# Patient Record
Sex: Male | Born: 1950
Health system: Southern US, Community
[De-identification: ages and names within clinical notes are randomized; demographics above are authoritative.]

## PROBLEM LIST (undated history)

## (undated) DIAGNOSIS — F0390 Unspecified dementia without behavioral disturbance: Secondary | ICD-10-CM

## (undated) DIAGNOSIS — F32A Depression, unspecified: Secondary | ICD-10-CM

## (undated) DIAGNOSIS — N390 Urinary tract infection, site not specified: Secondary | ICD-10-CM

## (undated) DIAGNOSIS — F419 Anxiety disorder, unspecified: Secondary | ICD-10-CM

## (undated) DIAGNOSIS — I639 Cerebral infarction, unspecified: Secondary | ICD-10-CM

## (undated) DIAGNOSIS — W19XXXA Unspecified fall, initial encounter: Secondary | ICD-10-CM

## (undated) DIAGNOSIS — G629 Polyneuropathy, unspecified: Secondary | ICD-10-CM

## (undated) DIAGNOSIS — R7303 Prediabetes: Secondary | ICD-10-CM

## (undated) DIAGNOSIS — I219 Acute myocardial infarction, unspecified: Secondary | ICD-10-CM

## (undated) DIAGNOSIS — F329 Major depressive disorder, single episode, unspecified: Secondary | ICD-10-CM

## (undated) DIAGNOSIS — M1990 Unspecified osteoarthritis, unspecified site: Secondary | ICD-10-CM

## (undated) DIAGNOSIS — I255 Ischemic cardiomyopathy: Secondary | ICD-10-CM

## (undated) DIAGNOSIS — T7840XA Allergy, unspecified, initial encounter: Secondary | ICD-10-CM

## (undated) DIAGNOSIS — I10 Essential (primary) hypertension: Secondary | ICD-10-CM

## (undated) DIAGNOSIS — R296 Repeated falls: Secondary | ICD-10-CM

## (undated) DIAGNOSIS — I739 Peripheral vascular disease, unspecified: Secondary | ICD-10-CM

## (undated) DIAGNOSIS — I5042 Chronic combined systolic (congestive) and diastolic (congestive) heart failure: Secondary | ICD-10-CM

## (undated) DIAGNOSIS — Z720 Tobacco use: Secondary | ICD-10-CM

## (undated) DIAGNOSIS — S065X9A Traumatic subdural hemorrhage with loss of consciousness of unspecified duration, initial encounter: Secondary | ICD-10-CM

## (undated) DIAGNOSIS — S065XAA Traumatic subdural hemorrhage with loss of consciousness status unknown, initial encounter: Secondary | ICD-10-CM

## (undated) DIAGNOSIS — I2510 Atherosclerotic heart disease of native coronary artery without angina pectoris: Secondary | ICD-10-CM

## (undated) HISTORY — DX: Atherosclerotic heart disease of native coronary artery without angina pectoris: I25.10

## (undated) HISTORY — PX: KNEE SURGERY: SHX244

## (undated) HISTORY — DX: Traumatic subdural hemorrhage with loss of consciousness status unknown, initial encounter: S06.5XAA

## (undated) HISTORY — DX: Urinary tract infection, site not specified: N39.0

## (undated) HISTORY — DX: Chronic combined systolic (congestive) and diastolic (congestive) heart failure: I50.42

## (undated) HISTORY — DX: Traumatic subdural hemorrhage with loss of consciousness of unspecified duration, initial encounter: S06.5X9A

## (undated) HISTORY — DX: Ischemic cardiomyopathy: I25.5

## (undated) HISTORY — DX: Prediabetes: R73.03

## (undated) HISTORY — PX: MOUTH SURGERY: SHX715

## (undated) HISTORY — PX: APPENDECTOMY: SHX54

## (undated) HISTORY — DX: Allergy, unspecified, initial encounter: T78.40XA

## (undated) HISTORY — PX: TOE SURGERY: SHX1073

## (undated) HISTORY — DX: Peripheral vascular disease, unspecified: I73.9

## (undated) HISTORY — DX: Acute myocardial infarction, unspecified: I21.9

## (undated) HISTORY — DX: Polyneuropathy, unspecified: G62.9

---
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
## 2008-10-19 ENCOUNTER — Emergency Department (HOSPITAL_BASED_OUTPATIENT_CLINIC_OR_DEPARTMENT_OTHER): Admission: EM | Admit: 2008-10-19 | Discharge: 2008-10-19 | Payer: Self-pay | Admitting: Emergency Medicine

## 2008-10-19 IMAGING — CR DG CHEST 2V
2 series · 2 of 2 positions shown · non-contrast
Comparison: None

CLINICAL DATA: Flu-like symptoms.

CHEST - 2 VIEW

[w chest pa]
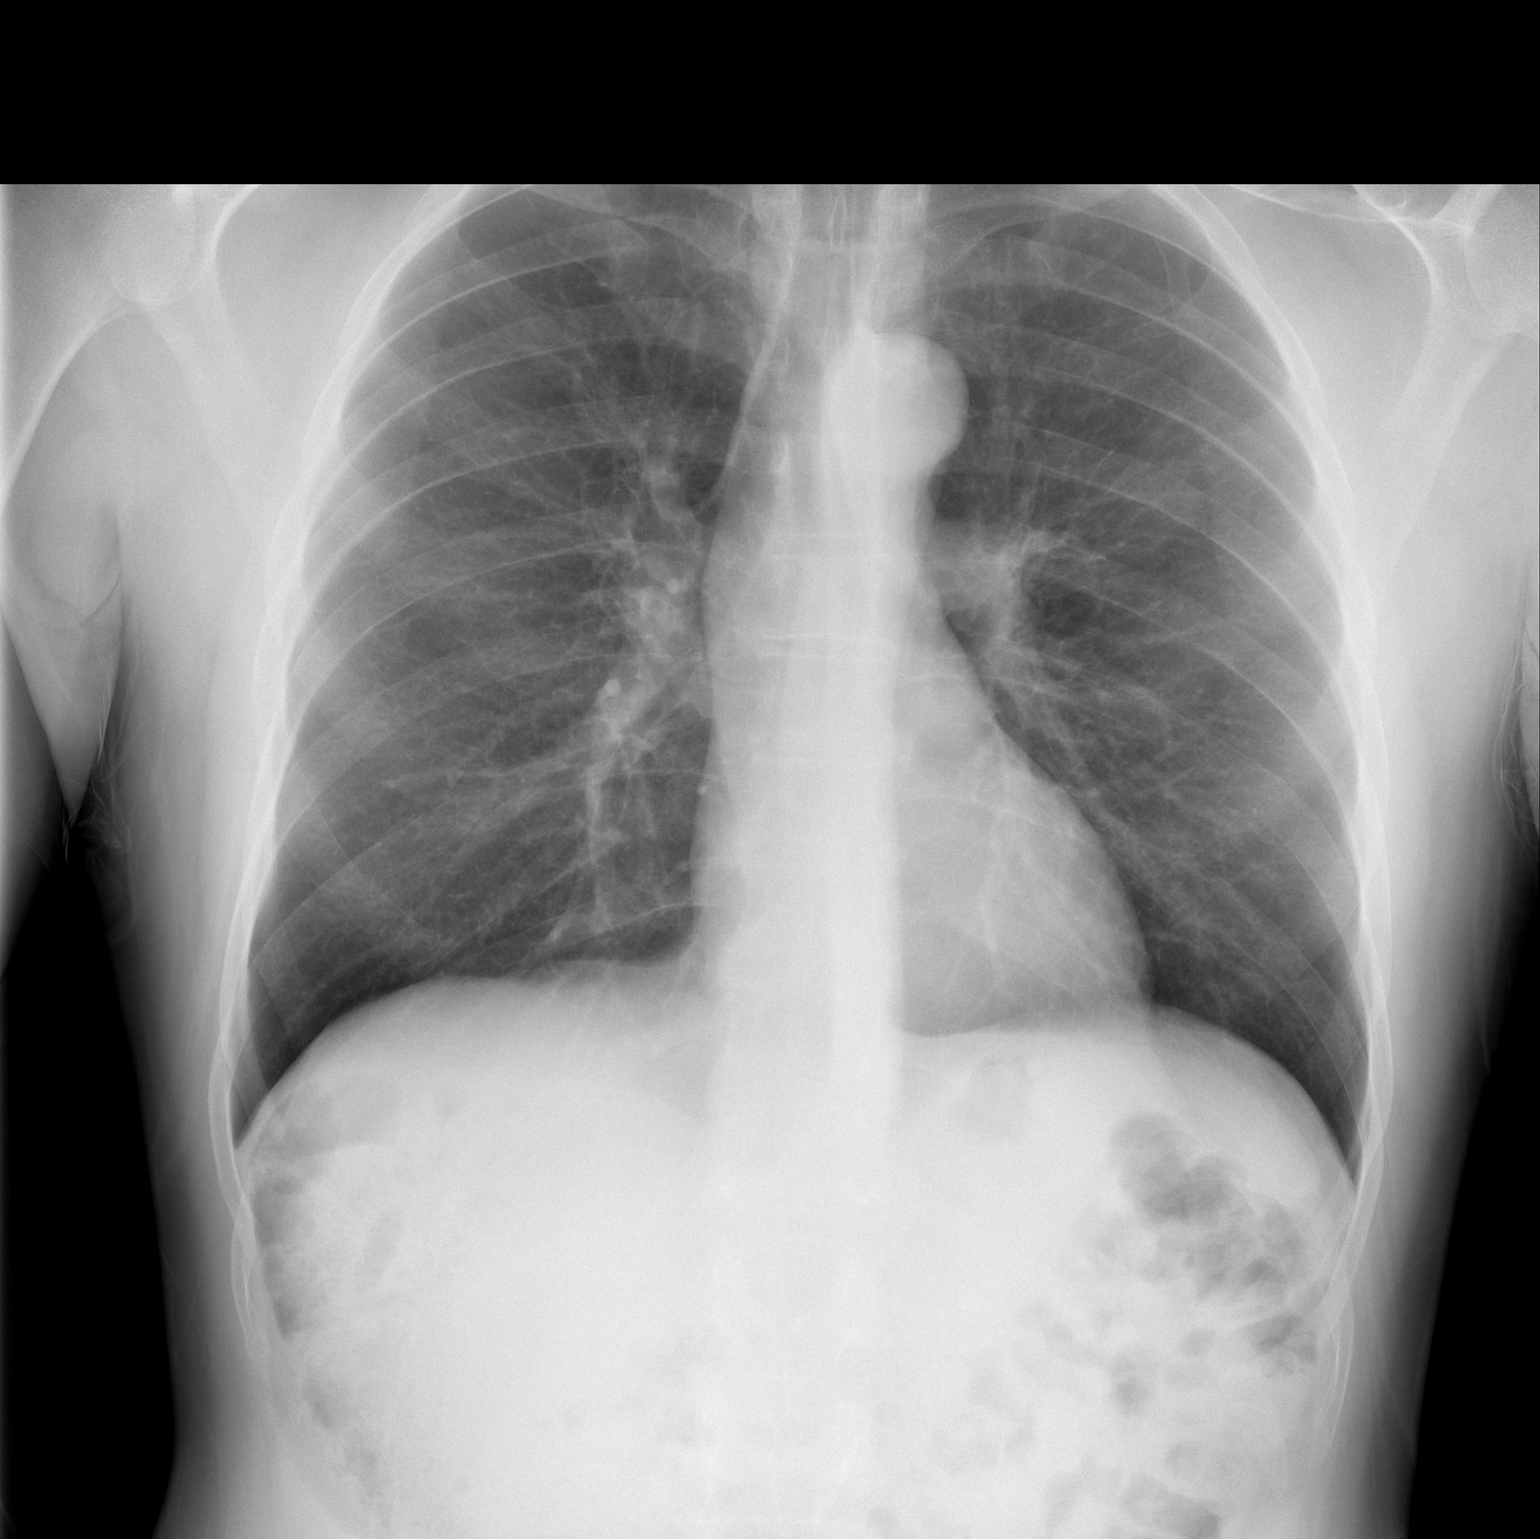

[w chest lat]
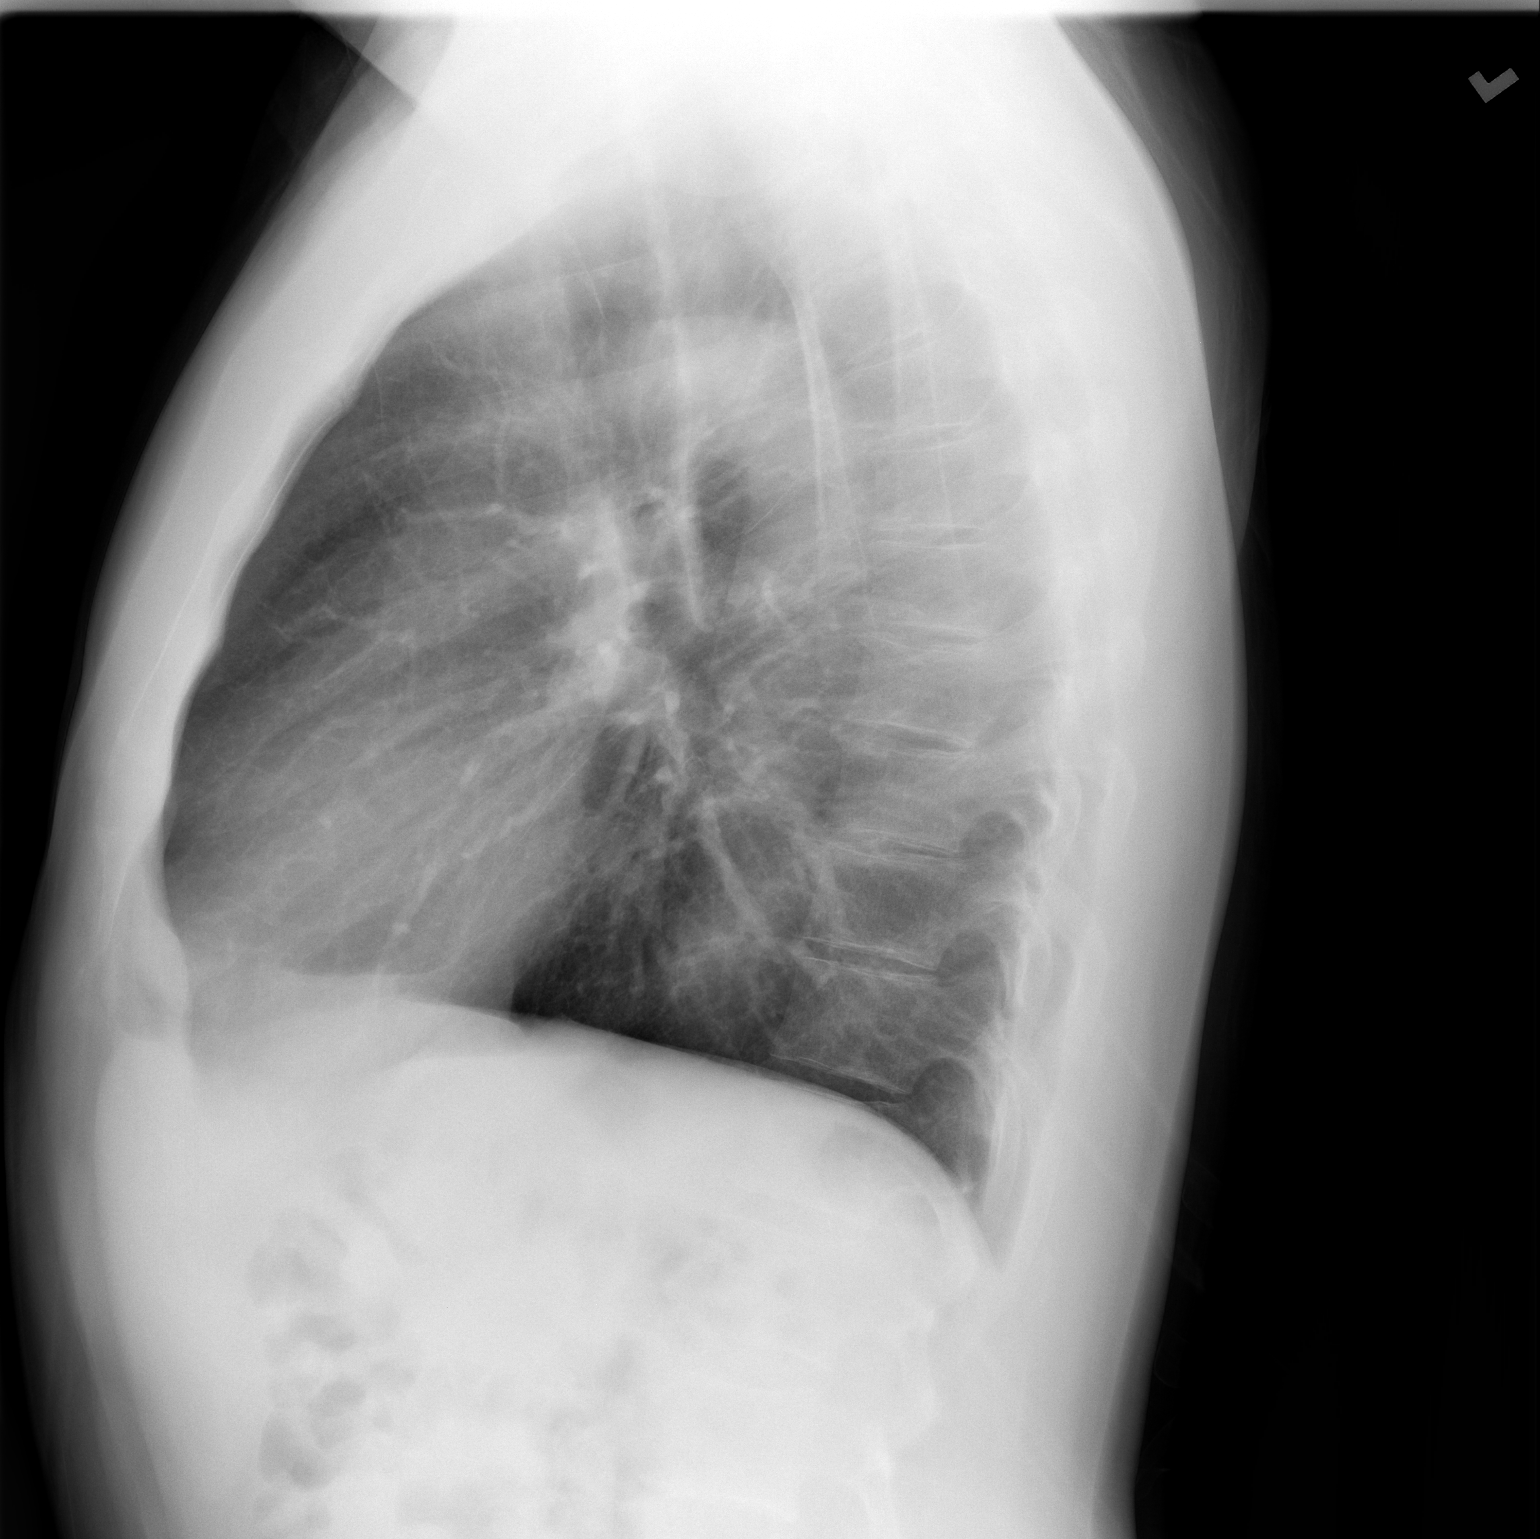

[2 of 2 positions shown; findings below may reference images not displayed]

FINDINGS: Heart and mediastinal contours are within normal limits.
No focal opacities or effusions.  No acute bony abnormality.
IMPRESSION: No active disease.

## 2010-01-25 ENCOUNTER — Encounter (INDEPENDENT_AMBULATORY_CARE_PROVIDER_SITE_OTHER): Payer: Self-pay | Admitting: Orthopedic Surgery

## 2010-01-25 ENCOUNTER — Ambulatory Visit: Payer: Self-pay | Admitting: Vascular Surgery

## 2010-01-25 ENCOUNTER — Ambulatory Visit (HOSPITAL_COMMUNITY): Admission: RE | Admit: 2010-01-25 | Discharge: 2010-01-25 | Payer: Self-pay | Admitting: Orthopedic Surgery

## 2013-10-29 ENCOUNTER — Emergency Department (HOSPITAL_COMMUNITY): Payer: 59

## 2013-10-29 ENCOUNTER — Ambulatory Visit (HOSPITAL_COMMUNITY): Admit: 2013-10-29 | Payer: Self-pay | Admitting: Cardiovascular Disease

## 2013-10-29 ENCOUNTER — Encounter (HOSPITAL_COMMUNITY): Payer: Self-pay | Admitting: Emergency Medicine

## 2013-10-29 ENCOUNTER — Encounter (HOSPITAL_COMMUNITY)
Admission: EM | Disposition: A | Discharge status: Home / Self Care | Payer: 59 | Source: Home / Self Care | Attending: Cardiovascular Disease

## 2013-10-29 ENCOUNTER — Inpatient Hospital Stay (HOSPITAL_COMMUNITY)
Admission: EM | Admit: 2013-10-29 | Discharge: 2013-11-08 | DRG: 234 | Disposition: A | Discharge status: Home / Self Care | Payer: 59 | Attending: Thoracic Surgery (Cardiothoracic Vascular Surgery) | Admitting: Thoracic Surgery (Cardiothoracic Vascular Surgery)

## 2013-10-29 DIAGNOSIS — Z79899 Other long term (current) drug therapy: Secondary | ICD-10-CM

## 2013-10-29 DIAGNOSIS — Z7982 Long term (current) use of aspirin: Secondary | ICD-10-CM

## 2013-10-29 DIAGNOSIS — D62 Acute posthemorrhagic anemia: Secondary | ICD-10-CM | POA: Diagnosis not present

## 2013-10-29 DIAGNOSIS — I10 Essential (primary) hypertension: Secondary | ICD-10-CM

## 2013-10-29 DIAGNOSIS — F17200 Nicotine dependence, unspecified, uncomplicated: Secondary | ICD-10-CM

## 2013-10-29 DIAGNOSIS — Z720 Tobacco use: Secondary | ICD-10-CM | POA: Diagnosis present

## 2013-10-29 DIAGNOSIS — I059 Rheumatic mitral valve disease, unspecified: Secondary | ICD-10-CM

## 2013-10-29 DIAGNOSIS — I2109 ST elevation (STEMI) myocardial infarction involving other coronary artery of anterior wall: Principal | ICD-10-CM

## 2013-10-29 DIAGNOSIS — I2510 Atherosclerotic heart disease of native coronary artery without angina pectoris: Secondary | ICD-10-CM

## 2013-10-29 DIAGNOSIS — I255 Ischemic cardiomyopathy: Secondary | ICD-10-CM

## 2013-10-29 DIAGNOSIS — Z951 Presence of aortocoronary bypass graft: Secondary | ICD-10-CM

## 2013-10-29 DIAGNOSIS — I213 ST elevation (STEMI) myocardial infarction of unspecified site: Secondary | ICD-10-CM

## 2013-10-29 HISTORY — DX: Essential (primary) hypertension: I10

## 2013-10-29 HISTORY — PX: LEFT HEART CATH: SHX5478

## 2013-10-29 HISTORY — DX: Tobacco use: Z72.0

## 2013-10-29 LAB — COMPREHENSIVE METABOLIC PANEL
ALT: 20 U/L (ref 0–53)
ALT: 17 U/L (ref 0–53)
AST: 35 U/L (ref 0–37)
AST: 37 U/L (ref 0–37)
Albumin: 4.2 g/dL (ref 3.5–5.2)
Albumin: 3.7 g/dL (ref 3.5–5.2)
Alkaline Phosphatase: 74 U/L (ref 39–117)
Alkaline Phosphatase: 68 U/L (ref 39–117)
BUN: 22 mg/dL (ref 6–23)
BUN: 21 mg/dL (ref 6–23)
CO2: 24 mEq/L (ref 19–32)
CO2: 25 mEq/L (ref 19–32)
Calcium: 9.6 mg/dL (ref 8.4–10.5)
Calcium: 9.4 mg/dL (ref 8.4–10.5)
Chloride: 103 mEq/L (ref 96–112)
Chloride: 101 mEq/L (ref 96–112)
Creatinine, Ser: 1.18 mg/dL (ref 0.50–1.35)
Creatinine, Ser: 1 mg/dL (ref 0.50–1.35)
GFR calc Af Amer: 76 mL/min — ABNORMAL LOW (ref >90)
GFR calc Af Amer: >90 mL/min (ref >90)
GFR calc non Af Amer: 65 mL/min — ABNORMAL LOW (ref >90)
GFR calc non Af Amer: 80 mL/min — ABNORMAL LOW (ref >90)
Glucose, Bld: 118 mg/dL — ABNORMAL HIGH (ref 70–99)
Glucose, Bld: 97 mg/dL (ref 70–99)
Potassium: 4.2 mEq/L (ref 3.5–5.1)
Potassium: 4.4 mEq/L (ref 3.5–5.1)
Sodium: 140 mEq/L (ref 135–145)
Sodium: 137 mEq/L (ref 135–145)
Total Bilirubin: 0.6 mg/dL (ref 0.3–1.2)
Total Bilirubin: 0.6 mg/dL (ref 0.3–1.2)
Total Protein: 7.7 g/dL (ref 6.0–8.3)
Total Protein: 7.1 g/dL (ref 6.0–8.3)

## 2013-10-29 LAB — POCT I-STAT, CHEM 8
BUN: 29 mg/dL — ABNORMAL HIGH (ref 6–23)
Calcium, Ion: 1.19 mmol/L (ref 1.13–1.30)
Chloride: 104 mEq/L (ref 96–112)
Creatinine, Ser: 1.3 mg/dL (ref 0.50–1.35)
Glucose, Bld: 114 mg/dL — ABNORMAL HIGH (ref 70–99)
HCT: 48 % (ref 39.0–52.0)
Hemoglobin: 16.3 g/dL (ref 13.0–17.0)
Potassium: 4.2 mEq/L (ref 3.5–5.1)
Sodium: 142 mEq/L (ref 135–145)
TCO2: 26 mmol/L (ref 0–100)

## 2013-10-29 LAB — CBC
HCT: 42.6 % (ref 39.0–52.0)
Hemoglobin: 14.5 g/dL (ref 13.0–17.0)
MCH: 28.3 pg (ref 26.0–34.0)
MCHC: 34 g/dL (ref 30.0–36.0)
MCV: 83.2 fL (ref 78.0–100.0)
Platelets: 280 10*3/uL (ref 150–400)
RBC: 5.12 MIL/uL (ref 4.22–5.81)
RDW: 13.7 % (ref 11.5–15.5)
WBC: 10.8 10*3/uL — ABNORMAL HIGH (ref 4.0–10.5)

## 2013-10-29 LAB — CBC WITH DIFFERENTIAL/PLATELET
Basophils Absolute: 0.1 10*3/uL (ref 0.0–0.1)
Basophils Relative: 1 % (ref 0–1)
Eosinophils Absolute: 0.3 10*3/uL (ref 0.0–0.7)
Eosinophils Relative: 2 % (ref 0–5)
HCT: 46.1 % (ref 39.0–52.0)
Hemoglobin: 15.6 g/dL (ref 13.0–17.0)
Lymphocytes Relative: 29 % (ref 12–46)
Lymphs Abs: 3.9 10*3/uL (ref 0.7–4.0)
MCH: 28.2 pg (ref 26.0–34.0)
MCHC: 33.8 g/dL (ref 30.0–36.0)
MCV: 83.2 fL (ref 78.0–100.0)
Monocytes Absolute: 0.7 10*3/uL (ref 0.1–1.0)
Monocytes Relative: 5 % (ref 3–12)
Neutro Abs: 8.5 10*3/uL — ABNORMAL HIGH (ref 1.7–7.7)
Neutrophils Relative %: 63 % (ref 43–77)
Platelets: 330 10*3/uL (ref 150–400)
RBC: 5.54 MIL/uL (ref 4.22–5.81)
RDW: 13.8 % (ref 11.5–15.5)
WBC: 13.4 10*3/uL — ABNORMAL HIGH (ref 4.0–10.5)

## 2013-10-29 LAB — PROTIME-INR
INR: 0.96 (ref 0.00–1.49)
INR: 1.27 (ref 0.00–1.49)
Prothrombin Time: 12.6 seconds (ref 11.6–15.2)
Prothrombin Time: 15.6 seconds — ABNORMAL HIGH (ref 11.6–15.2)

## 2013-10-29 LAB — MRSA PCR SCREENING: MRSA by PCR: NEGATIVE

## 2013-10-29 LAB — TROPONIN I
Troponin I: 1.91 ng/mL (ref <0.30)
Troponin I: 7.07 ng/mL (ref <0.30)
Troponin I: 6.75 ng/mL (ref <0.30)

## 2013-10-29 LAB — APTT: aPTT: 32 seconds (ref 24–37)

## 2013-10-29 LAB — POCT I-STAT TROPONIN I: Troponin i, poc: 0.5 ng/mL (ref 0.00–0.08)

## 2013-10-29 IMAGING — CR DG CHEST 1V PORT
2 series · 2 of 2 positions shown · non-contrast
Comparison: Chest radiograph performed [DATE]

CLINICAL DATA: Chest pain.

EXAM:
PORTABLE CHEST - 1 VIEW

[view not recorded (1 of 2)]
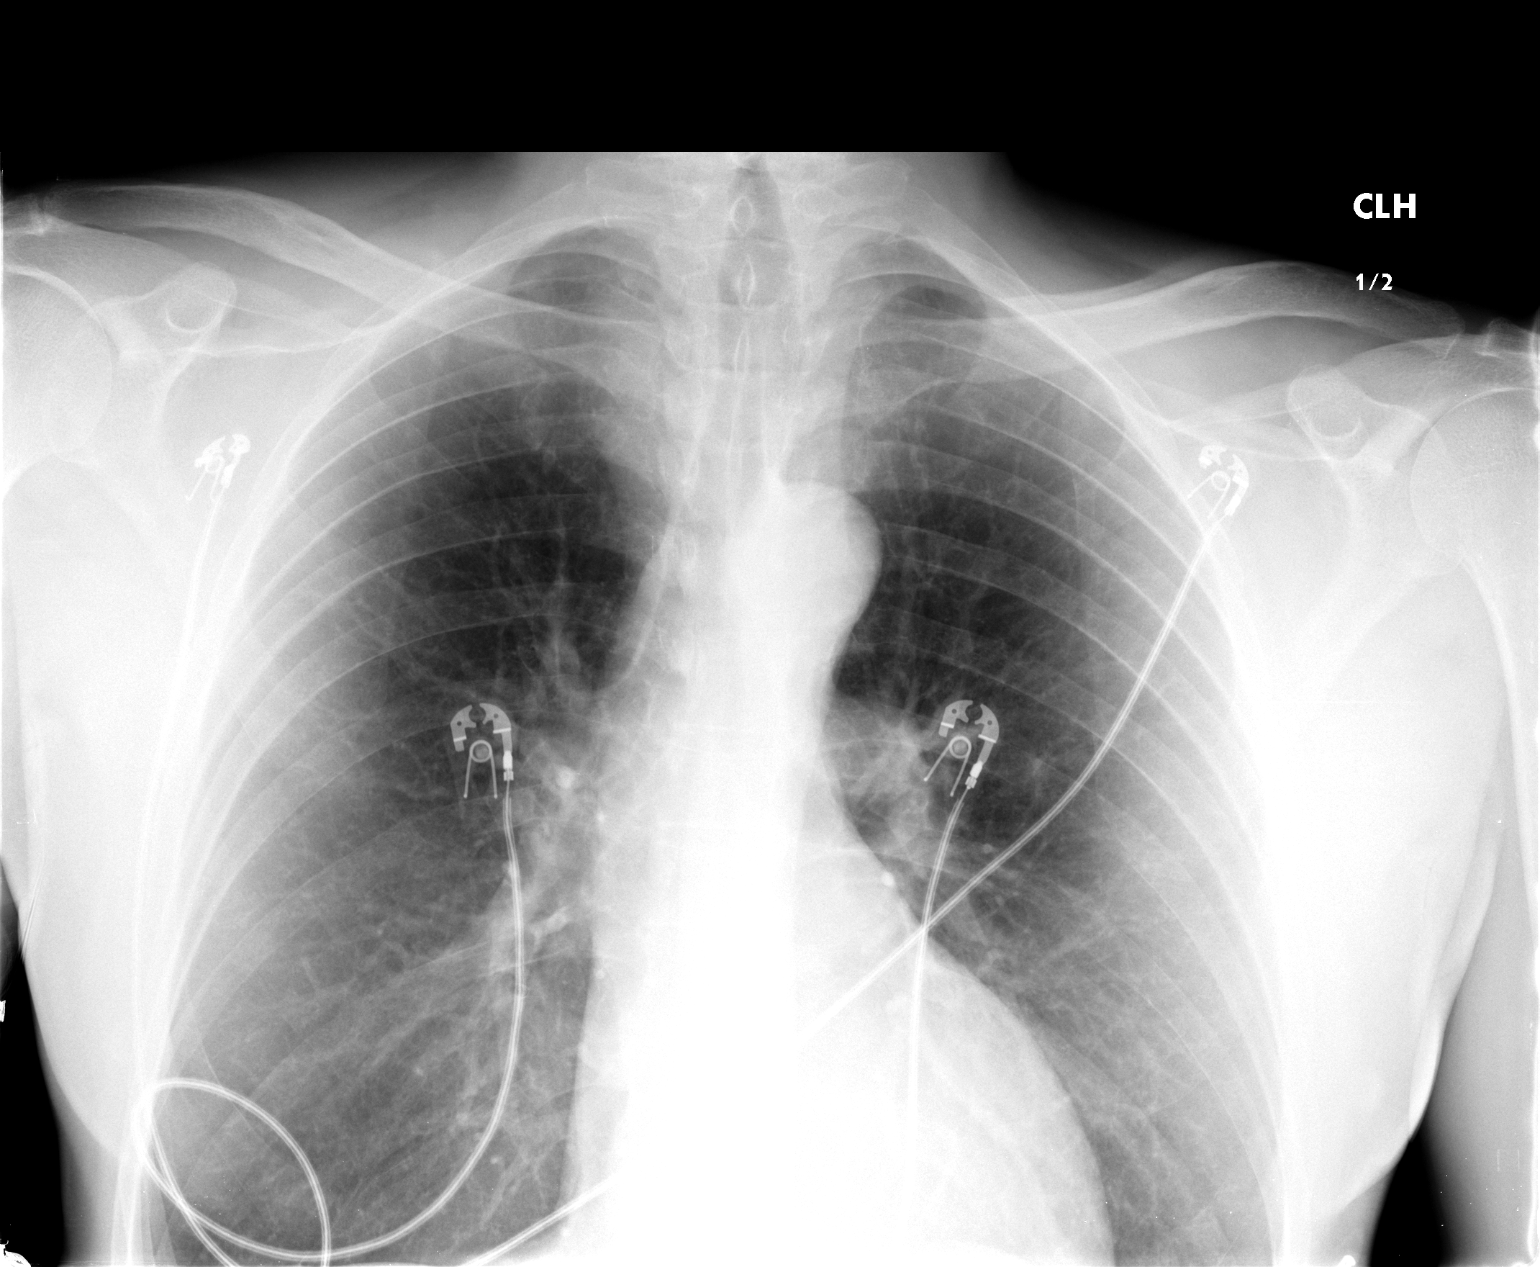

[view not recorded (2 of 2)]
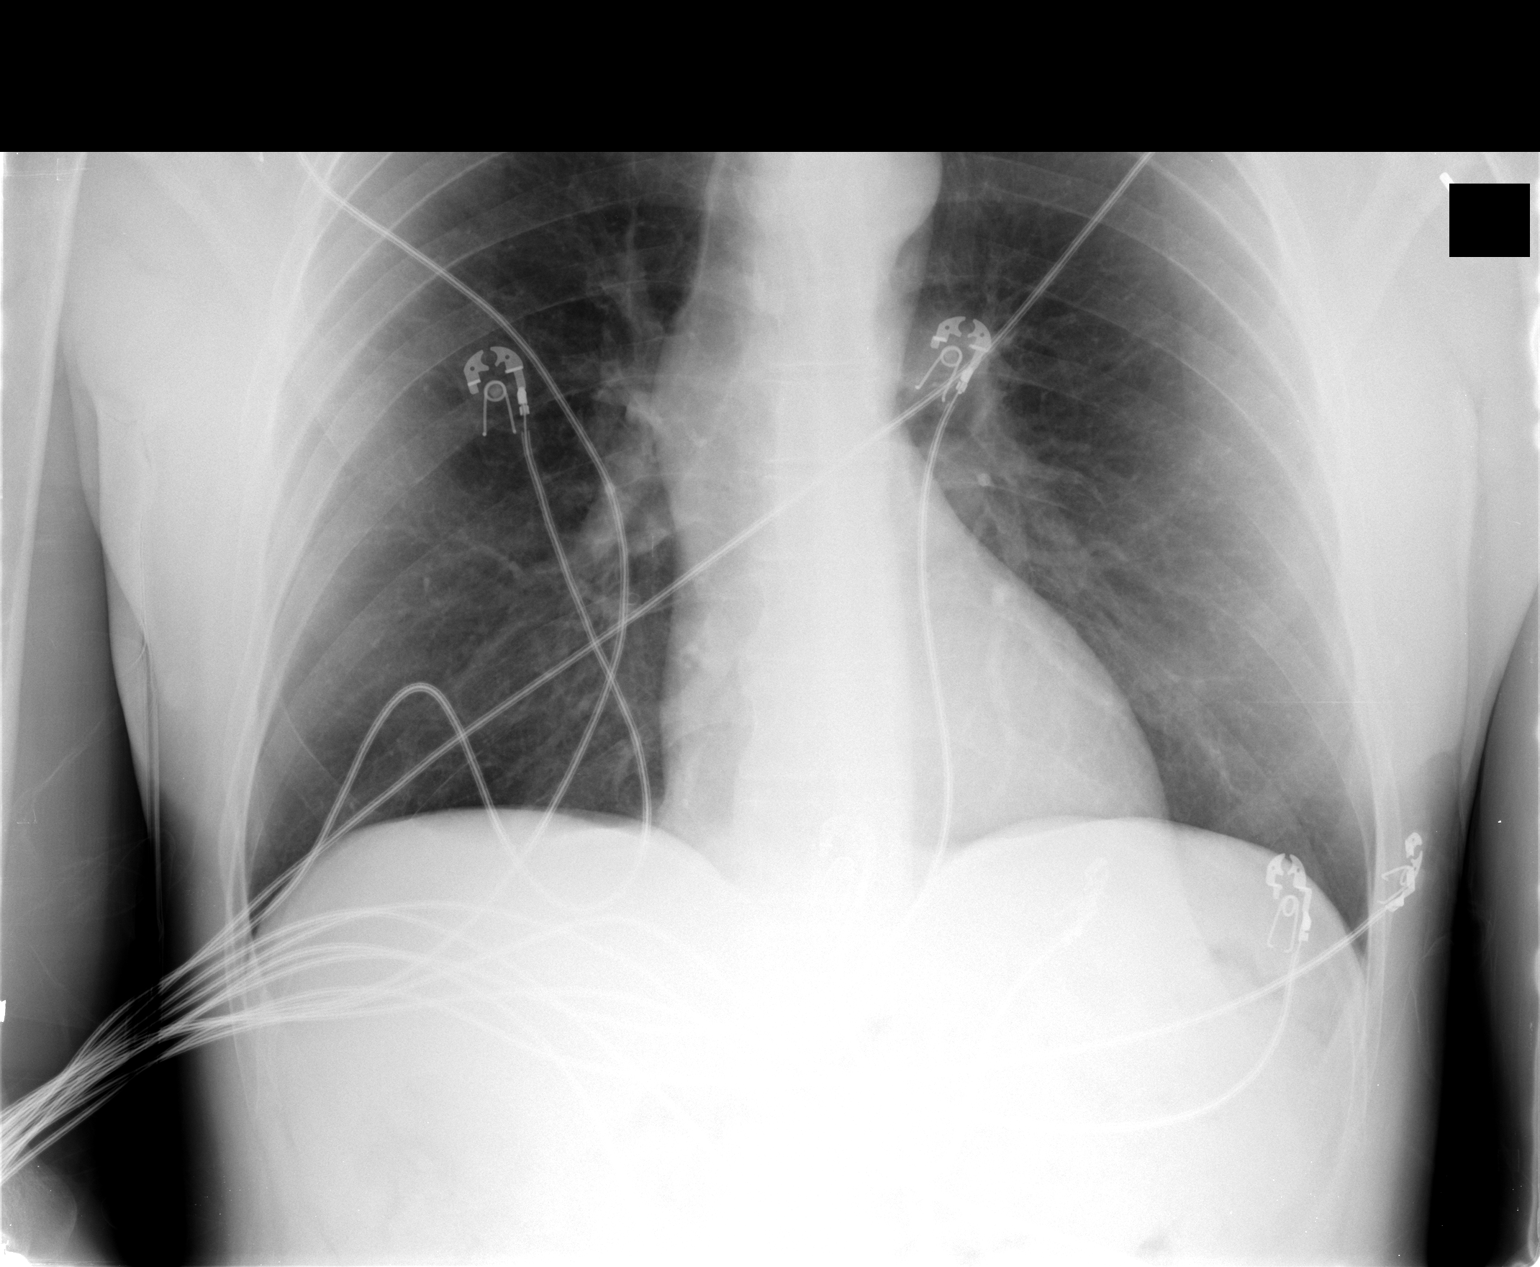

[2 of 2 positions shown; findings below may reference images not displayed]

FINDINGS: The lungs are well-aerated and clear. There is no evidence of focal
opacification, pleural effusion or pneumothorax.

The cardiomediastinal silhouette is within normal limits. No acute
osseous abnormalities are seen.
IMPRESSION: No acute cardiopulmonary process seen.

## 2013-10-29 SURGERY — LEFT HEART CATH
Anesthesia: LOCAL

## 2013-10-29 MED ORDER — HEPARIN SODIUM (PORCINE) 5000 UNIT/ML IJ SOLN
60.0000 [IU]/kg | INTRAMUSCULAR | Status: AC
  Administered 2013-10-29: 4800 [IU] via INTRAVENOUS

## 2013-10-29 MED ORDER — MIDAZOLAM HCL 2 MG/2ML IJ SOLN
INTRAMUSCULAR | Status: AC
  Filled 2013-10-29: qty 2

## 2013-10-29 MED ORDER — HEPARIN (PORCINE) IN NACL 2-0.9 UNIT/ML-% IJ SOLN
INTRAMUSCULAR | Status: AC
  Filled 2013-10-29: qty 1000

## 2013-10-29 MED ORDER — LIDOCAINE HCL (PF) 1 % IJ SOLN
INTRAMUSCULAR | Status: AC
  Filled 2013-10-29: qty 30

## 2013-10-29 MED ORDER — NITROGLYCERIN 0.4 MG SL SUBL
0.4000 mg | SUBLINGUAL_TABLET | SUBLINGUAL | Status: DC | PRN
  Administered 2013-10-31 (×3): 0.4 mg via SUBLINGUAL
  Filled 2013-10-29: qty 25

## 2013-10-29 MED ORDER — SODIUM CHLORIDE 0.9 % IV SOLN
INTRAVENOUS | Status: DC
  Administered 2013-10-29: 06:00:00 via INTRAVENOUS

## 2013-10-29 MED ORDER — FENTANYL CITRATE 0.05 MG/ML IJ SOLN
INTRAMUSCULAR | Status: AC
  Filled 2013-10-29: qty 2

## 2013-10-29 MED ORDER — ASPIRIN 81 MG PO CHEW
81.0000 mg | CHEWABLE_TABLET | Freq: Every day | ORAL | Status: DC
  Administered 2013-10-30 – 2013-11-04 (×6): 81 mg via ORAL
  Filled 2013-10-29 (×6): qty 1

## 2013-10-29 MED ORDER — NITROGLYCERIN IN D5W 200-5 MCG/ML-% IV SOLN
2.0000 ug/min | INTRAVENOUS | Status: DC
  Administered 2013-11-03: 5 ug/min via INTRAVENOUS
  Filled 2013-10-29: qty 250

## 2013-10-29 MED ORDER — MORPHINE SULFATE 2 MG/ML IJ SOLN
2.0000 mg | Freq: Once | INTRAMUSCULAR | Status: AC
  Administered 2013-10-29: 2 mg via INTRAVENOUS

## 2013-10-29 MED ORDER — ONDANSETRON HCL 4 MG/2ML IJ SOLN
4.0000 mg | Freq: Once | INTRAMUSCULAR | Status: AC
  Administered 2013-10-29: 4 mg via INTRAVENOUS

## 2013-10-29 MED ORDER — NITROGLYCERIN 0.2 MG/ML ON CALL CATH LAB
INTRAVENOUS | Status: AC
  Filled 2013-10-29: qty 1

## 2013-10-29 MED ORDER — ASPIRIN 81 MG PO CHEW
324.0000 mg | CHEWABLE_TABLET | Freq: Once | ORAL | Status: AC
  Administered 2013-10-29: 324 mg via ORAL

## 2013-10-29 MED ORDER — HEPARIN SODIUM (PORCINE) 1000 UNIT/ML IJ SOLN
INTRAMUSCULAR | Status: AC
  Filled 2013-10-29: qty 1

## 2013-10-29 MED ORDER — VERAPAMIL HCL 2.5 MG/ML IV SOLN
INTRAVENOUS | Status: AC
  Filled 2013-10-29: qty 2

## 2013-10-29 MED ORDER — ONDANSETRON HCL 4 MG/2ML IJ SOLN: 4.0000 mg | Freq: Four times a day (QID) | INTRAMUSCULAR | Status: DC | PRN

## 2013-10-29 MED ORDER — ACETAMINOPHEN 325 MG PO TABS: 650.0000 mg | ORAL_TABLET | ORAL | Status: DC | PRN

## 2013-10-29 MED ORDER — METOPROLOL TARTRATE 25 MG PO TABS
25.0000 mg | ORAL_TABLET | Freq: Two times a day (BID) | ORAL | Status: DC
  Filled 2013-10-29 (×2): qty 1

## 2013-10-29 MED ORDER — SODIUM CHLORIDE 0.9 % IV SOLN: INTRAVENOUS | Status: AC

## 2013-10-29 MED ORDER — MORPHINE SULFATE 2 MG/ML IJ SOLN: 2.0000 mg | INTRAMUSCULAR | Status: DC | PRN

## 2013-10-29 MED ORDER — CLOPIDOGREL BISULFATE 300 MG PO TABS
600.0000 mg | ORAL_TABLET | Freq: Once | ORAL | Status: AC
  Administered 2013-10-29: 600 mg via ORAL
  Filled 2013-10-29: qty 2

## 2013-10-29 MED ORDER — ATORVASTATIN CALCIUM 80 MG PO TABS
80.0000 mg | ORAL_TABLET | Freq: Every day | ORAL | Status: DC
  Administered 2013-10-29 – 2013-11-03 (×6): 80 mg via ORAL
  Filled 2013-10-29 (×7): qty 1

## 2013-10-29 MED ORDER — CLOPIDOGREL BISULFATE 75 MG PO TABS
75.0000 mg | ORAL_TABLET | Freq: Every day | ORAL | Status: DC
  Administered 2013-10-30 – 2013-10-31 (×2): 75 mg via ORAL
  Filled 2013-10-29 (×3): qty 1

## 2013-10-29 MED ORDER — NITROGLYCERIN IN D5W 200-5 MCG/ML-% IV SOLN
5.0000 ug/min | Freq: Once | INTRAVENOUS | Status: AC
  Administered 2013-10-29: 5 ug/min via INTRAVENOUS

## 2013-10-29 MED ORDER — BIVALIRUDIN 250 MG IV SOLR
INTRAVENOUS | Status: AC
  Filled 2013-10-29: qty 250

## 2013-10-29 MED ORDER — OXYCODONE-ACETAMINOPHEN 5-325 MG PO TABS
1.0000 | ORAL_TABLET | ORAL | Status: DC | PRN
  Administered 2013-10-29 – 2013-11-01 (×4): 2 via ORAL
  Filled 2013-10-29 (×5): qty 2

## 2013-10-29 NOTE — H&P (Signed)
     Patient ID: Lonnie [name] MRN: [id number] DOB/AGE: [birth year] 60 y.o. Admit date: 10/29/2013  Primary Care Physician: None Primary Cardiologist: None  HPI: 62 yo AAM with history of HTN and long time tobacco abuse with onset of chest pain this am at 5 am. Severe substernal pain, SOB. To ED and EKG with anterior ST elevation. Pt given NTG, ASA, Zofran and IV heparin. Chest pain now 1/10.   Review of systems complete and found to be negative unless listed above   Past Medical History  Diagnosis Date  . Hypertension   . Tobacco abuse     Family History  Problem Relation Age of Onset  . CAD Neg Hx   . Cardiomyopathy Daughter     History   Social History  . Marital Status: Widowed    Spouse Name: N/A    Number of Children: 1  . Years of Education: N/A   Occupational History  . Photographer   Social History Main Topics  . Smoking status: Current Every Day Smoker -- 1.00 packs/day for 42 years    Types: Cigarettes  . Smokeless tobacco: Not on file  . Alcohol Use: No  . Drug Use: No  . Sexual Activity: Not on file   Other Topics Concern  . Not on file   Social History Narrative  . No narrative on file    Past Surgical History  Procedure Laterality Date  . Toe surgery    . Appendectomy    . Knee surgery    . Mouth surgery      No Known Allergies  Prior to Admission Meds: BP med he cant remember but hasn't been taking  Physical Exam: Blood pressure 144/102, pulse 80, resp. rate 11, height 6\' 2"  (1.88 m), weight 175 lb (79.379 kg), SpO2 100.00%.   General: Well developed, well nourished, NAD  HEENT: OP clear, mucus membranes moist  SKIN: warm, dry. No rashes.  Neuro: No focal deficits  Musculoskeletal: Muscle strength 5/5 all ext  Psychiatric: Mood and affect normal  Neck: No JVD, no carotid bruits, no thyromegaly, no lymphadenopathy.  Lungs:Clear bilaterally, no wheezes, rhonci, crackles  Cardiovascular: Regular rate and rhythm. No murmurs,  gallops or rubs.  Abdomen:Soft. Bowel sounds present. Non-tender.  Extremities: No lower extremity edema. Pulses are 2 + in the bilateral DP/PT.   Labs:   Lab Results  Component Value Date   HGB 16.3 10/29/2013   HCT 48.0 10/29/2013     Recent Labs Lab 10/29/13 0606  NA 142  K 4.2  CL 104  BUN 29*  CREATININE 1.30  GLUCOSE 114*   EKG: Sinus, ST elevation anteriorly  ASSESSMENT AND PLAN:   1. Anterior STEMI: Plan for emergent cardiac cath with PCI. Further plans to follow.   Earney Hamburg, MD 10/29/2013, 6:33 AM

## 2013-10-29 NOTE — Progress Notes (Signed)
Pt reporting chest pain 2/10 that is similar in nature to the pain that brought him to ER. NTG increased to 18mcg/min. PA paged to report frequent runs of bigeminy, HR dropping to upper 40's, presence of CP.

## 2013-10-29 NOTE — ED Notes (Signed)
The pt has had mid-chest pain since 0500am.  No meds taken

## 2013-10-29 NOTE — Interval H&P Note (Signed)
History and Physical Interval Note:  10/29/2013 6:38 AM  Lonnie [name]  has presented today for cardiac cath with anterior STEMI. The various methods of treatment have been discussed with the patient and family. After consideration of risks, benefits and other options for treatment, the patient has consented to  Procedure(s): LEFT HEART CATH (N/A) as a surgical intervention .  The patient's history has been reviewed, patient examined, no change in status, stable for surgery.  I have reviewed the patient's chart and labs.  Questions were answered to the patient's satisfaction.    Cath Lab Visit (complete for each Cath Lab visit)  Clinical Evaluation Leading to the Procedure:   ACS: yes  Non-ACS:    Anginal Classification: CCS IV  Anti-ischemic medical therapy: No Therapy  Non-Invasive Test Results: No non-invasive testing performed  Prior CABG: No previous CABG        MCALHANY,CHRISTOPHER

## 2013-10-29 NOTE — ED Provider Notes (Signed)
CSN: [id number]     Arrival date & time 10/29/13  0544 History   First MD Initiated Contact with Patient 10/29/13 0559     Chief Complaint  Patient presents with  . Chest Pain   (Consider location/radiation/quality/duration/timing/severity/associated sxs/prior Treatment) HPI Comments: 62 year old male with a history of hypertension and a current smoker who presents with a complaint of chest pain. The pain is located in the mid sternum area, is a pressure and heaviness on his chest that started at 5:00 AM. This was acute in onset, persistent, nothing seems to make it better or worse, no associated shortness of breath, no radiation of the pain, no nausea vomiting or diaphoresis. He has no known cardiac history and states that he has not been taking his antihypertensive the last few days. He does work as an Buyer, retail but states that he always takes an aspirin and has not been having any shortness of breath or swelling of his legs.  Patient is a 62 y.o. male presenting with chest pain. The history is provided by the patient and the spouse.  Chest Pain   Past Medical History  Diagnosis Date  . Hypertension    History reviewed. No pertinent past surgical history. No family history on file. History  Substance Use Topics  . Smoking status: Current Every Day Smoker  . Smokeless tobacco: Not on file  . Alcohol Use: No    Review of Systems  Cardiovascular: Positive for chest pain.  All other systems reviewed and are negative.    Allergies  Review of patient's allergies indicates no known allergies.  Home Medications  No current outpatient prescriptions on file. Ht 6\' 2"  (1.88 m)  Wt 175 lb (79.379 kg)  BMI 22.46 kg/m2 Physical Exam  Nursing note and vitals reviewed. Constitutional: He appears well-developed and well-nourished. He appears distressed.  HENT:  Head: Normocephalic and atraumatic.  Mouth/Throat: Oropharynx is clear and moist. No oropharyngeal exudate.  Eyes:  Conjunctivae and EOM are normal. Pupils are equal, round, and reactive to light. Right eye exhibits no discharge. Left eye exhibits no discharge. No scleral icterus.  Neck: Normal range of motion. Neck supple. No JVD present. No thyromegaly present.  Cardiovascular: Normal rate, regular rhythm, normal heart sounds and intact distal pulses.  Exam reveals no gallop and no friction rub.   No murmur heard. Pulmonary/Chest: Effort normal and breath sounds normal. No respiratory distress. He has no wheezes. He has no rales.  Abdominal: Soft. Bowel sounds are normal. He exhibits no distension and no mass. There is no tenderness.  Musculoskeletal: Normal range of motion. He exhibits no edema and no tenderness.  Lymphadenopathy:    He has no cervical adenopathy.  Neurological: He is alert. Coordination normal.  Skin: Skin is warm and dry. No rash noted. No erythema.  Psychiatric: He has a normal mood and affect. His behavior is normal.    ED Course  Procedures (including critical care time) Labs Review Labs Reviewed  POCT I-STAT TROPONIN I - Abnormal; Notable for the following:    Troponin i, poc 0.50 (*)    All other components within normal limits  COMPREHENSIVE METABOLIC PANEL  CBC WITH DIFFERENTIAL  APTT  PROTIME-INR   Imaging Review No results found.  ED ECG REPORT  I personally interpreted this EKG   Date: 10/29/2013 05:48  Rate: 96  Rhythm: normal sinus rhythm  QRS Axis: normal  Intervals: normal  ST/T Wave abnormalities: ST elevations in leads V4 and V5, ST depressions inferior  Conduction Disutrbances:none  Narrative Interpretation:   Old EKG Reviewed: none available  0607 EKG Interpretation     Ventricular Rate:  91 PR Interval:  190 QRS Duration: 93 QT Interval:  391 QTC Calculation: 481 R Axis:   7 Text Interpretation:  Sinus rhythm Probable left atrial enlargement LVH with secondary repolarization abnormality Anterior infarct, old ST depr, consider ischemia,  inferior leads Artifact in lead(s) I III aVR aVL aVF            MDM   1. STEMI (ST elevation myocardial infarction)    The patient's EKG reveals ST elevations in leads V4 and V5 with Q waves V1 V2 V3 as well as ST depression in the inferior leads. With the patient's symptoms of a pressure in his substernal area and these EKG findings I have activated A. ST elevation MI protocol and have discussed the care with Dr. Clifton James of cardiology. He is on the way to see the pt at this time.    Nitroglycerin drip, heparin bolus with a drip, aspirin, chest x-ray, labs ordered.  The patient reexamined and has ongoing pain  Troponin is elevated at 0.5 consistent with myocardial infarction  Meds given in ED:  Medications  0.9 %  sodium chloride infusion ( Intravenous New Bag/Given 10/29/13 0611)  aspirin chewable tablet 324 mg (324 mg Oral Given 10/29/13 0611)  heparin injection 60 Units/kg (4,800 Units Intravenous Given 10/29/13 0603)  morphine 2 MG/ML injection 2 mg (2 mg Intravenous Given 10/29/13 0611)  ondansetron (ZOFRAN) injection 4 mg (4 mg Intravenous Given 10/29/13 0611)  nitroGLYCERIN 0.2 mg/mL in dextrose 5 % infusion (5 mcg/min Intravenous New Bag/Given 10/29/13 1610)     CRITICAL CARE Performed by: Eber Hong D Total critical care time: 30 Critical care time was exclusive of separately billable procedures and treating other patients. Critical care was necessary to treat or prevent imminent or life-threatening deterioration. Critical care was time spent personally by me on the following activities: development of treatment plan with patient and/or surrogate as well as nursing, discussions with consultants, evaluation of patient's response to treatment, examination of patient, obtaining history from patient or surrogate, ordering and performing treatments and interventions, ordering and review of laboratory studies, ordering and review of radiographic studies, pulse oximetry and  re-evaluation of patient's condition.    Vida Roller, MD 10/29/13 [phone]

## 2013-10-29 NOTE — CV Procedure (Addendum)
Cardiac Catheterization Operative Report  Lonnie [name] [id number] 11/8/20147:42 AM Provider Not In System  Procedure Performed:  1. Left Heart Catheterization 2. Selective Coronary Angiography 3. Left ventricular angiogram 4. Angioseal right femoral artery  Operator: Verne Carrow, MD  Indication: 62 yo male with history of HTN, tobacco abuse presented to ED with substernal chest pain x 1 hour. EKG with Q waves precordial leads with ST segment elevation in leads V3, V4, V5.                                     Procedure Details: The risks, benefits, complications, treatment options, and expected outcomes were discussed with the patient. The patient and/or family concurred with the proposed plan, giving informed consent. The patient was brought to the cath lab from the ED. The patient was further sedated with Versed and Fentanyl. I attempted access in the right radial but there was spasm and I could not engage the right radial artery.The right groin was prepped and draped in the usual manner. Using the modified Seldinger access technique, a 6 French sheath was placed in the right femoral artery. Standard diagnostic catheters were used to perform selective coronary angiography. The LAD was seen to have intense vasospasm in the mid segment. This resolved with IC NTG. The diagonal branch had 90% ostial disease (moderate caliber vessel) with TIMI-3 flow. There was no clear culprit for his presentation. The ostium of the diagonal branch has an aneurysmal segment just before the stenosis. It was unclear if there could possibly be a small occluded sub-branch of the diagonal. I thought it was worthwhile to explore this area further. The patient was chest pain free at this time. He was given a weight based bolus of Angiomax and a drip was started. The left main had been engaged with a XB LAD 3.5 guiding catheter. When the ACT was over 200, I passed a a Cougar IC wire into the diagonal  branch. I then passed a second Cougar IC wire into the LAD. I then attempted to use a Whisper wire to cross into the potential sub-branch but there was no lumen of an inferior branch nor any distal late filling to suggest an occluded vessel. I did not think stenting the diagonal branch would be the appropriate therapy for this patient. There was excellent flow down all vessels. A pigtail catheter was used to perform a left ventricular angiogram.  There were no immediate complications. The patient was taken to the recovery area in stable condition.   Hemodynamic Findings: Central aortic pressure: 117/64 Left ventricular pressure: 124/3/4  Angiographic Findings:  Left main: 50-60% distal stenosis.   Left Anterior Descending Artery: Large caliber vessel that courses to the apex. The proximal vessel has diffuse 40% stenosis. The mid vessel has diffuse 40% stenosis. The distal vessel has mild plaque disease. The first diagonal branch is small in caliber with 40% mid stenosis. The second diagonal branch is moderate in caliber with proximal aneurysmal segment followed by 80-90% stenosis. There is good flow down the second Diagonal branch. Of note, there was intense vasospasm of the proximal LAD, mid LAD and Diagonal branch noted during the catheterization which resolved with IC NTG.   Circumflex Artery: Moderate caliber vessel with moderate caliber intermediate branch. Mild plaque in the proximal segments of both vessels.   Right Coronary Artery: Moderate caliber dominant vessel with diffuse 60-70% mid stenosis, diffuse  40% distal stenosis.   Left Ventricular Angiogram: LVEF=60% with subtle hypokinesis of the anteroapical segment.   Impression: 1. Moderate non-obstructive CAD 2. Vasospasm of the LAD 3. Acute coronary syndrome secondary to vasospasm' 4. Preserved LV systolic function.   Recommendations: Will treat with ASA, statin, beta blocker. Will load with Plavix 600 mg po x 1 and start Plavix  75 mg po Qdaily. IV NTG today then convert to long acting nitrate tomorrow. Echo this weekend.        Complications:  None. The patient tolerated the procedure well.

## 2013-10-29 NOTE — ED Notes (Signed)
Cardiologist in room with pt 

## 2013-10-29 NOTE — ED Notes (Signed)
Pt states his pain started around 0500 this morning and awoke him from his sleep.  Pt stated his pain was a 7 upon arrival, was reduced to a 4 prior to giving 2mg  of morphine and is currently rated at a 1

## 2013-10-29 NOTE — Progress Notes (Signed)
Echocardiogram 2D Echocardiogram has been performed.  Dorothey Baseman 10/29/2013, 9:58 AM

## 2013-10-30 DIAGNOSIS — I2589 Other forms of chronic ischemic heart disease: Secondary | ICD-10-CM

## 2013-10-30 DIAGNOSIS — I219 Acute myocardial infarction, unspecified: Secondary | ICD-10-CM

## 2013-10-30 LAB — CBC
HCT: 41.5 % (ref 39.0–52.0)
Hemoglobin: 13.7 g/dL (ref 13.0–17.0)
MCH: 27.3 pg (ref 26.0–34.0)
MCHC: 33 g/dL (ref 30.0–36.0)
MCV: 82.7 fL (ref 78.0–100.0)
Platelets: 269 10*3/uL (ref 150–400)
RBC: 5.02 MIL/uL (ref 4.22–5.81)
RDW: 13.9 % (ref 11.5–15.5)
WBC: 9.2 10*3/uL (ref 4.0–10.5)

## 2013-10-30 LAB — LIPID PANEL
Cholesterol: 152 mg/dL (ref 0–200)
HDL: 43 mg/dL (ref >39)
LDL Cholesterol: 94 mg/dL (ref 0–99)
Total CHOL/HDL Ratio: 3.5 RATIO
Triglycerides: 76 mg/dL (ref <150)
VLDL: 15 mg/dL (ref 0–40)

## 2013-10-30 LAB — BASIC METABOLIC PANEL
BUN: 16 mg/dL (ref 6–23)
CO2: 22 mEq/L (ref 19–32)
Calcium: 9.1 mg/dL (ref 8.4–10.5)
Chloride: 105 mEq/L (ref 96–112)
Creatinine, Ser: 0.88 mg/dL (ref 0.50–1.35)
GFR calc Af Amer: >90 mL/min (ref >90)
GFR calc non Af Amer: >90 mL/min (ref >90)
Glucose, Bld: 94 mg/dL (ref 70–99)
Potassium: 3.8 mEq/L (ref 3.5–5.1)
Sodium: 140 mEq/L (ref 135–145)

## 2013-10-30 MED ORDER — ISOSORBIDE MONONITRATE ER 30 MG PO TB24
30.0000 mg | ORAL_TABLET | Freq: Every day | ORAL | Status: DC
  Administered 2013-10-30 – 2013-11-04 (×6): 30 mg via ORAL
  Filled 2013-10-30 (×6): qty 1

## 2013-10-30 MED ORDER — CARVEDILOL 3.125 MG PO TABS
3.1250 mg | ORAL_TABLET | Freq: Two times a day (BID) | ORAL | Status: DC
  Administered 2013-10-31 – 2013-11-04 (×9): 3.125 mg via ORAL
  Filled 2013-10-30 (×13): qty 1

## 2013-10-30 MED ORDER — LISINOPRIL 5 MG PO TABS
5.0000 mg | ORAL_TABLET | Freq: Every day | ORAL | Status: DC
  Administered 2013-10-30 – 2013-11-04 (×6): 5 mg via ORAL
  Filled 2013-10-30 (×6): qty 1

## 2013-10-30 NOTE — Progress Notes (Signed)
SUBJECTIVE: Pt had some left mid-axillary musculoskeletal pain, which he says is due to the position he slept in. He currently denies chest pain, shortness of breath, lightheadedness, and palpitations. EF 40-45%. Cath showed moderate nonobstructive disease with severe LAD and diagonal vasospasm. Currently on nitro drip. Beta blocker held due to bradycardia.     Intake/Output Summary (Last 24 hours) at 10/30/13 0903 Last data filed at 10/30/13 0800  Gross per 24 hour  Intake 1132.36 ml  Output   1700 ml  Net -567.64 ml    Current Facility-Administered Medications  Medication Dose Route Frequency Provider Last Rate Last Dose  . 0.9 %  sodium chloride infusion   Intravenous Continuous Vida Roller, MD 10 mL/hr at 10/29/13 1240 10 mL/hr at 10/29/13 1240  . acetaminophen (TYLENOL) tablet 650 mg  650 mg Oral Q4H PRN Kathleene Hazel, MD      . aspirin chewable tablet 81 mg  81 mg Oral Daily Kathleene Hazel, MD      . atorvastatin (LIPITOR) tablet 80 mg  80 mg Oral q1800 Kathleene Hazel, MD   80 mg at 10/29/13 1757  . clopidogrel (PLAVIX) tablet 75 mg  75 mg Oral Q breakfast Kathleene Hazel, MD   75 mg at 10/30/13 1610  . metoprolol tartrate (LOPRESSOR) tablet 25 mg  25 mg Oral BID Kathleene Hazel, MD      . morphine 2 MG/ML injection 2 mg  2 mg Intravenous Q1H PRN Kathleene Hazel, MD      . nitroGLYCERIN (NITROSTAT) SL tablet 0.4 mg  0.4 mg Sublingual Q5 Min x 3 PRN Kathleene Hazel, MD      . nitroGLYCERIN 0.2 mg/mL in dextrose 5 % infusion  2-200 mcg/min Intravenous Continuous Kathleene Hazel, MD 3 mL/hr at 10/30/13 0800 10 mcg/min at 10/30/13 0800  . ondansetron (ZOFRAN) injection 4 mg  4 mg Intravenous Q6H PRN Kathleene Hazel, MD      . oxyCODONE-acetaminophen (PERCOCET/ROXICET) 5-325 MG per tablet 1-2 tablet  1-2 tablet Oral Q4H PRN Kathleene Hazel, MD        Filed Vitals:   10/30/13 0500 10/30/13 0600  10/30/13 0700 10/30/13 0800  BP: 116/52 101/44 120/55 139/54  Pulse: 58 52 50 65  Temp:    98 F (36.7 C)  TempSrc:    Oral  Resp: 13 13 12 19   Height:      Weight:      SpO2: 100% 99% 99% 100%    PHYSICAL EXAM General: NAD Neck: No JVD, no thyromegaly or thyroid nodule.  Lungs: Clear to auscultation bilaterally with normal respiratory effort. CV: Nondisplaced PMI.  Regular rhythm, normal S1/S2, no S3/S4, no murmur.  No pretibial edema.  No carotid bruit.  Normal pedal pulses.  Abdomen: Soft, nontender, no hepatosplenomegaly, no distention.  Neurologic: Alert and oriented x 3.  Psych: Normal affect. Extremities: No clubbing or cyanosis.   TELEMETRY: Reviewed telemetry pt in normal sinus rhythm (ectopy and ventricular bigeminy noted on telemetry earlier)  LABS: Basic Metabolic Panel:  Recent Labs  96/04/54 0918 10/30/13 0420  NA 137 140  K 4.4 3.8  CL 101 105  CO2 25 22  GLUCOSE 97 94  BUN 21 16  CREATININE 1.00 0.88  CALCIUM 9.4 9.1   Liver Function Tests:  Recent Labs  10/29/13 0600 10/29/13 0918  AST 35 37  ALT 20 17  ALKPHOS 74 68  BILITOT 0.6 0.6  PROT 7.7 7.1  ALBUMIN 4.2 3.7   No results found for this basename: LIPASE, AMYLASE,  in the last 72 hours CBC:  Recent Labs  10/29/13 0600  10/29/13 0918 10/30/13 0420  WBC 13.4*  --  10.8* 9.2  NEUTROABS 8.5*  --   --   --   HGB 15.6  < > 14.5 13.7  HCT 46.1  < > 42.6 41.5  MCV 83.2  --  83.2 82.7  PLT 330  --  280 269  < > = values in this interval not displayed. Cardiac Enzymes:  Recent Labs  10/29/13 0918 10/29/13 1508 10/29/13 2109  TROPONINI 1.91* 7.07* 6.75*   BNP: No components found with this basename: POCBNP,  D-Dimer: No results found for this basename: DDIMER,  in the last 72 hours Hemoglobin A1C: No results found for this basename: HGBA1C,  in the last 72 hours Fasting Lipid Panel:  Recent Labs  10/30/13 0420  CHOL 152  HDL 43  LDLCALC 94  TRIG 76  CHOLHDL 3.5    Thyroid Function Tests: No results found for this basename: TSH, T4TOTAL, FREET3, T3FREE, THYROIDAB,  in the last 72 hours Anemia Panel: No results found for this basename: VITAMINB12, FOLATE, FERRITIN, TIBC, IRON, RETICCTPCT,  in the last 72 hours  RADIOLOGY: Dg Chest Port 1 View  10/29/2013   CLINICAL DATA:  Chest pain.  EXAM: PORTABLE CHEST - 1 VIEW  COMPARISON:  Chest radiograph performed 10/19/2008  FINDINGS: The lungs are well-aerated and clear. There is no evidence of focal opacification, pleural effusion or pneumothorax.  The cardiomediastinal silhouette is within normal limits. No acute osseous abnormalities are seen.  IMPRESSION: No acute cardiopulmonary process seen.   Electronically Signed   By: Roanna Raider M.D.   On: 10/29/2013 06:43   ECHO: Study Conclusions  - Left ventricle: The cavity size was normal. There was mild concentric hypertrophy. Systolic function was mildly to moderately reduced. The estimated ejection fraction was in the range of 40% to 45%. There is hypokinesis of the distalanterolateral myocardium. There is severe hypokinesis to akinesis of the mid-distal anteroseptal myocardium. There is severe hypokinesis toakinesis of the distal inferior myocardium. Doppler parameters are consistent with abnormal left ventricular relaxation (grade 1 diastolic dysfunction). - Aortic valve: Mildly calcified annulus. Trileaflet; mildly calcified leaflets. No significant regurgitation. - Mitral valve: Mild regurgitation. - Left atrium: The atrium was mildly dilated. - Right atrium: Central venous pressure: 3mm Hg (est). - Tricuspid valve: Trivial regurgitation. - Pulmonary arteries: PA peak pressure: 27mm Hg (S). - Pericardium, extracardiac: There was no pericardial effusion. Impressions:  - No prior study for comparison. Mild LVH with LVEF 40-45%, wall motion abnormalities as noted above. Grade 1 diastolic dysfunction with mild left atrial enlargement. Mild  mitral regurgitation. Trivial tricuspid regurgitation with PASP 27 mmHg. No pericardial effusion.     ASSESSMENT AND PLAN: 1. Acute coronary syndrome presumably due to severe LAD and diagonal vasospasm: his EF is reduced, 40-45%, with wall motion abnormalities as noted above. For this reason, I will switch metoprolol to low-dose carvedilol and initiate lisinopril 5 mg daily. Will start long-acting nitrate (Imdur) 30 mg daily, and wean off of nitro drip. Continue ASA, Plavix, and high-dose Lipitor. 2. HTN: pt wasn't taking his antihypertensive prior to hospitalization. The carvedilol and lisinopril I'm initiating for LV dysfunction will concomitantly treat HTN. 3. Tobacco abuse: cessation advised.   Prentice Docker, M.D., F.A.C.C.

## 2013-10-31 ENCOUNTER — Encounter (HOSPITAL_COMMUNITY): Payer: Self-pay | Admitting: Thoracic Surgery (Cardiothoracic Vascular Surgery)

## 2013-10-31 ENCOUNTER — Other Ambulatory Visit: Payer: Self-pay | Admitting: *Deleted

## 2013-10-31 ENCOUNTER — Encounter (HOSPITAL_COMMUNITY)
Admission: EM | Disposition: A | Discharge status: Home / Self Care | Payer: 59 | Source: Home / Self Care | Attending: Cardiovascular Disease

## 2013-10-31 DIAGNOSIS — I2510 Atherosclerotic heart disease of native coronary artery without angina pectoris: Secondary | ICD-10-CM

## 2013-10-31 DIAGNOSIS — I200 Unstable angina: Secondary | ICD-10-CM

## 2013-10-31 HISTORY — PX: LEFT HEART CATHETERIZATION WITH CORONARY ANGIOGRAM: SHX5451

## 2013-10-31 LAB — BASIC METABOLIC PANEL
BUN: 12 mg/dL (ref 6–23)
CO2: 23 mEq/L (ref 19–32)
Calcium: 9.4 mg/dL (ref 8.4–10.5)
Chloride: 103 mEq/L (ref 96–112)
Creatinine, Ser: 0.91 mg/dL (ref 0.50–1.35)
GFR calc Af Amer: >90 mL/min (ref >90)
GFR calc non Af Amer: >90 mL/min (ref >90)
Glucose, Bld: 101 mg/dL — ABNORMAL HIGH (ref 70–99)
Potassium: 4 mEq/L (ref 3.5–5.1)
Sodium: 138 mEq/L (ref 135–145)

## 2013-10-31 LAB — CBC
HCT: 42.5 % (ref 39.0–52.0)
Hemoglobin: 14.2 g/dL (ref 13.0–17.0)
MCH: 27.5 pg (ref 26.0–34.0)
MCHC: 33.4 g/dL (ref 30.0–36.0)
MCV: 82.4 fL (ref 78.0–100.0)
Platelets: 297 10*3/uL (ref 150–400)
RBC: 5.16 MIL/uL (ref 4.22–5.81)
RDW: 13.6 % (ref 11.5–15.5)
WBC: 7.6 10*3/uL (ref 4.0–10.5)

## 2013-10-31 LAB — POCT ACTIVATED CLOTTING TIME
Activated Clotting Time: 442 seconds
Activated Clotting Time: 319 seconds

## 2013-10-31 SURGERY — LEFT HEART CATHETERIZATION WITH CORONARY ANGIOGRAM
Anesthesia: LOCAL

## 2013-10-31 MED ORDER — BIVALIRUDIN 250 MG IV SOLR
INTRAVENOUS | Status: AC
  Filled 2013-10-31: qty 250

## 2013-10-31 MED ORDER — SODIUM CHLORIDE 0.9 % IJ SOLN: 3.0000 mL | INTRAMUSCULAR | Status: DC | PRN

## 2013-10-31 MED ORDER — MIDAZOLAM HCL 2 MG/2ML IJ SOLN
INTRAMUSCULAR | Status: AC
  Filled 2013-10-31: qty 2

## 2013-10-31 MED ORDER — FENTANYL CITRATE 0.05 MG/ML IJ SOLN
INTRAMUSCULAR | Status: AC
  Filled 2013-10-31: qty 2

## 2013-10-31 MED ORDER — HEPARIN (PORCINE) IN NACL 2-0.9 UNIT/ML-% IJ SOLN
INTRAMUSCULAR | Status: AC
  Filled 2013-10-31: qty 1000

## 2013-10-31 MED ORDER — SODIUM CHLORIDE 0.9 % IJ SOLN
3.0000 mL | Freq: Two times a day (BID) | INTRAMUSCULAR | Status: DC
  Administered 2013-10-31: 3 mL via INTRAVENOUS

## 2013-10-31 MED ORDER — NITROGLYCERIN 0.2 MG/ML ON CALL CATH LAB
INTRAVENOUS | Status: AC
  Filled 2013-10-31: qty 1

## 2013-10-31 MED ORDER — SODIUM CHLORIDE 0.9 % IV SOLN: INTRAVENOUS | Status: AC

## 2013-10-31 MED ORDER — HEPARIN (PORCINE) IN NACL 100-0.45 UNIT/ML-% IJ SOLN
1300.0000 [IU]/h | INTRAMUSCULAR | Status: DC
  Administered 2013-10-31: 1100 [IU]/h via INTRAVENOUS
  Filled 2013-10-31 (×2): qty 250

## 2013-10-31 MED ORDER — SODIUM CHLORIDE 0.9 % IV SOLN
1.0000 mL/kg/h | INTRAVENOUS | Status: DC
  Administered 2013-10-31: 1 mL/kg/h via INTRAVENOUS

## 2013-10-31 MED ORDER — LIDOCAINE HCL (PF) 1 % IJ SOLN
INTRAMUSCULAR | Status: AC
  Filled 2013-10-31: qty 30

## 2013-10-31 MED ORDER — SODIUM CHLORIDE 0.9 % IV SOLN: 250.0000 mL | INTRAVENOUS | Status: DC | PRN

## 2013-10-31 MED FILL — Sodium Chloride IV Soln 0.9%: INTRAVENOUS | Qty: 50 | Status: AC

## 2013-10-31 NOTE — Interval H&P Note (Signed)
History and Physical Interval Note:  10/31/2013 10:41 AM  Lonnie [name]  has presented today for re-look cardiac cath with the diagnosis of cp, nstemi, CAD.   The various methods of treatment have been discussed with the patient and family. After consideration of risks, benefits and other options for treatment, the patient has consented to  Procedure(s): LEFT HEART CATHETERIZATION WITH CORONARY ANGIOGRAM (N/A) as a surgical intervention .  The patient's history has been reviewed, patient examined, no change in status, stable for surgery.  I have reviewed the patient's chart and labs.  Questions were answered to the patient's satisfaction.    Cath Lab Visit (complete for each Cath Lab visit)  Clinical Evaluation Leading to the Procedure:   ACS: yes  Non-ACS:    Anginal Classification: CCS IV  Anti-ischemic medical therapy: Maximal Therapy (2 or more classes of medications)  Non-Invasive Test Results: No non-invasive testing performed  Prior CABG: No previous CABG        MCALHANY,CHRISTOPHER

## 2013-10-31 NOTE — CV Procedure (Signed)
Cardiac Catheterization Operative Report  Lonnie [name] [id number] 11/10/201411:42 AM Provider Not In System  Procedure Performed:   1. Selective Coronary Angiography 2. IVUS LAD/Left main artery  Operator: Verne Carrow, MD  Indication: 62 yo male with history of HTN, tobacco abuse admitted 10/29/13 with acute coronary syndrome/anterior STEMI. Emergent cath 10/29/13 and pt found to have moderate disease in the left main, LAD, RCA and severe stenosis ostium of Diagonal branch. There was intense spasm of the LAD during the case, mostly resolving with IC NTG. There was no clear culprit for his acute presentation although it was felt that his vasospasm in presence of moderate left main and LAD disease could have caused his symptoms. It as not felt that his Diagonal disease would cause this. He has been managed over the weekend with ASA, Plavix, IV NTG, beta blocker, statin. He has had multiple episodes of recurrent chest pain over the weekend. Plans for relook cath with IVUS of left main and LAD.                                   Procedure Details: The risks, benefits, complications, treatment options, and expected outcomes were discussed with the patient. The patient and/or family concurred with the proposed plan, giving informed consent. The patient was brought to the cath lab after IV hydration was begun and oral premedication was given. The patient was further sedated with Versed and Fentanyl. The right groin was prepped and draped in the usual manner. Using the modified Seldinger access technique, a 6 French sheath was placed in the right femoral artery. A JR4 was used to engage the RCA. A XB LAD 3.5 guiding catheter was used to engage the left main. Angiography of the left system revealed at least moderate distal left main stenosis, moderate proximal and mid LAD stenosis, severe ostial Diagonal disease, moderately severe mid RCA stenosis. He was given a weight based bolus of  Angiomax and a drip was started. When the ACT was greater than 200, I passed a Cougar IC wire into the LAD and another Cougar IC wire into the Diagonal branch. I then passed the IVUS catheter over the LAD wire and performed an automated IVUS pullback from the mid LAD back into the mid left main. He was found to have circumferential plaque in the entire proximal LAD with severe eccentric plaque in the distal left main artery. The wire and guide were removed.   IVUS Data: (see images in paper chart under cardiology tab). Distal left main with MLA of 5.5 mm2. This suggests that the eccentric lesion seen angiographically is severe and likely flow limiting. Moderate circumferential plaque in entire proximal LAD.   There were no immediate complications. The patient was taken to the recovery area in stable condition.   Hemodynamic Findings: Central aortic pressure: 110/69  Angiographic Findings:  Left main: Eccentric 60% stenosis. (Best seen in the RAO view)   Left Anterior Descending Artery: Large caliber vessel that courses to the apex. The proximal vessel has diffuse 40% stenosis. The mid vessel has diffuse 40% stenosis. The distal vessel has mild plaque disease. The first diagonal branch is small in caliber with 40% mid stenosis. The second diagonal branch is moderate in caliber with proximal aneurysmal segment followed by 80-90% stenosis.   Circumflex Artery: Moderate caliber vessel with moderate caliber intermediate branch. Mild plaque in the proximal segments of both vessels.  Right Coronary Artery: Moderate caliber dominant vessel with diffuse 60-70% mid stenosis, diffuse 40% distal stenosis.   Impression: 1. NSTEMI with severe, eccentric distal left main stenosis, moderately severe RCA stenosis, moderate LAD stenosis, severe Diagonal stenosis. The left main stenosis is best seen in the RAO view. IVUS confirms severe left main plaque with eccentric narrowing in the distal segment. His  presentation, EKG changes and enzyme elevation are not felt to be consistent with the Diagonal lesion alone.  Recommendations: Very difficult situation. Initial presentation as anterior STEMI on am of 10/29/13. Emergent cath with intense vasospasm in entire LAD, left main. He was found to have severe stenosis in the Diagonal ostium but this was not felt to explain his symptoms or findings at time of presentation. He has been treated medically over the weekend with ASA, Plavix, beta blocker, statin and IV NTG but has continued to have episodes of chest pain and EKG changes suggesting evolution of an acute infarct across the anterior leads. I have reviewed his films carefully with my interventional colleagues. His distal left main stenosis is concerning and this is confirmed with IVUS. Will stop Plavix and ask CT surgery to evaluate for possible CABG. Will start IV heparin 8 hours post sheath pull.        Complications:  None. The patient tolerated the procedure well.

## 2013-10-31 NOTE — Progress Notes (Signed)
RFA sheath removed without complications. Pressure to site x 25 min. Site level zero. Rt PT palpable. Pt teaching done. Bedrest begins  At 1420.

## 2013-10-31 NOTE — Progress Notes (Signed)
SUBJECTIVE:  He complains dull chest pain this morning that subsided with SL nitro x3. He has no increased shortness of breath, abdominal pain, nausea, arm tingling or numbness, or back pain.    Intake/Output Summary (Last 24 hours) at 10/31/13 0733 Last data filed at 10/30/13 2000  Gross per 24 hour  Intake 1470.48 ml  Output   1425 ml  Net  45.48 ml    Current Facility-Administered Medications  Medication Dose Route Frequency Provider Last Rate Last Dose  . 0.9 %  sodium chloride infusion   Intravenous Continuous Vida Roller, MD   10 mL/hr at 10/29/13 1240  . acetaminophen (TYLENOL) tablet 650 mg  650 mg Oral Q4H PRN Kathleene Hazel, MD      . aspirin chewable tablet 81 mg  81 mg Oral Daily Kathleene Hazel, MD   81 mg at 10/30/13 0924  . atorvastatin (LIPITOR) tablet 80 mg  80 mg Oral q1800 Kathleene Hazel, MD   80 mg at 10/30/13 1835  . carvedilol (COREG) tablet 3.125 mg  3.125 mg Oral BID WC Laqueta Linden, MD      . clopidogrel (PLAVIX) tablet 75 mg  75 mg Oral Q breakfast Kathleene Hazel, MD   75 mg at 10/30/13 1610  . isosorbide mononitrate (IMDUR) 24 hr tablet 30 mg  30 mg Oral Daily Laqueta Linden, MD   30 mg at 10/30/13 1028  . lisinopril (PRINIVIL,ZESTRIL) tablet 5 mg  5 mg Oral Daily Laqueta Linden, MD   5 mg at 10/30/13 1028  . morphine 2 MG/ML injection 2 mg  2 mg Intravenous Q1H PRN Kathleene Hazel, MD      . nitroGLYCERIN (NITROSTAT) SL tablet 0.4 mg  0.4 mg Sublingual Q5 Min x 3 PRN Kathleene Hazel, MD   0.4 mg at 10/31/13 0714  . nitroGLYCERIN 0.2 mg/mL in dextrose 5 % infusion  2-200 mcg/min Intravenous Continuous Kathleene Hazel, MD   5 mcg/min at 10/30/13 1148  . ondansetron (ZOFRAN) injection 4 mg  4 mg Intravenous Q6H PRN Kathleene Hazel, MD      . oxyCODONE-acetaminophen (PERCOCET/ROXICET) 5-325 MG per tablet 1-2 tablet  1-2 tablet Oral Q4H PRN Kathleene Hazel, MD        Filed Vitals:    10/31/13 0300 10/31/13 0400 10/31/13 0500 10/31/13 0600  BP: 131/86 138/68 130/81 123/63  Pulse:      Temp:  97.6 F (36.4 C)    TempSrc:  Oral    Resp:      Height:      Weight:      SpO2:        PHYSICAL EXAM General: NAD Neck: No JVD.   Lungs: Clear to auscultation bilaterally, no respiratory distress. . CV: Nondisplaced PMI.  Regular rhythm, normal S1/S2, no S3/S4, no murmur.  No pretibial edema.  No carotid bruit.  Pedal pulses 2+ and equal bilaterally.   Abdomen: Soft, nontender, no hepatosplenomegaly, no distention.  Neurologic: Alert and oriented x 3.  Psych: Normal affect. Extremities: No clubbing or cyanosis. No edema.   Telemetry: Normal sinus rhythm   LABS: Basic Metabolic Panel:  Recent Labs  96/04/54 0918 10/30/13 0420  NA 137 140  K 4.4 3.8  CL 101 105  CO2 25 22  GLUCOSE 97 94  BUN 21 16  CREATININE 1.00 0.88  CALCIUM 9.4 9.1   Liver Function Tests:  Recent Labs  10/29/13 0600 10/29/13 0918  AST 35  37  ALT 20 17  ALKPHOS 74 68  BILITOT 0.6 0.6  PROT 7.7 7.1  ALBUMIN 4.2 3.7   No results found for this basename: LIPASE, AMYLASE,  in the last 72 hours CBC:  Recent Labs  10/29/13 0600  10/29/13 0918 10/30/13 0420  WBC 13.4*  --  10.8* 9.2  NEUTROABS 8.5*  --   --   --   HGB 15.6  < > 14.5 13.7  HCT 46.1  < > 42.6 41.5  MCV 83.2  --  83.2 82.7  PLT 330  --  280 269  < > = values in this interval not displayed. Cardiac Enzymes:  Recent Labs  10/29/13 0918 10/29/13 1508 10/29/13 2109  TROPONINI 1.91* 7.07* 6.75*   BNP: No components found with this basename: POCBNP,  D-Dimer: No results found for this basename: DDIMER,  in the last 72 hours Hemoglobin A1C: No results found for this basename: HGBA1C,  in the last 72 hours Fasting Lipid Panel:  Recent Labs  10/30/13 0420  CHOL 152  HDL 43  LDLCALC 94  TRIG 76  CHOLHDL 3.5   Thyroid Function Tests: No results found for this basename: TSH, T4TOTAL, FREET3, T3FREE,  THYROIDAB,  in the last 72 hours Anemia Panel: No results found for this basename: VITAMINB12, FOLATE, FERRITIN, TIBC, IRON, RETICCTPCT,  in the last 72 hours  RADIOLOGY: Dg Chest Port 1 View  10/29/2013   CLINICAL DATA:  Chest pain.  EXAM: PORTABLE CHEST - 1 VIEW  COMPARISON:  Chest radiograph performed 10/19/2008  FINDINGS: The lungs are well-aerated and clear. There is no evidence of focal opacification, pleural effusion or pneumothorax.  The cardiomediastinal silhouette is within normal limits. No acute osseous abnormalities are seen.  IMPRESSION: No acute cardiopulmonary process seen.   Electronically Signed   By: Roanna Raider M.D.   On: 10/29/2013 06:43   ECHO: Study Conclusions  - Left ventricle: The cavity size was normal. There was mild concentric hypertrophy. Systolic function was mildly to moderately reduced. The estimated ejection fraction was in the range of 40% to 45%. There is hypokinesis of the distalanterolateral myocardium. There is severe hypokinesis to akinesis of the mid-distal anteroseptal myocardium. There is severe hypokinesis toakinesis of the distal inferior myocardium. Doppler parameters are consistent with abnormal left ventricular relaxation (grade 1 diastolic dysfunction). - Aortic valve: Mildly calcified annulus. Trileaflet; mildly calcified leaflets. No significant regurgitation. - Mitral valve: Mild regurgitation. - Left atrium: The atrium was mildly dilated. - Right atrium: Central venous pressure: 3mm Hg (est). - Tricuspid valve: Trivial regurgitation. - Pulmonary arteries: PA peak pressure: 27mm Hg (S). - Pericardium, extracardiac: There was no pericardial effusion. Impressions:  - No prior study for comparison. Mild LVH with LVEF 40-45%, wall motion abnormalities as noted above. Grade 1 diastolic dysfunction with mild left atrial enlargement. Mild mitral regurgitation. Trivial tricuspid regurgitation with PASP 27 mmHg. No pericardial  effusion.     ASSESSMENT AND PLAN: 1. Acute coronary syndrome: presumably due to severe LAD and diagonal vasospasm as his EF is reduced, 40-45%, with wall motion abnormalities as noted above. He tolerated low-dose carvedilol initiate lisinopril 5 mg daily as well as Imdur 30 mg daily which were initiated yesterday. Continue ASA, Plavix, and high-dose Lipitor. Given his persistent chest pain and unchanged EKG with ST elevation, will proceed with angiogram today.   2. HTN: BP well controlled overnight. He wasn't taking his antihypertensive prior to hospitalization. The carvedilol and lisinopril I'm initiating for LV dysfunction will concomitantly  treat HTN. 3. Tobacco abuse: cessation advised.    Signed,   Ky Barban M.D, [facility], PGY-II  Patient seen and examined and history reviewed. Agree with above findings and plan. Patient experienced recurrent chest pain this am. Pain waxed and waned in intensity. Relieved with sl Ntg. Pain similar to pain that brought him to hospital. Exam is benign. Ecg shows persistent ST elevation in V2-3 with deep T wave inversion in the anterior leads. I personally reviewed his prior cath films and discussed with Dr. Clifton James. Patient has diffuse disease in the proximal LAD with a component of spasm. There is a tight lesion at the origin of the first diagonal. Given his recurrent chest pain I think we should repeat coronary angiography today. Consider evaluation of proximal LAD with IVUS vs. Flow wire. If LAD is OK consider PCI of the diagonal. IV Ntg resumed. The procedure and risks were reviewed including but not limited to death, myocardial infarction, stroke, arrythmias, bleeding, transfusion, emergency surgery, dye allergy, or renal dysfunction. The patient voices understanding and is agreeable to proceed.    Theron Arista [facility] 10/31/2013 8:17 AM

## 2013-10-31 NOTE — Consult Note (Signed)
Reason for Consult:Left main disease, unstable coronary syndrome Referring Physician: Dr. McAlhany  Lonnie [name] is an 62 y.o. male.  HPI: 62 man with a history of tobacco abuse and hypertension presented 10/8 with a cc/o CP  Lonnie [name] is a 62 yo gentleman with no prior cardiac history. He does have a history of hypertension and smokes about 1/2 ppd. 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 He was in his usual state of health until Saturday morning when he had the abrupt onset of substernal CP at ~5 AM. He described this as a substernal pressure. There was no SOB, diaphoresis, nausea or vomitting. He came to the ED where he was noted to have ST elevation. He was taken emergently to the catheterization laboratory where he was found to have a tight diagonal stenosis. He also had spasm in the LAD. There was plaque in the left main and RCA as well.  He did rule in with a troponin of 7. He was loaded with plavix.  He was treated medically initially, but had recurrent angina this AM. Re-look cath was done, including an IVUS of the left main. The left main stenosis was determined to be significant(>60%) by IVUS. He currently is pain free.  Past Medical History  Diagnosis Date  . Hypertension   . Tobacco abuse   . Anginal pain     Past Surgical History  Procedure Laterality Date  . Toe surgery    . Appendectomy    . Knee surgery      fractured patella  . Mouth surgery      Family History  Problem Relation Age of Onset  . CAD Father 69  . Cardiomyopathy Daughter     Social History:  reports that he has been smoking Cigarettes.  He has a 30 pack-year smoking history. He does not have any smokeless tobacco history on file. He reports that he does not drink alcohol or use illicit drugs.  Allergies:  Allergies  Allergen Reactions  . Dairy Aid [Lactase]   . Eggs Or Egg-Derived Products   . Peanut-Containing Drug Products     Does not know which nuts, but states nuts make  him vomit    Medications:  Scheduled: . aspirin  81 mg Oral Daily  . atorvastatin  80 mg Oral q1800  . carvedilol  3.125 mg Oral BID WC  . isosorbide mononitrate  30 mg Oral Daily  . lisinopril  5 mg Oral Daily    Results for orders placed during the hospital encounter of 10/29/13 (from the past 48 hour(s))  TROPONIN I     Status: Abnormal   Collection Time    10/29/13  3:08 PM      Result Value Range   Troponin I 7.07 (*) <0.30 ng/mL   Comment:            Due to the release kinetics of cTnI,     a negative result within the first hours     of the onset of symptoms does not rule out     myocardial infarction with certainty.     If myocardial infarction is still suspected,     repeat the test at appropriate intervals.     REPEATED TO VERIFY     CRITICAL VALUE NOTED.  VALUE IS CONSISTENT WITH PREVIOUSLY REPORTED AND CALLED VALUE.  TROPONIN I     Status: Abnormal   Collection Time    10/29/13  9:09 PM        Result Value Range   Troponin I 6.75 (*) <0.30 ng/mL   Comment:            Due to the release kinetics of cTnI,     a negative result within the first hours     of the onset of symptoms does not rule out     myocardial infarction with certainty.     If myocardial infarction is still suspected,     repeat the test at appropriate intervals.     REPEATED TO VERIFY     CRITICAL VALUE NOTED.  VALUE IS CONSISTENT WITH PREVIOUSLY REPORTED AND CALLED VALUE.  BASIC METABOLIC PANEL     Status: None   Collection Time    10/30/13  4:20 AM      Result Value Range   Sodium 140  135 - 145 mEq/L   Potassium 3.8  3.5 - 5.1 mEq/L   Chloride 105  96 - 112 mEq/L   CO2 22  19 - 32 mEq/L   Glucose, Bld 94  70 - 99 mg/dL   BUN 16  6 - 23 mg/dL   Creatinine, Ser 0.88  0.50 - 1.35 mg/dL   Calcium 9.1  8.4 - 10.5 mg/dL   GFR calc non Af Amer >90  >90 mL/min   GFR calc Af Amer >90  >90 mL/min   Comment: (NOTE)     The eGFR has been calculated using the CKD EPI equation.     This  calculation has not been validated in all clinical situations.     eGFR's persistently <90 mL/min signify possible Chronic Kidney     Disease.  CBC     Status: None   Collection Time    10/30/13  4:20 AM      Result Value Range   WBC 9.2  4.0 - 10.5 K/uL   RBC 5.02  4.22 - 5.81 MIL/uL   Hemoglobin 13.7  13.0 - 17.0 g/dL   HCT 41.5  39.0 - 52.0 %   MCV 82.7  78.0 - 100.0 fL   MCH 27.3  26.0 - 34.0 pg   MCHC 33.0  30.0 - 36.0 g/dL   RDW 13.9  11.5 - 15.5 %   Platelets 269  150 - 400 K/uL  LIPID PANEL     Status: None   Collection Time    10/30/13  4:20 AM      Result Value Range   Cholesterol 152  0 - 200 mg/dL   Triglycerides 76  <150 mg/dL   HDL 43  >39 mg/dL   Total CHOL/HDL Ratio 3.5     VLDL 15  0 - 40 mg/dL   LDL Cholesterol 94  0 - 99 mg/dL   Comment:            Total Cholesterol/HDL:CHD Risk     Coronary Heart Disease Risk Table                         Men   Women      1/2 Average Risk   3.4   3.3      Average Risk       5.0   4.4      2 X Average Risk   9.6   7.1      3 X Average Risk  23.4   11.0                Use the calculated   Patient Ratio     above and the CHD Risk Table     to determine the patient's CHD Risk.                ATP III CLASSIFICATION (LDL):      <100     mg/dL   Optimal      100-129  mg/dL   Near or Above                        Optimal      130-159  mg/dL   Borderline      160-189  mg/dL   High      >190     mg/dL   Very High  BASIC METABOLIC PANEL     Status: Abnormal   Collection Time    10/31/13  8:30 AM      Result Value Range   Sodium 138  135 - 145 mEq/L   Potassium 4.0  3.5 - 5.1 mEq/L   Chloride 103  96 - 112 mEq/L   CO2 23  19 - 32 mEq/L   Glucose, Bld 101 (*) 70 - 99 mg/dL   BUN 12  6 - 23 mg/dL   Creatinine, Ser 0.91  0.50 - 1.35 mg/dL   Calcium 9.4  8.4 - 10.5 mg/dL   GFR calc non Af Amer >90  >90 mL/min   GFR calc Af Amer >90  >90 mL/min   Comment: (NOTE)     The eGFR has been calculated using the CKD EPI equation.      This calculation has not been validated in all clinical situations.     eGFR's persistently <90 mL/min signify possible Chronic Kidney     Disease.  CBC     Status: None   Collection Time    10/31/13  8:30 AM      Result Value Range   WBC 7.6  4.0 - 10.5 K/uL   RBC 5.16  4.22 - 5.81 MIL/uL   Hemoglobin 14.2  13.0 - 17.0 g/dL   HCT 42.5  39.0 - 52.0 %   MCV 82.4  78.0 - 100.0 fL   MCH 27.5  26.0 - 34.0 pg   MCHC 33.4  30.0 - 36.0 g/dL   RDW 13.6  11.5 - 15.5 %   Platelets 297  150 - 400 K/uL    No results found.  Review of Systems  Constitutional: Positive for malaise/fatigue. Negative for fever.  Respiratory: Negative.   Cardiovascular: Positive for chest pain. Negative for palpitations, orthopnea, claudication and leg swelling.  Gastrointestinal: Negative.   Neurological: Negative.   Endo/Heme/Allergies: Negative.   Psychiatric/Behavioral:       Has been under a great deal of stress over past year  All other systems reviewed and are negative.   Blood pressure 120/66, pulse 49, temperature 97.6 F (36.4 C), temperature source Oral, resp. rate 17, height 6' 2" (1.88 m), weight 165 lb 9.1 oz (75.1 kg), SpO2 100.00%. Physical Exam  Vitals reviewed. Constitutional: He is oriented to person, place, and time. He appears well-developed and well-nourished. No distress.  HENT:  Head: Normocephalic and atraumatic.  Eyes: EOM are normal. Pupils are equal, round, and reactive to light.  Neck: Neck supple. Erythema (no carotid bruits) present. No thyromegaly present.  Cardiovascular: Normal rate, regular rhythm, normal heart sounds and intact distal pulses.  Exam reveals no gallop and no friction rub.   No murmur heard. Respiratory: Effort   normal and breath sounds normal. He has no wheezes. He has no rales.  GI: Soft. There is no tenderness.  Musculoskeletal: He exhibits no edema.  Lymphadenopathy:    He has no cervical adenopathy.  Neurological: He is alert and oriented to  person, place, and time. No cranial nerve deficit.  No focal deficits  Skin: Skin is warm and dry.    Assessment/Plan: 62 yo male with an unstable coronary syndrome/ STEMI who has significant left main disease demonstrated by IVUS. CABG is indicated for survival benefit and relief of symptoms.  I discussed with the patient the general nature of the procedure, the need for general anesthesia, and the incisions to be used. We discussed the expected hospital stay, overall recovery and short and long term outcomes. He understands the risks include, but are not limited to, death, stroke, MI, DVT/PE, bleeding, possible need for transfusion, infections, cardiac arrhythmias and other organ system dysfunction including respiratory, renal, or GI complications. He accepts the risks and agrees to proceed.  He was loaded with plavix, but given his recurrent CP this AM, I'm reluctant to wait a full 7 days prior to CABG. Will check P2Y12 in AM and hopefully can proceed with CABG later this week.  Mosie Angus C 10/31/2013, 1:24 PM      

## 2013-10-31 NOTE — Progress Notes (Signed)
Shift Summary:  No acute events this shift.  Patient has been afebrile; VSS.  Complaints of chest pain managed with sublingual nitro per order; interventions were effective.  Patient reports pain ranges from a 1/10 intermittent, lasting a few minutes at a time to 0.5/10.  Patient has been ambulating independently in bedroom this shift; activity tolerated well.  Updated on plan of care.  Will continue to monitor.

## 2013-10-31 NOTE — Care Management Note (Signed)
    Page 1 of 1   10/31/2013     11:49:39 AM   CARE MANAGEMENT NOTE 10/31/2013  Patient:  Lonnie [name], Lonnie [name]   Account Number:  [id number]  Date Initiated:  10/31/2013  Documentation initiated by:  Junius Creamer  Subjective/Objective Assessment:   adm w mi     Action/Plan:   lives w fam   Anticipated DC Date:     Anticipated DC Plan:        DC Planning Services  CM consult      Choice offered to / List presented to:             Status of service:   Medicare Important Message given?   (If response is "NO", the following Medicare IM given date fields will be blank) Date Medicare IM given:   Date Additional Medicare IM given:    Discharge Disposition:    Per UR Regulation:  Reviewed for med. necessity/level of care/duration of stay  If discussed at Long Length of Stay Meetings, dates discussed:    Comments:

## 2013-10-31 NOTE — H&P (View-Only) (Signed)
SUBJECTIVE:  He complains dull chest pain this morning that subsided with SL nitro x3. He has no increased shortness of breath, abdominal pain, nausea, arm tingling or numbness, or back pain.    Intake/Output Summary (Last 24 hours) at 10/31/13 0733 Last data filed at 10/30/13 2000  Gross per 24 hour  Intake 1470.48 ml  Output   1425 ml  Net  45.48 ml    Current Facility-Administered Medications  Medication Dose Route Frequency Provider Last Rate Last Dose  . 0.9 %  sodium chloride infusion   Intravenous Continuous Brian D Miller, MD   10 mL/hr at 10/29/13 1240  . acetaminophen (TYLENOL) tablet 650 mg  650 mg Oral Q4H PRN Christopher D McAlhany, MD      . aspirin chewable tablet 81 mg  81 mg Oral Daily Christopher D McAlhany, MD   81 mg at 10/30/13 0924  . atorvastatin (LIPITOR) tablet 80 mg  80 mg Oral q1800 Christopher D McAlhany, MD   80 mg at 10/30/13 1835  . carvedilol (COREG) tablet 3.125 mg  3.125 mg Oral BID WC Suresh A Koneswaran, MD      . clopidogrel (PLAVIX) tablet 75 mg  75 mg Oral Q breakfast Christopher D McAlhany, MD   75 mg at 10/30/13 0838  . isosorbide mononitrate (IMDUR) 24 hr tablet 30 mg  30 mg Oral Daily Suresh A Koneswaran, MD   30 mg at 10/30/13 1028  . lisinopril (PRINIVIL,ZESTRIL) tablet 5 mg  5 mg Oral Daily Suresh A Koneswaran, MD   5 mg at 10/30/13 1028  . morphine 2 MG/ML injection 2 mg  2 mg Intravenous Q1H PRN Christopher D McAlhany, MD      . nitroGLYCERIN (NITROSTAT) SL tablet 0.4 mg  0.4 mg Sublingual Q5 Min x 3 PRN Christopher D McAlhany, MD   0.4 mg at 10/31/13 0714  . nitroGLYCERIN 0.2 mg/mL in dextrose 5 % infusion  2-200 mcg/min Intravenous Continuous Christopher D McAlhany, MD   5 mcg/min at 10/30/13 1148  . ondansetron (ZOFRAN) injection 4 mg  4 mg Intravenous Q6H PRN Christopher D McAlhany, MD      . oxyCODONE-acetaminophen (PERCOCET/ROXICET) 5-325 MG per tablet 1-2 tablet  1-2 tablet Oral Q4H PRN Christopher D McAlhany, MD        Filed Vitals:    10/31/13 0300 10/31/13 0400 10/31/13 0500 10/31/13 0600  BP: 131/86 138/68 130/81 123/63  Pulse:      Temp:  97.6 F (36.4 C)    TempSrc:  Oral    Resp:      Height:      Weight:      SpO2:        PHYSICAL EXAM General: NAD Neck: No JVD.   Lungs: Clear to auscultation bilaterally, no respiratory distress. . CV: Nondisplaced PMI.  Regular rhythm, normal S1/S2, no S3/S4, no murmur.  No pretibial edema.  No carotid bruit.  Pedal pulses 2+ and equal bilaterally.   Abdomen: Soft, nontender, no hepatosplenomegaly, no distention.  Neurologic: Alert and oriented x 3.  Psych: Normal affect. Extremities: No clubbing or cyanosis. No edema.   Telemetry: Normal sinus rhythm   LABS: Basic Metabolic Panel:  Recent Labs  10/29/13 0918 10/30/13 0420  NA 137 140  K 4.4 3.8  CL 101 105  CO2 25 22  GLUCOSE 97 94  BUN 21 16  CREATININE 1.00 0.88  CALCIUM 9.4 9.1   Liver Function Tests:  Recent Labs  10/29/13 0600 10/29/13 0918  AST 35   37  ALT 20 17  ALKPHOS 74 68  BILITOT 0.6 0.6  PROT 7.7 7.1  ALBUMIN 4.2 3.7   No results found for this basename: LIPASE, AMYLASE,  in the last 72 hours CBC:  Recent Labs  10/29/13 0600  10/29/13 0918 10/30/13 0420  WBC 13.4*  --  10.8* 9.2  NEUTROABS 8.5*  --   --   --   HGB 15.6  < > 14.5 13.7  HCT 46.1  < > 42.6 41.5  MCV 83.2  --  83.2 82.7  PLT 330  --  280 269  < > = values in this interval not displayed. Cardiac Enzymes:  Recent Labs  10/29/13 0918 10/29/13 1508 10/29/13 2109  TROPONINI 1.91* 7.07* 6.75*   BNP: No components found with this basename: POCBNP,  D-Dimer: No results found for this basename: DDIMER,  in the last 72 hours Hemoglobin A1C: No results found for this basename: HGBA1C,  in the last 72 hours Fasting Lipid Panel:  Recent Labs  10/30/13 0420  CHOL 152  HDL 43  LDLCALC 94  TRIG 76  CHOLHDL 3.5   Thyroid Function Tests: No results found for this basename: TSH, T4TOTAL, FREET3, T3FREE,  THYROIDAB,  in the last 72 hours Anemia Panel: No results found for this basename: VITAMINB12, FOLATE, FERRITIN, TIBC, IRON, RETICCTPCT,  in the last 72 hours  RADIOLOGY: Dg Chest Port 1 View  10/29/2013   CLINICAL DATA:  Chest pain.  EXAM: PORTABLE CHEST - 1 VIEW  COMPARISON:  Chest radiograph performed 10/19/2008  FINDINGS: The lungs are well-aerated and clear. There is no evidence of focal opacification, pleural effusion or pneumothorax.  The cardiomediastinal silhouette is within normal limits. No acute osseous abnormalities are seen.  IMPRESSION: No acute cardiopulmonary process seen.   Electronically Signed   By: Jeffery  [name] M.D.   On: 10/29/2013 06:43   ECHO: Study Conclusions  - Left ventricle: The cavity size was normal. There was mild concentric hypertrophy. Systolic function was mildly to moderately reduced. The estimated ejection fraction was in the range of 40% to 45%. There is hypokinesis of the distalanterolateral myocardium. There is severe hypokinesis to akinesis of the mid-distal anteroseptal myocardium. There is severe hypokinesis toakinesis of the distal inferior myocardium. Doppler parameters are consistent with abnormal left ventricular relaxation (grade 1 diastolic dysfunction). - Aortic valve: Mildly calcified annulus. Trileaflet; mildly calcified leaflets. No significant regurgitation. - Mitral valve: Mild regurgitation. - Left atrium: The atrium was mildly dilated. - Right atrium: Central venous pressure: 3mm Hg (est). - Tricuspid valve: Trivial regurgitation. - Pulmonary arteries: PA peak pressure: 27mm Hg (S). - Pericardium, extracardiac: There was no pericardial effusion. Impressions:  - No prior study for comparison. Mild LVH with LVEF 40-45%, wall motion abnormalities as noted above. Grade 1 diastolic dysfunction with mild left atrial enlargement. Mild mitral regurgitation. Trivial tricuspid regurgitation with PASP 27 mmHg. No pericardial  effusion.     ASSESSMENT AND PLAN: 1. Acute coronary syndrome: presumably due to severe LAD and diagonal vasospasm as his EF is reduced, 40-45%, with wall motion abnormalities as noted above. He tolerated low-dose carvedilol initiate lisinopril 5 mg daily as well as Imdur 30 mg daily which were initiated yesterday. Continue ASA, Plavix, and high-dose Lipitor. Given his persistent chest pain and unchanged EKG with ST elevation, will proceed with angiogram today.   2. HTN: BP well controlled overnight. He wasn't taking his antihypertensive prior to hospitalization. The carvedilol and lisinopril I'm initiating for LV dysfunction will concomitantly   treat HTN. 3. Tobacco abuse: cessation advised.    Signed,   Solianny D. Kennerly M.D, [facility], PGY-II  Patient seen and examined and history reviewed. Agree with above findings and plan. Patient experienced recurrent chest pain this am. Pain waxed and waned in intensity. Relieved with sl Ntg. Pain similar to pain that brought him to hospital. Exam is benign. Ecg shows persistent ST elevation in V2-3 with deep T wave inversion in the anterior leads. I personally reviewed his prior cath films and discussed with Dr. McAlhany. Patient has diffuse disease in the proximal LAD with a component of spasm. There is a tight lesion at the origin of the first diagonal. Given his recurrent chest pain I think we should repeat coronary angiography today. Consider evaluation of proximal LAD with IVUS vs. Flow wire. If LAD is OK consider PCI of the diagonal. IV Ntg resumed. The procedure and risks were reviewed including but not limited to death, myocardial infarction, stroke, arrythmias, bleeding, transfusion, emergency surgery, dye allergy, or renal dysfunction. The patient voices understanding and is agreeable to proceed.    Devera Englander JordanMD,FACC 10/31/2013 8:17 AM    

## 2013-10-31 NOTE — Progress Notes (Signed)
ANTICOAGULATION CONSULT NOTE - Initial Consult  Pharmacy Consult for Heparin Indication: chest pain/ACS  Allergies  Allergen Reactions  . Dairy Aid [Lactase]   . Eggs Or Egg-Derived Products   . Peanut-Containing Drug Products     Does not know which nuts, but states nuts make him vomit    Patient Measurements: Height: 6\' 2"  (188 cm) Weight: 165 lb 9.1 oz (75.1 kg) IBW/kg (Calculated) : 82.2  Vital Signs: Temp: 97.6 F (36.4 C) (11/10 0400) Temp src: Oral (11/10 0400) BP: 120/66 mmHg (11/10 1230) Pulse Rate: 49 (11/10 1230)  Labs:  Recent Labs  10/29/13 0600  10/29/13 0918 10/29/13 1508 10/29/13 2109 10/30/13 0420 10/31/13 0830  HGB 15.6  < > 14.5  --   --  13.7 14.2  HCT 46.1  < > 42.6  --   --  41.5 42.5  PLT 330  --  280  --   --  269 297  APTT 32  --   --   --   --   --   --   LABPROT 12.6  --  15.6*  --   --   --   --   INR 0.96  --  1.27  --   --   --   --   CREATININE 1.18  < > 1.00  --   --  0.88 0.91  TROPONINI  --   --  1.91* 7.07* 6.75*  --   --   < > = values in this interval not displayed.  Estimated Creatinine Clearance: 91.7 ml/min (by C-G formula based on Cr of 0.91).   Medical History: Past Medical History  Diagnosis Date  . Hypertension   . Tobacco abuse   . Anginal pain     Medications:  Prescriptions prior to admission  Medication Sig Dispense Refill  . lisinopril-hydrochlorothiazide (PRINZIDE,ZESTORETIC) 10-12.5 MG per tablet Take 1 tablet by mouth daily.        Assessment: 62 yo M admitted 10/29/2013 with CP. Pharmacy consulted to dose heparin 8h after sheath removal  Events: Sheath being removed ~ 2:31 PM ; S/p cath this am, plan for OHS later in the week based on platelet studeis  Goal of Therapy:  Heparin level 0.3-0.7 units/ml Monitor platelets by anticoagulation protocol: Yes   Plan:  At 2300 start heparin at 1100 units per hour Daily heparin level and CBC   Thank you for allowing pharmacy to be a part of this  patients care team.  Lovenia Kim Pharm.D., BCPS Clinical Pharmacist 10/31/2013 2:25 PM Pager: [phone] Phone: [phone]

## 2013-11-01 LAB — PLATELET INHIBITION P2Y12: Platelet Function  P2Y12: 48 [PRU] — ABNORMAL LOW (ref 194–418)

## 2013-11-01 LAB — HEPARIN LEVEL (UNFRACTIONATED): Heparin Unfractionated: 0.12 IU/mL — ABNORMAL LOW (ref 0.30–0.70)

## 2013-11-01 MED ORDER — ENOXAPARIN SODIUM 80 MG/0.8ML ~~LOC~~ SOLN
1.0000 mg/kg | Freq: Two times a day (BID) | SUBCUTANEOUS | Status: DC
Start: 2013-11-01 — End: 2013-11-03
  Administered 2013-11-01 – 2013-11-03 (×5): 75 mg via SUBCUTANEOUS
  Filled 2013-11-01 (×7): qty 0.8

## 2013-11-01 MED FILL — Sodium Chloride IV Soln 0.9%: INTRAVENOUS | Qty: 50 | Status: AC

## 2013-11-01 NOTE — Progress Notes (Signed)
ANTICOAGULATION CONSULT NOTE - Follow Up Consult  Pharmacy Consult for heparin Indication: CAD awaiting CABG  Labs:  Recent Labs  10/29/13 0918 10/29/13 1508 10/29/13 2109 10/30/13 0420 10/31/13 0830 11/01/13 0420  HGB 14.5  --   --  13.7 14.2  --   HCT 42.6  --   --  41.5 42.5  --   PLT 280  --   --  269 297  --   LABPROT 15.6*  --   --   --   --   --   INR 1.27  --   --   --   --   --   HEPARINUNFRC  --   --   --   --   --  0.12*  CREATININE 1.00  --   --  0.88 0.91  --   TROPONINI 1.91* 7.07* 6.75*  --   --   --     Assessment: 62yo male subtherapeutic on heparin with initial dosing post-cath now awaiting clearance of Plavix load for CABG.  Goal of Therapy:  Heparin level 0.3-0.7 units/ml   Plan:  Will increase heparin gtt by 3 units/kg/hr to 1300 units/hr and check level in 6hr.  Vernard Gambles, PharmD, BCPS  11/01/2013,7:07 AM

## 2013-11-01 NOTE — Progress Notes (Signed)
ANTICOAGULATION CONSULT NOTE - Initial Consult  Pharmacy Consult for Lovenox Indication: STEMI   Allergies  Allergen Reactions  . Dairy Aid [Lactase]   . Eggs Or Egg-Derived Products   . Peanut-Containing Drug Products     Does not know which nuts, but states nuts make him vomit    Patient Measurements: Height: 6\' 2"  (188 cm) Weight: 165 lb 9.1 oz (75.1 kg) IBW/kg (Calculated) : 82.2   Vital Signs: Temp: 98.3 F (36.8 C) (11/11 0443) Temp src: Oral (11/11 0443) BP: 143/119 mmHg (11/11 0600) Pulse Rate: 67 (11/11 0443)  Labs:  Recent Labs  10/29/13 0918 10/29/13 1508 10/29/13 2109 10/30/13 0420 10/31/13 0830 11/01/13 0420  HGB 14.5  --   --  13.7 14.2  --   HCT 42.6  --   --  41.5 42.5  --   PLT 280  --   --  269 297  --   LABPROT 15.6*  --   --   --   --   --   INR 1.27  --   --   --   --   --   HEPARINUNFRC  --   --   --   --   --  0.12*  CREATININE 1.00  --   --  0.88 0.91  --   TROPONINI 1.91* 7.07* 6.75*  --   --   --     Estimated Creatinine Clearance: 91.7 ml/min (by C-G formula based on Cr of 0.91).   Medical History: Past Medical History  Diagnosis Date  . Hypertension   . Tobacco abuse   . Anginal pain     Medications:  Scheduled:  . aspirin  81 mg Oral Daily  . atorvastatin  80 mg Oral q1800  . carvedilol  3.125 mg Oral BID WC  . enoxaparin (LOVENOX) injection  1 mg/kg Subcutaneous Q12H  . isosorbide mononitrate  30 mg Oral Daily  . lisinopril  5 mg Oral Daily    Assessment: 62 yo M admitted 10/29/2013 with CP/SOB. EKG in ED found anterior ST elevation. Pharmacy originally consulted to dose heparin, but patient does not want multiple blood draws associated with heparin checks, therefore transitioned to lovenox.  Pt has good SCr 0.91, Crcl ~91, and good UO.  Goal of Therapy:  Anti-Xa level 0.6-1.2 units/ml 4hrs after LMWH dose given Monitor platelets by anticoagulation protocol: Yes   Plan:  -Started Lovenox 75 mg SQ Q12H (1 mg/kg SQ  Q12H) -Discontinued Heparin gtt, d/c HL -Continue monitoring Scr, Crcl, CBC, s/s bleeds   Anabel Bene 11/01/2013,8:45 AM

## 2013-11-01 NOTE — Progress Notes (Signed)
SUBJECTIVE:  He underwent cath again yesterday with IVUS, and tolerated the procedure well. He complains of multiple lab sticks yesterday and requests to have labs drawn only once per day.  He denies chest pain, shortness of breath, acute back pain, abdominal pain, nausea, vomiting or diarrhea.    Intake/Output Summary (Last 24 hours) at 11/01/13 0725 Last data filed at 11/01/13 0600  Gross per 24 hour  Intake    807 ml  Output   1280 ml  Net   -473 ml    Current Facility-Administered Medications  Medication Dose Route Frequency Provider Last Rate Last Dose  . 0.9 %  sodium chloride infusion   Intravenous Continuous Vida Roller, MD   10 mL/hr at 10/29/13 1240  . acetaminophen (TYLENOL) tablet 650 mg  650 mg Oral Q4H PRN Kathleene Hazel, MD      . aspirin chewable tablet 81 mg  81 mg Oral Daily Kathleene Hazel, MD   81 mg at 10/31/13 1000  . atorvastatin (LIPITOR) tablet 80 mg  80 mg Oral q1800 Kathleene Hazel, MD   80 mg at 10/31/13 1800  . carvedilol (COREG) tablet 3.125 mg  3.125 mg Oral BID WC Laqueta Linden, MD   3.125 mg at 10/31/13 1700  . heparin ADULT infusion 100 units/mL (25000 units/250 mL)  1,300 Units/hr Intravenous Continuous Colleen Can, [facility] 11 mL/hr at 10/31/13 2251 1,100 Units/hr at 10/31/13 2251  . isosorbide mononitrate (IMDUR) 24 hr tablet 30 mg  30 mg Oral Daily Laqueta Linden, MD   30 mg at 10/31/13 1000  . lisinopril (PRINIVIL,ZESTRIL) tablet 5 mg  5 mg Oral Daily Laqueta Linden, MD   5 mg at 10/31/13 1000  . morphine 2 MG/ML injection 2 mg  2 mg Intravenous Q1H PRN Kathleene Hazel, MD      . nitroGLYCERIN (NITROSTAT) SL tablet 0.4 mg  0.4 mg Sublingual Q5 Min x 3 PRN Kathleene Hazel, MD   0.4 mg at 10/31/13 1610  . nitroGLYCERIN 0.2 mg/mL in dextrose 5 % infusion  2-200 mcg/min Intravenous Continuous Kathleene Hazel, MD 1.5 mL/hr at 10/31/13 2000 5 mcg/min at 10/31/13 2000  . ondansetron (ZOFRAN)  injection 4 mg  4 mg Intravenous Q6H PRN Kathleene Hazel, MD      . oxyCODONE-acetaminophen (PERCOCET/ROXICET) 5-325 MG per tablet 1-2 tablet  1-2 tablet Oral Q4H PRN Kathleene Hazel, MD        Filed Vitals:   11/01/13 0300 11/01/13 0443 11/01/13 0500 11/01/13 0600  BP: 114/60 126/92 143/72 143/119  Pulse:  67    Temp:  98.3 F (36.8 C)    TempSrc:  Oral    Resp:      Height:      Weight:      SpO2:  99%      PHYSICAL EXAM General: Pleasant, sitting in bed, in NAD.  Neck: No JVD.   Lungs: Clear to auscultation bilaterally, no respiratory distress. . CV: Nondisplaced PMI.  Regular rhythm, normal S1/S2, no S3/S4, no murmur.  No pretibial edema.  No carotid bruit.  Pedal pulses 2+ and equal bilaterally.   Abdomen: Soft, nontender, no hepatosplenomegaly, no distention.  Extremities: No clubbing or cyanosis. No edema. Right groin area at cath insertion site, with no hematoma covered with dressing that is c/d/i.  Neurologic: Alert and oriented x 3.  Psych: Normal affect.   Telemetry: Normal sinus rhythm   LABS: Basic Metabolic Panel:  Recent  Labs  10/30/13 0420 10/31/13 0830  NA 140 138  K 3.8 4.0  CL 105 103  CO2 22 23  GLUCOSE 94 101*  BUN 16 12  CREATININE 0.88 0.91  CALCIUM 9.1 9.4   Liver Function Tests:  Recent Labs  10/29/13 0918  AST 37  ALT 17  ALKPHOS 68  BILITOT 0.6  PROT 7.1  ALBUMIN 3.7   No results found for this basename: LIPASE, AMYLASE,  in the last 72 hours CBC:  Recent Labs  10/30/13 0420 10/31/13 0830  WBC 9.2 7.6  HGB 13.7 14.2  HCT 41.5 42.5  MCV 82.7 82.4  PLT 269 297   Cardiac Enzymes:  Recent Labs  10/29/13 0918 10/29/13 1508 10/29/13 2109  TROPONINI 1.91* 7.07* 6.75*   BNP: No components found with this basename: POCBNP,  D-Dimer: No results found for this basename: DDIMER,  in the last 72 hours Hemoglobin A1C: No results found for this basename: HGBA1C,  in the last 72 hours Fasting Lipid  Panel:  Recent Labs  10/30/13 0420  CHOL 152  HDL 43  LDLCALC 94  TRIG 76  CHOLHDL 3.5   Thyroid Function Tests: No results found for this basename: TSH, T4TOTAL, FREET3, T3FREE, THYROIDAB,  in the last 72 hours Anemia Panel: No results found for this basename: VITAMINB12, FOLATE, FERRITIN, TIBC, IRON, RETICCTPCT,  in the last 72 hours  RADIOLOGY: Dg Chest Port 1 View  10/29/2013   CLINICAL DATA:  Chest pain.  EXAM: PORTABLE CHEST - 1 VIEW  COMPARISON:  Chest radiograph performed 10/19/2008  FINDINGS: The lungs are well-aerated and clear. There is no evidence of focal opacification, pleural effusion or pneumothorax.  The cardiomediastinal silhouette is within normal limits. No acute osseous abnormalities are seen.  IMPRESSION: No acute cardiopulmonary process seen.   Electronically Signed   By: Roanna Raider M.D.   On: 10/29/2013 06:43   ECHO: Study Conclusions  - Left ventricle: The cavity size was normal. There was mild concentric hypertrophy. Systolic function was mildly to moderately reduced. The estimated ejection fraction was in the range of 40% to 45%. There is hypokinesis of the distalanterolateral myocardium. There is severe hypokinesis to akinesis of the mid-distal anteroseptal myocardium. There is severe hypokinesis toakinesis of the distal inferior myocardium. Doppler parameters are consistent with abnormal left ventricular relaxation (grade 1 diastolic dysfunction). - Aortic valve: Mildly calcified annulus. Trileaflet; mildly calcified leaflets. No significant regurgitation. - Mitral valve: Mild regurgitation. - Left atrium: The atrium was mildly dilated. - Right atrium: Central venous pressure: 3mm Hg (est). - Tricuspid valve: Trivial regurgitation. - Pulmonary arteries: PA peak pressure: 27mm Hg (S). - Pericardium, extracardiac: There was no pericardial effusion. Impressions:  - No prior study for comparison. Mild LVH with LVEF 40-45%, wall motion  abnormalities as noted above. Grade 1 diastolic dysfunction with mild left atrial enlargement. Mild mitral regurgitation. Trivial tricuspid regurgitation with PASP 27 mmHg. No pericardial effusion.     ASSESSMENT AND PLAN: 1. Acute coronary syndrome: likely due to extensive CAD with severe stenosis of the Diagonal ostium (80-90% stenosis) and LAD proximal and mid disease with 40% stenosis and RCA with proximal 60-70% stenosis and 40% distal stenosis. His EF is reduced, 40-45%, with wall motion abnormalities as noted above. Given the extent of his CAD, CABG is indicated and he has already met with Dr. Dorris Fetch in Cardiothoracic Surgery and has agreed to this procedure. Plavix has been stopped and he is now on heparin drip (to be changed to lovenox)  as well as nitro drip.  -Continue  ASA, lisinopril, coreg, atorvastatin.  -Will check BMET for renal function post cath iodine contrast.  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 (for now will use low dose xanax prn.)  2. HTN: BP slightly up occasionally but mostly under 130/90. He wasn't taking his antihypertensive prior to hospitalization. Will continue the carvedilol and lisinopril for LV dysfunction and HTN.  3. Tobacco abuse: cessation advised.   Signed,    Ky Barban, MD, [facility], PGY-II 11/01/2013 7:25 AM Patient seen and examined. I agree with the assessment and plan as detailed above. See also my additional thoughts below.   I spoke with Dr. Clifton James about the patient. He had drawn a picture for the patient and spoke with the patient and his wife at length yesterday. Plavix has been stopped. He will be kept stable during washout period and then bypass surgery will be done. The patient is very hesitant about having multiple sticks for blood drawing. This is limiting our ability to use IV heparin. I've  chosen to switch him to Lovenox. I explained the rationale to him and he is willing to proceed with this. He is not completely refusing Lab drawing. We will do our best to be sure that all labs are drawn at the same time when we can.  Willa Rough, MD, [facility] 11/01/2013 8:40 AM

## 2013-11-01 NOTE — Progress Notes (Addendum)
CARDIAC REHAB PHASE I   PRE:  Rate/Rhythm: 67 SR  BP:  Supine:   Sitting: 141/74  Standing:    SaO2:   MODE:  Ambulation: 350 ft   POST:  Rate/Rhythm: 70 SR  BP:  Supine:   Sitting: 137/77  Standing:    SaO2:  1135-1235 Pt tolerated ambulation well without c/o of cp or SOB. VS stable. Pt to recliner after walk with call light in reach. Started OHS education with pt. I gave pre-op surgery booklet to pt. I encouraged him to watch OHS video. Instructed pt that he would need 24/7 care at home when discharged from surgery. He cares for his wife she has a lot of health issues.Pt states that he will have to work on a plan. We will continue to follow pt.  Melina Copa RN 11/01/2013 12:43 PM

## 2013-11-01 NOTE — Progress Notes (Signed)
1 Day Post-Op Procedure(s) (LRB): LEFT HEART CATHETERIZATION WITH CORONARY ANGIOGRAM (N/A) Subjective: No complaints today  Objective: Vital signs in last 24 hours: Temp:  [98 F (36.7 C)-98.3 F (36.8 C)] 98.3 F (36.8 C) (11/11 0443) Pulse Rate:  [55-67] 67 (11/11 0443) Cardiac Rhythm:  [-] Sinus bradycardia;Normal sinus rhythm;Other (Comment) (11/11 0800) Resp:  [12-24] 24 (11/11 0000) BP: (101-160)/(53-119) 131/76 mmHg (11/11 1200) SpO2:  [96 %-100 %] 99 % (11/11 0443)  Hemodynamic parameters for last 24 hours:    Intake/Output from previous day: 11/10 0701 - 11/11 0700 In: 818.5 [I.V.:818.5] Out: 1280 [Urine:1280] Intake/Output this shift: Total I/O In: 692 [P.O.:600; I.V.:92] Out: 500 [Urine:500]  General appearance: alert and no distress  Lab Results:  Recent Labs  10/30/13 0420 10/31/13 0830  WBC 9.2 7.6  HGB 13.7 14.2  HCT 41.5 42.5  PLT 269 297   BMET:  Recent Labs  10/30/13 0420 10/31/13 0830  NA 140 138  K 3.8 4.0  CL 105 103  CO2 22 23  GLUCOSE 94 101*  BUN 16 12  CREATININE 0.88 0.91  CALCIUM 9.1 9.4    PT/INR: No results found for this basename: LABPROT, INR,  in the last 72 hours ABG    Component Value Date/Time   TCO2 26 10/29/2013 0606   CBG (last 3)  No results found for this basename: GLUCAP,  in the last 72 hours  Assessment/Plan: S/P Procedure(s) (LRB): LEFT HEART CATHETERIZATION WITH CORONARY ANGIOGRAM (N/A) -P2Y12 shows significant inhibition of platelet function  I discussed timing of surgery options with Mr. and Mrs. Morelos. To be safest in terms of bleeding risk would necessitate waiting until Monday to do CABG. However, I'm uncomfortable waiting that long given his presentation and CP while in the hospital. I think proceeding on Friday would probably be best in terms of his overall risk- bleeding v recurrent ischemia. They are in agreeement.   LOS: 3 days    Odette Watanabe C 11/01/2013

## 2013-11-02 ENCOUNTER — Inpatient Hospital Stay (HOSPITAL_COMMUNITY): Payer: 59

## 2013-11-02 LAB — BASIC METABOLIC PANEL
BUN: 16 mg/dL (ref 6–23)
CO2: 23 mEq/L (ref 19–32)
Calcium: 9.2 mg/dL (ref 8.4–10.5)
Chloride: 104 mEq/L (ref 96–112)
Creatinine, Ser: 0.98 mg/dL (ref 0.50–1.35)
GFR calc Af Amer: >90 mL/min (ref >90)
GFR calc non Af Amer: 88 mL/min — ABNORMAL LOW (ref >90)
Glucose, Bld: 96 mg/dL (ref 70–99)
Potassium: 4.1 mEq/L (ref 3.5–5.1)
Sodium: 138 mEq/L (ref 135–145)

## 2013-11-02 LAB — PULMONARY FUNCTION TEST
FEF 25-75 Post: 3.03 L/sec
FEF 25-75 Pre: 2.42 L/sec
FEF2575-%Change-Post: 25 %
FEF2575-%Pred-Post: 95 %
FEF2575-%Pred-Pre: 76 %
FEV1-%Change-Post: 4 %
FEV1-%Pred-Post: 111 %
FEV1-%Pred-Pre: 106 %
FEV1-Post: 3.88 L
FEV1-Pre: 3.71 L
FEV1FVC-%Change-Post: 1 %
FEV1FVC-%Pred-Pre: 91 %
FEV6-%Change-Post: 2 %
FEV6-%Pred-Post: 120 %
FEV6-%Pred-Pre: 117 %
FEV6-Post: 5.21 L
FEV6-Pre: 5.09 L
FEV6FVC-%Change-Post: 0 %
FEV6FVC-%Pred-Post: 100 %
FEV6FVC-%Pred-Pre: 100 %
FVC-%Change-Post: 2 %
FVC-%Pred-Post: 120 %
FVC-%Pred-Pre: 116 %
FVC-Post: 5.37 L
FVC-Pre: 5.23 L
Post FEV1/FVC ratio: 72 %
Post FEV6/FVC ratio: 97 %
Pre FEV1/FVC ratio: 71 %
Pre FEV6/FVC Ratio: 97 %

## 2013-11-02 LAB — CBC
HCT: 40.1 % (ref 39.0–52.0)
Hemoglobin: 13.5 g/dL (ref 13.0–17.0)
MCH: 27.7 pg (ref 26.0–34.0)
MCHC: 33.7 g/dL (ref 30.0–36.0)
MCV: 82.3 fL (ref 78.0–100.0)
Platelets: 264 10*3/uL (ref 150–400)
RBC: 4.87 MIL/uL (ref 4.22–5.81)
RDW: 13.3 % (ref 11.5–15.5)
WBC: 9.4 10*3/uL (ref 4.0–10.5)

## 2013-11-02 MED ORDER — ALBUTEROL SULFATE (5 MG/ML) 0.5% IN NEBU
2.5000 mg | INHALATION_SOLUTION | Freq: Once | RESPIRATORY_TRACT | Status: AC
  Administered 2013-11-02: 2.5 mg via RESPIRATORY_TRACT

## 2013-11-02 NOTE — Progress Notes (Signed)
Patient examined chart reviewed. Hemodynamics stable RFA cath sight ok LM disease and tight D1  P2Y very  surpressed at less than 50  CABG scheduled for Friday  Patient understands plan.  No murmur on exam and clear lungs  Charlton Haws

## 2013-11-02 NOTE — Progress Notes (Signed)
SUBJECTIVE:  He has had a spasm sensation in his chest off and on. No diaphoresis, dyspnea, abdominal pain, or acute back pain overnight. He is tolerating the Lovenox [place] injections well.   Intake/Output Summary (Last 24 hours) at 11/02/13 0717 Last data filed at 11/02/13 0700  Gross per 24 hour  Intake   1356 ml  Output    500 ml  Net    856 ml    Current Facility-Administered Medications  Medication Dose Route Frequency Provider Last Rate Last Dose  . 0.9 %  sodium chloride infusion   Intravenous Continuous Vida Roller, MD 10 mL/hr at 11/01/13 2000    . acetaminophen (TYLENOL) tablet 650 mg  650 mg Oral Q4H PRN Kathleene Hazel, MD      . aspirin chewable tablet 81 mg  81 mg Oral Daily Kathleene Hazel, MD   81 mg at 11/01/13 1007  . atorvastatin (LIPITOR) tablet 80 mg  80 mg Oral q1800 Kathleene Hazel, MD   80 mg at 11/01/13 1704  . carvedilol (COREG) tablet 3.125 mg  3.125 mg Oral BID WC Laqueta Linden, MD   3.125 mg at 11/01/13 1704  . enoxaparin (LOVENOX) injection 75 mg  1 mg/kg Subcutaneous Q12H Ky Barban, MD   75 mg at 11/01/13 2151  . isosorbide mononitrate (IMDUR) 24 hr tablet 30 mg  30 mg Oral Daily Laqueta Linden, MD   30 mg at 11/01/13 1007  . lisinopril (PRINIVIL,ZESTRIL) tablet 5 mg  5 mg Oral Daily Laqueta Linden, MD   5 mg at 11/01/13 1007  . morphine 2 MG/ML injection 2 mg  2 mg Intravenous Q1H PRN Kathleene Hazel, MD      . nitroGLYCERIN (NITROSTAT) SL tablet 0.4 mg  0.4 mg Sublingual Q5 Min x 3 PRN Kathleene Hazel, MD   0.4 mg at 10/31/13 1610  . nitroGLYCERIN 0.2 mg/mL in dextrose 5 % infusion  2-200 mcg/min Intravenous Continuous Kathleene Hazel, MD 1.5 mL/hr at 11/01/13 2000 5 mcg/min at 11/01/13 2000  . ondansetron (ZOFRAN) injection 4 mg  4 mg Intravenous Q6H PRN Kathleene Hazel, MD      . oxyCODONE-acetaminophen (PERCOCET/ROXICET) 5-325 MG per tablet 1-2 tablet  1-2 tablet Oral Q4H PRN  Kathleene Hazel, MD   2 tablet at 10/31/13 0946    Filed Vitals:   11/01/13 2000 11/02/13 0000 11/02/13 0400 11/02/13 0458  BP: 103/64 120/50 138/74   Pulse:   58   Temp: 99 F (37.2 C) 98.3 F (36.8 C) 98.5 F (36.9 C)   TempSrc: Oral Oral Oral   Resp: 14 16 16    Height:      Weight:    167 lb 1.7 oz (75.8 kg)  SpO2:  96% 99%     PHYSICAL EXAM General: Pleasant, sitting in bed, in NAD.  Neck: No JVD.   Lungs: Clear to auscultation bilaterally, no respiratory distress. . CV: Nondisplaced PMI.  Regular rhythm, normal S1/S2, no S3/S4, no murmur.  No pretibial edema.  No carotid bruit.  Pedal pulses 2+ and equal bilaterally.   Abdomen: Soft, nontender, no hepatosplenomegaly, no distention.  Extremities: No clubbing or cyanosis. No edema. Right groin area at cath insertion site, with no hematoma covered with dressing that is c/d/i.  Neurologic: Alert and oriented x 3.  Psych: Normal affect.   Telemetry: Normal sinus rhythm   LABS: Basic Metabolic Panel:  Recent Labs  96/04/54 0830  NA 138  K 4.0  CL 103  CO2 23  GLUCOSE 101*  BUN 12  CREATININE 0.91  CALCIUM 9.4   Liver Function Tests: No results found for this basename: AST, ALT, ALKPHOS, BILITOT, PROT, ALBUMIN,  in the last 72 hours No results found for this basename: LIPASE, AMYLASE,  in the last 72 hours CBC:  Recent Labs  10/31/13 0830 11/02/13 0606  WBC 7.6 9.4  HGB 14.2 13.5  HCT 42.5 40.1  MCV 82.4 82.3  PLT 297 264   Cardiac Enzymes: No results found for this basename: CKTOTAL, CKMB, CKMBINDEX, TROPONINI,  in the last 72 hours BNP: No components found with this basename: POCBNP,  D-Dimer: No results found for this basename: DDIMER,  in the last 72 hours Hemoglobin A1C: No results found for this basename: HGBA1C,  in the last 72 hours Fasting Lipid Panel: No results found for this basename: CHOL, HDL, LDLCALC, TRIG, CHOLHDL, LDLDIRECT,  in the last 72 hours Thyroid Function Tests: No  results found for this basename: TSH, T4TOTAL, FREET3, T3FREE, THYROIDAB,  in the last 72 hours Anemia Panel: No results found for this basename: VITAMINB12, FOLATE, FERRITIN, TIBC, IRON, RETICCTPCT,  in the last 72 hours  RADIOLOGY: Dg Chest Port 1 View  10/29/2013   CLINICAL DATA:  Chest pain.  EXAM: PORTABLE CHEST - 1 VIEW  COMPARISON:  Chest radiograph performed 10/19/2008  FINDINGS: The lungs are well-aerated and clear. There is no evidence of focal opacification, pleural effusion or pneumothorax.  The cardiomediastinal silhouette is within normal limits. No acute osseous abnormalities are seen.  IMPRESSION: No acute cardiopulmonary process seen.   Electronically Signed   By: Roanna Raider M.D.   On: 10/29/2013 06:43   ECHO: Study Conclusions  - Left ventricle: The cavity size was normal. There was mild concentric hypertrophy. Systolic function was mildly to moderately reduced. The estimated ejection fraction was in the range of 40% to 45%. There is hypokinesis of the distalanterolateral myocardium. There is severe hypokinesis to akinesis of the mid-distal anteroseptal myocardium. There is severe hypokinesis toakinesis of the distal inferior myocardium. Doppler parameters are consistent with abnormal left ventricular relaxation (grade 1 diastolic dysfunction). - Aortic valve: Mildly calcified annulus. Trileaflet; mildly calcified leaflets. No significant regurgitation. - Mitral valve: Mild regurgitation. - Left atrium: The atrium was mildly dilated. - Right atrium: Central venous pressure: 3mm Hg (est). - Tricuspid valve: Trivial regurgitation. - Pulmonary arteries: PA peak pressure: 27mm Hg (S). - Pericardium, extracardiac: There was no pericardial effusion. Impressions:  - No prior study for comparison. Mild LVH with LVEF 40-45%, wall motion abnormalities as noted above. Grade 1 diastolic dysfunction with mild left atrial enlargement. Mild mitral regurgitation. Trivial  tricuspid regurgitation with PASP 27 mmHg. No pericardial effusion.     ASSESSMENT AND PLAN: 1. Acute coronary syndrome: He has extensive CAD with severe stenosis of the Diagonal ostium (80-90% stenosis) and LAD proximal and mid disease with 40% stenosis and RCA with proximal 60-70% stenosis and 40% distal stenosis. His EF is reduced, 40-45%, with wall motion abnormalities as noted above. Given the extent of his CAD, CABG is indicated. He met again with Dr. Dorris Fetch in Cardiothoracic Surgery yesterday and has agreed to undergo this surgery on Friday.  -Continue  ASA, lisinopril, coreg, atorvastatin.  -Heparin drip stopped as pt requested to not have labs.  -Continue Lovenox for anticoagulation. -Would hold off on SSRI or SNRI pre-op for CABG.   2. HTN: BP well controlled overnight below 140/90. Will continue the carvedilol  and lisinopril for LV dysfunction and HTN.  3. Tobacco abuse: He has a 12 pack-year smoking history but has quit before. He was smoking 6-10 cigarettes per day prior to this admission and reports no withdrawal symptoms at this time. He is determined to quit "cold [place]". Provided brief smoking cessation.   Signed,  Ky Barban, MD, [facility], PGY-II 11/02/2013 7:17 AM

## 2013-11-02 NOTE — Progress Notes (Signed)
CARDIAC REHAB PHASE I   PRE:  Rate/Rhythm: 70 SR    BP: sitting 132/79    SaO2:   MODE:  Ambulation: 2100 ft   POST:  Rate/Rhythm: 78 SR    BP: sitting 139/78     SaO2:    Tolerated very well. Denied CP. Wanted to go further. Sts he walked 10 laps around unit last night. No c/o. Reviewed pre-op ed and pt practiced getting OOB without arms. Plans to watch more videos today. [id number]  Elissa Lovett [place] CES, ACSM 11/02/2013 11:10 AM

## 2013-11-03 DIAGNOSIS — Z01810 Encounter for preprocedural cardiovascular examination: Secondary | ICD-10-CM

## 2013-11-03 DIAGNOSIS — I2510 Atherosclerotic heart disease of native coronary artery without angina pectoris: Secondary | ICD-10-CM

## 2013-11-03 LAB — SURGICAL PCR SCREEN
MRSA, PCR: NEGATIVE
Staphylococcus aureus: NEGATIVE

## 2013-11-03 LAB — CBC
HCT: 38.2 % — ABNORMAL LOW (ref 39.0–52.0)
Hemoglobin: 13 g/dL (ref 13.0–17.0)
MCH: 27.8 pg (ref 26.0–34.0)
MCHC: 34 g/dL (ref 30.0–36.0)
MCV: 81.6 fL (ref 78.0–100.0)
Platelets: 259 10*3/uL (ref 150–400)
RBC: 4.68 MIL/uL (ref 4.22–5.81)
RDW: 13.3 % (ref 11.5–15.5)
WBC: 8.1 10*3/uL (ref 4.0–10.5)

## 2013-11-03 MED ORDER — BISACODYL 5 MG PO TBEC: 5.0000 mg | DELAYED_RELEASE_TABLET | Freq: Once | ORAL | Status: DC

## 2013-11-03 MED ORDER — MAGNESIUM SULFATE 50 % IJ SOLN
40.0000 meq | INTRAMUSCULAR | Status: DC
  Filled 2013-11-03: qty 10

## 2013-11-03 MED ORDER — ALPRAZOLAM 0.25 MG PO TABS
0.2500 mg | ORAL_TABLET | ORAL | Status: DC | PRN
  Administered 2013-11-04: 0.25 mg via ORAL
  Filled 2013-11-03: qty 1

## 2013-11-03 MED ORDER — NITROGLYCERIN IN D5W 200-5 MCG/ML-% IV SOLN
2.0000 ug/min | INTRAVENOUS | Status: AC
  Administered 2013-11-04: 5 ug/min via INTRAVENOUS
  Filled 2013-11-03: qty 250

## 2013-11-03 MED ORDER — DOPAMINE-DEXTROSE 3.2-5 MG/ML-% IV SOLN
2.0000 ug/kg/min | INTRAVENOUS | Status: DC
  Filled 2013-11-03: qty 250

## 2013-11-03 MED ORDER — DEXMEDETOMIDINE HCL IN NACL 400 MCG/100ML IV SOLN
0.1000 ug/kg/h | INTRAVENOUS | Status: AC
  Administered 2013-11-04: .3 ug/kg/h via INTRAVENOUS
  Filled 2013-11-03: qty 100

## 2013-11-03 MED ORDER — SODIUM CHLORIDE 0.9 % IV SOLN
INTRAVENOUS | Status: AC
  Administered 2013-11-04: 1 [IU]/h via INTRAVENOUS
  Filled 2013-11-03: qty 1

## 2013-11-03 MED ORDER — DEXTROSE 5 % IV SOLN
750.0000 mg | INTRAVENOUS | Status: DC
  Filled 2013-11-03 (×2): qty 750

## 2013-11-03 MED ORDER — PHENYLEPHRINE HCL 10 MG/ML IJ SOLN
30.0000 ug/min | INTRAVENOUS | Status: DC
  Administered 2013-11-04: 10 ug/min via INTRAVENOUS
  Filled 2013-11-03: qty 2

## 2013-11-03 MED ORDER — TEMAZEPAM 15 MG PO CAPS: 15.0000 mg | ORAL_CAPSULE | Freq: Once | ORAL | Status: AC | PRN

## 2013-11-03 MED ORDER — SODIUM CHLORIDE 0.9 % IV SOLN
INTRAVENOUS | Status: AC
  Administered 2013-11-04: 70 mL/h via INTRAVENOUS
  Filled 2013-11-03: qty 40

## 2013-11-03 MED ORDER — EPINEPHRINE HCL 1 MG/ML IJ SOLN
0.5000 ug/min | INTRAVENOUS | Status: DC
  Filled 2013-11-03: qty 4

## 2013-11-03 MED ORDER — VANCOMYCIN HCL 10 G IV SOLR
1250.0000 mg | INTRAVENOUS | Status: AC
  Administered 2013-11-04: 1250 mg via INTRAVENOUS
  Filled 2013-11-03: qty 1250

## 2013-11-03 MED ORDER — CEFUROXIME SODIUM 1.5 G IJ SOLR
1.5000 g | INTRAMUSCULAR | Status: AC
  Administered 2013-11-04: 1.5 g via INTRAVENOUS
  Administered 2013-11-04: .75 g via INTRAVENOUS
  Filled 2013-11-03: qty 1.5

## 2013-11-03 MED ORDER — PLASMA-LYTE 148 IV SOLN
INTRAVENOUS | Status: AC
  Administered 2013-11-04: 15:00:00
  Filled 2013-11-03: qty 2.5

## 2013-11-03 MED ORDER — POTASSIUM CHLORIDE 2 MEQ/ML IV SOLN
80.0000 meq | INTRAVENOUS | Status: DC
  Filled 2013-11-03: qty 40

## 2013-11-03 MED ORDER — SODIUM CHLORIDE 0.9 % IV SOLN
INTRAVENOUS | Status: DC
  Filled 2013-11-03: qty 30

## 2013-11-03 MED ORDER — DIAZEPAM 5 MG PO TABS
5.0000 mg | ORAL_TABLET | Freq: Once | ORAL | Status: AC
  Administered 2013-11-04: 5 mg via ORAL
  Filled 2013-11-03: qty 1

## 2013-11-03 NOTE — Progress Notes (Signed)
CARDIAC REHAB PHASE I   PRE:  Rate/Rhythm: 63 SR  BP:  Supine:   Sitting: 129/69  Standing:    SaO2: 100 RA  MODE:  Ambulation: 4050 ft   POST:  Rate/Rhythm: 75 SR  BP:  Supine:   Sitting: 134/79  Standing:    SaO2: 100 RA 1440-1520 Pt tolerated ambulation well without c/o of cp or SOB. VS stable Pt did not want to stop. I had him to stop after 4050 feet.   Melina Copa RN 11/03/2013 3:26 PM

## 2013-11-03 NOTE — Progress Notes (Signed)
MD ordered OK to leave expired IV in and use until surgery 11/04/13; will continue to monitor IV site appropriately;

## 2013-11-03 NOTE — Progress Notes (Signed)
3 Days Post-Op Procedure(s) (LRB): LEFT HEART CATHETERIZATION WITH CORONARY ANGIOGRAM (N/A) Subjective: No complaints, denies CP or SOB  Objective: Vital signs in last 24 hours: Temp:  [97.9 F (36.6 C)-98.7 F (37.1 C)] 98 F (36.7 C) (11/13 1227) Pulse Rate:  [56] 56 (11/13 1227) Cardiac Rhythm:  [-] Sinus bradycardia (11/13 1227) Resp:  [14-20] 20 (11/13 1227) BP: (102-138)/(37-85) 102/75 mmHg (11/13 1227) SpO2:  [97 %-100 %] 99 % (11/13 1227) Weight:  [164 lb 8 oz (74.617 kg)] 164 lb 8 oz (74.617 kg) (11/13 0500)  Hemodynamic parameters for last 24 hours:    Intake/Output from previous day: 11/12 0701 - 11/13 0700 In: 1596 [P.O.:1320; I.V.:276] Out: 1775 [Urine:1775] Intake/Output this shift: Total I/O In: 57.5 [I.V.:57.5] Out: 150 [Urine:150]  General appearance: alert and no distress Neurologic: intact Heart: regular rate and rhythm Lungs: clear to auscultation bilaterally  Lab Results:  Recent Labs  11/02/13 0606 11/03/13 0414  WBC 9.4 8.1  HGB 13.5 13.0  HCT 40.1 38.2*  PLT 264 259   BMET:  Recent Labs  11/02/13 0606  NA 138  K 4.1  CL 104  CO2 23  GLUCOSE 96  BUN 16  CREATININE 0.98  CALCIUM 9.2    PT/INR: No results found for this basename: LABPROT, INR,  in the last 72 hours ABG    Component Value Date/Time   TCO2 26 10/29/2013 0606   CBG (last 3)  No results found for this basename: GLUCAP,  in the last 72 hours  Assessment/Plan: S/P Procedure(s) (LRB): LEFT HEART CATHETERIZATION WITH CORONARY ANGIOGRAM (N/A) For CABG tomorrow 2nd case All questions answered Tomorrow will be 4 1/2 days of plavix- still some bleeding risk Has been on lovenox- to be stopped prior to surgery   LOS: 5 days    HENDRICKSON,STEVEN C 11/03/2013

## 2013-11-03 NOTE — Progress Notes (Addendum)
VASCULAR LAB PRELIMINARY  PRELIMINARY  PRELIMINARY  PRELIMINARY  Pre-op Cardiac Surgery  Carotid Findings:  Bilateral:  1-39% ICA stenosis.  Vertebral artery flow is antegrade.        Upper Extremity Right Left  Brachial Pressures 114 triphasic 113 triphasic  Radial Waveforms triphasic triphasic  Ulnar Waveforms triphasic triphasic  Palmar Arch (Allen's Test) WNL WNL   Findings:  Doppler waveforms remain normal with ulnar and radial compressions bilaterally.    Lower  Extremity Right Left  Dorsalis Pedis    Anterior Tibial    Posterior Tibial    Ankle/Brachial Indices      Findings:  Palpable pedal pulses x 4.   Jamaira Sherk, RVT 11/03/2013, 6:25 PM

## 2013-11-03 NOTE — Progress Notes (Signed)
SUBJECTIVE:  He is actively engaged with cardiac rehab and reports walking 10 labs around the unit yesterday with chest pain.He continues to have off and on "spasm" sensation inside his chest. He had no chest pain, diaphoresis, or dyspnea overnight or this morning.     Intake/Output Summary (Last 24 hours) at 11/03/13 0754 Last data filed at 11/03/13 0700  Gross per 24 hour  Intake   1596 ml  Output   1775 ml  Net   -179 ml    Current Facility-Administered Medications  Medication Dose Route Frequency Provider Last Rate Last Dose  . 0.9 %  sodium chloride infusion   Intravenous Continuous Vida Roller, MD 10 mL/hr at 11/02/13 1900    . acetaminophen (TYLENOL) tablet 650 mg  650 mg Oral Q4H PRN Kathleene Hazel, MD      . aspirin chewable tablet 81 mg  81 mg Oral Daily Kathleene Hazel, MD   81 mg at 11/02/13 0955  . atorvastatin (LIPITOR) tablet 80 mg  80 mg Oral q1800 Kathleene Hazel, MD   80 mg at 11/02/13 1626  . carvedilol (COREG) tablet 3.125 mg  3.125 mg Oral BID WC Laqueta Linden, MD   3.125 mg at 11/02/13 1626  . enoxaparin (LOVENOX) injection 75 mg  1 mg/kg Subcutaneous Q12H Ky Barban, MD   75 mg at 11/02/13 2139  . isosorbide mononitrate (IMDUR) 24 hr tablet 30 mg  30 mg Oral Daily Laqueta Linden, MD   30 mg at 11/02/13 0955  . lisinopril (PRINIVIL,ZESTRIL) tablet 5 mg  5 mg Oral Daily Laqueta Linden, MD   5 mg at 11/02/13 0955  . morphine 2 MG/ML injection 2 mg  2 mg Intravenous Q1H PRN Kathleene Hazel, MD      . nitroGLYCERIN (NITROSTAT) SL tablet 0.4 mg  0.4 mg Sublingual Q5 Min x 3 PRN Kathleene Hazel, MD   0.4 mg at 10/31/13 0714  . nitroGLYCERIN 0.2 mg/mL in dextrose 5 % infusion  2-200 mcg/min Intravenous Continuous Kathleene Hazel, MD 1.5 mL/hr at 11/02/13 1900 5 mcg/min at 11/02/13 1900  . ondansetron (ZOFRAN) injection 4 mg  4 mg Intravenous Q6H PRN Kathleene Hazel, MD      . oxyCODONE-acetaminophen  (PERCOCET/ROXICET) 5-325 MG per tablet 1-2 tablet  1-2 tablet Oral Q4H PRN Kathleene Hazel, MD   2 tablet at 11/01/13 1006    Filed Vitals:   11/02/13 2255 11/03/13 0211 11/03/13 0419 11/03/13 0500  BP: 119/74 133/61 138/75   Pulse:      Temp: 98.7 F (37.1 C)  98 F (36.7 C)   TempSrc: Oral  Oral   Resp: 14 14 16    Height:      Weight:    164 lb 8 oz (74.617 kg)  SpO2: 97%  98%     PHYSICAL EXAM General: Pleasant, sitting in bed, in NAD.  Neck: No JVD.   Lungs: Clear to auscultation bilaterally, no respiratory distress. . CV: Nondisplaced PMI.  Regular rhythm, normal S1/S2, no S3/S4, no murmur.  No pretibial edema.  No carotid bruit.  Pedal pulses 2+ and equal bilaterally.   Abdomen: Soft, nontender, no hepatosplenomegaly, no distention.  Extremities: No clubbing or cyanosis. No edema. Right groin area at cath insertion site, with no hematoma covered with dressing that is c/d/i.  Neurologic: Alert and oriented x 3.  Psych: Normal affect.   Telemetry: Normal sinus rhythm   LABS: Basic Metabolic Panel:  Recent Labs  10/31/13 0830 11/02/13 0606  NA 138 138  K 4.0 4.1  CL 103 104  CO2 23 23  GLUCOSE 101* 96  BUN 12 16  CREATININE 0.91 0.98  CALCIUM 9.4 9.2   Liver Function Tests: No results found for this basename: AST, ALT, ALKPHOS, BILITOT, PROT, ALBUMIN,  in the last 72 hours No results found for this basename: LIPASE, AMYLASE,  in the last 72 hours CBC:  Recent Labs  11/02/13 0606 11/03/13 0414  WBC 9.4 8.1  HGB 13.5 13.0  HCT 40.1 38.2*  MCV 82.3 81.6  PLT 264 259   Cardiac Enzymes: No results found for this basename: CKTOTAL, CKMB, CKMBINDEX, TROPONINI,  in the last 72 hours BNP: No components found with this basename: POCBNP,  D-Dimer: No results found for this basename: DDIMER,  in the last 72 hours Hemoglobin A1C: No results found for this basename: HGBA1C,  in the last 72 hours Fasting Lipid Panel: No results found for this basename:  CHOL, HDL, LDLCALC, TRIG, CHOLHDL, LDLDIRECT,  in the last 72 hours Thyroid Function Tests: No results found for this basename: TSH, T4TOTAL, FREET3, T3FREE, THYROIDAB,  in the last 72 hours Anemia Panel: No results found for this basename: VITAMINB12, FOLATE, FERRITIN, TIBC, IRON, RETICCTPCT,  in the last 72 hours  RADIOLOGY: Dg Chest Port 1 View  10/29/2013   CLINICAL DATA:  Chest pain.  EXAM: PORTABLE CHEST - 1 VIEW  COMPARISON:  Chest radiograph performed 10/19/2008  FINDINGS: The lungs are well-aerated and clear. There is no evidence of focal opacification, pleural effusion or pneumothorax.  The cardiomediastinal silhouette is within normal limits. No acute osseous abnormalities are seen.  IMPRESSION: No acute cardiopulmonary process seen.   Electronically Signed   By: Roanna Raider M.D.   On: 10/29/2013 06:43   ECHO: Study Conclusions  - Left ventricle: The cavity size was normal. There was mild concentric hypertrophy. Systolic function was mildly to moderately reduced. The estimated ejection fraction was in the range of 40% to 45%. There is hypokinesis of the distalanterolateral myocardium. There is severe hypokinesis to akinesis of the mid-distal anteroseptal myocardium. There is severe hypokinesis toakinesis of the distal inferior myocardium. Doppler parameters are consistent with abnormal left ventricular relaxation (grade 1 diastolic dysfunction). - Aortic valve: Mildly calcified annulus. Trileaflet; mildly calcified leaflets. No significant regurgitation. - Mitral valve: Mild regurgitation. - Left atrium: The atrium was mildly dilated. - Right atrium: Central venous pressure: 3mm Hg (est). - Tricuspid valve: Trivial regurgitation. - Pulmonary arteries: PA peak pressure: 27mm Hg (S). - Pericardium, extracardiac: There was no pericardial effusion. Impressions:  - No prior study for comparison. Mild LVH with LVEF 40-45%, wall motion abnormalities as noted above. Grade  1 diastolic dysfunction with mild left atrial enlargement. Mild mitral regurgitation. Trivial tricuspid regurgitation with PASP 27 mmHg. No pericardial effusion.     ASSESSMENT AND PLAN: 1. Acute coronary syndrome: He has extensive CAD with severe stenosis of the Diagonal ostium (80-90% stenosis) and LAD proximal and mid disease with 40% stenosis and RCA with proximal 60-70% stenosis and 40% distal stenosis. He has left main disease as well. His EF is reduced, 40-45%, with wall motion abnormalities as noted above. Given LM disease, CABG is indicated. He has met with Dr. Dorris Fetch in Cardiothoracic Surgery and has agreed to undergo this surgery on Friday.  -Continue  ASA, lisinopril, coreg, atorvastatin.  -Continue Lovenox for anticoagulation. -Would hold off on SSRI or SNRI pre-op for CABG.  -P2Y12 level <  50 while on Plavix, indicating adequate response to this medication.   2. HTN: BP well controlled overnight below 140/90. Will continue the carvedilol and lisinopril for LV dysfunction and HTN. Continue Nitro drip due to continues off and on chest pain.   3. Tobacco abuse: He has a 12 pack-year smoking history but has quit before. He was smoking 6-10 cigarettes per day prior to this admission and reports no withdrawal symptoms at this time. He is determined to quit "cold [place]". Provided brief smoking cessation.   Signed,  Ky Barban, MD, [facility], PGY-II 11/03/2013 7:54 AM  Patient seen, examined. Available data reviewed. Agree with findings, assessment, and plan as outlined by Dr Garald Braver. Exam reveals an alert, oriented gentleman in no distress. His lung fields are clear. Heart is regular rate and rhythm. There is no leg edema. No ongoing signs of active ischemia. Medications reviewed. He is about to receive his last dose of Lovenox and will discontinue the drug after this dose so that there is adequate washout before CABG tomorrow.  He is otherwise on  appropriate therapies with aspirin, atorvastatin, carvedilol, isosorbide, and lisinopril.  Tonny Bollman, M.D. 11/03/2013 10:10 AM

## 2013-11-04 ENCOUNTER — Inpatient Hospital Stay (HOSPITAL_COMMUNITY): Payer: 59

## 2013-11-04 ENCOUNTER — Encounter (HOSPITAL_COMMUNITY): Payer: 59 | Admitting: Anesthesiology

## 2013-11-04 ENCOUNTER — Encounter (HOSPITAL_COMMUNITY)
Admission: EM | Disposition: A | Discharge status: Home / Self Care | Payer: 59 | Source: Home / Self Care | Attending: Cardiovascular Disease

## 2013-11-04 ENCOUNTER — Inpatient Hospital Stay (HOSPITAL_COMMUNITY): Payer: 59 | Admitting: Anesthesiology

## 2013-11-04 DIAGNOSIS — I2510 Atherosclerotic heart disease of native coronary artery without angina pectoris: Secondary | ICD-10-CM

## 2013-11-04 HISTORY — PX: CORONARY ARTERY BYPASS GRAFT: SHX141

## 2013-11-04 LAB — URINALYSIS, ROUTINE W REFLEX MICROSCOPIC
Bilirubin Urine: NEGATIVE
Glucose, UA: NEGATIVE mg/dL
Hgb urine dipstick: NEGATIVE
Ketones, ur: NEGATIVE mg/dL
Leukocytes, UA: NEGATIVE
Nitrite: NEGATIVE
Protein, ur: NEGATIVE mg/dL
Specific Gravity, Urine: 1.025 (ref 1.005–1.030)
Urobilinogen, UA: 0.2 mg/dL (ref 0.0–1.0)
pH: 6 (ref 5.0–8.0)

## 2013-11-04 LAB — POCT I-STAT 4, (NA,K, GLUC, HGB,HCT)
Glucose, Bld: 93 mg/dL (ref 70–99)
Glucose, Bld: 81 mg/dL (ref 70–99)
Glucose, Bld: 98 mg/dL (ref 70–99)
Glucose, Bld: 103 mg/dL — ABNORMAL HIGH (ref 70–99)
Glucose, Bld: 130 mg/dL — ABNORMAL HIGH (ref 70–99)
Glucose, Bld: 113 mg/dL — ABNORMAL HIGH (ref 70–99)
HCT: 35 % — ABNORMAL LOW (ref 39.0–52.0)
HCT: 24 % — ABNORMAL LOW (ref 39.0–52.0)
HCT: 31 % — ABNORMAL LOW (ref 39.0–52.0)
HCT: 23 % — ABNORMAL LOW (ref 39.0–52.0)
HCT: 23 % — ABNORMAL LOW (ref 39.0–52.0)
HCT: 26 % — ABNORMAL LOW (ref 39.0–52.0)
Hemoglobin: 11.9 g/dL — ABNORMAL LOW (ref 13.0–17.0)
Hemoglobin: 10.5 g/dL — ABNORMAL LOW (ref 13.0–17.0)
Hemoglobin: 8.2 g/dL — ABNORMAL LOW (ref 13.0–17.0)
Hemoglobin: 7.8 g/dL — ABNORMAL LOW (ref 13.0–17.0)
Hemoglobin: 7.8 g/dL — ABNORMAL LOW (ref 13.0–17.0)
Hemoglobin: 8.8 g/dL — ABNORMAL LOW (ref 13.0–17.0)
Potassium: 4.3 mEq/L (ref 3.5–5.1)
Potassium: 4.1 mEq/L (ref 3.5–5.1)
Potassium: 4.4 mEq/L (ref 3.5–5.1)
Potassium: 5.2 mEq/L — ABNORMAL HIGH (ref 3.5–5.1)
Potassium: 4.6 mEq/L (ref 3.5–5.1)
Potassium: 4 mEq/L (ref 3.5–5.1)
Sodium: 141 mEq/L (ref 135–145)
Sodium: 131 mEq/L — ABNORMAL LOW (ref 135–145)
Sodium: 140 mEq/L (ref 135–145)
Sodium: 135 mEq/L (ref 135–145)
Sodium: 139 mEq/L (ref 135–145)
Sodium: 140 mEq/L (ref 135–145)

## 2013-11-04 LAB — COMPREHENSIVE METABOLIC PANEL
ALT: 23 U/L (ref 0–53)
AST: 20 U/L (ref 0–37)
Albumin: 3.4 g/dL — ABNORMAL LOW (ref 3.5–5.2)
Alkaline Phosphatase: 75 U/L (ref 39–117)
BUN: 15 mg/dL (ref 6–23)
CO2: 27 mEq/L (ref 19–32)
Calcium: 9.1 mg/dL (ref 8.4–10.5)
Chloride: 107 mEq/L (ref 96–112)
Creatinine, Ser: 0.97 mg/dL (ref 0.50–1.35)
GFR calc Af Amer: >90 mL/min (ref >90)
GFR calc non Af Amer: 88 mL/min — ABNORMAL LOW (ref >90)
Glucose, Bld: 93 mg/dL (ref 70–99)
Potassium: 4.9 mEq/L (ref 3.5–5.1)
Sodium: 143 mEq/L (ref 135–145)
Total Bilirubin: 0.3 mg/dL (ref 0.3–1.2)
Total Protein: 6.3 g/dL (ref 6.0–8.3)

## 2013-11-04 LAB — POCT I-STAT 3, ART BLOOD GAS (G3+)
Acid-Base Excess: 1 mmol/L (ref 0.0–2.0)
Acid-Base Excess: 1 mmol/L (ref 0.0–2.0)
Acid-base deficit: 3 mmol/L — ABNORMAL HIGH (ref 0.0–2.0)
Bicarbonate: 25.3 mEq/L — ABNORMAL HIGH (ref 20.0–24.0)
Bicarbonate: 25.1 mEq/L — ABNORMAL HIGH (ref 20.0–24.0)
Bicarbonate: 23.1 mEq/L (ref 20.0–24.0)
O2 Saturation: 96 %
O2 Saturation: 100 %
O2 Saturation: 94 %
Patient temperature: 98.7
Patient temperature: 35.4
TCO2: 26 mmol/L (ref 0–100)
TCO2: 26 mmol/L (ref 0–100)
TCO2: 24 mmol/L (ref 0–100)
pCO2 arterial: 39 mmHg (ref 35.0–45.0)
pCO2 arterial: 38.8 mmHg (ref 35.0–45.0)
pCO2 arterial: 39.6 mmHg (ref 35.0–45.0)
pH, Arterial: 7.42 (ref 7.350–7.450)
pH, Arterial: 7.418 (ref 7.350–7.450)
pH, Arterial: 7.366 (ref 7.350–7.450)
pO2, Arterial: 84 mmHg (ref 80.0–100.0)
pO2, Arterial: 397 mmHg — ABNORMAL HIGH (ref 80.0–100.0)
pO2, Arterial: 66 mmHg — ABNORMAL LOW (ref 80.0–100.0)

## 2013-11-04 LAB — HEMOGLOBIN A1C
Hgb A1c MFr Bld: 5.9 % — ABNORMAL HIGH (ref <5.7)
Mean Plasma Glucose: 123 mg/dL — ABNORMAL HIGH (ref <117)

## 2013-11-04 LAB — CBC
HCT: 39.2 % (ref 39.0–52.0)
HCT: 27.8 % — ABNORMAL LOW (ref 39.0–52.0)
Hemoglobin: 13.1 g/dL (ref 13.0–17.0)
Hemoglobin: 9.5 g/dL — ABNORMAL LOW (ref 13.0–17.0)
MCH: 28.1 pg (ref 26.0–34.0)
MCH: 28 pg (ref 26.0–34.0)
MCHC: 33.4 g/dL (ref 30.0–36.0)
MCHC: 34.2 g/dL (ref 30.0–36.0)
MCV: 83.9 fL (ref 78.0–100.0)
MCV: 82 fL (ref 78.0–100.0)
Platelets: 257 10*3/uL (ref 150–400)
Platelets: 130 10*3/uL — ABNORMAL LOW (ref 150–400)
RBC: 4.67 MIL/uL (ref 4.22–5.81)
RBC: 3.39 MIL/uL — ABNORMAL LOW (ref 4.22–5.81)
RDW: 13.5 % (ref 11.5–15.5)
RDW: 13.4 % (ref 11.5–15.5)
WBC: 7.7 10*3/uL (ref 4.0–10.5)
WBC: 16.8 10*3/uL — ABNORMAL HIGH (ref 4.0–10.5)

## 2013-11-04 LAB — PROTIME-INR
INR: 1.31 (ref 0.00–1.49)
Prothrombin Time: 16 seconds — ABNORMAL HIGH (ref 11.6–15.2)

## 2013-11-04 LAB — APTT: aPTT: 35 seconds (ref 24–37)

## 2013-11-04 LAB — TYPE AND SCREEN
ABO/RH(D): O POS
Antibody Screen: NEGATIVE

## 2013-11-04 LAB — PLATELET COUNT: Platelets: 153 10*3/uL (ref 150–400)

## 2013-11-04 LAB — HEMOGLOBIN AND HEMATOCRIT, BLOOD
HCT: 24.7 % — ABNORMAL LOW (ref 39.0–52.0)
Hemoglobin: 8.3 g/dL — ABNORMAL LOW (ref 13.0–17.0)

## 2013-11-04 LAB — ABO/RH: ABO/RH(D): O POS

## 2013-11-04 IMAGING — CR DG CHEST 1V PORT
1 series · 1 of 1 positions shown · non-contrast
Comparison: [DATE]

CLINICAL DATA: Post CABG surgery

EXAM:
PORTABLE CHEST - 1 VIEW

[AP]
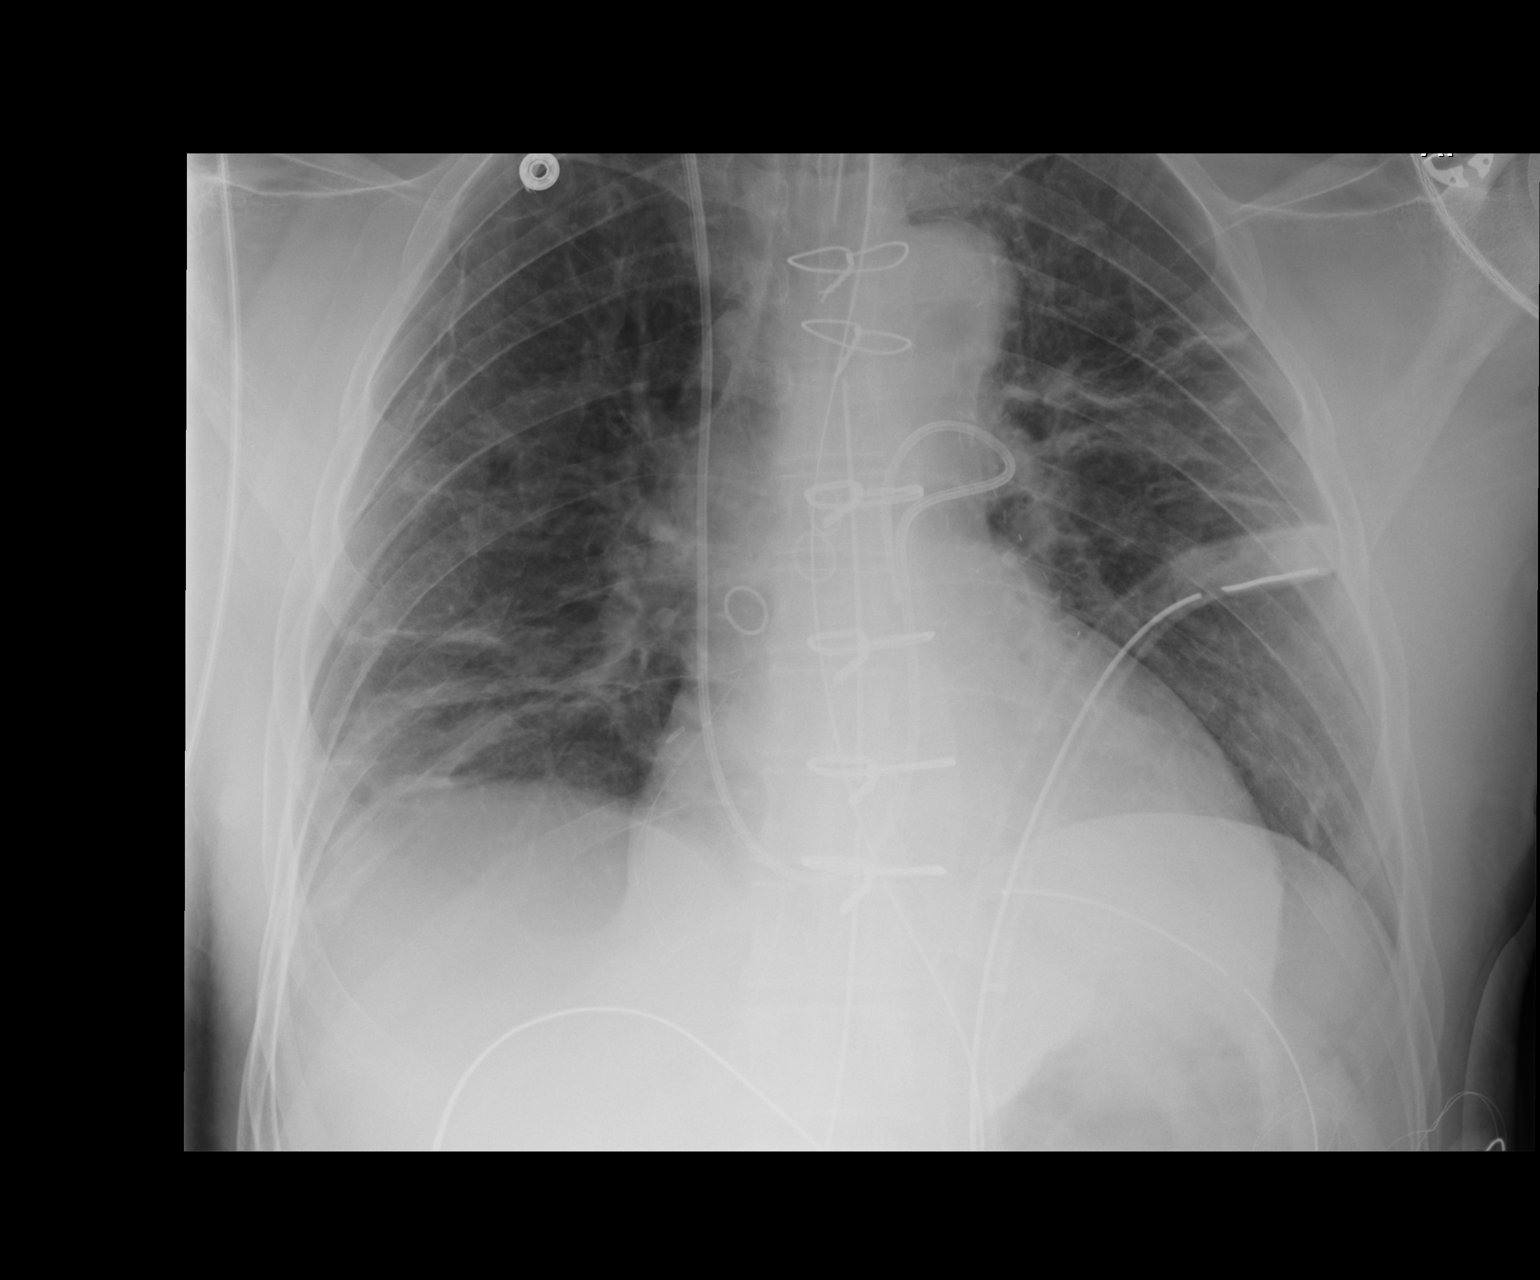

[1 of 1 positions shown; findings below may reference images not displayed]

FINDINGS: Since the prior study, CABG surgery has been performed. Standard
lines and tubes are in place, including: An endotracheal tube with
its tip 3.3 cm above the carina, a right internal jugular Swan-Ganz
catheter, which has its tip curling in the main pulmonary artery, a
nasogastric tube passing below the diaphragm into stomach, a
mediastinal tube and a left chest tube.

The cardiac silhouette is mildly enlarged. The mediastinum is normal
in contour with no widening.

There are regular linear opacities in the right lower lung zone and
left mid lung most likely atelectasis. No overt pulmonary edema.
IMPRESSION: 1. Status post CABG surgery.
2. Swan-Ganz catheter tip curls on itself within the main pulmonary
artery. Remaining support apparatus is well positioned as detailed.
3. No mediastinal widening or pulmonary edema.
4. Irregular linear areas of lung opacity most consistent with
atelectasis.

## 2013-11-04 SURGERY — CORONARY ARTERY BYPASS GRAFTING (CABG)
Anesthesia: General | Site: Chest | Wound class: Clean

## 2013-11-04 MED ORDER — ALBUMIN HUMAN 5 % IV SOLN
250.0000 mL | INTRAVENOUS | Status: AC | PRN
  Administered 2013-11-04 – 2013-11-05 (×3): 250 mL via INTRAVENOUS
  Filled 2013-11-04: qty 250

## 2013-11-04 MED ORDER — MIDAZOLAM HCL 2 MG/2ML IJ SOLN
2.0000 mg | INTRAMUSCULAR | Status: DC | PRN
Start: 2013-11-04 — End: 2013-11-05
  Administered 2013-11-04 (×3): 2 mg via INTRAVENOUS
  Filled 2013-11-04 (×3): qty 2

## 2013-11-04 MED ORDER — ONDANSETRON HCL 4 MG/2ML IJ SOLN
4.0000 mg | Freq: Four times a day (QID) | INTRAMUSCULAR | Status: DC | PRN
  Administered 2013-11-04: 4 mg via INTRAVENOUS
  Filled 2013-11-04: qty 2

## 2013-11-04 MED ORDER — MIDAZOLAM HCL 5 MG/ML IJ SOLN: 2.0000 mg | Freq: Once | INTRAMUSCULAR | Status: DC

## 2013-11-04 MED ORDER — ACETAMINOPHEN 500 MG PO TABS
1000.0000 mg | ORAL_TABLET | Freq: Four times a day (QID) | ORAL | Status: DC
  Administered 2013-11-05 – 2013-11-06 (×6): 1000 mg via ORAL
  Filled 2013-11-04 (×10): qty 2

## 2013-11-04 MED ORDER — ROCURONIUM BROMIDE 100 MG/10ML IV SOLN
INTRAVENOUS | Status: DC | PRN
  Administered 2013-11-04 (×2): 50 mg via INTRAVENOUS
  Administered 2013-11-04: 20 mg via INTRAVENOUS
  Administered 2013-11-04: 30 mg via INTRAVENOUS

## 2013-11-04 MED ORDER — DEXTROSE 5 % IV SOLN
1.5000 g | Freq: Two times a day (BID) | INTRAVENOUS | Status: AC
  Administered 2013-11-05 – 2013-11-06 (×4): 1.5 g via INTRAVENOUS
  Filled 2013-11-04 (×4): qty 1.5

## 2013-11-04 MED ORDER — BISACODYL 10 MG RE SUPP: 10.0000 mg | Freq: Every day | RECTAL | Status: DC

## 2013-11-04 MED ORDER — MORPHINE SULFATE 2 MG/ML IJ SOLN
1.0000 mg | INTRAMUSCULAR | Status: AC | PRN
  Administered 2013-11-04 (×2): 2 mg via INTRAVENOUS
  Administered 2013-11-04: 4 mg via INTRAVENOUS
  Filled 2013-11-04: qty 2

## 2013-11-04 MED ORDER — LACTATED RINGERS IV SOLN: 500.0000 mL | Freq: Once | INTRAVENOUS | Status: AC | PRN

## 2013-11-04 MED ORDER — PROTAMINE SULFATE 10 MG/ML IV SOLN
INTRAVENOUS | Status: DC | PRN
  Administered 2013-11-04: 270 mg via INTRAVENOUS

## 2013-11-04 MED ORDER — PANTOPRAZOLE SODIUM 40 MG PO TBEC
40.0000 mg | DELAYED_RELEASE_TABLET | Freq: Every day | ORAL | Status: DC
  Administered 2013-11-06: 40 mg via ORAL
  Filled 2013-11-04 (×2): qty 1

## 2013-11-04 MED ORDER — MIDAZOLAM HCL 5 MG/5ML IJ SOLN
INTRAMUSCULAR | Status: DC | PRN
  Administered 2013-11-04: 6 mg via INTRAVENOUS
  Administered 2013-11-04: 2 mg via INTRAVENOUS
  Administered 2013-11-04: 3 mg via INTRAVENOUS
  Administered 2013-11-04 (×2): 2 mg via INTRAVENOUS

## 2013-11-04 MED ORDER — MORPHINE SULFATE 2 MG/ML IJ SOLN
2.0000 mg | INTRAMUSCULAR | Status: DC | PRN
  Administered 2013-11-05 (×2): 2 mg via INTRAVENOUS
  Administered 2013-11-05: 4 mg via INTRAVENOUS
  Administered 2013-11-05 – 2013-11-06 (×5): 2 mg via INTRAVENOUS
  Filled 2013-11-04: qty 1
  Filled 2013-11-04 (×2): qty 2
  Filled 2013-11-04: qty 1
  Filled 2013-11-04: qty 2
  Filled 2013-11-04 (×2): qty 1
  Filled 2013-11-04: qty 2

## 2013-11-04 MED ORDER — SODIUM CHLORIDE 0.9 % IV SOLN
INTRAVENOUS | Status: DC
  Administered 2013-11-04: 0.5 [IU]/h via INTRAVENOUS
  Filled 2013-11-04: qty 1

## 2013-11-04 MED ORDER — LACTATED RINGERS IV SOLN
INTRAVENOUS | Status: DC | PRN
  Administered 2013-11-04: 13:00:00 via INTRAVENOUS

## 2013-11-04 MED ORDER — INSULIN REGULAR BOLUS VIA INFUSION
0.0000 [IU] | Freq: Three times a day (TID) | INTRAVENOUS | Status: DC
  Filled 2013-11-04: qty 10

## 2013-11-04 MED ORDER — MIDAZOLAM HCL 2 MG/2ML IJ SOLN
INTRAMUSCULAR | Status: AC
  Administered 2013-11-04: 2 mg via INTRAVENOUS
  Filled 2013-11-04: qty 2

## 2013-11-04 MED ORDER — PROPOFOL 10 MG/ML IV BOLUS
INTRAVENOUS | Status: DC | PRN
  Administered 2013-11-04: 20 mg via INTRAVENOUS

## 2013-11-04 MED ORDER — LACTATED RINGERS IV SOLN
INTRAVENOUS | Status: DC
  Administered 2013-11-04: 20 mL/h via INTRAVENOUS

## 2013-11-04 MED ORDER — ACETAMINOPHEN 160 MG/5ML PO SOLN
1000.0000 mg | Freq: Four times a day (QID) | ORAL | Status: DC
  Administered 2013-11-04: 1000 mg
  Filled 2013-11-04: qty 40

## 2013-11-04 MED ORDER — SODIUM CHLORIDE 0.9 % IJ SOLN
OROMUCOSAL | Status: DC | PRN
  Administered 2013-11-04 (×3): via TOPICAL

## 2013-11-04 MED ORDER — ASPIRIN 81 MG PO CHEW
324.0000 mg | CHEWABLE_TABLET | Freq: Every day | ORAL | Status: DC
  Administered 2013-11-05: 324 mg
  Filled 2013-11-04: qty 4

## 2013-11-04 MED ORDER — 0.9 % SODIUM CHLORIDE (POUR BTL) OPTIME
TOPICAL | Status: DC | PRN
  Administered 2013-11-04: 6000 mL

## 2013-11-04 MED ORDER — METOPROLOL TARTRATE 25 MG/10 ML ORAL SUSPENSION
12.5000 mg | Freq: Two times a day (BID) | ORAL | Status: DC
  Filled 2013-11-04 (×3): qty 5

## 2013-11-04 MED ORDER — FENTANYL CITRATE 0.05 MG/ML IJ SOLN
INTRAMUSCULAR | Status: DC | PRN
  Administered 2013-11-04 (×2): 250 ug via INTRAVENOUS
  Administered 2013-11-04: 1000 ug via INTRAVENOUS

## 2013-11-04 MED ORDER — ISOSORBIDE MONONITRATE ER 30 MG PO TB24
30.0000 mg | ORAL_TABLET | Freq: Every day | ORAL | Status: DC
  Filled 2013-11-04: qty 1

## 2013-11-04 MED ORDER — SODIUM CHLORIDE 0.45 % IV SOLN
INTRAVENOUS | Status: DC
  Administered 2013-11-04: 20 mL/h via INTRAVENOUS

## 2013-11-04 MED ORDER — FAMOTIDINE IN NACL 20-0.9 MG/50ML-% IV SOLN
20.0000 mg | Freq: Two times a day (BID) | INTRAVENOUS | Status: AC
  Administered 2013-11-04 – 2013-11-05 (×2): 20 mg via INTRAVENOUS
  Filled 2013-11-04: qty 50

## 2013-11-04 MED ORDER — ALBUMIN HUMAN 5 % IV SOLN
INTRAVENOUS | Status: DC | PRN
  Administered 2013-11-04: 18:00:00 via INTRAVENOUS

## 2013-11-04 MED ORDER — SODIUM CHLORIDE 0.9 % IJ SOLN
3.0000 mL | Freq: Two times a day (BID) | INTRAMUSCULAR | Status: DC
  Administered 2013-11-05 – 2013-11-06 (×3): 3 mL via INTRAVENOUS

## 2013-11-04 MED ORDER — ASPIRIN EC 325 MG PO TBEC
325.0000 mg | DELAYED_RELEASE_TABLET | Freq: Every day | ORAL | Status: DC
  Administered 2013-11-06: 325 mg via ORAL
  Filled 2013-11-04 (×2): qty 1

## 2013-11-04 MED ORDER — PHENYLEPHRINE HCL 10 MG/ML IJ SOLN
0.0000 ug/min | INTRAVENOUS | Status: DC
  Administered 2013-11-04: 3 ug/min via INTRAVENOUS
  Filled 2013-11-04: qty 2

## 2013-11-04 MED ORDER — SODIUM CHLORIDE 0.9 % IV SOLN: 250.0000 mL | INTRAVENOUS | Status: DC

## 2013-11-04 MED ORDER — METOPROLOL TARTRATE 1 MG/ML IV SOLN: 2.5000 mg | INTRAVENOUS | Status: DC | PRN

## 2013-11-04 MED ORDER — ACETAMINOPHEN 650 MG RE SUPP: 650.0000 mg | Freq: Once | RECTAL | Status: DC

## 2013-11-04 MED ORDER — BISACODYL 5 MG PO TBEC
10.0000 mg | DELAYED_RELEASE_TABLET | Freq: Every day | ORAL | Status: DC
  Administered 2013-11-05 – 2013-11-06 (×2): 10 mg via ORAL
  Filled 2013-11-04 (×3): qty 2

## 2013-11-04 MED ORDER — NITROGLYCERIN IN D5W 200-5 MCG/ML-% IV SOLN
0.0000 ug/min | INTRAVENOUS | Status: DC
  Administered 2013-11-04: 5 ug/min via INTRAVENOUS

## 2013-11-04 MED ORDER — FENTANYL CITRATE 0.05 MG/ML IJ SOLN
INTRAMUSCULAR | Status: AC
  Administered 2013-11-04: 100 ug via INTRAVENOUS
  Filled 2013-11-04: qty 2

## 2013-11-04 MED ORDER — DEXMEDETOMIDINE HCL IN NACL 200 MCG/50ML IV SOLN
0.1000 ug/kg/h | INTRAVENOUS | Status: DC
  Administered 2013-11-04: 0.7 ug/kg/h via INTRAVENOUS
  Filled 2013-11-04: qty 50

## 2013-11-04 MED ORDER — HEPARIN SODIUM (PORCINE) 1000 UNIT/ML IJ SOLN
INTRAMUSCULAR | Status: DC | PRN
  Administered 2013-11-04: 2000 [IU] via INTRAVENOUS
  Administered 2013-11-04: 30000 [IU] via INTRAVENOUS

## 2013-11-04 MED ORDER — DOCUSATE SODIUM 100 MG PO CAPS
200.0000 mg | ORAL_CAPSULE | Freq: Every day | ORAL | Status: DC
  Administered 2013-11-05 – 2013-11-06 (×2): 200 mg via ORAL
  Filled 2013-11-04 (×3): qty 2

## 2013-11-04 MED ORDER — SODIUM CHLORIDE 0.9 % IJ SOLN: 3.0000 mL | INTRAMUSCULAR | Status: DC | PRN

## 2013-11-04 MED ORDER — VANCOMYCIN HCL IN DEXTROSE 1-5 GM/200ML-% IV SOLN
1000.0000 mg | Freq: Once | INTRAVENOUS | Status: AC
  Administered 2013-11-05: 1000 mg via INTRAVENOUS
  Filled 2013-11-04: qty 200

## 2013-11-04 MED ORDER — OXYCODONE HCL 5 MG PO TABS
5.0000 mg | ORAL_TABLET | ORAL | Status: DC | PRN
  Administered 2013-11-05 – 2013-11-06 (×7): 10 mg via ORAL
  Filled 2013-11-04 (×7): qty 2

## 2013-11-04 MED ORDER — ACETAMINOPHEN 160 MG/5ML PO SOLN
650.0000 mg | Freq: Once | ORAL | Status: DC
  Filled 2013-11-04: qty 20.3

## 2013-11-04 MED ORDER — MAGNESIUM SULFATE 40 MG/ML IJ SOLN
4.0000 g | Freq: Once | INTRAMUSCULAR | Status: AC
  Administered 2013-11-04: 4 g via INTRAVENOUS
  Filled 2013-11-04: qty 100

## 2013-11-04 MED ORDER — FENTANYL CITRATE 0.05 MG/ML IJ SOLN
100.0000 ug | Freq: Once | INTRAMUSCULAR | Status: AC
  Administered 2013-11-04: 100 ug via INTRAVENOUS

## 2013-11-04 MED ORDER — HEMOSTATIC AGENTS (NO CHARGE) OPTIME
TOPICAL | Status: DC | PRN
  Administered 2013-11-04: 1 via TOPICAL

## 2013-11-04 MED ORDER — INSULIN ASPART 100 UNIT/ML ~~LOC~~ SOLN
0.0000 [IU] | SUBCUTANEOUS | Status: DC
  Administered 2013-11-04 – 2013-11-05 (×2): 2 [IU] via SUBCUTANEOUS

## 2013-11-04 MED ORDER — METOPROLOL TARTRATE 12.5 MG HALF TABLET
12.5000 mg | ORAL_TABLET | Freq: Two times a day (BID) | ORAL | Status: DC
  Filled 2013-11-04 (×3): qty 1

## 2013-11-04 MED ORDER — POTASSIUM CHLORIDE 10 MEQ/50ML IV SOLN
10.0000 meq | INTRAVENOUS | Status: AC
  Administered 2013-11-04 (×2): 10 meq via INTRAVENOUS

## 2013-11-04 MED ORDER — SODIUM CHLORIDE 0.9 % IV SOLN
INTRAVENOUS | Status: DC
  Administered 2013-11-04: 10 mL/h via INTRAVENOUS

## 2013-11-04 MED ORDER — ATORVASTATIN CALCIUM 80 MG PO TABS
80.0000 mg | ORAL_TABLET | Freq: Every day | ORAL | Status: DC
  Administered 2013-11-05 – 2013-11-07 (×3): 80 mg via ORAL
  Filled 2013-11-04 (×4): qty 1

## 2013-11-04 SURGICAL SUPPLY — 89 items
ATTRACTOMAT 16X20 MAGNETIC DRP (DRAPES) ×2 IMPLANT
BAG DECANTER FOR FLEXI CONT (MISCELLANEOUS) ×2 IMPLANT
BANDAGE ELASTIC 4 VELCRO ST LF (GAUZE/BANDAGES/DRESSINGS) ×3 IMPLANT
BANDAGE ELASTIC 6 VELCRO ST LF (GAUZE/BANDAGES/DRESSINGS) ×3 IMPLANT
BANDAGE GAUZE ELAST BULKY 4 IN (GAUZE/BANDAGES/DRESSINGS) ×3 IMPLANT
BASKET HEART (ORDER IN 25'S) (MISCELLANEOUS) ×1
BASKET HEART (ORDER IN 25S) (MISCELLANEOUS) ×1 IMPLANT
BLADE STERNUM SYSTEM 6 (BLADE) ×2 IMPLANT
CANISTER SUCTION 2500CC (MISCELLANEOUS) ×2 IMPLANT
CANNULA EZ GLIDE AORTIC 21FR (CANNULA) ×2 IMPLANT
CANNULA VENOUS LOW PROF 34X46 (CANNULA) IMPLANT
CANNULA VESSEL 3MM BLUNT TIP (CANNULA) ×3 IMPLANT
CATH CPB KIT HENDRICKSON (MISCELLANEOUS) ×2 IMPLANT
CATH ROBINSON RED A/P 18FR (CATHETERS) ×2 IMPLANT
CATH THORACIC 36FR (CATHETERS) ×2 IMPLANT
CATH THORACIC 36FR RT ANG (CATHETERS) ×2 IMPLANT
CLIP FOGARTY SPRING 6M (CLIP) ×2 IMPLANT
CLIP TI MEDIUM 24 (CLIP) IMPLANT
CLIP TI WIDE RED SMALL 24 (CLIP) ×2 IMPLANT
COVER SURGICAL LIGHT HANDLE (MISCELLANEOUS) ×2 IMPLANT
CRADLE DONUT ADULT HEAD (MISCELLANEOUS) ×2 IMPLANT
DRAPE CARDIOVASCULAR INCISE (DRAPES) ×2
DRAPE SLUSH/WARMER DISC (DRAPES) ×2 IMPLANT
DRAPE SRG 135X102X78XABS (DRAPES) ×1 IMPLANT
DRSG COVADERM 4X14 (GAUZE/BANDAGES/DRESSINGS) ×2 IMPLANT
ELECT REM PT RETURN 9FT ADLT (ELECTROSURGICAL) ×4
ELECTRODE REM PT RTRN 9FT ADLT (ELECTROSURGICAL) ×2 IMPLANT
GLOVE BIO SURGEON STRL SZ 6.5 (GLOVE) ×4 IMPLANT
GLOVE BIO SURGEON STRL SZ7.5 (GLOVE) ×2 IMPLANT
GLOVE BIOGEL PI IND STRL 6 (GLOVE) IMPLANT
GLOVE BIOGEL PI IND STRL 6.5 (GLOVE) IMPLANT
GLOVE BIOGEL PI INDICATOR 6 (GLOVE) ×2
GLOVE BIOGEL PI INDICATOR 6.5 (GLOVE) ×2
GLOVE EUDERMIC 7 POWDERFREE (GLOVE) ×6 IMPLANT
GOWN PREVENTION PLUS XLARGE (GOWN DISPOSABLE) ×4 IMPLANT
GOWN STRL NON-REIN LRG LVL3 (GOWN DISPOSABLE) ×10 IMPLANT
HEMOSTAT POWDER SURGIFOAM 1G (HEMOSTASIS) ×6 IMPLANT
HEMOSTAT SURGICEL 2X14 (HEMOSTASIS) ×2 IMPLANT
INSERT FOGARTY XLG (MISCELLANEOUS) IMPLANT
KIT BASIN OR (CUSTOM PROCEDURE TRAY) ×2 IMPLANT
KIT ROOM TURNOVER OR (KITS) ×2 IMPLANT
KIT SUCTION CATH 14FR (SUCTIONS) ×4 IMPLANT
KIT VASOVIEW ACCESSORY VH 2004 (KITS) ×1 IMPLANT
KIT VASOVIEW W/TROCAR VH 2000 (KITS) ×2 IMPLANT
MARKER GRAFT CORONARY BYPASS (MISCELLANEOUS) ×6 IMPLANT
NS IRRIG 1000ML POUR BTL (IV SOLUTION) ×11 IMPLANT
PACK OPEN HEART (CUSTOM PROCEDURE TRAY) ×2 IMPLANT
PAD ARMBOARD 7.5X6 YLW CONV (MISCELLANEOUS) ×4 IMPLANT
PAD ELECT DEFIB RADIOL ZOLL (MISCELLANEOUS) ×2 IMPLANT
PENCIL BUTTON HOLSTER BLD 10FT (ELECTRODE) ×2 IMPLANT
PUNCH AORTIC ROTATE 4.0MM (MISCELLANEOUS) IMPLANT
PUNCH AORTIC ROTATE 4.5MM 8IN (MISCELLANEOUS) ×1 IMPLANT
PUNCH AORTIC ROTATE 5MM 8IN (MISCELLANEOUS) IMPLANT
SENSOR MYOCARDIAL TEMP (MISCELLANEOUS) ×1 IMPLANT
SET CARDIOPLEGIA MPS 5001102 (MISCELLANEOUS) ×1 IMPLANT
SPONGE GAUZE 4X4 12PLY (GAUZE/BANDAGES/DRESSINGS) ×6 IMPLANT
SUT BONE WAX W31G (SUTURE) ×2 IMPLANT
SUT MNCRL AB 4-0 PS2 18 (SUTURE) IMPLANT
SUT PROLENE 3 0 SH DA (SUTURE) ×2 IMPLANT
SUT PROLENE 4 0 RB 1 (SUTURE)
SUT PROLENE 4 0 SH DA (SUTURE) IMPLANT
SUT PROLENE 4-0 RB1 .5 CRCL 36 (SUTURE) IMPLANT
SUT PROLENE 6 0 C 1 30 (SUTURE) ×8 IMPLANT
SUT PROLENE 7 0 BV1 MDA (SUTURE) ×3 IMPLANT
SUT PROLENE 8 0 BV175 6 (SUTURE) ×2 IMPLANT
SUT SILK  1 MH (SUTURE)
SUT SILK 1 MH (SUTURE) IMPLANT
SUT STEEL 6MS V (SUTURE) ×1 IMPLANT
SUT STEEL STERNAL CCS#1 18IN (SUTURE) IMPLANT
SUT STEEL SZ 6 DBL 3X14 BALL (SUTURE) ×2 IMPLANT
SUT VIC AB 1 CTX 36 (SUTURE) ×4
SUT VIC AB 1 CTX36XBRD ANBCTR (SUTURE) ×2 IMPLANT
SUT VIC AB 2-0 CT1 27 (SUTURE) ×4
SUT VIC AB 2-0 CT1 TAPERPNT 27 (SUTURE) IMPLANT
SUT VIC AB 2-0 CTX 27 (SUTURE) IMPLANT
SUT VIC AB 3-0 SH 27 (SUTURE)
SUT VIC AB 3-0 SH 27X BRD (SUTURE) IMPLANT
SUT VIC AB 3-0 X1 27 (SUTURE) ×2 IMPLANT
SUT VICRYL 4-0 PS2 18IN ABS (SUTURE) IMPLANT
SUTURE E-PAK OPEN HEART (SUTURE) ×2 IMPLANT
SYSTEM SAHARA CHEST DRAIN ATS (WOUND CARE) ×2 IMPLANT
TAPE CLOTH SURG 4X10 WHT LF (GAUZE/BANDAGES/DRESSINGS) ×3 IMPLANT
TOWEL OR 17X24 6PK STRL BLUE (TOWEL DISPOSABLE) ×4 IMPLANT
TOWEL OR 17X26 10 PK STRL BLUE (TOWEL DISPOSABLE) ×4 IMPLANT
TRAY FOLEY IC TEMP SENS 14FR (CATHETERS) ×2 IMPLANT
TUBE FEEDING 8FR 16IN STR KANG (MISCELLANEOUS) ×2 IMPLANT
TUBING INSUFFLATION 10FT LAP (TUBING) ×2 IMPLANT
UNDERPAD 30X30 INCONTINENT (UNDERPADS AND DIAPERS) ×2 IMPLANT
WATER STERILE IRR 1000ML POUR (IV SOLUTION) ×4 IMPLANT

## 2013-11-04 NOTE — Progress Notes (Signed)
Pt's ring taken off by pt and he gave to wife at bedside; witnessed by transporter as well;

## 2013-11-04 NOTE — Preoperative (Signed)
Beta Blockers   Reason not to administer Beta Blockers:Not Applicable 

## 2013-11-04 NOTE — Progress Notes (Signed)
SUBJECTIVE:  He has had no chest pain overnight. No complaints today.       Intake/Output Summary (Last 24 hours) at 11/04/13 0902 Last data filed at 11/04/13 0800  Gross per 24 hour  Intake  854.5 ml  Output   1700 ml  Net -845.5 ml    Current Facility-Administered Medications  Medication Dose Route Frequency Provider Last Rate Last Dose  . 0.9 %  sodium chloride infusion   Intravenous Continuous Vida Roller, MD 10 mL/hr at 11/04/13 0700    . acetaminophen (TYLENOL) tablet 650 mg  650 mg Oral Q4H PRN Kathleene Hazel, MD      . ALPRAZolam Prudy Feeler) tablet 0.25-0.5 mg  0.25-0.5 mg Oral Q4H PRN Loreli Slot, MD      . aminocaproic acid (AMICAR) 10 g in sodium chloride 0.9 % 100 mL infusion   Intravenous To OR Kathleene Hazel, MD      . aspirin chewable tablet 81 mg  81 mg Oral Daily Kathleene Hazel, MD   81 mg at 11/03/13 0945  . atorvastatin (LIPITOR) tablet 80 mg  80 mg Oral q1800 Kathleene Hazel, MD   80 mg at 11/03/13 1729  . bisacodyl (DULCOLAX) EC tablet 5 mg  5 mg Oral Once Loreli Slot, MD      . carvedilol (COREG) tablet 3.125 mg  3.125 mg Oral BID WC Laqueta Linden, MD   3.125 mg at 11/03/13 1729  . cefUROXime (ZINACEF) 1.5 g in dextrose 5 % 50 mL IVPB  1.5 g Intravenous To OR Kathleene Hazel, MD      . cefUROXime (ZINACEF) 750 mg in dextrose 5 % 50 mL IVPB  750 mg Intravenous To OR Kathleene Hazel, MD      . dexmedetomidine (PRECEDEX) 400 MCG/100ML infusion  0.1-0.7 mcg/kg/hr Intravenous To OR Kathleene Hazel, MD      . diazepam (VALIUM) tablet 5 mg  5 mg Oral Once Loreli Slot, MD      . DOPamine (INTROPIN) 800 mg in dextrose 5 % 250 mL infusion  2-20 mcg/kg/min Intravenous To OR Kathleene Hazel, MD      . EPINEPHrine (ADRENALIN) 4,000 mcg in dextrose 5 % 250 mL infusion  0.5-20 mcg/min Intravenous To OR Kathleene Hazel, MD      . heparin 2,500 Units, papaverine 30 mg in  electrolyte-148 (PLASMALYTE-148) 500 mL irrigation   Irrigation To OR Kathleene Hazel, MD      . heparin 30,000 units/NS 1000 mL solution for CELLSAVER   Other To OR Kathleene Hazel, MD      . insulin regular (NOVOLIN R,HUMULIN R) 1 Units/mL in sodium chloride 0.9 % 100 mL infusion   Intravenous To OR Kathleene Hazel, MD      . isosorbide mononitrate (IMDUR) 24 hr tablet 30 mg  30 mg Oral Daily Laqueta Linden, MD   30 mg at 11/03/13 0948  . lisinopril (PRINIVIL,ZESTRIL) tablet 5 mg  5 mg Oral Daily Laqueta Linden, MD   5 mg at 11/03/13 0948  . magnesium sulfate (IV Push/IM) injection 40 mEq  40 mEq Other To OR Kathleene Hazel, MD      . morphine 2 MG/ML injection 2 mg  2 mg Intravenous Q1H PRN Kathleene Hazel, MD      . nitroGLYCERIN (NITROSTAT) SL tablet 0.4 mg  0.4 mg Sublingual Q5 Min x 3 PRN Kathleene Hazel, MD  0.4 mg at 10/31/13 0714  . nitroGLYCERIN 0.2 mg/mL in dextrose 5 % infusion  2-200 mcg/min Intravenous Continuous Kathleene Hazel, MD 1.5 mL/hr at 11/03/13 1004 5 mcg/min at 11/03/13 1004  . nitroGLYCERIN 0.2 mg/mL in dextrose 5 % infusion  2-200 mcg/min Intravenous To OR Kathleene Hazel, MD 1.5 mL/hr at 11/04/13 0800 5 mcg/min at 11/04/13 0800  . ondansetron (ZOFRAN) injection 4 mg  4 mg Intravenous Q6H PRN Kathleene Hazel, MD      . oxyCODONE-acetaminophen (PERCOCET/ROXICET) 5-325 MG per tablet 1-2 tablet  1-2 tablet Oral Q4H PRN Kathleene Hazel, MD   2 tablet at 11/01/13 1006  . phenylephrine (NEO-SYNEPHRINE) 20 mg in dextrose 5 % 250 mL infusion  30-200 mcg/min Intravenous To OR Kathleene Hazel, MD      . potassium chloride injection 80 mEq  80 mEq Other To OR Kathleene Hazel, MD      . vancomycin (VANCOCIN) 1,250 mg in sodium chloride 0.9 % 250 mL IVPB  1,250 mg Intravenous To OR Kathleene Hazel, MD        Filed Vitals:   11/03/13 2000 11/04/13 0000 11/04/13 0400 11/04/13 0800   BP: 118/54 134/65 124/65 122/83  Pulse:      Temp: 98.3 F (36.8 C) 98.1 F (36.7 C) 98.2 F (36.8 C) 97.7 F (36.5 C)  TempSrc: Oral Oral Oral Oral  Resp: 15 14 12 14   Height:      Weight:      SpO2: 97% 97% 98% 99%    PHYSICAL EXAM General: Pleasant, sitting in bed, in NAD.  Neck: No JVD.   Lungs: Clear to auscultation bilaterally, no respiratory distress. . CV: Nondisplaced PMI.  Regular rhythm, normal S1/S2, no S3/S4, no murmur.  No pretibial edema.  No carotid bruit.  Pedal pulses 2+ and equal bilaterally.   Abdomen: Soft, nontender, no hepatosplenomegaly, no distention.  Extremities: No clubbing or cyanosis. No edema. Right groin area at cath insertion site, with no hematoma covered with dressing that is c/d/i.  Neurologic: Alert and oriented x 3.  Psych: Normal affect.   Telemetry: Normal sinus rhythm   LABS: Basic Metabolic Panel:  Recent Labs  16/10/96 0606 11/04/13 0424  NA 138 143  K 4.1 4.9  CL 104 107  CO2 23 27  GLUCOSE 96 93  BUN 16 15  CREATININE 0.98 0.97  CALCIUM 9.2 9.1   Liver Function Tests:  Recent Labs  11/04/13 0424  AST 20  ALT 23  ALKPHOS 75  BILITOT 0.3  PROT 6.3  ALBUMIN 3.4*   No results found for this basename: LIPASE, AMYLASE,  in the last 72 hours CBC:  Recent Labs  11/03/13 0414 11/04/13 0424  WBC 8.1 7.7  HGB 13.0 13.1  HCT 38.2* 39.2  MCV 81.6 83.9  PLT 259 257   Cardiac Enzymes: No results found for this basename: CKTOTAL, CKMB, CKMBINDEX, TROPONINI,  in the last 72 hours BNP: No components found with this basename: POCBNP,  D-Dimer: No results found for this basename: DDIMER,  in the last 72 hours Hemoglobin A1C: No results found for this basename: HGBA1C,  in the last 72 hours Fasting Lipid Panel: No results found for this basename: CHOL, HDL, LDLCALC, TRIG, CHOLHDL, LDLDIRECT,  in the last 72 hours Thyroid Function Tests: No results found for this basename: TSH, T4TOTAL, FREET3, T3FREE, THYROIDAB,   in the last 72 hours Anemia Panel: No results found for this basename: VITAMINB12, FOLATE, FERRITIN, TIBC,  IRON, RETICCTPCT,  in the last 72 hours  RADIOLOGY: Dg Chest Port 1 View  10/29/2013   CLINICAL DATA:  Chest pain.  EXAM: PORTABLE CHEST - 1 VIEW  COMPARISON:  Chest radiograph performed 10/19/2008  FINDINGS: The lungs are well-aerated and clear. There is no evidence of focal opacification, pleural effusion or pneumothorax.  The cardiomediastinal silhouette is within normal limits. No acute osseous abnormalities are seen.  IMPRESSION: No acute cardiopulmonary process seen.   Electronically Signed   By: Roanna Raider M.D.   On: 10/29/2013 06:43   ECHO: Study Conclusions  - Left ventricle: The cavity size was normal. There was mild concentric hypertrophy. Systolic function was mildly to moderately reduced. The estimated ejection fraction was in the range of 40% to 45%. There is hypokinesis of the distalanterolateral myocardium. There is severe hypokinesis to akinesis of the mid-distal anteroseptal myocardium. There is severe hypokinesis toakinesis of the distal inferior myocardium. Doppler parameters are consistent with abnormal left ventricular relaxation (grade 1 diastolic dysfunction). - Aortic valve: Mildly calcified annulus. Trileaflet; mildly calcified leaflets. No significant regurgitation. - Mitral valve: Mild regurgitation. - Left atrium: The atrium was mildly dilated. - Right atrium: Central venous pressure: 3mm Hg (est). - Tricuspid valve: Trivial regurgitation. - Pulmonary arteries: PA peak pressure: 27mm Hg (S). - Pericardium, extracardiac: There was no pericardial effusion. Impressions:  - No prior study for comparison. Mild LVH with LVEF 40-45%, wall motion abnormalities as noted above. Grade 1 diastolic dysfunction with mild left atrial enlargement. Mild mitral regurgitation. Trivial tricuspid regurgitation with PASP 27 mmHg. No pericardial  effusion.    ASSESSMENT AND PLAN: 1. Acute coronary syndrome: He has extensive CAD with severe stenosis of the Diagonal ostium (80-90% stenosis) and LAD proximal and mid disease with 40% stenosis and RCA with proximal 60-70% stenosis and 40% distal stenosis. He has left main disease as well. His EF is reduced, 40-45%, with wall motion abnormalities as noted above. Given LM disease, CABG is indicated. He has met with Dr. Dorris Fetch in Cardiothoracic Surgery and has agreed to undergo this surgery today.  -Continue  ASA, lisinopril, coreg, Imdur, atorvastatin.  -Lovenox for anticoagulation held prior to CABG. -Would hold off on SSRI or SNRI pre-op for CABG.  -P2Y12 level <50 while on Plavix, indicating adequate response to this medication.   2. HTN: BP well controlled overnight below 140/90. Will continue the carvedilol and lisinopril for LV dysfunction and HTN. Continue Nitro drip due to continues off and on chest pain.   3. Tobacco abuse: He has a 12 pack-year smoking history but has quit before. He was smoking 6-10 cigarettes per day prior to this admission and reports no withdrawal symptoms at this time. He is determined to quit "cold [place]". Provided brief smoking cessation.   Signed,  Ky Barban, MD, [facility], PGY-II 11/04/2013 9:02 AM  History and all data above reviewed.  Patient examined.  I agree with the findings as above.  The patient exam reveals COR  RRR  ,  Lungs: Clear  ,  Abd: Positive bowel sounds, no rebound no guarding, Ext No edema  .  All available labs, radiology testing, previous records reviewed. Agree with documented assessment and plan. No chest pain.  For CABG today.  BP OK.  Continue current therapy.   Rollene Rotunda  9:18 AM  11/04/2013

## 2013-11-04 NOTE — H&P (View-Only) (Signed)
Reason for Consult:Left main disease, unstable coronary syndrome Referring Physician: Dr. Isac Caddy Lonnie [name] is an 62 y.o. male.  HPI: 62 man with a history of tobacco abuse and hypertension presented 10/8 with a cc/o CP  Lonnie [name] is a 62 yo gentleman with no prior cardiac history. He does have a history of hypertension and smokes about 1/2 ppd. 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 He was in his usual state of health until Saturday morning when he had the abrupt onset of substernal CP at ~5 AM. He described this as a substernal pressure. There was no SOB, diaphoresis, nausea or vomitting. He came to the ED where he was noted to have ST elevation. He was taken emergently to the catheterization laboratory where he was found to have a tight diagonal stenosis. He also had spasm in the LAD. There was plaque in the left main and RCA as well.  He did rule in with a troponin of 7. He was loaded with plavix.  He was treated medically initially, but had recurrent angina this AM. Re-look cath was done, including an IVUS of the left main. The left main stenosis was determined to be significant(>60%) by IVUS. He currently is pain free.  Past Medical History  Diagnosis Date  . Hypertension   . Tobacco abuse   . Anginal pain     Past Surgical History  Procedure Laterality Date  . Toe surgery    . Appendectomy    . Knee surgery      fractured patella  . Mouth surgery      Family History  Problem Relation Age of Onset  . CAD Father 9  . Cardiomyopathy Daughter     Social History:  reports that he has been smoking Cigarettes.  He has a 30 pack-year smoking history. He does not have any smokeless tobacco history on file. He reports that he does not drink alcohol or use illicit drugs.  Allergies:  Allergies  Allergen Reactions  . Dairy Aid [Lactase]   . Eggs Or Egg-Derived Products   . Peanut-Containing Drug Products     Does not know which nuts, but states nuts make  him vomit    Medications:  Scheduled: . aspirin  81 mg Oral Daily  . atorvastatin  80 mg Oral q1800  . carvedilol  3.125 mg Oral BID WC  . isosorbide mononitrate  30 mg Oral Daily  . lisinopril  5 mg Oral Daily    Results for orders placed during the hospital encounter of 10/29/13 (from the past 48 hour(s))  TROPONIN I     Status: Abnormal   Collection Time    10/29/13  3:08 PM      Result Value Range   Troponin I 7.07 (*) <0.30 ng/mL   Comment:            Due to the release kinetics of cTnI,     a negative result within the first hours     of the onset of symptoms does not rule out     myocardial infarction with certainty.     If myocardial infarction is still suspected,     repeat the test at appropriate intervals.     REPEATED TO VERIFY     CRITICAL VALUE NOTED.  VALUE IS CONSISTENT WITH PREVIOUSLY REPORTED AND CALLED VALUE.  TROPONIN I     Status: Abnormal   Collection Time    10/29/13  9:09 PM  Result Value Range   Troponin I 6.75 (*) <0.30 ng/mL   Comment:            Due to the release kinetics of cTnI,     a negative result within the first hours     of the onset of symptoms does not rule out     myocardial infarction with certainty.     If myocardial infarction is still suspected,     repeat the test at appropriate intervals.     REPEATED TO VERIFY     CRITICAL VALUE NOTED.  VALUE IS CONSISTENT WITH PREVIOUSLY REPORTED AND CALLED VALUE.  BASIC METABOLIC PANEL     Status: None   Collection Time    10/30/13  4:20 AM      Result Value Range   Sodium 140  135 - 145 mEq/L   Potassium 3.8  3.5 - 5.1 mEq/L   Chloride 105  96 - 112 mEq/L   CO2 22  19 - 32 mEq/L   Glucose, Bld 94  70 - 99 mg/dL   BUN 16  6 - 23 mg/dL   Creatinine, Ser 1.61  0.50 - 1.35 mg/dL   Calcium 9.1  8.4 - 09.6 mg/dL   GFR calc non Af Amer >90  >90 mL/min   GFR calc Af Amer >90  >90 mL/min   Comment: (NOTE)     The eGFR has been calculated using the CKD EPI equation.     This  calculation has not been validated in all clinical situations.     eGFR's persistently <90 mL/min signify possible Chronic Kidney     Disease.  CBC     Status: None   Collection Time    10/30/13  4:20 AM      Result Value Range   WBC 9.2  4.0 - 10.5 K/uL   RBC 5.02  4.22 - 5.81 MIL/uL   Hemoglobin 13.7  13.0 - 17.0 g/dL   HCT 04.5  40.9 - 81.1 %   MCV 82.7  78.0 - 100.0 fL   MCH 27.3  26.0 - 34.0 pg   MCHC 33.0  30.0 - 36.0 g/dL   RDW 91.4  78.2 - 95.6 %   Platelets 269  150 - 400 K/uL  LIPID PANEL     Status: None   Collection Time    10/30/13  4:20 AM      Result Value Range   Cholesterol 152  0 - 200 mg/dL   Triglycerides 76  <213 mg/dL   HDL 43  >08 mg/dL   Total CHOL/HDL Ratio 3.5     VLDL 15  0 - 40 mg/dL   LDL Cholesterol 94  0 - 99 mg/dL   Comment:            Total Cholesterol/HDL:CHD Risk     Coronary Heart Disease Risk Table                         Men   Women      1/2 Average Risk   3.4   3.3      Average Risk       5.0   4.4      2 X Average Risk   9.6   7.1      3 X Average Risk  23.4   11.0                Use the calculated  Patient Ratio     above and the CHD Risk Table     to determine the patient's CHD Risk.                ATP III CLASSIFICATION (LDL):      <100     mg/dL   Optimal      161-096  mg/dL   Near or Above                        Optimal      130-159  mg/dL   Borderline      045-409  mg/dL   High      >811     mg/dL   Very High  BASIC METABOLIC PANEL     Status: Abnormal   Collection Time    10/31/13  8:30 AM      Result Value Range   Sodium 138  135 - 145 mEq/L   Potassium 4.0  3.5 - 5.1 mEq/L   Chloride 103  96 - 112 mEq/L   CO2 23  19 - 32 mEq/L   Glucose, Bld 101 (*) 70 - 99 mg/dL   BUN 12  6 - 23 mg/dL   Creatinine, Ser 9.14  0.50 - 1.35 mg/dL   Calcium 9.4  8.4 - 78.2 mg/dL   GFR calc non Af Amer >90  >90 mL/min   GFR calc Af Amer >90  >90 mL/min   Comment: (NOTE)     The eGFR has been calculated using the CKD EPI equation.      This calculation has not been validated in all clinical situations.     eGFR's persistently <90 mL/min signify possible Chronic Kidney     Disease.  CBC     Status: None   Collection Time    10/31/13  8:30 AM      Result Value Range   WBC 7.6  4.0 - 10.5 K/uL   RBC 5.16  4.22 - 5.81 MIL/uL   Hemoglobin 14.2  13.0 - 17.0 g/dL   HCT 95.6  21.3 - 08.6 %   MCV 82.4  78.0 - 100.0 fL   MCH 27.5  26.0 - 34.0 pg   MCHC 33.4  30.0 - 36.0 g/dL   RDW 57.8  46.9 - 62.9 %   Platelets 297  150 - 400 K/uL    No results found.  Review of Systems  Constitutional: Positive for malaise/fatigue. Negative for fever.  Respiratory: Negative.   Cardiovascular: Positive for chest pain. Negative for palpitations, orthopnea, claudication and leg swelling.  Gastrointestinal: Negative.   Neurological: Negative.   Endo/Heme/Allergies: Negative.   Psychiatric/Behavioral:       Has been under a great deal of stress over past year  All other systems reviewed and are negative.   Blood pressure 120/66, pulse 49, temperature 97.6 F (36.4 C), temperature source Oral, resp. rate 17, height 6\' 2"  (1.88 m), weight 165 lb 9.1 oz (75.1 kg), SpO2 100.00%. Physical Exam  Vitals reviewed. Constitutional: He is oriented to person, place, and time. He appears well-developed and well-nourished. No distress.  HENT:  Head: Normocephalic and atraumatic.  Eyes: EOM are normal. Pupils are equal, round, and reactive to light.  Neck: Neck supple. Erythema (no carotid bruits) present. No thyromegaly present.  Cardiovascular: Normal rate, regular rhythm, normal heart sounds and intact distal pulses.  Exam reveals no gallop and no friction rub.   No murmur heard. Respiratory: Effort  normal and breath sounds normal. He has no wheezes. He has no rales.  GI: Soft. There is no tenderness.  Musculoskeletal: He exhibits no edema.  Lymphadenopathy:    He has no cervical adenopathy.  Neurological: He is alert and oriented to  person, place, and time. No cranial nerve deficit.  No focal deficits  Skin: Skin is warm and dry.    Assessment/Plan: 62 yo male with an unstable coronary syndrome/ STEMI who has significant left main disease demonstrated by IVUS. CABG is indicated for survival benefit and relief of symptoms.  I discussed with the patient the general nature of the procedure, the need for general anesthesia, and the incisions to be used. We discussed the expected hospital stay, overall recovery and short and long term outcomes. He understands the risks include, but are not limited to, death, stroke, MI, DVT/PE, bleeding, possible need for transfusion, infections, cardiac arrhythmias and other organ system dysfunction including respiratory, renal, or GI complications. He accepts the risks and agrees to proceed.  He was loaded with plavix, but given his recurrent CP this AM, I'm reluctant to wait a full 7 days prior to CABG. Will check P2Y12 in AM and hopefully can proceed with CABG later this week.  Eutha Cude C 10/31/2013, 1:24 PM

## 2013-11-04 NOTE — Anesthesia Procedure Notes (Addendum)
Procedure Name: Intubation Date/Time: 11/04/2013 2:00 PM Performed by: Coralee Rud Pre-anesthesia Checklist: Patient identified, Emergency Drugs available, Suction available, Patient being monitored and Timeout performed Patient Re-evaluated:Patient Re-evaluated prior to inductionOxygen Delivery Method: Circle system utilized Preoxygenation: Pre-oxygenation with 100% oxygen Intubation Type: IV induction Ventilation: Mask ventilation without difficulty and Oral airway inserted - appropriate to patient size Laryngoscope Size: Miller and 3 Grade View: Grade I Tube type: Oral Tube size: 8.0 mm Number of attempts: 1 Airway Equipment and Method: Stylet Placement Confirmation: ETT inserted through vocal cords under direct vision,  positive ETCO2 and breath sounds checked- equal and bilateral Secured at: 23 cm Tube secured with: Tape Dental Injury: Teeth and Oropharynx as per pre-operative assessment    PA catheter:  Routine monitors. Timeout, sterile prep, drape, FBP R neck.  Trendelenburg position.  1% Lido local, finder and trocar RIJ 1st pass with US guidance.  Cordis placed over J wire. PA catheter in easily.  Sterile dressing applied.  Patient tolerated well, VSS.  Sandford Craze, MD  [phone]

## 2013-11-04 NOTE — Anesthesia Preprocedure Evaluation (Addendum)
Anesthesia Evaluation  Patient identified by MRN, date of birth, ID band Patient awake    Reviewed: Allergy & Precautions, H&P   History of Anesthesia Complications Negative for: history of anesthetic complications  Airway Mallampati: I      Dental  (+) Teeth Intact   Pulmonary Current Smoker,    Pulmonary exam normal       Cardiovascular hypertension, Pt. on medications + angina at rest and with exertion + CAD (60% L main) and + Past MI Rhythm:Irregular Rate:Bradycardia  11/14 ECHO: EF 40-45%, severe antero-lateral and antero-septal hypokinesis   Neuro/Psych    GI/Hepatic   Endo/Other    Renal/GU      Musculoskeletal   Abdominal Normal abdominal exam  (+)   Peds  Hematology   Anesthesia Other Findings   Reproductive/Obstetrics                         Anesthesia Physical Anesthesia Plan  ASA: IV  Anesthesia Plan: General   Post-op Pain Management:    Induction: Intravenous  Airway Management Planned: Oral ETT  Additional Equipment: Arterial line, CVP and PA Cath  Intra-op Plan:   Post-operative Plan: Post-operative intubation/ventilation  Informed Consent:   Plan Discussed with: CRNA, Anesthesiologist and Surgeon  Anesthesia Plan Comments:         Anesthesia Quick Evaluation

## 2013-11-04 NOTE — OR Nursing (Signed)
1802 first call made to SICU.  1825 second call made to SICU

## 2013-11-04 NOTE — Transfer of Care (Signed)
Immediate Anesthesia Transfer of Care Note  Patient: Abdo Denault Krawczyk  Procedure(s) Performed: Procedure(s): CORONARY ARTERY BYPASS GRAFTING (CABG) TIMES FOUR  USING LEFT INTERNAL MAMMARY ARTERY AND RIGHT AND LEFT SAPHENOUS LEG VEIN HARVESTED ENDOSCOPICALLY (N/A)  Patient Location: PACU  Anesthesia Type:General  Level of Consciousness: Patient remains intubated per anesthesia plan  Airway & Oxygen Therapy: Patient placed on Ventilator (see vital sign flow sheet for setting)  Post-op Assessment: Report given to PACU RN and Post -op Vital signs reviewed and stable  Post vital signs: Reviewed and stable  Complications: No apparent anesthesia complications

## 2013-11-04 NOTE — Brief Op Note (Addendum)
      [address]       [place] [address]             [phone]     10/29/2013 - 11/04/2013  5:17 PM  PATIENT:  Lonnie [name]  62 y.o. male  PRE-OPERATIVE DIAGNOSIS:  CAD  POST-OPERATIVE DIAGNOSIS:  Coronary Artery Disease  PROCEDURE:  Procedure(s): CORONARY ARTERY BYPASS GRAFTING (CABG)X4 LIMA-LAD; SVG-OM; SVG-PD; SVG-DIAG EVH- BILAT THIGH   SURGEON:  Surgeon(s): Loreli Slot, MD  PHYSICIAN ASSISTANT: WAYNE GOLD PA-C  ANESTHESIA:   general  PATIENT CONDITION:  ICU - intubated and hemodynamically stable.  PRE-OPERATIVE WEIGHT: 74kg  COMPLICATIONS: NO KNOWN   Saphenous vein in right leg bifurcated into 2 small branches at the knee. Additional vein harvested from left thigh Conduits and targets good quality.   XC= 75 min CPB= 103 min

## 2013-11-04 NOTE — Interval H&P Note (Signed)
History and Physical Interval Note:  11/04/2013 12:56 PM  Lonnie [name]  has presented today for surgery, with the diagnosis of CAD  The various methods of treatment have been discussed with the patient and family. After consideration of risks, benefits and other options for treatment, the patient has consented to  Procedure(s): CORONARY ARTERY BYPASS GRAFTING (CABG) (N/A) as a surgical intervention .  The patient's history has been reviewed, patient examined, no change in status, stable for surgery.  I have reviewed the patient's chart and labs.  Questions were answered to the patient's satisfaction.     Kenli Waldo C

## 2013-11-04 NOTE — Progress Notes (Signed)
Patient ID: Lonnie [name], male   DOB: 1951/08/05, 62 y.o.   MRN: [id number]   SICU Evening Rounds:   Hemodynamically stable  CI = 2.5  Has not started to wake up on vent. Just back from surgery this evening Urine output good  CT output low  CBC    Component Value Date/Time   WBC 16.8* 11/04/2013 1900   RBC 3.39* 11/04/2013 1900   HGB 8.8* 11/04/2013 1915   HCT 26.0* 11/04/2013 1915   PLT 130* 11/04/2013 1900   MCV 82.0 11/04/2013 1900   MCH 28.0 11/04/2013 1900   MCHC 34.2 11/04/2013 1900   RDW 13.4 11/04/2013 1900   LYMPHSABS 3.9 10/29/2013 0600   MONOABS 0.7 10/29/2013 0600   EOSABS 0.3 10/29/2013 0600   BASOSABS 0.1 10/29/2013 0600     BMET    Component Value Date/Time   NA 140 11/04/2013 1915   K 4.0 11/04/2013 1915   CL 107 11/04/2013 0424   CO2 27 11/04/2013 0424   GLUCOSE 113* 11/04/2013 1915   BUN 15 11/04/2013 0424   CREATININE 0.97 11/04/2013 0424   CALCIUM 9.1 11/04/2013 0424   GFRNONAA 88* 11/04/2013 0424   GFRAA >90 11/04/2013 0424     A/P:  Stable postop course. Continue current plans

## 2013-11-05 ENCOUNTER — Inpatient Hospital Stay (HOSPITAL_COMMUNITY): Payer: 59

## 2013-11-05 LAB — CBC
HCT: 27.7 % — ABNORMAL LOW (ref 39.0–52.0)
HCT: 30.1 % — ABNORMAL LOW (ref 39.0–52.0)
Hemoglobin: 9.3 g/dL — ABNORMAL LOW (ref 13.0–17.0)
Hemoglobin: 10.4 g/dL — ABNORMAL LOW (ref 13.0–17.0)
MCH: 27.6 pg (ref 26.0–34.0)
MCH: 28.4 pg (ref 26.0–34.0)
MCHC: 33.6 g/dL (ref 30.0–36.0)
MCHC: 34.6 g/dL (ref 30.0–36.0)
MCV: 82.2 fL (ref 78.0–100.0)
MCV: 82.2 fL (ref 78.0–100.0)
Platelets: 152 10*3/uL (ref 150–400)
Platelets: 202 10*3/uL (ref 150–400)
RBC: 3.37 MIL/uL — ABNORMAL LOW (ref 4.22–5.81)
RBC: 3.66 MIL/uL — ABNORMAL LOW (ref 4.22–5.81)
RDW: 13.4 % (ref 11.5–15.5)
RDW: 13.5 % (ref 11.5–15.5)
WBC: 11.1 10*3/uL — ABNORMAL HIGH (ref 4.0–10.5)
WBC: 11.5 10*3/uL — ABNORMAL HIGH (ref 4.0–10.5)

## 2013-11-05 LAB — GLUCOSE, CAPILLARY
Glucose-Capillary: 105 mg/dL — ABNORMAL HIGH (ref 70–99)
Glucose-Capillary: 109 mg/dL — ABNORMAL HIGH (ref 70–99)
Glucose-Capillary: 110 mg/dL — ABNORMAL HIGH (ref 70–99)
Glucose-Capillary: 112 mg/dL — ABNORMAL HIGH (ref 70–99)
Glucose-Capillary: 116 mg/dL — ABNORMAL HIGH (ref 70–99)
Glucose-Capillary: 120 mg/dL — ABNORMAL HIGH (ref 70–99)
Glucose-Capillary: 121 mg/dL — ABNORMAL HIGH (ref 70–99)
Glucose-Capillary: 127 mg/dL — ABNORMAL HIGH (ref 70–99)
Glucose-Capillary: 129 mg/dL — ABNORMAL HIGH (ref 70–99)

## 2013-11-05 LAB — POCT I-STAT 3, ART BLOOD GAS (G3+)
Acid-base deficit: 1 mmol/L (ref 0.0–2.0)
Bicarbonate: 24.1 mEq/L — ABNORMAL HIGH (ref 20.0–24.0)
O2 Saturation: 98 %
Patient temperature: 36.5
TCO2: 25 mmol/L (ref 0–100)
pCO2 arterial: 41.6 mmHg (ref 35.0–45.0)
pH, Arterial: 7.368 (ref 7.350–7.450)
pO2, Arterial: 110 mmHg — ABNORMAL HIGH (ref 80.0–100.0)

## 2013-11-05 LAB — BASIC METABOLIC PANEL
BUN: 15 mg/dL (ref 6–23)
CO2: 22 mEq/L (ref 19–32)
Calcium: 8.5 mg/dL (ref 8.4–10.5)
Chloride: 108 mEq/L (ref 96–112)
Creatinine, Ser: 0.95 mg/dL (ref 0.50–1.35)
GFR calc Af Amer: >90 mL/min (ref >90)
GFR calc non Af Amer: 89 mL/min — ABNORMAL LOW (ref >90)
Glucose, Bld: 142 mg/dL — ABNORMAL HIGH (ref 70–99)
Potassium: 4.3 mEq/L (ref 3.5–5.1)
Sodium: 139 mEq/L (ref 135–145)

## 2013-11-05 LAB — POCT I-STAT, CHEM 8
BUN: 13 mg/dL (ref 6–23)
Calcium, Ion: 1.26 mmol/L (ref 1.13–1.30)
Chloride: 101 mEq/L (ref 96–112)
Creatinine, Ser: 1.2 mg/dL (ref 0.50–1.35)
Glucose, Bld: 134 mg/dL — ABNORMAL HIGH (ref 70–99)
HCT: 31 % — ABNORMAL LOW (ref 39.0–52.0)
Hemoglobin: 10.5 g/dL — ABNORMAL LOW (ref 13.0–17.0)
Potassium: 4.3 mEq/L (ref 3.5–5.1)
Sodium: 139 mEq/L (ref 135–145)
TCO2: 26 mmol/L (ref 0–100)

## 2013-11-05 LAB — CREATININE, SERUM
Creatinine, Ser: 0.89 mg/dL (ref 0.50–1.35)
GFR calc Af Amer: >90 mL/min (ref >90)
GFR calc non Af Amer: >90 mL/min (ref >90)

## 2013-11-05 LAB — MAGNESIUM
Magnesium: 3 mg/dL — ABNORMAL HIGH (ref 1.5–2.5)
Magnesium: 2.6 mg/dL — ABNORMAL HIGH (ref 1.5–2.5)

## 2013-11-05 IMAGING — CR DG CHEST 1V PORT
1 series · 1 of 1 positions shown · non-contrast
Comparison: One-view chest [DATE].

CLINICAL DATA: Status post CABG [DATE].

EXAM:
PORTABLE CHEST - 1 VIEW

[AP]
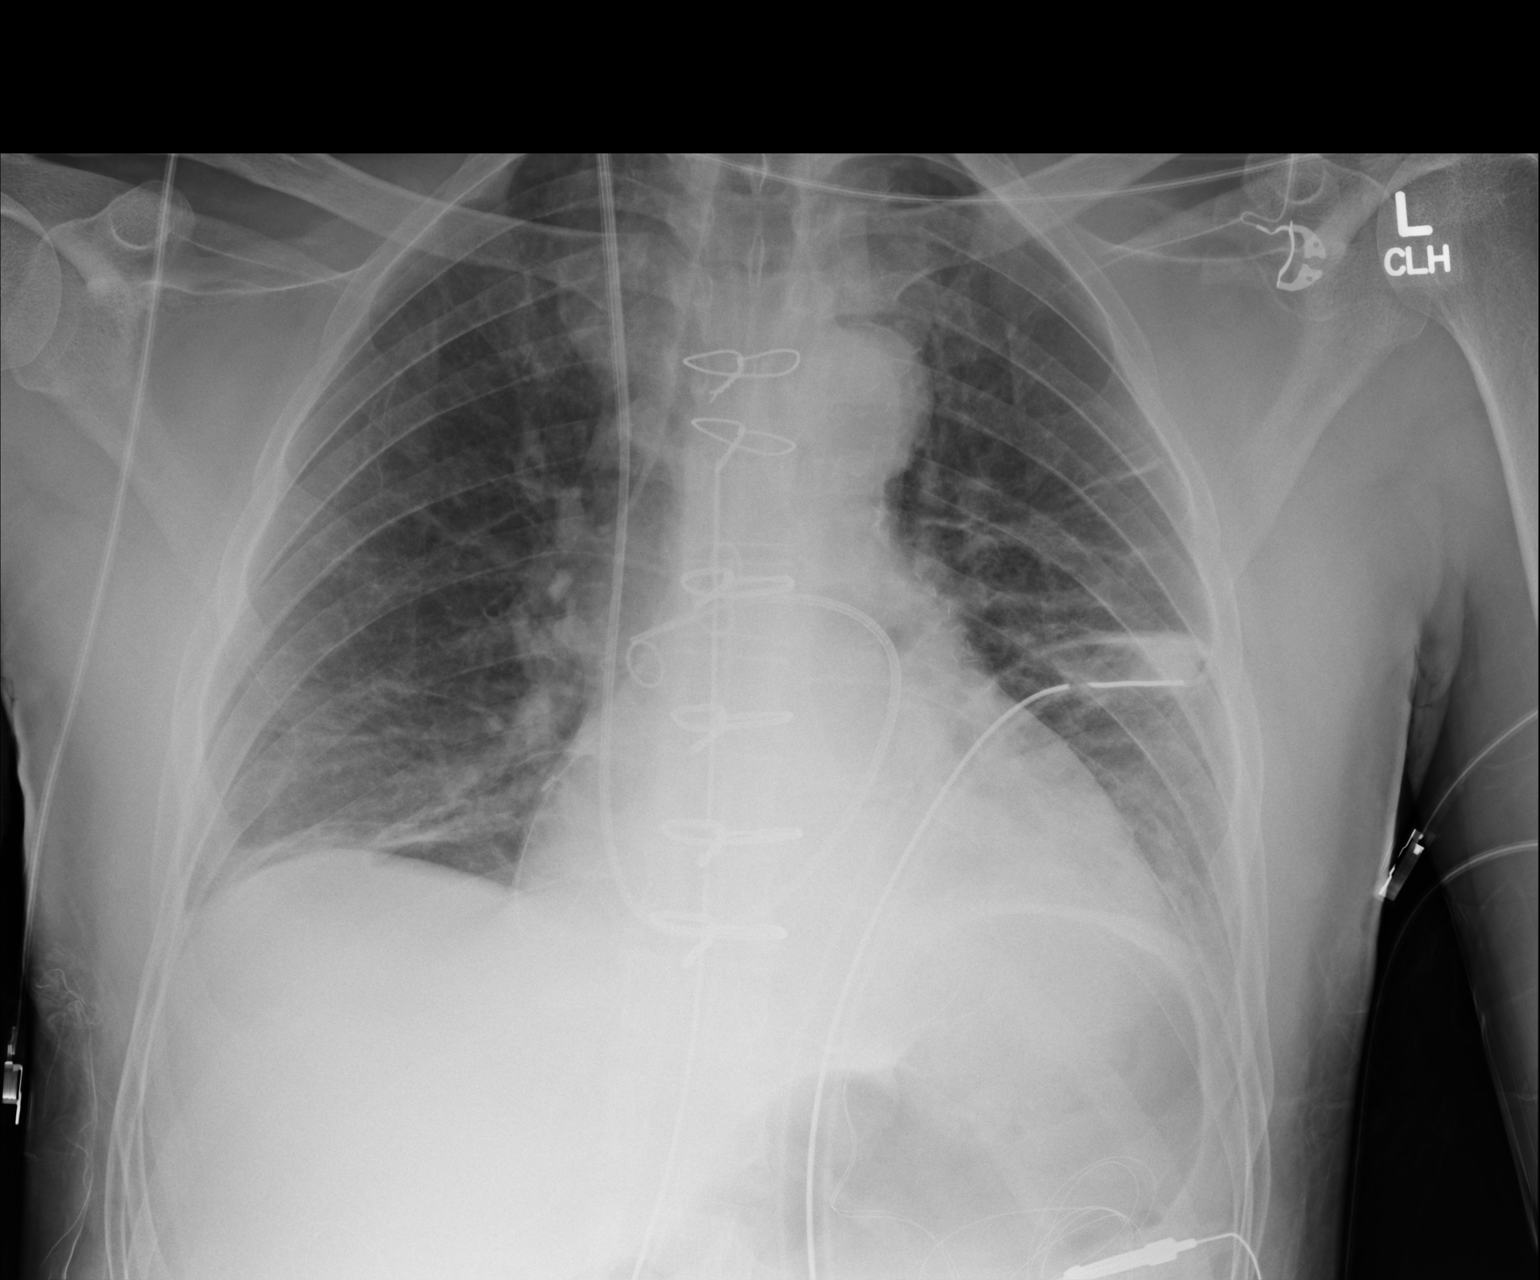

[1 of 1 positions shown; findings below may reference images not displayed]

FINDINGS: The patient has been extubated. The NG tube was removed. The tip of
the Swan-Ganz catheter is in the right main pulmonary artery. A
left-sided chest tube and mediastinal drain are in place without
evidence for pneumothorax. Bibasilar atelectasis has slightly
increased since the prior exam. Minimal pulmonary vascular
congestion is evident.
IMPRESSION: 1. Status post extubation and removal of NG tube.
2. Slight increase and bibasilar atelectasis.
3. The remaining support apparatus are stable.
4. No pneumothorax.

## 2013-11-05 MED ORDER — INSULIN ASPART 100 UNIT/ML ~~LOC~~ SOLN
0.0000 [IU] | SUBCUTANEOUS | Status: DC
  Administered 2013-11-05 – 2013-11-06 (×3): 2 [IU] via SUBCUTANEOUS

## 2013-11-05 MED ORDER — FUROSEMIDE 10 MG/ML IJ SOLN
INTRAMUSCULAR | Status: AC
  Filled 2013-11-05: qty 4

## 2013-11-05 MED ORDER — POTASSIUM CHLORIDE CRYS ER 20 MEQ PO TBCR
40.0000 meq | EXTENDED_RELEASE_TABLET | Freq: Once | ORAL | Status: AC
  Administered 2013-11-05: 40 meq via ORAL
  Filled 2013-11-05: qty 2

## 2013-11-05 MED ORDER — FUROSEMIDE 10 MG/ML IJ SOLN
40.0000 mg | Freq: Once | INTRAMUSCULAR | Status: AC
  Administered 2013-11-05: 40 mg via INTRAVENOUS

## 2013-11-05 NOTE — Progress Notes (Signed)
Patient ID: Lonnie [name], male   DOB: Feb 20, 1951, 62 y.o.   MRN: [id number]   SICU Evening Rounds:   Hemodynamically stable. Sinus 72.  Urine output good   CBC    Component Value Date/Time   WBC 11.5* 11/05/2013 1800   RBC 3.66* 11/05/2013 1800   HGB 10.5* 11/05/2013 1801   HCT 31.0* 11/05/2013 1801   PLT 202 11/05/2013 1800   MCV 82.2 11/05/2013 1800   MCH 28.4 11/05/2013 1800   MCHC 34.6 11/05/2013 1800   RDW 13.5 11/05/2013 1800   LYMPHSABS 3.9 10/29/2013 0600   MONOABS 0.7 10/29/2013 0600   EOSABS 0.3 10/29/2013 0600   BASOSABS 0.1 10/29/2013 0600     BMET    Component Value Date/Time   NA 139 11/05/2013 1801   K 4.3 11/05/2013 1801   CL 101 11/05/2013 1801   CO2 22 11/05/2013 0353   GLUCOSE 134* 11/05/2013 1801   BUN 13 11/05/2013 1801   CREATININE 1.20 11/05/2013 1801   CALCIUM 8.5 11/05/2013 0353   GFRNONAA >90 11/05/2013 1800   GFRAA >90 11/05/2013 1800     A/P:  Stable postop course. Continue current plans

## 2013-11-05 NOTE — Procedures (Signed)
Extubation Procedure Note  Patient Details:   Name: Lonnie [name] DOB: 1951/09/04 MRN: [id number]   Airway Documentation:   Pt extubated following Rapid Wean Protocol. Pre extubation: NIF -24, VC 950, audible cuff leak. Post extubation: placed on 2L Wallace Sats 98%. Pt able to verbalize name/location. Clear BBS. No stridor RT will continue to monitor.  Evaluation  O2 sats: stable throughout Complications: No apparent complications Patient did tolerate procedure well. Bilateral Breath Sounds: Clear   Yes  Elmer Picker 11/05/2013, 2:38 AM

## 2013-11-05 NOTE — Op Note (Signed)
NAMEMarland Kitchen  ASHDEN, SONNENBERG NO.:  [id number]  MEDICAL RECORD NO.:  [id number]  LOCATION:  2S04C                        FACILITY:  [facility]  PHYSICIAN:  Salvatore Decent. 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  PROCEDURE:  Median sternotomy, extracorporeal circulation, coronary artery bypass grafting x4 (left internal mammary artery to left anterior descending, saphenous vein graft to first diagonal, saphenous vein graft to first obtuse marginal, saphenous vein graft to posterior descending).  SURGEON:  Salvatore Decent. Dorris Fetch, MD  ASSISTANT:  Rowe Clack, PA-C  ANESTHESIA:  General.  FINDINGS:  Distal end of mammary small. Proximal mammary with some spasm but improved with papaverine.  The probe did pass retrograde into the subclavian.  Good quality targets.  Right saphenous vein bifurcated into 2 small branches at the level of the knee.  An additional vein harvested from left thigh.  All vein that was utilized was good quality.  CLINICAL NOTE:  Mr. Hires is a 62 year old gentleman who presented with an ST elevation MI.  He was loaded with Plavix and underwent cardiac catheterization.  He was found to have what was initially thought to be moderate left main disease and a tight diagonal lesion.  He was initially treated medically but had recurrent chest pain.  Relook catheterization including intravascular ultrasound of the left main revealed that the lesion was hemodynamically significant.  He was advised to undergo coronary artery bypass grafting after allowing time for Plavix washout.  The patient was felt ready to proceed with coronary artery bypass grafting at this time.   He accepted the risks of surgery and agreed to proceed.  OPERATIVE NOTE:  Mr. Quinney was brought to the preoperative holding area on November 04, 2013.  There the Anesthesia Service placed a Swan- Ganz catheter and arterial blood pressure monitoring line.  He was taken to the operating room, anesthetized, and intubated.  A Foley catheter was placed.  Intravenous antibiotics were administered.  The chest, abdomen, and legs were prepped and draped in usual sterile fashion.  An incision was made in the medial aspect of the right leg at the level of the knee.  The greater saphenous vein was harvested from the right Thigh. At the level of the knee, the vein bifurcated and turned out to be too small to use below the knee.  Therefore, an additional segment of vein was harvested endoscopically from the left lower thigh.  All of the vein that was utilized was of good quality.  Simultaneously, with the vein harvest, a median sternotomy was performed and the left internal mammary artery was harvested using standard technique.  Heparin, 2000 units, was administered during the vessel harvest.  The mammary bifurcated relatively high and did have some spasm noted during the takedown. After taking down the artery, the distal end was inspected and was really too small to utilize as a bypass graft, however, more proximally the vessel was acceptable caliber, although there was some spasm.  A 1.5 mm probe did pass easily and there was then good flow through the graft.  After harvesting the conduits, the remainder of full heparin dose was given.  The pericardium was opened.  The ascending aorta was of normal caliber.  It was palpated and there was no palpable atherosclerotic disease.  After confirming adequate anticoagulation with ACT measurement, the aorta was cannulated via concentric 2-0 Ethibond pledgeted pursestring sutures.  A dual-stage venous cannula was placed via pursestring suture in the right  atrial appendage.  Cardiopulmonary bypass was instituted and the patient was cooled to 32 degrees Celsius. The coronary arteries were inspected and anastomotic sites were chosen. The conduits were inspected and cut to length.  A foam pad was placed in the pericardium to insulate the heart and protect left phrenic nerve.  A temperature probe was placed in the myocardial septum and a cardioplegia cannula was placed in the ascending aorta.  The aorta was crossclamped.  The left ventricle was emptied via the aortic root vent.  Cardiac arrest then was achieved with combination of cold antegrade blood cardioplegia and topical iced saline.  1 L of cardioplegia was administered.  There was a rapid diastolic arrest with myocardial septal cooling to 10 degrees Celsius.  The following distal anastomoses were then performed.  First, a reversed saphenous vein graft was placed end-to-side to the posterior descending branch of the right coronary.  The vein was of good quality.  It was anastomosed end-to-side to the posterior descending which was 1.5 mm good quality target.  At the completion of each anastomosis, a probe was passed proximally and distally to ensure patency.  Cardioplegia was administered at the completion of each vein graft to assess flow and hemostasis and both were good.  Next, a reversed saphenous vein graft was placed end-to-side to the first diagonal branch to the LAD.  This vessel had a tight proximal stenosis. It was a 1.5 mm good quality target at the site of anastomosis.  The vein was anastomosed end-to-side with a running 7-0 Prolene suture.  Next, a reversed saphenous vein graft was placed end-to-side to obtuse marginal 1.  This was a high anterolateral branch.  It was a good quality target 1.5 mm in diameter.  The vein was anastomosed end-to-side with a running 7-0 Prolene suture, again there was good flow and good hemostasis with cardioplegia  administration.  Additional cardioplegia was administered down the aortic root.  Next, the left internal mammary artery was brought through a window in the pericardium.  The distal end was beveled and was anastomosed end-to-side to the LAD.  The LAD was a 2 mm good quality target.  The mammary was a 1.5 mm good quality conduit.  An end-to-side anastomosis was performed with a running 8-0 Prolene suture.  At the completion of the mammary to LAD anastomosis, the bulldog clamps were briefly removed to inspect for hemostasis.  Immediate and rapid septal rewarming was noted.  The bulldog clamp was replaced and the mammary pedicle was tacked to the epicardial surface of the heart with 6-0 Prolene sutures.  Additional cardioplegia was administered.  The vein grafts were cut to length.  The proximal vein graft anastomoses were performed to 4.5 mm punch aortotomies with running 6-0 Prolene sutures.  At the completion of the final proximal vein graft anastomosis, the patient was placed in Trendelenburg position.  Lidocaine was administered.  The aortic root was de-aired.  The aortic crossclamp was removed.  Total crossclamp time was 75 minutes.  The patient required a single defibrillation with 10 joules and then was in sinus bradycardia thereafter.  While rewarming was completed, all proximal and distal anastomoses were inspected for hemostasis.  Epicardial pacing wires were placed on the right ventricle and right atrium.  When the patient had rewarmed to a core temperature of 37 degrees Celsius, he was weaned from cardiopulmonary bypass on first attempt, the total bypass time was 103 minutes.  The initial cardiac index was greater than 2 L/min/m2.  The patient remained hemodynamically stable throughout the postbypass period.  A test dose of protamine was administered and was well tolerated.  The atrial and aortic cannulae were removed.  The remainder of the protamine was administered  without incident.  The chest was irrigated with warm saline.  Hemostasis was achieved.  The pericardium was reapproximated with interrupted 3-0 silk sutures.  It came together easily without tension and without causing any hemodynamic change.  Left pleural and mediastinal chest tubes were placed through separate subcostal incisions.  The sternum was closed with a combination of single and double heavy gauge stainless steel wires.  The pectoralis fascia, subcutaneous tissue, and skin were closed in standard fashion.  All sponge, needle, and instrument counts were correct at the end of the procedure.  There were no intraoperative complications.  The patient was taken from the operating room to the surgical intensive care unit in good condition.     Salvatore Decent Dorris Fetch, M.D.     SCH/MEDQ  D:  11/04/2013  T:  11/05/2013  Job:  [id number]

## 2013-11-05 NOTE — Progress Notes (Signed)
1 Day Post-Op Procedure(s) (LRB): CORONARY ARTERY BYPASS GRAFTING (CABG) TIMES FOUR  USING LEFT INTERNAL MAMMARY ARTERY AND RIGHT AND LEFT SAPHENOUS LEG VEIN HARVESTED ENDOSCOPICALLY (N/A) Subjective: No complaints  Objective: Vital signs in last 24 hours: Temp:  [95.2 F (35.1 C)-98.4 F (36.9 C)] 98.1 F (36.7 C) (11/15 1000) Pulse Rate:  [41-91] 90 (11/15 1100) Cardiac Rhythm:  [-] Atrial paced (11/15 0800) Resp:  [0-33] 13 (11/15 1100) BP: (76-118)/(52-73) 98/66 mmHg (11/15 1100) SpO2:  [96 %-100 %] 100 % (11/15 1100) Arterial Line BP: (78-135)/(49-76) 121/55 mmHg (11/15 1100) FiO2 (%):  [40 %-50 %] 40 % (11/15 0125) Weight:  [80.2 kg (176 lb 12.9 oz)] 80.2 kg (176 lb 12.9 oz) (11/15 0515)  Hemodynamic parameters for last 24 hours: PAP: (23-48)/(8-25) 26/8 mmHg CO:  [3.8 L/min-7.1 L/min] 5.3 L/min CI:  [2.2 L/min/m2-4 L/min/m2] 2.8 L/min/m2  Intake/Output from previous day: 11/14 0701 - 11/15 0700 In: 4900.1 [P.O.:120; I.V.:2950.1; NG/GT:60; IV Piggyback:1770] Out: 4755 [Urine:2775; Emesis/NG output:50; Blood:1310; Chest Tube:620] Intake/Output this shift: Total I/O In: 410 [P.O.:240; I.V.:120; IV Piggyback:50] Out: 440 [Urine:300; Chest Tube:140]  General appearance: alert and cooperative Neurologic: intact Heart: regular rate and rhythm, S1, S2 normal, no murmur, click, rub or gallop Lungs: clear to auscultation bilaterally Extremities: extremities normal, atraumatic, no cyanosis or edema Wound: dressing dry  Lab Results:  Recent Labs  11/04/13 1900 11/04/13 1915 11/05/13 0353  WBC 16.8*  --  11.1*  HGB 9.5* 8.8* 9.3*  HCT 27.8* 26.0* 27.7*  PLT 130*  --  152   BMET:  Recent Labs  11/04/13 0424  11/04/13 1915 11/05/13 0353  NA 143  < > 140 139  K 4.9  < > 4.0 4.3  CL 107  --   --  108  CO2 27  --   --  22  GLUCOSE 93  < > 113* 142*  BUN 15  --   --  15  CREATININE 0.97  --   --  0.95  CALCIUM 9.1  --   --  8.5  < > = values in this interval not  displayed.  PT/INR:  Recent Labs  11/04/13 1900  LABPROT 16.0*  INR 1.31   ABG    Component Value Date/Time   PHART 7.366 11/04/2013 1909   HCO3 23.1 11/04/2013 1909   TCO2 24 11/04/2013 1909   ACIDBASEDEF 3.0* 11/04/2013 1909   O2SAT 94.0 11/04/2013 1909   CBG (last 3)   Recent Labs  11/04/13 2344 11/05/13 0353 11/05/13 0725  GLUCAP 121* 127* 116*   CXR: clear  ECG: Sinus, no acute changes  Assessment/Plan: S/P Procedure(s) (LRB): CORONARY ARTERY BYPASS GRAFTING (CABG) TIMES FOUR  USING LEFT INTERNAL MAMMARY ARTERY AND RIGHT AND LEFT SAPHENOUS LEG VEIN HARVESTED ENDOSCOPICALLY (N/A) Sinus brady in the 50's. Will hold off on starting Beta Blocker. Chest tube output still 50 cc/hr and bloody intermiittently this am. Will keep tubes in for now. Mobilize Diuresis Diabetes control: Hgb A1c is 5.9. Continue SSI Continue foley due to diuresing patient and patient in ICU See progression orders Expected acute blood loss anemia: observe   LOS: 7 days    Lonnie [name] K 11/05/2013

## 2013-11-06 ENCOUNTER — Inpatient Hospital Stay (HOSPITAL_COMMUNITY): Payer: 59

## 2013-11-06 LAB — BASIC METABOLIC PANEL
BUN: 14 mg/dL (ref 6–23)
CO2: 26 mEq/L (ref 19–32)
Calcium: 8.7 mg/dL (ref 8.4–10.5)
Chloride: 102 mEq/L (ref 96–112)
Creatinine, Ser: 0.88 mg/dL (ref 0.50–1.35)
GFR calc Af Amer: >90 mL/min (ref >90)
GFR calc non Af Amer: >90 mL/min (ref >90)
Glucose, Bld: 143 mg/dL — ABNORMAL HIGH (ref 70–99)
Potassium: 4 mEq/L (ref 3.5–5.1)
Sodium: 134 mEq/L — ABNORMAL LOW (ref 135–145)

## 2013-11-06 LAB — GLUCOSE, CAPILLARY
Glucose-Capillary: 117 mg/dL — ABNORMAL HIGH (ref 70–99)
Glucose-Capillary: 148 mg/dL — ABNORMAL HIGH (ref 70–99)
Glucose-Capillary: 84 mg/dL (ref 70–99)
Glucose-Capillary: 89 mg/dL (ref 70–99)
Glucose-Capillary: 113 mg/dL — ABNORMAL HIGH (ref 70–99)

## 2013-11-06 LAB — CBC
HCT: 28.9 % — ABNORMAL LOW (ref 39.0–52.0)
Hemoglobin: 9.6 g/dL — ABNORMAL LOW (ref 13.0–17.0)
MCH: 27.9 pg (ref 26.0–34.0)
MCHC: 33.2 g/dL (ref 30.0–36.0)
MCV: 84 fL (ref 78.0–100.0)
Platelets: 195 10*3/uL (ref 150–400)
RBC: 3.44 MIL/uL — ABNORMAL LOW (ref 4.22–5.81)
RDW: 13.7 % (ref 11.5–15.5)
WBC: 12.7 10*3/uL — ABNORMAL HIGH (ref 4.0–10.5)

## 2013-11-06 IMAGING — CR DG CHEST 1V PORT
1 series · 1 of 1 positions shown · non-contrast
Comparison: [DATE]

CLINICAL DATA: Postop cardiac surgery.

EXAM:
PORTABLE CHEST - 1 VIEW

[AP]
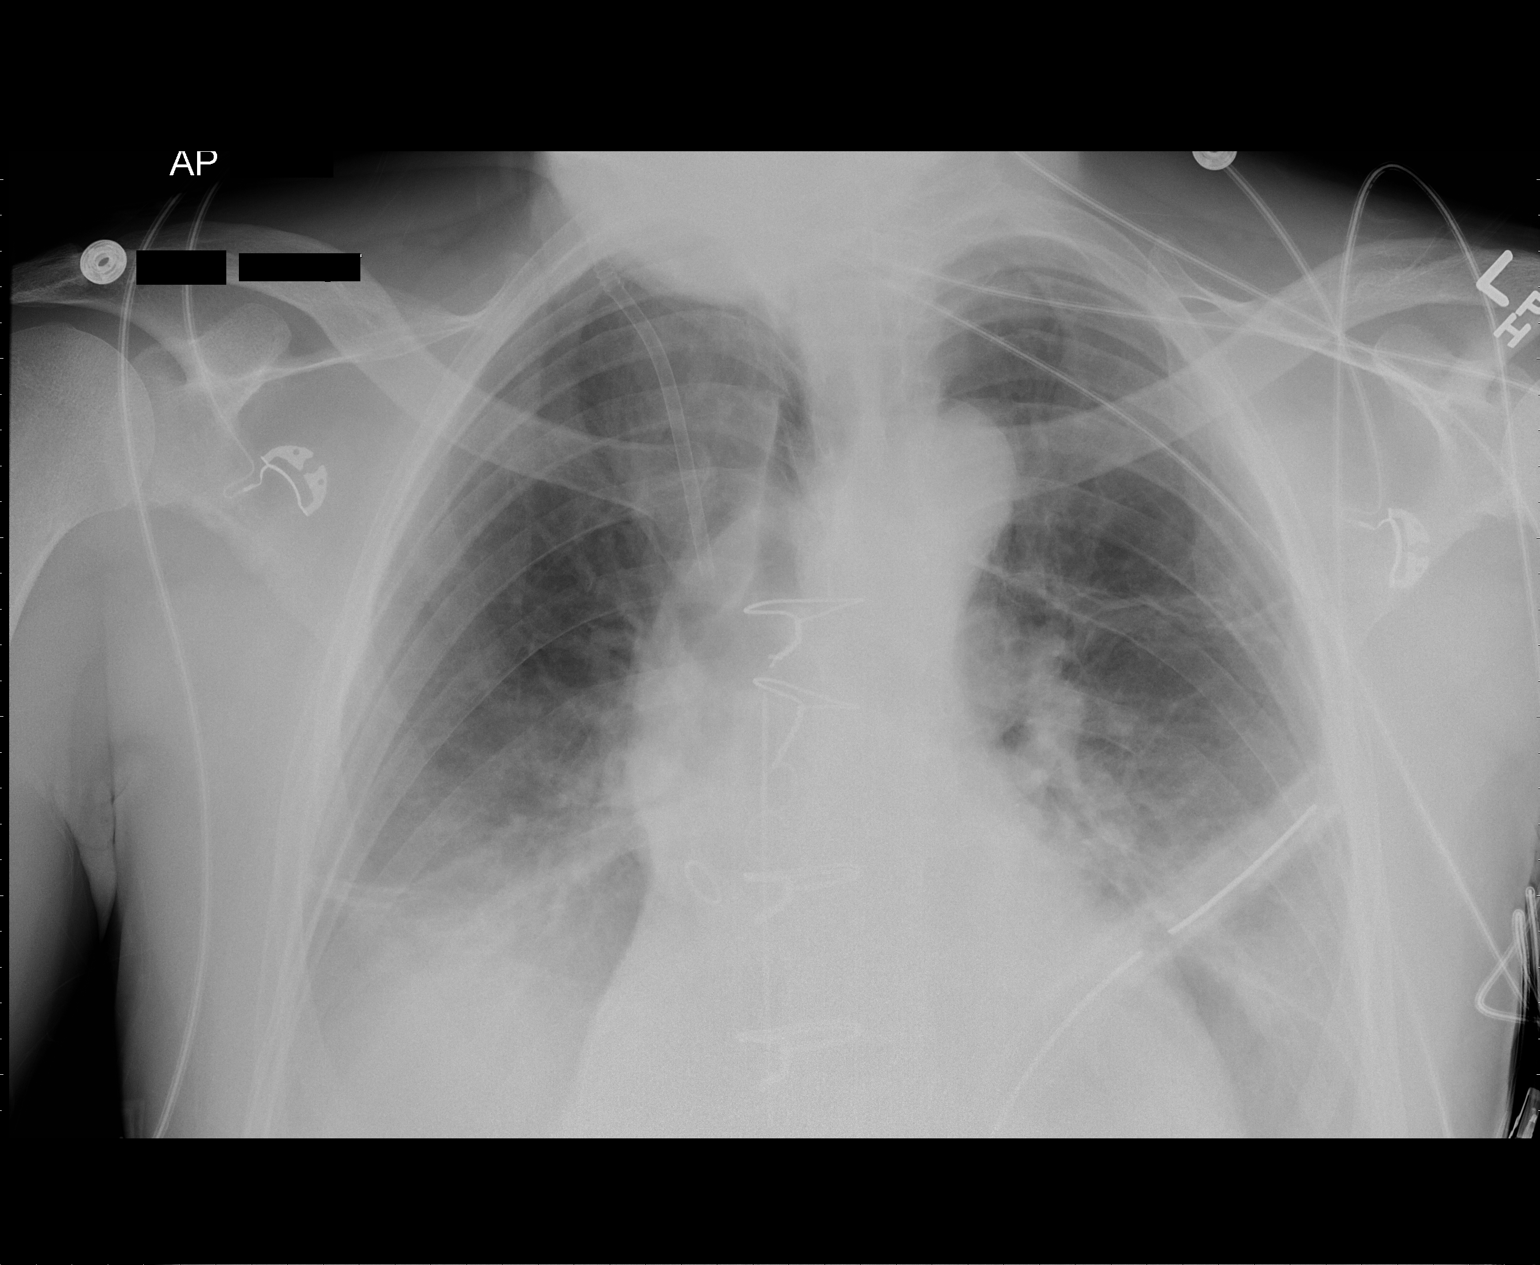

[1 of 1 positions shown; findings below may reference images not displayed]

FINDINGS: Since the prior exam, the Swan-Ganz catheter has been removed
leaving the right internal jugular Cordis in place. Mediastinal tube
and left chest tube also remain in place.

No pneumothorax. Lung base opacity appears increased from the prior
exam, but this is likely due to the semi-erect positioning. The
opacity is likely combination of small effusions and atelectasis.
Mild atelectasis extends laterally from the left hila, stable. No
pulmonary edema.

No mediastinal widening.
IMPRESSION: 1. Small bilateral effusions with dependent atelectasis. Mild left
mid lung zone atelectasis. Allowing for patient positioning
differences, this is felt to be stable. No pulmonary edema. No
pneumothorax. No mediastinal widening.

## 2013-11-06 MED ORDER — ACETAMINOPHEN 325 MG PO TABS: 650.0000 mg | ORAL_TABLET | Freq: Four times a day (QID) | ORAL | Status: DC | PRN

## 2013-11-06 MED ORDER — BISACODYL 10 MG RE SUPP: 10.0000 mg | Freq: Every day | RECTAL | Status: DC | PRN

## 2013-11-06 MED ORDER — ONDANSETRON HCL 4 MG PO TABS: 4.0000 mg | ORAL_TABLET | Freq: Four times a day (QID) | ORAL | Status: DC | PRN

## 2013-11-06 MED ORDER — SODIUM CHLORIDE 0.9 % IJ SOLN
3.0000 mL | Freq: Two times a day (BID) | INTRAMUSCULAR | Status: DC
  Administered 2013-11-06 – 2013-11-08 (×4): 3 mL via INTRAVENOUS

## 2013-11-06 MED ORDER — POTASSIUM CHLORIDE CRYS ER 20 MEQ PO TBCR
40.0000 meq | EXTENDED_RELEASE_TABLET | Freq: Every day | ORAL | Status: AC
  Administered 2013-11-07 – 2013-11-08 (×2): 40 meq via ORAL
  Filled 2013-11-06 (×2): qty 2

## 2013-11-06 MED ORDER — DOCUSATE SODIUM 100 MG PO CAPS
200.0000 mg | ORAL_CAPSULE | Freq: Every day | ORAL | Status: DC
  Administered 2013-11-07 – 2013-11-08 (×2): 200 mg via ORAL
  Filled 2013-11-06 (×2): qty 2

## 2013-11-06 MED ORDER — BISACODYL 5 MG PO TBEC: 10.0000 mg | DELAYED_RELEASE_TABLET | Freq: Every day | ORAL | Status: DC | PRN

## 2013-11-06 MED ORDER — SODIUM CHLORIDE 0.9 % IJ SOLN: 3.0000 mL | INTRAMUSCULAR | Status: DC | PRN

## 2013-11-06 MED ORDER — MOVING RIGHT ALONG BOOK
Freq: Once | Status: DC
  Filled 2013-11-06: qty 1

## 2013-11-06 MED ORDER — FAMOTIDINE 20 MG PO TABS
20.0000 mg | ORAL_TABLET | Freq: Two times a day (BID) | ORAL | Status: DC
  Administered 2013-11-06 – 2013-11-08 (×4): 20 mg via ORAL
  Filled 2013-11-06 (×5): qty 1

## 2013-11-06 MED ORDER — SODIUM CHLORIDE 0.9 % IV SOLN: 250.0000 mL | INTRAVENOUS | Status: DC | PRN

## 2013-11-06 MED ORDER — OXYCODONE HCL 5 MG PO TABS
5.0000 mg | ORAL_TABLET | ORAL | Status: DC | PRN
  Administered 2013-11-07 – 2013-11-08 (×8): 10 mg via ORAL
  Filled 2013-11-06 (×8): qty 2

## 2013-11-06 MED ORDER — ASPIRIN EC 325 MG PO TBEC
325.0000 mg | DELAYED_RELEASE_TABLET | Freq: Every day | ORAL | Status: DC
  Administered 2013-11-07 – 2013-11-08 (×2): 325 mg via ORAL
  Filled 2013-11-06 (×3): qty 1

## 2013-11-06 MED ORDER — TRAMADOL HCL 50 MG PO TABS: 50.0000 mg | ORAL_TABLET | ORAL | Status: DC | PRN

## 2013-11-06 MED ORDER — ONDANSETRON HCL 4 MG/2ML IJ SOLN: 4.0000 mg | Freq: Four times a day (QID) | INTRAMUSCULAR | Status: DC | PRN

## 2013-11-06 MED ORDER — FUROSEMIDE 40 MG PO TABS
40.0000 mg | ORAL_TABLET | Freq: Every day | ORAL | Status: DC
  Administered 2013-11-07: 40 mg via ORAL
  Filled 2013-11-06 (×3): qty 1

## 2013-11-06 MED ORDER — METOPROLOL TARTRATE 12.5 MG HALF TABLET
12.5000 mg | ORAL_TABLET | Freq: Two times a day (BID) | ORAL | Status: DC
  Administered 2013-11-06 – 2013-11-08 (×4): 12.5 mg via ORAL
  Filled 2013-11-06 (×5): qty 1

## 2013-11-06 NOTE — Plan of Care (Signed)
Problem: Phase III Progression Outcomes Goal: Time patient transferred to PCTU/Telemetry POD 11/06/13 1700

## 2013-11-06 NOTE — Anesthesia Postprocedure Evaluation (Signed)
  Anesthesia Post-op Note  Patient: Satoshi Kalas Crable  Procedure(s) Performed: Procedure(s): CORONARY ARTERY BYPASS GRAFTING (CABG) TIMES FOUR  USING LEFT INTERNAL MAMMARY ARTERY AND RIGHT AND LEFT SAPHENOUS LEG VEIN HARVESTED ENDOSCOPICALLY (N/A)  Patient Location: SICU  Anesthesia Type:General  Level of Consciousness: Patient remains intubated per anesthesia plan  Airway and Oxygen Therapy: Patient remains intubated per anesthesia plan and Patient placed on Ventilator (see vital sign flow sheet for setting)  Post-op Pain: none  Post-op Assessment: Post-op Vital signs reviewed  Post-op Vital Signs: stable  Complications: No apparent anesthesia complications

## 2013-11-06 NOTE — Progress Notes (Signed)
Pt arrived on floor. Assisted to bed. Telemetry connected. Vital signs stable. Family at bedside. Call bell within reach. Pt denies any pain at this time. Will continue to monitor.

## 2013-11-06 NOTE — Progress Notes (Signed)
Report received from [place], RN on 2S. Awaiting patient's arrival.

## 2013-11-07 ENCOUNTER — Encounter (HOSPITAL_COMMUNITY): Payer: Self-pay | Admitting: Thoracic Surgery (Cardiothoracic Vascular Surgery)

## 2013-11-07 DIAGNOSIS — Z951 Presence of aortocoronary bypass graft: Secondary | ICD-10-CM

## 2013-11-07 MED ORDER — LISINOPRIL 5 MG PO TABS
5.0000 mg | ORAL_TABLET | Freq: Every day | ORAL | Status: DC
  Administered 2013-11-07 – 2013-11-08 (×2): 5 mg via ORAL
  Filled 2013-11-07 (×2): qty 1

## 2013-11-07 NOTE — Progress Notes (Addendum)
      [address]       [place] [address]             [phone]      3 Days Post-Op Procedure(s) (LRB): CORONARY ARTERY BYPASS GRAFTING (CABG) TIMES FOUR  USING LEFT INTERNAL MAMMARY ARTERY AND RIGHT AND LEFT SAPHENOUS LEG VEIN HARVESTED ENDOSCOPICALLY (N/A)  Subjective:  Lonnie [name] states he is doing okay.  He complains he has not been able to get much sleep.  He is ambulating independently. + BM  Objective: Vital signs in last 24 hours: Temp:  [98.6 F (37 C)-99.5 F (37.5 C)] 98.6 F (37 C) (11/17 0517) Pulse Rate:  [74-88] 79 (11/17 0517) Cardiac Rhythm:  [-] Normal sinus rhythm (11/17 0740) Resp:  [13-22] 20 (11/17 0517) BP: (109-134)/(61-80) 118/64 mmHg (11/17 0517) SpO2:  [90 %-100 %] 97 % (11/17 0517) Weight:  [174 lb 13.2 oz (79.3 kg)] 174 lb 13.2 oz (79.3 kg) (11/17 0517)  Intake/Output from previous day: 11/16 0701 - 11/17 0700 In: 560 [P.O.:480; I.V.:80] Out: 1715 [Urine:1675; Chest Tube:40]  General appearance: alert, cooperative and no distress Heart: regular rate and rhythm Lungs: diminished breath sounds bibasilar Abdomen: soft, non-tender; bowel sounds normal; no masses,  no organomegaly Extremities: edema trace Wound: clean and dry  Lab Results:  Recent Labs  11/05/13 1800 11/05/13 1801 11/06/13 0438  WBC 11.5*  --  12.7*  HGB 10.4* 10.5* 9.6*  HCT 30.1* 31.0* 28.9*  PLT 202  --  195   BMET:  Recent Labs  11/05/13 0353  11/05/13 1801 11/06/13 0438  NA 139  --  139 134*  K 4.3  --  4.3 4.0  CL 108  --  101 102  CO2 22  --   --  26  GLUCOSE 142*  --  134* 143*  BUN 15  --  13 14  CREATININE 0.95  < > 1.20 0.88  CALCIUM 8.5  --   --  8.7  < > = values in this interval not displayed.  PT/INR:  Recent Labs  11/04/13 1900  LABPROT 16.0*  INR 1.31   ABG    Component Value Date/Time   PHART 7.368 11/05/2013 0144   HCO3 24.1* 11/05/2013 0144   TCO2 26 11/05/2013 1801   ACIDBASEDEF 1.0 11/05/2013 0144   O2SAT 98.0 11/05/2013 0144   CBG (last 3)   Recent Labs  11/06/13 0746 11/06/13 1212 11/06/13 1631  GLUCAP 84 89 113*    Assessment/Plan: S/P Procedure(s) (LRB): CORONARY ARTERY BYPASS GRAFTING (CABG) TIMES FOUR  USING LEFT INTERNAL MAMMARY ARTERY AND RIGHT AND LEFT SAPHENOUS LEG VEIN HARVESTED ENDOSCOPICALLY (N/A)  1. CV- NSR good rate and pressure control- continue Lopressor, will start low dose ACE for S/P STEMI 2. Pulm- good use of IS, off oxygen no complaints 3. Renal- creatinine has been WNL, mildly volume overloaded continue diuresis 4. Expected Acute Blood Loss Anemia- Hgb stable at 9.6 yesterday 5. Dispo- patient looks great, will d/c EPW, start ACE, likely d/c in AM   LOS: 9 days    Lonnie [name] 11/07/2013  Looks great. Hopefully home in AM

## 2013-11-07 NOTE — Discharge Summary (Signed)
Physician Discharge Summary  Patient ID: Lonnie [name] MRN: [id number] DOB/AGE: Nov 14, 1951 62 y.o.  Admit date: 10/29/2013 Discharge date: 11/08/2013  Admission Diagnoses:  Patient Active Problem List   Diagnosis Date Noted  . HTN (hypertension) 10/29/2013  . Tobacco abuse 10/29/2013  . ST elevation myocardial infarction (STEMI) of anterior wall 10/29/2013  . Coronary atherosclerosis of native coronary artery 10/29/2013   Discharge Diagnoses:   Patient Active Problem List   Diagnosis Date Noted  . S/P CABG x 4 11/07/2013  . HTN (hypertension) 10/29/2013  . Tobacco abuse 10/29/2013  . ST elevation myocardial infarction (STEMI) of anterior wall 10/29/2013  . Coronary atherosclerosis of native coronary artery 10/29/2013   Discharged Condition: good  History of Present Illness:   [name] is a 62 yo gentleman with no prior cardiac history. He does have a history of hypertension and smokes about 1/2 ppd. 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 He was in his usual state of health until Saturday morning when he developed abrupt onset of substernal CP around 5 AM. He described this as a substernal pressure. There was no SOB, diaphoresis, nausea or vomiting. He came to the ED where he was noted to have ST elevation.   Hospital Course:   He was taken emergently to the catheterization laboratory where he was found to have a tight diagonal stenosis. He also had spasm in the LAD. There was plaque in the left main and RCA as well. He was loaded with plavix and placed on IV NTG and admitted for further observation.  The patient continued to have steady chest pain with continued ST segment changes.  He was taken for repeat catheterization at which time IVUS study of the left main was performed and was indicative a critical stenosis.  The patient's Plavix was discontinued and CT surgery consult was requested for possible Coronary Bypass procedure.  He was evaluated by Dr.  Dorris Fetch at which time he was chest pain free.  However, Dr. Dorris Fetch was in agreement patient would benefit from Coronary revascularization.  The risks and benefits of the procedure were explained to the patient and he was agreeable to proceed.  Since the patient had received Plavix it was felt a P2Y12 level was checked prior to proceeding which did show severe platelet inhibition.  Therefore, the patient was placed on Lovenox and would wait a few days to allow Plavix to clear from system prior to proceeding.  He remained chest pain free and was taken to the operating room on 11/04/2013.  He underwent CABG x 4 utilizing LIMA to LAD, SVG to OM, SVG to PD, and SVG to Diagonal.  He also underwent Endoscopic saphenous vein harvest of right and left thigh.  He tolerated the procedure well and was taken to the SICU in stable condition.  During his stay in the ICU the patient was extubated.  He was weaned off drips as tolerated.  His chest tubes and pacing wires were removed without difficulty.  Once medically stable the patient was transferred to the telemetry unit in stable condition.  The patient continues to progress.  He is maintaining NSR and his pacing wires have been removed.  He is ambulating independently and tolerating a carb modified diet.  Should no further issues arise we anticipate discharge home in the next 24-48 hours.  He will follow up with Dr. Dorris Fetch in 3 weeks with a CXR prior to his appointment.  He will also need to follow up with  Dr. Clifton James in 2-4 weeks.       Consults: cardiology  Significant Diagnostic Studies: angiography:   Hemodynamic Findings:  Central aortic pressure: 117/64  Left ventricular pressure: 124/3/4  Angiographic Findings:  Left main: 50-60% distal stenosis.  Left Anterior Descending Artery: Large caliber vessel that courses to the apex. The proximal vessel has diffuse 40% stenosis. The mid vessel has diffuse 40% stenosis. The distal vessel has mild plaque  disease. The first diagonal branch is small in caliber with 40% mid stenosis. The second diagonal branch is moderate in caliber with proximal aneurysmal segment followed by 80-90% stenosis. There is good flow down the second Diagonal branch. Of note, there was intense vasospasm of the proximal LAD, mid LAD and Diagonal branch noted during the catheterization which resolved with IC NTG.  Circumflex Artery: Moderate caliber vessel with moderate caliber intermediate branch. Mild plaque in the proximal segments of both vessels.  Right Coronary Artery: Moderate caliber dominant vessel with diffuse 60-70% mid stenosis, diffuse 40% distal stenosis.  Left Ventricular Angiogram: LVEF=60% with subtle hypokinesis of the anteroapical segment.   IVUS Data: (see images in paper chart under cardiology tab). Distal left main with MLA of 5.5 mm2. This suggests that the eccentric lesion seen angiographically is severe and likely flow limiting. Moderate circumferential plaque in entire proximal LAD.    Treatments: surgery:   Median sternotomy, extracorporeal circulation, coronary artery bypass grafting x4 (left internal mammary artery to left anterior descending, saphenous vein graft to first diagonal, saphenous vein graft to first obtuse marginal, saphenous vein graft to posterior descending).  Disposition: Home  The patient has been discharged on:   1.Beta Blocker:  Yes [ x  ]                              No   [   ]                              If No, reason:  2.Ace Inhibitor/ARB: Yes [ x  ]                                     No  [    ]                                     If No, reason:  3.Statin:   Yes [  x ]                  No  [   ]                  If No, reason:  4.Ecasa:  Yes  [  x ]                  No   [   ]                  If No, reason:     Medication List         aspirin 325 MG EC tablet  Take 1 tablet (325 mg total) by mouth daily.     atorvastatin 80 MG tablet  Commonly  known as:  LIPITOR  Take 1  tablet (80 mg total) by mouth daily at 6 PM.     furosemide 40 MG tablet  Commonly known as:  LASIX  Take 1 tablet (40 mg total) by mouth daily. For 5 Days     lisinopril-hydrochlorothiazide 10-12.5 MG per tablet  Commonly known as:  PRINZIDE,ZESTORETIC  Take 1 tablet by mouth daily.     metoprolol tartrate 25 MG tablet  Commonly known as:  LOPRESSOR  Take 0.5 tablets (12.5 mg total) by mouth 2 (two) times daily.     oxyCODONE 5 MG immediate release tablet  Commonly known as:  Oxy IR/ROXICODONE  Take 1-2 tablets (5-10 mg total) by mouth every 3 (three) hours as needed for severe pain.     potassium chloride SA 20 MEQ tablet  Commonly known as:  K-DUR,KLOR-CON  Take 1 tablet (20 mEq total) by mouth daily. For 5 Days          Future Appointments Provider Department Dept Phone   11/29/2013 12:30 PM Loreli Slot, MD [facility] [phone]     Follow-up Information   Follow up with Loreli Slot, MD On 11/29/2013. (Appointment is at 12:30)    Specialty:  Cardiothoracic Surgery   Contact information:   [address] [place] [address] [place] [address] [phone]       Follow up with [facility] On 11/29/2013. (Please get CXR at 11:30)    Contact information:   [place]       Schedule an appointment as soon as possible for a visit with MCALHANY,CHRISTOPHER, MD. (Please contact office to set up 2-4 week follow up)    Specialty:  Cardiology   Contact information:   [address] [place] [address] [phone]       Signed: Lowella Dandy 11/08/2013, 9:10 AM

## 2013-11-07 NOTE — Progress Notes (Signed)
Patient ambulated in hall with daughter 1100 ft unassisted. Tolerated well. Will continue to monitor.

## 2013-11-07 NOTE — Progress Notes (Signed)
11/07/2013 1430 EPW d/c per orders and per protocol. Ends intact. Resistance met with atrial wires, Erin Barrett PAC removed atrial wires. Ventricular wires removed without incident. Pt. Tolerated well. Vital signs collected per protocol. Pt. Advised of bedrest for one hour. Call bell within reach. Will continue to monitor patient.  Mekaela Azizi, Blanchard Kelch

## 2013-11-07 NOTE — Progress Notes (Signed)
CARDIAC REHAB PHASE I   PRE:  Rate/Rhythm: 88 bigiminy then 84 SR    BP: sitting 100/70    SaO2: 97 RA  MODE:  Ambulation: 890 ft   POST:  Rate/Rhythm: 106 ST    BP: sitting 120/70     SaO2: 100 RA  Upon reviewing telemetry pt in Bigeminy, rate 88. Then spontaneously converted to SR. Maintained SR. Tolerated walk well.  No c/o. No RW needed. Quick pace. Will f/u. RN was not called from Central Monitoring about Bigeminy. [id number]  Elissa Lovett Wheaton CES, ACSM 11/07/2013 11:29 AM

## 2013-11-07 NOTE — Progress Notes (Signed)
11/07/2013 4:34 PM Nursing note Pt. Ambulated 550 ft independently with RN supervision. Pt. Tolerated well. Oxygen saturation spot checked during walk and were 98% on room air. Encouraged one more walk this evening.  Sondos Wolfman, Blanchard Kelch

## 2013-11-08 MED ORDER — POTASSIUM CHLORIDE CRYS ER 20 MEQ PO TBCR: 20.0000 meq | EXTENDED_RELEASE_TABLET | Freq: Every day | ORAL | Status: DC

## 2013-11-08 MED ORDER — METOPROLOL TARTRATE 25 MG PO TABS: 12.5000 mg | ORAL_TABLET | Freq: Two times a day (BID) | ORAL | Status: DC

## 2013-11-08 MED ORDER — ATORVASTATIN CALCIUM 80 MG PO TABS: 80.0000 mg | ORAL_TABLET | Freq: Every day | ORAL | Status: DC

## 2013-11-08 MED ORDER — ASPIRIN 325 MG PO TBEC: 325.0000 mg | DELAYED_RELEASE_TABLET | Freq: Every day | ORAL | Status: DC

## 2013-11-08 MED ORDER — FUROSEMIDE 10 MG/ML IJ SOLN
40.0000 mg | Freq: Once | INTRAMUSCULAR | Status: AC
  Administered 2013-11-08: 40 mg via INTRAVENOUS
  Filled 2013-11-08: qty 4

## 2013-11-08 MED ORDER — FUROSEMIDE 40 MG PO TABS: 40.0000 mg | ORAL_TABLET | Freq: Every day | ORAL | Status: DC

## 2013-11-08 MED ORDER — OXYCODONE HCL 5 MG PO TABS: 5.0000 mg | ORAL_TABLET | ORAL | Status: DC | PRN

## 2013-11-08 MED FILL — Heparin Sodium (Porcine) Inj 1000 Unit/ML: INTRAMUSCULAR | Qty: 30 | Status: AC

## 2013-11-08 MED FILL — Potassium Chloride Inj 2 mEq/ML: INTRAVENOUS | Qty: 40 | Status: AC

## 2013-11-08 MED FILL — Magnesium Sulfate Inj 50%: INTRAMUSCULAR | Qty: 10 | Status: AC

## 2013-11-08 MED FILL — Mannitol IV Soln 20%: INTRAVENOUS | Qty: 500 | Status: AC

## 2013-11-08 MED FILL — Heparin Sodium (Porcine) Inj 1000 Unit/ML: INTRAMUSCULAR | Qty: 10 | Status: AC

## 2013-11-08 MED FILL — Sodium Bicarbonate IV Soln 8.4%: INTRAVENOUS | Qty: 50 | Status: AC

## 2013-11-08 MED FILL — Electrolyte-R (PH 7.4) Solution: INTRAVENOUS | Qty: 3000 | Status: AC

## 2013-11-08 MED FILL — Sodium Chloride Irrigation Soln 0.9%: Qty: 3000 | Status: AC

## 2013-11-08 MED FILL — Sodium Chloride IV Soln 0.9%: INTRAVENOUS | Qty: 1000 | Status: AC

## 2013-11-08 MED FILL — Lidocaine HCl IV Inj 20 MG/ML: INTRAVENOUS | Qty: 5 | Status: AC

## 2013-11-08 NOTE — Progress Notes (Signed)
[id number] Education completed with pt. Understanding voiced. Discussed smoking cessation and gave handouts. Discussed CRP 2 and pt gave permission to refer to [facility] Phase 2. Gave heart healthy diet for pt to review. Pt has been under a lot of stress and we talked about him taking care of self and finding things he enjoys. Duanne Limerick, RN BSN 9:55 AM 11/08/2013

## 2013-11-08 NOTE — Progress Notes (Signed)
11/08/2013 1030 CTS removed per orders and per protocol. Benzoin and steri strips applied to sites.  Christian Borgerding, Blanchard Kelch

## 2013-11-08 NOTE — Progress Notes (Signed)
11/08/2013 1230  Incision site care and how to properly use TED hose reviewed with patient. Questions and concerns addressed. Pt. States his ride should be here shortly. Encouraged pt. To notify RN of arrival of family so that instructions can be reviewed with caregivers at bedside. Will continue to monitor patient.  Lonnie [name], Blanchard Kelch

## 2013-11-08 NOTE — Progress Notes (Signed)
      [address]       [place] [address]             [phone]      4 Days Post-Op Procedure(s) (LRB): CORONARY ARTERY BYPASS GRAFTING (CABG) TIMES FOUR  USING LEFT INTERNAL MAMMARY ARTERY AND RIGHT AND LEFT SAPHENOUS LEG VEIN HARVESTED ENDOSCOPICALLY (N/A)  Subjective:  No complaints.  Patient looks great.  He is ambulating independently + BM  Objective: Vital signs in last 24 hours: Temp:  [98.4 F (36.9 C)-99.5 F (37.5 C)] 98.5 F (36.9 C) (11/18 0452) Pulse Rate:  [71-88] 75 (11/18 0429) Cardiac Rhythm:  [-] Normal sinus rhythm (11/18 0720) Resp:  [16-22] 18 (11/18 0429) BP: (101-139)/(58-78) 107/64 mmHg (11/18 0429) SpO2:  [94 %-100 %] 98 % (11/18 0429) Weight:  [175 lb 4.3 oz (79.5 kg)] 175 lb 4.3 oz (79.5 kg) (11/18 0429)  Intake/Output from previous day: 11/17 0701 - 11/18 0700 In: 480 [P.O.:480] Out: 2000 [Urine:2000] Intake/Output this shift: Total I/O In: -  Out: 200 [Urine:200]  General appearance: alert, cooperative and no distress Heart: regular rate and rhythm Lungs: clear to auscultation bilaterally Abdomen: soft, non-tender; bowel sounds normal; no masses,  no organomegaly Extremities: edema 1-2+ pitting Wound: clean and dry  Lab Results:  Recent Labs  11/05/13 1800 11/05/13 1801 11/06/13 0438  WBC 11.5*  --  12.7*  HGB 10.4* 10.5* 9.6*  HCT 30.1* 31.0* 28.9*  PLT 202  --  195   BMET:  Recent Labs  11/05/13 1801 11/06/13 0438  NA 139 134*  K 4.3 4.0  CL 101 102  CO2  --  26  GLUCOSE 134* 143*  BUN 13 14  CREATININE 1.20 0.88  CALCIUM  --  8.7    PT/INR: No results found for this basename: LABPROT, INR,  in the last 72 hours ABG    Component Value Date/Time   PHART 7.368 11/05/2013 0144   HCO3 24.1* 11/05/2013 0144   TCO2 26 11/05/2013 1801   ACIDBASEDEF 1.0 11/05/2013 0144   O2SAT 98.0 11/05/2013 0144   CBG (last 3)   Recent Labs  11/06/13 0746 11/06/13 1212 11/06/13 1631  GLUCAP 84 89 113*      Assessment/Plan: S/P Procedure(s) (LRB): CORONARY ARTERY BYPASS GRAFTING (CABG) TIMES FOUR  USING LEFT INTERNAL MAMMARY ARTERY AND RIGHT AND LEFT SAPHENOUS LEG VEIN HARVESTED ENDOSCOPICALLY (N/A)  1. CV- NSR good rate and pressure control- continue Lisinopril and Lopressor 2. Pulm- no acute issues, encouraged use of IS at discharge 3. Renal- weight is about 10 lbs above admission, will give dose of IV Lasix prior to discharge and give short course at discharge 4. Dispo- patient doing very well, will d/c home today   LOS: 10 days    Raford Pitcher, [place] Espina 11/08/2013

## 2013-11-08 NOTE — Progress Notes (Signed)
Patient ambulating in halls without assistance, he has done several laps around the unit. Tolerated well.

## 2013-11-08 NOTE — Progress Notes (Addendum)
11/08/2013 2:24 PM Nursing note  Discharge avs form, medications already taken today and those due this evening given and explained to patient and wife. Follow up appointments, incision site care, activity restrictions and when to call MD reviewed. D/c home with wife per orders. Pt. And wife viewed video #113 prior to discharge and received Moving Right Along Book. Questions and concerns addressed.  Earlyn Sylvan, Blanchard Kelch

## 2013-11-08 NOTE — Progress Notes (Signed)
11/08/2013 0845 Nursing note Lowella Dandy PAC on floor and made aware of pt. Occasional runs of Bigeminy PVC, non-sustaining and converting back to NSR. No new orders received at this time. Will continue to closely monitor patient.  Yazmen Briones, Blanchard Kelch

## 2013-11-10 ENCOUNTER — Telehealth: Payer: Self-pay | Admitting: Cardiovascular Disease

## 2013-11-10 NOTE — Telephone Encounter (Signed)
We can use 21 mg Nicotine patches. Thanks, chris

## 2013-11-10 NOTE — Telephone Encounter (Signed)
Spoke with pt's wife who is requesting something to help pt quit smoking. States nicotine patch was discussed when he was in hospital but he was able to go without smoking when in hospital. Having a difficult time not resuming smoking since discharge. Has tried nicorette gum and e-cigarettes in past without success.

## 2013-11-10 NOTE — Telephone Encounter (Signed)
New Problem'   Pt has a problem with smoking// requests a call back with something to help him stop smoking please call.

## 2013-11-11 MED ORDER — NICOTINE 21 MG/24HR TD PT24: MEDICATED_PATCH | TRANSDERMAL | Status: DC

## 2013-11-11 NOTE — Telephone Encounter (Signed)
Reviewed with Alfonse Ras, PharmD and this is over the counter. Pt should use 21 mg patches daily for 6 weeks, then 14 mg patches daily for 2 weeks, then 7 mg patches for 2 weeks and then stop. I spoke with pt's wife and gave her this information.

## 2013-11-13 ENCOUNTER — Encounter (HOSPITAL_COMMUNITY): Payer: Self-pay | Admitting: Emergency Medicine

## 2013-11-13 ENCOUNTER — Emergency Department (HOSPITAL_COMMUNITY)
Admission: EM | Admit: 2013-11-13 | Discharge: 2013-11-13 | Disposition: A | Discharge status: Home / Self Care | Payer: 59 | Attending: Emergency Medicine | Admitting: Emergency Medicine

## 2013-11-13 DIAGNOSIS — Z7982 Long term (current) use of aspirin: Secondary | ICD-10-CM | POA: Insufficient documentation

## 2013-11-13 DIAGNOSIS — IMO0002 Reserved for concepts with insufficient information to code with codable children: Secondary | ICD-10-CM | POA: Insufficient documentation

## 2013-11-13 DIAGNOSIS — Z951 Presence of aortocoronary bypass graft: Secondary | ICD-10-CM | POA: Insufficient documentation

## 2013-11-13 DIAGNOSIS — I209 Angina pectoris, unspecified: Secondary | ICD-10-CM | POA: Insufficient documentation

## 2013-11-13 DIAGNOSIS — T792XXA Traumatic secondary and recurrent hemorrhage and seroma, initial encounter: Secondary | ICD-10-CM

## 2013-11-13 DIAGNOSIS — I10 Essential (primary) hypertension: Secondary | ICD-10-CM | POA: Insufficient documentation

## 2013-11-13 DIAGNOSIS — Z79899 Other long term (current) drug therapy: Secondary | ICD-10-CM | POA: Insufficient documentation

## 2013-11-13 DIAGNOSIS — F17200 Nicotine dependence, unspecified, uncomplicated: Secondary | ICD-10-CM | POA: Insufficient documentation

## 2013-11-13 DIAGNOSIS — Y838 Other surgical procedures as the cause of abnormal reaction of the patient, or of later complication, without mention of misadventure at the time of the procedure: Secondary | ICD-10-CM | POA: Insufficient documentation

## 2013-11-13 NOTE — ED Notes (Signed)
Per pt sts he was sitting on the couch and his incision from CABG was bleeding. sts soaked through 2 shirts.

## 2013-11-13 NOTE — ED Provider Notes (Signed)
CSN: [id number]     Arrival date & time 11/13/13  1228 History   First MD Initiated Contact with Patient 11/13/13 1237     Chief Complaint  Patient presents with  . Coagulation Disorder   (Consider location/radiation/quality/duration/timing/severity/associated sxs/prior Treatment) HPI Comments: Patient with h/o CABG performed 11/04/13 after STEMI -- presents with complaint of blood draining from median sternotomy site. Bleeding began approximately one hour prior to arrival. It began at rest while sitting on the couch. Patient noted that the drainage "had a pulse". He soaked 2 shirts. No other treatments prior to arrival. He was told to come to the emergency department for evaluation. Patient has not felt lightheaded or dizzy, he has not passed out. No chest pain or shortness of breath. Patient reports having bilateral shoulder pain yesterday which resolved after 90 minutes with oxycodone. The onset of this condition was acute. The course is improving. Aggravating factors: none. Alleviating factors: none.    The history is provided by the patient and medical records.    Past Medical History  Diagnosis Date  . Hypertension   . Tobacco abuse   . Anginal pain    Past Surgical History  Procedure Laterality Date  . Toe surgery    . Appendectomy    . Knee surgery      fractured patella  . Mouth surgery    . Coronary artery bypass graft N/A 11/04/2013    Procedure: CORONARY ARTERY BYPASS GRAFTING (CABG) TIMES FOUR  USING LEFT INTERNAL MAMMARY ARTERY AND RIGHT AND LEFT SAPHENOUS LEG VEIN HARVESTED ENDOSCOPICALLY;  Surgeon: Loreli Slot, MD;  [facility]: [facility];  Service: Open Heart Surgery;  Laterality: N/A;   Family History  Problem Relation Age of Onset  . CAD Father 36  . Cardiomyopathy Daughter    History  Substance Use Topics  . Smoking status: Current Every Day Smoker -- 1.00 packs/day for 30 years    Types: Cigarettes  . Smokeless tobacco: Not on file  . Alcohol Use: No     Review of Systems  Constitutional: Negative for fever.  HENT: Negative for rhinorrhea and sore throat.   Eyes: Negative for redness.  Respiratory: Negative for cough.   Cardiovascular: Negative for chest pain.  Gastrointestinal: Negative for nausea, vomiting, abdominal pain and diarrhea.  Genitourinary: Negative for dysuria.  Musculoskeletal: Negative for myalgias.  Skin: Positive for wound. Negative for rash.  Neurological: Negative for headaches.    Allergies  Dairy aid; Eggs or egg-derived products; and Peanut-containing drug products  Home Medications   Current Outpatient Rx  Name  Route  Sig  Dispense  Refill  . acetaminophen (TYLENOL) 500 MG tablet   Oral   Take 1,000 mg by mouth every 6 (six) hours as needed.         Marland Kitchen aspirin EC 325 MG EC tablet   Oral   Take 1 tablet (325 mg total) by mouth daily.   30 tablet   0   . atorvastatin (LIPITOR) 80 MG tablet   Oral   Take 1 tablet (80 mg total) by mouth daily at 6 PM.   30 tablet   3   . furosemide (LASIX) 40 MG tablet   Oral   Take 1 tablet (40 mg total) by mouth daily. For 5 Days   5 tablet   0   . lisinopril-hydrochlorothiazide (PRINZIDE,ZESTORETIC) 10-12.5 MG per tablet   Oral   Take 1 tablet by mouth daily.         Marland Kitchen  metoprolol tartrate (LOPRESSOR) 25 MG tablet   Oral   Take 0.5 tablets (12.5 mg total) by mouth 2 (two) times daily.   30 tablet   3   . nicotine (NICODERM CQ - DOSED IN MG/24 HOURS) 21 mg/24hr patch      21 mg daily for 6 weeks, 14 mg daily for 2 weeks, 7 mg daily for 2 weeks and then stop   28 patch   0   . oxyCODONE (OXY IR/ROXICODONE) 5 MG immediate release tablet   Oral   Take 1-2 tablets (5-10 mg total) by mouth every 3 (three) hours as needed for severe pain.   30 tablet   0   . potassium chloride SA (K-DUR,KLOR-CON) 20 MEQ tablet   Oral   Take 1 tablet (20 mEq total) by mouth daily. For 5 Days   5 tablet   0    BP 134/66  Pulse 69  Temp(Src) 98.7 F (37.1  C) (Core (Comment))  Resp 16  Wt 171 lb 3.2 oz (77.656 kg)  SpO2 100%  Physical Exam  Nursing note and vitals reviewed. Constitutional: He appears well-developed and well-nourished.  HENT:  Head: Normocephalic and atraumatic.  Eyes: Conjunctivae are normal. Right eye exhibits no discharge. Left eye exhibits no discharge.  Neck: Normal range of motion. Neck supple.  Cardiovascular: Normal rate, regular rhythm and normal heart sounds.   Pulmonary/Chest: Effort normal and breath sounds normal. He exhibits tenderness.  Median sternotomy well healing. 1-2 cm of dehiscence at inferior aspect of wound. No current drainage. There is a small amount of blood noted inside wound. No surrounding erythema or warmth.   Abdominal: Soft. There is no tenderness.  Neurological: He is alert.  Skin: Skin is warm and dry.  Psychiatric: He has a normal mood and affect.    ED Course  Procedures (including critical care time) Labs Review Labs Reviewed - No data to display Imaging Review No results found.  EKG Interpretation   None      1:05 PM Patient seen and examined. D/w Dr. Blinda Leatherwood who has seen. Will touch base with CT surgery. Feel this was probably a seroma. No current drainage.   Vital signs reviewed and are as follows: Filed Vitals:   11/13/13 1232  BP: 134/66  Pulse: 69  Temp: 98.7 F (37.1 C)  Resp: 16   Spoke with Dr. Tyrone Sage. Patient to f/u with Dr. Bolivar Haw in 2 days.   Patient had return of oozing with position. This stopped quickly. Fluid is light pink, not bright red. Counseled on wound care.    MDM   1. Postoperative seroma, initial encounter    Patient with small area of dehiscence of median sternotomy with serosanguinous drainage c/w seroma. Oozing slowed, very minimal now. Will d/c to home with wound check by CT surgery in 2 days.      Renne Crigler, PA-C 11/13/13 1528

## 2013-11-14 ENCOUNTER — Ambulatory Visit (INDEPENDENT_AMBULATORY_CARE_PROVIDER_SITE_OTHER): Payer: 59 | Admitting: Physician Assistant

## 2013-11-14 VITALS — BP 123/77 | HR 88 | Temp 99.3°F | Resp 20 | Ht 74.0 in | Wt 171.0 lb

## 2013-11-14 DIAGNOSIS — Z951 Presence of aortocoronary bypass graft: Secondary | ICD-10-CM

## 2013-11-14 DIAGNOSIS — I2510 Atherosclerotic heart disease of native coronary artery without angina pectoris: Secondary | ICD-10-CM

## 2013-11-14 MED ORDER — OXYCODONE HCL 5 MG PO TABS: 5.0000 mg | ORAL_TABLET | ORAL | Status: DC | PRN

## 2013-11-14 NOTE — Progress Notes (Signed)
       [address]       [name] [address]             [phone]          HPI: The patient is status post CABG by Dr. Dorris Fetch on 11/04/2013.  He did well postoperatively and was discharged home on 11/07/2013 in good condition. Over the weekend, he reports that the lower portion of his sternal wound developed pulsatile bloody drainage.  No chest pain, fever, chills, swelling, erythema or purulence.  He was seen in the [facility] ER and the drainage had stopped at that point and steri-strips were applied.  He was asked to follow up with our office today for recheck. He has not noticed any further drainage.    Current Outpatient Prescriptions  Medication Sig Dispense Refill  . aspirin EC 325 MG EC tablet Take 1 tablet (325 mg total) by mouth daily.  30 tablet  0  . atorvastatin (LIPITOR) 80 MG tablet Take 1 tablet (80 mg total) by mouth daily at 6 PM.  30 tablet  3  . lisinopril-hydrochlorothiazide (PRINZIDE,ZESTORETIC) 10-12.5 MG per tablet Take 1 tablet by mouth daily.      . metoprolol tartrate (LOPRESSOR) 25 MG tablet Take 0.5 tablets (12.5 mg total) by mouth 2 (two) times daily.  30 tablet  3  . nicotine (NICODERM CQ - DOSED IN MG/24 HOURS) 21 mg/24hr patch 21 mg daily for 6 weeks, 14 mg daily for 2 weeks, 7 mg daily for 2 weeks and then stop  28 patch  0  . oxyCODONE (OXY IR/ROXICODONE) 5 MG immediate release tablet Take 1-2 tablets (5-10 mg total) by mouth every 3 (three) hours as needed for severe pain.  30 tablet  0  . acetaminophen (TYLENOL) 500 MG tablet Take 1,000 mg by mouth every 6 (six) hours as needed.       No current facility-administered medications for this visit.     Physical Exam: BP 124/77 HR 88 Resp 20 Wounds: Sternal wound is intact, no drainage, erythema, dehiscence or purulence noted.  I pressed around the wound and was unable to noted any drainage, and the area was not tender. Sternum was stable. Heart: RRR Lungs:  Clear Extremities: Mild RLE edema   Diagnostic Tests: Chest xray: No results found.     Assessment/Plan: The bleeding most likely was from a superficial skin edge bleeder and resolved without intervention. I refilled his oxycodone #30 for pain.  He will see [place] back at his previously scheduled appointment in 2 weeks.   I have asked him to call if he notes any further problem.

## 2013-11-15 NOTE — ED Provider Notes (Signed)
Medical screening examination/treatment/procedure(s) were conducted as a shared visit with non-physician practitioner(s) and myself.  I personally evaluated the patient during the encounter.   Patient presents to the ER for evaluation of bleeding from his midline sternotomy scar. Examination reveals a 1 cm length of the incision which is slightly open at the skin level. No active bleeding currently. This is most likely a seroma which has opened and had some bleeding. Bleeding is now controlled. Does not appear to be infected. Followup with cardiac surgery arranged, no other interventions other than local wound care necessary.  Gilda Crease, MD 11/15/13 [phone]

## 2013-11-16 ENCOUNTER — Encounter: Payer: Self-pay | Admitting: Cardiovascular Disease

## 2013-11-16 ENCOUNTER — Ambulatory Visit (INDEPENDENT_AMBULATORY_CARE_PROVIDER_SITE_OTHER): Payer: 59 | Admitting: Cardiovascular Disease

## 2013-11-16 VITALS — BP 124/70 | HR 80 | Ht 74.0 in | Wt 166.0 lb

## 2013-11-16 DIAGNOSIS — I2510 Atherosclerotic heart disease of native coronary artery without angina pectoris: Secondary | ICD-10-CM

## 2013-11-16 DIAGNOSIS — I2589 Other forms of chronic ischemic heart disease: Secondary | ICD-10-CM

## 2013-11-16 DIAGNOSIS — F17200 Nicotine dependence, unspecified, uncomplicated: Secondary | ICD-10-CM

## 2013-11-16 DIAGNOSIS — Z720 Tobacco use: Secondary | ICD-10-CM

## 2013-11-16 DIAGNOSIS — I255 Ischemic cardiomyopathy: Secondary | ICD-10-CM

## 2013-11-16 DIAGNOSIS — I10 Essential (primary) hypertension: Secondary | ICD-10-CM

## 2013-11-16 NOTE — Patient Instructions (Signed)
Your physician recommends that you schedule a follow-up appointment in:  3 months.   Your physician has requested that you have an echocardiogram. Echocardiography is a painless test that uses sound waves to create images of your heart. It provides your doctor with information about the size and shape of your heart and how well your heart's chambers and valves are working. This procedure takes approximately one hour. There are no restrictions for this procedure. To be done week of December 19, 2013

## 2013-11-16 NOTE — Progress Notes (Signed)
History of Present Illness: 62 yo male with history of tobacco abuse, HTN and recent diagnosis of CAD who is here today for cardiac follow up. He was admitted to [facility] 10/29/13 with anterior STEMI and found to have moderately severe left main and LAD stenosis, moderately severe RCA stenosis. I did not initially see a culprit vessel. He continued to have angina and repeat cath on 10/31/13 with IVUS of the left main artery showed significant disease. He underwent 4V CABG on 11/04/13 per Dr. Dorris Fetch (LIMA to mid LAD, SVG to OM, SVG to PDA, SVG to Diagonal). His post-operative course was uneventful. LVEF=40-45% prior to bypass.   He is here today for follow up. He has been doing well. He has had some shoulder pain that is worse with movement. It is much better. No chest pain or SOB. No palpitations.   Primary Care Physician: Dr. Earlene Plater ([facility])  Last Lipid Profile:Lipid Panel     Component Value Date/Time   CHOL 152 10/30/2013 0420   TRIG 76 10/30/2013 0420   HDL 43 10/30/2013 0420   CHOLHDL 3.5 10/30/2013 0420   VLDL 15 10/30/2013 0420   LDLCALC 94 10/30/2013 0420     Past Medical History  Diagnosis Date  . Hypertension   . Tobacco abuse   . Anginal pain     Past Surgical History  Procedure Laterality Date  . Toe surgery    . Appendectomy    . Knee surgery      fractured patella  . Mouth surgery    . Coronary artery bypass graft N/A 11/04/2013    Procedure: CORONARY ARTERY BYPASS GRAFTING (CABG) TIMES FOUR  USING LEFT INTERNAL MAMMARY ARTERY AND RIGHT AND LEFT SAPHENOUS LEG VEIN HARVESTED ENDOSCOPICALLY;  Surgeon: Loreli Slot, MD;  Location: [facility] OR;  Service: Open Heart Surgery;  Laterality: N/A;    Current Outpatient Prescriptions  Medication Sig Dispense Refill  . aspirin EC 325 MG EC tablet Take 1 tablet (325 mg total) by mouth daily.  30 tablet  0  . atorvastatin (LIPITOR) 80 MG tablet Take 1 tablet (80 mg total) by mouth daily at 6 PM.   30 tablet  3  . lisinopril-hydrochlorothiazide (PRINZIDE,ZESTORETIC) 10-12.5 MG per tablet Take 1 tablet by mouth daily.      . metoprolol tartrate (LOPRESSOR) 25 MG tablet Take 0.5 tablets (12.5 mg total) by mouth 2 (two) times daily.  30 tablet  3  . nicotine (NICODERM CQ - DOSED IN MG/24 HOURS) 21 mg/24hr patch EQUATE BRAND 21 mg daily for 6 weeks, 14 mg daily for 2 weeks, 7 mg daily for 2 weeks and then stop      . oxyCODONE (OXY IR/ROXICODONE) 5 MG immediate release tablet Take 1-2 tablets (5-10 mg total) by mouth every 3 (three) hours as needed for severe pain.  30 tablet  0   No current facility-administered medications for this visit.    Allergies  Allergen Reactions  . Dairy Aid [Lactase]   . Eggs Or Egg-Derived Products   . Peanut-Containing Drug Products     Does not know which nuts, but states nuts make him vomit    History   Social History  . Marital Status: Married    Spouse Name: N/A    Number of Children: 1  . Years of Education: N/A   Occupational History  . Photographer   Social History Main Topics  . Smoking status: Current Every Day Smoker -- 1.00  packs/day for 30 years    Types: Cigarettes  . Smokeless tobacco: Not on file  . Alcohol Use: No  . Drug Use: No  . Sexual Activity: Yes   Other Topics Concern  . Not on file   Social History Narrative  . No narrative on file    Family History  Problem Relation Age of Onset  . CAD Father 36  . Cardiomyopathy Daughter     Review of Systems:  As stated in the HPI and otherwise negative.   BP 124/70  Pulse 80  Ht 6\' 2"  (1.88 m)  Wt 166 lb (75.297 kg)  BMI 21.30 kg/m2  Physical Examination: General: Well developed, well nourished, NAD HEENT: OP clear, mucus membranes moist SKIN: warm, dry. No rashes. Neuro: No focal deficits Musculoskeletal: Muscle strength 5/5 all ext Psychiatric: Mood and affect normal Neck: No JVD, no carotid bruits, no thyromegaly, no lymphadenopathy. Lungs:Clear  bilaterally, no wheezes, rhonci, crackles Cardiovascular: Regular rate and rhythm. No murmurs, gallops or rubs. Abdomen:Soft. Bowel sounds present. Non-tender.  Extremities: No lower extremity edema. Pulses are 2 + in the bilateral DP/PT.  EKG: NSR, rate 77 bpm.   Assessment and Plan:   1. CAD: s/p recent CABG. Doing well. Continue ASA, statin, beta blocker, Ace-inh.   2. Ischemic Cardiomyopathy: Will continue current meds. Will repeat echo end of December.   3. Tobacco abuse: Complete cessation advised.   4. HTN: BP well controlled.

## 2013-11-22 ENCOUNTER — Telehealth: Payer: Self-pay | Admitting: Cardiovascular Disease

## 2013-11-22 ENCOUNTER — Other Ambulatory Visit: Payer: Self-pay | Admitting: *Deleted

## 2013-11-22 DIAGNOSIS — Z951 Presence of aortocoronary bypass graft: Secondary | ICD-10-CM

## 2013-11-22 DIAGNOSIS — G8918 Other acute postprocedural pain: Secondary | ICD-10-CM

## 2013-11-22 MED ORDER — OXYCODONE HCL 5 MG PO TABS: 5.0000 mg | ORAL_TABLET | ORAL | Status: DC | PRN

## 2013-11-22 NOTE — Telephone Encounter (Signed)
Left message to call back  

## 2013-11-22 NOTE — Telephone Encounter (Signed)
New Problem  Pt is in need of more pain medication--Oxycodone // please call to refill

## 2013-11-22 NOTE — Telephone Encounter (Signed)
Follow up    [name] called back please return her call.

## 2013-11-22 NOTE — Telephone Encounter (Signed)
Received call from front desk that pt's wife was here in office and requesting pain medication prescription for pt. I spoke with her and explained that pain medication would be prescribed by surgeon as they were continuing to follow him post op and she should contact them. I also informed her that Dr. Clifton James was not in the office today.   Wife then asked me to contact surgeon's office as it was late in the day to request refill.  I told pt's wife I would have our nurse manager come in to talk with her.  Doylene Bode, RN went to talk with pt's wife a few minutes later but wife had left.  Doylene Bode has since placed call to pt's wife but there was no answer.  Doylene Bode will continue to try to reach pt's wife.

## 2013-11-23 NOTE — Telephone Encounter (Signed)
Pain medication was refilled by Dr. Dorris Fetch on November 22, 2013

## 2013-11-24 ENCOUNTER — Encounter (HOSPITAL_COMMUNITY)
Admission: RE | Admit: 2013-11-24 | Discharge: 2013-11-24 | Disposition: A | Discharge status: Home / Self Care | Payer: 59 | Source: Ambulatory Visit | Attending: Cardiovascular Disease | Admitting: Cardiovascular Disease

## 2013-11-24 ENCOUNTER — Other Ambulatory Visit: Payer: Self-pay | Admitting: *Deleted

## 2013-11-24 DIAGNOSIS — I2510 Atherosclerotic heart disease of native coronary artery without angina pectoris: Secondary | ICD-10-CM

## 2013-11-24 DIAGNOSIS — I2109 ST elevation (STEMI) myocardial infarction involving other coronary artery of anterior wall: Secondary | ICD-10-CM | POA: Insufficient documentation

## 2013-11-24 DIAGNOSIS — Z5189 Encounter for other specified aftercare: Secondary | ICD-10-CM | POA: Insufficient documentation

## 2013-11-24 DIAGNOSIS — F17200 Nicotine dependence, unspecified, uncomplicated: Secondary | ICD-10-CM | POA: Insufficient documentation

## 2013-11-24 DIAGNOSIS — Z7982 Long term (current) use of aspirin: Secondary | ICD-10-CM | POA: Insufficient documentation

## 2013-11-24 DIAGNOSIS — Z79899 Other long term (current) drug therapy: Secondary | ICD-10-CM | POA: Insufficient documentation

## 2013-11-24 DIAGNOSIS — I10 Essential (primary) hypertension: Secondary | ICD-10-CM | POA: Insufficient documentation

## 2013-11-24 NOTE — Progress Notes (Signed)
Cardiac Rehab Medication Review by a Pharmacist  Does the patient  feel that his/her medications are working for him/her?  yes  Has the patient been experiencing any side effects to the medications prescribed?  no  Does the patient measure his/her own blood pressure or blood glucose at home?  no   Does the patient have any problems obtaining medications due to transportation or finances?   no  Understanding of regimen: good Understanding of indications: good Potential of compliance: good  Pharmacist comments: Mr. Kronenberger is a pleasant soft-spoken 62 yo M presenting to cardiac rehab this morning.  He did not have any medications with him, but did bring a list of active meds.  Concerning his allergies, they are all food related, none specific to medications.  Our conversation centered mainly around his pain from recent procedure and not wanting, but needing to take the oxycodone.  He tried to take some OTC (did not specify what) for pain, but that did not work.  Last week, he contorted in pain so much it opened an incision which has now been treated.  If he even feels pain coming on, he now takes the pain med to preemptively stop this from reoccurring.    Shelba Flake Achilles Dunk, PharmD Clinical Pharmacist - Resident Pager: [phone] Pharmacy: [phone] 11/24/2013 9:43 AM

## 2013-11-28 NOTE — Telephone Encounter (Signed)
Doylene Bode, RN has tried to contact pt's wife but has been unable to reach her.  Will close this note.

## 2013-11-29 ENCOUNTER — Ambulatory Visit (INDEPENDENT_AMBULATORY_CARE_PROVIDER_SITE_OTHER): Payer: 59 | Admitting: Thoracic Surgery (Cardiothoracic Vascular Surgery)

## 2013-11-29 ENCOUNTER — Other Ambulatory Visit: Payer: Self-pay

## 2013-11-29 ENCOUNTER — Ambulatory Visit
Admission: RE | Admit: 2013-11-29 | Discharge: 2013-11-29 | Disposition: A | Discharge status: Home / Self Care | Payer: 59 | Source: Ambulatory Visit | Attending: Thoracic Surgery (Cardiothoracic Vascular Surgery) | Admitting: Thoracic Surgery (Cardiothoracic Vascular Surgery)

## 2013-11-29 ENCOUNTER — Encounter: Payer: Self-pay | Admitting: Thoracic Surgery (Cardiothoracic Vascular Surgery)

## 2013-11-29 VITALS — BP 127/82 | HR 75 | Resp 20 | Ht 72.0 in | Wt 167.0 lb

## 2013-11-29 DIAGNOSIS — Z951 Presence of aortocoronary bypass graft: Secondary | ICD-10-CM

## 2013-11-29 DIAGNOSIS — I2510 Atherosclerotic heart disease of native coronary artery without angina pectoris: Secondary | ICD-10-CM

## 2013-11-29 IMAGING — CR DG CHEST 2V
2 series · 2 of 2 positions shown · non-contrast
Comparison: [DATE]

CLINICAL DATA: Status post coronary artery bypass grafting

EXAM:
CHEST  2 VIEW

[w chest pa]
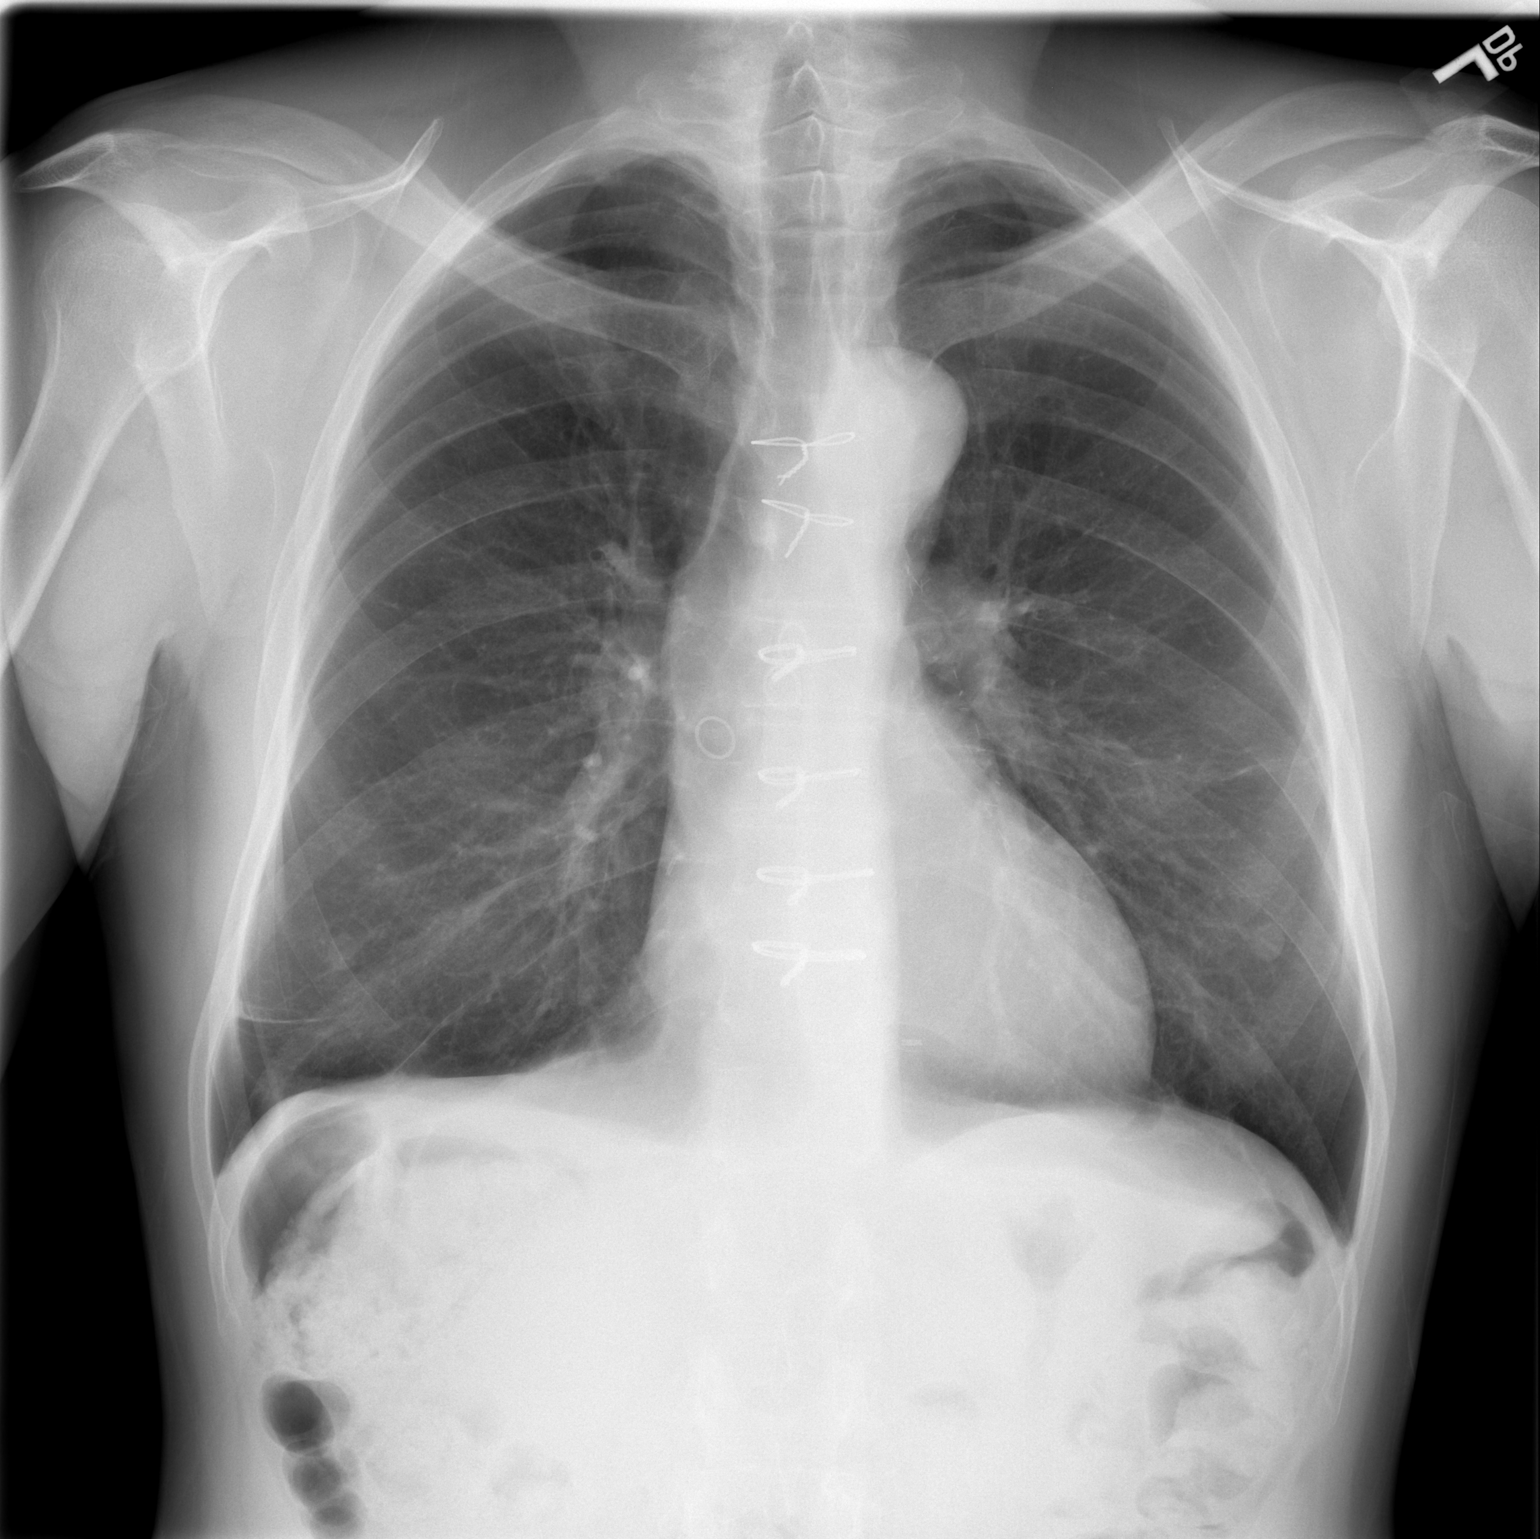

[w chest lat]
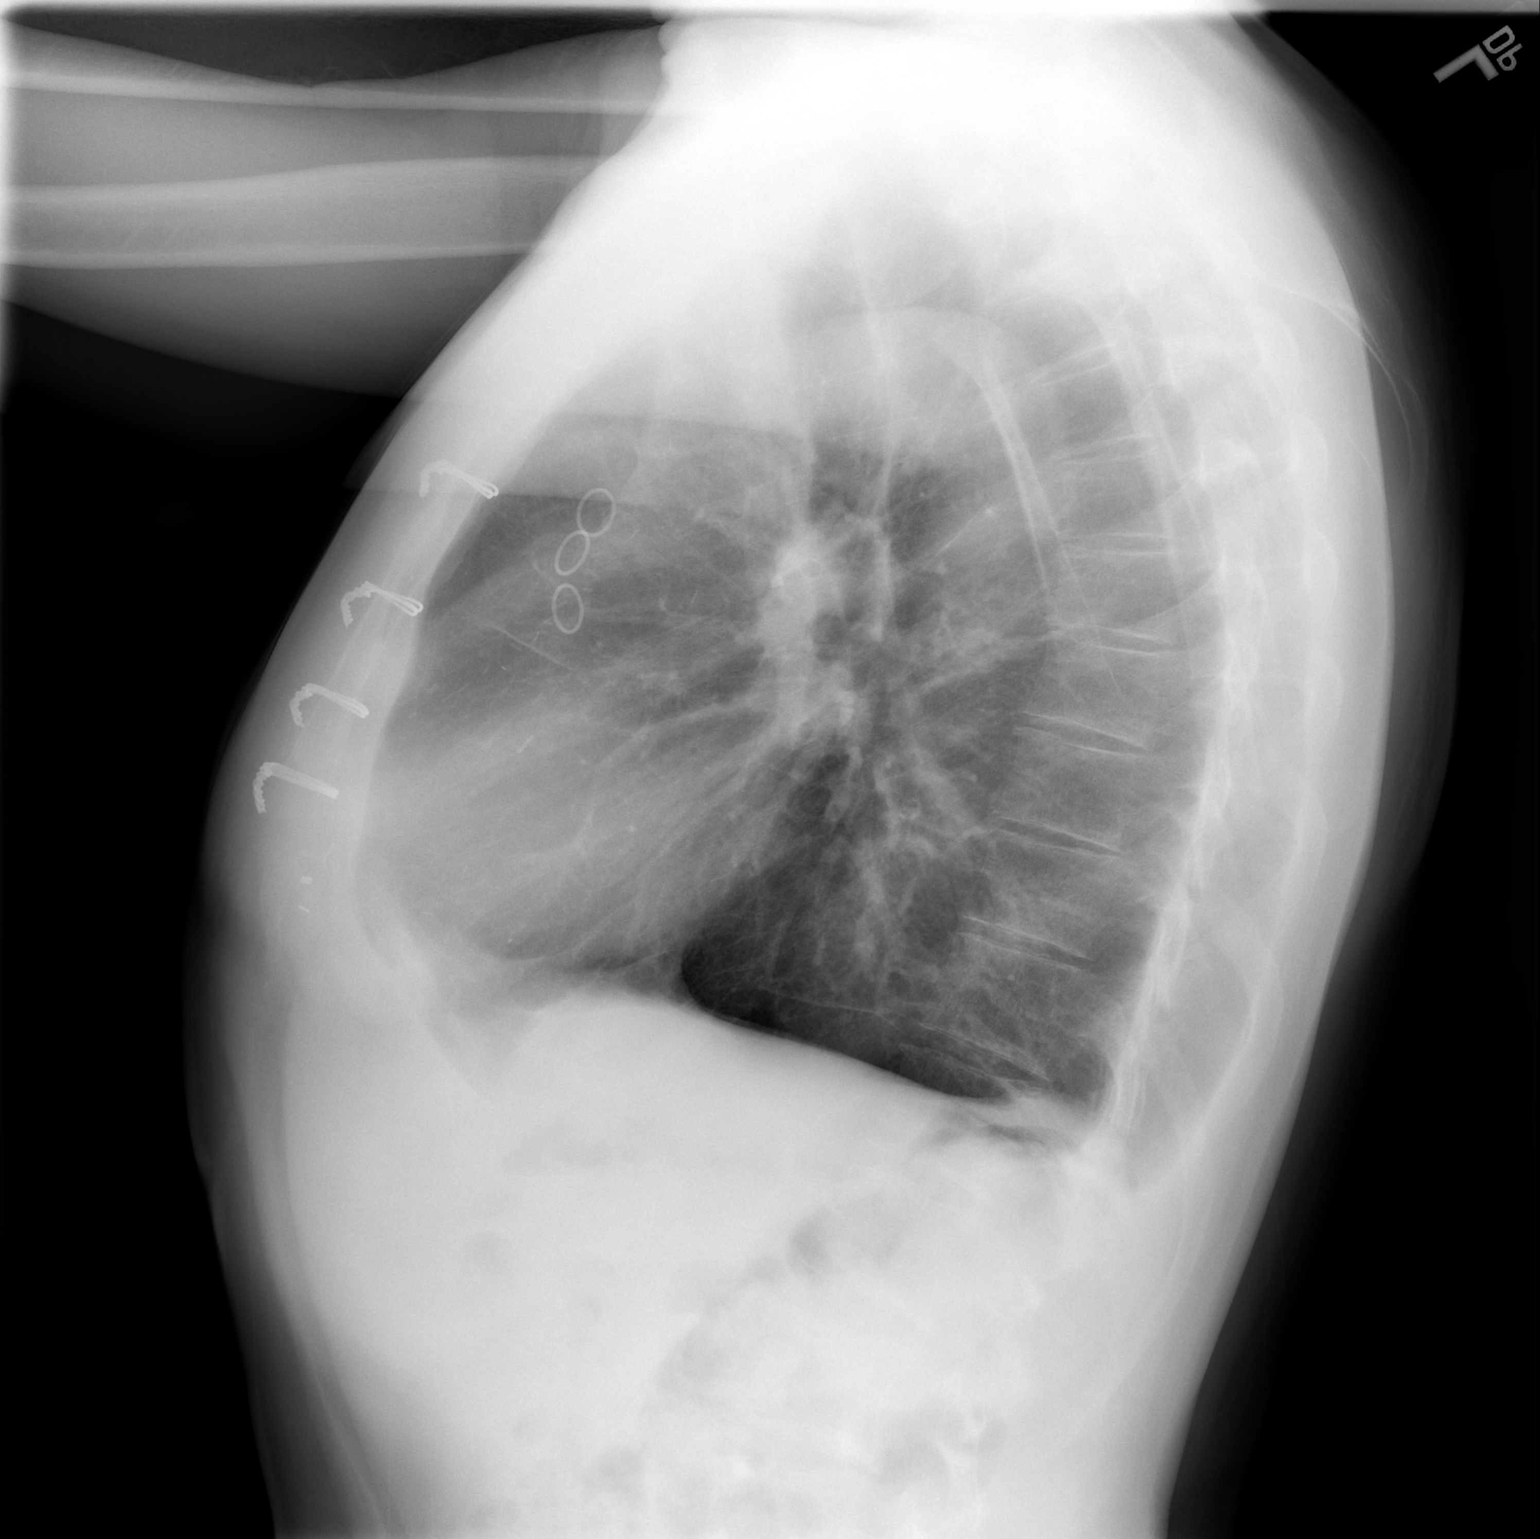

[2 of 2 positions shown; findings below may reference images not displayed]

FINDINGS: The cardiac silhouette stent normal limits. The patient is status
post median sternotomy coronary artery bypass grafting. There is
flattening of the hemidiaphragms. Areas of discoid atelectasis
project within the peripheral base of right lower lobe along the
lateral mid periphery of the left hemithorax. Symmetric nipple
shadows identified within the lung bases. There is no evidence of
focal infiltrates, effusions, nor edema. There is no evidence for
pneumothorax. The osseous structures unremarkable.
IMPRESSION: COPD by evidence of acute cardiopulmonary disease.

## 2013-11-29 MED ORDER — OXYCODONE HCL 5 MG PO TABS: 5.0000 mg | ORAL_TABLET | Freq: Four times a day (QID) | ORAL | Status: DC | PRN

## 2013-11-29 MED ORDER — VARENICLINE TARTRATE 0.5 MG X 11 & 1 MG X 42 PO MISC: ORAL | Status: DC

## 2013-11-29 NOTE — Telephone Encounter (Signed)
RX for Chantix Pak faxed to [facility]

## 2013-11-29 NOTE — Progress Notes (Signed)
HPI:  Is to return in time is a 62 year old gentleman who presented with an ST elevation MI. He had left main and three-vessel coronary disease. He underwent coronary bypass grafting x4 on November 14. His postoperative course was uncomplicated.  He says that he is still having incisional pain that requires oxycodone. He says Tylenol and Advil have not been effective. He woke up this morning in an awkward position and has been having a lot of pain today. He also has been having some pain around his collarbones and shoulders. He has not had any anginal pain or shortness of breath. His appetite is improving. He wants to quit smoking he says he still has times when he feels stressed. He is currently smoking about 4 cigarettes a day.  Past Medical History  Diagnosis Date  . Hypertension   . Tobacco abuse   . Anginal pain       Current Outpatient Prescriptions  Medication Sig Dispense Refill  . aspirin EC 325 MG EC tablet Take 1 tablet (325 mg total) by mouth daily.  30 tablet  0  . atorvastatin (LIPITOR) 80 MG tablet Take 1 tablet (80 mg total) by mouth daily at 6 PM.  30 tablet  3  . lisinopril-hydrochlorothiazide (PRINZIDE,ZESTORETIC) 10-12.5 MG per tablet Take 1 tablet by mouth every morning.       . metoprolol tartrate (LOPRESSOR) 25 MG tablet Take 0.5 tablets (12.5 mg total) by mouth 2 (two) times daily.  30 tablet  3  . nicotine (NICODERM CQ - DOSED IN MG/24 HOURS) 21 mg/24hr patch EQUATE BRAND 21 mg daily for 6 weeks, 14 mg daily for 2 weeks, 7 mg daily for 2 weeks and then stop      . oxyCODONE (OXY IR/ROXICODONE) 5 MG immediate release tablet Take 1-2 tablets (5-10 mg total) by mouth every 6 (six) hours as needed for severe pain.  40 tablet  0   No current facility-administered medications for this visit.    Physical Exam BP 127/82  Pulse 75  Resp 20  Ht 6' (1.829 m)  Wt 167 lb (75.751 kg)  BMI 22.64 kg/m2  SpO85 33% 62 year old male in no acute distress Neurologic alert and  oriented x3 with no focal deficits Cardiac regular rate and rhythm normal S1 and S2 no rubs Sternum stable, incision clean dry and intact Leg incisions healing well no peripheral edema Lungs clear with equal breath sounds bilaterally   Diagnostic Tests: Chest x-ray shows good aeration lungs bilaterally. There is COPD. No effusions or infiltrates.  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 He has had coronary bypass grafting x4 for left main and three-vessel coronary disease on November 14. He is now about a month out from surgery. Overall he is doing well. He is still having some incisional pain. This is not unexpected. I gave him a prescription for an additional 40 oxycodone tablets 1-2 tablets 3 times daily as needed for pain.  He may begin driving a limited basis. He is not to drive while taking narcotics. He should limit himself to short trypsin around town and avoid heavy traffic and high speeds for the next month. He is not to lift anything over 10 pounds until the first of the year. After that he may begin to gradually increase.  He has cutback dramatically smoking but has not been able to quit altogether. He is requesting Chantix. I gave him a prescription for Chantix. Hopefully that will help quit altogether.  He should  stay out of work a minimum of 2 additional months. We set a tentative return to work date of February 14th.  Plan: He'll continue to be followed by Dr. Melene Muller.  I will be happy to see him back any time if I can be of any further assistance with his care in the future.

## 2013-11-30 ENCOUNTER — Encounter (HOSPITAL_COMMUNITY)
Admission: RE | Admit: 2013-11-30 | Discharge: 2013-11-30 | Disposition: A | Discharge status: Home / Self Care | Payer: 59 | Source: Ambulatory Visit | Attending: Cardiovascular Disease | Admitting: Cardiovascular Disease

## 2013-11-30 NOTE — Progress Notes (Signed)
Pt started cardiac rehab today.  Pt tolerated light exercise without difficulty. Telemetry rhythm Sinus. Vital signs stable. Will continue to monitor the patient throughout  the program.  

## 2013-12-02 ENCOUNTER — Encounter (HOSPITAL_COMMUNITY)
Admission: RE | Admit: 2013-12-02 | Discharge: 2013-12-02 | Disposition: A | Discharge status: Home / Self Care | Payer: 59 | Source: Ambulatory Visit | Attending: Cardiovascular Disease | Admitting: Cardiovascular Disease

## 2013-12-02 NOTE — Progress Notes (Signed)
Lonnie [name]  Was noted to have frequent bigeminal PVC's on the treadmill today at cardiac rehab.  Exercise stopped. Lonnie [name] was asymptomatic and stated he hadn't eaten since 0730 am.  Blood pressure 118/72.  Patient given an orange and some juice.  Lonnie [name] ANP called and notified. No  New orders received. Lonnie [name] ANP said that Lonnie [name] may return to exercise on Monday. Will fax exercise flow sheets to Dr. Gibson Ramp  office for review.

## 2013-12-05 ENCOUNTER — Encounter (HOSPITAL_COMMUNITY)
Admission: RE | Admit: 2013-12-05 | Discharge: 2013-12-05 | Disposition: A | Discharge status: Home / Self Care | Payer: 59 | Source: Ambulatory Visit | Attending: Cardiovascular Disease | Admitting: Cardiovascular Disease

## 2013-12-05 ENCOUNTER — Telehealth: Payer: Self-pay | Admitting: Cardiovascular Disease

## 2013-12-05 NOTE — Telephone Encounter (Signed)
Walk in pt Form " [employer] Sick Verification Form "  Gave to [facility] A.

## 2013-12-07 ENCOUNTER — Telehealth: Payer: Self-pay | Admitting: Cardiovascular Disease

## 2013-12-07 ENCOUNTER — Encounter (HOSPITAL_COMMUNITY)
Admission: RE | Admit: 2013-12-07 | Discharge: 2013-12-07 | Disposition: A | Discharge status: Home / Self Care | Payer: 59 | Source: Ambulatory Visit | Attending: Cardiovascular Disease | Admitting: Cardiovascular Disease

## 2013-12-07 DIAGNOSIS — I493 Ventricular premature depolarization: Secondary | ICD-10-CM

## 2013-12-07 MED ORDER — METOPROLOL TARTRATE 25 MG PO TABS: 25.0000 mg | ORAL_TABLET | Freq: Two times a day (BID) | ORAL | Status: DC

## 2013-12-07 NOTE — Telephone Encounter (Signed)
Pat, See strips from cardiac rehab. Pt with PvCs during exercise. Can we increase his Lopressor to 25 mg po BID. Thanks, chris

## 2013-12-07 NOTE — Progress Notes (Signed)
Patient noted to have a multifocal triplet.  Vital signs stable. Will fax exercise flow sheets to Dr. Gibson Ramp  office for review with today's ECG tracings.

## 2013-12-07 NOTE — Telephone Encounter (Signed)
Spoke with pt and gave him instructions from Dr. Clifton James. Will send prescription for increased lopressor dose to [facility] per pt's request. Pt also report he started Chantix but this made him feel funny.  He is unable to describe symptoms just states he felt funny. He has since stopped Chantix and is now feeling fine.

## 2013-12-09 ENCOUNTER — Encounter (HOSPITAL_COMMUNITY)
Admission: RE | Admit: 2013-12-09 | Discharge: 2013-12-09 | Disposition: A | Discharge status: Home / Self Care | Payer: 59 | Source: Ambulatory Visit | Attending: Cardiovascular Disease | Admitting: Cardiovascular Disease

## 2013-12-09 ENCOUNTER — Telehealth: Payer: Self-pay | Admitting: Cardiovascular Disease

## 2013-12-09 NOTE — Telephone Encounter (Signed)
Pt aware [employer] Verification Form ready For Pick up

## 2013-12-12 ENCOUNTER — Encounter (HOSPITAL_COMMUNITY)
Admission: RE | Admit: 2013-12-12 | Discharge: 2013-12-12 | Disposition: A | Discharge status: Home / Self Care | Payer: 59 | Source: Ambulatory Visit | Attending: Cardiovascular Disease | Admitting: Cardiovascular Disease

## 2013-12-12 NOTE — Progress Notes (Signed)
Reviewed home exercise with pt today.  Pt plans to walk, use bike, cross-trainer, and rower at home for exercise.  Also, pt is interested in returning to [employer] for exercise as well. Reviewed THR, pulse, RPE, sign and symptoms, and when to call 911 or MD.  Pt voiced understanding. Fabio Pierce, MA, ACSM RCEP

## 2013-12-13 ENCOUNTER — Other Ambulatory Visit (HOSPITAL_COMMUNITY): Payer: 59

## 2013-12-14 ENCOUNTER — Encounter (HOSPITAL_COMMUNITY): Payer: 59

## 2013-12-19 ENCOUNTER — Encounter (HOSPITAL_COMMUNITY)
Admission: RE | Admit: 2013-12-19 | Discharge: 2013-12-19 | Disposition: A | Discharge status: Home / Self Care | Payer: 59 | Source: Ambulatory Visit | Attending: Cardiovascular Disease | Admitting: Cardiovascular Disease

## 2013-12-20 ENCOUNTER — Ambulatory Visit (HOSPITAL_COMMUNITY): Payer: 59 | Attending: Cardiovascular Disease | Admitting: Radiology

## 2013-12-20 ENCOUNTER — Encounter: Payer: Self-pay | Admitting: Cardiovascular Disease

## 2013-12-20 DIAGNOSIS — I2589 Other forms of chronic ischemic heart disease: Secondary | ICD-10-CM | POA: Insufficient documentation

## 2013-12-20 DIAGNOSIS — E785 Hyperlipidemia, unspecified: Secondary | ICD-10-CM | POA: Insufficient documentation

## 2013-12-20 DIAGNOSIS — I10 Essential (primary) hypertension: Secondary | ICD-10-CM | POA: Insufficient documentation

## 2013-12-20 DIAGNOSIS — I25810 Atherosclerosis of coronary artery bypass graft(s) without angina pectoris: Secondary | ICD-10-CM | POA: Insufficient documentation

## 2013-12-20 DIAGNOSIS — I2510 Atherosclerotic heart disease of native coronary artery without angina pectoris: Secondary | ICD-10-CM

## 2013-12-20 DIAGNOSIS — I079 Rheumatic tricuspid valve disease, unspecified: Secondary | ICD-10-CM | POA: Insufficient documentation

## 2013-12-20 DIAGNOSIS — F17200 Nicotine dependence, unspecified, uncomplicated: Secondary | ICD-10-CM | POA: Insufficient documentation

## 2013-12-20 DIAGNOSIS — I059 Rheumatic mitral valve disease, unspecified: Secondary | ICD-10-CM | POA: Insufficient documentation

## 2013-12-20 DIAGNOSIS — I255 Ischemic cardiomyopathy: Secondary | ICD-10-CM

## 2013-12-20 NOTE — Progress Notes (Signed)
Echocardiogram performed.  

## 2013-12-21 ENCOUNTER — Encounter (HOSPITAL_COMMUNITY)
Admission: RE | Admit: 2013-12-21 | Discharge: 2013-12-21 | Disposition: A | Discharge status: Home / Self Care | Payer: 59 | Source: Ambulatory Visit | Attending: Cardiovascular Disease | Admitting: Cardiovascular Disease

## 2013-12-23 ENCOUNTER — Encounter (HOSPITAL_COMMUNITY)
Admission: RE | Admit: 2013-12-23 | Discharge: 2013-12-23 | Disposition: A | Discharge status: Home / Self Care | Payer: 59 | Source: Ambulatory Visit | Attending: Cardiovascular Disease | Admitting: Cardiovascular Disease

## 2013-12-23 DIAGNOSIS — I2510 Atherosclerotic heart disease of native coronary artery without angina pectoris: Secondary | ICD-10-CM | POA: Insufficient documentation

## 2013-12-23 DIAGNOSIS — Z7982 Long term (current) use of aspirin: Secondary | ICD-10-CM | POA: Insufficient documentation

## 2013-12-23 DIAGNOSIS — F17200 Nicotine dependence, unspecified, uncomplicated: Secondary | ICD-10-CM | POA: Insufficient documentation

## 2013-12-23 DIAGNOSIS — I10 Essential (primary) hypertension: Secondary | ICD-10-CM | POA: Insufficient documentation

## 2013-12-23 DIAGNOSIS — Z5189 Encounter for other specified aftercare: Secondary | ICD-10-CM | POA: Insufficient documentation

## 2013-12-23 DIAGNOSIS — I2109 ST elevation (STEMI) myocardial infarction involving other coronary artery of anterior wall: Secondary | ICD-10-CM | POA: Insufficient documentation

## 2013-12-23 DIAGNOSIS — Z79899 Other long term (current) drug therapy: Secondary | ICD-10-CM | POA: Insufficient documentation

## 2013-12-26 ENCOUNTER — Encounter (HOSPITAL_COMMUNITY)
Admission: RE | Admit: 2013-12-26 | Discharge: 2013-12-26 | Disposition: A | Discharge status: Home / Self Care | Payer: 59 | Source: Ambulatory Visit | Attending: Cardiovascular Disease | Admitting: Cardiovascular Disease

## 2013-12-26 ENCOUNTER — Other Ambulatory Visit: Payer: Self-pay

## 2013-12-26 MED ORDER — LISINOPRIL-HYDROCHLOROTHIAZIDE 10-12.5 MG PO TABS: 1.0000 | ORAL_TABLET | Freq: Every morning | ORAL | Status: DC

## 2013-12-26 MED ORDER — ATORVASTATIN CALCIUM 80 MG PO TABS: 80.0000 mg | ORAL_TABLET | Freq: Every day | ORAL | Status: DC

## 2013-12-28 ENCOUNTER — Ambulatory Visit (INDEPENDENT_AMBULATORY_CARE_PROVIDER_SITE_OTHER): Payer: 59 | Admitting: *Deleted

## 2013-12-28 ENCOUNTER — Encounter: Payer: 59 | Admitting: Cardiovascular Disease

## 2013-12-28 ENCOUNTER — Encounter (HOSPITAL_COMMUNITY)
Admission: RE | Admit: 2013-12-28 | Discharge: 2013-12-28 | Disposition: A | Discharge status: Home / Self Care | Payer: 59 | Source: Ambulatory Visit | Attending: Cardiovascular Disease | Admitting: Cardiovascular Disease

## 2013-12-28 ENCOUNTER — Encounter: Payer: Self-pay | Admitting: Cardiothoracic Surgery

## 2013-12-28 VITALS — Ht 72.0 in | Wt 167.0 lb

## 2013-12-28 DIAGNOSIS — Z4802 Encounter for removal of sutures: Secondary | ICD-10-CM

## 2013-12-28 DIAGNOSIS — Z951 Presence of aortocoronary bypass graft: Secondary | ICD-10-CM

## 2013-12-28 DIAGNOSIS — I2510 Atherosclerotic heart disease of native coronary artery without angina pectoris: Secondary | ICD-10-CM

## 2013-12-28 NOTE — Progress Notes (Addendum)
Lonnie [name] has an open wound on his vein harvest site it is about one  In a half diameter. Jolene, Dr Hendrickson's nurse called and notified. Lonnie [name] will go to Dr Hendrickson's office for evaluation. Lonnie [name] will not exercise today. Lonnie [name] denies and pain yellow exudate present.

## 2013-12-28 NOTE — Progress Notes (Unsigned)
Byrd HesselbachMaria called from cardiac rehab with concerns regarding Mr. Stradling's previous right leg endovein harvest site. After the scab had come off there appeared to be a nonhealing area.  On examination, there is a superficial area of nonhealing. I cleansed the area with peroxide and saline.  There is no sign of infection.  There is a good granulation base after the cleaning.  I instructed him in proper wound care.  He will call if this area does not heal.

## 2013-12-30 ENCOUNTER — Encounter (HOSPITAL_COMMUNITY)
Admission: RE | Admit: 2013-12-30 | Discharge: 2013-12-30 | Disposition: A | Discharge status: Home / Self Care | Payer: 59 | Source: Ambulatory Visit | Attending: Cardiovascular Disease | Admitting: Cardiovascular Disease

## 2014-01-02 ENCOUNTER — Encounter (HOSPITAL_COMMUNITY)
Admission: RE | Admit: 2014-01-02 | Discharge: 2014-01-02 | Disposition: A | Discharge status: Home / Self Care | Payer: 59 | Source: Ambulatory Visit | Attending: Cardiovascular Disease | Admitting: Cardiovascular Disease

## 2014-01-04 ENCOUNTER — Encounter (HOSPITAL_COMMUNITY)
Admission: RE | Admit: 2014-01-04 | Discharge: 2014-01-04 | Disposition: A | Discharge status: Home / Self Care | Payer: 59 | Source: Ambulatory Visit | Attending: Cardiovascular Disease | Admitting: Cardiovascular Disease

## 2014-01-04 NOTE — Progress Notes (Signed)
Upon inspection of Spiros's left  endoscopic vein harvest site remains open and appears to be non healing. Area cleaned. A clean band aid applied. Dr Hendrickson's called and notified, [facility]  Dr Tyrone SageGerhardt will evaluate Mr Clendenning tomorrow at 0930. Peyton NajjarLarry denies any pain around the area.

## 2014-01-05 ENCOUNTER — Ambulatory Visit (INDEPENDENT_AMBULATORY_CARE_PROVIDER_SITE_OTHER): Payer: 59 | Admitting: *Deleted

## 2014-01-05 DIAGNOSIS — Z5189 Encounter for other specified aftercare: Secondary | ICD-10-CM

## 2014-01-05 DIAGNOSIS — Z951 Presence of aortocoronary bypass graft: Secondary | ICD-10-CM

## 2014-01-05 DIAGNOSIS — I2510 Atherosclerotic heart disease of native coronary artery without angina pectoris: Secondary | ICD-10-CM

## 2014-01-05 NOTE — Progress Notes (Signed)
He presents to the office today for a recheck of a small slow healing area which was an aborted lower right leg endo vein harvest site.  Since his last visit there has not been much change.  It remains very superficial with no signs of infection.  He has been keeping it covered most of the time.  I suggested he leave it open to air to see if this would speed the healing process.  He will notify us if there are any changes.

## 2014-01-06 ENCOUNTER — Encounter (HOSPITAL_COMMUNITY)
Admission: RE | Admit: 2014-01-06 | Discharge: 2014-01-06 | Disposition: A | Discharge status: Home / Self Care | Payer: 59 | Source: Ambulatory Visit | Attending: Cardiovascular Disease | Admitting: Cardiovascular Disease

## 2014-01-06 NOTE — Progress Notes (Signed)
Erroneous encounter

## 2014-01-09 ENCOUNTER — Encounter (HOSPITAL_COMMUNITY)
Admission: RE | Admit: 2014-01-09 | Discharge: 2014-01-09 | Disposition: A | Discharge status: Home / Self Care | Payer: 59 | Source: Ambulatory Visit | Attending: Cardiovascular Disease | Admitting: Cardiovascular Disease

## 2014-01-09 NOTE — Progress Notes (Signed)
Lonnie [name] 63 y.o. male Nutrition Note Spoke with pt. Nutrition Plan and Nutrition Survey goals reviewed with pt. Pt is following Step 2 of the Therapeutic Lifestyle Changes diet. Pt wants to gain wt. Per pt, his pre-surgery UBW was "180 lbs." Pt wt today 78.0 kg, which is up 2 kg over the past month. Pt states his appetite has improved since starting rehab. Pt reports he continues use tobacco products and is working toward tobacco cessation. Pt c/o constipation, which is resolved by taking a stool softener. Pt states he drinks "2 big glasses of water" before and after each meal. Pt feels his water consumption has drastically decreased since his heart event, which may be effecting his bowel regularity. Pt expressed understanding of the information reviewed. Pt aware of nutrition education classes offered.  Nutrition Diagnosis   Food-and nutrition-related knowledge deficit related to lack of exposure to information as related to diagnosis of: ? CVD   Nutrition RX/ Re-Estimated Daily Nutrition Needs for: wt gain  2900-3400 Kcal, 80-95 gm fat, 19-23 gm sat fat, 2.9-3.4 gm trans-fat, <1500 mg sodium   Nutrition Intervention   Pt's individual nutrition plan including cholesterol goals reviewed with pt.   Benefits of adopting Therapeutic Lifestyle Changes discussed when Medficts reviewed.   Pt to attend the Portion Distortion class   Pt given handouts for: ? Nutrition I class ? Nutrition II class   Continue client-centered nutrition education by RD, as part of interdisciplinary care.  Goal(s)   Pt to identify food quantities necessary to achieve: ? wt gain to a goal wt of 173-180 lb (78.6-81.8 kg) at graduation from cardiac rehab.   Monitor and Evaluate progress toward nutrition goal with team. Nutrition Risk:  Low   Mickle PlumbEdna Paraskevi Funez, M.Ed, RD, LDN, CDE 01/09/2014 3:40 PM

## 2014-01-11 ENCOUNTER — Encounter (HOSPITAL_COMMUNITY)
Admission: RE | Admit: 2014-01-11 | Discharge: 2014-01-11 | Disposition: A | Discharge status: Home / Self Care | Payer: 59 | Source: Ambulatory Visit | Attending: Cardiovascular Disease | Admitting: Cardiovascular Disease

## 2014-01-13 ENCOUNTER — Encounter (HOSPITAL_COMMUNITY)
Admission: RE | Admit: 2014-01-13 | Discharge: 2014-01-13 | Disposition: A | Discharge status: Home / Self Care | Payer: 59 | Source: Ambulatory Visit | Attending: Cardiovascular Disease | Admitting: Cardiovascular Disease

## 2014-01-16 ENCOUNTER — Telehealth (HOSPITAL_COMMUNITY): Payer: Self-pay | Admitting: *Deleted

## 2014-01-16 ENCOUNTER — Encounter (HOSPITAL_COMMUNITY)
Admission: RE | Admit: 2014-01-16 | Discharge: 2014-01-16 | Disposition: A | Discharge status: Home / Self Care | Payer: 59 | Source: Ambulatory Visit | Attending: Cardiovascular Disease | Admitting: Cardiovascular Disease

## 2014-01-16 ENCOUNTER — Encounter (HOSPITAL_COMMUNITY): Payer: 59

## 2014-01-16 NOTE — Progress Notes (Signed)
Upon inspection Lonnie [name]'s vein harvest site looks better and has a dry scab now. Will continue to monitor the patient throughout  the program.

## 2014-01-18 ENCOUNTER — Encounter (HOSPITAL_COMMUNITY)
Admission: RE | Admit: 2014-01-18 | Discharge: 2014-01-18 | Disposition: A | Discharge status: Home / Self Care | Payer: 59 | Source: Ambulatory Visit | Attending: Cardiovascular Disease | Admitting: Cardiovascular Disease

## 2014-01-20 ENCOUNTER — Encounter (HOSPITAL_COMMUNITY)
Admission: RE | Admit: 2014-01-20 | Discharge: 2014-01-20 | Disposition: A | Discharge status: Home / Self Care | Payer: 59 | Source: Ambulatory Visit | Attending: Cardiovascular Disease | Admitting: Cardiovascular Disease

## 2014-01-23 ENCOUNTER — Encounter (HOSPITAL_COMMUNITY)
Admission: RE | Admit: 2014-01-23 | Discharge: 2014-01-23 | Disposition: A | Discharge status: Home / Self Care | Payer: 59 | Source: Ambulatory Visit | Attending: Cardiovascular Disease | Admitting: Cardiovascular Disease

## 2014-01-23 DIAGNOSIS — I10 Essential (primary) hypertension: Secondary | ICD-10-CM | POA: Insufficient documentation

## 2014-01-23 DIAGNOSIS — F17200 Nicotine dependence, unspecified, uncomplicated: Secondary | ICD-10-CM | POA: Insufficient documentation

## 2014-01-23 DIAGNOSIS — Z7982 Long term (current) use of aspirin: Secondary | ICD-10-CM | POA: Insufficient documentation

## 2014-01-23 DIAGNOSIS — I2109 ST elevation (STEMI) myocardial infarction involving other coronary artery of anterior wall: Secondary | ICD-10-CM | POA: Insufficient documentation

## 2014-01-23 DIAGNOSIS — Z79899 Other long term (current) drug therapy: Secondary | ICD-10-CM | POA: Insufficient documentation

## 2014-01-23 DIAGNOSIS — Z5189 Encounter for other specified aftercare: Secondary | ICD-10-CM | POA: Insufficient documentation

## 2014-01-23 DIAGNOSIS — I2510 Atherosclerotic heart disease of native coronary artery without angina pectoris: Secondary | ICD-10-CM | POA: Insufficient documentation

## 2014-01-25 ENCOUNTER — Encounter (HOSPITAL_COMMUNITY)
Admission: RE | Admit: 2014-01-25 | Discharge: 2014-01-25 | Disposition: A | Discharge status: Home / Self Care | Payer: 59 | Source: Ambulatory Visit | Attending: Cardiovascular Disease | Admitting: Cardiovascular Disease

## 2014-01-27 ENCOUNTER — Encounter (HOSPITAL_COMMUNITY)
Admission: RE | Admit: 2014-01-27 | Discharge: 2014-01-27 | Disposition: A | Discharge status: Home / Self Care | Payer: 59 | Source: Ambulatory Visit | Attending: Cardiovascular Disease | Admitting: Cardiovascular Disease

## 2014-01-30 ENCOUNTER — Encounter (HOSPITAL_COMMUNITY)
Admission: RE | Admit: 2014-01-30 | Discharge: 2014-01-30 | Disposition: A | Discharge status: Home / Self Care | Payer: 59 | Source: Ambulatory Visit | Attending: Cardiovascular Disease | Admitting: Cardiovascular Disease

## 2014-01-31 NOTE — Progress Notes (Signed)
Intermittent exertional blood pressure elevations noted at cardiac rehab. Will fax exercise flow sheets to Dr. Gibson RampMcAlhany's office for review. Peyton NajjarLarry continues to smoke cigarettes.

## 2014-02-01 ENCOUNTER — Encounter (HOSPITAL_COMMUNITY)
Admission: RE | Admit: 2014-02-01 | Discharge: 2014-02-01 | Disposition: A | Discharge status: Home / Self Care | Payer: 59 | Source: Ambulatory Visit | Attending: Cardiovascular Disease | Admitting: Cardiovascular Disease

## 2014-02-03 ENCOUNTER — Encounter: Payer: Self-pay | Admitting: Cardiovascular Disease

## 2014-02-03 ENCOUNTER — Telehealth: Payer: Self-pay | Admitting: Cardiovascular Disease

## 2014-02-03 ENCOUNTER — Encounter (HOSPITAL_COMMUNITY): Payer: 59

## 2014-02-03 NOTE — Telephone Encounter (Signed)
Walk In pt Form " [employer]/Disability Claim Form " gave to [facility]

## 2014-02-03 NOTE — Telephone Encounter (Signed)
[employer] Disability Claim Form Signed and Completed, LMOVM For Pt

## 2014-02-06 ENCOUNTER — Other Ambulatory Visit: Payer: Self-pay | Admitting: *Deleted

## 2014-02-06 ENCOUNTER — Encounter (HOSPITAL_COMMUNITY)
Admission: RE | Admit: 2014-02-06 | Discharge: 2014-02-06 | Disposition: A | Discharge status: Home / Self Care | Payer: 59 | Source: Ambulatory Visit | Attending: Cardiovascular Disease | Admitting: Cardiovascular Disease

## 2014-02-06 MED ORDER — ATORVASTATIN CALCIUM 80 MG PO TABS: 80.0000 mg | ORAL_TABLET | Freq: Every day | ORAL | Status: DC

## 2014-02-06 NOTE — Progress Notes (Signed)
Peyton NajjarLarry reports feeling a burning in the back of both of his calves at about 4 minutes into walking on the treadmill.  Peyton NajjarLarry reports this has been going on a daily basis since he began exercise at cardiac rehab. Upon assessment bilateral pulses palpable at 1+. Will notify Dr Gibson RampMcAlhany's office of Mr Nordhoff's complaints.

## 2014-02-08 ENCOUNTER — Encounter (HOSPITAL_COMMUNITY)
Admission: RE | Admit: 2014-02-08 | Discharge: 2014-02-08 | Disposition: A | Discharge status: Home / Self Care | Payer: 59 | Source: Ambulatory Visit | Attending: Cardiovascular Disease | Admitting: Cardiovascular Disease

## 2014-02-10 ENCOUNTER — Encounter (HOSPITAL_COMMUNITY)
Admission: RE | Admit: 2014-02-10 | Discharge: 2014-02-10 | Disposition: A | Discharge status: Home / Self Care | Payer: 59 | Source: Ambulatory Visit | Attending: Cardiovascular Disease | Admitting: Cardiovascular Disease

## 2014-02-13 ENCOUNTER — Encounter (HOSPITAL_COMMUNITY)
Admission: RE | Admit: 2014-02-13 | Discharge: 2014-02-13 | Disposition: A | Discharge status: Home / Self Care | Payer: 59 | Source: Ambulatory Visit | Attending: Cardiovascular Disease | Admitting: Cardiovascular Disease

## 2014-02-15 ENCOUNTER — Encounter (HOSPITAL_COMMUNITY)
Admission: RE | Admit: 2014-02-15 | Discharge: 2014-02-15 | Disposition: A | Discharge status: Home / Self Care | Payer: 59 | Source: Ambulatory Visit | Attending: Cardiovascular Disease | Admitting: Cardiovascular Disease

## 2014-02-17 ENCOUNTER — Encounter (HOSPITAL_COMMUNITY): Payer: 59

## 2014-02-17 ENCOUNTER — Telehealth: Payer: Self-pay | Admitting: Cardiovascular Disease

## 2014-02-17 NOTE — Telephone Encounter (Signed)
New Problem:  Pt is requesting a call back regarding [employer] Nurse, mental health- [employer] A[employer] Form.Marland Kitchen..Marland Kitchen

## 2014-02-17 NOTE — Telephone Encounter (Signed)
Pt brought paperwork to office this afternoon. Completed by [name] and original given to pt.  Copy sent to medical records.

## 2014-02-17 NOTE — Telephone Encounter (Signed)
Spoke with pt's wife regarding paperwork for [employer]. Paperwork was completed with comment that return to work date would be evaluated at office visit on 02/28/14. Wife is asking if this can be extended until May prior to appointment. Wife states pt does not feel he can return to work prior to May. 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  Pt will drop these forms off in office today for [name] to complete.

## 2014-02-17 NOTE — Telephone Encounter (Signed)
ok 

## 2014-02-20 ENCOUNTER — Encounter (HOSPITAL_COMMUNITY): Payer: 59

## 2014-02-22 ENCOUNTER — Encounter (HOSPITAL_COMMUNITY)
Admission: RE | Admit: 2014-02-22 | Discharge: 2014-02-22 | Disposition: A | Discharge status: Home / Self Care | Payer: 59 | Source: Ambulatory Visit | Attending: Cardiovascular Disease | Admitting: Cardiovascular Disease

## 2014-02-22 DIAGNOSIS — Z7982 Long term (current) use of aspirin: Secondary | ICD-10-CM | POA: Insufficient documentation

## 2014-02-22 DIAGNOSIS — Z5189 Encounter for other specified aftercare: Secondary | ICD-10-CM | POA: Insufficient documentation

## 2014-02-22 DIAGNOSIS — I10 Essential (primary) hypertension: Secondary | ICD-10-CM | POA: Insufficient documentation

## 2014-02-22 DIAGNOSIS — F17200 Nicotine dependence, unspecified, uncomplicated: Secondary | ICD-10-CM | POA: Insufficient documentation

## 2014-02-22 DIAGNOSIS — I2109 ST elevation (STEMI) myocardial infarction involving other coronary artery of anterior wall: Secondary | ICD-10-CM | POA: Insufficient documentation

## 2014-02-22 DIAGNOSIS — I2510 Atherosclerotic heart disease of native coronary artery without angina pectoris: Secondary | ICD-10-CM | POA: Insufficient documentation

## 2014-02-22 DIAGNOSIS — Z79899 Other long term (current) drug therapy: Secondary | ICD-10-CM | POA: Insufficient documentation

## 2014-02-24 ENCOUNTER — Encounter (HOSPITAL_COMMUNITY)
Admission: RE | Admit: 2014-02-24 | Discharge: 2014-02-24 | Disposition: A | Discharge status: Home / Self Care | Payer: 59 | Source: Ambulatory Visit | Attending: Cardiovascular Disease | Admitting: Cardiovascular Disease

## 2014-02-27 ENCOUNTER — Encounter (HOSPITAL_COMMUNITY)
Admission: RE | Admit: 2014-02-27 | Discharge: 2014-02-27 | Disposition: A | Discharge status: Home / Self Care | Payer: 59 | Source: Ambulatory Visit | Attending: Cardiovascular Disease | Admitting: Cardiovascular Disease

## 2014-02-28 ENCOUNTER — Ambulatory Visit (INDEPENDENT_AMBULATORY_CARE_PROVIDER_SITE_OTHER): Payer: 59 | Admitting: Cardiovascular Disease

## 2014-02-28 ENCOUNTER — Encounter: Payer: Self-pay | Admitting: Cardiovascular Disease

## 2014-02-28 VITALS — BP 128/82 | HR 58 | Ht 72.0 in | Wt 175.0 lb

## 2014-02-28 DIAGNOSIS — M79609 Pain in unspecified limb: Secondary | ICD-10-CM

## 2014-02-28 DIAGNOSIS — I255 Ischemic cardiomyopathy: Secondary | ICD-10-CM

## 2014-02-28 DIAGNOSIS — I10 Essential (primary) hypertension: Secondary | ICD-10-CM

## 2014-02-28 DIAGNOSIS — F17200 Nicotine dependence, unspecified, uncomplicated: Secondary | ICD-10-CM

## 2014-02-28 DIAGNOSIS — M79606 Pain in leg, unspecified: Secondary | ICD-10-CM

## 2014-02-28 DIAGNOSIS — Z720 Tobacco use: Secondary | ICD-10-CM

## 2014-02-28 DIAGNOSIS — I2510 Atherosclerotic heart disease of native coronary artery without angina pectoris: Secondary | ICD-10-CM

## 2014-02-28 DIAGNOSIS — I2589 Other forms of chronic ischemic heart disease: Secondary | ICD-10-CM

## 2014-02-28 MED ORDER — NITROGLYCERIN 0.4 MG SL SUBL: 0.4000 mg | SUBLINGUAL_TABLET | SUBLINGUAL | Status: DC | PRN

## 2014-02-28 NOTE — Patient Instructions (Signed)
Your physician wants you to follow-up in:  4 months. You will receive a reminder letter in the mail two months in advance. If you don't receive a letter, please call our office to schedule the follow-up appointment.   Your physician has requested that you have a lower extremity arterial exercise duplex. During this test, exercise and ultrasound are used to evaluate arterial blood flow in the legs. Allow one hour for this exam. There are no restrictions or special instructions.  Your physician recommends that you return for fasting lab work on day of doppler studies

## 2014-02-28 NOTE — Progress Notes (Signed)
History of Present Illness: 63 yo male with history of tobacco abuse, HTN and CAD who is here today for cardiac follow up. He was admitted to [facility] 10/29/13 with anterior STEMI and found to have moderately severe left main and LAD stenosis, moderately severe RCA stenosis. I did not initially see a culprit vessel. He continued to have angina and repeat cath on 10/31/13 with IVUS of the left main artery showed significant disease. He underwent 4V CABG on 11/04/13 per Dr. Dorris FetchHendrickson (LIMA to mid LAD, SVG to OM, SVG to PDA, SVG to Diagonal). His post-operative course was uneventful. LVEF=40-45% prior to bypass.   He is here today for follow up. He has been doing well. No chest pain or SOB. No palpitations. He has been in cardiac rehab and doing well. Still smoking 2 cigarettes per day. Both calves cramping at 4 minutes of exercise and resolved by end of exercise regimen.   Primary Care Physician: Dr. Earlene Plateravis ([facility])  Last Lipid Profile:Lipid Panel     Component Value Date/Time   CHOL 152 10/30/2013 0420   TRIG 76 10/30/2013 0420   HDL 43 10/30/2013 0420   CHOLHDL 3.5 10/30/2013 0420   VLDL 15 10/30/2013 0420   LDLCALC 94 10/30/2013 0420     Past Medical History  Diagnosis Date  . Hypertension   . Tobacco abuse   . Anginal pain   . Ischemic cardiomyopathy     Past Surgical History  Procedure Laterality Date  . Toe surgery    . Appendectomy    . Knee surgery      fractured patella  . Mouth surgery    . Coronary artery bypass graft N/A 11/04/2013    Procedure: CORONARY ARTERY BYPASS GRAFTING (CABG) TIMES FOUR  USING LEFT INTERNAL MAMMARY ARTERY AND RIGHT AND LEFT SAPHENOUS LEG VEIN HARVESTED ENDOSCOPICALLY;  Surgeon: Loreli SlotSteven C Hendrickson, MD;  [facility];  Service: Open Heart Surgery;  Laterality: N/A;    Current Outpatient Prescriptions  Medication Sig Dispense Refill  . ACAI PO Take 50 mg by mouth 4 (four) times daily.      Marland Kitchen. aspirin EC 325 MG EC  tablet Take 1 tablet (325 mg total) by mouth daily.  30 tablet  0  . atorvastatin (LIPITOR) 80 MG tablet Take 1 tablet (80 mg total) by mouth daily at 6 PM.  90 tablet  1  . Coenzyme Q10 (CO Q 10 PO) Take 200 mg by mouth.      Marland Kitchen. lisinopril-hydrochlorothiazide (PRINZIDE,ZESTORETIC) 10-12.5 MG per tablet Take 1 tablet by mouth every morning.  90 tablet  1  . metoprolol tartrate (LOPRESSOR) 25 MG tablet Take 1 tablet (25 mg total) by mouth 2 (two) times daily.  180 tablet  3  . Vitamins-Lipotropics (B-50 PO) Take by mouth daily.       No current facility-administered medications for this visit.    Allergies  Allergen Reactions  . Dairy Aid [Lactase]   . Eggs Or Egg-Derived Products   . Peanut-Containing Drug Products     Does not know which nuts, but states nuts make him vomit    History   Social History  . Marital Status: Married    Spouse Name: N/A    Number of Children: 1  . Years of Education: N/A   Occupational History  . 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 Smoking status: Current Every Day Smoker -- 1.00 packs/day for 30 years  Types: Cigarettes  . Smokeless tobacco: Not on file  . Alcohol Use: No  . Drug Use: No  . Sexual Activity: Yes   Other Topics Concern  . Not on file   Social History Narrative  . No narrative on file    Family History  Problem Relation Age of Onset  . CAD Father 35  . Cardiomyopathy Daughter     Review of Systems:  As stated in the HPI and otherwise negative.   BP 128/82  Pulse 58  Ht 6' (1.829 m)  Wt 175 lb (79.379 kg)  BMI 23.73 kg/m2  Physical Examination: General: Well developed, well nourished, NAD HEENT: OP clear, mucus membranes moist SKIN: warm, dry. No rashes. Neuro: No focal deficits Musculoskeletal: Muscle strength 5/5 all ext Psychiatric: Mood and affect normal Neck: No JVD, no carotid bruits, no thyromegaly, no lymphadenopathy. Lungs:Clear bilaterally, no wheezes, rhonci,  crackles Cardiovascular: Regular rate and rhythm. No murmurs, gallops or rubs. Abdomen:Soft. Bowel sounds present. Non-tender.  Extremities: No lower extremity edema. Pulses are 2 + in the right DP/PT and trace left DP/PT.   Echo 12/20/13: Left ventricle: Diffuse hypokinesis with abnormal septal motion EF somewhat hard to judge as patient had frequent bigemminy The cavity size was moderately dilated. Wall thickness was increased in a pattern of mild LVH. Systolic function was moderately reduced. The estimated ejection fraction was in the range of 35% to 40%. - Left atrium: The atrium was mildly dilated. - Atrial septum: No defect or patent foramen ovale was identified. - Pulmonary arteries: PA peak pressure: 31mm Hg (S).  Assessment and Plan:   1. CAD: Stable s/p CABG November 2014. . Doing well. Continue ASA, statin, beta blocker, Ace-inh. He is on short term disability with [employer]. He is doing well in cardiac rehab.   2. Ischemic Cardiomyopathy: Echo 12/20/13 which was 6 weeks post CABG shows LVEF=35-40%. Will continue current meds.   3. Tobacco abuse: Complete cessation advised.   4. HTN: BP well controlled. No changes.   5. Leg pain: Will check exercise ABI.

## 2014-03-01 ENCOUNTER — Encounter (HOSPITAL_COMMUNITY): Payer: 59

## 2014-03-03 ENCOUNTER — Encounter (HOSPITAL_COMMUNITY)
Admission: RE | Admit: 2014-03-03 | Discharge: 2014-03-03 | Disposition: A | Discharge status: Home / Self Care | Payer: 59 | Source: Ambulatory Visit | Attending: Cardiovascular Disease | Admitting: Cardiovascular Disease

## 2014-03-06 ENCOUNTER — Encounter (HOSPITAL_COMMUNITY)
Admission: RE | Admit: 2014-03-06 | Discharge: 2014-03-06 | Disposition: A | Discharge status: Home / Self Care | Payer: 59 | Source: Ambulatory Visit | Attending: Cardiovascular Disease | Admitting: Cardiovascular Disease

## 2014-03-08 ENCOUNTER — Encounter (HOSPITAL_COMMUNITY)
Admission: RE | Admit: 2014-03-08 | Discharge: 2014-03-08 | Disposition: A | Discharge status: Home / Self Care | Payer: 59 | Source: Ambulatory Visit | Attending: Cardiovascular Disease | Admitting: Cardiovascular Disease

## 2014-03-09 ENCOUNTER — Other Ambulatory Visit (INDEPENDENT_AMBULATORY_CARE_PROVIDER_SITE_OTHER): Payer: 59 | Admitting: *Deleted

## 2014-03-09 DIAGNOSIS — I2510 Atherosclerotic heart disease of native coronary artery without angina pectoris: Secondary | ICD-10-CM

## 2014-03-09 LAB — LIPID PANEL
Cholesterol: 99 mg/dL (ref 0–200)
HDL: 47.5 mg/dL (ref >39.00)
LDL Cholesterol: 42 mg/dL (ref 0–99)
Total CHOL/HDL Ratio: 2
Triglycerides: 47 mg/dL (ref 0.0–149.0)
VLDL: 9.4 mg/dL (ref 0.0–40.0)

## 2014-03-09 LAB — HEPATIC FUNCTION PANEL
ALT: 29 U/L (ref 0–53)
AST: 26 U/L (ref 0–37)
Albumin: 4.2 g/dL (ref 3.5–5.2)
Alkaline Phosphatase: 62 U/L (ref 39–117)
Bilirubin, Direct: 0 mg/dL (ref 0.0–0.3)
Total Bilirubin: 0.6 mg/dL (ref 0.3–1.2)
Total Protein: 7 g/dL (ref 6.0–8.3)

## 2014-03-10 ENCOUNTER — Encounter (HOSPITAL_COMMUNITY)
Admission: RE | Admit: 2014-03-10 | Discharge: 2014-03-10 | Disposition: A | Discharge status: Home / Self Care | Payer: 59 | Source: Ambulatory Visit | Attending: Cardiovascular Disease | Admitting: Cardiovascular Disease

## 2014-03-10 ENCOUNTER — Encounter (HOSPITAL_COMMUNITY): Payer: 59

## 2014-03-10 ENCOUNTER — Other Ambulatory Visit: Payer: 59

## 2014-03-10 NOTE — Progress Notes (Signed)
Lonnie NajjarLarry graduates today and plans to continue exercise on his own at [employer].

## 2014-03-13 ENCOUNTER — Encounter (HOSPITAL_COMMUNITY): Payer: 59

## 2014-03-15 ENCOUNTER — Encounter (HOSPITAL_COMMUNITY): Payer: 59

## 2014-03-17 ENCOUNTER — Encounter (HOSPITAL_COMMUNITY): Payer: 59

## 2014-03-20 ENCOUNTER — Other Ambulatory Visit (HOSPITAL_COMMUNITY): Payer: Self-pay | Admitting: Cardiology

## 2014-03-20 ENCOUNTER — Encounter (HOSPITAL_COMMUNITY): Payer: 59

## 2014-03-20 DIAGNOSIS — I739 Peripheral vascular disease, unspecified: Secondary | ICD-10-CM

## 2014-03-21 ENCOUNTER — Telehealth: Payer: Self-pay | Admitting: Cardiovascular Disease

## 2014-03-21 NOTE — Telephone Encounter (Signed)
Walk In pt Form " Sealed Envelope" Dropped Off gave to Pat/Dr.McAlhany

## 2014-03-22 ENCOUNTER — Encounter (HOSPITAL_COMMUNITY): Payer: 59

## 2014-03-23 ENCOUNTER — Encounter: Payer: Self-pay | Admitting: Cardiovascular Disease

## 2014-03-23 ENCOUNTER — Encounter (HOSPITAL_BASED_OUTPATIENT_CLINIC_OR_DEPARTMENT_OTHER): Payer: 59

## 2014-03-23 ENCOUNTER — Ambulatory Visit (HOSPITAL_COMMUNITY): Payer: 59 | Attending: Cardiovascular Disease | Admitting: Cardiology

## 2014-03-23 ENCOUNTER — Other Ambulatory Visit (HOSPITAL_COMMUNITY): Payer: Self-pay | Admitting: Cardiology

## 2014-03-23 DIAGNOSIS — I739 Peripheral vascular disease, unspecified: Secondary | ICD-10-CM

## 2014-03-23 DIAGNOSIS — I70219 Atherosclerosis of native arteries of extremities with intermittent claudication, unspecified extremity: Secondary | ICD-10-CM | POA: Insufficient documentation

## 2014-03-23 DIAGNOSIS — R0989 Other specified symptoms and signs involving the circulatory and respiratory systems: Secondary | ICD-10-CM

## 2014-03-23 NOTE — Progress Notes (Signed)
Lower Arterial Doppler performed; ABI's significantly abnormal, so per protocol performed lower arterial Duplex (NOT exercise ABI's)

## 2014-03-24 ENCOUNTER — Encounter (HOSPITAL_COMMUNITY): Payer: 59

## 2014-03-24 ENCOUNTER — Other Ambulatory Visit: Payer: Self-pay | Admitting: *Deleted

## 2014-03-27 ENCOUNTER — Encounter (HOSPITAL_COMMUNITY): Payer: 59

## 2014-03-29 ENCOUNTER — Encounter (HOSPITAL_COMMUNITY): Payer: 59

## 2014-03-31 ENCOUNTER — Encounter (HOSPITAL_COMMUNITY): Payer: 59

## 2014-04-04 ENCOUNTER — Other Ambulatory Visit: Payer: Self-pay | Admitting: Cardiovascular Disease

## 2014-04-04 ENCOUNTER — Telehealth: Payer: Self-pay | Admitting: Cardiovascular Disease

## 2014-04-04 NOTE — Telephone Encounter (Signed)
New problem    [name] called to say they are having some problems with the Third party in regards to medical records.  Patients wife needs a call back.

## 2014-04-04 NOTE — Telephone Encounter (Signed)
Spoke with pt's wife. She reports having problems with [employer] and pt's disability paperwork. She was not aware until recently fee was needed for paperwork to be completed.  She was concerned about delay in getting paperwork completed.  She has spoken with [name] in medical records and would like management from our office contact her to discuss.  Paperwork was received in our office this afternoon and has been completed by [name].  I told pt I would take completed paperwork to medical records and our office will contact her tomorrow when paperwork ready.   I  told her if she does not receive a call by 11 AM she should call our office to see if ready. Will leave paperwork on triage cart to be taken to medical records in the morning with message asking medical records to contact pt's wife when ready for pick up.

## 2014-04-05 NOTE — Telephone Encounter (Signed)
Pt Was called this am he is aware Paperwork has Been completed and [facility].

## 2014-04-07 ENCOUNTER — Ambulatory Visit (INDEPENDENT_AMBULATORY_CARE_PROVIDER_SITE_OTHER): Payer: 59 | Admitting: Cardiovascular Disease

## 2014-04-07 ENCOUNTER — Encounter: Payer: Self-pay | Admitting: Cardiovascular Disease

## 2014-04-07 VITALS — BP 124/77 | HR 51 | Ht 72.0 in | Wt 172.5 lb

## 2014-04-07 DIAGNOSIS — Z951 Presence of aortocoronary bypass graft: Secondary | ICD-10-CM

## 2014-04-07 DIAGNOSIS — I10 Essential (primary) hypertension: Secondary | ICD-10-CM

## 2014-04-07 DIAGNOSIS — I2510 Atherosclerotic heart disease of native coronary artery without angina pectoris: Secondary | ICD-10-CM

## 2014-04-07 MED ORDER — CILOSTAZOL 50 MG PO TABS: 50.0000 mg | ORAL_TABLET | Freq: Two times a day (BID) | ORAL | Status: DC

## 2014-04-07 NOTE — Patient Instructions (Signed)
Your physician has recommended you make the following change in your medication:  Start Pletal 50 mg twice daily   Your physician recommends that you schedule a follow-up appointment in:  3 months in our [place]

## 2014-04-07 NOTE — Progress Notes (Signed)
Primary cardiologist: [name]  History of Present Illness: This is a very pleasant 63 year old man who was referred for evaluation and management of peripheral arterial disease. He has known history of tobacco abuse, HTN and CAD . He was admitted to [facility] 10/29/13 with anterior STEMI and found to have moderately severe left main and LAD stenosis, moderately severe RCA stenosis. He underwent 4V CABG on 11/04/13 per Dr. Dorris FetchHendrickson (LIMA to mid LAD, SVG to OM, SVG to PDA, SVG to Diagonal). His post-operative course was uneventful. LVEF=40-45% prior to bypass.   He started cardiac rehabilitation after her and noticed bilateral calf claudication which started on the left side initially after about 4 minutes of exercise and then in the right side. He was able to continue his exercise and typically the discomfort resolved with continued exercise. He has been done with cardiac rehabilitation and has not noticed claudication with regular activities. Noninvasive evaluation showed an ABI of 0.78 on the right and 0.41 on the left with evidence of left distal SFA occlusion and significant disease in the right SFA. 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 He denies chest pain or dyspnea. He continues to smoke 5 cigarettes a day. He is not diaphoretic.   Primary Care Physician: Dr. Earlene Plateravis ([facility])  Last Lipid Profile:Lipid Panel     Component Value Date/Time   CHOL 99 03/09/2014 1401   TRIG 47.0 03/09/2014 1401   HDL 47.50 03/09/2014 1401   CHOLHDL 2 03/09/2014 1401   VLDL 9.4 03/09/2014 1401   LDLCALC 42 03/09/2014 1401     Past Medical History  Diagnosis Date  . Hypertension   . Tobacco abuse   . Anginal pain   . Ischemic cardiomyopathy   . CAD (coronary artery disease)   . MI (myocardial infarction)   . Clotting disorder     thighs    Past Surgical History  Procedure Laterality Date  . Toe surgery    . Appendectomy      . Knee surgery      fractured patella  . Mouth surgery    . Coronary artery bypass graft N/A 11/04/2013    Procedure: CORONARY ARTERY BYPASS GRAFTING (CABG) TIMES FOUR  USING LEFT INTERNAL MAMMARY ARTERY AND RIGHT AND LEFT SAPHENOUS LEG VEIN HARVESTED ENDOSCOPICALLY;  Surgeon: Loreli SlotSteven C Hendrickson, MD;  [facility];  Service: Open Heart Surgery;  Laterality: N/A;    Current Outpatient Prescriptions  Medication Sig Dispense Refill  . ACAI PO Take 50 mg by mouth 4 (four) times daily.      Marland Kitchen. aspirin EC 325 MG EC tablet Take 1 tablet (325 mg total) by mouth daily.  30 tablet  0  . atorvastatin (LIPITOR) 80 MG tablet Take 1 tablet (80 mg total) by mouth daily at 6 PM.  90 tablet  1  . Coenzyme Q10 (CO Q 10 PO) Take 200 mg by mouth.      . docusate sodium (COLACE) 250 MG capsule Take 250 mg by mouth daily.      Marland Kitchen. lisinopril-hydrochlorothiazide (PRINZIDE,ZESTORETIC) 10-12.5 MG per tablet Take 1 tablet by mouth every morning.  90 tablet  1  . metoprolol tartrate (LOPRESSOR) 25 MG tablet Take 1 tablet (25 mg total) by mouth 2 (two) times daily.  180 tablet  3  . nitroGLYCERIN (NITROSTAT) 0.4 MG SL tablet Place 1 tablet (0.4 mg total) under the tongue every 5 (five) minutes as  needed for chest pain.  25 tablet  6  . Vitamins-Lipotropics (B-50 PO) Take by mouth daily.      . cilostazol (PLETAL) 50 MG tablet Take 1 tablet (50 mg total) by mouth 2 (two) times daily.  90 tablet  3   No current facility-administered medications for this visit.    Allergies  Allergen Reactions  . Dairy Aid [Lactase]   . Eggs Or Egg-Derived Products   . Peanut-Containing Drug Products     Does not know which nuts, but states nuts make him vomit    History   Social History  . Marital Status: Married    Spouse Name: N/A    Number of Children: 1  . Years of Education: N/A   Occupational History  . 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 Smoking status: Current Every Day Smoker -- 0.25  packs/day for 30 years    Types: Cigarettes  . Smokeless tobacco: Not on file  . Alcohol Use: No  . Drug Use: No  . Sexual Activity: Yes   Other Topics Concern  . Not on file   Social History Narrative  . No narrative on file    Family History  Problem Relation Age of Onset  . CAD Father 6869  . AAA (abdominal aortic aneurysm) Father   . Cardiomyopathy Daughter   . Stroke Mother     Review of Systems:  As stated in the HPI and otherwise negative.   BP 124/77  Pulse 51  Ht 6' (1.829 m)  Wt 172 lb 8 oz (78.245 kg)  BMI 23.39 kg/m2  Physical Examination: General: Well developed, well nourished, NAD HEENT: OP clear, mucus membranes moist SKIN: warm, dry. No rashes. Neuro: No focal deficits Musculoskeletal: Muscle strength 5/5 all ext Psychiatric: Mood and affect normal Neck: No JVD, no carotid bruits, no thyromegaly, no lymphadenopathy. Lungs:Clear bilaterally, no wheezes, rhonci, crackles Cardiovascular: Regular rate and rhythm. No murmurs, gallops or rubs. Abdomen:Soft. Bowel sounds present. Non-tender.  Extremities: No lower extremity edema.  vascular: Femoral pulses are normal bilaterally. Posterior tibial: +2 on the right and absent on the left side. Dorsalis pedis: Absent on both sides  EKG: Sinus bradycardia with ST and T wave changes in the inferior leads.   Assessment and Plan:    1. peripheral arterial disease : Mild to moderate non-lifestyle limiting claudication in both calves worse on the left side due to SFA disease.  He does not get claudication with regular activity and only when he does intense exercise. Thus, I favor attempted medical therapy to start with. He is planning to join the gym soon. I advised him to start a walking exercise program. I also started him on Pletal 50 mg twice daily. Side effects were explained.  If symptoms become more limiting, I recommend proceeding with lower extremity arterial angiography and possible endovascular  intervention.  2. CAD: Stable s/p CABG November 2014. . Doing well. Continue ASA, statin, beta blocker, Ace-inh.   3. Ischemic Cardiomyopathy: Echo 12/20/13 which was 6 weeks post CABG shows LVEF=35-40%.   4. Tobacco abuse:  I had a prolonged discussion with him about the importance of smoking cessation and association with peripheral arterial disease and potential worsening of progression to critical limb ischemia. I explained to him that this should be a priority for him.   I will have him followup with me in 3 months from now or earlier if needed.

## 2014-04-08 ENCOUNTER — Encounter (HOSPITAL_COMMUNITY): Payer: Self-pay | Admitting: Emergency Medicine

## 2014-04-08 ENCOUNTER — Emergency Department (HOSPITAL_COMMUNITY): Payer: 59

## 2014-04-08 DIAGNOSIS — IMO0002 Reserved for concepts with insufficient information to code with codable children: Secondary | ICD-10-CM | POA: Insufficient documentation

## 2014-04-08 DIAGNOSIS — S91009A Unspecified open wound, unspecified ankle, initial encounter: Principal | ICD-10-CM

## 2014-04-08 DIAGNOSIS — Z7982 Long term (current) use of aspirin: Secondary | ICD-10-CM | POA: Insufficient documentation

## 2014-04-08 DIAGNOSIS — I252 Old myocardial infarction: Secondary | ICD-10-CM | POA: Insufficient documentation

## 2014-04-08 DIAGNOSIS — Z23 Encounter for immunization: Secondary | ICD-10-CM | POA: Insufficient documentation

## 2014-04-08 DIAGNOSIS — F17200 Nicotine dependence, unspecified, uncomplicated: Secondary | ICD-10-CM | POA: Insufficient documentation

## 2014-04-08 DIAGNOSIS — I209 Angina pectoris, unspecified: Secondary | ICD-10-CM | POA: Insufficient documentation

## 2014-04-08 DIAGNOSIS — I2510 Atherosclerotic heart disease of native coronary artery without angina pectoris: Secondary | ICD-10-CM | POA: Insufficient documentation

## 2014-04-08 DIAGNOSIS — Y93H2 Activity, gardening and landscaping: Secondary | ICD-10-CM | POA: Insufficient documentation

## 2014-04-08 DIAGNOSIS — Y9289 Other specified places as the place of occurrence of the external cause: Secondary | ICD-10-CM | POA: Insufficient documentation

## 2014-04-08 DIAGNOSIS — Z86718 Personal history of other venous thrombosis and embolism: Secondary | ICD-10-CM | POA: Insufficient documentation

## 2014-04-08 DIAGNOSIS — I10 Essential (primary) hypertension: Secondary | ICD-10-CM | POA: Insufficient documentation

## 2014-04-08 DIAGNOSIS — Z9889 Other specified postprocedural states: Secondary | ICD-10-CM | POA: Insufficient documentation

## 2014-04-08 DIAGNOSIS — W28XXXA Contact with powered lawn mower, initial encounter: Secondary | ICD-10-CM | POA: Insufficient documentation

## 2014-04-08 DIAGNOSIS — Z79899 Other long term (current) drug therapy: Secondary | ICD-10-CM | POA: Insufficient documentation

## 2014-04-08 DIAGNOSIS — S81009A Unspecified open wound, unspecified knee, initial encounter: Secondary | ICD-10-CM | POA: Insufficient documentation

## 2014-04-08 DIAGNOSIS — Z792 Long term (current) use of antibiotics: Secondary | ICD-10-CM | POA: Insufficient documentation

## 2014-04-08 DIAGNOSIS — S81809A Unspecified open wound, unspecified lower leg, initial encounter: Principal | ICD-10-CM

## 2014-04-08 DIAGNOSIS — Z951 Presence of aortocoronary bypass graft: Secondary | ICD-10-CM | POA: Insufficient documentation

## 2014-04-08 IMAGING — CR DG TIBIA/FIBULA 2V*L*
4 series · 4 of 4 positions shown · non-contrast
Comparison: None.

CLINICAL DATA: Leg injury.  Laceration.

EXAM:
LEFT TIBIA AND FIBULA - 2 VIEW

[t tib/fib ap left (1 of 2)]
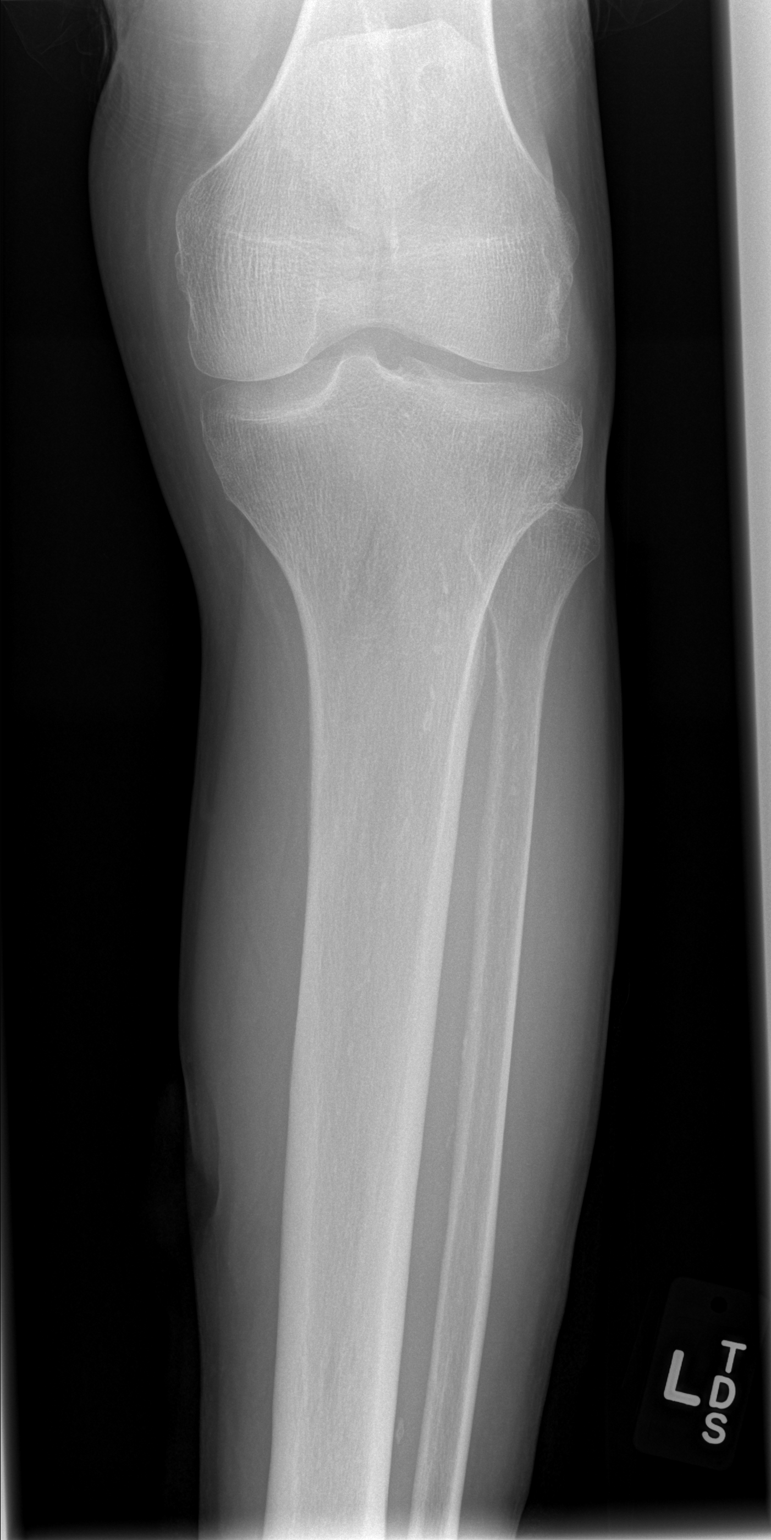

[t tib/fib ap left (2 of 2)]
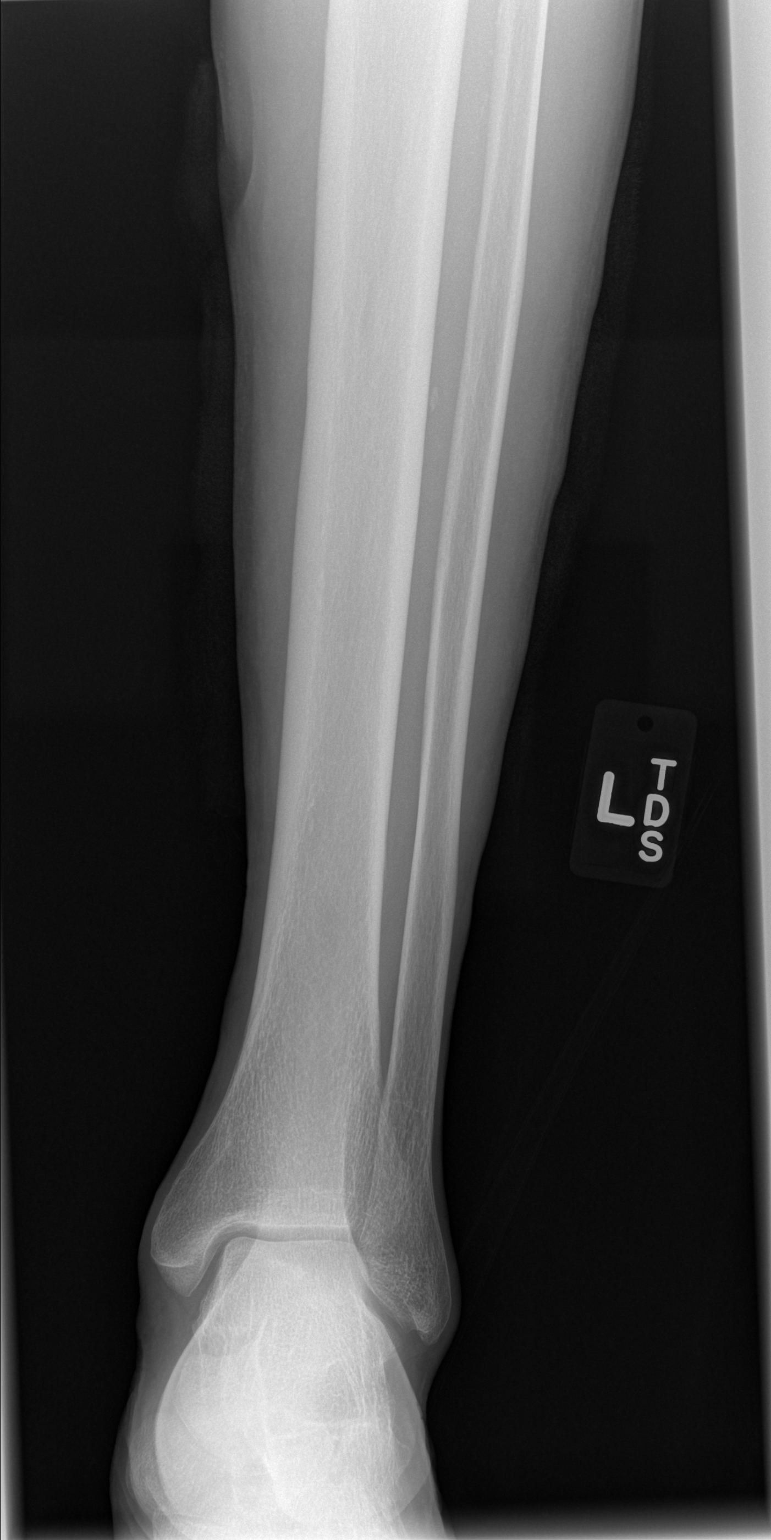

[t tib/fib lat left (1 of 2)]
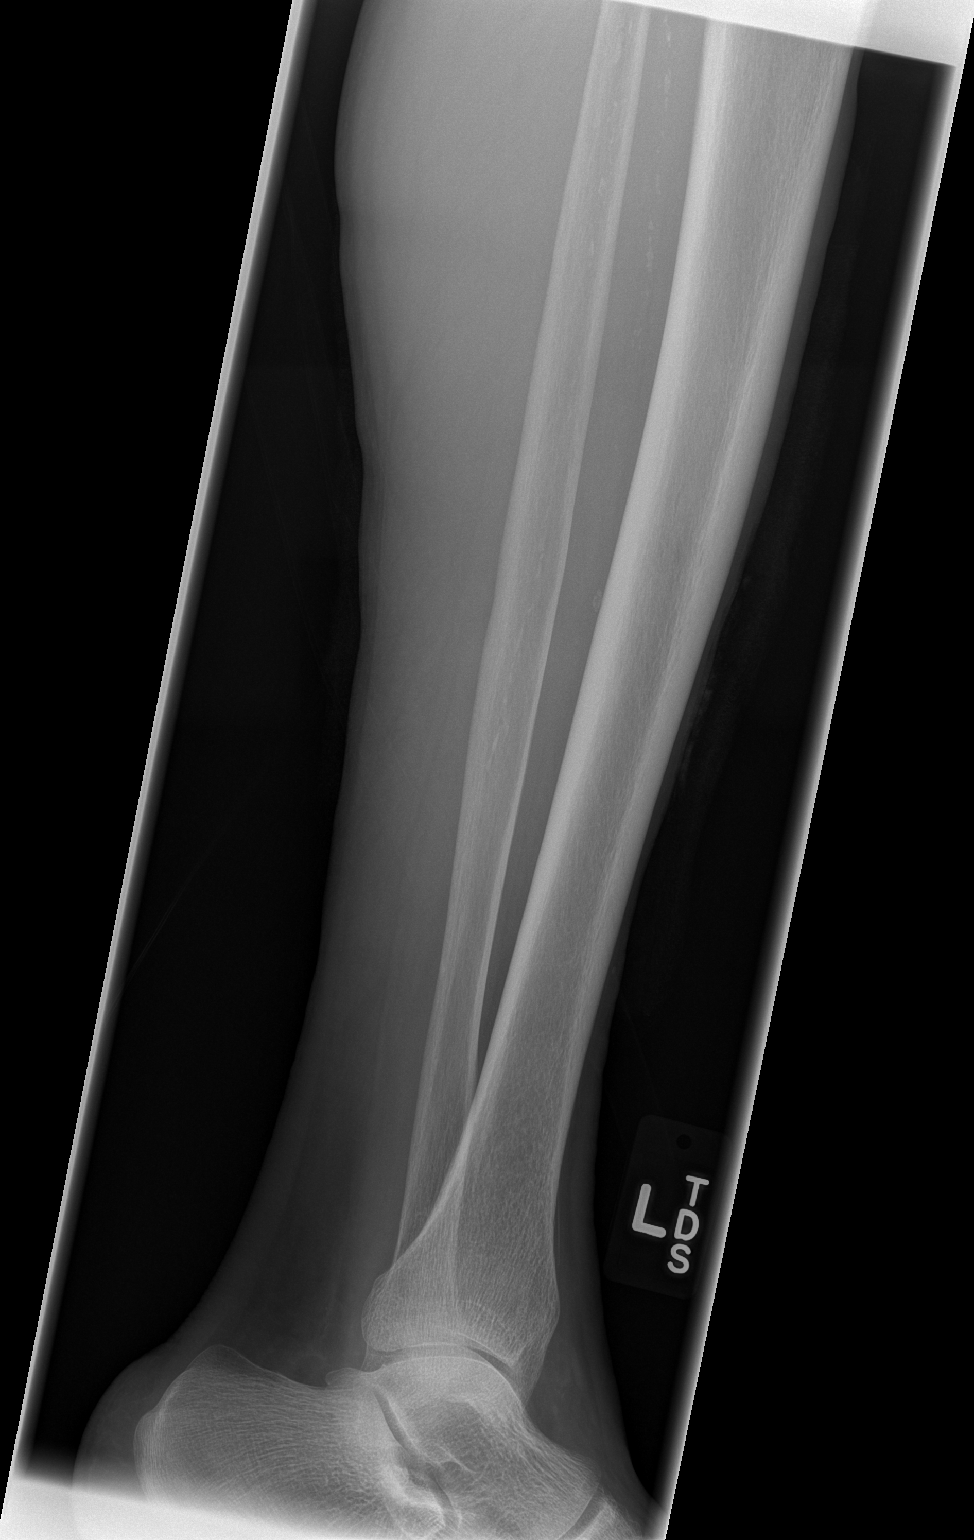

[t tib/fib lat left (2 of 2)]
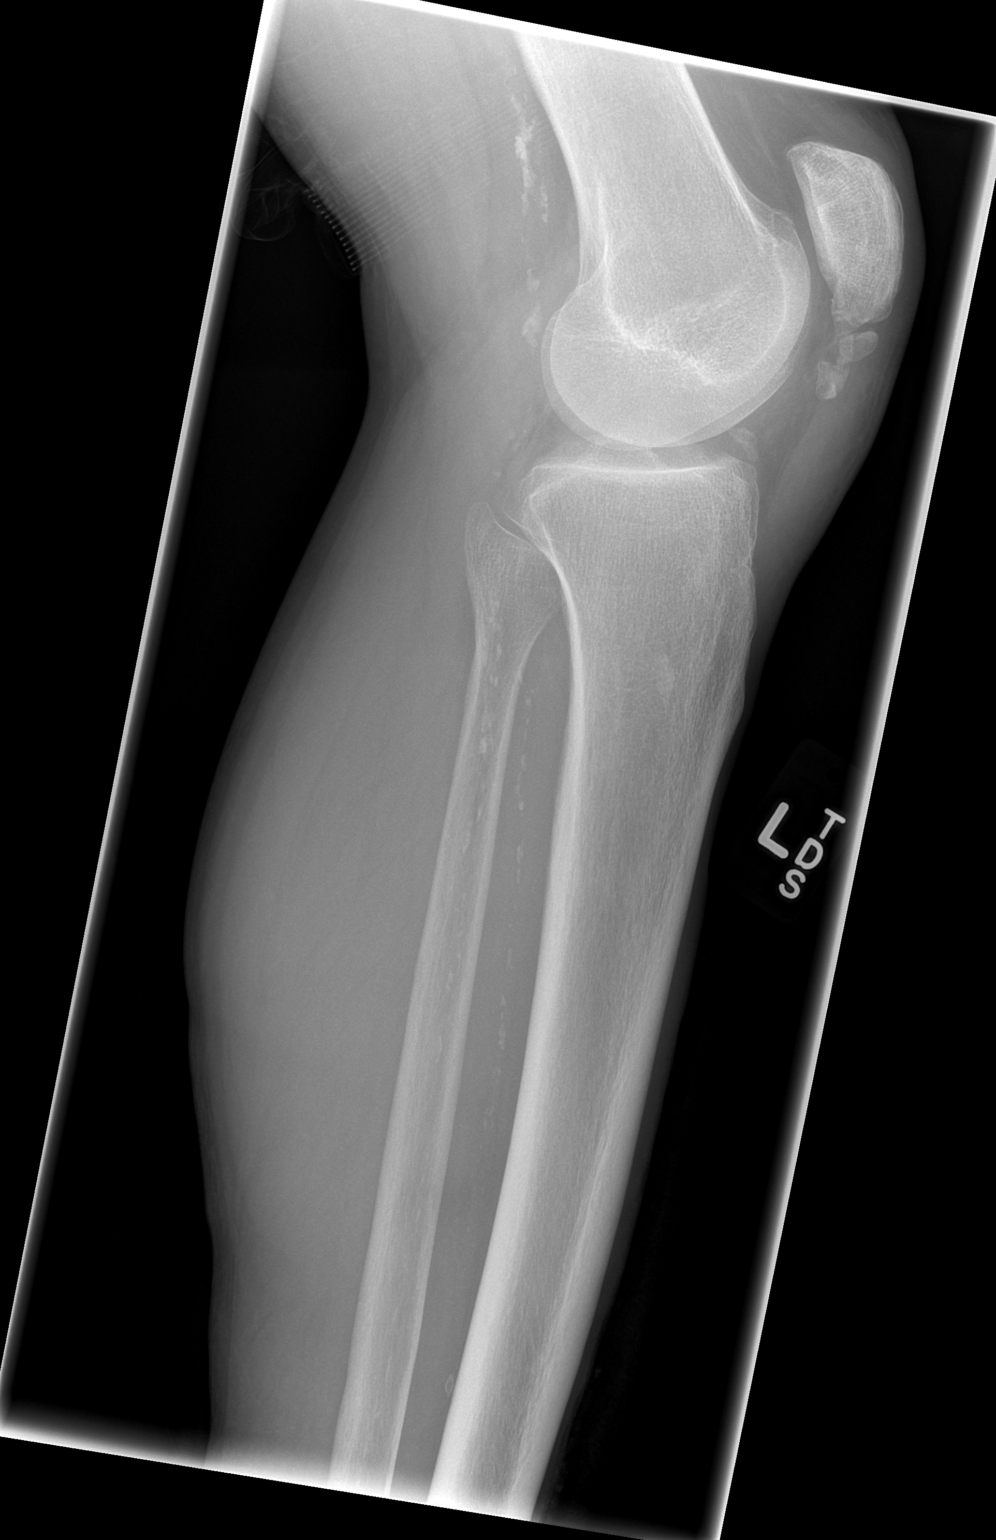

[4 of 4 positions shown; findings below may reference images not displayed]

FINDINGS: No acute bony abnormality. No fracture, subluxation or dislocation.
No radiopaque foreign bodies within the soft tissues.

Evidence of old patellar fracture. Mild degenerative changes within
the left knee. Vascular calcifications noted.
IMPRESSION: No acute bony abnormality.

## 2014-04-08 NOTE — ED Notes (Signed)
Patient was mowing lawn this evening, area is on uneven ground.  Patient states that the lawn mower started sliding and he had to fall off.  The lawn mower did hit his right shin and it split open.  The injury is about 3 inches long, about 1/2 inch wide.  Bleeding controlled.  Patient does have known blood clots in leg, no pulse in foot per last visit to MD office yesterday.  Patient does have paperwork with him.  CAOx3.

## 2014-04-08 NOTE — ED Notes (Signed)
The pt is now c/o some back pain.

## 2014-04-09 ENCOUNTER — Emergency Department (HOSPITAL_COMMUNITY)
Admission: EM | Admit: 2014-04-09 | Discharge: 2014-04-09 | Disposition: A | Discharge status: Home / Self Care | Payer: 59 | Attending: Emergency Medicine | Admitting: Emergency Medicine

## 2014-04-09 DIAGNOSIS — S81819A Laceration without foreign body, unspecified lower leg, initial encounter: Secondary | ICD-10-CM

## 2014-04-09 MED ORDER — TETANUS-DIPHTH-ACELL PERTUSSIS 5-2.5-18.5 LF-MCG/0.5 IM SUSP
0.5000 mL | Freq: Once | INTRAMUSCULAR | Status: AC
  Administered 2014-04-09: 0.5 mL via INTRAMUSCULAR
  Filled 2014-04-09: qty 0.5

## 2014-04-09 MED ORDER — AMOXICILLIN 500 MG PO CAPS: 500.0000 mg | ORAL_CAPSULE | Freq: Three times a day (TID) | ORAL | Status: DC

## 2014-04-09 NOTE — ED Notes (Signed)
Pulses marked on left foot and ankle.  Palpable

## 2014-04-09 NOTE — ED Notes (Signed)
Suture Cart at bedside for MD/Provider

## 2014-04-09 NOTE — ED Provider Notes (Signed)
CSN: [id number]     Arrival date & time 04/08/14  2109 History   First MD Initiated Contact with Patient 04/09/14 0036     Chief Complaint  Patient presents with  . Leg Injury     (Consider location/radiation/quality/duration/timing/severity/associated sxs/prior Treatment) HPI This patient is a very pleasant 63 year old man who sustained laceration to the anterior aspect of his left lower leg. He was on a riding lawn more which flipped. The patient does not know which part of the mower struck his leg causing a laceration. He has mild burning pain at the site of injury. He also notes some abrasions to his right arm. He denies head trauma and loss of consciousness.  The pain in his leg is nonradiating. Nothing makes it worse or better. Last tetanus unknown.  Past Medical History  Diagnosis Date  . Hypertension   . Tobacco abuse   . Anginal pain   . Ischemic cardiomyopathy   . CAD (coronary artery disease)   . MI (myocardial infarction)   . Clotting disorder     thighs   Past Surgical History  Procedure Laterality Date  . Toe surgery    . Appendectomy    . Knee surgery      fractured patella  . Mouth surgery    . Coronary artery bypass graft N/A 11/04/2013    Procedure: CORONARY ARTERY BYPASS GRAFTING (CABG) TIMES FOUR  USING LEFT INTERNAL MAMMARY ARTERY AND RIGHT AND LEFT SAPHENOUS LEG VEIN HARVESTED ENDOSCOPICALLY;  Surgeon: Loreli SlotSteven C Hendrickson, MD;  [facility];  Service: Open Heart Surgery;  Laterality: N/A;   Family History  Problem Relation Age of Onset  . CAD Father 1669  . AAA (abdominal aortic aneurysm) Father   . Cardiomyopathy Daughter   . Stroke Mother    History  Substance Use Topics  . Smoking status: Current Every Day Smoker -- 0.25 packs/day for 30 years    Types: Cigarettes  . Smokeless tobacco: Not on file  . Alcohol Use: No    Review of Systems Ten point review of symptoms performed and is negative with the exception of symptoms noted above.    Allergies  Dairy aid; Eggs or egg-derived products; and Peanut-containing drug products  Home Medications   Prior to Admission medications   Medication Sig Start Date End Date Taking? Authorizing Provider  ACAI PO Take 50 mg by mouth 4 (four) times daily.    Historical Provider, MD  amoxicillin (AMOXIL) 500 MG capsule Take 1 capsule (500 mg total) by mouth 3 (three) times daily. 04/09/14   Brandt LoosenJulie Manly, MD  aspirin EC 325 MG EC tablet Take 1 tablet (325 mg total) by mouth daily. 11/08/13   Erin Barrett, PA-C  atorvastatin (LIPITOR) 80 MG tablet Take 1 tablet (80 mg total) by mouth daily at 6 PM. 02/06/14   Kathleene Hazelhristopher D McAlhany, MD  cilostazol (PLETAL) 50 MG tablet Take 1 tablet (50 mg total) by mouth 2 (two) times daily. 04/07/14   Iran OuchMuhammad A Arida, MD  Coenzyme Q10 (CO Q 10 PO) Take 200 mg by mouth.    Historical Provider, MD  docusate sodium (COLACE) 250 MG capsule Take 250 mg by mouth daily.    Historical Provider, MD  lisinopril-hydrochlorothiazide (PRINZIDE,ZESTORETIC) 10-12.5 MG per tablet Take 1 tablet by mouth every morning. 12/26/13   Kathleene Hazelhristopher D McAlhany, MD  metoprolol tartrate (LOPRESSOR) 25 MG tablet Take 1 tablet (25 mg total) by mouth 2 (two) times daily. 12/07/13   Kathleene Hazelhristopher D McAlhany, MD  nitroGLYCERIN (NITROSTAT) 0.4 MG SL tablet Place 1 tablet (0.4 mg total) under the tongue every 5 (five) minutes as needed for chest pain. 02/28/14   Kathleene Hazelhristopher D McAlhany, MD  Vitamins-Lipotropics (B-50 PO) Take by mouth daily.    Historical Provider, MD   BP 132/61  Pulse 78  Temp(Src) 98.8 F (37.1 C) (Oral)  Resp 12  SpO2 98% Physical Exam Gen: well developed and well nourished appearing Head: NCAT Eyes: PERL, EOMI Nose: no epistaixis or rhinorrhea Mouth/throat: mucosa is moist and pink Neck: supple, no stridor Lungs: CTA B, no wheezing, rhonchi or rales CV: RRR, no murmur, extremities appear well perfused.  Abd: soft, notender, nondistended Back: no ttp, no cva  ttp Skin: warm and dry Ext: two 1cm superficial abrasions to the right forearm,  5cm abrasion anterior left lower leg and 8cm linear laceration distal to this. NVI. No active bleeding, otherwise normal to inspection, no dependent edema Neuro: CN ii-xii grossly intact, no focal deficits Psyche; normal affect,  calm and cooperative.   ED Course  Procedures (including critical care time) Labs Review Labs Reviewed - No data to display  Imaging Review Dg Tibia/fibula Left  04/08/2014   CLINICAL DATA:  Leg injury.  Laceration.  EXAM: LEFT TIBIA AND FIBULA - 2 VIEW  COMPARISON:  None.  FINDINGS: No acute bony abnormality. No fracture, subluxation or dislocation. No radiopaque foreign bodies within the soft tissues.  Evidence of old patellar fracture. Mild degenerative changes within the left knee. Vascular calcifications noted.  IMPRESSION: No acute bony abnormality.   Electronically Signed   By: Charlett NoseKevin  Dover M.D.   On: 04/08/2014 22:29    LACERATION REPAIR Performed by: Brandt LoosenJulie Manly Authorized by: Brandt LoosenJulie Manly Consent: Verbal consent obtained. Risks and benefits: risks, benefits and alternatives were discussed Consent given by: patient Patient identity confirmed: provided demographic data Prepped and Draped in normal sterile fashion Wound explored  Laceration Location: left lower leg  Laceration Length: 8cm  No Foreign Bodies seen or palpated  Anesthesia: local infiltration  Local anesthetic: lidocaine 1% with epinephrine  Anesthetic total: 5 ml  Irrigation method: syringe Amount of cleaning: standard  Subcutaneous closure with 3.0 vicryl - 3 sutures.  Skin closure: with 3.0 prolene - 8 sutures.    Patient tolerance: Patient tolerated the procedure well with no immediate complications.  MDM   Final diagnoses:  Laceration of lower leg without complication   Laceration repaired as per procedure note. Patient educated re: signs and sx of infection. Prophylaxis with Amox.  Stable for d/c with suture removal to be done at PCPs office.     Brandt LoosenJulie Manly, MD 04/09/14 [phone]

## 2014-04-09 NOTE — ED Notes (Signed)
Dressing applied to left lower leg.

## 2014-04-21 ENCOUNTER — Ambulatory Visit (INDEPENDENT_AMBULATORY_CARE_PROVIDER_SITE_OTHER): Payer: 59 | Admitting: Physician Assistant

## 2014-04-21 VITALS — BP 128/78 | HR 55 | Temp 98.3°F | Resp 14 | Ht 73.0 in | Wt 173.0 lb

## 2014-04-21 DIAGNOSIS — Z4802 Encounter for removal of sutures: Secondary | ICD-10-CM

## 2014-04-21 NOTE — Progress Notes (Signed)
   Subjective:    Patient ID: Lonnie [name], male    DOB: 01/25/1951, 63 y.o.   MRN: [id number]  HPI   Lonnie [name] is a very pleasant 63 yr old male here for suture removal.  [facility] .  Deep and superficial sutures were placed.  He reports that he is doing well.  Some tenderness remains around the wound, but no redness, swelling, drainage.     Review of Systems  Constitutional: Negative for fever and chills.  Respiratory: Negative.   Cardiovascular: Negative.   Skin: Positive for wound.       Objective:   Physical Exam  Vitals reviewed. Constitutional: He is oriented to person, place, and time. He appears well-developed and well-nourished. No distress.  HENT:  Head: Normocephalic and atraumatic.  Pulmonary/Chest: Effort normal.  Neurological: He is alert and oriented to person, place, and time.  Skin: Skin is warm and dry.     Healing laceration at anterior left leg; edges well approximated; sutures removed without difficulty; steristrips placed for extra support  Psychiatric: He has a normal mood and affect. His behavior is normal.       Assessment & Plan:  Visit for suture removal   Lonnie [name] is a very pleasant 63 yr old male here for suture removal.  Wound is healing well.  Sutures removed today.  Steri strips placed.  Pt is ok to resume normal activity.  Discussed RTC precautions    E. Frances FurbishElizabeth Randle Shatzer MHS, PA-C [facility] 5/1/20157:00 PM

## 2014-04-25 ENCOUNTER — Ambulatory Visit: Payer: 59 | Admitting: Cardiovascular Disease

## 2014-05-06 ENCOUNTER — Other Ambulatory Visit: Payer: Self-pay | Admitting: Cardiovascular Disease

## 2014-05-30 ENCOUNTER — Ambulatory Visit (INDEPENDENT_AMBULATORY_CARE_PROVIDER_SITE_OTHER): Payer: 59 | Admitting: Cardiovascular Disease

## 2014-05-30 ENCOUNTER — Encounter: Payer: Self-pay | Admitting: Cardiovascular Disease

## 2014-05-30 VITALS — BP 118/72 | HR 60 | Ht 72.0 in | Wt 170.8 lb

## 2014-05-30 DIAGNOSIS — I739 Peripheral vascular disease, unspecified: Secondary | ICD-10-CM

## 2014-05-30 LAB — BASIC METABOLIC PANEL
BUN: 16 mg/dL (ref 6–23)
CO2: 25 mEq/L (ref 19–32)
Calcium: 9.4 mg/dL (ref 8.4–10.5)
Chloride: 103 mEq/L (ref 96–112)
Creatinine, Ser: 1 mg/dL (ref 0.4–1.5)
GFR: 100.01 mL/min (ref >60.00)
Glucose, Bld: 96 mg/dL (ref 70–99)
Potassium: 4.2 mEq/L (ref 3.5–5.1)
Sodium: 137 mEq/L (ref 135–145)

## 2014-05-30 LAB — CBC
HCT: 40.8 % (ref 39.0–52.0)
Hemoglobin: 13.6 g/dL (ref 13.0–17.0)
MCHC: 33.4 g/dL (ref 30.0–36.0)
MCV: 82.4 fl (ref 78.0–100.0)
Platelets: 263 10*3/uL (ref 150.0–400.0)
RBC: 4.96 Mil/uL (ref 4.22–5.81)
RDW: 14.9 % (ref 11.5–15.5)
WBC: 8.8 10*3/uL (ref 4.0–10.5)

## 2014-05-30 LAB — PROTIME-INR
INR: 1 ratio (ref 0.8–1.0)
Prothrombin Time: 10.6 s (ref 9.6–13.1)

## 2014-05-30 MED ORDER — CEPHALEXIN 500 MG PO CAPS: 500.0000 mg | ORAL_CAPSULE | Freq: Three times a day (TID) | ORAL | Status: DC

## 2014-05-30 NOTE — Patient Instructions (Signed)
Your physician has requested that you have a peripheral vascular angiogram. This exam is performed at the hospital. During this exam IV contrast is used to look at arterial blood flow. Please review the information sheet given for details.  [facility].  [facility] have been [facility] to [facility].   Your physician has recommended you make the following change in your medication: START Keflex 500mg  take one by mouth every 8 hours for 7 days

## 2014-05-30 NOTE — Progress Notes (Signed)
Primary cardiologist: [name]  History of Present Illness: This is a very pleasant 63 year old man who is here today for a followup visit regarding peripheral arterial disease. He has known history of tobacco abuse, HTN and CAD . He was admitted to [facility] 10/29/13 with anterior STEMI and found to have moderately severe left main and LAD stenosis, moderately severe RCA stenosis. He underwent 4V CABG on 11/04/13 per Dr. Dorris FetchHendrickson (LIMA to mid LAD, SVG to OM, SVG to PDA, SVG to Diagonal). His post-operative course was uneventful. LVEF=40-45% prior to bypass.   He was seen in April for bilateral calf claudication worse on the left side.  Noninvasive evaluation showed an ABI of 0.78 on the right and 0.41 on the left with evidence of left distal SFA occlusion and significant disease in the right SFA. He was treated medically and was prescribed Pletal. Few days after he was seen, he had an accident while on the riding lawnmower which flipped. He had an 8 cm laceration on the left shin area. He went to the emergency room where it was sutured. It has been almost 2 months since that event and the wound is still open and has not healed. He noticed recent tightness and some discharge.   Primary Care Physician: Dr. Earlene Plateravis ([facility])  Last Lipid Profile:Lipid Panel     Component Value Date/Time   CHOL 99 03/09/2014 1401   TRIG 47.0 03/09/2014 1401   HDL 47.50 03/09/2014 1401   CHOLHDL 2 03/09/2014 1401   VLDL 9.4 03/09/2014 1401   LDLCALC 42 03/09/2014 1401     Past Medical History  Diagnosis Date  . Hypertension   . Tobacco abuse   . Anginal pain   . Ischemic cardiomyopathy   . CAD (coronary artery disease)   . MI (myocardial infarction)   . Clotting disorder     thighs    Past Surgical History  Procedure Laterality Date  . Toe surgery    . Appendectomy    . Knee surgery      fractured patella  . Mouth surgery    . Coronary artery bypass graft N/A 11/04/2013     Procedure: CORONARY ARTERY BYPASS GRAFTING (CABG) TIMES FOUR  USING LEFT INTERNAL MAMMARY ARTERY AND RIGHT AND LEFT SAPHENOUS LEG VEIN HARVESTED ENDOSCOPICALLY;  Surgeon: Loreli SlotSteven C Hendrickson, MD;  [facility];  Service: Open Heart Surgery;  Laterality: N/A;    Current Outpatient Prescriptions  Medication Sig Dispense Refill  . ACAI PO Take 50 mg by mouth 4 (four) times daily.      Marland Kitchen. aspirin EC 325 MG EC tablet Take 1 tablet (325 mg total) by mouth daily.  30 tablet  0  . atorvastatin (LIPITOR) 80 MG tablet Take 1 tablet (80 mg total) by mouth daily at 6 PM.  90 tablet  1  . cilostazol (PLETAL) 50 MG tablet Take 1 tablet (50 mg total) by mouth 2 (two) times daily.  90 tablet  3  . Coenzyme Q10 (CO Q 10 PO) Take 200 mg by mouth.      . docusate sodium (COLACE) 250 MG capsule Take 250 mg by mouth daily.      Marland Kitchen. lisinopril-hydrochlorothiazide (PRINZIDE,ZESTORETIC) 10-12.5 MG per tablet Take 1 tablet by mouth  every morning  90 tablet  3  . metoprolol tartrate (LOPRESSOR) 25 MG tablet Take 1 tablet (25 mg total) by mouth 2 (two) times daily.  180 tablet  3  . nitroGLYCERIN (NITROSTAT) 0.4 MG  SL tablet Place 1 tablet (0.4 mg total) under the tongue every 5 (five) minutes as needed for chest pain.  25 tablet  6  . Vitamins-Lipotropics (B-50 PO) Take by mouth daily.       No current facility-administered medications for this visit.    Allergies  Allergen Reactions  . Dairy Aid [Lactase]   . Eggs Or Egg-Derived Products   . Peanut-Containing Drug Products     Does not know which nuts, but states nuts make him vomit    History   Social History  . Marital Status: Married    Spouse Name: N/A    Number of Children: 1  . Years of Education: N/A   Occupational History  . Photographer   Social History Main Topics  . Smoking status: Current Every Day Smoker -- 0.25 packs/day for 30 years    Types: Cigarettes  . Smokeless tobacco: Not on file  . Alcohol Use: No  . Drug Use:  No  . Sexual Activity: Yes   Other Topics Concern  . Not on file   Social History Narrative  . No narrative on file    Family History  Problem Relation Age of Onset  . CAD Father 28  . AAA (abdominal aortic aneurysm) Father   . Cardiomyopathy Daughter   . Stroke Mother     Review of Systems:  As stated in the HPI and otherwise negative.   BP 118/72  Pulse 60  Ht 6' (1.829 m)  Wt 170 lb 12.8 oz (77.474 kg)  BMI 23.16 kg/m2  Physical Examination: General: Well developed, well nourished, NAD HEENT: OP clear, mucus membranes moist SKIN: warm, dry. No rashes. Neuro: No focal deficits Musculoskeletal: Muscle strength 5/5 all ext Psychiatric: Mood and affect normal Neck: No JVD, no carotid bruits, no thyromegaly, no lymphadenopathy. Lungs:Clear bilaterally, no wheezes, rhonci, crackles Cardiovascular: Regular rate and rhythm. No murmurs, gallops or rubs. Abdomen:Soft. Bowel sounds present. Non-tender.  Extremities: No lower extremity edema.  vascular: Femoral pulses are normal bilaterally. Posterior tibial: +2 on the right and absent on the left side. Dorsalis pedis: Absent on both sides.  There is a 6-8 cm laceration on the left shin with redness and possible small amount of dry pus    Assessment and Plan:    1. peripheral arterial disease : Initially with moderate  claudication in both calves worse on the left side due to SFA disease.   However, he now has an open wound on the left shin which has not healed over the last 2 months. There is also evidence of early infection. I started him on Keflex for one week. I do think that the slow healing is related to his peripheral arterial disease. Thus, I recommend proceeding with abdominal aortogram, lower extremity runoff and possible angioplasty. Risks, benefits and alternatives were discussed. I am also referring him to the wound clinic.  2. CAD: Stable s/p CABG November 2014. . Doing well. Continue ASA, statin, beta blocker,  Ace-inh.   3. Ischemic Cardiomyopathy: Echo 12/20/13 which was 6 weeks post CABG shows LVEF=35-40%.   4. Tobacco abuse:  I had a prolonged discussion with him about the importance of smoking cessation and association with peripheral arterial disease and potential worsening of progression to critical limb ischemia. I explained to him that this should be a priority for him.

## 2014-06-01 ENCOUNTER — Encounter (HOSPITAL_COMMUNITY): Payer: Self-pay | Admitting: Pharmacy Technician

## 2014-06-07 ENCOUNTER — Other Ambulatory Visit: Payer: Self-pay | Admitting: Cardiovascular Disease

## 2014-06-07 ENCOUNTER — Encounter (HOSPITAL_COMMUNITY)
Admission: RE | Disposition: A | Discharge status: Home / Self Care | Payer: Self-pay | Source: Ambulatory Visit | Attending: Cardiovascular Disease

## 2014-06-07 ENCOUNTER — Ambulatory Visit (HOSPITAL_COMMUNITY)
Admission: RE | Admit: 2014-06-07 | Discharge: 2014-06-07 | Disposition: A | Discharge status: Home / Self Care | Payer: 59 | Source: Ambulatory Visit | Attending: Cardiovascular Disease | Admitting: Cardiovascular Disease

## 2014-06-07 DIAGNOSIS — I10 Essential (primary) hypertension: Secondary | ICD-10-CM | POA: Insufficient documentation

## 2014-06-07 DIAGNOSIS — F17200 Nicotine dependence, unspecified, uncomplicated: Secondary | ICD-10-CM | POA: Insufficient documentation

## 2014-06-07 DIAGNOSIS — Z7982 Long term (current) use of aspirin: Secondary | ICD-10-CM | POA: Insufficient documentation

## 2014-06-07 DIAGNOSIS — Z7902 Long term (current) use of antithrombotics/antiplatelets: Secondary | ICD-10-CM | POA: Insufficient documentation

## 2014-06-07 DIAGNOSIS — I2510 Atherosclerotic heart disease of native coronary artery without angina pectoris: Secondary | ICD-10-CM | POA: Insufficient documentation

## 2014-06-07 DIAGNOSIS — I739 Peripheral vascular disease, unspecified: Secondary | ICD-10-CM

## 2014-06-07 DIAGNOSIS — L98499 Non-pressure chronic ulcer of skin of other sites with unspecified severity: Secondary | ICD-10-CM

## 2014-06-07 DIAGNOSIS — I2589 Other forms of chronic ischemic heart disease: Secondary | ICD-10-CM | POA: Insufficient documentation

## 2014-06-07 DIAGNOSIS — I252 Old myocardial infarction: Secondary | ICD-10-CM | POA: Insufficient documentation

## 2014-06-07 DIAGNOSIS — Z951 Presence of aortocoronary bypass graft: Secondary | ICD-10-CM | POA: Insufficient documentation

## 2014-06-07 DIAGNOSIS — L97209 Non-pressure chronic ulcer of unspecified calf with unspecified severity: Secondary | ICD-10-CM | POA: Insufficient documentation

## 2014-06-07 HISTORY — PX: ABDOMINAL AORTAGRAM: SHX5454

## 2014-06-07 LAB — POCT ACTIVATED CLOTTING TIME
Activated Clotting Time: 188 seconds
Activated Clotting Time: 215 seconds
Activated Clotting Time: 243 seconds

## 2014-06-07 SURGERY — ABDOMINAL AORTAGRAM
Anesthesia: LOCAL

## 2014-06-07 MED ORDER — SODIUM CHLORIDE 0.9 % IV SOLN: INTRAVENOUS | Status: DC

## 2014-06-07 MED ORDER — HEPARIN (PORCINE) IN NACL 2-0.9 UNIT/ML-% IJ SOLN
INTRAMUSCULAR | Status: AC
  Filled 2014-06-07: qty 1000

## 2014-06-07 MED ORDER — FENTANYL CITRATE 0.05 MG/ML IJ SOLN
INTRAMUSCULAR | Status: AC
  Filled 2014-06-07: qty 2

## 2014-06-07 MED ORDER — SODIUM CHLORIDE 0.9 % IV SOLN
INTRAVENOUS | Status: DC
  Administered 2014-06-07: 07:00:00 via INTRAVENOUS

## 2014-06-07 MED ORDER — CLOPIDOGREL BISULFATE 75 MG PO TABS: 75.0000 mg | ORAL_TABLET | Freq: Every day | ORAL | Status: DC

## 2014-06-07 MED ORDER — ASPIRIN 81 MG PO CHEW: 81.0000 mg | CHEWABLE_TABLET | ORAL | Status: DC

## 2014-06-07 MED ORDER — CLOPIDOGREL BISULFATE 300 MG PO TABS
ORAL_TABLET | ORAL | Status: AC
  Filled 2014-06-07: qty 1

## 2014-06-07 MED ORDER — SODIUM CHLORIDE 0.9 % IV SOLN: 250.0000 mL | INTRAVENOUS | Status: DC | PRN

## 2014-06-07 MED ORDER — LIDOCAINE HCL (PF) 1 % IJ SOLN
INTRAMUSCULAR | Status: AC
  Filled 2014-06-07: qty 30

## 2014-06-07 MED ORDER — MIDAZOLAM HCL 2 MG/2ML IJ SOLN
INTRAMUSCULAR | Status: AC
  Filled 2014-06-07: qty 2

## 2014-06-07 MED ORDER — SODIUM CHLORIDE 0.9 % IJ SOLN: 3.0000 mL | Freq: Two times a day (BID) | INTRAMUSCULAR | Status: DC

## 2014-06-07 MED ORDER — SODIUM CHLORIDE 0.9 % IJ SOLN: 3.0000 mL | INTRAMUSCULAR | Status: DC | PRN

## 2014-06-07 MED ORDER — HEPARIN SODIUM (PORCINE) 1000 UNIT/ML IJ SOLN
INTRAMUSCULAR | Status: AC
  Filled 2014-06-07: qty 1

## 2014-06-07 NOTE — H&P (View-Only) (Signed)
Primary cardiologist: [name]  History of Present Illness: This is a very pleasant 63 year old man who is here today for a followup visit regarding peripheral arterial disease. He has known history of tobacco abuse, HTN and CAD . He was admitted to [facility] 10/29/13 with anterior STEMI and found to have moderately severe left main and LAD stenosis, moderately severe RCA stenosis. He underwent 4V CABG on 11/04/13 per Dr. Dorris FetchHendrickson (LIMA to mid LAD, SVG to OM, SVG to PDA, SVG to Diagonal). His post-operative course was uneventful. LVEF=40-45% prior to bypass.   He was seen in April for bilateral calf claudication worse on the left side.  Noninvasive evaluation showed an ABI of 0.78 on the right and 0.41 on the left with evidence of left distal SFA occlusion and significant disease in the right SFA. He was treated medically and was prescribed Pletal. Few days after he was seen, he had an accident while on the riding lawnmower which flipped. He had an 8 cm laceration on the left shin area. He went to the emergency room where it was sutured. It has been almost 2 months since that event and the wound is still open and has not healed. He noticed recent tightness and some discharge.   Primary Care Physician: Dr. Earlene Plateravis ([facility])  Last Lipid Profile:Lipid Panel     Component Value Date/Time   CHOL 99 03/09/2014 1401   TRIG 47.0 03/09/2014 1401   HDL 47.50 03/09/2014 1401   CHOLHDL 2 03/09/2014 1401   VLDL 9.4 03/09/2014 1401   LDLCALC 42 03/09/2014 1401     Past Medical History  Diagnosis Date  . Hypertension   . Tobacco abuse   . Anginal pain   . Ischemic cardiomyopathy   . CAD (coronary artery disease)   . MI (myocardial infarction)   . Clotting disorder     thighs    Past Surgical History  Procedure Laterality Date  . Toe surgery    . Appendectomy    . Knee surgery      fractured patella  . Mouth surgery    . Coronary artery bypass graft N/A 11/04/2013     Procedure: CORONARY ARTERY BYPASS GRAFTING (CABG) TIMES FOUR  USING LEFT INTERNAL MAMMARY ARTERY AND RIGHT AND LEFT SAPHENOUS LEG VEIN HARVESTED ENDOSCOPICALLY;  Surgeon: Loreli SlotSteven C Hendrickson, MD;  Location[facility] OR;  Service: Open Heart Surgery;  Laterality: N/A;    Current Outpatient Prescriptions  Medication Sig Dispense Refill  . ACAI PO Take 50 mg by mouth 4 (four) times daily.      Marland Kitchen. aspirin EC 325 MG EC tablet Take 1 tablet (325 mg total) by mouth daily.  30 tablet  0  . atorvastatin (LIPITOR) 80 MG tablet Take 1 tablet (80 mg total) by mouth daily at 6 PM.  90 tablet  1  . cilostazol (PLETAL) 50 MG tablet Take 1 tablet (50 mg total) by mouth 2 (two) times daily.  90 tablet  3  . Coenzyme Q10 (CO Q 10 PO) Take 200 mg by mouth.      . docusate sodium (COLACE) 250 MG capsule Take 250 mg by mouth daily.      Marland Kitchen. lisinopril-hydrochlorothiazide (PRINZIDE,ZESTORETIC) 10-12.5 MG per tablet Take 1 tablet by mouth  every morning  90 tablet  3  . metoprolol tartrate (LOPRESSOR) 25 MG tablet Take 1 tablet (25 mg total) by mouth 2 (two) times daily.  180 tablet  3  . nitroGLYCERIN (NITROSTAT) 0.4 MG  SL tablet Place 1 tablet (0.4 mg total) under the tongue every 5 (five) minutes as needed for chest pain.  25 tablet  6  . Vitamins-Lipotropics (B-50 PO) Take by mouth daily.       No current facility-administered medications for this visit.    Allergies  Allergen Reactions  . Dairy Aid [Lactase]   . Eggs Or Egg-Derived Products   . Peanut-Containing Drug Products     Does not know which nuts, but states nuts make him vomit    History   Social History  . Marital Status: Married    Spouse Name: N/A    Number of Children: 1  . Years of Education: N/A   Occupational History  . pilot [employer]   Social History Main Topics  . Smoking status: Current Every Day Smoker -- 0.25 packs/day for 30 years    Types: Cigarettes  . Smokeless tobacco: Not on file  . Alcohol Use: No  . Drug Use:  No  . Sexual Activity: Yes   Other Topics Concern  . Not on file   Social History Narrative  . No narrative on file    Family History  Problem Relation Age of Onset  . CAD Father 69  . AAA (abdominal aortic aneurysm) Father   . Cardiomyopathy Daughter   . Stroke Mother     Review of Systems:  As stated in the HPI and otherwise negative.   BP 118/72  Pulse 60  Ht 6' (1.829 m)  Wt 170 lb 12.8 oz (77.474 kg)  BMI 23.16 kg/m2  Physical Examination: General: Well developed, well nourished, NAD HEENT: OP clear, mucus membranes moist SKIN: warm, dry. No rashes. Neuro: No focal deficits Musculoskeletal: Muscle strength 5/5 all ext Psychiatric: Mood and affect normal Neck: No JVD, no carotid bruits, no thyromegaly, no lymphadenopathy. Lungs:Clear bilaterally, no wheezes, rhonci, crackles Cardiovascular: Regular rate and rhythm. No murmurs, gallops or rubs. Abdomen:Soft. Bowel sounds present. Non-tender.  Extremities: No lower extremity edema.  vascular: Femoral pulses are normal bilaterally. Posterior tibial: +2 on the right and absent on the left side. Dorsalis pedis: Absent on both sides.  There is a 6-8 cm laceration on the left shin with redness and possible small amount of dry pus    Assessment and Plan:    1. peripheral arterial disease : Initially with moderate  claudication in both calves worse on the left side due to SFA disease.   However, he now has an open wound on the left shin which has not healed over the last 2 months. There is also evidence of early infection. I started him on Keflex for one week. I do think that the slow healing is related to his peripheral arterial disease. Thus, I recommend proceeding with abdominal aortogram, lower extremity runoff and possible angioplasty. Risks, benefits and alternatives were discussed. I am also referring him to the wound clinic.  2. CAD: Stable s/p CABG November 2014. . Doing well. Continue ASA, statin, beta blocker,  Ace-inh.   3. Ischemic Cardiomyopathy: Echo 12/20/13 which was 6 weeks post CABG shows LVEF=35-40%.   4. Tobacco abuse:  I had a prolonged discussion with him about the importance of smoking cessation and association with peripheral arterial disease and potential worsening of progression to critical limb ischemia. I explained to him that this should be a priority for him.    

## 2014-06-07 NOTE — CV Procedure (Signed)
PERIPHERAL VASCULAR PROCEDURE  NAME:  Lonnie [name]   MRN: [id number] DOB:  1951-12-19   ADMIT DATE: 06/07/2014  Performing Cardiologist: Lorine BearsMuhammad Krystalyn Kubota Primary Physician: No PCP Per Patient Primary Cardiologist:  CM  Procedures Performed:  Abdominal Aortic Angiogram with Bi-Iliofemoral Runoff  Bilateral Lower Extremity Angiography (1st Order)  Selective left lower extremity arterial angiography  Left SFA/popliteal artery angioplasty and 2 Supera self-expanding stent placement.   Mynx closure device. Unsuccessful.    Indication(s):   Severe Claudication with slowly healing wound on the left shin.    Consent: The procedure with Risks/Benefits/Alternatives and Indications was reviewed with the patient .  All questions were answered.  Medications:  Sedation:  2 mg IV Versed, 75 mcg IV Fentanyl  Contrast:  170 ml   Visipaque   Procedural details: The right groin was prepped, draped, and anesthetized with 1% lidocaine. Using modified Seldinger technique, a 5 French sheath was introduced into the right common femoral artery. A 5 Fr Short Pigtail Catheter was advanced of over a  Versicore wire into the descending Aorta to a level just above the renal arteries. A power injection of 5120ml/sec contrast over 1 sec was performed for Abdominal Aortic Angiography.  The catheter was then pulled back to a level just above the Aortic bifurcation. Bilateral lower extremity arterial run off was then performed via power injection of 7 ml / sec contrast for a total of 77 ml.   Interventional Procedure:  The pigtail catheter was changed over the Versicore wire for A crossover catheter which was then pulled back the aortic bifurcation and the wire was advanced down the contralateral common iliac artery.  The Glide advantage  wire was then advanced to the contralateral common femoral artery, the catheter was exchanged into a 6 French 45 cm destination sheath . The patient was given 5000  units of unfractionated heparin and subsequently 2 additional doses for a total of 10,000 units of heparin with an ACT of 243. The left SFA was completely occluded distally into the proximal left popliteal artery. I used the Glidewire over a Navicross catheter to cross the occlusion with moderate difficulty. The catheter was then advanced and the wire was removed. Angiography via the catheter verified intraluminal position. I then placed a [place] core 014 wire. The lesion was predilated with a 6 x 100 mm mustang balloon. There was another significant disease in the midsegment which was also dilated. This established flow across the occlusion but there was significant dissection. I then placed a 5.5 x 150 mm Supera self-expanding stent which was overlapped proximally with a 5.5 x 100 mm Supera self-expanding stent. These were post dilated with a 6 mm balloon with excellent results.  The sheath was removed and exchanged into a short 6 [place] sheath.  Hemostasis was achieved with a a Mynx closure device.  however, the collagen plug came out partially and probably was not completely sealing the arteriotomy site. Thus, manual compression was performed to The patient tolerated the procedure well with no immediate complications.    Hemodynamics:  Central Aortic Pressure / Mean Aortic Pressure:  117/60   Findings:  Abdominal aorta:  normal in size with no significant aneurysm or obstructive disease.   Left renal artery:  normal   Right renal artery:  normal   Celiac artery:  not visualized   Superior mesenteric artery:  patent   Right common iliac artery:  minor irregularities.   Right internal iliac artery:  minor irregularities.  Right external iliac artery:  minor irregularities.   Right common femoral artery:  normal   Right profunda femoral artery:  normal   Right superficial femoral artery:  mild diffuse disease in the proximal and mid area . There might be a discrete 60-70% stenosis in  the distal segment but not well visualized.   Right popliteal artery:  minor irregularities.   There is only 1 vessel runoff via the peroneal artery. The posterior tibial artery is occluded in the midsegment. The anterior tibial ulcers seems to be occluded proximally.   Left common iliac artery:   minor irregularities.   Left internal iliac artery:  patent   Left external iliac artery:  20% disease.   Left common femoral artery:  normal   Left profunda femoral artery:  normal   Left superficial femoral artery:   diffuse 20-30% disease throughout its course proximally with 60% stenosis in the midsegment followed by total occlusion distally with reconstitution in the proximal popliteal artery via collaterals.   Left popliteal artery:  30% mid stenosis.   2 vessel runoff below the knee with an occluded posterior tibial artery. The dominant vessel is the peroneal artery which reconstituted posterior tibial distally. The anterior tibial artery is small overall.   Conclusions:  1. No significant aortoiliac disease.   2. Occluded left distal SFA into the proximal popliteal artery with 2 vessel runoff below the knee. 3. Possible distal right SFA stenosis with 1 vessel runoff below the knee. 4. Successful angioplasty and self-expanding stent placement to the left SFA.  Recommendations:   Continue dual antiplatelet therapy for at least one month. Smoking cessation is strongly advised.    Lorine BearsMuhammad Nissi Doffing, MD, [facility] 06/07/2014 10:19 AM

## 2014-06-07 NOTE — Interval H&P Note (Signed)
History and Physical Interval Note:  06/07/2014 8:38 AM  Lonnie [name]  has presented today for surgery, with the diagnosis of pvd  The various methods of treatment have been discussed with the patient and family. After consideration of risks, benefits and other options for treatment, the patient has consented to  Procedure(s): ABDOMINAL AORTAGRAM (N/A) as a surgical intervention .  The patient's history has been reviewed, patient examined, no change in status, stable for surgery.  I have reviewed the patient's chart and labs.  Questions were answered to the patient's satisfaction.     Lorine BearsMuhammad Daray Polgar

## 2014-06-07 NOTE — Discharge Instructions (Signed)
Stop taking Pletal  Start taking Plavix 75 mg once daily (a prescription was sent to [facility]).   Angiogram, Care After Refer to this sheet in the next few weeks. These instructions provide you with information on caring for yourself after your procedure. Your health care provider may also give you more specific instructions. Your treatment has been planned according to current medical practices, but problems sometimes occur. Call your health care provider if you have any problems or questions after your procedure.  WHAT TO EXPECT AFTER THE PROCEDURE After your procedure, it is typical to have the following sensations:  Minor discomfort or tenderness and a small bump at the catheter insertion site. The bump should usually decrease in size and tenderness within 1 to 2 weeks.  Any bruising will usually fade within 2 to 4 weeks. HOME CARE INSTRUCTIONS   You may need to keep taking blood thinners if they were prescribed for you. Only take over-the-counter or prescription medicines for pain, fever, or discomfort as directed by your health care provider.  Do not apply powder or lotion to the site.  Do not sit in a bathtub, swimming pool, or whirlpool for 5 to 7 days.  You may shower 24 hours after the procedure. Remove the bandage (dressing) and gently wash the site with plain soap and water. Gently pat the site dry.  Inspect the site at least twice daily.  Limit your activity for the first 48 hours. Do not bend, squat, or lift anything over 20 lb (9 kg) or as directed by your health care provider.  Do not drive home if you are discharged the day of the procedure. Have someone else drive you. Follow instructions about when you can drive or return to work. SEEK MEDICAL CARE IF:  You get lightheaded when standing up.  You have drainage (other than a small amount of blood on the dressing).  You have chills.  You have a fever.  You have redness, warmth, swelling, or pain at the insertion  site. SEEK IMMEDIATE MEDICAL CARE IF:   You develop chest pain or shortness of breath, feel faint, or pass out.  You have bleeding, swelling larger than a walnut, or drainage from the catheter insertion site.  You develop pain, discoloration, coldness, or severe bruising in the leg or arm that held the catheter.  You develop bleeding from any other place, such as the bowels. You may see bright red blood in your urine or stools, or your stools may appear black and tarry.  You have heavy bleeding from the site. If this happens, hold pressure on the site. MAKE SURE YOU:  Understand these instructions.  Will watch your condition.  Will get help right away if you are not doing well or get worse. Document Released: 06/26/2005 Document Revised: 12/13/2013 Document Reviewed: 05/02/2013 [facility] Patient Information 2015 [place]. This information is not intended to replace advice given to you by your health care provider. Make sure you discuss any questions you have with your health care provider.

## 2014-06-09 ENCOUNTER — Other Ambulatory Visit: Payer: Self-pay

## 2014-06-09 DIAGNOSIS — I739 Peripheral vascular disease, unspecified: Secondary | ICD-10-CM

## 2014-06-12 ENCOUNTER — Telehealth: Payer: Self-pay | Admitting: Cardiovascular Disease

## 2014-06-12 DIAGNOSIS — I2510 Atherosclerotic heart disease of native coronary artery without angina pectoris: Secondary | ICD-10-CM | POA: Insufficient documentation

## 2014-06-12 DIAGNOSIS — I252 Old myocardial infarction: Secondary | ICD-10-CM | POA: Insufficient documentation

## 2014-06-12 DIAGNOSIS — T465X5A Adverse effect of other antihypertensive drugs, initial encounter: Secondary | ICD-10-CM | POA: Insufficient documentation

## 2014-06-12 DIAGNOSIS — Z7982 Long term (current) use of aspirin: Secondary | ICD-10-CM | POA: Insufficient documentation

## 2014-06-12 DIAGNOSIS — Z862 Personal history of diseases of the blood and blood-forming organs and certain disorders involving the immune mechanism: Secondary | ICD-10-CM | POA: Insufficient documentation

## 2014-06-12 DIAGNOSIS — Z79899 Other long term (current) drug therapy: Secondary | ICD-10-CM | POA: Insufficient documentation

## 2014-06-12 DIAGNOSIS — T783XXA Angioneurotic edema, initial encounter: Secondary | ICD-10-CM | POA: Insufficient documentation

## 2014-06-12 DIAGNOSIS — I10 Essential (primary) hypertension: Secondary | ICD-10-CM | POA: Insufficient documentation

## 2014-06-12 DIAGNOSIS — F17200 Nicotine dependence, unspecified, uncomplicated: Secondary | ICD-10-CM | POA: Insufficient documentation

## 2014-06-12 MED ORDER — PRASUGREL HCL 10 MG PO TABS: 10.0000 mg | ORAL_TABLET | Freq: Every day | ORAL | Status: DC

## 2014-06-12 NOTE — Telephone Encounter (Signed)
Spoke with pt, aware of dr Jari Sportsmanarida's recommendation. New script sent into the pharm.

## 2014-06-12 NOTE — Telephone Encounter (Signed)
New message  Pt called states that Dr. Kirke CorinArida changed his one of his medications after his cath to plavix. Pt states that the plavix is making him feel flu like symptoms such as aches in his body and unable to eat Thursday and Frida.  Pt states that he has a feeing of fullness.. regular bowel movements.  Requests a call back to discuss.

## 2014-06-12 NOTE — Telephone Encounter (Addendum)
Spoke with pt, plavix was started as a new meds after his lower ext angiogram. He stopped the plavix on Friday and his symptoms are completely gone. He did restart taking the pletal. His left leg is doing good. Will forward for dr arida's review. Pt agreed with this plan.

## 2014-06-12 NOTE — Telephone Encounter (Signed)
He has to be on a blood thinner for at least 1 month . Pletal thins the blood but not strong enough.  Let's try Effient 10 mg once daily. Stop Plavix and hold Pletal once he starts Effient.

## 2014-06-13 ENCOUNTER — Encounter (HOSPITAL_BASED_OUTPATIENT_CLINIC_OR_DEPARTMENT_OTHER): Payer: Self-pay | Admitting: Emergency Medicine

## 2014-06-13 ENCOUNTER — Ambulatory Visit (INDEPENDENT_AMBULATORY_CARE_PROVIDER_SITE_OTHER): Payer: 59 | Admitting: Cardiovascular Disease

## 2014-06-13 ENCOUNTER — Emergency Department (HOSPITAL_BASED_OUTPATIENT_CLINIC_OR_DEPARTMENT_OTHER)
Admission: EM | Admit: 2014-06-13 | Discharge: 2014-06-13 | Disposition: A | Discharge status: Home / Self Care | Payer: 59 | Attending: Emergency Medicine | Admitting: Emergency Medicine

## 2014-06-13 ENCOUNTER — Telehealth: Payer: Self-pay | Admitting: Cardiovascular Disease

## 2014-06-13 ENCOUNTER — Encounter: Payer: Self-pay | Admitting: Cardiovascular Disease

## 2014-06-13 VITALS — BP 124/78 | HR 99 | Ht 72.0 in | Wt 175.0 lb

## 2014-06-13 DIAGNOSIS — T783XXA Angioneurotic edema, initial encounter: Secondary | ICD-10-CM

## 2014-06-13 DIAGNOSIS — R220 Localized swelling, mass and lump, head: Secondary | ICD-10-CM

## 2014-06-13 DIAGNOSIS — T464X5A Adverse effect of angiotensin-converting-enzyme inhibitors, initial encounter: Secondary | ICD-10-CM

## 2014-06-13 DIAGNOSIS — I739 Peripheral vascular disease, unspecified: Secondary | ICD-10-CM

## 2014-06-13 MED ORDER — PREDNISONE 20 MG PO TABS: ORAL_TABLET | ORAL | Status: DC

## 2014-06-13 MED ORDER — CILOSTAZOL 50 MG PO TABS: ORAL_TABLET | ORAL | Status: DC

## 2014-06-13 MED ORDER — METHYLPREDNISOLONE SODIUM SUCC 125 MG IJ SOLR
125.0000 mg | Freq: Once | INTRAMUSCULAR | Status: AC
  Administered 2014-06-13: 125 mg via INTRAVENOUS
  Filled 2014-06-13: qty 2

## 2014-06-13 MED ORDER — PRASUGREL HCL 10 MG PO TABS: 10.0000 mg | ORAL_TABLET | Freq: Every day | ORAL | Status: DC

## 2014-06-13 MED ORDER — SODIUM CHLORIDE 0.9 % IV BOLUS (SEPSIS)
500.0000 mL | Freq: Once | INTRAVENOUS | Status: AC
  Administered 2014-06-13: 500 mL via INTRAVENOUS

## 2014-06-13 MED ORDER — PRASUGREL HCL 10 MG PO TABS: ORAL_TABLET | ORAL | Status: DC

## 2014-06-13 MED ORDER — ASPIRIN EC 81 MG PO TBEC: 81.0000 mg | DELAYED_RELEASE_TABLET | Freq: Every day | ORAL | Status: DC

## 2014-06-13 NOTE — ED Notes (Signed)
MD at bedside. 

## 2014-06-13 NOTE — Progress Notes (Signed)
Primary cardiologist: [name]  History of Present Illness: This is a very pleasant 63 year old man who is here today for a followup visit regarding peripheral arterial disease. He has known history of tobacco abuse, HTN and CAD . He was admitted to [facility] 10/29/13 with anterior STEMI and found to have moderately severe left main and LAD stenosis, moderately severe RCA stenosis. He underwent 4V CABG on 11/04/13 per Dr. Dorris FetchHendrickson (LIMA to mid LAD, SVG to OM, SVG to PDA, SVG to Diagonal). His post-operative course was uneventful. LVEF=40-45% prior to bypass.   He was seen in April for bilateral calf claudication worse on the left side.  Noninvasive evaluation showed an ABI of 0.78 on the right and 0.41 on the left with evidence of left distal SFA occlusion and significant disease in the right SFA. He was treated medically and was prescribed Pletal. Few days after he was seen, he had an accident while on the riding lawnmower which flipped. He had an 8 cm laceration on the left shin area.   I saw him recently and the wound was still not healed with evidence of cellulitis. I treated him with Keflex and referred him to the wound clinic. I proceeded with no extremity angiography which showed: 1. No significant aortoiliac disease.  2. Occluded left distal SFA into the proximal popliteal artery with 2 vessel runoff below the knee.  3. Possible distal right SFA stenosis with 1 vessel runoff below the knee.   I performed successful angioplasty and self-expanding stent placement to the left SFA with 2 Supera stents. He was started on Plavix. He did not feel well after he took Plavix due to aching, flulike symptoms and fullness in his stomach. He stopped taking the medication and symptoms resolved. On Saturday, he noticed swelling in his lips . this got worse and ultimately he went to the emergency room yesterday. He was diagnosed with ACE inhibitor-induced angioedema. He was given steroids and  antihistamine. Symptoms improved and thus he was discharged home. He reports resolution of left calf claudication.   Primary Care Physician: Dr. Earlene Plateravis ([facility])  Last Lipid Profile:Lipid Panel     Component Value Date/Time   CHOL 99 03/09/2014 1401   TRIG 47.0 03/09/2014 1401   HDL 47.50 03/09/2014 1401   CHOLHDL 2 03/09/2014 1401   VLDL 9.4 03/09/2014 1401   LDLCALC 42 03/09/2014 1401     Past Medical History  Diagnosis Date  . Hypertension   . Tobacco abuse   . Anginal pain   . Ischemic cardiomyopathy   . CAD (coronary artery disease)   . MI (myocardial infarction)   . Clotting disorder     thighs    Past Surgical History  Procedure Laterality Date  . Toe surgery    . Appendectomy    . Knee surgery      fractured patella  . Mouth surgery    . Coronary artery bypass graft N/A 11/04/2013    Procedure: CORONARY ARTERY BYPASS GRAFTING (CABG) TIMES FOUR  USING LEFT INTERNAL MAMMARY ARTERY AND RIGHT AND LEFT SAPHENOUS LEG VEIN HARVESTED ENDOSCOPICALLY;  Surgeon: Loreli SlotSteven C Hendrickson, MD;  [facility];  Service: Open Heart Surgery;  Laterality: N/A;    Current Outpatient Prescriptions  Medication Sig Dispense Refill  . ACAI PO Take 50 mg by mouth 4 (four) times daily.      Marland Kitchen. aspirin 81 MG tablet Take 1 tablet (81 mg total) by mouth daily.  30 tablet  0  . atorvastatin (LIPITOR) 80 MG tablet Take 1 tablet (80 mg total) by mouth daily at 6 PM.  90 tablet  1  . cephALEXin (KEFLEX) 500 MG capsule Take 1 capsule (500 mg total) by mouth every 8 (eight) hours.  21 capsule  0  . Coenzyme Q10 (CO Q 10 PO) Take 100 mg by mouth daily.       . diphenhydrAMINE (BENADRYL) 50 MG tablet Take 50 mg by mouth at bedtime as needed for itching.      . diphenhydrAMINE (BENYLIN) 12.5 MG/5ML syrup Take by mouth 4 (four) times daily as needed for allergies.      Marland Kitchen docusate sodium (COLACE) 250 MG capsule Take 250 mg by mouth daily.      Marland Kitchen ELTA SILVERGEL GEL Apply 1 application  topically daily.      . metoprolol tartrate (LOPRESSOR) 25 MG tablet Take 1 tablet (25 mg total) by mouth 2 (two) times daily.  180 tablet  3  . nitroGLYCERIN (NITROSTAT) 0.4 MG SL tablet Place 1 tablet (0.4 mg total) under the tongue every 5 (five) minutes as needed for chest pain.  25 tablet  6  . oxyCODONE (OXY IR/ROXICODONE) 5 MG immediate release tablet Take 5-10 mg by mouth every 6 (six) hours as needed for severe pain.      . prasugrel (EFFIENT) 10 MG TABS tablet Take one tablet by mouth daily for 35 days and then stop  30 tablet  2  . predniSONE (DELTASONE) 20 MG tablet 2 tabs po daily x 3 days  6 tablet  0  . Vitamins-Lipotropics (B-50 PO) Take 1 tablet by mouth daily.       . cilostazol (PLETAL) 50 MG tablet Take 1 tablet by mouth twice daily (resume after completing course of Effient)  60 tablet  6   No current facility-administered medications for this visit.    Allergies  Allergen Reactions  . Ace Inhibitors Swelling    Angioedema  . Dairy Aid [Lactase]   . Eggs Or Egg-Derived Products   . Peanut-Containing Drug Products     Does not know which nuts, but states nuts make him vomit    History   Social History  . Marital Status: Married    Spouse Name: N/A    Number of Children: 1  . Years of Education: N/A   Occupational History  . Photographer   Social History Main Topics  . Smoking status: Current Every Day Smoker -- 0.25 packs/day for 30 years    Types: Cigarettes  . Smokeless tobacco: Not on file  . Alcohol Use: No  . Drug Use: No  . Sexual Activity: Yes   Other Topics Concern  . Not on file   Social History Narrative  . No narrative on file    Family History  Problem Relation Age of Onset  . CAD Father 28  . AAA (abdominal aortic aneurysm) Father   . Cardiomyopathy Daughter   . Stroke Mother     Review of Systems:  As stated in the HPI and otherwise negative.   BP 124/78  Pulse 99  Ht 6' (1.829 m)  Wt 79.379 kg (175 lb)  BMI 23.73  kg/m2  Physical Examination: General: Well developed, well nourished, NAD HEENT: OP clear, mucus membranes moist SKIN: warm, dry. No rashes. Neuro: No focal deficits Musculoskeletal: Muscle strength 5/5 all ext Psychiatric: Mood and affect normal Neck: No JVD, no carotid bruits, no thyromegaly, no lymphadenopathy. Lungs:Clear bilaterally,  no wheezes, rhonci, crackles Cardiovascular: Regular rate and rhythm. No murmurs, gallops or rubs. Abdomen:Soft. Bowel sounds present. Non-tender.  Extremities: No lower extremity edema.  vascular: Femoral pulses are normal bilaterally. Posterior tibial: +2 on the right and faint on the left side. Dorsalis pedis: Absent on both sides.  There is a 6-8 cm laceration on the left shin    Assessment and Plan:   . 1. peripheral arterial disease : Status post recent stent placement to the left SFA. Left calf claudication resolved. I recommend dual antiplatelet therapy for at least one month. It is not entirely clear if he had side effects to Plavix or whether the symptoms were proceeding angioedema related to ACE inhibitor. I switched him to Effient   10 mg once daily to be taken for 30 days. He was provided with samples. After that, I asked him to stop Effient and resume Pletal.  He is going to have an ABI done tomorrow. Lower extremity arterial duplex is recommended in 3-6 months.   2. CAD: Stable s/p CABG November 2014. . Doing well. Continue ASA, statin, beta blocker.   3. Ischemic Cardiomyopathy: Echo 12/20/13 which was 6 weeks post CABG shows LVEF=35-40%.   4. Tobacco abuse:  I had a prolonged discussion with him about the importance of smoking cessation .   5. ACE inhibitor-induced angioedema: This was a delayed reaction which is unusual that can happen. Symptoms are improving. Blood pressure is controlled today and thus I did not add any medications. He has a followup appointment with Dr. Caryl AdaM. if blood pressure is elevated then, amlodipine can be  considered or a combination of long-acting nitrates/hydralazine.

## 2014-06-13 NOTE — Discharge Instructions (Signed)
If you were given medicines take as directed.  If you are on coumadin or contraceptives realize their levels and effectiveness is altered by many different medicines.  If you have any reaction (rash, tongues swelling, other) to the medicines stop taking and see a physician.   Please follow up as directed and return to the ER or see a physician for new or worsening symptoms such as breathing difficulty, tongue swelling, voice change or worsening lips swelling. Stop taking lisinopril and see your doctor in next 48 hrs to change medications.   Thank you. Filed Vitals:   06/13/14 0002 06/13/14 0340  BP: 136/76 141/77  Pulse: 76 62  Temp: 98.3 F (36.8 C) 98.3 F (36.8 C)  TempSrc:  Oral  Resp: 18 18  Height: 6' (1.829 m)   Weight: 175 lb (79.379 kg)   SpO2: 100% 98%    Angioedema Angioedema is sudden puffiness (swelling), often of the skin. It can happen:  On your face or privates (genitals).  In your belly (abdomen) or other body parts. It usually happens quickly and gets better in 1 or 2 days. It often starts at night and is found when you wake up. You may get red, itchy patches of skin (hives). Attacks can be dangerous if your breathing passages get puffy. The condition may happen only once, or it can come back at random times. It may happen for several years before it goes away for good. HOME CARE  Only take medicines as told by your doctor.  Always carry your emergency allergy medicines with you.  Wear a medical bracelet as told by your doctor.  Avoid things that you know will cause attacks (triggers). GET HELP IF:  You have another attack.  Your attacks happen more often or get worse.  The condition was passed to you by your parents and you want to have children. GET HELP RIGHT AWAY IF:   Your mouth, tongue, or lips are very puffy.  You have trouble breathing.  You have trouble swallowing.  You pass out (faint). MAKE SURE YOU:   Understand these  instructions.  Will watch your condition.  Will get help right away if you are not doing well or get worse. Document Released: 11/26/2009 Document Revised: 09/28/2013 Document Reviewed: 08/01/2013 [facility] Patient Information 2015 [place]. This information is not intended to replace advice given to you by your health care provider. Make sure you discuss any questions you have with your health care provider.

## 2014-06-13 NOTE — ED Notes (Signed)
Pt. Reports he has had  Lip swelling since 4pm on 06/12/2014. No trouble breathing or swallowing.  Pt. Has facial edema and lip edema.

## 2014-06-13 NOTE — Telephone Encounter (Signed)
Spoke with Lonnie [name] who reports he was seen in ED on 68 for facial swelling. Lisinopril stopped.  Lonnie [name] reports this AM he is feeling OK. Lips still swollen but improving. No pain, itching, shortness of breath.  He has not started Effient but is aware to stop Plavix and Pletal and start Effient. Discussed with Dr. Kirke CorinArida who would like to see Lonnie [name] in office today. Lonnie [name] will come to office this AM for appt.

## 2014-06-13 NOTE — Patient Instructions (Addendum)
Your physician wants you to follow-up in:  6 months. You will receive a reminder letter in the mail two months in advance. If you don't receive a letter, please call our office to schedule the follow-up appointment.  Your physician has recommended you make the following change in your medication:    Continue to stay off Lisinopril. No new medication for blood pressure at this time. Start Effient 10 mg by mouth daily for 35 days.  You have samples of this for 35 days. After you finish these resume Cilostazol  50 mg by mouth twice daily.  Decrease Aspirin to 81 mg by mouth daily.

## 2014-06-13 NOTE — ED Provider Notes (Signed)
CSN: [id number]     Arrival date & time 06/12/14  2357 History  This chart was scribed for Lonnie Skeens, MD by Phillis Haggis, ED Scribe. This patient was seen in room MH12/MH12 and patient care was started at 12:33 AM.     Chief Complaint  Patient presents with  . Allergic Reaction   The history is provided by the patient and the spouse. No language interpreter was used.   HPI Comments: KAEDYN [name] is a 63 y.o. male with a history of quadruple bypass surgery and HTN who presents to the Emergency Department complaining of upper and lower lip swelling onset 8 hours ago. He states that he has never had this before and is on new medication. He states that he was on a different blood thinner and is now on Plavix. He states that he has had stents put into his legs last week. He states that 4 days ago he started to feel a little different, but that it had worsened by the next day. He states that he had subtle body aches that slowed him down, had no energy, loss of appetite, with a feeling of fullness all day long. He states that the loss of appetite has since disappeared. He believed that this was a post-operation reaction. He states that it became unbearable on Saturday. He states that this occurs only when he takes the Plavix. Patient takes Lisinopril. Patient and wife deny that he has been exposed to new foods or soaps. Patient states that he goes to wound care for a past leg injury.  He denies abdominal pain, leg pain, or leg swelling and any other associated symptoms. Patient denies history of DM.   Past Medical History  Diagnosis Date  . Hypertension   . Tobacco abuse   . Anginal pain   . Ischemic cardiomyopathy   . CAD (coronary artery disease)   . MI (myocardial infarction)   . Clotting disorder     thighs   Past Surgical History  Procedure Laterality Date  . Toe surgery    . Appendectomy    . Knee surgery      fractured patella  . Mouth surgery    . Coronary artery bypass  graft N/A 11/04/2013    Procedure: CORONARY ARTERY BYPASS GRAFTING (CABG) TIMES FOUR  USING LEFT INTERNAL MAMMARY ARTERY AND RIGHT AND LEFT SAPHENOUS LEG VEIN HARVESTED ENDOSCOPICALLY;  Surgeon: Loreli Slot, MD;  Location: [facility] OR;  Service: Open Heart Surgery;  Laterality: N/A;   Family History  Problem Relation Age of Onset  . CAD Father 65  . AAA (abdominal aortic aneurysm) Father   . Cardiomyopathy Daughter   . Stroke Mother    History  Substance Use Topics  . Smoking status: Current Every Day Smoker -- 0.25 packs/day for 30 years    Types: Cigarettes  . Smokeless tobacco: Not on file  . Alcohol Use: No    Review of Systems  Constitutional: Negative for fever and chills.  HENT: Positive for facial swelling (upper and lower lip).   Eyes: Negative for visual disturbance.  Cardiovascular: Negative for chest pain and leg swelling.  Gastrointestinal: Negative for nausea and abdominal pain.  Musculoskeletal: Negative for arthralgias.  Skin: Positive for wound (surgical left leg).  Neurological: Negative for headaches.  All other systems reviewed and are negative.     Allergies  Dairy aid; Eggs or egg-derived products; and Peanut-containing drug products  Home Medications   Prior to Admission medications  Medication Sig Start Date End Date Taking? Authorizing Provider  diphenhydrAMINE (BENADRYL) 50 MG tablet Take 50 mg by mouth at bedtime as needed for itching.   Yes Historical Provider, MD  diphenhydrAMINE (BENYLIN) 12.5 MG/5ML syrup Take by mouth 4 (four) times daily as needed for allergies.   Yes Historical Provider, MD  ACAI PO Take 50 mg by mouth 4 (four) times daily.    Historical Provider, MD  aspirin EC 325 MG EC tablet Take 1 tablet (325 mg total) by mouth daily. 11/08/13   Erin Barrett, PA-C  atorvastatin (LIPITOR) 80 MG tablet Take 1 tablet (80 mg total) by mouth daily at 6 PM. 02/06/14   Kathleene Hazelhristopher D McAlhany, MD  cephALEXin (KEFLEX) 500 MG capsule Take 1  capsule (500 mg total) by mouth every 8 (eight) hours. 05/30/14   [place] OuchMuhammad A Arida, MD  Coenzyme Q10 (CO Q 10 PO) Take 100 mg by mouth daily.     Historical Provider, MD  docusate sodium (COLACE) 250 MG capsule Take 250 mg by mouth daily.    Historical Provider, MD  ELTA SILVERGEL GEL Apply 1 application topically daily.    Historical Provider, MD  lisinopril-hydrochlorothiazide (PRINZIDE,ZESTORETIC) 10-12.5 MG per tablet Take 1 tablet by mouth  every morning    Kathleene Hazelhristopher D McAlhany, MD  metoprolol tartrate (LOPRESSOR) 25 MG tablet Take 1 tablet (25 mg total) by mouth 2 (two) times daily. 12/07/13   Kathleene Hazelhristopher D McAlhany, MD  nitroGLYCERIN (NITROSTAT) 0.4 MG SL tablet Place 1 tablet (0.4 mg total) under the tongue every 5 (five) minutes as needed for chest pain. 02/28/14   Kathleene Hazelhristopher D McAlhany, MD  oxyCODONE (OXY IR/ROXICODONE) 5 MG immediate release tablet Take 5-10 mg by mouth every 6 (six) hours as needed for severe pain.    Historical Provider, MD  prasugrel (EFFIENT) 10 MG TABS tablet Take 1 tablet (10 mg total) by mouth daily. 06/12/14   [place] OuchMuhammad A Arida, MD  Vitamins-Lipotropics (B-50 PO) Take 1 tablet by mouth daily.     Historical Provider, MD   BP 136/76  Pulse 76  Temp(Src) 98.3 F (36.8 C)  Resp 18  Ht 6' (1.829 m)  Wt 175 lb (79.379 kg)  BMI 23.73 kg/m2  SpO2 100% Physical Exam  Nursing note and vitals reviewed. Constitutional: He is oriented to person, place, and time. He appears well-developed and well-nourished.  HENT:  Head: Normocephalic and atraumatic.  Upper and lower lip swelling No tongue swelling Overall symmetric mild lower facial  edema  Eyes: Conjunctivae and EOM are normal. Pupils are equal, round, and reactive to light.  Neck: Normal range of motion. Neck supple.  Cardiovascular: Normal rate, regular rhythm and normal heart sounds.   Pulmonary/Chest: Effort normal and breath sounds normal. No stridor.  Anterior lung fields clear  Abdominal: Soft. There is  no tenderness.  Musculoskeletal: Normal range of motion. He exhibits edema.  Left anterior tibia mild gaping approximately 10 cm width, 8 cm length. Appears to be healing well. No tenderness. No spreading erythema, streaking, drainage or leakage.   Neurological: He is alert and oriented to person, place, and time.  Skin: Skin is warm and dry. There is erythema (facial ).  Psychiatric: He has a normal mood and affect. His behavior is normal.    ED Course  Procedures (including critical care time) DIAGNOSTIC STUDIES: Oxygen Saturation is 100% on room air, normal by my interpretation.    COORDINATION OF CARE: 12:41 AM-Discussed treatment plan which includes extended stay, IV fluids, steroids  with pt at bedside and pt agreed to plan.     Labs Review Labs Reviewed - No data to display  Imaging Review No results found.   EKG Interpretation None      MDM   Final diagnoses:  ACE inhibitor-aggravated angioedema, initial encounter  Lip swelling    Patient presents with likely lisinopril-induced angioedema. Swelling located to the lips and no posterior pharyngeal edema or breathing difficulties. Patient observed in the ER and multiple rechecks with gradual improvement. Discussed continued observation versus close followup at home and outpatient. Patient prefers to go home and will return for worsening symptoms. Discussed stopping his lisinopril medication and seeing his doctor next 48 hours. Patient given Solu-Medrol IV fluids in ER.  Results and differential diagnosis were discussed with the patient/parent/guardian. Close follow up outpatient was discussed, comfortable with the plan.   Medications  methylPREDNISolone sodium succinate (SOLU-MEDROL) 125 mg/2 mL injection 125 mg (125 mg Intravenous Given 06/13/14 0202)  sodium chloride 0.9 % bolus 500 mL (0 mLs Intravenous Stopped 06/13/14 0352)    Filed Vitals:   06/13/14 0002 06/13/14 0340  BP: 136/76 141/77  Pulse: 76 62  Temp:  98.3 F (36.8 C) 98.3 F (36.8 C)  TempSrc:  Oral  Resp: 18 18  Height: 6' (1.829 m)   Weight: 175 lb (79.379 kg)   SpO2: 100% 98%      Lonnie SkeensJoshua M Nikko Quast, MD 06/13/14 [phone]

## 2014-06-13 NOTE — Telephone Encounter (Signed)
Patient was given Lisinopril to take. He was taken last night to [facility] on [address] due to allergic reaction. He was given prednisone to take. Wants to know what he can take in the place of lisinopril. Please call and advise.

## 2014-06-14 ENCOUNTER — Ambulatory Visit (HOSPITAL_COMMUNITY): Payer: 59 | Attending: Cardiology | Admitting: *Deleted

## 2014-06-14 DIAGNOSIS — E785 Hyperlipidemia, unspecified: Secondary | ICD-10-CM | POA: Insufficient documentation

## 2014-06-14 DIAGNOSIS — Z951 Presence of aortocoronary bypass graft: Secondary | ICD-10-CM | POA: Insufficient documentation

## 2014-06-14 DIAGNOSIS — F17200 Nicotine dependence, unspecified, uncomplicated: Secondary | ICD-10-CM | POA: Insufficient documentation

## 2014-06-14 DIAGNOSIS — I739 Peripheral vascular disease, unspecified: Secondary | ICD-10-CM

## 2014-06-14 DIAGNOSIS — Z09 Encounter for follow-up examination after completed treatment for conditions other than malignant neoplasm: Secondary | ICD-10-CM | POA: Insufficient documentation

## 2014-06-14 DIAGNOSIS — I10 Essential (primary) hypertension: Secondary | ICD-10-CM | POA: Insufficient documentation

## 2014-06-14 DIAGNOSIS — I2510 Atherosclerotic heart disease of native coronary artery without angina pectoris: Secondary | ICD-10-CM | POA: Insufficient documentation

## 2014-06-14 NOTE — Progress Notes (Signed)
ABI Complete 

## 2014-06-16 ENCOUNTER — Telehealth: Payer: Self-pay | Admitting: Cardiovascular Disease

## 2014-06-16 NOTE — Telephone Encounter (Signed)
Lower Arterial Examination preliminary results given. Pt is aware that Dr. Kirke CorinArida needs to review test and make recommendations if needed. Pt also would like to know if he can go back to do the daily activities. HE WOULD LIKE TO KNOW ABOUT  HIS LIMITATIONS OF WHAT HE CAN DO IN THE GYM. Pt is aware that this message will be send to Dr. Kirke CorinArida for recommendations.

## 2014-06-16 NOTE — Telephone Encounter (Signed)
ABI improved to normal after stent placement. He can resume activities and cardiac rehab without restrictions.

## 2014-06-16 NOTE — Telephone Encounter (Signed)
New message    Wants to do daily activities & go gym can he do that.     Test results ultrasound.

## 2014-06-16 NOTE — Telephone Encounter (Signed)
Left pt a message to call back. 

## 2014-06-18 ENCOUNTER — Other Ambulatory Visit: Payer: Self-pay | Admitting: Cardiovascular Disease

## 2014-06-19 NOTE — Telephone Encounter (Signed)
Pt aware of Dr Jari SportsmanArida's comments.

## 2014-06-27 ENCOUNTER — Ambulatory Visit: Payer: 59 | Admitting: Cardiovascular Disease

## 2014-06-30 ENCOUNTER — Ambulatory Visit (INDEPENDENT_AMBULATORY_CARE_PROVIDER_SITE_OTHER): Payer: 59 | Admitting: Cardiovascular Disease

## 2014-06-30 ENCOUNTER — Encounter: Payer: Self-pay | Admitting: Cardiovascular Disease

## 2014-06-30 VITALS — BP 140/84 | HR 60 | Ht 72.0 in | Wt 176.0 lb

## 2014-06-30 DIAGNOSIS — I739 Peripheral vascular disease, unspecified: Secondary | ICD-10-CM

## 2014-06-30 DIAGNOSIS — I2589 Other forms of chronic ischemic heart disease: Secondary | ICD-10-CM

## 2014-06-30 DIAGNOSIS — I2510 Atherosclerotic heart disease of native coronary artery without angina pectoris: Secondary | ICD-10-CM

## 2014-06-30 DIAGNOSIS — F17200 Nicotine dependence, unspecified, uncomplicated: Secondary | ICD-10-CM

## 2014-06-30 DIAGNOSIS — I255 Ischemic cardiomyopathy: Secondary | ICD-10-CM

## 2014-06-30 DIAGNOSIS — Z720 Tobacco use: Secondary | ICD-10-CM

## 2014-06-30 DIAGNOSIS — I10 Essential (primary) hypertension: Secondary | ICD-10-CM

## 2014-06-30 MED ORDER — ASPIRIN EC 81 MG PO TBEC: 81.0000 mg | DELAYED_RELEASE_TABLET | Freq: Every day | ORAL | Status: DC

## 2014-06-30 MED ORDER — IRBESARTAN 150 MG PO TABS: 150.0000 mg | ORAL_TABLET | Freq: Every day | ORAL | Status: DC

## 2014-06-30 MED ORDER — CILOSTAZOL 50 MG PO TABS: ORAL_TABLET | ORAL | Status: DC

## 2014-06-30 NOTE — Patient Instructions (Addendum)
Your physician wants you to follow-up in: 6 months.    You will receive a reminder letter in the mail two months in advance. If you don't receive a letter, please call our office to schedule the follow-up appointment.  Your physician has recommended you make the following change in your medication:  Decrease aspirin to 81 mg by mouth daily.  Start Avapro 150 mg by mouth daily.

## 2014-06-30 NOTE — Progress Notes (Signed)
History of Present Illness: 63 yo male with history of tobacco abuse, HTN and CAD who is here today for cardiac follow up. He was admitted to [facility] 10/29/13 with anterior STEMI and found to have moderately severe left main and LAD stenosis, moderately severe RCA stenosis. I did not initially see a culprit vessel. He continued to have angina and repeat cath on 10/31/13 with IVUS of the left main artery showed significant disease. He underwent 4V CABG on 11/04/13 per Dr. Dorris FetchHendrickson (LIMA to mid LAD, SVG to OM, SVG to PDA, SVG to Diagonal). His post-operative course was uneventful. LVEF=40-45% prior to bypass. Left SFA stent per Dr. Kirke CorinArida. Did not tolerate Plavix and now on Effient. Ace-inh induced angioedema on 06/13/14.   He is here today for follow up. No chest pain or SOB. No palpitations. Still smoking 2 cigarettes per day. His legs feel better.   Primary Care Physician: Dr. Earlene Plateravis ([facility])  Last Lipid Profile:Lipid Panel     Component Value Date/Time   CHOL 99 03/09/2014 1401   TRIG 47.0 03/09/2014 1401   HDL 47.50 03/09/2014 1401   CHOLHDL 2 03/09/2014 1401   VLDL 9.4 03/09/2014 1401   LDLCALC 42 03/09/2014 1401     Past Medical History  Diagnosis Date  . Hypertension   . Tobacco abuse   . Anginal pain   . Ischemic cardiomyopathy   . CAD (coronary artery disease)   . MI (myocardial infarction)   . Clotting disorder     thighs    Past Surgical History  Procedure Laterality Date  . Toe surgery    . Appendectomy    . Knee surgery      fractured patella  . Mouth surgery    . Coronary artery bypass graft N/A 11/04/2013    Procedure: CORONARY ARTERY BYPASS GRAFTING (CABG) TIMES FOUR  USING LEFT INTERNAL MAMMARY ARTERY AND RIGHT AND LEFT SAPHENOUS LEG VEIN HARVESTED ENDOSCOPICALLY;  Surgeon: Loreli SlotSteven C Hendrickson, MD;  Location: [facility] OR;  Service: Open Heart Surgery;  Laterality: N/A;    Current Outpatient Prescriptions  Medication Sig Dispense Refill  .  ACAI PO Take 50 mg by mouth 4 (four) times daily.      Marland Kitchen. aspirin 325 MG tablet Take 325 mg by mouth daily. Takes 325 until effient is done and then 81 mg after that      . atorvastatin (LIPITOR) 80 MG tablet Take 1 tablet (80 mg total) by mouth daily at 6 PM.  90 tablet  0  . cilostazol (PLETAL) 50 MG tablet Take 1 tablet by mouth twice daily (resume after completing course of Effient)  60 tablet  6  . Coenzyme Q10 (CO Q 10 PO) Take 100 mg by mouth daily.       . diphenhydrAMINE (BENADRYL) 50 MG tablet Take 50 mg by mouth at bedtime as needed for itching.      . docusate sodium (COLACE) 250 MG capsule Take 250 mg by mouth daily.      Marland Kitchen. ELTA SILVERGEL GEL Apply 1 application topically daily.      . metoprolol tartrate (LOPRESSOR) 25 MG tablet Take 1 tablet (25 mg total) by mouth 2 (two) times daily.  180 tablet  3  . nitroGLYCERIN (NITROSTAT) 0.4 MG SL tablet Place 1 tablet (0.4 mg total) under the tongue every 5 (five) minutes as needed for chest pain.  25 tablet  6  . oxyCODONE (OXY IR/ROXICODONE) 5 MG immediate release tablet Take 5-10  mg by mouth every 6 (six) hours as needed for severe pain.      . prasugrel (EFFIENT) 10 MG TABS tablet Take one tablet by mouth daily for 35 days and then stop  30 tablet  2  . predniSONE (DELTASONE) 20 MG tablet 2 tabs po daily x 3 days  6 tablet  0  . Vitamins-Lipotropics (B-50 PO) Take 1 tablet by mouth daily.        No current facility-administered medications for this visit.    Allergies  Allergen Reactions  . Ace Inhibitors Swelling    Angioedema  . Dairy Aid [Lactase]   . Eggs Or Egg-Derived Products   . Peanut-Containing Drug Products     Does not know which nuts, but states nuts make him vomit    History   Social History  . Marital Status: Married    Spouse Name: N/A    Number of Children: 1  . Years of Education: N/A   Occupational History  . Photographer   Social History Main Topics  . Smoking status: Current Every Day  Smoker -- 0.25 packs/day for 30 years    Types: Cigarettes  . Smokeless tobacco: Not on file  . Alcohol Use: No  . Drug Use: No  . Sexual Activity: Yes   Other Topics Concern  . Not on file   Social History Narrative  . No narrative on file    Family History  Problem Relation Age of Onset  . CAD Father 68  . AAA (abdominal aortic aneurysm) Father   . Cardiomyopathy Daughter   . Stroke Mother     Review of Systems:  As stated in the HPI and otherwise negative.   BP 140/84  Pulse 60  Ht 6' (1.829 m)  Wt 176 lb (79.833 kg)  BMI 23.86 kg/m2  Physical Examination: General: Well developed, well nourished, NAD HEENT: OP clear, mucus membranes moist SKIN: warm, dry. No rashes. Neuro: No focal deficits Musculoskeletal: Muscle strength 5/5 all ext Psychiatric: Mood and affect normal Neck: No JVD, no carotid bruits, no thyromegaly, no lymphadenopathy. Lungs:Clear bilaterally, no wheezes, rhonci, crackles Cardiovascular: Regular rate and rhythm. No murmurs, gallops or rubs. Abdomen:Soft. Bowel sounds present. Non-tender.  Extremities: No lower extremity edema. Pulses are 2 + in the right DP/PT and trace left DP/PT.   Echo 12/20/13: Left ventricle: Diffuse hypokinesis with abnormal septal motion EF somewhat hard to judge as patient had frequent bigemminy The cavity size was moderately dilated. Wall thickness was increased in a pattern of mild LVH. Systolic function was moderately reduced. The estimated ejection fraction was in the range of 35% to 40%. - Left atrium: The atrium was mildly dilated. - Atrial septum: No defect or patent foramen ovale was identified. - Pulmonary arteries: PA peak pressure: 31mm Hg (S).  Assessment and Plan:   1. CAD: Stable s/p CABG November 2014. . Doing well. Continue ASA, statin, beta blocker. Add ARB. Lower ASA to 81 mg daily.   2. Ischemic Cardiomyopathy: Echo 12/20/13 which was 6 weeks post CABG shows LVEF=35-40%. Will continue current  meds. Since he had angioedema presumed to be due to Lisinopril, will start Avapro 150 mg Qdaily.   3. Tobacco abuse: Complete cessation advised.   4. HTN: BP well controlled.   5. PAD: Followed in [facility] by Dr. Kirke Corin. Will finish Effient soon (30 day course) and will then start Pletal.

## 2014-07-14 ENCOUNTER — Telehealth: Payer: Self-pay | Admitting: Cardiovascular Disease

## 2014-07-14 NOTE — Telephone Encounter (Signed)
[facility] Statement Completed By Dr.McAlhany, Pt aware  [facility] 7.24.15/km

## 2014-08-01 ENCOUNTER — Ambulatory Visit: Payer: 59 | Admitting: Family Medicine

## 2014-08-30 ENCOUNTER — Other Ambulatory Visit: Payer: Self-pay | Admitting: Cardiovascular Disease

## 2014-09-01 ENCOUNTER — Encounter: Payer: Self-pay | Admitting: Internal Medicine

## 2014-09-01 ENCOUNTER — Ambulatory Visit (HOSPITAL_BASED_OUTPATIENT_CLINIC_OR_DEPARTMENT_OTHER)
Admission: RE | Admit: 2014-09-01 | Discharge: 2014-09-01 | Disposition: A | Discharge status: Home / Self Care | Payer: 59 | Source: Ambulatory Visit | Attending: Internal Medicine | Admitting: Internal Medicine

## 2014-09-01 ENCOUNTER — Ambulatory Visit (INDEPENDENT_AMBULATORY_CARE_PROVIDER_SITE_OTHER): Payer: 59 | Admitting: Internal Medicine

## 2014-09-01 VITALS — BP 132/78 | HR 54 | Temp 99.0°F | Ht 73.0 in | Wt 182.4 lb

## 2014-09-01 DIAGNOSIS — R739 Hyperglycemia, unspecified: Secondary | ICD-10-CM | POA: Insufficient documentation

## 2014-09-01 DIAGNOSIS — R7303 Prediabetes: Secondary | ICD-10-CM

## 2014-09-01 DIAGNOSIS — M7989 Other specified soft tissue disorders: Secondary | ICD-10-CM | POA: Insufficient documentation

## 2014-09-01 DIAGNOSIS — M19072 Primary osteoarthritis, left ankle and foot: Secondary | ICD-10-CM

## 2014-09-01 DIAGNOSIS — Z0000 Encounter for general adult medical examination without abnormal findings: Secondary | ICD-10-CM | POA: Insufficient documentation

## 2014-09-01 DIAGNOSIS — M1990 Unspecified osteoarthritis, unspecified site: Secondary | ICD-10-CM | POA: Insufficient documentation

## 2014-09-01 DIAGNOSIS — F17200 Nicotine dependence, unspecified, uncomplicated: Secondary | ICD-10-CM

## 2014-09-01 DIAGNOSIS — M19079 Primary osteoarthritis, unspecified ankle and foot: Secondary | ICD-10-CM

## 2014-09-01 DIAGNOSIS — R7309 Other abnormal glucose: Secondary | ICD-10-CM

## 2014-09-01 DIAGNOSIS — I10 Essential (primary) hypertension: Secondary | ICD-10-CM

## 2014-09-01 DIAGNOSIS — Z23 Encounter for immunization: Secondary | ICD-10-CM

## 2014-09-01 DIAGNOSIS — Z720 Tobacco use: Secondary | ICD-10-CM

## 2014-09-01 HISTORY — DX: Prediabetes: R73.03

## 2014-09-01 LAB — HEMOGLOBIN A1C
Hgb A1c MFr Bld: 6.3 % — ABNORMAL HIGH (ref <5.7)
Mean Plasma Glucose: 134 mg/dL — ABNORMAL HIGH (ref <117)

## 2014-09-01 IMAGING — CR DG ANKLE COMPLETE 3+V*L*
2 series · 2 of 2 positions shown · non-contrast
Comparison: Left tibia and fibular radiographs- [DATE]

CLINICAL DATA: Left ankle swelling.  No known injury.

EXAM:
LEFT ANKLE COMPLETE - 3+ VIEW

[t ankle joint ap left]
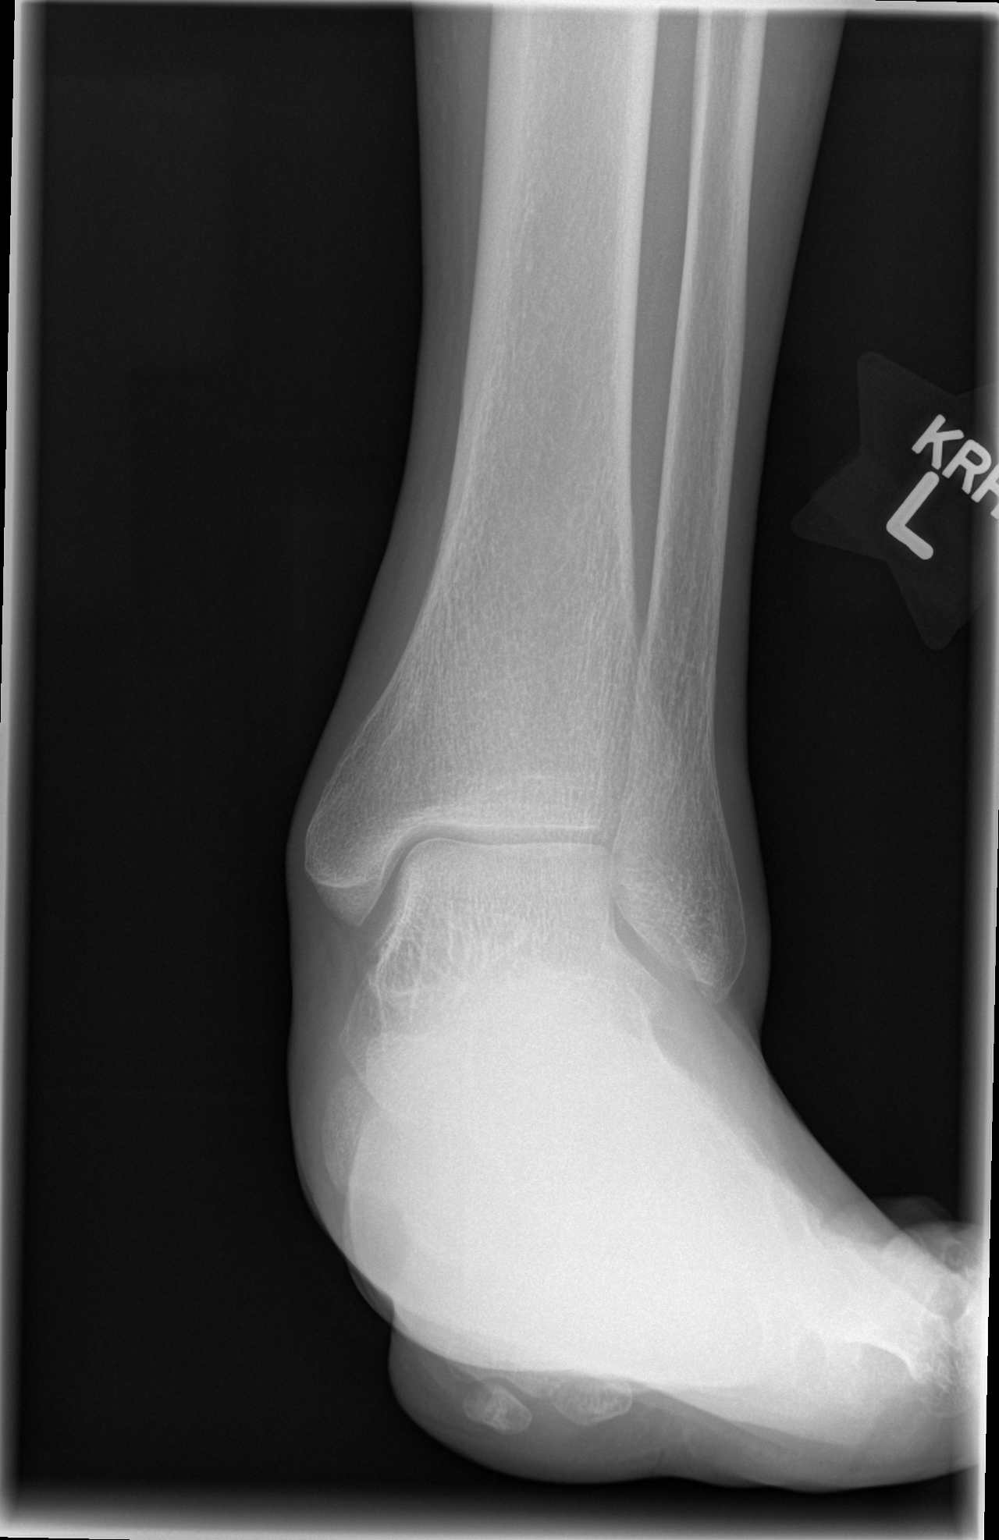

[t ankle joint lat left]
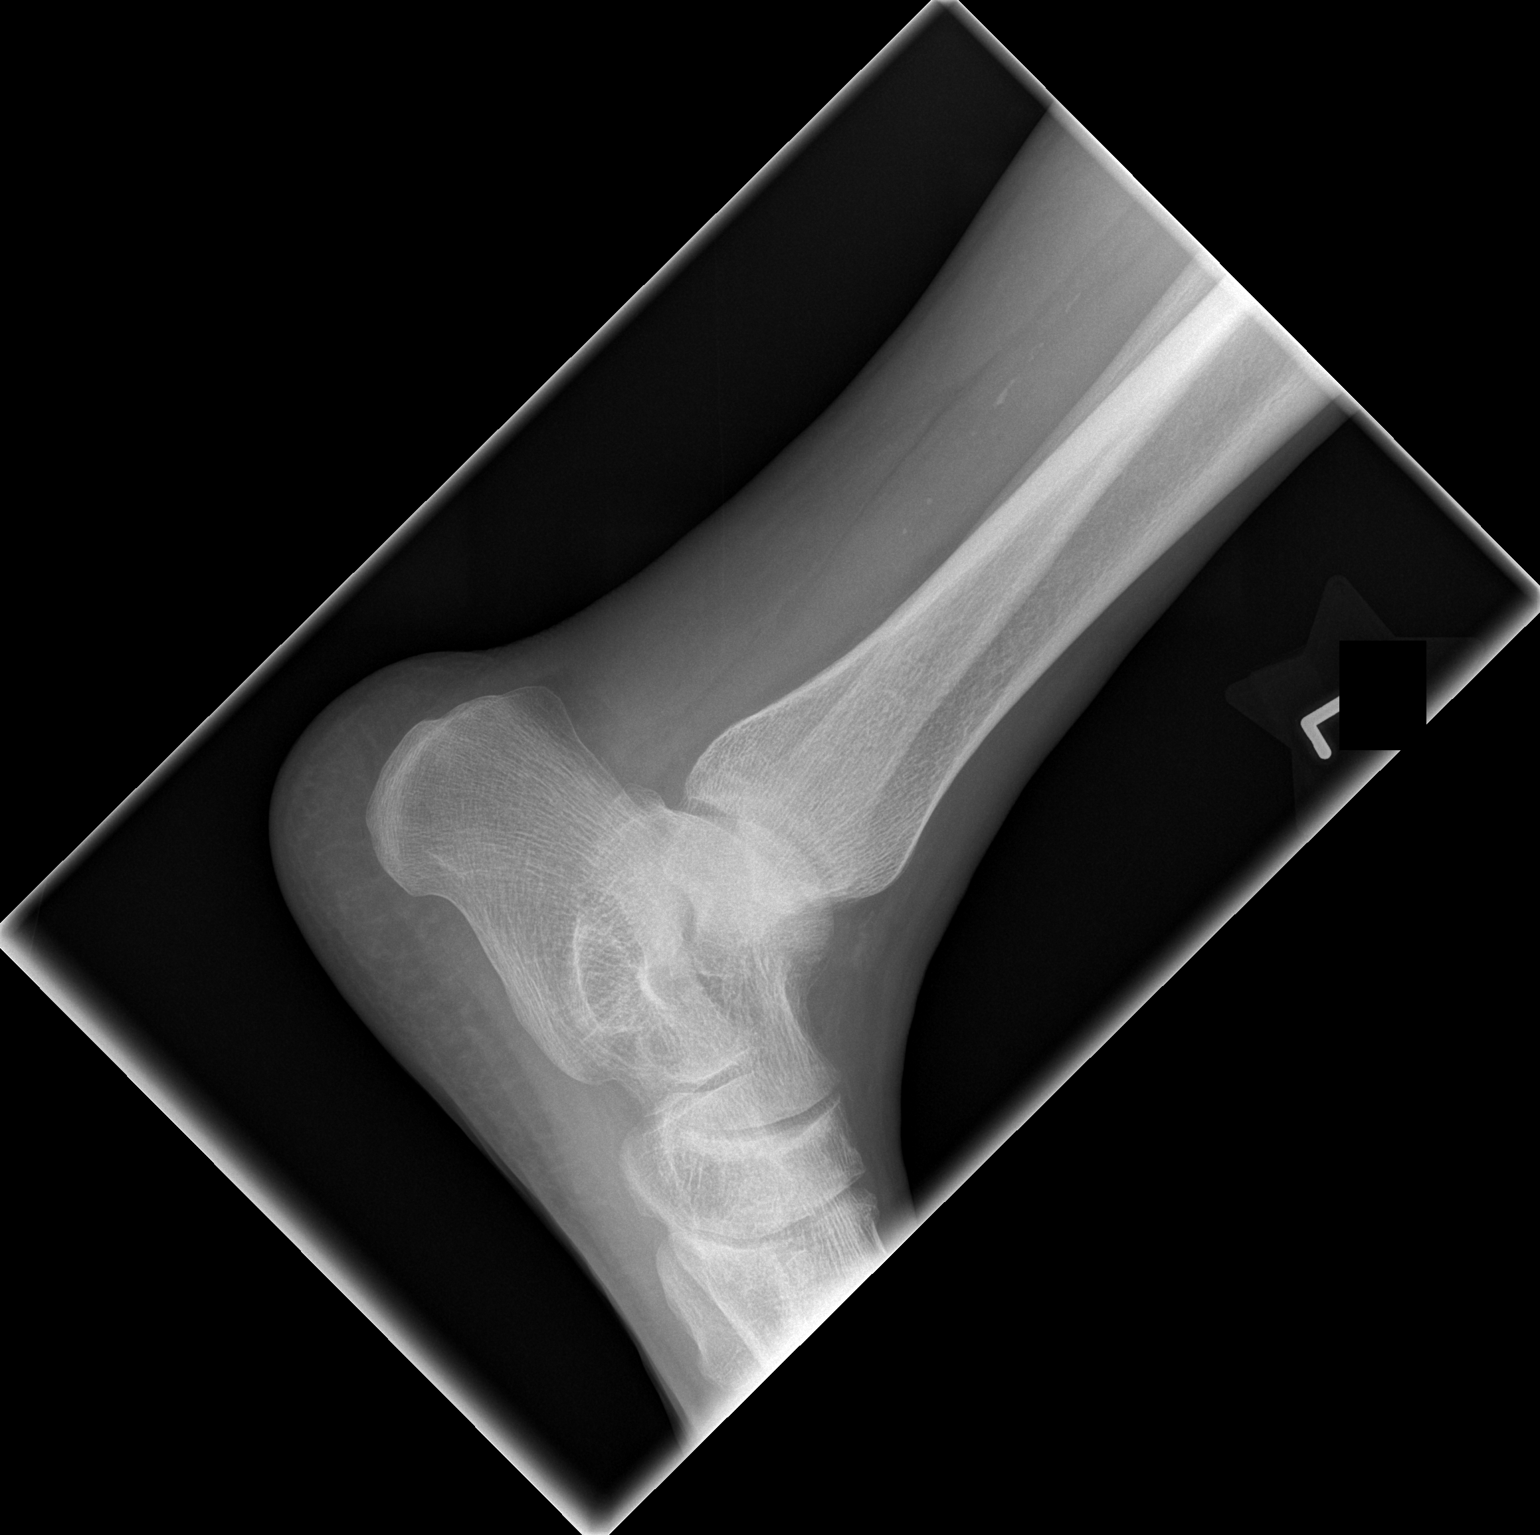

[2 of 2 positions shown; findings below may reference images not displayed]

FINDINGS: No fracture or dislocation. Joint spaces are preserved. The ankle
mortise is preserved. No ankle joint effusion. Scattered vascular
calcifications within the distal tibial arteries. Regional soft
tissues appear otherwise normal. No radiopaque foreign body.
IMPRESSION: No acute findings.

## 2014-09-01 MED ORDER — OSELTAMIVIR PHOSPHATE 75 MG PO CAPS: 75.0000 mg | ORAL_CAPSULE | Freq: Every day | ORAL | Status: DC

## 2014-09-01 MED ORDER — BUPROPION HCL 100 MG PO TABS: 100.0000 mg | ORAL_TABLET | Freq: Two times a day (BID) | ORAL | Status: DC

## 2014-09-01 NOTE — Assessment & Plan Note (Addendum)
He tried Chantix before, it increased his desire to smoke. We talk about Wellbutrin, he is willing to try. See instructions

## 2014-09-01 NOTE — Progress Notes (Signed)
Pre visit review using our clinic review tool, if applicable. No additional management support is needed unless otherwise documented below in the visit note. 

## 2014-09-01 NOTE — Patient Instructions (Signed)
Get your blood work before you leave   Stop by the first floor and get the XR   If you are exposed to the flu,  start taking Tamiflu 1 tablet daily for 10 days to prevent influenza  Start Wellbutrin 2 weeks before you decide to stop smoking (the first  week take only one tablet a day, after that take one tablet twice a day) Okay to use a nicotine supplement after you quit tobacco   Please come back to the office in 4 months  for a   physical exam. Come back fasting       Smoking Cessation Quitting smoking is important to your health and has many advantages. However, it is not always easy to quit since nicotine is a very addictive drug. Oftentimes, people try 3 times or more before being able to quit. This document explains the best ways for you to prepare to quit smoking. Quitting takes hard work and a lot of effort, but you can do it. ADVANTAGES OF QUITTING SMOKING  You will live longer, feel better, and live better.  Your body will feel the impact of quitting smoking almost immediately.  Within 20 minutes, blood pressure decreases. Your pulse returns to its normal level.  After 8 hours, carbon monoxide levels in the blood return to normal. Your oxygen level increases.  After 24 hours, the chance of having a heart attack starts to decrease. Your breath, hair, and body stop smelling like smoke.  After 48 hours, damaged nerve endings begin to recover. Your sense of taste and smell improve.  After 72 hours, the body is virtually free of nicotine. Your bronchial tubes relax and breathing becomes easier.  After 2 to 12 weeks, lungs can hold more air. Exercise becomes easier and circulation improves.  The risk of having a heart attack, stroke, cancer, or lung disease is greatly reduced.  After 1 year, the risk of coronary heart disease is cut in half.  After 5 years, the risk of stroke falls to the same as a nonsmoker.  After 10 years, the risk of lung cancer is cut in half and the  risk of other cancers decreases significantly.  After 15 years, the risk of coronary heart disease drops, usually to the level of a nonsmoker.  If you are pregnant, quitting smoking will improve your chances of having a healthy baby.  The people you live with, especially any children, will be healthier.  You will have extra money to spend on things other than cigarettes. QUESTIONS TO THINK ABOUT BEFORE ATTEMPTING TO QUIT You may want to talk about your answers with your health care provider.  Why do you want to quit?  If you tried to quit in the past, what helped and what did not?  What will be the most difficult situations for you after you quit? How will you plan to handle them?  Who can help you through the tough times? Your family? Friends? A health care provider?  What pleasures do you get from smoking? What ways can you still get pleasure if you quit? Here are some questions to ask your health care provider:  How can you help me to be successful at quitting?  What medicine do you think would be best for me and how should I take it?  What should I do if I need more help?  What is smoking withdrawal like? How can I get information on withdrawal? GET READY  Set a quit date.  Change your  environment by getting rid of all cigarettes, ashtrays, matches, and lighters in your home, car, or work. Do not let people smoke in your home.  Review your past attempts to quit. Think about what worked and what did not. GET SUPPORT AND ENCOURAGEMENT You have a better chance of being successful if you have help. You can get support in many ways.  Tell your family, friends, and coworkers that you are going to quit and need their support. Ask them not to smoke around you.  Get individual, group, or telephone counseling and support. Programs are available at [employer] and health centers. Call your local health department for information about programs in your area.  Spiritual beliefs  and practices may help some smokers quit.  Download a "quit meter" on your computer to keep track of quit statistics, such as how long you have gone without smoking, cigarettes not smoked, and money saved.  Get a self-help book about quitting smoking and staying off tobacco. LEARN NEW SKILLS AND BEHAVIORS  Distract yourself from urges to smoke. Talk to someone, go for a walk, or occupy your time with a task.  Change your normal routine. Take a different route to work. Drink tea instead of coffee. Eat breakfast in a different place.  Reduce your stress. Take a hot bath, exercise, or read a book.  Plan something enjoyable to do every day. Reward yourself for not smoking.  Explore interactive web-based programs that specialize in helping you quit. GET MEDICINE AND USE IT CORRECTLY Medicines can help you stop smoking and decrease the urge to smoke. Combining medicine with the above behavioral methods and support can greatly increase your chances of successfully quitting smoking.  Nicotine replacement therapy helps deliver nicotine to your body without the negative effects and risks of smoking. Nicotine replacement therapy includes nicotine gum, lozenges, inhalers, nasal sprays, and skin patches. Some may be available over-the-counter and others require a prescription.  Antidepressant medicine helps people abstain from smoking, but how this works is unknown. This medicine is available by prescription.  Nicotinic receptor partial agonist medicine simulates the effect of nicotine in your brain. This medicine is available by prescription. Ask your health care provider for advice about which medicines to use and how to use them based on your health history. Your health care provider will tell you what side effects to look out for if you choose to be on a medicine or therapy. Carefully read the information on the package. Do not use any other product containing nicotine while using a nicotine  replacement product.  RELAPSE OR DIFFICULT SITUATIONS Most relapses occur within the first 3 months after quitting. Do not be discouraged if you start smoking again. Remember, most people try several times before finally quitting. You may have symptoms of withdrawal because your body is used to nicotine. You may crave cigarettes, be irritable, feel very hungry, cough often, get headaches, or have difficulty concentrating. The withdrawal symptoms are only temporary. They are strongest when you first quit, but they will go away within 10-14 days. To reduce the chances of relapse, try to:  Avoid drinking alcohol. Drinking lowers your chances of successfully quitting.  Reduce the amount of caffeine you consume. Once you quit smoking, the amount of caffeine in your body increases and can give you symptoms, such as a rapid heartbeat, sweating, and anxiety.  Avoid smokers because they can make you want to smoke.  Do not let weight gain distract you. Many smokers will gain weight when  they quit, usually less than 10 pounds. Eat a healthy diet and stay active. You can always lose the weight gained after you quit.  Find ways to improve your mood other than smoking. FOR MORE INFORMATION  www.smokefree.gov  Document Released: 12/02/2001 Document Revised: 04/24/2014 Document Reviewed: 03/18/2012 [facility] Patient Information 2015 [place], [place]. This information is not intended to replace advice given to you by your health care provider. Make sure you discuss any questions you have with your health care provider.

## 2014-09-01 NOTE — Assessment & Plan Note (Signed)
Well controlled, continue with present care

## 2014-09-01 NOTE — Assessment & Plan Note (Signed)
Last A1c 5.9, recommend to continue with his healthy diet and remain as active as possible. Recheck the A1c

## 2014-09-01 NOTE — Assessment & Plan Note (Signed)
Wonders about the flu shot, he has an "allergy" to eggs but because he is able to eat baked goods w/ eggs w/o problems It would be safe to provide him the shot but on further questioning, many years ago he got the flu shot and within 24 hours have a severe flulike syndrome. At this point recommend to avoid  the flu shot. A prescription for Tamiflu provided in case is exposed to the flu Pneumonia shot provided Return to the office in 4  months

## 2014-09-01 NOTE — Assessment & Plan Note (Signed)
Complaining of left ankle swelling, no actual edema on exam, range of motion is limited, I suspect DJD. There is no major problem with pain consequently recommend observation and a x-ray

## 2014-09-01 NOTE — Progress Notes (Signed)
Subjective:    Patient ID: Lonnie [name], male    DOB: [birth year], 63 y.o.   MRN: [id number]  DOS:  09/01/2014 Type of visit - description : new pt, to get established In general feeling well. CAD, recovering from a CABG last year   History of peripheral vascular disease, closely f/u by cardiology High cholesterol, good compliance of medications. Complaining of "swelling" of the left ankle, no pain. Tobacco abuse, still smoking, less than half pack a day. Previously tried Chantix but it paradoxically increased to desire to smoke.  options? Also wonders about a flu shot. He has a history of "egg allergies". Unable to eat eggs because nausea however he is able to eat cake that contains baked eggs.. Never had a serious reaction like lips  or tongue swelling or rash Labs reviewed, due for a A1c  ROS Denies chest pain or difficulty breathing. No pretibial edema No nausea, vomiting, diarrhea or blood in the stools.   Past Medical History  Diagnosis Date  . Hypertension   . Tobacco abuse   . Ischemic cardiomyopathy   . CAD (coronary artery disease)   . MI (myocardial infarction)   . PVD (peripheral vascular disease)     thighs  . Allergy     Past Surgical History  Procedure Laterality Date  . Toe surgery    . Appendectomy    . Knee surgery      fractured patella  . Mouth surgery    . Coronary artery bypass graft N/A 11/04/2013    Procedure: CORONARY ARTERY BYPASS GRAFTING (CABG) TIMES FOUR  USING LEFT INTERNAL MAMMARY ARTERY AND RIGHT AND LEFT SAPHENOUS LEG VEIN HARVESTED ENDOSCOPICALLY;  Surgeon: Loreli Slot, MD;  Location: [facility] OR;  Service: Open Heart Surgery;  Laterality: N/A;    History   Social History  . Marital Status: Married    Spouse Name: [name]    Number of Children: 1  . Years of Education: N/A   Occupational History  . Photographer  . [employer]   Social History Main Topics  . Smoking status: Current Every Day Smoker --  0.25 packs/day for 30 years    Types: Cigarettes  . Smokeless tobacco: Not on file     Comment: < 1/2 ppd  . Alcohol Use: No  . Drug Use: No  . Sexual Activity: Yes   Other Topics Concern  . Not on file   Social History Narrative  . No narrative on file     Family History  Problem Relation Age of Onset  . CAD Father 34  . AAA (abdominal aortic aneurysm) Father   . Alcohol abuse Father   . Hypertension Father   . Diabetes Father   . Cardiomyopathy Daughter   . Stroke Mother   . Arthritis Mother   . Heart disease Mother   . Hypertension Mother   . Colon cancer Neg Hx   . Prostate cancer Neg Hx        Medication List       This list is accurate as of: 09/01/14 11:59 PM.  Always use your most recent med list.               ACAI PO  Take 50 mg by mouth 4 (four) times daily.     aspirin EC 81 MG tablet  Take 1 tablet (81 mg total) by mouth daily.     atorvastatin 80 MG tablet  Commonly known as:  LIPITOR  Take 1 tablet (80 mg total) by mouth daily at 6 PM.     B-50 PO  Take 1 tablet by mouth daily.     buPROPion 100 MG tablet  Commonly known as:  WELLBUTRIN  Take 1 tablet (100 mg total) by mouth 2 (two) times daily.     cilostazol 50 MG tablet  Commonly known as:  PLETAL  Take 1 tablet by mouth twice daily (resume after completing course of Effient)     CO Q 10 PO  Take 100 mg by mouth daily.     docusate sodium 250 MG capsule  Commonly known as:  COLACE  Take 250 mg by mouth daily.     irbesartan 150 MG tablet  Commonly known as:  AVAPRO  Take 1 tablet (150 mg total) by mouth daily.     metoprolol tartrate 25 MG tablet  Commonly known as:  LOPRESSOR  Take 1 tablet (25 mg total) by mouth 2 (two) times daily.     nitroGLYCERIN 0.4 MG SL tablet  Commonly known as:  NITROSTAT  Place 1 tablet (0.4 mg total) under the tongue every 5 (five) minutes as needed for chest pain.     oseltamivir 75 MG capsule  Commonly known as:  TAMIFLU  Take 1  capsule (75 mg total) by mouth daily.           Objective:   Physical Exam BP 132/78  Pulse 54  Temp(Src) 99 F (37.2 C) (Oral)  Ht  (1.854 m)  Wt 182 lb 6 oz (82.725 kg)  BMI 24.07 kg/m2  SpO2 97% General -- alert, well-developed, NAD.   HEENT-- Not pale. Lungs -- normal respiratory effort, no intercostal retractions, no accessory muscle use, and normal breath sounds.  Heart-- normal rate, regular rhythm, no murmur.   Extremities-- no pretibial edema bilaterally  Right foot and ankle normal Left foot normal, left ankle enlarged  consistent with DJD, range of motion limited  Neurologic--  alert & oriented X3. Speech normal, gait appropriate for age, strength symmetric and appropriate for age.  Psych-- Cognition and judgment appear intact. Cooperative with normal attention span and concentration. No anxious or depressed appearing.        Assessment & Plan:   Today , I spent > 35  min with the patient: >50% of the time counseling regards tobacco, also deciding if appropriate to get the flu shot

## 2014-09-07 ENCOUNTER — Other Ambulatory Visit (HOSPITAL_COMMUNITY): Payer: Self-pay | Admitting: Cardiology

## 2014-09-07 DIAGNOSIS — I739 Peripheral vascular disease, unspecified: Secondary | ICD-10-CM

## 2014-09-11 ENCOUNTER — Other Ambulatory Visit: Payer: Self-pay | Admitting: Cardiovascular Disease

## 2014-09-14 ENCOUNTER — Telehealth: Payer: Self-pay | Admitting: Cardiovascular Disease

## 2014-09-14 ENCOUNTER — Ambulatory Visit (HOSPITAL_COMMUNITY): Payer: 59 | Attending: Internal Medicine | Admitting: Cardiology

## 2014-09-14 DIAGNOSIS — I2510 Atherosclerotic heart disease of native coronary artery without angina pectoris: Secondary | ICD-10-CM | POA: Insufficient documentation

## 2014-09-14 DIAGNOSIS — Z951 Presence of aortocoronary bypass graft: Secondary | ICD-10-CM | POA: Insufficient documentation

## 2014-09-14 DIAGNOSIS — F17200 Nicotine dependence, unspecified, uncomplicated: Secondary | ICD-10-CM | POA: Diagnosis not present

## 2014-09-14 DIAGNOSIS — I10 Essential (primary) hypertension: Secondary | ICD-10-CM | POA: Insufficient documentation

## 2014-09-14 DIAGNOSIS — I739 Peripheral vascular disease, unspecified: Secondary | ICD-10-CM | POA: Insufficient documentation

## 2014-09-14 NOTE — Progress Notes (Signed)
ABI and left lower extremity arterial duplex performed  

## 2014-09-14 NOTE — Telephone Encounter (Signed)
Walk in pt Form " [facility] Statement" Dropped Off No ROI/PMT   Sent to [facility] For Completion   9.24.15/km

## 2014-11-10 ENCOUNTER — Other Ambulatory Visit (HOSPITAL_COMMUNITY): Payer: Self-pay | Admitting: *Deleted

## 2014-11-10 DIAGNOSIS — I739 Peripheral vascular disease, unspecified: Secondary | ICD-10-CM

## 2014-11-17 ENCOUNTER — Other Ambulatory Visit: Payer: Self-pay | Admitting: Cardiovascular Disease

## 2014-11-30 ENCOUNTER — Encounter (HOSPITAL_COMMUNITY): Payer: Self-pay | Admitting: Cardiovascular Disease

## 2014-11-30 ENCOUNTER — Encounter (HOSPITAL_COMMUNITY): Payer: 59

## 2014-12-01 ENCOUNTER — Ambulatory Visit (HOSPITAL_COMMUNITY): Payer: 59 | Attending: Cardiovascular Disease | Admitting: *Deleted

## 2014-12-01 DIAGNOSIS — Z4589 Encounter for adjustment and management of other implanted devices: Secondary | ICD-10-CM | POA: Insufficient documentation

## 2014-12-01 DIAGNOSIS — F17210 Nicotine dependence, cigarettes, uncomplicated: Secondary | ICD-10-CM | POA: Insufficient documentation

## 2014-12-01 DIAGNOSIS — I10 Essential (primary) hypertension: Secondary | ICD-10-CM | POA: Diagnosis not present

## 2014-12-01 DIAGNOSIS — Z95828 Presence of other vascular implants and grafts: Secondary | ICD-10-CM | POA: Diagnosis not present

## 2014-12-01 DIAGNOSIS — Z951 Presence of aortocoronary bypass graft: Secondary | ICD-10-CM | POA: Diagnosis not present

## 2014-12-01 DIAGNOSIS — I2510 Atherosclerotic heart disease of native coronary artery without angina pectoris: Secondary | ICD-10-CM | POA: Diagnosis not present

## 2014-12-01 DIAGNOSIS — I739 Peripheral vascular disease, unspecified: Secondary | ICD-10-CM | POA: Diagnosis not present

## 2014-12-01 NOTE — Progress Notes (Signed)
Lt LEA Duplex + ABI

## 2014-12-05 ENCOUNTER — Ambulatory Visit (INDEPENDENT_AMBULATORY_CARE_PROVIDER_SITE_OTHER): Payer: 59 | Admitting: Cardiovascular Disease

## 2014-12-05 ENCOUNTER — Encounter: Payer: Self-pay | Admitting: Cardiovascular Disease

## 2014-12-05 VITALS — BP 118/72 | HR 60 | Ht 73.0 in | Wt 182.4 lb

## 2014-12-05 DIAGNOSIS — I739 Peripheral vascular disease, unspecified: Secondary | ICD-10-CM

## 2014-12-05 NOTE — Patient Instructions (Signed)
Your physician has requested that you have a lower extremity arterial duplex- in 3 months. This test is an ultrasound of the arteries in the legs. It looks at arterial blood flow in the legs. Allow one hour for Lower Arterial scans. There are no restrictions or special instructions  Your physician recommends that you continue on your current medications as directed. Please refer to the Current Medication list given to you today.  Your physician wants you to follow-up in: 6 months with Dr. Kirke CorinArida. You will receive a reminder letter in the mail two months in advance. If you don't receive a letter, please call our office to schedule the follow-up appointment.

## 2014-12-05 NOTE — Progress Notes (Signed)
Primary cardiologist: [name]  History of Present Illness: This is a very pleasant 63 year old man who is here today for a followup visit regarding peripheral arterial disease. He has known history of tobacco abuse, HTN and CAD . He was [facility] to [facility] 10/29/13 with anterior STEMI and found to have moderately severe left main and LAD stenosis, moderately severe RCA stenosis. He underwent 4V CABG on 11/04/13 per Dr. Dorris FetchHendrickson (LIMA to mid LAD, SVG to OM, SVG to PDA, SVG to Diagonal). LVEF=40-45% prior to bypass.   He was seen in April for bilateral calf claudication worse on the left side.  Noninvasive evaluation showed an ABI of 0.78 on the right and 0.41 on the left with evidence of left distal SFA occlusion and significant disease in the right SFA. I proceeded with lower extremity angiography which showed: 1. No significant aortoiliac disease.  2. Occluded left distal SFA into the proximal popliteal artery with 2 vessel runoff below the knee.  3. Possible distal right SFA stenosis with 1 vessel runoff below the knee.   I performed successful angioplasty and self-expanding stent placement to the left SFA with 2 Supera stents. Recent noninvasive evaluation showed normal ABI bilaterally but moderate instent restenosis in left SFA with peak velocity of 256.  He has been doing very well and denies any claudication.    Primary Care Physician: Dr. Earlene Plateravis ([facility])  Last Lipid Profile:Lipid Panel     Component Value Date/Time   CHOL 99 03/09/2014 1401   TRIG 47.0 03/09/2014 1401   HDL 47.50 03/09/2014 1401   CHOLHDL 2 03/09/2014 1401   VLDL 9.4 03/09/2014 1401   LDLCALC 42 03/09/2014 1401     Past Medical History  Diagnosis Date  . Hypertension   . Tobacco abuse   . Ischemic cardiomyopathy   . CAD (coronary artery disease)   . MI (myocardial infarction)   . PVD (peripheral vascular disease)     thighs  . Allergy     Past Surgical History    Procedure Laterality Date  . Toe surgery    . Appendectomy    . Knee surgery      fractured patella  . Mouth surgery    . Coronary artery bypass graft N/A 11/04/2013    Procedure: CORONARY ARTERY BYPASS GRAFTING (CABG) TIMES FOUR  USING LEFT INTERNAL MAMMARY ARTERY AND RIGHT AND LEFT SAPHENOUS LEG VEIN HARVESTED ENDOSCOPICALLY;  Surgeon: Loreli SlotSteven C Hendrickson, MD;  [facility];  Service: Open Heart Surgery;  Laterality: N/A;  . Left heart cath N/A 10/29/2013    Procedure: LEFT HEART CATH;  Surgeon: Kathleene Hazelhristopher D McAlhany, MD;  [facility];  Service: Cardiovascular;  Laterality: N/A;  . Left heart catheterization with coronary angiogram N/A 10/31/2013    Procedure: LEFT HEART CATHETERIZATION WITH CORONARY ANGIOGRAM;  Surgeon: Kathleene Hazelhristopher D McAlhany, MD;  [facility];  Service: Cardiovascular;  Laterality: N/A;  . Abdominal aortagram N/A 06/07/2014    Procedure: ABDOMINAL Ronny FlurryAORTAGRAM;  Surgeon: Iran OuchMuhammad A Sean Malinowski, MD;  Location: [facility] CATH LAB;  Service: Cardiovascular;  Laterality: N/A;    Current Outpatient Prescriptions  Medication Sig Dispense Refill  . ACAI PO Take 50 mg by mouth 4 (four) times daily.    Marland Kitchen. aspirin EC 81 MG tablet Take 1 tablet (81 mg total) by mouth daily. 90 tablet 3  . atorvastatin (LIPITOR) 80 MG tablet Take 1 tablet by mouth  daily at 6 PM. 90 tablet 0  . buPROPion (  WELLBUTRIN) 100 MG tablet Take 1 tablet (100 mg total) by mouth 2 (two) times daily. 60 tablet 1  . cilostazol (PLETAL) 50 MG tablet Take 1 tablet by mouth twice daily (resume after completing course of Effient) 180 tablet 3  . Coenzyme Q10 (CO Q 10 PO) Take 100 mg by mouth daily.     Marland Kitchen. docusate sodium (COLACE) 250 MG capsule Take 250 mg by mouth daily.    . irbesartan (AVAPRO) 150 MG tablet Take 1 tablet (150 mg total) by mouth daily. 90 tablet 3  . metoprolol tartrate (LOPRESSOR) 25 MG tablet Take 1 tablet by mouth two  times daily 180 tablet 0  . nitroGLYCERIN (NITROSTAT) 0.4 MG SL  tablet Place 1 tablet (0.4 mg total) under the tongue every 5 (five) minutes as needed for chest pain. 25 tablet 6  . oseltamivir (TAMIFLU) 75 MG capsule Take 1 capsule (75 mg total) by mouth daily. 10 capsule 0  . Vitamins-Lipotropics (B-50 PO) Take 1 tablet by mouth daily.      No current facility-administered medications for this visit.    Allergies  Allergen Reactions  . Ace Inhibitors Swelling    Angioedema  . Dairy Aid [Lactase]   . Eggs Or Egg-Derived Products   . Peanut-Containing Drug Products     Does not know which nuts, but states nuts make him vomit    History   Social History  . Marital Status: Married    Spouse Name: [name]    Number of Children: 1  . Years of Education: N/A   Occupational History  . 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 Smoking status: Current Every Day Smoker -- 0.25 packs/day for 30 years    Types: Cigarettes  . Smokeless tobacco: Not on file     Comment: < 1/2 ppd  . Alcohol Use: No  . Drug Use: No  . Sexual Activity: Yes   Other Topics Concern  . Not on file   Social History Narrative    Family History  Problem Relation Age of Onset  . CAD Father [id number]  . AAA (abdominal aortic aneurysm) Father   . Alcohol abuse Father   . Hypertension Father   . Diabetes Father   . Cardiomyopathy Daughter   . Stroke Mother   . Arthritis Mother   . Heart disease Mother   . Hypertension Mother   . Colon cancer Neg Hx   . Prostate cancer Neg Hx     Review of Systems:  As stated in the HPI and otherwise negative.   There were no vitals taken for this visit.  Physical Examination: General: Well developed, well nourished, NAD HEENT: OP clear, mucus membranes moist SKIN: warm, dry. No rashes. Neuro: No focal deficits Musculoskeletal: Muscle strength 5/5 all ext Psychiatric: Mood and affect normal Neck: No JVD, no carotid bruits, no thyromegaly, no lymphadenopathy. Lungs:Clear bilaterally, no  wheezes, rhonci, crackles Cardiovascular: Regular rate and rhythm. No murmurs, gallops or rubs. Abdomen:Soft. Bowel sounds present. Non-tender.  Extremities: No lower extremity edema.  vascular: Femoral pulses are normal bilaterally. Posterior tibial: +2 on the right and faint on the left side. Dorsalis pedis: Absent on both sides.     Assessment and Plan:    1. peripheral arterial disease : Status post recent stent placement to the left SFA. Left calf claudication resolved.  There is borderline restenosis in LSFA. However, he has no claudication and ABI is normal. Recommend LE  arterial duplex in 3 months.   2. CAD: Stable s/p CABG November 2014. . Doing well. Continue ASA, statin, beta blocker.   3. Ischemic Cardiomyopathy: Echo 12/20/13 which was 6 weeks post CABG shows LVEF=35-40%.

## 2015-01-02 ENCOUNTER — Encounter: Payer: Self-pay | Admitting: Internal Medicine

## 2015-01-02 ENCOUNTER — Ambulatory Visit (INDEPENDENT_AMBULATORY_CARE_PROVIDER_SITE_OTHER): Payer: 59 | Admitting: Internal Medicine

## 2015-01-02 VITALS — BP 135/84 | HR 53 | Temp 98.3°F | Ht 73.0 in | Wt 182.0 lb

## 2015-01-02 DIAGNOSIS — Z23 Encounter for immunization: Secondary | ICD-10-CM

## 2015-01-02 DIAGNOSIS — R945 Abnormal results of liver function studies: Secondary | ICD-10-CM

## 2015-01-02 DIAGNOSIS — R7989 Other specified abnormal findings of blood chemistry: Secondary | ICD-10-CM

## 2015-01-02 DIAGNOSIS — R7303 Prediabetes: Secondary | ICD-10-CM

## 2015-01-02 DIAGNOSIS — Z0000 Encounter for general adult medical examination without abnormal findings: Secondary | ICD-10-CM

## 2015-01-02 LAB — BASIC METABOLIC PANEL
BUN: 16 mg/dL (ref 6–23)
CO2: 21 mEq/L (ref 19–32)
Calcium: 9.8 mg/dL (ref 8.4–10.5)
Chloride: 111 mEq/L (ref 96–112)
Creatinine, Ser: 1.1 mg/dL (ref 0.4–1.5)
GFR: 89.22 mL/min (ref >60.00)
Glucose, Bld: 109 mg/dL — ABNORMAL HIGH (ref 70–99)
Potassium: 4.3 mEq/L (ref 3.5–5.1)
Sodium: 140 mEq/L (ref 135–145)

## 2015-01-02 LAB — LIPID PANEL
Cholesterol: 117 mg/dL (ref 0–200)
HDL: 44.7 mg/dL (ref >39.00)
LDL Cholesterol: 64 mg/dL (ref 0–99)
NonHDL: 72.3
Total CHOL/HDL Ratio: 3
Triglycerides: 43 mg/dL (ref 0.0–149.0)
VLDL: 8.6 mg/dL (ref 0.0–40.0)

## 2015-01-02 LAB — HEMOGLOBIN A1C: Hgb A1c MFr Bld: 6.4 % (ref 4.6–6.5)

## 2015-01-02 LAB — PSA: PSA: 1.74 ng/mL (ref 0.10–4.00)

## 2015-01-02 LAB — ALT: ALT: 65 U/L — ABNORMAL HIGH (ref 0–53)

## 2015-01-02 LAB — AST: AST: 52 U/L — ABNORMAL HIGH (ref 0–37)

## 2015-01-02 NOTE — Progress Notes (Signed)
Subjective:    Patient ID: Lonnie [name], male    DOB: 1951-01-26, 64 y.o.   MRN: [id number]  DOS:  01/02/2015 Type of visit - description : cpx Interval history: doing well   ROS    Denies chest pain, difficulty breathing or palpitations No nausea, vomiting, diarrhea or blood in the stools No cough or sputum production. No anxiety depression No dysuria, gross hematuria difficulty urinating  Past Medical History  Diagnosis Date  . Hypertension   . Tobacco abuse   . Ischemic cardiomyopathy   . CAD (coronary artery disease)   . MI (myocardial infarction)   . PVD (peripheral vascular disease)     thighs  . Allergy     Past Surgical History  Procedure Laterality Date  . Toe surgery    . Appendectomy    . Knee surgery      fractured patella  . Mouth surgery    . Coronary artery bypass graft N/A 11/04/2013    Procedure: CORONARY ARTERY BYPASS GRAFTING (CABG) TIMES FOUR  USING LEFT INTERNAL MAMMARY ARTERY AND RIGHT AND LEFT SAPHENOUS LEG VEIN HARVESTED ENDOSCOPICALLY;  Surgeon: Loreli Slot, MD;  Location: [facility] OR;  Service: Open Heart Surgery;  Laterality: N/A;  . Left heart cath N/A 10/29/2013    Procedure: LEFT HEART CATH;  Surgeon: Kathleene Hazel, MD;  Location: [facility] CATH LAB;  Service: Cardiovascular;  Laterality: N/A;  . Left heart catheterization with coronary angiogram N/A 10/31/2013    Procedure: LEFT HEART CATHETERIZATION WITH CORONARY ANGIOGRAM;  Surgeon: Kathleene Hazel, MD;  Location: [facility] CATH LAB;  Service: Cardiovascular;  Laterality: N/A;  . Abdominal aortagram N/A 06/07/2014    Procedure: ABDOMINAL Ronny Flurry;  Surgeon: Iran Ouch, MD;  Location: [facility] CATH LAB;  Service: Cardiovascular;  Laterality: N/A;    History   Social History  . Marital Status: Married    Spouse Name: [name]    Number of Children: 1  . Years of Education: N/A   Occupational History  . long term disability --Photographer   Social History Main  Topics  . Smoking status: Current Every Day Smoker -- 0.25 packs/day for 30 years    Types: Cigarettes  . Smokeless tobacco: Never Used     Comment: < 1/2 ppd  . Alcohol Use: No  . Drug Use: No  . Sexual Activity: Yes   Other Topics Concern  . Not on file   Social History Narrative   Household-- pt and wife   1 daughter      Family History  Problem Relation Age of Onset  . CAD Father 33  . AAA (abdominal aortic aneurysm) Father   . Alcohol abuse Father   . Hypertension Father   . Diabetes Father   . Cardiomyopathy Daughter   . Stroke Mother   . Arthritis Mother   . Heart disease Mother   . Hypertension Mother   . Colon cancer Neg Hx   . Prostate cancer Neg Hx        Medication List       This list is accurate as of: 01/02/15  5:45 PM.  Always use your most recent med list.               ACAI PO  Take 50 mg by mouth 4 (four) times daily.     aspirin EC 81 MG tablet  Take 1 tablet (81 mg total) by mouth daily.     atorvastatin 80 MG tablet  Commonly known as:  LIPITOR  Take 1 tablet by mouth  daily at 6 PM.     B-50 PO  Take 1 tablet by mouth daily.     cilostazol 50 MG tablet  Commonly known as:  PLETAL  Take 1 tablet by mouth twice daily (resume after completing course of Effient)     CO Q 10 PO  Take 100 mg by mouth daily.     docusate sodium 250 MG capsule  Commonly known as:  COLACE  Take 250 mg by mouth daily.     irbesartan 150 MG tablet  Commonly known as:  AVAPRO  Take 1 tablet (150 mg total) by mouth daily.     metoprolol tartrate 25 MG tablet  Commonly known as:  LOPRESSOR  Take 1 tablet by mouth two  times daily     nitroGLYCERIN 0.4 MG SL tablet  Commonly known as:  NITROSTAT  Place 1 tablet (0.4 mg total) under the tongue every 5 (five) minutes as needed for chest pain.           Objective:   Physical Exam BP 135/84 mmHg  Pulse 53  Temp(Src) 98.3 F (36.8 C) (Oral)  Ht 6\' 1"  (1.854 m)  Wt 182 lb (82.555 kg)  BMI  24.02 kg/m2  SpO2 97% General -- alert, well-developed, NAD.  Neck --no thyromegaly , normal carotid pulse  HEENT-- Not pale.  Lungs -- normal respiratory effort, no intercostal retractions, no accessory muscle use, and normal breath sounds.  Heart-- normal rate, regular rhythm, no murmur.  Abdomen-- Not distended, good bowel sounds,soft, non-tender. No bruit. Rectal-- No external abnormalities noted. Normal sphincter tone. No rectal masses or tenderness. Stool brown   Prostate--Prostate gland firm and smooth, no enlargement, nodularity, tenderness, mass, asymmetry or induration. Extremities-- no pretibial edema bilaterally  Neurologic--  alert & oriented X3. Speech normal, gait appropriate for age, strength symmetric and appropriate for age.  Psych-- Cognition and judgment appear intact. Cooperative with normal attention span and concentration. No anxious or depressed appearing.      Assessment & Plan:

## 2015-01-02 NOTE — Progress Notes (Signed)
Pre visit review using our clinic review tool, if applicable. No additional management support is needed unless otherwise documented below in the visit note. 

## 2015-01-02 NOTE — Assessment & Plan Note (Addendum)
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

## 2015-01-02 NOTE — Patient Instructions (Signed)
Get your blood work before you leave      Please come back to the office in 6 months for a routine check up  No fasting

## 2015-01-03 ENCOUNTER — Ambulatory Visit (INDEPENDENT_AMBULATORY_CARE_PROVIDER_SITE_OTHER): Payer: 59 | Admitting: Cardiovascular Disease

## 2015-01-03 ENCOUNTER — Encounter: Payer: Self-pay | Admitting: Cardiovascular Disease

## 2015-01-03 VITALS — BP 120/88 | HR 57 | Ht 73.0 in | Wt 183.4 lb

## 2015-01-03 DIAGNOSIS — I255 Ischemic cardiomyopathy: Secondary | ICD-10-CM

## 2015-01-03 DIAGNOSIS — Z720 Tobacco use: Secondary | ICD-10-CM

## 2015-01-03 DIAGNOSIS — I10 Essential (primary) hypertension: Secondary | ICD-10-CM

## 2015-01-03 DIAGNOSIS — I739 Peripheral vascular disease, unspecified: Secondary | ICD-10-CM

## 2015-01-03 DIAGNOSIS — I2510 Atherosclerotic heart disease of native coronary artery without angina pectoris: Secondary | ICD-10-CM

## 2015-01-03 NOTE — Progress Notes (Signed)
History of Present Illness: 64 yo male with history of tobacco abuse, HTN and CAD who is here today for cardiac follow up. He was admitted to [facility] 10/29/13 with anterior STEMI and found to have moderately severe left main and LAD stenosis, moderately severe RCA stenosis. I did not initially see a culprit vessel. He continued to have angina and repeat cath on 10/31/13 with IVUS of the left main artery showed significant disease. He underwent 4V CABG on 11/04/13 per Dr. Dorris FetchHendrickson (LIMA to mid LAD, SVG to OM, SVG to PDA, SVG to Diagonal). His post-operative course was uneventful. LVEF=40-45% prior to bypass. Left SFA stent per Dr. Kirke CorinArida. Did not tolerate Plavix and now on Effient. Ace-inh induced angioedema on 06/13/14.   He is here today for follow up. No chest pain or SOB. No palpitations. Still smoking 3 cigarettes per day. No leg pain with ambulation.   Primary Care Physician: Dr. Earlene Plateravis ([facility])  Last Lipid Profile:Lipid Panel     Component Value Date/Time   CHOL 117 01/02/2015 0836   TRIG 43.0 01/02/2015 0836   HDL 44.70 01/02/2015 0836   CHOLHDL 3 01/02/2015 0836   VLDL 8.6 01/02/2015 0836   LDLCALC 64 01/02/2015 0836     Past Medical History  Diagnosis Date  . Hypertension   . Tobacco abuse   . Ischemic cardiomyopathy   . CAD (coronary artery disease)   . MI (myocardial infarction)   . PVD (peripheral vascular disease)     thighs  . Allergy     Past Surgical History  Procedure Laterality Date  . Toe surgery    . Appendectomy    . Knee surgery      fractured patella  . Mouth surgery    . Coronary artery bypass graft N/A 11/04/2013    Procedure: CORONARY ARTERY BYPASS GRAFTING (CABG) TIMES FOUR  USING LEFT INTERNAL MAMMARY ARTERY AND RIGHT AND LEFT SAPHENOUS LEG VEIN HARVESTED ENDOSCOPICALLY;  Surgeon: Loreli SlotSteven C Hendrickson, MD;  [facility];  Service: Open Heart Surgery;  Laterality: N/A;  . Left heart cath N/A 10/29/2013    Procedure:  LEFT HEART CATH;  Surgeon: Kathleene Hazelhristopher D McAlhany, MD;  [facility];  Service: Cardiovascular;  Laterality: N/A;  . Left heart catheterization with coronary angiogram N/A 10/31/2013    Procedure: LEFT HEART CATHETERIZATION WITH CORONARY ANGIOGRAM;  Surgeon: Kathleene Hazelhristopher D McAlhany, MD;  [facility];  Service: Cardiovascular;  Laterality: N/A;  . Abdominal aortagram N/A 06/07/2014    Procedure: ABDOMINAL Ronny FlurryAORTAGRAM;  Surgeon: Iran OuchMuhammad A Arida, MD;  Location: [facility] CATH LAB;  Service: Cardiovascular;  Laterality: N/A;    Current Outpatient Prescriptions  Medication Sig Dispense Refill  . ACAI PO Take 50 mg by mouth 4 (four) times daily. By mouth    . aspirin EC 81 MG tablet Take 1 tablet (81 mg total) by mouth daily. 90 tablet 3  . atorvastatin (LIPITOR) 80 MG tablet Take 1 tablet by mouth  daily at 6 PM. 90 tablet 0  . cilostazol (PLETAL) 50 MG tablet Take 1 tablet by mouth twice daily (resume after completing course of Effient) 180 tablet 3  . Coenzyme Q10 (CO Q 10 PO) Take 100 mg by mouth daily.     Marland Kitchen. docusate sodium (COLACE) 250 MG capsule Take 250 mg by mouth daily.    . irbesartan (AVAPRO) 150 MG tablet Take 1 tablet (150 mg total) by mouth daily. 90 tablet 3  . metoprolol tartrate (LOPRESSOR)  25 MG tablet Take 1 tablet by mouth two  times daily 180 tablet 0  . nitroGLYCERIN (NITROSTAT) 0.4 MG SL tablet Place 1 tablet (0.4 mg total) under the tongue every 5 (five) minutes as needed for chest pain. 25 tablet 6  . Vitamins-Lipotropics (B-50 PO) Take 1 tablet by mouth daily.      No current facility-administered medications for this visit.    Allergies  Allergen Reactions  . Ace Inhibitors Swelling    Angioedema  . Dairy Aid [Lactase]   . Eggs Or Egg-Derived Products   . Peanut-Containing Drug Products     Does not know which nuts, but states nuts make him vomit    History   Social History  . Marital Status: Married    Spouse Name: [name]    Number of Children: 1    . Years of Education: N/A   Occupational History  . long term disability --Photographer   Social History Main Topics  . Smoking status: Current Every Day Smoker -- 0.25 packs/day for 30 years    Types: Cigarettes  . Smokeless tobacco: Never Used     Comment: < 1/2 ppd  . Alcohol Use: No  . Drug Use: No  . Sexual Activity: Yes   Other Topics Concern  . Not on file   Social History Narrative   Household-- pt and wife   1 daughter     Family History  Problem Relation Age of Onset  . CAD Father 37  . AAA (abdominal aortic aneurysm) Father   . Alcohol abuse Father   . Hypertension Father   . Diabetes Father   . Cardiomyopathy Daughter   . Stroke Mother   . Arthritis Mother   . Heart disease Mother   . Hypertension Mother   . Colon cancer Neg Hx   . Prostate cancer Neg Hx     Review of Systems:  As stated in the HPI and otherwise negative.   BP 120/88 mmHg  Pulse 57  Ht  (1.854 m)  Wt 183 lb 6.4 oz (83.19 kg)  BMI 24.20 kg/m2  SpO2 99%  Physical Examination: General: Well developed, well nourished, NAD HEENT: OP clear, mucus membranes moist SKIN: warm, dry. No rashes. Neuro: No focal deficits Musculoskeletal: Muscle strength 5/5 all ext Psychiatric: Mood and affect normal Neck: No JVD, no carotid bruits, no thyromegaly, no lymphadenopathy. Lungs:Clear bilaterally, no wheezes, rhonci, crackles Cardiovascular: Regular rate and rhythm. No murmurs, gallops or rubs. Abdomen:Soft. Bowel sounds present. Non-tender.  Extremities: No lower extremity edema. Pulses are 2 + in the right DP/PT and trace left DP/PT.   Echo 12/20/13: Left ventricle: Diffuse hypokinesis with abnormal septal motion EF somewhat hard to judge as patient had frequent bigemminy The cavity size was moderately dilated. Wall thickness was increased in a pattern of mild LVH. Systolic function was moderately reduced. The estimated ejection fraction was in the range of 35% to 40%. -  Left atrium: The atrium was mildly dilated. - Atrial septum: No defect or patent foramen ovale was identified. - Pulmonary arteries: PA peak pressure: 31mm Hg (S).  Assessment and Plan:   1. CAD: Stable s/p CABG November 2014. . Doing well. Continue ASA, statin, beta blocker, ARB.   2. Ischemic Cardiomyopathy: Echo 12/20/13 which was 6 weeks post CABG shows LVEF=35-40%. Will continue current meds. Since he had angioedema presumed to be due to Lisinopril, will continue Avapro 150 mg Qdaily.   3. Tobacco abuse: Complete cessation advised. He  is still smoking several cigarettes per day.   4. HTN: BP well controlled. No changes  5. PAD: Followed in [facility] by Dr. Kirke Corin. Will continue ASA and Pletal.

## 2015-01-03 NOTE — Patient Instructions (Signed)
Your physician wants you to follow-up in:  6 months. You will receive a reminder letter in the mail two months in advance. If you don't receive a letter, please call our office to schedule the follow-up appointment.   

## 2015-01-04 NOTE — Addendum Note (Signed)
Addended by: Dorette GrateFAULKNER, Nona Gracey C on: 01/04/2015 02:52 PM   Modules accepted: Orders

## 2015-01-09 ENCOUNTER — Other Ambulatory Visit: Payer: Self-pay | Admitting: Cardiovascular Disease

## 2015-01-17 ENCOUNTER — Other Ambulatory Visit: Payer: Self-pay | Admitting: Radiology

## 2015-01-17 DIAGNOSIS — I739 Peripheral vascular disease, unspecified: Secondary | ICD-10-CM

## 2015-01-19 ENCOUNTER — Encounter: Payer: Self-pay | Admitting: Physician Assistant

## 2015-01-19 ENCOUNTER — Ambulatory Visit (INDEPENDENT_AMBULATORY_CARE_PROVIDER_SITE_OTHER): Payer: 59 | Admitting: Physician Assistant

## 2015-01-19 VITALS — BP 151/79 | HR 58 | Temp 98.5°F | Resp 16 | Ht 73.0 in | Wt 186.5 lb

## 2015-01-19 DIAGNOSIS — B9689 Other specified bacterial agents as the cause of diseases classified elsewhere: Secondary | ICD-10-CM | POA: Insufficient documentation

## 2015-01-19 DIAGNOSIS — J019 Acute sinusitis, unspecified: Secondary | ICD-10-CM

## 2015-01-19 MED ORDER — AZITHROMYCIN 250 MG PO TABS: ORAL_TABLET | ORAL | Status: DC

## 2015-01-19 NOTE — Assessment & Plan Note (Signed)
Rx Azithromycin.  Increase fluids.  Rest.  Saline nasal spray.  Probiotic.  Mucinex as directed.  Humidifier in bedroom.  Call or return to clinic if symptoms are not improving.  

## 2015-01-19 NOTE — Progress Notes (Signed)
History of Present Illness: Lonnie [name] is a 64 y.o. male who present to the clinic today complaining of sinus pressure, R frontal sinus pain, nasal congestion and PND x 1 week.  Was initially mild but has worsened over the past few days. Patient endorses a dry cough.  Patient denies fever, chills, aches.  History: Past Medical History  Diagnosis Date  . Hypertension   . Tobacco abuse   . Ischemic cardiomyopathy   . CAD (coronary artery disease)   . MI (myocardial infarction)   . PVD (peripheral vascular disease)     thighs  . Allergy     Current outpatient prescriptions:  .  ACAI PO, Take 50 mg by mouth 4 (four) times daily. By mouth, Disp: , Rfl:  .  aspirin EC 81 MG tablet, Take 1 tablet (81 mg total) by mouth daily., Disp: 90 tablet, Rfl: 3 .  atorvastatin (LIPITOR) 80 MG tablet, Take 1 tablet by mouth  daily at 6pm, Disp: 90 tablet, Rfl: 0 .  cilostazol (PLETAL) 50 MG tablet, Take 1 tablet by mouth twice daily (resume after completing course of Effient), Disp: 180 tablet, Rfl: 3 .  Coenzyme Q10 (CO Q 10 PO), Take 100 mg by mouth daily. , Disp: , Rfl:  .  docusate sodium (COLACE) 250 MG capsule, Take 250 mg by mouth daily., Disp: , Rfl:  .  guaiFENesin (MUCINEX) 600 MG 12 hr tablet, Take by mouth 2 (two) times daily., Disp: , Rfl:  .  irbesartan (AVAPRO) 150 MG tablet, Take 1 tablet (150 mg total) by mouth daily., Disp: 90 tablet, Rfl: 3 .  metoprolol tartrate (LOPRESSOR) 25 MG tablet, Take 1 tablet by mouth two  times daily, Disp: 180 tablet, Rfl: 0 .  nitroGLYCERIN (NITROSTAT) 0.4 MG SL tablet, Place 1 tablet (0.4 mg total) under the tongue every 5 (five) minutes as needed for chest pain., Disp: 25 tablet, Rfl: 6 .  Vitamins-Lipotropics (B-50 PO), Take 1 tablet by mouth daily. , Disp: , Rfl:  .  azithromycin (ZITHROMAX) 250 MG tablet, Take 2 tablets on Day 1.  Then take 1 tablet daily., Disp: 6 tablet, Rfl: 0 Allergies  Allergen Reactions  . Ace Inhibitors Swelling   Angioedema  . Dairy Aid [Lactase]   . Eggs Or Egg-Derived Products     Cannot eat Prepared Eggs  . Peanut-Containing Drug Products     Does not know which nuts, but states nuts make him vomit   Family History  Problem Relation Age of Onset  . CAD Father 41  . AAA (abdominal aortic aneurysm) Father   . Alcohol abuse Father   . Hypertension Father   . Diabetes Father   . Cardiomyopathy Daughter   . Stroke Mother   . Arthritis Mother   . Heart disease Mother   . Hypertension Mother   . Colon cancer Neg Hx   . Prostate cancer Neg Hx    History   Social History  . Marital Status: Married    Spouse Name: [name]    Number of Children: 1  . Years of Education: N/A   Occupational History  . long term disability --Photographer   Social History Main Topics  . Smoking status: Current Every Day Smoker -- 0.25 packs/day for 30 years    Types: Cigarettes  . Smokeless tobacco: Never Used     Comment: < 1/2 ppd  . Alcohol Use: No  . Drug Use: No  . Sexual Activity: Yes  Other Topics Concern  . None   Social History Narrative   Household-- pt and wife   1 daughter     Review of Systems: See HPI.  All other ROS are negative.  Physical Examination: BP 151/79 mmHg  Pulse 58  Temp(Src) 98.5 F (36.9 C) (Oral)  Resp 16  Ht 6\' 1"  (1.854 m)  Wt 186 lb 8 oz (84.596 kg)  BMI 24.61 kg/m2  SpO2 100%  General appearance: alert, cooperative and appears stated age Head: Normocephalic, without obvious abnormality, atraumatic, sinuses tender to percussion Eyes: conjunctivae/corneas clear. PERRL, EOM's intact. Fundi benign. Ears: normal TM's and external ear canals both ears Nose: moderate congestion, turbinates swollen, sinus tenderness right Throat: lips, mucosa, and tongue normal; teeth and gums normal Neck: no adenopathy, no carotid bruit, no JVD, supple, symmetrical, trachea midline and thyroid not enlarged, symmetric, no tenderness/mass/nodules Lungs: clear to  auscultation bilaterally Chest wall: no tenderness  Assessment/Plan: Acute bacterial sinusitis Rx Azithromycin.  Increase fluids.  Rest.  Saline nasal spray.  Probiotic.  Mucinex as directed.  Humidifier in bedroom.  Call or return to clinic if symptoms are not improving.

## 2015-01-19 NOTE — Progress Notes (Signed)
Pre visit review using our clinic review tool, if applicable. No additional management support is needed unless otherwise documented below in the visit note/SLS  

## 2015-01-19 NOTE — Patient Instructions (Signed)
Please take antibiotic as directed.  Increase fluid intake.  Use Saline nasal spray.  Take a daily multivitamin. Use Plain Mucinex for congestion. Place a humidifier in the bedroom.  Please call or return clinic if symptoms are not improving.  Sinusitis Sinusitis is redness, soreness, and swelling (inflammation) of the paranasal sinuses. Paranasal sinuses are air pockets within the bones of your face (beneath the eyes, the middle of the forehead, or above the eyes). In healthy paranasal sinuses, mucus is able to drain out, and air is able to circulate through them by way of your nose. However, when your paranasal sinuses are inflamed, mucus and air can become trapped. This can allow bacteria and other germs to grow and cause infection. Sinusitis can develop quickly and last only a short time (acute) or continue over a long period (chronic). Sinusitis that lasts for more than 12 weeks is considered chronic.  CAUSES  Causes of sinusitis include:  Allergies.  Structural abnormalities, such as displacement of the cartilage that separates your nostrils (deviated septum), which can decrease the air flow through your nose and sinuses and affect sinus drainage.  Functional abnormalities, such as when the small hairs (cilia) that line your sinuses and help remove mucus do not work properly or are not present. SYMPTOMS  Symptoms of acute and chronic sinusitis are the same. The primary symptoms are pain and pressure around the affected sinuses. Other symptoms include:  Upper toothache.  Earache.  Headache.  Bad breath.  Decreased sense of smell and taste.  A cough, which worsens when you are lying flat.  Fatigue.  Fever.  Thick drainage from your nose, which often is green and may contain pus (purulent).  Swelling and warmth over the affected sinuses. DIAGNOSIS  Your caregiver will perform a physical exam. During the exam, your caregiver may:  Look in your nose for signs of abnormal  growths in your nostrils (nasal polyps).  Tap over the affected sinus to check for signs of infection.  View the inside of your sinuses (endoscopy) with a special imaging device with a light attached (endoscope), which is inserted into your sinuses. If your caregiver suspects that you have chronic sinusitis, one or more of the following tests may be recommended:  Allergy tests.  Nasal culture A sample of mucus is taken from your nose and sent to a lab and screened for bacteria.  Nasal cytology A sample of mucus is taken from your nose and examined by your caregiver to determine if your sinusitis is related to an allergy. TREATMENT  Most cases of acute sinusitis are related to a viral infection and will resolve on their own within 10 days. Sometimes medicines are prescribed to help relieve symptoms (pain medicine, decongestants, nasal steroid sprays, or saline sprays).  However, for sinusitis related to a bacterial infection, your caregiver will prescribe antibiotic medicines. These are medicines that will help kill the bacteria causing the infection.  Rarely, sinusitis is caused by a fungal infection. In theses cases, your caregiver will prescribe antifungal medicine. For some cases of chronic sinusitis, surgery is needed. Generally, these are cases in which sinusitis recurs more than 3 times per year, despite other treatments. HOME CARE INSTRUCTIONS   Drink plenty of water. Water helps thin the mucus so your sinuses can drain more easily.  Use a humidifier.  Inhale steam 3 to 4 times a day (for example, sit in the bathroom with the shower running).  Apply a warm, moist washcloth to your face 3 to  4 times a day, or as directed by your caregiver.  Use saline nasal sprays to help moisten and clean your sinuses.  Take over-the-counter or prescription medicines for pain, discomfort, or fever only as directed by your caregiver. SEEK IMMEDIATE MEDICAL CARE IF:  You have increasing pain or  severe headaches.  You have nausea, vomiting, or drowsiness.  You have swelling around your face.  You have vision problems.  You have a stiff neck.  You have difficulty breathing. MAKE SURE YOU:   Understand these instructions.  Will watch your condition.  Will get help right away if you are not doing well or get worse. Document Released: 12/08/2005 Document Revised: 03/01/2012 Document Reviewed: 12/23/2011 [facility] Patient Information 2014 [place], [place].

## 2015-01-22 ENCOUNTER — Telehealth: Payer: Self-pay | Admitting: Internal Medicine

## 2015-01-22 NOTE — Telephone Encounter (Signed)
emmi emailed °

## 2015-01-26 ENCOUNTER — Other Ambulatory Visit: Payer: Self-pay | Admitting: Cardiovascular Disease

## 2015-02-12 ENCOUNTER — Encounter: Payer: Self-pay | Admitting: Internal Medicine

## 2015-02-12 ENCOUNTER — Other Ambulatory Visit (INDEPENDENT_AMBULATORY_CARE_PROVIDER_SITE_OTHER): Payer: 59

## 2015-02-12 ENCOUNTER — Telehealth: Payer: Self-pay | Admitting: Internal Medicine

## 2015-02-12 ENCOUNTER — Ambulatory Visit (INDEPENDENT_AMBULATORY_CARE_PROVIDER_SITE_OTHER): Payer: 59 | Admitting: Internal Medicine

## 2015-02-12 VITALS — BP 124/74 | HR 58 | Temp 98.3°F | Ht 73.0 in | Wt 183.1 lb

## 2015-02-12 DIAGNOSIS — R7989 Other specified abnormal findings of blood chemistry: Secondary | ICD-10-CM | POA: Insufficient documentation

## 2015-02-12 DIAGNOSIS — Z0000 Encounter for general adult medical examination without abnormal findings: Secondary | ICD-10-CM

## 2015-02-12 DIAGNOSIS — Z1211 Encounter for screening for malignant neoplasm of colon: Secondary | ICD-10-CM

## 2015-02-12 DIAGNOSIS — R7309 Other abnormal glucose: Secondary | ICD-10-CM

## 2015-02-12 DIAGNOSIS — R195 Other fecal abnormalities: Secondary | ICD-10-CM

## 2015-02-12 DIAGNOSIS — R7303 Prediabetes: Secondary | ICD-10-CM

## 2015-02-12 DIAGNOSIS — R945 Abnormal results of liver function studies: Principal | ICD-10-CM

## 2015-02-12 LAB — FECAL OCCULT BLOOD, IMMUNOCHEMICAL: Fecal Occult Bld: POSITIVE — AB

## 2015-02-12 MED ORDER — NYSTATIN-TRIAMCINOLONE 100000-0.1 UNIT/GM-% EX OINT: 1.0000 "application " | TOPICAL_OINTMENT | Freq: Two times a day (BID) | CUTANEOUS | Status: DC

## 2015-02-12 NOTE — Assessment & Plan Note (Signed)
Increased LFTs Does not drink alcohol, no recent Tylenol, has been taking Lipitor for more than a year. he takes few OTCs like CoQ10, vitamins and ACAI. Plan: Recheck LFTs, will get a Rainbow in case we need for labs

## 2015-02-12 NOTE — Telephone Encounter (Signed)
Caller name: Hadley Penurrentine, Makyle F Relation to pt: self  Call back number:[phone] Pharmacy:  Reason for call:  Pt states nystatin-triamcinolone ointment is to expensive. In need of an alternate, pt stated he will contact pharmacy and call back with an alternate

## 2015-02-12 NOTE — Assessment & Plan Note (Signed)
History of prediabetes and peripheral vascular disease, Medicare discussed at length: Recommend to use a shoe insert with cushion as he has very little subcutaneous fat. Likely has  fungal dermatitis in a moccasin distribution. Will start with Mycolog twice a day. Dorsum rash, that seems to be an acute issue, Mycolog for one week.

## 2015-02-12 NOTE — Progress Notes (Signed)
Pre visit review using our clinic review tool, if applicable. No additional management support is needed unless otherwise documented below in the visit note. 

## 2015-02-12 NOTE — Telephone Encounter (Signed)
FYI. Please advise.

## 2015-02-12 NOTE — Patient Instructions (Signed)
Get your blood work before you leave     Come back in 3-4 months  for a  office visit   Use the ointment  twice a day at the plantar area (both sides) Use the ointment at the left foot (dorsum) x 1 week  Call any time if you have a non-healing wound, ulcer, redness, swelling, infection

## 2015-02-12 NOTE — Progress Notes (Signed)
Subjective:    Patient ID: Lonnie [name], male    DOB: 11-Jun-1951, 64 y.o.   MRN: [id number]  DOS:  02/12/2015 Type of visit - description : Check up, here with his wife, has several concerns Interval history: Recently LFTs were elevated, he does not take Tylenol or drink alcohol. No previous history of increased LFTs.  History of a callus at the ball of  left foot, this is going on for years, no redness, discharge or pain. Had a rash at the dorsum of the left foot last week, very itchy, rash is better. Recently seen with sinusitis, feeling better   Review of Systems Denies fever or chills   Past Medical History  Diagnosis Date  . Hypertension   . Tobacco abuse   . Ischemic cardiomyopathy   . CAD (coronary artery disease)   . MI (myocardial infarction)   . PVD (peripheral vascular disease)     thighs  . Allergy     Past Surgical History  Procedure Laterality Date  . Toe surgery    . Appendectomy    . Knee surgery      fractured patella  . Mouth surgery    . Coronary artery bypass graft N/A 11/04/2013    Procedure: CORONARY ARTERY BYPASS GRAFTING (CABG) TIMES FOUR  USING LEFT INTERNAL MAMMARY ARTERY AND RIGHT AND LEFT SAPHENOUS LEG VEIN HARVESTED ENDOSCOPICALLY;  Surgeon: Loreli SlotSteven C Hendrickson, MD;  [facility];  Service: Open Heart Surgery;  Laterality: N/A;  . Left heart cath N/A 10/29/2013    Procedure: LEFT HEART CATH;  Surgeon: Kathleene Hazelhristopher D McAlhany, MD;  [facility];  Service: Cardiovascular;  Laterality: N/A;  . Left heart catheterization with coronary angiogram N/A 10/31/2013    Procedure: LEFT HEART CATHETERIZATION WITH CORONARY ANGIOGRAM;  Surgeon: Kathleene Hazelhristopher D McAlhany, MD;  [facility];  Service: Cardiovascular;  Laterality: N/A;  . Abdominal aortagram N/A 06/07/2014    Procedure: ABDOMINAL Ronny FlurryAORTAGRAM;  Surgeon: Iran OuchMuhammad A Arida, MD;  Location: [facility] CATH LAB;  Service: Cardiovascular;  Laterality: N/A;    History   Social History    . Marital Status: Married    Spouse Name: Acal[place] RidgeVen[place]  . Number of Children: 1  . Years of Education: N/A   Occupational History  . 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 Smoking status: Current Every Day Smoker -- 0.25 packs/day for 30 years    Types: Cigarettes  . Smokeless tobacco: Never Used     Comment: < 1/2 ppd  . Alcohol Use: No  . Drug Use: No  . Sexual Activity: Yes   Other Topics Concern  . Not on file   Social History Narrative   Household-- pt and wife   1 daughter         Medication List       This list is accurate as of: 02/12/15  9:14 PM.  Always use your most recent med list.               ACAI PO  Take 50 mg by mouth 4 (four) times daily. By mouth     aspirin EC 81 MG tablet  Take 1 tablet (81 mg total) by mouth daily.     atorvastatin 80 MG tablet  Commonly known as:  LIPITOR  Take 1 tablet by mouth  daily at 6pm     azithromycin 250 MG tablet  Commonly known as:  ZITHROMAX  Take 2 tablets  on Day 1.  Then take 1 tablet daily.     B-50 PO  Take 1 tablet by mouth daily.     cilostazol 50 MG tablet  Commonly known as:  PLETAL  Take 1 tablet by mouth twice daily (resume after completing course of Effient)     CO Q 10 PO  Take 100 mg by mouth daily.     docusate sodium 250 MG capsule  Commonly known as:  COLACE  Take 250 mg by mouth daily.     guaiFENesin 600 MG 12 hr tablet  Commonly known as:  MUCINEX  Take by mouth 2 (two) times daily.     irbesartan 150 MG tablet  Commonly known as:  AVAPRO  Take 1 tablet (150 mg total) by mouth daily.     metoprolol tartrate 25 MG tablet  Commonly known as:  LOPRESSOR  Take 1 tablet by mouth two  times daily     nitroGLYCERIN 0.4 MG SL tablet  Commonly known as:  NITROSTAT  Place 1 tablet (0.4 mg total) under the tongue every 5 (five) minutes as needed for chest pain.     nystatin-triamcinolone ointment  Commonly known as:  MYCOLOG  Apply 1  application topically 2 (two) times daily.           Objective:   Physical Exam BP 124/74 mmHg  Pulse 58  Temp(Src) 98.3 F (36.8 C) (Oral)  Ht  (1.854 m)  Wt 183 lb 2 oz (83.065 kg)  BMI 24.17 kg/m2  SpO2 97% General:   Well developed, well nourished . NAD.  HEENT:  Normocephalic . Face symmetric, atraumatic Muscle skeletal: no pretibial edema bilaterally  Right foot normal to inspection, weak pedal pulses. Left foot: Weak pedal pulse. The dorsum has a rash, several areas with mild erythema and evidence of scratching. Has a very superficial 2 mm opening. No bleeding, no discharge.  The skin of the plantar area bilaterally is quite dry and the nails dystrophic. He does have a callus at the base of the left great toe, is very thin, no evidence of infection. In general the plantar areas have very little subcutaneous tissue. Skin: Not pale. Not jaundice Neurologic:  alert & oriented X3.  Speech normal, gait appropriate for age and unassisted Psych--  Cognition and judgment appear intact.  Cooperative with normal attention span and concentration.  Behavior appropriate. No anxious or depressed appearing.        Assessment & Plan:   Today , I spent more than  15  min with the patient: >50% of the time counseling regards feet care , multiple questions and concerns in this regards discussed with the patient and wife

## 2015-02-13 LAB — HEPATIC FUNCTION PANEL
ALT: 40 U/L (ref 0–53)
AST: 34 U/L (ref 0–37)
Albumin: 4.1 g/dL (ref 3.5–5.2)
Alkaline Phosphatase: 74 U/L (ref 39–117)
Bilirubin, Direct: 0.1 mg/dL (ref 0.0–0.3)
Total Bilirubin: 0.5 mg/dL (ref 0.2–1.2)
Total Protein: 7 g/dL (ref 6.0–8.3)

## 2015-02-13 MED ORDER — KETOCONAZOLE 2 % EX CREA: 1.0000 "application " | TOPICAL_CREAM | Freq: Two times a day (BID) | CUTANEOUS | Status: DC

## 2015-02-13 MED ORDER — HYDROCORTISONE 2.5 % EX CREA: TOPICAL_CREAM | Freq: Two times a day (BID) | CUTANEOUS | Status: DC

## 2015-02-13 NOTE — Telephone Encounter (Signed)
Ok , i sent two prescriptions to [facility], let pt know need to mix them 50/50 and use them as previously recomended

## 2015-02-13 NOTE — Telephone Encounter (Signed)
LMOM informing Pt that Dr. Drue NovelPaz has sent Hydrocortisone cream and Nizoral cream to [facility] for him. He is to use it topically twice daily 50/50. Informed him if he had any other questions regarding medication to please call.

## 2015-03-01 ENCOUNTER — Encounter: Payer: Self-pay | Admitting: *Deleted

## 2015-03-01 ENCOUNTER — Encounter: Payer: Self-pay | Admitting: Cardiovascular Disease

## 2015-03-01 ENCOUNTER — Ambulatory Visit (INDEPENDENT_AMBULATORY_CARE_PROVIDER_SITE_OTHER): Payer: 59 | Admitting: Cardiovascular Disease

## 2015-03-01 VITALS — BP 138/88 | HR 64 | Ht 73.0 in | Wt 182.0 lb

## 2015-03-01 DIAGNOSIS — I739 Peripheral vascular disease, unspecified: Secondary | ICD-10-CM

## 2015-03-01 DIAGNOSIS — R0602 Shortness of breath: Secondary | ICD-10-CM

## 2015-03-01 DIAGNOSIS — R42 Dizziness and giddiness: Secondary | ICD-10-CM

## 2015-03-01 NOTE — Progress Notes (Signed)
   Primary cardiologist: Dr. McAlhany  History of Present Illness: This is a very pleasant 63-year-old man who is here today for a followup visit regarding peripheral arterial disease. He has known history of tobacco abuse, HTN and CAD . He was admitted to  10/29/13 with anterior STEMI and found to have moderately severe left main and LAD stenosis, moderately severe RCA stenosis. He underwent 4V CABG on 11/04/13 per Dr. Hendrickson (LIMA to mid LAD, SVG to OM, SVG to PDA, SVG to Diagonal). LVEF=40-45% prior to bypass.   He was seen in April for bilateral calf claudication worse on the left side.  Noninvasive evaluation showed an ABI of 0.78 on the right and 0.41 on the left with evidence of left distal SFA occlusion and significant disease in the right SFA. I proceeded with lower extremity angiography in 05/2014 which showed: 1. No significant aortoiliac disease.  2. Occluded left distal SFA into the proximal popliteal artery with 2 vessel runoff below the knee.  3. Possible distal right SFA stenosis with 1 vessel runoff below the knee.   I performed successful angioplasty and self-expanding stent placement to the left SFA with 2 Supera stents. Noninvasive evaluation in 11/2014 showed normal ABI bilaterally but moderate instent restenosis in left SFA with peak velocity of 256.  Initially, he did not have any left lower extremity discomfort. However, over the last few weeks, he has experienced significant left foot and calf pain and has stopped doing much physical activities because of that. He is also having numbness and dark discoloration.   Primary Care Physician: Dr. Davis ([facility])  Last Lipid Profile:Lipid Panel     Component Value Date/Time   CHOL 117 01/02/2015 0836   TRIG 43.0 01/02/2015 0836   HDL 44.70 01/02/2015 0836   CHOLHDL 3 01/02/2015 0836   VLDL 8.6 01/02/2015 0836   LDLCALC 64 01/02/2015 0836     Past Medical History  Diagnosis Date    . Hypertension   . Tobacco abuse   . Ischemic cardiomyopathy   . CAD (coronary artery disease)   . MI (myocardial infarction)   . PVD (peripheral vascular disease)     thighs  . Allergy     Past Surgical History  Procedure Laterality Date  . Toe surgery    . Appendectomy    . Knee surgery      fractured patella  . Mouth surgery    . Coronary artery bypass graft N/A 11/04/2013    Procedure: CORONARY ARTERY BYPASS GRAFTING (CABG) TIMES FOUR  USING LEFT INTERNAL MAMMARY ARTERY AND RIGHT AND LEFT SAPHENOUS LEG VEIN HARVESTED ENDOSCOPICALLY;  Surgeon: Steven C Hendrickson, MD;  Location: [facility] OR;  Service: Open Heart Surgery;  Laterality: N/A;  . Left heart cath N/A 10/29/2013    Procedure: LEFT HEART CATH;  Surgeon: Christopher D McAlhany, MD;  Location: [facility] CATH LAB;  Service: Cardiovascular;  Laterality: N/A;  . Left heart catheterization with coronary angiogram N/A 10/31/2013    Procedure: LEFT HEART CATHETERIZATION WITH CORONARY ANGIOGRAM;  Surgeon: Christopher D McAlhany, MD;  Location: [facility] CATH LAB;  Service: Cardiovascular;  Laterality: N/A;  . Abdominal aortagram N/A 06/07/2014    Procedure: ABDOMINAL AORTAGRAM;  Surgeon: Leighton Luster A Kensley Valladares, MD;  Location: [facility] CATH LAB;  Service: Cardiovascular;  Laterality: N/A;    Current Outpatient Prescriptions  Medication Sig Dispense Refill  . ACAI PO Take 50 mg by mouth 4 (four) times daily. By mouth    . aspirin EC 81   MG tablet Take 1 tablet (81 mg total) by mouth daily. 90 tablet 3  . atorvastatin (LIPITOR) 80 MG tablet Take 1 tablet by mouth  daily at 6pm 90 tablet 0  . cilostazol (PLETAL) 50 MG tablet Take 1 tablet by mouth twice daily (resume after completing course of Effient) 180 tablet 3  . Coenzyme Q10 (CO Q 10 PO) Take 100 mg by mouth daily.     . docusate sodium (COLACE) 250 MG capsule Take 250 mg by mouth daily.    . guaiFENesin (MUCINEX) 600 MG 12 hr tablet Take by mouth 2 (two) times daily.    . hydrocortisone 2.5 % cream Apply  topically 2 (two) times daily. 60 g 1  . irbesartan (AVAPRO) 150 MG tablet Take 1 tablet (150 mg total) by mouth daily. 90 tablet 3  . ketoconazole (NIZORAL) 2 % cream Apply 1 application topically 2 (two) times daily. 60 g 1  . metoprolol tartrate (LOPRESSOR) 25 MG tablet Take 1 tablet by mouth two  times daily 180 tablet 0  . nitroGLYCERIN (NITROSTAT) 0.4 MG SL tablet Place 1 tablet (0.4 mg total) under the tongue every 5 (five) minutes as needed for chest pain. 25 tablet 6  . Vitamins-Lipotropics (B-50 PO) Take 1 tablet by mouth daily.      No current facility-administered medications for this visit.    Allergies  Allergen Reactions  . Ace Inhibitors Swelling    Angioedema  . Dairy Aid [Lactase]   . Eggs Or Egg-Derived Products     Cannot eat Prepared Eggs  . Peanut-Containing Drug Products     Does not know which nuts, but states nuts make him vomit    History   Social History  . Marital Status: Married    Spouse Name: [name]  . Number of Children: 1  . Years of Education: N/A   Occupational History  . long term disability --pilot [employer]   Social History Main Topics  . Smoking status: Current Every Day Smoker -- 0.25 packs/day for 30 years    Types: Cigarettes  . Smokeless tobacco: Never Used     Comment: < 1/2 ppd  . Alcohol Use: No  . Drug Use: No  . Sexual Activity: Yes   Other Topics Concern  . Not on file   Social History Narrative   Household-- pt and wife   1 daughter     Family History  Problem Relation Age of Onset  . CAD Father 69  . AAA (abdominal aortic aneurysm) Father   . Alcohol abuse Father   . Hypertension Father   . Diabetes Father   . Cardiomyopathy Daughter   . Stroke Mother   . Arthritis Mother   . Heart disease Mother   . Hypertension Mother   . Colon cancer Neg Hx   . Prostate cancer Neg Hx     Review of Systems:  As stated in the HPI and otherwise negative.   BP 138/88 mmHg  Pulse 64  Ht 6' 1" (1.854 m)  Wt 182  lb (82.555 kg)  BMI 24.02 kg/m2  Physical Examination: General: Well developed, well nourished, NAD HEENT: OP clear, mucus membranes moist SKIN: warm, dry. No rashes. Neuro: No focal deficits Musculoskeletal: Muscle strength 5/5 all ext Psychiatric: Mood and affect normal Neck: No JVD, no carotid bruits, no thyromegaly, no lymphadenopathy. Lungs:Clear bilaterally, no wheezes, rhonci, crackles Cardiovascular: Regular rate and rhythm. No murmurs, gallops or rubs. Abdomen:Soft. Bowel sounds present. Non-tender.  Extremities: No   lower extremity edema.  vascular: Femoral pulses are normal bilaterally. Posterior tibial: +2 on the right and absent on the left side. Dorsalis pedis: Normal on the right side and absent on the left side     Assessment and Plan:    1. peripheral arterial disease : The patient is having severe recurrent claudication in the left foot and calf likely due to progression of in-stent restenosis. He has not been able to do much activities because of this and he has noted dark discoloration in the foot as well. Distal pulses are not palpable. Due to all of that, I recommend proceeding with abdominal aortogram with left lower extremity arterial angiography and possible endovascular intervention. Risks, benefits and alternatives were discussed with the patient.  2. CAD: Stable s/p CABG November 2014. . Doing well. Continue ASA, statin, beta blocker.   3. Ischemic Cardiomyopathy: Echo 12/20/13 which was 6 weeks post CABG shows LVEF=35-40%.   

## 2015-03-01 NOTE — Patient Instructions (Signed)
See attached instruction letter regarding your procedure    Your physician recommends that you have labs today:  BMP  INR  CBC

## 2015-03-02 LAB — BASIC METABOLIC PANEL
BUN/Creatinine Ratio: 14 (ref 10–22)
BUN: 14 mg/dL (ref 8–27)
CO2: 20 mmol/L (ref 18–29)
Calcium: 9.5 mg/dL (ref 8.6–10.2)
Chloride: 104 mmol/L (ref 97–108)
Creatinine, Ser: 0.98 mg/dL (ref 0.76–1.27)
GFR calc Af Amer: 96 mL/min/{1.73_m2} (ref >59)
GFR calc non Af Amer: 83 mL/min/{1.73_m2} (ref >59)
Glucose: 115 mg/dL — ABNORMAL HIGH (ref 65–99)
Potassium: 4.3 mmol/L (ref 3.5–5.2)
Sodium: 143 mmol/L (ref 134–144)

## 2015-03-02 LAB — CBC WITH DIFFERENTIAL/PLATELET
Basophils Absolute: 0.1 10*3/uL (ref 0.0–0.2)
Basos: 1 %
Eos: 5 %
Eosinophils Absolute: 0.4 10*3/uL (ref 0.0–0.4)
HCT: 43 % (ref 37.5–51.0)
Hemoglobin: 14.4 g/dL (ref 12.6–17.7)
Immature Grans (Abs): 0 10*3/uL (ref 0.0–0.1)
Immature Granulocytes: 0 %
Lymphocytes Absolute: 2.5 10*3/uL (ref 0.7–3.1)
Lymphs: 31 %
MCH: 27.5 pg (ref 26.6–33.0)
MCHC: 33.5 g/dL (ref 31.5–35.7)
MCV: 82 fL (ref 79–97)
Monocytes Absolute: 0.6 10*3/uL (ref 0.1–0.9)
Monocytes: 8 %
Neutrophils Absolute: 4.4 10*3/uL (ref 1.4–7.0)
Neutrophils Relative %: 55 %
Platelets: 211 10*3/uL (ref 150–379)
RBC: 5.23 x10E6/uL (ref 4.14–5.80)
RDW: 13.6 % (ref 12.3–15.4)
WBC: 8 10*3/uL (ref 3.4–10.8)

## 2015-03-02 LAB — PROTIME-INR
INR: 1 (ref 0.8–1.2)
Prothrombin Time: 10.5 s (ref 9.1–12.0)

## 2015-03-06 ENCOUNTER — Telehealth: Payer: Self-pay | Admitting: Cardiovascular Disease

## 2015-03-06 NOTE — Telephone Encounter (Signed)
Physicians Statement signed By [name] left pt VM ready For Pick Up.

## 2015-03-06 NOTE — Telephone Encounter (Signed)
New Msg  ° ° ° ° ° ° ° °Pt is returning call from today.  ° ° ° °Please return call.  °

## 2015-03-06 NOTE — Telephone Encounter (Signed)
Spoke with pt he's aware Physicians Statement at front Desk for pick up.

## 2015-03-07 ENCOUNTER — Ambulatory Visit (HOSPITAL_COMMUNITY)
Admission: RE | Admit: 2015-03-07 | Discharge: 2015-03-07 | Disposition: A | Discharge status: Home / Self Care | Payer: 59 | Source: Ambulatory Visit | Attending: Cardiovascular Disease | Admitting: Cardiovascular Disease

## 2015-03-07 ENCOUNTER — Other Ambulatory Visit: Payer: Self-pay | Admitting: Cardiovascular Disease

## 2015-03-07 ENCOUNTER — Encounter (HOSPITAL_COMMUNITY): Payer: Self-pay | Admitting: Cardiovascular Disease

## 2015-03-07 ENCOUNTER — Encounter (HOSPITAL_COMMUNITY): Payer: 59

## 2015-03-07 ENCOUNTER — Encounter (HOSPITAL_COMMUNITY)
Admission: RE | Disposition: A | Discharge status: Home / Self Care | Payer: Self-pay | Source: Ambulatory Visit | Attending: Cardiovascular Disease

## 2015-03-07 DIAGNOSIS — I252 Old myocardial infarction: Secondary | ICD-10-CM | POA: Diagnosis not present

## 2015-03-07 DIAGNOSIS — I70212 Atherosclerosis of native arteries of extremities with intermittent claudication, left leg: Secondary | ICD-10-CM | POA: Diagnosis not present

## 2015-03-07 DIAGNOSIS — I255 Ischemic cardiomyopathy: Secondary | ICD-10-CM | POA: Diagnosis not present

## 2015-03-07 DIAGNOSIS — I2510 Atherosclerotic heart disease of native coronary artery without angina pectoris: Secondary | ICD-10-CM | POA: Diagnosis not present

## 2015-03-07 DIAGNOSIS — Z9889 Other specified postprocedural states: Secondary | ICD-10-CM | POA: Insufficient documentation

## 2015-03-07 DIAGNOSIS — Z951 Presence of aortocoronary bypass graft: Secondary | ICD-10-CM | POA: Insufficient documentation

## 2015-03-07 DIAGNOSIS — I739 Peripheral vascular disease, unspecified: Secondary | ICD-10-CM | POA: Diagnosis present

## 2015-03-07 DIAGNOSIS — Z79899 Other long term (current) drug therapy: Secondary | ICD-10-CM | POA: Diagnosis not present

## 2015-03-07 DIAGNOSIS — Z7952 Long term (current) use of systemic steroids: Secondary | ICD-10-CM | POA: Diagnosis not present

## 2015-03-07 DIAGNOSIS — Z7982 Long term (current) use of aspirin: Secondary | ICD-10-CM | POA: Diagnosis not present

## 2015-03-07 DIAGNOSIS — F17210 Nicotine dependence, cigarettes, uncomplicated: Secondary | ICD-10-CM | POA: Insufficient documentation

## 2015-03-07 DIAGNOSIS — I10 Essential (primary) hypertension: Secondary | ICD-10-CM | POA: Insufficient documentation

## 2015-03-07 HISTORY — PX: ABDOMINAL AORTAGRAM: SHX5454

## 2015-03-07 SURGERY — ABDOMINAL AORTAGRAM
Anesthesia: LOCAL

## 2015-03-07 MED ORDER — SODIUM CHLORIDE 0.9 % IV SOLN
INTRAVENOUS | Status: DC
  Administered 2015-03-07: 09:00:00 via INTRAVENOUS

## 2015-03-07 MED ORDER — SODIUM CHLORIDE 0.9 % IV SOLN: 250.0000 mL | INTRAVENOUS | Status: DC | PRN

## 2015-03-07 MED ORDER — CILOSTAZOL 100 MG PO TABS: 100.0000 mg | ORAL_TABLET | Freq: Two times a day (BID) | ORAL | Status: DC

## 2015-03-07 MED ORDER — SODIUM CHLORIDE 0.9 % IV SOLN
INTRAVENOUS | Status: AC
Start: 2015-03-07 — End: 2015-03-07

## 2015-03-07 MED ORDER — HEPARIN (PORCINE) IN NACL 2-0.9 UNIT/ML-% IJ SOLN
INTRAMUSCULAR | Status: AC
  Filled 2015-03-07: qty 1000

## 2015-03-07 MED ORDER — SODIUM CHLORIDE 0.9 % IJ SOLN: 3.0000 mL | Freq: Two times a day (BID) | INTRAMUSCULAR | Status: DC

## 2015-03-07 MED ORDER — ASPIRIN 81 MG PO CHEW
81.0000 mg | CHEWABLE_TABLET | ORAL | Status: AC
  Administered 2015-03-07: 81 mg via ORAL

## 2015-03-07 MED ORDER — MIDAZOLAM HCL 2 MG/2ML IJ SOLN
INTRAMUSCULAR | Status: AC
Start: 2015-03-07 — End: 2015-03-07
  Filled 2015-03-07: qty 2

## 2015-03-07 MED ORDER — SODIUM CHLORIDE 0.9 % IJ SOLN: 3.0000 mL | INTRAMUSCULAR | Status: DC | PRN

## 2015-03-07 MED ORDER — FENTANYL CITRATE 0.05 MG/ML IJ SOLN
INTRAMUSCULAR | Status: AC
  Filled 2015-03-07: qty 2

## 2015-03-07 MED ORDER — ASPIRIN 81 MG PO CHEW
CHEWABLE_TABLET | ORAL | Status: AC
  Administered 2015-03-07: 81 mg via ORAL
  Filled 2015-03-07: qty 1

## 2015-03-07 MED ORDER — LIDOCAINE HCL (PF) 1 % IJ SOLN
INTRAMUSCULAR | Status: AC
Start: 2015-03-07 — End: 2015-03-07
  Filled 2015-03-07: qty 30

## 2015-03-07 NOTE — CV Procedure (Signed)
    PERIPHERAL VASCULAR PROCEDURE  NAME:  Lonnie PenLarry [name]   MRN: [id number] DOB:  07/01/1951   ADMIT DATE: 03/07/2015  Performing Cardiologist: Lorine BearsMuhammad Alexias Margerum Primary Physician: Willow OraJose Paz, MD Primary Cardiologist:  CM  Procedures Performed:  Abdominal Aortic Angiogram with Bi-Iliofemoral Runoff  Second Order left Lower Extremity Angiography with Runoff   Indication(s):   Claudication    Consent: The procedure with Risks/Benefits/Alternatives and Indications was reviewed with the patient .  All questions were answered.  Medications:  Sedation:  2 mg IV Versed, 50 mcg IV Fentanyl  Contrast:  74  Visipaque   Procedural details: The right groin was prepped, draped, and anesthetized with 1% lidocaine. Using modified Seldinger technique, a 5 French sheath was introduced into the right common femoral artery. A 5 Fr Short Pigtail Catheter was advanced of over a  Versicore wire into the descending Aorta to a level just above the renal arteries. A power injection of 8620ml/sec contrast over 1 sec was performed for Abdominal Aortic Angiography.  The catheter was then pulled back to a level just above the Aortic bifurcation, and a second power injection was performed to evaluate the iliac arteries.   The pigtail catheter was changed over the Versicore wire for A crossover catheter which was then pulled back the aortic bifurcation and the wire was advanced down the contralateral common iliac artery.  The wire was then advanced to the contralateral common femoral artery, the catheter was exchanged into an end hole straight tip catheter which was advanced over the wire to the common femoral artery. Contralateral second-order lower extremity angiography was performed via power injection of 5 ml / sec contrast for a total of 35 ml.    Hemodynamics:  Central Aortic Pressure / Mean Aortic Pressure: 130/85  Findings:  Abdominal aorta:   Normal in size with no evidence of aneurysm or obstructive  disease.  Left renal artery: Normal  Right renal artery: Normal  Celiac artery: Not well visualized  Superior mesenteric artery: Patent  Right common iliac artery: Minor irregularities.  Right internal iliac artery: 20% proximal disease.  Right external iliac artery: Minor irregularities.  Left common iliac artery:  Normal  Left internal iliac artery: Minor irregularities.  Left external iliac artery: 20% proximal stenosis.  Left common femoral artery: Minor irregularities.  Left profunda femoral artery: Normal  Left superficial femoral artery:  Diffuse 20% disease proximally. Overlapped stents are noted in the mid and distal segment all the way to the proximal popliteal artery. There is 80% in-stent restenosis proximally. The stents are occluded in the midsegment with reconstitution distally in the mid popliteal artery via collaterals.  Left popliteal artery: Mild diffuse disease in the mid and distal segment.  Left tibial peroneal trunk: 60% proximal stenosis.  Left anterior tibial artery: Patent but diffusely diseased and relatively small in size.  Left peroneal artery: Large in size with minor irregularities. This gives collaterals distally to the occluded posterior tibial artery.  Left posterior tibial artery: Occluded proximally with reconstitution distally via that her annual artery  Conclusions: 1. No significant aortoiliac disease. 2. Occluded mid to distal left SFA within the stent with reconstitution via collaterals in the mid popliteal artery. Two-vessel runoff below the knee.  Recommendations:  Attempt medical therapy with a walking program. There are good collaterals to the occluded SFA. Revascularization can be considered if no improvement.   Lorine BearsMuhammad Bawi Lakins, MD, [facility] 03/07/2015 11:05 AM

## 2015-03-07 NOTE — Discharge Instructions (Signed)
Increase Cilostazol (Pletal) to 100 mg twice daily . A prescription was sent to [facility].  Increase exercise, especially walking.  Angiogram, Care After Refer to this sheet in the next few weeks. These instructions provide you with information on caring for yourself after your procedure. Your health care provider may also give you more specific instructions. Your treatment has been planned according to current medical practices, but problems sometimes occur. Call your health care provider if you have any problems or questions after your procedure.  WHAT TO EXPECT AFTER THE PROCEDURE After your procedure, it is typical to have the following sensations:  Minor discomfort or tenderness and a small bump at the catheter insertion site. The bump should usually decrease in size and tenderness within 1 to 2 weeks.  Any bruising will usually fade within 2 to 4 weeks. HOME CARE INSTRUCTIONS   You may need to keep taking blood thinners if they were prescribed for you. Take medicines only as directed by your health care provider.  Do not apply powder or lotion to the site.  Do not take baths, swim, or use a hot tub until your health care provider approves.  You may shower 24 hours after the procedure. Remove the bandage (dressing) and gently wash the site with plain soap and water. Gently pat the site dry.  Inspect the site at least twice daily.  Limit your activity for the first 48 hours. Do not bend, squat, or lift anything over 20 lb (9 kg) or as directed by your health care provider.  Plan to have someone take you home after the procedure. Follow instructions about when you can drive or return to work. SEEK MEDICAL CARE IF:  You get light-headed when standing up.  You have drainage (other than a small amount of blood on the dressing).  You have chills.  You have a fever.  You have redness, warmth, swelling, or pain at the insertion site. SEEK IMMEDIATE MEDICAL CARE IF:   You develop  chest pain or shortness of breath, feel faint, or pass out.  You have bleeding, swelling larger than a walnut, or drainage from the catheter insertion site.  You develop pain, discoloration, coldness, or severe bruising in the leg or arm that held the catheter.  You have heavy bleeding from the site. If this happens, hold pressure on the site and call 911. MAKE SURE YOU:  Understand these instructions.  Will watch your condition.  Will get help right away if you are not doing well or get worse. Document Released: 06/26/2005 Document Revised: 04/24/2014 Document Reviewed: 05/02/2013 [facility] Patient Information 2015 [place], [place]. This information is not intended to replace advice given to you by your health care provider. Make sure you discuss any questions you have with your health care provider.   Smoking Cessation Quitting smoking is important to your health and has many advantages. However, it is not always easy to quit since nicotine is a very addictive drug. Oftentimes, people try 3 times or more before being able to quit. This document explains the best ways for you to prepare to quit smoking. Quitting takes hard work and a lot of effort, but you can do it. ADVANTAGES OF QUITTING SMOKING  You will live longer, feel better, and live better.  Your body will feel the impact of quitting smoking almost immediately.  Within 20 minutes, blood pressure decreases. Your pulse returns to its normal level.  After 8 hours, carbon monoxide levels in the blood return to normal. Your  oxygen level increases.  After 24 hours, the chance of having a heart attack starts to decrease. Your breath, hair, and body stop smelling like smoke.  After 48 hours, damaged nerve endings begin to recover. Your sense of taste and smell improve.  After 72 hours, the body is virtually free of nicotine. Your bronchial tubes relax and breathing becomes easier.  After 2 to 12 weeks, lungs can hold more air.  Exercise becomes easier and circulation improves.  The risk of having a heart attack, stroke, cancer, or lung disease is greatly reduced.  After 1 year, the risk of coronary heart disease is cut in half.  After 5 years, the risk of stroke falls to the same as a nonsmoker.  After 10 years, the risk of lung cancer is cut in half and the risk of other cancers decreases significantly.  After 15 years, the risk of coronary heart disease drops, usually to the level of a nonsmoker.  If you are pregnant, quitting smoking will improve your chances of having a healthy baby.  The people you live with, especially any children, will be healthier.  You will have extra money to spend on things other than cigarettes. QUESTIONS TO THINK ABOUT BEFORE ATTEMPTING TO QUIT You may want to talk about your answers with your health care provider.  Why do you want to quit?  If you tried to quit in the past, what helped and what did not?  What will be the most difficult situations for you after you quit? How will you plan to handle them?  Who can help you through the tough times? Your family? Friends? A health care provider?  What pleasures do you get from smoking? What ways can you still get pleasure if you quit? Here are some questions to ask your health care provider:  How can you help me to be successful at quitting?  What medicine do you think would be best for me and how should I take it?  What should I do if I need more help?  What is smoking withdrawal like? How can I get information on withdrawal? GET READY  Set a quit date.  Change your environment by getting rid of all cigarettes, ashtrays, matches, and lighters in your home, car, or work. Do not let people smoke in your home.  Review your past attempts to quit. Think about what worked and what did not. GET SUPPORT AND ENCOURAGEMENT You have a better chance of being successful if you have help. You can get support in many ways.  Tell  your family, friends, and coworkers that you are going to quit and need their support. Ask them not to smoke around you.  Get individual, group, or telephone counseling and support. Programs are available at [facility] and [facility]. Call your local health department for information about programs in your area.  Spiritual beliefs and practices may help some smokers quit.  Download a "quit meter" on your computer to keep track of quit statistics, such as how long you have gone without smoking, cigarettes not smoked, and money saved.  Get a self-help book about quitting smoking and staying off tobacco. LEARN NEW SKILLS AND BEHAVIORS  Distract yourself from urges to smoke. Talk to someone, go for a walk, or occupy your time with a task.  Change your normal routine. Take a different route to work. Drink tea instead of coffee. Eat breakfast in a different place.  Reduce your stress. Take a hot bath, exercise, or  read a book.  Plan something enjoyable to do every day. Reward yourself for not smoking.  Explore interactive web-based programs that specialize in helping you quit. GET MEDICINE AND USE IT CORRECTLY Medicines can help you stop smoking and decrease the urge to smoke. Combining medicine with the above behavioral methods and support can greatly increase your chances of successfully quitting smoking.  Nicotine replacement therapy helps deliver nicotine to your body without the negative effects and risks of smoking. Nicotine replacement therapy includes nicotine gum, lozenges, inhalers, nasal sprays, and skin patches. Some may be available over-the-counter and others require a prescription.  Antidepressant medicine helps people abstain from smoking, but how this works is unknown. This medicine is available by prescription.  Nicotinic receptor partial agonist medicine simulates the effect of nicotine in your brain. This medicine is available by prescription. Ask your health care  provider for advice about which medicines to use and how to use them based on your health history. Your health care provider will tell you what side effects to look out for if you choose to be on a medicine or therapy. Carefully read the information on the package. Do not use any other product containing nicotine while using a nicotine replacement product.  RELAPSE OR DIFFICULT SITUATIONS Most relapses occur within the first 3 months after quitting. Do not be discouraged if you start smoking again. Remember, most people try several times before finally quitting. You may have symptoms of withdrawal because your body is used to nicotine. You may crave cigarettes, be irritable, feel very hungry, cough often, get headaches, or have difficulty concentrating. The withdrawal symptoms are only temporary. They are strongest when you first quit, but they will go away within 10-14 days. To reduce the chances of relapse, try to:  Avoid drinking alcohol. Drinking lowers your chances of successfully quitting.  Reduce the amount of caffeine you consume. Once you quit smoking, the amount of caffeine in your body increases and can give you symptoms, such as a rapid heartbeat, sweating, and anxiety.  Avoid smokers because they can make you want to smoke.  Do not let weight gain distract you. Many smokers will gain weight when they quit, usually less than 10 pounds. Eat a healthy diet and stay active. You can always lose the weight gained after you quit.  Find ways to improve your mood other than smoking. FOR MORE INFORMATION  www.smokefree.gov  Document Released: 12/02/2001 Document Revised: 04/24/2014 Document Reviewed: 03/18/2012 [facility] Patient Information 2015 [place], [place]. This information is not intended to replace advice given to you by your health care provider. Make sure you discuss any questions you have with your health care provider.

## 2015-03-07 NOTE — Interval H&P Note (Signed)
History and Physical Interval Note:  03/07/2015 10:19 AM  Lonnie [name]  has presented today for surgery, with the diagnosis of pad  The various methods of treatment have been discussed with the patient and family. After consideration of risks, benefits and other options for treatment, the patient has consented to  Procedure(s): ABDOMINAL AORTAGRAM (N/A) as a surgical intervention .  The patient's history has been reviewed, patient examined, no change in status, stable for surgery.  I have reviewed the patient's chart and labs.  Questions were answered to the patient's satisfaction.     Lorine BearsMuhammad Jahmir Salo

## 2015-03-07 NOTE — H&P (View-Only) (Signed)
Primary cardiologist: [name]  History of Present Illness: This is a very pleasant 64 year old man who is here today for a followup visit regarding peripheral arterial disease. He has known history of tobacco abuse, HTN and CAD . He [facility] with anterior STEMI and found to have moderately severe left main and LAD stenosis, moderately severe RCA stenosis. He underwent 4V CABG on 11/04/13 per Dr. Dorris FetchHendrickson (LIMA to mid LAD, SVG to OM, SVG to PDA, SVG to Diagonal). LVEF=40-45% prior to bypass.   He was seen in April for bilateral calf claudication worse on the left side.  Noninvasive evaluation showed an ABI of 0.78 on the right and 0.41 on the left with evidence of left distal SFA occlusion and significant disease in the right SFA. I proceeded with lower extremity angiography in 05/2014 which showed: 1. No significant aortoiliac disease.  2. Occluded left distal SFA into the proximal popliteal artery with 2 vessel runoff below the knee.  3. Possible distal right SFA stenosis with 1 vessel runoff below the knee.   I performed successful angioplasty and self-expanding stent placement to the left SFA with 2 Supera stents. Noninvasive evaluation in 11/2014 showed normal ABI bilaterally but moderate instent restenosis in left SFA with peak velocity of 256.  Initially, he did not have any left lower extremity discomfort. However, over the last few weeks, he has experienced significant left foot and calf pain and has stopped doing much physical activities because of that. He is also having numbness and dark discoloration.   Primary Care Physician: Dr. Earlene Plateravis ([facility])  Last Lipid Profile:Lipid Panel     Component Value Date/Time   CHOL 117 01/02/2015 0836   TRIG 43.0 01/02/2015 0836   HDL 44.70 01/02/2015 0836   CHOLHDL 3 01/02/2015 0836   VLDL 8.6 01/02/2015 0836   LDLCALC 64 01/02/2015 0836     Past Medical History  Diagnosis Date    . Hypertension   . Tobacco abuse   . Ischemic cardiomyopathy   . CAD (coronary artery disease)   . MI (myocardial infarction)   . PVD (peripheral vascular disease)     thighs  . Allergy     Past Surgical History  Procedure Laterality Date  . Toe surgery    . Appendectomy    . Knee surgery      fractured patella  . Mouth surgery    . Coronary artery bypass graft N/A 11/04/2013    Procedure: CORONARY ARTERY BYPASS GRAFTING (CABG) TIMES FOUR  USING LEFT INTERNAL MAMMARY ARTERY AND RIGHT AND LEFT SAPHENOUS LEG VEIN HARVESTED ENDOSCOPICALLY;  Surgeon: Loreli SlotSteven C Hendrickson, MD;  [facility];  Service: Open Heart Surgery;  Laterality: N/A;  . Left heart cath N/A 10/29/2013    Procedure: LEFT HEART CATH;  Surgeon: Kathleene Hazelhristopher D McAlhany, MD;  [facility];  Service: Cardiovascular;  Laterality: N/A;  . Left heart catheterization with coronary angiogram N/A 10/31/2013    Procedure: LEFT HEART CATHETERIZATION WITH CORONARY ANGIOGRAM;  Surgeon: Kathleene Hazelhristopher D McAlhany, MD;  [facility];  Service: Cardiovascular;  Laterality: N/A;  . Abdominal aortagram N/A 06/07/2014    Procedure: ABDOMINAL Ronny FlurryAORTAGRAM;  Surgeon: Iran OuchMuhammad A Arida, MD;  Location: [facility] CATH LAB;  Service: Cardiovascular;  Laterality: N/A;    Current Outpatient Prescriptions  Medication Sig Dispense Refill  . ACAI PO Take 50 mg by mouth 4 (four) times daily. By mouth    . aspirin EC 81  MG tablet Take 1 tablet (81 mg total) by mouth daily. 90 tablet 3  . atorvastatin (LIPITOR) 80 MG tablet Take 1 tablet by mouth  daily at 6pm 90 tablet 0  . cilostazol (PLETAL) 50 MG tablet Take 1 tablet by mouth twice daily (resume after completing course of Effient) 180 tablet 3  . Coenzyme Q10 (CO Q 10 PO) Take 100 mg by mouth daily.     Marland Kitchen docusate sodium (COLACE) 250 MG capsule Take 250 mg by mouth daily.    Marland Kitchen guaiFENesin (MUCINEX) 600 MG 12 hr tablet Take by mouth 2 (two) times daily.    . hydrocortisone 2.5 % cream Apply  topically 2 (two) times daily. 60 g 1  . irbesartan (AVAPRO) 150 MG tablet Take 1 tablet (150 mg total) by mouth daily. 90 tablet 3  . ketoconazole (NIZORAL) 2 % cream Apply 1 application topically 2 (two) times daily. 60 g 1  . metoprolol tartrate (LOPRESSOR) 25 MG tablet Take 1 tablet by mouth two  times daily 180 tablet 0  . nitroGLYCERIN (NITROSTAT) 0.4 MG SL tablet Place 1 tablet (0.4 mg total) under the tongue every 5 (five) minutes as needed for chest pain. 25 tablet 6  . Vitamins-Lipotropics (B-50 PO) Take 1 tablet by mouth daily.      No current facility-administered medications for this visit.    Allergies  Allergen Reactions  . Ace Inhibitors Swelling    Angioedema  . Dairy Aid [Lactase]   . Eggs Or Egg-Derived Products     Cannot eat Prepared Eggs  . Peanut-Containing Drug Products     Does not know which nuts, but states nuts make him vomit    History   Social History  . Marital Status: Married    Spouse Name: [place]  . Number of Children: 1  . Years of Education: N/A   Occupational History  . long term disability --Photographer   Social History Main Topics  . Smoking status: Current Every Day Smoker -- 0.25 packs/day for 30 years    Types: Cigarettes  . Smokeless tobacco: Never Used     Comment: < 1/2 ppd  . Alcohol Use: No  . Drug Use: No  . Sexual Activity: Yes   Other Topics Concern  . Not on file   Social History Narrative   Household-- pt and wife   1 daughter     Family History  Problem Relation Age of Onset  . CAD Father 76  . AAA (abdominal aortic aneurysm) Father   . Alcohol abuse Father   . Hypertension Father   . Diabetes Father   . Cardiomyopathy Daughter   . Stroke Mother   . Arthritis Mother   . Heart disease Mother   . Hypertension Mother   . Colon cancer Neg Hx   . Prostate cancer Neg Hx     Review of Systems:  As stated in the HPI and otherwise negative.   BP 138/88 mmHg  Pulse 64  Ht  (1.854 m)  Wt 182  lb (82.555 kg)  BMI 24.02 kg/m2  Physical Examination: General: Well developed, well nourished, NAD HEENT: OP clear, mucus membranes moist SKIN: warm, dry. No rashes. Neuro: No focal deficits Musculoskeletal: Muscle strength 5/5 all ext Psychiatric: Mood and affect normal Neck: No JVD, no carotid bruits, no thyromegaly, no lymphadenopathy. Lungs:Clear bilaterally, no wheezes, rhonci, crackles Cardiovascular: Regular rate and rhythm. No murmurs, gallops or rubs. Abdomen:Soft. Bowel sounds present. Non-tender.  Extremities: No  lower extremity edema.  vascular: Femoral pulses are normal bilaterally. Posterior tibial: +2 on the right and absent on the left side. Dorsalis pedis: Normal on the right side and absent on the left side     Assessment and Plan:    1. peripheral arterial disease : The patient is having severe recurrent claudication in the left foot and calf likely due to progression of in-stent restenosis. He has not been able to do much activities because of this and he has noted dark discoloration in the foot as well. Distal pulses are not palpable. Due to all of that, I recommend proceeding with abdominal aortogram with left lower extremity arterial angiography and possible endovascular intervention. Risks, benefits and alternatives were discussed with the patient.  2. CAD: Stable s/p CABG November 2014. . Doing well. Continue ASA, statin, beta blocker.   3. Ischemic Cardiomyopathy: Echo 12/20/13 which was 6 weeks post CABG shows LVEF=35-40%.

## 2015-03-21 ENCOUNTER — Other Ambulatory Visit: Payer: Self-pay | Admitting: Cardiovascular Disease

## 2015-03-28 ENCOUNTER — Other Ambulatory Visit: Payer: Self-pay

## 2015-04-05 ENCOUNTER — Telehealth: Payer: Self-pay | Admitting: Cardiovascular Disease

## 2015-04-05 NOTE — Telephone Encounter (Signed)
New message         Pt has been in extreme pain (ankle and foot)   pt tried a medication and feels strange when he takes it (pt wife does not know name of pill)   please give pt wife a call

## 2015-04-05 NOTE — Telephone Encounter (Signed)
The pt's wife called the office while the pt was outside to make us aware of some concerns.  She wanted to make us aware that while the pt is asleep during the night he is "beating" his foot and ankle.  When she questions him about this he said that he does not remember doing this and says that it is because of the pain in his leg. She said the pt continues to smoke and stopped chantix because it made him feel weird. She states that the pt is smoking more now than ever before. She also said the pt is having issues with grieving (did not get to go through grieving process when his mother passed away and did not get to have last flight with [place]).  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  We will see the pt for appointment on 04/10/15 with Dr Kirke CorinArida.

## 2015-04-10 ENCOUNTER — Ambulatory Visit (INDEPENDENT_AMBULATORY_CARE_PROVIDER_SITE_OTHER): Payer: 59 | Admitting: Cardiovascular Disease

## 2015-04-10 ENCOUNTER — Encounter: Payer: Self-pay | Admitting: Cardiovascular Disease

## 2015-04-10 VITALS — BP 118/70 | HR 70 | Ht 73.75 in | Wt 179.4 lb

## 2015-04-10 DIAGNOSIS — I739 Peripheral vascular disease, unspecified: Secondary | ICD-10-CM

## 2015-04-10 NOTE — Patient Instructions (Signed)
Medication Instructions:  Your physician recommends that you continue on your current medications as directed. Please refer to the Current Medication list given to you today.  Labwork: No new orders.  Testing/Procedures: No new orders.  Follow-Up: Your physician wants you to follow-up in: 6 MONTHS with Dr Kirke CorinArida.  You will receive a reminder letter in the mail two months in advance. If you don't receive a letter, please call our office to schedule the follow-up appointment.  Any Other Special Instructions Will Be Listed Below (If Applicable).  You have been referred to [facility] for evaluation of callus.

## 2015-04-14 NOTE — Progress Notes (Signed)
Primary cardiologist: Dr. Clifton James  History of Present Illness: This is a very pleasant 64 year old man who is here today for a followup visit regarding peripheral arterial disease. He has known history of tobacco abuse, HTN and CAD . He was admitted to [facility] 10/29/13 with anterior STEMI and found to have moderately severe left main and LAD stenosis, moderately severe RCA stenosis. He underwent 4V CABG on 11/04/13 per Dr. Dorris Fetch (LIMA to mid LAD, SVG to OM, SVG to PDA, SVG to Diagonal). LVEF=40-45% prior to bypass.   He was seen in April,2015 for bilateral calf claudication worse on the left side.  Noninvasive evaluation showed an ABI of 0.78 on the right and 0.41 on the left with evidence of left distal SFA occlusion and significant disease in the right SFA. I proceeded with lower extremity angiography in 05/2014 which showed: 1. No significant aortoiliac disease.  2. Occluded left distal SFA into the proximal popliteal artery with 2 vessel runoff below the knee.  3. Possible distal right SFA stenosis with 1 vessel runoff below the knee.   I performed successful angioplasty and self-expanding stent placement to the left SFA with 2 Supera stents. He was seen recently for recurrent left calf claudication. I proceeded with repeat angiography which showed occluded mid to distal left SFA within the stent with reasonable collaterals. Unfortunately, he continues to smoke and he has been under significant stress related to prolonged grief after the loss of his mother as well as difficulty in adjusting to not working. He used to be a Occupational hygienist.   Primary Care Physician: Dr. Earlene Plater ([facility])  Last Lipid Profile:Lipid Panel     Component Value Date/Time   CHOL 117 01/02/2015 0836   TRIG 43.0 01/02/2015 0836   HDL 44.70 01/02/2015 0836   CHOLHDL 3 01/02/2015 0836   VLDL 8.6 01/02/2015 0836   LDLCALC 64 01/02/2015 0836     Past Medical History  Diagnosis Date  .  Hypertension   . Tobacco abuse   . Ischemic cardiomyopathy   . CAD (coronary artery disease)   . MI (myocardial infarction)   . PVD (peripheral vascular disease)     thighs  . Allergy     Past Surgical History  Procedure Laterality Date  . Toe surgery    . Appendectomy    . Knee surgery      fractured patella  . Mouth surgery    . Coronary artery bypass graft N/A 11/04/2013    Procedure: CORONARY ARTERY BYPASS GRAFTING (CABG) TIMES FOUR  USING LEFT INTERNAL MAMMARY ARTERY AND RIGHT AND LEFT SAPHENOUS LEG VEIN HARVESTED ENDOSCOPICALLY;  Surgeon: Loreli Slot, MD;  Location: [facility] OR;  Service: Open Heart Surgery;  Laterality: N/A;  . Left heart cath N/A 10/29/2013    Procedure: LEFT HEART CATH;  Surgeon: Kathleene Hazel, MD;  Location: [facility] CATH LAB;  Service: Cardiovascular;  Laterality: N/A;  . Left heart catheterization with coronary angiogram N/A 10/31/2013    Procedure: LEFT HEART CATHETERIZATION WITH CORONARY ANGIOGRAM;  Surgeon: Kathleene Hazel, MD;  Location: [facility] CATH LAB;  Service: Cardiovascular;  Laterality: N/A;  . Abdominal aortagram N/A 06/07/2014    Procedure: ABDOMINAL Ronny Flurry;  Surgeon: Iran Ouch, MD;  Location: [facility] CATH LAB;  Service: Cardiovascular;  Laterality: N/A;  . Abdominal aortagram N/A 03/07/2015    Procedure: ABDOMINAL Ronny Flurry;  Surgeon: Iran Ouch, MD;  Location: [facility] CATH LAB;  Service: Cardiovascular;  Laterality: N/A;    Current  Outpatient Prescriptions  Medication Sig Dispense Refill  . aspirin EC 81 MG tablet Take 1 tablet (81 mg total) by mouth daily. 90 tablet 3  . atorvastatin (LIPITOR) 80 MG tablet Take 1 tablet by mouth  daily at 6pm 90 tablet 0  . cilostazol (PLETAL) 100 MG tablet Take 1 tablet (100 mg total) by mouth 2 (two) times daily. 60 tablet 6  . hydrocortisone 2.5 % cream Apply topically 2 (two) times daily. 60 g 1  . irbesartan (AVAPRO) 150 MG tablet Take 1 tablet (150 mg total) by mouth daily. 90 tablet 3    . ketoconazole (NIZORAL) 2 % cream Apply 1 application topically 2 (two) times daily. 60 g 1  . metoprolol tartrate (LOPRESSOR) 25 MG tablet Take 1 tablet by mouth two  times daily 180 tablet 0  . nitroGLYCERIN (NITROSTAT) 0.4 MG SL tablet Place 1 tablet (0.4 mg total) under the tongue every 5 (five) minutes as needed for chest pain. 25 tablet 6   No current facility-administered medications for this visit.    Allergies  Allergen Reactions  . Ace Inhibitors Swelling    Angioedema  . Dairy Aid [Lactase]   . Eggs Or Egg-Derived Products     Cannot eat Prepared Eggs  . Peanut-Containing Drug Products Nausea And Vomiting    Does not know which nuts, but states nuts make him vomit    History   Social History  . Marital Status: Married    Spouse Name: [place]fieldVenice  . Number of Children: 1  . Years of Education: N/A   Occupational History  . 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 Smoking status: Current Every Day Smoker -- 0.25 packs/day for 30 years    Types: Cigarettes  . Smokeless tobacco: Never Used     Comment: < 1/2 ppd  . Alcohol Use: No  . Drug Use: No  . Sexual Activity: Yes   Other Topics Concern  . Not on file   Social History Narrative   Household-- pt and wife   1 daughter     Family History  Problem Relation Age of Onset  . CAD Father 469  . AAA (abdominal aortic aneurysm) Father   . Alcohol abuse Father   . Hypertension Father   . Diabetes Father   . Cardiomyopathy Daughter   . Stroke Mother   . Arthritis Mother   . Heart disease Mother   . Hypertension Mother   . Colon cancer Neg Hx   . Prostate cancer Neg Hx     Review of Systems:  As stated in the HPI and otherwise negative.   BP 118/70 mmHg  Pulse 70  Ht 6' 1.75" (1.873 m)  Wt 179 lb 6.4 oz (81.375 kg)  BMI 23.20 kg/m2  SpO2 97%  Physical Examination: General: Well developed, well nourished, NAD HEENT: OP clear, mucus membranes moist SKIN: warm, dry.  No rashes. Neuro: No focal deficits Musculoskeletal: Muscle strength 5/5 all ext Psychiatric: Mood and affect normal Neck: No JVD, no carotid bruits, no thyromegaly, no lymphadenopathy. Lungs:Clear bilaterally, no wheezes, rhonci, crackles Cardiovascular: Regular rate and rhythm. No murmurs, gallops or rubs. Abdomen:Soft. Bowel sounds present. Non-tender.  Extremities: No lower extremity edema.  vascular: Femoral pulses are normal bilaterally. Posterior tibial: +2 on the right and absent on the left side. Dorsalis pedis: Normal on the right side and absent on the left side. no groin hematoma    Assessment and Plan:  1. peripheral arterial disease : Unfortunately, the patient had occlusion of previously placed distal SFA stents. Continued smoking likely played a role in this. His claudication does not seem to be lifestyle limiting at the present time. He does have callus formation and reports foot arch problems. Thus, I referred him to podiatry and instructed him on proper foot hygiene given the presence of peripheral arterial disease.    2. CAD: Stable s/p CABG November 2014. . Doing well. Continue ASA, statin, beta blocker.   3. Ischemic Cardiomyopathy: Echo 12/20/13 which was 6 weeks post CABG shows LVEF=35-40%.   4. Depression and anxiety : This seems to be significantly contributing to continued tobacco use as well as difficulty in adjusting to his new health and occupational situation. I asked him to follow-up with Dr. Drue Novel   about this. He might require some form of treatment.

## 2015-04-17 ENCOUNTER — Ambulatory Visit: Payer: 59 | Admitting: Internal Medicine

## 2015-04-19 ENCOUNTER — Encounter: Payer: Self-pay | Admitting: Internal Medicine

## 2015-04-19 ENCOUNTER — Other Ambulatory Visit: Payer: Self-pay

## 2015-04-19 ENCOUNTER — Ambulatory Visit (INDEPENDENT_AMBULATORY_CARE_PROVIDER_SITE_OTHER): Payer: 59 | Admitting: Internal Medicine

## 2015-04-19 VITALS — BP 126/78 | HR 76 | Temp 98.2°F | Ht 74.0 in | Wt 181.4 lb

## 2015-04-19 DIAGNOSIS — Z720 Tobacco use: Secondary | ICD-10-CM

## 2015-04-19 DIAGNOSIS — L309 Dermatitis, unspecified: Secondary | ICD-10-CM | POA: Diagnosis not present

## 2015-04-19 MED ORDER — BETAMETHASONE DIPROPIONATE AUG 0.05 % EX CREA: TOPICAL_CREAM | Freq: Two times a day (BID) | CUTANEOUS | Status: DC

## 2015-04-19 MED ORDER — TERBINAFINE HCL 1 % EX CREA: 1.0000 "application " | TOPICAL_CREAM | Freq: Every day | CUTANEOUS | Status: DC

## 2015-04-19 MED ORDER — BUPROPION HCL 100 MG PO TABS: 100.0000 mg | ORAL_TABLET | Freq: Two times a day (BID) | ORAL | Status: DC

## 2015-04-19 MED ORDER — FLUCONAZOLE 150 MG PO TABS: 150.0000 mg | ORAL_TABLET | ORAL | Status: DC

## 2015-04-19 NOTE — Progress Notes (Signed)
Pre visit review using our clinic review tool, if applicable. No additional management support is needed unless otherwise documented below in the visit note. 

## 2015-04-19 NOTE — Assessment & Plan Note (Signed)
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 --He does have onychomycosis, will take 3 months of oral therapy cure , will first see how he does with Diflucan.

## 2015-04-19 NOTE — Patient Instructions (Addendum)
Apply diprolene to the top of your left foot daily Apply Lamisil to the plantar area of the left foot daily  Take Diflucan 1 tablet every Monday  for 4 weeks Take only half atorvastatin Monday Tuesday and Wednesday the weeks you take Diflucan, they may interact.  Quitting tobacco Visit the american Heart Association website Start Wellbutrin 100 mg one tablet a day for one week, then increase to one tablet twice a day Stop tobacco 2 weeks after you start Wellbutrin Okay to use the patch or the gum  Next visit in one month

## 2015-04-19 NOTE — Assessment & Plan Note (Signed)
In the past was intolerant to Chantix. wellbutrin, prescription provided. See instructions

## 2015-04-19 NOTE — Progress Notes (Signed)
Subjective:    Patient ID: Lonnie [name], male    DOB: 12-04-1951, 64 y.o.   MRN: [id number]  DOS:  04/19/2015 Type of visit - description : acute, several issues Interval history: Foot rash  Improved  but not completely gone, still itching. Wants to stop tobacco Has developed some numbness and the plantar left foot, asymptomatic  on the right foot Recently saw cardiology, note reviewed.   Review of Systems   Past Medical History  Diagnosis Date  . Hypertension   . Tobacco abuse   . Ischemic cardiomyopathy   . CAD (coronary artery disease)   . MI (myocardial infarction)   . PVD (peripheral vascular disease)     thighs  . Allergy     Past Surgical History  Procedure Laterality Date  . Toe surgery    . Appendectomy    . Knee surgery      fractured patella  . Mouth surgery    . Coronary artery bypass graft N/A 11/04/2013    Procedure: CORONARY ARTERY BYPASS GRAFTING (CABG) TIMES FOUR  USING LEFT INTERNAL MAMMARY ARTERY AND RIGHT AND LEFT SAPHENOUS LEG VEIN HARVESTED ENDOSCOPICALLY;  Surgeon: Loreli SlotSteven C Hendrickson, MD;  [facility];  Service: Open Heart Surgery;  Laterality: N/A;  . Left heart cath N/A 10/29/2013    Procedure: LEFT HEART CATH;  Surgeon: Kathleene Hazelhristopher D McAlhany, MD;  [facility];  Service: Cardiovascular;  Laterality: N/A;  . Left heart catheterization with coronary angiogram N/A 10/31/2013    Procedure: LEFT HEART CATHETERIZATION WITH CORONARY ANGIOGRAM;  Surgeon: Kathleene Hazelhristopher D McAlhany, MD;  [facility];  Service: Cardiovascular;  Laterality: N/A;  . Abdominal aortagram N/A 06/07/2014    Procedure: ABDOMINAL Ronny FlurryAORTAGRAM;  Surgeon: Iran OuchMuhammad A Arida, MD;  Location: [facility] CATH LAB;  Service: Cardiovascular;  Laterality: N/A;  . Abdominal aortagram N/A 03/07/2015    Procedure: ABDOMINAL Ronny FlurryAORTAGRAM;  Surgeon: Iran OuchMuhammad A Arida, MD;  Location: [facility] CATH LAB;  Service: Cardiovascular;  Laterality: N/A;    History   Social History  . Marital  Status: Married    Spouse Name: [place]ice  . Number of Children: 1  . Years of Education: N/A   Occupational History  . 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 Smoking status: Current Every Day Smoker -- 0.25 packs/day for 30 years    Types: Cigarettes  . Smokeless tobacco: Never Used     Comment: < 1/2 ppd  . Alcohol Use: No  . Drug Use: No  . Sexual Activity: Yes   Other Topics Concern  . Not on file   Social History Narrative   Household-- pt and wife   1 daughter         Medication List       This list is accurate as of: 04/19/15  9:18 AM.  Always use your most recent med list.               aspirin EC 81 MG tablet  Take 1 tablet (81 mg total) by mouth daily.     atorvastatin 80 MG tablet  Commonly known as:  LIPITOR  Take 1 tablet by mouth  daily at 6pm     augmented betamethasone dipropionate 0.05 % cream  Commonly known as:  DIPROLENE-AF  Apply topically 2 (two) times daily.     buPROPion 100 MG tablet  Commonly known as:  WELLBUTRIN  Take 1 tablet (100 mg total) by mouth 2 (  two) times daily.     cilostazol 100 MG tablet  Commonly known as:  PLETAL  Take 1 tablet (100 mg total) by mouth 2 (two) times daily.     fluconazole 150 MG tablet  Commonly known as:  DIFLUCAN  Take 1 tablet (150 mg total) by mouth once a week.     hydrocortisone 2.5 % cream  Apply topically 2 (two) times daily.     irbesartan 150 MG tablet  Commonly known as:  AVAPRO  Take 1 tablet (150 mg total) by mouth daily.     metoprolol tartrate 25 MG tablet  Commonly known as:  LOPRESSOR  Take 1 tablet by mouth two  times daily     nitroGLYCERIN 0.4 MG SL tablet  Commonly known as:  NITROSTAT  Place 1 tablet (0.4 mg total) under the tongue every 5 (five) minutes as needed for chest pain.     terbinafine 1 % cream  Commonly known as:  LAMISIL  Apply 1 application topically daily.           Objective:   Physical Exam BP 126/78 mmHg   Pulse 76  Temp(Src) 98.2 F (36.8 C) (Oral)  Ht  (1.88 m)  Wt 181 lb 6 oz (82.271 kg)  BMI 23.28 kg/m2  SpO2 98% General:   Well developed, well nourished . NAD.  HEENT:  Normocephalic . Face symmetric, atraumatic   Muscle skeletal: no pretibial edema bilaterally   Skin: Not pale. Not jaundice Left foot: Dorsum slightly erythematous, patchy rash Plantar area, seems better, less dry, less scaly than before Nails dystrophic bilaterally Neurologic:  alert & oriented X3.  Speech normal, gait appropriate for age and unassisted Pinprick examination of the feet: Numbness on the left plantar area Psych--  Cognition and judgment appear intact.  Cooperative with normal attention span and concentration.  Behavior appropriate. No anxious or depressed appearing.        Assessment & Plan:  Today , I spent more than 25   min with the patient: >50% of the time counseling regards  the treatment plan, where/how to use the creams and to decrease dose of Lipitor temporarily. See written instructions Also discussing tobacco cessation and how to take the medication

## 2015-04-25 ENCOUNTER — Encounter: Payer: Self-pay | Admitting: Podiatry

## 2015-04-25 ENCOUNTER — Ambulatory Visit (INDEPENDENT_AMBULATORY_CARE_PROVIDER_SITE_OTHER): Payer: 59 | Admitting: Podiatry

## 2015-04-25 VITALS — BP 134/74 | HR 61 | Temp 98.6°F | Resp 14

## 2015-04-25 DIAGNOSIS — I739 Peripheral vascular disease, unspecified: Secondary | ICD-10-CM | POA: Diagnosis not present

## 2015-04-25 DIAGNOSIS — L84 Corns and callosities: Secondary | ICD-10-CM | POA: Diagnosis not present

## 2015-04-25 DIAGNOSIS — G629 Polyneuropathy, unspecified: Secondary | ICD-10-CM

## 2015-04-25 NOTE — Patient Instructions (Signed)
Follow-up for the vascular disease with Dr. Kennith MaesArrida at your scheduled visit Wear athletic style shoes Wear the shoe insole with the additional felt pad to offload the weight on the bottom of the left foot If you feel the pad is helpful could consider a custom foot orthotic with a pocket in that area

## 2015-04-25 NOTE — Progress Notes (Signed)
   Subjective:    Patient ID: Lonnie [name], male    DOB: 1951-05-29, 64 y.o.   MRN: [id number]  HPI N-numbness sometimes, burning and discoloration  L-left foot bottom,discoloration on top of foot D-swelling a year ago, discoloration  7-8 months, numbness 2 months ago O-slowly C-worse A-walking and wearing shoes  "Cramping and burning of left foot,is a discoloration and numbness and pain sometimes"  Review of Systems  Cardiovascular:       Left ankle swelling  Skin: Positive for color change.  Neurological: Positive for numbness.       Bottom of left foot  Psychiatric/Behavioral: Positive for sleep disturbance.   Denies history of foot ulceration History of peripheral arterial disease with lower extremity stenting and pending evaluation by vascular surgeon and June 2016  Retirded airline pilot    Objective:   Physical Exam  Orientated 3  Vascular: DP right trace palpable DP left 0/4 PT left 0/4 PT right 2/4 No edema noted bilaterally No calf tenderness bilaterally  rubor with the foot in dependency left   Neurological: Ankle reflex equal and reactive bilaterally Vibratory sensation intact bilaterally Sensation to 10 g monofilament wire intact 5/5 right and 0/5 left  Dermatological: Minimal plantar callus sub-left first MPJ Atrophic fad pad MPJ bilaterally  Musculoskeletal: Pes planus bilaterally Patient has stable gait     Assessment & Plan:   Assessment: Peripheral arterial disease with a history of vascular surgery in under evaluation by vascular surgeon Diabetic peripheral neuropathy Atrophic fat-pad MPJs bilaterally Plantar callus left  Plan: I discussed in detail with patient and wife the results of the finding of the examination today. I made him aware that the symptoms are related to the peripheral arterial disease as well as neuropathy. I discussed general diabetic foot care with patient today.  Attach felt pad to patient's current soft  insole to offload the plantar left first MPJ area If the pad was helpful could consider custom foot orthotics with accommodative  padding Advised to wear athletic style shoes Patient will have follow-up for his peripheral arterial disease with his schedule visit with his vascular surgeon  Reappoint when necessary or yearly   Plantar callus left

## 2015-04-26 ENCOUNTER — Encounter: Payer: Self-pay | Admitting: Podiatry

## 2015-05-08 ENCOUNTER — Other Ambulatory Visit: Payer: Self-pay | Admitting: Cardiovascular Disease

## 2015-05-14 ENCOUNTER — Other Ambulatory Visit: Payer: Self-pay | Admitting: Cardiovascular Disease

## 2015-05-15 ENCOUNTER — Ambulatory Visit: Payer: 59 | Admitting: Internal Medicine

## 2015-05-15 NOTE — Telephone Encounter (Signed)
Per note 4.19.16 

## 2015-05-16 ENCOUNTER — Other Ambulatory Visit: Payer: Self-pay

## 2015-05-17 ENCOUNTER — Ambulatory Visit: Payer: 59 | Admitting: Internal Medicine

## 2015-06-16 ENCOUNTER — Telehealth: Payer: Self-pay | Admitting: Internal Medicine

## 2015-06-16 NOTE — Telephone Encounter (Signed)
Patient was referred to GI few months ago, I don't see that he has an appointment, please follow-up on the issue

## 2015-06-18 ENCOUNTER — Telehealth: Payer: Self-pay | Admitting: Cardiovascular Disease

## 2015-06-18 NOTE — Telephone Encounter (Signed)
Please contact patient, I advise him to be seen by GI, let's try again

## 2015-06-18 NOTE — Telephone Encounter (Signed)
Dr. Drue Novel would like for Pt to see GI, will I need to place another referral for this?

## 2015-06-18 NOTE — Telephone Encounter (Signed)
FYI

## 2015-06-18 NOTE — Telephone Encounter (Signed)
GI referral was faxed to Dr. Kenna Gilbert office on 02/14/2015, did Pt ever get an appt?

## 2015-06-18 NOTE — Telephone Encounter (Signed)
Spoke with pt and gave him information from Dr. McAlhany 

## 2015-06-18 NOTE — Telephone Encounter (Signed)
Pt was never seen. He had a appointment. He came into the office, but left before being seen. Never call back to reschedule.

## 2015-06-18 NOTE — Telephone Encounter (Signed)
New Prob    Pt is requesting cardiac clearance to take a diving training class. Please call.

## 2015-06-18 NOTE — Telephone Encounter (Signed)
Spoke with pt. He has done snorkeling in the past but has never done scuba diving.  He is asking if OK with [name] for him to take scuba diving lessons.  Will forward to [name] for recommendations.

## 2015-06-18 NOTE — Telephone Encounter (Signed)
In general, we ask patients with obstructive CAD not to scuba dive and I believe many dive instructors will not allow them to take the class. Lonnie [name]

## 2015-06-19 NOTE — Telephone Encounter (Signed)
Noted  

## 2015-06-19 NOTE — Telephone Encounter (Signed)
No new referral is needed/pt will just need to call their office to reschedule his appt/lm on vm, awaiting return call

## 2015-07-02 ENCOUNTER — Ambulatory Visit (INDEPENDENT_AMBULATORY_CARE_PROVIDER_SITE_OTHER): Payer: 59 | Admitting: Internal Medicine

## 2015-07-02 ENCOUNTER — Encounter: Payer: Self-pay | Admitting: Internal Medicine

## 2015-07-02 VITALS — BP 130/78 | HR 49 | Temp 98.2°F | Ht 74.0 in | Wt 185.4 lb

## 2015-07-02 DIAGNOSIS — R7309 Other abnormal glucose: Secondary | ICD-10-CM | POA: Diagnosis not present

## 2015-07-02 DIAGNOSIS — Z720 Tobacco use: Secondary | ICD-10-CM | POA: Diagnosis not present

## 2015-07-02 DIAGNOSIS — R7303 Prediabetes: Secondary | ICD-10-CM

## 2015-07-02 DIAGNOSIS — I10 Essential (primary) hypertension: Secondary | ICD-10-CM | POA: Diagnosis not present

## 2015-07-02 DIAGNOSIS — L309 Dermatitis, unspecified: Secondary | ICD-10-CM

## 2015-07-02 LAB — BASIC METABOLIC PANEL
BUN: 22 mg/dL (ref 6–23)
CO2: 27 mEq/L (ref 19–32)
Calcium: 9.3 mg/dL (ref 8.4–10.5)
Chloride: 106 mEq/L (ref 96–112)
Creatinine, Ser: 1.14 mg/dL (ref 0.40–1.50)
GFR: 83.69 mL/min (ref >60.00)
Glucose, Bld: 108 mg/dL — ABNORMAL HIGH (ref 70–99)
Potassium: 4.4 mEq/L (ref 3.5–5.1)
Sodium: 139 mEq/L (ref 135–145)

## 2015-07-02 LAB — HEMOGLOBIN A1C: Hgb A1c MFr Bld: 6.1 % (ref 4.6–6.5)

## 2015-07-02 MED ORDER — BUPROPION HCL 100 MG PO TABS: 100.0000 mg | ORAL_TABLET | Freq: Two times a day (BID) | ORAL | Status: DC

## 2015-07-02 NOTE — Progress Notes (Signed)
Pre visit review using our clinic review tool, if applicable. No additional management support is needed unless otherwise documented below in the visit note. 

## 2015-07-02 NOTE — Assessment & Plan Note (Signed)
Due for a A1c 

## 2015-07-02 NOTE — Assessment & Plan Note (Signed)
Seems well-controlled, check a BMP, next visit 6 months

## 2015-07-02 NOTE — Assessment & Plan Note (Addendum)
Status post fluconazole, did not get to use the high potency steroid cream however rash is much better. He has residual scaliness by the left great toe, recommend OTC Lamisil

## 2015-07-02 NOTE — Progress Notes (Signed)
Subjective:    Patient ID: Lonnie [name], male    DOB: 04-03-1951, 64 y.o.   MRN: [id number]  DOS:  07/02/2015 Type of visit - description : Routine visit Interval history:  Dermatitis :did not get to use the steroid cream, he did take po  fluconazole. He is much improved. CAD, PVD: good compliance with medications.    Review of Systems  denies chest pain or difficulty breathing. No palpitations has chronic numbness at the lower extremities but that has decreased.  Past Medical History  Diagnosis Date  . Hypertension   . Tobacco abuse   . Ischemic cardiomyopathy   . CAD (coronary artery disease)   . MI (myocardial infarction)   . PVD (peripheral vascular disease)     thighs  . Allergy   . Prediabetes 09/01/2014    Past Surgical History  Procedure Laterality Date  . Toe surgery    . Appendectomy    . Knee surgery      fractured patella  . Mouth surgery    . Coronary artery bypass graft N/A 11/04/2013    Procedure: CORONARY ARTERY BYPASS GRAFTING (CABG) TIMES FOUR  USING LEFT INTERNAL MAMMARY ARTERY AND RIGHT AND LEFT SAPHENOUS LEG VEIN HARVESTED ENDOSCOPICALLY;  Surgeon: Loreli SlotSteven C Hendrickson, MD;  [facility];  Service: Open Heart Surgery;  Laterality: N/A;  . Left heart cath N/A 10/29/2013    Procedure: LEFT HEART CATH;  Surgeon: Kathleene Hazelhristopher D McAlhany, MD;  [facility];  Service: Cardiovascular;  Laterality: N/A;  . Left heart catheterization with coronary angiogram N/A 10/31/2013    Procedure: LEFT HEART CATHETERIZATION WITH CORONARY ANGIOGRAM;  Surgeon: Kathleene Hazelhristopher D McAlhany, MD;  [facility];  Service: Cardiovascular;  Laterality: N/A;  . Abdominal aortagram N/A 06/07/2014    Procedure: ABDOMINAL Ronny FlurryAORTAGRAM;  Surgeon: Iran OuchMuhammad A Arida, MD;  Location: [facility] CATH LAB;  Service: Cardiovascular;  Laterality: N/A;  . Abdominal aortagram N/A 03/07/2015    Procedure: ABDOMINAL Ronny FlurryAORTAGRAM;  Surgeon: Iran OuchMuhammad A Arida, MD;  Location: [facility] CATH LAB;  Service:  Cardiovascular;  Laterality: N/A;    History   Social History  . Marital Status: Married    Spouse Name: Magas Ar[place]  . Number of Children: 1  . Years of Education: N/A   Occupational History  . 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 Smoking status: Current Every Day Smoker -- 0.25 packs/day for 30 years    Types: Cigarettes  . Smokeless tobacco: Never Used     Comment: < 1/2 ppd  . Alcohol Use: No  . Drug Use: No  . Sexual Activity: Yes   Other Topics Concern  . Not on file   Social History Narrative   Household-- pt and wife   1 daughter         Medication List       This list is accurate as of: 07/02/15  5:41 PM.  Always use your most recent med list.               aspirin EC 81 MG tablet  Take 1 tablet (81 mg total) by mouth daily.     atorvastatin 80 MG tablet  Commonly known as:  LIPITOR  TAKE 1 TABLET BY MOUTH  DAILY AT 6PM     buPROPion 100 MG tablet  Commonly known as:  WELLBUTRIN  Take 1 tablet (100 mg total) by mouth 2 (two) times daily.     cilostazol 50  MG tablet  Commonly known as:  PLETAL  Take 1 tablet by mouth  twice daily (resume after  completing course of  Effient)     irbesartan 150 MG tablet  Commonly known as:  AVAPRO  Take 1 tablet by mouth  daily     metoprolol tartrate 25 MG tablet  Commonly known as:  LOPRESSOR  Take 1 tablet by mouth two  times daily     nitroGLYCERIN 0.4 MG SL tablet  Commonly known as:  NITROSTAT  Place 1 tablet (0.4 mg total) under the tongue every 5 (five) minutes as needed for chest pain.           Objective:   Physical Exam BP 130/78 mmHg  Pulse 49  Temp(Src) 98.2 F (36.8 C) (Oral)  Ht  (1.88 m)  Wt 185 lb 6 oz (84.086 kg)  BMI 23.79 kg/m2  SpO2 98% General:   Well developed, well nourished . NAD.  HEENT:  Normocephalic . Face symmetric, atraumatic Lungs:  CTA B Normal respiratory effort, no intercostal retractions, no accessory muscle  use. Heart: RRR,  no murmur.  No pretibial edema bilaterally  Skin:  feet skin normal except for mild scaliness by the left great toe.  Neurologic:  alert & oriented X3.  Speech normal, gait appropriate for age and unassisted Psych--  Cognition and judgment appear intact.  Cooperative with normal attention span and concentration.  Behavior appropriate. No anxious or depressed appearing.        Assessment & Plan:

## 2015-07-02 NOTE — Assessment & Plan Note (Signed)
Still smoking very little, 2 cigarettes a day, on Wellbutrin. Counseled again

## 2015-07-02 NOTE — Patient Instructions (Signed)
Get your blood work before you leave    

## 2015-08-14 ENCOUNTER — Other Ambulatory Visit: Payer: Self-pay | Admitting: Gastroenterology

## 2015-08-27 ENCOUNTER — Other Ambulatory Visit: Payer: Self-pay | Admitting: Internal Medicine

## 2015-09-11 ENCOUNTER — Other Ambulatory Visit: Payer: Self-pay | Admitting: Internal Medicine

## 2015-09-26 ENCOUNTER — Other Ambulatory Visit: Payer: Self-pay | Admitting: Cardiovascular Disease

## 2015-10-01 ENCOUNTER — Other Ambulatory Visit: Payer: Self-pay | Admitting: Internal Medicine

## 2015-10-01 NOTE — Progress Notes (Signed)
Chief Complaint  Patient presents with  . Follow-up     History of Present Illness: 64 yo male with history of tobacco abuse, HTN and CAD who is here today for cardiac follow up. He was admitted to [facility] 10/29/13 with anterior STEMI and found to have severe left main and LAD stenosis, moderately severe RCA stenosis. He underwent 4V CABG on 11/04/13 per Dr. Dorris Fetch (LIMA to mid LAD, SVG to OM, SVG to PDA, SVG to Diagonal). His post-operative course was uneventful. LVEF=40-45% prior to bypass. Left SFA stents per Dr. Kirke Corin in 2015. Repeat LE angiography March 2016 showed occlusion of the left SFA stents. He was not felt to have limiting claudication so he has been managed conservatively with ASA and Pletal. Ace-inh induced angioedema on 06/13/14.   He is here today for follow up. No chest pain or SOB. No palpitations. Still smoking 4-5 cigarettes per day. No leg pain with ambulation. He is not exercising.   Primary Care Physician: Dr. Earlene Plater ([facility])  Last Lipid Profile:Lipid Panel     Component Value Date/Time   CHOL 117 01/02/2015 0836   TRIG 43.0 01/02/2015 0836   HDL 44.70 01/02/2015 0836   CHOLHDL 3 01/02/2015 0836   VLDL 8.6 01/02/2015 0836   LDLCALC 64 01/02/2015 0836     Past Medical History  Diagnosis Date  . Hypertension   . Tobacco abuse   . Ischemic cardiomyopathy   . CAD (coronary artery disease)   . MI (myocardial infarction) (HCC)   . PVD (peripheral vascular disease) (HCC)     thighs  . Allergy   . Prediabetes 09/01/2014    Past Surgical History  Procedure Laterality Date  . Toe surgery    . Appendectomy    . Knee surgery      fractured patella  . Mouth surgery    . Coronary artery bypass graft N/A 11/04/2013    Procedure: CORONARY ARTERY BYPASS GRAFTING (CABG) TIMES FOUR  USING LEFT INTERNAL MAMMARY ARTERY AND RIGHT AND LEFT SAPHENOUS LEG VEIN HARVESTED ENDOSCOPICALLY;  Surgeon: Loreli Slot, MD;  Location: [facility] OR;   Service: Open Heart Surgery;  Laterality: N/A;  . Left heart cath N/A 10/29/2013    Procedure: LEFT HEART CATH;  Surgeon: Kathleene Hazel, MD;  Location: [facility] CATH LAB;  Service: Cardiovascular;  Laterality: N/A;  . Left heart catheterization with coronary angiogram N/A 10/31/2013    Procedure: LEFT HEART CATHETERIZATION WITH CORONARY ANGIOGRAM;  Surgeon: Kathleene Hazel, MD;  Location: [facility] CATH LAB;  Service: Cardiovascular;  Laterality: N/A;  . Abdominal aortagram N/A 06/07/2014    Procedure: ABDOMINAL Ronny Flurry;  Surgeon: Iran Ouch, MD;  Location: [facility] CATH LAB;  Service: Cardiovascular;  Laterality: N/A;  . Abdominal aortagram N/A 03/07/2015    Procedure: ABDOMINAL Ronny Flurry;  Surgeon: Iran Ouch, MD;  Location: [facility] CATH LAB;  Service: Cardiovascular;  Laterality: N/A;    Current Outpatient Prescriptions  Medication Sig Dispense Refill  . aspirin EC 81 MG tablet Take 1 tablet (81 mg total) by mouth daily. 90 tablet 3  . atorvastatin (LIPITOR) 80 MG tablet TAKE 1 TABLET BY MOUTH  DAILY AT 6PM 90 tablet 1  . buPROPion (WELLBUTRIN) 100 MG tablet Take 1 tablet (100 mg total) by mouth 2 (two) times daily. 90 tablet 2  . cilostazol (PLETAL) 100 MG tablet Take 1 tablet (100 mg total) by mouth 2 (two) times daily. 180 tablet 3  . irbesartan (AVAPRO) 150 MG tablet Take  1 tablet by mouth  daily 90 tablet 0  . metoprolol tartrate (LOPRESSOR) 25 MG tablet Take 1 tablet by mouth two  times daily 180 tablet 1  . nitroGLYCERIN (NITROSTAT) 0.4 MG SL tablet Place 1 tablet (0.4 mg total) under the tongue every 5 (five) minutes as needed for chest pain. 25 tablet 6  . OVER THE COUNTER MEDICATION Take 2 tablets by mouth daily. Arte-clear circulation supplement     No current facility-administered medications for this visit.    Allergies  Allergen Reactions  . Ace Inhibitors Swelling    Angioedema  . Dairy Aid [Lactase]   . Eggs Or Egg-Derived Products     Cannot eat Prepared Eggs  .  Peanut-Containing Drug Products Nausea And Vomiting    Does not know which nuts, but states nuts make him vomit    Social History   Social History  . Marital Status: Married    Spouse Name: Sumner  . Number of Children: 1  . Years of Education: N/A   Occupational History  . long term disability --Photographer   Social History Main Topics  . Smoking status: Current Every Day Smoker -- 0.25 packs/day for 30 years    Types: Cigarettes  . Smokeless tobacco: Never Used     Comment: < 1/2 ppd  . Alcohol Use: No  . Drug Use: No  . Sexual Activity: Yes   Other Topics Concern  . Not on file   Social History Narrative   Household-- pt and wife   1 daughter     Family History  Problem Relation Age of Onset  . CAD Father 21  . AAA (abdominal aortic aneurysm) Father   . Alcohol abuse Father   . Hypertension Father   . Diabetes Father   . Cardiomyopathy Daughter   . Stroke Mother   . Arthritis Mother   . Heart disease Mother   . Hypertension Mother   . Colon cancer Neg Hx   . Prostate cancer Neg Hx   . Heart attack Mother   . Heart attack Father     Review of Systems:  As stated in the HPI and otherwise negative.   BP 125/72 mmHg  Pulse 70  Ht 6' (1.829 m)  Wt 180 lb (81.647 kg)  BMI 24.41 kg/m2  SpO2 98%  Physical Examination: General: Well developed, well nourished, NAD HEENT: OP clear, mucus membranes moist SKIN: warm, dry. No rashes. Neuro: No focal deficits Musculoskeletal: Muscle strength 5/5 all ext Psychiatric: Mood and affect normal Neck: No JVD, no carotid bruits, no thyromegaly, no lymphadenopathy. Lungs:Clear bilaterally, no wheezes, rhonci, crackles Cardiovascular: Regular rate and rhythm. No murmurs, gallops or rubs. Abdomen:Soft. Bowel sounds present. Non-tender.  Extremities: No lower extremity edema. Pulses are 2 + in the right DP/PT and trace left DP/PT.   Echo 12/20/13: Left ventricle: Diffuse hypokinesis with abnormal  septal motion EF somewhat hard to judge as patient had frequent bigemminy The cavity size was moderately dilated. Wall thickness was increased in a pattern of mild LVH. Systolic function was moderately reduced. The estimated ejection fraction was in the range of 35% to 40%. - Left atrium: The atrium was mildly dilated. - Atrial septum: No defect or patent foramen ovale was identified. - Pulmonary arteries: PA peak pressure: 31mm Hg (S).  EKG:  EKG is not ordered today. The ekg ordered today demonstrates   Recent Labs: 02/12/2015: ALT 40 03/01/2015: Hemoglobin 14.4; Platelets 211 07/02/2015: BUN 22; Creatinine, Ser 1.14; Potassium  4.4; Sodium 139   Lipid Panel    Component Value Date/Time   CHOL 117 01/02/2015 0836   TRIG 43.0 01/02/2015 0836   HDL 44.70 01/02/2015 0836   CHOLHDL 3 01/02/2015 0836   VLDL 8.6 01/02/2015 0836   LDLCALC 64 01/02/2015 0836     Wt Readings from Last 3 Encounters:  10/02/15 180 lb (81.647 kg)  07/02/15 185 lb 6 oz (84.086 kg)  04/19/15 181 lb 6 oz (82.271 kg)     Other studies Reviewed: Additional studies/ records that were reviewed today include: . Review of the above records demonstrates:    Assessment and Plan:   1. CAD: Stable s/p CABG November 2014. . Doing well. Continue ASA, statin, beta blocker, ARB.   2. Ischemic Cardiomyopathy: Echo 12/20/13 which was 6 weeks post CABG shows LVEF=35-40%. Will continue current meds. Since he had angioedema presumed to be due to Lisinopril, will continue Avapro 150 mg Qdaily.   3. Tobacco abuse: Complete cessation advised. He is still smoking several cigarettes per day. He wishes to stop. 10 minutes of counseling given today.   4. HTN: BP well controlled. No changes  5. PAD: Followed in PV clinic by Dr. Kirke Corin. Will continue ASA and Pletal.   Current medicines are reviewed at length with the patient today.  The patient does not have concerns regarding medicines.  The following changes have been  made:  no change  Labs/ tests ordered today include:  No orders of the defined types were placed in this encounter.    Disposition:   FU with me in 6 months  Signed, Verne Carrow, MD 10/02/2015 9:37 AM    [facility] [address] [place], [place], [place]  [address] Phone: [phone]; Fax: [phone]

## 2015-10-02 ENCOUNTER — Ambulatory Visit (INDEPENDENT_AMBULATORY_CARE_PROVIDER_SITE_OTHER): Payer: 59

## 2015-10-02 ENCOUNTER — Ambulatory Visit (INDEPENDENT_AMBULATORY_CARE_PROVIDER_SITE_OTHER): Payer: 59 | Admitting: Cardiovascular Disease

## 2015-10-02 ENCOUNTER — Encounter: Payer: Self-pay | Admitting: Cardiovascular Disease

## 2015-10-02 VITALS — BP 125/72 | HR 70 | Ht 72.0 in | Wt 180.0 lb

## 2015-10-02 DIAGNOSIS — I2510 Atherosclerotic heart disease of native coronary artery without angina pectoris: Secondary | ICD-10-CM

## 2015-10-02 DIAGNOSIS — Z23 Encounter for immunization: Secondary | ICD-10-CM

## 2015-10-02 DIAGNOSIS — I10 Essential (primary) hypertension: Secondary | ICD-10-CM

## 2015-10-02 DIAGNOSIS — I255 Ischemic cardiomyopathy: Secondary | ICD-10-CM

## 2015-10-02 DIAGNOSIS — I739 Peripheral vascular disease, unspecified: Secondary | ICD-10-CM

## 2015-10-02 DIAGNOSIS — Z720 Tobacco use: Secondary | ICD-10-CM | POA: Diagnosis not present

## 2015-10-02 MED ORDER — CILOSTAZOL 100 MG PO TABS: 100.0000 mg | ORAL_TABLET | Freq: Two times a day (BID) | ORAL | Status: DC

## 2015-10-02 NOTE — Patient Instructions (Signed)
Medication Instructions:  Your physician recommends that you continue on your current medications as directed. Please refer to the Current Medication list given to you today.   Labwork: none  Testing/Procedures: none  Follow-Up: Your physician wants you to follow-up in: 6 months.  You will receive a reminder letter in the mail two months in advance. If you don't receive a letter, please call our office to schedule the follow-up appointment.   Any Other Special Instructions Will Be Listed Below (If Applicable).   

## 2015-10-03 ENCOUNTER — Other Ambulatory Visit: Payer: Self-pay

## 2015-10-03 ENCOUNTER — Encounter (HOSPITAL_COMMUNITY): Payer: Self-pay | Admitting: *Deleted

## 2015-10-09 ENCOUNTER — Encounter: Payer: Self-pay | Admitting: Cardiovascular Disease

## 2015-10-09 ENCOUNTER — Ambulatory Visit (INDEPENDENT_AMBULATORY_CARE_PROVIDER_SITE_OTHER): Payer: 59 | Admitting: Cardiovascular Disease

## 2015-10-09 VITALS — BP 146/80 | HR 64 | Ht 73.0 in | Wt 185.2 lb

## 2015-10-09 DIAGNOSIS — I2510 Atherosclerotic heart disease of native coronary artery without angina pectoris: Secondary | ICD-10-CM | POA: Diagnosis not present

## 2015-10-09 MED ORDER — CILOSTAZOL 100 MG PO TABS: 100.0000 mg | ORAL_TABLET | Freq: Two times a day (BID) | ORAL | Status: DC

## 2015-10-09 MED ORDER — NITROGLYCERIN 0.4 MG SL SUBL: 0.4000 mg | SUBLINGUAL_TABLET | SUBLINGUAL | Status: DC | PRN

## 2015-10-09 NOTE — Patient Instructions (Signed)
Medication Instructions:  Your physician has recommended you make the following change in your medication:  1. START Pletal 100mg  take one tablet by mouth twice a day  Labwork: No new orders.   Testing/Procedures: No new orders.   Follow-Up: Your physician wants you to follow-up in: 6 MONTHS with Dr Kirke CorinArida.  You will receive a reminder letter in the mail two months in advance. If you don't receive a letter, please call our office to schedule the follow-up appointment.   Any Other Special Instructions Will Be Listed Below (If Applicable).

## 2015-10-09 NOTE — Progress Notes (Signed)
Primary cardiologist: Dr. Clifton James  History of Present Illness: This is a very pleasant 64 year old man who is here today for a followup visit regarding peripheral arterial disease. He has known history of tobacco abuse, HTN and CAD . He was admitted to [facility] 10/29/13 with anterior STEMI and found to have moderately severe left main and LAD stenosis, moderately severe RCA stenosis. He underwent 4V CABG on 11/04/13 per Dr. Dorris Fetch (LIMA to mid LAD, SVG to OM, SVG to PDA, SVG to Diagonal). LVEF=40-45% prior to bypass.   He was seen in April,2015 for bilateral calf claudication worse on the left side.  Noninvasive evaluation showed an ABI of 0.78 on the right and 0.41 on the left with evidence of left distal SFA occlusion and significant disease in the right SFA. I proceeded with lower extremity angiography in 05/2014 which showed: 1. No significant aortoiliac disease.  2. Occluded left distal SFA into the proximal popliteal artery with 2 vessel runoff below the knee.  3. Possible distal right SFA stenosis with 1 vessel runoff below the knee.   I performed successful angioplasty and self-expanding stent placement to the left SFA with 2 Supera stents. He had recurrent left calf claudication and repeat angiography showed occluded mid to distal left SFA within the stent with reasonable collaterals.  He has been treated medically with cilostazol with resolution of claudication. He went on a [place] cruise recently and was able to walk throughout the trip without limitations. He continues to smoke 4 cigarettes a day. He continues to be irritable.  Primary Care Physician: Dr. Earlene Plater ([facility])  Last Lipid Profile:Lipid Panel     Component Value Date/Time   CHOL 117 01/02/2015 0836   TRIG 43.0 01/02/2015 0836   HDL 44.70 01/02/2015 0836   CHOLHDL 3 01/02/2015 0836   VLDL 8.6 01/02/2015 0836   LDLCALC 64 01/02/2015 0836     Past Medical History  Diagnosis  Date  . Hypertension   . Tobacco abuse   . Ischemic cardiomyopathy   . CAD (coronary artery disease)   . MI (myocardial infarction) (HCC)   . PVD (peripheral vascular disease) (HCC)     thighs  . Allergy   . Prediabetes 09/01/2014    Past Surgical History  Procedure Laterality Date  . Toe surgery    . Appendectomy    . Knee surgery      fractured patella  . Mouth surgery    . Left heart cath N/A 10/29/2013    Procedure: LEFT HEART CATH;  Surgeon: Kathleene Hazel, MD;  Location: [facility] CATH LAB;  Service: Cardiovascular;  Laterality: N/A;  . Left heart catheterization with coronary angiogram N/A 10/31/2013    Procedure: LEFT HEART CATHETERIZATION WITH CORONARY ANGIOGRAM;  Surgeon: Kathleene Hazel, MD;  Location: [facility] CATH LAB;  Service: Cardiovascular;  Laterality: N/A;  . Abdominal aortagram N/A 06/07/2014    Procedure: ABDOMINAL Ronny Flurry;  Surgeon: Iran Ouch, MD;  Location: [facility] CATH LAB;  Service: Cardiovascular;  Laterality: N/A;  . Abdominal aortagram N/A 03/07/2015    Procedure: ABDOMINAL Ronny Flurry;  Surgeon: Iran Ouch, MD;  Location: [facility] CATH LAB;  Service: Cardiovascular;  Laterality: N/A;  . Coronary artery bypass graft N/A 11/04/2013    Procedure: CORONARY ARTERY BYPASS GRAFTING (CABG) TIMES FOUR  USING LEFT INTERNAL MAMMARY ARTERY AND RIGHT AND LEFT SAPHENOUS LEG VEIN HARVESTED ENDOSCOPICALLY;  Surgeon: Loreli Slot, MD;  Location: [facility] OR;  Service: Open Heart Surgery;  Laterality: N/A;  Current Outpatient Prescriptions  Medication Sig Dispense Refill  . aspirin EC 81 MG tablet Take 1 tablet (81 mg total) by mouth daily. 90 tablet 3  . atorvastatin (LIPITOR) 80 MG tablet TAKE 1 TABLET BY MOUTH  DAILY AT 6PM 90 tablet 1  . buPROPion (WELLBUTRIN) 100 MG tablet Take 1 tablet (100 mg total) by mouth 2 (two) times daily. 90 tablet 2  . cilostazol (PLETAL) 100 MG tablet Take 1 tablet (100 mg total) by mouth 2 (two) times daily. 180 tablet 3  .  cilostazol (PLETAL) 50 MG tablet Take 50 mg by mouth 2 (two) times daily.     . irbesartan (AVAPRO) 150 MG tablet Take 1 tablet by mouth  daily 90 tablet 0  . metoprolol tartrate (LOPRESSOR) 25 MG tablet Take 1 tablet by mouth two  times daily 180 tablet 1  . nitroGLYCERIN (NITROSTAT) 0.4 MG SL tablet Place 1 tablet (0.4 mg total) under the tongue every 5 (five) minutes as needed for chest pain. 25 tablet 6  . OVER THE COUNTER MEDICATION Take 2 tablets by mouth daily. Arte-clear circulation supplement     No current facility-administered medications for this visit.    Allergies  Allergen Reactions  . Ace Inhibitors Swelling    Angioedema  . Dairy Aid [Lactase]     gas  . Eggs Or Egg-Derived Products     Cannot eat Prepared Eggs  . Peanut-Containing Drug Products Nausea And Vomiting    Does not know which nuts, but states nuts make him vomit    Social History   Social History  . Marital Status: Married    Spouse Name: RangeleyVenice  . Number of Children: 1  . Years of Education: N/A   Occupational History  . 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 Smoking status: Current Every Day Smoker -- 0.25 packs/day for 30 years    Types: Cigarettes  . Smokeless tobacco: Never Used     Comment: < 1/2 ppd  . Alcohol Use: No  . Drug Use: No  . Sexual Activity: Yes   Other Topics Concern  . Not on file   Social History Narrative   Household-- pt and wife   1 daughter     Family History  Problem Relation Age of Onset  . CAD Father 6669  . AAA (abdominal aortic aneurysm) Father   . Alcohol abuse Father   . Hypertension Father   . Diabetes Father   . Cardiomyopathy Daughter   . Stroke Mother   . Arthritis Mother   . Heart disease Mother   . Hypertension Mother   . Colon cancer Neg Hx   . Prostate cancer Neg Hx   . Heart attack Mother   . Heart attack Father     Review of Systems:  As stated in the HPI and otherwise negative.   There were  no vitals taken for this visit.  Physical Examination: General: Well developed, well nourished, NAD HEENT: OP clear, mucus membranes moist SKIN: warm, dry. No rashes. Neuro: No focal deficits Musculoskeletal: Muscle strength 5/5 all ext Psychiatric: Mood and affect normal Neck: No JVD, no carotid bruits, no thyromegaly, no lymphadenopathy. Lungs:Clear bilaterally, no wheezes, rhonci, crackles Cardiovascular: Regular rate and rhythm. No murmurs, gallops or rubs. Abdomen:Soft. Bowel sounds present. Non-tender.  Extremities: No lower extremity edema.  vascular: Femoral pulses are normal bilaterally. Posterior tibial: +2 on the right and absent on the left side. Dorsalis pedis: Normal  on the right side and absent on the left side.    Assessment and Plan:    1. peripheral arterial disease :  Known occluded distal left SFA within the previously placed stents. Claudication is minimal at the present time with cilostazol. Thus, I recommend continuing medical therapy. I strongly advised him to quit smoking completely.  2. CAD: Stable s/p CABG November 2014. . Doing well. Continue ASA, statin, beta blocker.   3. Depression and anxiety :  he continues to be irritable and argues frequently with his wife. He might benefit from treatment with an SSRI.

## 2015-10-09 NOTE — Progress Notes (Signed)
10-09-15 1205 Questions on AM meds prior to procedure with review to take Metoprolol, Bupropion. Atorvastatin take usual 6 pm time.

## 2015-10-10 NOTE — Anesthesia Preprocedure Evaluation (Addendum)
Anesthesia Evaluation  Patient identified by MRN, date of birth, ID band Patient awake    Reviewed: Allergy & Precautions, H&P , NPO status , Patient's Chart, lab work & pertinent test results, reviewed documented beta blocker date and time   History of Anesthesia Complications Negative for: history of anesthetic complications  Airway Mallampati: I  TM Distance: >3 FB Neck ROM: full    Dental  (+) Teeth Intact, Dental Advisory Given, Partial Lower   Pulmonary Current Smoker,    Pulmonary exam normal breath sounds clear to auscultation       Cardiovascular hypertension, Pt. on medications and Pt. on home beta blockers + CAD (60% L main), + Past MI and + CABG  Normal cardiovascular exam Rhythm:Irregular Rate:Bradycardia  11/14 ECHO: EF 40-45%, severe antero-lateral and antero-septal hypokinesis   Neuro/Psych negative neurological ROS  negative psych ROS   GI/Hepatic negative GI ROS, Neg liver ROS,   Endo/Other  negative endocrine ROSprediabetes  Renal/GU negative Renal ROS  negative genitourinary   Musculoskeletal   Abdominal Normal abdominal exam  (+)   Peds  Hematology negative hematology ROS (+)   Anesthesia Other Findings   Reproductive/Obstetrics negative OB ROS                           Anesthesia Physical Anesthesia Plan  ASA: III  Anesthesia Plan: MAC   Post-op Pain Management:    Induction:   Airway Management Planned:   Additional Equipment:   Intra-op Plan:   Post-operative Plan:   Informed Consent: I have reviewed the patients History and Physical, chart, labs and discussed the procedure including the risks, benefits and alternatives for the proposed anesthesia with the patient or authorized representative who has indicated his/her understanding and acceptance.   Dental Advisory Given  Plan Discussed with: CRNA and Surgeon  Anesthesia Plan Comments:          Anesthesia Quick Evaluation

## 2015-10-11 ENCOUNTER — Ambulatory Visit (HOSPITAL_COMMUNITY): Payer: 59 | Admitting: Anesthesiology

## 2015-10-11 ENCOUNTER — Encounter (HOSPITAL_COMMUNITY): Payer: Self-pay

## 2015-10-11 ENCOUNTER — Ambulatory Visit (HOSPITAL_COMMUNITY)
Admission: RE | Admit: 2015-10-11 | Discharge: 2015-10-11 | Disposition: A | Discharge status: Home / Self Care | Payer: 59 | Source: Ambulatory Visit | Attending: Gastroenterology | Admitting: Gastroenterology

## 2015-10-11 ENCOUNTER — Encounter (HOSPITAL_COMMUNITY)
Admission: RE | Disposition: A | Discharge status: Home / Self Care | Payer: Self-pay | Source: Ambulatory Visit | Attending: Gastroenterology

## 2015-10-11 DIAGNOSIS — I255 Ischemic cardiomyopathy: Secondary | ICD-10-CM | POA: Diagnosis not present

## 2015-10-11 DIAGNOSIS — Z7982 Long term (current) use of aspirin: Secondary | ICD-10-CM | POA: Insufficient documentation

## 2015-10-11 DIAGNOSIS — Z951 Presence of aortocoronary bypass graft: Secondary | ICD-10-CM | POA: Insufficient documentation

## 2015-10-11 DIAGNOSIS — I739 Peripheral vascular disease, unspecified: Secondary | ICD-10-CM | POA: Insufficient documentation

## 2015-10-11 DIAGNOSIS — K648 Other hemorrhoids: Secondary | ICD-10-CM | POA: Diagnosis not present

## 2015-10-11 DIAGNOSIS — I252 Old myocardial infarction: Secondary | ICD-10-CM | POA: Diagnosis not present

## 2015-10-11 DIAGNOSIS — K5730 Diverticulosis of large intestine without perforation or abscess without bleeding: Secondary | ICD-10-CM | POA: Diagnosis not present

## 2015-10-11 DIAGNOSIS — I10 Essential (primary) hypertension: Secondary | ICD-10-CM | POA: Diagnosis not present

## 2015-10-11 DIAGNOSIS — Z1211 Encounter for screening for malignant neoplasm of colon: Secondary | ICD-10-CM | POA: Insufficient documentation

## 2015-10-11 DIAGNOSIS — F17210 Nicotine dependence, cigarettes, uncomplicated: Secondary | ICD-10-CM | POA: Diagnosis not present

## 2015-10-11 DIAGNOSIS — I2510 Atherosclerotic heart disease of native coronary artery without angina pectoris: Secondary | ICD-10-CM | POA: Insufficient documentation

## 2015-10-11 DIAGNOSIS — Z79899 Other long term (current) drug therapy: Secondary | ICD-10-CM | POA: Diagnosis not present

## 2015-10-11 HISTORY — PX: COLONOSCOPY WITH PROPOFOL: SHX5780

## 2015-10-11 SURGERY — COLONOSCOPY WITH PROPOFOL
Anesthesia: Monitor Anesthesia Care

## 2015-10-11 MED ORDER — LACTATED RINGERS IV SOLN
INTRAVENOUS | Status: DC
  Administered 2015-10-11: 1000 mL via INTRAVENOUS

## 2015-10-11 MED ORDER — SODIUM CHLORIDE 0.9 % IV SOLN: INTRAVENOUS | Status: DC

## 2015-10-11 MED ORDER — LIDOCAINE HCL (PF) 2 % IJ SOLN
INTRAMUSCULAR | Status: DC | PRN
  Administered 2015-10-11: 20 mg via INTRADERMAL

## 2015-10-11 MED ORDER — LIDOCAINE HCL (CARDIAC) 20 MG/ML IV SOLN
INTRAVENOUS | Status: AC
  Filled 2015-10-11: qty 5

## 2015-10-11 MED ORDER — PROPOFOL 10 MG/ML IV BOLUS
INTRAVENOUS | Status: DC | PRN
  Administered 2015-10-11: 100 mg via INTRAVENOUS
  Administered 2015-10-11: 50 mg via INTRAVENOUS

## 2015-10-11 MED ORDER — FENTANYL CITRATE (PF) 100 MCG/2ML IJ SOLN: 25.0000 ug | INTRAMUSCULAR | Status: DC | PRN

## 2015-10-11 MED ORDER — PROPOFOL 10 MG/ML IV BOLUS
INTRAVENOUS | Status: AC
  Filled 2015-10-11: qty 20

## 2015-10-11 SURGICAL SUPPLY — 22 items

## 2015-10-11 NOTE — Op Note (Signed)
[facility] [address] [place], [id number]   OPERATIVE PROCEDURE REPORT  PATIENT: Lonnie [name], Lonnie [name]  MR#: [id number] BIRTHDATE: [birth year] GENDER: male ENDOSCOPIST: Lorenza BurtonJyothi N Karene Bracken, MD ASSISTANT:   Kandice RobinsonsGuillaume Awaka, technician & Alanda Amassrystal Wilson, RN. PROCEDURE DATE: 10/11/2015 PRE-PROCEDURE PREPARATION: Patient fasted for 4 hours prior to procedure. The patient was prepped with a gallon of Golytely the night prior to the procedure. PRE-PROCEDURE PHYSICAL: Patient has stable vital signs except for bradycardia.  Neck is supple.  There is no JVD, thyromegaly or LAD. Chest clear to auscultation.  S1 and S2 regular.  Abdomen soft, non-distended, non-tender with NABS. PROCEDURE:     Colonoscopy, diagnostic ASA CLASS:     Class III INDICATIONS:     1.  Colorectal cancer screening-average risk patient for colon cancer. MEDICATIONS:     Monitored anesthesia care.  DESCRIPTION OF PROCEDURE: After the risks, benefits, and alternatives of the procedure were thoroughly explained [including a 10% missed rate of cancer and polyps], informed consent was obtained. Digital rectal exam was performed. The Pentax adult Colonscope [id number]  was introduced through the anus  and advanced to the cecum, which was identified by both the appendix and ileocecal valve. No adverse events experienced.  The quality of the prep was good. Multiple washes were done. Small lesions could be missed. The instrument was then slowly withdrawn as the colon was fully examined. Estimated blood loss is zero unless otherwise noted in this procedure report.     COLON FINDINGS: There was moderate diverticulosis noted in the sigmoid colon. Moderate sized internal hemorrhoids were noted on retroflexion. The rest of the colonic mucosa appeared healthy with a normal vascular pattern.  No masses, polyps or AVMs were noted. The appendiceal orifice and the ICV were identified and photographed. The patient  tolerated the procedure without immediate complications. The scope was then withdrawn from the patient and the procedure terminated.    TIME TO CECUM:  4 minutes 00 seconds WITHDRAW TIME:  6 minutes 00 seconds  IMPRESSION:     1) Moderate diverticulosis was noted in the sigmoid colon. 2) Moderate sized internal hemorrhoids.  RECOMMENDATIONS:     1.  Continue current medications. 2.  Continue surveillance. 3.  High fiber diet with liberal fluid intake. 4.  OP follow-up is advised on a PRN basis.  REPEAT EXAM:      In 10 years  for a repeat colonoscopy.  If the patient has any abnormal GI symptoms in the interim, he have been advised to contact the office as soon as possible for further recommendations.   REFERRED VH:QIONBY:Jose Paz, M.D. eSigned:  Lorenza BurtonJyothi N Peggy Monk, MD 10/11/2015 7:56 AM  CPT CODES:     [phone] Colonoscopy, flexible, proximal to splenic flexure; diagnostic, with or without collection of specimen(s) by brushing or washing, with or without colon decompression (separate procedure) ICD CODES:     Z12.11 Encounter for screening for malignant neoplasm of colon, K57.30 Diverticulosis.  The ICD and CPT codes recommended by this software are interpretations from the data that the clinical staff has captured with the software.  The verification of the translation of this report to the ICD and CPT codes and modifiers is the sole responsibility of the health care institution and practicing physician where this report was generated.  [employer] will not be held responsible for the validity of the ICD and CPT codes included on this report.  AMA assumes no liability for data contained or not contained herein. CPT  is a Publishing rights manager of the [employer].  PATIENT NAME:  Lonnie [name], Lonnie [name] MR#: [id number]

## 2015-10-11 NOTE — H&P (Signed)
Lonnie MareLarry [name] is an 64 y.o. male.    Chief Complaint:  Colorectal cancer screening.  HOPI: Patient is here for a screening colonoscopy. See office notes for details. He has an EF OF 40-45%. He denies having any GI problems at this time.  Past Medical History  Diagnosis Date  . Hypertension   . Tobacco abuse   . Ischemic cardiomyopathy   . CAD (coronary artery disease)   . MI (myocardial infarction) (HCC)   . PVD (peripheral vascular disease) (HCC)     thighs  . Allergy   . Prediabetes 09/01/2014   Past Surgical History  Procedure Laterality Date  . Toe surgery    . Appendectomy    . Knee surgery      fractured patella  . Mouth surgery    . Left heart cath N/A 10/29/2013    Procedure: LEFT HEART CATH;  Surgeon: Kathleene Hazelhristopher D McAlhany, MD;  [facility];  Service: Cardiovascular;  Laterality: N/A;  . Left heart catheterization with coronary angiogram N/A 10/31/2013    Procedure: LEFT HEART CATHETERIZATION WITH CORONARY ANGIOGRAM;  Surgeon: Kathleene Hazelhristopher D McAlhany, MD;  [facility];  Service: Cardiovascular;  Laterality: N/A;  . Abdominal aortagram N/A 06/07/2014    Procedure: ABDOMINAL Ronny FlurryAORTAGRAM;  Surgeon: Iran OuchMuhammad A Arida, MD;  Location: [facility] CATH LAB;  Service: Cardiovascular;  Laterality: N/A;  . Abdominal aortagram N/A 03/07/2015    Procedure: ABDOMINAL Ronny FlurryAORTAGRAM;  Surgeon: Iran OuchMuhammad A Arida, MD;  Location: [facility] CATH LAB;  Service: Cardiovascular;  Laterality: N/A;  . Coronary artery bypass graft N/A 11/04/2013    Procedure: CORONARY ARTERY BYPASS GRAFTING (CABG) TIMES FOUR  USING LEFT INTERNAL MAMMARY ARTERY AND RIGHT AND LEFT SAPHENOUS LEG VEIN HARVESTED ENDOSCOPICALLY;  Surgeon: Loreli SlotSteven C Hendrickson, MD;  [facility];  Service: Open Heart Surgery;  Laterality: N/A;   Family History  Problem Relation Age of Onset  . CAD Father 6769  . AAA (abdominal aortic aneurysm) Father   . Alcohol abuse Father   . Hypertension Father   . Diabetes Father   .  Cardiomyopathy Daughter   . Stroke Mother   . Arthritis Mother   . Heart disease Mother   . Hypertension Mother   . Colon cancer Neg Hx   . Prostate cancer Neg Hx   . Heart attack Mother   . Heart attack Father    Social History:  reports that he has been smoking Cigarettes.  He has a 7.5 pack-year smoking history. He has never used smokeless tobacco. He reports that he does not drink alcohol or use illicit drugs.  Allergies:  Allergies  Allergen Reactions  . Ace Inhibitors Swelling    Angioedema  . Dairy Aid [Lactase]     gas  . Eggs Or Egg-Derived Products     Cannot eat Prepared Eggs  . Peanut-Containing Drug Products Nausea And Vomiting    Does not know which nuts, but states nuts make him vomit   Medications Prior to Admission  Medication Sig Dispense Refill  . aspirin EC 81 MG tablet Take 1 tablet (81 mg total) by mouth daily. 90 tablet 3  . atorvastatin (LIPITOR) 80 MG tablet TAKE 1 TABLET BY MOUTH  DAILY AT 6PM 90 tablet 1  . buPROPion (WELLBUTRIN) 100 MG tablet Take 1 tablet (100 mg total) by mouth 2 (two) times daily. 90 tablet 2  . cilostazol (PLETAL) 100 MG tablet Take 1 tablet (100 mg total) by mouth 2 (two) times daily. 180 tablet  3  . irbesartan (AVAPRO) 150 MG tablet Take 1 tablet by mouth  daily 90 tablet 0  . metoprolol tartrate (LOPRESSOR) 25 MG tablet Take 1 tablet by mouth two  times daily 180 tablet 1  . nitroGLYCERIN (NITROSTAT) 0.4 MG SL tablet Place 1 tablet (0.4 mg total) under the tongue every 5 (five) minutes as needed for chest pain. 25 tablet 6  . OVER THE COUNTER MEDICATION Take 2 tablets by mouth daily. Arte-clear circulation supplement     Review of Systems  Constitutional: Negative.   HENT: Negative.   Eyes: Negative.   Respiratory: Negative.   Cardiovascular: Positive for chest pain.  Genitourinary: Negative.   Musculoskeletal: Negative.   Skin: Negative.   Neurological: Negative.   Endo/Heme/Allergies: Negative.    Psychiatric/Behavioral: Negative.    Blood pressure 152/91, pulse 65, temperature 97.7 F (36.5 C), temperature source Oral, resp. rate 13, height  (1.854 m), weight 83.915 kg (185 lb), SpO2 98 %. Physical Exam  Constitutional: He is oriented to person, place, and time. He appears well-developed and well-nourished.  HENT:  Head: Normocephalic and atraumatic.  Eyes: Conjunctivae and EOM are normal. Pupils are equal, round, and reactive to light.  Neck: Normal range of motion. Neck supple.  Cardiovascular: Normal rate and regular rhythm.   Respiratory: Effort normal and breath sounds normal.  GI: Soft. Bowel sounds are normal.  Musculoskeletal: Normal range of motion.  Neurological: He is alert and oriented to person, place, and time.  Skin: Skin is warm and dry.  Psychiatric: He has a normal mood and affect. His behavior is normal. Judgment and thought content normal.   Assessment/Plan Colorectal cancer screening: proceed with a colonoscopy at this time.   Patrizia Paule 10/11/2015, 7:11 AM

## 2015-10-11 NOTE — Discharge Instructions (Signed)
Colonoscopy, Care After °Refer to this sheet in the next few weeks. These instructions provide you with information on caring for yourself after your procedure. Your health care provider may also give you more specific instructions. Your treatment has been planned according to current medical practices, but problems sometimes occur. Call your health care provider if you have any problems or questions after your procedure. °WHAT TO EXPECT AFTER THE PROCEDURE  °After your procedure, it is typical to have the following: °· A small amount of blood in your stool. °· Moderate amounts of gas and mild abdominal cramping or bloating. °HOME CARE INSTRUCTIONS °· Do not drive, operate machinery, or sign important documents for 24 hours. °· You may shower and resume your regular physical activities, but move at a slower pace for the first 24 hours. °· Take frequent rest periods for the first 24 hours. °· Walk around or put a warm pack on your abdomen to help reduce abdominal cramping and bloating. °· Drink enough fluids to keep your urine clear or pale yellow. °· You may resume your normal diet as instructed by your health care provider. Avoid heavy or fried foods that are hard to digest. °· Avoid drinking alcohol for 24 hours or as instructed by your health care provider. °· Only take over-the-counter or prescription medicines as directed by your health care provider. °· If a tissue sample (biopsy) was taken during your procedure: °¨ Do not take aspirin or blood thinners for 7 days, or as instructed by your health care provider. °¨ Do not drink alcohol for 7 days, or as instructed by your health care provider. °¨ Eat soft foods for the first 24 hours. °SEEK MEDICAL CARE IF: °You have persistent spotting of blood in your stool 2-3 days after the procedure. °SEEK IMMEDIATE MEDICAL CARE IF: °· You have more than a small spotting of blood in your stool. °· You pass large blood clots in your stool. °· Your abdomen is swollen  (distended). °· You have nausea or vomiting. °· You have a fever. °· You have increasing abdominal pain that is not relieved with medicine. °  °This information is not intended to replace advice given to you by your health care provider. Make sure you discuss any questions you have with your health care provider. °  °Document Released: 07/22/2004 Document Revised: 09/28/2013 Document Reviewed: 08/15/2013 °Elsevier Interactive Patient Education ©2016 [employer] ° °

## 2015-10-11 NOTE — Transfer of Care (Signed)
Immediate Anesthesia Transfer of Care Note  Patient: Lonnie [name]  Procedure(s) Performed: Procedure(s): COLONOSCOPY WITH PROPOFOL (N/A)  Patient Location: PACU  Anesthesia Type:MAC  Level of Consciousness:  sedated, patient cooperative and responds to stimulation  Airway & Oxygen Therapy:Patient Spontanous Breathing   Post-op Assessment:  Report given to PACU RN and Post -op Vital signs reviewed and stable  Post vital signs:  Reviewed and stable  Last Vitals:  Filed Vitals:   10/11/15 0629  BP: 152/91  Pulse: 65  Temp: 36.5 C  Resp: 13    Complications: No apparent anesthesia complications

## 2015-10-11 NOTE — Anesthesia Postprocedure Evaluation (Signed)
  Anesthesia Post-op Note  Patient: Lonnie [name]  Procedure(s) Performed: Procedure(s) (LRB): COLONOSCOPY WITH PROPOFOL (N/A)  Patient Location: PACU  Anesthesia Type: MAC  Level of Consciousness: awake and alert   Airway and Oxygen Therapy: Patient Spontanous Breathing  Post-op Pain: mild  Post-op Assessment: Post-op Vital signs reviewed, Patient's Cardiovascular Status Stable, Respiratory Function Stable, Patent Airway and No signs of Nausea or vomiting  Last Vitals:  Filed Vitals:   10/11/15 0825  BP: 129/68  Pulse:   Temp:   Resp: 13    Post-op Vital Signs: stable   Complications: No apparent anesthesia complications

## 2015-10-12 ENCOUNTER — Encounter (HOSPITAL_COMMUNITY): Payer: Self-pay | Admitting: Gastroenterology

## 2015-10-13 ENCOUNTER — Other Ambulatory Visit: Payer: Self-pay | Admitting: Cardiovascular Disease

## 2015-12-09 ENCOUNTER — Other Ambulatory Visit: Payer: Self-pay | Admitting: Internal Medicine

## 2015-12-23 HISTORY — PX: FINGER SURGERY: SHX640

## 2016-01-08 ENCOUNTER — Telehealth: Payer: Self-pay | Admitting: Behavioral Health

## 2016-01-08 ENCOUNTER — Encounter: Payer: Self-pay | Admitting: Behavioral Health

## 2016-01-08 NOTE — Telephone Encounter (Signed)
Pre-Visit Call completed with patient and chart updated.   Pre-Visit Info documented in Specialty Comments under [employer].    

## 2016-01-09 ENCOUNTER — Ambulatory Visit (INDEPENDENT_AMBULATORY_CARE_PROVIDER_SITE_OTHER): Payer: 59 | Admitting: Internal Medicine

## 2016-01-09 ENCOUNTER — Encounter: Payer: Self-pay | Admitting: Internal Medicine

## 2016-01-09 VITALS — BP 122/74 | HR 49 | Temp 98.0°F | Ht 73.0 in | Wt 184.0 lb

## 2016-01-09 DIAGNOSIS — Z0000 Encounter for general adult medical examination without abnormal findings: Secondary | ICD-10-CM

## 2016-01-09 DIAGNOSIS — R7303 Prediabetes: Secondary | ICD-10-CM | POA: Diagnosis not present

## 2016-01-09 DIAGNOSIS — Z114 Encounter for screening for human immunodeficiency virus [HIV]: Secondary | ICD-10-CM | POA: Diagnosis not present

## 2016-01-09 LAB — HIV ANTIBODY (ROUTINE TESTING W REFLEX): HIV 1&2 Ab, 4th Generation: NONREACTIVE

## 2016-01-09 LAB — BASIC METABOLIC PANEL
BUN: 20 mg/dL (ref 6–23)
CO2: 25 mEq/L (ref 19–32)
Calcium: 9 mg/dL (ref 8.4–10.5)
Chloride: 109 mEq/L (ref 96–112)
Creatinine, Ser: 1.18 mg/dL (ref 0.40–1.50)
GFR: 80.29 mL/min (ref >60.00)
Glucose, Bld: 99 mg/dL (ref 70–99)
Potassium: 4.9 mEq/L (ref 3.5–5.1)
Sodium: 142 mEq/L (ref 135–145)

## 2016-01-09 LAB — LIPID PANEL
Cholesterol: 104 mg/dL (ref 0–200)
HDL: 45.8 mg/dL (ref >39.00)
LDL Cholesterol: 50 mg/dL (ref 0–99)
NonHDL: 58.02
Total CHOL/HDL Ratio: 2
Triglycerides: 39 mg/dL (ref 0.0–149.0)
VLDL: 7.8 mg/dL (ref 0.0–40.0)

## 2016-01-09 LAB — ALT: ALT: 19 U/L (ref 0–53)

## 2016-01-09 LAB — HEMOGLOBIN A1C: Hgb A1c MFr Bld: 6.2 % (ref 4.6–6.5)

## 2016-01-09 LAB — AST: AST: 22 U/L (ref 0–37)

## 2016-01-09 LAB — TSH: TSH: 1.97 u[IU]/mL (ref 0.35–4.50)

## 2016-01-09 NOTE — Progress Notes (Signed)
Pre visit review using our clinic review tool, if applicable. No additional management support is needed unless otherwise documented below in the visit note. 

## 2016-01-09 NOTE — Patient Instructions (Signed)
BEFORE YOU LEAVE THE OFFICE: GO TO THE LAB  Get the blood work    GO TO THE FRONT DESK Schedule a routine office visit or check up to be done in 6 months , no fasting   Front desk:  15        

## 2016-01-09 NOTE — Progress Notes (Signed)
Subjective:    Patient ID: Lonnie [name], male    DOB: 1951-03-04, 65 y.o.   MRN: [id number]  DOS:  01/09/2016 Type of visit - description : CPX Interval history: Good compliance of medication, no apparent side effects     Review of Systems Constitutional: No fever. No chills. No unexplained wt changes. No unusual sweats  HEENT: No dental problems, no ear discharge, no facial swelling, no voice changes. No eye discharge, no eye  redness , no  intolerance to light   Respiratory: No wheezing , no  difficulty breathing. No cough , no mucus production  Cardiovascular: No CP, no leg swelling , no  Palpitations  GI: no nausea, no vomiting, no diarrhea , no  abdominal pain.  No blood in the stools. No dysphagia, no odynophagia    Endocrine: No polyphagia, no polyuria , no polydipsia  GU: No dysuria, gross hematuria, difficulty urinating. No urinary urgency, no frequency.  Musculoskeletal: No joint swellings or unusual aches or pains  Skin: No change in the color of the skin, palor , no  Rash  Allergic, immunologic: No environmental allergies , no  food allergies  Neurological: No dizziness no  syncope. No headaches. No diplopia, no slurred, no slurred speech, no motor deficits, no facial  Numbness  Hematological: No enlarged lymph nodes, no easy bruising , no unusual bleedings  Psychiatry: No suicidal ideas, no hallucinations, no beavior problems, no confusion.  + stress and some anxiety related to current political environment in the country  Past Medical History  Diagnosis Date  . Hypertension   . Tobacco abuse   . Ischemic cardiomyopathy   . CAD (coronary artery disease)   . MI (myocardial infarction) (HCC)   . PVD (peripheral vascular disease) (HCC)     thighs  . Allergy   . Prediabetes 09/01/2014    Past Surgical History  Procedure Laterality Date  . Toe surgery    . Appendectomy    . Knee surgery      fractured patella  . Mouth surgery    . Left heart  cath N/A 10/29/2013    Procedure: LEFT HEART CATH;  Surgeon: Kathleene Hazel, MD;  Location: [facility] CATH LAB;  Service: Cardiovascular;  Laterality: N/A;  . Left heart catheterization with coronary angiogram N/A 10/31/2013    Procedure: LEFT HEART CATHETERIZATION WITH CORONARY ANGIOGRAM;  Surgeon: Kathleene Hazel, MD;  Location[facility] CATH LAB;  Service: Cardiovascular;  Laterality: N/A;  . Abdominal aortagram N/A 06/07/2014    Procedure: ABDOMINAL Ronny Flurry;  Surgeon: Iran Ouch, MD;  Location: [facility] CATH LAB;  Service: Cardiovascular;  Laterality: N/A;  . Abdominal aortagram N/A 03/07/2015    Procedure: ABDOMINAL Ronny Flurry;  Surgeon: Iran Ouch, MD;  Location: [facility] CATH LAB;  Service: Cardiovascular;  Laterality: N/A;  . Coronary artery bypass graft N/A 11/04/2013    Procedure: CORONARY ARTERY BYPASS GRAFTING (CABG) TIMES FOUR  USING LEFT INTERNAL MAMMARY ARTERY AND RIGHT AND LEFT SAPHENOUS LEG VEIN HARVESTED ENDOSCOPICALLY;  Surgeon: Loreli Slot, MD;  Location: [facility] OR;  Service: Open Heart Surgery;  Laterality: N/A;  . Colonoscopy with propofol N/A 10/11/2015    Procedure: COLONOSCOPY WITH PROPOFOL;  Surgeon: Charna Elizabeth, MD;  Location: [facility] ENDOSCOPY;  Service: Endoscopy;  Laterality: N/A;    Social History   Social History  . Marital Status: Married    Spouse Name: So-Hi  . Number of Children: 1  . Years of Education: N/A   Occupational History  .  long term disability --Photographer   Social History Main Topics  . Smoking status: Current Every Day Smoker -- 0.25 packs/day for 30 years    Types: Cigarettes  . Smokeless tobacco: Never Used     Comment: < 1/2 ppd  . Alcohol Use: No  . Drug Use: No  . Sexual Activity: Yes   Other Topics Concern  . Not on file   Social History Narrative   Household-- pt and wife   1 daughter      Family History  Problem Relation Age of Onset  . CAD Father 80  . AAA (abdominal aortic aneurysm) Father   . Alcohol  abuse Father   . Hypertension Father   . Diabetes Father   . Cardiomyopathy Daughter   . Stroke Mother   . Arthritis Mother   . Heart disease Mother   . Hypertension Mother   . Colon cancer Neg Hx   . Prostate cancer Neg Hx   . Heart attack Mother   . Heart attack Father        Medication List       This list is accurate as of: 01/09/16 11:59 PM.  Always use your most recent med list.               aspirin EC 81 MG tablet  Take 1 tablet (81 mg total) by mouth daily.     atorvastatin 80 MG tablet  Commonly known as:  LIPITOR  Take 1 tablet by mouth  daily at 6 PM     cilostazol 100 MG tablet  Commonly known as:  PLETAL  Take 1 tablet (100 mg total) by mouth 2 (two) times daily.     GINSENG PO  Take 1 tablet by mouth daily.     irbesartan 150 MG tablet  Commonly known as:  AVAPRO  Take 1 tablet by mouth  daily     metoprolol tartrate 25 MG tablet  Commonly known as:  LOPRESSOR  Take 1 tablet by mouth two  times daily     naproxen 500 MG tablet  Commonly known as:  NAPROSYN  Take 500 mg by mouth 2 (two) times daily with a meal. Reported on 01/09/2016     nitroGLYCERIN 0.4 MG SL tablet  Commonly known as:  NITROSTAT  Place 1 tablet (0.4 mg total) under the tongue every 5 (five) minutes as needed for chest pain.     terbinafine 250 MG tablet  Commonly known as:  LAMISIL  Take 250 mg by mouth daily.           Objective:   Physical Exam BP 122/74 mmHg  Pulse 49  Temp(Src) 98 F (36.7 C) (Oral)  Ht  (1.854 m)  Wt 184 lb (83.462 kg)  BMI 24.28 kg/m2  SpO2 98% General:   Well developed, well nourished . NAD.  Neck:  No  thyromegaly , normal carotid pulse HEENT:  Normocephalic . Face symmetric, atraumatic Lungs:  CTA B Normal respiratory effort, no intercostal retractions, no accessory muscle use. Heart: RRR,  no murmur.  No pretibial edema bilaterally  Abdomen:  Not distended, soft, non-tender. No rebound or rigidity  Skin: Exposed areas  without rash. Not pale. Not jaundice Neurologic:  alert & oriented X3.  Speech normal, gait appropriate for age and unassisted Strength symmetric and appropriate for age.  Psych: Cognition and judgment appear intact.  Cooperative with normal attention span and concentration.  Behavior appropriate. No anxious or depressed appearing.  Assessment & Plan:   Assessment Prediabetes  dx 08-2014 Feet care; saw podiatry 2016 for calluses  HTN CAD, MI , cath 10-2013 ---> CABG Ischemic cardiomyopathy Peripheral vascular disease Tobacco abuse -- intolerant to chantix before, tried Wellbutrin 2016 (not much help) Onychomycosis-- rx lamisil per podiatry 12-2015  Plan: Prediabetes: Check A1c HTN, CAD, PVD: Seems stable, continue with present care. Tobacco abuse: Counseled, currently on no medications, encouraged to possibly try again using nicotine supplements RTC 6 months

## 2016-01-09 NOTE — Assessment & Plan Note (Addendum)
Td 2015 Pneumonia shot 08-2014 ;prevnar 12-2014;  Declined a flu shot h/o a reaction  [facility]-- colonoscopy [id number] , no polyps, 10 years, Dr Loreta Ave DRE PSA  normal 12-2014  Diet-exercise- discussed  tobacco quitting discussed labs

## 2016-02-01 ENCOUNTER — Other Ambulatory Visit: Payer: Self-pay | Admitting: Cardiovascular Disease

## 2016-04-08 ENCOUNTER — Ambulatory Visit (INDEPENDENT_AMBULATORY_CARE_PROVIDER_SITE_OTHER): Payer: 59 | Admitting: Cardiovascular Disease

## 2016-04-08 ENCOUNTER — Encounter: Payer: Self-pay | Admitting: Cardiovascular Disease

## 2016-04-08 VITALS — BP 162/102 | HR 49 | Ht 72.0 in | Wt 179.4 lb

## 2016-04-08 DIAGNOSIS — I10 Essential (primary) hypertension: Secondary | ICD-10-CM | POA: Diagnosis not present

## 2016-04-08 DIAGNOSIS — I25810 Atherosclerosis of coronary artery bypass graft(s) without angina pectoris: Secondary | ICD-10-CM | POA: Diagnosis not present

## 2016-04-08 DIAGNOSIS — I739 Peripheral vascular disease, unspecified: Secondary | ICD-10-CM

## 2016-04-08 MED ORDER — IRBESARTAN 300 MG PO TABS: 300.0000 mg | ORAL_TABLET | Freq: Every day | ORAL | Status: DC

## 2016-04-08 NOTE — Patient Instructions (Signed)
Medication Instructions:  Your physician has recommended you make the following change in your medication:  1. INCREASE Avapro to 300mg  take one tablet by mouth daily  Labwork: No new orders.   Testing/Procedures: No new orders.   Follow-Up: Your physician wants you to follow-up in: 6 MONTHS with Dr Kirke CorinArida.  You will receive a reminder letter in the mail two months in advance. If you don't receive a letter, please call our office to schedule the follow-up appointment.   Any Other Special Instructions Will Be Listed Below (If Applicable).     If you need a refill on your cardiac medications before your next appointment, please call your pharmacy.

## 2016-04-08 NOTE — Progress Notes (Signed)
Primary Care Physician: Dr. Earlene Plater ([facility]) Primary cardiologist: Dr. Clifton James   History of Present Illness: This is a very pleasant 65 year old man who is here today for a followup visit regarding peripheral arterial disease. He has known history of tobacco abuse, HTN and CAD . He was admitted to [facility] 10/29/13 with anterior STEMI and found to have moderately severe left main and LAD stenosis, moderately severe RCA stenosis. He underwent 4V CABG on 11/04/13 per Dr. Dorris Fetch (LIMA to mid LAD, SVG to OM, SVG to PDA, SVG to Diagonal). LVEF=40-45% prior to bypass.    He has known history of prior intervention on the left SFA for total occlusion which subsequently reoccluded.He has been treated medically with cilostazol with resolution of claudication.  He continues to smoke 4 cigarettes a day. He continues to have significant problems with his marriage. He denies any chest pain or shortness of breath. He walks regularly with no symptoms.  Last Lipid Profile:Lipid Panel     Component Value Date/Time   CHOL 104 01/09/2016 0848   TRIG 39.0 01/09/2016 0848   HDL 45.80 01/09/2016 0848   CHOLHDL 2 01/09/2016 0848   VLDL 7.8 01/09/2016 0848   LDLCALC 50 01/09/2016 0848     Past Medical History  Diagnosis Date  . Hypertension   . Tobacco abuse   . Ischemic cardiomyopathy   . CAD (coronary artery disease)   . MI (myocardial infarction) (HCC)   . PVD (peripheral vascular disease) (HCC)     thighs  . Allergy   . Prediabetes 09/01/2014    Past Surgical History  Procedure Laterality Date  . Toe surgery    . Appendectomy    . Knee surgery      fractured patella  . Mouth surgery    . Left heart cath N/A 10/29/2013    Procedure: LEFT HEART CATH;  Surgeon: Kathleene Hazel, MD;  Location: [facility] CATH LAB;  Service: Cardiovascular;  Laterality: N/A;  . Left heart catheterization with coronary angiogram N/A 10/31/2013    Procedure: LEFT HEART CATHETERIZATION  WITH CORONARY ANGIOGRAM;  Surgeon: Kathleene Hazel, MD;  Location[facility] CATH LAB;  Service: Cardiovascular;  Laterality: N/A;  . Abdominal aortagram N/A 06/07/2014    Procedure: ABDOMINAL Ronny Flurry;  Surgeon: Iran Ouch, MD;  Location: [facility] CATH LAB;  Service: Cardiovascular;  Laterality: N/A;  . Abdominal aortagram N/A 03/07/2015    Procedure: ABDOMINAL Ronny Flurry;  Surgeon: Iran Ouch, MD;  Location: [facility] CATH LAB;  Service: Cardiovascular;  Laterality: N/A;  . Coronary artery bypass graft N/A 11/04/2013    Procedure: CORONARY ARTERY BYPASS GRAFTING (CABG) TIMES FOUR  USING LEFT INTERNAL MAMMARY ARTERY AND RIGHT AND LEFT SAPHENOUS LEG VEIN HARVESTED ENDOSCOPICALLY;  Surgeon: Loreli Slot, MD;  Location[facility] OR;  Service: Open Heart Surgery;  Laterality: N/A;  . Colonoscopy with propofol N/A 10/11/2015    Procedure: COLONOSCOPY WITH PROPOFOL;  Surgeon: Charna Elizabeth, MD;  Location: [facility] ENDOSCOPY;  Service: Endoscopy;  Laterality: N/A;    Current Outpatient Prescriptions  Medication Sig Dispense Refill  . aspirin EC 81 MG tablet Take 1 tablet (81 mg total) by mouth daily. 90 tablet 3  . atorvastatin (LIPITOR) 80 MG tablet Take 1 tablet by mouth  daily at 6 PM 90 tablet 1  . cilostazol (PLETAL) 100 MG tablet Take 1 tablet (100 mg total) by mouth 2 (two) times daily. 180 tablet 3  . GINSENG PO Take 1 tablet by mouth daily.    Marland Kitchen  irbesartan (AVAPRO) 300 MG tablet Take 1 tablet (300 mg total) by mouth daily. 90 tablet 3  . metoprolol tartrate (LOPRESSOR) 25 MG tablet Take 1 tablet by mouth two  times daily 180 tablet 1  . nitroGLYCERIN (NITROSTAT) 0.4 MG SL tablet Place 1 tablet (0.4 mg total) under the tongue every 5 (five) minutes as needed for chest pain. 25 tablet 6   No current facility-administered medications for this visit.    Allergies  Allergen Reactions  . Ace Inhibitors Swelling    Angioedema  . Dairy Aid [Lactase]     gas  . Eggs Or Egg-Derived Products     Cannot  eat Prepared Eggs  . Peanut-Containing Drug Products Nausea And Vomiting    Does not know which nuts, but states nuts make him vomit    Social History   Social History  . Marital Status: Married    Spouse Name: BethanyVenice  . Number of Children: 1  . Years of Education: N/A   Occupational History  . 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 Smoking status: Current Every Day Smoker -- 0.25 packs/day for 30 years    Types: Cigarettes  . Smokeless tobacco: Never Used     Comment: < 1/2 ppd  . Alcohol Use: No  . Drug Use: No  . Sexual Activity: Yes   Other Topics Concern  . Not on file   Social History Narrative   Household-- pt and wife   1 daughter     Family History  Problem Relation Age of Onset  . CAD Father 4969  . AAA (abdominal aortic aneurysm) Father   . Alcohol abuse Father   . Hypertension Father   . Diabetes Father   . Cardiomyopathy Daughter   . Stroke Mother   . Arthritis Mother   . Heart disease Mother   . Hypertension Mother   . Colon cancer Neg Hx   . Prostate cancer Neg Hx   . Heart attack Mother   . Heart attack Father     Review of Systems:  As stated in the HPI and otherwise negative.   BP 162/102 mmHg  Pulse 49  Ht 6' (1.829 m)  Wt 179 lb 6.4 oz (81.375 kg)  BMI 24.33 kg/m2  Physical Examination: General: Well developed, well nourished, NAD HEENT: OP clear, mucus membranes moist SKIN: warm, dry. No rashes. Neuro: No focal deficits Musculoskeletal: Muscle strength 5/5 all ext Psychiatric: Mood and affect normal Neck: No JVD, no carotid bruits, no thyromegaly, no lymphadenopathy. Lungs:Clear bilaterally, no wheezes, rhonci, crackles Cardiovascular: Regular rate and rhythm. No murmurs, gallops or rubs. Abdomen:Soft. Bowel sounds present. Non-tender.  Extremities: No lower extremity edema.  vascular: Femoral pulses are normal bilaterally. Posterior tibial: +2 on the right and absent on the left side.  Dorsalis pedis: Normal on the right side and absent on the left side.    Assessment and Plan:    1. peripheral arterial disease :  Known occluded distal left SFA within the previously placed stents. Claudication is minimal at the present time with cilostazol. Thus, I recommend continuing medical therapy. I strongly advised him to quit smoking completely.  2. CAD: Stable s/p CABG November 2014. . Doing well. Continue ASA, statin, beta blocker.     Disposition:follow-up in 6 months  Lorine BearsMuhammad Talor Cheema, MD

## 2016-04-14 ENCOUNTER — Telehealth: Payer: Self-pay | Admitting: Cardiovascular Disease

## 2016-04-14 NOTE — Telephone Encounter (Signed)
[employer]( [facility]) is calling about the Avapro 300mg  . They need to know if the Avapro has been increased from 150mg  to 300mg  . She is discontinuing the order. Please call and refer to the reference #[id number].

## 2016-04-14 NOTE — Telephone Encounter (Signed)
I spoke with [facility] and verified change in Avapro dosage. They will update the pt's medication profile.

## 2016-04-25 ENCOUNTER — Encounter: Payer: Self-pay | Admitting: Cardiovascular Disease

## 2016-04-25 ENCOUNTER — Ambulatory Visit (INDEPENDENT_AMBULATORY_CARE_PROVIDER_SITE_OTHER): Payer: 59 | Admitting: Cardiovascular Disease

## 2016-04-25 VITALS — BP 130/80 | HR 56 | Ht 72.0 in | Wt 179.8 lb

## 2016-04-25 DIAGNOSIS — I25810 Atherosclerosis of coronary artery bypass graft(s) without angina pectoris: Secondary | ICD-10-CM

## 2016-04-25 DIAGNOSIS — I739 Peripheral vascular disease, unspecified: Secondary | ICD-10-CM | POA: Diagnosis not present

## 2016-04-25 DIAGNOSIS — I10 Essential (primary) hypertension: Secondary | ICD-10-CM

## 2016-04-25 DIAGNOSIS — I255 Ischemic cardiomyopathy: Secondary | ICD-10-CM

## 2016-04-25 DIAGNOSIS — Z720 Tobacco use: Secondary | ICD-10-CM

## 2016-04-25 NOTE — Patient Instructions (Signed)

## 2016-04-25 NOTE — Progress Notes (Signed)
Chief Complaint  Patient presents with  . Follow-up  . PAD     History of Present Illness: 65 yo male with history of tobacco abuse, HTN and CAD who is here today for cardiac follow up. He was admitted to [facility] 10/29/13 with anterior STEMI and found to have severe left main and LAD stenosis, moderately severe RCA stenosis. He underwent 4V CABG on 11/04/13 per Dr. Dorris Fetch (LIMA to mid LAD, SVG to OM, SVG to PDA, SVG to Diagonal). LVEF=40-45% prior to bypass. Left SFA stents per Dr. Kirke Corin in 2015. Repeat LE angiography March 2016 showed occlusion of the left SFA stents. He was not felt to have limiting claudication so he has been managed conservatively with ASA and Pletal. Ace-inh induced angioedema on 06/13/14.   He is here today for follow up. No chest pain or SOB. No palpitations. Still smoking 4-5 cigarettes per day. No leg pain with ambulation. He is not exercising but he has been working in his yard spreading lime and seed.   Primary Care Physician: Willow Ora, MD   Past Medical History  Diagnosis Date  . Hypertension   . Tobacco abuse   . Ischemic cardiomyopathy   . CAD (coronary artery disease)   . MI (myocardial infarction) (HCC)   . PVD (peripheral vascular disease) (HCC)     thighs  . Allergy   . Prediabetes 09/01/2014    Past Surgical History  Procedure Laterality Date  . Toe surgery    . Appendectomy    . Knee surgery      fractured patella  . Mouth surgery    . Left heart cath N/A 10/29/2013    Procedure: LEFT HEART CATH;  Surgeon: Kathleene Hazel, MD;  Location: [facility] CATH LAB;  Service: Cardiovascular;  Laterality: N/A;  . Left heart catheterization with coronary angiogram N/A 10/31/2013    Procedure: LEFT HEART CATHETERIZATION WITH CORONARY ANGIOGRAM;  Surgeon: Kathleene Hazel, MD;  Location: [facility] CATH LAB;  Service: Cardiovascular;  Laterality: N/A;  . Abdominal aortagram N/A 06/07/2014    Procedure: ABDOMINAL Ronny Flurry;  Surgeon: Iran Ouch, MD;  Location: [facility] CATH LAB;  Service: Cardiovascular;  Laterality: N/A;  . Abdominal aortagram N/A 03/07/2015    Procedure: ABDOMINAL Ronny Flurry;  Surgeon: Iran Ouch, MD;  Location: [facility] CATH LAB;  Service: Cardiovascular;  Laterality: N/A;  . Coronary artery bypass graft N/A 11/04/2013    Procedure: CORONARY ARTERY BYPASS GRAFTING (CABG) TIMES FOUR  USING LEFT INTERNAL MAMMARY ARTERY AND RIGHT AND LEFT SAPHENOUS LEG VEIN HARVESTED ENDOSCOPICALLY;  Surgeon: Loreli Slot, MD;  Location: [facility] OR;  Service: Open Heart Surgery;  Laterality: N/A;  . Colonoscopy with propofol N/A 10/11/2015    Procedure: COLONOSCOPY WITH PROPOFOL;  Surgeon: Charna Elizabeth, MD;  Location: [facility] ENDOSCOPY;  Service: Endoscopy;  Laterality: N/A;    Current Outpatient Prescriptions  Medication Sig Dispense Refill  . aspirin EC 81 MG tablet Take 1 tablet (81 mg total) by mouth daily. 90 tablet 3  . atorvastatin (LIPITOR) 80 MG tablet Take 1 tablet by mouth  daily at 6 PM 90 tablet 1  . cilostazol (PLETAL) 100 MG tablet Take 1 tablet (100 mg total) by mouth 2 (two) times daily. 180 tablet 3  . GINSENG PO Take 1 tablet by mouth daily.    . irbesartan (AVAPRO) 300 MG tablet Take 1 tablet (300 mg total) by mouth daily. 90 tablet 3  . metoprolol tartrate (LOPRESSOR) 25 MG tablet Take 1 tablet  by mouth two  times daily 180 tablet 1  . nitroGLYCERIN (NITROSTAT) 0.4 MG SL tablet Place 1 tablet (0.4 mg total) under the tongue every 5 (five) minutes as needed for chest pain. 25 tablet 6   No current facility-administered medications for this visit.    Allergies  Allergen Reactions  . Ace Inhibitors Swelling    Angioedema  . Dairy Aid [Lactase]     gas  . Eggs Or Egg-Derived Products     Cannot eat Prepared Eggs  . Peanut-Containing Drug Products Nausea And Vomiting    Does not know which nuts, but states nuts make him vomit    Social History   Social History  . Marital Status: Married    Spouse Name:  [place]  . Number of Children: 1  . Years of Education: N/A   Occupational History  . long term disability --Photographer   Social History Main Topics  . Smoking status: Current Every Day Smoker -- 0.25 packs/day for 30 years    Types: Cigarettes  . Smokeless tobacco: Never Used     Comment: < 1/2 ppd  . Alcohol Use: No  . Drug Use: No  . Sexual Activity: Yes   Other Topics Concern  . Not on file   Social History Narrative   Household-- pt and wife   1 daughter     Family History  Problem Relation Age of Onset  . CAD Father 55  . AAA (abdominal aortic aneurysm) Father   . Alcohol abuse Father   . Hypertension Father   . Diabetes Father   . Cardiomyopathy Daughter   . Stroke Mother   . Arthritis Mother   . Heart disease Mother   . Hypertension Mother   . Colon cancer Neg Hx   . Prostate cancer Neg Hx   . Heart attack Mother   . Heart attack Father     Review of Systems:  As stated in the HPI and otherwise negative.   BP 130/80 mmHg  Pulse 56  Ht 6' (1.829 m)  Wt 179 lb 12.8 oz (81.557 kg)  BMI 24.38 kg/m2  SpO2 99%  Physical Examination: General: Well developed, well nourished, NAD HEENT: OP clear, mucus membranes moist SKIN: warm, dry. No rashes. Neuro: No focal deficits Musculoskeletal: Muscle strength 5/5 all ext Psychiatric: Mood and affect normal Neck: No JVD, no carotid bruits, no thyromegaly, no lymphadenopathy. Lungs:Clear bilaterally, no wheezes, rhonci, crackles Cardiovascular: Regular rate and rhythm. No murmurs, gallops or rubs. Abdomen:Soft. Bowel sounds present. Non-tender.  Extremities: No lower extremity edema. Pulses are 2 + in the right DP/PT and trace left DP/PT.   Echo 12/20/13: Left ventricle: Diffuse hypokinesis with abnormal septal motion EF somewhat hard to judge as patient had frequent bigemminy The cavity size was moderately dilated. Wall thickness was increased in a pattern of mild LVH. Systolic function was  moderately reduced. The estimated ejection fraction was in the range of 35% to 40%. - Left atrium: The atrium was mildly dilated. - Atrial septum: No defect or patent foramen ovale was identified. - Pulmonary arteries: PA peak pressure: 31mm Hg (S).  EKG:  EKG is not ordered today. The ekg ordered today demonstrates   Recent Labs: 01/09/2016: ALT 19; BUN 20; Creatinine, Ser 1.18; Potassium 4.9; Sodium 142; TSH 1.97   Lipid Panel    Component Value Date/Time   CHOL 104 01/09/2016 0848   TRIG 39.0 01/09/2016 0848   HDL 45.80 01/09/2016 0848   CHOLHDL 2  01/09/2016 0848   VLDL 7.8 01/09/2016 0848   LDLCALC 50 01/09/2016 0848     Wt Readings from Last 3 Encounters:  04/25/16 179 lb 12.8 oz (81.557 kg)  04/08/16 179 lb 6.4 oz (81.375 kg)  01/09/16 184 lb (83.462 kg)     Other studies Reviewed: Additional studies/ records that were reviewed today include: . Review of the above records demonstrates:    Assessment and Plan:   1. CAD: Stable s/p CABG November 2014 with no chest pain suggestive of angina. Will continue ASA, statin, beta blocker, ARB.   2. Ischemic Cardiomyopathy: Echo 12/20/13 which was 6 weeks post CABG showed LVEF=35-40%. Will repeat echo now at next visit. Will continue current meds. Since he had angioedema presumed to be due to Lisinopril, will continue Avapro 150 mg Qdaily.   3. Tobacco abuse: Complete cessation advised. He is still smoking several cigarettes per day. He wishes to stop.   4. HTN: BP well controlled. No changes  5. PAD: Followed in [facility] clinic by Dr. Kirke CorinArida. No claudication. Will continue ASA and Pletal.   Current medicines are reviewed at length with the patient today.  The patient does not have concerns regarding medicines.  The following changes have been made:  no change  Labs/ tests ordered today include:  No orders of the defined types were placed in this encounter.    Disposition:   FU with me in 12 months  Signed, Verne Carrowhristopher  McAlhany, MD 04/25/2016 1:54 PM    [facility] [address], [place], [place]  [address] Phone: [phone]; Fax: [phone]

## 2016-05-02 ENCOUNTER — Other Ambulatory Visit: Payer: Self-pay | Admitting: Cardiovascular Disease

## 2016-06-20 ENCOUNTER — Encounter (HOSPITAL_BASED_OUTPATIENT_CLINIC_OR_DEPARTMENT_OTHER): Payer: Self-pay | Admitting: *Deleted

## 2016-06-20 DIAGNOSIS — S62637B Displaced fracture of distal phalanx of left little finger, initial encounter for open fracture: Secondary | ICD-10-CM | POA: Diagnosis not present

## 2016-06-20 DIAGNOSIS — I252 Old myocardial infarction: Secondary | ICD-10-CM | POA: Diagnosis not present

## 2016-06-20 DIAGNOSIS — Y939 Activity, unspecified: Secondary | ICD-10-CM | POA: Diagnosis not present

## 2016-06-20 DIAGNOSIS — Y929 Unspecified place or not applicable: Secondary | ICD-10-CM | POA: Insufficient documentation

## 2016-06-20 DIAGNOSIS — W268XXA Contact with other sharp object(s), not elsewhere classified, initial encounter: Secondary | ICD-10-CM | POA: Diagnosis not present

## 2016-06-20 DIAGNOSIS — Y999 Unspecified external cause status: Secondary | ICD-10-CM | POA: Insufficient documentation

## 2016-06-20 DIAGNOSIS — F17210 Nicotine dependence, cigarettes, uncomplicated: Secondary | ICD-10-CM | POA: Insufficient documentation

## 2016-06-20 DIAGNOSIS — I10 Essential (primary) hypertension: Secondary | ICD-10-CM | POA: Diagnosis not present

## 2016-06-20 DIAGNOSIS — S6992XA Unspecified injury of left wrist, hand and finger(s), initial encounter: Secondary | ICD-10-CM | POA: Diagnosis present

## 2016-06-20 DIAGNOSIS — I739 Peripheral vascular disease, unspecified: Secondary | ICD-10-CM | POA: Diagnosis not present

## 2016-06-20 DIAGNOSIS — I2510 Atherosclerotic heart disease of native coronary artery without angina pectoris: Secondary | ICD-10-CM | POA: Diagnosis not present

## 2016-06-20 DIAGNOSIS — Z7982 Long term (current) use of aspirin: Secondary | ICD-10-CM | POA: Insufficient documentation

## 2016-06-20 DIAGNOSIS — Z79899 Other long term (current) drug therapy: Secondary | ICD-10-CM | POA: Insufficient documentation

## 2016-06-20 NOTE — ED Notes (Signed)
Laceration to his left 5th digit while putting a ladder up he sliced his finger on the metal. Bleeding controlled on arrival. Reinforced at triage with 4x4's and curlex.

## 2016-06-21 ENCOUNTER — Emergency Department (HOSPITAL_BASED_OUTPATIENT_CLINIC_OR_DEPARTMENT_OTHER): Payer: 59

## 2016-06-21 ENCOUNTER — Emergency Department (HOSPITAL_BASED_OUTPATIENT_CLINIC_OR_DEPARTMENT_OTHER)
Admission: EM | Admit: 2016-06-21 | Discharge: 2016-06-21 | Disposition: A | Discharge status: Home / Self Care | Payer: 59 | Attending: Emergency Medicine | Admitting: Emergency Medicine

## 2016-06-21 DIAGNOSIS — IMO0002 Reserved for concepts with insufficient information to code with codable children: Secondary | ICD-10-CM

## 2016-06-21 DIAGNOSIS — S62639B Displaced fracture of distal phalanx of unspecified finger, initial encounter for open fracture: Secondary | ICD-10-CM

## 2016-06-21 IMAGING — CR DG FINGER LITTLE 2+V*L*
3 series · 3 of 3 positions shown · non-contrast
Comparison: None.

CLINICAL DATA: 65-year-old male with laceration of the distal left
fifth digit

EXAM:
LEFT LITTLE FINGER 2+V

[x finger pa left]
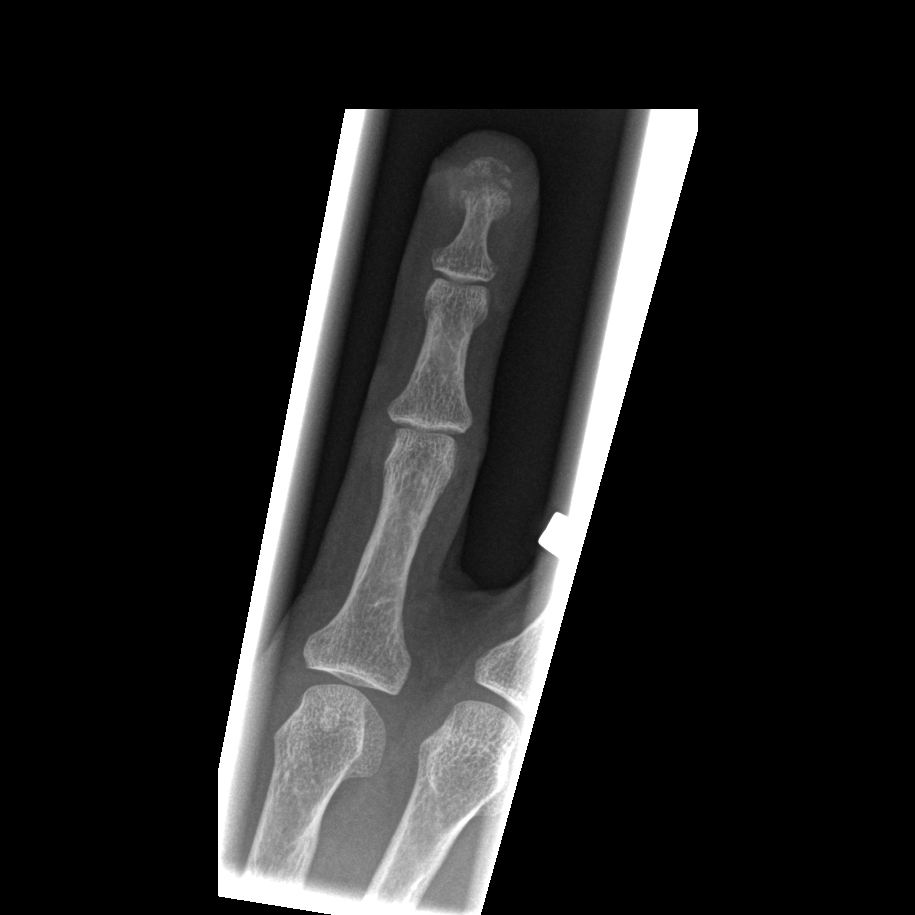

[x finger obl. left]
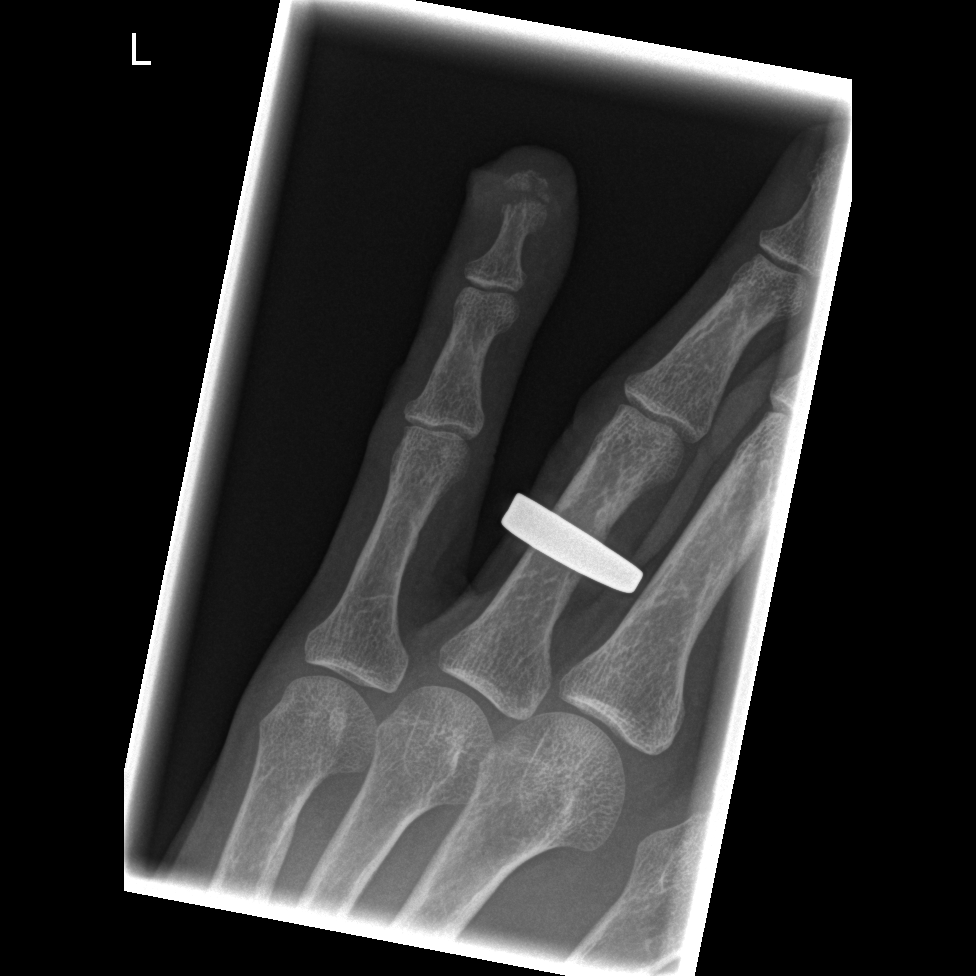

[x finger lateral left]
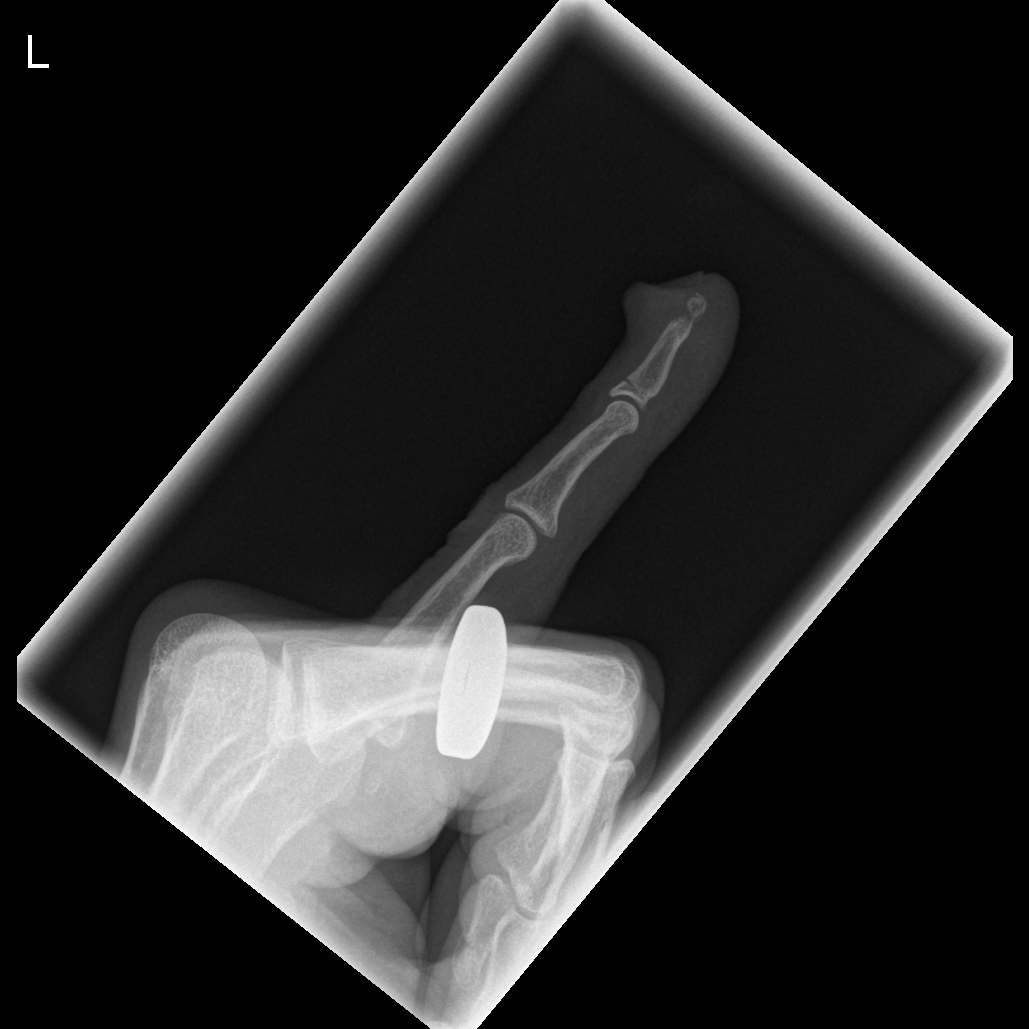

[3 of 3 positions shown; findings below may reference images not displayed]

FINDINGS: There is minimally displaced fracture of the tuft of the distal
phalanx of the fifth digit. The bones are mildly osteopenic. No
other acute fracture identified. There is laceration of the soft
tissues of the tip of the fifth digit. No radiopaque foreign object
identified.
IMPRESSION: Minimally displaced fracture of the tuft of the distal phalanx of
the fifth digit. No radiopaque foreign object.

## 2016-06-21 MED ORDER — TETANUS-DIPHTH-ACELL PERTUSSIS 5-2.5-18.5 LF-MCG/0.5 IM SUSP
0.5000 mL | Freq: Once | INTRAMUSCULAR | Status: DC
  Filled 2016-06-21: qty 0.5

## 2016-06-21 MED ORDER — HYDROCODONE-ACETAMINOPHEN 5-325 MG PO TABS: 1.0000 | ORAL_TABLET | Freq: Four times a day (QID) | ORAL | Status: DC | PRN

## 2016-06-21 MED ORDER — CEFAZOLIN IN D5W 1 GM/50ML IV SOLN
1.0000 g | Freq: Once | INTRAVENOUS | Status: AC
  Administered 2016-06-21: 1 g via INTRAVENOUS
  Filled 2016-06-21: qty 50

## 2016-06-21 MED ORDER — CEPHALEXIN 500 MG PO CAPS: 500.0000 mg | ORAL_CAPSULE | Freq: Two times a day (BID) | ORAL | Status: DC

## 2016-06-21 MED ORDER — LIDOCAINE HCL 2 % IJ SOLN
10.0000 mL | Freq: Once | INTRAMUSCULAR | Status: AC
  Administered 2016-06-21: 200 mg via INTRADERMAL
  Filled 2016-06-21: qty 20

## 2016-06-21 MED ORDER — HYDROCODONE-ACETAMINOPHEN 5-325 MG PO TABS
1.0000 | ORAL_TABLET | Freq: Once | ORAL | Status: AC
  Administered 2016-06-21: 1 via ORAL
  Filled 2016-06-21: qty 1

## 2016-06-21 NOTE — ED Provider Notes (Signed)
CSN: [id number]     Arrival date & time 06/20/16  2238 History   First MD Initiated Contact with Patient 06/21/16 0032     Chief Complaint  Patient presents with  . Laceration   Patient is a 65 y.o. male presenting with skin laceration.  Laceration Location:  Hand Hand laceration location:  L finger Depth:  Through underlying tissue Quality: avulsion   Bleeding: controlled with pressure   Laceration mechanism:  Metal edge Foreign body present:  No foreign bodies Relieved by:  Pressure Worsened by:  Movement Tetanus status:  Up to date   65 year old male presenting with a finger injury sustained PTA. Pt states he was carrying a metal ladder when he lost his grip. The metal edge of the ladder sliced the dorsal surface of his left fifth digit just below the nail. He reports moderate amount of bleeding as pt is on plavix. He applied 3 band aids and pressure then presented to ED. He reports severe pain to the distal finger tip. Denies full amputation of the finger tip. Denies loss of sensation at the finger. Movement at the finger exacerbates the pain. His last tetanus shot was 1 year ago. No other complaints today.   Past Medical History  Diagnosis Date  . Hypertension   . Tobacco abuse   . Ischemic cardiomyopathy   . CAD (coronary artery disease)   . MI (myocardial infarction) (HCC)   . PVD (peripheral vascular disease) (HCC)     thighs  . Allergy   . Prediabetes 09/01/2014   Past Surgical History  Procedure Laterality Date  . Toe surgery    . Appendectomy    . Knee surgery      fractured patella  . Mouth surgery    . Left heart cath N/A 10/29/2013    Procedure: LEFT HEART CATH;  Surgeon: Kathleene Hazel, MD;  Location: [facility] CATH LAB;  Service: Cardiovascular;  Laterality: N/A;  . Left heart catheterization with coronary angiogram N/A 10/31/2013    Procedure: LEFT HEART CATHETERIZATION WITH CORONARY ANGIOGRAM;  Surgeon: Kathleene Hazel, MD;  Location: [facility] CATH LAB;   Service: Cardiovascular;  Laterality: N/A;  . Abdominal aortagram N/A 06/07/2014    Procedure: ABDOMINAL Ronny Flurry;  Surgeon: Iran Ouch, MD;  Location: [facility] CATH LAB;  Service: Cardiovascular;  Laterality: N/A;  . Abdominal aortagram N/A 03/07/2015    Procedure: ABDOMINAL Ronny Flurry;  Surgeon: Iran Ouch, MD;  Location: [facility] CATH LAB;  Service: Cardiovascular;  Laterality: N/A;  . Coronary artery bypass graft N/A 11/04/2013    Procedure: CORONARY ARTERY BYPASS GRAFTING (CABG) TIMES FOUR  USING LEFT INTERNAL MAMMARY ARTERY AND RIGHT AND LEFT SAPHENOUS LEG VEIN HARVESTED ENDOSCOPICALLY;  Surgeon: Loreli Slot, MD;  Location: [facility] OR;  Service: Open Heart Surgery;  Laterality: N/A;  . Colonoscopy with propofol N/A 10/11/2015    Procedure: COLONOSCOPY WITH PROPOFOL;  Surgeon: Charna Elizabeth, MD;  Location: [facility] ENDOSCOPY;  Service: Endoscopy;  Laterality: N/A;   Family History  Problem Relation Age of Onset  . CAD Father 62  . AAA (abdominal aortic aneurysm) Father   . Alcohol abuse Father   . Hypertension Father   . Diabetes Father   . Cardiomyopathy Daughter   . Stroke Mother   . Arthritis Mother   . Heart disease Mother   . Hypertension Mother   . Colon cancer Neg Hx   . Prostate cancer Neg Hx   . Heart attack Mother   . Heart attack  Father    Social History  Substance Use Topics  . Smoking status: Current Every Day Smoker -- 0.25 packs/day for 30 years    Types: Cigarettes  . Smokeless tobacco: Never Used     Comment: < 1/2 ppd  . Alcohol Use: No    Review of Systems  All other systems reviewed and are negative.     Allergies  Ace inhibitors; Dairy aid; Eggs or egg-derived products; and Peanut-containing drug products  Home Medications   Prior to Admission medications   Medication Sig Start Date End Date Taking? Authorizing Provider  aspirin EC 81 MG tablet Take 1 tablet (81 mg total) by mouth daily. 06/30/14   Kathleene Hazelhristopher D McAlhany, MD  atorvastatin (LIPITOR)  80 MG tablet TAKE 1 TABLET BY MOUTH  DAILY AT 6 PM 05/02/16   Kathleene Hazelhristopher D McAlhany, MD  cephALEXin (KEFLEX) 500 MG capsule Take 1 capsule (500 mg total) by mouth 2 (two) times daily. 06/21/16   Lycia Sachdeva, PA-C  cilostazol (PLETAL) 100 MG tablet Take 1 tablet (100 mg total) by mouth 2 (two) times daily. 10/09/15   Iran OuchMuhammad A Arida, MD  GINSENG PO Take 1 tablet by mouth daily.    Historical Provider, MD  HYDROcodone-acetaminophen (NORCO/VICODIN) 5-325 MG tablet Take 1 tablet by mouth every 6 (six) hours as needed. 06/21/16   Chinaza Rooke, PA-C  irbesartan (AVAPRO) 300 MG tablet Take 1 tablet (300 mg total) by mouth daily. 04/08/16   Iran OuchMuhammad A Arida, MD  metoprolol tartrate (LOPRESSOR) 25 MG tablet Take 1 tablet by mouth two  times daily 05/02/16   Kathleene Hazelhristopher D McAlhany, MD  nitroGLYCERIN (NITROSTAT) 0.4 MG SL tablet Place 1 tablet (0.4 mg total) under the tongue every 5 (five) minutes as needed for chest pain. 10/09/15   Iran OuchMuhammad A Arida, MD   BP 146/90 mmHg  Pulse 63  Temp(Src) 98.6 F (37 C) (Oral)  Resp 16  Ht 6\' 2"  (1.88 m)  Wt 81.194 kg  BMI 22.97 kg/m2  SpO2 100% Physical Exam  Constitutional: He appears well-developed and well-nourished. No distress.  HENT:  Head: Normocephalic and atraumatic.  Right Ear: External ear normal.  Left Ear: External ear normal.  Eyes: Conjunctivae are normal. Right eye exhibits no discharge. Left eye exhibits no discharge. No scleral icterus.  Neck: Normal range of motion.  Cardiovascular: Normal rate.   Cap refill 3 seconds  Pulmonary/Chest: Effort normal.  Musculoskeletal: Normal range of motion.  Restricted ROM at left distal finger. Near amputation injury. See picture below.  Neurological: He is alert. Coordination normal.  Sensation intact distally  Skin: Skin is warm and dry.  A deep avulsion injury is noted to the distal left fifth digit just proximal to the nail bed. Injury does not directly involve the nail but appears to extend  underneath the nail bed. Tip of finger is mobile with palpation. No bone visible. Bleeding controlled at this time. After significant irrigation, no foreign bodies present.   See picture below  Psychiatric: He has a normal mood and affect. His behavior is normal.  Nursing note and vitals reviewed.      ED Course  Procedures (including critical care time) Labs Review Labs Reviewed - No data to display  Imaging Review Dg Finger Little Left  06/21/2016  CLINICAL DATA:  [age] year old male with laceration of the distal left fifth digit EXAM: LEFT LITTLE FINGER 2+V COMPARISON:  None. FINDINGS: There is minimally displaced fracture of the tuft of the distal phalanx of the fifth  digit. The bones are mildly osteopenic. No other acute fracture identified. There is laceration of the soft tissues of the tip of the fifth digit. No radiopaque foreign object identified. IMPRESSION: Minimally displaced fracture of the tuft of the distal phalanx of the fifth digit. No radiopaque foreign object. Electronically Signed   By: Elgie CollardArash  Radparvar M.D.   On: 06/21/2016 01:15   I have personally reviewed and evaluated these images and lab results as part of my medical decision-making.   EKG Interpretation None      MDM   Final diagnoses:  Laceration  Open fracture of tuft of distal phalanx of finger, initial encounter   65 year old male presenting with near complete avulsion injury to the left fifth digit after losing grip of a metal ladder. Deep laceration noted to the distal fingertip as described above. Bleeding controlled at this time. Tetanus is UTD. Xray shows tuft fracture without foreign bodies. Consulted hand surgery and discussed case with Dr. Izora Ribasoley who was able to view pictures of the injury. Hand surgery recommends extensive irrigation and xeroform dressing to keep finger tip in place. Dr. Izora Ribasoley does not believe sutures are beneficial in this case as the tissue is likely already unviable. Performed  digital ring block and extensively irrigated the wound by jet lavage. Pt tolerated this well. Finger dressed with xeroform per Dr. Izora Ribasoley. One dose ancef given in ED. Will discharge with keflex and short course of pain medicine. Pt is to call Dr. Izora Ribasoley in two days to schedule follow up. Pt and family state understanding. Return precautions given in discharge paperwork and discussed with pt at bedside. Pt stable for discharge    Alveta HeimlichStevi Nasim Habeeb, PA-C 06/21/16 1022  Paula LibraJohn Molpus, MD 06/24/16 1333

## 2016-06-21 NOTE — ED Notes (Signed)
Pt and family verbalize understanding of dc instructions and deny any further needs at this time 

## 2016-06-21 NOTE — Discharge Instructions (Signed)
Call Dr. Debby Budoley's office on Monday to schedule an appointment.    Finger Fracture Fractures of fingers are breaks in the bones of the fingers. There are many types of fractures. There are different ways of treating these fractures. Your health care provider will discuss the best way to treat your fracture. CAUSES Traumatic injury is the main cause of broken fingers. These include:  Injuries while playing sports.  Workplace injuries.  Falls. RISK FACTORS Activities that can increase your risk of finger fractures include:  Sports.  Workplace activities that involve machinery.  A condition called osteoporosis, which can make your bones less dense and cause them to fracture more easily. SIGNS AND SYMPTOMS The main symptoms of a broken finger are pain and swelling within 15 minutes after the injury. Other symptoms include:  Bruising of your finger.  Stiffness of your finger.  Numbness of your finger.  Exposed bones (compound fracture) if the fracture is severe. DIAGNOSIS  The best way to diagnose a broken bone is with X-ray imaging. Additionally, your health care provider will use this X-ray image to evaluate the position of the broken finger bones.  TREATMENT  Finger fractures can be treated with:   Nonreduction--This means the bones are in place. The finger is splinted without changing the positions of the bone pieces. The splint is usually left on for about a week to 10 days. This will depend on your fracture and what your health care provider thinks.  Closed reduction--The bones are put back into position without using surgery. The finger is then splinted.  Open reduction and internal fixation--The fracture site is opened. Then the bone pieces are fixed into place with pins or some type of hardware. This is seldom required. It depends on the severity of the fracture. HOME CARE INSTRUCTIONS   Follow your health care provider's instructions regarding activities, exercises, and  physical therapy.  Only take over-the-counter or prescription medicines for pain, discomfort, or fever as directed by your health care provider. SEEK MEDICAL CARE IF: You have pain or swelling that limits the motion or use of your fingers. SEEK IMMEDIATE MEDICAL CARE IF:  Your finger becomes numb. MAKE SURE YOU:   Understand these instructions.  Will watch your condition.  Will get help right away if you are not doing well or get worse.   This information is not intended to replace advice given to you by your health care provider. Make sure you discuss any questions you have with your health care provider.   Document Released: 03/22/2001 Document Revised: 09/28/2013 Document Reviewed: 07/20/2013 Elsevier Interactive Patient Education [employer]2016 [employer]vier Inc.

## 2016-07-07 ENCOUNTER — Other Ambulatory Visit: Payer: Self-pay | Admitting: Cardiovascular Disease

## 2016-07-08 ENCOUNTER — Other Ambulatory Visit: Payer: Self-pay

## 2016-07-08 ENCOUNTER — Encounter: Payer: Self-pay | Admitting: Internal Medicine

## 2016-07-08 ENCOUNTER — Ambulatory Visit (INDEPENDENT_AMBULATORY_CARE_PROVIDER_SITE_OTHER): Payer: 59 | Admitting: Internal Medicine

## 2016-07-08 VITALS — BP 126/74 | HR 49 | Temp 98.3°F | Ht 72.0 in | Wt 174.5 lb

## 2016-07-08 DIAGNOSIS — I10 Essential (primary) hypertension: Secondary | ICD-10-CM | POA: Diagnosis not present

## 2016-07-08 DIAGNOSIS — I2510 Atherosclerotic heart disease of native coronary artery without angina pectoris: Secondary | ICD-10-CM | POA: Diagnosis not present

## 2016-07-08 DIAGNOSIS — Z09 Encounter for follow-up examination after completed treatment for conditions other than malignant neoplasm: Secondary | ICD-10-CM | POA: Insufficient documentation

## 2016-07-08 DIAGNOSIS — Z720 Tobacco use: Secondary | ICD-10-CM | POA: Diagnosis not present

## 2016-07-08 DIAGNOSIS — R739 Hyperglycemia, unspecified: Secondary | ICD-10-CM | POA: Diagnosis not present

## 2016-07-08 LAB — CBC WITH DIFFERENTIAL/PLATELET
Basophils Absolute: 0 10*3/uL (ref 0.0–0.1)
Basophils Relative: 0.7 % (ref 0.0–3.0)
Eosinophils Absolute: 0.5 10*3/uL (ref 0.0–0.7)
Eosinophils Relative: 6.1 % — ABNORMAL HIGH (ref 0.0–5.0)
HCT: 40.5 % (ref 39.0–52.0)
Hemoglobin: 13.4 g/dL (ref 13.0–17.0)
Lymphocytes Relative: 30.2 % (ref 12.0–46.0)
Lymphs Abs: 2.2 10*3/uL (ref 0.7–4.0)
MCHC: 33.2 g/dL (ref 30.0–36.0)
MCV: 82.6 fl (ref 78.0–100.0)
Monocytes Absolute: 0.3 10*3/uL (ref 0.1–1.0)
Monocytes Relative: 4.6 % (ref 3.0–12.0)
Neutro Abs: 4.3 10*3/uL (ref 1.4–7.7)
Neutrophils Relative %: 58.4 % (ref 43.0–77.0)
Platelets: 249 10*3/uL (ref 150.0–400.0)
RBC: 4.9 Mil/uL (ref 4.22–5.81)
RDW: 14.1 % (ref 11.5–15.5)
WBC: 7.4 10*3/uL (ref 4.0–10.5)

## 2016-07-08 LAB — BASIC METABOLIC PANEL
BUN: 17 mg/dL (ref 6–23)
CO2: 25 mEq/L (ref 19–32)
Calcium: 9.3 mg/dL (ref 8.4–10.5)
Chloride: 107 mEq/L (ref 96–112)
Creatinine, Ser: 0.98 mg/dL (ref 0.40–1.50)
GFR: 99.32 mL/min (ref >60.00)
Glucose, Bld: 103 mg/dL — ABNORMAL HIGH (ref 70–99)
Potassium: 4 mEq/L (ref 3.5–5.1)
Sodium: 137 mEq/L (ref 135–145)

## 2016-07-08 LAB — HEMOGLOBIN A1C: Hgb A1c MFr Bld: 6 % (ref 4.6–6.5)

## 2016-07-08 MED ORDER — CILOSTAZOL 100 MG PO TABS: 100.0000 mg | ORAL_TABLET | Freq: Two times a day (BID) | ORAL | Status: DC

## 2016-07-08 NOTE — Patient Instructions (Signed)
GO TO THE LAB : Get the blood work     GO TO THE FRONT DESK Schedule your next appointment for a  physical exam by January 2018  

## 2016-07-08 NOTE — Assessment & Plan Note (Signed)
Prediabetes: Check A1c, encourage exercise HTN: Continue Avapro, Lopressor, check a BMP and CBC CV: Seems stable. Open fracture finger: Was seen and treated by 6 surgery, doing great. Tobacco abuse: Still smoking, evidently he is not ready to quit; he has chronic marriage  issues that keep him stressed, counseled, recommend to see a Pharmacist, hospitalprofessional counselor, states he won't.  RTC CPX 12-2016

## 2016-07-08 NOTE — Progress Notes (Signed)
Pre visit review using our clinic review tool, if applicable. No additional management support is needed unless otherwise documented below in the visit note. 

## 2016-07-08 NOTE — Progress Notes (Signed)
Subjective:    Patient ID: Lonnie [name], male    DOB: 27-Mar-1951, 65 y.o.   MRN: [id number]  DOS:  07/08/2016 Type of visit - description :  Routine visit Interval history: Had a open finger fracture, note from the ER reviewed, doing well at this point. HTN: Good med compliance, not ambulatory BPs CV: note from cardiology and vascular   reviewed  Tobacco: Still smoking.   Review of Systems No chest pain, difficulty breathing or claudication (had calf pain 1 few weeks ago, no recurrent symptoms) No nausea, vomiting, diarrhea or blood in the stools  Past Medical History  Diagnosis Date  . Hypertension   . Tobacco abuse   . Ischemic cardiomyopathy   . CAD (coronary artery disease)   . MI (myocardial infarction) (HCC)   . PVD (peripheral vascular disease) (HCC)     thighs  . Allergy   . Prediabetes 09/01/2014    Past Surgical History  Procedure Laterality Date  . Toe surgery    . Appendectomy    . Knee surgery      fractured patella  . Mouth surgery    . Left heart cath N/A 10/29/2013    Procedure: LEFT HEART CATH;  Surgeon: Kathleene Hazel, MD;  Location: [facility] CATH LAB;  Service: Cardiovascular;  Laterality: N/A;  . Left heart catheterization with coronary angiogram N/A 10/31/2013    Procedure: LEFT HEART CATHETERIZATION WITH CORONARY ANGIOGRAM;  Surgeon: Kathleene Hazel, MD;  Location: [facility] CATH LAB;  Service: Cardiovascular;  Laterality: N/A;  . Abdominal aortagram N/A 06/07/2014    Procedure: ABDOMINAL Ronny Flurry;  Surgeon: Iran Ouch, MD;  Location: [facility] CATH LAB;  Service: Cardiovascular;  Laterality: N/A;  . Abdominal aortagram N/A 03/07/2015    Procedure: ABDOMINAL Ronny Flurry;  Surgeon: Iran Ouch, MD;  Location: [facility] CATH LAB;  Service: Cardiovascular;  Laterality: N/A;  . Coronary artery bypass graft N/A 11/04/2013    Procedure: CORONARY ARTERY BYPASS GRAFTING (CABG) TIMES FOUR  USING LEFT INTERNAL MAMMARY ARTERY AND RIGHT AND LEFT SAPHENOUS LEG  VEIN HARVESTED ENDOSCOPICALLY;  Surgeon: Loreli Slot, MD;  Location: [facility] OR;  Service: Open Heart Surgery;  Laterality: N/A;  . Colonoscopy with propofol N/A 10/11/2015    Procedure: COLONOSCOPY WITH PROPOFOL;  Surgeon: Charna Elizabeth, MD;  Location: [facility] ENDOSCOPY;  Service: Endoscopy;  Laterality: N/A;    Social History   Social History  . Marital Status: Married    Spouse Name: [place]  . Number of Children: 1  . Years of Education: N/A   Occupational History  . long term disability --Photographer   Social History Main Topics  . Smoking status: Current Every Day Smoker -- 0.25 packs/day for 30 years    Types: Cigarettes  . Smokeless tobacco: Never Used     Comment: < 1/2 ppd  . Alcohol Use: No  . Drug Use: No  . Sexual Activity: Yes   Other Topics Concern  . Not on file   Social History Narrative   Household-- pt and wife   1 daughter         Medication List       This list is accurate as of: 07/08/16  6:47 PM.  Always use your most recent med list.               aspirin EC 81 MG tablet  Take 1 tablet (81 mg total) by mouth daily.     atorvastatin 80 MG tablet  Commonly  known as:  LIPITOR  TAKE 1 TABLET BY MOUTH  DAILY AT 6 PM     cilostazol 100 MG tablet  Commonly known as:  PLETAL  Take 1 tablet (100 mg total) by mouth 2 (two) times daily.     GINSENG PO  Take 1 tablet by mouth daily.     irbesartan 300 MG tablet  Commonly known as:  AVAPRO  Take 1 tablet (300 mg total) by mouth daily.     metoprolol tartrate 25 MG tablet  Commonly known as:  LOPRESSOR  Take 1 tablet by mouth two  times daily     nitroGLYCERIN 0.4 MG SL tablet  Commonly known as:  NITROSTAT  Place 1 tablet (0.4 mg total) under the tongue every 5 (five) minutes as needed for chest pain.           Objective:   Physical Exam BP 126/74 mmHg  Pulse 49  Temp(Src) 98.3 F (36.8 C) (Oral)  Ht 6' (1.829 m)  Wt 174 lb 8 oz (79.153 kg)  BMI 23.66 kg/m2  SpO2  97% General:   Well developed, well nourished . NAD.  HEENT:  Normocephalic . Face symmetric, atraumatic Lungs:  CTA B Normal respiratory effort, no intercostal retractions, no accessory muscle use. Heart: RRR,  no murmur.  No pretibial edema bilaterally  Skin: Not pale. Not jaundice Neurologic:  alert & oriented X3.  Speech normal, gait appropriate for age and unassisted Psych--  Cognition and judgment appear intact.  Cooperative with normal attention span and concentration.  Behavior appropriate. No anxious or depressed appearing.      Assessment & Plan:   Assessment Prediabetes  dx 08-2014 Feet care; saw podiatry 2016 for calluses  HTN CAD, MI , cath 10-2013 ---> CABG Ischemic cardiomyopathy Peripheral vascular disease Tobacco abuse -- intolerant to chantix before, tried Wellbutrin 2016 (not much help) Onychomycosis-- rx lamisil per podiatry 12-2015  PLAN: Prediabetes: Check A1c, encourage exercise HTN: Continue Avapro, Lopressor, check a BMP and CBC CV: Seems stable. Open fracture finger: Was seen and treated by 6 surgery, doing great. Tobacco abuse: Still smoking, evidently he is not ready to quit; he has chronic marriage  issues that keep him stressed, counseled, recommend to see a Pharmacist, hospitalprofessional counselor, states he won't.  RTC CPX 12-2016

## 2017-01-12 ENCOUNTER — Telehealth: Payer: Self-pay | Admitting: Cardiovascular Disease

## 2017-01-12 NOTE — Telephone Encounter (Signed)
Lonnie [name] is requesting a call back, thanks.

## 2017-01-12 NOTE — Telephone Encounter (Signed)
Spoke to wife of patient, who noted she had concerns regarding patient's mental state. Patient has an appt tomorrow, and wife was hoping to be able to speak to Dr. Kirke CorinArida privately tomorrow about these concerns. Informed her I would send msg so he is aware.  She notes he can also speak w patient's daughter, Sunny SchleinFelicia, who is on DPR list and available at [phone].

## 2017-01-13 ENCOUNTER — Ambulatory Visit (INDEPENDENT_AMBULATORY_CARE_PROVIDER_SITE_OTHER): Payer: 59 | Admitting: Cardiovascular Disease

## 2017-01-13 ENCOUNTER — Encounter: Payer: Self-pay | Admitting: Cardiovascular Disease

## 2017-01-13 VITALS — BP 140/80 | HR 62 | Ht 72.0 in | Wt 175.8 lb

## 2017-01-13 DIAGNOSIS — I739 Peripheral vascular disease, unspecified: Secondary | ICD-10-CM | POA: Diagnosis not present

## 2017-01-13 DIAGNOSIS — Z720 Tobacco use: Secondary | ICD-10-CM | POA: Diagnosis not present

## 2017-01-13 DIAGNOSIS — I25810 Atherosclerosis of coronary artery bypass graft(s) without angina pectoris: Secondary | ICD-10-CM

## 2017-01-13 DIAGNOSIS — I10 Essential (primary) hypertension: Secondary | ICD-10-CM | POA: Diagnosis not present

## 2017-01-13 DIAGNOSIS — E785 Hyperlipidemia, unspecified: Secondary | ICD-10-CM

## 2017-01-13 NOTE — Patient Instructions (Addendum)
Medication Instructions:  Your physician recommends that you continue on your current medications as directed. Please refer to the Current Medication list given to you today.  Labwork: No new orders.   Testing/Procedures: No new orders.   Follow-Up: Your physician wants you to follow-up in: 1 YEAR with Dr Kirke CorinArida. You will receive a reminder letter in the mail two months in advance. If you don't receive a letter, please call our office to schedule the follow-up appointment.   Any Other Special Instructions Will Be Listed Below (If Applicable).     If you need a refill on your cardiac medications before your next appointment, please call your pharmacy.   Steps to Quit Smoking Smoking tobacco can be harmful to your health and can affect almost every organ in your body. Smoking puts you, and those around you, at risk for developing many serious chronic diseases. Quitting smoking is difficult, but it is one of the best things that you can do for your health. It is never too late to quit. What are the benefits of quitting smoking? When you quit smoking, you lower your risk of developing serious diseases and conditions, such as:  Lung cancer or lung disease, such as COPD.  Heart disease.  Stroke.  Heart attack.  Infertility.  Osteoporosis and bone fractures. Additionally, symptoms such as coughing, wheezing, and shortness of breath may get better when you quit. You may also find that you get sick less often because your body is stronger at fighting off colds and infections. If you are pregnant, quitting smoking can help to reduce your chances of having a baby of low birth weight. How do I get ready to quit? When you decide to quit smoking, create a plan to make sure that you are successful. Before you quit:  Pick a date to quit. Set a date within the next two weeks to give you time to prepare.  Write down the reasons why you are quitting. Keep this list in places where you will see  it often, such as on your bathroom mirror or in your car or wallet.  Identify the people, places, things, and activities that make you want to smoke (triggers) and avoid them. Make sure to take these actions:  Throw away all cigarettes at home, at work, and in your car.  Throw away smoking accessories, such as Set designerashtrays and lighters.  Clean your car and make sure to empty the ashtray.  Clean your home, including curtains and carpets.  Tell your family, friends, and coworkers that you are quitting. Support from your loved ones can make quitting easier.  Talk with your health care provider about your options for quitting smoking.  Find out what treatment options are covered by your health insurance. What strategies can I use to quit smoking? Talk with your healthcare provider about different strategies to quit smoking. Some strategies include:  Quitting smoking altogether instead of gradually lessening how much you smoke over a period of time. Research shows that quitting "cold [place]key" is more successful than gradually quitting.  Attending in-person counseling to help you build problem-solving skills. You are more likely to have success in quitting if you attend several counseling sessions. Even short sessions of 10 minutes can be effective.  Finding resources and support systems that can help you to quit smoking and remain smoke-free after you quit. These resources are most helpful when you use them often. They can include:  Online chats with a Veterinary surgeoncounselor.  Telephone quitlines.  Printed self-help  materials.  Support groups or group counseling.  Text messaging programs.  Mobile phone applications.  Taking medicines to help you quit smoking. (If you are pregnant or breastfeeding, talk with your health care provider first.) Some medicines contain nicotine and some do not. Both types of medicines help with cravings, but the medicines that include nicotine help to relieve withdrawal  symptoms. Your health care provider may recommend:  Nicotine patches, gum, or lozenges.  Nicotine inhalers or sprays.  Non-nicotine medicine that is taken by mouth. Talk with your health care provider about combining strategies, such as taking medicines while you are also receiving in-person counseling. Using these two strategies together makes you more likely to succeed in quitting than if you used either strategy on its own. If you are pregnant or breastfeeding, talk with your health care provider about finding counseling or other support strategies to quit smoking. Do not take medicine to help you quit smoking unless told to do so by your health care provider. What things can I do to make it easier to quit? Quitting smoking might feel overwhelming at first, but there is a lot that you can do to make it easier. Take these important actions:  Reach out to your family and friends and ask that they support and encourage you during this time. Call telephone quitlines, reach out to support groups, or work with a counselor for support.  Ask people who smoke to avoid smoking around you.  Avoid places that trigger you to smoke, such as bars, parties, or smoke-break areas at work.  Spend time around people who do not smoke.  Lessen stress in your life, because stress can be a smoking trigger for some people. To lessen stress, try:  Exercising regularly.  Deep-breathing exercises.  Yoga.  Meditating.  Performing a body scan. This involves closing your eyes, scanning your body from head to toe, and noticing which parts of your body are particularly tense. Purposefully relax the muscles in those areas.  Download or purchase mobile phone or tablet apps (applications) that can help you stick to your quit plan by providing reminders, tips, and encouragement. There are many free apps, such as QuitGuide from the [employer] developer for Disease Control and Prevention). You can find other support for  quitting smoking (smoking cessation) through smokefree.gov and other websites. How will I feel when I quit smoking? Within the first 24 hours of quitting smoking, you may start to feel some withdrawal symptoms. These symptoms are usually most noticeable 2-3 days after quitting, but they usually do not last beyond 2-3 weeks. Changes or symptoms that you might experience include:  Mood swings.  Restlessness, anxiety, or irritation.  Difficulty concentrating.  Dizziness.  Strong cravings for sugary foods in addition to nicotine.  Mild weight gain.  Constipation.  Nausea.  Coughing or a sore throat.  Changes in how your medicines work in your body.  A depressed mood.  Difficulty sleeping (insomnia). After the first 2-3 weeks of quitting, you may start to notice more positive results, such as:  Improved sense of smell and taste.  Decreased coughing and sore throat.  Slower heart rate.  Lower blood pressure.  Clearer skin.  The ability to breathe more easily.  Fewer sick days. Quitting smoking is very challenging for most people. Do not get discouraged if you are not successful the first time. Some people need to make many attempts to quit before they achieve long-term success. Do your best to stick to your quit plan, and  talk with your health care provider if you have any questions or concerns. This information is not intended to replace advice given to you by your health care provider. Make sure you discuss any questions you have with your health care provider. Document Released: 12/02/2001 Document Revised: 08/05/2016 Document Reviewed: 04/24/2015 Elsevier Interactive Patient Education  2017 [employer].

## 2017-01-13 NOTE — Progress Notes (Signed)
Primary Care Physician: Dr. Earlene Plater ([facility]) Primary cardiologist: Dr. Clifton James   History of Present Illness: This is a very pleasant 66 year old man who is here today for a followup visit regarding peripheral arterial disease. He has known history of tobacco abuse, HTN and CAD . He was admitted to [facility] 10/29/13 with anterior STEMI and found to have moderately severe left main and LAD stenosis, moderately severe RCA stenosis. He underwent 4V CABG on 11/04/13 per Dr. Dorris Fetch (LIMA to mid LAD, SVG to OM, SVG to PDA, SVG to Diagonal). LVEF=40-45% prior to bypass.  He has known history of prior intervention on the left SFA for total occlusion which subsequently reoccluded.He has been treated medically with cilostazol with resolution of claudication.    He has been doing well overall with no chest pain or shortness of breath. He reports stable left calf claudication which happens after walking 1 mile. This is not lifestyle limiting. He has been taking his medications regularly but unfortunately he continues to smoke 10 cigarettes a day.  Last Lipid Profile:Lipid Panel     Component Value Date/Time   CHOL 104 01/09/2016 0848   TRIG 39.0 01/09/2016 0848   HDL 45.80 01/09/2016 0848   CHOLHDL 2 01/09/2016 0848   VLDL 7.8 01/09/2016 0848   LDLCALC 50 01/09/2016 0848     Past Medical History:  Diagnosis Date  . Allergy   . CAD (coronary artery disease)   . Hypertension   . Ischemic cardiomyopathy   . MI (myocardial infarction)   . Prediabetes 09/01/2014  . PVD (peripheral vascular disease) (HCC)    thighs  . Tobacco abuse     Past Surgical History:  Procedure Laterality Date  . ABDOMINAL AORTAGRAM N/A 06/07/2014   Procedure: ABDOMINAL Ronny Flurry;  Surgeon: Iran Ouch, MD;  Location: [facility] CATH LAB;  Service: Cardiovascular;  Laterality: N/A;  . ABDOMINAL AORTAGRAM N/A 03/07/2015   Procedure: ABDOMINAL Ronny Flurry;  Surgeon: Iran Ouch, MD;   Location: [facility] CATH LAB;  Service: Cardiovascular;  Laterality: N/A;  . APPENDECTOMY    . COLONOSCOPY WITH PROPOFOL N/A 10/11/2015   Procedure: COLONOSCOPY WITH PROPOFOL;  Surgeon: Charna Elizabeth, MD;  Location: [facility] ENDOSCOPY;  Service: Endoscopy;  Laterality: N/A;  . CORONARY ARTERY BYPASS GRAFT N/A 11/04/2013   Procedure: CORONARY ARTERY BYPASS GRAFTING (CABG) TIMES FOUR  USING LEFT INTERNAL MAMMARY ARTERY AND RIGHT AND LEFT SAPHENOUS LEG VEIN HARVESTED ENDOSCOPICALLY;  Surgeon: Loreli Slot, MD;  Location: [facility] OR;  Service: Open Heart Surgery;  Laterality: N/A;  . KNEE SURGERY     fractured patella  . LEFT HEART CATH N/A 10/29/2013   Procedure: LEFT HEART CATH;  Surgeon: Kathleene Hazel, MD;  Location[facility] CATH LAB;  Service: Cardiovascular;  Laterality: N/A;  . LEFT HEART CATHETERIZATION WITH CORONARY ANGIOGRAM N/A 10/31/2013   Procedure: LEFT HEART CATHETERIZATION WITH CORONARY ANGIOGRAM;  Surgeon: Kathleene Hazel, MD;  Location: [facility] CATH LAB;  Service: Cardiovascular;  Laterality: N/A;  . MOUTH SURGERY    . TOE SURGERY      Current Outpatient Prescriptions  Medication Sig Dispense Refill  . aspirin EC 81 MG tablet Take 1 tablet (81 mg total) by mouth daily. 90 tablet 3  . atorvastatin (LIPITOR) 80 MG tablet TAKE 1 TABLET BY MOUTH  DAILY AT 6 PM 90 tablet 3  . cilostazol (PLETAL) 100 MG tablet Take 1 tablet (100 mg total) by mouth 2 (two) times daily. 180 tablet 3  . GINSENG PO  Take 1 tablet by mouth daily.    . irbesartan (AVAPRO) 300 MG tablet Take 1 tablet (300 mg total) by mouth daily. 90 tablet 3  . metoprolol tartrate (LOPRESSOR) 25 MG tablet Take 1 tablet by mouth two  times daily 180 tablet 3  . nitroGLYCERIN (NITROSTAT) 0.4 MG SL tablet Place 1 tablet (0.4 mg total) under the tongue every 5 (five) minutes as needed for chest pain. 25 tablet 6   No current facility-administered medications for this visit.     Allergies  Allergen Reactions  . Ace Inhibitors  Swelling    Angioedema  . Dairy Aid [Lactase]     gas  . Eggs Or Egg-Derived Products     Cannot eat Prepared Eggs  . Peanut-Containing Drug Products Nausea And Vomiting    Does not know which nuts, but states nuts make him vomit    Social History   Social History  . Marital status: Married    Spouse name: [name]  . Number of children: 1  . Years of education: N/A   Occupational History  . long term disability --Photographer   Social History Main Topics  . Smoking status: Current Every Day Smoker    Packs/day: 0.25    Years: 30.00    Types: Cigarettes  . Smokeless tobacco: Never Used     Comment: < 1/2 ppd  . Alcohol use No  . Drug use: No  . Sexual activity: Yes   Other Topics Concern  . Not on file   Social History Narrative   Household-- pt and wife   1 daughter     Family History  Problem Relation Age of Onset  . CAD Father 20  . AAA (abdominal aortic aneurysm) Father   . Alcohol abuse Father   . Hypertension Father   . Diabetes Father   . Cardiomyopathy Daughter   . Stroke Mother   . Arthritis Mother   . Heart disease Mother   . Hypertension Mother   . Colon cancer Neg Hx   . Prostate cancer Neg Hx   . Heart attack Mother   . Heart attack Father     Review of Systems:  As stated in the HPI and otherwise negative.   BP 140/80   Pulse 62   Ht 6' (1.829 m)   Wt 175 lb 12.8 oz (79.7 kg)   BMI 23.84 kg/m   Physical Examination: General: Well developed, well nourished, NAD HEENT: OP clear, mucus membranes moist SKIN: warm, dry. No rashes. Neuro: No focal deficits Musculoskeletal: Muscle strength 5/5 all ext Psychiatric: Mood and affect normal Neck: No JVD, no carotid bruits, no thyromegaly, no lymphadenopathy. Lungs:Clear bilaterally, no wheezes, rhonci, crackles Cardiovascular: Regular rate and rhythm. No murmurs, gallops or rubs. Abdomen:Soft. Bowel sounds present. Non-tender.  Extremities: No lower extremity edema.  vascular:  Femoral pulses are normal bilaterally. Posterior tibial: +2 on the right and absent on the left side. Dorsalis pedis: Normal on the right side and absent on the left side.    Assessment and Plan:    1. peripheral arterial disease :  Known occluded distal left SFA within the previously placed stents. Claudication is minimal at the present time with cilostazol. Thus, I recommend continuing medical therapy.   2. CAD: Stable s/p CABG November 2014. . Doing well. Continue ASA, statin, beta blocker.   3. Essential hypertension: Blood pressure is reasonably controlled on current medications.  4. Hyperlipidemia: Continue high-dose atorvastatin with target LDL <70  5. Tobacco use: I discussed with him the importance of smoking cessation.    Disposition:follow-up in 12 months  Lorine BearsMuhammad Odetta Forness, MD

## 2017-01-16 ENCOUNTER — Ambulatory Visit (INDEPENDENT_AMBULATORY_CARE_PROVIDER_SITE_OTHER): Payer: 59 | Admitting: Internal Medicine

## 2017-01-16 ENCOUNTER — Encounter: Payer: Self-pay | Admitting: Internal Medicine

## 2017-01-16 VITALS — BP 128/78 | HR 75 | Temp 98.5°F | Resp 14 | Ht 72.0 in | Wt 175.0 lb

## 2017-01-16 DIAGNOSIS — Z0000 Encounter for general adult medical examination without abnormal findings: Secondary | ICD-10-CM

## 2017-01-16 DIAGNOSIS — Z1159 Encounter for screening for other viral diseases: Secondary | ICD-10-CM

## 2017-01-16 LAB — BASIC METABOLIC PANEL
BUN: 13 mg/dL (ref 6–23)
CO2: 29 mEq/L (ref 19–32)
Calcium: 9.5 mg/dL (ref 8.4–10.5)
Chloride: 105 mEq/L (ref 96–112)
Creatinine, Ser: 1.04 mg/dL (ref 0.40–1.50)
GFR: 92.58 mL/min (ref >60.00)
Glucose, Bld: 99 mg/dL (ref 70–99)
Potassium: 4.3 mEq/L (ref 3.5–5.1)
Sodium: 140 mEq/L (ref 135–145)

## 2017-01-16 LAB — ALT: ALT: 20 U/L (ref 0–53)

## 2017-01-16 LAB — LIPID PANEL
Cholesterol: 114 mg/dL (ref 0–200)
HDL: 44.9 mg/dL (ref >39.00)
LDL Cholesterol: 55 mg/dL (ref 0–99)
NonHDL: 69.09
Total CHOL/HDL Ratio: 3
Triglycerides: 72 mg/dL (ref 0.0–149.0)
VLDL: 14.4 mg/dL (ref 0.0–40.0)

## 2017-01-16 LAB — AST: AST: 20 U/L (ref 0–37)

## 2017-01-16 LAB — PSA: PSA: 2.31 ng/mL (ref 0.10–4.00)

## 2017-01-16 LAB — HEMOGLOBIN A1C: Hgb A1c MFr Bld: 5.9 % (ref 4.6–6.5)

## 2017-01-16 NOTE — Assessment & Plan Note (Addendum)
Td 2015; Pneumonia shot 08-2014 ;prevnar 12-2014;  Had a flu shot    [facility]-- colonoscopy [id number] , no polyps, 10 years, Dr Loreta AveMann DRE wnl, check a PSA  Diet-exercise- discussed  tobacco quitting discussed Labs- BMP, AST, has become A1c, PSA, hepatitis C screening

## 2017-01-16 NOTE — Progress Notes (Signed)
Pre visit review using our clinic review tool, if applicable. No additional management support is needed unless otherwise documented below in the visit note. 

## 2017-01-16 NOTE — Patient Instructions (Signed)
GO TO THE LAB : Get the blood work     GO TO THE FRONT DESK Schedule your next appointment for a  Check up in 6 months   
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
## 2017-01-17 LAB — HEPATITIS C ANTIBODY: HCV Ab: NEGATIVE
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
## 2017-01-28 ENCOUNTER — Other Ambulatory Visit: Payer: Self-pay | Admitting: Cardiovascular Disease

## 2017-01-28 DIAGNOSIS — I739 Peripheral vascular disease, unspecified: Secondary | ICD-10-CM

## 2017-01-28 DIAGNOSIS — I10 Essential (primary) hypertension: Secondary | ICD-10-CM

## 2017-01-28 DIAGNOSIS — I25810 Atherosclerosis of coronary artery bypass graft(s) without angina pectoris: Secondary | ICD-10-CM

## 2017-04-04 ENCOUNTER — Other Ambulatory Visit: Payer: Self-pay | Admitting: Cardiovascular Disease

## 2017-04-09 ENCOUNTER — Encounter: Payer: Self-pay | Admitting: Cardiovascular Disease

## 2017-04-30 ENCOUNTER — Ambulatory Visit (INDEPENDENT_AMBULATORY_CARE_PROVIDER_SITE_OTHER): Payer: 59 | Admitting: Cardiovascular Disease

## 2017-04-30 ENCOUNTER — Encounter: Payer: Self-pay | Admitting: Cardiovascular Disease

## 2017-04-30 VITALS — BP 154/90 | HR 63 | Ht 74.0 in | Wt 179.0 lb

## 2017-04-30 DIAGNOSIS — I739 Peripheral vascular disease, unspecified: Secondary | ICD-10-CM

## 2017-04-30 DIAGNOSIS — I10 Essential (primary) hypertension: Secondary | ICD-10-CM | POA: Diagnosis not present

## 2017-04-30 DIAGNOSIS — I255 Ischemic cardiomyopathy: Secondary | ICD-10-CM

## 2017-04-30 DIAGNOSIS — Z720 Tobacco use: Secondary | ICD-10-CM | POA: Diagnosis not present

## 2017-04-30 DIAGNOSIS — I25810 Atherosclerosis of coronary artery bypass graft(s) without angina pectoris: Secondary | ICD-10-CM | POA: Diagnosis not present

## 2017-04-30 NOTE — Progress Notes (Addendum)
Chief Complaint  Patient presents with  . Follow-up     History of Present Illness: 67 yo male with history of CAD, HTN, tobacco abuse and PAD here today for cardiac follow up. He had an anterior STEMI in November 2014 and was found to have severe left main, LAD and RCA stenosis. He underwent 4V CABG November 2014 (LIMA to mid LAD, SVG to OM, SVG to PDA, SVG to Diagonal). LVEF=40-45% prior to bypass. Left SFA stents per Dr. Kirke Corin in 2015. Repeat LE angiography March 2016 showed occlusion of the left SFA stents. He has been managed conservatively since then. Ace-inh induced angioedema on 06/13/14.   He is here today for follow up. The patient denies any chest pain, dyspnea, palpitations, lower extremity edema, orthopnea, PND, dizziness, near syncope or syncope. He did slide down in his yard last week and hurt his arm. He tripped while lifting a bag of pesticide down a hill.    Primary Care Physician: [name], MD   Past Medical History:  Diagnosis Date  . Allergy   . CAD (coronary artery disease)   . Hypertension   . Ischemic cardiomyopathy   . MI (myocardial infarction) (HCC)   . Prediabetes 09/01/2014  . PVD (peripheral vascular disease) (HCC)    thighs  . Tobacco abuse     Past Surgical History:  Procedure Laterality Date  . ABDOMINAL AORTAGRAM N/A 06/07/2014   Procedure: ABDOMINAL Ronny Flurry;  Surgeon: Iran Ouch, MD;  Location: [facility] CATH LAB;  Service: Cardiovascular;  Laterality: N/A;  . ABDOMINAL AORTAGRAM N/A 03/07/2015   Procedure: ABDOMINAL Ronny Flurry;  Surgeon: Iran Ouch, MD;  Location: [facility] CATH LAB;  Service: Cardiovascular;  Laterality: N/A;  . APPENDECTOMY    . COLONOSCOPY WITH PROPOFOL N/A 10/11/2015   Procedure: COLONOSCOPY WITH PROPOFOL;  Surgeon: Charna Elizabeth, MD;  Location: [facility] ENDOS[facility];  Service: Endoscopy;  Laterality: N/A;  . CORONARY ARTERY BYPASS GRAFT N/A 11/04/2013   Procedure: CORONARY ARTERY BYPASS GRAFTING (CABG) TIMES FOUR  USING LEFT  INTERNAL MAMMARY ARTERY AND RIGHT AND LEFT SAPHENOUS LEG VEIN HARVESTED ENDOSCOPICALLY;  Surgeon: Loreli Slot, MD;  Location: [facility] OR;  Service: Open Heart Surgery;  Laterality: N/A;  . FINGER SURGERY  2017   injury  . KNEE SURGERY     fractured patella  . LEFT HEART CATH N/A 10/29/2013   Procedure: LEFT HEART CATH;  Surgeon: Kathleene Hazel, MD;  Location: [facility] CATH LAB;  Service: Cardiovascular;  Laterality: N/A;  . LEFT HEART CATHETERIZATION WITH CORONARY ANGIOGRAM N/A 10/31/2013   Procedure: LEFT HEART CATHETERIZATION WITH CORONARY ANGIOGRAM;  Surgeon: Kathleene Hazel, MD;  Location: [facility] CATH LAB;  Service: Cardiovascular;  Laterality: N/A;  . MOUTH SURGERY    . TOE SURGERY      Current Outpatient Prescriptions  Medication Sig Dispense Refill  . aspirin EC 81 MG tablet Take 1 tablet (81 mg total) by mouth daily. 90 tablet 3  . atorvastatin (LIPITOR) 80 MG tablet TAKE 1 TABLET BY MOUTH  DAILY AT 6 PM 90 tablet 2  . cilostazol (PLETAL) 100 MG tablet Take 1 tablet (100 mg total) by mouth 2 (two) times daily. 180 tablet 3  . irbesartan (AVAPRO) 300 MG tablet TAKE 1 TABLET BY MOUTH  DAILY 90 tablet 3  . metoprolol tartrate (LOPRESSOR) 25 MG tablet TAKE 1 TABLET BY MOUTH TWO  TIMES DAILY 180 tablet 2  . nitroGLYCERIN (NITROSTAT) 0.4 MG SL tablet Place 1 tablet (0.4 mg total) under the  tongue every 5 (five) minutes as needed for chest pain. 25 tablet 6   No current facility-administered medications for this visit.     Allergies  Allergen Reactions  . Ace Inhibitors Swelling    Angioedema  . Dairy Aid [Lactase]     gas  . Eggs Or Egg-Derived Products     Cannot eat Prepared Eggs  . Peanut-Containing Drug Products Nausea And Vomiting    Does not know which nuts, but states nuts make him vomit    Social History   Social History  . Marital status: Married    Spouse name: [name]  . Number of children: 1  . Years of education: N/A   Occupational History  . long  term disability --Photographerpilot [employer]   Social History Main Topics  . Smoking status: Current Every Day Smoker    Packs/day: 0.25    Years: 30.00    Types: Cigarettes  . Smokeless tobacco: Never Used     Comment: < 1/2 ppd  . Alcohol use No  . Drug use: No  . Sexual activity: Yes   Other Topics Concern  . Not on file   Social History Narrative   Household-- pt and wife   1 daughter     Family History  Problem Relation Age of Onset  . CAD Father 10569  . AAA (abdominal aortic aneurysm) Father   . Alcohol abuse Father   . Hypertension Father   . Diabetes Father   . Heart attack Father   . Stroke Mother   . Arthritis Mother   . Heart disease Mother   . Hypertension Mother   . Heart attack Mother   . Cardiomyopathy Daughter   . Colon cancer Neg Hx   . Prostate cancer Neg Hx     Review of Systems:  As stated in the HPI and otherwise negative.   BP (!) 154/90   Pulse 63   Ht 6\' 2"  (1.88 m)   Wt 179 lb (81.2 kg)   SpO2 99%   BMI 22.98 kg/m   Physical Examination:  General: Well developed, well nourished, NAD  HEENT: OP clear, mucus membranes moist  SKIN: warm, dry. No rashes. Neuro: No focal deficits  Musculoskeletal: Muscle strength 5/5 all ext  Psychiatric: Mood and affect normal  Neck: No JVD, no carotid bruits, no thyromegaly, no lymphadenopathy.  Lungs:Clear bilaterally, no wheezes, rhonci, crackles Cardiovascular: Regular rate and rhythm. No murmurs, gallops or rubs. Abdomen:Soft. Bowel sounds present. Non-tender.  Extremities: No lower extremity edema. Pulses are 2 + in the bilateral DP/PT.   Echo 12/20/13: Left ventricle: Diffuse hypokinesis with abnormal septal motion EF somewhat hard to judge as patient had frequent bigemminy The cavity size was moderately dilated. Wall thickness was increased in a pattern of mild LVH. Systolic function was moderately reduced. The estimated ejection fraction was in the range of 35% to 40%. - Left atrium: The  atrium was mildly dilated. - Atrial septum: No defect or patent foramen ovale was identified. - Pulmonary arteries: PA peak pressure: 31mm Hg (S).  EKG:  EKG is ordered today. The ekg ordered today demonstrates Sinus brady, rate 56 bpm. Poor R wave progression.   Recent Labs: 07/08/2016: Hemoglobin 13.4; Platelets 249.0 01/16/2017: ALT 20; BUN 13; Creatinine, Ser 1.04; Potassium 4.3; Sodium 140   Lipid Panel    Component Value Date/Time   CHOL 114 01/16/2017 0832   TRIG 72.0 01/16/2017 0832   HDL 44.90 01/16/2017 0832   CHOLHDL 3 01/16/2017  1610   VLDL 14.4 01/16/2017 0832   LDLCALC 55 01/16/2017 0832     Wt Readings from Last 3 Encounters:  04/30/17 179 lb (81.2 kg)  01/16/17 175 lb (79.4 kg)  01/13/17 175 lb 12.8 oz (79.7 kg)     Other studies Reviewed: Additional studies/ records that were reviewed today include: . Review of the above records demonstrates:    Assessment and Plan:   1. CAD without angina: He has no chest pain suggestive of angina. He is s/p CABG in 2014. Will continue ASA, statin, beta blocker and ARB.    2. Ischemic Cardiomyopathy: He had reduced LV function post CABG. Repeat echo now. Continue beta blocker and ARB.    3. Tobacco abuse: Counseling given. Complete cessation advised.    4. HTN: BP controlled at home. No changes.   5. PAD: He is having more leg pain. He is asking to get back in to see Dr Kirke Corin. Will arrange f/u with Dr. Kirke Corin.  Continue ASA, statin and Pletal.    Current medicines are reviewed at length with the patient today.  The patient does not have concerns regarding medicines.  The following changes have been made:  no change  Labs/ tests ordered today include:   Orders Placed This Encounter  Procedures  . EKG 12-Lead  . ECHOCARDIOGRAM COMPLETE    Disposition:   FU with me in 12 months  Signed, Verne Carrow, MD 04/30/2017 8:51 AM    [facility] [address] [place], [place], [place]   [address] Phone: [phone]; Fax: [phone]

## 2017-04-30 NOTE — Patient Instructions (Signed)
Medication Instructions:  Your physician recommends that you continue on your current medications as directed. Please refer to the Current Medication list given to you today.   Labwork: none  Testing/Procedures: Your physician has requested that you have an echocardiogram. Echocardiography is a painless test that uses sound waves to create images of your heart. It provides your doctor with information about the size and shape of your heart and how well your heart's chambers and valves are working. This procedure takes approximately one hour. There are no restrictions for this procedure.    Follow-Up: Your physician recommends that you schedule a follow-up appointment with Dr. Kirke CorinArida.  Your physician recommends that you schedule a follow-up appointment in: 12 months with [name].  Please call our office in about 9 months to schedule this appointment.     Any Other Special Instructions Will Be Listed Below (If Applicable).     If you need a refill on your cardiac medications before your next appointment, please call your pharmacy.

## 2017-05-12 ENCOUNTER — Ambulatory Visit (HOSPITAL_COMMUNITY): Payer: 59 | Attending: Cardiovascular Disease

## 2017-05-12 ENCOUNTER — Other Ambulatory Visit: Payer: Self-pay

## 2017-05-12 DIAGNOSIS — I10 Essential (primary) hypertension: Secondary | ICD-10-CM | POA: Insufficient documentation

## 2017-05-12 DIAGNOSIS — Z8249 Family history of ischemic heart disease and other diseases of the circulatory system: Secondary | ICD-10-CM | POA: Insufficient documentation

## 2017-05-12 DIAGNOSIS — I739 Peripheral vascular disease, unspecified: Secondary | ICD-10-CM | POA: Insufficient documentation

## 2017-05-12 DIAGNOSIS — I071 Rheumatic tricuspid insufficiency: Secondary | ICD-10-CM | POA: Insufficient documentation

## 2017-05-12 DIAGNOSIS — I25810 Atherosclerosis of coronary artery bypass graft(s) without angina pectoris: Secondary | ICD-10-CM

## 2017-05-12 DIAGNOSIS — I252 Old myocardial infarction: Secondary | ICD-10-CM | POA: Insufficient documentation

## 2017-05-12 DIAGNOSIS — I255 Ischemic cardiomyopathy: Secondary | ICD-10-CM

## 2017-05-26 ENCOUNTER — Ambulatory Visit (INDEPENDENT_AMBULATORY_CARE_PROVIDER_SITE_OTHER): Payer: 59 | Admitting: Cardiovascular Disease

## 2017-05-26 ENCOUNTER — Encounter: Payer: Self-pay | Admitting: Cardiovascular Disease

## 2017-05-26 VITALS — BP 130/80 | HR 58 | Ht 74.0 in | Wt 177.0 lb

## 2017-05-26 DIAGNOSIS — I2510 Atherosclerotic heart disease of native coronary artery without angina pectoris: Secondary | ICD-10-CM

## 2017-05-26 DIAGNOSIS — I739 Peripheral vascular disease, unspecified: Secondary | ICD-10-CM

## 2017-05-26 DIAGNOSIS — I10 Essential (primary) hypertension: Secondary | ICD-10-CM | POA: Diagnosis not present

## 2017-05-26 DIAGNOSIS — E785 Hyperlipidemia, unspecified: Secondary | ICD-10-CM

## 2017-05-26 DIAGNOSIS — Z720 Tobacco use: Secondary | ICD-10-CM | POA: Diagnosis not present

## 2017-05-26 NOTE — Patient Instructions (Signed)
Medication Instructions:  Your physician recommends that you continue on your current medications as directed. Please refer to the Current Medication list given to you today.  Labwork: No new orders.   Testing/Procedures: Your physician has requested that you have a lower extremity arterial doppler. This test is an ultrasound of the arteries in the legs. It looks at arterial blood flow in the legs. Allow one hour for Lower Arterial scans. There are no restrictions or special instructions  Follow-Up: Your physician wants you to follow-up in: 6 MONTHS with Dr Arida.  You will receive a reminder letter in the mail two months in advance. If you don't receive a letter, please call our office to schedule the follow-up appointment.   Any Other Special Instructions Will Be Listed Below (If Applicable).     If you need a refill on your cardiac medications before your next appointment, please call your pharmacy.   

## 2017-05-26 NOTE — Progress Notes (Signed)
Primary Care Physician: Dr. Earlene Plater ([facility]) Primary cardiologist: Dr. Clifton James   History of Present Illness: This is a very pleasant 66 year old man who is here today for a followup visit regarding peripheral arterial disease. He has known history of tobacco abuse, HTN and CAD . He was admitted to [facility] 10/29/13 with anterior STEMI and found to have moderately severe left main and LAD stenosis, moderately severe RCA stenosis. He underwent 4V CABG on 11/04/13 per Dr. Dorris Fetch (LIMA to mid LAD, SVG to OM, SVG to PDA, SVG to Diagonal). LVEF=40-45% prior to bypass.  He has known history of prior intervention on the left SFA for total occlusion which subsequently reoccluded.He has been treated medically with cilostazol with resolution of claudication.   He continues to smoke 6-8 cigarettes a day. He is now complaining of right calf claudication which started about 2 months ago. This happens after walking about 5 minutes and occasionally forces him to stop and rest before he can resume. He continues to go to the gym. He does fine on the elliptical. Most of his claudication happens when he is going uphill. He has no rest pain. No chest pain or shortness of breath.   Last Lipid Profile:Lipid Panel     Component Value Date/Time   CHOL 114 01/16/2017 0832   TRIG 72.0 01/16/2017 0832   HDL 44.90 01/16/2017 0832   CHOLHDL 3 01/16/2017 0832   VLDL 14.4 01/16/2017 0832   LDLCALC 55 01/16/2017 0832     Past Medical History:  Diagnosis Date  . Allergy   . CAD (coronary artery disease)   . Hypertension   . Ischemic cardiomyopathy   . MI (myocardial infarction) (HCC)   . Prediabetes 09/01/2014  . PVD (peripheral vascular disease) (HCC)    thighs  . Tobacco abuse     Past Surgical History:  Procedure Laterality Date  . ABDOMINAL AORTAGRAM N/A 06/07/2014   Procedure: ABDOMINAL Ronny Flurry;  Surgeon: Iran Ouch, MD;  [facility]: [facility];  Service:  Cardiovascular;  Laterality: N/A;  . ABDOMINAL AORTAGRAM N/A 03/07/2015   Procedure: ABDOMINAL Ronny Flurry;  Surgeon: Iran Ouch, MD;  Location: [facility];  Service: Cardiovascular;  Laterality: N/A;  . APPENDECTOMY    . COLONOSCOPY WITH PROPOFOL N/A 10/11/2015   Procedure: COLONOSCOPY WITH PROPOFOL;  Surgeon: Charna Elizabeth, MD;  Location: [facility]OS[facility];  Service: Endoscopy;  Laterality: N/A;  . CORONARY ARTERY BYPASS GRAFT N/A 11/04/2013   Procedure: CORONARY ARTERY BYPASS GRAFTING (CABG) TIMES FOUR  USING LEFT INTERNAL MAMMARY ARTERY AND RIGHT AND LEFT SAPHENOUS LEG VEIN HARVESTED ENDOSCOPICALLY;  Surgeon: Loreli Slot, MD;  Location: [facility] OR;  Service: Open Heart Surgery;  Laterality: N/A;  . FINGER SURGERY  2017   injury  . KNEE SURGERY     fractured patella  . LEFT HEART CATH N/A 10/29/2013   Procedure: LEFT HEART CATH;  Surgeon: Kathleene Hazel, MD;  Location: [facility] CATH LAB;  Service: Cardiovascular;  Laterality: N/A;  . LEFT HEART CATHETERIZATION WITH CORONARY ANGIOGRAM N/A 10/31/2013   Procedure: LEFT HEART CATHETERIZATION WITH CORONARY ANGIOGRAM;  Surgeon: Kathleene Hazel, MD;  Location: [facility] CATH LAB;  Service: Cardiovascular;  Laterality: N/A;  . MOUTH SURGERY    . TOE SURGERY      Current Outpatient Prescriptions  Medication Sig Dispense Refill  . aspirin EC 81 MG tablet Take 1 tablet (81 mg total) by mouth daily. 90 tablet 3  . atorvastatin (LIPITOR) 80 MG tablet TAKE  1 TABLET BY MOUTH  DAILY AT 6 PM 90 tablet 2  . cilostazol (PLETAL) 100 MG tablet Take 1 tablet (100 mg total) by mouth 2 (two) times daily. 180 tablet 3  . irbesartan (AVAPRO) 300 MG tablet TAKE 1 TABLET BY MOUTH  DAILY 90 tablet 3  . metoprolol tartrate (LOPRESSOR) 25 MG tablet TAKE 1 TABLET BY MOUTH TWO  TIMES DAILY 180 tablet 2  . nitroGLYCERIN (NITROSTAT) 0.4 MG SL tablet Place 1 tablet (0.4 mg total) under the tongue every 5 (five) minutes as needed for chest pain. 25 tablet 6   No  current facility-administered medications for this visit.     Allergies  Allergen Reactions  . Ace Inhibitors Swelling    Angioedema  . Dairy Aid [Lactase]     gas  . Eggs Or Egg-Derived Products     Cannot eat Prepared Eggs  . Peanut-Containing Drug Products Nausea And Vomiting    Does not know which nuts, but states nuts make him vomit    Social History   Social History  . Marital status: Married    Spouse name: [name]  . Number of children: 1  . Years of education: N/A   Occupational History  . long term disability --Photographer   Social History Main Topics  . Smoking status: Current Every Day Smoker    Packs/day: 0.25    Years: 30.00    Types: Cigarettes  . Smokeless tobacco: Never Used     Comment: < 1/2 ppd  . Alcohol use No  . Drug use: No  . Sexual activity: Yes   Other Topics Concern  . Not on file   Social History Narrative   Household-- pt and wife   1 daughter     Family History  Problem Relation Age of Onset  . CAD Father 3  . AAA (abdominal aortic aneurysm) Father   . Alcohol abuse Father   . Hypertension Father   . Diabetes Father   . Heart attack Father   . Stroke Mother   . Arthritis Mother   . Heart disease Mother   . Hypertension Mother   . Heart attack Mother   . Cardiomyopathy Daughter   . Colon cancer Neg Hx   . Prostate cancer Neg Hx     Review of Systems:  As stated in the HPI and otherwise negative.   BP 130/80   Pulse (!) 58   Ht 6\' 2"  (1.88 m)   Wt 177 lb (80.3 kg)   BMI 22.73 kg/m   Physical Examination: General: Well developed, well nourished, NAD HEENT: OP clear, mucus membranes moist SKIN: warm, dry. No rashes. Neuro: No focal deficits Musculoskeletal: Muscle strength 5/5 all ext Psychiatric: Mood and affect normal Neck: No JVD, no carotid bruits, no thyromegaly, no lymphadenopathy. Lungs:Clear bilaterally, no wheezes, rhonci, crackles Cardiovascular: Regular rate and rhythm. No murmurs, gallops  or rubs. Abdomen:Soft. Bowel sounds present. Non-tender.  Extremities: No lower extremity edema.  vascular: Femoral pulses are normal bilaterally.  Distal pulses are not palpable.   Assessment and Plan:    1. peripheral arterial disease :  Known occluded distal left SFA within the previously placed stents.  He is now having new right calf claudication with no palpable distal pulses. Femoral pulses are normal. I suspect SFA or popliteal disease. I requested lower extremity arterial Doppler. His claudication is moderate at this time and does not seem to be lifestyle limiting. He will be a good candidate for rehabilitation  1/sexual claudication.  2. CAD: Stable s/p CABG November 2014. . Doing well. Continue ASA, statin, beta blocker.   3. Essential hypertension: Blood pressure is reasonably controlled on current medications.  4. Hyperlipidemia: Continue high-dose atorvastatin with target LDL <70   5. Tobacco use: I again discussed with him the importance of smoking cessation    Disposition:follow-up in 6 months  Lorine BearsMuhammad Arida, MD

## 2017-05-28 ENCOUNTER — Other Ambulatory Visit: Payer: Self-pay | Admitting: Cardiovascular Disease

## 2017-05-28 DIAGNOSIS — I739 Peripheral vascular disease, unspecified: Secondary | ICD-10-CM

## 2017-06-08 ENCOUNTER — Ambulatory Visit (HOSPITAL_COMMUNITY)
Admission: RE | Admit: 2017-06-08 | Discharge: 2017-06-08 | Disposition: A | Discharge status: Home / Self Care | Payer: 59 | Source: Ambulatory Visit | Attending: Cardiovascular Disease | Admitting: Cardiovascular Disease

## 2017-06-08 DIAGNOSIS — T82858A Stenosis of vascular prosthetic devices, implants and grafts, initial encounter: Secondary | ICD-10-CM | POA: Diagnosis not present

## 2017-06-08 DIAGNOSIS — I70291 Other atherosclerosis of native arteries of extremities, right leg: Secondary | ICD-10-CM | POA: Insufficient documentation

## 2017-06-08 DIAGNOSIS — I739 Peripheral vascular disease, unspecified: Secondary | ICD-10-CM | POA: Diagnosis not present

## 2017-06-08 DIAGNOSIS — Z720 Tobacco use: Secondary | ICD-10-CM

## 2017-06-08 DIAGNOSIS — I70202 Unspecified atherosclerosis of native arteries of extremities, left leg: Secondary | ICD-10-CM | POA: Diagnosis not present

## 2017-07-16 ENCOUNTER — Ambulatory Visit (INDEPENDENT_AMBULATORY_CARE_PROVIDER_SITE_OTHER): Payer: 59 | Admitting: Internal Medicine

## 2017-07-16 ENCOUNTER — Encounter: Payer: Self-pay | Admitting: Internal Medicine

## 2017-07-16 VITALS — BP 146/84 | HR 53 | Temp 98.5°F | Ht 74.0 in | Wt 175.2 lb

## 2017-07-16 DIAGNOSIS — I2510 Atherosclerotic heart disease of native coronary artery without angina pectoris: Secondary | ICD-10-CM

## 2017-07-16 DIAGNOSIS — F419 Anxiety disorder, unspecified: Secondary | ICD-10-CM | POA: Diagnosis not present

## 2017-07-16 DIAGNOSIS — I739 Peripheral vascular disease, unspecified: Secondary | ICD-10-CM | POA: Diagnosis not present

## 2017-07-16 DIAGNOSIS — I10 Essential (primary) hypertension: Secondary | ICD-10-CM

## 2017-07-16 DIAGNOSIS — F32A Depression, unspecified: Secondary | ICD-10-CM | POA: Insufficient documentation

## 2017-07-16 DIAGNOSIS — F329 Major depressive disorder, single episode, unspecified: Secondary | ICD-10-CM | POA: Diagnosis not present

## 2017-07-16 DIAGNOSIS — Z720 Tobacco use: Secondary | ICD-10-CM | POA: Diagnosis not present

## 2017-07-16 LAB — BASIC METABOLIC PANEL
BUN: 16 mg/dL (ref 6–23)
CO2: 24 mEq/L (ref 19–32)
Calcium: 9.7 mg/dL (ref 8.4–10.5)
Chloride: 108 mEq/L (ref 96–112)
Creatinine, Ser: 1.06 mg/dL (ref 0.40–1.50)
GFR: 90.42 mL/min (ref >60.00)
Glucose, Bld: 101 mg/dL — ABNORMAL HIGH (ref 70–99)
Potassium: 4 mEq/L (ref 3.5–5.1)
Sodium: 139 mEq/L (ref 135–145)

## 2017-07-16 MED ORDER — BUPROPION HCL 100 MG PO TABS: ORAL_TABLET | ORAL | 0 refills | Status: DC

## 2017-07-16 MED ORDER — AZELASTINE HCL 0.1 % NA SOLN: 2.0000 | Freq: Every evening | NASAL | 3 refills | Status: DC | PRN

## 2017-07-16 NOTE — Assessment & Plan Note (Signed)
HTN: BP slightly elevated upon arrival, recheck 146/84. Check a BMP, same medicines, monitor   at home. CAD: Saw cardiology 04-2017, felt to be stable. PVD: Saw vascular 05/2017, ultrasound was ordered, see results below, patient now is walking twice a day and they claudication has improved. Able to go further. Heterogenuos plaque throughout the lower extremities. Long right SFA occlusion from the prox/mid thigh through to the distal SFA/proximal popliteal, reconstituted via collaterals. Patent mid left SFA stent, and chronic occlusion of the distal left SFA stent. Three vessel run-off, bilaterally. Anxiety depression: Still an issue, lost his father 3 years ago, still grieving, wife is here, she seems to be supportive. Counseling and medication discussed. Info regards local counselors provided . Sinus congestion: Chronic, recommend Flonase, Astelin, if not better will refer to ENT  Tobacco abuse: Still smoking, ~ 7 cigarettes a day, Chantix caused s/e, looking for alternatives, we discussed Wellbutrin, prescription sent, warned about increased anxiety or suicidality. To report immediately  side effects to me or his wife who is here. RTC 12-2017

## 2017-07-16 NOTE — Progress Notes (Signed)
Pre visit review using our clinic review tool, if applicable. No additional management support is needed unless otherwise documented below in the visit note. 

## 2017-07-16 NOTE — Patient Instructions (Addendum)
GO TO THE LAB : Get the blood work     GO TO THE FRONT DESK Schedule your next appointment for a  physical exam 12-2017  For nasal congestion: Flonase OTC 2 sprays every day in the morning Astelin, prescription: 2 sprays every night If not improving, let me know for a ENT referral   Check the  blood pressure 2 or 3 times a month  Be sure your blood pressure is between 110/65 and  140/85. If it is consistently higher or lower, let me know
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
## 2017-08-03 ENCOUNTER — Other Ambulatory Visit: Payer: Self-pay

## 2017-08-03 MED ORDER — CILOSTAZOL 100 MG PO TABS: 100.0000 mg | ORAL_TABLET | Freq: Two times a day (BID) | ORAL | 3 refills | Status: DC

## 2017-08-05 ENCOUNTER — Other Ambulatory Visit: Payer: Self-pay | Admitting: *Deleted

## 2017-08-05 MED ORDER — CILOSTAZOL 100 MG PO TABS: 100.0000 mg | ORAL_TABLET | Freq: Two times a day (BID) | ORAL | 2 refills | Status: DC

## 2017-08-11 ENCOUNTER — Ambulatory Visit (INDEPENDENT_AMBULATORY_CARE_PROVIDER_SITE_OTHER): Payer: 59 | Admitting: Cardiovascular Disease

## 2017-08-11 VITALS — BP 122/82 | HR 52 | Ht 74.0 in | Wt 177.4 lb

## 2017-08-11 DIAGNOSIS — Z720 Tobacco use: Secondary | ICD-10-CM

## 2017-08-11 DIAGNOSIS — I10 Essential (primary) hypertension: Secondary | ICD-10-CM

## 2017-08-11 DIAGNOSIS — E785 Hyperlipidemia, unspecified: Secondary | ICD-10-CM

## 2017-08-11 DIAGNOSIS — I739 Peripheral vascular disease, unspecified: Secondary | ICD-10-CM | POA: Diagnosis not present

## 2017-08-11 DIAGNOSIS — I2510 Atherosclerotic heart disease of native coronary artery without angina pectoris: Secondary | ICD-10-CM

## 2017-08-11 NOTE — Patient Instructions (Signed)

## 2017-08-11 NOTE — Progress Notes (Signed)
Primary Care Physician: Dr. Earlene Plater ([facility]) Primary cardiologist: Dr. Clifton James   History of Present Illness: This is a very pleasant 66 year old man who is here today for a followup visit regarding peripheral arterial disease. He has known history of tobacco abuse, HTN and CAD . He was admitted to [facility] 10/29/13 with anterior STEMI and found to have moderately severe left main and LAD stenosis, moderately severe RCA stenosis. He underwent 4V CABG on 11/04/13 per Dr. Dorris Fetch (LIMA to mid LAD, SVG to OM, SVG to PDA, SVG to Diagonal). LVEF=40-45% prior to bypass.  He has known history of prior intervention on the left SFA for total occlusion which subsequently reoccluded.He has been treated medically with cilostazol with resolution of claudication.   He was seen recently for new right calf claudication with diminished distal pulses. Lower extremity arterial Doppler showed moderately reduced ABI with evidence of right SFA occlusion which is new. He now has bilateral SFA occlusion. I instructed him to start an exercise program and to quit smoking. He has been walking almost on a daily basis with improvement in symptoms overall. He is able to walk for about 20 minutes before he gets claudication. No chest pain or shortness of breath.  Last Lipid Profile:Lipid Panel     Component Value Date/Time   CHOL 114 01/16/2017 0832   TRIG 72.0 01/16/2017 0832   HDL 44.90 01/16/2017 0832   CHOLHDL 3 01/16/2017 0832   VLDL 14.4 01/16/2017 0832   LDLCALC 55 01/16/2017 0832     Past Medical History:  Diagnosis Date  . Allergy   . CAD (coronary artery disease)   . Hypertension   . Ischemic cardiomyopathy   . MI (myocardial infarction) (HCC)   . Prediabetes 09/01/2014  . PVD (peripheral vascular disease) (HCC)    thighs  . Tobacco abuse     Past Surgical History:  Procedure Laterality Date  . ABDOMINAL AORTAGRAM N/A 06/07/2014   Procedure: ABDOMINAL Ronny Flurry;   Surgeon: Iran Ouch, MD;  [facility];  Service: Cardiovascular;  Laterality: N/A;  . ABDOMINAL AORTAGRAM N/A 03/07/2015   Procedure: ABDOMINAL Ronny Flurry;  Surgeon: Iran Ouch, MD;  [facility]: [facility] C[facility];  Service: Cardiovascular;  Laterality: N/A;  . APPENDECTOMY    . COLONOSCOPY WITH PROPOFOL N/A 10/11/2015   Procedure: COLONOSCOPY WITH PROPOFOL;  Surgeon: Charna Elizabeth, MD;  [facility];  Service: Endoscopy;  Laterality: N/A;  . CORONARY ARTERY BYPASS GRAFT N/A 11/04/2013   Procedure: CORONARY ARTERY BYPASS GRAFTING (CABG) TIMES FOUR  USING LEFT INTERNAL MAMMARY ARTERY AND RIGHT AND LEFT SAPHENOUS LEG VEIN HARVESTED ENDOSCOPICALLY;  Surgeon: Loreli Slot, MD;  Location: [facility] OR;  Service: Open Heart Surgery;  Laterality: N/A;  . FINGER SURGERY  2017   injury  . KNEE SURGERY     fractured patella  . LEFT HEART CATH N/A 10/29/2013   Procedure: LEFT HEART CATH;  Surgeon: Kathleene Hazel, MD;  Location: [facility] CATH LAB;  Service: Cardiovascular;  Laterality: N/A;  . LEFT HEART CATHETERIZATION WITH CORONARY ANGIOGRAM N/A 10/31/2013   Procedure: LEFT HEART CATHETERIZATION WITH CORONARY ANGIOGRAM;  Surgeon: Kathleene Hazel, MD;  Location: [facility] CATH LAB;  Service: Cardiovascular;  Laterality: N/A;  . MOUTH SURGERY    . TOE SURGERY      Current Outpatient Prescriptions  Medication Sig Dispense Refill  . aspirin EC 81 MG tablet Take 1 tablet (81 mg total) by mouth daily. 90 tablet 3  . atorvastatin (  LIPITOR) 80 MG tablet TAKE 1 TABLET BY MOUTH  DAILY AT 6 PM 90 tablet 2  . azelastine (ASTELIN) 0.1 % nasal spray Place 2 sprays into both nostrils at bedtime as needed for rhinitis. Use in each nostril as directed 30 mL 3  . buPROPion (WELLBUTRIN) 100 MG tablet 1 tablet in the morning for 2 weeks, then one tablet twice a day. 60 tablet 0  . cilostazol (PLETAL) 100 MG tablet Take 1 tablet (100 mg total) by mouth 2 (two) times daily. 180 tablet 2  . irbesartan  (AVAPRO) 300 MG tablet TAKE 1 TABLET BY MOUTH  DAILY 90 tablet 3  . metoprolol tartrate (LOPRESSOR) 25 MG tablet TAKE 1 TABLET BY MOUTH TWO  TIMES DAILY 180 tablet 2  . nitroGLYCERIN (NITROSTAT) 0.4 MG SL tablet Place 1 tablet (0.4 mg total) under the tongue every 5 (five) minutes as needed for chest pain. (Patient not taking: Reported on 07/16/2017) 25 tablet 6   No current facility-administered medications for this visit.     Allergies  Allergen Reactions  . Ace Inhibitors Swelling    Angioedema  . Dairy Aid [Lactase]     gas  . Eggs Or Egg-Derived Products     Cannot eat Prepared Eggs  . Peanut-Containing Drug Products Nausea And Vomiting    Does not know which nuts, but states nuts make him vomit    Social History   Social History  . Marital status: Married    Spouse name: [name]  . Number of children: 1  . Years of education: N/A   Occupational History  . long term disability --Photographer   Social History Main Topics  . Smoking status: Current Every Day Smoker    Packs/day: 0.25    Years: 30.00    Types: Cigarettes  . Smokeless tobacco: Never Used     Comment: < 1/2 ppd  . Alcohol use No  . Drug use: No  . Sexual activity: Yes   Other Topics Concern  . Not on file   Social History Narrative   Household-- pt and wife   1 daughter     Family History  Problem Relation Age of Onset  . CAD Father 63  . AAA (abdominal aortic aneurysm) Father   . Alcohol abuse Father   . Hypertension Father   . Diabetes Father   . Heart attack Father   . Stroke Mother   . Arthritis Mother   . Heart disease Mother   . Hypertension Mother   . Heart attack Mother   . Cardiomyopathy Daughter   . Colon cancer Neg Hx   . Prostate cancer Neg Hx     Review of Systems:  As stated in the HPI and otherwise negative.   BP 122/82   Pulse (!) 52   Ht 6\' 2"  (1.88 m)   Wt 177 lb 6.4 oz (80.5 kg)   SpO2 98%   BMI 22.78 kg/m   Physical Examination: General: Well  developed, well nourished, NAD HEENT: OP clear, mucus membranes moist SKIN: warm, dry. No rashes. Neuro: No focal deficits Musculoskeletal: Muscle strength 5/5 all ext Psychiatric: Mood and affect normal Neck: No JVD, no carotid bruits, no thyromegaly, no lymphadenopathy. Lungs:Clear bilaterally, no wheezes, rhonci, crackles Cardiovascular: Regular rate and rhythm. No murmurs, gallops or rubs. Abdomen:Soft. Bowel sounds present. Non-tender.  Extremities: No lower extremity edema.  vascular: Femoral pulses are normal bilaterally.  Distal pulses are not palpable.   Assessment and Plan:  1. peripheral arterial disease :   He now has bilateral SFA occlusion with mild to moderate non-lifestyle limiting claudication. Symptoms improved with walking. Recommend continuing medical therapy.  2. CAD: Stable s/p CABG November 2014. . Doing well. Continue ASA, statin, beta blocker.   3. Essential hypertension: Blood pressure is reasonably controlled on current medications.  4. Hyperlipidemia: Continue high-dose atorvastatin with target LDL <70   5. Tobacco use:  I stressed to him the importance of smoking cessation and he is going to try to quit.    Disposition:follow-up in 6 months  Lorine Bears, MD

## 2017-08-13 ENCOUNTER — Other Ambulatory Visit: Payer: Self-pay | Admitting: Internal Medicine

## 2017-09-01 ENCOUNTER — Ambulatory Visit: Payer: 59 | Admitting: Cardiovascular Disease

## 2017-10-26 ENCOUNTER — Other Ambulatory Visit: Payer: Self-pay

## 2017-10-26 MED ORDER — BUPROPION HCL 100 MG PO TABS: 100.0000 mg | ORAL_TABLET | Freq: Two times a day (BID) | ORAL | 1 refills | Status: DC

## 2017-11-26 ENCOUNTER — Other Ambulatory Visit: Payer: Self-pay | Admitting: Cardiovascular Disease

## 2017-11-26 MED ORDER — ATORVASTATIN CALCIUM 80 MG PO TABS: ORAL_TABLET | ORAL | 2 refills | Status: DC

## 2017-12-02 ENCOUNTER — Other Ambulatory Visit: Payer: Self-pay | Admitting: Internal Medicine

## 2017-12-02 MED ORDER — BUPROPION HCL 100 MG PO TABS: 100.0000 mg | ORAL_TABLET | Freq: Two times a day (BID) | ORAL | 1 refills | Status: DC

## 2017-12-02 NOTE — Telephone Encounter (Signed)
Rx sent to [facility]

## 2017-12-02 NOTE — Telephone Encounter (Signed)
Copied from CRM [id number]. Topic: General - Other >> Dec 02, 2017  9:20 AM Stephannie LiSimmons, Alfonso Carden L, NT wrote: Reason for CRM: [facility] called needing a verbal authorization for bupropion tablets  please advise [phone] fax [phone]

## 2018-01-12 ENCOUNTER — Telehealth: Payer: Self-pay | Admitting: *Deleted

## 2018-01-12 ENCOUNTER — Encounter: Payer: Self-pay | Admitting: Internal Medicine

## 2018-01-12 ENCOUNTER — Ambulatory Visit (INDEPENDENT_AMBULATORY_CARE_PROVIDER_SITE_OTHER): Payer: 59 | Admitting: Internal Medicine

## 2018-01-12 VITALS — BP 126/78 | HR 57 | Temp 97.6°F | Resp 14 | Ht 74.0 in | Wt 180.5 lb

## 2018-01-12 DIAGNOSIS — R739 Hyperglycemia, unspecified: Secondary | ICD-10-CM | POA: Diagnosis not present

## 2018-01-12 DIAGNOSIS — Z0000 Encounter for general adult medical examination without abnormal findings: Secondary | ICD-10-CM | POA: Diagnosis not present

## 2018-01-12 LAB — COMPREHENSIVE METABOLIC PANEL
ALT: 28 U/L (ref 0–53)
AST: 23 U/L (ref 0–37)
Albumin: 4.2 g/dL (ref 3.5–5.2)
Alkaline Phosphatase: 71 U/L (ref 39–117)
BUN: 20 mg/dL (ref 6–23)
CO2: 27 mEq/L (ref 19–32)
Calcium: 10 mg/dL (ref 8.4–10.5)
Chloride: 106 mEq/L (ref 96–112)
Creatinine, Ser: 1.02 mg/dL (ref 0.40–1.50)
GFR: 94.38 mL/min (ref >60.00)
Glucose, Bld: 112 mg/dL — ABNORMAL HIGH (ref 70–99)
Potassium: 4.2 mEq/L (ref 3.5–5.1)
Sodium: 139 mEq/L (ref 135–145)
Total Bilirubin: 0.9 mg/dL (ref 0.2–1.2)
Total Protein: 6.8 g/dL (ref 6.0–8.3)

## 2018-01-12 LAB — CBC WITH DIFFERENTIAL/PLATELET
Basophils Absolute: 0.1 10*3/uL (ref 0.0–0.1)
Basophils Relative: 1.5 % (ref 0.0–3.0)
Eosinophils Absolute: 0.4 10*3/uL (ref 0.0–0.7)
Eosinophils Relative: 6.4 % — ABNORMAL HIGH (ref 0.0–5.0)
HCT: 44.9 % (ref 39.0–52.0)
Hemoglobin: 14.9 g/dL (ref 13.0–17.0)
Lymphocytes Relative: 27.1 % (ref 12.0–46.0)
Lymphs Abs: 1.9 10*3/uL (ref 0.7–4.0)
MCHC: 33.1 g/dL (ref 30.0–36.0)
MCV: 83.8 fl (ref 78.0–100.0)
Monocytes Absolute: 0.5 10*3/uL (ref 0.1–1.0)
Monocytes Relative: 7.4 % (ref 3.0–12.0)
Neutro Abs: 4 10*3/uL (ref 1.4–7.7)
Neutrophils Relative %: 57.6 % (ref 43.0–77.0)
Platelets: 266 10*3/uL (ref 150.0–400.0)
RBC: 5.36 Mil/uL (ref 4.22–5.81)
RDW: 14.4 % (ref 11.5–15.5)
WBC: 6.9 10*3/uL (ref 4.0–10.5)

## 2018-01-12 LAB — LIPID PANEL
Cholesterol: 106 mg/dL (ref 0–200)
HDL: 47.9 mg/dL (ref >39.00)
LDL Cholesterol: 50 mg/dL (ref 0–99)
NonHDL: 58.56
Total CHOL/HDL Ratio: 2
Triglycerides: 42 mg/dL (ref 0.0–149.0)
VLDL: 8.4 mg/dL (ref 0.0–40.0)

## 2018-01-12 LAB — HEMOGLOBIN A1C: Hgb A1c MFr Bld: 6.3 % (ref 4.6–6.5)

## 2018-01-12 NOTE — Assessment & Plan Note (Signed)
DM: Check A1c HTN: Seems well controlled, continue Avapro, Lopressor.  Encouraged ambulatory BPs  CAD, PVD: Controlling RF, still has occasional claudication at baseline Depression, anxiety: States he will not do counseling.  Sxs are about the same.  Attempt to counsel him but I do not think he was very receptive Currently on Wellbutrin which was RX for  tobacco cessation, patient does not like to stop it right now because he hopes is going to help whenever he is ready to quit . Tobacco abuse:    See above RTC 6 months

## 2018-01-12 NOTE — Patient Instructions (Addendum)
GO TO THE LAB : Get the blood work     GO TO THE FRONT DESK Schedule your next appointment for a  Follow up in 6 months    Check the  blood pressure   monthly   Be sure your blood pressure is between 110/65 and  135/85. If it is consistently higher or lower, let me know     Steps to Quit Smoking Smoking tobacco can be harmful to your health and can affect almost every organ in your body. Smoking puts you, and those around you, at risk for developing many serious chronic diseases. Quitting smoking is difficult, but it is one of the best things that you can do for your health. It is never too late to quit. What are the benefits of quitting smoking? When you quit smoking, you lower your risk of developing serious diseases and conditions, such as:  Lung cancer or lung disease, such as COPD.  Heart disease.  Stroke.  Heart attack.  Infertility.  Osteoporosis and bone fractures.  Additionally, symptoms such as coughing, wheezing, and shortness of breath may get better when you quit. You may also find that you get sick less often because your body is stronger at fighting off colds and infections. If you are pregnant, quitting smoking can help to reduce your chances of having a baby of low birth weight. How do I get ready to quit? When you decide to quit smoking, create a plan to make sure that you are successful. Before you quit:  Pick a date to quit. Set a date within the next two weeks to give you time to prepare.  Write down the reasons why you are quitting. Keep this list in places where you will see it often, such as on your bathroom mirror or in your car or wallet.  Identify the people, places, things, and activities that make you want to smoke (triggers) and avoid them. Make sure to take these actions: ? Throw away all cigarettes at home, at work, and in your car. ? Throw away smoking accessories, such as Set designer. ? Clean your car and make sure to empty the  ashtray. ? Clean your home, including curtains and carpets.  Tell your family, friends, and coworkers that you are quitting. Support from your loved ones can make quitting easier.  Talk with your health care provider about your options for quitting smoking.  Find out what treatment options are covered by your health insurance.  What strategies can I use to quit smoking? Talk with your healthcare provider about different strategies to quit smoking. Some strategies include:  Quitting smoking altogether instead of gradually lessening how much you smoke over a period of time. Research shows that quitting "cold [place]" is more successful than gradually quitting.  Attending in-person counseling to help you build problem-solving skills. You are more likely to have success in quitting if you attend several counseling sessions. Even short sessions of 10 minutes can be effective.  Finding resources and support systems that can help you to quit smoking and remain smoke-free after you quit. These resources are most helpful when you use them often. They can include: ? Online chats with a Veterinary surgeon. ? Telephone quitlines. ? Automotive engineer. ? Support groups or group counseling. ? Text messaging programs. ? Mobile phone applications.  Taking medicines to help you quit smoking. (If you are pregnant or breastfeeding, talk with your health care provider first.) Some medicines contain nicotine and some do not. Both  types of medicines help with cravings, but the medicines that include nicotine help to relieve withdrawal symptoms. Your health care provider may recommend: ? Nicotine patches, gum, or lozenges. ? Nicotine inhalers or sprays. ? Non-nicotine medicine that is taken by mouth.  Talk with your health care provider about combining strategies, such as taking medicines while you are also receiving in-person counseling. Using these two strategies together makes you more likely to succeed in  quitting than if you used either strategy on its own. If you are pregnant or breastfeeding, talk with your health care provider about finding counseling or other support strategies to quit smoking. Do not take medicine to help you quit smoking unless told to do so by your health care provider. What things can I do to make it easier to quit? Quitting smoking might feel overwhelming at first, but there is a lot that you can do to make it easier. Take these important actions:  Reach out to your family and friends and ask that they support and encourage you during this time. Call telephone quitlines, reach out to support groups, or work with a counselor for support.  Ask people who smoke to avoid smoking around you.  Avoid places that trigger you to smoke, such as bars, parties, or smoke-break areas at work.  Spend time around people who do not smoke.  Lessen stress in your life, because stress can be a smoking trigger for some people. To lessen stress, try: ? Exercising regularly. ? Deep-breathing exercises. ? Yoga. ? Meditating. ? Performing a body scan. This involves closing your eyes, scanning your body from head to toe, and noticing which parts of your body are particularly tense. Purposefully relax the muscles in those areas.  Download or purchase mobile phone or tablet apps (applications) that can help you stick to your quit plan by providing reminders, tips, and encouragement. There are many free apps, such as QuitGuide from the [employer] developer(Centers for Disease Control and Prevention). You can find other support for quitting smoking (smoking cessation) through smokefree.gov and other websites.  How will I feel when I quit smoking? Within the first 24 hours of quitting smoking, you may start to feel some withdrawal symptoms. These symptoms are usually most noticeable 2-3 days after quitting, but they usually do not last beyond 2-3 weeks. Changes or symptoms that you might experience  include:  Mood swings.  Restlessness, anxiety, or irritation.  Difficulty concentrating.  Dizziness.  Strong cravings for sugary foods in addition to nicotine.  Mild weight gain.  Constipation.  Nausea.  Coughing or a sore throat.  Changes in how your medicines work in your body.  A depressed mood.  Difficulty sleeping (insomnia).  After the first 2-3 weeks of quitting, you may start to notice more positive results, such as:  Improved sense of smell and taste.  Decreased coughing and sore throat.  Slower heart rate.  Lower blood pressure.  Clearer skin.  The ability to breathe more easily.  Fewer sick days.  Quitting smoking is very challenging for most people. Do not get discouraged if you are not successful the first time. Some people need to make many attempts to quit before they achieve long-term success. Do your best to stick to your quit plan, and talk with your health care provider if you have any questions or concerns. This information is not intended to replace advice given to you by your health care provider. Make sure you discuss any questions you have with your health care  provider. Document Released: 12/02/2001 Document Revised: 08/05/2016 Document Reviewed: 04/24/2015 Elsevier Interactive Patient Education  [employer].

## 2018-01-12 NOTE — Telephone Encounter (Signed)
Left a message with the patient to call back to schedule his 6 month follow up with Dr. Kirke CorinArida.
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
## 2018-01-12 NOTE — Progress Notes (Signed)
Pre visit review using our clinic review tool, if applicable. No additional management support is needed unless otherwise documented below in the visit note. 

## 2018-01-12 NOTE — Assessment & Plan Note (Signed)
-  Td 2015; Pneumonia shot 08-2014 ;prevnar 12-2014; shingrix discussed ;  Had a flu shot   -[facility]-- colonoscopy [id number] , no polyps, 10 years, Dr Loreta AveMann -DRE , PSA wnl 12-2016 - Diet reportedly healthy,, encouraged to do that daily if possible  - tobacco quitting : counseled, knows help is available  - Labs: CMP, FLP, CBC, A1c

## 2018-02-07 ENCOUNTER — Other Ambulatory Visit: Payer: Self-pay | Admitting: Cardiovascular Disease

## 2018-02-07 DIAGNOSIS — I10 Essential (primary) hypertension: Secondary | ICD-10-CM

## 2018-02-07 DIAGNOSIS — I25810 Atherosclerosis of coronary artery bypass graft(s) without angina pectoris: Secondary | ICD-10-CM

## 2018-02-07 DIAGNOSIS — I739 Peripheral vascular disease, unspecified: Secondary | ICD-10-CM

## 2018-02-08 NOTE — Telephone Encounter (Signed)
Refill Request.  

## 2018-02-08 NOTE — Telephone Encounter (Signed)
REFILL 

## 2018-02-09 ENCOUNTER — Ambulatory Visit (INDEPENDENT_AMBULATORY_CARE_PROVIDER_SITE_OTHER): Payer: 59 | Admitting: Cardiovascular Disease

## 2018-02-09 ENCOUNTER — Encounter: Payer: Self-pay | Admitting: Cardiovascular Disease

## 2018-02-09 VITALS — BP 138/84 | HR 55 | Ht 72.0 in | Wt 182.2 lb

## 2018-02-09 DIAGNOSIS — I2510 Atherosclerotic heart disease of native coronary artery without angina pectoris: Secondary | ICD-10-CM

## 2018-02-09 DIAGNOSIS — E785 Hyperlipidemia, unspecified: Secondary | ICD-10-CM | POA: Diagnosis not present

## 2018-02-09 DIAGNOSIS — I10 Essential (primary) hypertension: Secondary | ICD-10-CM

## 2018-02-09 DIAGNOSIS — Z720 Tobacco use: Secondary | ICD-10-CM

## 2018-02-09 DIAGNOSIS — I739 Peripheral vascular disease, unspecified: Secondary | ICD-10-CM

## 2018-02-09 NOTE — Progress Notes (Signed)
Cardiology Office Note   Date:  02/09/2018   ID:  Lonnie [name], DOB 1951/05/02, MRN [id number]  PCP:  [name], MD  Cardiologist:  Dr. Sanjuana Kava  No chief complaint on file.     History of Present Illness: Lonnie [name] is a 67 y.o. male who presents for for a followup visit regarding peripheral arterial disease. He has known history of tobacco abuse, HTN and CAD . He was admitted to [facility] 10/29/13 with anterior STEMI and found to have moderately severe left main and LAD stenosis, moderately severe RCA stenosis. He underwent 4V CABG on 11/04/13 per Dr. Dorris Fetch (LIMA to mid LAD, SVG to OM, SVG to PDA, SVG to Diagonal). LVEF=40-45% prior to bypass.  He is known to have bilateral SFA occlusion with mild to moderate claudication currently treated medically. He has been doing well and denies any chest pain, shortness of breath or palpitations.  He has mild bilateral calf discomfort after walking about 8 minutes.  This does not require him to stop as the discomfort is usually not severe enough.  No rest pain. He is still trying to quit smoking.   Past Medical History:  Diagnosis Date  . Allergy   . CAD (coronary artery disease)   . Hypertension   . Ischemic cardiomyopathy   . MI (myocardial infarction) (HCC)   . Prediabetes 09/01/2014  . PVD (peripheral vascular disease) (HCC)    thighs  . Tobacco abuse     Past Surgical History:  Procedure Laterality Date  . ABDOMINAL AORTAGRAM N/A 06/07/2014   Procedure: ABDOMINAL Ronny Flurry;  Surgeon: Iran Ouch, MD;  Location: [facility] CATH LAB;  Service: Cardiovascular;  Laterality: N/A;  . ABDOMINAL AORTAGRAM N/A 03/07/2015   Procedure: ABDOMINAL Ronny Flurry;  Surgeon: Iran Ouch, MD;  Location: [facility] CATH LAB;  Service: Cardiovascular;  Laterality: N/A;  . APPENDECTOMY    . COLONOSCOPY WITH PROPOFOL N/A 10/11/2015   Procedure: COLONOSCOPY WITH PROPOFOL;  Surgeon: Charna Elizabeth, MD;  Location: [facility] ENDOSCOPY;  Service:  Endoscopy;  Laterality: N/A;  . CORONARY ARTERY BYPASS GRAFT N/A 11/04/2013   Procedure: CORONARY ARTERY BYPASS GRAFTING (CABG) TIMES FOUR  USING LEFT INTERNAL MAMMARY ARTERY AND RIGHT AND LEFT SAPHENOUS LEG VEIN HARVESTED ENDOSCOPICALLY;  Surgeon: Loreli Slot, MD;  Location: [facility] OR;  Service: Open Heart Surgery;  Laterality: N/A;  . FINGER SURGERY  2017   injury  . KNEE SURGERY     fractured patella  . LEFT HEART CATH N/A 10/29/2013   Procedure: LEFT HEART CATH;  Surgeon: Kathleene Hazel, MD;  Location: [facility] CATH LAB;  Service: Cardiovascular;  Laterality: N/A;  . LEFT HEART CATHETERIZATION WITH CORONARY ANGIOGRAM N/A 10/31/2013   Procedure: LEFT HEART CATHETERIZATION WITH CORONARY ANGIOGRAM;  Surgeon: Kathleene Hazel, MD;  Location: [facility] CATH LAB;  Service: Cardiovascular;  Laterality: N/A;  . MOUTH SURGERY    . TOE SURGERY       Current Outpatient Medications  Medication Sig Dispense Refill  . aspirin EC 81 MG tablet Take 1 tablet (81 mg total) by mouth daily. 90 tablet 3  . atorvastatin (LIPITOR) 80 MG tablet TAKE 1 TABLET BY MOUTH  DAILY AT 6 PM 90 tablet 2  . azelastine (ASTELIN) 0.1 % nasal spray Place 2 sprays into both nostrils at bedtime as needed for rhinitis. Use in each nostril as directed 30 mL 3  . buPROPion (WELLBUTRIN) 100 MG tablet Take 1 tablet (100 mg total) by mouth 2 (two) times  daily. 180 tablet 1  . cilostazol (PLETAL) 100 MG tablet Take 1 tablet (100 mg total) by mouth 2 (two) times daily. 180 tablet 2  . diphenhydrAMINE (BENADRYL) 25 MG tablet Take 25 mg by mouth every 6 (six) hours as needed for allergies.    Marland Kitchen. irbesartan (AVAPRO) 300 MG tablet TAKE 1 TABLET BY MOUTH  DAILY 90 tablet 1  . metoprolol tartrate (LOPRESSOR) 25 MG tablet TAKE 1 TABLET BY MOUTH TWO  TIMES DAILY 180 tablet 0   No current facility-administered medications for this visit.     Allergies:   Ace inhibitors; Dairy aid [lactase]; Eggs or egg-derived products; and  Peanut-containing drug products    Social History:  The patient  reports that he has been smoking cigarettes.  He has a 7.50 pack-year smoking history. he has never used smokeless tobacco. He reports that he does not drink alcohol or use drugs.   Family History:  The patient's family history includes AAA (abdominal aortic aneurysm) in his father; Alcohol abuse in his father; Arthritis in his mother; CAD (age of onset: 8869) in his father; Cardiomyopathy in his daughter; Diabetes in his father; Heart attack in his father and mother; Heart disease in his mother; Hypertension in his father and mother; Stroke in his mother.    ROS:  Please see the history of present illness.   Otherwise, review of systems are positive for none.   All other systems are reviewed and negative.    PHYSICAL EXAM: VS:  BP 138/84   Pulse (!) 55   Ht 6' (1.829 m)   Wt 182 lb 3.2 oz (82.6 kg)   BMI 24.71 kg/m  , BMI Body mass index is 24.71 kg/m. GEN: Well nourished, well developed, in no acute distress  HEENT: normal  Neck: no JVD, carotid bruits, or masses Cardiac: RRR; no murmurs, rubs, or gallops,no edema  Respiratory:  clear to auscultation bilaterally, normal work of breathing GI: soft, nontender, nondistended, + BS MS: no deformity or atrophy  Skin: warm and dry, no rash Neuro:  Strength and sensation are intact Psych: euthymic mood, full affect   EKG:  EKG is ordered today. The ekg ordered today demonstrates sinus bradycardia with possible old septal infarct.   Recent Labs: 01/12/2018: ALT 28; BUN 20; Creatinine, Ser 1.02; Hemoglobin 14.9; Platelets 266.0; Potassium 4.2; Sodium 139    Lipid Panel    Component Value Date/Time   CHOL 106 01/12/2018 0928   TRIG 42.0 01/12/2018 0928   HDL 47.90 01/12/2018 0928   CHOLHDL 2 01/12/2018 0928   VLDL 8.4 01/12/2018 0928   LDLCALC 50 01/12/2018 0928      Wt Readings from Last 3 Encounters:  02/09/18 182 lb 3.2 oz (82.6 kg)  01/12/18 180 lb 8 oz  (81.9 kg)  08/11/17 177 lb 6.4 oz (80.5 kg)        No flowsheet data found.    ASSESSMENT AND PLAN:  1. peripheral arterial disease :    He reports stable mild to moderate non-lifestyle limiting claudication.  I strongly advised him to start a regular walking program.  Continue treatment with cilostazol.  2. CAD: Stable s/p CABG November 2014. . Doing well. Continue ASA, statin, beta blocker.   3. Essential hypertension: Blood pressure is reasonably controlled on current medications.  4. Hyperlipidemia: Continue high-dose atorvastatin with target LDL <70.  I reviewed most recent lipid profile which was optimal.  5. Tobacco use:  I again discussed with him the importance of smoking  cessation.  He has cut down but did not quit completely.    Disposition:   FU with me in 6 months  Signed,  Lorine Bears, MD  02/09/2018 8:59 AM    [facility]

## 2018-02-09 NOTE — Patient Instructions (Signed)
Medication Instructions: Your physician recommends that you continue on your current medications as directed. Please refer to the Current Medication list given to you today.  If you need a refill on your cardiac medications before your next appointment, please call your pharmacy.   Follow-Up: Your physician wants you to follow-up in 6 months with Dr. Arida. You will receive a reminder letter in the mail two months in advance. If you don't receive a letter, please call our office at [phone] to schedule this follow-up appointment.   Thank you for choosing [facility]!!      

## 2018-02-15 ENCOUNTER — Telehealth: Payer: Self-pay | Admitting: Cardiovascular Disease

## 2018-02-15 NOTE — Telephone Encounter (Signed)
Can you check with our pharmacists to see what alternatives we are recommending?Marland Kitchen.  The list keeps changing and it is difficult to keep up with it.

## 2018-02-15 NOTE — Telephone Encounter (Signed)
Patient called in stating that his pharmacist pulled the irbesartan due to possible cancer causing agents in the medication. He would like a replacement medication for the Irbesartan 300 mg

## 2018-02-15 NOTE — Telephone Encounter (Signed)
Pt c/o medication issue:  1. Name of Medication: Irbesartan   2. How are you currently taking this medication (dosage and times per day)? 300 mg // 1x daily   3. Are you having a reaction (difficulty breathing--STAT)? no  4. What is your medication issue? Medication was pulled off the shelf because it causes a hazard via patient pharamacy  and patient would like to know if there is an alternative.

## 2018-02-15 NOTE — Telephone Encounter (Signed)
Patient intolerant to ACEI, so lets try amlodipine 5 mg daily.  He will need to check his BP daily for about 2 weeks and let us know if the readings become elevated.  He will probably need 10 mg dose, but we don't like to start with that.  Prefer to taper up if needed after a couple of weeks.  If he doesn't have a home BP cuff, schedule him to come to CVRR clinic about 2 weeks after switching meds.

## 2018-02-16 MED ORDER — AMLODIPINE BESYLATE 5 MG PO TABS: 5.0000 mg | ORAL_TABLET | Freq: Every day | ORAL | 11 refills | Status: DC

## 2018-02-16 NOTE — Telephone Encounter (Signed)
Patient made aware of instructions and starting Amlodipine 5 mg. He will call us back in 2 weeks with an update on his blood pressure readings.

## 2018-03-08 ENCOUNTER — Telehealth: Payer: Self-pay | Admitting: Cardiovascular Disease

## 2018-03-08 NOTE — Telephone Encounter (Signed)
Returned call to patient, advised medication that was part of recall was irbesartan.  Patient wanted to mark the medication bottle to make sure he doesn't take it.

## 2018-03-08 NOTE — Telephone Encounter (Signed)
New message  Pt c/o medication issue:  1. Name of Medication: amLODipine (NORVASC) 5 MG tablet  2. How are you currently taking this medication (dosage and times per day)? Take 1 tablet (5 mg total) by mouth daily.  3. Are you having a reaction (difficulty breathing--STAT)? no  4. What is your medication issue? Pt would like to know what medication he was taking prior to the amlodipine, that was causing cancer. Please call

## 2018-04-20 ENCOUNTER — Other Ambulatory Visit: Payer: Self-pay | Admitting: Internal Medicine

## 2018-04-20 ENCOUNTER — Other Ambulatory Visit: Payer: Self-pay | Admitting: Cardiovascular Disease

## 2018-04-23 ENCOUNTER — Encounter: Payer: Self-pay | Admitting: Cardiovascular Disease

## 2018-04-27 ENCOUNTER — Other Ambulatory Visit: Payer: Self-pay | Admitting: Cardiovascular Disease

## 2018-04-27 MED ORDER — AMLODIPINE BESYLATE 5 MG PO TABS: 5.0000 mg | ORAL_TABLET | Freq: Every day | ORAL | 2 refills | Status: DC

## 2018-05-05 ENCOUNTER — Ambulatory Visit: Payer: 59 | Admitting: Cardiovascular Disease

## 2018-05-25 ENCOUNTER — Encounter: Payer: Self-pay | Admitting: Physician Assistant

## 2018-05-25 NOTE — Progress Notes (Signed)
Cardiology Office Note    Date:  05/26/2018  ID:  Lonnie [name], DOB [birth year], MRN [id number] PCP:  [name], MD  Cardiologist:  Verne Carrow, MD   Chief Complaint: routine checkup  History of Present Illness:  Lonnie [name] is a 67 y.o. male with history of CAD (anterior STEMI 10/2013 s/p 4V CABG), ischemic cardiomyopathy (EF 40-45% prior to bypass), prior angioedema with ACE 2015, HTN, pre-diabetes, tobacco abuse and PAD who is here for yearly follow-up. Last echo 04/2017 showed continued cardiomyopathy with EF 40-45%, hypokinesis of the inferolateral, inferior, and inferoseptal myocardium, grade 1 DD, mild TR. He has h/o L SFA stents, and has since been known to have bilateral SFA occlusion treated medically with cilostazol. His angiogram in 2016 showed normal size abdominal aorta. Last labs 12/2017 showed A1C 6.3, normal CBC, LDL 50, K 4.2, Cr 1.02.  He returns for follow-up overall having had an uneventful year. He has remained busy going back and forth to [place] trying to sell some furniture that belonged to his deceased parents. He is a member of [employer] but does not go. He wants to get back into physical activity. He reports some episodic numbness in the top of his L forefoot but no nonhealing wounds. Claudication is stable after several minutes, remains fairly mild and not lifestyle limiting. He called in several months ago regarding ARB recall and Dr. Kirke Corin deferred to pharmacist, who changed him to amlodipine. He remains concerned about the potential cancer risks of medications. He continues to smoke, but is hopeful to cut down.  Past Medical History:  Diagnosis Date  . Allergy   . CAD (coronary artery disease)    a. anterior STEMI 10/2013 s/p 4V CABG with LIMA to mid LAD, SVG to OM, SVG to PDA, SVG to Diagonal.  . Hypertension   . Ischemic cardiomyopathy    a. EF 40-45% at time of CABG and in 2018.  . MI (myocardial infarction) (HCC)   . Prediabetes  09/01/2014  . PVD (peripheral vascular disease) (HCC)    a. s/p L SFA stents with now known bilateral SFA occlusion treated medically.  . Tobacco abuse     Past Surgical History:  Procedure Laterality Date  . ABDOMINAL AORTAGRAM N/A 06/07/2014   Procedure: ABDOMINAL Ronny Flurry;  Surgeon: Iran Ouch, MD;  Location: [facility] CATH LAB;  Service: Cardiovascular;  Laterality: N/A;  . ABDOMINAL AORTAGRAM N/A 03/07/2015   Procedure: ABDOMINAL Ronny Flurry;  Surgeon: Iran Ouch, MD;  Location: [facility] CATH LAB;  Service: Cardiovascular;  Laterality: N/A;  . APPENDECTOMY    . COLONOSCOPY WITH PROPOFOL N/A 10/11/2015   Procedure: COLONOSCOPY WITH PROPOFOL;  Surgeon: Charna Elizabeth, MD;  Location: [facility] ENDOSCOPY;  Service: Endoscopy;  Laterality: N/A;  . CORONARY ARTERY BYPASS GRAFT N/A 11/04/2013   Procedure: CORONARY ARTERY BYPASS GRAFTING (CABG) TIMES FOUR  USING LEFT INTERNAL MAMMARY ARTERY AND RIGHT AND LEFT SAPHENOUS LEG VEIN HARVESTED ENDOSCOPICALLY;  Surgeon: Loreli Slot, MD;  Location: [facility] OR;  Service: Open Heart Surgery;  Laterality: N/A;  . FINGER SURGERY  2017   injury  . KNEE SURGERY     fractured patella  . LEFT HEART CATH N/A 10/29/2013   Procedure: LEFT HEART CATH;  Surgeon: Kathleene Hazel, MD;  Location: [facility] CATH LAB;  Service: Cardiovascular;  Laterality: N/A;  . LEFT HEART CATHETERIZATION WITH CORONARY ANGIOGRAM N/A 10/31/2013   Procedure: LEFT HEART CATHETERIZATION WITH CORONARY ANGIOGRAM;  Surgeon: Kathleene Hazel, MD;  Location: [facility]  CATH LAB;  Service: Cardiovascular;  Laterality: N/A;  . MOUTH SURGERY    . TOE SURGERY      Current Medications: Current Meds  Medication Sig  . Acai Berry 500 MG CAPS Take 2 capsules by mouth 2 (two) times daily.  Marland Kitchen. amLODipine (NORVASC) 5 MG tablet Take 1 tablet (5 mg total) by mouth daily.  Marland Kitchen. aspirin EC 81 MG tablet Take 1 tablet (81 mg total) by mouth daily.  Marland Kitchen. atorvastatin (LIPITOR) 80 MG tablet TAKE 1 TABLET BY MOUTH  DAILY AT  6 PM  . azelastine (ASTELIN) 0.1 % nasal spray Place 2 sprays into both nostrils at bedtime as needed for rhinitis. Use in each nostril as directed  . buPROPion (WELLBUTRIN) 100 MG tablet Take 1 tablet (100 mg total) by mouth 2 (two) times daily.  . Calcium Carb-Cholecalciferol (CALCIUM 600+D) 600-800 MG-UNIT TABS Take 1 tablet by mouth daily.  . cilostazol (PLETAL) 100 MG tablet Take 1 tablet (100 mg total) by mouth 2 (two) times daily.  . diphenhydrAMINE (BENADRYL) 25 MG tablet Take 25 mg by mouth every 6 (six) hours as needed for allergies.  Marland Kitchen. L-Arginine 500 MG CAPS Take 1 capsule by mouth daily.  . metoprolol tartrate (LOPRESSOR) 25 MG tablet Take 1 tablet (25 mg total) by mouth 2 (two) times daily. Please keep upcoming appt for future refills. Thank you (Patient taking differently: Take 25 mg by mouth 2 (two) times daily. )  . OVER THE COUNTER MEDICATION Take 1 capsule by mouth 2 (two) times daily.  Marland Kitchen. OVER THE COUNTER MEDICATION Take 1 tablet by mouth daily.    Allergies:   Ace inhibitors; Dairy aid [lactase]; Eggs or egg-derived products; and Peanut-containing drug products   Social History   Socioeconomic History  . Marital status: Married    Spouse name: [name]  . Number of children: 1  . Years of education: Not on file  . Highest education level: Not on file  Occupational History  . 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 Financial resource strain: Not on file  . Food insecurity:    Worry: Not on file    Inability: Not on file  . Transportation needs:    Medical: Not on file    Non-medical: Not on file  Tobacco Use  . Smoking status: Current Every Day Smoker    Packs/day: 0.25    Years: 30.00    Pack years: 7.50    Types: Cigarettes  . Smokeless tobacco: Never Used  . Tobacco comment: < 1/2 ppd  Substance and Sexual Activity  . Alcohol use: No    Alcohol/week: 0.0 oz  . Drug use: No  . Sexual activity: Yes  Lifestyle   . Physical activity:    Days per week: Not on file    Minutes per session: Not on file  . Stress: Not on file  Relationships  . Social connections:    Talks on phone: Not on file    Gets together: Not on file    Attends religious service: Not on file    Active member of club or organization: Not on file    Attends meetings of clubs or organizations: Not on file    Relationship status: Not on file  Other Topics Concern  . Not on file  Social History Narrative   Household-- pt and wife   1 daughter      Family History:  The patient's family history  includes AAA (abdominal aortic aneurysm) in his father; Alcohol abuse in his father; Arthritis in his mother; CAD (age of onset: 75) in his father; Cardiomyopathy in his daughter; Diabetes in his father; Heart attack in his father and mother; Heart disease in his mother; Hypertension in his father and mother; Stroke in his mother. There is no history of Colon cancer or Prostate cancer.  ROS:   Please see the history of present illness.  All other systems are reviewed and otherwise negative.    PHYSICAL EXAM:   VS:  BP (!) 148/84   Pulse (!) 53   Ht 6' (1.829 m)   Wt 179 lb 1.9 oz (81.2 kg)   BMI 24.29 kg/m   BMI: Body mass index is 24.29 kg/m. GEN: Well nourished, well developed AAM, in no acute distress HEENT: normocephalic, atraumatic Neck: no JVD, carotid bruits, or masses Cardiac: RRR; no murmurs, rubs, or gallops, no edema, 1+ pedal pulses bilaterally Respiratory:  clear to auscultation bilaterally, normal work of breathing GI: soft, nontender, nondistended, + BS MS: no deformity or atrophy Skin: warm and dry, no rash Neuro:  Alert and Oriented x 3, Strength and sensation are intact, follows commands Psych: euthymic mood, full affect  Wt Readings from Last 3 Encounters:  05/26/18 179 lb 1.9 oz (81.2 kg)  02/09/18 182 lb 3.2 oz (82.6 kg)  01/12/18 180 lb 8 oz (81.9 kg)      Studies/Labs Reviewed:   EKG:  EKG was  ordered today and personally reviewed by me and demonstrates sinus bradycardia 53bpm, nonspecific ST changes otherwise nonacute.  Recent Labs: 01/12/2018: ALT 28; BUN 20; Creatinine, Ser 1.02; Hemoglobin 14.9; Platelets 266.0; Potassium 4.2; Sodium 139   Lipid Panel    Component Value Date/Time   CHOL 106 01/12/2018 0928   TRIG 42.0 01/12/2018 0928   HDL 47.90 01/12/2018 0928   CHOLHDL 2 01/12/2018 0928   VLDL 8.4 01/12/2018 0928   LDLCALC 50 01/12/2018 0928    Additional studies/ records that were reviewed today include: Summarized above    ASSESSMENT & PLAN:   1. CAD - doing well clinically. Last LDL at goal. Continue ASA, statin, beta blocker. Encouraged to increase physical activity as tolerated, also discussed importance of smoking cessation. 2. Ischemic cardiomyopathy - has had persistent LV dysfunction since bypass surgery. Ideally given his PAD and LV dysfunction he should be back on ARB rather than amlodipine. I reviewed with pharmD. Although irbesartan is not technically in the guidelines for HF, it is an ARB that he has tolerated in the past without adverse effect (has h/o angioedema on ACEI). Therefore we will stop amlodipine and resume irbesartan 300mg  daily. He was somewhat confused about what he was actually taking so he will clarify with his bottles at home that he has NOT been on irbesartan recently, and will call us to make sure. In discussing with pharmacy, it sounds like the ARBs that have come back in stock most recently have been deemed clean by the FDA. We will have him return in 1-2 weeks to HTN clinic for recheck BP at which time spironolactone can be considered given his LVEF and high BP . Clinically appears euvolemic. Reviewed 2g sodium restriction, 2L fluid restriction, daily weights, CHF symptoms with patient. 3. Essential HTN - remains elevated on present regimen. See above. 4. Tobacco abuse - counseled on importance of cessation. We discussed that ongoing  tobacco abuse is just as much of a cancer concern as the ARB recall, and also  discussed that his PAD and CAD are likely to progress in the upcoming years if he does not stop. He verbalizes motivation to continue to cut down. 5. PAD - clinically stable. He has not yet engaged in walking program or quit smoking. We discussed the importance of these actions long term.  Disposition: F/u with HTN clinic in 1-2 weeks for recheck BP. He is due for recall with Dr. Kirke Corin 07/2018 per last note. Will also arrange f/u 1 year with Dr. Clifton James.   Medication Adjustments/Labs and Tests Ordered: Current medicines are reviewed at length with the patient today.  Concerns regarding medicines are outlined above. Medication changes, Labs and Tests ordered today are summarized above and listed in the Patient Instructions accessible in Encounters.   Signed, Laurann Montana, PA-C  05/26/2018 8:24 AM    [facility] [address] [place], [place], [place]  [address] Phone: [phone]; Fax: [phone]

## 2018-05-26 ENCOUNTER — Encounter: Payer: Self-pay | Admitting: Physician Assistant

## 2018-05-26 ENCOUNTER — Ambulatory Visit (INDEPENDENT_AMBULATORY_CARE_PROVIDER_SITE_OTHER): Payer: 59 | Admitting: Physician Assistant

## 2018-05-26 VITALS — BP 148/84 | HR 53 | Ht 72.0 in | Wt 179.1 lb

## 2018-05-26 DIAGNOSIS — I739 Peripheral vascular disease, unspecified: Secondary | ICD-10-CM

## 2018-05-26 DIAGNOSIS — I255 Ischemic cardiomyopathy: Secondary | ICD-10-CM

## 2018-05-26 DIAGNOSIS — I2510 Atherosclerotic heart disease of native coronary artery without angina pectoris: Secondary | ICD-10-CM | POA: Diagnosis not present

## 2018-05-26 DIAGNOSIS — Z720 Tobacco use: Secondary | ICD-10-CM

## 2018-05-26 DIAGNOSIS — I10 Essential (primary) hypertension: Secondary | ICD-10-CM | POA: Diagnosis not present

## 2018-05-26 MED ORDER — IRBESARTAN 300 MG PO TABS: 300.0000 mg | ORAL_TABLET | Freq: Every day | ORAL | 3 refills | Status: DC

## 2018-05-26 NOTE — Patient Instructions (Addendum)
Medication Instructions: Your physician has recommended you make the following change in your medication:  STOP: Amlodipine  START: Irbesartan 300 mg taking 1 tablet daily   Labwork: None   Procedures/Testing: None  Follow-Up: Your physician recommends that you schedule a follow-up appointment in: 1-2 weeks with the hypertension clinic.  Schedule a follow up visit to see Dr.Arida in August 2019  Schedule a follow up visit to see Dr.Mcalhany in 1 year     Any Additional Special Instructions Will Be Listed Below (If Applicable).  Please monitor your blood pressure occasionally at home. Call your doctor if you tend to get readings of greater than 130 on the top number or 80 on the bottom number.   For patients with weak heart muscles, we give them these special instructions:  1. Follow a low-salt diet - you are allowed no more than 2,000mg  of sodium per day. Watch your fluid intake. In general, you should not be taking in more than 2 liters of fluid per day (no more than 8 glasses per day). This includes sources of water in foods like soup, coffee, tea, milk, etc. 2. Weigh yourself on the same scale at same time of day and keep a log. 3. Call your doctor: (Anytime you feel any of the following symptoms)  - 3lb weight gain overnight or 5lb within a few days - Shortness of breath, with or without a dry hacking cough  - Swelling in the hands, feet or stomach  - If you have to sleep on extra pillows at night in order to breathe   IT IS IMPORTANT TO LET YOUR DOCTOR KNOW EARLY ON IF YOU ARE HAVING SYMPTOMS SO WE CAN HELP YOU!      If you need a refill on your cardiac medications before your next appointment, please call your pharmacy.

## 2018-06-22 ENCOUNTER — Other Ambulatory Visit: Payer: Self-pay | Admitting: Physician Assistant

## 2018-06-22 ENCOUNTER — Ambulatory Visit: Payer: 59

## 2018-06-23 NOTE — Telephone Encounter (Signed)
Can you please make this change on the pt's medication list. Thanks

## 2018-06-23 NOTE — Telephone Encounter (Signed)
Pt's pharmacy is requesting Alternative for irbesartan.  Medication is ON [facility] BACK-ORDER, PRODUCT NOT AVAILABLE, PLEASE CONSIDER ALTERNATIVE candesartan 32 mg tablet, THANKS. Please address

## 2018-06-23 NOTE — Telephone Encounter (Signed)
Ok to switch from irbesartan 300mg  to equivalent dose of candesartan 32mg  daily due to ARB backorders.

## 2018-06-23 NOTE — Telephone Encounter (Signed)
I spoke with [facility] and told them OK to change to candesartan 32 mg daily due to irbesartan backorder.  [facility] will notify pt of this change. Will send new prescription to [facility]

## 2018-07-02 ENCOUNTER — Other Ambulatory Visit: Payer: Self-pay | Admitting: Cardiovascular Disease

## 2018-07-13 ENCOUNTER — Ambulatory Visit: Payer: 59 | Admitting: Internal Medicine

## 2018-07-28 ENCOUNTER — Encounter: Payer: Self-pay | Admitting: Internal Medicine

## 2018-07-28 ENCOUNTER — Ambulatory Visit (INDEPENDENT_AMBULATORY_CARE_PROVIDER_SITE_OTHER): Payer: Medicare HMO | Admitting: Internal Medicine

## 2018-07-28 VITALS — BP 136/84 | HR 49 | Temp 98.1°F | Resp 16 | Ht 72.0 in | Wt 176.0 lb

## 2018-07-28 DIAGNOSIS — F419 Anxiety disorder, unspecified: Secondary | ICD-10-CM

## 2018-07-28 DIAGNOSIS — Z720 Tobacco use: Secondary | ICD-10-CM | POA: Diagnosis not present

## 2018-07-28 DIAGNOSIS — R69 Illness, unspecified: Secondary | ICD-10-CM | POA: Diagnosis not present

## 2018-07-28 DIAGNOSIS — R739 Hyperglycemia, unspecified: Secondary | ICD-10-CM

## 2018-07-28 DIAGNOSIS — I10 Essential (primary) hypertension: Secondary | ICD-10-CM

## 2018-07-28 DIAGNOSIS — N529 Male erectile dysfunction, unspecified: Secondary | ICD-10-CM | POA: Diagnosis not present

## 2018-07-28 DIAGNOSIS — F32A Depression, unspecified: Secondary | ICD-10-CM

## 2018-07-28 DIAGNOSIS — F329 Major depressive disorder, single episode, unspecified: Secondary | ICD-10-CM

## 2018-07-28 LAB — BASIC METABOLIC PANEL
BUN: 19 mg/dL (ref 6–23)
CO2: 25 mEq/L (ref 19–32)
Calcium: 9.5 mg/dL (ref 8.4–10.5)
Chloride: 109 mEq/L (ref 96–112)
Creatinine, Ser: 1.03 mg/dL (ref 0.40–1.50)
GFR: 93.17 mL/min (ref >60.00)
Glucose, Bld: 108 mg/dL — ABNORMAL HIGH (ref 70–99)
Potassium: 4.2 mEq/L (ref 3.5–5.1)
Sodium: 140 mEq/L (ref 135–145)

## 2018-07-28 LAB — HEMOGLOBIN A1C: Hgb A1c MFr Bld: 6.3 % (ref 4.6–6.5)

## 2018-07-28 NOTE — Progress Notes (Signed)
Pre visit review using our clinic review tool, if applicable. No additional management support is needed unless otherwise documented below in the visit note. 

## 2018-07-28 NOTE — Assessment & Plan Note (Signed)
Diabetes: Check a A1c HTN: Note from cardiology 05/26/2018 reviewed, amlodipine was stopped and he was rx  irbesartan, medication list however shows candesartan.  Patient is not sure which one is taking.  We will check a BMP, recommend to check the medications he is actually taking when he goes home and communicate that with cardiology.   Anxiety: Continue with stress, home related, counseled; listening therapy provided. Tobacco abuse: Counseled, not committed to quit. Lung cancer screening: Option is doing a CT for screening benefit of a screening discussed with the patient.  he will let me know when ready. ED : rx a medication? D/t other medical  issues I'm reluctant to Rx sildenafil RTC 5 months, CPX

## 2018-07-28 NOTE — Patient Instructions (Signed)
GO TO THE LAB : Get the blood work     GO TO THE FRONT DESK Schedule your next appointment for a physical exam in 5 months  Please review the medications that you are taking when you go home. Call cardiology, let them know what exactly you are taking, they have been trying to help you with your blood pressure medication.
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
## 2018-08-03 ENCOUNTER — Encounter: Payer: Self-pay | Admitting: Cardiovascular Disease

## 2018-08-03 ENCOUNTER — Ambulatory Visit (INDEPENDENT_AMBULATORY_CARE_PROVIDER_SITE_OTHER): Payer: Medicare HMO | Admitting: Cardiovascular Disease

## 2018-08-03 VITALS — BP 147/87 | HR 52 | Ht 74.0 in | Wt 176.2 lb

## 2018-08-03 DIAGNOSIS — E785 Hyperlipidemia, unspecified: Secondary | ICD-10-CM

## 2018-08-03 DIAGNOSIS — Z720 Tobacco use: Secondary | ICD-10-CM | POA: Diagnosis not present

## 2018-08-03 DIAGNOSIS — I10 Essential (primary) hypertension: Secondary | ICD-10-CM | POA: Diagnosis not present

## 2018-08-03 DIAGNOSIS — I2510 Atherosclerotic heart disease of native coronary artery without angina pectoris: Secondary | ICD-10-CM

## 2018-08-03 DIAGNOSIS — I739 Peripheral vascular disease, unspecified: Secondary | ICD-10-CM | POA: Diagnosis not present

## 2018-08-03 NOTE — Patient Instructions (Signed)

## 2018-08-03 NOTE — Progress Notes (Signed)
Cardiology Office Note   Date:  08/03/2018   ID:  Lonnie [name], DOB 05/21/1951, MRN [id number]  PCP:  Wanda PlumpPaz, Jose E, MD  Cardiologist:  Dr. Sanjuana KavaMcAlhaney  Chief Complaint  Patient presents with  . Follow-up    pt denied chest pain      History of Present Illness: Lonnie [name] is a 67 y.o. male who presents for for a followup visit regarding peripheral arterial disease. He has known history of tobacco abuse, HTN and CAD . He was admitted to [facility] 10/29/13 with anterior STEMI and found to have moderately severe left main and LAD stenosis, moderately severe RCA stenosis. He underwent 4V CABG on 11/04/13 per Dr. Dorris FetchHendrickson (LIMA to mid LAD, SVG to OM, SVG to PDA, SVG to Diagonal). LVEF=40-45% prior to bypass.  He is known to have bilateral SFA occlusion with mild to moderate claudication currently treated medically. He has been doing well and denies any chest pain, shortness of breath or palpitations.  He has mild bilateral calf discomfort after walking about 8 minutes.  This does not require him to stop as the discomfort is usually not severe enough.  No rest pain.  He reports no change in symptoms.  He continues to be under stress.  Initially he stopped taking irbesartan as he saw a commercial about records for cancer.  We did explain to him that not all of these medications were affected by the recall.  He is back on the medication.  Past Medical History:  Diagnosis Date  . Allergy   . CAD (coronary artery disease)    a. anterior STEMI 10/2013 s/p 4V CABG with LIMA to mid LAD, SVG to OM, SVG to PDA, SVG to Diagonal.  . Hypertension   . Ischemic cardiomyopathy    a. EF 40-45% at time of CABG and in 2018.  . MI (myocardial infarction) (HCC)   . Prediabetes 09/01/2014  . PVD (peripheral vascular disease) (HCC)    a. s/p L SFA stents with now known bilateral SFA occlusion treated medically.  . Tobacco abuse     Past Surgical History:  Procedure Laterality Date  .  ABDOMINAL AORTAGRAM N/A 06/07/2014   Procedure: ABDOMINAL Ronny FlurryAORTAGRAM;  Surgeon: Iran OuchMuhammad A Deontaye Civello, MD;  Location: [facility] CATH LAB;  Service: Cardiovascular;  Laterality: N/A;  . ABDOMINAL AORTAGRAM N/A 03/07/2015   Procedure: ABDOMINAL Ronny FlurryAORTAGRAM;  Surgeon: Iran OuchMuhammad A Coty Larsh, MD;  Location: [facility] CATH LAB;  Service: Cardiovascular;  Laterality: N/A;  . APPENDECTOMY    . COLONOSCOPY WITH PROPOFOL N/A 10/11/2015   Procedure: COLONOSCOPY WITH PROPOFOL;  Surgeon: Charna ElizabethJyothi Mann, MD;  Location: [facility] ENDOSCOPY;  Service: Endoscopy;  Laterality: N/A;  . CORONARY ARTERY BYPASS GRAFT N/A 11/04/2013   Procedure: CORONARY ARTERY BYPASS GRAFTING (CABG) TIMES FOUR  USING LEFT INTERNAL MAMMARY ARTERY AND RIGHT AND LEFT SAPHENOUS LEG VEIN HARVESTED ENDOSCOPICALLY;  Surgeon: Loreli SlotSteven C Hendrickson, MD;  [facility];  Service: Open Heart Surgery;  Laterality: N/A;  . FINGER SURGERY  2017   injury  . KNEE SURGERY     fractured patella  . LEFT HEART CATH N/A 10/29/2013   Procedure: LEFT HEART CATH;  Surgeon: Kathleene Hazelhristopher D McAlhany, MD;  [facility];  Service: Cardiovascular;  Laterality: N/A;  . LEFT HEART CATHETERIZATION WITH CORONARY ANGIOGRAM N/A 10/31/2013   Procedure: LEFT HEART CATHETERIZATION WITH CORONARY ANGIOGRAM;  Surgeon: Kathleene Hazelhristopher D McAlhany, MD;  [facility];  Service: Cardiovascular;  Laterality: N/A;  . MOUTH SURGERY    .  TOE SURGERY       Current Outpatient Medications  Medication Sig Dispense Refill  . Acai Berry 500 MG CAPS Take 2 capsules by mouth 2 (two) times daily.    Marland Kitchen. aspirin EC 81 MG tablet Take 1 tablet (81 mg total) by mouth daily. 90 tablet 3  . atorvastatin (LIPITOR) 80 MG tablet TAKE 1 TABLET BY MOUTH  DAILY AT 6 PM 90 tablet 2  . azelastine (ASTELIN) 0.1 % nasal spray Place 2 sprays into both nostrils at bedtime as needed for rhinitis. Use in each nostril as directed 30 mL 3  . buPROPion (WELLBUTRIN) 100 MG tablet Take 1 tablet (100 mg total) by mouth 2 (two) times  daily. 180 tablet 1  . Calcium Carb-Cholecalciferol (CALCIUM 600+D) 600-800 MG-UNIT TABS Take 1 tablet by mouth daily.    . cilostazol (PLETAL) 100 MG tablet Take 1 tablet (100 mg total) by mouth 2 (two) times daily. 180 tablet 2  . L-Arginine 500 MG CAPS Take 1 capsule by mouth daily.    . metoprolol tartrate (LOPRESSOR) 25 MG tablet TAKE 1 TABLET BY MOUTH 2  TIMES DAILY. 180 tablet 3  . OVER THE COUNTER MEDICATION Take 1 capsule by mouth 2 (two) times daily.    Marland Kitchen. OVER THE COUNTER MEDICATION Take 1 tablet by mouth daily.    . candesartan (ATACAND) 32 MG tablet Take 1 tablet (32 mg total) by mouth daily. (Patient not taking: Reported on 08/03/2018) 30 tablet 11   No current facility-administered medications for this visit.     Allergies:   Ace inhibitors; Dairy aid [lactase]; Eggs or egg-derived products; and Peanut-containing drug products    Social History:  The patient  reports that he has been smoking cigarettes. He has a 7.50 pack-year smoking history. He has never used smokeless tobacco. He reports that he does not drink alcohol or use drugs.   Family History:  The patient's family history includes AAA (abdominal aortic aneurysm) in his father; Alcohol abuse in his father; Arthritis in his mother; CAD (age of onset: 4369) in his father; Cardiomyopathy in his daughter; Diabetes in his father; Heart attack in his father and mother; Heart disease in his mother; Hypertension in his father and mother; Stroke in his mother.    ROS:  Please see the history of present illness.   Otherwise, review of systems are positive for none.   All other systems are reviewed and negative.    PHYSICAL EXAM: VS:  BP (!) 147/87   Pulse (!) 52   Ht 6\' 2"  (1.88 m)   Wt 176 lb 3.2 oz (79.9 kg)   BMI 22.62 kg/m  , BMI Body mass index is 22.62 kg/m. GEN: Well nourished, well developed, in no acute distress  HEENT: normal  Neck: no JVD, carotid bruits, or masses Cardiac: RRR; no murmurs, rubs, or gallops,no  edema  Respiratory:  clear to auscultation bilaterally, normal work of breathing GI: soft, nontender, nondistended, + BS MS: no deformity or atrophy  Skin: warm and dry, no rash Neuro:  Strength and sensation are intact Psych: euthymic mood, full affect   EKG:  EKG is not ordered today.   Recent Labs: 01/12/2018: ALT 28; Hemoglobin 14.9; Platelets 266.0 07/28/2018: BUN 19; Creatinine, Ser 1.03; Potassium 4.2; Sodium 140    Lipid Panel    Component Value Date/Time   CHOL 106 01/12/2018 0928   TRIG 42.0 01/12/2018 0928   HDL 47.90 01/12/2018 0928   CHOLHDL 2 01/12/2018 [id number]  VLDL 8.4 01/12/2018 0928   LDLCALC 50 01/12/2018 0928      Wt Readings from Last 3 Encounters:  08/03/18 176 lb 3.2 oz (79.9 kg)  07/28/18 176 lb (79.8 kg)  05/26/18 179 lb 1.9 oz (81.2 kg)        No flowsheet data found.    ASSESSMENT AND PLAN:  1. peripheral arterial disease :    At the present time, he denies claudication but he is not exercising as much as before.  I encouraged him to resume exercising. Continue treatment with cilostazol.  2. CAD: Stable s/p CABG November 2014. . Doing well. Continue ASA, statin, beta blocker.   3. Essential hypertension: Currently on metoprolol and irbesartan.  We should consider switching metoprolol to carvedilol.  4. Hyperlipidemia: Continue high-dose atorvastatin with target LDL <70.  Most recent LDL was 50  5. Tobacco use:   He has cut down but did not quit completely.    Disposition:   FU with me in 12 months  Signed,  Lorine Bears, MD  08/03/2018 7:59 AM    [facility]

## 2018-08-18 ENCOUNTER — Ambulatory Visit: Payer: 59 | Admitting: Internal Medicine

## 2018-09-06 ENCOUNTER — Telehealth: Payer: Self-pay | Admitting: Internal Medicine

## 2018-09-06 ENCOUNTER — Other Ambulatory Visit: Payer: Self-pay | Admitting: Cardiovascular Disease

## 2018-09-06 MED ORDER — CILOSTAZOL 100 MG PO TABS: 100.0000 mg | ORAL_TABLET | Freq: Two times a day (BID) | ORAL | 3 refills | Status: DC

## 2018-09-06 MED ORDER — METOPROLOL TARTRATE 25 MG PO TABS: 25.0000 mg | ORAL_TABLET | Freq: Two times a day (BID) | ORAL | 3 refills | Status: DC

## 2018-09-06 MED ORDER — IRBESARTAN 300 MG PO TABS: 300.0000 mg | ORAL_TABLET | Freq: Every day | ORAL | 3 refills | Status: DC

## 2018-09-06 MED ORDER — ATORVASTATIN CALCIUM 80 MG PO TABS: ORAL_TABLET | ORAL | 3 refills | Status: DC

## 2018-09-06 MED ORDER — CILOSTAZOL 100 MG PO TABS: 100.0000 mg | ORAL_TABLET | Freq: Two times a day (BID) | ORAL | 0 refills | Status: DC
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
## 2018-09-06 NOTE — Telephone Encounter (Signed)
Please advise patient wife: If he has suicidal thoughts, needs to call for help immediately, see below.  Otherwise we will see him on follow-up.  It would be extremely helpful if she can come with the patient   SUICIDE PREVENTION: Find Help 24/7, [place] Call our 24-hour HelpLine at 5518878403913-608-7695 or 567 [phone]   National Hopeline Network: 1-800-SUICIDE  The National Suicide Prevention Lifeline: [employer]1-800-273-TALK

## 2018-09-06 NOTE — Telephone Encounter (Signed)
thx
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
## 2018-09-06 NOTE — Telephone Encounter (Signed)
[name] called stating that pt's has changed insurance and mail order pharmacy. I changed the pharmacy and resent in pt's medications as requested. Confirmation received.

## 2018-09-09 ENCOUNTER — Ambulatory Visit (INDEPENDENT_AMBULATORY_CARE_PROVIDER_SITE_OTHER): Payer: Medicare HMO | Admitting: Internal Medicine

## 2018-09-09 ENCOUNTER — Encounter: Payer: Self-pay | Admitting: Internal Medicine

## 2018-09-09 VITALS — BP 138/80 | HR 49 | Temp 98.4°F | Resp 16 | Ht 74.0 in | Wt 178.1 lb

## 2018-09-09 DIAGNOSIS — F419 Anxiety disorder, unspecified: Secondary | ICD-10-CM | POA: Diagnosis not present

## 2018-09-09 DIAGNOSIS — F329 Major depressive disorder, single episode, unspecified: Secondary | ICD-10-CM | POA: Diagnosis not present

## 2018-09-09 DIAGNOSIS — F32A Depression, unspecified: Secondary | ICD-10-CM

## 2018-09-09 DIAGNOSIS — R69 Illness, unspecified: Secondary | ICD-10-CM | POA: Diagnosis not present

## 2018-09-09 MED ORDER — BUPROPION HCL 75 MG PO TABS: ORAL_TABLET | ORAL | 1 refills | Status: DC
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
## 2018-09-09 NOTE — Progress Notes (Signed)
Pre visit review using our clinic review tool, if applicable. No additional management support is needed unless otherwise documented below in the visit note. 

## 2018-09-09 NOTE — Patient Instructions (Signed)
Stop Wellbutrin 100 mg  Start Wellbutrin 75 mg. 1 tablet twice a day for 2 weeks Half tablet twice a day for 2 weeks Half tablet daily for 2 weeks Then stop  Please see one of the psychiatrists as soon as possible.  If you need help obtaining a office visit let us know  Please reconsider see one of the counselors in town  If you feel unsafe at home reach for help  SUICIDE PREVENTION:  Find Help 24/7,  Call our 24-hour HelpLine at 504-430-14763254189513 or (905)[phone]    National Hopeline Network: 1-800-SUICIDE   The National Suicide Prevention Lifeline: [employer]1-800-273-TALK

## 2018-09-10 NOTE — Assessment & Plan Note (Signed)
Behavioral issues, anxiety depression: The patient admits that he is a still extremely sad and grieving regards the death of his parents ~  [id number] and in 2014.  He also admits that he is somewhat distressed about renovating the house he inherited from them. The patient likes to stop Wellbutrin, he was a started few years ago for tobacco management, he is still smoking. I did my best counseling the patient, explained that his grieving is prolonged and unusual.  I emphasized the need for counseling. He is certain  he will not do that and he will simply be sad about his parents that until he dies. Eventually we agreed on the following: -Wean off bupropion -Needs to see a psychiatrist (I think needs a specialist to manage his medications d/t behavioral issues) he is agreeable ; provided a list of psychiatrists in town. Recommend counseling,  strongly refuse it; there are some complicated family dynamics, he blames his wife-daughter on "ganging up on him". -We had a prolonged discussion about suicidality and resources provided.

## 2018-11-22 DIAGNOSIS — M545 Low back pain: Secondary | ICD-10-CM | POA: Diagnosis not present

## 2018-11-25 DIAGNOSIS — H401221 Low-tension glaucoma, left eye, mild stage: Secondary | ICD-10-CM | POA: Diagnosis not present

## 2018-11-25 DIAGNOSIS — H401212 Low-tension glaucoma, right eye, moderate stage: Secondary | ICD-10-CM | POA: Diagnosis not present

## 2018-12-27 ENCOUNTER — Encounter: Payer: Self-pay | Admitting: Internal Medicine

## 2018-12-27 ENCOUNTER — Ambulatory Visit (INDEPENDENT_AMBULATORY_CARE_PROVIDER_SITE_OTHER): Payer: Medicare HMO | Admitting: Internal Medicine

## 2018-12-27 VITALS — BP 168/92 | HR 52 | Temp 98.8°F | Resp 16 | Ht 74.0 in | Wt 183.5 lb

## 2018-12-27 DIAGNOSIS — I10 Essential (primary) hypertension: Secondary | ICD-10-CM

## 2018-12-27 DIAGNOSIS — E785 Hyperlipidemia, unspecified: Secondary | ICD-10-CM | POA: Diagnosis not present

## 2018-12-27 DIAGNOSIS — F329 Major depressive disorder, single episode, unspecified: Secondary | ICD-10-CM

## 2018-12-27 DIAGNOSIS — F32A Depression, unspecified: Secondary | ICD-10-CM

## 2018-12-27 DIAGNOSIS — F419 Anxiety disorder, unspecified: Secondary | ICD-10-CM | POA: Diagnosis not present

## 2018-12-27 DIAGNOSIS — R69 Illness, unspecified: Secondary | ICD-10-CM | POA: Diagnosis not present

## 2018-12-27 DIAGNOSIS — R739 Hyperglycemia, unspecified: Secondary | ICD-10-CM

## 2018-12-27 DIAGNOSIS — D649 Anemia, unspecified: Secondary | ICD-10-CM | POA: Diagnosis not present

## 2018-12-27 LAB — CBC WITH DIFFERENTIAL/PLATELET
Basophils Absolute: 0 10*3/uL (ref 0.0–0.1)
Basophils Relative: 0.8 % (ref 0.0–3.0)
Eosinophils Absolute: 0.1 10*3/uL (ref 0.0–0.7)
Eosinophils Relative: 2.5 % (ref 0.0–5.0)
HCT: 38.7 % — ABNORMAL LOW (ref 39.0–52.0)
Hemoglobin: 12.8 g/dL — ABNORMAL LOW (ref 13.0–17.0)
Lymphocytes Relative: 24.9 % (ref 12.0–46.0)
Lymphs Abs: 1.5 10*3/uL (ref 0.7–4.0)
MCHC: 33.2 g/dL (ref 30.0–36.0)
MCV: 83.2 fl (ref 78.0–100.0)
Monocytes Absolute: 0.4 10*3/uL (ref 0.1–1.0)
Monocytes Relative: 7 % (ref 3.0–12.0)
Neutro Abs: 3.8 10*3/uL (ref 1.4–7.7)
Neutrophils Relative %: 64.8 % (ref 43.0–77.0)
Platelets: 266 10*3/uL (ref 150.0–400.0)
RBC: 4.65 Mil/uL (ref 4.22–5.81)
RDW: 14.3 % (ref 11.5–15.5)
WBC: 5.9 10*3/uL (ref 4.0–10.5)

## 2018-12-27 LAB — COMPREHENSIVE METABOLIC PANEL
ALT: 19 U/L (ref 0–53)
AST: 17 U/L (ref 0–37)
Albumin: 3.7 g/dL (ref 3.5–5.2)
Alkaline Phosphatase: 67 U/L (ref 39–117)
BUN: 20 mg/dL (ref 6–23)
CO2: 28 mEq/L (ref 19–32)
Calcium: 9 mg/dL (ref 8.4–10.5)
Chloride: 112 mEq/L (ref 96–112)
Creatinine, Ser: 0.96 mg/dL (ref 0.40–1.50)
GFR: 100.92 mL/min (ref >60.00)
Glucose, Bld: 98 mg/dL (ref 70–99)
Potassium: 4.3 mEq/L (ref 3.5–5.1)
Sodium: 146 mEq/L — ABNORMAL HIGH (ref 135–145)
Total Bilirubin: 0.7 mg/dL (ref 0.2–1.2)
Total Protein: 5.6 g/dL — ABNORMAL LOW (ref 6.0–8.3)

## 2018-12-27 LAB — LIPID PANEL
Cholesterol: 97 mg/dL (ref 0–200)
HDL: 42.5 mg/dL (ref >39.00)
LDL Cholesterol: 47 mg/dL (ref 0–99)
NonHDL: 54.93
Total CHOL/HDL Ratio: 2
Triglycerides: 38 mg/dL (ref 0.0–149.0)
VLDL: 7.6 mg/dL (ref 0.0–40.0)

## 2018-12-27 MED ORDER — AZELASTINE HCL 0.1 % NA SOLN: 2.0000 | Freq: Every evening | NASAL | 3 refills | Status: DC | PRN

## 2018-12-27 NOTE — Progress Notes (Signed)
Subjective:    Patient ID: Lonnie [name], male    DOB: April 20, 1951, 68 y.o.   MRN: [id number]  DOS:  12/27/2018 Type of visit - description: Routine visit  Anxiety depression: See last visit.  Since then he stopped Wellbutrin, he is feeling about the same, "nothing has changed" although specifically he mentions no suicidal or homicidal ideas. HTN: Good med compliance, no ambulatory BPs High cholesterol: Good med compliance, he is fasting today. Tobacco abuse: Not ready to quit  BP Readings from Last 3 Encounters:  12/27/18 (!) 168/92  09/09/18 138/80  08/03/18 (!) 147/87    Review of Systems See above Denies chest pain or difficulty breathing No lower extremity edema No nausea or vomiting  Past Medical History:  Diagnosis Date  . Allergy   . CAD (coronary artery disease)    a. anterior STEMI 10/2013 s/p 4V CABG with LIMA to mid LAD, SVG to OM, SVG to PDA, SVG to Diagonal.  . Hypertension   . Ischemic cardiomyopathy    a. EF 40-45% at time of CABG and in 2018.  . MI (myocardial infarction) (HCC)   . Prediabetes 09/01/2014  . PVD (peripheral vascular disease) (HCC)    a. s/p L SFA stents with now known bilateral SFA occlusion treated medically.  . Tobacco abuse     Past Surgical History:  Procedure Laterality Date  . ABDOMINAL AORTAGRAM N/A 06/07/2014   Procedure: ABDOMINAL Ronny Flurry;  Surgeon: Iran Ouch, MD;  Location: [facility] CATH LAB;  Service: Cardiovascular;  Laterality: N/A;  . ABDOMINAL AORTAGRAM N/A 03/07/2015   Procedure: ABDOMINAL Ronny Flurry;  Surgeon: Iran Ouch, MD;  Location: [facility] CATH LAB;  Service: Cardiovascular;  Laterality: N/A;  . APPENDECTOMY    . COLONOSCOPY WITH PROPOFOL N/A 10/11/2015   Procedure: COLONOSCOPY WITH PROPOFOL;  Surgeon: Charna Elizabeth, MD;  Location: [facility] ENDOSCOPY;  Service: Endoscopy;  Laterality: N/A;  . CORONARY ARTERY BYPASS GRAFT N/A 11/04/2013   Procedure: CORONARY ARTERY BYPASS GRAFTING (CABG) TIMES FOUR  USING LEFT INTERNAL  MAMMARY ARTERY AND RIGHT AND LEFT SAPHENOUS LEG VEIN HARVESTED ENDOSCOPICALLY;  Surgeon: Loreli Slot, MD;  Location: [facility] OR;  Service: Open Heart Surgery;  Laterality: N/A;  . FINGER SURGERY  2017   injury  . KNEE SURGERY     fractured patella  . LEFT HEART CATH N/A 10/29/2013   Procedure: LEFT HEART CATH;  Surgeon: Kathleene Hazel, MD;  Location: [facility] CATH LAB;  Service: Cardiovascular;  Laterality: N/A;  . LEFT HEART CATHETERIZATION WITH CORONARY ANGIOGRAM N/A 10/31/2013   Procedure: LEFT HEART CATHETERIZATION WITH CORONARY ANGIOGRAM;  Surgeon: Kathleene Hazel, MD;  Location: [facility] CATH LAB;  Service: Cardiovascular;  Laterality: N/A;  . MOUTH SURGERY    . TOE SURGERY      Social History   Socioeconomic History  . Marital status: Married    Spouse name: [name]  . Number of children: 1  . Years of education: Not on file  . Highest education level: Not on file  Occupational History  . Occupation: long term disability --Event organiser: [employer]  Social Needs  . Financial resource strain: Not on file  . Food insecurity:    Worry: Not on file    Inability: Not on file  . Transportation needs:    Medical: Not on file    Non-medical: Not on file  Tobacco Use  . Smoking status: Current Every Day Smoker    Packs/day: 0.25    Years: 30.00  Pack years: 7.50    Types: Cigarettes  . Smokeless tobacco: Never Used  . Tobacco comment: < 1/2 ppd  Substance and Sexual Activity  . Alcohol use: No    Alcohol/week: 0.0 standard drinks  . Drug use: No  . Sexual activity: Yes  Lifestyle  . Physical activity:    Days per week: Not on file    Minutes per session: Not on file  . Stress: Not on file  Relationships  . Social connections:    Talks on phone: Not on file    Gets together: Not on file    Attends religious service: Not on file    Active member of club or organization: Not on file    Attends meetings of clubs or organizations: Not on file     Relationship status: Not on file  . Intimate partner violence:    Fear of current or ex partner: Not on file    Emotionally abused: Not on file    Physically abused: Not on file    Forced sexual activity: Not on file  Other Topics Concern  . Not on file  Social History Narrative   Household-- pt and wife   1 daughter       Allergies as of 12/27/2018      Reactions   Ace Inhibitors Swelling   Angioedema   Dairy Aid [lactase]    gas   Eggs Or Egg-derived Products    Cannot eat Prepared Eggs   Peanut-containing Drug Products Nausea And Vomiting   Does not know which nuts, but states nuts make him vomit      Medication List       Accurate as of December 27, 2018 11:59 PM. Always use your most recent med list.        Acai Berry 500 MG Caps Take 2 capsules by mouth 2 (two) times daily.   aspirin EC 81 MG tablet Take 1 tablet (81 mg total) by mouth daily.   atorvastatin 80 MG tablet Commonly known as:  LIPITOR TAKE 1 TABLET BY MOUTH  DAILY AT 6 PM   azelastine 0.1 % nasal spray Commonly known as:  ASTELIN Place 2 sprays into both nostrils at bedtime as needed for rhinitis or allergies. Use in each nostril as directed   CALCIUM 600+D 600-800 MG-UNIT Tabs Generic drug:  Calcium Carb-Cholecalciferol Take 1 tablet by mouth daily.   cilostazol 100 MG tablet Commonly known as:  PLETAL Take 1 tablet (100 mg total) by mouth 2 (two) times daily.   irbesartan 300 MG tablet Commonly known as:  AVAPRO Take 1 tablet (300 mg total) by mouth daily.   L-Arginine 500 MG Caps Take 1 capsule by mouth daily.   metoprolol tartrate 25 MG tablet Commonly known as:  LOPRESSOR Take 1 tablet (25 mg total) by mouth 2 (two) times daily.   OVER THE COUNTER MEDICATION Take 1 capsule by mouth 2 (two) times daily.   OVER THE COUNTER MEDICATION Take 1 tablet by mouth daily.           Objective:   Physical Exam BP (!) 168/92 (BP Location: Left Arm, Patient Position: Sitting, Cuff  Size: Small)   Pulse (!) 52   Temp 98.8 F (37.1 C) (Oral)   Resp 16   Ht 6\' 2"  (1.88 m)   Wt 183 lb 8 oz (83.2 kg)   SpO2 96%   BMI 23.56 kg/m  General:   Well developed, NAD, BMI noted. HEENT:  Normocephalic .  Face symmetric, atraumatic Lungs:  CTA B Normal respiratory effort, no intercostal retractions, no accessory muscle use. Heart: RRR,  no murmur.  No pretibial edema bilaterally  Skin: Not pale. Not jaundice Neurologic:  alert & oriented X3.  Speech normal, gait appropriate for age and unassisted Psych--  Cognition and judgment appear intact.  Cooperative with normal attention span and concentration.  Behavior appropriate. No anxious or depressed appearing.      Assessment     Assessment Prediabetes  dx 08-2014 Feet care; saw podiatry 2016 for calluses  HTN Anxiety: per pt, see OV 09/10/2019 CV: --CAD, MI , cath 10-2013 ---> CABG --Ischemic cardiomyopathy --Peripheral vascular disease Tobacco abuse -- intolerant to chantix before, tried Wellbutrin 2016 (not much help) Onychomycosis-- rx lamisil per podiatry 12-2015  PLAN: Behavioral issues, anxiety and depression: See last office visit, since then he stopped Wellbutrin, things are about the same, did not reach out for psychiatrist or counseling.  "I am not going to do that".  When referring to the source of his stress history "You know the person who is doing that, my wife". He denies s/i, h/i. Addendum: Few days after the visit I spoke about the case with one of our counselors, a referral to a psychologist for some psychometrics including evaluation for subtle dementia is a suggestion (Dr Jason Fila would be a good resource).  Will discuss with the patient at the next opportunity HTN:  Controlled?  BP slightly elevated today, no change for now, continue Avapro, metoprolol.  Check a CMP, CBC. High cholesterol: On Lipitor, check a FLP. CAD, peripheral vascular disease: No symptoms, continue present care. Had a flu  shot RTC 4 to 6 months, CPX.

## 2018-12-27 NOTE — Patient Instructions (Signed)
GO TO THE LAB : Get the blood work     GO TO THE FRONT DESK Schedule your next appointment for a physical exam in 4-6 months   For nasal congestion: Use OTC   Flonase : 2 nasal sprays on each side of the nose in the morning until you feel better Use ASTELIN a prescribed spray : 2 nasal sprays on each side of the nose at night until you feel better

## 2018-12-27 NOTE — Progress Notes (Signed)
Pre visit review using our clinic review tool, if applicable. No additional management support is needed unless otherwise documented below in the visit note. 

## 2018-12-31 NOTE — Assessment & Plan Note (Signed)
Behavioral issues, anxiety and depression: See last office visit, since then he stopped Wellbutrin, things are about the same, did not reach out for psychiatrist or counseling.  "I am not going to do that".  When referring to the source of his stress history "You know the person who is doing that, my wife". He denies s/i, h/i. Addendum: Few days after the visit I spoke about the case with one of our counselors, a referral to a psychologist for some psychometrics including evaluation for subtle dementia is a suggestion (Dr Jason Fila would be a good resource).  Will discuss with the patient at the next opportunity HTN:  Controlled?  BP slightly elevated today, no change for now, continue Avapro, metoprolol.  Check a CMP, CBC. High cholesterol: On Lipitor, check a FLP. CAD, peripheral vascular disease: No symptoms, continue present care. Had a flu shot RTC 4 to 6 months, CPX.

## 2019-01-03 ENCOUNTER — Telehealth: Payer: Self-pay

## 2019-01-03 NOTE — Addendum Note (Signed)
Addended byConrad [place] D on: 01/03/2019 01:45 PM   Modules accepted: Orders

## 2019-01-03 NOTE — Telephone Encounter (Signed)
Pt. Given lab results.Will call back to schedule a lab appointment.

## 2019-01-06 ENCOUNTER — Telehealth: Payer: Self-pay | Admitting: Cardiovascular Disease

## 2019-01-06 ENCOUNTER — Other Ambulatory Visit: Payer: Self-pay

## 2019-01-06 MED ORDER — IRBESARTAN 300 MG PO TABS: 300.0000 mg | ORAL_TABLET | Freq: Every day | ORAL | 2 refills | Status: DC

## 2019-01-06 NOTE — Telephone Encounter (Signed)
  Pt c/o medication issue:  1. Name of Medication: irbesartan (AVAPRO) 300 MG tablet and candesartan  2. How are you currently taking this medication (dosage and times per day)?  3. Are you having a reaction (difficulty breathing--STAT)?  No  4. What is your medication issue? Pharmacy is questioning an order for candesartan since the patient is already on irbesartan

## 2019-01-06 NOTE — Telephone Encounter (Signed)
Clarification given to Pharmacists pt is on Irbesartan 300 mg not Candesartan per office note from JUne 2019 with Dayna Dunn .Zack Seal

## 2019-01-14 ENCOUNTER — Other Ambulatory Visit: Payer: Self-pay | Admitting: Internal Medicine

## 2019-05-24 ENCOUNTER — Telehealth: Payer: Self-pay | Admitting: *Deleted

## 2019-05-24 NOTE — Telephone Encounter (Signed)
Lonnie [name], stated,refused to go to any doctor's office,due to his family member is a PA at [employer] and gave Covid 19 to his or her mother.

## 2019-06-20 ENCOUNTER — Ambulatory Visit (INDEPENDENT_AMBULATORY_CARE_PROVIDER_SITE_OTHER): Payer: Medicare HMO | Admitting: Family Medicine

## 2019-06-20 ENCOUNTER — Other Ambulatory Visit: Payer: Self-pay

## 2019-06-20 ENCOUNTER — Telehealth: Payer: Self-pay | Admitting: Cardiovascular Disease

## 2019-06-20 ENCOUNTER — Encounter: Payer: Self-pay | Admitting: Family Medicine

## 2019-06-20 VITALS — BP 142/100 | HR 59 | Temp 98.3°F

## 2019-06-20 DIAGNOSIS — R2681 Unsteadiness on feet: Secondary | ICD-10-CM

## 2019-06-20 DIAGNOSIS — H938X3 Other specified disorders of ear, bilateral: Secondary | ICD-10-CM | POA: Diagnosis not present

## 2019-06-20 DIAGNOSIS — I739 Peripheral vascular disease, unspecified: Secondary | ICD-10-CM

## 2019-06-20 MED ORDER — PREDNISONE 20 MG PO TABS: 40.0000 mg | ORAL_TABLET | Freq: Every day | ORAL | 0 refills | Status: AC

## 2019-06-20 NOTE — Patient Instructions (Addendum)
Stay hydrated. Consider drinking things with electrolytes. Get up slowly.  You don't have much wax. We will treat for eustachian tube dysfunction.   Please reach out if anything changes.   Let us know if you need anything.

## 2019-06-20 NOTE — Telephone Encounter (Signed)
Spoke with the wife and she would like to know if the patient can have an LEA/ABI prior to an appointment since it has been several years since one has been completed.

## 2019-06-20 NOTE — Telephone Encounter (Signed)
Recommend an appointment with APP to evaluate.

## 2019-06-20 NOTE — Progress Notes (Signed)
Chief Complaint  Patient presents with  . Fatigue    Subjective: Patient is a 68 y.o. male here for weakness. Here w wife.   Has felt unsteady for the last mo, getting worse. He has fallen. Last week most recent. Comes and goes. Lasts for around 3-5 min. Happens when he walks. 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 +ear pain on both sides, no drainage from ears. Has been using peroxide b/l without relief. Feels like they are clogged. Nothing is draining. Denies recent med changes, PO intake changes, illness, blood in stool/urine, fevers, sob, cough.    ROS: Const: No fevers Lungs: Denies SOB   Past Medical History:  Diagnosis Date  . Allergy   . CAD (coronary artery disease)    a. anterior STEMI 10/2013 s/p 4V CABG with LIMA to mid LAD, SVG to OM, SVG to PDA, SVG to Diagonal.  . Hypertension   . Ischemic cardiomyopathy    a. EF 40-45% at time of CABG and in 2018.  . MI (myocardial infarction) ([place])   . Prediabetes 09/01/2014  . PVD (peripheral vascular disease) ([place])    a. s/p L SFA stents with now known bilateral SFA occlusion treated medically.  . Tobacco abuse     Objective: BP (!) 156/77 (BP Location: Left Arm, Patient Position: Sitting, Cuff Size: Normal)   Pulse (!) 59   Temp 98.3 F (36.8 C) (Oral)   SpO2 99%  General: Awake, appears stated age HEENT: MMM, EOMi, ears neg b/l Heart: RRR, no bruits Lungs: CTAB, no rales, wheezes or rhonchi. No accessory muscle use Psych: normal affect and mood  Assessment and Plan: Unsteadiness - Plan: suspect orthostasis. Orthostatic blood pressures not definitive of orthostatic hypotension.   Sensation of fullness in both ears - Plan: predniSONE (DELTASONE) 20 MG tablet, 5 d pred burst for assumed ETD  Orders as above. Has not had BP meds this AM. Will defer changing meds to reg PCP.  The patient voiced understanding and agreement to the plan.  [place], DO 06/20/19  12:22 PM

## 2019-06-20 NOTE — Telephone Encounter (Signed)
Spoke to pt's wife who report pt has had several falls over the last 2 weeks. She state pt was complaining about an inner ear issue and they that maybe that could have been the cause and making him off balance. Wife report pt was seen by his pcp and they stated his ears were fine.   Wife state the but doesn't report feeling dizzy or light headed but report legs are giving out. Wife state she feels it could be related to poor circulations and report pt is smoking more than he ever have. Wife also report pt has a bruise in the are where his stent was placed.  Will route to MD for recommendations.
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
## 2019-06-20 NOTE — Telephone Encounter (Signed)
That is fine 
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
## 2019-06-27 ENCOUNTER — Ambulatory Visit: Payer: Medicare HMO | Admitting: Internal Medicine

## 2019-06-27 ENCOUNTER — Telehealth: Payer: Self-pay | Admitting: Internal Medicine
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
## 2019-06-27 NOTE — Telephone Encounter (Signed)
Noted, has a history of anxiety, depression and behavioral issues, see OV note from January 2020,

## 2019-06-27 NOTE — Telephone Encounter (Signed)
FYI

## 2019-06-28 ENCOUNTER — Other Ambulatory Visit: Payer: Self-pay

## 2019-06-28 ENCOUNTER — Other Ambulatory Visit (HOSPITAL_COMMUNITY): Payer: Self-pay | Admitting: Cardiovascular Disease

## 2019-06-28 ENCOUNTER — Ambulatory Visit (HOSPITAL_BASED_OUTPATIENT_CLINIC_OR_DEPARTMENT_OTHER)
Admission: RE | Admit: 2019-06-28 | Discharge: 2019-06-28 | Disposition: A | Discharge status: Home / Self Care | Payer: Medicare HMO | Source: Ambulatory Visit | Attending: Cardiovascular Disease | Admitting: Cardiovascular Disease

## 2019-06-28 DIAGNOSIS — I739 Peripheral vascular disease, unspecified: Secondary | ICD-10-CM

## 2019-06-28 DIAGNOSIS — S065X0A Traumatic subdural hemorrhage without loss of consciousness, initial encounter: Secondary | ICD-10-CM | POA: Diagnosis not present

## 2019-06-28 DIAGNOSIS — Z95820 Peripheral vascular angioplasty status with implants and grafts: Secondary | ICD-10-CM

## 2019-06-28 DIAGNOSIS — Z20828 Contact with and (suspected) exposure to other viral communicable diseases: Secondary | ICD-10-CM | POA: Diagnosis not present

## 2019-06-28 DIAGNOSIS — R51 Headache: Secondary | ICD-10-CM | POA: Diagnosis not present

## 2019-06-28 DIAGNOSIS — S065X9A Traumatic subdural hemorrhage with loss of consciousness of unspecified duration, initial encounter: Secondary | ICD-10-CM | POA: Diagnosis not present

## 2019-06-30 ENCOUNTER — Encounter: Payer: Self-pay | Admitting: Internal Medicine

## 2019-06-30 ENCOUNTER — Inpatient Hospital Stay (HOSPITAL_COMMUNITY): Payer: Medicare HMO | Admitting: Certified Registered"

## 2019-06-30 ENCOUNTER — Inpatient Hospital Stay (HOSPITAL_COMMUNITY)
Admission: EM | Admit: 2019-06-30 | Discharge: 2019-07-08 | DRG: 026 | Disposition: A | Discharge status: Rehabilitation Facility | Payer: Medicare HMO | Source: Ambulatory Visit | Attending: Neurosurgery | Admitting: Neurosurgery

## 2019-06-30 ENCOUNTER — Ambulatory Visit (INDEPENDENT_AMBULATORY_CARE_PROVIDER_SITE_OTHER): Payer: Medicare HMO | Admitting: Internal Medicine

## 2019-06-30 ENCOUNTER — Other Ambulatory Visit: Payer: Self-pay

## 2019-06-30 ENCOUNTER — Encounter (HOSPITAL_COMMUNITY): Payer: Self-pay | Admitting: Certified Registered"

## 2019-06-30 ENCOUNTER — Ambulatory Visit (HOSPITAL_BASED_OUTPATIENT_CLINIC_OR_DEPARTMENT_OTHER)
Admission: RE | Admit: 2019-06-30 | Discharge: 2019-06-30 | Disposition: A | Discharge status: Home / Self Care | Payer: Medicare HMO | Source: Ambulatory Visit | Attending: Internal Medicine | Admitting: Internal Medicine

## 2019-06-30 ENCOUNTER — Encounter (HOSPITAL_COMMUNITY)
Admission: EM | Disposition: A | Discharge status: Rehabilitation Facility | Payer: Self-pay | Source: Home / Self Care | Attending: Neurosurgery

## 2019-06-30 VITALS — BP 135/74 | HR 49 | Temp 98.2°F | Resp 16 | Ht 74.0 in | Wt 170.1 lb

## 2019-06-30 DIAGNOSIS — G8918 Other acute postprocedural pain: Secondary | ICD-10-CM | POA: Diagnosis not present

## 2019-06-30 DIAGNOSIS — I2510 Atherosclerotic heart disease of native coronary artery without angina pectoris: Secondary | ICD-10-CM | POA: Diagnosis not present

## 2019-06-30 DIAGNOSIS — I252 Old myocardial infarction: Secondary | ICD-10-CM

## 2019-06-30 DIAGNOSIS — Z91010 Allergy to peanuts: Secondary | ICD-10-CM

## 2019-06-30 DIAGNOSIS — I2583 Coronary atherosclerosis due to lipid rich plaque: Secondary | ICD-10-CM | POA: Diagnosis not present

## 2019-06-30 DIAGNOSIS — K5900 Constipation, unspecified: Secondary | ICD-10-CM | POA: Diagnosis not present

## 2019-06-30 DIAGNOSIS — N390 Urinary tract infection, site not specified: Secondary | ICD-10-CM | POA: Diagnosis not present

## 2019-06-30 DIAGNOSIS — R350 Frequency of micturition: Secondary | ICD-10-CM | POA: Diagnosis present

## 2019-06-30 DIAGNOSIS — Z823 Family history of stroke: Secondary | ICD-10-CM

## 2019-06-30 DIAGNOSIS — R451 Restlessness and agitation: Secondary | ICD-10-CM | POA: Diagnosis not present

## 2019-06-30 DIAGNOSIS — F329 Major depressive disorder, single episode, unspecified: Secondary | ICD-10-CM | POA: Diagnosis present

## 2019-06-30 DIAGNOSIS — F17210 Nicotine dependence, cigarettes, uncomplicated: Secondary | ICD-10-CM | POA: Insufficient documentation

## 2019-06-30 DIAGNOSIS — R4182 Altered mental status, unspecified: Secondary | ICD-10-CM | POA: Diagnosis not present

## 2019-06-30 DIAGNOSIS — I739 Peripheral vascular disease, unspecified: Secondary | ICD-10-CM | POA: Diagnosis present

## 2019-06-30 DIAGNOSIS — R296 Repeated falls: Secondary | ICD-10-CM | POA: Diagnosis present

## 2019-06-30 DIAGNOSIS — Z833 Family history of diabetes mellitus: Secondary | ICD-10-CM | POA: Diagnosis not present

## 2019-06-30 DIAGNOSIS — S0990XA Unspecified injury of head, initial encounter: Secondary | ICD-10-CM | POA: Diagnosis not present

## 2019-06-30 DIAGNOSIS — Z95820 Peripheral vascular angioplasty status with implants and grafts: Secondary | ICD-10-CM | POA: Diagnosis not present

## 2019-06-30 DIAGNOSIS — Z951 Presence of aortocoronary bypass graft: Secondary | ICD-10-CM

## 2019-06-30 DIAGNOSIS — I509 Heart failure, unspecified: Secondary | ICD-10-CM | POA: Diagnosis not present

## 2019-06-30 DIAGNOSIS — I6782 Cerebral ischemia: Secondary | ICD-10-CM | POA: Insufficient documentation

## 2019-06-30 DIAGNOSIS — I110 Hypertensive heart disease with heart failure: Secondary | ICD-10-CM | POA: Diagnosis not present

## 2019-06-30 DIAGNOSIS — R269 Unspecified abnormalities of gait and mobility: Secondary | ICD-10-CM | POA: Diagnosis not present

## 2019-06-30 DIAGNOSIS — Z91012 Allergy to eggs: Secondary | ICD-10-CM | POA: Diagnosis not present

## 2019-06-30 DIAGNOSIS — S065XAA Traumatic subdural hemorrhage with loss of consciousness status unknown, initial encounter: Secondary | ICD-10-CM | POA: Diagnosis present

## 2019-06-30 DIAGNOSIS — R001 Bradycardia, unspecified: Secondary | ICD-10-CM | POA: Diagnosis present

## 2019-06-30 DIAGNOSIS — G8191 Hemiplegia, unspecified affecting right dominant side: Secondary | ICD-10-CM | POA: Diagnosis not present

## 2019-06-30 DIAGNOSIS — S065X9A Traumatic subdural hemorrhage with loss of consciousness of unspecified duration, initial encounter: Principal | ICD-10-CM | POA: Diagnosis present

## 2019-06-30 DIAGNOSIS — R51 Headache: Secondary | ICD-10-CM | POA: Diagnosis not present

## 2019-06-30 DIAGNOSIS — Z20828 Contact with and (suspected) exposure to other viral communicable diseases: Secondary | ICD-10-CM | POA: Diagnosis not present

## 2019-06-30 DIAGNOSIS — F0281 Dementia in other diseases classified elsewhere with behavioral disturbance: Secondary | ICD-10-CM | POA: Diagnosis not present

## 2019-06-30 DIAGNOSIS — K5901 Slow transit constipation: Secondary | ICD-10-CM | POA: Diagnosis not present

## 2019-06-30 DIAGNOSIS — Z7982 Long term (current) use of aspirin: Secondary | ICD-10-CM | POA: Diagnosis not present

## 2019-06-30 DIAGNOSIS — Z8249 Family history of ischemic heart disease and other diseases of the circulatory system: Secondary | ICD-10-CM | POA: Diagnosis not present

## 2019-06-30 DIAGNOSIS — S065X0A Traumatic subdural hemorrhage without loss of consciousness, initial encounter: Secondary | ICD-10-CM | POA: Diagnosis not present

## 2019-06-30 DIAGNOSIS — Y92410 Unspecified street and highway as the place of occurrence of the external cause: Secondary | ICD-10-CM | POA: Diagnosis not present

## 2019-06-30 DIAGNOSIS — S069X0S Unspecified intracranial injury without loss of consciousness, sequela: Secondary | ICD-10-CM | POA: Diagnosis not present

## 2019-06-30 DIAGNOSIS — F419 Anxiety disorder, unspecified: Secondary | ICD-10-CM | POA: Diagnosis present

## 2019-06-30 DIAGNOSIS — Z1159 Encounter for screening for other viral diseases: Secondary | ICD-10-CM | POA: Diagnosis not present

## 2019-06-30 DIAGNOSIS — Z888 Allergy status to other drugs, medicaments and biological substances status: Secondary | ICD-10-CM | POA: Diagnosis not present

## 2019-06-30 DIAGNOSIS — R7303 Prediabetes: Secondary | ICD-10-CM | POA: Diagnosis present

## 2019-06-30 DIAGNOSIS — R739 Hyperglycemia, unspecified: Secondary | ICD-10-CM

## 2019-06-30 DIAGNOSIS — G3109 Other frontotemporal dementia: Secondary | ICD-10-CM | POA: Diagnosis not present

## 2019-06-30 DIAGNOSIS — F418 Other specified anxiety disorders: Secondary | ICD-10-CM | POA: Diagnosis present

## 2019-06-30 DIAGNOSIS — I6202 Nontraumatic subacute subdural hemorrhage: Secondary | ICD-10-CM | POA: Diagnosis not present

## 2019-06-30 DIAGNOSIS — T83511D Infection and inflammatory reaction due to indwelling urethral catheter, subsequent encounter: Secondary | ICD-10-CM | POA: Diagnosis not present

## 2019-06-30 DIAGNOSIS — Z8261 Family history of arthritis: Secondary | ICD-10-CM

## 2019-06-30 DIAGNOSIS — R0989 Other specified symptoms and signs involving the circulatory and respiratory systems: Secondary | ICD-10-CM | POA: Diagnosis not present

## 2019-06-30 DIAGNOSIS — S069X0A Unspecified intracranial injury without loss of consciousness, initial encounter: Secondary | ICD-10-CM | POA: Diagnosis not present

## 2019-06-30 DIAGNOSIS — I10 Essential (primary) hypertension: Secondary | ICD-10-CM | POA: Diagnosis present

## 2019-06-30 DIAGNOSIS — Z811 Family history of alcohol abuse and dependence: Secondary | ICD-10-CM | POA: Diagnosis not present

## 2019-06-30 DIAGNOSIS — M25512 Pain in left shoulder: Secondary | ICD-10-CM | POA: Diagnosis not present

## 2019-06-30 DIAGNOSIS — R69 Illness, unspecified: Secondary | ICD-10-CM | POA: Diagnosis not present

## 2019-06-30 DIAGNOSIS — S065X0S Traumatic subdural hemorrhage without loss of consciousness, sequela: Secondary | ICD-10-CM | POA: Diagnosis not present

## 2019-06-30 DIAGNOSIS — G9389 Other specified disorders of brain: Secondary | ICD-10-CM | POA: Diagnosis present

## 2019-06-30 DIAGNOSIS — R339 Retention of urine, unspecified: Secondary | ICD-10-CM | POA: Diagnosis not present

## 2019-06-30 DIAGNOSIS — Z79899 Other long term (current) drug therapy: Secondary | ICD-10-CM

## 2019-06-30 DIAGNOSIS — I255 Ischemic cardiomyopathy: Secondary | ICD-10-CM | POA: Diagnosis present

## 2019-06-30 DIAGNOSIS — I6200 Nontraumatic subdural hemorrhage, unspecified: Secondary | ICD-10-CM | POA: Diagnosis not present

## 2019-06-30 HISTORY — PX: CRANIOTOMY: SHX93

## 2019-06-30 LAB — CBC WITH DIFFERENTIAL/PLATELET
Abs Immature Granulocytes: 0.23 10*3/uL — ABNORMAL HIGH (ref 0.00–0.07)
Basophils Absolute: 0.1 10*3/uL (ref 0.0–0.1)
Basophils Absolute: 0.1 10*3/uL (ref 0.0–0.1)
Basophils Relative: 0.9 % (ref 0.0–3.0)
Basophils Relative: 1 %
Eosinophils Absolute: 0.4 10*3/uL (ref 0.0–0.7)
Eosinophils Absolute: 0.3 10*3/uL (ref 0.0–0.5)
Eosinophils Relative: 3.8 % (ref 0.0–5.0)
Eosinophils Relative: 3 %
HCT: 45.2 % (ref 39.0–52.0)
HCT: 48.5 % (ref 39.0–52.0)
Hemoglobin: 15.2 g/dL (ref 13.0–17.0)
Hemoglobin: 15.5 g/dL (ref 13.0–17.0)
Immature Granulocytes: 2 %
Lymphocytes Relative: 20.9 % (ref 12.0–46.0)
Lymphocytes Relative: 26 %
Lymphs Abs: 2.2 10*3/uL (ref 0.7–4.0)
Lymphs Abs: 2.7 10*3/uL (ref 0.7–4.0)
MCH: 27.5 pg (ref 26.0–34.0)
MCHC: 33.6 g/dL (ref 30.0–36.0)
MCHC: 32 g/dL (ref 30.0–36.0)
MCV: 84 fl (ref 78.0–100.0)
MCV: 86.1 fL (ref 80.0–100.0)
Monocytes Absolute: 0.7 10*3/uL (ref 0.1–1.0)
Monocytes Absolute: 0.7 10*3/uL (ref 0.1–1.0)
Monocytes Relative: 6.5 % (ref 3.0–12.0)
Monocytes Relative: 7 %
Neutro Abs: 7.1 10*3/uL (ref 1.4–7.7)
Neutro Abs: 6.2 10*3/uL (ref 1.7–7.7)
Neutrophils Relative %: 67.9 % (ref 43.0–77.0)
Neutrophils Relative %: 61 %
Platelets: 299 10*3/uL (ref 150.0–400.0)
Platelets: 287 10*3/uL (ref 150–400)
RBC: 5.38 Mil/uL (ref 4.22–5.81)
RBC: 5.63 MIL/uL (ref 4.22–5.81)
RDW: 14.1 % (ref 11.5–15.5)
RDW: 13.5 % (ref 11.5–15.5)
WBC: 10.5 10*3/uL (ref 4.0–10.5)
WBC: 10.2 10*3/uL (ref 4.0–10.5)
nRBC: 0 % (ref 0.0–0.2)

## 2019-06-30 LAB — COMPREHENSIVE METABOLIC PANEL
ALT: 16 U/L (ref 0–53)
ALT: 19 U/L (ref 0–44)
AST: 12 U/L (ref 0–37)
AST: 18 U/L (ref 15–41)
Albumin: 4.1 g/dL (ref 3.5–5.2)
Albumin: 3.8 g/dL (ref 3.5–5.0)
Alkaline Phosphatase: 82 U/L (ref 39–117)
Alkaline Phosphatase: 75 U/L (ref 38–126)
Anion gap: 9 (ref 5–15)
BUN: 24 mg/dL — ABNORMAL HIGH (ref 6–23)
BUN: 19 mg/dL (ref 8–23)
CO2: 24 mEq/L (ref 19–32)
CO2: 23 mmol/L (ref 22–32)
Calcium: 9.2 mg/dL (ref 8.4–10.5)
Calcium: 9.3 mg/dL (ref 8.9–10.3)
Chloride: 105 mEq/L (ref 96–112)
Chloride: 106 mmol/L (ref 98–111)
Creatinine, Ser: 0.97 mg/dL (ref 0.40–1.50)
Creatinine, Ser: 1.15 mg/dL (ref 0.61–1.24)
GFR calc Af Amer: >60 mL/min (ref >60)
GFR calc non Af Amer: >60 mL/min (ref >60)
GFR: 93.68 mL/min (ref >60.00)
Glucose, Bld: 107 mg/dL — ABNORMAL HIGH (ref 70–99)
Glucose, Bld: 109 mg/dL — ABNORMAL HIGH (ref 70–99)
Potassium: 4.3 mEq/L (ref 3.5–5.1)
Potassium: 4.6 mmol/L (ref 3.5–5.1)
Sodium: 138 mEq/L (ref 135–145)
Sodium: 138 mmol/L (ref 135–145)
Total Bilirubin: 0.8 mg/dL (ref 0.2–1.2)
Total Bilirubin: 1 mg/dL (ref 0.3–1.2)
Total Protein: 6.4 g/dL (ref 6.0–8.3)
Total Protein: 6.4 g/dL — ABNORMAL LOW (ref 6.5–8.1)

## 2019-06-30 LAB — CBG MONITORING, ED: Glucose-Capillary: 90 mg/dL (ref 70–99)

## 2019-06-30 LAB — TYPE AND SCREEN
ABO/RH(D): O POS
Antibody Screen: NEGATIVE

## 2019-06-30 LAB — B12 AND FOLATE PANEL
Folate: 10.1 ng/mL (ref >5.9)
Vitamin B-12: 239 pg/mL (ref 211–911)

## 2019-06-30 LAB — HEMOGLOBIN A1C: Hgb A1c MFr Bld: 6.1 % (ref 4.6–6.5)

## 2019-06-30 LAB — SARS CORONAVIRUS 2 BY RT PCR (HOSPITAL ORDER, PERFORMED IN ~~LOC~~ HOSPITAL LAB): SARS Coronavirus 2: NEGATIVE

## 2019-06-30 LAB — ETHANOL: Alcohol, Ethyl (B): <10 mg/dL (ref <10)

## 2019-06-30 LAB — PROTIME-INR
INR: 1 (ref 0.8–1.2)
Prothrombin Time: 12.8 seconds (ref 11.4–15.2)

## 2019-06-30 LAB — APTT: aPTT: 30 seconds (ref 24–36)
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
## 2019-06-30 SURGERY — CRANIOTOMY HEMATOMA EVACUATION SUBDURAL
Anesthesia: General | Site: Head | Laterality: Left

## 2019-06-30 MED ORDER — PHENYLEPHRINE 40 MCG/ML (10ML) SYRINGE FOR IV PUSH (FOR BLOOD PRESSURE SUPPORT)
PREFILLED_SYRINGE | INTRAVENOUS | Status: AC
  Filled 2019-06-30: qty 10

## 2019-06-30 MED ORDER — SODIUM CHLORIDE 0.9 % IV SOLN
INTRAVENOUS | Status: DC | PRN
  Administered 2019-06-30: 500 mL

## 2019-06-30 MED ORDER — LIDOCAINE-EPINEPHRINE 1 %-1:100000 IJ SOLN
INTRAMUSCULAR | Status: DC | PRN
  Administered 2019-06-30: 5 mL

## 2019-06-30 MED ORDER — GLYCOPYRROLATE PF 0.2 MG/ML IJ SOSY
PREFILLED_SYRINGE | INTRAMUSCULAR | Status: AC
  Filled 2019-06-30: qty 1

## 2019-06-30 MED ORDER — ONDANSETRON HCL 4 MG/2ML IJ SOLN
INTRAMUSCULAR | Status: AC
  Filled 2019-06-30: qty 2

## 2019-06-30 MED ORDER — PROMETHAZINE HCL 25 MG PO TABS
12.5000 mg | ORAL_TABLET | ORAL | Status: DC | PRN
  Administered 2019-07-07: 25 mg via ORAL
  Filled 2019-06-30: qty 2

## 2019-06-30 MED ORDER — CEFAZOLIN SODIUM-DEXTROSE 2-4 GM/100ML-% IV SOLN
INTRAVENOUS | Status: AC
  Filled 2019-06-30: qty 100

## 2019-06-30 MED ORDER — HYDROMORPHONE HCL 1 MG/ML IJ SOLN
0.5000 mg | INTRAMUSCULAR | Status: DC | PRN
  Administered 2019-06-30 – 2019-07-03 (×13): 1 mg via INTRAVENOUS
  Filled 2019-06-30 (×15): qty 1

## 2019-06-30 MED ORDER — THROMBIN 20000 UNITS EX SOLR
CUTANEOUS | Status: AC
  Filled 2019-06-30: qty 20000

## 2019-06-30 MED ORDER — BACITRACIN ZINC 500 UNIT/GM EX OINT
TOPICAL_OINTMENT | CUTANEOUS | Status: AC
  Filled 2019-06-30: qty 28.35

## 2019-06-30 MED ORDER — HYDROCODONE-ACETAMINOPHEN 5-325 MG PO TABS
1.0000 | ORAL_TABLET | ORAL | Status: DC | PRN
  Administered 2019-07-05 – 2019-07-08 (×13): 1 via ORAL
  Filled 2019-06-30 (×14): qty 1

## 2019-06-30 MED ORDER — ONDANSETRON HCL 4 MG/2ML IJ SOLN: 4.0000 mg | INTRAMUSCULAR | Status: DC | PRN

## 2019-06-30 MED ORDER — MIDAZOLAM HCL 2 MG/2ML IJ SOLN
INTRAMUSCULAR | Status: AC
  Filled 2019-06-30: qty 2

## 2019-06-30 MED ORDER — CILOSTAZOL 100 MG PO TABS
100.0000 mg | ORAL_TABLET | Freq: Two times a day (BID) | ORAL | Status: DC
  Filled 2019-06-30 (×7): qty 1

## 2019-06-30 MED ORDER — CALCIUM CARBONATE-VITAMIN D 500-200 MG-UNIT PO TABS
1.0000 | ORAL_TABLET | Freq: Every day | ORAL | Status: DC
  Administered 2019-07-04 – 2019-07-08 (×5): 1 via ORAL
  Filled 2019-06-30 (×6): qty 1

## 2019-06-30 MED ORDER — FENTANYL CITRATE (PF) 100 MCG/2ML IJ SOLN
INTRAMUSCULAR | Status: DC | PRN
  Administered 2019-06-30: 100 ug via INTRAVENOUS

## 2019-06-30 MED ORDER — ATORVASTATIN CALCIUM 80 MG PO TABS
80.0000 mg | ORAL_TABLET | Freq: Every day | ORAL | Status: DC
  Administered 2019-07-02 – 2019-07-07 (×5): 80 mg via ORAL
  Filled 2019-06-30 (×5): qty 1

## 2019-06-30 MED ORDER — THROMBIN 5000 UNITS EX SOLR
CUTANEOUS | Status: AC
  Filled 2019-06-30: qty 5000

## 2019-06-30 MED ORDER — METOPROLOL TARTRATE 25 MG PO TABS
25.0000 mg | ORAL_TABLET | Freq: Two times a day (BID) | ORAL | Status: DC
  Administered 2019-07-02 – 2019-07-08 (×5): 25 mg via ORAL
  Filled 2019-06-30 (×8): qty 1

## 2019-06-30 MED ORDER — LEVETIRACETAM IN NACL 1000 MG/100ML IV SOLN
1000.0000 mg | Freq: Once | INTRAVENOUS | Status: AC
  Administered 2019-06-30: 1000 mg via INTRAVENOUS
  Filled 2019-06-30: qty 100

## 2019-06-30 MED ORDER — SUCCINYLCHOLINE CHLORIDE 200 MG/10ML IV SOSY
PREFILLED_SYRINGE | INTRAVENOUS | Status: AC
  Filled 2019-06-30: qty 10

## 2019-06-30 MED ORDER — IRBESARTAN 300 MG PO TABS
300.0000 mg | ORAL_TABLET | Freq: Every day | ORAL | Status: DC
  Administered 2019-07-02 – 2019-07-08 (×7): 300 mg via ORAL
  Filled 2019-06-30 (×3): qty 1
  Filled 2019-06-30 (×2): qty 2
  Filled 2019-06-30: qty 1
  Filled 2019-06-30 (×2): qty 2
  Filled 2019-06-30 (×3): qty 1
  Filled 2019-06-30 (×3): qty 2
  Filled 2019-06-30 (×2): qty 1

## 2019-06-30 MED ORDER — LEVETIRACETAM IN NACL 500 MG/100ML IV SOLN
500.0000 mg | Freq: Two times a day (BID) | INTRAVENOUS | Status: DC
  Administered 2019-07-01 – 2019-07-03 (×6): 500 mg via INTRAVENOUS
  Filled 2019-06-30 (×8): qty 100

## 2019-06-30 MED ORDER — 0.9 % SODIUM CHLORIDE (POUR BTL) OPTIME
TOPICAL | Status: DC | PRN
  Administered 2019-06-30: 2000 mL

## 2019-06-30 MED ORDER — DEXAMETHASONE SODIUM PHOSPHATE 10 MG/ML IJ SOLN
INTRAMUSCULAR | Status: AC
  Filled 2019-06-30: qty 1

## 2019-06-30 MED ORDER — L-ARGININE 500 MG PO CAPS: 1.0000 | ORAL_CAPSULE | Freq: Every day | ORAL | Status: DC

## 2019-06-30 MED ORDER — THROMBIN 20000 UNITS EX SOLR
CUTANEOUS | Status: DC | PRN
  Administered 2019-06-30: 20 mL

## 2019-06-30 MED ORDER — NALOXONE HCL 0.4 MG/ML IJ SOLN: 0.0800 mg | INTRAMUSCULAR | Status: DC | PRN

## 2019-06-30 MED ORDER — PROPOFOL 10 MG/ML IV BOLUS
INTRAVENOUS | Status: AC
  Filled 2019-06-30: qty 20

## 2019-06-30 MED ORDER — KETAMINE HCL 50 MG/5ML IJ SOSY
PREFILLED_SYRINGE | INTRAMUSCULAR | Status: AC
  Filled 2019-06-30: qty 5

## 2019-06-30 MED ORDER — SODIUM CHLORIDE 0.9 % IV SOLN
INTRAVENOUS | Status: DC
  Administered 2019-06-30 – 2019-07-03 (×2): via INTRAVENOUS

## 2019-06-30 MED ORDER — DEXAMETHASONE SODIUM PHOSPHATE 10 MG/ML IJ SOLN
INTRAMUSCULAR | Status: DC | PRN
  Administered 2019-06-30: 5 mg via INTRAVENOUS

## 2019-06-30 MED ORDER — GLYCOPYRROLATE PF 0.2 MG/ML IJ SOSY
PREFILLED_SYRINGE | INTRAMUSCULAR | Status: DC | PRN
  Administered 2019-06-30: .2 mg via INTRAVENOUS

## 2019-06-30 MED ORDER — BISACODYL 5 MG PO TBEC: 5.0000 mg | DELAYED_RELEASE_TABLET | Freq: Every day | ORAL | Status: DC | PRN

## 2019-06-30 MED ORDER — FENTANYL CITRATE (PF) 250 MCG/5ML IJ SOLN
INTRAMUSCULAR | Status: AC
  Filled 2019-06-30: qty 5

## 2019-06-30 MED ORDER — ROCURONIUM BROMIDE 10 MG/ML (PF) SYRINGE
PREFILLED_SYRINGE | INTRAVENOUS | Status: AC
  Filled 2019-06-30: qty 10

## 2019-06-30 MED ORDER — DOCUSATE SODIUM 100 MG PO CAPS
100.0000 mg | ORAL_CAPSULE | Freq: Two times a day (BID) | ORAL | Status: DC
  Administered 2019-07-04 – 2019-07-08 (×9): 100 mg via ORAL
  Filled 2019-06-30 (×11): qty 1

## 2019-06-30 MED ORDER — ACETAMINOPHEN 325 MG PO TABS: 650.0000 mg | ORAL_TABLET | ORAL | Status: DC | PRN

## 2019-06-30 MED ORDER — CEFAZOLIN SODIUM-DEXTROSE 2-4 GM/100ML-% IV SOLN
2.0000 g | Freq: Three times a day (TID) | INTRAVENOUS | Status: AC
  Administered 2019-06-30 – 2019-07-01 (×2): 2 g via INTRAVENOUS
  Filled 2019-06-30 (×2): qty 100

## 2019-06-30 MED ORDER — SODIUM CHLORIDE 0.9 % IV SOLN
INTRAVENOUS | Status: DC | PRN
  Administered 2019-06-30: 100 ug/min via INTRAVENOUS

## 2019-06-30 MED ORDER — SUCCINYLCHOLINE CHLORIDE 200 MG/10ML IV SOSY
PREFILLED_SYRINGE | INTRAVENOUS | Status: DC | PRN
  Administered 2019-06-30: 120 mg via INTRAVENOUS

## 2019-06-30 MED ORDER — SODIUM CHLORIDE 0.9% FLUSH
3.0000 mL | Freq: Two times a day (BID) | INTRAVENOUS | Status: DC
  Administered 2019-06-30 – 2019-07-08 (×16): 3 mL via INTRAVENOUS

## 2019-06-30 MED ORDER — SUGAMMADEX SODIUM 200 MG/2ML IV SOLN
INTRAVENOUS | Status: DC | PRN
  Administered 2019-06-30: 200 mg via INTRAVENOUS

## 2019-06-30 MED ORDER — SENNOSIDES-DOCUSATE SODIUM 8.6-50 MG PO TABS: 1.0000 | ORAL_TABLET | Freq: Every evening | ORAL | Status: DC | PRN

## 2019-06-30 MED ORDER — ONDANSETRON HCL 4 MG/2ML IJ SOLN
INTRAMUSCULAR | Status: DC | PRN
  Administered 2019-06-30: 4 mg via INTRAVENOUS

## 2019-06-30 MED ORDER — LIDOCAINE-EPINEPHRINE 1 %-1:100000 IJ SOLN
INTRAMUSCULAR | Status: AC
  Filled 2019-06-30: qty 1

## 2019-06-30 MED ORDER — HYDRALAZINE HCL 20 MG/ML IJ SOLN
5.0000 mg | INTRAMUSCULAR | Status: DC | PRN
  Administered 2019-07-01 (×2): 10 mg via INTRAVENOUS
  Administered 2019-07-02: 20 mg via INTRAVENOUS
  Administered 2019-07-03: 15 mg via INTRAVENOUS
  Administered 2019-07-05 – 2019-07-07 (×3): 20 mg via INTRAVENOUS
  Filled 2019-06-30 (×8): qty 1

## 2019-06-30 MED ORDER — SODIUM CHLORIDE 0.9 % IV SOLN
INTRAVENOUS | Status: DC
  Administered 2019-06-30 – 2019-07-03 (×5): via INTRAVENOUS

## 2019-06-30 MED ORDER — DEXTROSE 5 % IV SOLN
3.0000 g | INTRAVENOUS | Status: AC
  Administered 2019-06-30: 3 g via INTRAVENOUS
  Filled 2019-06-30: qty 3000

## 2019-06-30 MED ORDER — FLEET ENEMA 7-19 GM/118ML RE ENEM: 1.0000 | ENEMA | Freq: Once | RECTAL | Status: DC | PRN

## 2019-06-30 MED ORDER — LABETALOL HCL 5 MG/ML IV SOLN
10.0000 mg | INTRAVENOUS | Status: DC | PRN
  Administered 2019-06-30: 20 mg via INTRAVENOUS
  Filled 2019-06-30: qty 4

## 2019-06-30 MED ORDER — PROPOFOL 10 MG/ML IV BOLUS
INTRAVENOUS | Status: DC | PRN
  Administered 2019-06-30: 120 mg via INTRAVENOUS

## 2019-06-30 MED ORDER — AZELASTINE HCL 0.1 % NA SOLN: 2.0000 | Freq: Every evening | NASAL | Status: DC | PRN

## 2019-06-30 MED ORDER — ROCURONIUM BROMIDE 10 MG/ML (PF) SYRINGE
PREFILLED_SYRINGE | INTRAVENOUS | Status: DC | PRN
  Administered 2019-06-30: 30 mg via INTRAVENOUS

## 2019-06-30 MED ORDER — HEMOSTATIC AGENTS (NO CHARGE) OPTIME
TOPICAL | Status: DC | PRN
  Administered 2019-06-30: 1

## 2019-06-30 MED ORDER — BUPIVACAINE HCL (PF) 0.5 % IJ SOLN
INTRAMUSCULAR | Status: DC | PRN
  Administered 2019-06-30: 5 mL

## 2019-06-30 MED ORDER — ACETAMINOPHEN 650 MG RE SUPP: 650.0000 mg | RECTAL | Status: DC | PRN

## 2019-06-30 MED ORDER — LIDOCAINE 2% (20 MG/ML) 5 ML SYRINGE
INTRAMUSCULAR | Status: DC | PRN
  Administered 2019-06-30: 100 mg via INTRAVENOUS

## 2019-06-30 MED ORDER — PHENYLEPHRINE 40 MCG/ML (10ML) SYRINGE FOR IV PUSH (FOR BLOOD PRESSURE SUPPORT)
PREFILLED_SYRINGE | INTRAVENOUS | Status: DC | PRN
  Administered 2019-06-30 (×2): 80 ug via INTRAVENOUS
  Administered 2019-06-30 (×2): 120 ug via INTRAVENOUS

## 2019-06-30 MED ORDER — ACAI BERRY 500 MG PO CAPS: 2.0000 | ORAL_CAPSULE | Freq: Two times a day (BID) | ORAL | Status: DC

## 2019-06-30 MED ORDER — BUPIVACAINE HCL (PF) 0.5 % IJ SOLN
INTRAMUSCULAR | Status: AC
  Filled 2019-06-30: qty 30

## 2019-06-30 MED ORDER — THROMBIN 5000 UNITS EX SOLR
OROMUCOSAL | Status: DC | PRN
  Administered 2019-06-30: 5 mL

## 2019-06-30 MED ORDER — ONDANSETRON HCL 4 MG PO TABS: 4.0000 mg | ORAL_TABLET | ORAL | Status: DC | PRN

## 2019-06-30 MED ORDER — LIDOCAINE 2% (20 MG/ML) 5 ML SYRINGE
INTRAMUSCULAR | Status: AC
  Filled 2019-06-30: qty 5

## 2019-06-30 MED ORDER — PANTOPRAZOLE SODIUM 40 MG IV SOLR
40.0000 mg | Freq: Every day | INTRAVENOUS | Status: DC
  Administered 2019-06-30 – 2019-07-03 (×4): 40 mg via INTRAVENOUS
  Filled 2019-06-30 (×4): qty 40

## 2019-06-30 SURGICAL SUPPLY — 76 items
APL SKNCLS STERI-STRIP NONHPOA (GAUZE/BANDAGES/DRESSINGS)
BATTERY IQ STERILE (MISCELLANEOUS) ×1 IMPLANT
BENZOIN TINCTURE PRP APPL 2/3 (GAUZE/BANDAGES/DRESSINGS) IMPLANT
BLADE CLIPPER SURG (BLADE) ×2 IMPLANT
BNDG GAUZE ELAST 4 BULKY (GAUZE/BANDAGES/DRESSINGS) IMPLANT
BTRY SRG DRVR 1.5 IQ (MISCELLANEOUS) ×1
BUR ACORN 6.0 PRECISION (BURR) ×2 IMPLANT
BUR MATCHSTICK NEURO 3.0 LAGG (BURR) IMPLANT
BUR SPIRAL ROUTER 2.3 (BUR) ×1 IMPLANT
CANISTER SUCT 3000ML PPV (MISCELLANEOUS) ×2 IMPLANT
CARTRIDGE OIL MAESTRO DRILL (MISCELLANEOUS) ×1 IMPLANT
CATH ROBINSON RED A/P 14FR (CATHETERS) ×1 IMPLANT
CLIP VESOCCLUDE MED 6/CT (CLIP) IMPLANT
COVER WAND RF STERILE (DRAPES) ×2 IMPLANT
DIFFUSER DRILL AIR PNEUMATIC (MISCELLANEOUS) ×2 IMPLANT
DRAIN JACKSON PRATT 10MM FLAT (MISCELLANEOUS) ×1 IMPLANT
DRAPE NEUROLOGICAL W/INCISE (DRAPES) ×2 IMPLANT
DRAPE SURG 17X23 STRL (DRAPES) IMPLANT
DRAPE WARM FLUID 44X44 (DRAPES) ×2 IMPLANT
DRSG TELFA 3X8 NADH (GAUZE/BANDAGES/DRESSINGS) ×2 IMPLANT
DURAPREP 6ML APPLICATOR 50/CS (WOUND CARE) ×2 IMPLANT
ELECT REM PT RETURN 9FT ADLT (ELECTROSURGICAL) ×2
ELECTRODE REM PT RTRN 9FT ADLT (ELECTROSURGICAL) ×1 IMPLANT
EVACUATOR 1/8 PVC DRAIN (DRAIN) IMPLANT
EVACUATOR SILICONE 100CC (DRAIN) ×1 IMPLANT
GAUZE 4X4 16PLY RFD (DISPOSABLE) IMPLANT
GAUZE SPONGE 4X4 12PLY STRL (GAUZE/BANDAGES/DRESSINGS) ×2 IMPLANT
GLOVE BIO SURGEON STRL SZ7.5 (GLOVE) IMPLANT
GLOVE BIOGEL PI IND STRL 7.5 (GLOVE) ×2 IMPLANT
GLOVE BIOGEL PI INDICATOR 7.5 (GLOVE) ×2
GLOVE ECLIPSE 7.0 STRL STRAW (GLOVE) ×4 IMPLANT
GLOVE EXAM NITRILE XL STR (GLOVE) IMPLANT
GOWN STRL REUS W/ TWL LRG LVL3 (GOWN DISPOSABLE) ×2 IMPLANT
GOWN STRL REUS W/ TWL XL LVL3 (GOWN DISPOSABLE) IMPLANT
GOWN STRL REUS W/TWL 2XL LVL3 (GOWN DISPOSABLE) IMPLANT
GOWN STRL REUS W/TWL LRG LVL3 (GOWN DISPOSABLE) ×4
GOWN STRL REUS W/TWL XL LVL3 (GOWN DISPOSABLE)
HEMOSTAT POWDER KIT SURGIFOAM (HEMOSTASIS) ×2 IMPLANT
HEMOSTAT POWDER SURGIFOAM 1G (HEMOSTASIS) ×1 IMPLANT
HEMOSTAT SURGICEL 2X14 (HEMOSTASIS) IMPLANT
KIT BASIN OR (CUSTOM PROCEDURE TRAY) ×2 IMPLANT
KIT TURNOVER KIT B (KITS) ×2 IMPLANT
NEEDLE HYPO 22GX1.5 SAFETY (NEEDLE) ×2 IMPLANT
NS IRRIG 1000ML POUR BTL (IV SOLUTION) ×2 IMPLANT
OIL CARTRIDGE MAESTRO DRILL (MISCELLANEOUS) ×2
PACK CRANIOTOMY CUSTOM (CUSTOM PROCEDURE TRAY) ×2 IMPLANT
PAD DRESSING TELFA 3X8 NADH (GAUZE/BANDAGES/DRESSINGS) IMPLANT
PATTIES SURGICAL .5 X.5 (GAUZE/BANDAGES/DRESSINGS) IMPLANT
PATTIES SURGICAL .5 X3 (DISPOSABLE) IMPLANT
PATTIES SURGICAL 1X1 (DISPOSABLE) IMPLANT
PLATE 1.5  2HOLE LNG NEURO (Plate) ×2 IMPLANT
PLATE 1.5 2HOLE LNG NEURO (Plate) IMPLANT
PLATE 1.5/0.5 18.5MM BURR HOLE (Plate) ×1 IMPLANT
SCREW SELF DRILL HT 1.5/4MM (Screw) ×9 IMPLANT
SET CYSTO W/LG BORE CLAMP LF (SET/KITS/TRAYS/PACK) ×1 IMPLANT
SPONGE NEURO XRAY DETECT 1X3 (DISPOSABLE) IMPLANT
SPONGE SURGIFOAM ABS GEL 100 (HEMOSTASIS) ×2 IMPLANT
SPONGE SURGIFOAM ABS GEL SZ50 (HEMOSTASIS) ×1 IMPLANT
STAPLER VISISTAT 35W (STAPLE) ×2 IMPLANT
STOCKINETTE 6  STRL (DRAPES) ×1
STOCKINETTE 6 STRL (DRAPES) ×1 IMPLANT
SUT ETHILON 3 0 FSL (SUTURE) IMPLANT
SUT ETHILON 3 0 PS 1 (SUTURE) IMPLANT
SUT NURALON 4 0 TR CR/8 (SUTURE) ×6 IMPLANT
SUT STEEL 0 (SUTURE)
SUT STEEL 0 18XMFL TIE 17 (SUTURE) IMPLANT
SUT VIC AB 0 CT1 18XCR BRD8 (SUTURE) ×2 IMPLANT
SUT VIC AB 0 CT1 8-18 (SUTURE) ×4
SUT VIC AB 3-0 SH 8-18 (SUTURE) ×4 IMPLANT
TAPE CLOTH 1X10 TAN NS (GAUZE/BANDAGES/DRESSINGS) ×2 IMPLANT
TOWEL GREEN STERILE (TOWEL DISPOSABLE) ×2 IMPLANT
TOWEL GREEN STERILE FF (TOWEL DISPOSABLE) ×2 IMPLANT
TRAY FOLEY MTR SLVR 16FR STAT (SET/KITS/TRAYS/PACK) ×2 IMPLANT
TUBE CONNECTING 12X1/4 (SUCTIONS) ×2 IMPLANT
UNDERPAD 30X30 (UNDERPADS AND DIAPERS) ×2 IMPLANT
WATER STERILE IRR 1000ML POUR (IV SOLUTION) ×2 IMPLANT

## 2019-06-30 NOTE — Progress Notes (Signed)
Patient arrived to PACU waking up from anesthesia severely aggravated, agitated, combative, anxious, yelling .... Kathyrn Sheriff, MD & Lissa Hoard, MD arrived shortly after to evaluate situation.  Patient settled down after medication administered per MD. Kathyrn Sheriff, MD informed RN it is OK to wait until AM to complete STAT Head CT, ICU RN aware as well.  180 mL serosanguinous drainage emptied off JP drain in PACU, MD aware & OK'd. Pt transferred to 4N ICU, full report given to ICU RN upon arrival.

## 2019-06-30 NOTE — Progress Notes (Signed)
Pt arrived around [place] from PACU. Nurse to nurse bedside report given. Pt very agitated, cussing, uncooperative with exam. Bilateral wrist restraints applied to prevent him from pulling JP drain.  PACU nurse said his wife said he asks like this after surgery. CT ordered for 7/10 AM. MD aware of pt's behavior.

## 2019-06-30 NOTE — Anesthesia Postprocedure Evaluation (Signed)
Anesthesia Post Note  Patient: Lonnie [name]  Procedure(s) Performed: Frontal CRANIOTOMY HEMATOMA EVACUATION SUBDURAL (Left Head)     Patient location during evaluation: PACU Anesthesia Type: General Level of consciousness: sedated, confused and responds to stimulation Pain management: pain level controlled Vital Signs Assessment: post-procedure vital signs reviewed and stable Respiratory status: spontaneous breathing Cardiovascular status: stable Anesthetic complications: no Comments: Pt agitated and combative in PACU. Dr. Diamantina Monks at bedside. Given 2mg  midazolam and remainder of PACU stay was uneventful.    Last Vitals:  Vitals:   06/30/19 1741 06/30/19 1807  BP: (!) 176/82 135/73  Pulse: 61 (!) 59  Resp: 15 15  Temp: 36.4 C   SpO2: 99% 100%    Last Pain:  Vitals:   06/30/19 1210  PainSc: 0-No pain                 Nolon Nations

## 2019-06-30 NOTE — Patient Instructions (Signed)
Get the blood work     [place] Schedule your next appointment   For a check up in 4 weeks    Will get a CT of the head  Will refer you to a neurologist

## 2019-06-30 NOTE — Anesthesia Procedure Notes (Addendum)
Procedure Name: Intubation Date/Time: 06/30/2019 4:33 PM Performed by: Moshe Salisbury, CRNA Pre-anesthesia Checklist: Patient identified, Emergency Drugs available, Suction available and Patient being monitored Patient Re-evaluated:Patient Re-evaluated prior to induction Oxygen Delivery Method: Circle System Utilized Preoxygenation: Pre-oxygenation with 100% oxygen Induction Type: IV induction Ventilation: Mask ventilation without difficulty Laryngoscope Size: Mac and 4 Grade View: Grade I Tube type: Oral Tube size: 8.0 mm Number of attempts: 1 Airway Equipment and Method: Stylet Placement Confirmation: ETT inserted through vocal cords under direct vision,  positive ETCO2 and breath sounds checked- equal and bilateral Secured at: 23 cm Tube secured with: Tape Dental Injury: Teeth and Oropharynx as per pre-operative assessment

## 2019-06-30 NOTE — ED Notes (Signed)
Pt given warm blankets and non skid socks.
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
## 2019-06-30 NOTE — Progress Notes (Signed)
Pre visit review using our clinic review tool, if applicable. No additional management support is needed unless otherwise documented below in the visit note. 

## 2019-06-30 NOTE — ED Triage Notes (Signed)
Pt here from home with a head bleed after getting a c scan out pt , pt alert and oriented on arrival
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
## 2019-06-30 NOTE — ED Notes (Signed)
Neurosurgery team as bedside

## 2019-06-30 NOTE — Anesthesia Preprocedure Evaluation (Addendum)
Anesthesia Evaluation  Patient identified by MRN, date of birth, ID band Patient awake    Reviewed: Allergy & Precautions, H&P , NPO status , Patient's Chart, lab work & pertinent test results, reviewed documented beta blocker date and time   History of Anesthesia Complications Negative for: history of anesthetic complications  Airway Mallampati: I  TM Distance: >3 FB Neck ROM: full    Dental  (+) Teeth Intact, Dental Advisory Given, Partial Lower   Pulmonary Current Smoker,    Pulmonary exam normal breath sounds clear to auscultation       Cardiovascular hypertension, Pt. on medications and Pt. on home beta blockers + CAD (60% L main), + Past MI, + CABG, + Peripheral Vascular Disease and +CHF  Normal cardiovascular exam Rhythm:Irregular Rate:Bradycardia  11/14 ECHO: EF 40-45%, severe antero-lateral and antero-septal hypokinesis   Neuro/Psych PSYCHIATRIC DISORDERS Anxiety Depression negative neurological ROS     GI/Hepatic negative GI ROS, Neg liver ROS,   Endo/Other  negative endocrine ROSprediabetes  Renal/GU negative Renal ROS  negative genitourinary   Musculoskeletal  (+) Arthritis ,   Abdominal Normal abdominal exam  (+)   Peds  Hematology negative hematology ROS (+)   Anesthesia Other Findings   Reproductive/Obstetrics negative OB ROS                             Anesthesia Physical  Anesthesia Plan  ASA: III and emergent  Anesthesia Plan: General   Post-op Pain Management:    Induction: Intravenous  PONV Risk Score and Plan: 2 and Ondansetron, Dexamethasone and Treatment may vary due to age or medical condition  Airway Management Planned: Oral ETT  Additional Equipment: Arterial line  Intra-op Plan:   Post-operative Plan: Possible Post-op intubation/ventilation  Informed Consent: I have reviewed the patients History and Physical, chart, labs and discussed the  procedure including the risks, benefits and alternatives for the proposed anesthesia with the patient or authorized representative who has indicated his/her understanding and acceptance.     Dental advisory given  Plan Discussed with: CRNA  Anesthesia Plan Comments:        Anesthesia Quick Evaluation
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
## 2019-06-30 NOTE — Transfer of Care (Signed)
Immediate Anesthesia Transfer of Care Note  Patient: Lonnie [name]  Procedure(s) Performed: Frontal CRANIOTOMY HEMATOMA EVACUATION SUBDURAL (Left Head)  Patient Location: PACU  Anesthesia Type:General  Level of Consciousness: confused  Airway & Oxygen Therapy: Patient Spontanous Breathing and Patient connected to nasal cannula oxygen  Post-op Assessment: Report given to RN, Post -op Vital signs reviewed and stable and Patient moving all extremities  Post vital signs: Reviewed and stable  Last Vitals:  Vitals Value Taken Time  BP 176/82 06/30/19 1741  Temp    Pulse 59 06/30/19 1746  Resp 22 06/30/19 1746  SpO2 97 % 06/30/19 1746  Vitals shown include unvalidated device data.  Last Pain:  Vitals:   06/30/19 1210  PainSc: 0-No pain         Complications: No apparent anesthesia complications

## 2019-07-01 ENCOUNTER — Encounter (HOSPITAL_COMMUNITY): Payer: Medicare HMO

## 2019-07-01 ENCOUNTER — Inpatient Hospital Stay (HOSPITAL_COMMUNITY): Payer: Medicare HMO

## 2019-07-01 ENCOUNTER — Encounter (HOSPITAL_COMMUNITY): Payer: Self-pay | Admitting: Radiology

## 2019-07-01 LAB — CBC
HCT: 46.8 % (ref 39.0–52.0)
Hemoglobin: 15.1 g/dL (ref 13.0–17.0)
MCH: 27.8 pg (ref 26.0–34.0)
MCHC: 32.3 g/dL (ref 30.0–36.0)
MCV: 86 fL (ref 80.0–100.0)
Platelets: 229 10*3/uL (ref 150–400)
RBC: 5.44 MIL/uL (ref 4.22–5.81)
RDW: 13.3 % (ref 11.5–15.5)
WBC: 15.8 10*3/uL — ABNORMAL HIGH (ref 4.0–10.5)
nRBC: 0 % (ref 0.0–0.2)

## 2019-07-01 LAB — BASIC METABOLIC PANEL
Anion gap: 14 (ref 5–15)
BUN: 18 mg/dL (ref 8–23)
CO2: 19 mmol/L — ABNORMAL LOW (ref 22–32)
Calcium: 9.3 mg/dL (ref 8.9–10.3)
Chloride: 106 mmol/L (ref 98–111)
Creatinine, Ser: 1.17 mg/dL (ref 0.61–1.24)
GFR calc Af Amer: >60 mL/min (ref >60)
GFR calc non Af Amer: >60 mL/min (ref >60)
Glucose, Bld: 117 mg/dL — ABNORMAL HIGH (ref 70–99)
Potassium: 4.4 mmol/L (ref 3.5–5.1)
Sodium: 139 mmol/L (ref 135–145)

## 2019-07-01 LAB — HIV ANTIBODY (ROUTINE TESTING W REFLEX): HIV Screen 4th Generation wRfx: NONREACTIVE

## 2019-07-01 LAB — MRSA PCR SCREENING: MRSA by PCR: NEGATIVE

## 2019-07-01 IMAGING — CT CT HEAD WITHOUT CONTRAST
4 of 5 series · 15 of 47 positions shown, 16 images · non-contrast
Comparison: CT head without contrast [DATE]

CLINICAL DATA: Subdural hematoma.  Left frontal craniotomy.

EXAM:
CT HEAD WITHOUT CONTRAST
TECHNIQUE: Contiguous axial images were obtained from the base of the skull
through the vertex without intravenous contrast.

[Series 4: head without ax · axial · non-contrast · 0.37mm/px · z∈[-23,+64]mm · 3 of 36 slices shown, 4 images]
[im 9/36  brain]
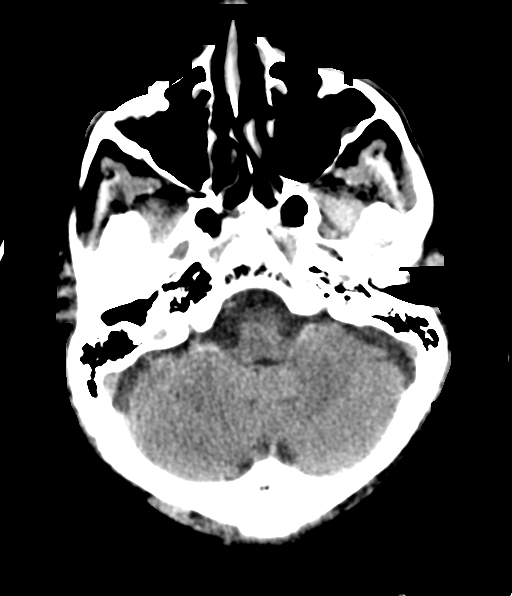
[im 9/36  bone]
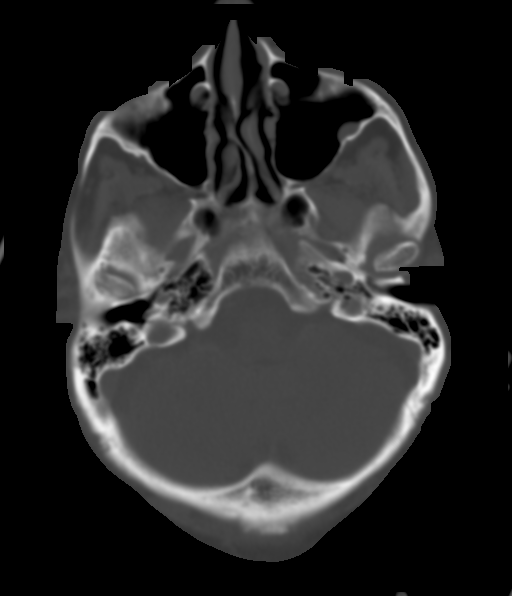
[im 18/36  brain]
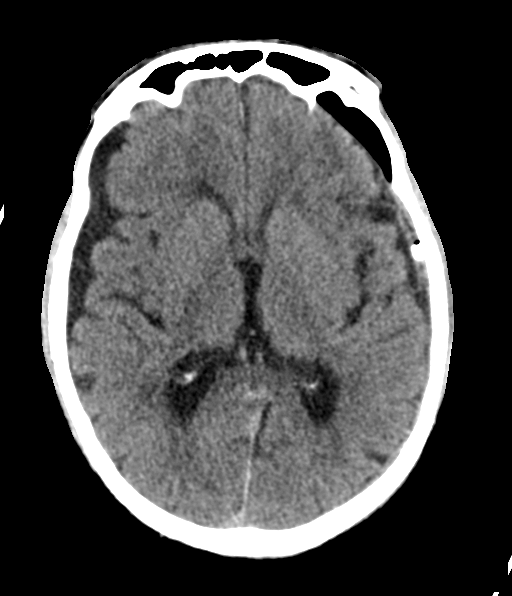
[im 27/36  brain]
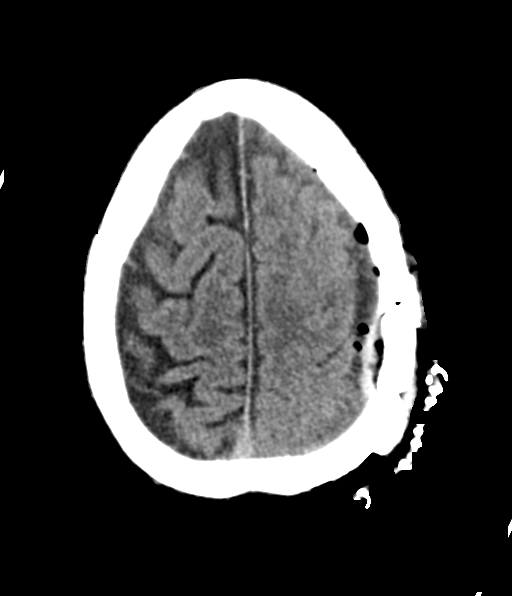

[Series 6: head without cor · coronal · non-contrast · 0.33mm/px · 3 of 71 slices shown]
[im 24/71  brain]
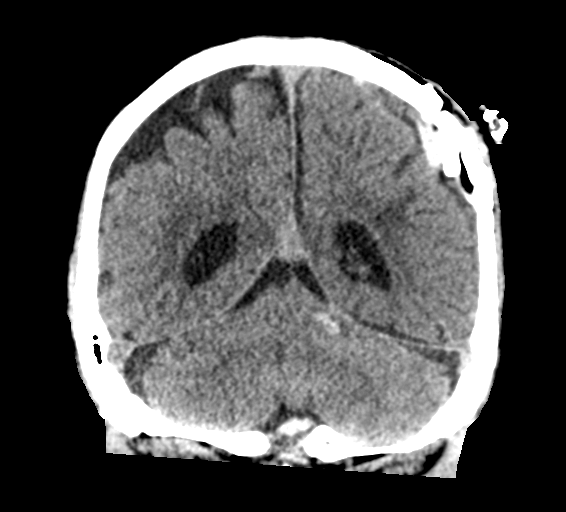
[im 32/71  brain]
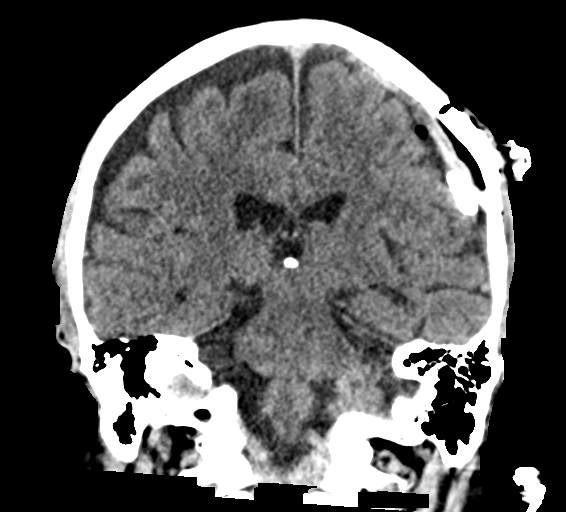
[im 39/71  brain]
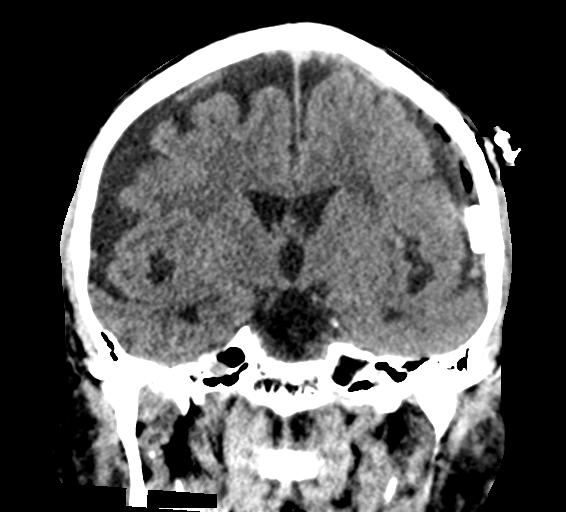

[Series 7: head without sag · sagittal · non-contrast · 0.33mm/px · 3 of 58 slices shown]
[im 20/58  brain]
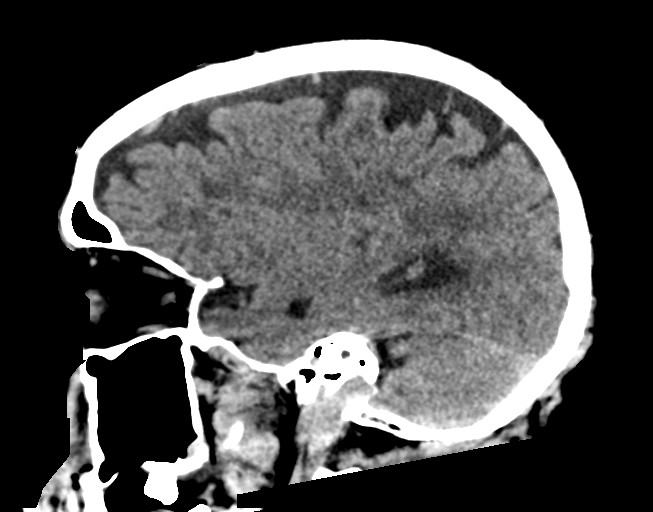
[im 29/58  brain]
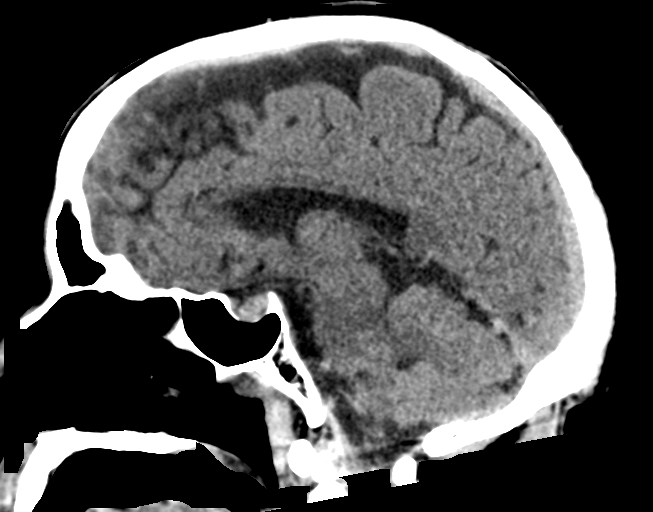
[im 39/58  brain]
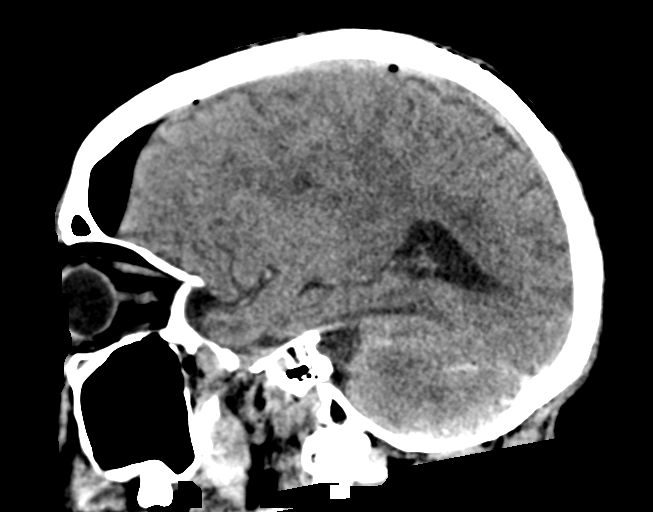

[Series 8: ax head bone · axial · 0.39mm/px · z∈[-64,+36]mm · 6 of 89 slices shown]
[im 8/89  bone]
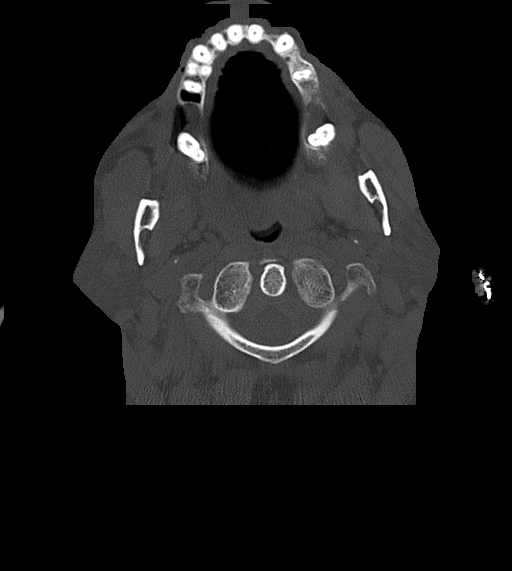
[im 23/89  bone]
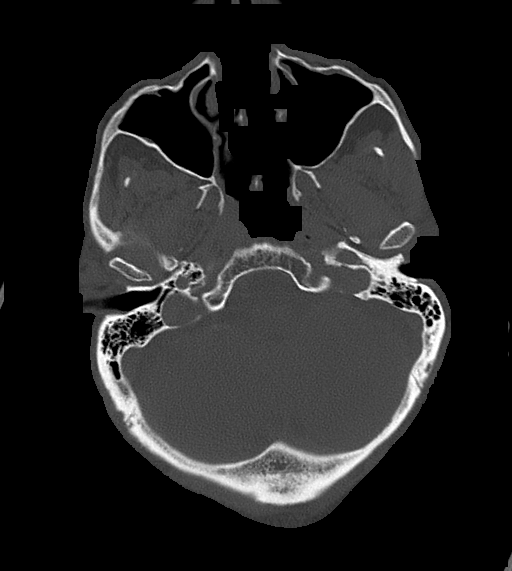
[im 30/89  bone]
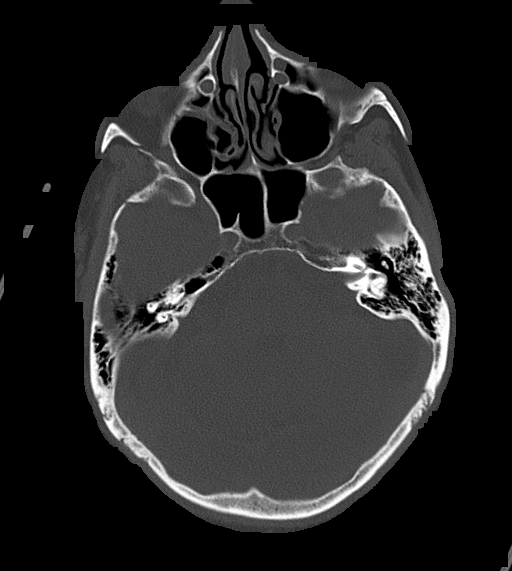
[im 37/89  bone]
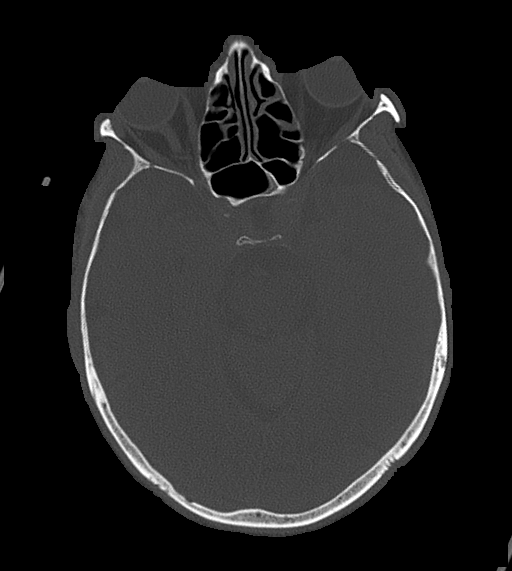
[im 52/89  bone]
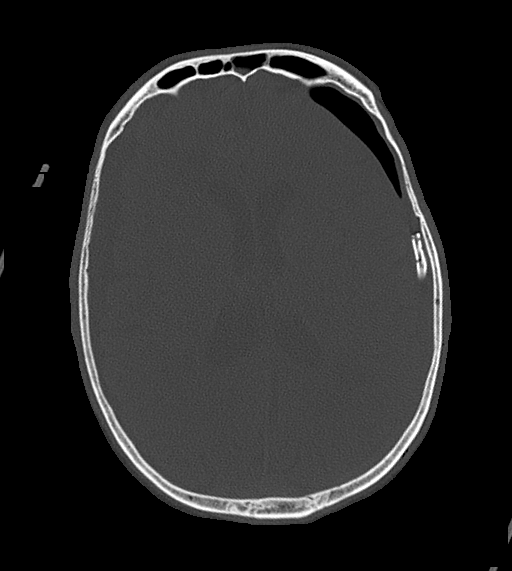
[im 59/89  bone]
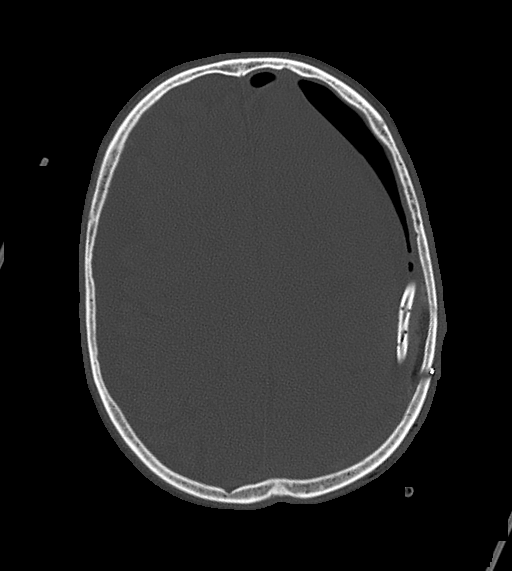

[15 of 47 positions shown; findings below may reference images not displayed]

FINDINGS: Brain: The patient is status post left frontal craniotomy evaluation
of the subdural hematoma. A drainage catheter is in place. There is
significant reduction in the extra-axial collection. Left-sided
pneumocephalus is noted. They is no significant residual mass
effect. Prominence of the extra-axial space over the right
hemisphere is noted.

Advanced atrophy and diffuse white matter disease is present.

Hyperdense acute hemorrhage in the left superior cerebellar vermis
is new. There is also a 9 mm focal white matter hemorrhage in the
left cerebellum.

The ventricles are of normal size.

Vascular: No hyperdense vessel or unexpected calcification.

Skull: Left frontal craniotomy. Calvarium is otherwise within normal
limits. No significant extra-axial fluid collection is present.

Sinuses/Orbits: Small polyp or mucous retention cyst is present in
the left maxillary sinus. Remaining paranasal sinuses in the mastoid
air cells are clear. The globes and orbits are within normal limits.
IMPRESSION: 1. Interval decompression of left subdural hematoma. Left craniotomy
and subdural drain are noted.
2. New hemorrhage bowel vein the left superior cerebellar vermis and
left cerebellar white matter without significant mass effect.
3. Advanced atrophy and white matter disease likely reflects the
sequela of chronic microvascular ischemia.

## 2019-07-01 MED ORDER — LORAZEPAM 2 MG/ML IJ SOLN
1.0000 mg | INTRAMUSCULAR | Status: DC | PRN
  Administered 2019-07-01 – 2019-07-03 (×5): 2 mg via INTRAVENOUS
  Administered 2019-07-03: 1 mg via INTRAVENOUS
  Administered 2019-07-05: 2 mg via INTRAVENOUS
  Filled 2019-07-01 (×6): qty 1

## 2019-07-01 MED ORDER — LORAZEPAM 2 MG/ML IJ SOLN
INTRAMUSCULAR | Status: AC
  Filled 2019-07-01: qty 1

## 2019-07-01 MED ORDER — CHLORHEXIDINE GLUCONATE CLOTH 2 % EX PADS
6.0000 | MEDICATED_PAD | Freq: Every day | CUTANEOUS | Status: DC
  Administered 2019-07-01: 6 via TOPICAL

## 2019-07-01 NOTE — Progress Notes (Signed)
Pt due for repeat head CT, but still intermittently restless/agitated.  Provider on call, Riki Altes, notified of situation.  Orders given.
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
## 2019-07-01 NOTE — Progress Notes (Signed)
Assisted tele visit to patient with family member.  Chelci Wintermute McEachran, RN  

## 2019-07-01 NOTE — Progress Notes (Signed)
Rehab Admissions Coordinator Note:  Patient was screened by Cleatrice Burke for appropriateness for an Inpatient Acute Rehab Consult per PT recs. I await further participation with therapy before pursuing an inpt rehab consult I will follow.    Cleatrice Burke  RN MSN 07/01/2019, 1:46 PM  I can be reached at [phone].
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
## 2019-07-01 NOTE — Progress Notes (Signed)
  NEUROSURGERY PROGRESS NOTE   Pt seen and examined. Has been agitated, aggressive since surgery.   EXAM: Temp:  [97.4 F (36.3 C)-98.9 F (37.2 C)] 98.1 F (36.7 C) (07/10 1200) Pulse Rate:  [48-83] 58 (07/10 1100) Resp:  [10-21] 11 (07/10 1100) BP: (101-203)/(58-128) 101/60 (07/10 1100) SpO2:  [96 %-100 %] 100 % (07/10 1100) Weight:  [77.2 kg] 77.2 kg (07/09 1526) Intake/Output      07/09 0701 - 07/10 0700 07/10 0701 - 07/11 0700   I.V. (mL/kg) 712.9 (9.2) 246.3 (3.2)   IV Piggyback 200 200   Total Intake(mL/kg) 912.9 (11.8) 446.3 (5.8)   Urine (mL/kg/hr) 1450 860 (2.2)   Drains 380    Blood 150    Total Output 1980 860   Net -1067.1 -413.7         Awake, alert Speech fluent, some difficulty with naming, repetition intact CN grossly intact Good strength RUE/RLE ? Mild right neglect Dressing c/d/i, JP in place 380cc  LABS: Lab Results  Component Value Date   CREATININE 1.17 07/01/2019   BUN 18 07/01/2019   NA 139 07/01/2019   K 4.4 07/01/2019   CL 106 07/01/2019   CO2 19 (L) 07/01/2019   Lab Results  Component Value Date   WBC 15.8 (H) 07/01/2019   HGB 15.1 07/01/2019   HCT 46.8 07/01/2019   MCV 86.0 07/01/2019   PLT 229 07/01/2019    IMAGING: CTH this am reviewed demonstrating complete evacuation of SDH, JP drain in place. No MLS, No HCP  IMPRESSION: - 68 y.o. male POD#1 s/p left crani for evacuation chronic SDH, largely non-focal exam but remains somewhat agitated/agressive. CT reassuring   PLAN: - Cont supportive care - Will keep JP for today, can d/c over weekend - PT/OT today

## 2019-07-02 MED ORDER — CHLORHEXIDINE GLUCONATE 0.12 % MT SOLN
15.0000 mL | Freq: Two times a day (BID) | OROMUCOSAL | Status: DC
  Administered 2019-07-02 (×2): 15 mL via OROMUCOSAL

## 2019-07-02 MED ORDER — ORAL CARE MOUTH RINSE
15.0000 mL | Freq: Two times a day (BID) | OROMUCOSAL | Status: DC
  Administered 2019-07-02 – 2019-07-04 (×5): 15 mL via OROMUCOSAL

## 2019-07-02 MED ORDER — CHLORHEXIDINE GLUCONATE CLOTH 2 % EX PADS
6.0000 | MEDICATED_PAD | Freq: Every day | CUTANEOUS | Status: DC
  Administered 2019-07-02 – 2019-07-08 (×7): 6 via TOPICAL
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
## 2019-07-02 NOTE — Progress Notes (Signed)
AT 1300 RN discontinued restraints. Pt was alert, conversant, still confused to time and place and situation. Bed alarm in place. RN monitored closely, gave pt frequent water, turning and positioning, spending time with patient. Pt slept for about 30 minutes. At 1500 pt was crawling out of bed, trying to pick at his scalp staples. Attempted several times to reorient and convince pt to stay in bed and leave scalp incision alone. Called MD and bilateral wrist restraints applied. Ellamae Sia

## 2019-07-02 NOTE — Progress Notes (Signed)
Assisted tele visit to patient with daughter.  Zetha Kuhar William, RN  

## 2019-07-03 LAB — CBC
HCT: 42.6 % (ref 39.0–52.0)
Hemoglobin: 14 g/dL (ref 13.0–17.0)
MCH: 27.6 pg (ref 26.0–34.0)
MCHC: 32.9 g/dL (ref 30.0–36.0)
MCV: 84 fL (ref 80.0–100.0)
Platelets: 220 10*3/uL (ref 150–400)
RBC: 5.07 MIL/uL (ref 4.22–5.81)
RDW: 13.5 % (ref 11.5–15.5)
WBC: 11.1 10*3/uL — ABNORMAL HIGH (ref 4.0–10.5)
nRBC: 0 % (ref 0.0–0.2)

## 2019-07-03 LAB — BASIC METABOLIC PANEL
Anion gap: 9 (ref 5–15)
BUN: 18 mg/dL (ref 8–23)
CO2: 21 mmol/L — ABNORMAL LOW (ref 22–32)
Calcium: 8.9 mg/dL (ref 8.9–10.3)
Chloride: 109 mmol/L (ref 98–111)
Creatinine, Ser: 0.88 mg/dL (ref 0.61–1.24)
GFR calc Af Amer: >60 mL/min (ref >60)
GFR calc non Af Amer: >60 mL/min (ref >60)
Glucose, Bld: 87 mg/dL (ref 70–99)
Potassium: 3.9 mmol/L (ref 3.5–5.1)
Sodium: 139 mmol/L (ref 135–145)

## 2019-07-03 LAB — MAGNESIUM: Magnesium: 2.1 mg/dL (ref 1.7–2.4)

## 2019-07-03 NOTE — Progress Notes (Signed)
Patients bp at 0901 171/81. Administered scheduled 25mg  metoprolol at 0905. At 0915 pt had bradycardic event on monitor, heart  Rate 30,normally 60-70. Pt asymptomatic, RN was with patient at that time. Another event at 0930. Review of EKG at desk reveals junctional rhythm. Event last for 3 beats and returns to sinus. Pt has had frequent pvcs this am and a 5 run of vtach. Potassium is 4.4. No further bradycardic events. Continue to monitor and alert NP. Ellamae Sia

## 2019-07-03 NOTE — Progress Notes (Signed)
Subjective: Patient resting in bed, comfortable.  Objective: Vital signs in last 24 hours: Vitals:   07/03/19 0200 07/03/19 0205 07/03/19 0300 07/03/19 0400  BP:  (!) 136/59 (!) 158/87 140/77  Pulse: (!) 53 (!) 55 (!) 44 (!) 57  Resp: 16 17 18 13   Temp:      TempSrc:      SpO2: 98% 99% 98% 99%  Weight:      Height:        Intake/Output from previous day: 07/11 0701 - 07/12 0700 In: 2540.8 [P.O.:360; I.V.:1959.9; IV Piggyback:220.9] Out: 660 [Urine:600; Drains:60] Intake/Output this shift: Total I/O In: 1576 [I.V.:1455.1; IV Piggyback:120.9] Out: -   Physical Exam: Opening eyes to voice, follows commands with all 4 extremities.  Some perseveration: Wished patient good morning, and he repeatedly said good morning.  Wound healing well.  CBC Recent Labs    06/30/19 1159 07/01/19 0636  WBC 10.2 15.8*  HGB 15.5 15.1  HCT 48.5 46.8  PLT 287 229   BMET Recent Labs    06/30/19 1159 07/01/19 0636  NA 138 139  K 4.6 4.4  CL 106 106  CO2 23 19*  GLUCOSE 109* 117*  BUN 19 18  CREATININE 1.15 1.17  CALCIUM 9.3 9.3    Assessment/Plan: Continues to gradually improve.  Continuing supportive care.   Hosie Spangle, MD 07/03/2019, 5:22 AM

## 2019-07-03 NOTE — Progress Notes (Signed)
Assisted tele visit to patient with family member.  Jacqualine Weichel M, RN  

## 2019-07-04 ENCOUNTER — Inpatient Hospital Stay (HOSPITAL_COMMUNITY): Payer: Medicare HMO

## 2019-07-04 IMAGING — CT CT HEAD WITHOUT CONTRAST
4 series · 15 of 47 positions shown, 17 images · non-contrast
Comparison: [DATE]

CLINICAL DATA: Altered mental status, decreased responsiveness

EXAM:
CT HEAD WITHOUT CONTRAST
TECHNIQUE: Contiguous axial images were obtained from the base of the skull
through the vertex without intravenous contrast.

[Series 3: head wo · axial · 0.43mm/px · z∈[+1151,+1271]mm · 7 of 32 slices shown, 9 images]
[im 4/32  brain]
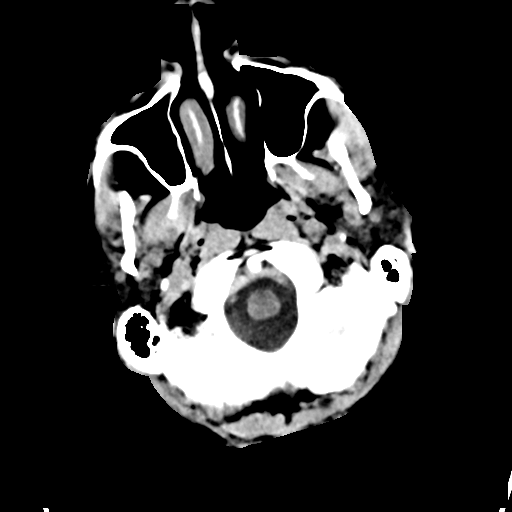
[im 4/32  bone]
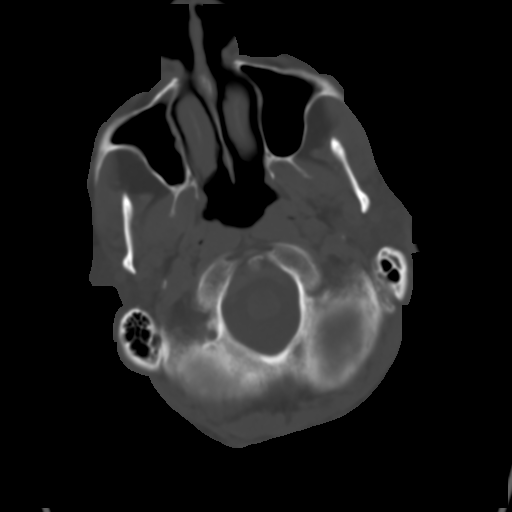
[im 8/32  brain]
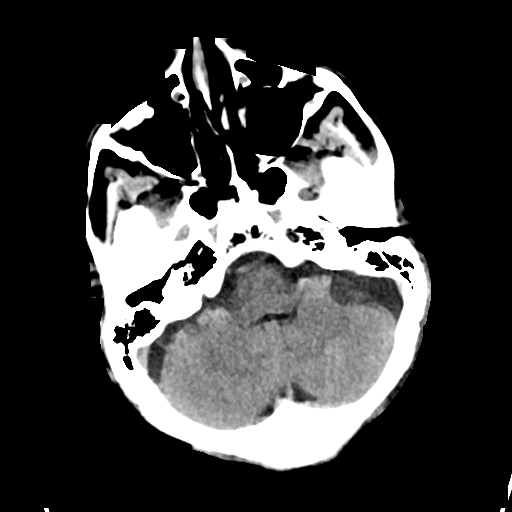
[im 12/32  brain]
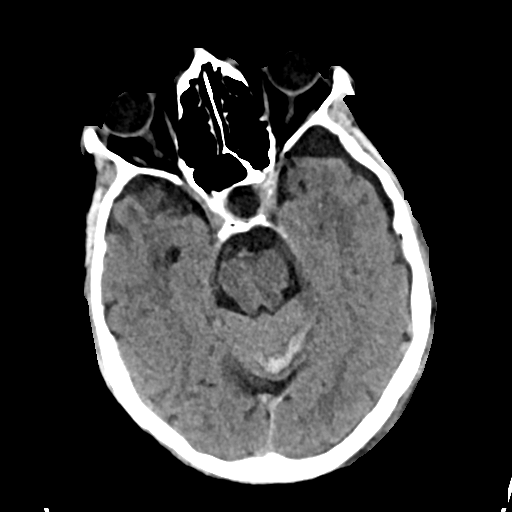
[im 16/32  brain]
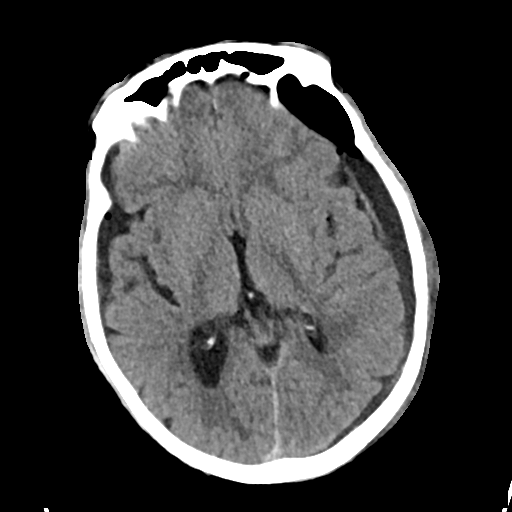
[im 20/32  brain]
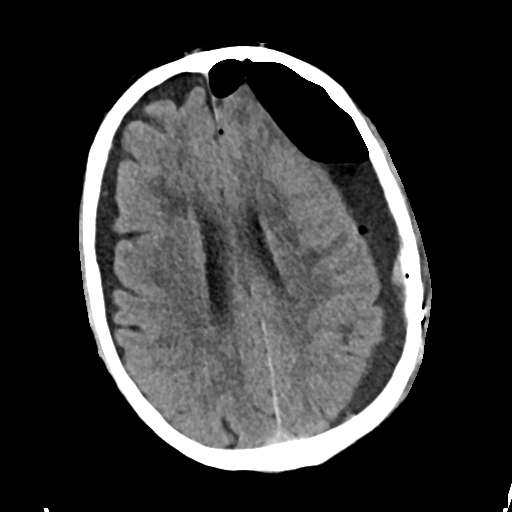
[im 20/32  bone]
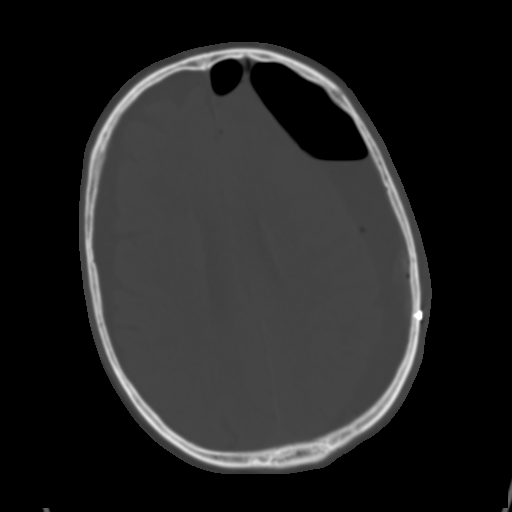
[im 24/32  brain]
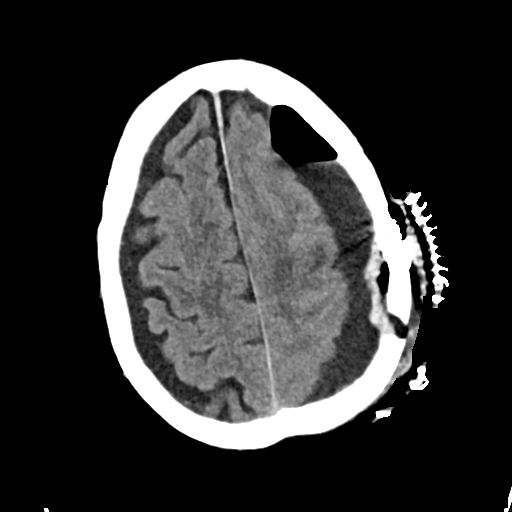
[im 28/32  brain]
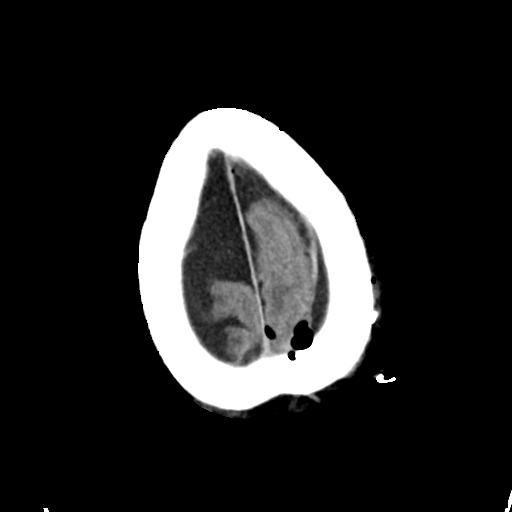

[Series 4: head bone · axial · 0.43mm/px · z∈[+1150,+1166]mm · 2 of 80 slices shown]
[im 8/80  bone]
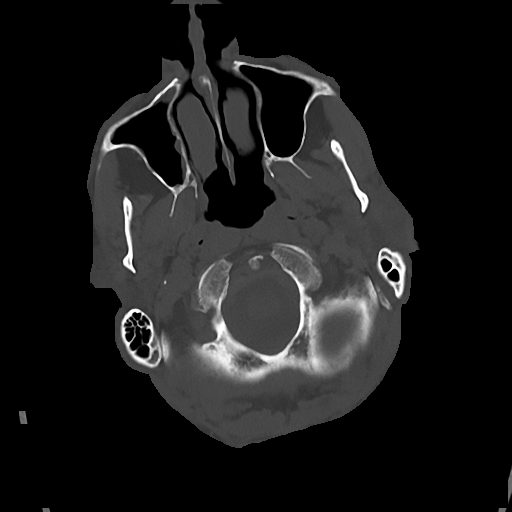
[im 16/80  bone]
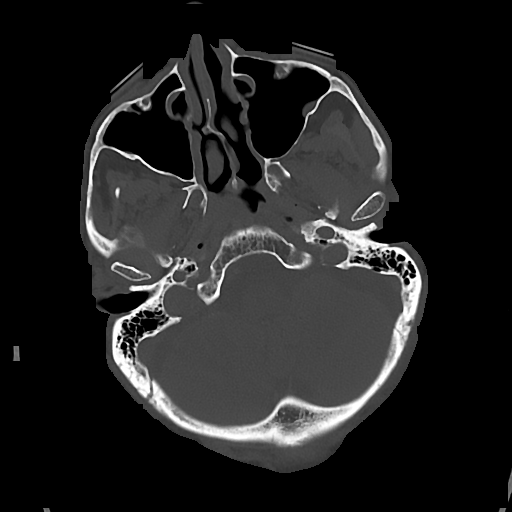

[Series 5: cor soft · coronal · 0.36mm/px · 3 of 69 slices shown]
[im 23/69  brain]
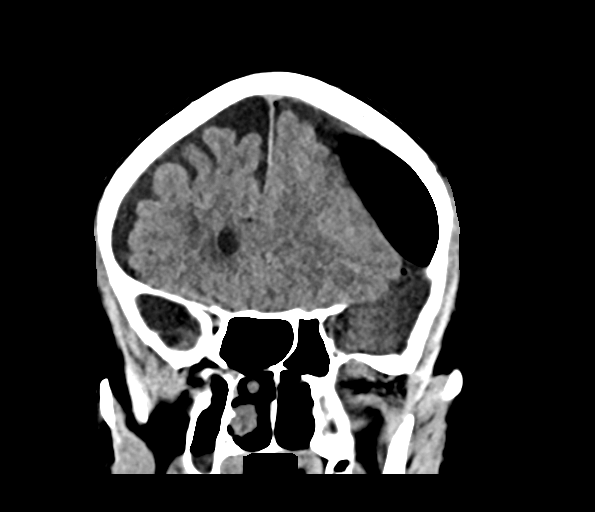
[im 31/69  brain]
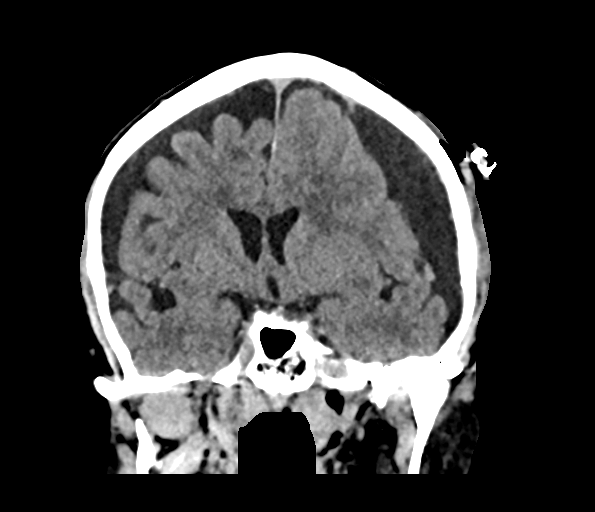
[im 38/69  brain]
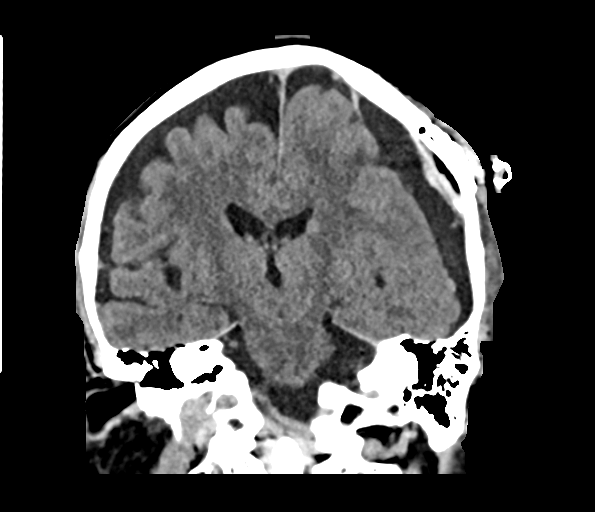

[Series 6: sag soft · sagittal · 0.34mm/px · 3 of 57 slices shown]
[im 19/57  brain]
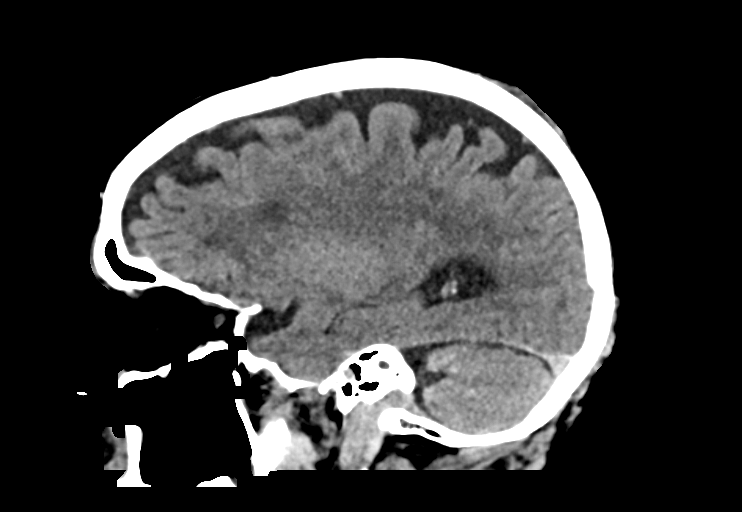
[im 29/57  brain]
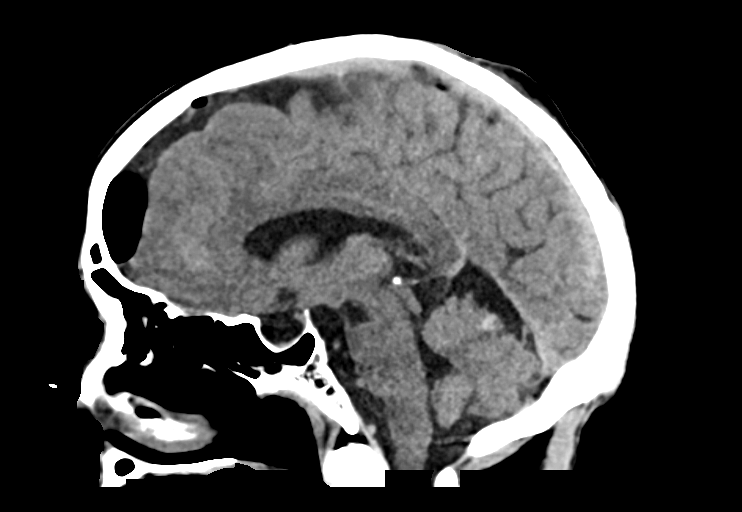
[im 38/57  brain]
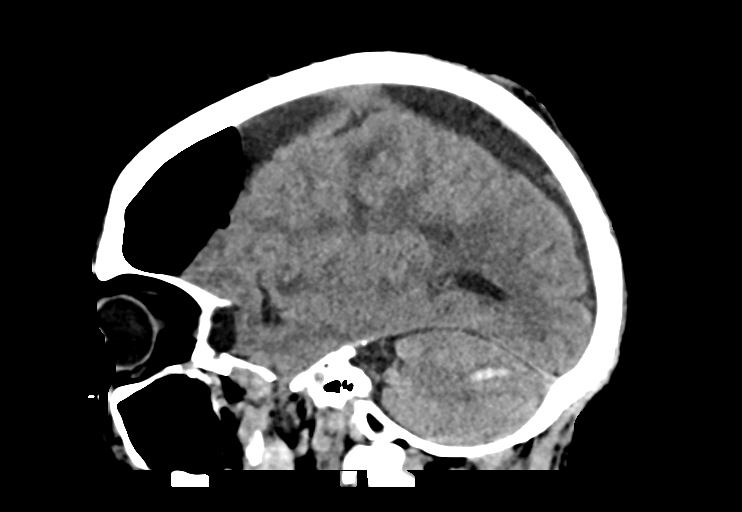

[15 of 47 positions shown; findings below may reference images not displayed]

FINDINGS: Brain: There has been interval removal of a previously seen
left-sided subdural drain catheter, with an increase in left-sided
pneumocephalus and low attenuation subdural collection (series 3,
image 20). There is increased mass effect on the left hemisphere and
left lateral ventricle, with new left right midline shift of
approximately 4 mm (series 3, image 18). Small low-attenuation right
subdural collection is unchanged. No significant change in
hemorrhage within and/or about the folia of the superior cerebellar
vermis and left cerebellar hemisphere (series 3, image 12).
Periventricular white matter hypodensity.

Vascular: No hyperdense vessel or unexpected calcification.

Skull: Redemonstrated postoperative findings of left parietal
craniotomy. Negative for fracture or focal lesion.

Sinuses/Orbits: No acute finding.

Other: None.
IMPRESSION: 1. There has been interval removal of a previously seen left-sided
subdural drain catheter, with an increase in left-sided
pneumocephalus and low attenuation subdural collection (series 3,
image 20). There is increased mass effect on the left hemisphere and
left lateral ventricle, with new left right midline shift of
approximately 4 mm (series 3, image 18).

2. No significant change in parenchymal hemorrhage within the
superior cerebellar vermis and left cerebellar hemisphere (series 3,
image 12).

3.  Small low-attenuation right subdural collection is unchanged.

4. Redemonstrated postoperative findings of left parietal
craniotomy.

These results will be called to the ordering clinician or
representative by the Radiologist Assistant, and communication
documented in the PACS or zVision Dashboard.

## 2019-07-04 MED ORDER — PANTOPRAZOLE SODIUM 40 MG PO TBEC
40.0000 mg | DELAYED_RELEASE_TABLET | Freq: Every day | ORAL | Status: DC
  Administered 2019-07-04 – 2019-07-07 (×4): 40 mg via ORAL
  Filled 2019-07-04 (×4): qty 1
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
## 2019-07-04 NOTE — Progress Notes (Signed)
  NEUROSURGERY PROGRESS NOTE   No issues overnight.  Reports feeling "swimmy headed"  Denies pain  EXAM:  BP (!) 155/85   Pulse (!) 46   Temp 98.4 F (36.9 C) (Oral)   Resp 15   Ht 6' 2.02" (1.88 m)   Wt 77.2 kg   SpO2 95%   BMI 21.83 kg/m   Awake, alert, oriented Speech slow, but appropriate PERRL, right 28mm, left 47mm MAEW with symmetric strength, nonfocal exam Incision c/d/i  IMPRESSION/PLAN 68 y.o. male s/p craniotomy for evacuation of SDH. Stable neurologically although making slow progress, still with intermittent agitation. - Agitation: d/c Keppra to see if this is contributing - PT/OT rec CIR. Order entered

## 2019-07-04 NOTE — Progress Notes (Signed)
Assisted tele visit to patient with daughter, [name].  Lonnie Benn M Angelisa Winthrop, RN   

## 2019-07-04 NOTE — Progress Notes (Signed)
NP Meyran paged and made aware of patient's unequal pupils; right pupil 2 mm sluggish, left pupil 3 mm very sluggish. Patient was alert, following commands, moving all with good strength. No new orders received, will continue to monitor.

## 2019-07-04 NOTE — Progress Notes (Signed)
Patient with increased drowsiness after PT session. Unable to carry on a full conversation with eyes open. Patient still follows commands and is oriented x4 with heavy cueing from nursing staff. Spoke with [place], [place], head CT ordered. Will continue to monitor. Lianne Bushy RN BSN.

## 2019-07-04 NOTE — Progress Notes (Signed)
Inpatient Rehabilitation Admissions Coordinator  Inpatient rehab consult received. Noted medical issues today. I will follow up with rehab assessment tomorrow.  Danne Baxter, RN, MSN Rehab Admissions Coordinator [phone] 07/04/2019 4:36 PM

## 2019-07-04 NOTE — Progress Notes (Signed)
  NEUROSURGERY PROGRESS NOTE   Pt seen and examined. No real HA currently.  EXAM:  BP 134/66 (BP Location: Right Arm)   Pulse (!) 47   Temp 97.8 F (36.6 C) (Oral)   Resp 14   Ht 6' 2.02" (1.88 m)   Wt 77.2 kg   SpO2 100%   BMI 21.83 kg/m   Drowsy but arouses easily. oriented x3 Speech fluent CN grossly intact  Good strength  CTH reviewed demonstrating continued left frontal pneumocephalus. There has been interval reaccumulation of subdural fluid with mass effect on the left fronto-parietal lobes and some MLS.  IMPRESSION:  68 y.o. male POD# 4 s/p left crani for SDH. Clinically non-focal exam but CT does demonstrate some re-accumulation.  PLAN: - Will cont to monitor neurologic exam - Cont dispo planning however if exam changes or repeat scanning reveals continued re-accumulation, may need to consider repeat craniotomy for evacuation
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
## 2019-07-04 NOTE — Progress Notes (Signed)
Lonnie [name], [place] aware of CT findings. Patient HOB flat with 15L NRB. Will continue to monitor. Lianne Bushy RN BSN

## 2019-07-05 LAB — URINALYSIS, ROUTINE W REFLEX MICROSCOPIC
Bilirubin Urine: NEGATIVE
Glucose, UA: NEGATIVE mg/dL
Ketones, ur: NEGATIVE mg/dL
Nitrite: NEGATIVE
Protein, ur: NEGATIVE mg/dL
Specific Gravity, Urine: 1.019 (ref 1.005–1.030)
pH: 6 (ref 5.0–8.0)

## 2019-07-05 NOTE — Progress Notes (Signed)
Inpatient Rehabilitation Admissions Coordinator  Inpatient rehab consult received. I met with patient at bedside for rehab assessment. He seems much improved since yesterday . I discussed the possibility of an inpt rehab admission with patient as well as with his daughter and wife by phone. They are in agreement. I await updated therapy assessments to begin insurance authorization with [employer]. He is an excellent candidate for CIR.  Danne Baxter, RN, MSN Rehab Admissions Coordinator [phone] 07/05/2019 11:15 AM

## 2019-07-05 NOTE — Progress Notes (Signed)
Assisted tele visit to patient with daughter.  Dannia Snook William, RN  

## 2019-07-05 NOTE — Progress Notes (Signed)
Pt increased urinary frequency, cloudy urine w/ sediment, and intermittently agitated/confused. Notified [place], [place]. Verbal order for UA and UC.

## 2019-07-05 NOTE — Progress Notes (Signed)
  NEUROSURGERY PROGRESS NOTE   No issues overnight.  Sitting upright eating breakfast Feels much improved this am. No recollection of past couple days Denies HA, N/V  EXAM:  BP 101/66   Pulse (!) 56   Temp 98.5 F (36.9 C) (Oral)   Resp 14   Ht 6' 2.02" (1.88 m)   Wt 77.2 kg   SpO2 94%   BMI 21.83 kg/m   Awake, alert, oriented  Speech fluent, appropriate  CN grossly intact  MAEW with symmetric strength, nonfocal Incision c/d/i  IMPRESSION/PLAN 68 y.o. male POD#5 s/p left crani for SDH. Much improved compared to yesterday. - Continue to monitor neuro exam - PT/OT today

## 2019-07-05 NOTE — Progress Notes (Signed)
Assisted tele visit to patient with wife and daughter.  Cleotis Nipper, RN

## 2019-07-06 ENCOUNTER — Inpatient Hospital Stay (HOSPITAL_COMMUNITY): Payer: Medicare HMO

## 2019-07-06 IMAGING — CT CT HEAD WITHOUT CONTRAST
4 series · 16 of 47 positions shown, 18 images · non-contrast
Comparison: CT head without contrast [DATE] [DATE],
[DATE]

CLINICAL DATA: Subdural hematoma.  Persistent left-sided headache.

EXAM:
CT HEAD WITHOUT CONTRAST
TECHNIQUE: Contiguous axial images were obtained from the base of the skull
through the vertex without intravenous contrast.

[Series 3: head without · axial · non-contrast · 0.46mm/px · z∈[-128,-8]mm · 7 of 34 slices shown, 9 images]
[im 5/34  brain]
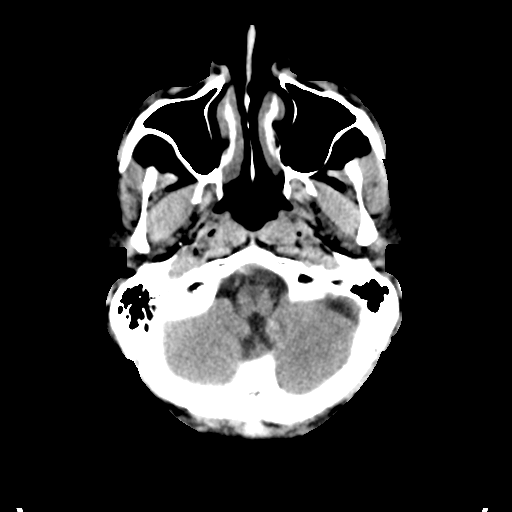
[im 5/34  bone]
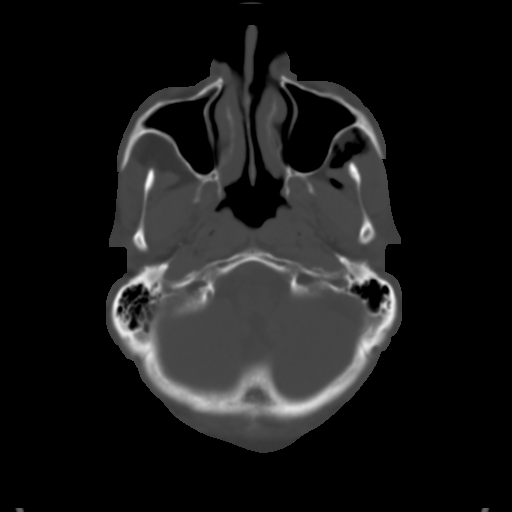
[im 9/34  brain]
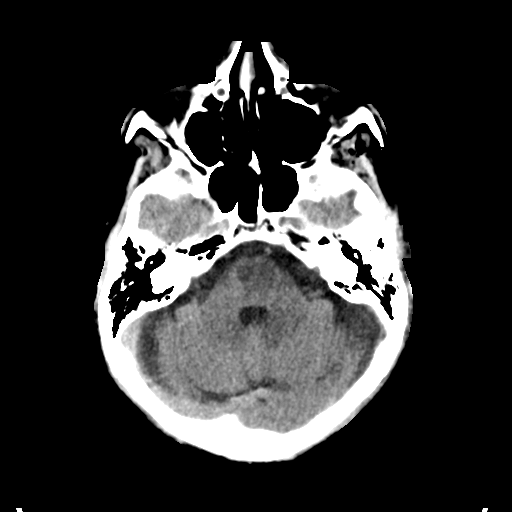
[im 13/34  brain]
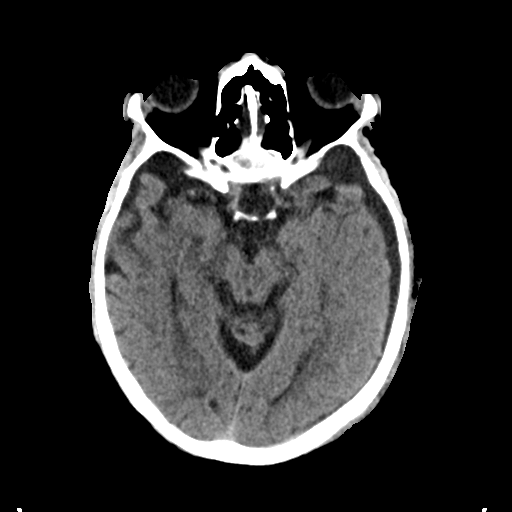
[im 17/34  brain]
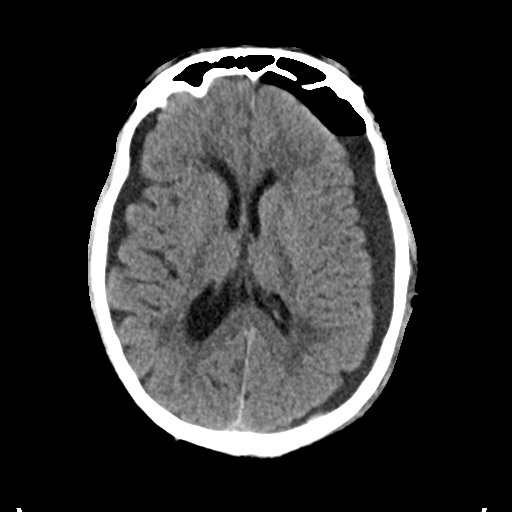
[im 21/34  brain]
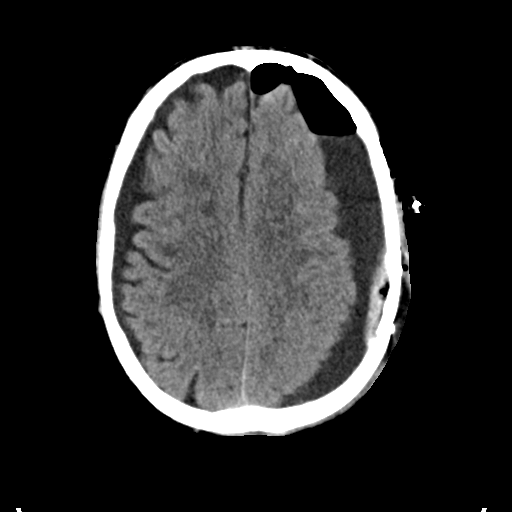
[im 21/34  bone]
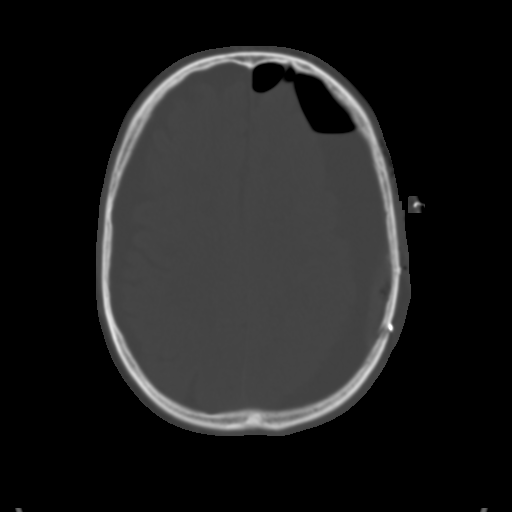
[im 25/34  brain]
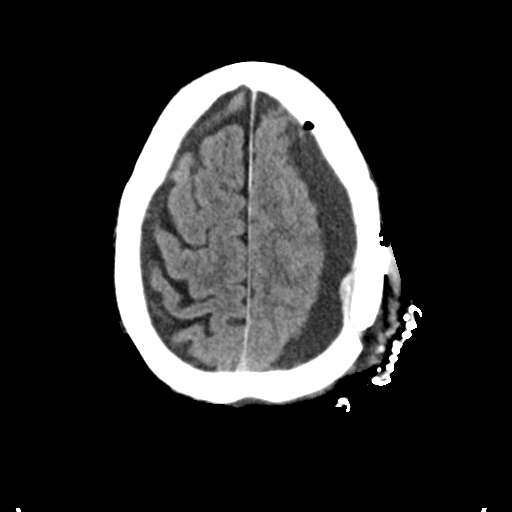
[im 29/34  brain]
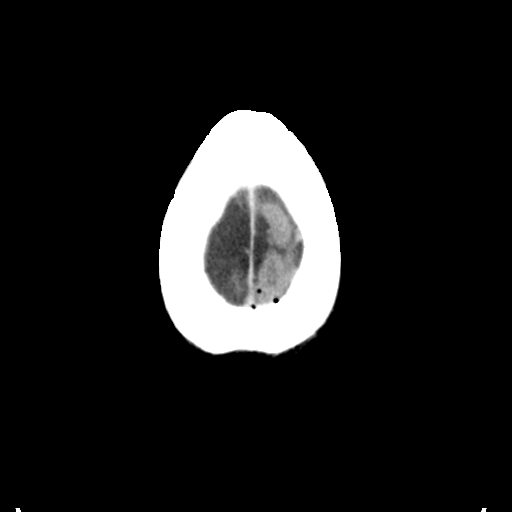

[Series 4: head bone · axial · 0.46mm/px · z∈[-132,-100]mm · 3 of 84 slices shown]
[im 9/84  bone]
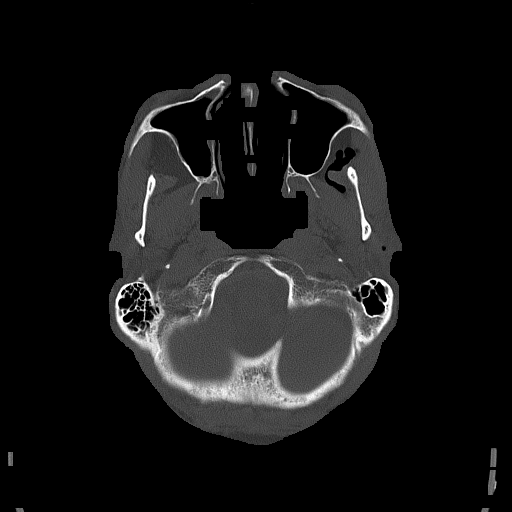
[im 17/84  bone]
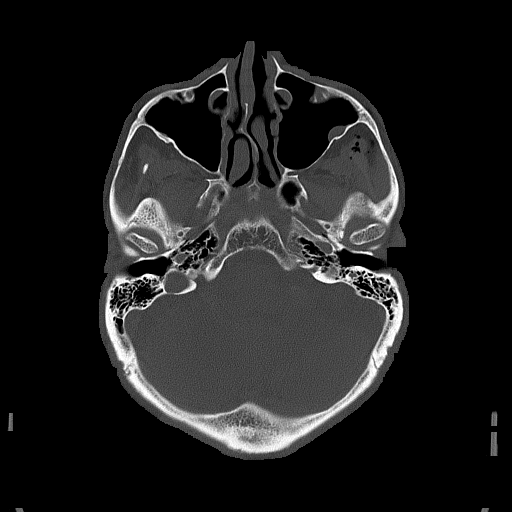
[im 25/84  bone]
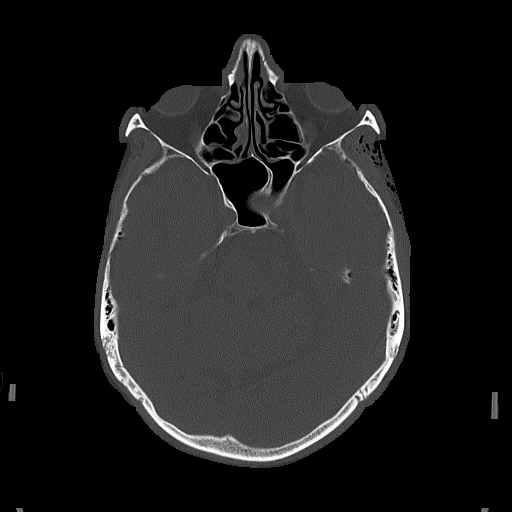

[Series 5: head without cor · coronal · non-contrast · 0.34mm/px · 3 of 76 slices shown]
[im 26/76  brain]
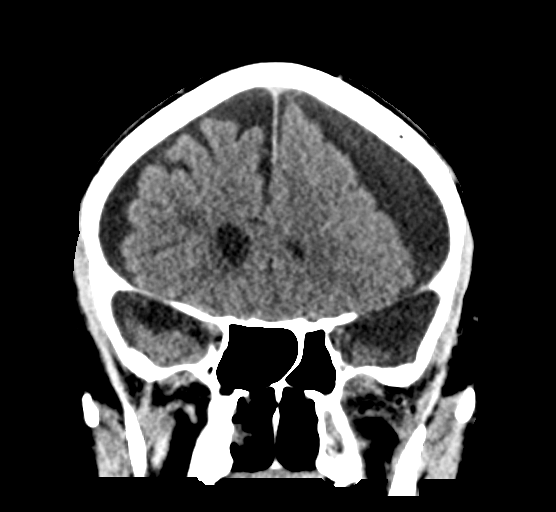
[im 34/76  brain]
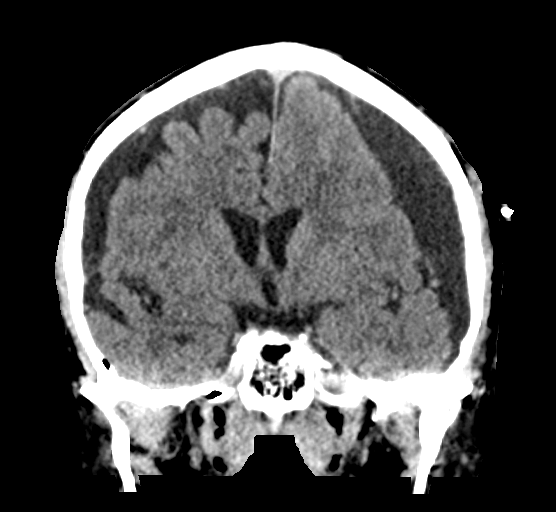
[im 42/76  brain]
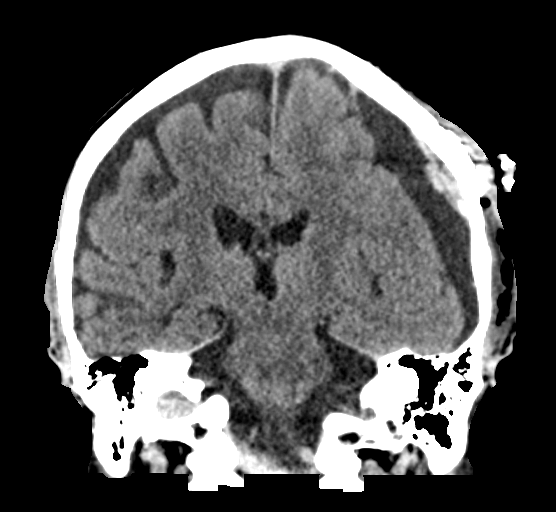

[Series 6: head without sag · sagittal · non-contrast · 0.33mm/px · 3 of 61 slices shown]
[im 21/61  brain]
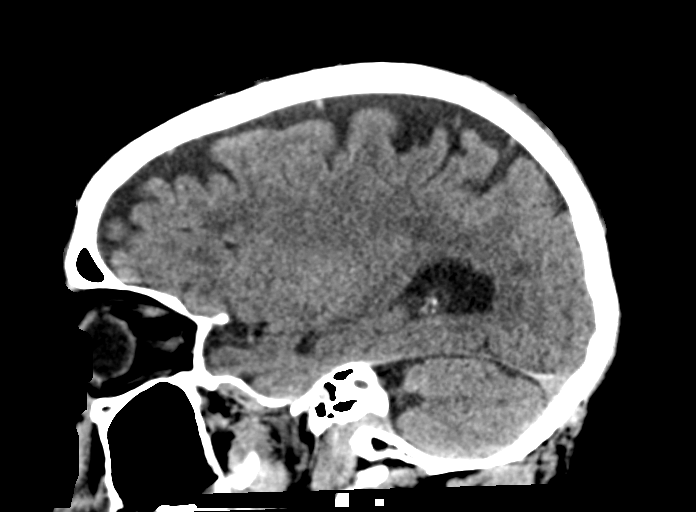
[im 31/61  brain]
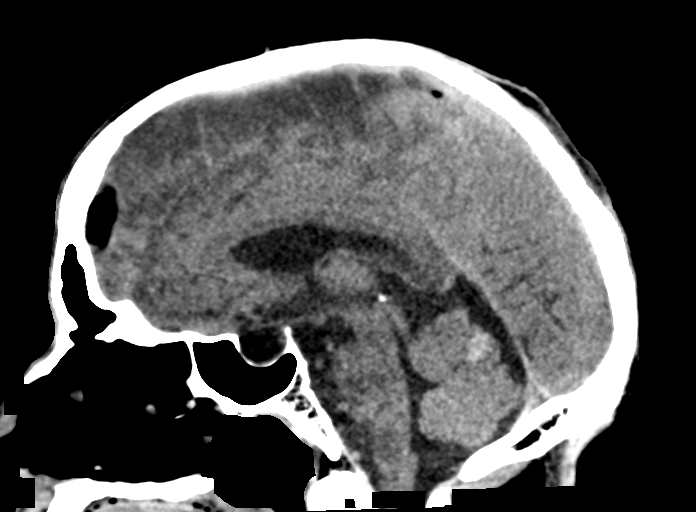
[im 41/61  brain]
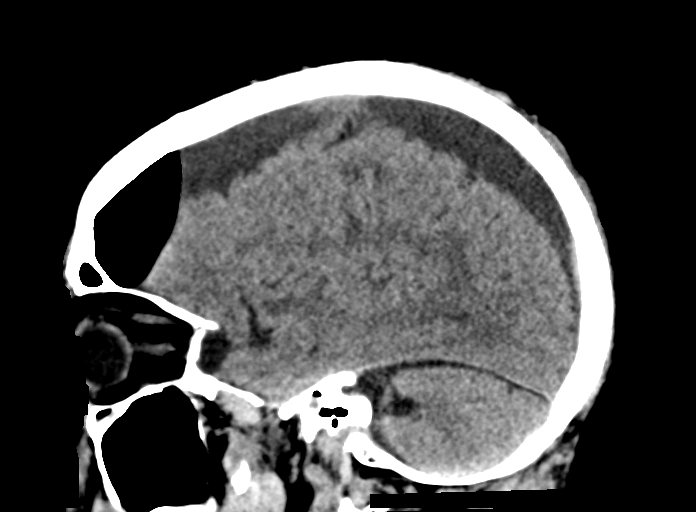

[16 of 47 positions shown; findings below may reference images not displayed]

FINDINGS: Brain: Is slight increase in size in the extra-axial collection over
the left hemisphere. Pneumocephalus is resolving. There is
effacement of the sulci on the left. Minimal midline shift is
stable. Small right extra-axial collection is stable.

Basal ganglia are intact. No acute or focal cortical infarct is
present. Scattered periventricular and subcortical white matter
hypoattenuation is stable.

There is expected evolution of hemorrhage in the left cerebellum and
vermis. Relatively empty sella is again noted.

Vascular: Atherosclerotic changes are present within the cavernous
internal carotid arteries. There is no hyperdense vessel

Skull: Left frontal parietal craniotomy is noted. Calvarium is
otherwise intact

Sinuses/Orbits: The paranasal sinuses and mastoid air cells are
clear. Globes and orbits are within normal limits.
IMPRESSION: 1. Continued slight increase in size of left extra-axial fluid
collection with persistent mass effect on the left hemisphere
2. Expected evolution hemorrhage in the left cerebellum and superior
vermis
3. Stable right extra-axial collection
4. Left parietal craniotomy
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
## 2019-07-06 NOTE — Progress Notes (Signed)
  NEUROSURGERY PROGRESS NOTE   Nursing reports mood swings, urine cloudy concerning for UTI. Complains of severe, lingering left sided HA. HA is different than prior HA. No associated changes in vision, N/V, N/T, weakness Endorses chronic urinary frequency. Denies dysuria.  EXAM:  BP (!) 165/78   Pulse (!) 43   Temp 98.5 F (36.9 C) (Oral)   Resp 11   Ht 6' 2.02" (1.88 m)   Wt 77.2 kg   SpO2 96%   BMI 21.83 kg/m   Awake, alert, oriented  Speech fluent, appropriate  CN grossly intact  5/5 BUE/BLE  Incision c/d/i  IMPRESSION/PLAN 68 y.o. male POD#6 s/p left crani for SDH. Much improved compared to yesterday although has significant headache. - HA: known reaccumulation on last CT. Will repeat today. - Urinary frequency: chronic. UA without obvious infection. Will await culture - Continue frequent neuro checks - Pt is good candidate for CI

## 2019-07-06 NOTE — Progress Notes (Signed)
Inpatient Rehabilitation Admissions Coordinator  I await updated therapy assessment today to be able to begin insurance authorization with [facility] for a possible inpt rehab admit. I met with patient at bedside and he states he wants to go home. Questions why he is unable to go home today. I clarified that he  remains in ICU and that doctors do not feel he is ready. He is less agreeable with me today than he was yesterday. I will follow.  Danne Baxter, RN, MSN Rehab Admissions Coordinator [phone] 07/06/2019 1:02 PM

## 2019-07-06 NOTE — Progress Notes (Signed)
  NEUROSURGERY PROGRESS NOTE   Updated patient's wife and daughter regarding current state and next steps. They do state he has a history of "mood swings" and was rx wellbutrin by PCP but refused to take due to stigma. Hopeful for rehab in next coupled days. Total time spent: 8 minutes

## 2019-07-06 NOTE — Progress Notes (Signed)
Assisted tele visit to patient with daughter and wife.  Lonnie [name], Philis Nettle, RN

## 2019-07-06 NOTE — Progress Notes (Signed)
Assisted tele visit to patient with wife.  Laken Lobato Parker, RN  

## 2019-07-06 NOTE — Progress Notes (Signed)
Spoke with patients daughter. She would like an update from the doctor, informed Ferne Reus PA. He will call and update.

## 2019-07-07 MED ORDER — NICOTINE 21 MG/24HR TD PT24
21.0000 mg | MEDICATED_PATCH | Freq: Every day | TRANSDERMAL | Status: DC
  Administered 2019-07-07 – 2019-07-08 (×2): 21 mg via TRANSDERMAL
  Filled 2019-07-07 (×2): qty 1

## 2019-07-07 NOTE — Progress Notes (Signed)
  NEUROSURGERY PROGRESS NOTE   Daughter updated today. She understands we are awaiting rehab at this point. She does point out he was smoking 2 packs per day. Will add nicotine patch.  Time spent 9 minutes

## 2019-07-07 NOTE — Progress Notes (Signed)
  NEUROSURGERY PROGRESS NOTE   No issues overnight.  Agitated this am due to lack of pain meds this am. Does not want to stay in the hospital any longer. Denies HA currently, N/V, N/T  EXAM:  BP (!) 149/70   Pulse (!) 42   Temp 98.4 F (36.9 C) (Oral)   Resp 11   Ht 6' 2.02" (1.88 m)   Wt 77.2 kg   SpO2 99%   BMI 21.83 kg/m   Awake, alert, oriented  Speech fluent, appropriate  CN grossly intact  5/5 BUE/BLE  Incision c/d/i  IMPRESSION/PLAN 68 y.o. male POD#7s/p left crani for SDH. Repeat head CT yesterday essentially unchanged. Stable neurologically. Agitated this am and demands to be discharged. - Urinary frequency: culture pending - Agitation: may need wife/daughter to come in to discuss CIR with him. Believe he would be a good candidate. He is medically ready for d/c to CIR if he/family are in agreement.
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
## 2019-07-07 NOTE — Progress Notes (Signed)
Assisted tele visit to patient with daughter.  Necole Minassian R, RN  

## 2019-07-08 ENCOUNTER — Encounter (HOSPITAL_COMMUNITY): Payer: Self-pay

## 2019-07-08 ENCOUNTER — Other Ambulatory Visit: Payer: Self-pay

## 2019-07-08 ENCOUNTER — Inpatient Hospital Stay (HOSPITAL_COMMUNITY)
Admission: RE | Admit: 2019-07-08 | Discharge: 2019-07-23 | DRG: 057 | Disposition: A | Discharge status: Home Health | Payer: Medicare HMO | Source: Intra-hospital | Attending: Physical Medicine & Rehabilitation | Admitting: Physical Medicine & Rehabilitation

## 2019-07-08 DIAGNOSIS — Z91012 Allergy to eggs: Secondary | ICD-10-CM

## 2019-07-08 DIAGNOSIS — I739 Peripheral vascular disease, unspecified: Secondary | ICD-10-CM | POA: Diagnosis present

## 2019-07-08 DIAGNOSIS — Z8249 Family history of ischemic heart disease and other diseases of the circulatory system: Secondary | ICD-10-CM

## 2019-07-08 DIAGNOSIS — I255 Ischemic cardiomyopathy: Secondary | ICD-10-CM | POA: Diagnosis present

## 2019-07-08 DIAGNOSIS — I2510 Atherosclerotic heart disease of native coronary artery without angina pectoris: Secondary | ICD-10-CM | POA: Diagnosis present

## 2019-07-08 DIAGNOSIS — R0989 Other specified symptoms and signs involving the circulatory and respiratory systems: Secondary | ICD-10-CM | POA: Diagnosis not present

## 2019-07-08 DIAGNOSIS — S069XAA Unspecified intracranial injury with loss of consciousness status unknown, initial encounter: Secondary | ICD-10-CM

## 2019-07-08 DIAGNOSIS — Z888 Allergy status to other drugs, medicaments and biological substances status: Secondary | ICD-10-CM

## 2019-07-08 DIAGNOSIS — Z951 Presence of aortocoronary bypass graft: Secondary | ICD-10-CM | POA: Diagnosis not present

## 2019-07-08 DIAGNOSIS — R69 Illness, unspecified: Secondary | ICD-10-CM | POA: Diagnosis not present

## 2019-07-08 DIAGNOSIS — R001 Bradycardia, unspecified: Secondary | ICD-10-CM | POA: Diagnosis not present

## 2019-07-08 DIAGNOSIS — K5900 Constipation, unspecified: Secondary | ICD-10-CM | POA: Diagnosis present

## 2019-07-08 DIAGNOSIS — G8191 Hemiplegia, unspecified affecting right dominant side: Secondary | ICD-10-CM | POA: Diagnosis not present

## 2019-07-08 DIAGNOSIS — T83511D Infection and inflammatory reaction due to indwelling urethral catheter, subsequent encounter: Secondary | ICD-10-CM | POA: Diagnosis not present

## 2019-07-08 DIAGNOSIS — I10 Essential (primary) hypertension: Secondary | ICD-10-CM | POA: Diagnosis present

## 2019-07-08 DIAGNOSIS — F17210 Nicotine dependence, cigarettes, uncomplicated: Secondary | ICD-10-CM | POA: Diagnosis present

## 2019-07-08 DIAGNOSIS — S065X9A Traumatic subdural hemorrhage with loss of consciousness of unspecified duration, initial encounter: Secondary | ICD-10-CM | POA: Diagnosis present

## 2019-07-08 DIAGNOSIS — Z91010 Allergy to peanuts: Secondary | ICD-10-CM | POA: Diagnosis not present

## 2019-07-08 DIAGNOSIS — S065X0S Traumatic subdural hemorrhage without loss of consciousness, sequela: Secondary | ICD-10-CM

## 2019-07-08 DIAGNOSIS — G8918 Other acute postprocedural pain: Secondary | ICD-10-CM | POA: Diagnosis not present

## 2019-07-08 DIAGNOSIS — Z91011 Allergy to milk products: Secondary | ICD-10-CM

## 2019-07-08 DIAGNOSIS — R339 Retention of urine, unspecified: Secondary | ICD-10-CM | POA: Diagnosis not present

## 2019-07-08 DIAGNOSIS — S069X0A Unspecified intracranial injury without loss of consciousness, initial encounter: Secondary | ICD-10-CM | POA: Diagnosis not present

## 2019-07-08 DIAGNOSIS — Z79899 Other long term (current) drug therapy: Secondary | ICD-10-CM | POA: Diagnosis not present

## 2019-07-08 DIAGNOSIS — I252 Old myocardial infarction: Secondary | ICD-10-CM

## 2019-07-08 DIAGNOSIS — R451 Restlessness and agitation: Secondary | ICD-10-CM | POA: Diagnosis not present

## 2019-07-08 DIAGNOSIS — I6200 Nontraumatic subdural hemorrhage, unspecified: Secondary | ICD-10-CM | POA: Diagnosis not present

## 2019-07-08 DIAGNOSIS — G3109 Other frontotemporal dementia: Secondary | ICD-10-CM | POA: Diagnosis not present

## 2019-07-08 DIAGNOSIS — M25512 Pain in left shoulder: Secondary | ICD-10-CM | POA: Diagnosis not present

## 2019-07-08 DIAGNOSIS — M545 Low back pain, unspecified: Secondary | ICD-10-CM

## 2019-07-08 DIAGNOSIS — Z7982 Long term (current) use of aspirin: Secondary | ICD-10-CM | POA: Diagnosis not present

## 2019-07-08 DIAGNOSIS — S069X0S Unspecified intracranial injury without loss of consciousness, sequela: Secondary | ICD-10-CM | POA: Diagnosis not present

## 2019-07-08 DIAGNOSIS — N390 Urinary tract infection, site not specified: Secondary | ICD-10-CM | POA: Diagnosis not present

## 2019-07-08 DIAGNOSIS — F0281 Dementia in other diseases classified elsewhere with behavioral disturbance: Secondary | ICD-10-CM | POA: Diagnosis not present

## 2019-07-08 DIAGNOSIS — I2583 Coronary atherosclerosis due to lipid rich plaque: Secondary | ICD-10-CM | POA: Diagnosis not present

## 2019-07-08 DIAGNOSIS — S069X9A Unspecified intracranial injury with loss of consciousness of unspecified duration, initial encounter: Secondary | ICD-10-CM

## 2019-07-08 DIAGNOSIS — B9620 Unspecified Escherichia coli [E. coli] as the cause of diseases classified elsewhere: Secondary | ICD-10-CM | POA: Diagnosis present

## 2019-07-08 DIAGNOSIS — K5901 Slow transit constipation: Secondary | ICD-10-CM | POA: Diagnosis not present

## 2019-07-08 DIAGNOSIS — G9389 Other specified disorders of brain: Secondary | ICD-10-CM | POA: Diagnosis present

## 2019-07-08 DIAGNOSIS — S065XAA Traumatic subdural hemorrhage with loss of consciousness status unknown, initial encounter: Secondary | ICD-10-CM

## 2019-07-08 DIAGNOSIS — G8929 Other chronic pain: Secondary | ICD-10-CM | POA: Diagnosis present

## 2019-07-08 MED ORDER — IRBESARTAN 300 MG PO TABS
300.0000 mg | ORAL_TABLET | Freq: Every day | ORAL | Status: DC
  Administered 2019-07-09 – 2019-07-23 (×15): 300 mg via ORAL
  Filled 2019-07-08 (×15): qty 1

## 2019-07-08 MED ORDER — AZELASTINE HCL 0.1 % NA SOLN: 2.0000 | Freq: Every evening | NASAL | Status: DC | PRN

## 2019-07-08 MED ORDER — PROCHLORPERAZINE 25 MG RE SUPP: 12.5000 mg | Freq: Four times a day (QID) | RECTAL | Status: DC | PRN

## 2019-07-08 MED ORDER — BISACODYL 10 MG RE SUPP: 10.0000 mg | Freq: Every day | RECTAL | Status: DC | PRN

## 2019-07-08 MED ORDER — DIPHENHYDRAMINE HCL 12.5 MG/5ML PO ELIX: 12.5000 mg | ORAL_SOLUTION | Freq: Four times a day (QID) | ORAL | Status: DC | PRN

## 2019-07-08 MED ORDER — PROCHLORPERAZINE MALEATE 5 MG PO TABS: 5.0000 mg | ORAL_TABLET | Freq: Four times a day (QID) | ORAL | Status: DC | PRN

## 2019-07-08 MED ORDER — ACETAMINOPHEN 325 MG PO TABS
650.0000 mg | ORAL_TABLET | ORAL | Status: DC | PRN
  Administered 2019-07-09 – 2019-07-23 (×5): 650 mg via ORAL
  Filled 2019-07-08 (×2): qty 2

## 2019-07-08 MED ORDER — GUAIFENESIN-DM 100-10 MG/5ML PO SYRP: 5.0000 mL | ORAL_SOLUTION | Freq: Four times a day (QID) | ORAL | Status: DC | PRN

## 2019-07-08 MED ORDER — ACETAMINOPHEN 650 MG RE SUPP
650.0000 mg | RECTAL | Status: DC | PRN
  Filled 2019-07-08: qty 1

## 2019-07-08 MED ORDER — ATORVASTATIN CALCIUM 80 MG PO TABS
80.0000 mg | ORAL_TABLET | Freq: Every day | ORAL | Status: DC
  Administered 2019-07-08 – 2019-07-22 (×15): 80 mg via ORAL
  Filled 2019-07-08 (×15): qty 1

## 2019-07-08 MED ORDER — PANTOPRAZOLE SODIUM 40 MG PO TBEC
40.0000 mg | DELAYED_RELEASE_TABLET | Freq: Every day | ORAL | Status: DC
  Administered 2019-07-08 – 2019-07-22 (×14): 40 mg via ORAL
  Filled 2019-07-08 (×14): qty 1

## 2019-07-08 MED ORDER — HYDROCODONE-ACETAMINOPHEN 5-325 MG PO TABS
1.0000 | ORAL_TABLET | ORAL | Status: DC | PRN
  Administered 2019-07-08 – 2019-07-11 (×10): 1 via ORAL
  Filled 2019-07-08 (×10): qty 1

## 2019-07-08 MED ORDER — DOCUSATE SODIUM 100 MG PO CAPS
100.0000 mg | ORAL_CAPSULE | Freq: Two times a day (BID) | ORAL | Status: DC
  Administered 2019-07-08 – 2019-07-23 (×30): 100 mg via ORAL
  Filled 2019-07-08 (×30): qty 1

## 2019-07-08 MED ORDER — TRAZODONE HCL 50 MG PO TABS
25.0000 mg | ORAL_TABLET | Freq: Every evening | ORAL | Status: DC | PRN
  Administered 2019-07-11 – 2019-07-18 (×6): 50 mg via ORAL
  Filled 2019-07-08 (×6): qty 1

## 2019-07-08 MED ORDER — PROCHLORPERAZINE EDISYLATE 10 MG/2ML IJ SOLN: 5.0000 mg | Freq: Four times a day (QID) | INTRAMUSCULAR | Status: DC | PRN

## 2019-07-08 MED ORDER — POLYETHYLENE GLYCOL 3350 17 G PO PACK
17.0000 g | PACK | Freq: Every day | ORAL | Status: DC | PRN
  Administered 2019-07-09: 17 g via ORAL
  Filled 2019-07-08: qty 1

## 2019-07-08 MED ORDER — ACETAMINOPHEN 325 MG PO TABS
325.0000 mg | ORAL_TABLET | ORAL | Status: DC | PRN
  Administered 2019-07-08: 650 mg via ORAL
  Administered 2019-07-10 (×2): 325 mg via ORAL
  Filled 2019-07-08 (×8): qty 2

## 2019-07-08 MED ORDER — METOPROLOL TARTRATE 25 MG PO TABS
25.0000 mg | ORAL_TABLET | Freq: Two times a day (BID) | ORAL | Status: DC
  Administered 2019-07-08 – 2019-07-23 (×30): 25 mg via ORAL
  Filled 2019-07-08 (×30): qty 1

## 2019-07-08 MED ORDER — BISACODYL 5 MG PO TBEC
5.0000 mg | DELAYED_RELEASE_TABLET | Freq: Every day | ORAL | Status: DC | PRN
  Administered 2019-07-14: 5 mg via ORAL
  Filled 2019-07-08 (×2): qty 1

## 2019-07-08 MED ORDER — ALUM & MAG HYDROXIDE-SIMETH 200-200-20 MG/5ML PO SUSP: 30.0000 mL | ORAL | Status: DC | PRN

## 2019-07-08 MED ORDER — CALCIUM CARBONATE-VITAMIN D 500-200 MG-UNIT PO TABS
1.0000 | ORAL_TABLET | Freq: Every day | ORAL | Status: DC
  Administered 2019-07-09 – 2019-07-23 (×15): 1 via ORAL
  Filled 2019-07-08 (×15): qty 1

## 2019-07-08 MED ORDER — FLEET ENEMA 7-19 GM/118ML RE ENEM: 1.0000 | ENEMA | Freq: Once | RECTAL | Status: DC | PRN
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
## 2019-07-08 NOTE — Progress Notes (Signed)
Pt admitted to unit via bed from 4N05. Side rails x 3. Oriented to room and safety plan/precautions. Call bell placed in reach. Will cont to monitor.   Erie Noe, RN

## 2019-07-08 NOTE — Progress Notes (Addendum)
  NEUROSURGERY PROGRESS NOTE   No issues overnight.  HA intermittent, fluctuates in severity Denies N/V, N/T  EXAM:  BP (!) 162/87 (BP Location: Left Arm)   Pulse 64   Temp 97.6 F (36.4 C) (Temporal)   Resp 16   Ht 6' 2.02" (1.88 m)   Wt 77.2 kg   SpO2 99%   BMI 21.83 kg/m   Awake, alert, oriented  Speech fluent, appropriate  CN grossly intact  5/5 BUE/BLE  Incision c/d/i, pseudomeningocele without drainage  IMPRESSION/PLAN 68 y.o. male POD#8s/p left crani for SDH. Doing well today. - Urinary frequency: culture pending - Dispo planning. Stable for d/c to CIR if insurance approval obtained today. Orders entered for d/c - would rec repeat head CT in 2 weeks if still in rehab for monitoring of SDH
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
## 2019-07-08 NOTE — Progress Notes (Signed)
Inpatient Rehabilitation Admissions Coordinator  I await [employer] insurance approval for possible admit to inpt rehab today. I met with patient at bedside and he is in agreement, but wants to rehab and get home asap. I will follow up today.  Danne Baxter, RN, MSN Rehab Admissions Coordinator [phone] 07/08/2019 10:05 AM

## 2019-07-08 NOTE — Progress Notes (Signed)
Inpatient Rehabilitation Admissions Coordinator  I have received approval from [facility] for CIR admit. I met with patient and he is agreement to admit. I have notified daughter, [name], by phone. I will make the arrangements to admit today.  Danne Baxter, RN, MSN Rehab Admissions Coordinator [phone] 07/08/2019 3:52 PM
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
## 2019-07-09 ENCOUNTER — Inpatient Hospital Stay (HOSPITAL_COMMUNITY): Payer: Medicare HMO

## 2019-07-09 ENCOUNTER — Inpatient Hospital Stay (HOSPITAL_COMMUNITY): Payer: Medicare HMO | Admitting: Speech Pathology

## 2019-07-09 DIAGNOSIS — S065X9A Traumatic subdural hemorrhage with loss of consciousness of unspecified duration, initial encounter: Secondary | ICD-10-CM

## 2019-07-09 LAB — COMPREHENSIVE METABOLIC PANEL
ALT: 31 U/L (ref 0–44)
AST: 24 U/L (ref 15–41)
Albumin: 3 g/dL — ABNORMAL LOW (ref 3.5–5.0)
Alkaline Phosphatase: 67 U/L (ref 38–126)
Anion gap: 7 (ref 5–15)
BUN: 14 mg/dL (ref 8–23)
CO2: 25 mmol/L (ref 22–32)
Calcium: 9 mg/dL (ref 8.9–10.3)
Chloride: 105 mmol/L (ref 98–111)
Creatinine, Ser: 0.99 mg/dL (ref 0.61–1.24)
GFR calc Af Amer: >60 mL/min (ref >60)
GFR calc non Af Amer: >60 mL/min (ref >60)
Glucose, Bld: 98 mg/dL (ref 70–99)
Potassium: 4 mmol/L (ref 3.5–5.1)
Sodium: 137 mmol/L (ref 135–145)
Total Bilirubin: 1.1 mg/dL (ref 0.3–1.2)
Total Protein: 5.8 g/dL — ABNORMAL LOW (ref 6.5–8.1)

## 2019-07-09 LAB — CBC WITH DIFFERENTIAL/PLATELET
Abs Immature Granulocytes: 0.15 10*3/uL — ABNORMAL HIGH (ref 0.00–0.07)
Basophils Absolute: 0.1 10*3/uL (ref 0.0–0.1)
Basophils Relative: 1 %
Eosinophils Absolute: 0.4 10*3/uL (ref 0.0–0.5)
Eosinophils Relative: 4 %
HCT: 44.9 % (ref 39.0–52.0)
Hemoglobin: 14.5 g/dL (ref 13.0–17.0)
Immature Granulocytes: 2 %
Lymphocytes Relative: 25 %
Lymphs Abs: 2.5 10*3/uL (ref 0.7–4.0)
MCH: 27.3 pg (ref 26.0–34.0)
MCHC: 32.3 g/dL (ref 30.0–36.0)
MCV: 84.6 fL (ref 80.0–100.0)
Monocytes Absolute: 0.8 10*3/uL (ref 0.1–1.0)
Monocytes Relative: 8 %
Neutro Abs: 6.1 10*3/uL (ref 1.7–7.7)
Neutrophils Relative %: 60 %
Platelets: 269 10*3/uL (ref 150–400)
RBC: 5.31 MIL/uL (ref 4.22–5.81)
RDW: 13 % (ref 11.5–15.5)
WBC: 10 10*3/uL (ref 4.0–10.5)
nRBC: 0 % (ref 0.0–0.2)
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
## 2019-07-09 NOTE — Progress Notes (Signed)
[facility] PHYSICAL MEDICINE & REHABILITATION PROGRESS NOTE   Subjective/Complaints:  Slept ok except alarms went off Left shoulder pain when he rests it on side rail   ROS- neg for CP SOB, N/V/D Objective:   No results found. Recent Labs    07/09/19 0525  WBC 10.0  HGB 14.5  HCT 44.9  PLT 269   Recent Labs    07/09/19 0525  NA 137  K 4.0  CL 105  CO2 25  GLUCOSE 98  BUN 14  CREATININE 0.99  CALCIUM 9.0    Intake/Output Summary (Last 24 hours) at 07/09/2019 0741 Last data filed at 07/09/2019 0440 Gross per 24 hour  Intake -  Output 950 ml  Net -950 ml     Physical Exam: Vital Signs Blood pressure (!) 190/87, pulse (!) 52, temperature 98.2 F (36.8 C), temperature source Oral, resp. rate 16, height 6\' 2"  (1.88 m), weight 75.2 kg, SpO2 100 %.   General: No acute distress Mood and affect are appropriate Heart: Regular rate and rhythm no rubs murmurs or extra sounds Lungs: Clear to auscultation, breathing unlabored, no rales or wheezes Abdomen: Positive bowel sounds, soft nontender to palpation, nondistended Extremities: No clubbing, cyanosis, or edema Skin: No evidence of breakdown, Left scalp staples CDI  Neurologic: Cranial nerves II through XII intact, motor strength is 5/5 in bilateral deltoid, bicep, tricep, grip, hip flexor, knee extensors, ankle dorsiflexor and plantar flexor Sensory exam normal sensation to light touch and proprioception in bilateral upper and lower extremities Mild confusion, thought he was going home and  Coming back for OP therapy  Musculoskeletal: Full range of motion in all 4 extremities. No joint swelling   Assessment/Plan: 1. Functional deficits secondary to L SDH  which require 3+ hours per day of interdisciplinary therapy in a comprehensive inpatient rehab setting.  Physiatrist is providing close team supervision and 24 hour management of active medical problems listed below.  Physiatrist and rehab team continue to assess  barriers to discharge/monitor patient progress toward functional and medical goals  Care Tool:  Bathing              Bathing assist       Upper Body Dressing/Undressing Upper body dressing        Upper body assist      Lower Body Dressing/Undressing Lower body dressing            Lower body assist       Toileting Toileting    Toileting assist       Transfers Chair/bed transfer  Transfers assist           Locomotion Ambulation   Ambulation assist              Walk 10 feet activity   Assist           Walk 50 feet activity   Assist           Walk 150 feet activity   Assist           Walk 10 feet on uneven surface  activity   Assist           Wheelchair     Assist               Wheelchair 50 feet with 2 turns activity    Assist            Wheelchair 150 feet activity     Assist  Medical Problem List and Plan: 1.Functional deficits and right hemiparesissecondary to left fronto-parietal SDH CIR PT, OT, SLP evals  2. Antithrombotics: -DVT/anticoagulation:Mechanical:Sequential compression devices, below kneeBilateral lower extremities -antiplatelet therapy:  3. Pain Management:tylenol prn.c/o left shoulder pain but exam is neg, OT will see if some fxnl activity may aggravate 4. Mood:LCSW to follow for evaluation and support. -antipsychotic agents: N/A 5. Neuropsych: This patientiscapable of making decisions on hisown behalf. 6. Skin/Wound Care:Routine pressure relief measures. 7. Fluids/Electrolytes/Nutrition:Monitor I/O. Check lytes in am.  8. HTN: Monitor BP tid--continueAvapro and metoprolol. 9. CAD s/p CABG:Continue Lipitor. 10. Bradycardia: --monitor for symptoms. Will set parameters on BB. Vitals:   07/08/19 1950 07/09/19 0438  BP: (!) 187/83 (!) 190/87  Pulse: 71 (!) 52  Resp: 17 16  Temp: 98.4 F  (36.9 C) 98.2 F (36.8 C)  SpO2: 100% 100%  elevated systolic has reportedly been on another BP med in past but was allergic (ACE-I)11.Tobacco use: On nicotine patch. 12. Constipation: Has not had BM since last Sunday.Refusing laxative at this time--wants to continue stool softner only. -encourage ample liquids as well as fruits and veggies  LOS: 1 days A FACE TO FACE EVALUATION WAS PERFORMED  Erick Colacendrew E Kirsteins 07/09/2019, 7:41 AM
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
## 2019-07-09 NOTE — Plan of Care (Signed)
  Problem: RH BOWEL ELIMINATION Goal: RH STG MANAGE BOWEL WITH ASSISTANCE Description: STG Manage Bowel with min Assistance. Outcome: Not Progressing; patient claims he had a bowel movement today
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
## 2019-07-10 ENCOUNTER — Inpatient Hospital Stay (HOSPITAL_COMMUNITY): Payer: Medicare HMO

## 2019-07-10 NOTE — Progress Notes (Signed)
@  2010, pt's significant other called and alerted nurse of pt feeling agitated and requested Adderall for treatment. After discussing with pt, pt is distressed and emotional due to his hospitalization, coupled with his on-going left shoulder pain. Pt was given support and pain was treated.  Pt described his hospital stay as, "I feel like I'm trapped in a prison," and wasn't happy that his wife would not 'break him out of here.'   @0410 , pt stated that he wants to go home today. Pt was able to be calmed down but is still adamant on leaving today.

## 2019-07-10 NOTE — Progress Notes (Signed)
Patient became agitated and wanting to go home to get his clothes for therapy in the morning. RN explained to patient and he calmed down. Complained of headache and shoulder pain all day today relieved with pain med.

## 2019-07-10 NOTE — Progress Notes (Signed)
[facility] PHYSICAL MEDICINE & REHABILITATION PROGRESS NOTE   Subjective/Complaints:  "I hate this kind of place"  Pt aggravated about the bed alarm. Complaints of shoulder pain 1/10 at rest but increased with elevation to 90 degrees, denies prior pain in left shoulder   ROS- neg for CP SOB, N/V/D Objective:   No results found. Recent Labs    07/09/19 0525  WBC 10.0  HGB 14.5  HCT 44.9  PLT 269   Recent Labs    07/09/19 0525  NA 137  K 4.0  CL 105  CO2 25  GLUCOSE 98  BUN 14  CREATININE 0.99  CALCIUM 9.0    Intake/Output Summary (Last 24 hours) at 07/10/2019 0809 Last data filed at 07/10/2019 0419 Gross per 24 hour  Intake 240 ml  Output 1000 ml  Net -760 ml     Physical Exam: Vital Signs Blood pressure (!) 144/86, pulse (!) 50, temperature 98.7 F (37.1 C), temperature source Oral, resp. rate 12, height 6\' 2"  (1.88 m), weight 75.2 kg, SpO2 100 %.   General: No acute distress Mood and affect are appropriate Heart: Regular rate and rhythm no rubs murmurs or extra sounds Lungs: Clear to auscultation, breathing unlabored, no rales or wheezes Abdomen: Positive bowel sounds, soft nontender to palpation, nondistended Extremities: No clubbing, cyanosis, or edema Skin: No evidence of breakdown, Left scalp staples CDI  Neurologic: Cranial nerves II through XII intact, motor strength is 5/5 in bilateral deltoid, bicep, tricep, grip, hip flexor, knee extensors, ankle dorsiflexor and plantar flexor Sensory exam normal sensation to light touch and proprioception in bilateral upper and lower extremities Mild confusion, thought he was going home and  Coming back for OP therapy  Musculoskeletal: + impingement Left shoulder neg O briens   Assessment/Plan: 1. Functional deficits secondary to L SDH  which require 3+ hours per day of interdisciplinary therapy in a comprehensive inpatient rehab setting.  Physiatrist is providing close team supervision and 24 hour management  of active medical problems listed below.  Physiatrist and rehab team continue to assess barriers to discharge/monitor patient progress toward functional and medical goals  Care Tool:  Bathing    Body parts bathed by patient: Right arm, Left arm, Abdomen, Chest, Front perineal area, Buttocks, Right upper leg, Left upper leg, Right lower leg, Left lower leg, Face         Bathing assist Assist Level: Supervision/Verbal cueing     Upper Body Dressing/Undressing Upper body dressing   What is the patient wearing?: Pull over shirt    Upper body assist Assist Level: Supervision/Verbal cueing    Lower Body Dressing/Undressing Lower body dressing      What is the patient wearing?: Pants     Lower body assist Assist for lower body dressing: Contact Guard/Touching assist     Toileting Toileting    Toileting assist Assist for toileting: Minimal Assistance - Patient > 75%     Transfers Chair/bed transfer  Transfers assist     Chair/bed transfer assist level: Minimal Assistance - Patient > 75% Chair/bed transfer assistive device: Programmer, multimedia   Ambulation assist      Assist level: Contact Guard/Touching assist Assistive device: Walker-rolling Max distance: 15'(limited by pain (headache))   Walk 10 feet activity   Assist     Assist level: Contact Guard/Touching assist Assistive device: Walker-rolling   Walk 50 feet activity   Assist Walk 50 feet with 2 turns activity did not occur: Safety/medical concerns(limited by pain (headache))  Walk 150 feet activity   Assist Walk 150 feet activity did not occur: Safety/medical concerns(limited by pain (headache))         Walk 10 feet on uneven surface  activity   Assist Walk 10 feet on uneven surfaces activity did not occur: Safety/medical concerns(limited by pain (headache))         Wheelchair     Assist Will patient use wheelchair at discharge?: No   Wheelchair  activity did not occur: N/A(Patient is ambulatory)         Wheelchair 50 feet with 2 turns activity    Assist    Wheelchair 50 feet with 2 turns activity did not occur: N/A(Patient is ambulatory)       Wheelchair 150 feet activity     Assist Wheelchair 150 feet activity did not occur: N/A(Patient is ambulatory)          Medical Problem List and Plan: 1.Functional deficits and right hemiparesissecondary to left fronto-parietal SDH CIR PT, OT, SLP evals   TBI, easily aggitated but responds to redirection, some lability as well, ask for neuropsych eval  2. Antithrombotics: -DVT/anticoagulation:Mechanical:Sequential compression devices, below kneeBilateral lower extremities -antiplatelet therapy:  3. Pain Management:tylenol prn.c/o left shoulder pain but exam is neg, check Xray , suspect impingement OT will see if some fxnl activity may aggravate 4. Mood:LCSW to follow for evaluation and support. -antipsychotic agents: N/A 5. Neuropsych: This patientiscapable of making decisions on hisown behalf. 6. Skin/Wound Care:Routine pressure relief measures. 7. Fluids/Electrolytes/Nutrition:Monitor I/O. Check lytes in am.  8. HTN: Monitor BP tid--continueAvapro and metoprolol. 9. CAD s/p CABG:Continue Lipitor. 10. Bradycardia: --monitor for symptoms. Will set parameters on BB. Vitals:   07/09/19 1958 07/10/19 0415  BP: (!) 118/97 (!) 144/86  Pulse: 62 (!) 50  Resp:  12  Temp: 98.7 F (37.1 C) 98.7 F (37.1 C)  SpO2: 100% 100%  elevated systolic has reportedly been on another BP med in past but was allergic (ACE-I)11.Tobacco use: On nicotine patch. 12. Constipation: Has not had BM since last Sunday.Refusing laxative at this time--wants to continue stool softner only. -encourage ample liquids as well as fruits and veggies  LOS: 2 days A FACE TO FACE EVALUATION WAS PERFORMED  Erick Colacendrew E   07/10/2019, 8:09 AM

## 2019-07-10 NOTE — Progress Notes (Signed)
Patient is cooperative at this time. RN addressed pain and patient is satisfied and calm.RN returned daughters call.

## 2019-07-11 ENCOUNTER — Inpatient Hospital Stay (HOSPITAL_COMMUNITY): Payer: Medicare HMO

## 2019-07-11 ENCOUNTER — Inpatient Hospital Stay (HOSPITAL_COMMUNITY): Payer: Medicare HMO | Admitting: Occupational Therapy

## 2019-07-11 LAB — URINALYSIS, ROUTINE W REFLEX MICROSCOPIC
Bilirubin Urine: NEGATIVE
Glucose, UA: NEGATIVE mg/dL
Ketones, ur: NEGATIVE mg/dL
Nitrite: POSITIVE — AB
Protein, ur: 30 mg/dL — AB
Specific Gravity, Urine: 1.016 (ref 1.005–1.030)
WBC, UA: >50 WBC/hpf — ABNORMAL HIGH (ref 0–5)
pH: 7 (ref 5.0–8.0)

## 2019-07-11 IMAGING — CT CT HEAD WITHOUT CONTRAST
3 series · 14 of 47 positions shown, 16 images · non-contrast
Comparison: Most recent head CT dated [DATE]

CLINICAL DATA: 68-year-old male with subdural hematoma follow-up.

EXAM:
CT HEAD WITHOUT CONTRAST
TECHNIQUE: Contiguous axial images were obtained from the base of the skull
through the vertex without intravenous contrast.

[Series 3: head 5.0 h30s · axial · 0.44mm/px · z∈[-139,-9]mm · 8 of 32 slices shown, 10 images]
[im 3/32  brain]
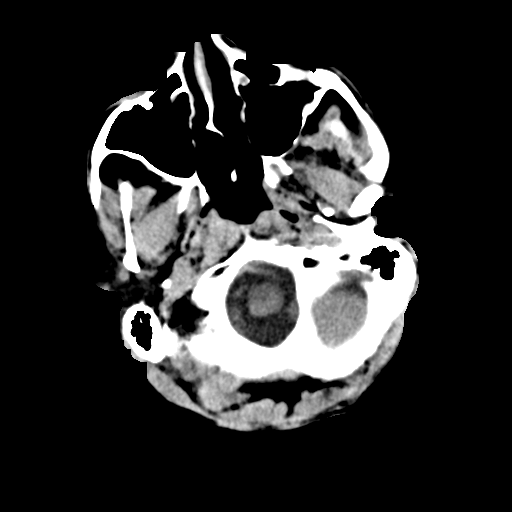
[im 3/32  bone]
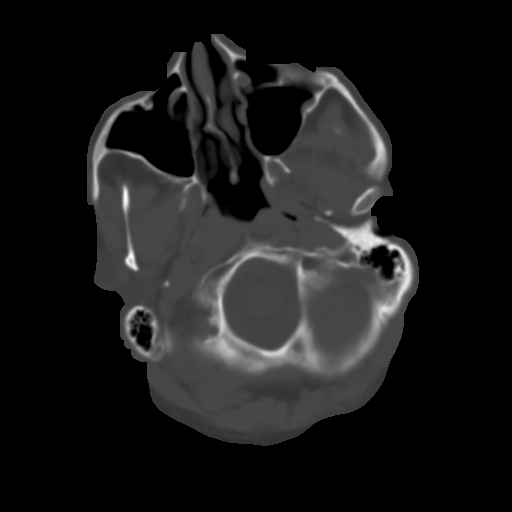
[im 7/32  brain]
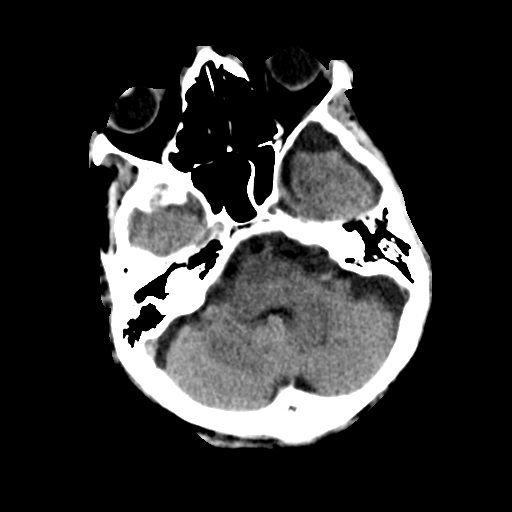
[im 10/32  brain]
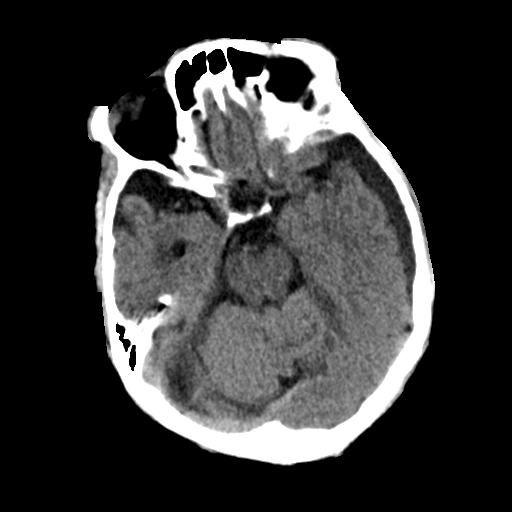
[im 14/32  brain]
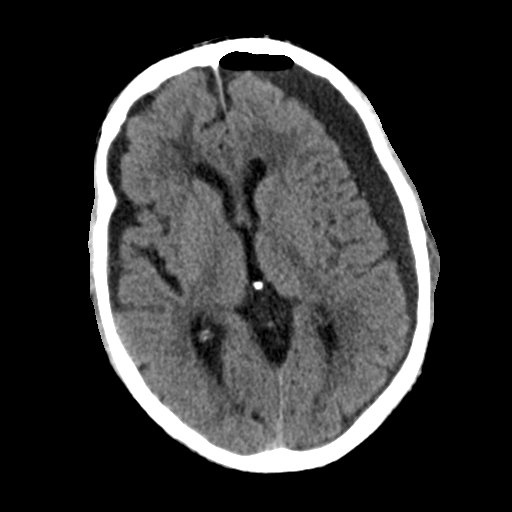
[im 18/32  brain]
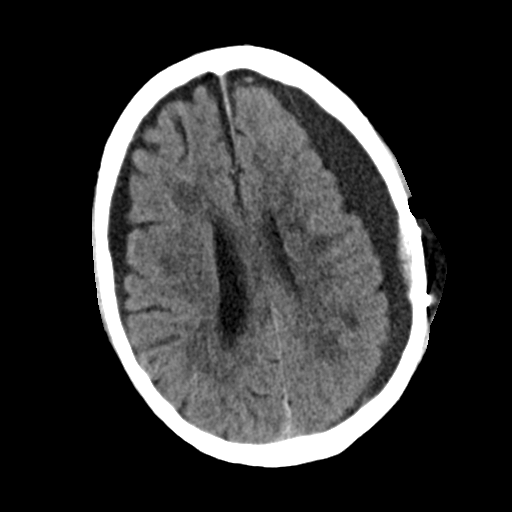
[im 18/32  bone]
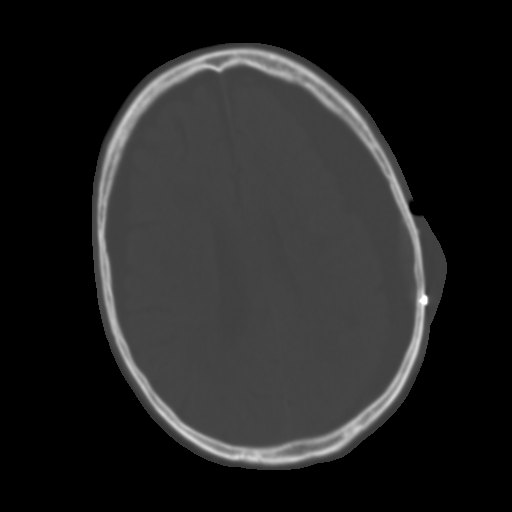
[im 22/32  brain]
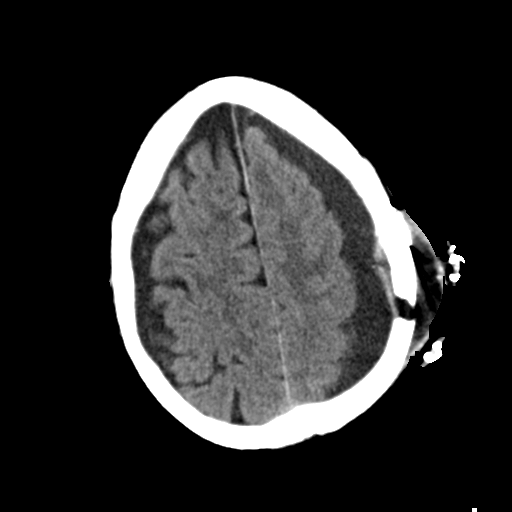
[im 25/32  brain]
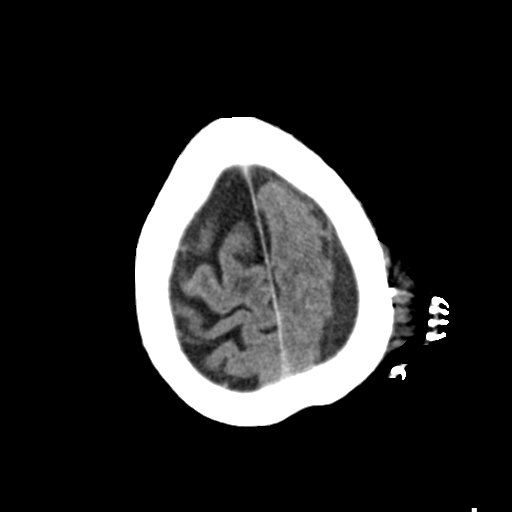
[im 29/32  brain]
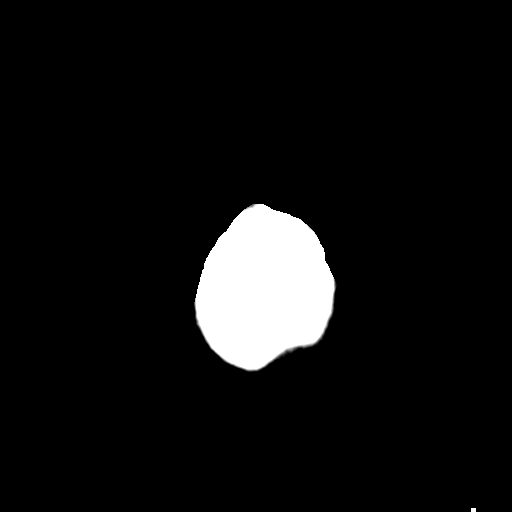

[Series 5: head 3.0 mpr cor · coronal · 0.31mm/px · 3 of 72 slices shown]
[im 24/72  brain]
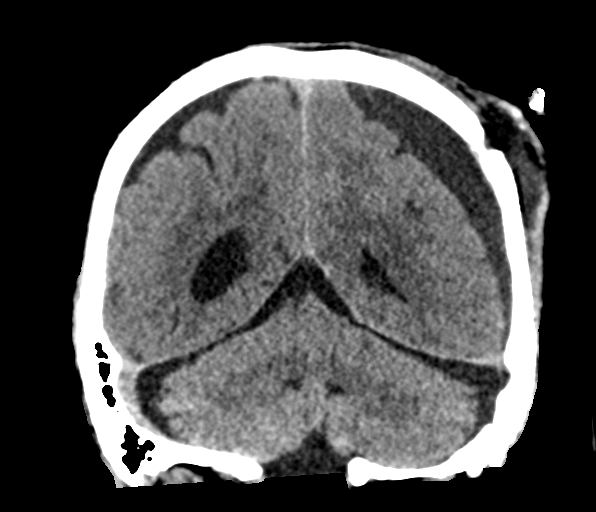
[im 32/72  brain]
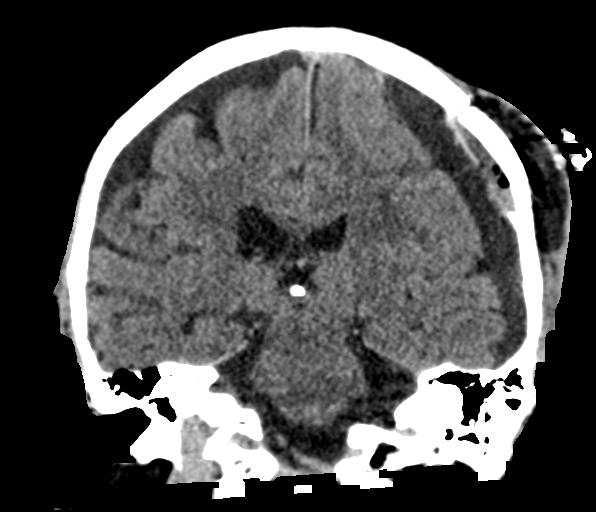
[im 40/72  brain]
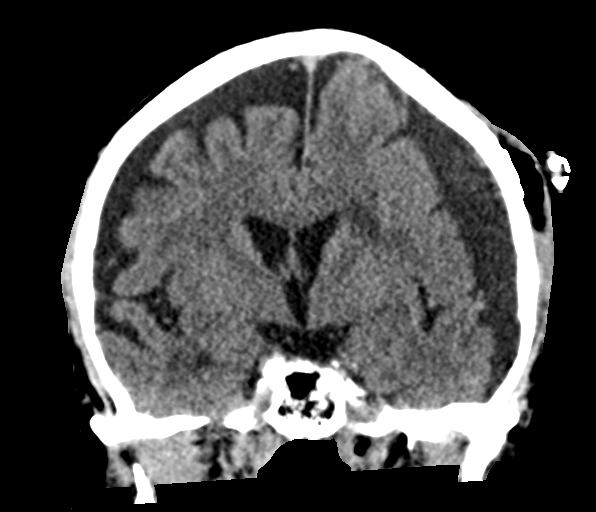

[Series 6: head 3.0 mpr sag · sagittal · 0.31mm/px · 3 of 61 slices shown]
[im 21/61  brain]
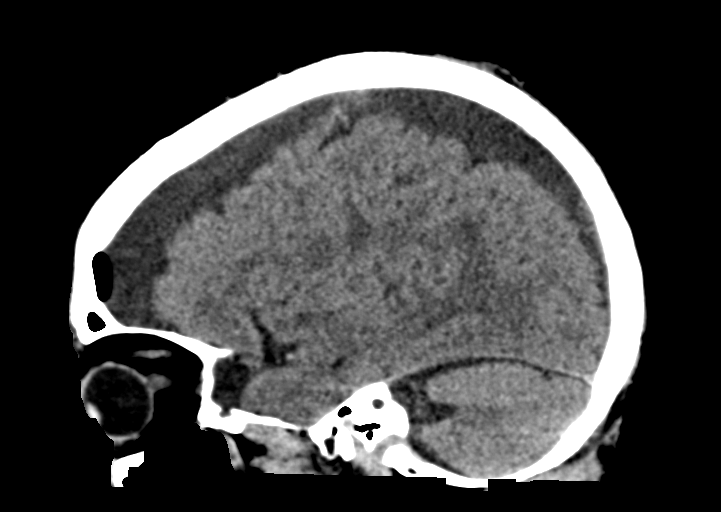
[im 31/61  brain]
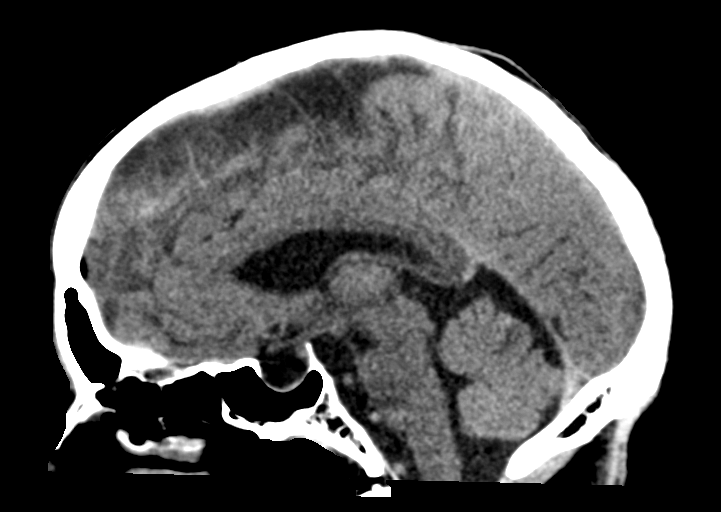
[im 41/61  brain]
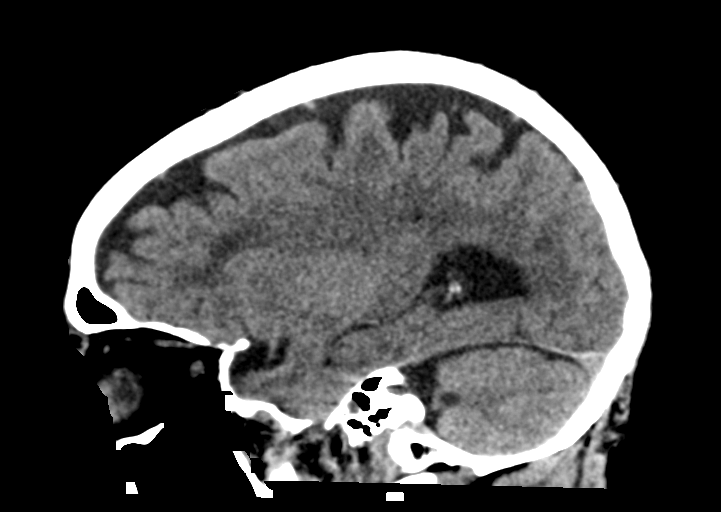

[14 of 47 positions shown; findings below may reference images not displayed]

FINDINGS: Brain: Similar or slightly decrease in the size of the extra-axial
collection over the left parietal lobe deep to the parietal
craniotomy. Low attenuating bilateral extra-axial fluid collections
appear relatively similar in size to the prior CT measuring
approximately 7 mm in thickness over the left frontal lobe. There is
associated mild mass effect and effacement of the sulci similar to
prior CT. Interval decrease in the size of the pneumocephalus over
the left frontal lobe. Near complete resolution of the hemorrhage in
the left cerebellum and vermis. No significant midline shift
(measured at the level of foramen REIKO series 3, image 14). There
is age-related atrophy and chronic microvascular ischemic changes.
No new intracranial hemorrhage identified. There is an empty sella.

Vascular: No hyperdense vessel or unexpected calcification.

Skull: Left parietal craniotomy. No acute calvarial pathology.

Sinuses/Orbits: No acute finding.

Other: Skin staples and soft tissue swelling with probable seroma
and pocket of air over the left parietal calvarium
IMPRESSION: 1. Similar or minimally decreased size of the extra-axial fluid
along the left parietal craniotomy. Interval resolution of the
previously seen hemorrhage in the cerebellar vermis. No new
hemorrhage.
2. No significant interval change in the size of the low attenuating
bilateral extra-axial fluid with similar degree of mass effect on
the left cerebral hemisphere. No significant midline shift.
3. Age-related atrophy and chronic microvascular ischemic changes.

## 2019-07-11 MED ORDER — LIDOCAINE HCL URETHRAL/MUCOSAL 2 % EX GEL
CUTANEOUS | Status: DC | PRN
  Administered 2019-07-16: 5 via TOPICAL
  Filled 2019-07-11 (×2): qty 5

## 2019-07-11 MED ORDER — ENSURE ENLIVE PO LIQD
237.0000 mL | Freq: Three times a day (TID) | ORAL | Status: DC
  Administered 2019-07-12 – 2019-07-23 (×18): 237 mL via ORAL

## 2019-07-11 MED ORDER — NICOTINE 14 MG/24HR TD PT24
14.0000 mg | MEDICATED_PATCH | Freq: Every day | TRANSDERMAL | Status: DC
  Administered 2019-07-11 – 2019-07-22 (×12): 14 mg via TRANSDERMAL
  Filled 2019-07-11 (×13): qty 1

## 2019-07-11 MED ORDER — SULFAMETHOXAZOLE-TRIMETHOPRIM 800-160 MG PO TABS
1.0000 | ORAL_TABLET | Freq: Two times a day (BID) | ORAL | Status: DC
  Administered 2019-07-12 – 2019-07-13 (×4): 1 via ORAL
  Filled 2019-07-11 (×5): qty 1

## 2019-07-11 MED ORDER — TAMSULOSIN HCL 0.4 MG PO CAPS
0.4000 mg | ORAL_CAPSULE | Freq: Every day | ORAL | Status: DC
  Administered 2019-07-11 – 2019-07-14 (×4): 0.4 mg via ORAL
  Filled 2019-07-11 (×4): qty 1

## 2019-07-11 MED ORDER — HYDROCODONE-ACETAMINOPHEN 5-325 MG PO TABS
1.0000 | ORAL_TABLET | ORAL | Status: DC | PRN
  Administered 2019-07-11 – 2019-07-21 (×21): 1 via ORAL
  Filled 2019-07-11 (×21): qty 1

## 2019-07-11 NOTE — Progress Notes (Signed)
[facility] Individual Statement of Services  Patient Name:  Lonnie [name]  Date:  07/11/2019  Welcome to the [facility].  Our goal is to provide you with an individualized program based on your diagnosis and situation, designed to meet your specific needs.  With this comprehensive rehabilitation program, you will be expected to participate in at least 3 hours of rehabilitation therapies Monday-Friday, with modified therapy programming on the weekends.  Your rehabilitation program will include the following services:  Physical Therapy (PT), Occupational Therapy (OT), Speech Therapy (ST), 24 hour per day rehabilitation nursing, Neuropsychology, Case Management (Social Worker), Rehabilitation Medicine, Nutrition Services and Pharmacy Services  Weekly team conferences will be held on Wednesdays to discuss your progress.  Your Social Worker will talk with you frequently to get your input and to update you on team discussions.  Team conferences with you and your family in attendance may also be held.  Expected length of stay:  5 to 7 days  Overall anticipated outcome:  Supervision/minimal assistance  Depending on your progress and recovery, your program may change. Your Social Worker will coordinate services and will keep you informed of any changes. Your Social Worker's name and contact numbers are listed  below.  The following services may also be recommended but are not provided by the [facility] will be made to provide these services after discharge if needed.  Arrangements include referral to agencies that provide these services.  Your insurance has been verified to be:  [employer] Your primary doctor is:  Dr. Kathlene November  Pertinent information will be shared with your doctor and your insurance company.  Social  Worker:  Alfonse Alpers, LCSW  [phone] or ([phone]  Information discussed with and copy given to patient by: Trey Sailors, 07/11/2019, 3:11 PM
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
## 2019-07-11 NOTE — Progress Notes (Signed)
Bed alarm going off. RN went to pt room, found pt sitting on side of bed at bottom. Underwear and bed pads were wet. Pt stated he needed to urinate so RN offered to go to bathroom. Pt stating "this is my house and I dont have to comply with these rules" RN oriented pt to place and time. Pt doesn't become oriented. Very agitated. Attempted to walk to bathroom but pt unable to follow commands d/t confusion/agitiation. Placed on bed and NT came in to assist in sliding pt up in bed. Bed alarm present and on lower setting for safety purposes. Pt refused lunch. Call bell in reach. Will cont to monitor.   Erie Noe, RN
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
## 2019-07-11 NOTE — Plan of Care (Signed)
  Problem: Consults Goal: RH GENERAL PATIENT EDUCATION Description: See Patient Education module for education specifics. Outcome: Progressing   Problem: RH BOWEL ELIMINATION Goal: RH STG MANAGE BOWEL WITH ASSISTANCE Description: STG Manage Bowel with min Assistance. Outcome: Progressing Goal: RH STG MANAGE BOWEL W/MEDICATION W/ASSISTANCE Description: STG Manage Bowel with Medication with mod I Assistance. Outcome: Progressing   Problem: RH PAIN MANAGEMENT Goal: RH STG PAIN MANAGED AT OR BELOW PT'S PAIN GOAL Description: Less than 4 on 0-10 scale Outcome: Not Progressing   Problem: RH KNOWLEDGE DEFICIT GENERAL Goal: RH STG INCREASE KNOWLEDGE OF SELF CARE AFTER HOSPITALIZATION Description: Pt able to demonstrate medication compliance and safety precautions prior to discharge with min assist Outcome: Not Progressing

## 2019-07-11 NOTE — Progress Notes (Signed)
Patient information reviewed and entered into eRehab System by Becky Quantavia Frith, PPS coordinator. Information including medical coding, function ability, and quality indicators will be reviewed and updated through discharge.   

## 2019-07-11 NOTE — Progress Notes (Signed)
[facility] PHYSICAL MEDICINE & REHABILITATION PROGRESS NOTE   Subjective/Complaints:    ROS- neg for CP SOB, N/V/D Objective:   Dg Shoulder Left  Result Date: 07/10/2019 CLINICAL DATA:  Left shoulder pain EXAM: LEFT SHOULDER - 2+ VIEW COMPARISON:  None. FINDINGS: There is no evidence of fracture or dislocation. There is no evidence of arthropathy or other focal bone abnormality. Soft tissues are unremarkable. IMPRESSION: Negative. Electronically Signed   By: Paulina FusiMark  Shogry M.D.   On: 07/10/2019 10:16   Recent Labs    07/09/19 0525  WBC 10.0  HGB 14.5  HCT 44.9  PLT 269   Recent Labs    07/09/19 0525  NA 137  K 4.0  CL 105  CO2 25  GLUCOSE 98  BUN 14  CREATININE 0.99  CALCIUM 9.0    Intake/Output Summary (Last 24 hours) at 07/11/2019 0744 Last data filed at 07/11/2019 0438 Gross per 24 hour  Intake 240 ml  Output 1150 ml  Net -910 ml     Physical Exam: Vital Signs Blood pressure (!) 163/50, pulse 78, temperature 98.7 F (37.1 C), temperature source Oral, resp. rate 17, height 6\' 2"  (1.88 m), weight 75.2 kg, SpO2 99 %.   General: No acute distress Mood and affect are appropriate Heart: Regular rate and rhythm no rubs murmurs or extra sounds Lungs: Clear to auscultation, breathing unlabored, no rales or wheezes Abdomen: Positive bowel sounds, soft nontender to palpation, nondistended Extremities: No clubbing, cyanosis, or edema Skin: No evidence of breakdown, Left scalp staples CDI , sucut fluid around the incision no eythema or tenderness Neurologic: Cranial nerves II through XII intact, motor strength is 5/5 in bilateral deltoid, bicep, tricep, grip, hip flexor, knee extensors, ankle dorsiflexor and plantar flexor Sensory exam normal sensation to light touch and proprioception in bilateral upper and lower extremities Mild confusion, thought he was going home and  Coming back for OP therapy  Musculoskeletal: + impingement Left shoulder neg O  briens   Assessment/Plan: 1. Functional deficits secondary to L SDH  which require 3+ hours per day of interdisciplinary therapy in a comprehensive inpatient rehab setting.  Physiatrist is providing close team supervision and 24 hour management of active medical problems listed below.  Physiatrist and rehab team continue to assess barriers to discharge/monitor patient progress toward functional and medical goals  Care Tool:  Bathing    Body parts bathed by patient: Right arm, Left arm, Abdomen, Chest, Front perineal area, Buttocks, Right upper leg, Left upper leg, Right lower leg, Left lower leg, Face         Bathing assist Assist Level: Supervision/Verbal cueing     Upper Body Dressing/Undressing Upper body dressing   What is the patient wearing?: Pull over shirt    Upper body assist Assist Level: Supervision/Verbal cueing    Lower Body Dressing/Undressing Lower body dressing      What is the patient wearing?: Pants     Lower body assist Assist for lower body dressing: Contact Guard/Touching assist     Toileting Toileting    Toileting assist Assist for toileting: Minimal Assistance - Patient > 75%     Transfers Chair/bed transfer  Transfers assist     Chair/bed transfer assist level: Minimal Assistance - Patient > 75% Chair/bed transfer assistive device: Geologist, engineeringWalker   Locomotion Ambulation   Ambulation assist      Assist level: Contact Guard/Touching assist Assistive device: Walker-rolling Max distance: 15'(limited by pain (headache))   Walk 10 feet activity   Assist  Assist level: Contact Guard/Touching assist Assistive device: Walker-rolling   Walk 50 feet activity   Assist Walk 50 feet with 2 turns activity did not occur: Safety/medical concerns(limited by pain (headache))         Walk 150 feet activity   Assist Walk 150 feet activity did not occur: Safety/medical concerns(limited by pain (headache))         Walk 10 feet on  uneven surface  activity   Assist Walk 10 feet on uneven surfaces activity did not occur: Safety/medical concerns(limited by pain (headache))         Wheelchair     Assist Will patient use wheelchair at discharge?: No   Wheelchair activity did not occur: N/A(Patient is ambulatory)         Wheelchair 50 feet with 2 turns activity    Assist    Wheelchair 50 feet with 2 turns activity did not occur: N/A(Patient is ambulatory)       Wheelchair 150 feet activity     Assist Wheelchair 150 feet activity did not occur: N/A(Patient is ambulatory)          Medical Problem List and Plan: 1.Functional deficits and right hemiparesissecondary to left fronto-parietal SDH CIR PT, OT, SLP evals   TBI, easily aggitated but responds to redirection, some lability as well, ask for neuropsych eval  2. Antithrombotics: -DVT/anticoagulation:Mechanical:Sequential compression devices, below kneeBilateral lower extremities -antiplatelet therapy:  3. Pain Management:tylenol prn.c/o left shoulder pain but exam is neg, check Xray , suspect impingement OT will see if some fxnl activity may aggravate  Increased HA pain, due for Norco has been taking 3-4 x per day, if no relief may need to re image 4. Mood:LCSW to follow for evaluation and support. -antipsychotic agents: N/A 5. Neuropsych: This patientiscapable of making decisions on hisown behalf. 6. Skin/Wound Care:Routine pressure relief measures.incision with sub cut fluid but no leakage, no erythema or tenderness 7. Fluids/Electrolytes/Nutrition:Monitor I/O. Check lytes in am.  8. HTN: Monitor BP tid--continueAvapro and metoprolol. 9. CAD s/p CABG:Continue Lipitor. 10. Bradycardia: --monitor for symptoms. Will set parameters on BB. Vitals:   07/10/19 1913 07/11/19 0439  BP: (!) 150/63 (!) 163/50  Pulse: 89 78  Resp: 18 17  Temp: 98.4 F (36.9 C) 98.7 F (37.1 C)   SpO2: 98% 75%  elevated systolic has reportedly been on another BP med in past but was allergic (ACE-I)11.Tobacco use: On nicotine patch. 12. Constipation: Has not had BM since last Sunday.Refusing laxative at this time--wants to continue stool softner only. -encourage ample liquids as well as fruits and veggies  LOS: 3 days A FACE TO FACE EVALUATION WAS PERFORMED  Charlett Blake 07/11/2019, 7:44 AM
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
## 2019-07-11 NOTE — Progress Notes (Signed)
Physical Therapy Session Note  Patient Details  Name: Lonnie [name] MRN: [id number] Date of Birth: 16-Sep-1951  Today's Date: 07/11/2019 PT Individual Time: 0915-1000 PT Individual Time Calculation (min): 45 min  and Today's Date: 07/11/2019 PT Missed Time: 30 Minutes Missed Time Reason: Pain(and nausea)  Short Term Goals: Week 1:  PT Short Term Goal 1 (Week 1): STG=LTG due to short ELOS  Skilled Therapeutic Interventions/Progress Updates:    Pt supine in bed upon PT arrival, pt reports "I am just not feeling that great." Pt reports nausea and back pain 7/10. Pt agreeable to try to get OOB and go to the bathroom. Pt transferred to sitting EOB with min assist and increased time. Pt seated EOB x 5 minutes for upright activity tolerance with supervision before getting up, continues to report not feeling well. Pt performs sit<>stand from edge of bed with RW and min assist, pt ambulated to the toilet with RW and min assist x 10 ft with cues for increased R step length, poor R foot clearance noted. When turning to sit on the toilet pt begins reaching for the wall instead of keeping hands on walker, cues for safety to keep hands on RW and back all the way up to toilet before sitting. Pt continent of bladder. Increased time sitting on toilet secondary to nausea, pt reports "I need a minute" before trying to get back up. Pt performed stand pivot to w/c from toilet with mod assist, continued report of nausea and back pain. Pt requesting to get back in bed. Pt transported in w/c back to the bed and performed stand pivot with min assist and use of bedrail. Sit>supine with min assist. Pt declines bed level exercises secondary to nausea and pain now 10/10 in low back. Therapist provided pillows and assisted with repositioning for pain relief. Pt missed 30 minutes of skilled therapy tx secondary to pain and nausea.   Therapy Documentation Precautions:  Precautions Precautions: Fall Restrictions Weight  Bearing Restrictions: No    Therapy/Group: Individual Therapy  Netta Corrigan, PT, DPT 07/11/2019, 9:53 AM

## 2019-07-11 NOTE — Progress Notes (Signed)
Occupational Therapy Session Note  Patient Details  Name: Lonnie [name] MRN: [id number] Date of Birth: 04-03-1951  Today's Date: 07/11/2019 OT Individual Time: 5643-3295 OT Individual Time Calculation (min): 12 min  63 minutes missed    Short Term Goals: Week 1:  OT Short Term Goal 1 (Week 1): STG= LTG d/t ELOS  Skilled Therapeutic Interventions/Progress Updates:    Pt greeted in bed. Per PA and RN, confusion and agitation have been ongoing today.  Encouraged pt to engage in bedlevel or OOB self care tasks. Pt adamant that he did not want to participate in therapy. Agitation increased when given increased encouragement and activity suggestions. To address Lt shoulder pain, provided him with MHP and positioned limb for comfort on pillow. RN made aware of his request for pain medication. Time missed due to pt refusal.    Therapy Documentation Precautions:  Precautions Precautions: Fall Restrictions Weight Bearing Restrictions: No Vital Signs: Therapy Vitals Temp: 99.3 F (37.4 C) Temp Source: Oral Pulse Rate: 68 Resp: 18 BP: 137/66 Patient Position (if appropriate): Lying Oxygen Therapy SpO2: 98 % O2 Device: Room Air Pain: Pain Assessment Pain Scale: Faces Faces Pain Scale: Hurts even more Pain Type: Acute pain Pain Location: Shoulder Pain Orientation: Left;Posterior Pain Descriptors / Indicators: Aching Patients Stated Pain Goal: 3 Pain Intervention(s): Medication (See eMAR);Repositioned ADL:       Therapy/Group: Individual Therapy  Lewis Grivas A [name] 07/11/2019, 4:09 PM

## 2019-07-11 NOTE — Progress Notes (Signed)
Speech Language Pathology Daily Session Note  Patient Details  Name: Lonnie [name] MRN: [id number] Date of Birth: 08-25-1951  Today's Date: 07/11/2019 SLP Individual Time: 0805-0830 SLP Individual Time Calculation (min): 25 min  Short Term Goals: Week 1: SLP Short Term Goal 1 (Week 1): STGs=LTGs due to short length of stay  Skilled Therapeutic Interventions: Skilled ST services focused on cognitive skills. Pt expressed head pain and that nurse had provided medication, but was willing to participate in [place] session. Pt recalled consuming 3 medications this am, however he consumed 5 medications. SLP facilitated recall of current medication, provided medication list, pt required total A for recall name/function/times per day. Pt was unable to recall medication piror to admission. SLP noted possible semantic paraphrasing, with inappropriate word choices requiring x2 clarification of message with no awareness. Pt missed 30 minutes of services due pain and request for rest.Pt was left in room with call bell within reach and bed alarm set. ST recommends to continue skilled ST services.      Pain Pain Assessment Pain Scale: 0-10 Pain Score: 8  Pain Type: Acute pain Pain Location: Head Pain Orientation: Left Pain Descriptors / Indicators: Aching Pain Frequency: Constant Pain Onset: On-going Patients Stated Pain Goal: 4 Pain Intervention(s): Medication (See eMAR)  Therapy/Group: Individual Therapy  Renatta Shrieves  [facility] 07/11/2019, 8:01 AM

## 2019-07-12 ENCOUNTER — Inpatient Hospital Stay (HOSPITAL_COMMUNITY): Payer: Medicare HMO

## 2019-07-12 ENCOUNTER — Encounter (HOSPITAL_COMMUNITY): Payer: Medicare HMO | Admitting: Psychology

## 2019-07-12 ENCOUNTER — Inpatient Hospital Stay (HOSPITAL_COMMUNITY): Payer: Medicare HMO | Admitting: Speech Pathology

## 2019-07-12 LAB — BASIC METABOLIC PANEL
Anion gap: 10 (ref 5–15)
BUN: 17 mg/dL (ref 8–23)
CO2: 22 mmol/L (ref 22–32)
Calcium: 9.1 mg/dL (ref 8.9–10.3)
Chloride: 100 mmol/L (ref 98–111)
Creatinine, Ser: 1.1 mg/dL (ref 0.61–1.24)
GFR calc Af Amer: >60 mL/min (ref >60)
GFR calc non Af Amer: >60 mL/min (ref >60)
Glucose, Bld: 128 mg/dL — ABNORMAL HIGH (ref 70–99)
Potassium: 4.6 mmol/L (ref 3.5–5.1)
Sodium: 132 mmol/L — ABNORMAL LOW (ref 135–145)

## 2019-07-12 NOTE — Progress Notes (Signed)
Physical Therapy Session Note  Patient Details  Name: Lonnie [name] MRN: [id number] Date of Birth: 1951-10-03  Today's Date: 07/12/2019 PT Individual Time: 1000-1055 PT Individual Time Calculation (min): 55 min   Short Term Goals: Week 1:  PT Short Term Goal 1 (Week 1): STG=LTG due to short ELOS  Skilled Therapeutic Interventions/Progress Updates:    Pt supine in bed upon PT arrival, agreeable to therapy tx and reports back pain 4/10. Pt transferred to sitting EOB, increased time secondary to pain. Pt seated EOB working on upright activity and sitting balance while eating banana and drinking sprite, supervision. Pt agreeable to get OOB today, stand pivot to the w/c with min assist with cues for techniques. Pt transported to the gym in w/c. Pt performed stand pivot to the mat with RW and min assist, decreased R foot clearance. Pt worked on R foot clearance and balance in order to perform toe taps on aerobic step, x 2 trials with UE support on RW and min assist. Pt worked on standing balance with RW and CGA while tossing horseshoes, x 2 trials with cues for upright posture. Pt ambulated x 20 ft with RW and min-mod assist, pt with poor R foot clearance and poor attention to R LE requiring up to mod assist for safety with turning to sit in w/c. Pt ambulated another 20 ft with RW and min assist, added ace wrap for increased DF assist, cues for increased R step length. Pt transported back to room, stand pivot to bed with min assist and transferred to supine with supervision. Pt left supine with needs in reach and bed alarm set.   Therapy Documentation Precautions:  Precautions Precautions: Fall Restrictions Weight Bearing Restrictions: No    Therapy/Group: Individual Therapy  Netta Corrigan, PT, DPT 07/12/2019, 7:55 AM
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
## 2019-07-12 NOTE — Progress Notes (Signed)
Patient remains extremely agitated and angry, family called and spoke with daughter Lonnie [name] with  updated,reassuance provided

## 2019-07-12 NOTE — Progress Notes (Signed)
verbally agitated and irritable upon rounding, verbalize he " doesn't want to be here and hate this place it feels like a prison, I have talked about this most of the day today and will not speak on this again".. I refused to be degraded and answer to any questions at this time.

## 2019-07-12 NOTE — Plan of Care (Signed)
  Problem: Consults Goal: RH GENERAL PATIENT EDUCATION Description: See Patient Education module for education specifics. Outcome: Progressing   Problem: RH BOWEL ELIMINATION Goal: RH STG MANAGE BOWEL WITH ASSISTANCE Description: STG Manage Bowel with min Assistance. Outcome: Progressing Goal: RH STG MANAGE BOWEL W/MEDICATION W/ASSISTANCE Description: STG Manage Bowel with Medication with mod I Assistance. Outcome: Progressing   Problem: RH PAIN MANAGEMENT Goal: RH STG PAIN MANAGED AT OR BELOW PT'S PAIN GOAL Description: Less than 4 on 0-10 scale Outcome: Progressing   Problem: RH KNOWLEDGE DEFICIT GENERAL Goal: RH STG INCREASE KNOWLEDGE OF SELF CARE AFTER HOSPITALIZATION Description: Pt able to demonstrate medication compliance and safety precautions prior to discharge with min assist Outcome: Progressing   Problem: RH BLADDER ELIMINATION Goal: RH STG MANAGE BLADDER WITH ASSISTANCE Description: STG Manage Bladder With min Assistance Outcome: Progressing Goal: RH STG MANAGE BLADDER WITH MEDICATION WITH ASSISTANCE Description: STG Manage Bladder With Medication With mod I Assistance. Outcome: Progressing Goal: RH STG MANAGE BLADDER WITH EQUIPMENT WITH ASSISTANCE Description: STG Manage Bladder With Equipment With mod Assistance Outcome: Progressing

## 2019-07-12 NOTE — Progress Notes (Signed)
Occupational Therapy Session Note  Patient Details  Name: HARLAN ERVINE MRN: [id number] Date of Birth: 1950-12-23  Today's Date: 07/12/2019 OT Individual Time: 1400-1425 OT Individual Time Calculation (min): 25 min    Short Term Goals: Week 1:  OT Short Term Goal 1 (Week 1): STG= LTG d/t ELOS  Skilled Therapeutic Interventions/Progress Updates:    Pt asleep upon arrival but easily aroused. Pt's lunch tray was untouched and OTA initially encouraged pt to eat lunch since he had declined breakfast earlier in the day.  Pt continues to decline.  Focus on recall of morning's activities.  Pt able to recall SLP session but unable to recall PT session.  Focus on bed mobility and sit<>stand with mod A for sit<>stand.  Pt speech continues to be slow and deliberate. Responses to questions seems delayed. Pt remained in bed with all needs within reach and bed alarm activated.   Therapy Documentation Precautions:  Precautions Precautions: Fall Restrictions Weight Bearing Restrictions: No    Pain: Pt c/o L upper UE pain/discomfort (unrated); repositioned  Therapy/Group: Individual Therapy  Leroy Libman 07/12/2019, 2:34 PM

## 2019-07-12 NOTE — Progress Notes (Signed)
Speech Language Pathology Daily Session Note  Patient Details  Name: Lonnie [name] MRN: [id number] Date of Birth: 11-02-1951  Today's Date: 07/12/2019 SLP Individual Time: 1255-1335 SLP Individual Time Calculation (min): 40 min  Short Term Goals: Week 1: SLP Short Term Goal 1 (Week 1): STGs=LTGs due to short length of stay  Skilled Therapeutic Interventions: Skilled treatment session focused on cognitive goals. Upon arrival, patient was awake and supine in bed. Patient had not eaten his lunch meal yet but continued to decline despite encouragement. Patient initially verbose but was able to be redirected to a cognitive task. SLP facilitated session by providing extra time for basic problem solving during a basic money management task. Patient was unable to count a specific amount of bills and change and was perseverative on counting the same coins. When SLP attempted to cue the patient, he verbalized, "let me try my way." Despite multiple attempts, patient not open to cues from clinician. Patient also perseverative on pain in his lower back and required total A for problem solving (sitting upright in bed for support rather than EOB). Patient left supine in bed with alarm on and all needs within reach. Continue with current plan of care.      Pain Pain Assessment Pain Score: 4  Pain in back RN aware, patient repositioned   Therapy/Group: Individual Therapy  Miche Loughridge 07/12/2019, 2:25 PM

## 2019-07-12 NOTE — Progress Notes (Signed)
[facility] PHYSICAL MEDICINE & REHABILITATION PROGRESS NOTE   Subjective/Complaints:  CT report reviewed , pt alert and participating in ADL program this am   ROS- neg for CP SOB, N/V/D Objective:   Ct Head Wo Contrast  Result Date: 07/11/2019 CLINICAL DATA:  68 year old male with subdural hematoma follow-up. EXAM: CT HEAD WITHOUT CONTRAST TECHNIQUE: Contiguous axial images were obtained from the base of the skull through the vertex without intravenous contrast. COMPARISON:  Most recent head CT dated 07/06/2019 FINDINGS: Brain: Similar or slightly decrease in the size of the extra-axial collection over the left parietal lobe deep to the parietal craniotomy. Low attenuating bilateral extra-axial fluid collections appear relatively similar in size to the prior CT measuring approximately 7 mm in thickness over the left frontal lobe. There is associated mild mass effect and effacement of the sulci similar to prior CT. Interval decrease in the size of the pneumocephalus over the left frontal lobe. Near complete resolution of the hemorrhage in the left cerebellum and vermis. No significant midline shift (measured at the level of foramen Monro series 3, image 14). There is age-related atrophy and chronic microvascular ischemic changes. No new intracranial hemorrhage identified. There is an empty sella. Vascular: No hyperdense vessel or unexpected calcification. Skull: Left parietal craniotomy. No acute calvarial pathology. Sinuses/Orbits: No acute finding. Other: Skin staples and soft tissue swelling with probable seroma and pocket of air over the left parietal calvarium IMPRESSION: 1. Similar or minimally decreased size of the extra-axial fluid along the left parietal craniotomy. Interval resolution of the previously seen hemorrhage in the cerebellar vermis. No new hemorrhage. 2. No significant interval change in the size of the low attenuating bilateral extra-axial fluid with similar degree of mass effect on  the left cerebral hemisphere. No significant midline shift. 3. Age-related atrophy and chronic microvascular ischemic changes. Electronically Signed   By: Elgie CollardArash  Radparvar M.D.   On: 07/11/2019 21:01   Dg Shoulder Left  Result Date: 07/10/2019 CLINICAL DATA:  Left shoulder pain EXAM: LEFT SHOULDER - 2+ VIEW COMPARISON:  None. FINDINGS: There is no evidence of fracture or dislocation. There is no evidence of arthropathy or other focal bone abnormality. Soft tissues are unremarkable. IMPRESSION: Negative. Electronically Signed   By: Paulina FusiMark  Shogry M.D.   On: 07/10/2019 10:16   No results for input(s): WBC, HGB, HCT, PLT in the last 72 hours. No results for input(s): NA, K, CL, CO2, GLUCOSE, BUN, CREATININE, CALCIUM in the last 72 hours.  Intake/Output Summary (Last 24 hours) at 07/12/2019 0753 Last data filed at 07/12/2019 0447 Gross per 24 hour  Intake 240 ml  Output 2700 ml  Net -2460 ml     Physical Exam: Vital Signs Blood pressure 119/64, pulse 65, temperature 99.2 F (37.3 C), temperature source Oral, resp. rate 16, height 6\' 2"  (1.88 m), weight 75.2 kg, SpO2 100 %.   General: No acute distress Mood and affect are appropriate Heart: Regular rate and rhythm no rubs murmurs or extra sounds Lungs: Clear to auscultation, breathing unlabored, no rales or wheezes Abdomen: Positive bowel sounds, soft nontender to palpation, nondistended Extremities: No clubbing, cyanosis, or edema Skin: No evidence of breakdown, Left scalp staples CDI , sucut fluid around the incision no eythema or tenderness Neurologic: Cranial nerves II through XII intact, motor strength is 5/5 in bilateral deltoid, bicep, tricep, grip, hip flexor, knee extensors, ankle dorsiflexor and plantar flexor Sensory exam normal sensation to light touch and proprioception in bilateral upper and lower extremities Mild confusion, thought  he was going home and  Coming back for OP therapy  Musculoskeletal: + impingement Left shoulder  neg O briens, atrophy of left supra and infraspinatus fossa   Assessment/Plan: 1. Functional deficits secondary to L SDH  which require 3+ hours per day of interdisciplinary therapy in a comprehensive inpatient rehab setting.  Physiatrist is providing close team supervision and 24 hour management of active medical problems listed below.  Physiatrist and rehab team continue to assess barriers to discharge/monitor patient progress toward functional and medical goals  Care Tool:  Bathing    Body parts bathed by patient: Right arm, Left arm, Abdomen, Chest, Front perineal area, Buttocks, Right upper leg, Left upper leg, Right lower leg, Left lower leg, Face         Bathing assist Assist Level: Supervision/Verbal cueing     Upper Body Dressing/Undressing Upper body dressing   What is the patient wearing?: Pull over shirt    Upper body assist Assist Level: Supervision/Verbal cueing    Lower Body Dressing/Undressing Lower body dressing      What is the patient wearing?: Pants     Lower body assist Assist for lower body dressing: Contact Guard/Touching assist     Toileting Toileting    Toileting assist Assist for toileting: Moderate Assistance - Patient 50 - 74%     Transfers Chair/bed transfer  Transfers assist     Chair/bed transfer assist level: Moderate Assistance - Patient 50 - 74% Chair/bed transfer assistive device: ArboriculturistWalker   Locomotion Ambulation   Ambulation assist      Assist level: Minimal Assistance - Patient > 75% Assistive device: Walker-rolling Max distance: 10 ft   Walk 10 feet activity   Assist     Assist level: Minimal Assistance - Patient > 75% Assistive device: Walker-rolling   Walk 50 feet activity   Assist Walk 50 feet with 2 turns activity did not occur: Safety/medical concerns(limited by pain (headache))         Walk 150 feet activity   Assist Walk 150 feet activity did not occur: Safety/medical concerns(limited by  pain (headache))         Walk 10 feet on uneven surface  activity   Assist Walk 10 feet on uneven surfaces activity did not occur: Safety/medical concerns(limited by pain (headache))         Wheelchair     Assist Will patient use wheelchair at discharge?: No   Wheelchair activity did not occur: N/A(Patient is ambulatory)         Wheelchair 50 feet with 2 turns activity    Assist    Wheelchair 50 feet with 2 turns activity did not occur: N/A(Patient is ambulatory)       Wheelchair 150 feet activity     Assist Wheelchair 150 feet activity did not occur: N/A(Patient is ambulatory)          Medical Problem List and Plan: 1.Functional deficits and right hemiparesissecondary to left fronto-parietal SDH CIR PT, OT, SLP evals   TBI,agistation alternating with somnolence, SDH unchanged but cerebellar vermis blood resolved  2. Antithrombotics: -DVT/anticoagulation:Mechanical:Sequential compression devices, below kneeBilateral lower extremities -antiplatelet therapy:  3. Pain Management:tylenol prn.c/o left shoulder pain but exam is neg, check Xray , suspect impingement, with periscapular muscle atrophy suspect chronic RCT vs suprascpular nerve injury (less common) OT will see if some fxnl activity may aggravate  Increased HA pain, due for Norco has been taking 3-4 x per day, if no relief may need to re image 4. Mood:LCSW  to follow for evaluation and support. -antipsychotic agents: N/A 5. Neuropsych: This patientiscapable of making decisions on hisown behalf. 6. Skin/Wound Care:Routine pressure relief measures.incision with sub cut fluid but no leakage, no erythema or tenderness 7. Fluids/Electrolytes/Nutrition:Monitor I/O. Check lytes in am.  8. HTN: Monitor BP tid--continueAvapro and metoprolol. 9. CAD s/p CABG:Continue Lipitor. 10. Bradycardia: --monitor for symptoms. Will set parameters on  BB. Vitals:   07/11/19 1933 07/12/19 0615  BP: 129/77 119/64  Pulse: 69 65  Resp: 16 16  Temp: 99.3 F (37.4 C) 99.2 F (37.3 C)  SpO2: 100% 263%  elevated systolic has reportedly been on another BP med in past but was allergic (ACE-I)11.Tobacco use: On nicotine patch. 12. Constipation: Has not had BM since last Sunday.Refusing laxative at this time--wants to continue stool softner only.  13.  UTI await cx, on Bactrim pending sensitivities LOS: 4 days A FACE TO FACE EVALUATION WAS PERFORMED  Charlett Blake 07/12/2019, 7:53 AM

## 2019-07-12 NOTE — Progress Notes (Signed)
Occupational Therapy Session Note  Patient Details  Name: Lonnie [name] MRN: [id number] Date of Birth: 30-Mar-1951  Today's Date: 07/12/2019 OT Individual Time: 234 007 3018 OT Individual Time Calculation (min): 71 min    Short Term Goals: Week 1:  OT Short Term Goal 1 (Week 1): STG= LTG d/t ELOS  Skilled Therapeutic Interventions/Progress Updates:    Pt asleep in bed but easily aroused.  Pt oriented to place and situation.  Pt agreeable to participating in therapy and washing up and dressing.  Pt declined shower this morning.  Pt labile and tearful throughout session.  Pt required max A for stand pivot transfer to w/c.  Pt required increased assistance with bathing/dressing tasks this morning (max A for LB dressing). Pt required more than a reasonable amount of time to complete all tasks this morning and engaged in extended discussions regarding current situation.  Pt required min verbal cues to redirect to task. Pt returned to bed and remained in bed with all needs within reach and bed alarm activated.   Therapy Documentation Precautions:  Precautions Precautions: Fall Restrictions Weight Bearing Restrictions: No Pain:  Pt c/o increased back pain with movement (unrated); emotional support and repositioning   Therapy/Group: Individual Therapy  Leroy Libman 07/12/2019, 8:17 AM

## 2019-07-13 ENCOUNTER — Inpatient Hospital Stay (HOSPITAL_COMMUNITY): Payer: Medicare HMO | Admitting: Speech Pathology

## 2019-07-13 ENCOUNTER — Inpatient Hospital Stay (HOSPITAL_COMMUNITY): Payer: Medicare HMO

## 2019-07-13 ENCOUNTER — Inpatient Hospital Stay (HOSPITAL_COMMUNITY): Payer: Medicare HMO | Admitting: Physical Therapy

## 2019-07-13 LAB — URINE CULTURE: Culture: >=100000 — AB

## 2019-07-13 MED ORDER — CIPROFLOXACIN HCL 500 MG PO TABS
500.0000 mg | ORAL_TABLET | Freq: Two times a day (BID) | ORAL | Status: DC
  Administered 2019-07-13 – 2019-07-14 (×3): 500 mg via ORAL
  Filled 2019-07-13 (×3): qty 1

## 2019-07-13 MED ORDER — LORATADINE 10 MG PO TABS
10.0000 mg | ORAL_TABLET | Freq: Every day | ORAL | Status: DC
  Administered 2019-07-13 – 2019-07-23 (×11): 10 mg via ORAL
  Filled 2019-07-13 (×11): qty 1

## 2019-07-13 MED ORDER — FLUTICASONE PROPIONATE 50 MCG/ACT NA SUSP
2.0000 | Freq: Every day | NASAL | Status: DC
  Administered 2019-07-13 – 2019-07-23 (×11): 2 via NASAL
  Filled 2019-07-13: qty 16

## 2019-07-13 NOTE — Progress Notes (Signed)
Speech Language Pathology Daily Session Note  Patient Details  Name: Lonnie [name] MRN: [id number] Date of Birth: November 04, 1951  Today's Date: 07/13/2019 SLP Individual Time: 1400-1455 SLP Individual Time Calculation (min): 55 min  Short Term Goals: Week 1: SLP Short Term Goal 1 (Week 1): STGs=LTGs due to short length of stay  Skilled Therapeutic Interventions: Skilled treatment session focused on cognitive goals. SLP facilitated session by providing Min A verbal cues for functional problem solving during a basic money management task. Patient also recalled the functions of his current medications with overall Min A verbal cues with increased attention to tasks this session. Patient also demonstrated increased verbal expression and a more appropriate rate compared to previous sessions. Patient left upright in bed with all needs within reach and alarm on. Continue with current plan of care.      Pain No/Denies Pain   Therapy/Group: Individual Therapy  Lonnie [name] 07/13/2019, 3:50 PM

## 2019-07-13 NOTE — Progress Notes (Signed)
Physical Therapy Session Note  Patient Details  Name: Lonnie [name] MRN: [id number] Date of Birth: 1951-12-13  Today's Date: 07/13/2019 PT Individual Time: 1120-1205 PT Individual Time Calculation (min): 45 min   Short Term Goals: Week 1:  PT Short Term Goal 1 (Week 1): STG=LTG due to short ELOS  Skilled Therapeutic Interventions/Progress Updates:     Patient in bed upon PT arrival. Patient alert and agreeable to PT session.  Therapeutic Activity: Bed Mobility: Patient performed supine to/from sit with supervision for safety in a flat bed without use of bed rails. P Transfers: Patient performed sit to/from stand x2, stand pivot x2, and a car transfer x1 using a RW with min A-CGA. Provided verbal cues for hand placement on RW and reaching back to sit.  Gait Training:  Patient ambulated 85 feet using RW with CGA-min A. Ambulated with decreased step length and height on the R with intermittent toe drag, worse with fatigue, increased forward trunk lean, and downward head gaze. Provided verbal cues for increased step height, erect posture, and looking ahead. Patient went up/down 12  6" steps with CGA with reciprocal gait pattern ascending and min A and step-to gait pattern descending using B rails.   Wheelchair Mobility:  Patient was transported in the w/c with total A during session for time management and energy conservation.   Patient in bed at end of session with breaks locked, bed alarm set, and all needs within reach. Patient expressed concerns about fluid in his inner ear, stating he felt the fluid moving, RN made aware.    Therapy Documentation Precautions:  Precautions Precautions: Fall Restrictions Weight Bearing Restrictions: No  Pain: Patient reported 6-7/10 headache at end of session, RN made aware. PT provided repositioning and distraction for pain interventions.    Therapy/Group: Individual Therapy  Andree Golphin L Hudson Majkowski PT, DPT  07/13/2019, 2:07 PM

## 2019-07-13 NOTE — Progress Notes (Signed)
Occupational Therapy Session Note  Patient Details  Name: Lonnie [name] MRN: [id number] Date of Birth: [birth year]  Today's Date: 07/13/2019 OT Individual Time: 0800-0900 OT Individual Time Calculation (min): 60 min    Short Term Goals: Week 1:  OT Short Term Goal 1 (Week 1): STG= LTG d/t ELOS  Skilled Therapeutic Interventions/Progress Updates:    OT intervention with focus on bed mobility, sit<>stand, standing balance, functional amb with RW, BADL training, task initiation, sequencing, activity tolerance, and safety awareness to increase independence with BADLs. Pt amb with RW to bathroom for shower-CGA for amb and shower. Pt returned to room and completed dressing tasks with sti<>stand from w/c with CGA.  Pt requires more than a reasonable amount of time to complete tasks with min verbal cues to redirect and reinitiate tasks. Pt was easily directed during this session.  Pt c/o occasional low back pain but did not inhibit pt's ability to complete tasks. Pt remained in bed with all needs within reach and bed alarm activated.   Therapy Documentation Precautions:  Precautions Precautions: Fall Restrictions Weight Bearing Restrictions: No Pain:  Pt c/o low back pain (unrated); shower and repositioned   Therapy/Group: Individual Therapy  Leroy Libman 07/13/2019, 9:14 AM

## 2019-07-13 NOTE — Progress Notes (Signed)
Physical Therapy Session Note  Patient Details  Name: Lonnie [name] MRN: [id number] Date of Birth: Aug 07, 1951  Today's Date: 07/13/2019 PT Individual Time: 1005-1030 PT Individual Time Calculation (min): 25 min   Short Term Goals: Week 1:  PT Short Term Goal 1 (Week 1): STG=LTG due to short ELOS  Skilled Therapeutic Interventions/Progress Updates:  Pt received in bed & agreeable to tx, reporting "every day is getting better". Pt transfers to sitting EOB with supervision, bed flat, no rails. Pt completes stand pivot to w/c on L with min assist and requests to don socks & shoes. Pt requires cuing to bring BLE vs leaning over to don socks and does so with extra time & supervision and cuing to attend to task as pt internally distracted with conversation. Pt is able to don shoes but requires cuing to tie laces. Pt reports he can hear fluid move in his head when he leans forward to manage footwear & RN made aware. Pt requires extra time & cuing to tie R laces with pt becoming irritated with therapist when offering to assist him. Pt transfers sit<>stand with min assist with max cuing for hand placement when stand>sit but poor return demo. Pt ambulates 75 ft with kyphotic posture and min assist with therapist providing slight tactile cuing for increased weight shifting L and cuing for increased hip/knee flexion & heel strike RLE with pt able to return demo with ongoing cuing, as otherwise pt with impaired foot clearance, step length, and heel strike RLE. Pt requests to return to bed & does so with min assist stand pivot, doff shoes with therapist assist for time management, and sit>supine with supervision with bed flat, no rails. Pt left in bed with alarm set & call bell in reach.  Therapy Documentation Precautions:  Precautions Precautions: Fall Restrictions Weight Bearing Restrictions: No  Pain: "One that has always been resident is my back." Pt rates it 8/10 with rest breaks provided  PRN.   Therapy/Group: Individual Therapy  Waunita Schooner 07/13/2019, 10:30 AM

## 2019-07-13 NOTE — Progress Notes (Addendum)
[facility] PHYSICAL MEDICINE & REHABILITATION PROGRESS NOTE   Subjective/Complaints:  Pt remains confused, states a psych Dr told him nothing was wrong with his spine.the patient has not been complaining of spine pain but of shoulder pain.  THe pt states that this reminds him of [place], no c/o of spine problems, seen by Neuropsych yesterday .We discussed his CT head showing resolution of cerebellar bleed.    No HA or shoulder pain c/os today  Discussed with neuropsychology, according to daughters patient was renovating a house and painted the whole house pink and had disagreements with multiple contractors.  ROS- neg for CP SOB, N/V/D Objective:   Ct Head Wo Contrast  Result Date: 07/11/2019 CLINICAL DATA:  68 year old male with subdural hematoma follow-up. EXAM: CT HEAD WITHOUT CONTRAST TECHNIQUE: Contiguous axial images were obtained from the base of the skull through the vertex without intravenous contrast. COMPARISON:  Most recent head CT dated 07/06/2019 FINDINGS: Brain: Similar or slightly decrease in the size of the extra-axial collection over the left parietal lobe deep to the parietal craniotomy. Low attenuating bilateral extra-axial fluid collections appear relatively similar in size to the prior CT measuring approximately 7 mm in thickness over the left frontal lobe. There is associated mild mass effect and effacement of the sulci similar to prior CT. Interval decrease in the size of the pneumocephalus over the left frontal lobe. Near complete resolution of the hemorrhage in the left cerebellum and vermis. No significant midline shift (measured at the level of foramen Monro series 3, image 14). There is age-related atrophy and chronic microvascular ischemic changes. No new intracranial hemorrhage identified. There is an empty sella. Vascular: No hyperdense vessel or unexpected calcification. Skull: Left parietal craniotomy. No acute calvarial pathology. Sinuses/Orbits: No acute finding.  Other: Skin staples and soft tissue swelling with probable seroma and pocket of air over the left parietal calvarium IMPRESSION: 1. Similar or minimally decreased size of the extra-axial fluid along the left parietal craniotomy. Interval resolution of the previously seen hemorrhage in the cerebellar vermis. No new hemorrhage. 2. No significant interval change in the size of the low attenuating bilateral extra-axial fluid with similar degree of mass effect on the left cerebral hemisphere. No significant midline shift. 3. Age-related atrophy and chronic microvascular ischemic changes. Electronically Signed   By: Anner Crete M.D.   On: 07/11/2019 21:01   No results for input(s): WBC, HGB, HCT, PLT in the last 72 hours. Recent Labs    07/12/19 0927  NA 132*  K 4.6  CL 100  CO2 22  GLUCOSE 128*  BUN 17  CREATININE 1.10  CALCIUM 9.1    Intake/Output Summary (Last 24 hours) at 07/13/2019 0718 Last data filed at 07/13/2019 0645 Gross per 24 hour  Intake 462 ml  Output 1780 ml  Net -1318 ml     Physical Exam: Vital Signs Blood pressure 128/63, pulse (!) 58, temperature 98.2 F (36.8 C), temperature source Oral, resp. rate 18, height 6\' 2"  (1.88 m), weight 75.2 kg, SpO2 100 %.   General: No acute distress Mood and affect are appropriate Heart: Regular rate and rhythm no rubs murmurs or extra sounds Lungs: Clear to auscultation, breathing unlabored, no rales or wheezes Abdomen: Positive bowel sounds, soft nontender to palpation, nondistended Extremities: No clubbing, cyanosis, or edema Skin: No evidence of breakdown, Left scalp staples CDI , sucut fluid around the incision no eythema or tenderness Neurologic: Cranial nerves II through XII intact, motor strength is 5/5 in bilateral deltoid, bicep,  tricep, grip, hip flexor, knee extensors, ankle dorsiflexor and plantar flexor Sensory exam normal sensation to light touch and proprioception in bilateral upper and lower extremities Mild  confusion, thought he was going home and  Coming back for OP therapy  Musculoskeletal: + impingement Left shoulder neg O briens, atrophy of left supra and infraspinatus fossa   Assessment/Plan: 1. Functional deficits secondary to L SDH  which require 3+ hours per day of interdisciplinary therapy in a comprehensive inpatient rehab setting.  Physiatrist is providing close team supervision and 24 hour management of active medical problems listed below.  Physiatrist and rehab team continue to assess barriers to discharge/monitor patient progress toward functional and medical goals  Care Tool:  Bathing    Body parts bathed by patient: Right arm, Left arm, Chest, Abdomen, Front perineal area, Buttocks, Right upper leg   Body parts bathed by helper: Right lower leg, Left lower leg     Bathing assist Assist Level: Minimal Assistance - Patient > 75%     Upper Body Dressing/Undressing Upper body dressing   What is the patient wearing?: Pull over shirt    Upper body assist Assist Level: Moderate Assistance - Patient 50 - 74%    Lower Body Dressing/Undressing Lower body dressing      What is the patient wearing?: Underwear/pull up, Pants     Lower body assist Assist for lower body dressing: Moderate Assistance - Patient 50 - 74%     Toileting Toileting    Toileting assist Assist for toileting: Moderate Assistance - Patient 50 - 74%     Transfers Chair/bed transfer  Transfers assist     Chair/bed transfer assist level: Minimal Assistance - Patient > 75% Chair/bed transfer assistive device: Geologist, engineeringWalker   Locomotion Ambulation   Ambulation assist      Assist level: Moderate Assistance - Patient 50 - 74% Assistive device: Walker-rolling Max distance: 20 ft   Walk 10 feet activity   Assist     Assist level: Moderate Assistance - Patient - 50 - 74% Assistive device: Walker-rolling   Walk 50 feet activity   Assist Walk 50 feet with 2 turns activity did not occur:  Safety/medical concerns(limited by pain (headache))         Walk 150 feet activity   Assist Walk 150 feet activity did not occur: Safety/medical concerns(limited by pain (headache))         Walk 10 feet on uneven surface  activity   Assist Walk 10 feet on uneven surfaces activity did not occur: Safety/medical concerns(limited by pain (headache))         Wheelchair     Assist Will patient use wheelchair at discharge?: No   Wheelchair activity did not occur: N/A(Patient is ambulatory)         Wheelchair 50 feet with 2 turns activity    Assist    Wheelchair 50 feet with 2 turns activity did not occur: N/A(Patient is ambulatory)       Wheelchair 150 feet activity     Assist Wheelchair 150 feet activity did not occur: N/A(Patient is ambulatory)          Medical Problem List and Plan: 1.Functional deficits and right hemiparesissecondary to left fronto-parietal SDH CIR PT, OT, SLP evals   TBI,agistation alternating with somnolence, SDH unchanged but cerebellar vermis blood resolved   2. Antithrombotics: -DVT/anticoagulation:Mechanical:Sequential compression devices, below kneeBilateral lower extremities -antiplatelet therapy:  3. Pain Management:tylenol prn.c/o left shoulder pain but exam is neg, check Xray , suspect impingement,  with periscapular muscle atrophy suspect chronic RCT vs suprascpular nerve injury (less common) OT will see if some fxnl activity may aggravate  Increased HA pain, due for Norco has been taking 3-4 x per day, if no relief may need to re image 4. Mood:LCSW to follow for evaluation and support. -antipsychotic agents: N/A 5. Neuropsych: This patientis notcapable of making decisions on hisown behalf.  The patient had likely early onset frontotemporal dementia according to neuropsych which has been exacerbated by the TBI and now with UTI 6. Skin/Wound Care:Routine pressure  relief measures.incision with sub cut fluid but no leakage, no erythema or tenderness 7. Fluids/Electrolytes/Nutrition:Monitor I/O. Check lytes in am.  8. HTN: Monitor BP tid--continueAvapro and metoprolol. 9. CAD s/p CABG:Continue Lipitor. 10. Bradycardia: --monitor for symptoms. Will set parameters on BB. Vitals:   07/12/19 1954 07/13/19 0615  BP: 117/61 128/63  Pulse: 61 (!) 58  Resp: 15 18  Temp: 99.5 F (37.5 C) 98.2 F (36.8 C)  SpO2: 99% 100%  elevated systolic has reportedly been on another BP med in past but was allergic (ACE-I)11.Tobacco use: On nicotine patch. 12. Constipation: Has not had BM since last Sunday.Refusing laxative at this time--wants to continue stool softner only.  13.  UTI await cx,100K GNR  on Bactrim pending sensitivities LOS: 5 days A FACE TO FACE EVALUATION WAS PERFORMED  Erick Colacendrew E Andron Marrazzo 07/13/2019, 7:18 AM

## 2019-07-13 NOTE — Plan of Care (Signed)
  Problem: RH Problem Solving Goal: LTG Patient will demonstrate problem solving for (SLP) Description: LTG:  Patient will demonstrate problem solving for basic/complex daily situations with cues  (SLP) Flowsheets (Taken 07/13/2019 0631) LTG: Patient will demonstrate problem solving for (SLP): (downgraded 7/22) Basic daily situations Note: Downgraded 7/22   Problem: RH Memory Goal: LTG Patient will use memory compensatory aids to (SLP) Description: LTG:  Patient will use memory compensatory aids to recall biographical/new, daily complex information with cues (SLP) Flowsheets (Taken 07/13/2019 [phone]) LTG: Patient will use memory compensatory aids to (SLP): (downgraded 7/22) Moderate Assistance - Patient 50 - 74% Note: Downgraded 7/22   Problem: RH Awareness Goal: LTG: Patient will demonstrate awareness during functional activites type of (SLP) Description: LTG: Patient will demonstrate awareness during functional activites type of (SLP) Flowsheets (Taken 07/13/2019 0631) LTG: Patient will demonstrate awareness during cognitive/linguistic activities with assistance of (SLP): (downgraded 7/22) Moderate Assistance - Patient 50 - 74% Note: Downgraded 7/22   Problem: RH Attention Goal: LTG Patient will demonstrate this level of attention during functional activites (SLP) Description: LTG:  Patient will will demonstrate this level of attention during functional activites (SLP) 07/13/2019 0633 by Buzzy Han, [place] (Taken 07/13/2019 [phone]) Patient will demonstrate during cognitive/linguistic activities the attention type of: Selective LTG: Patient will demonstrate this level of attention during cognitive/linguistic activities with assistance of (SLP): (downgraded 7/22) Minimal Assistance - Patient > 75% Note: Downgraded 7/22 07/13/2019 6440 by Buzzy Han, CCC-SLP Flowsheets (Taken 07/13/2019 [phone]) LTG: Patient will demonstrate this level of attention during  cognitive/linguistic activities with assistance of (SLP): Minimal Assistance - Patient > 75% 07/13/2019 0631 by Buzzy Han, CCC-SLP Flowsheets (Taken 07/13/2019 [phone]) Patient will demonstrate during cognitive/linguistic activities the attention type of: Selective LTG: Patient will demonstrate this level of attention during cognitive/linguistic activities with assistance of (SLP): (downgraded 7/22) -- Note: Downgraded 7/22

## 2019-07-14 ENCOUNTER — Inpatient Hospital Stay (HOSPITAL_COMMUNITY): Payer: Medicare HMO

## 2019-07-14 ENCOUNTER — Inpatient Hospital Stay (HOSPITAL_COMMUNITY): Payer: Medicare HMO | Admitting: Speech Pathology

## 2019-07-14 ENCOUNTER — Inpatient Hospital Stay (HOSPITAL_COMMUNITY): Payer: Medicare HMO | Admitting: Occupational Therapy

## 2019-07-14 LAB — BASIC METABOLIC PANEL
Anion gap: 10 (ref 5–15)
BUN: 13 mg/dL (ref 8–23)
CO2: 22 mmol/L (ref 22–32)
Calcium: 9.1 mg/dL (ref 8.9–10.3)
Chloride: 103 mmol/L (ref 98–111)
Creatinine, Ser: 1.04 mg/dL (ref 0.61–1.24)
GFR calc Af Amer: >60 mL/min (ref >60)
GFR calc non Af Amer: >60 mL/min (ref >60)
Glucose, Bld: 97 mg/dL (ref 70–99)
Potassium: 4.5 mmol/L (ref 3.5–5.1)
Sodium: 135 mmol/L (ref 135–145)

## 2019-07-14 IMAGING — CR LUMBAR SPINE - 2-3 VIEW
3 series · 3 of 3 positions shown · non-contrast
Comparison: None.

CLINICAL DATA: Chronic low back pain, no known injury.

EXAM:
LUMBAR SPINE - 2-3 VIEW

[l-spine ap]
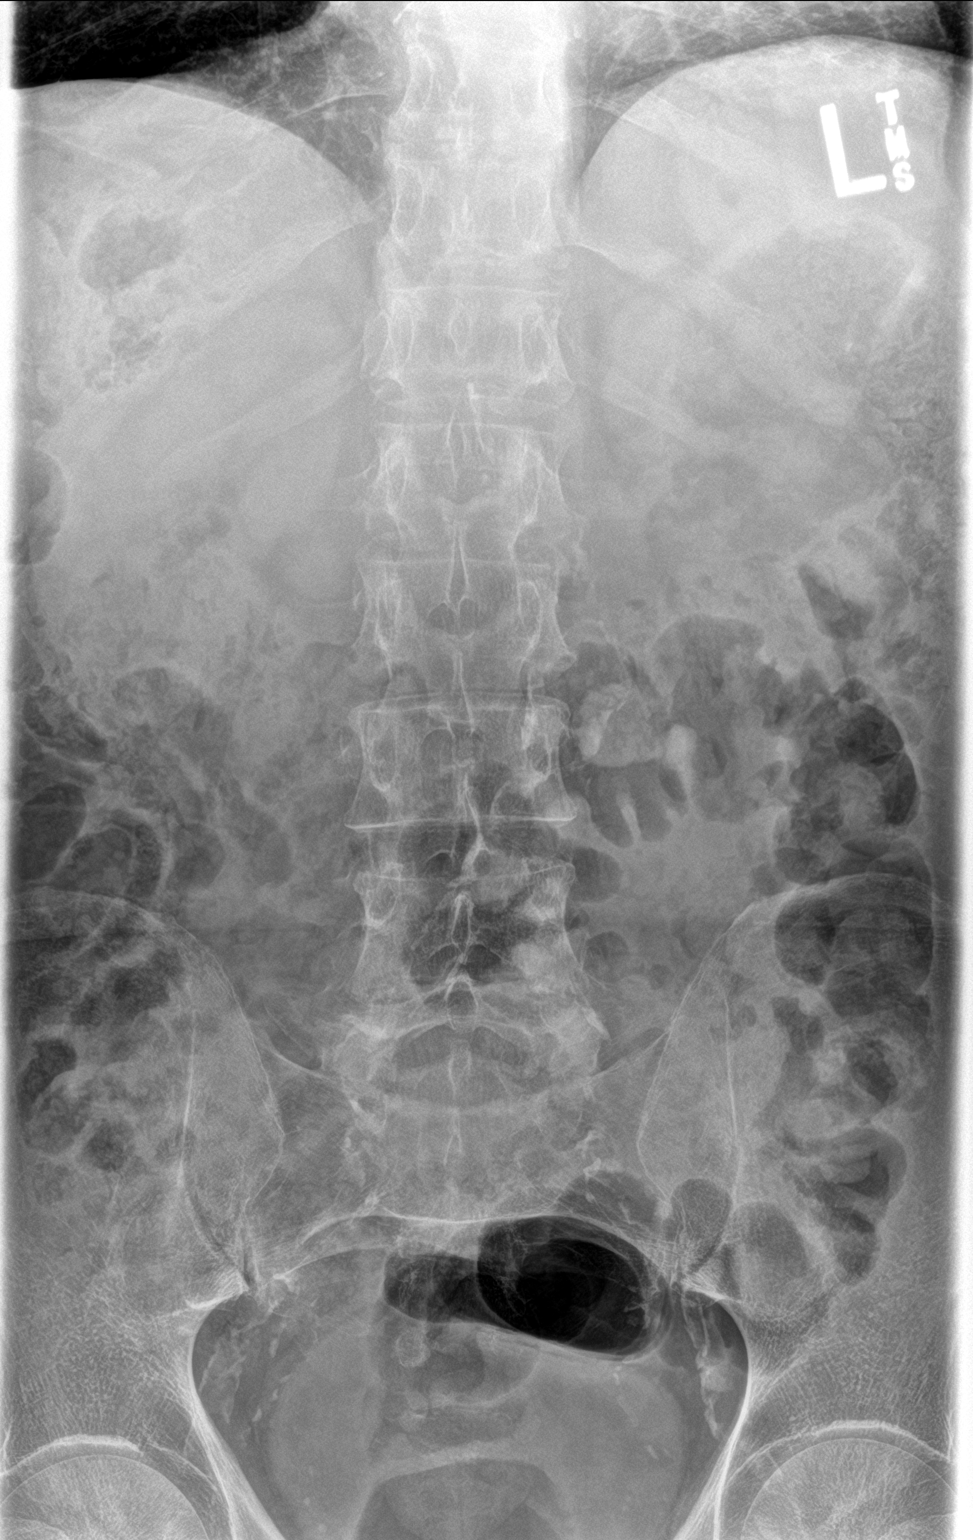

[l-spine lat]
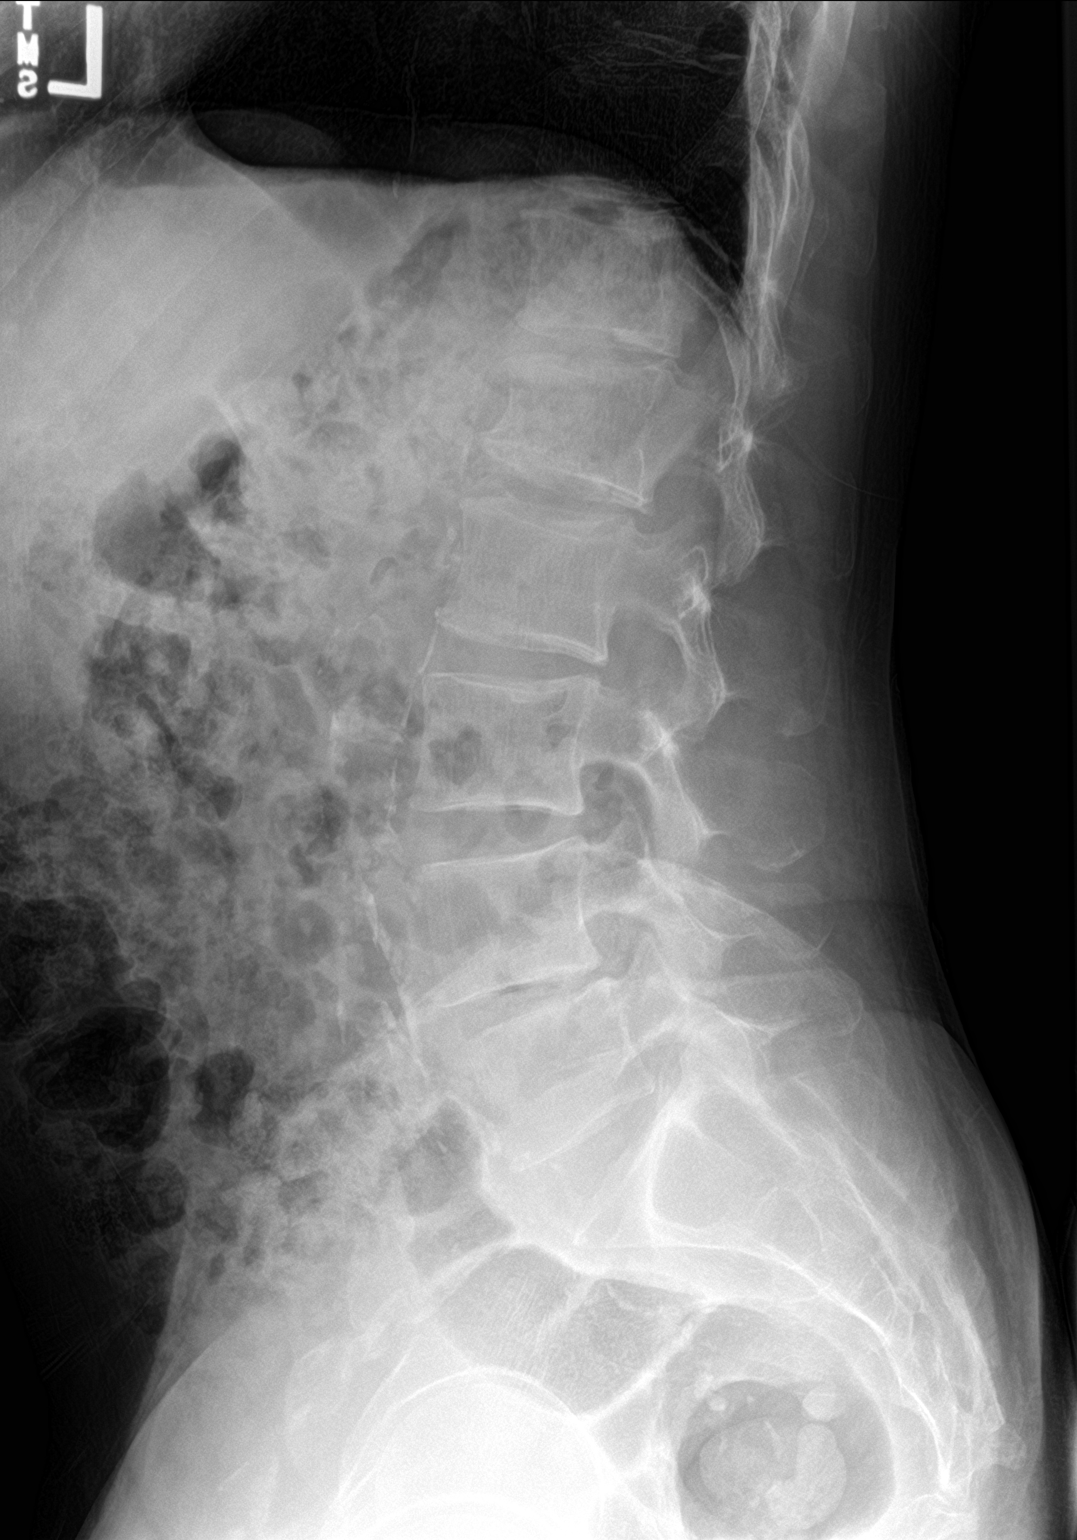

[l-spine spot]
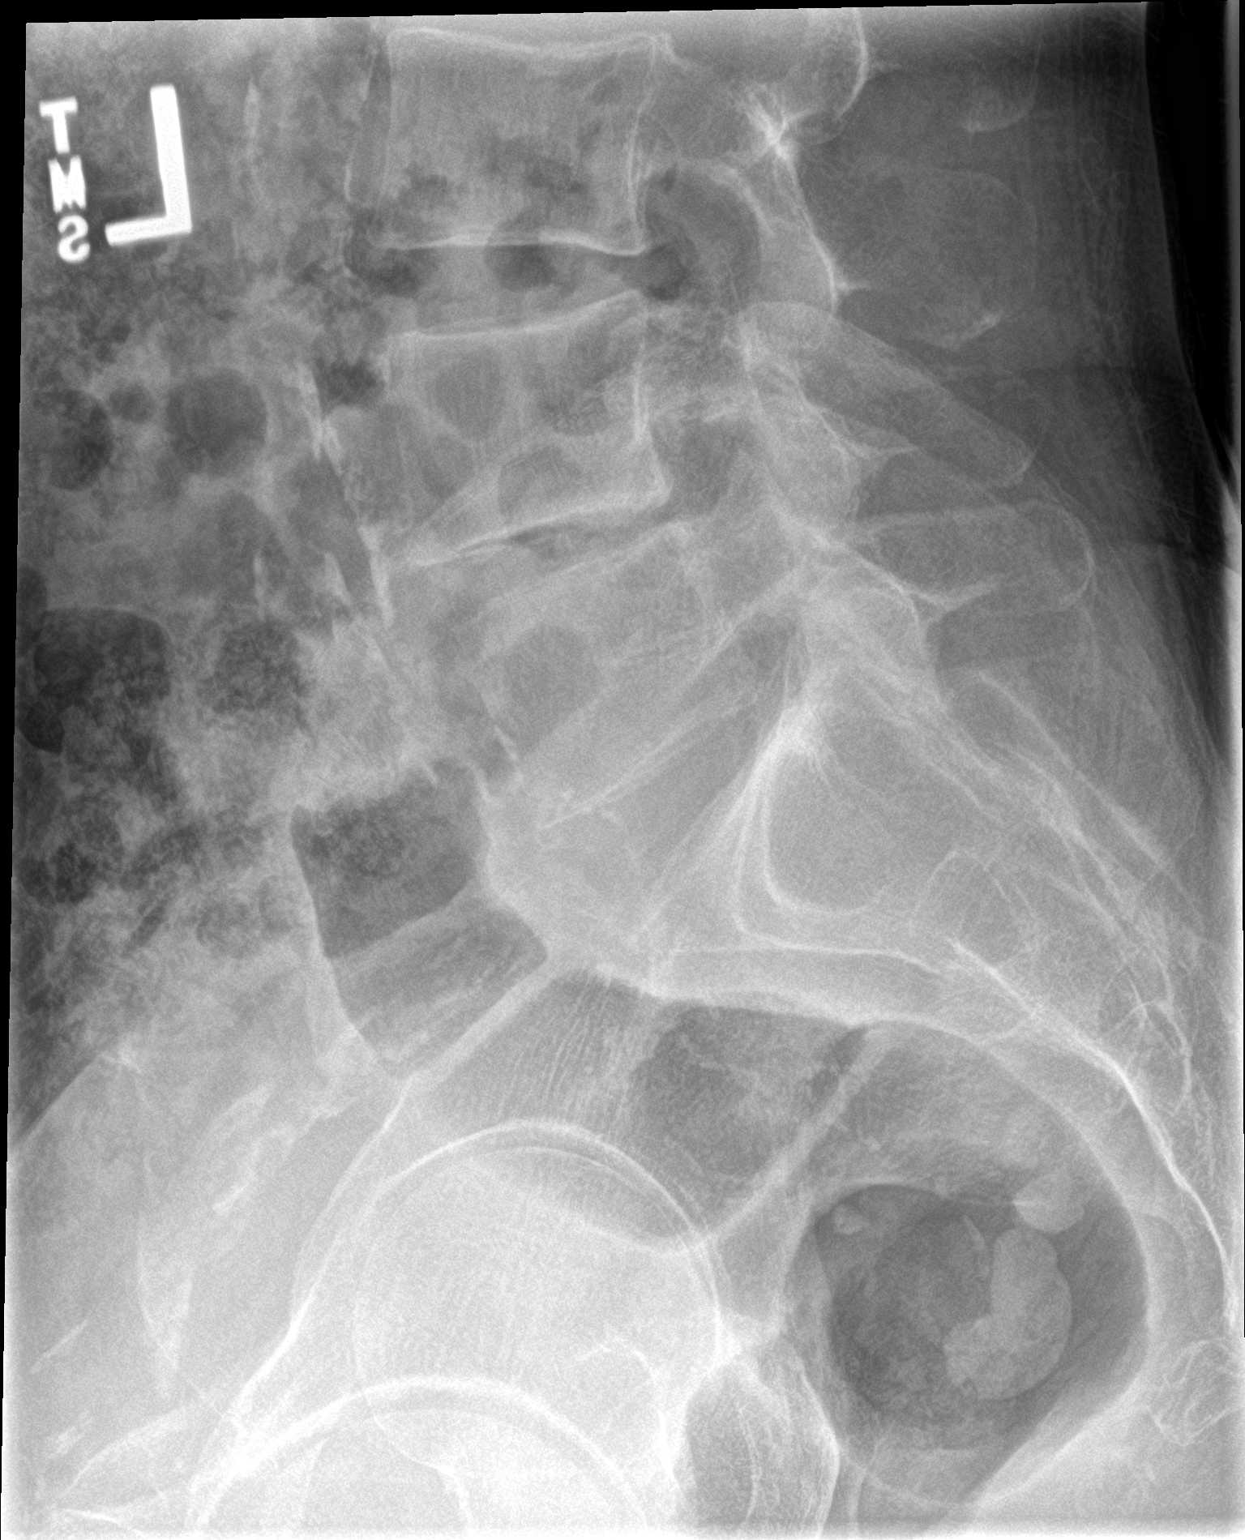

[3 of 3 positions shown; findings below may reference images not displayed]

FINDINGS: Mild disc space narrowing and vacuum disc at L4-5. Normal alignment.
Slight wedged appearance of L1. Aortic atherosclerosis. No visible
aneurysm.
IMPRESSION: Degenerative disc disease at L4-5.

Slight wedged appearance of L1 of unknown chronicity.

## 2019-07-14 MED ORDER — ACETAMINOPHEN 325 MG PO TABS
650.0000 mg | ORAL_TABLET | Freq: Three times a day (TID) | ORAL | Status: DC
  Administered 2019-07-14 – 2019-07-23 (×35): 650 mg via ORAL
  Filled 2019-07-14 (×36): qty 2

## 2019-07-14 MED ORDER — LIDOCAINE 5 % EX PTCH
1.0000 | MEDICATED_PATCH | Freq: Once | CUTANEOUS | Status: AC
  Administered 2019-07-14: 1 via TRANSDERMAL
  Filled 2019-07-14: qty 1

## 2019-07-14 MED ORDER — GABAPENTIN 100 MG PO CAPS
100.0000 mg | ORAL_CAPSULE | Freq: Two times a day (BID) | ORAL | Status: DC
  Administered 2019-07-14 – 2019-07-23 (×19): 100 mg via ORAL
  Filled 2019-07-14 (×19): qty 1

## 2019-07-14 MED ORDER — LIDOCAINE 5 % EX PTCH
1.0000 | MEDICATED_PATCH | CUTANEOUS | Status: DC
  Administered 2019-07-15 – 2019-07-23 (×9): 1 via TRANSDERMAL
  Filled 2019-07-14 (×10): qty 1

## 2019-07-14 NOTE — Progress Notes (Signed)
Speech Language Pathology Weekly Progress and Session Note  Patient Details  Name: Lonnie [name] MRN: [id number] Date of Birth: Oct 26, 1951  Beginning of progress report period: July 08, 2019 End of progress report period: July 14, 2019  Today's Date: 07/14/2019 SLP Individual Time: 1400-1455 SLP Individual Time Calculation (min): 55 min  Short Term Goals: Week 1: SLP Short Term Goal 1 (Week 1): STGs=LTGs due to short length of stay    New Short Term Goals: Week 2: SLP Short Term Goal 1 (Week 2): Patient will demonstrate functional problem solving for basic and familiar tasks with Min A verbal cues. SLP Short Term Goal 2 (Week 2): Patient will recall daily information with use of memory compensatory strategies with Mod A verbal cues. SLP Short Term Goal 3 (Week 2): Patient will demosntrate selective attention to functional tasks in a mildly distracting enviornment for 30 minutes with Min verbal cues for redirection. SLP Short Term Goal 4 (Week 2): Patient will self-monitor and correct errors as well as verbosity during functional tasks with Mod A verbal cues.  Weekly Progress Updates: Patient has made functional but inconsistent gains this reporting period. At the beginning of the reporting period, patient was initially demonstrating increased confusion and a decline in cognitive functioning 2/2 a UTI. However, patient is now showing improvement and requires overall Min A verbal cues for functional problem solving and Mod A verbal cues for sustained attention, emergent awareness and recall of daily information. Patient education is ongoing. Patient's length of stay has been extended due to medical issues and patient would benefit from continued skilled SLP intervention to maximize his cognitive functioning and overall functional independence prior to discharge.      Intensity: Minumum of 1-2 x/day, 30 to 90 minutes Frequency: 3 to 5 out of 7 days Duration/Length of Stay:  07/23/19 Treatment/Interventions: Cognitive remediation/compensation;Internal/external aids;Cueing hierarchy;Environmental controls;Therapeutic Activities;Patient/family education;Functional tasks   Daily Session  Skilled Therapeutic Interventions: Skilled treatment session focused on cognitive goals. SLP facilitated session by providing Mod A verbal cues for problem solving and Max A verbal cues for alternating attention between conversation and organizing a BID pill box for ~45 minutes. Patient left supine in bed with alarm on and all needs within reach. Continue with current plan of care.      Pain No/Denies Pain   Therapy/Group: Individual Therapy  Deeksha Cotrell 07/14/2019, 6:47 AM

## 2019-07-14 NOTE — Plan of Care (Signed)
  Problem: RH Balance Goal: LTG Patient will maintain dynamic standing with ADLs (OT) Description: LTG:  Patient will maintain dynamic standing balance with assist during activities of daily living (OT)  Flowsheets (Taken 07/14/2019 0747) LTG: Pt will maintain dynamic standing balance during ADLs with: (downgraded JLS) Supervision/Verbal cueing Note: downgraded JLS   Problem: RH Bathing Goal: LTG Patient will bathe all body parts with assist levels (OT) Description: LTG: Patient will bathe all body parts with assist levels (OT) Flowsheets (Taken 07/14/2019 0747) LTG: Pt will perform bathing with assistance level/cueing: (downgraded JLS) Supervision/Verbal cueing Note: downgraded JLS   Problem: RH Dressing Goal: LTG Patient will perform lower body dressing w/assist (OT) Description: LTG: Patient will perform lower body dressing with assist, with/without cues in positioning using equipment (OT) Flowsheets (Taken 07/14/2019 0747) LTG: Pt will perform lower body dressing with assistance level of: (downgraded JLS) Supervision/Verbal cueing Note: downgraded JLS   Problem: RH Toileting Goal: LTG Patient will perform toileting task (3/3 steps) with assistance level (OT) Description: LTG: Patient will perform toileting task (3/3 steps) with assistance level (OT)  Flowsheets (Taken 07/14/2019 0747) LTG: Pt will perform toileting task (3/3 steps) with assistance level: (downgraded JLS) -- Note: downgraded JLS   Problem: RH Toilet Transfers Goal: LTG Patient will perform toilet transfers w/assist (OT) Description: LTG: Patient will perform toilet transfers with assist, with/without cues using equipment (OT) Flowsheets (Taken 07/14/2019 0747) LTG: Pt will perform toilet transfers with assistance level of: (downgraded JLS) -- Note: downgraded JLS   Problem: RH Tub/Shower Transfers Goal: LTG Patient will perform tub/shower transfers w/assist (OT) Description: LTG: Patient will perform tub/shower  transfers with assist, with/without cues using equipment (OT) Flowsheets (Taken 07/14/2019 0747) LTG: Pt will perform tub/shower stall transfers with assistance level of: (downgraded JLS) Supervision/Verbal cueing Note: downgraded JLS

## 2019-07-14 NOTE — Progress Notes (Signed)
Occupational Therapy Session Note  Patient Details  Name: Lonnie [name] MRN: [id number] Date of Birth: 1951/11/18  Today's Date: 07/14/2019 OT Individual Time: 1115-1200 OT Individual Time Calculation (min): 45 min    Short Term Goals: Week 1:  OT Short Term Goal 1 (Week 1): STG= LTG d/t ELOS OT Short Term Goal 1 - Progress (Week 1): Progressing toward goal Week 2:  OT Short Term Goal 1 (Week 2): STG=LTG due to ELOS  Skilled Therapeutic Interventions/Progress Updates:    1:1 Pt in bed when arrived. Pt came to EOB with supervision and donned shoes with extra time with setup. Pt ambulated with RW with contact guard with cues to clear right foot with the longer distance. In gym focused on functional problem solving, selective attention on task, sequencing, organization etc building a structure from picture and then locating specific things in the daily newspaper. Ambulated back to room with min A - the more fatigue the increased right foot drag. Left resting in bed as requested.   Therapy Documentation Precautions:  Precautions Precautions: Fall Restrictions Weight Bearing Restrictions: No Pain:  no c/o pain  Therapy/Group: Individual Therapy  Willeen [facility] 07/14/2019, 3:41 PM

## 2019-07-14 NOTE — Progress Notes (Signed)
Physical Therapy Session Note  Patient Details  Name: Lonnie [name] MRN: [id number] Date of Birth: January 04, 1951  Today's Date: 07/14/2019 PT Individual Time: 0915-1015 PT Individual Time Calculation (min): 60 min   Short Term Goals: Week 1:  PT Short Term Goal 1 (Week 1): STG=LTG due to short ELOS  Skilled Therapeutic Interventions/Progress Updates:     Patient in bed upon PT arrival. Patient alert and agreeable to PT session. Patient perseverated on low back pain today. PT discussed chronicity of back pain, he reports he has been going to a chiropractor 2x per week for "2 decades" for his back pain. Educated on discussing pain management with his PCP and benefits of physical activity and physical therapy for management of low back pain. Focused session on transfer technique and increased ambulatory distance for activity tolerance.  Therapeutic Activity: Bed Mobility: Patient performed supine to sit with supervision. Provided verbal cues for log rolling to manage low back pain with mobility. Patient threaded LEs through paper scrub pants and donned shoes with supervision sitting EOB. Transfers: Patient performed sit to/from stand x10 with supervision using a RW. Provided verbal cues for reaching back to sit for safety and controlled descent.  Gait Training:  Patient ambulated 150 feet x2 and 20 feet x1 using RW with supervision. Ambulated with decreased gait speed, decreased step length and foot clearance on R (improved from yesterday), and kyphotic posture. Provided verbal cues for erect posture and increased step length and height with R foot. PT adjusted the RW to a lower height for improved posture during ambulation.  Patient in recliner with w/c cushion in the seat and a pillow behind his back for comfort at end of session with breaks locked, seat belt alarm set, and all needs within reach.    Therapy Documentation Precautions:  Precautions Precautions: Fall Restrictions Weight  Bearing Restrictions: No Vital Signs: Therapy Vitals Pulse Rate: (!) 59 BP: (!) 126/58 Pain: Pain Assessment Pain Scale: 0-10 Pain Score: 8  Pain Location: Head Pain Descriptors / Indicators: Aching;Sharp Pain Frequency: Constant Pain Onset: On-going Pain Intervention(s): RN made aware;Repositioned;Distraction Multiple Pain Sites: Yes 2nd Pain Site Pain Score: 8 Pain Type: Chronic pain Pain Location: Back Pain Orientation: Proximal;Lower Pain Descriptors / Indicators: Aching;Sharp Pain Frequency: Constant Pain Onset: On-going Pain Intervention(s): RN made aware;Repositioned;Distraction    Therapy/Group: Individual Therapy  Law Corsino L Lizabeth Fellner PT, DPT  07/14/2019, 12:46 PM

## 2019-07-14 NOTE — Progress Notes (Signed)
Occupational Therapy Session Note  Patient Details  Name: Lonnie [name] MRN: [id number] Date of Birth: Mar 29, 1951  Today's Date: 07/14/2019 OT Individual Time: 0700-0800 OT Individual Time Calculation (min): 60 min  and Today's Date: 07/14/2019 OT Missed Time: 15 Minutes Missed Time Reason: X-Ray   Short Term Goals: Week 2:  OT Short Term Goal 1 (Week 2): STG=LTG due to ELOS  Skilled Therapeutic Interventions/Progress Updates:    Pt resting in bed upon arrival.  Pt stated he wanted to eat breakfast before washing and donning clothing.  Pt sat EOB to eat breakfast without assistance.  Pt amb with RW to sink and completed grooming and bathing tasks while standing at sink.  Pt sat in w/c to bathe feet. MD arrived and discussion ensued about chronic low back pain.  Pt continued with bathing and dressing tasks.  Pt requires more than a reasonable amount of time to complete tasks and requires min verbal cues for redirection to task. Transport arrived to take pt to x-ray and pt donned hospital gown in lieu of pants.  OT intervention with focus on activity tolerance, sit<>stand, standing balance, task initiation, attention to task, and safety awareness to increase independence with BADLs.  Pt remained in bed for transport to x-ray.   Therapy Documentation Precautions:  Precautions Precautions: Fall Restrictions Weight Bearing Restrictions: No General: General OT Amount of Missed Time: 15 Minutes Pain: Pt c/o ongoing low back pain; MD aware and pt taken to x-ray Therapy/Group: Individual Therapy  Leroy Libman 07/14/2019, 8:04 AM

## 2019-07-14 NOTE — Progress Notes (Addendum)
[facility] PHYSICAL MEDICINE & REHABILITATION PROGRESS NOTE   Subjective/Complaints:  Discussed Ucx, no flank pain but has chronic low back pain.    ROS- neg for CP SOB, N/V/D Objective:   No results found. No results for input(s): WBC, HGB, HCT, PLT in the last 72 hours. Recent Labs    07/12/19 0927  NA 132*  K 4.6  CL 100  CO2 22  GLUCOSE 128*  BUN 17  CREATININE 1.10  CALCIUM 9.1    Intake/Output Summary (Last 24 hours) at 07/14/2019 0733 Last data filed at 07/14/2019 6270 Gross per 24 hour  Intake 535 ml  Output 2300 ml  Net -1765 ml     Physical Exam: Vital Signs Blood pressure (!) 154/71, pulse (!) 54, temperature 98.4 F (36.9 C), temperature source Oral, resp. rate 18, height 6\' 2"  (1.88 m), weight 75.2 kg, SpO2 100 %.   General: No acute distress Mood and affect are appropriate Heart: Regular rate and rhythm no rubs murmurs or extra sounds Lungs: Clear to auscultation, breathing unlabored, no rales or wheezes Abdomen: Positive bowel sounds, soft nontender to palpation, nondistended Extremities: No clubbing, cyanosis, or edema Skin: No evidence of breakdown, Left scalp staples CDI , sucut fluid around the incision no eythema or tenderness Neurologic: Cranial nerves II through XII intact, motor strength is 5/5 in bilateral deltoid, bicep, tricep, grip, hip flexor, knee extensors, ankle dorsiflexor and plantar flexor Sensory exam normal sensation to light touch and proprioception in bilateral upper and lower extremities Mild confusion, thought he was going home and  Coming back for OP therapy  Musculoskeletal: + impingement Left shoulder neg O briens, atrophy of left supra and infraspinatus fossa   Assessment/Plan: 1. Functional deficits secondary to L SDH  which require 3+ hours per day of interdisciplinary therapy in a comprehensive inpatient rehab setting.  Physiatrist is providing close team supervision and 24 hour management of active medical problems  listed below.  Physiatrist and rehab team continue to assess barriers to discharge/monitor patient progress toward functional and medical goals  Care Tool:  Bathing    Body parts bathed by patient: Right arm, Left arm, Chest, Abdomen, Front perineal area, Buttocks, Right upper leg, Left upper leg, Right lower leg, Left lower leg, Face   Body parts bathed by helper: Right lower leg, Left lower leg     Bathing assist Assist Level: Contact Guard/Touching assist     Upper Body Dressing/Undressing Upper body dressing   What is the patient wearing?: Pull over shirt    Upper body assist Assist Level: Supervision/Verbal cueing    Lower Body Dressing/Undressing Lower body dressing      What is the patient wearing?: Underwear/pull up, Pants     Lower body assist Assist for lower body dressing: Contact Guard/Touching assist     Toileting Toileting    Toileting assist Assist for toileting: Moderate Assistance - Patient 50 - 74%     Transfers Chair/bed transfer  Transfers assist     Chair/bed transfer assist level: Minimal Assistance - Patient > 75% Chair/bed transfer assistive device: Programmer, multimedia   Ambulation assist      Assist level: Contact Guard/Touching assist Assistive device: Walker-rolling Max distance: 85'   Walk 10 feet activity   Assist     Assist level: Contact Guard/Touching assist Assistive device: Walker-rolling   Walk 50 feet activity   Assist Walk 50 feet with 2 turns activity did not occur: Safety/medical concerns(limited by pain (headache))  Assist level:  Contact Guard/Touching assist Assistive device: Walker-rolling    Walk 150 feet activity   Assist Walk 150 feet activity did not occur: Safety/medical concerns(limited by pain (headache))         Walk 10 feet on uneven surface  activity   Assist Walk 10 feet on uneven surfaces activity did not occur: Safety/medical concerns(limited by pain  (headache))         Wheelchair     Assist Will patient use wheelchair at discharge?: No   Wheelchair activity did not occur: N/A(Patient is ambulatory)         Wheelchair 50 feet with 2 turns activity    Assist    Wheelchair 50 feet with 2 turns activity did not occur: N/A(Patient is ambulatory)       Wheelchair 150 feet activity     Assist Wheelchair 150 feet activity did not occur: N/A(Patient is ambulatory)          Medical Problem List and Plan: 1.Functional deficits and right hemiparesissecondary to left fronto-parietal SDH CIR PT, OT, SLP evals   TBI,agistation alternating with somnolence, SDH unchanged but cerebellar vermis blood resolved   2. Antithrombotics: -DVT/anticoagulation:Mechanical:Sequential compression devices, below kneeBilateral lower extremities -antiplatelet therapy:  3. Pain Management:tylenol prn.c/o left shoulder pain but exam is neg, check Xray , suspect impingement, with periscapular muscle atrophy suspect chronic RCT vs suprascpular nerve injury (less common) OT will see if some fxnl activity may aggravate  Increased HA pain, due for Norco has been taking 3-4 x per day, if no relief may need to re image 4. Mood:LCSW to follow for evaluation and support. -antipsychotic agents: N/A 5. Neuropsych: This patientis notcapable of making decisions on hisown behalf.  The patient had likely early onset frontotemporal dementia according to neuropsych which has been exacerbated by the TBI and now with UTI 6. Skin/Wound Care:Routine pressure relief measures.incision with sub cut fluid but no leakage, no erythema or tenderness 7. Fluids/Electrolytes/Nutrition:Monitor I/O. Check lytes in am.  8. HTN: Monitor BP tid--continueAvapro and metoprolol. 9. CAD s/p CABG:Continue Lipitor. 10. Bradycardia: --monitor for symptoms. Will set parameters on BB. Vitals:   07/13/19 1935 07/14/19 0424   BP: 121/69 (!) 154/71  Pulse: 61 (!) 54  Resp: 18 18  Temp: 99.1 F (37.3 C) 98.4 F (36.9 C)  SpO2: 99% 100%  elevated systolic has reportedly been on another BP med in past but was allergic (ACE-I)11.Tobacco use: On nicotine patch. 12. Constipation: Has not had BM since last Sunday.Refusing laxative at this time--wants to continue stool softner only.  13.  UTI await cx,100K GNR  on Bactrim switch to cipro for Pseudomonas LOS: 6 days A FACE TO FACE EVALUATION WAS PERFORMED  Lonnie [name] 07/14/2019, 7:33 AM

## 2019-07-14 NOTE — Progress Notes (Signed)
Occupational Therapy Weekly Progress Note  Patient Details  Name: Lonnie [name] MRN: [id number] Date of Birth: 11-24-1951  Beginning of progress report period: July 09, 2019 End of progress report period: July 15, 2019  Pt progress has been inconsistent since admission and initial evaluation.  Pt initially required min A/supervision during evaluation but functional and cognitive status declined during the ensuing days.  Pt diagnosed with UTI and antibiotics initiated.  Pt has since improved with BADLS and cognitive functiona.  Pt currently requires min A for functional transfers/amb with RW and CGA for LB bathing/dressing tasks.  Pt requires more than a reasonable amount of time to complete tasks with min verbal cues for redirection.   Pt will require 24 hour supervision at discharge and LTG downgraded accordingly.  Patient continues to demonstrate the following deficits: muscle weakness and decr endurance, decreased cardiorespiratoy endurance, impaired timing and sequencing and decreased coordination, decreased attention, decreased awareness, decreased memory and delayed processing and decreased standing balance, decreased balance strategies and difficulty maintaining precautions and therefore will continue to benefit from skilled OT intervention to enhance overall performance with BADL.  Patient progressing toward long term goals..  Continue plan of care.  OT Short Term Goals Week 1:  OT Short Term Goal 1 (Week 1): STG= LTG d/t ELOS OT Short Term Goal 1 - Progress (Week 1): Progressing toward goal Week 2:  OT Short Term Goal 1 (Week 2): STG=LTG due to ELOS   Leroy Libman 07/14/2019, 6:40 AM

## 2019-07-15 ENCOUNTER — Inpatient Hospital Stay (HOSPITAL_COMMUNITY): Payer: Medicare HMO | Admitting: Physical Therapy

## 2019-07-15 ENCOUNTER — Inpatient Hospital Stay (HOSPITAL_COMMUNITY): Payer: Medicare HMO

## 2019-07-15 ENCOUNTER — Inpatient Hospital Stay (HOSPITAL_COMMUNITY): Payer: Medicare HMO | Admitting: Speech Pathology

## 2019-07-15 MED ORDER — TAMSULOSIN HCL 0.4 MG PO CAPS
0.8000 mg | ORAL_CAPSULE | Freq: Every day | ORAL | Status: DC
  Administered 2019-07-15 – 2019-07-22 (×8): 0.8 mg via ORAL
  Filled 2019-07-15 (×8): qty 2

## 2019-07-15 MED ORDER — QUETIAPINE FUMARATE 25 MG PO TABS
25.0000 mg | ORAL_TABLET | Freq: Two times a day (BID) | ORAL | Status: DC
  Administered 2019-07-15 – 2019-07-18 (×7): 25 mg via ORAL
  Filled 2019-07-15 (×6): qty 1

## 2019-07-15 MED ORDER — CIPROFLOXACIN HCL 500 MG PO TABS
250.0000 mg | ORAL_TABLET | Freq: Two times a day (BID) | ORAL | Status: DC
  Administered 2019-07-15 – 2019-07-22 (×15): 250 mg via ORAL
  Filled 2019-07-15 (×15): qty 1

## 2019-07-15 NOTE — Progress Notes (Signed)
Occupational Therapy Session Note  Patient Details  Name: Lonnie [name] MRN: [id number] Date of Birth: 1951/03/09  Today's Date: 07/15/2019 OT Individual Time: 0700-0810 OT Individual Time Calculation (min): 70 min    Short Term Goals: Week 2:  OT Short Term Goal 1 (Week 2): STG=LTG due to ELOS  Skilled Therapeutic Interventions/Progress Updates:    Pt resting in bed eating breakfast upon arrival.  Pt agreeable to therapy including taking a shower this morning.  Pt required CGA when standing for LB bathing/dressing. Pt amb with RW to bathroom for shower.  Pt returned to room and sat EOB for dressing tasks.  Pt requires more than a reasonable amount of time to complete all tasks.  Pt amb with RW to sink and stood while brushing teeth.  MD entered room and pt became agitated during discussion regarding urinary retention and medical necessity for catherization.  Pt adamant that he will refuse I/o. Continued discussion after MD exited room.  Pt continues to refuse I/o.  Pt remained in bed with all needs within reach and bed alarm actiavted.   Therapy Documentation Precautions:  Precautions Precautions: Fall Restrictions Weight Bearing Restrictions: No Pain:  Pt c/o ongoing back and L shoulder pain; warm shower and repositioned   Therapy/Group: Individual Therapy  Leroy Libman 07/15/2019, 8:16 AM

## 2019-07-15 NOTE — Progress Notes (Signed)
Social Work Patient ID: Lonnie [name], male   DOB: [birth year], 68 y.o.   MRN: [id number]   CSW met with pt to update him on team conference discussion and targeted d/c date of 07-23-19.  Pt expressed understanding of his need to be on CIR and is accepting of another week.  CSW spoke with dtr and wife via telephone and explained the same to them.  They will come for family education on 07-21-19.  They are a caregiver will be with pt 24/7.  CSW will continue to follow and assist as needed.

## 2019-07-15 NOTE — Plan of Care (Signed)
  Problem: RH Balance Goal: LTG Patient will maintain dynamic standing balance (PT) Description: LTG:  Patient will maintain dynamic standing balance with assistance during mobility activities (PT) Flowsheets (Taken 07/15/2019 0954) LTG: Pt will maintain dynamic standing balance during mobility activities with:: (goal downgraded) Supervision/Verbal cueing Note: Goal downgraded due to decreased cognitive functioning and safety awareness.   Problem: Sit to Stand Goal: LTG:  Patient will perform sit to stand with assistance level (PT) Description: LTG:  Patient will perform sit to stand with assistance level (PT) Flowsheets (Taken 07/15/2019 0954) LTG: PT will perform sit to stand in preparation for functional mobility with assistance level: (Goal downgraded) Supervision/Verbal cueing Note: Goal downgraded due to decreased cognitive functioning and safety awareness.   Problem: RH Bed Mobility Goal: LTG Patient will perform bed mobility with assist (PT) Description: LTG: Patient will perform bed mobility with assistance, with/without cues (PT). Flowsheets (Taken 07/15/2019 0954) LTG: Pt will perform bed mobility with assistance level of: (goal downgraded) Supervision/Verbal cueing Note: Goal downgraded due to decreased cognitive functioning and safety awareness.   Problem: RH Bed to Chair Transfers Goal: LTG Patient will perform bed/chair transfers w/assist (PT) Description: LTG: Patient will perform bed to chair transfers with assistance (PT). Flowsheets (Taken 07/15/2019 0954) LTG: Pt will perform Bed to Chair Transfers with assistance level: (goal downgraded) Supervision/Verbal cueing Note: Goal downgraded due to decreased cognitive functioning and safety awareness.   Problem: RH Car Transfers Goal: LTG Patient will perform car transfers with assist (PT) Description: LTG: Patient will perform car transfers with assistance (PT). Flowsheets (Taken 07/09/2019 1245) LTG: Pt will perform car  transfers with assist:: Supervision/Verbal cueing   Problem: RH Furniture Transfers Goal: LTG Patient will perform furniture transfers w/assist (OT/PT) Description: LTG: Patient will perform furniture transfers  with assistance (OT/PT). Flowsheets (Taken 07/15/2019 0954) LTG: Pt will perform furniture transfers with assist:: (goal downgraded) Supervision/Verbal cueing Note: Goal downgraded due to decreased cognitive functioning and safety awareness.   Problem: RH Floor Transfers Goal: LTG Patient will perform floor transfers w/assist (PT) Description: LTG: Patient will perform floor transfers with assistance (PT). Flowsheets (Taken 07/09/2019 1245) LTG: PT WILL PERFORM FLOOR TRANFERS  WITH  ASSIST:: Minimal Assistance - Patient > 75%   Problem: RH Ambulation Goal: LTG Patient will ambulate in controlled environment (PT) Description: LTG: Patient will ambulate in a controlled environment, # of feet with assistance (PT). Flowsheets (Taken 07/09/2019 1245) LTG: Pt will ambulate in controlled environ  assist needed:: Supervision/Verbal cueing Goal: LTG Patient will ambulate in home environment (PT) Description: LTG: Patient will ambulate in home environment, # of feet with assistance (PT). Flowsheets Taken 07/15/2019 0954 LTG: Pt will ambulate in home environ  assist needed:: (goal downgraded) Supervision/Verbal cueing Taken 07/09/2019 1245 LTG: Ambulation distance in home environment: 99' with LRAD Note: Goal downgraded due to decreased cognitive functioning and safety awareness.   Problem: RH Stairs Goal: LTG Patient will ambulate up and down stairs w/assist (PT) Description: LTG: Patient will ambulate up and down # of stairs with assistance (PT) Flowsheets (Taken 07/09/2019 1245) LTG: Pt will ambulate up/down stairs assist needed:: Supervision/Verbal cueing LTG: Pt will  ambulate up and down number of stairs: 13 with 1 rail to simulate home set up.

## 2019-07-15 NOTE — Progress Notes (Addendum)
[facility] PHYSICAL MEDICINE & REHABILITATION PROGRESS NOTE   Subjective/Complaints:  Refusing cath, doesn't feel full but has >69ml in bladder, "your cath caused this "  ROS- neg for CP SOB, N/V/D Objective:   Dg Lumbar Spine 2-3 Views  Result Date: 07/14/2019 CLINICAL DATA:  Chronic low back pain, no known injury. EXAM: LUMBAR SPINE - 2-3 VIEW COMPARISON:  None. FINDINGS: Mild disc space narrowing and vacuum disc at L4-5. Normal alignment. Slight wedged appearance of L1. Aortic atherosclerosis. No visible aneurysm. IMPRESSION: Degenerative disc disease at L4-5. Slight wedged appearance of L1 of unknown chronicity. Electronically Signed   By: Rolm Baptise M.D.   On: 07/14/2019 09:53   No results for input(s): WBC, HGB, HCT, PLT in the last 72 hours. Recent Labs    07/12/19 0927 07/14/19 1217  NA 132* 135  K 4.6 4.5  CL 100 103  CO2 22 22  GLUCOSE 128* 97  BUN 17 13  CREATININE 1.10 1.04  CALCIUM 9.1 9.1    Intake/Output Summary (Last 24 hours) at 07/15/2019 0721 Last data filed at 07/14/2019 2231 Gross per 24 hour  Intake 1320 ml  Output 1750 ml  Net -430 ml     Physical Exam: Vital Signs Blood pressure (!) 154/92, pulse (!) 53, temperature 98.6 F (37 C), temperature source Oral, resp. rate 19, height 6\' 2"  (1.88 m), weight 75.2 kg, SpO2 99 %.   General: No acute distress Mood and affect are appropriate Heart: Regular rate and rhythm no rubs murmurs or extra sounds Lungs: Clear to auscultation, breathing unlabored, no rales or wheezes Abdomen: Positive bowel sounds, soft nontender to palpation, nondistended Extremities: No clubbing, cyanosis, or edema Skin: No evidence of breakdown, Left scalp staples CDI , subq fluid around the incision no eythema Neurologic good sitting balance  Musculoskeletal: no spine deformity, moves left shoulder without pain   Assessment/Plan: 1. Functional deficits secondary to L SDH  which require 3+ hours per day of interdisciplinary  therapy in a comprehensive inpatient rehab setting.  Physiatrist is providing close team supervision and 24 hour management of active medical problems listed below.  Physiatrist and rehab team continue to assess barriers to discharge/monitor patient progress toward functional and medical goals  Care Tool:  Bathing    Body parts bathed by patient: Right arm, Left arm, Chest, Abdomen, Front perineal area, Buttocks, Right upper leg, Left upper leg, Right lower leg, Left lower leg, Face   Body parts bathed by helper: Right lower leg, Left lower leg     Bathing assist Assist Level: Supervision/Verbal cueing     Upper Body Dressing/Undressing Upper body dressing   What is the patient wearing?: Pull over shirt    Upper body assist Assist Level: Supervision/Verbal cueing    Lower Body Dressing/Undressing Lower body dressing      What is the patient wearing?: Hospital gown only     Lower body assist Assist for lower body dressing: Contact Guard/Touching assist     Toileting Toileting    Toileting assist Assist for toileting: Moderate Assistance - Patient 50 - 74%     Transfers Chair/bed transfer  Transfers assist     Chair/bed transfer assist level: Supervision/Verbal cueing Chair/bed transfer assistive device: Programmer, multimedia   Ambulation assist      Assist level: Supervision/Verbal cueing Assistive device: Walker-rolling Max distance: 150'   Walk 10 feet activity   Assist     Assist level: Supervision/Verbal cueing Assistive device: Walker-rolling   Walk 50  feet activity   Assist Walk 50 feet with 2 turns activity did not occur: Safety/medical concerns(limited by pain (headache))  Assist level: Supervision/Verbal cueing Assistive device: Walker-rolling    Walk 150 feet activity   Assist Walk 150 feet activity did not occur: Safety/medical concerns(limited by pain (headache))  Assist level: Supervision/Verbal cueing Assistive  device: Walker-rolling    Walk 10 feet on uneven surface  activity   Assist Walk 10 feet on uneven surfaces activity did not occur: Safety/medical concerns(limited by pain (headache))         Wheelchair     Assist Will patient use wheelchair at discharge?: No   Wheelchair activity did not occur: N/A(Patient is ambulatory)         Wheelchair 50 feet with 2 turns activity    Assist    Wheelchair 50 feet with 2 turns activity did not occur: N/A(Patient is ambulatory)       Wheelchair 150 feet activity     Assist Wheelchair 150 feet activity did not occur: N/A(Patient is ambulatory)          Medical Problem List and Plan: 1.Functional deficits and right hemiparesissecondary to left fronto-parietal SDH CIR PT, OT, SLP evals   TBI,agitation alternating with somnolence, SDH unchanged but cerebellar vermis blood resolved  Will order seroquel for agitation   2. Antithrombotics: -DVT/anticoagulation:Mechanical:Sequential compression devices, below kneeBilateral lower extremities -antiplatelet therapy:  3. Pain Management:tylenol prn.c/o left shoulder pain but exam is neg, check Xray , suspect impingement, with periscapular muscle atrophy suspect chronic RCT vs suprascpular nerve injury (less common) OT will see if some fxnl activity may aggravate  Increased HA pain, due for Norco has been taking 3-4 x per day, if no relief may need to re image 4. Mood:LCSW to follow for evaluation and support. -antipsychotic agents: N/A 5. Neuropsych: This patientis notcapable of making decisions on hisown behalf.  The patient had likely early onset frontotemporal dementia according to neuropsych which has been exacerbated by the TBI and now with UTI  Repeat CT head neg 6. Skin/Wound Care:Routine pressure relief measures.incision with sub cut fluid but no leakage, no erythema or tenderness 7.  Fluids/Electrolytes/Nutrition:Monitor I/O. Check lytes in am.  8. HTN: Monitor BP tid--continueAvapro and metoprolol. 9. CAD s/p CABG:Continue Lipitor. 10. Bradycardia: --monitor for symptoms. Will set parameters on BB. Vitals:   07/14/19 1928 07/15/19 0436  BP: (!) 156/66 (!) 154/92  Pulse: 61 (!) 53  Resp: 18 19  Temp: 98.8 F (37.1 C) 98.6 F (37 C)  SpO2: 100% 99%  elevated systolic has reportedly been on another BP med in past but was allergic (ACE-I)11.Tobacco use: On nicotine patch. 12. Constipation: Has not had BM since last Sunday.Refusing laxative at this time--wants to continue stool softner only.  13.  UTI await cx,100K GNR  on Bactrim switch to cipro 250mg  BID for Pseudomonas 14.  Urinary retention , likely multifactorial, explained in detail to pt that this is likelyt due to immobility, probable BPH and UTI , rec cath but pt refusing (states you are only cathing me for the money)  Discussed risk of kidney damage worsening of medical condition and possible transfer Pt becomes agitated when discussing  LOS: 7 days A FACE TO FACE EVALUATION WAS PERFORMED  Erick Colacendrew E Viaan Knippenberg 07/15/2019, 7:21 AM

## 2019-07-15 NOTE — Progress Notes (Signed)
Physical Therapy Session Note  Patient Details  Name: Lonnie [name] MRN: [id number] Date of Birth: [birth year]  Today's Date: 07/15/2019 PT Individual Time: 1130-1200 PT Individual Time Calculation (min): 30 min   Short Term Goals: Week 1:  PT Short Term Goal 1 (Week 1): STG=LTG due to short ELOS  Skilled Therapeutic Interventions/Progress Updates:   Pt in supine and agreeable to therapy, no c/o pain but requesting to toilet. Supine>sit w/ supervision. Min assist sit<>stands from bed and from toilet 2/2 low surface, pt needed assist to boost and stabilize in stance. CGA toilet transfer and CGA-min assist for dynamic standing balance while pt performed LE garment management. Pt w/ continent void. Ambulated to sink w/ CGA and stood to wash hands, CGA and verbal cues for sequencing. Ambulated to/from therapy gym w/ CGA-min assist overall. Pt easily distracted and turning head to speak to this therapist and losing balance, min assist to correct. Verbal cues for gait pattern, to increase R foot clearance, and tactile cues for upright posture. Pt able to find his way to therapy gym w/ 1-2 verbal cues. Returned to room and ended session in supine, all needs in reach.   Therapy Documentation Precautions:  Precautions Precautions: Fall Restrictions Weight Bearing Restrictions: No Pain: Pain Assessment Pain Scale: 0-10 Pain Score: 6  Pain Type: Chronic pain Pain Location: Back Pain Orientation: Lower Pain Descriptors / Indicators: Aching Pain Frequency: Intermittent Pain Intervention(s): Medication (See eMAR)  Therapy/Group: Individual Therapy  Sheral Pfahler Clent Demark 07/15/2019, 12:05 PM

## 2019-07-15 NOTE — Progress Notes (Signed)
Speech Language Pathology Daily Session Note  Patient Details  Name: Lonnie [name] MRN: [id number] Date of Birth: 1951-07-26  Today's Date: 07/15/2019 SLP Individual Time: 1400-1440 SLP Individual Time Calculation (min): 40 min  Short Term Goals: Week 2: SLP Short Term Goal 1 (Week 2): Patient will demonstrate functional problem solving for basic and familiar tasks with Min A verbal cues. SLP Short Term Goal 2 (Week 2): Patient will recall daily information with use of memory compensatory strategies with Mod A verbal cues. SLP Short Term Goal 3 (Week 2): Patient will demosntrate selective attention to functional tasks in a mildly distracting enviornment for 30 minutes with Min verbal cues for redirection. SLP Short Term Goal 4 (Week 2): Patient will self-monitor and correct errors as well as verbosity during functional tasks with Mod A verbal cues.  Skilled Therapeutic Interventions: Skilled treatment session focused on cognitive goals. SLP facilitated session by providing Min A verbal cues for patient to self-monitor and correct errors during a mildly complex problem solving and attention task in which the patient had to match a number to a color in order to make a specific pattern. Patient demonstrated selective attention to task for ~30 minutes with supervision level verbal cues. Patient reporting pain and fatigue and requested to complete task in another session. Therefore, patient missed remaining 20 minutes of session. Patient left supine in bed with alarm on and all needs within reach. Continue with current plan of care.      Pain Pain in back and head, patient repositioned   Therapy/Group: Individual Therapy  Marsha Hillman 07/15/2019, 2:49 PM

## 2019-07-15 NOTE — Progress Notes (Signed)
Per nurse tech Raquel Sarna and [facility] bladder scan shows 681ml but refuses cath. Pt unable to void. Pt educated on the importance of emptying his bladder via urinary cath. Pt states" I was peeing fine until yall started cathing me, Im going to sue everyone of yall who started this." Pt eduated on the risk and benefits of emptying and not emptying his bladder via urinary catheter. Pt states " Im going to sue the doctor and everyone who had something to do with this cath". Pt still refuses urinary cath at this time. Will pass along to provider and oncoming nurse this am.

## 2019-07-15 NOTE — Patient Care Conference (Signed)
Inpatient RehabilitationTeam Conference and Plan of Care Update Date: 07/13/2019   Time: 11:20 AM    Patient Name: Lonnie [name]      Medical Record Number: [id number]  Date of Birth: [birth year] Sex: Male         Room/Bed: 4M05C/4M05C-01 Payor Info: Payor: [employer] MEDICARE / Plan: [employer] MEDICARE HMO/PPO / Product Type: *No Product type* /    Admitting Diagnosis: 5. Gen Team closed TBI; traumatic SDH; 11-14 days  Admit Date/Time:  07/08/2019  5:02 PM Admission Comments: No comment available   Primary Diagnosis:  <principal problem not specified> Principal Problem: <principal problem not specified>  Patient Active Problem List   Diagnosis Date Noted  . SDH (subdural hematoma) ([place]) 07/08/2019  . Subdural hematoma ([place]) 06/30/2019  . Erectile dysfunction 07/28/2018  . Peripheral vascular disease ([place]) 07/16/2017  . Anxiety and depression 07/16/2017  . PCP NOTES >>>>>>>>>>>> 07/08/2016  . Dermatitis 04/19/2015  . Elevated LFTs 02/12/2015  . Hyperglycemia 09/01/2014  . DJD (degenerative joint disease) 09/01/2014  . Annual physical exam 09/01/2014  . S/P CABG x 4 11/07/2013  . HTN (hypertension) 10/29/2013  . Tobacco abuse 10/29/2013  . ST elevation myocardial infarction (STEMI) of anterior wall ([place]) 10/29/2013  . Coronary atherosclerosis of native coronary artery 10/29/2013    Expected Discharge Date: Expected Discharge Date: 07/23/19  Team Members Present: Physician leading conference: Dr. Alysia Penna Social Worker Present: Alfonse Alpers, LCSW Nurse Present: Dorien Chihuahua, RN PT Present: Apolinar Junes, PT OT Present: Roanna Epley, COTA PPS Coordinator present : Gunnar Fusi, SLP     Current Status/Progress Goal Weekly Team Focus  Medical   Continues with confusion, exacerbated by UTI, per neuropsych has underlying early frontotemporal dementia  Maintain medical stability reduce fall risk  Treat UTI await sensitivities   Bowel/Bladder   Continent of Bowel,  I/O CATH q6-8  hrs. LBM 07/11/19  self cath- toileting program  Assess toileting needs QS/PRN,   Swallow/Nutrition/ Hydration             ADL's   initially min A overall but has regressed and currently mod A for transfers, mod A for bathing and UB dressing, max A for LB dressing; labile and tearful  mod I overall  activity tolerance, BADL training, functional transfers, standing balance, safety awareness   Mobility   S bed mobility, min A sit<>stand and SPT with RW, CGA gait 20' consistently, 200'x1 with RW  S-mod I overall, including 13 steps with R rail  standing dynamic balance, gait training, activity tolerance, strengthening, patient/family education   Communication             Safety/Cognition/ Behavioral Observations  Max A  Min-Mod A  problem solving, recall, attention and awareness   Pain   Norco  5-325 mg Q 4hrs prn, rate pain 10/10  < 3 on pain scale 0/10  Assess QS /PRN , evaluate   Skin   s/p left cranio -sraples intact ni drainage  Skin reamins free of infection  Assess QS.PRN    Rehab Goals Patient on target to meet rehab goals: Yes Rehab Goals Revised: none *See Care Plan and progress notes for long and short-term goals.     Barriers to Discharge  Current Status/Progress Possible Resolutions Date Resolved   Physician    Medical stability     Slow progress towards goals  See above      Nursing  PT  Home environment access/layout;Incontinence  7 STE with B rails and 3 floors 13 steps with R rail between floors              OT                  SLP                SW                Discharge Planning/Teaching Needs:  Pt plans to return to his home where his wife, dtr, and maybe a paid caregiver will provide 24/7 supervision.  [name] and dtr plan to come in on 07-21-19 for family education prior to pt's d/c.   Team Discussion:  Pt with large SDH resulting in L crani.  Pt's cerebellar bleed is gone and SDH is no worse per CT this week.  Pt  does have a UTI, so this could be causing his confusion and MD wonders if pt has early left frontal dementia.  Pt's shoulder xray was clear.  Pt c/o of low back pain/lumbar region.  He has needed I&O caths q 6 hours since UTI.  Pt was irritable late at night before bed on Tuesday.  Pt did better on day of eval, as he was not as confused.  S/mod I goals.  Pt walking 20' now with RW and needs mod/max A which is worse than eval.  Has to be able to do steps.  ST agrees that pt was better on day of eval compared to now.  Team hoping UTI is to blame and pt will clear with treatment.  Revisions to Treatment Plan:  none    Continued Need for Acute Rehabilitation Level of Care: The patient requires daily medical management by a physician with specialized training in physical medicine and rehabilitation for the following conditions: Daily direction of a multidisciplinary physical rehabilitation program to ensure safe treatment while eliciting the highest outcome that is of practical value to the patient.: Yes Daily medical management of patient stability for increased activity during participation in an intensive rehabilitation regime.: Yes Daily analysis of laboratory values and/or radiology reports with any subsequent need for medication adjustment of medical intervention for : Neurological problems   I attest that I was present, lead the team conference, and concur with the assessment and plan of the team.Team conference was held via web/ teleconference due to COVID - 19.   Finian Helvey, Vista DeckJennifer Capps 07/15/2019, 5:59 PM

## 2019-07-15 NOTE — Progress Notes (Signed)
Physical Therapy Weekly Progress Note  Patient Details  Name: Lonnie [name] MRN: [id number] Date of Birth: Mar 30, 1951  Beginning of progress report period: July 09, 2019 End of progress report period: July 15, 2019  Today's Date: 07/15/2019 PT Individual Time: 0915-1030 PT Individual Time Calculation (min): 75 min   Patient did not have STG set due to short ELOS. Is progressing well towards LTG. Currently requires CGA-supervision for bed mobility, transfers, gait using a RW up to 200 feet, and min A for 12 steps with B rails.  Patient continues to demonstrate the following deficits muscle weakness, decreased cardiorespiratoy endurance, abnormal tone and decreased coordination, central origin and decreased standing balance, decreased postural control and decreased balance strategies and therefore will continue to benefit from skilled PT intervention to increase functional independence with mobility.  Patient progressing toward long term goals..  Plan of care revisions: modified goals to supervision level due to impaired cognition and safety awareness..  PT Short Term Goals Week 1:  PT Short Term Goal 1 (Week 1): STG=LTG due to short ELOS Week 2:  PT Short Term Goal 1 (Week 2): STG=LTG due to ELOS.  Skilled Therapeutic Interventions/Progress Updates:     Patient in bed upon PT arrival. Patient alert and agreeable to PT session. Patient stated that he does not want to have in/out cath any more and requested to use the bathroom.  Therapeutic Activity: Bed Mobility: Patient performed supine to/from sit with supervision in a flat bed without rails and on a mat table. Also performed rolling supine to/form prone on a mat table with supervision, however complained of L UE muscle soreness in bicep and shoulder most discomfort with manual resistance to pectoralis major muscle testing, RN made aware. Attempted prone lying to address low back pain, but patient unable to tolerate prone due to L UE  discomfort.  Transfers: Patient performed sit to/from stand x4 and a toilet transfer using the RW with CGA-close supervision. Provided verbal cues for reaching back to sit and lowering with controlled descent. Required supervision for peri-care and CGA for LB dressing during toileting. Patient was able to void a moderate amount sitting on the toilet, RN made aware. Educated patient on calling to void to use the toilet instead of the urinal for increased success with voiding voluntarily, RN made aware.   Gait Training:  Patient ambulated 100 feet x2 and 15 feet x2 using RW with CGA. Ambulated with increased forward trunk flexion and B hip and knee flexion today, continues to have decreased step length and foot clearance on R. Provided verbal cues for erect posture, and increased R step length and height.  Therapeutic Exercise: Patient performed the following exercises with verbal and tactile cues for proper technique. -B LAQ 2x10 -sit to/from stand without UE support (arms crossed across his chest) 2x5 with focus on controlled descent for LE strengthening with functional mobility  Patient in bed at end of session with breaks locked, bed alarm set, and all needs within reach. Educated patient on benefits of being out of bed, decreased cognitive function and delayed processing, and home safety to reduce fall risk during session.    Therapy Documentation Precautions:  Precautions Precautions: Fall Restrictions Weight Bearing Restrictions: No Pain: Patient reported low back pain and L shoulder/arm pain during session, did not provide a numeric rating. PT provided repositioning, rest breaks, and distraction throughout session as pain interventions.   Therapy/Group: Individual Therapy  Melisha Eggleton L Traci Plemons PT, DPT  07/15/2019, 12:45 PM

## 2019-07-16 ENCOUNTER — Inpatient Hospital Stay (HOSPITAL_COMMUNITY): Payer: Medicare HMO | Admitting: Speech Pathology

## 2019-07-16 DIAGNOSIS — T83511D Infection and inflammatory reaction due to indwelling urethral catheter, subsequent encounter: Secondary | ICD-10-CM

## 2019-07-16 NOTE — Plan of Care (Signed)
  Problem: RH BOWEL ELIMINATION Goal: RH STG MANAGE BOWEL WITH ASSISTANCE Description: STG Manage Bowel with min Assistance. Outcome: Progressing   Problem: RH BLADDER ELIMINATION Goal: RH STG MANAGE BLADDER WITH ASSISTANCE Description: STG Manage Bladder With min Assistance Outcome: Progressing   

## 2019-07-16 NOTE — Progress Notes (Signed)
Speech Language Pathology Daily Session Note  Patient Details  Name: Lonnie [name] MRN: [id number] Date of Birth: Jan 22, 1951  Today's Date: 07/16/2019 SLP Individual Time: 1200-1230 SLP Individual Time Calculation (min): 30 min  Short Term Goals: Week 2: SLP Short Term Goal 1 (Week 2): Patient will demonstrate functional problem solving for basic and familiar tasks with Min A verbal cues. SLP Short Term Goal 2 (Week 2): Patient will recall daily information with use of memory compensatory strategies with Mod A verbal cues. SLP Short Term Goal 3 (Week 2): Patient will demosntrate selective attention to functional tasks in a mildly distracting enviornment for 30 minutes with Min verbal cues for redirection. SLP Short Term Goal 4 (Week 2): Patient will self-monitor and correct errors as well as verbosity during functional tasks with Mod A verbal cues.  Skilled Therapeutic Interventions:  Skilled treatment session focused on cognition goals. SLP received pt sitting edge of bed cutting his finger nails with clippers. Pt was very irritated with nursing requests for I&O cath d/t urinary retention. Pt was not able to be redirected and was very animated. Instead of interrupting pt's rant to ask him to turn off the TV (to reduce chaos), this writer reached to press off button and in response pt grabbed call bell and yelled "that is mine and you won't take it." After some coaxing and apology, pt able to engage in remainder of session but was not able to decrease verbosity and was not able to response appropriately to education regarding risk of urinary retention.      Pain Pain Assessment Pain Scale: 0-10 Pain Score: 0-No pain  Therapy/Group: Individual Therapy  Tyra Gural 07/16/2019, 2:33 PM

## 2019-07-16 NOTE — Progress Notes (Addendum)
Majesty Oehlert Dahir is a 68 y.o. male 1951/03/30 [id number]  Subjective: He is asleep.  Allows, RN is concerned about urinary retention with high residuals.  The patient has been refusing catheterization  Objective: Vital signs in last 24 hours: Temp:  [97.8 F (36.6 C)-98.8 F (37.1 C)] 97.8 F (36.6 C) (07/25 0610) Pulse Rate:  [57-60] 60 (07/25 0610) Resp:  [16-18] 16 (07/25 0610) BP: (116-146)/(67-88) 116/85 (07/25 0610) SpO2:  [98 %-100 %] 100 % (07/25 0610) Weight change:  Last BM Date: 07/14/19  Intake/Output from previous day: 07/24 0701 - 07/25 0700 In: 89 [P.O.:490] Out: 1725 [Urine:1725] Last cbgs: CBG (last 3)  No results for input(s): GLUCAP in the last 72 hours.   Physical Exam General: No apparent distress   HEENT: not dry Lungs: Normal effort. Lungs clear to auscultation, no crackles or wheezes. Cardiovascular: Regular rate and rhythm, no edema Abdomen: S/NT/ND; BS(+) Musculoskeletal:  unchanged Neurological: No new neurological deficits Wounds: N/A    Skin: clear  Aging changes Mental state: Asleep    Lab Results: BMET    Component Value Date/Time   NA 135 07/14/2019 1217   NA 143 03/01/2015 1414   K 4.5 07/14/2019 1217   CL 103 07/14/2019 1217   CO2 22 07/14/2019 1217   GLUCOSE 97 07/14/2019 1217   BUN 13 07/14/2019 1217   BUN 14 03/01/2015 1414   CREATININE 1.04 07/14/2019 1217   CALCIUM 9.1 07/14/2019 1217   GFRNONAA >60 07/14/2019 1217   GFRAA >60 07/14/2019 1217   CBC    Component Value Date/Time   WBC 10.0 07/09/2019 0525   RBC 5.31 07/09/2019 0525   HGB 14.5 07/09/2019 0525   HCT 44.9 07/09/2019 0525   PLT 269 07/09/2019 0525   MCV 84.6 07/09/2019 0525   MCH 27.3 07/09/2019 0525   MCHC 32.3 07/09/2019 0525   RDW 13.0 07/09/2019 0525   RDW 13.6 03/01/2015 1414   LYMPHSABS 2.5 07/09/2019 0525   LYMPHSABS 2.5 03/01/2015 1414   MONOABS 0.8 07/09/2019 0525   EOSABS 0.4 07/09/2019 0525   EOSABS 0.4 03/01/2015 1414   BASOSABS 0.1 07/09/2019 0525   BASOSABS 0.1 03/01/2015 1414    Studies/Results: No results found.  Medications: I have reviewed the patient's current medications.  Assessment/Plan:  1.  Subdural hematoma.  Continue CIR 2.  TBI.  Agitation alternating with somnolence.  On Seroquel 3.  DVT prophylaxis with SCDs 4.  Pain management with Tylenol 5.  The patient is not capable of making decisions on his own behalf.  It is likely that he has an early onset frontotemporal dementia according to neuropsychology which has been exacerbated by TBI. 6.  Routine skin and wound care 7.  Monitoring electrolytes and fluid balance 8.  Hypertension.  Continue Avapro, metoprolol 9.  Coronary artery disease status post bypass surgery.  Continue Lipitor 10.  Bradycardia.  Monitor for symptoms 11.  Constipation.  Patient was refusing laxative.  Stool softener 12.  Urinary tract infection.  On Cipro for Pseudomonas 13.  Urinary retention, likely multifactorial.  The patient has been refusing catheterization.  He is refusing catheterization this morning (he was able to void 150 with a residual of 750).  On Flomax.  Will consider adding Cardura, Proscar      Length of stay, days: 8  Walker Kehr , MD 07/16/2019, 11:38 AM

## 2019-07-16 NOTE — Progress Notes (Signed)
Dr Alain Marion was notified about pt. Refusing I&O cath. .Pt. void at 10 am 150 cc and bladder scan for 787 cc,RN had a long conversation with pt. About the needs for emptying the bladder,pt. Listened very respectfully but he still refusing the I&O cath..Keep assessing pt. Closely.

## 2019-07-17 DIAGNOSIS — I2510 Atherosclerotic heart disease of native coronary artery without angina pectoris: Secondary | ICD-10-CM

## 2019-07-17 DIAGNOSIS — I2583 Coronary atherosclerosis due to lipid rich plaque: Secondary | ICD-10-CM

## 2019-07-17 DIAGNOSIS — G3109 Other frontotemporal dementia: Secondary | ICD-10-CM

## 2019-07-17 DIAGNOSIS — F0281 Dementia in other diseases classified elsewhere with behavioral disturbance: Secondary | ICD-10-CM

## 2019-07-17 NOTE — Progress Notes (Signed)
Lonnie [name] is a 68 y.o. male 1951-08-29 [id number]  Subjective: No new complaints. No new problems. Slept well. Feeling OK.  Objective: Vital signs in last 24 hours: Temp:  [98.2 F (36.8 C)-98.4 F (36.9 C)] 98.2 F (36.8 C) (07/26 0457) Pulse Rate:  [60-63] 63 (07/26 0457) Resp:  [16-18] 16 (07/26 0457) BP: (120-140)/(73-96) 120/73 (07/26 0457) SpO2:  [100 %] 100 % (07/26 0457) Weight change:  Last BM Date: 07/16/19  Intake/Output from previous day: 07/25 0701 - 07/26 0700 In: 859 [P.O.:859] Out: 2700 [Urine:2700] Last cbgs: CBG (last 3)  No results for input(s): GLUCAP in the last 72 hours.   Physical Exam General: No apparent distress   HEENT: not dry Lungs: Normal effort. Lungs clear to auscultation, no crackles or wheezes. Cardiovascular: Regular rate and rhythm, no edema Abdomen: S/NT/ND; BS(+) Musculoskeletal:  unchanged Neurological: No new neurological deficits Wounds: N/A    Skin: clear  Aging changes Mental state: Alert, cooperative    Lab Results: BMET    Component Value Date/Time   NA 135 07/14/2019 1217   NA 143 03/01/2015 1414   K 4.5 07/14/2019 1217   CL 103 07/14/2019 1217   CO2 22 07/14/2019 1217   GLUCOSE 97 07/14/2019 1217   BUN 13 07/14/2019 1217   BUN 14 03/01/2015 1414   CREATININE 1.04 07/14/2019 1217   CALCIUM 9.1 07/14/2019 1217   GFRNONAA >60 07/14/2019 1217   GFRAA >60 07/14/2019 1217   CBC    Component Value Date/Time   WBC 10.0 07/09/2019 0525   RBC 5.31 07/09/2019 0525   HGB 14.5 07/09/2019 0525   HCT 44.9 07/09/2019 0525   PLT 269 07/09/2019 0525   MCV 84.6 07/09/2019 0525   MCH 27.3 07/09/2019 0525   MCHC 32.3 07/09/2019 0525   RDW 13.0 07/09/2019 0525   RDW 13.6 03/01/2015 1414   LYMPHSABS 2.5 07/09/2019 0525   LYMPHSABS 2.5 03/01/2015 1414   MONOABS 0.8 07/09/2019 0525   EOSABS 0.4 07/09/2019 0525   EOSABS 0.4 03/01/2015 1414   BASOSABS 0.1 07/09/2019 0525   BASOSABS 0.1 03/01/2015 1414     Studies/Results: No results found.  Medications: I have reviewed the patient's current medications.  Assessment/Plan:  1.  Subdural hematoma - continue CIR 2.  TBI.  Agitation alternating with somnolence.  On Seroquel 3.  DVT prophylaxis with SCDs 4.  Pain management with Tylenol PRN 5.  The patient is not capable of making decisions on his own behalf.  It is likely that she has an early onset of frontotemporal dementia according to neuropsychology which has been exacerbated by TBI. 6.  Routine skin and wound care 7.  Monitoring electrolytes and fluid balance 8.  Hypertension.  Continue Avapro, metoprolol 9.  Coronary artery disease, status post bypass surgery.  Continue Lipitor 10.  Bradycardia.  Monitor for symptoms 11.  Constipation.  The patient has been refusing laxatives.  Continue with stool softener 12.  Urinary tract infection.  On Cipro for Pseudomonas 13.  Urinary retention, likely multifactorial.  The patient has been refusing catheterization.       Length of stay, days: 9  Walker Kehr , MD 07/17/2019, 11:47 AM

## 2019-07-17 NOTE — Progress Notes (Signed)
Patient voiding throughout night. Bladder scan volumes 978 ml and the latest 858 ml at 0500. Pt refusing intermittent catheterizations. Pt has voided again at 0600, with output of 575 ml. Refused SCDs at HS. Otherwise cooperative with the direction of the staff throughout the night.

## 2019-07-18 ENCOUNTER — Inpatient Hospital Stay (HOSPITAL_COMMUNITY): Payer: Medicare HMO | Admitting: Speech Pathology

## 2019-07-18 ENCOUNTER — Inpatient Hospital Stay (HOSPITAL_COMMUNITY): Payer: Medicare HMO

## 2019-07-18 DIAGNOSIS — N390 Urinary tract infection, site not specified: Secondary | ICD-10-CM | POA: Diagnosis present

## 2019-07-18 DIAGNOSIS — G8918 Other acute postprocedural pain: Secondary | ICD-10-CM

## 2019-07-18 DIAGNOSIS — S069X9A Unspecified intracranial injury with loss of consciousness of unspecified duration, initial encounter: Secondary | ICD-10-CM

## 2019-07-18 DIAGNOSIS — S069XAA Unspecified intracranial injury with loss of consciousness status unknown, initial encounter: Secondary | ICD-10-CM

## 2019-07-18 DIAGNOSIS — R339 Retention of urine, unspecified: Secondary | ICD-10-CM

## 2019-07-18 DIAGNOSIS — S069X0A Unspecified intracranial injury without loss of consciousness, initial encounter: Secondary | ICD-10-CM

## 2019-07-18 DIAGNOSIS — R451 Restlessness and agitation: Secondary | ICD-10-CM

## 2019-07-18 DIAGNOSIS — B9620 Unspecified Escherichia coli [E. coli] as the cause of diseases classified elsewhere: Secondary | ICD-10-CM | POA: Diagnosis present

## 2019-07-18 LAB — CBC
HCT: 46 % (ref 39.0–52.0)
Hemoglobin: 14.5 g/dL (ref 13.0–17.0)
MCH: 27 pg (ref 26.0–34.0)
MCHC: 31.5 g/dL (ref 30.0–36.0)
MCV: 85.7 fL (ref 80.0–100.0)
Platelets: 363 10*3/uL (ref 150–400)
RBC: 5.37 MIL/uL (ref 4.22–5.81)
RDW: 12.9 % (ref 11.5–15.5)
WBC: 10.2 10*3/uL (ref 4.0–10.5)
nRBC: 0 % (ref 0.0–0.2)

## 2019-07-18 MED ORDER — QUETIAPINE FUMARATE 50 MG PO TABS
50.0000 mg | ORAL_TABLET | Freq: Two times a day (BID) | ORAL | Status: DC
  Administered 2019-07-18 – 2019-07-23 (×10): 50 mg via ORAL
  Filled 2019-07-18 (×10): qty 1

## 2019-07-18 NOTE — Progress Notes (Signed)
Patient urinated 833ml in the urinal, PVR was 535ml. He refused to be cathed. "I think that I have a larger bladder", he stated. Explained to patient that the bladder does stretch, but has a breaking point. After it is full the urine backs up into the kidneys. When informed that he still has 545 ml in his bladder, he stated " I guess you'll have to vacume it out". As soon as this nurse mention a catheterization, he stated, "no cath, no cath", while waving his hands. No acute distress noted & no visible distention noted. Will continue to monitor

## 2019-07-18 NOTE — Progress Notes (Addendum)
[place] PHYSICAL MEDICINE & REHABILITATION PROGRESS NOTE   Subjective/Complaints: Patient seen sitting up at the edge of his bed this morning working with therapies.  Good sitting balance noted.  He states he did not sleep well overnight and did not have a good weekend because he is angry and "hits displaced".  When asked why he states "any average human being with know why".  He states he has left shoulder pain, but does not want any intervention.  Discussed behavior and inconsistent participation with OT.  ROS-denies for CP SOB, N/V/D  Objective:   No results found. Recent Labs    07/18/19 0807  WBC 10.2  HGB 14.5  HCT 46.0  PLT 363   No results for input(s): NA, K, CL, CO2, GLUCOSE, BUN, CREATININE, CALCIUM in the last 72 hours.  Intake/Output Summary (Last 24 hours) at 07/18/2019 1006 Last data filed at 07/18/2019 0727 Gross per 24 hour  Intake 480 ml  Output 2100 ml  Net -1620 ml     Physical Exam: Vital Signs Blood pressure (!) 141/79, pulse 61, temperature 98.1 F (36.7 C), temperature source Oral, resp. rate 17, height 6\' 2"  (1.88 m), weight 75.2 kg, SpO2 100 %. Constitutional: No distress . Vital signs reviewed. HENT: Normocephalic.  Atraumatic. Eyes: EOMI. No discharge. Cardiovascular: No JVD. Respiratory: Normal effort. GI: Non-distended. Musc: No edema or tenderness in extremities. Neuro: Alert and oriented x3 5/5 throughout Skin: Left scalp staples C/D/I Psych: Angry.  Assessment/Plan: 1. Functional deficits secondary to L SDH  which require 3+ hours per day of interdisciplinary therapy in a comprehensive inpatient rehab setting.  Physiatrist is providing close team supervision and 24 hour management of active medical problems listed below.  Physiatrist and rehab team continue to assess barriers to discharge/monitor patient progress toward functional and medical goals  Care Tool:  Bathing    Body parts bathed by patient: Right arm, Left arm,  Chest, Abdomen, Front perineal area, Buttocks, Right upper leg, Left upper leg, Right lower leg, Left lower leg, Face   Body parts bathed by helper: Right lower leg, Left lower leg     Bathing assist Assist Level: Supervision/Verbal cueing     Upper Body Dressing/Undressing Upper body dressing   What is the patient wearing?: Pull over shirt    Upper body assist Assist Level: Set up assist    Lower Body Dressing/Undressing Lower body dressing      What is the patient wearing?: Underwear/pull up, Pants     Lower body assist Assist for lower body dressing: Supervision/Verbal cueing     Toileting Toileting    Toileting assist Assist for toileting: Supervision/Verbal cueing     Transfers Chair/bed transfer  Transfers assist     Chair/bed transfer assist level: Contact Guard/Touching assist Chair/bed transfer assistive device: Geologist, engineeringWalker   Locomotion Ambulation   Ambulation assist      Assist level: Contact Guard/Touching assist Assistive device: Walker-rolling Max distance: 100'   Walk 10 feet activity   Assist     Assist level: Contact Guard/Touching assist Assistive device: Walker-rolling   Walk 50 feet activity   Assist Walk 50 feet with 2 turns activity did not occur: Safety/medical concerns(limited by pain (headache))  Assist level: Contact Guard/Touching assist Assistive device: Walker-rolling    Walk 150 feet activity   Assist Walk 150 feet activity did not occur: Safety/medical concerns(limited by pain (headache))  Assist level: Minimal Assistance - Patient > 75% Assistive device: Walker-rolling    Walk 10 feet on uneven  surface  activity   Assist Walk 10 feet on uneven surfaces activity did not occur: Safety/medical concerns(limited by pain (headache))         Wheelchair     Assist Will patient use wheelchair at discharge?: No   Wheelchair activity did not occur: N/A(Patient is ambulatory)         Wheelchair 50 feet  with 2 turns activity    Assist    Wheelchair 50 feet with 2 turns activity did not occur: N/A(Patient is ambulatory)       Wheelchair 150 feet activity     Assist Wheelchair 150 feet activity did not occur: N/A(Patient is ambulatory)          Medical Problem List and Plan: 1.Functional deficits and right hemiparesissecondary to left fronto-parietal SDH/TBI Continue CIR  Repeat CT head on 7/20 personally reviewed- stable/improving, empty sella noted  Notes reviewed-?  Slowly progressing SDH status post craniotomy, weekend notes reviewed- stable, labs personally reviewed  Seroquel increased to 50 twice daily on 7/27 2. Antithrombotics: -DVT/anticoagulation:Mechanical:Sequential compression devices, below kneeBilateral lower extremities -antiplatelet therapy:  3. Pain Management:tylenol prn.  Appears relatively controlled on 7/27 with meds 4. Mood:LCSW to follow for evaluation and support. 5. Neuropsych: This patientis not capable of making decisions on hisown behalf.    The patient had likely early onset frontotemporal dementia according to neuropsych which has been exacerbated by the TBI  Will consider anxiolytic 6. Skin/Wound Care:Routine pressure relief measures. 7. Fluids/Electrolytes/Nutrition:Monitor I/Os 8. HTN: Monitor BP   ContinueAvapro and metoprolol.  Slightly labile, but relatively controlled on 7/27  9. CAD s/p CABG:Continue Lipitor. 10. Bradycardia: --monitor for symptoms. Will set parameters on BB. Vitals:   07/17/19 1526 07/18/19 0451  BP:  (!) 141/79  Pulse: 67 61  Resp:  17  Temp:  98.1 F (36.7 C)  SpO2: 100% 100%   Relatively controlled on 7/27 11.Tobacco use: On nicotine patch. 12. Constipation:   Bowel meds available 13.  UTI   Bactrim switch to cipro 250mg  BID for Pseudomonas on 7/24  CBC within normal limits on 7/27 14.  Urinary retention  Multifactorial-patient refusing caths    BMP ordered for tomorrow  LOS: 10 days A FACE TO FACE EVALUATION WAS PERFORMED   Lorie Phenix 07/18/2019, 10:06 AM

## 2019-07-18 NOTE — Progress Notes (Signed)
Speech Language Pathology Daily Session Note  Patient Details  Name: Lonnie [name] MRN: [id number] Date of Birth: [birth year]  Today's Date: 07/18/2019 SLP Individual Time: 1000-1055 SLP Individual Time Calculation (min): 55 min  Short Term Goals: Week 2: SLP Short Term Goal 1 (Week 2): Patient will demonstrate functional problem solving for basic and familiar tasks with Min A verbal cues. SLP Short Term Goal 2 (Week 2): Patient will recall daily information with use of memory compensatory strategies with Mod A verbal cues. SLP Short Term Goal 3 (Week 2): Patient will demosntrate selective attention to functional tasks in a mildly distracting enviornment for 30 minutes with Min verbal cues for redirection. SLP Short Term Goal 4 (Week 2): Patient will self-monitor and correct errors as well as verbosity during functional tasks with Mod A verbal cues.  Skilled Therapeutic Interventions: Skilled treatment session focused on cognitive goals. SLP facilitated session by providing Min A verbal cues for patient to self-monitor and correct errors during a complex visual scanning/attention PEG task. The majority of the patient's errors were due to patient having difficulty differentiating between the red and orange pegs. Patient demonstrated selective attention to task in a moderately distracting environment for ~45 minutes with Mod I. Patient left upright in wheelchair with alarm on and all needs within reach. Continue with current plan of care.      Pain No/Denies Pain   Therapy/Group: Individual Therapy  Laith Antonelli 07/18/2019, 2:38 PM

## 2019-07-18 NOTE — Progress Notes (Signed)
Occupational Therapy Session Note  Patient Details  Name: Lonnie [name] MRN: [id number] Date of Birth: 01/31/1951  Today's Date: 07/18/2019 OT Individual Time: 0700-0815 OT Individual Time Calculation (min): 75 min    Short Term Goals: Week 2:  OT Short Term Goal 1 (Week 2): STG=LTG due to ELOS  Skilled Therapeutic Interventions/Progress Updates:    Pt on toilet upon arrival with NT present.  Pt completed toileting tasks and amb without AD back to sink to wash hands.  Pt returned to EOB to finish breakfast.  MD arrived for morning assessment.  Pt became slightly agitated when questioned by MD.  Pt easily redirected.  Pt amb to sink for bathing at sink.  Pt completed bathing tasks standing at sink except to sit to bathe feet. Pt competed dressing with sit<>stand for LB dressing tasks.  Pt completed grooming tasks standing at sink.  Lab personnel entered for blood draw and pt initially agitated but easily redirected. Pt remained in bed with all needs within reach and bed alarm activated.  OT intervention with focus on functional amb without AD, standing balance, BADL training, activity tolerance, and safety awareness.  Therapy Documentation Precautions:  Precautions Precautions: Fall Restrictions Weight Bearing Restrictions: No Pain:  Pt c/o L shoulder discomfort with [place]; MD aware and repositioned, emotional support   Therapy/Group: Individual Therapy  Leroy Libman 07/18/2019, 8:29 AM

## 2019-07-18 NOTE — Progress Notes (Signed)
Occupational Therapy Session Note  Patient Details  Name: Lonnie [name] MRN: [id number] Date of Birth: 01-21-1951  Today's Date: 07/18/2019 OT Individual Time: 1115-1155 OT Individual Time Calculation (min): 40 min    Short Term Goals: Week 2:  OT Short Term Goal 1 (Week 2): STG=LTG due to ELOS  Skilled Therapeutic Interventions/Progress Updates:    Pt resting in w/c upon arrival.  OT intervention with focus on functional amb with RW, simulated walk-in shower transfers, standing balance, discharge planning, and safety awareness to increase independence with BADLs. Pt amb with RW to gym, ADL apartment, and tub room with supervision.  No unsafe behaviors observed.  Pt practiced stepping into shower with supervision.  Discussed use of grab bars at home and seat for shower.  Pt cooperative and receptive to all recommendations.  Pt returned to room and remained seated in w/c with all needs within reach and belt alarm activated.   Therapy Documentation Precautions:  Precautions Precautions: Fall Restrictions Weight Bearing Restrictions: No  Pain: Pt c/o L shoulder discomfort; emotional support, pt declined heat   Therapy/Group: Individual Therapy  Leroy Libman 07/18/2019, 12:08 PM

## 2019-07-18 NOTE — Progress Notes (Signed)
Physical Therapy Session Note  Patient Details  Name: Lonnie [name] MRN: [id number] Date of Birth: Jul 31, 1951  Today's Date: 07/18/2019 PT Individual Time: 0915-1000 PT Individual Time Calculation (min): 45 min   Short Term Goals: Week 1:  PT Short Term Goal 1 (Week 1): STG=LTG due to short ELOS Week 2:  PT Short Term Goal 1 (Week 2): STG=LTG due to ELOS.  Skilled Therapeutic Interventions/Progress Updates:     Patient in bed upon PT arrival. Patient alert and agreeable to PT session.  He was argumentative at times with this PT, on a variety of subjects.   Bed Mobility: Patient performed supine to/from sit with supervision.   Transfers: Patient performed sit to/from stand with supervision. Provided verbal cues for safety, to wait for PT and for safe hand placement.    Therapeutic Activity: Pt pulled up pants and attached belt with CGA.  He donned shoes and socks sitting EOB with supervision. Reaching activity in sitting, to promote trunk shortening/lengthening; pt unable to continue due to L buttock pain, "sciatica, per pt."  Gait Training:  Patient ambulated 100' feet using RW with CGA. VCs for wider BOS and upright stance. Wheelchair Mobility:  PT provided pt with w/c cushion for comfort.  Neuromuscular Re-ed: Patient stood with forefeet on wedge with bil UE support on RW, x 3 minutes for sustained stretch bil hamstrings and heel cords.  R hamstring quite tight, remaining slightly flexed in standing.  Patient in at end of session with breaks locked; Loma Sousa, SLP entering room.    Therapy Documentation Precautions:  Precautions Precautions: Fall Restrictions Weight Bearing Restrictions: No  Pain: rated low back and L shoulder "16" despite PT explaining how scale.  Premedicated.  Pt became frustrated that staff continually ask him how much pain he is in, "but don't do enough about it."         Therapy/Group: Individual Therapy  Neven Fina 07/18/2019, 10:14 AM

## 2019-07-18 NOTE — Progress Notes (Signed)
Given in report that patient has been refusing in/out catheterizations & bladder scans. Discussed with patient & patient stated that he did not want to be cathed. Asked patient if he felt full or had an urge to urinate & he stated no. Nurse tech was instructed to go back to the patient & see if he could void some more. Was reported that he had 853ml of urine in bladder after voiding on previous shift. He could not. He again refused to be cathed. His bladder was scanned & he had 900 ml remaining. Was reported that physicians are aware of refusals & it has been documented for some time now. Results of not releasing the urine were discussed with patient but he was not complying. Patient's daughter was called & informed of refusals & stated that she would come in the morning to speak to him. During the shift so far, he has c/o pain to his back & left shoulder. He was given his scheduled & prn pain medication. The last time he called, he was just given his norco within an hour when he called & was not able to give him more. He was informed, but seemed not to be happy about it. When asked about his pain scale, he stated 16 both times & he was noted sitting on the side of the bed both times. No visible signs of discomfort noted. He was offered alternative pain relief (hot pack, cold pack), but he refused. Will continue to monitor & try to convince him to allow catheterizations. Communication left for the physician.

## 2019-07-19 ENCOUNTER — Inpatient Hospital Stay (HOSPITAL_COMMUNITY): Payer: Medicare HMO

## 2019-07-19 ENCOUNTER — Inpatient Hospital Stay (HOSPITAL_COMMUNITY): Payer: Medicare HMO | Admitting: Occupational Therapy

## 2019-07-19 ENCOUNTER — Inpatient Hospital Stay (HOSPITAL_COMMUNITY): Payer: Medicare HMO | Admitting: Speech Pathology

## 2019-07-19 DIAGNOSIS — I10 Essential (primary) hypertension: Secondary | ICD-10-CM

## 2019-07-19 DIAGNOSIS — S069X0S Unspecified intracranial injury without loss of consciousness, sequela: Secondary | ICD-10-CM

## 2019-07-19 LAB — BASIC METABOLIC PANEL
Anion gap: 12 (ref 5–15)
BUN: 18 mg/dL (ref 8–23)
CO2: 23 mmol/L (ref 22–32)
Calcium: 9.3 mg/dL (ref 8.9–10.3)
Chloride: 103 mmol/L (ref 98–111)
Creatinine, Ser: 0.94 mg/dL (ref 0.61–1.24)
GFR calc Af Amer: >60 mL/min (ref >60)
GFR calc non Af Amer: >60 mL/min (ref >60)
Glucose, Bld: 101 mg/dL — ABNORMAL HIGH (ref 70–99)
Potassium: 4.2 mmol/L (ref 3.5–5.1)
Sodium: 138 mmol/L (ref 135–145)

## 2019-07-19 MED ORDER — CLONAZEPAM 0.5 MG PO TABS
0.5000 mg | ORAL_TABLET | Freq: Two times a day (BID) | ORAL | Status: DC
  Administered 2019-07-19 – 2019-07-20 (×3): 0.5 mg via ORAL
  Filled 2019-07-19 (×3): qty 1

## 2019-07-19 NOTE — Progress Notes (Signed)
Patient is still refusing catheterizations & has urine volumes over 800 ml in his bladder. Last night, when this nurse went in to give the patient his medication, he began a conversation about his life. He was asleep but awoke easily be verbal stimuli. He took his medication without difficulty. His conversation was very sad & accusatory of his wife for bringing him here. He staed that this was the worst place he could be & called it a prison. He also talk about his former occupations, his accident & was really verbal when he talked about his wife who he believes  "is trying to take everything from me". When not agreed with, he gets very argumentative. He became angry when talking about the doctor, saying that he told him that there was nothing wrong with his left shoulder & back, which he says gives him excruciating pain. Twice he became tearful & was discouraged. This morning he seems a little brighter, but he refused his labs. Oncoming nurse was informed & communication left for the physician

## 2019-07-19 NOTE — Progress Notes (Signed)
Occupational Therapy Session Note  Patient Details  Name: Lonnie [name] MRN: [id number] Date of Birth: 1951-11-30  Today's Date: 07/19/2019 OT Individual Time: 0700-0810 OT Individual Time Calculation (min): 70 min    Short Term Goals: Week 2:  OT Short Term Goal 1 (Week 2): STG=LTG due to ELOS  Skilled Therapeutic Interventions/Progress Updates:    OT intervention with focus on functional amb without AD, toileting, bathing at shower level, dressing with sit<>stand from w/c,grooming tasks while standing at sink, and safety awareness to increase independence with BADLs.  Pt declined use of RW this morning but required CGA when amb without AD.  Pt completed all bathing/dressing tasks at supervision level.  MD entered room and pt refused to engage with him, stating that he was taking a shower and would be unable to converse with him.  Pt requires min verbal cues for safety awareness.  Pt remained in w/c with all needs within reach and belt alarm activated.  Therapy Documentation Precautions:  Precautions Precautions: Fall Restrictions Weight Bearing Restrictions: No Pain:   Pt with no c/o of pain this morning   Therapy/Group: Individual Therapy  Leroy Libman 07/19/2019, 8:07 AM

## 2019-07-19 NOTE — Progress Notes (Signed)
Occupational Therapy Session Note  Patient Details  Name: Lonnie [name] MRN: [id number] Date of Birth: [birth year]  Today's Date: 07/19/2019 OT Individual Time: 1425-1510 OT Individual Time Calculation (min): 45 min    Short Term Goals: Week 1:  OT Short Term Goal 1 (Week 1): STG= LTG d/t ELOS OT Short Term Goal 1 - Progress (Week 1): Progressing toward goal Week 2:  OT Short Term Goal 1 (Week 2): STG=LTG due to ELOS  Skilled Therapeutic Interventions/Progress Updates:    1:1 Pt was on the toilet when arrived with NT. Pt performed toileting with close supervision sit to stands. Pt given RW and ambulated to the sink to wash hands. After he finished pt had a posterior LOB requiring min A to correct. Pt ambulated to the apartment with flexed trunk posture. Walker adjusted slightly higher to promote more upright posture. Discussed d/c planning and his plan v having supervision with all mobility/ ADLs for safety. Recommending a walker lap tray for the RW- will provide a handout on a sample pt/ family could purchase.  Pt with fatigue this pm and right foot dragging with increased fatigue. Pt doffed LB clothing and got into bed with extra time with supervision.   Therapy Documentation Precautions:  Precautions Precautions: Fall Restrictions Weight Bearing Restrictions: No Pain: Pain Assessment Pain Scale: 0-10 Pain Score: 0-No pain   Therapy/Group: Individual Therapy  Willeen Cass [facility] 07/19/2019, 3:16 PM

## 2019-07-19 NOTE — Progress Notes (Signed)
Physical Therapy Session Note  Patient Details  Name: Lonnie [name] MRN: [id number] Date of Birth: 01/22/1951  Today's Date: 07/19/2019 PT Individual Time: 0915-1000 PT Individual Time Calculation (min): 45 min   Short Term Goals: Week 1:  PT Short Term Goal 1 (Week 1): STG=LTG due to short ELOS Week 2:  PT Short Term Goal 1 (Week 2): STG=LTG due to ELOS.  Skilled Therapeutic Interventions/Progress Updates:     Patient in w/c with LEs elevated on the bed upon PT arrival. Patient alert and agreeable to PT session. Patient expressed concern about low back pain, but reported that he had decreased pain today of 4/10. PT provided repositioning, distraction, and rest breaks for pain interventions throughout session.   Therapeutic Activity: Transfers: Patient performed sit to/from stand x8 with supervision with and without a RW and with and without UE support. Provided verbal cues for using UEs to lower himself down when fatigued to prevent decreased control with the descent.  Gait Training:  Patient ambulated 100 feet x1 using the RW with supervision and 200 feet x2 using no AD with CGA-min A for balance. Ambulated with forward trunk lean and decreased R step height with RW and with decreased trunk rotation and arm swing with intermittent variable foot placement or scissoring gait and decreased R foot clearance with fatigue without AD, min A provided x5 due to lateral LOB to the R. Provided verbal cues for erect posture, increased step height on R, and increased gait speed and arm swing without AD. PT asked patient if he could continue walking after 200 feet without an AD, patient responded that he needed a rest break because he would not make it all the way back. PT agreed and educated about energy conservation and planing out rest breaks throughout the day and when walking at home and in the community.   Therapeutic Exercise: Patient performed the following exercises with verbal and tactile  cues for proper technique. -Repeated lumbar extension in standing without UE support with CGA 2x10. Provided cues for continuous movement, as patient would stay in lumbar extension and hold for a few seconds before returning to erect posture initially. Low back pain reduced to 3.5/10 after first set and 2-3/10 after second set. Educated patient on performing exercise for management of low back pain. Recommended progression to prone press up when his L shoulder pain resolves.   Patient in recliner at end of session with breaks locked, seat belt alarm set, and all needs within reach.    Therapy Documentation Precautions:  Precautions Precautions: Fall Restrictions Weight Bearing Restrictions: No    Therapy/Group: Individual Therapy  Zhanna Melin L Deshana Rominger PT, DPT  07/19/2019, 3:24 PM

## 2019-07-19 NOTE — Progress Notes (Signed)
[facility] PHYSICAL MEDICINE & REHABILITATION PROGRESS NOTE   Subjective/Complaints: Patient seen working with therapy this morning.  He states he is not willing to answer any questions at present.  Discussed premorbid behavior with therapies.  ROS: Unwilling to answer questions this morning.  Objective:   No results found. Recent Labs    07/18/19 0807  WBC 10.2  HGB 14.5  HCT 46.0  PLT 363   No results for input(s): NA, K, CL, CO2, GLUCOSE, BUN, CREATININE, CALCIUM in the last 72 hours.  Intake/Output Summary (Last 24 hours) at 07/19/2019 0902 Last data filed at 07/19/2019 0603 Gross per 24 hour  Intake 240 ml  Output 3475 ml  Net -3235 ml     Physical Exam: Vital Signs Blood pressure (!) 139/122, pulse (!) 56, temperature 98 F (36.7 C), resp. rate 18, height 6\' 2"  (1.88 m), weight 75.2 kg, SpO2 100 %. Constitutional: No distress . Vital signs reviewed. HENT: Normocephalic.  Atraumatic. Eyes: EOMI.  No discharge. Cardiovascular: No JVD. Respiratory: Normal effort. GI: Non-distended. Musc: No edema or tenderness in extremities. Neuro: Alert Really moving all extremities Skin: Left scalp staples C/D/I Psych: Agitated.  Assessment/Plan: 1. Functional deficits secondary to L SDH  which require 3+ hours per day of interdisciplinary therapy in a comprehensive inpatient rehab setting.  Physiatrist is providing close team supervision and 24 hour management of active medical problems listed below.  Physiatrist and rehab team continue to assess barriers to discharge/monitor patient progress toward functional and medical goals  Care Tool:  Bathing    Body parts bathed by patient: Right arm, Left arm, Chest, Abdomen, Front perineal area, Buttocks, Right upper leg, Left upper leg, Right lower leg, Left lower leg, Face   Body parts bathed by helper: Right lower leg, Left lower leg     Bathing assist Assist Level: Supervision/Verbal cueing     Upper Body  Dressing/Undressing Upper body dressing   What is the patient wearing?: Pull over shirt    Upper body assist Assist Level: Set up assist    Lower Body Dressing/Undressing Lower body dressing      What is the patient wearing?: Underwear/pull up, Pants     Lower body assist Assist for lower body dressing: Supervision/Verbal cueing     Toileting Toileting    Toileting assist Assist for toileting: Supervision/Verbal cueing     Transfers Chair/bed transfer  Transfers assist     Chair/bed transfer assist level: Contact Guard/Touching assist Chair/bed transfer assistive device: Programmer, multimedia   Ambulation assist      Assist level: Contact Guard/Touching assist Assistive device: Walker-rolling Max distance: 150   Walk 10 feet activity   Assist     Assist level: Contact Guard/Touching assist Assistive device: Walker-rolling   Walk 50 feet activity   Assist Walk 50 feet with 2 turns activity did not occur: Safety/medical concerns(limited by pain (headache))  Assist level: Contact Guard/Touching assist Assistive device: Walker-rolling    Walk 150 feet activity   Assist Walk 150 feet activity did not occur: Safety/medical concerns(limited by pain (headache))  Assist level: Supervision/Verbal cueing Assistive device: Walker-rolling    Walk 10 feet on uneven surface  activity   Assist Walk 10 feet on uneven surfaces activity did not occur: Safety/medical concerns(limited by pain (headache))         Wheelchair     Assist Will patient use wheelchair at discharge?: No   Wheelchair activity did not occur: N/A(Patient is ambulatory)  Wheelchair 50 feet with 2 turns activity    Assist    Wheelchair 50 feet with 2 turns activity did not occur: N/A(Patient is ambulatory)       Wheelchair 150 feet activity     Assist Wheelchair 150 feet activity did not occur: N/A(Patient is ambulatory)           Medical Problem List and Plan: 1.Functional deficits and right hemiparesissecondary to left fronto-parietal SDH/TBI Continue CIR  Repeat CT head on 7/20 personally reviewed- stable/improving, empty sella noted  Seroquel increased to 50 twice daily on 7/27 2. Antithrombotics: -DVT/anticoagulation:Mechanical:Sequential compression devices, below kneeBilateral lower extremities -antiplatelet therapy:  3. Pain Management:tylenol prn.  Appears relatively controlled on 7/28 with meds 4. Mood:LCSW to follow for evaluation and support. 5. Neuropsych: This patientis not capable of making decisions on hisown behalf.    The patient had likely early onset frontotemporal dementia according to neuropsych which has been exacerbated by the TBI  Will consider Propranolol if HR allows  Will consider antidepressant  Trial Klonopin on 7/28 6. Skin/Wound Care:Routine pressure relief measures. 7. Fluids/Electrolytes/Nutrition:Monitor I/Os 8. HTN: Monitor BP   ContinueAvapro and metoprolol.  Labile on 7/28, with extremely elevated diastolic recording 9. CAD s/p CABG:Continue Lipitor. 10. Bradycardia: --monitor for symptoms. Will set parameters on BB. Vitals:   07/18/19 1940 07/19/19 0602  BP: (!) 147/82 (!) 139/122  Pulse: 69 (!) 56  Resp: 18 18  Temp: 98.4 F (36.9 C) 98 F (36.7 C)  SpO2: 100% 100%   Labile on 7/28 11.Tobacco use: On nicotine patch. 12. Constipation:   Bowel meds available 13.  UTI   Bactrim switch to cipro 250mg  BID for Pseudomonas on 7/24  CBC within normal limits on 7/27 14.  Urinary retention  Multifactorial- continues to refuse caths   Patient refusing BMP -patient at risk for severe AKI due to significant bladder volumes with refusal of cath  LOS: 11 days A FACE TO FACE EVALUATION WAS PERFORMED  Yizel Canby Karis JubaAnil Isley Weisheit 07/19/2019, 9:02 AM

## 2019-07-19 NOTE — Progress Notes (Signed)
Speech Language Pathology Daily Session Note  Patient Details  Name: Lonnie [name] MRN: [id number] Date of Birth: 28-Feb-1951  Today's Date: 07/19/2019 SLP Individual Time: 1300-1325 SLP Individual Time Calculation (min): 25 min  Short Term Goals: Week 2: SLP Short Term Goal 1 (Week 2): Patient will demonstrate functional problem solving for basic and familiar tasks with Min A verbal cues. SLP Short Term Goal 2 (Week 2): Patient will recall daily information with use of memory compensatory strategies with Mod A verbal cues. SLP Short Term Goal 3 (Week 2): Patient will demosntrate selective attention to functional tasks in a mildly distracting enviornment for 30 minutes with Min verbal cues for redirection. SLP Short Term Goal 4 (Week 2): Patient will self-monitor and correct errors as well as verbosity during functional tasks with Mod A verbal cues.  Skilled Therapeutic Interventions: Skilled treatment session focused on cognitive goals. SLP facilitated session with a functional conversation that focused on anticipatory awareness and d/c planning. Patient was consuming his lunch and alternated between self-feeding and functional conversation with supervision level verbal cues. Patient demonstrates increased awareness in regards to deficits but continues to require Min verbal cues to verbalize how deficits will impact function at home. Patient left upright in bed with alarm on and all needs within reach. Continue with current plan of care.      Pain Pain Assessment Pain Scale: 0-10 Pain Score: 0-No pain  Therapy/Group: Individual Therapy  Jenny Omdahl 07/19/2019, 2:34 PM

## 2019-07-19 NOTE — Progress Notes (Signed)
Occupational Therapy Session Note  Patient Details  Name: Lonnie [name] MRN: [id number] Date of Birth: July 22, 1951  Today's Date: 07/19/2019 OT Individual Time: 1030-1100 OT Individual Time Calculation (min): 30 min    Short Term Goals: Week 2:  OT Short Term Goal 1 (Week 2): STG=LTG due to ELOS  Skilled Therapeutic Interventions/Progress Updates:    Patient seated edge of bed following blood draw.  He is pleasant and cooperative.  Talkative t/o session.  Able to don/doff socks mod I, tolerated 15 minutes of static standing CS without balance of endurance deficit.  Bed  mobility with CS.  He is in bed at close of session with bed alarm set and call bell in reach.    Therapy Documentation Precautions:  Precautions Precautions: Fall Restrictions Weight Bearing Restrictions: No General:   Vital Signs:  Pain: Pain Assessment Pain Scale: 0-10 Pain Score: 0-No pain Other Treatments:     Therapy/Group: Individual Therapy  Carlos Levering 07/19/2019, 12:23 PM

## 2019-07-20 ENCOUNTER — Encounter (HOSPITAL_COMMUNITY): Payer: Medicare HMO | Admitting: Psychology

## 2019-07-20 ENCOUNTER — Inpatient Hospital Stay (HOSPITAL_COMMUNITY): Payer: Medicare HMO

## 2019-07-20 ENCOUNTER — Inpatient Hospital Stay (HOSPITAL_COMMUNITY): Payer: Medicare HMO | Admitting: Speech Pathology

## 2019-07-20 DIAGNOSIS — M25512 Pain in left shoulder: Secondary | ICD-10-CM

## 2019-07-20 MED ORDER — CLONAZEPAM 0.5 MG PO TABS: 0.2500 mg | ORAL_TABLET | Freq: Two times a day (BID) | ORAL | Status: DC

## 2019-07-20 MED ORDER — CLONAZEPAM 0.25 MG PO TBDP
0.2500 mg | ORAL_TABLET | Freq: Two times a day (BID) | ORAL | Status: DC
  Administered 2019-07-20 – 2019-07-23 (×6): 0.25 mg via ORAL
  Filled 2019-07-20 (×6): qty 1

## 2019-07-20 NOTE — Plan of Care (Signed)
  Problem: Consults Goal: RH GENERAL PATIENT EDUCATION Description: See Patient Education module for education specifics. Outcome: Progressing   Problem: RH BOWEL ELIMINATION Goal: RH STG MANAGE BOWEL WITH ASSISTANCE Description: STG Manage Bowel with min Assistance. Outcome: Progressing Flowsheets (Taken 07/20/2019 1301) STG: Pt will manage bowels with assistance: 6-Modified independent Goal: RH STG MANAGE BOWEL W/MEDICATION W/ASSISTANCE Description: STG Manage Bowel with Medication with mod I Assistance. Outcome: Progressing Flowsheets (Taken 07/20/2019 1301) STG: Pt will manage bowels with medication with assistance: 6-Modified independent   Problem: RH PAIN MANAGEMENT Goal: RH STG PAIN MANAGED AT OR BELOW PT'S PAIN GOAL Description: Less than 4 on 0-10 scale Outcome: Progressing   Problem: RH KNOWLEDGE DEFICIT GENERAL Goal: RH STG INCREASE KNOWLEDGE OF SELF CARE AFTER HOSPITALIZATION Description: Pt able to demonstrate medication compliance and safety precautions prior to discharge with min assist Outcome: Progressing   Problem: RH BLADDER ELIMINATION Goal: RH STG MANAGE BLADDER WITH ASSISTANCE Description: STG Manage Bladder With min Assistance Outcome: Progressing Flowsheets (Taken 07/20/2019 1301) STG: Pt will manage bladder with assistance: 5-Supervision/set up Goal: RH STG MANAGE BLADDER WITH MEDICATION WITH ASSISTANCE Description: STG Manage Bladder With Medication With mod I Assistance. Outcome: Progressing Flowsheets (Taken 07/20/2019 1301) STG: Pt will manage bladder with medication with assistance: 5-Supervision/set up Goal: RH STG MANAGE BLADDER WITH EQUIPMENT WITH ASSISTANCE Description: STG Manage Bladder With Equipment With mod Assistance Outcome: Progressing
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
## 2019-07-20 NOTE — Progress Notes (Signed)
Speech Language Pathology Daily Session Note  Patient Details  Name: Lonnie [name] MRN: [id number] Date of Birth: [birth year]  Today's Date: 07/20/2019 SLP Individual Time: 1300-1340 SLP Individual Time Calculation (min): 40 min  Short Term Goals: Week 2: SLP Short Term Goal 1 (Week 2): Patient will demonstrate functional problem solving for basic and familiar tasks with Min A verbal cues. SLP Short Term Goal 2 (Week 2): Patient will recall daily information with use of memory compensatory strategies with Mod A verbal cues. SLP Short Term Goal 3 (Week 2): Patient will demosntrate selective attention to functional tasks in a mildly distracting enviornment for 30 minutes with Min verbal cues for redirection. SLP Short Term Goal 4 (Week 2): Patient will self-monitor and correct errors as well as verbosity during functional tasks with Mod A verbal cues.  Skilled Therapeutic Interventions: Skilled treatment session focused on cognitive goals. Upon arrival, patient was asleep and appeared lethargic. Patient's speech was slow and deliberate this session with mild phonemic paraphasis noted. Patient reported it was a "down day" and reported he was "depressed" due to finding out a close family member passed away today via telephone conversation with wife (confirmed with wife). Patient initiated re-administering the Cognistat in which patient scored [facility] for attention, orientation, judgement and naming but testing was unable to be completed due to lethargy. Therefore, patient missed remaining 20 minutes of session. Patient left with alarm on and all needs within reach. Continue with current plan of care.      Pain No/Denies Pain    Therapy/Group: Individual Therapy  Trishelle Devora 07/20/2019, 2:53 PM

## 2019-07-20 NOTE — Progress Notes (Signed)
[place] PHYSICAL MEDICINE & REHABILITATION PROGRESS NOTE   Subjective/Complaints: Patient seen sitting up at the edge of his bed eating breakfast this morning.  Good sitting balance noted.  He states he wants his back and shoulder manipulated by chiropractor.  Discussed with patient that unfortunately, this was not possible in the hospital.  Patient became agitated.  Per nursing, good benefit with Klonopin.  ROS: Denies CP, SOB, N/V/D  Objective:   No results found. Recent Labs    07/18/19 0807  WBC 10.2  HGB 14.5  HCT 46.0  PLT 363   Recent Labs    07/19/19 1033  NA 138  K 4.2  CL 103  CO2 23  GLUCOSE 101*  BUN 18  CREATININE 0.94  CALCIUM 9.3    Intake/Output Summary (Last 24 hours) at 07/20/2019 0851 Last data filed at 07/20/2019 0729 Gross per 24 hour  Intake 480 ml  Output 1725 ml  Net -1245 ml     Physical Exam: Vital Signs Blood pressure (!) 144/74, pulse (!) 54, temperature 97.6 F (36.4 C), temperature source Oral, resp. rate 16, height 6\' 2"  (1.88 m), weight 75.2 kg, SpO2 100 %. Constitutional: NAD. Vital signs reviewed. HENT: Normocephalic.  Atraumatic. Eyes: EOMI.  No discharge. Cardiovascular: No JVD. Respiratory: Normal effort. GI: Non-distended. Musc: No edema or tenderness in extremities. Neuro: Alert Grossly 5/5 throughout Skin: Left scalp staples C/D/I Psych: Easily agitated.  Assessment/Plan: 1. Functional deficits secondary to L SDH  which require 3+ hours per day of interdisciplinary therapy in a comprehensive inpatient rehab setting.  Physiatrist is providing close team supervision and 24 hour management of active medical problems listed below.  Physiatrist and rehab team continue to assess barriers to discharge/monitor patient progress toward functional and medical goals  Care Tool:  Bathing    Body parts bathed by patient: Right arm, Left arm, Chest, Abdomen, Front perineal area, Buttocks, Right upper leg, Left upper leg,  Right lower leg, Left lower leg, Face   Body parts bathed by helper: Right lower leg, Left lower leg     Bathing assist Assist Level: Supervision/Verbal cueing     Upper Body Dressing/Undressing Upper body dressing   What is the patient wearing?: Pull over shirt    Upper body assist Assist Level: Independent    Lower Body Dressing/Undressing Lower body dressing      What is the patient wearing?: Underwear/pull up, Pants     Lower body assist Assist for lower body dressing: Supervision/Verbal cueing     Toileting Toileting    Toileting assist Assist for toileting: Supervision/Verbal cueing     Transfers Chair/bed transfer  Transfers assist     Chair/bed transfer assist level: Contact Guard/Touching assist Chair/bed transfer assistive device: Geologist, engineeringWalker   Locomotion Ambulation   Ambulation assist      Assist level: Minimal Assistance - Patient > 75% Assistive device: No Device Max distance: 200'   Walk 10 feet activity   Assist     Assist level: Contact Guard/Touching assist Assistive device: No Device   Walk 50 feet activity   Assist Walk 50 feet with 2 turns activity did not occur: Safety/medical concerns(limited by pain (headache))  Assist level: Minimal Assistance - Patient > 75% Assistive device: No Device    Walk 150 feet activity   Assist Walk 150 feet activity did not occur: Safety/medical concerns(limited by pain (headache))  Assist level: Minimal Assistance - Patient > 75% Assistive device: No Device    Walk 10 feet on uneven  surface  activity   Assist Walk 10 feet on uneven surfaces activity did not occur: Safety/medical concerns(limited by pain (headache))         Wheelchair     Assist Will patient use wheelchair at discharge?: No   Wheelchair activity did not occur: N/A(Patient is ambulatory)         Wheelchair 50 feet with 2 turns activity    Assist    Wheelchair 50 feet with 2 turns activity did not  occur: N/A(Patient is ambulatory)       Wheelchair 150 feet activity     Assist Wheelchair 150 feet activity did not occur: N/A(Patient is ambulatory)          Medical Problem List and Plan: 1.Functional deficits and right hemiparesissecondary to left fronto-parietal SDH/TBI Continue CIR  Repeat CT head on 7/20 personally reviewed- stable/improving, empty sella noted  Seroquel increased to 50 twice daily on 7/27  DC staples  Team conference today to discuss current and goals and coordination of care, home and environmental barriers, and discharge planning with nursing, case manager, and therapies.  2. Antithrombotics: -DVT/anticoagulation:Mechanical:Sequential compression devices, below kneeBilateral lower extremities -antiplatelet therapy:  3. Pain Management:tylenol prn.  Patient states he does not want to take any medications and wants chiropractic manipulation for his back and shoulder 4. Mood:LCSW to follow for evaluation and support. 5. Neuropsych: This patientis not capable of making decisions on hisown behalf.    The patient had likely early onset frontotemporal dementia according to neuropsych which has been exacerbated by the TBI  Will consider Propranolol if HR allows  Will consider antidepressant  Trial Klonopin on 7/28, appears to have benefit 6. Skin/Wound Care:Routine pressure relief measures. 7. Fluids/Electrolytes/Nutrition:Monitor I/Os 8. HTN: Monitor BP   ContinueAvapro and metoprolol.  Labile on 7/29 9. CAD s/p CABG:Continue Lipitor. 10. Bradycardia: --monitor for symptoms. Will set parameters on BB. Vitals:   07/19/19 1931 07/20/19 0454  BP: 123/70 (!) 144/74  Pulse: 64 (!) 54  Resp: 16 16  Temp: 97.6 F (36.4 C) 97.6 F (36.4 C)  SpO2: 100% 100%   Labile on 7/29 11.Tobacco use: On nicotine patch. 12. Constipation:   Bowel meds available 13.  UTI   Bactrim switch to cipro 250mg  BID for  Pseudomonas on 7/24  CBC within normal limits on 7/27 14.  Urinary retention  Multifactorial- continues to refuse caths   BMP within acceptable range on 7/28  LOS: 12 days A FACE TO FACE EVALUATION WAS PERFORMED  Ankit Lorie Phenix 07/20/2019, 8:51 AM

## 2019-07-20 NOTE — Progress Notes (Addendum)
Physical Therapy Session Note  Patient Details  Name: Lonnie [name] MRN: [id number] Date of Birth: January 07, 1951  Today's Date: 07/20/2019 PT Individual Time: 1517-6160 PT Individual Time Calculation (min): 66 min   Short Term Goals: Week 1:  PT Short Term Goal 1 (Week 1): STG=LTG due to short ELOS Week 2:  PT Short Term Goal 1 (Week 2): STG=LTG due to ELOS.  Skilled Therapeutic Interventions/Progress Updates:     Patient in wc upon PT arrival. Patient dozing, but agreeable to PT session. Pt reported that his low back has hurt since 1987.  He saw a chiropractor regularly, and believes the problem was a disc.  Pt has had a painful L 1st met head callus; he feels this affects his gait and that is why he tends to compensate iwht R knee flexion throughout gaiit; he has a hx of ruptured L patellar tendon, s/p surgical repair, may years ago.  PT educated pt on his long hx of compensations, and recommended that pt see a podiatrist after d/c for callus mgt and possibly custom orthotic.  Pt falling asleep throughout session when not active.  Sherol Dade, RN stated that pt started on Clonopin last night.   Mat Mobility: Patient performed supine to/from sit with su0pervison. Provided verbal cues for safe hand placement. Transfers: Patient performed stand pivot x 3 with supervision.  Gait Training:   Wheelchair Mobility:  PT provided pt with 18 x 20 chair to better fit his habitus.  Pt more comfortable in w/c.  He locked and unlocked brakes with cues.  Neuromuscular Re-ed: Patient performed 10 x 2 standing back extensions for back pain, with immediate relief, but pain still rated 3/10.   Gait training without AD: x150' feet with min> mod assist.  Pt overshifts to L often, and has minimal clearance R foot .  r knee remains flexed in all phases of gait .  Standing with bil UE support, L/R foot taps onto 3" high block to improve bil hip flexion.  Pt successful 8/15 taps L, 5/15 taps R.    Therapeutic  Exercise: Patient performed the following exercises with verbal and tactile cues for proper technique: seated: 15 x 1 bil hip adductor squeezes focusing on core activation.  Patient in wc at end of session with brakes locked, seat belt alarm set, and all needs within reach.   Therapy Documentation Precautions:  Precautions Precautions: Fall Restrictions Weight Bearing Restrictions: No  Pain:3/10 LBP; exercises and change in w/c         Therapy/Group: Individual Therapy  Yvonda Fouty 07/20/2019, 10:34 AM

## 2019-07-20 NOTE — Progress Notes (Signed)
Occupational Therapy Session Note  Patient Details  Name: Lonnie [name] MRN: [id number] Date of Birth: December 20, 1951  Today's Date: 07/20/2019 OT Individual Time: 0700-0810 OT Individual Time Calculation (min): 70 min    Short Term Goals: Week 2:  OT Short Term Goal 1 (Week 2): STG=LTG due to ELOS  Skilled Therapeutic Interventions/Progress Updates:    Pt asleep in bed upon arrival but easily aroused.  Pt engaged in bathing/dressing tasks after eating breakfast.  Encourage pt to use RW to walk to sink this morning but pt insistent on walking without AD.  Pt amb with forward flexion and rounded shoulders.  Pt completed bathing/dressing tasks with sit<>stand from w/c.  Pt all bathing tasks while standing except to bathe feet.  Pt doffed and donned underwear in standing despite recommendation from this therapist to sit in w/c when threading underpants.  Pt did sit back into chair to thread pants.  Pt stood at sink to brush teeth.  Pt requires more than a reasonable amount of time to complete tasks and is very deliberate in all actions.  Discussed discharge plans.  Pt given handout on RW tray as recommended in previous days therapy session.  Recommendation is for 24/7 supervision for pt safety.  Unsure if wife will be able to provide this level of supervision.  Pt will be living in basement apartment alone with wife living upstairs. Pt remained seated in w/c with all needs within reach and belt alarm activated.   Therapy Documentation Precautions:  Precautions Precautions: Fall Restrictions Weight Bearing Restrictions: No   Pain:  Pt with no c/o pain this morning   Therapy/Group: Individual Therapy  Leroy Libman 07/20/2019, 8:12 AM

## 2019-07-21 ENCOUNTER — Inpatient Hospital Stay (HOSPITAL_COMMUNITY): Payer: Medicare HMO

## 2019-07-21 ENCOUNTER — Encounter (HOSPITAL_COMMUNITY): Payer: Medicare HMO | Admitting: Speech Pathology

## 2019-07-21 ENCOUNTER — Encounter (HOSPITAL_COMMUNITY): Payer: Medicare HMO

## 2019-07-21 ENCOUNTER — Ambulatory Visit (HOSPITAL_COMMUNITY): Payer: Medicare HMO

## 2019-07-21 DIAGNOSIS — R0989 Other specified symptoms and signs involving the circulatory and respiratory systems: Secondary | ICD-10-CM

## 2019-07-21 DIAGNOSIS — R001 Bradycardia, unspecified: Secondary | ICD-10-CM

## 2019-07-21 NOTE — Progress Notes (Signed)
Occupational Therapy Session Note  Patient Details  Name: Lonnie [name] MRN: [id number] Date of Birth: 1951-06-21  Today's Date: 07/21/2019 OT Individual Time: 0700-0730 OT Individual Time Calculation (min): 30 min    Short Term Goals: Week 2:  OT Short Term Goal 1 (Week 2): STG=LTG due to ELOS  Skilled Therapeutic Interventions/Progress Updates:    Focus on amb without AD, standing balance, and dressing tasks.  Pt declined use of RW to amb to sink for dressing tasks.  Pt completed all dressing tasks with supervision.  MD entered room and pt became agitated when discussing chiropractor. Pt requested to return to bed at end of session.  Pt remained in bed with all needs within reach and bed alarm activated.   Therapy Documentation Precautions:  Precautions Precautions: Fall Restrictions Weight Bearing Restrictions: No  Pain: Pt denies pain  Therapy/Group: Individual Therapy  Leroy Libman 07/21/2019, 10:48 AM

## 2019-07-21 NOTE — Progress Notes (Signed)
Pt still refuses to be bladder scanned at this time.Pt educated on importance. Will continue to monitor urinary output.

## 2019-07-21 NOTE — Progress Notes (Signed)
Speech Language Pathology Daily Session Note  Patient Details  Name: Lonnie [name] MRN: [id number] Date of Birth: 1950-12-27  Today's Date: 07/21/2019 SLP Individual Time: 1400-1455 SLP Individual Time Calculation (min): 55 min  Short Term Goals: Week 2: SLP Short Term Goal 1 (Week 2): Patient will demonstrate functional problem solving for basic and familiar tasks with Min A verbal cues. SLP Short Term Goal 2 (Week 2): Patient will recall daily information with use of memory compensatory strategies with Mod A verbal cues. SLP Short Term Goal 3 (Week 2): Patient will demosntrate selective attention to functional tasks in a mildly distracting enviornment for 30 minutes with Min verbal cues for redirection. SLP Short Term Goal 4 (Week 2): Patient will self-monitor and correct errors as well as verbosity during functional tasks with Mod A verbal cues.  Skilled Therapeutic Interventions: Skilled treatment session focused on completion of family education with the patient's wife. SLP provided education to the patient and his wife in regards to his current cognitive functioning and strategies to utilize at home to maximize recall with use of compensatory strategies and overall safety with functional tasks, especially the importance of 24 hour supervision. Both the patient and his wife were agreeable to all recommendations and strategies presented by SLP. However, the patient's wife stood up in the middle of the session and reported she needed to leave and requested this SLP walk her to the elevators. As we left his room, the patient's wife became tearful and emotional and bent over reporting, "I can't do this." SLP provided support and additional information in regards to brain injury recovery. SLP also involved the CSW.  The CSW was left with the patient's wife and this clinician returned to the patient's room. Patient left upright in bed with alarm on and all needs within reach. Continue with current  plan of care.      Pain Pain Assessment Pain Scale: 0-10 Pain Score: 5  Pain Location: Shoulder Pain Orientation: Left Pain Descriptors / Indicators: Aching Pain Frequency: Constant Patients Stated Pain Goal: 3 Pain Intervention(s): Medication (See eMAR)  Therapy/Group: Individual Therapy  Lonnie [name] 07/21/2019, 2:59 PM

## 2019-07-21 NOTE — Progress Notes (Signed)
[place] PHYSICAL MEDICINE & REHABILITATION PROGRESS NOTE   Subjective/Complaints: Patient seen standing at the sink working with therapy this morning.  No reported issues overnight.  He asks for spinal manipulation again today.  ROS: Denies CP, SOB, N/V/D  Objective:   No results found. No results for input(s): WBC, HGB, HCT, PLT in the last 72 hours. Recent Labs    07/19/19 1033  NA 138  K 4.2  CL 103  CO2 23  GLUCOSE 101*  BUN 18  CREATININE 0.94  CALCIUM 9.3    Intake/Output Summary (Last 24 hours) at 07/21/2019 1228 Last data filed at 07/21/2019 1054 Gross per 24 hour  Intake 360 ml  Output 2075 ml  Net -1715 ml     Physical Exam: Vital Signs Blood pressure (!) 141/86, pulse (!) 54, temperature 98.2 F (36.8 C), temperature source Oral, resp. rate 16, height 6\' 2"  (1.88 m), weight 75.2 kg, SpO2 100 %. Constitutional: NAD. Vital signs reviewed. HENT: Normocephalic.  Atraumatic. Eyes: EOMI.  No discharge. Cardiovascular: No JVD. Respiratory: Normal effort. GI: Non-distended. Musc: No edema or tenderness in extremities. Neuro: Alert Grossly 5/5 throughout Skin: Left scalp C/D/I Psych: Easily agitated.  Assessment/Plan: 1. Functional deficits secondary to L SDH  which require 3+ hours per day of interdisciplinary therapy in a comprehensive inpatient rehab setting.  Physiatrist is providing close team supervision and 24 hour management of active medical problems listed below.  Physiatrist and rehab team continue to assess barriers to discharge/monitor patient progress toward functional and medical goals  Care Tool:  Bathing    Body parts bathed by patient: Right arm, Left arm, Chest, Abdomen, Front perineal area, Buttocks, Right upper leg, Left upper leg, Right lower leg, Left lower leg, Face   Body parts bathed by helper: Right lower leg, Left lower leg     Bathing assist Assist Level: Supervision/Verbal cueing     Upper Body  Dressing/Undressing Upper body dressing   What is the patient wearing?: Pull over shirt    Upper body assist Assist Level: Independent    Lower Body Dressing/Undressing Lower body dressing      What is the patient wearing?: Underwear/pull up, Pants     Lower body assist Assist for lower body dressing: Supervision/Verbal cueing     Toileting Toileting    Toileting assist Assist for toileting: Supervision/Verbal cueing     Transfers Chair/bed transfer  Transfers assist     Chair/bed transfer assist level: Supervision/Verbal cueing Chair/bed transfer assistive device: Armrests   Locomotion Ambulation   Ambulation assist      Assist level: Moderate Assistance - Patient 50 - 74% Assistive device: No Device Max distance: 150   Walk 10 feet activity   Assist     Assist level: Moderate Assistance - Patient - 50 - 74% Assistive device: No Device   Walk 50 feet activity   Assist Walk 50 feet with 2 turns activity did not occur: Safety/medical concerns(limited by pain (headache))  Assist level: Moderate Assistance - Patient - 50 - 74% Assistive device: No Device    Walk 150 feet activity   Assist Walk 150 feet activity did not occur: Safety/medical concerns(limited by pain (headache))  Assist level: Minimal Assistance - Patient > 75% Assistive device: No Device    Walk 10 feet on uneven surface  activity   Assist Walk 10 feet on uneven surfaces activity did not occur: Safety/medical concerns(limited by pain (headache))         Wheelchair  Assist Will patient use wheelchair at discharge?: No   Wheelchair activity did not occur: N/A(Patient is ambulatory)         Wheelchair 50 feet with 2 turns activity    Assist    Wheelchair 50 feet with 2 turns activity did not occur: N/A(Patient is ambulatory)       Wheelchair 150 feet activity     Assist Wheelchair 150 feet activity did not occur: N/A(Patient is ambulatory)           Medical Problem List and Plan: 1.Functional deficits and right hemiparesissecondary to left fronto-parietal SDH/TBI Continue CIR  Repeat CT head on 7/20 personally reviewed- stable/improving, empty sella noted  Seroquel increased to 50 twice daily on 7/27 2. Antithrombotics: -DVT/anticoagulation:Mechanical:Sequential compression devices, below kneeBilateral lower extremities -antiplatelet therapy:  3. Pain Management:tylenol prn.  Patient states he does not want to take any medications and wants chiropractic manipulation for his back and shoulder, persistent request 4. Mood:LCSW to follow for evaluation and support.  Discussed with neuropsych-appreciate Recs 5. Neuropsych: This patientis not capable of making decisions on hisown behalf.    The patient had likely early onset frontotemporal dementia according to neuropsych which has been exacerbated by the TBI  Will consider Propranolol if HR allows  Will consider antidepressant  Trial Klonopin on 7/28, with benefit, however because sedation, dose decreased on 7/30 6. Skin/Wound Care:Routine pressure relief measures. 7. Fluids/Electrolytes/Nutrition:Monitor I/Os 8. HTN: Monitor BP   ContinueAvapro and metoprolol.  Labile on 7/30 9. CAD s/p CABG:Continue Lipitor. 10. Bradycardia: --monitor for symptoms. Will set parameters on BB. Vitals:   07/20/19 1929 07/21/19 0517  BP: 128/76 (!) 141/86  Pulse: 61 (!) 54  Resp: 16 16  Temp: 98 F (36.7 C) 98.2 F (36.8 C)  SpO2: 100% 100%   Bradycardic on 7/30 11.Tobacco use: On nicotine patch. 12. Constipation:   Bowel meds available 13.  UTI   Bactrim switch to Cipro 250mg  BID for Pseudomonas on 7/24  CBC within normal limits on 7/27 14.  Urinary retention  Multifactorial- continues to refuse caths, volumes remain high  BMP within acceptable range on 7/28  LOS: 13 days A FACE TO FACE EVALUATION WAS PERFORMED  Tameia Rafferty  Karis JubaAnil Tnya Ades 07/21/2019, 12:28 PM

## 2019-07-21 NOTE — Progress Notes (Signed)
Physical Therapy Session Note  Patient Details  Name: KOLETON DUCHEMIN MRN: [id number] Date of Birth: 08/22/1951  Today's Date: 07/21/2019 PT Individual Time: 1500-1600 PT Individual Time Calculation (min): 60 min   Short Term Goals: Week 1:  PT Short Term Goal 1 (Week 1): STG=LTG due to short ELOS Week 2:  PT Short Term Goal 1 (Week 2): STG=LTG due to ELOS.  Skilled Therapeutic Interventions/Progress Updates:   Family ed with dtr [name].  She return-demonstrated safe guarding for simulated sedan transfer, gait on level tile, basic transfers, and HEP of [place] for fall prevention, iwht 2-3# ankle wts for long arc quad knee extension, otherwise no weights.  Other therapeutic ex to address long-standing LBP: standing back extensions x 10, and prone lying x 10 minutes as tolerated, QD.   With external perturbations in standing, pt demonstrated bil ankle and hip strategies, absent stepping strategies.  At end of session, pt resting in bed with needs at hand and bed alarm set.     Therapy Documentation Precautions:  Precautions Precautions: Fall Restrictions Weight Bearing Restrictions: No  Pain: Pain Assessment Pain Scale: 0-10 Pain Score: 4 Pain Location: Shoulder Pain Orientation: Left Pain Descriptors / Indicators: Aching Patients Stated Pain Goal: 4 Pain Intervention(s): Medication (See eMAR)       Therapy/Group: Individual Therapy  Kairee Kozma 07/21/2019, 4:39 PM

## 2019-07-21 NOTE — Progress Notes (Signed)
Occupational Therapy Session Note  Patient Details  Name: Lonnie [name] MRN: [id number] Date of Birth: [birth year]  Today's Date: 07/21/2019 OT Individual Time: 1300-1345 OT Individual Time Calculation (min): 45 min    Short Term Goals: Week 2:  OT Short Term Goal 1 (Week 2): STG=LTG due to ELOS  Skilled Therapeutic Interventions/Progress Updates:    OT intervention with focus on family education and discharge planning.  [name] present.  Informed by pt/wife that bathroom entry has 8" step.  Recommended that pt use BSC on discharge.  Pt agreeable after encouragement to avoid use of bathroom until HHOT/HHPT initiates services.  Pt amb with RW to ADL apartment and practiced shower transfers. Recommended that pt have 24 hour supervision.  Pt and wife verbalized understanding. Communication between wife and pt was very reserved. Pt returned to room and remained in recliner with wife present.   Therapy Documentation Precautions:  Precautions Precautions: Fall Restrictions Weight Bearing Restrictions: No Pain:   Therapy/Group: Individual Therapy  Leroy Libman 07/21/2019, 2:43 PM

## 2019-07-21 NOTE — Progress Notes (Signed)
Pt educated on the importance of being bladder scanned. Pt refuses to be bladder scanned at this time. Will continue to monitor urinary output.

## 2019-07-22 ENCOUNTER — Inpatient Hospital Stay (HOSPITAL_COMMUNITY): Payer: Medicare HMO

## 2019-07-22 ENCOUNTER — Inpatient Hospital Stay (HOSPITAL_COMMUNITY): Payer: Medicare HMO | Admitting: Speech Pathology

## 2019-07-22 MED ORDER — PANTOPRAZOLE SODIUM 40 MG PO TBEC: 40.0000 mg | DELAYED_RELEASE_TABLET | Freq: Every day | ORAL | 1 refills | Status: DC

## 2019-07-22 MED ORDER — LORATADINE 10 MG PO TABS: 10.0000 mg | ORAL_TABLET | Freq: Every day | ORAL | Status: DC

## 2019-07-22 MED ORDER — FLUTICASONE PROPIONATE 50 MCG/ACT NA SUSP: 2.0000 | Freq: Every day | NASAL | 2 refills | Status: DC

## 2019-07-22 MED ORDER — LIDOCAINE 5 % EX PTCH: 1.0000 | MEDICATED_PATCH | CUTANEOUS | 0 refills | Status: DC

## 2019-07-22 MED ORDER — QUETIAPINE FUMARATE 50 MG PO TABS: 50.0000 mg | ORAL_TABLET | Freq: Two times a day (BID) | ORAL | 1 refills | Status: DC

## 2019-07-22 MED ORDER — CLONAZEPAM 0.25 MG PO TBDP: 0.2500 mg | ORAL_TABLET | Freq: Two times a day (BID) | ORAL | 0 refills | Status: DC

## 2019-07-22 MED ORDER — NICOTINE 14 MG/24HR TD PT24: 14.0000 mg | MEDICATED_PATCH | Freq: Every day | TRANSDERMAL | 0 refills | Status: DC

## 2019-07-22 MED ORDER — ACETAMINOPHEN 325 MG PO TABS: 650.0000 mg | ORAL_TABLET | Freq: Three times a day (TID) | ORAL | Status: DC

## 2019-07-22 MED ORDER — TAMSULOSIN HCL 0.4 MG PO CAPS: 0.8000 mg | ORAL_CAPSULE | Freq: Every day | ORAL | 1 refills | Status: DC

## 2019-07-22 MED ORDER — METOPROLOL TARTRATE 25 MG PO TABS: 25.0000 mg | ORAL_TABLET | Freq: Two times a day (BID) | ORAL | 1 refills | Status: DC

## 2019-07-22 MED ORDER — GABAPENTIN 100 MG PO CAPS: 100.0000 mg | ORAL_CAPSULE | Freq: Two times a day (BID) | ORAL | 1 refills | Status: DC

## 2019-07-22 MED ORDER — IRBESARTAN 300 MG PO TABS: 300.0000 mg | ORAL_TABLET | Freq: Every day | ORAL | 1 refills | Status: DC

## 2019-07-22 MED ORDER — DOCUSATE SODIUM 100 MG PO CAPS: 100.0000 mg | ORAL_CAPSULE | Freq: Two times a day (BID) | ORAL | 0 refills | Status: DC

## 2019-07-22 NOTE — Progress Notes (Signed)
[place] PHYSICAL MEDICINE & REHABILITATION PROGRESS NOTE   Subjective/Complaints: Patient seen sitting up in his chair this morning.  No reported issues overnight.  He states he was doing well, "until you gave me bad news".  When informed patient did not have any bad news for him he states, "okay fine.".  Discussed mood with therapies.  He is looking forward to discharge tomorrow.  ROS: Denies CP, SOB, N/V/D  Objective:   No results found. No results for input(s): WBC, HGB, HCT, PLT in the last 72 hours. Recent Labs    07/19/19 1033  NA 138  K 4.2  CL 103  CO2 23  GLUCOSE 101*  BUN 18  CREATININE 0.94  CALCIUM 9.3    Intake/Output Summary (Last 24 hours) at 07/22/2019 1024 Last data filed at 07/22/2019 0820 Gross per 24 hour  Intake 840 ml  Output 1200 ml  Net -360 ml     Physical Exam: Vital Signs Blood pressure (!) 110/55, pulse 73, temperature 98.6 F (37 C), temperature source Oral, resp. rate 16, height 6\' 2"  (1.88 m), weight 75.2 kg, SpO2 99 %. Constitutional: NAD. Vital signs reviewed. HENT: Normocephalic.  Atraumatic. Eyes: EOMI.  No discharge. Cardiovascular: No JVD. Respiratory: Normal effort. GI: Non-distended. Musc: No edema or tenderness in extremities. Neuro: Alert Grossly: 5/5 throughout Skin: Left scalp C/D/I Psych: Easily agitated.  Assessment/Plan: 1. Functional deficits secondary to L SDH  which require 3+ hours per day of interdisciplinary therapy in a comprehensive inpatient rehab setting.  Physiatrist is providing close team supervision and 24 hour management of active medical problems listed below.  Physiatrist and rehab team continue to assess barriers to discharge/monitor patient progress toward functional and medical goals  Care Tool:  Bathing    Body parts bathed by patient: Right arm, Left arm, Chest, Abdomen, Front perineal area, Buttocks, Right upper leg, Left upper leg, Right lower leg, Left lower leg, Face   Body parts  bathed by helper: Right lower leg, Left lower leg     Bathing assist Assist Level: Supervision/Verbal cueing     Upper Body Dressing/Undressing Upper body dressing   What is the patient wearing?: Pull over shirt    Upper body assist Assist Level: Independent    Lower Body Dressing/Undressing Lower body dressing      What is the patient wearing?: Underwear/pull up, Pants     Lower body assist Assist for lower body dressing: Supervision/Verbal cueing     Toileting Toileting    Toileting assist Assist for toileting: Supervision/Verbal cueing     Transfers Chair/bed transfer  Transfers assist     Chair/bed transfer assist level: Supervision/Verbal cueing Chair/bed transfer assistive device: Walker, Clinical biochemist   Ambulation assist      Assist level: Supervision/Verbal cueing Assistive device: Walker-rolling Max distance: 150   Walk 10 feet activity   Assist     Assist level: Supervision/Verbal cueing Assistive device: Walker-rolling   Walk 50 feet activity   Assist Walk 50 feet with 2 turns activity did not occur: Safety/medical concerns(limited by pain (headache))  Assist level: Supervision/Verbal cueing Assistive device: Walker-rolling    Walk 150 feet activity   Assist Walk 150 feet activity did not occur: Safety/medical concerns(limited by pain (headache))  Assist level: Supervision/Verbal cueing Assistive device: Walker-rolling    Walk 10 feet on uneven surface  activity   Assist Walk 10 feet on uneven surfaces activity did not occur: Safety/medical concerns(limited by pain (headache))  Wheelchair     Assist Will patient use wheelchair at discharge?: No   Wheelchair activity did not occur: N/A(Patient is ambulatory)         Wheelchair 50 feet with 2 turns activity    Assist    Wheelchair 50 feet with 2 turns activity did not occur: N/A(Patient is ambulatory)       Wheelchair 150  feet activity     Assist Wheelchair 150 feet activity did not occur: N/A(Patient is ambulatory)          Medical Problem List and Plan: 1.Functional deficits and right hemiparesissecondary to left fronto-parietal SDH/TBI Continue CIR  Repeat CT head on 7/20 personally reviewed- stable/improving, empty sella noted  Seroquel increased to 50 twice daily on 7/27  Order placed to DC staples again  Plan for d/c tomorrow  Will see patient for transitional care management in 1-2 weeks post-discharge 2. Antithrombotics: -DVT/anticoagulation:Mechanical:Sequential compression devices, below kneeBilateral lower extremities -antiplatelet therapy:  3. Pain Management:tylenol prn.  Patient states he does not want to take any medications and wants chiropractic manipulation for his back and shoulder, persistent request 4. Mood:LCSW to follow for evaluation and support.  Discussed with neuropsych-appreciate Recs 5. Neuropsych: This patientis not capable of making decisions on hisown behalf.    The patient had likely early onset frontotemporal dementia according to neuropsych which has been exacerbated by the TBI  Will consider Propranolol if HR allows  Would consider antidepressant as outpatient.  Trial Klonopin on 7/28, with benefit, however because sedation, dose decreased on 7/30 6. Skin/Wound Care:Routine pressure relief measures. 7. Fluids/Electrolytes/Nutrition:Monitor I/Os 8. HTN: Monitor BP   ContinueAvapro and metoprolol.  Relatively controlled on 7/31 9. CAD s/p CABG:Continue Lipitor. 10. Bradycardia: --monitor for symptoms. Will set parameters on BB. Vitals:   07/21/19 1925 07/22/19 0629  BP: 106/60 (!) 110/55  Pulse: 67 73  Resp: 16 16  Temp: 98.3 F (36.8 C) 98.6 F (37 C)  SpO2: 100% 99%   Improved on 7/31 11.Tobacco use: On nicotine patch. 12. Constipation:   Bowel meds available 13.  UTI   Bactrim switch to  Cipro 250mg  BID for Pseudomonas on 7/24-7/31  CBC within normal limits on 7/27 14.  Urinary retention  Multifactorial- continues to refuse caths, volumes remain high  BMP within acceptable range on 7/28  LOS: 14 days A FACE TO FACE EVALUATION WAS PERFORMED  [name] 07/22/2019, 10:24 AM

## 2019-07-22 NOTE — Progress Notes (Signed)
Occupational Therapy Session Note  Patient Details  Name: OLGA SEYLER MRN: [id number] Date of Birth: 01/27/1951  Today's Date: 07/22/2019 OT Individual Time: 0700-0813 OT Individual Time Calculation (min): 73 min    Short Term Goals: Week 2:  OT Short Term Goal 1 (Week 2): STG=LTG due to ELOS  Skilled Therapeutic Interventions/Progress Updates:    Pt completed bathing/dressing tasks at goal level (supervision). Pt requires min verbal cues for safety awareness, especially when use of RW is involved. Pt requires more than a reasonable amount of time to complete all tasks.  Pt amb in room with RW to gather supplies and into bathroom for shower.  Pt completed toileting tasks with supervision.  Pt stood at sink to complete grooming tasks. Pt amb with RW to recliner and remained in recliner with all needs within reach and belt alarm activated.   Therapy Documentation Precautions:  Precautions Precautions: Fall Restrictions Weight Bearing Restrictions: No Pain:  Pt c/o L shoulder soreness; repositioned, emotional support   Therapy/Group: Individual Therapy  Leroy Libman 07/22/2019, 7:55 AM

## 2019-07-22 NOTE — Progress Notes (Signed)
Occupational Therapy Session Note  Patient Details  Name: ROMALDO SAVILLE MRN: [id number] Date of Birth: [birth year]  Today's Date: 07/22/2019 OT Individual Time: 0930-1000 OT Individual Time Calculation (min): 30 min    Short Term Goals: Week 2:  OT Short Term Goal 1 (Week 2): STG=LTG due to ELOS  Skilled Therapeutic Interventions/Progress Updates:    OT intervention with focus on sit<>stand, standing balance, functional amb with RW, and safety awareness to increase independence with BADLs.  Pt very deliberate and delayed in all responses to questions and visual assessment.  Pt stated he feels like his ability to visually focus is delayed by "about 1/3 second." Delayed pursuits and saccades noted.  Pt supervision for all tasks with RW. Min verbal cues for safety awareness. Pt remained in bed with all needs within reach and bed alarm activated.  Therapy Documentation Precautions:  Precautions Precautions: Fall Restrictions Weight Bearing Restrictions: No Pain:  Pt denies pain  Therapy/Group: Individual Therapy  Leroy Libman 07/22/2019, 10:01 AM

## 2019-07-22 NOTE — Discharge Instructions (Signed)
°  Inpatient Rehab Discharge Instructions  Lonnie [name] Discharge date and time: 07/23/19   Activities/Precautions/ Functional Status: Activity: no lifting, driving, or strenuous exercise for till cleared by MD Diet: regular diet Wound Care: keep wound clean and dry   Functional status:  ___ No restrictions     ___ Walk up steps independently _X__ 24/7 supervision/assistance   ___ Walk up steps with assistance ___ Intermittent supervision/assistance  ___ Bathe/dress independently ___ Walk with walker     ___ Bathe/dress with assistance ___ Walk Independently    ___ Shower independently __X_ Walk with supervision     _X__ Shower with supervision  _X__ No alcohol     ___ Return to work/school ________   Special Instructions: 1. Need to follow up with urology for voiding problems. You do not empty your bladder fully and if you do not get this taken care of it will affect you kidney function in the long run.  2. Do not use inversion table. No gym till cleared by MD    FOR SAFETY, 24 HOUR SUPERVISION IS RECOMMENDED UNTIL OTHERWISE DIRECTED BY A PHYSICIAN.  ABSOLUTELY NO DRIVING UNTIL OTHERWISE DIRECTED BY A PHYSICIAN. CAR KEYS SHOULD BE HIDDEN IN A SAFE LOCATION IF NEEDED.  DUE TO RISK OF INJURY, PLEASE REMOVE GUNS, KNIVES, OR ANY OTHER POTENTIALLY DANGEROUS OBJECTS AND HAZARDOUS MATERIALS FROM THE HOME.    COMMUNITY REFERRALS UPON DISCHARGE:   Home Health:   PT     OT         Agency:  [facility] Phone:  [phone]  Medical Equipment/Items Ordered:  Rolling walker; bedside commode; shower seat with back  Agency/Supplier:  [facility]         Phone:  [phone]    My questions have been answered and I understand these instructions. I will adhere to these goals and the provided educational materials after my discharge from the hospital.  Patient/Caregiver Signature _______________________________ Date __________  Clinician Signature  _______________________________________ Date __________  Please bring this form and your medication list with you to all your follow-up doctor's appointments.
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
## 2019-07-22 NOTE — Progress Notes (Signed)
Physical Therapy Discharge Summary  Patient Details  Name: Lonnie [name] MRN: [id number] Date of Birth: 04-12-1951  Today's Date: 07/22/2019 PT Individual Time: 1130-1230 PT Individual Time Calculation (min): 60 min    Patient has met 10 of 10 long term goals due to improved activity tolerance, improved balance, improved postural control, increased strength, decreased pain, ability to compensate for deficits, improved attention, improved awareness and improved coordination.  Patient to discharge at an ambulatory level Supervision.   Patient's care partner is independent to provide the necessary physical and cognitive assistance at discharge.  Recommendation:  Patient will benefit from ongoing skilled PT services in home health setting to continue to advance safe functional mobility, address ongoing impairments in strength, balance, activity tolerance, functional mobility, patient/cargiver education, and minimize fall risk.  Equipment: RW  Reasons for discharge: treatment goals met  Patient/family agrees with progress made and goals achieved: Yes  Skilled Therapeutic Interventions: Patient in bed upon PT arrival. Patient alert and agreeable to PT session.  Therapeutic Activity: Bed Mobility: Patient performed supine to/from sit and rolling R and L independently in a flat bed without use of bed rails. He independently donned and doffed B shoes sitting EOB during session.  Transfers: Patient performed sit to/from stand x8, stand pivot x1, and a car transfer with supervision for safety/balance using a RW. Required cues x2 for reaching back to sit for safety. Performed floor transfer with CGA. PT provided demonstration and education about fall recovery, signs and symptoms to check for that would indicate it is unsafe to attempt to get up from a fall, patient recalled 4/4 signs and symptoms x2 during session, and educated on activating emergency response in the event of adverse signs or  symptoms occur from a fall.    Gait Training:  Patient ambulated 150 feet, 100 feet, and 200 feet using RW with supervision. Ambulated as described below, decreased R foot clearance with fatigue. Provided verbal cues for increased step height on R. Patient went up/down a ramp, a curb and over 10 feet of unlevel surfaces with close supervision for safety. Provided cues for use of RW on unlevel surface and curb. He went up/down 16 steps using L rail with reciprocal gait pattern ascending and step-to gait descending with close supervision for safety.   Neuromuscular Re-ed: Patient performed some of the Berg Balance Scale.  Static sitting 2 min: independent Static standing 2 min: supervision Sit to stand: no use of UEs Stand to sit: no use of UEs Stand feet together 1 min: requires supervision Stand eyes closed 10 sec: requires supervision Standing looking behind: cause increased back pain to both sides turning to look to the side 360 turn to the L: cause increased dizziness causing patient to need to sit down. Dizziness recovered after sitting 1 min, but patient did not want to continue with testing.   Patient sitting on edge of bed to eat lunch at end of session, RN made aware, with breaks locked, bed alarm set, and all needs within reach. Educated on fall risk/prevention at home and safety. Instructed patient to use RW for all ambulation and transfer at this time for safety and to have someone with him at all times when he is standing/walking for safety due to decreased balance and safety awareness. Patient in agreement to this for safety at this time.    PT Discharge Precautions/Restrictions Precautions Precautions: Fall Restrictions Weight Bearing Restrictions: No Pain Patient denied pain during session.  Vision/Perception  Vision - Assessment Eye Alignment:  Within Functional Limits Ocular Range of Motion: Within Functional Limits Alignment/Gaze Preference: Within Defined  Limits Tracking/Visual Pursuits: Decreased smoothness of vertical tracking;Decreased smoothness of eye movement to LEFT superior field;Decreased smoothness of eye movement to LEFT inferior field;Decreased smoothness of eye movement to RIGHT superior field;Decreased smoothness of eye movement to RIGHT inferior field;Decreased smoothness of horizontal tracking Saccades: Overshoots;Decreased speed of saccadic movement Perception Perception: Within Functional Limits Praxis Praxis: Intact  Cognition Overall Cognitive Status: History of cognitive impairments - at baseline Arousal/Alertness: Awake/alert Orientation Level: Oriented X4 Attention: Selective;Alternating Sustained Attention: Appears intact Selective Attention: Appears intact Memory: Impaired Memory Impairment: Decreased short term memory;Retrieval deficit;Decreased recall of new information Decreased Short Term Memory: Functional complex;Verbal complex Awareness: Impaired Awareness Impairment: Emergent impairment Problem Solving: Impaired Problem Solving Impairment: Verbal complex;Functional complex Safety/Judgment: Impaired Sensation Sensation Light Touch: Appears Intact Coordination Gross Motor Movements are Fluid and Coordinated: No Coordination and Movement Description: decreased activity tolerance and balance Motor  Motor Motor: Other (comment) Motor - Discharge Observations: Improved strength overall, continues to have decreased activity tolerance and balance affecting functional mobility.  Mobility Bed Mobility Bed Mobility: Supine to Sit;Sit to Supine;Rolling Right;Rolling Left Rolling Right: Independent Rolling Left: Independent Supine to Sit: Independent Sit to Supine: Independent Transfers Transfers: Sit to Stand;Stand to Sit;Stand Pivot Transfers Sit to Stand: Supervision/Verbal cueing(with RW) Stand to Sit: Supervision/Verbal cueing(with RW) Stand Pivot Transfers: Supervision/Verbal cueing(with RW) Stand  Pivot Transfer Details (indicate cue type and reason): supervision for safety due to decreased balance/safety awareness Transfer (Assistive device): Rolling walker Locomotion  Gait Ambulation: Yes Gait Assistance: Supervision/Verbal cueing Assistive device: Rolling walker Gait Assistance Details: Supervision for safety due to decreased blance and safety awareness. Gait Gait: Yes Gait Pattern: Step-through pattern;Decreased step length - right;Decreased stride length;Decreased hip/knee flexion - right;Decreased dorsiflexion - right;Right flexed knee in stance;Left flexed knee in stance;Right foot flat;Decreased trunk rotation;Poor foot clearance - right Gait velocity: decreased Stairs / Additional Locomotion Stairs: Yes Stairs Assistance: Supervision/Verbal cueing Stair Management Technique: One rail Left Number of Stairs: 16 Height of Stairs: 6 Ramp: Supervision/Verbal cueing Curb: Supervision/Verbal cueing Wheelchair Mobility Wheelchair Mobility: No  Trunk/Postural Assessment  Cervical Assessment Cervical Assessment: Within Functional Limits Thoracic Assessment Thoracic Assessment: Within Functional Limits Lumbar Assessment Lumbar Assessment: Within Functional Limits Postural Control Postural Control: Deficits on evaluation Righting Reactions: delayed Protective Responses: delayed  Balance Balance Balance Assessed: Yes Static Sitting Balance Static Sitting - Balance Support: Feet supported;No upper extremity supported Static Sitting - Level of Assistance: 7: Independent Dynamic Sitting Balance Dynamic Sitting - Balance Support: Feet supported Dynamic Sitting - Level of Assistance: 7: Independent Dynamic Sitting - Balance Activities: Lateral lean/weight shifting;Forward lean/weight shifting;Reaching for objects Static Standing Balance Static Standing - Balance Support: During functional activity;No upper extremity supported Static Standing - Level of Assistance: 5: Stand  by assistance Static Stance: Eyes closed Static Stance: Eyes Closed: 10 seconds with supervision Dynamic Standing Balance Dynamic Standing - Balance Support: During functional activity;No upper extremity supported Dynamic Standing - Level of Assistance: 5: Stand by assistance Dynamic Standing - Balance Activities: Reaching for objects;Lateral lean/weight shifting;Forward lean/weight shifting Extremity Assessment  RUE Assessment RUE Assessment: Within Functional Limits LUE Assessment LUE Assessment: Within Functional Limits RLE Assessment RLE Assessment: Within Functional Limits Active Range of Motion (AROM) Comments: WFL for functional mobility General Strength Comments: Grossly 5/5 throughout in sitting LLE Assessment LLE Assessment: Within Functional Limits Active Range of Motion (AROM) Comments: WFL for all functional mobility General Strength Comments: Grossly 5/5 throughout in sitting  Ivy Meriwether L Jalyric Kaestner PT, DPT  07/22/2019, 5:19 PM

## 2019-07-22 NOTE — Progress Notes (Signed)
Speech Language Pathology Discharge Summary  Patient Details  Name: Lonnie [name] MRN: [id number] Date of Birth: [birth year]  Today's Date: 07/22/2019 SLP Individual Time: 1430-1455 SLP Individual Time Calculation (min): 25 min   Skilled Therapeutic Interventions:  Skilled treatment session focused on cognitive goals. SLP facilitated session by re-educating the patient in regards to recommendations from SLP to maximize recall and overall safety at home. Patient verbalized understanding and recalled overall recommendations with Min A verbal cues. Patient left upright in bed with alarm on and all needs within reach. Continue with current plan of care.   Patient has met 4 of 4 long term goals.  Patient to discharge at overall Min;Supervision level.   Reasons goals not met: N/A   Clinical Impression/Discharge Summary: Patient has made functional gains and has met 4 of 4 LTGs this admission. Currently, patient demonstrates behaviors consistent with a [place] Level VIII and requires overall supervision-Min A verbal cues to complete functional and familiar tasks safely in regards to problem solving, attention, awareness and recall. Patient and family education is complete and patient will discharge home with 24 hour supervision. Patient would benefit from f/u SLP services to maximize his cognitive functioning and overall functional independence in order to reduce caregiver burden.     Care Partner:  Caregiver Able to Provide Assistance: Yes  Type of Caregiver Assistance: Physical;Cognitive  Recommendation:  Home Health SLP;24 hour supervision/assistance  Rationale for SLP Follow Up: Maximize cognitive function and independence;Reduce caregiver burden   Equipment: N/A   Reasons for discharge: Discharged from hospital;Treatment goals met   Patient/Family Agrees with Progress Made and Goals Achieved: Yes    [name], [place] 07/22/2019, 6:20 AM

## 2019-07-23 DIAGNOSIS — S069X9A Unspecified intracranial injury with loss of consciousness of unspecified duration, initial encounter: Secondary | ICD-10-CM | POA: Diagnosis not present
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
## 2019-07-23 NOTE — Progress Notes (Signed)
Patient seen and examined.  Stable for discharge.  Discussed follow-up appointments.  Please see discharge summary for details.

## 2019-07-24 NOTE — Progress Notes (Signed)
Social Work Patient ID: Lonnie [name], male   DOB: [birth year], 68 y.o.   MRN: [id number]   CSW met with pt and spoke with his family via telephone to update them on team conference discussion, d/c date, and to arrange family education.  Wife and dtr took turns coming in for family education on 06-24-19 and wife became very overwhelmed.  Dtr was able to calm her and to talk directly with pt about expectations at home.  On 07-22-19, wife called CSW and stated she felt better about things and was ready for his return home.  Dtr to also support her in this care.  CSW will continue to follow and assist as needed.

## 2019-07-24 NOTE — Progress Notes (Signed)
Social Work Discharge Note   The overall goal for the admission was met for:   Discharge location: Yes - home to his house with wife and dtr to provide supervision  Length of Stay: Yes - 15 days  Discharge activity level: Yes - supervision  Home/community participation: Yes  Services provided included: MD, RD, PT, OT, SLP, RN, Pharmacy, [place]: Private Insurance: [employer]  Follow-up services arranged: Home Health: PT/OT from [facility], DME: rolling walker, bedside commode and shower seat from [employer] and Patient/Family has no preference for HH/DME agencies  Comments (or additional information): [name] and dtr both came for family education and were made aware of recommendations of 24/7 supervision for pt.  Patient/Family verbalized understanding of follow-up arrangements: Yes  Individual responsible for coordination of the follow-up plan: pt's wife Lonnie [name] [phone]) and pt's dtr Lonnie [name] [phone])  Confirmed correct DME delivered: Trey Sailors 07/24/2019    Madline Oesterling, Silvestre Mesi

## 2019-07-25 DIAGNOSIS — I255 Ischemic cardiomyopathy: Secondary | ICD-10-CM | POA: Diagnosis not present

## 2019-07-25 DIAGNOSIS — R7303 Prediabetes: Secondary | ICD-10-CM | POA: Diagnosis not present

## 2019-07-25 DIAGNOSIS — S065X0S Traumatic subdural hemorrhage without loss of consciousness, sequela: Secondary | ICD-10-CM | POA: Diagnosis not present

## 2019-07-25 DIAGNOSIS — I2510 Atherosclerotic heart disease of native coronary artery without angina pectoris: Secondary | ICD-10-CM | POA: Diagnosis not present

## 2019-07-25 DIAGNOSIS — G8191 Hemiplegia, unspecified affecting right dominant side: Secondary | ICD-10-CM | POA: Diagnosis not present

## 2019-07-25 DIAGNOSIS — I252 Old myocardial infarction: Secondary | ICD-10-CM | POA: Diagnosis not present

## 2019-07-25 DIAGNOSIS — R69 Illness, unspecified: Secondary | ICD-10-CM | POA: Diagnosis not present

## 2019-07-25 DIAGNOSIS — I10 Essential (primary) hypertension: Secondary | ICD-10-CM | POA: Diagnosis not present

## 2019-07-25 DIAGNOSIS — I739 Peripheral vascular disease, unspecified: Secondary | ICD-10-CM | POA: Diagnosis not present

## 2019-07-25 NOTE — Plan of Care (Signed)
Per Con Memos, LPN

## 2019-07-27 ENCOUNTER — Telehealth: Payer: Self-pay | Admitting: *Deleted

## 2019-07-27 DIAGNOSIS — I255 Ischemic cardiomyopathy: Secondary | ICD-10-CM | POA: Diagnosis not present

## 2019-07-27 DIAGNOSIS — S065X0S Traumatic subdural hemorrhage without loss of consciousness, sequela: Secondary | ICD-10-CM | POA: Diagnosis not present

## 2019-07-27 DIAGNOSIS — I2510 Atherosclerotic heart disease of native coronary artery without angina pectoris: Secondary | ICD-10-CM | POA: Diagnosis not present

## 2019-07-27 DIAGNOSIS — R7303 Prediabetes: Secondary | ICD-10-CM | POA: Diagnosis not present

## 2019-07-27 DIAGNOSIS — I739 Peripheral vascular disease, unspecified: Secondary | ICD-10-CM | POA: Diagnosis not present

## 2019-07-27 DIAGNOSIS — I10 Essential (primary) hypertension: Secondary | ICD-10-CM | POA: Diagnosis not present

## 2019-07-27 DIAGNOSIS — R69 Illness, unspecified: Secondary | ICD-10-CM | POA: Diagnosis not present

## 2019-07-27 DIAGNOSIS — I252 Old myocardial infarction: Secondary | ICD-10-CM | POA: Diagnosis not present

## 2019-07-27 DIAGNOSIS — G8191 Hemiplegia, unspecified affecting right dominant side: Secondary | ICD-10-CM | POA: Diagnosis not present

## 2019-07-27 NOTE — Telephone Encounter (Signed)
[facility]andon OT [facility] called for POC of 1wk1, 2wk3.  Approval given.

## 2019-08-01 DIAGNOSIS — I739 Peripheral vascular disease, unspecified: Secondary | ICD-10-CM | POA: Diagnosis not present

## 2019-08-01 DIAGNOSIS — G8191 Hemiplegia, unspecified affecting right dominant side: Secondary | ICD-10-CM | POA: Diagnosis not present

## 2019-08-01 DIAGNOSIS — S065X0S Traumatic subdural hemorrhage without loss of consciousness, sequela: Secondary | ICD-10-CM | POA: Diagnosis not present

## 2019-08-01 DIAGNOSIS — I2510 Atherosclerotic heart disease of native coronary artery without angina pectoris: Secondary | ICD-10-CM | POA: Diagnosis not present

## 2019-08-01 DIAGNOSIS — I10 Essential (primary) hypertension: Secondary | ICD-10-CM | POA: Diagnosis not present

## 2019-08-01 DIAGNOSIS — R69 Illness, unspecified: Secondary | ICD-10-CM | POA: Diagnosis not present

## 2019-08-01 DIAGNOSIS — I255 Ischemic cardiomyopathy: Secondary | ICD-10-CM | POA: Diagnosis not present

## 2019-08-01 DIAGNOSIS — I252 Old myocardial infarction: Secondary | ICD-10-CM | POA: Diagnosis not present

## 2019-08-01 DIAGNOSIS — R7303 Prediabetes: Secondary | ICD-10-CM | POA: Diagnosis not present

## 2019-08-02 ENCOUNTER — Telehealth: Payer: Self-pay

## 2019-08-02 DIAGNOSIS — G8191 Hemiplegia, unspecified affecting right dominant side: Secondary | ICD-10-CM | POA: Diagnosis not present

## 2019-08-02 DIAGNOSIS — S065X0S Traumatic subdural hemorrhage without loss of consciousness, sequela: Secondary | ICD-10-CM | POA: Diagnosis not present

## 2019-08-02 DIAGNOSIS — I10 Essential (primary) hypertension: Secondary | ICD-10-CM | POA: Diagnosis not present

## 2019-08-02 DIAGNOSIS — I739 Peripheral vascular disease, unspecified: Secondary | ICD-10-CM | POA: Diagnosis not present

## 2019-08-02 DIAGNOSIS — I252 Old myocardial infarction: Secondary | ICD-10-CM | POA: Diagnosis not present

## 2019-08-02 DIAGNOSIS — R7303 Prediabetes: Secondary | ICD-10-CM | POA: Diagnosis not present

## 2019-08-02 DIAGNOSIS — I2510 Atherosclerotic heart disease of native coronary artery without angina pectoris: Secondary | ICD-10-CM | POA: Diagnosis not present

## 2019-08-02 DIAGNOSIS — I255 Ischemic cardiomyopathy: Secondary | ICD-10-CM | POA: Diagnosis not present

## 2019-08-02 DIAGNOSIS — R69 Illness, unspecified: Secondary | ICD-10-CM | POA: Diagnosis not present

## 2019-08-02 NOTE — Telephone Encounter (Signed)
Please call the daughter and let me know what the concerns are

## 2019-08-02 NOTE — Telephone Encounter (Signed)
Copied from [place] [id number]. Topic: General - Other >> Aug 02, 2019 11:49 AM Celene Kras A wrote: Reason for CRM: Pts daughter called and is requesting to speak with pts PCP or nurse before pts appt tomorrow. She states pt is having some issues that she would like to discuss before they are presented to pt by pcp. Please contact daughter.
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
## 2019-08-03 ENCOUNTER — Ambulatory Visit (HOSPITAL_BASED_OUTPATIENT_CLINIC_OR_DEPARTMENT_OTHER)
Admission: RE | Admit: 2019-08-03 | Discharge: 2019-08-03 | Disposition: A | Discharge status: Home / Self Care | Payer: Medicare HMO | Source: Ambulatory Visit | Attending: Internal Medicine | Admitting: Internal Medicine

## 2019-08-03 ENCOUNTER — Encounter: Payer: Self-pay | Admitting: Internal Medicine

## 2019-08-03 ENCOUNTER — Other Ambulatory Visit: Payer: Self-pay

## 2019-08-03 ENCOUNTER — Ambulatory Visit (INDEPENDENT_AMBULATORY_CARE_PROVIDER_SITE_OTHER): Payer: Medicare HMO | Admitting: Internal Medicine

## 2019-08-03 VITALS — BP 156/77 | HR 59 | Temp 98.0°F | Resp 16 | Ht 74.0 in | Wt 178.0 lb

## 2019-08-03 DIAGNOSIS — M7989 Other specified soft tissue disorders: Secondary | ICD-10-CM | POA: Insufficient documentation

## 2019-08-03 DIAGNOSIS — F0280 Dementia in other diseases classified elsewhere without behavioral disturbance: Secondary | ICD-10-CM | POA: Diagnosis not present

## 2019-08-03 DIAGNOSIS — S065X9A Traumatic subdural hemorrhage with loss of consciousness of unspecified duration, initial encounter: Secondary | ICD-10-CM | POA: Diagnosis not present

## 2019-08-03 DIAGNOSIS — R600 Localized edema: Secondary | ICD-10-CM | POA: Diagnosis not present

## 2019-08-03 DIAGNOSIS — G3109 Other frontotemporal dementia: Secondary | ICD-10-CM

## 2019-08-03 DIAGNOSIS — S065XAA Traumatic subdural hemorrhage with loss of consciousness status unknown, initial encounter: Secondary | ICD-10-CM

## 2019-08-03 DIAGNOSIS — R69 Illness, unspecified: Secondary | ICD-10-CM | POA: Diagnosis not present

## 2019-08-03 IMAGING — US RIGHT LOWER EXTREMITY VENOUS ULTRASOUND
1 series · 13 of 24 positions shown · non-contrast
Comparison: None.

CLINICAL DATA: Right lower extremity edema.



[Series 1: right lower extremity venous ultrasound · 13 of 39 slices shown]
[im 1/39]
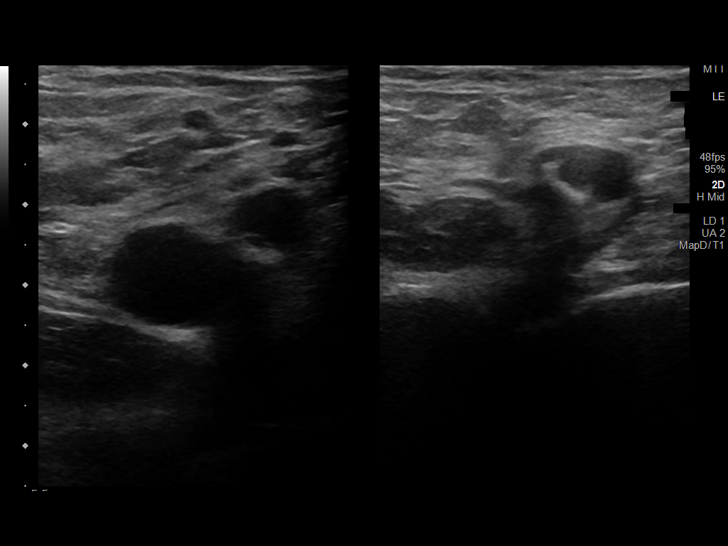
[im 4/39]
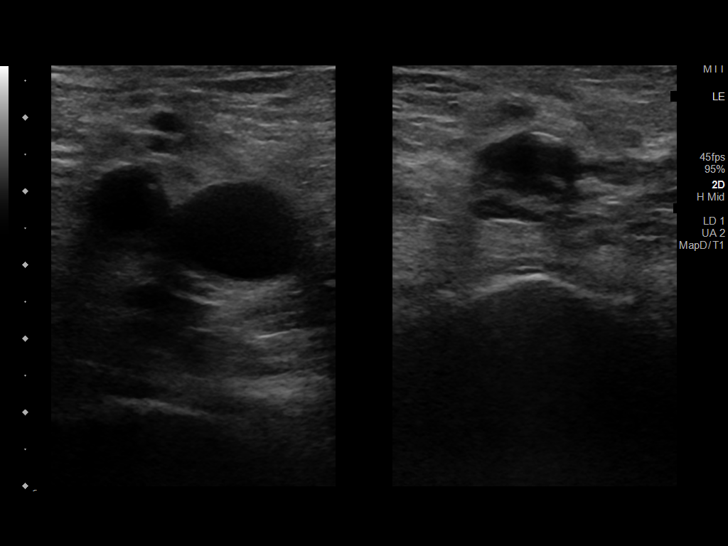
[im 7/39]
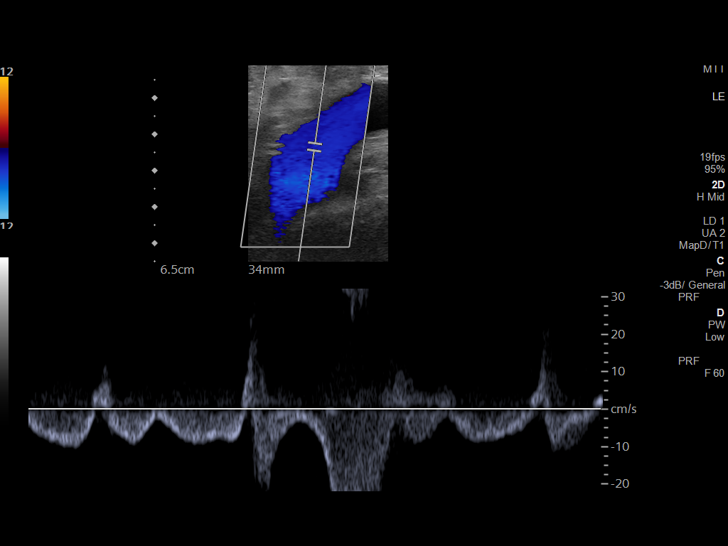
[im 10/39]
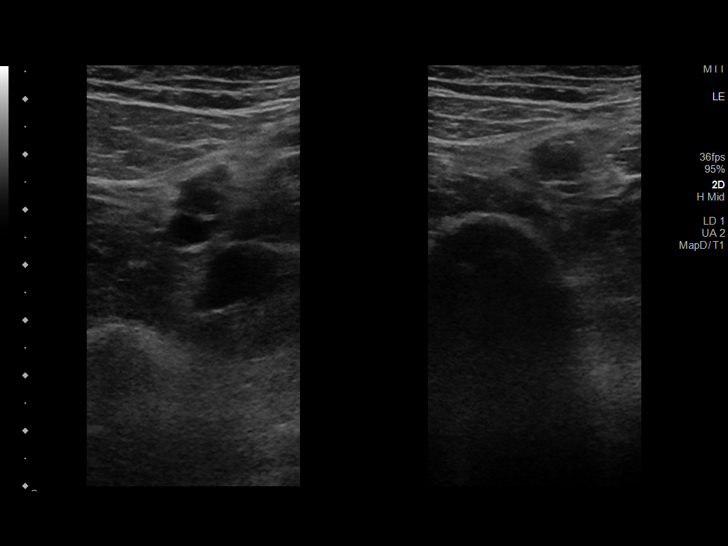
[im 14/39]
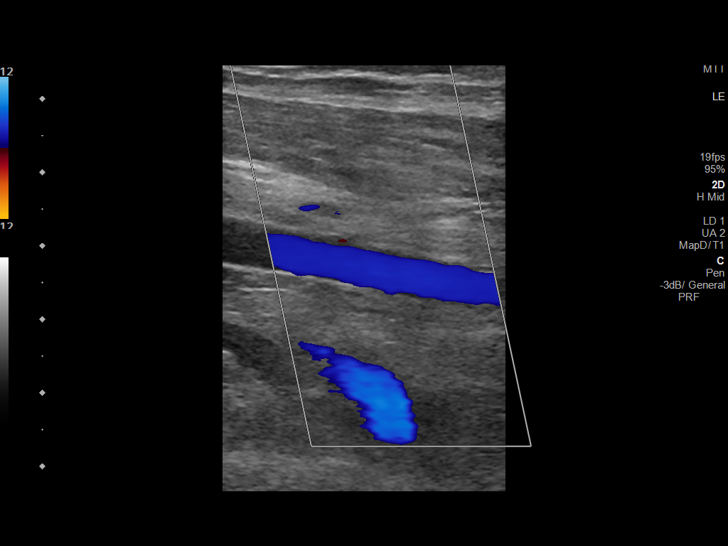
[im 17/39]
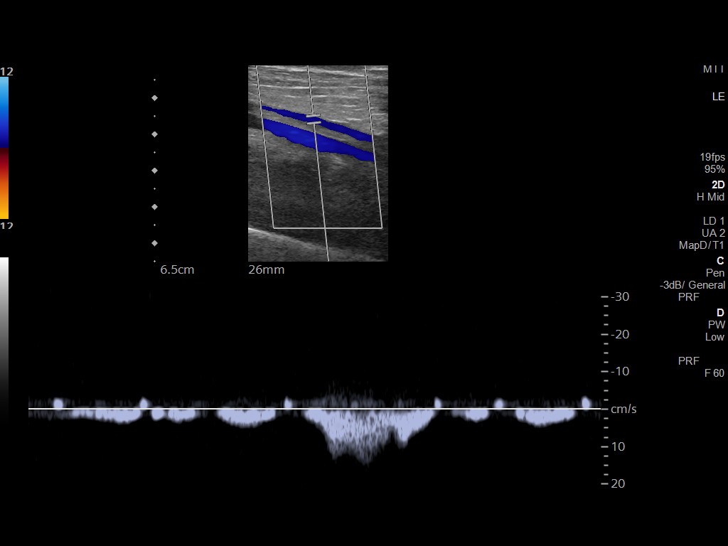
[im 20/39]
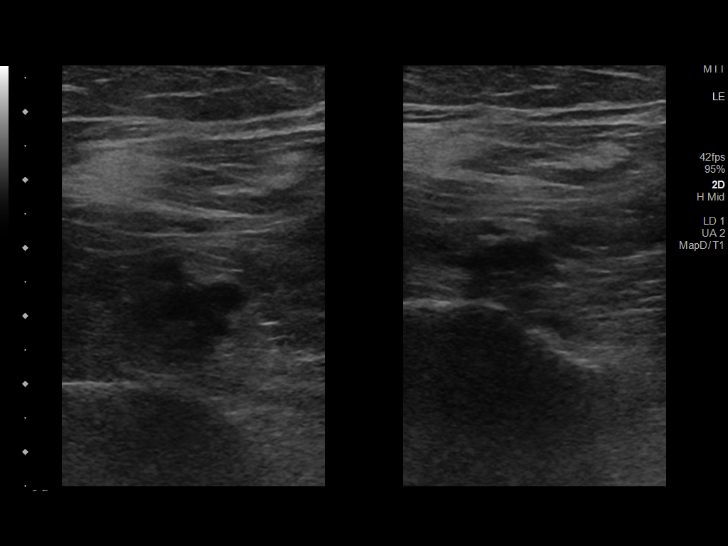
[im 22/39]
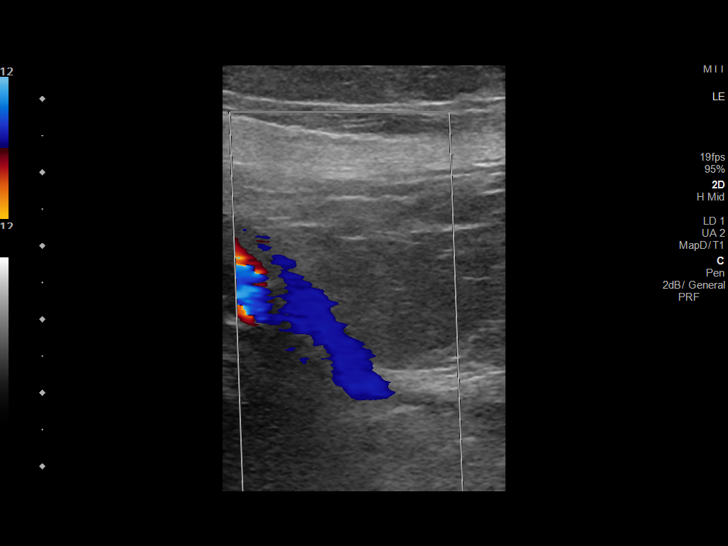
[im 25/39]
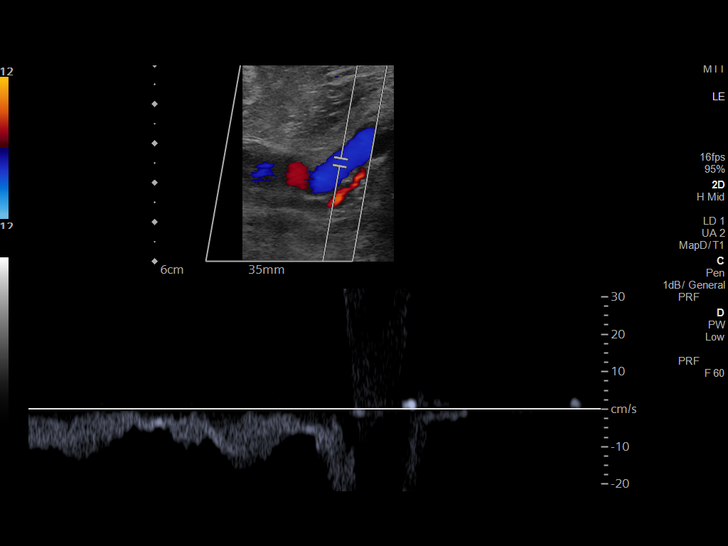
[im 29/39]
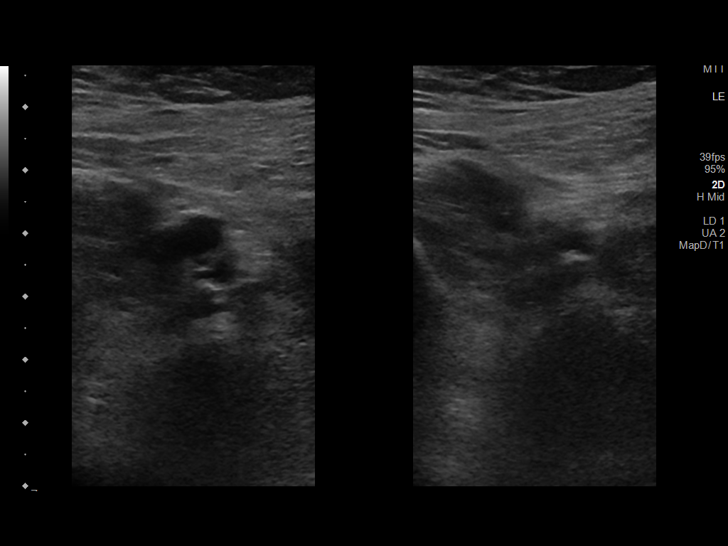
[im 32/39]
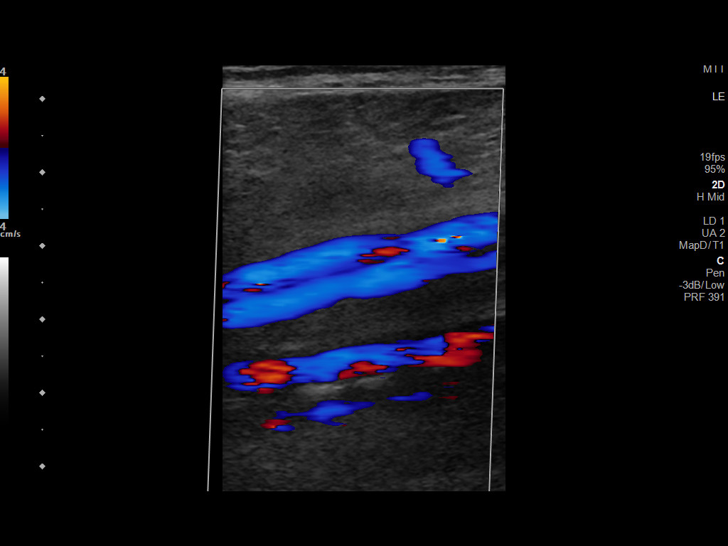
[im 35/39]
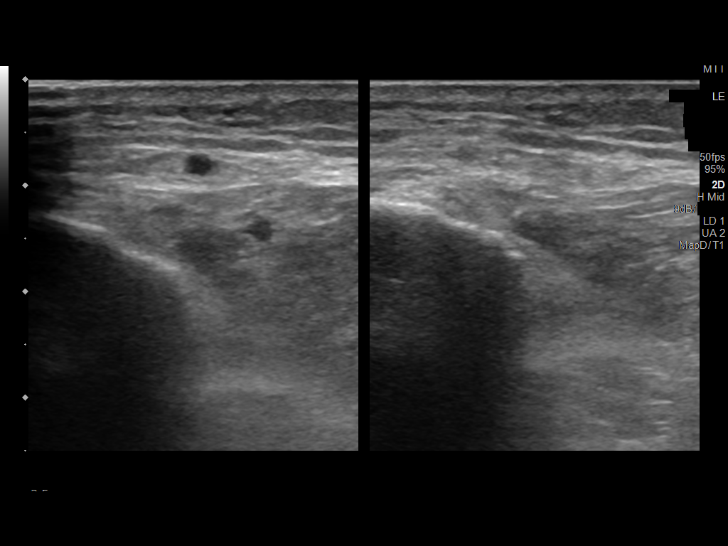
[im 39/39]
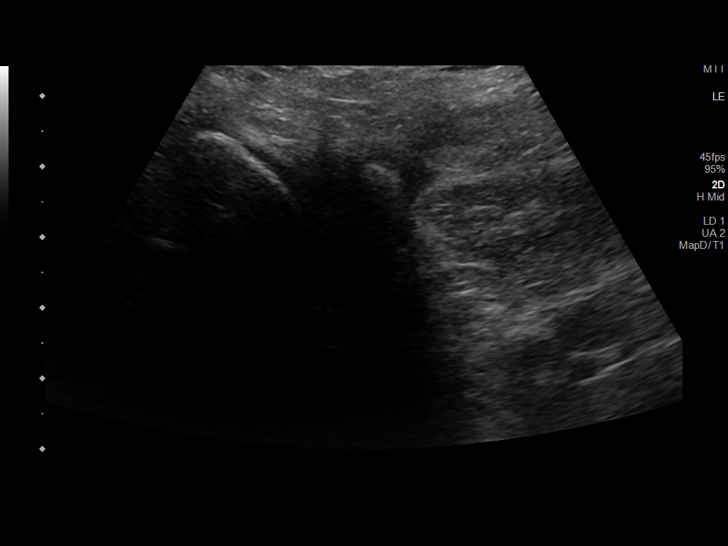

[13 of 24 positions shown; findings below may reference images not displayed]

FINDINGS: Contralateral Common Femoral Vein: Respiratory phasicity is normal
and symmetric with the symptomatic side. No evidence of thrombus.
Normal compressibility.

Common Femoral Vein: No evidence of thrombus. Normal
compressibility, respiratory phasicity and response to augmentation.

Saphenofemoral Junction: No evidence of thrombus. Normal
compressibility and flow on color Doppler imaging.

Profunda Femoral Vein: No evidence of thrombus. Normal
compressibility and flow on color Doppler imaging.

Femoral Vein: No evidence of thrombus. Normal compressibility,
respiratory phasicity and response to augmentation.

Popliteal Vein: No evidence of thrombus. Normal compressibility,
respiratory phasicity and response to augmentation.

Calf Veins: No evidence of thrombus. Normal compressibility and flow
on color Doppler imaging.

Superficial Great Saphenous Vein: No evidence of thrombus. Normal
compressibility.

Venous Reflux:  None.

Other Findings: No evidence of superficial thrombophlebitis or
abnormal fluid collection.
IMPRESSION: No evidence of right lower extremity deep venous thrombosis.

## 2019-08-03 MED ORDER — QUETIAPINE FUMARATE 50 MG PO TABS: 75.0000 mg | ORAL_TABLET | Freq: Two times a day (BID) | ORAL | 1 refills | Status: DC

## 2019-08-03 NOTE — Progress Notes (Signed)
Pre visit review using our clinic review tool, if applicable. No additional management support is needed unless otherwise documented below in the visit note. 

## 2019-08-03 NOTE — Patient Instructions (Addendum)
   GO TO THE FRONT DESK Schedule your next appointment   checkup in 3 months     STOP BY THE FIRST FLOOR:  get the right leg ultrasound  Continue the same medications except for Seroquel: Take 1.5 tablets twice a day  Check the  blood pressure weekly BP GOAL is between 110/65 and  135/85. If it is consistently higher or lower, let me know
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
## 2019-08-04 ENCOUNTER — Encounter: Payer: Medicare HMO | Admitting: Physical Medicine & Rehabilitation

## 2019-08-04 DIAGNOSIS — F0280 Dementia in other diseases classified elsewhere without behavioral disturbance: Secondary | ICD-10-CM | POA: Insufficient documentation

## 2019-08-04 DIAGNOSIS — G3109 Other frontotemporal dementia: Secondary | ICD-10-CM | POA: Insufficient documentation

## 2019-08-04 NOTE — Assessment & Plan Note (Signed)
Subdural hematoma: Status post admission, craniotomy.  She seems to be recuperating well, denies headache, nausea, dizziness.   Pseudomonas UTI, bladder obstruction: s/p abx, currently on Flomax, he does not seem to have a problem. Right lower extremity edema: Check ultrasound rule out DVT Constipation: Reported at hospital, not an issue at this point Shoulder pain: Offered referral for further evaluation and sports medicine, possible injection, strongly refused, he was somewhat aggressive when I suggested that. Frontotemporal dementia, recent TBI: Although patient is alert oriented x3 and his memory seems okay, his behavior suggests FTD, currently on clonazepam and Seroquel. Advised family about this issue, recommend placement when possible, increase Seroquel from 50 mg B.I.D. to 75 mg B.I.D. otherwise continue with clonazepam as well. Fortunately he is not driving and not drinking alcohol. RTC 3 months

## 2019-08-05 DIAGNOSIS — S065X0S Traumatic subdural hemorrhage without loss of consciousness, sequela: Secondary | ICD-10-CM | POA: Diagnosis not present

## 2019-08-05 DIAGNOSIS — I739 Peripheral vascular disease, unspecified: Secondary | ICD-10-CM | POA: Diagnosis not present

## 2019-08-05 DIAGNOSIS — G8191 Hemiplegia, unspecified affecting right dominant side: Secondary | ICD-10-CM | POA: Diagnosis not present

## 2019-08-05 DIAGNOSIS — R69 Illness, unspecified: Secondary | ICD-10-CM | POA: Diagnosis not present

## 2019-08-05 DIAGNOSIS — I252 Old myocardial infarction: Secondary | ICD-10-CM | POA: Diagnosis not present

## 2019-08-05 DIAGNOSIS — R7303 Prediabetes: Secondary | ICD-10-CM | POA: Diagnosis not present

## 2019-08-05 DIAGNOSIS — I2510 Atherosclerotic heart disease of native coronary artery without angina pectoris: Secondary | ICD-10-CM | POA: Diagnosis not present

## 2019-08-05 DIAGNOSIS — I10 Essential (primary) hypertension: Secondary | ICD-10-CM | POA: Diagnosis not present

## 2019-08-05 DIAGNOSIS — I255 Ischemic cardiomyopathy: Secondary | ICD-10-CM | POA: Diagnosis not present

## 2019-08-09 DIAGNOSIS — I252 Old myocardial infarction: Secondary | ICD-10-CM | POA: Diagnosis not present

## 2019-08-09 DIAGNOSIS — R7303 Prediabetes: Secondary | ICD-10-CM | POA: Diagnosis not present

## 2019-08-09 DIAGNOSIS — I10 Essential (primary) hypertension: Secondary | ICD-10-CM | POA: Diagnosis not present

## 2019-08-09 DIAGNOSIS — R69 Illness, unspecified: Secondary | ICD-10-CM | POA: Diagnosis not present

## 2019-08-09 DIAGNOSIS — S065X0S Traumatic subdural hemorrhage without loss of consciousness, sequela: Secondary | ICD-10-CM | POA: Diagnosis not present

## 2019-08-09 DIAGNOSIS — I255 Ischemic cardiomyopathy: Secondary | ICD-10-CM | POA: Diagnosis not present

## 2019-08-09 DIAGNOSIS — I2510 Atherosclerotic heart disease of native coronary artery without angina pectoris: Secondary | ICD-10-CM | POA: Diagnosis not present

## 2019-08-09 DIAGNOSIS — I739 Peripheral vascular disease, unspecified: Secondary | ICD-10-CM | POA: Diagnosis not present

## 2019-08-09 DIAGNOSIS — G8191 Hemiplegia, unspecified affecting right dominant side: Secondary | ICD-10-CM | POA: Diagnosis not present

## 2019-08-10 ENCOUNTER — Telehealth: Payer: Self-pay

## 2019-08-10 DIAGNOSIS — I255 Ischemic cardiomyopathy: Secondary | ICD-10-CM | POA: Diagnosis not present

## 2019-08-10 DIAGNOSIS — G8191 Hemiplegia, unspecified affecting right dominant side: Secondary | ICD-10-CM | POA: Diagnosis not present

## 2019-08-10 DIAGNOSIS — R69 Illness, unspecified: Secondary | ICD-10-CM | POA: Diagnosis not present

## 2019-08-10 DIAGNOSIS — I10 Essential (primary) hypertension: Secondary | ICD-10-CM | POA: Diagnosis not present

## 2019-08-10 DIAGNOSIS — S065X0S Traumatic subdural hemorrhage without loss of consciousness, sequela: Secondary | ICD-10-CM | POA: Diagnosis not present

## 2019-08-10 DIAGNOSIS — I2510 Atherosclerotic heart disease of native coronary artery without angina pectoris: Secondary | ICD-10-CM | POA: Diagnosis not present

## 2019-08-10 DIAGNOSIS — I252 Old myocardial infarction: Secondary | ICD-10-CM | POA: Diagnosis not present

## 2019-08-10 DIAGNOSIS — R7303 Prediabetes: Secondary | ICD-10-CM | POA: Diagnosis not present

## 2019-08-10 DIAGNOSIS — I739 Peripheral vascular disease, unspecified: Secondary | ICD-10-CM | POA: Diagnosis not present

## 2019-08-10 NOTE — Telephone Encounter (Signed)
[facility] requesting palliative care orders- form signed and faxed back to [phone]. Form sent for scanning.

## 2019-08-12 DIAGNOSIS — R7303 Prediabetes: Secondary | ICD-10-CM | POA: Diagnosis not present

## 2019-08-12 DIAGNOSIS — I255 Ischemic cardiomyopathy: Secondary | ICD-10-CM | POA: Diagnosis not present

## 2019-08-12 DIAGNOSIS — R69 Illness, unspecified: Secondary | ICD-10-CM | POA: Diagnosis not present

## 2019-08-12 DIAGNOSIS — S065X0S Traumatic subdural hemorrhage without loss of consciousness, sequela: Secondary | ICD-10-CM | POA: Diagnosis not present

## 2019-08-12 DIAGNOSIS — I10 Essential (primary) hypertension: Secondary | ICD-10-CM | POA: Diagnosis not present

## 2019-08-12 DIAGNOSIS — I2510 Atherosclerotic heart disease of native coronary artery without angina pectoris: Secondary | ICD-10-CM | POA: Diagnosis not present

## 2019-08-12 DIAGNOSIS — I739 Peripheral vascular disease, unspecified: Secondary | ICD-10-CM | POA: Diagnosis not present

## 2019-08-12 DIAGNOSIS — I252 Old myocardial infarction: Secondary | ICD-10-CM | POA: Diagnosis not present

## 2019-08-12 DIAGNOSIS — G8191 Hemiplegia, unspecified affecting right dominant side: Secondary | ICD-10-CM | POA: Diagnosis not present

## 2019-08-14 ENCOUNTER — Other Ambulatory Visit: Payer: Self-pay | Admitting: Physical Medicine and Rehabilitation

## 2019-08-15 DIAGNOSIS — M545 Low back pain: Secondary | ICD-10-CM | POA: Diagnosis not present

## 2019-08-15 DIAGNOSIS — M542 Cervicalgia: Secondary | ICD-10-CM | POA: Diagnosis not present

## 2019-08-15 DIAGNOSIS — M25512 Pain in left shoulder: Secondary | ICD-10-CM | POA: Diagnosis not present

## 2019-08-16 ENCOUNTER — Other Ambulatory Visit: Payer: Self-pay | Admitting: Physician Assistant

## 2019-08-16 ENCOUNTER — Telehealth: Payer: Self-pay | Admitting: Internal Medicine

## 2019-08-16 NOTE — Telephone Encounter (Signed)
That is okay.  Thank you 

## 2019-08-16 NOTE — Telephone Encounter (Signed)
Please advise 

## 2019-08-16 NOTE — Telephone Encounter (Signed)
LMOM for Kelly, verbal orders given.  

## 2019-08-16 NOTE — Telephone Encounter (Signed)
Claiborne Billings, from [facility], called and is requesting a speech therapy evaluation for pt in his home. Please advise.   Callback # [phone] secure.

## 2019-08-17 DIAGNOSIS — G8191 Hemiplegia, unspecified affecting right dominant side: Secondary | ICD-10-CM | POA: Diagnosis not present

## 2019-08-17 DIAGNOSIS — R7303 Prediabetes: Secondary | ICD-10-CM | POA: Diagnosis not present

## 2019-08-17 DIAGNOSIS — I255 Ischemic cardiomyopathy: Secondary | ICD-10-CM | POA: Diagnosis not present

## 2019-08-17 DIAGNOSIS — S065X0S Traumatic subdural hemorrhage without loss of consciousness, sequela: Secondary | ICD-10-CM | POA: Diagnosis not present

## 2019-08-17 DIAGNOSIS — I739 Peripheral vascular disease, unspecified: Secondary | ICD-10-CM | POA: Diagnosis not present

## 2019-08-17 DIAGNOSIS — I2510 Atherosclerotic heart disease of native coronary artery without angina pectoris: Secondary | ICD-10-CM | POA: Diagnosis not present

## 2019-08-17 DIAGNOSIS — R69 Illness, unspecified: Secondary | ICD-10-CM | POA: Diagnosis not present

## 2019-08-17 DIAGNOSIS — I10 Essential (primary) hypertension: Secondary | ICD-10-CM | POA: Diagnosis not present

## 2019-08-17 DIAGNOSIS — I252 Old myocardial infarction: Secondary | ICD-10-CM | POA: Diagnosis not present

## 2019-08-18 ENCOUNTER — Other Ambulatory Visit: Payer: Self-pay | Admitting: Physical Medicine and Rehabilitation

## 2019-08-18 ENCOUNTER — Other Ambulatory Visit: Payer: Self-pay | Admitting: Physician Assistant

## 2019-08-18 DIAGNOSIS — S065X9A Traumatic subdural hemorrhage with loss of consciousness of unspecified duration, initial encounter: Secondary | ICD-10-CM

## 2019-08-18 DIAGNOSIS — S065XAA Traumatic subdural hemorrhage with loss of consciousness status unknown, initial encounter: Secondary | ICD-10-CM

## 2019-08-19 ENCOUNTER — Other Ambulatory Visit: Payer: Self-pay | Admitting: Internal Medicine

## 2019-08-19 ENCOUNTER — Telehealth: Payer: Self-pay

## 2019-08-19 MED ORDER — CLONAZEPAM 0.25 MG PO TBDP: 0.2500 mg | ORAL_TABLET | Freq: Two times a day (BID) | ORAL | 0 refills | Status: DC

## 2019-08-19 MED ORDER — DOCUSATE SODIUM 100 MG PO CAPS: 100.0000 mg | ORAL_CAPSULE | Freq: Two times a day (BID) | ORAL | 0 refills | Status: DC

## 2019-08-19 NOTE — Telephone Encounter (Signed)
Copied from [place] [id number]. Topic: Quick Communication - Rx Refill/Question >> Aug 19, 2019  4:16 PM Izola Price, [place] A wrote: Medication:docusate sodium (COLACE) 100 MG capsule, clonazePAM (KLONOPIN) 0.25 MG disintegrating tablet (Pharmacy is waiting on meds to be sent over today for patient.)  Has the patient contacted their pharmacy? Yes (Agent: If no, request that the patient contact the pharmacy for the refill.) (Agent: If yes, when and what did the pharmacy advise?)Contact PCP  Preferred Pharmacy (with phone number or street name):[id number] [address], [place] [phone] (Phone) [phone] (Fax)    Agent: Please be advised that RX refills may take up to 3 business days. We ask that you follow-up with your pharmacy.

## 2019-08-19 NOTE — Telephone Encounter (Signed)
Sent!

## 2019-08-19 NOTE — Telephone Encounter (Signed)
Colace and Klonopin were sent to pharmacy on 08/19/19. Receipt of e prescribed medication confirmed by pharmacy.

## 2019-08-19 NOTE — Telephone Encounter (Signed)
Refill request for clonazepam and docusate.   Last OV: 08/03/2019 Last Fill on docusate: 07/22/2019 #60 and 0RF Pt sig: 1 capsule bid Last Fill on clonazepam: 07/22/2019 #60 AND 0rf Pt sig: 1 tab bid UDS: None

## 2019-08-22 DIAGNOSIS — I255 Ischemic cardiomyopathy: Secondary | ICD-10-CM | POA: Diagnosis not present

## 2019-08-22 DIAGNOSIS — R7303 Prediabetes: Secondary | ICD-10-CM | POA: Diagnosis not present

## 2019-08-22 DIAGNOSIS — I10 Essential (primary) hypertension: Secondary | ICD-10-CM | POA: Diagnosis not present

## 2019-08-22 DIAGNOSIS — S065X0S Traumatic subdural hemorrhage without loss of consciousness, sequela: Secondary | ICD-10-CM | POA: Diagnosis not present

## 2019-08-22 DIAGNOSIS — R69 Illness, unspecified: Secondary | ICD-10-CM | POA: Diagnosis not present

## 2019-08-22 DIAGNOSIS — G8191 Hemiplegia, unspecified affecting right dominant side: Secondary | ICD-10-CM | POA: Diagnosis not present

## 2019-08-22 DIAGNOSIS — I2510 Atherosclerotic heart disease of native coronary artery without angina pectoris: Secondary | ICD-10-CM | POA: Diagnosis not present

## 2019-08-22 DIAGNOSIS — I252 Old myocardial infarction: Secondary | ICD-10-CM | POA: Diagnosis not present

## 2019-08-22 DIAGNOSIS — I739 Peripheral vascular disease, unspecified: Secondary | ICD-10-CM | POA: Diagnosis not present

## 2019-08-23 ENCOUNTER — Telehealth: Payer: Self-pay | Admitting: Physical Medicine & Rehabilitation

## 2019-08-23 DIAGNOSIS — M25512 Pain in left shoulder: Secondary | ICD-10-CM | POA: Diagnosis not present

## 2019-08-23 DIAGNOSIS — M542 Cervicalgia: Secondary | ICD-10-CM | POA: Diagnosis not present

## 2019-08-23 NOTE — Telephone Encounter (Signed)
He needs follow up.  Thanks.

## 2019-08-23 NOTE — Telephone Encounter (Signed)
His Hospital f/u was cancelled. He has not followed up in our office. Is this order okay to approve?

## 2019-08-23 NOTE — Telephone Encounter (Signed)
Lonnie [name] ST with [facility] needs to get verbal orders to see patient 2w4 for Cognitive deficits.  Please call her at [phone].

## 2019-08-24 NOTE — Telephone Encounter (Addendum)
I have called and spoken with Mr Lonnie [name] and he has agreed to set up appt for HFU 10/06/19 (first available). Approval given for ST.

## 2019-08-25 ENCOUNTER — Other Ambulatory Visit: Payer: Self-pay

## 2019-08-25 ENCOUNTER — Ambulatory Visit
Admission: RE | Admit: 2019-08-25 | Discharge: 2019-08-25 | Disposition: A | Discharge status: Home / Self Care | Payer: Medicare HMO | Source: Ambulatory Visit | Attending: Physician Assistant | Admitting: Physician Assistant

## 2019-08-25 DIAGNOSIS — S065XAA Traumatic subdural hemorrhage with loss of consciousness status unknown, initial encounter: Secondary | ICD-10-CM

## 2019-08-25 DIAGNOSIS — M25512 Pain in left shoulder: Secondary | ICD-10-CM | POA: Diagnosis not present

## 2019-08-25 DIAGNOSIS — S065X9A Traumatic subdural hemorrhage with loss of consciousness of unspecified duration, initial encounter: Secondary | ICD-10-CM

## 2019-08-25 DIAGNOSIS — S065X0A Traumatic subdural hemorrhage without loss of consciousness, initial encounter: Secondary | ICD-10-CM | POA: Diagnosis not present
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
## 2019-08-30 ENCOUNTER — Telehealth: Payer: Self-pay

## 2019-08-30 DIAGNOSIS — F0281 Dementia in other diseases classified elsewhere with behavioral disturbance: Secondary | ICD-10-CM

## 2019-08-30 DIAGNOSIS — S069X0S Unspecified intracranial injury without loss of consciousness, sequela: Secondary | ICD-10-CM

## 2019-08-30 DIAGNOSIS — S065X9A Traumatic subdural hemorrhage with loss of consciousness of unspecified duration, initial encounter: Secondary | ICD-10-CM

## 2019-08-30 DIAGNOSIS — S065XAA Traumatic subdural hemorrhage with loss of consciousness status unknown, initial encounter: Secondary | ICD-10-CM

## 2019-08-30 DIAGNOSIS — F02818 Dementia in other diseases classified elsewhere, unspecified severity, with other behavioral disturbance: Secondary | ICD-10-CM

## 2019-08-30 NOTE — Telephone Encounter (Signed)
Copied from [place] [id number]. Topic: Referral - Request for Referral >> Aug 30, 2019  1:26 PM Scherrie Gerlach wrote: Daughter calling to ask if the dr will put in another order for home health to resume.  Pt was doing good on home health.   Pt is becoming combative and forceful. PT and OT has stopped. But the therapy people advised if they could get order from the dr for palliative or hospice with dx dementia, they could come. Please call the daughter back.

## 2019-08-30 NOTE — Telephone Encounter (Signed)
Agree, please enter order for palliative care.  DX frontotemporal dementia with behavioral issues

## 2019-08-30 NOTE — Telephone Encounter (Signed)
Referral placed.

## 2019-09-01 MED ORDER — QUETIAPINE FUMARATE 50 MG PO TABS: 100.0000 mg | ORAL_TABLET | Freq: Two times a day (BID) | ORAL | 1 refills | Status: DC

## 2019-09-01 NOTE — Telephone Encounter (Signed)
Spoke w/ [name]- Pt's daughter, informed of recommendations. Seroquel 50mg : [facility]. Informed [name] that palliative care referral has been placed.

## 2019-09-01 NOTE — Telephone Encounter (Signed)
Please advise 

## 2019-09-01 NOTE — Addendum Note (Signed)
Addended byDamita Dunnings D on: 09/01/2019 01:36 PM   Modules accepted: Orders

## 2019-09-01 NOTE — Telephone Encounter (Signed)
Pts daughter advised of referral placed/ she asked if there is any medication that can help until the referral is completed or if the dose for QUEtiapine (SEROQUEL) 50 MG tablet Can be increased. Pt had an appointment and was combative, banging on table and yelling/ please advise

## 2019-09-01 NOTE — Telephone Encounter (Signed)
Current dose of Seroquel 50 mg: 1.5 tablet twice a day Okay to increase Seroquel 50 mg to: 2 tablets twice a day. That would be the maximum possible dose. Also, suggest family to discuss next steps with palliative care.

## 2019-09-02 ENCOUNTER — Telehealth: Payer: Self-pay | Admitting: Internal Medicine

## 2019-09-02 NOTE — Telephone Encounter (Signed)
Spoke with wife Otis Brace and daughter [name] regarding Palliative services and they were in agreement with this.  I have scheduled a Telephone Palliative Consult for 09/06/19 @ 3 PM.

## 2019-09-05 ENCOUNTER — Other Ambulatory Visit: Payer: Self-pay | Admitting: Physician Assistant

## 2019-09-05 DIAGNOSIS — S065X9A Traumatic subdural hemorrhage with loss of consciousness of unspecified duration, initial encounter: Secondary | ICD-10-CM

## 2019-09-05 DIAGNOSIS — S065XAA Traumatic subdural hemorrhage with loss of consciousness status unknown, initial encounter: Secondary | ICD-10-CM

## 2019-09-06 ENCOUNTER — Other Ambulatory Visit: Payer: Medicare HMO | Admitting: Internal Medicine

## 2019-09-06 ENCOUNTER — Other Ambulatory Visit: Payer: Self-pay

## 2019-09-06 DIAGNOSIS — G8191 Hemiplegia, unspecified affecting right dominant side: Secondary | ICD-10-CM | POA: Diagnosis not present

## 2019-09-06 DIAGNOSIS — I739 Peripheral vascular disease, unspecified: Secondary | ICD-10-CM | POA: Diagnosis not present

## 2019-09-06 DIAGNOSIS — I2510 Atherosclerotic heart disease of native coronary artery without angina pectoris: Secondary | ICD-10-CM | POA: Diagnosis not present

## 2019-09-06 DIAGNOSIS — R7303 Prediabetes: Secondary | ICD-10-CM | POA: Diagnosis not present

## 2019-09-06 DIAGNOSIS — I255 Ischemic cardiomyopathy: Secondary | ICD-10-CM | POA: Diagnosis not present

## 2019-09-06 DIAGNOSIS — S065X0S Traumatic subdural hemorrhage without loss of consciousness, sequela: Secondary | ICD-10-CM | POA: Diagnosis not present

## 2019-09-06 DIAGNOSIS — I10 Essential (primary) hypertension: Secondary | ICD-10-CM | POA: Diagnosis not present

## 2019-09-06 DIAGNOSIS — R69 Illness, unspecified: Secondary | ICD-10-CM | POA: Diagnosis not present

## 2019-09-06 DIAGNOSIS — I252 Old myocardial infarction: Secondary | ICD-10-CM | POA: Diagnosis not present

## 2019-09-07 DIAGNOSIS — R69 Illness, unspecified: Secondary | ICD-10-CM | POA: Diagnosis not present

## 2019-09-08 DIAGNOSIS — R7303 Prediabetes: Secondary | ICD-10-CM | POA: Diagnosis not present

## 2019-09-08 DIAGNOSIS — I739 Peripheral vascular disease, unspecified: Secondary | ICD-10-CM | POA: Diagnosis not present

## 2019-09-08 DIAGNOSIS — G8191 Hemiplegia, unspecified affecting right dominant side: Secondary | ICD-10-CM | POA: Diagnosis not present

## 2019-09-08 DIAGNOSIS — I252 Old myocardial infarction: Secondary | ICD-10-CM | POA: Diagnosis not present

## 2019-09-08 DIAGNOSIS — R69 Illness, unspecified: Secondary | ICD-10-CM | POA: Diagnosis not present

## 2019-09-08 DIAGNOSIS — I2510 Atherosclerotic heart disease of native coronary artery without angina pectoris: Secondary | ICD-10-CM | POA: Diagnosis not present

## 2019-09-08 DIAGNOSIS — S065X0S Traumatic subdural hemorrhage without loss of consciousness, sequela: Secondary | ICD-10-CM | POA: Diagnosis not present

## 2019-09-08 DIAGNOSIS — I255 Ischemic cardiomyopathy: Secondary | ICD-10-CM | POA: Diagnosis not present

## 2019-09-08 DIAGNOSIS — I10 Essential (primary) hypertension: Secondary | ICD-10-CM | POA: Diagnosis not present

## 2019-09-09 ENCOUNTER — Other Ambulatory Visit: Payer: Medicare HMO | Admitting: Internal Medicine

## 2019-09-09 ENCOUNTER — Other Ambulatory Visit: Payer: Self-pay | Admitting: Physical Medicine and Rehabilitation

## 2019-09-09 ENCOUNTER — Other Ambulatory Visit: Payer: Self-pay

## 2019-09-12 DIAGNOSIS — R69 Illness, unspecified: Secondary | ICD-10-CM | POA: Diagnosis not present

## 2019-09-13 ENCOUNTER — Other Ambulatory Visit: Payer: Self-pay | Admitting: Physical Medicine and Rehabilitation

## 2019-09-17 ENCOUNTER — Telehealth: Payer: Self-pay | Admitting: Internal Medicine

## 2019-09-19 DIAGNOSIS — M5032 Other cervical disc degeneration, mid-cervical region, unspecified level: Secondary | ICD-10-CM | POA: Diagnosis not present

## 2019-09-19 DIAGNOSIS — M25512 Pain in left shoulder: Secondary | ICD-10-CM | POA: Diagnosis not present

## 2019-09-19 DIAGNOSIS — M5412 Radiculopathy, cervical region: Secondary | ICD-10-CM | POA: Diagnosis not present

## 2019-09-19 DIAGNOSIS — M542 Cervicalgia: Secondary | ICD-10-CM | POA: Diagnosis not present

## 2019-09-19 NOTE — Telephone Encounter (Signed)
Clonazepam refill.   Last OV: 08/03/2019 Last Fill: 08/19/2019 #60 and 0RF Pt sig: 1 tab bid UDS: None

## 2019-09-19 NOTE — Telephone Encounter (Signed)
Pt daughter daughter calling to check status

## 2019-09-19 NOTE — Telephone Encounter (Signed)
Sent!

## 2019-09-20 ENCOUNTER — Telehealth: Payer: Self-pay

## 2019-09-20 NOTE — Telephone Encounter (Signed)
I recommend to be seen here first to reassess his symptoms. Please arrange.  If they refuse, then arrange ENT referral, he has complained of some ear fullness bilaterally in the past, Eustachian tube dysfunction?

## 2019-09-20 NOTE — Telephone Encounter (Signed)
Copied from [place] [id number]. Topic: Referral - Request for Referral >> Sep 20, 2019  9:16 AM Pauline Good wrote: Has patient seen PCP for this complaint? yes *If NO, is insurance requiring patient see PCP for this issue before PCP can refer them? Referral for which specialty: ENT Preferred provider/office: don't matter Reason for referral: fluid blockage in tubes

## 2019-09-20 NOTE — Telephone Encounter (Signed)
LMOM for Pt's daughter [name] to return call. Okay for PEC to discuss.

## 2019-09-22 ENCOUNTER — Encounter: Payer: Self-pay | Admitting: Internal Medicine

## 2019-09-22 ENCOUNTER — Telehealth: Payer: Self-pay | Admitting: Internal Medicine

## 2019-09-22 MED ORDER — QUETIAPINE FUMARATE 50 MG PO TABS: 150.0000 mg | ORAL_TABLET | Freq: Two times a day (BID) | ORAL | 1 refills | Status: DC

## 2019-09-22 NOTE — Addendum Note (Signed)
Addended by: Kathlene November E on: 09/22/2019 05:31 PM   Modules accepted: Orders

## 2019-09-22 NOTE — Telephone Encounter (Signed)
Please make a virtual appointment for next week

## 2019-09-22 NOTE — Telephone Encounter (Signed)
This encounter was created in error - please disregard.

## 2019-09-22 NOTE — Telephone Encounter (Signed)
Please advise 

## 2019-09-22 NOTE — Telephone Encounter (Signed)
Gonzella Lex, NP with [facility] calling regarding patient. Aware Dr. Larose Kells not available at this time.   States pt was placed on Seroquel 100mg  BID which has been ineffective. Reports pt with aggressive behaviors, angry affect. States family trying to manage at home. Wife and daughter with health issues making care difficult. Questions if pt could have PBA s/p CVA.  States pt will not agree to behavioral health referral.  Questioning if an increase in Seroquel may be considered or addition of Depakote. Also may need Home Health assist or may consider placement.   Ms. Huston Foley [facility] # [phone] Please advise.

## 2019-09-22 NOTE — Telephone Encounter (Addendum)
Spoke with Mrs. Cates, NP:  The patient continued to be angry, bitter, sometimes physically aggressive. He refuses any care except for take the medications that we are prescribing.  Ideally I will refer him to psychiatry or neurology.  During his rehab in-house he exhibited "moderate cognitive deficits affecting recall, problem solving, attention and awareness of deficits" per discharge summary.  Pharmacological options include include Seroquel or add Depakote. Eventually we agreed on Seroquel 50 mg: 3 tablets twice a day, new prescription sent. Low threshold to go to the ER if he becomes more agitated.   He eventually will need placement rather soon. We will call and make a virtual appointment with me next week Mrs. Huston Foley will notify the family of above.

## 2019-09-23 ENCOUNTER — Other Ambulatory Visit: Payer: Medicare HMO | Admitting: Internal Medicine

## 2019-09-23 ENCOUNTER — Other Ambulatory Visit: Payer: Self-pay

## 2019-09-23 ENCOUNTER — Telehealth: Payer: Self-pay

## 2019-09-23 NOTE — Telephone Encounter (Signed)
See telephone note 09/22/2019.

## 2019-09-23 NOTE — Telephone Encounter (Signed)
PA approved. Effective 12/22/2018 to 12/22/2019.    

## 2019-09-23 NOTE — Telephone Encounter (Signed)
DONE

## 2019-09-23 NOTE — Telephone Encounter (Signed)
PA initiated via Covermymeds; KEY: [id number]. Awaiting determination.

## 2019-09-26 ENCOUNTER — Ambulatory Visit
Admission: RE | Admit: 2019-09-26 | Discharge: 2019-09-26 | Disposition: A | Discharge status: Home / Self Care | Payer: Medicare HMO | Source: Ambulatory Visit | Attending: Physician Assistant | Admitting: Physician Assistant

## 2019-09-26 ENCOUNTER — Telehealth: Payer: Self-pay

## 2019-09-26 ENCOUNTER — Other Ambulatory Visit: Payer: Self-pay

## 2019-09-26 DIAGNOSIS — S065XAA Traumatic subdural hemorrhage with loss of consciousness status unknown, initial encounter: Secondary | ICD-10-CM

## 2019-09-26 DIAGNOSIS — I6200 Nontraumatic subdural hemorrhage, unspecified: Secondary | ICD-10-CM | POA: Diagnosis not present

## 2019-09-26 DIAGNOSIS — S065X9A Traumatic subdural hemorrhage with loss of consciousness of unspecified duration, initial encounter: Secondary | ICD-10-CM

## 2019-09-26 IMAGING — CT CT HEAD W/O CM
1 series · 15 of 30 positions shown, 19 images · non-contrast
Comparison: [DATE]

CLINICAL DATA: Follow-up subdural hematoma

EXAM:
CT HEAD WITHOUT CONTRAST
TECHNIQUE: Contiguous axial images were obtained from the base of the skull
through the vertex without intravenous contrast.

[Series 2: head w/(date) · axial · 0.49mm/px · z∈[-209,-49]mm · 15 of 36 slices shown, 19 images]
[im 2/36  brain]
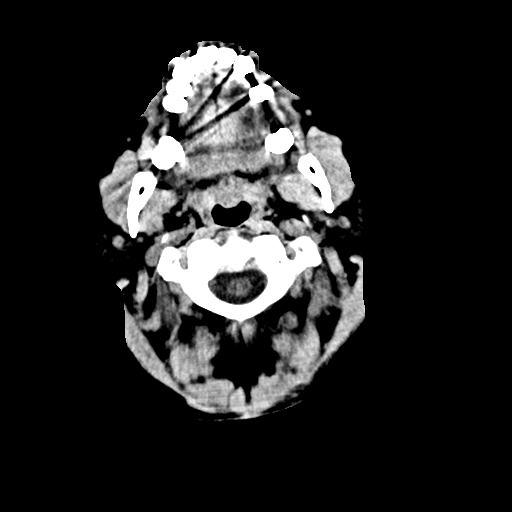
[im 2/36  bone]
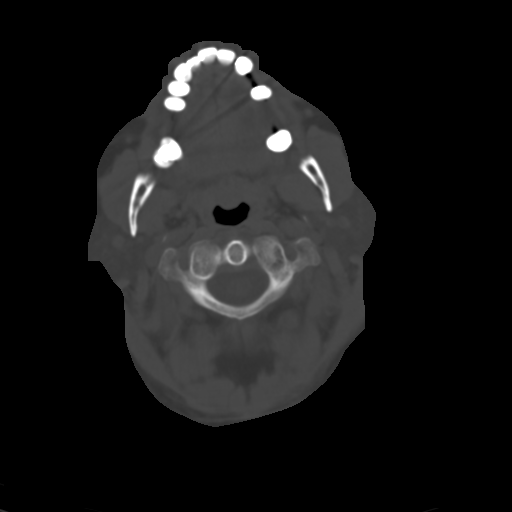
[im 4/36  brain]
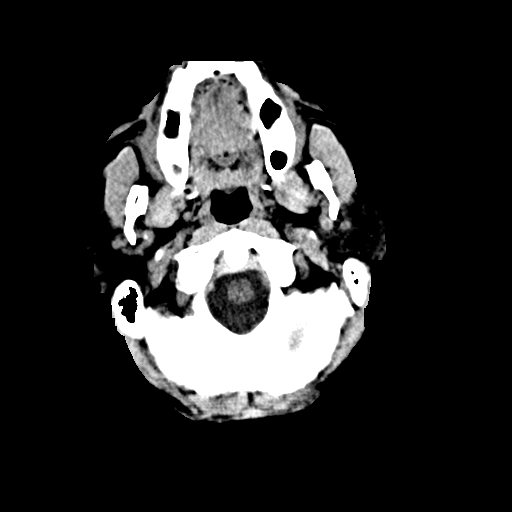
[im 7/36  brain]
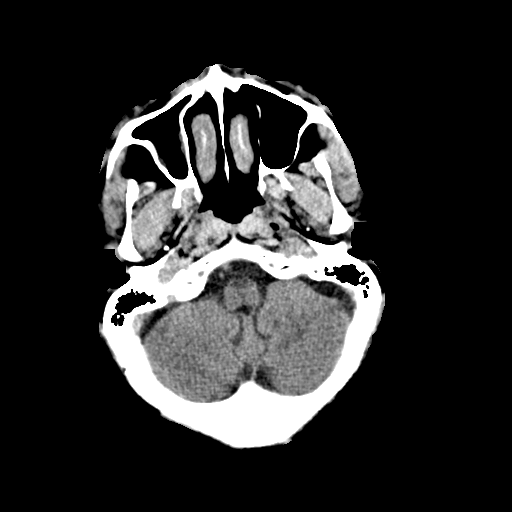
[im 9/36  brain]
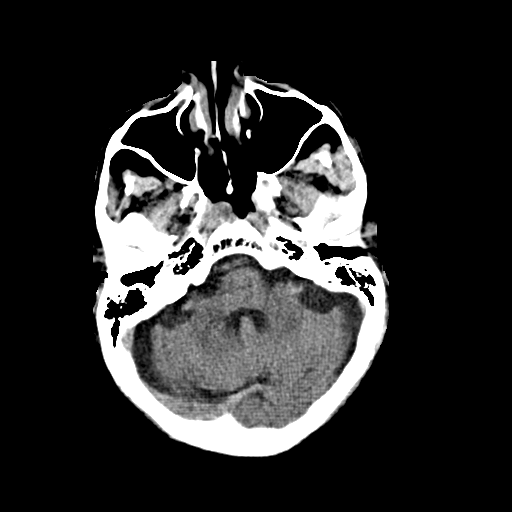
[im 11/36  brain]
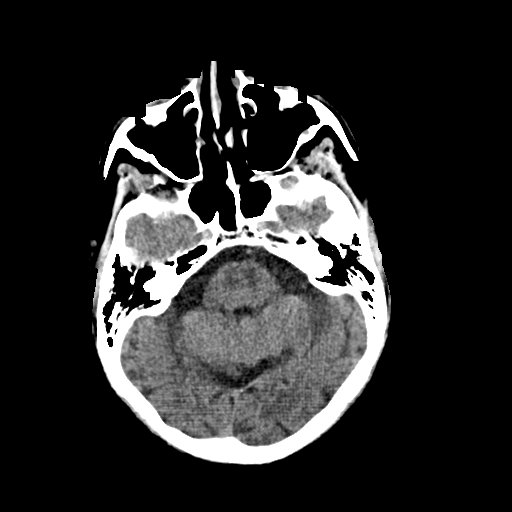
[im 11/36  bone]
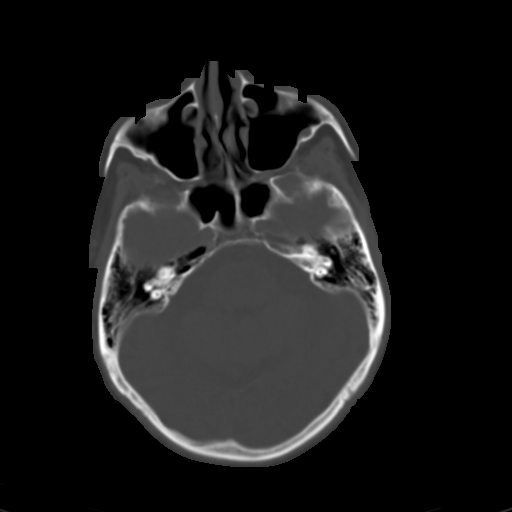
[im 14/36  brain]
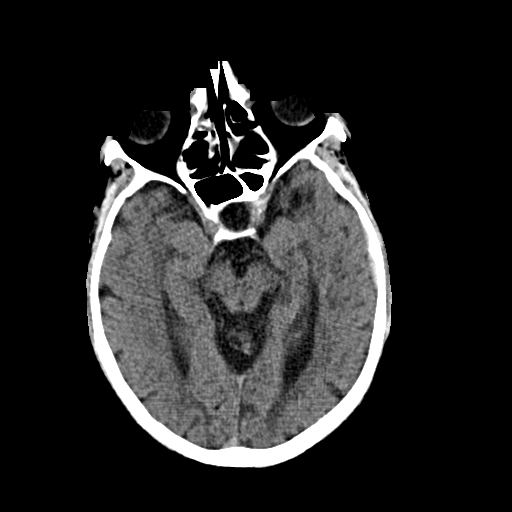
[im 16/36  brain]
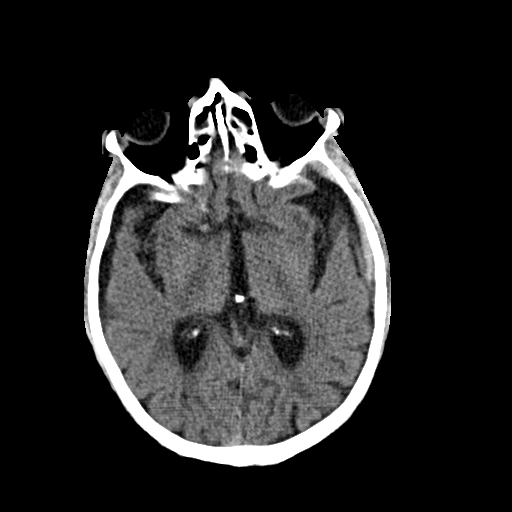
[im 19/36  brain]
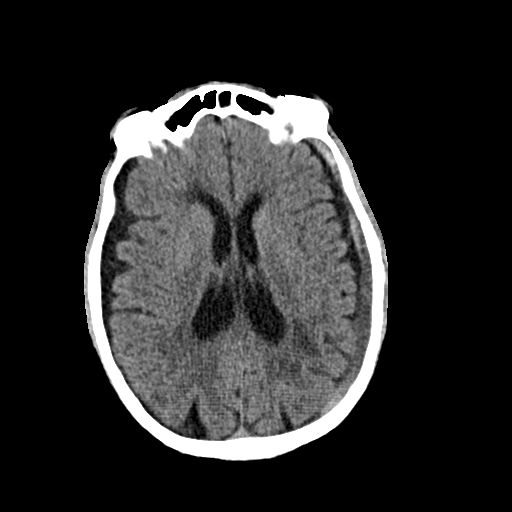
[im 20/36  brain]
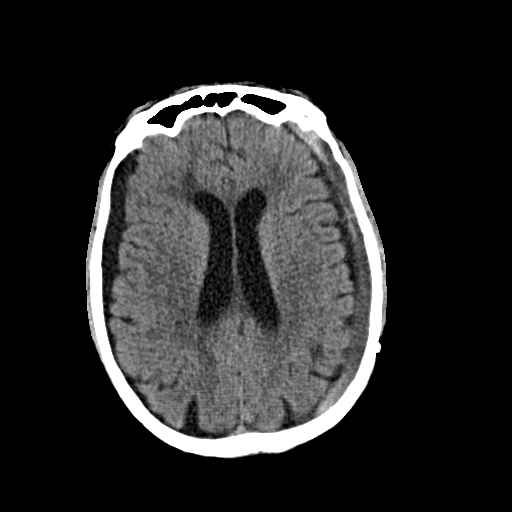
[im 20/36  bone]
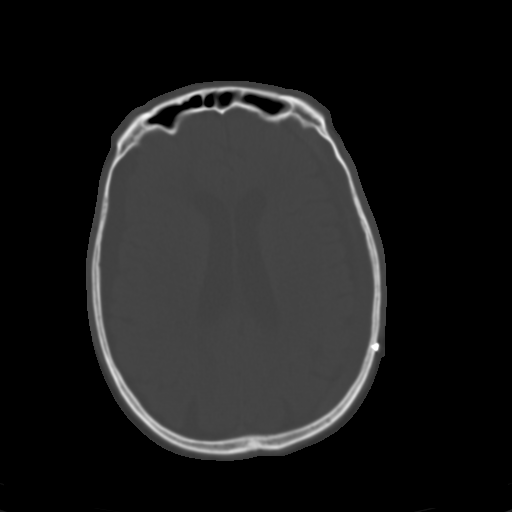
[im 22/36  brain]
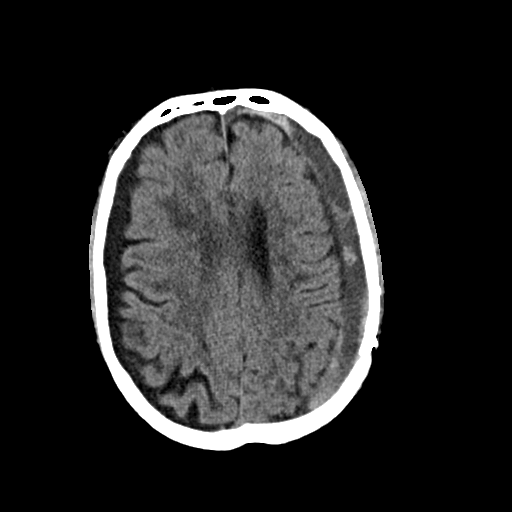
[im 25/36  brain]
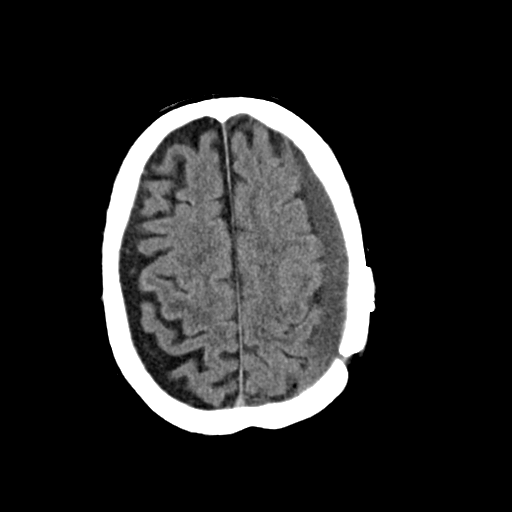
[im 27/36  brain]
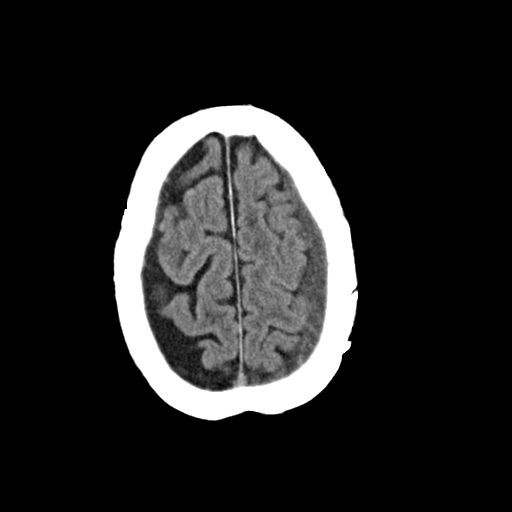
[im 29/36  brain]
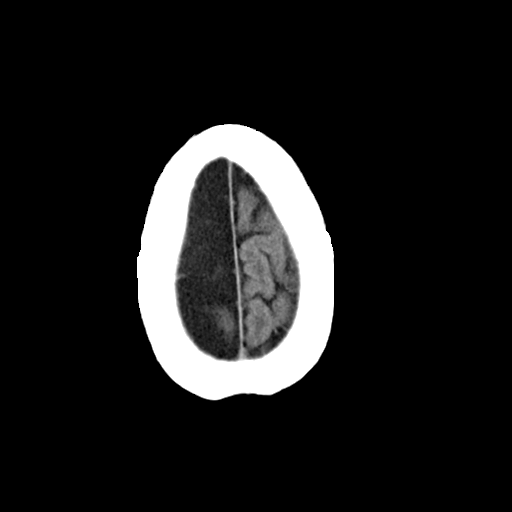
[im 29/36  bone]
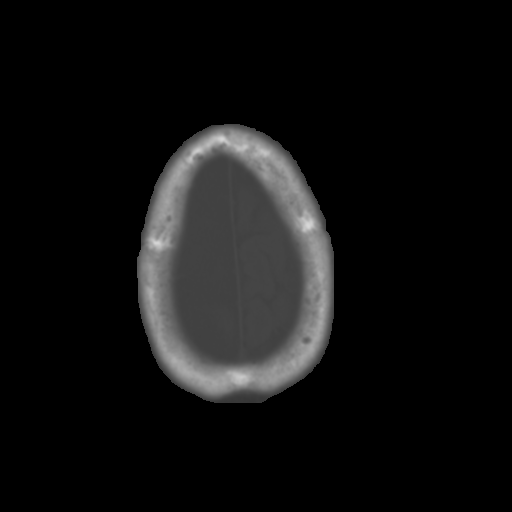
[im 32/36  brain]
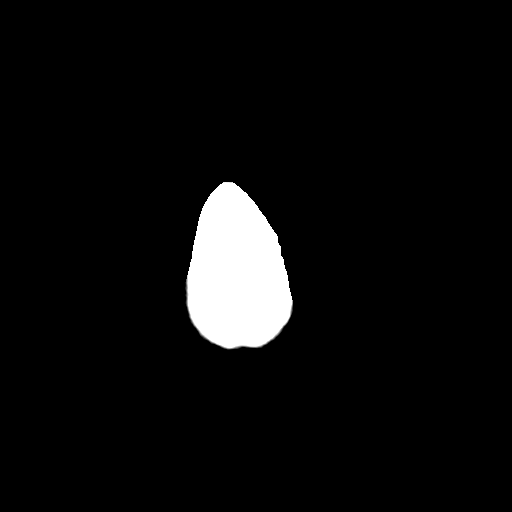
[im 34/36  brain]
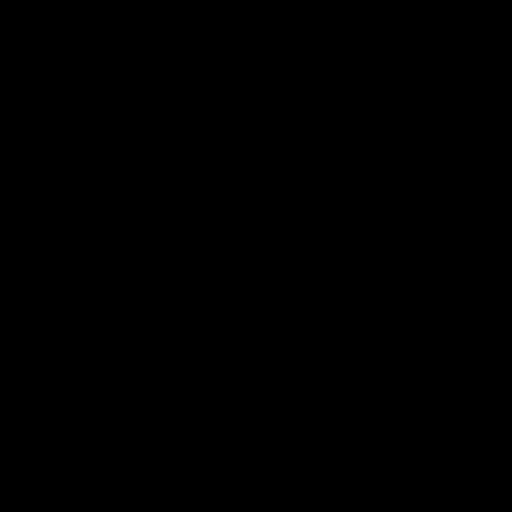

[15 of 30 positions shown; findings below may reference images not displayed]

FINDINGS: Brain: There is again noted a left-sided subdural hematoma which
measures approximately 1.1 cm in greatest dimension. The overall
density of the collection has increased in the interval from the
prior exam with patchy areas of hyperdense material consistent with
more acute hemorrhage. These changes are new from the prior exam.
The small epidural component deep to the cranioplasty on the left is
stable in appearance. Stable right-sided subdural hygroma is noted.
Diffuse atrophic changes are seen with chronic white matter ischemic
changes identified. The overall appearance is stable from the prior
exam.

Vascular: No hyperdense vessel or unexpected calcification.

Skull: Left-sided cranioplasty

Sinuses/Orbits: No acute finding.

Other: None.
IMPRESSION: Stable size of left-sided subdural hematoma although some interval
increase in density is noted with patchy areas of increased
attenuation consistent with acute hemorrhage. Small epidural
component remains stable.

Stable right sided subdural hygroma.

Stable atrophic and chronic white matter ischemic changes.

These results will be called to the ordering clinician or
representative by the Radiologist Assistant, and communication
documented in the PACS or zVision Dashboard.

## 2019-09-26 NOTE — Telephone Encounter (Signed)
Copied from [place] [phone]. Topic: General - Other >> Sep 26, 2019 11:43 AM Pauline Good wrote: Reason for CRM: pt's daughter is calling and want to know if the appt for Wednesday 10.7.20 can be in office because pt need to get his ears clean also

## 2019-09-26 NOTE — Telephone Encounter (Signed)
Okay in person visit as long as he pass the COVID-19 screen

## 2019-09-26 NOTE — Telephone Encounter (Signed)
FYI

## 2019-09-26 NOTE — Telephone Encounter (Signed)
Please advise- you had mentioned virtual visit in your notes.

## 2019-09-28 ENCOUNTER — Other Ambulatory Visit: Payer: Self-pay

## 2019-09-28 ENCOUNTER — Encounter: Payer: Self-pay | Admitting: Internal Medicine

## 2019-09-28 ENCOUNTER — Ambulatory Visit (INDEPENDENT_AMBULATORY_CARE_PROVIDER_SITE_OTHER): Payer: Medicare HMO | Admitting: Internal Medicine

## 2019-09-28 VITALS — BP 147/69 | HR 61 | Temp 97.2°F | Resp 16 | Ht 74.0 in | Wt 180.0 lb

## 2019-09-28 DIAGNOSIS — F0280 Dementia in other diseases classified elsewhere without behavioral disturbance: Secondary | ICD-10-CM

## 2019-09-28 DIAGNOSIS — I10 Essential (primary) hypertension: Secondary | ICD-10-CM

## 2019-09-28 DIAGNOSIS — G3109 Other frontotemporal dementia: Secondary | ICD-10-CM | POA: Diagnosis not present

## 2019-09-28 DIAGNOSIS — R69 Illness, unspecified: Secondary | ICD-10-CM | POA: Diagnosis not present

## 2019-09-28 DIAGNOSIS — F0391 Unspecified dementia with behavioral disturbance: Secondary | ICD-10-CM | POA: Diagnosis not present

## 2019-09-28 DIAGNOSIS — I6200 Nontraumatic subdural hemorrhage, unspecified: Secondary | ICD-10-CM | POA: Diagnosis not present

## 2019-09-28 NOTE — Progress Notes (Signed)
Pre visit review using our clinic review tool, if applicable. No additional management support is needed unless otherwise documented below in the visit note. 

## 2019-09-28 NOTE — Progress Notes (Signed)
Subjective:    Patient ID: Lonnie [name], male    DOB: Jan 23, 1951, 68 y.o.   MRN: [id number]  DOS:  09/28/2019 Type of visit - description: Follow-up, here with his daughter Dementia with behavioral issues: The patient continue withHadley Pen behavioral issues, they fluctuate throughout the day. This morning, he was yelling, angry, combative. This afternoon today he is very calm and cooperative.  He is particularly different with his wife, he yells at her and tries to intimidate her. Nevertheless, overall, Seroquel is making a difference.  HTN: Normal ambulatory BPs at home  Complaining of fluid in the ears and legs and check.   Review of Systems   Past Medical History:  Diagnosis Date  . Allergy   . CAD (coronary artery disease)    a. anterior STEMI 10/2013 s/p 4V CABG with LIMA to mid LAD, SVG to OM, SVG to PDA, SVG to Diagonal.  . Hypertension   . Ischemic cardiomyopathy    a. EF 40-45% at time of CABG and in 2018.  . MI (myocardial infarction) (HCC)   . Prediabetes 09/01/2014  . PVD (peripheral vascular disease) (HCC)    a. s/p L SFA stents with now known bilateral SFA occlusion treated medically.  . Tobacco abuse     Past Surgical History:  Procedure Laterality Date  . ABDOMINAL AORTAGRAM N/A 06/07/2014   Procedure: ABDOMINAL Ronny FlurryAORTAGRAM;  Surgeon: Iran OuchMuhammad A Arida, MD;  Location: [facility]ATH [facility];  Service: Cardiovascular;  Laterality: N/A;  . ABDOMINAL AORTAGRAM N/A 03/07/2015   Procedure: ABDOMINAL Ronny FlurryAORTAGRAM;  Surgeon: Iran OuchMuhammad A Arida, MD;  Location: [facility]ATH [facility];  Service: Cardiovascular;  Laterality: N/A;  . APPENDECTOMY    . COLONOSCOPY WITH PROPOFOL N/A 10/11/2015   Procedure: COLONOSCOPY WITH PROPOFOL;  Surgeon: Charna ElizabethJyothi Mann, MD;  Location: [facility];  Service: Endoscopy;  Laterality: N/A;  . CORONARY ARTERY BYPASS GRAFT N/A 11/04/2013   Procedure: CORONARY ARTERY BYPASS GRAFTING (CABG) TIMES FOUR  USING LEFT INTERNAL MAMMARY ARTERY AND RIGHT AND LEFT SAPHENOUS LEG VEIN  HARVESTED ENDOSCOPICALLY;  Surgeon: Loreli SlotSteven C Hendrickson, MD;  [facility];  Service: Open Heart Surgery;  Laterality: N/A;  . CRANIOTOMY Left 06/30/2019   Procedure: Frontal CRANIOTOMY HEMATOMA EVACUATION SUBDURAL;  Surgeon: Lisbeth RenshawNundkumar, Neelesh, MD;  Location: [facility] OR;  Service: Neurosurgery;  Laterality: Left;  Frontal CRANIOTOMY HEMATOMA EVACUATION SUBDURAL  . FINGER SURGERY  2017   injury  . KNEE SURGERY     fractured patella  . LEFT HEART CATH N/A 10/29/2013   Procedure: LEFT HEART CATH;  Surgeon: Kathleene Hazelhristopher D McAlhany, MD;  [facility];  Service: Cardiovascular;  Laterality: N/A;  . LEFT HEART CATHETERIZATION WITH CORONARY ANGIOGRAM N/A 10/31/2013   Procedure: LEFT HEART CATHETERIZATION WITH CORONARY ANGIOGRAM;  Surgeon: Kathleene Hazelhristopher D McAlhany, MD;  [facility];  Service: Cardiovascular;  Laterality: N/A;  . MOUTH SURGERY    . TOE SURGERY      Social History   Socioeconomic History  . Marital status: Married    Spouse name: [name]  . Number of children: 1  . Years of education: Not on file  . Highest education level: Not on file  Occupational History  . 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 Financial resource strain: Not on file  . Food insecurity    Worry: Not on file    Inability: Not on file  . Transportation needs    Medical: Not on file    Non-medical:  Not on file  Tobacco Use  . Smoking status: Current Every Day Smoker    Packs/day: 0.25    Years: 30.00    Pack years: 7.50    Types: Cigarettes  . Smokeless tobacco: Never Used  . Tobacco comment: < 1/2 ppd  Substance and Sexual Activity  . Alcohol use: No    Alcohol/week: 0.0 standard drinks  . Drug use: No  . Sexual activity: Yes  Lifestyle  . Physical activity    Days per week: Not on file    Minutes per session: Not on file  . Stress: Not on file  Relationships  . Social Herbalist on phone: Not on file    Gets together:  Not on file    Attends religious service: Not on file    Active member of club or organization: Not on file    Attends meetings of clubs or organizations: Not on file    Relationship status: Not on file  . Intimate partner violence    Fear of current or ex partner: Not on file    Emotionally abused: Not on file    Physically abused: Not on file    Forced sexual activity: Not on file  Other Topics Concern  . Not on file  Social History Narrative   Household-- pt and wife   1 daughter       Allergies as of 09/28/2019      Reactions   Ace Inhibitors Swelling   Angioedema   Dairy Aid [lactase]    gas   Eggs Or Egg-derived Products    Cannot eat Prepared Eggs   Peanut-containing Drug Products Nausea And Vomiting   Does not know which nuts, but states nuts make him vomit      Medication List       Accurate as of September 28, 2019 11:59 PM. If you have any questions, ask your nurse or doctor.        acetaminophen 325 MG tablet Commonly known as: TYLENOL Take 2 tablets (650 mg total) by mouth 4 (four) times daily -  before meals and at bedtime.   atorvastatin 80 MG tablet Commonly known as: LIPITOR TAKE 1 TABLET BY MOUTH  DAILY AT 6 PM What changed:   how much to take  how to take this  when to take this  additional instructions   azelastine 0.1 % nasal spray Commonly known as: ASTELIN Place 2 sprays into both nostrils at bedtime as needed for rhinitis or allergies. Use in each nostril as directed What changed: additional instructions   Calcium 600+D 600-800 MG-UNIT Tabs Generic drug: Calcium Carb-Cholecalciferol Take 1 tablet by mouth daily.   clonazePAM 0.25 MG disintegrating tablet Commonly known as: KLONOPIN TAKE 1 TABLET (0.25 MG TOTAL) BY MOUTH 2 (TWO) TIMES DAILY.   docusate sodium 100 MG capsule Commonly known as: COLACE Take 1 capsule (100 mg total) by mouth 2 (two) times daily.   fluticasone 50 MCG/ACT nasal spray Commonly known as: FLONASE  Place 2 sprays into both nostrils daily.   gabapentin 100 MG capsule Commonly known as: NEURONTIN Take 1 capsule (100 mg total) by mouth 2 (two) times daily.   irbesartan 300 MG tablet Commonly known as: AVAPRO Take 1 tablet (300 mg total) by mouth daily.   lidocaine 5 % Commonly known as: LIDODERM Place 1 patch onto the skin daily. Remove & Discard patch within 12 hours or as directed by MD   loratadine 10 MG tablet  Commonly known as: CLARITIN Take 1 tablet (10 mg total) by mouth daily.   metoprolol tartrate 25 MG tablet Commonly known as: LOPRESSOR Take 1 tablet (25 mg total) by mouth 2 (two) times daily.   nicotine 14 mg/24hr patch Commonly known as: NICODERM CQ - dosed in mg/24 hours Place 1 patch (14 mg total) onto the skin daily.   pantoprazole 40 MG tablet Commonly known as: PROTONIX TAKE 1 TABLET BY MOUTH EVERYDAY AT BEDTIME   QUEtiapine 50 MG tablet Commonly known as: SEROQUEL Take 3 tablets (150 mg total) by mouth 2 (two) times daily.   tamsulosin 0.4 MG Caps capsule Commonly known as: FLOMAX Take 2 capsules (0.8 mg total) by mouth daily after supper.           Objective:   Physical Exam BP (!) 147/69 (BP Location: Right Arm, Patient Position: Sitting, Cuff Size: Normal)   Pulse 61   Temp (!) 97.2 F (36.2 C) (Temporal)   Resp 16   Ht 6\' 2"  (1.88 m)   Wt 180 lb (81.6 kg) Comment: per Pt  SpO2 100%   BMI 23.11 kg/m  General:   Well developed, sits in a wheelchair. HEENT:  Normocephalic . Face symmetric, atraumatic.  Ears normal, TMs normal, minimal wax Lungs:  CTA B Normal respiratory effort, no intercostal retractions, no accessory muscle use. Heart: RRR,  no murmur.  No pretibial edema bilaterally  Skin: Not pale. Not jaundice Neurologic:  alert & oriented X3. MMSE: 28 ( failing to copy a figure, did not remember the day of the week) Speech normal, gait: Sitting in a wheelchair, not tested, the daughter reports he is very frail. Psych--    Cooperative; attention span slightly decreased and needs to be reoriented Displays obsessive behavior (for instance asking his ears to be clean) Slightly anxious but not depressed appearing.      Assessment    Assessment Prediabetes  dx 08-2014 HTN Anxiety: per pt, see OV 09/10/2019 CV: --CAD, MI , cath 10-2013 ---> CABG --Ischemic cardiomyopathy --Peripheral vascular disease Tobacco abuse -- intolerant to chantix before, tried Wellbutrin 2016 (not much help) Onychomycosis-- rx lamisil per podiatry 12-2015 Frontotemporal dementia dx 06/2019 but sx started gradually long before  Subdural hematoma, s/p craniotomy: admited 06/2019   PLAN: Subdural hematoma: Saw neurosurgery recently, he was told stable. Frontotemporal dementia, behavioral issues, recent TBI: He continues displaying behavioral issues.  They started before they TBI. Behavior includes being combative, yelling, trying to intimidate his wife.  His MMSE however is 28, no aphasia. Rx neurology referral to confirm diagnosis, he refused. Will continue with Seroquel which has helped a great deal, he also takes clonazepam and gabapentin. Check folic acid, B12, RPR HTN: BP today slightly elevated at home is very good, continue Avapro, metoprolol. Tobacco  abuse: Smoking up to 2 packs a day. Ear fluid: Ear exam is normal, he is very adamant about having fluid in the ears.  Recommend observation Had a flu shot already Follow-up 3 months

## 2019-09-28 NOTE — Patient Instructions (Signed)
GO TO THE LAB : Get the blood work     GO TO THE FRONT DESK Schedule your next appointment   for a checkup in 3 months    Check the  blood pressure weekly BP GOAL is between 110/65 and  135/85. If it is consistently higher or lower, let me know

## 2019-09-29 LAB — COMPREHENSIVE METABOLIC PANEL
ALT: 20 U/L (ref 0–53)
AST: 19 U/L (ref 0–37)
Albumin: 4.3 g/dL (ref 3.5–5.2)
Alkaline Phosphatase: 83 U/L (ref 39–117)
BUN: 19 mg/dL (ref 6–23)
CO2: 24 mEq/L (ref 19–32)
Calcium: 9.6 mg/dL (ref 8.4–10.5)
Chloride: 106 mEq/L (ref 96–112)
Creatinine, Ser: 0.99 mg/dL (ref 0.40–1.50)
GFR: 91.43 mL/min (ref >60.00)
Glucose, Bld: 102 mg/dL — ABNORMAL HIGH (ref 70–99)
Potassium: 4.3 mEq/L (ref 3.5–5.1)
Sodium: 140 mEq/L (ref 135–145)
Total Bilirubin: 0.4 mg/dL (ref 0.2–1.2)
Total Protein: 6.6 g/dL (ref 6.0–8.3)

## 2019-09-29 LAB — B12 AND FOLATE PANEL
Folate: 12.2 ng/mL (ref >5.9)
Vitamin B-12: 347 pg/mL (ref 211–911)

## 2019-09-29 LAB — RPR: RPR Ser Ql: NONREACTIVE

## 2019-09-29 IMAGING — CR LEFT SHOULDER - 2+ VIEW
2 series · 2 of 2 positions shown · non-contrast
Comparison: None.

CLINICAL DATA: Left shoulder pain

EXAM:
LEFT SHOULDER - 2+ VIEW

[shoulder grashey]
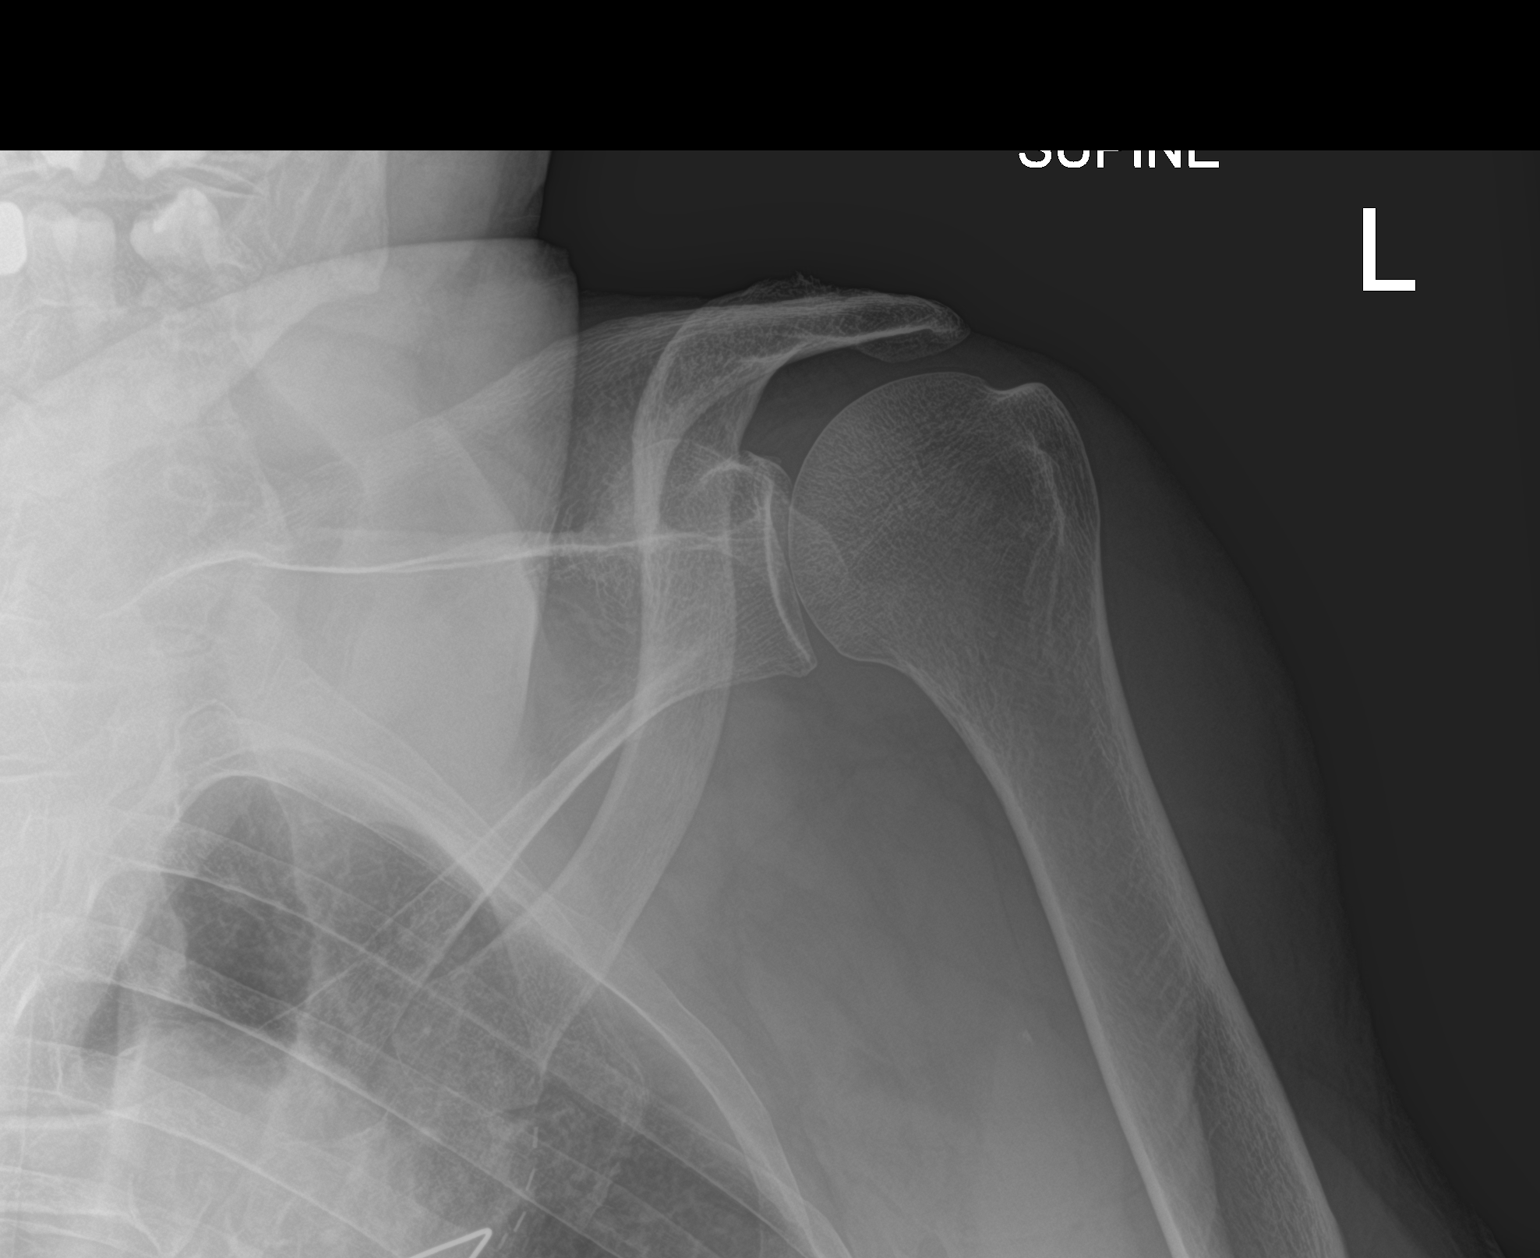

[shoulder y view]
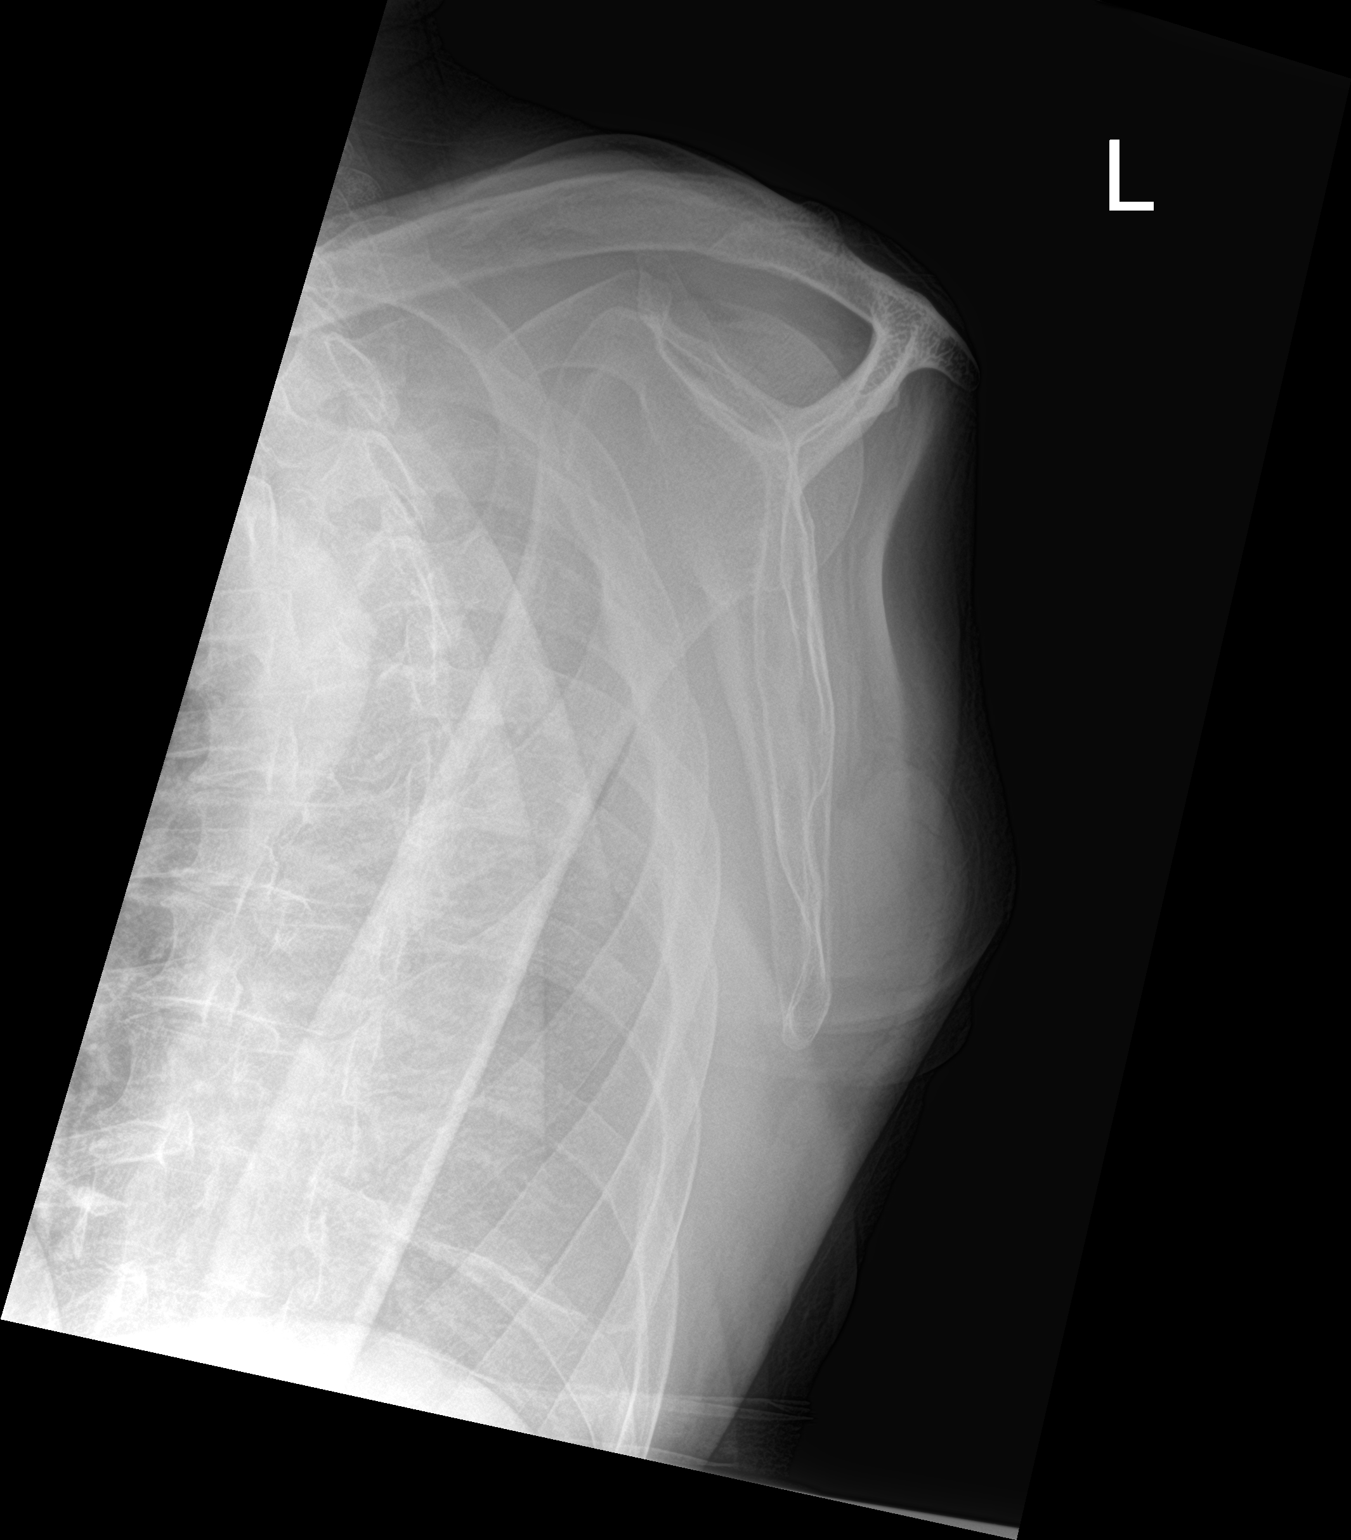

[2 of 2 positions shown; findings below may reference images not displayed]

FINDINGS: There is no evidence of fracture or dislocation. There is no
evidence of arthropathy or other focal bone abnormality. Soft
tissues are unremarkable.
IMPRESSION: Negative.

## 2019-10-02 NOTE — Assessment & Plan Note (Signed)
Subdural hematoma: Saw neurosurgery recently, he was told stable. Frontotemporal dementia, behavioral issues, recent TBI: He continues displaying behavioral issues.  They started before they TBI. Behavior includes being combative, yelling, trying to intimidate his wife.  His MMSE however is 28, no aphasia. Rx neurology referral to confirm diagnosis, he refused. Will continue with Seroquel which has helped a great deal, he also takes clonazepam and gabapentin. Check folic acid, M38, RPR HTN: BP today slightly elevated at home is very good, continue Avapro, metoprolol. Tobacco  abuse: Smoking up to 2 packs a day. Ear fluid: Ear exam is normal, he is very adamant about having fluid in the ears.  Recommend observation Had a flu shot already Follow-up 3 months

## 2019-10-04 ENCOUNTER — Other Ambulatory Visit: Payer: Self-pay | Admitting: Neurosurgery

## 2019-10-04 DIAGNOSIS — S065XAA Traumatic subdural hemorrhage with loss of consciousness status unknown, initial encounter: Secondary | ICD-10-CM

## 2019-10-04 DIAGNOSIS — S065X9A Traumatic subdural hemorrhage with loss of consciousness of unspecified duration, initial encounter: Secondary | ICD-10-CM

## 2019-10-06 ENCOUNTER — Encounter: Payer: Medicare HMO | Admitting: Physical Medicine & Rehabilitation

## 2019-10-13 ENCOUNTER — Other Ambulatory Visit: Payer: Self-pay | Admitting: Physical Medicine & Rehabilitation

## 2019-10-17 ENCOUNTER — Other Ambulatory Visit: Payer: Self-pay | Admitting: Physical Medicine and Rehabilitation

## 2019-10-17 NOTE — Telephone Encounter (Signed)
Needs to be refilled and managed by patients primary care provider. 

## 2019-10-31 ENCOUNTER — Encounter: Payer: Medicare HMO | Admitting: Physical Medicine & Rehabilitation

## 2019-11-03 ENCOUNTER — Ambulatory Visit: Payer: Medicare HMO | Admitting: Internal Medicine

## 2019-11-07 ENCOUNTER — Ambulatory Visit
Admission: RE | Admit: 2019-11-07 | Discharge: 2019-11-07 | Disposition: A | Discharge status: Home / Self Care | Payer: Medicare HMO | Source: Ambulatory Visit | Attending: Neurosurgery | Admitting: Neurosurgery

## 2019-11-07 ENCOUNTER — Other Ambulatory Visit: Payer: Self-pay

## 2019-11-07 DIAGNOSIS — I6200 Nontraumatic subdural hemorrhage, unspecified: Secondary | ICD-10-CM | POA: Diagnosis not present

## 2019-11-07 DIAGNOSIS — S065XAA Traumatic subdural hemorrhage with loss of consciousness status unknown, initial encounter: Secondary | ICD-10-CM

## 2019-11-07 DIAGNOSIS — S065X9A Traumatic subdural hemorrhage with loss of consciousness of unspecified duration, initial encounter: Secondary | ICD-10-CM

## 2019-11-07 IMAGING — CT CT HEAD W/O CM
2 series · 15 of 40 positions shown, 18 images · non-contrast
Comparison: CT head [DATE]

CLINICAL DATA: Subdural hematoma follow-up

EXAM:
CT HEAD WITHOUT CONTRAST
TECHNIQUE: Contiguous axial images were obtained from the base of the skull
through the vertex without intravenous contrast.

[Series 2: head w/(date) · axial · 0.49mm/px · z∈[-193,-43]mm · 12 of 36 slices shown, 15 images]
[im 3/36  brain]
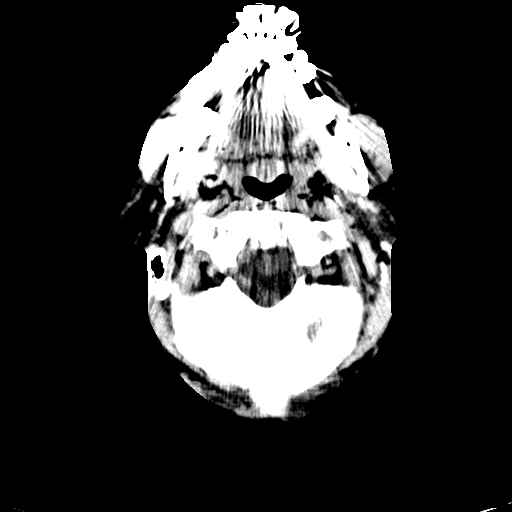
[im 3/36  bone]
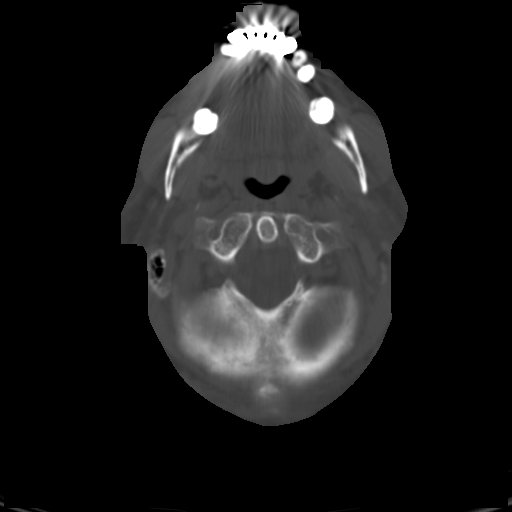
[im 5/36  brain]
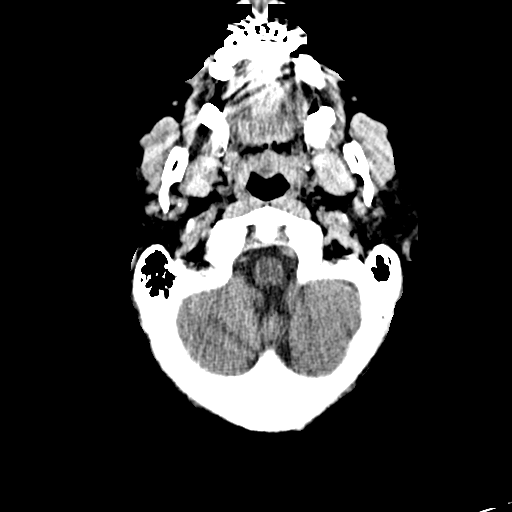
[im 8/36  brain]
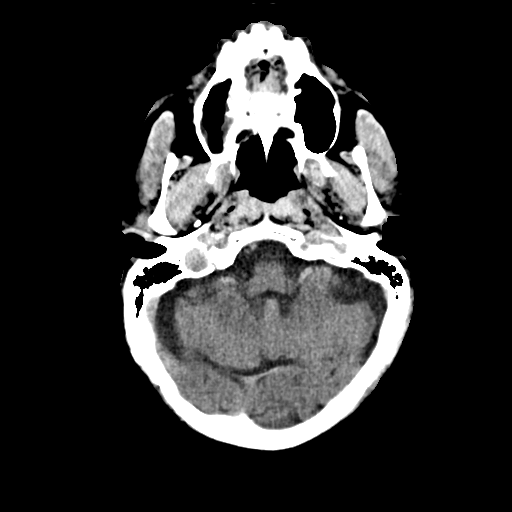
[im 11/36  brain]
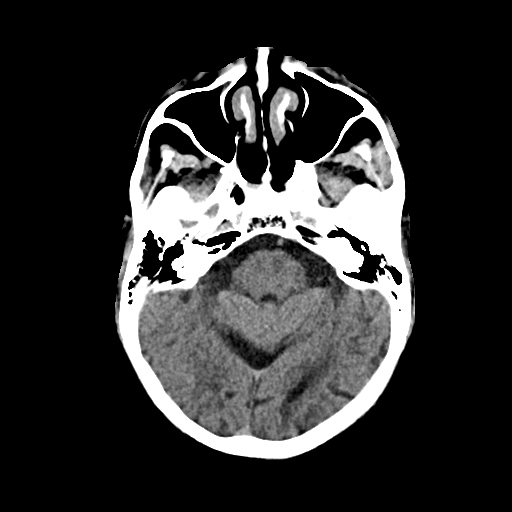
[im 14/36  brain]
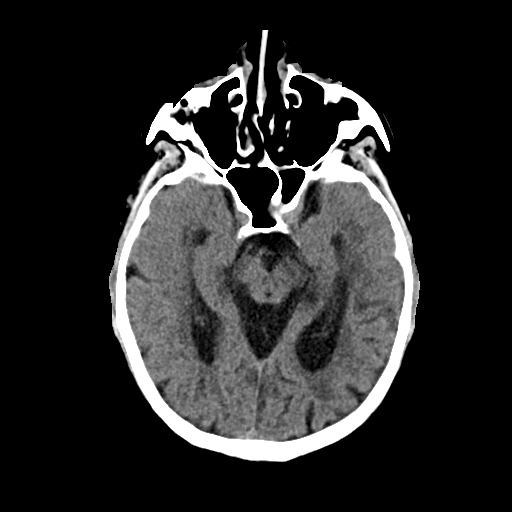
[im 14/36  bone]
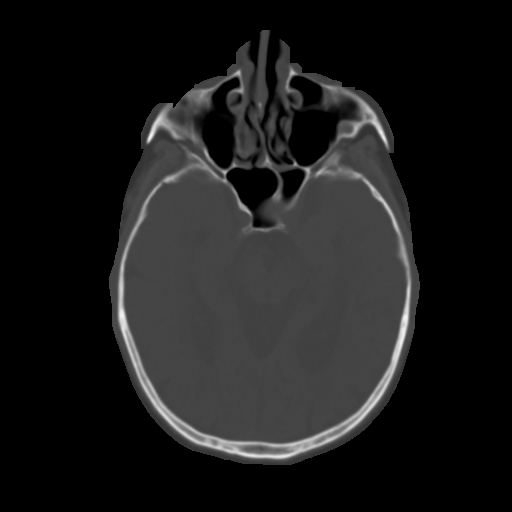
[im 16/36  brain]
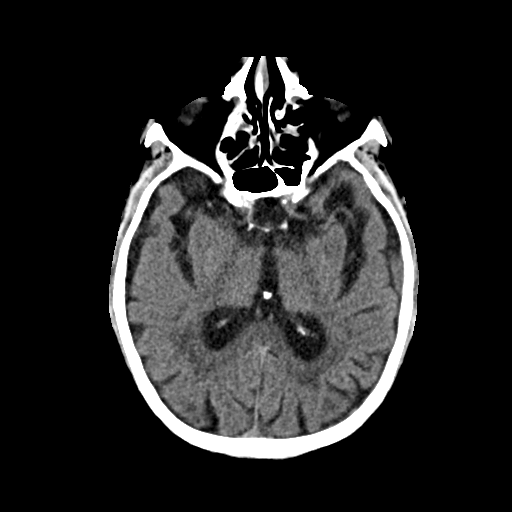
[im 20/36  brain]
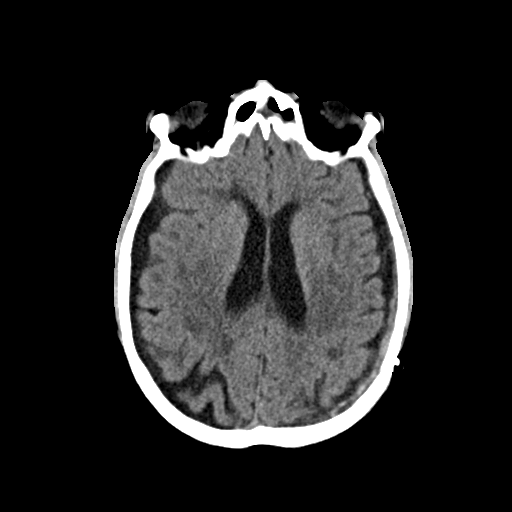
[im 22/36  brain]
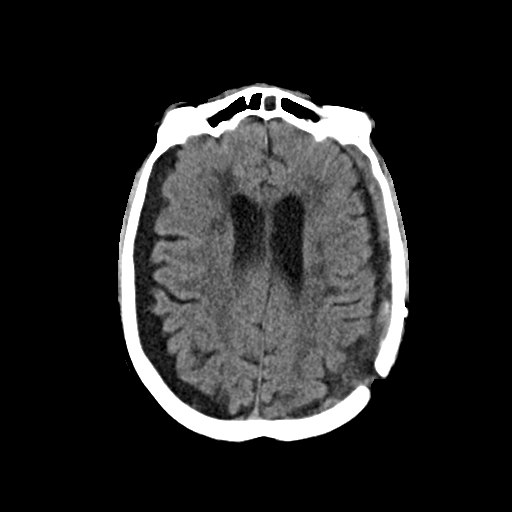
[im 25/36  brain]
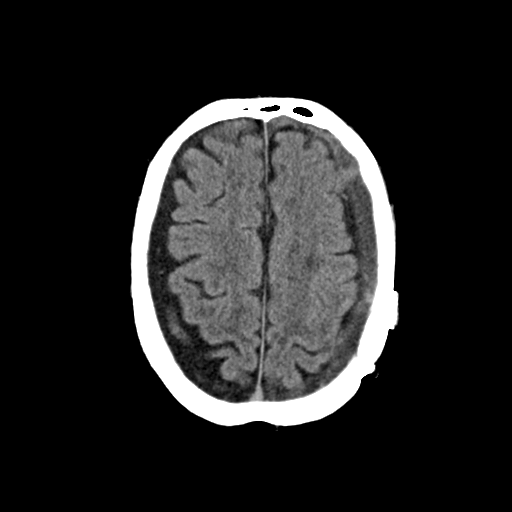
[im 25/36  bone]
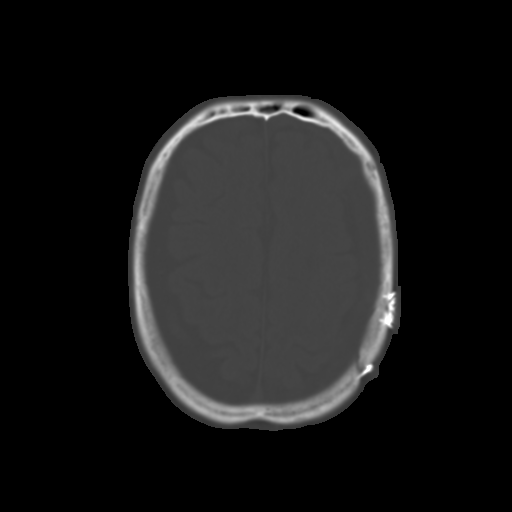
[im 28/36  brain]
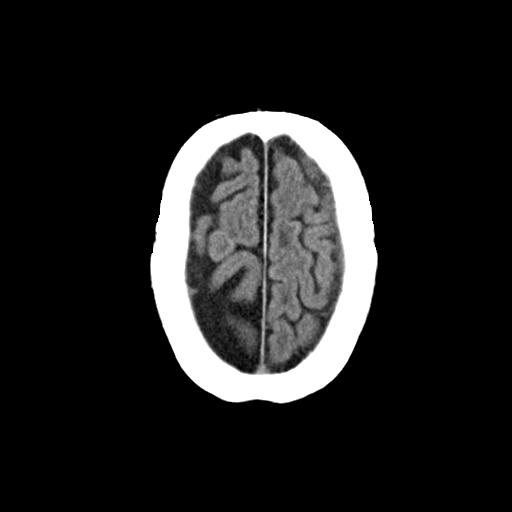
[im 31/36  brain]
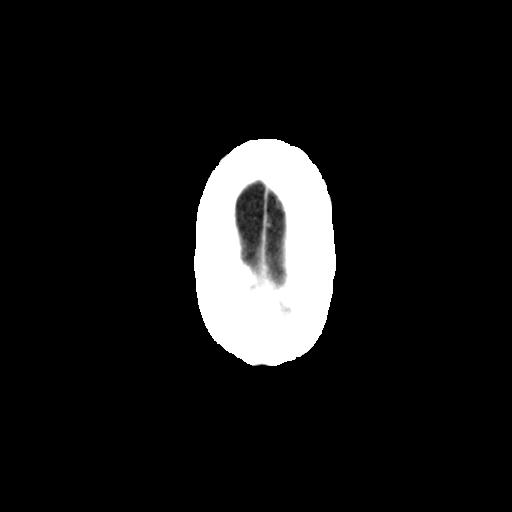
[im 33/36  brain]
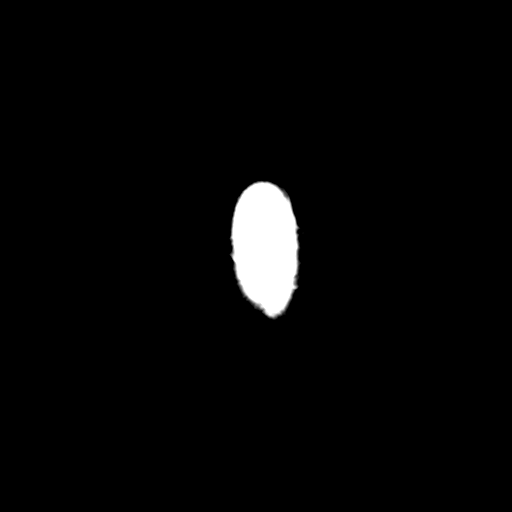

[Series 4: cor · coronal · 0.32mm/px · 3 of 80 slices shown]
[im 29/80  brain]
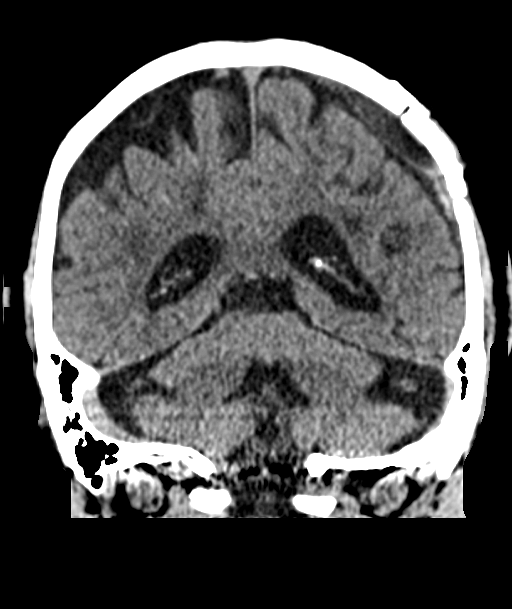
[im 36/80  brain]
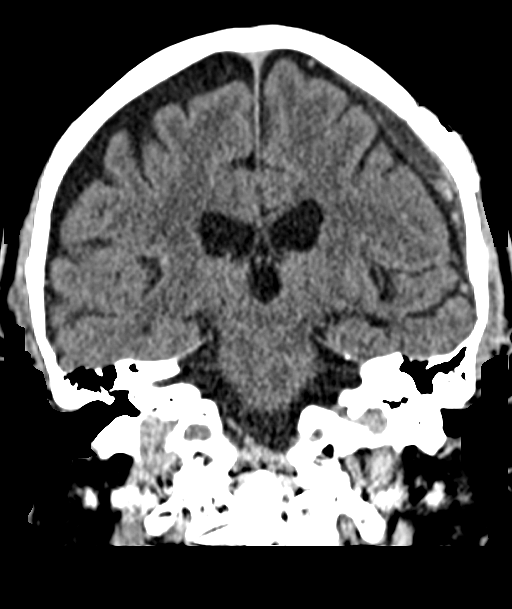
[im 44/80  brain]
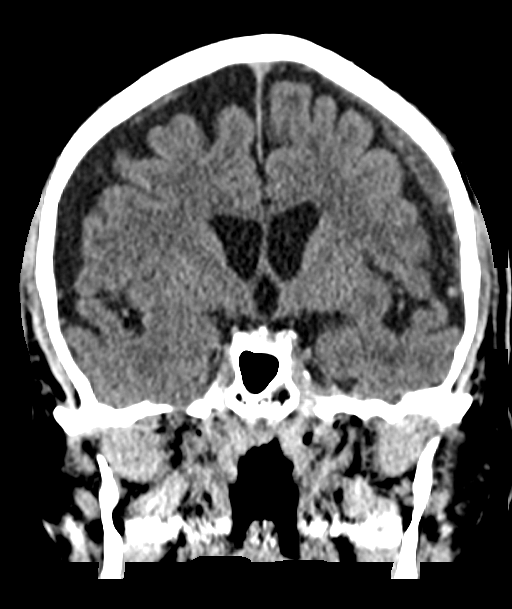

[15 of 40 positions shown; findings below may reference images not displayed]

FINDINGS: Brain: Moderate atrophy. Negative for hydrocephalus. Right-sided
subdural hygroma unchanged.

Mixed density subdural hematoma on the left is smaller. This now
measures 6.5 mm in thickness in the left frontal area compared with
10 mm previously. No new area of acute hemorrhage. No midline shift.

Negative for acute infarct. Moderate chronic microvascular ischemic
changes in the white matter.

Vascular: Negative for hyperdense vessel

Skull: Left parietal craniotomy without complication.

Sinuses/Orbits: Mucosal edema paranasal sinuses.  No orbital lesion.

Other: None
IMPRESSION: Subacute to chronic subdural hematoma on the left has improved in
size since [DATE]. No areas of acute hemorrhage

Right-sided subdural hygroma unchanged

Atrophy and chronic microvascular ischemic changes are stable.

## 2019-11-09 DIAGNOSIS — I6200 Nontraumatic subdural hemorrhage, unspecified: Secondary | ICD-10-CM | POA: Diagnosis not present

## 2019-11-10 ENCOUNTER — Other Ambulatory Visit: Payer: Self-pay | Admitting: Physical Medicine & Rehabilitation

## 2019-11-19 ENCOUNTER — Telehealth: Payer: Self-pay | Admitting: Internal Medicine

## 2019-11-19 ENCOUNTER — Other Ambulatory Visit: Payer: Self-pay | Admitting: Physical Medicine & Rehabilitation

## 2019-11-21 NOTE — Telephone Encounter (Signed)
Clonazepam refill.   Last OV: 09/28/2019 Last Fill: 09/19/2019 #60 and 1RF Pt sig: 1 tab bid  UDS: None

## 2019-11-21 NOTE — Telephone Encounter (Signed)
Sent!

## 2019-11-21 NOTE — Telephone Encounter (Signed)
Wants Dr Ethel Rana nurse to call [facility]. [phone] dad is getting dementia is getting worse, pacing and walking all nite, combative also drugstore denied clonazePAM (KLONOPIN) 0.25 MG disintegrating tablet Really just wants to touch base with his nurse

## 2019-12-02 ENCOUNTER — Telehealth: Payer: Self-pay | Admitting: Internal Medicine

## 2019-12-02 DIAGNOSIS — R451 Restlessness and agitation: Secondary | ICD-10-CM

## 2019-12-02 DIAGNOSIS — S069X0S Unspecified intracranial injury without loss of consciousness, sequela: Secondary | ICD-10-CM

## 2019-12-02 DIAGNOSIS — S065X9A Traumatic subdural hemorrhage with loss of consciousness of unspecified duration, initial encounter: Secondary | ICD-10-CM

## 2019-12-02 DIAGNOSIS — F0280 Dementia in other diseases classified elsewhere without behavioral disturbance: Secondary | ICD-10-CM

## 2019-12-02 DIAGNOSIS — S065XAA Traumatic subdural hemorrhage with loss of consciousness status unknown, initial encounter: Secondary | ICD-10-CM

## 2019-12-02 DIAGNOSIS — G3109 Other frontotemporal dementia: Secondary | ICD-10-CM

## 2019-12-02 DIAGNOSIS — I10 Essential (primary) hypertension: Secondary | ICD-10-CM

## 2019-12-02 NOTE — Telephone Encounter (Signed)
Spoke w/ [name], Pt's daughter, requesting a referral for CNA/nurse aide, they are using a [facility]- their number [phone] or [phone]. Pt needing help w/ ADLs such as shaving, baths. Coordinate care through [facility] [phone]. Referral placed.

## 2019-12-02 NOTE — Telephone Encounter (Signed)
Copied from [place] [phone]. Topic: General - Other >> Dec 02, 2019  4:02 PM Keene Breath wrote: Reason for CRM: Called to get a referral for in home home care.  Please call daughter to discuss at [phone]

## 2019-12-06 ENCOUNTER — Other Ambulatory Visit: Payer: Self-pay | Admitting: Internal Medicine

## 2019-12-08 ENCOUNTER — Other Ambulatory Visit: Payer: Self-pay | Admitting: Internal Medicine

## 2019-12-12 ENCOUNTER — Other Ambulatory Visit: Payer: Self-pay | Admitting: Internal Medicine

## 2019-12-12 ENCOUNTER — Other Ambulatory Visit: Payer: Self-pay

## 2019-12-12 DIAGNOSIS — S065XAA Traumatic subdural hemorrhage with loss of consciousness status unknown, initial encounter: Secondary | ICD-10-CM

## 2019-12-12 DIAGNOSIS — G3109 Other frontotemporal dementia: Secondary | ICD-10-CM

## 2019-12-12 DIAGNOSIS — I10 Essential (primary) hypertension: Secondary | ICD-10-CM

## 2019-12-12 DIAGNOSIS — F0280 Dementia in other diseases classified elsewhere without behavioral disturbance: Secondary | ICD-10-CM

## 2019-12-12 DIAGNOSIS — R451 Restlessness and agitation: Secondary | ICD-10-CM

## 2019-12-12 DIAGNOSIS — S069X0S Unspecified intracranial injury without loss of consciousness, sequela: Secondary | ICD-10-CM

## 2019-12-12 DIAGNOSIS — S065X9A Traumatic subdural hemorrhage with loss of consciousness of unspecified duration, initial encounter: Secondary | ICD-10-CM

## 2019-12-15 ENCOUNTER — Telehealth: Payer: Self-pay | Admitting: Internal Medicine

## 2019-12-15 NOTE — Telephone Encounter (Signed)
Copied from [place] [phone]. Topic: Quick Communication - See Telephone Encounter >> Dec 15, 2019  8:14 AM Loma Boston wrote: CRM for notification. See Telephone encounter for: 12/15/19.pls FU with daughter, [name], father has swollen feet this am bluish and has a spot on one of them, wanting a virtual with [place], office not picking up after numerous trys Call [phone]  [facility]- please call and triage.

## 2019-12-15 NOTE — Telephone Encounter (Signed)
Spoke w/ Pt's daughterSolmon Ice- she informed that her mom called worried that Pt's leg was swollen and bruised. Her mom then informed her that Pt had fallen the other night and to [facility] looked like he just bruised leg and was swollen from that. She will monitor over weekend and have him elevated feet. Informed that we can see him on Monday if bruising/swelling not improving or to take him to urgent care over weekend if not improving. [name] verbalized understanding.

## 2019-12-19 ENCOUNTER — Other Ambulatory Visit: Payer: Self-pay | Admitting: Physician Assistant

## 2019-12-19 ENCOUNTER — Other Ambulatory Visit: Payer: Self-pay | Admitting: Internal Medicine

## 2019-12-19 DIAGNOSIS — S065X9A Traumatic subdural hemorrhage with loss of consciousness of unspecified duration, initial encounter: Secondary | ICD-10-CM

## 2019-12-19 DIAGNOSIS — S065XAA Traumatic subdural hemorrhage with loss of consciousness status unknown, initial encounter: Secondary | ICD-10-CM

## 2019-12-21 ENCOUNTER — Other Ambulatory Visit: Payer: Self-pay | Admitting: Physician Assistant

## 2019-12-27 ENCOUNTER — Other Ambulatory Visit: Payer: Self-pay

## 2019-12-27 ENCOUNTER — Ambulatory Visit
Admission: RE | Admit: 2019-12-27 | Discharge: 2019-12-27 | Disposition: A | Discharge status: Home / Self Care | Payer: Medicare Other | Source: Ambulatory Visit | Attending: Physician Assistant | Admitting: Physician Assistant

## 2019-12-27 DIAGNOSIS — S065XAA Traumatic subdural hemorrhage with loss of consciousness status unknown, initial encounter: Secondary | ICD-10-CM

## 2019-12-27 DIAGNOSIS — S065X9A Traumatic subdural hemorrhage with loss of consciousness of unspecified duration, initial encounter: Secondary | ICD-10-CM

## 2019-12-27 IMAGING — CT CT HEAD W/O CM
1 series · 15 of 30 positions shown, 19 images · non-contrast
Comparison: Head CT [DATE]

CLINICAL DATA: Subdural hematoma. Additional history provided:
Subdural hematoma follow-up. Previous hematoma evacuation,
persistent balance issues

EXAM:
CT HEAD WITHOUT CONTRAST
TECHNIQUE: Contiguous axial images were obtained from the base of the skull
through the vertex without intravenous contrast.

[Series 2: head w/(date) · axial · 0.44mm/px · z∈[+663,+823]mm · 15 of 36 slices shown, 19 images]
[im 2/36  brain]
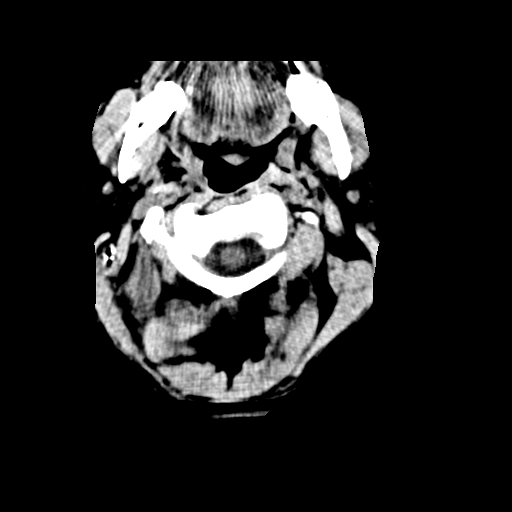
[im 2/36  bone]
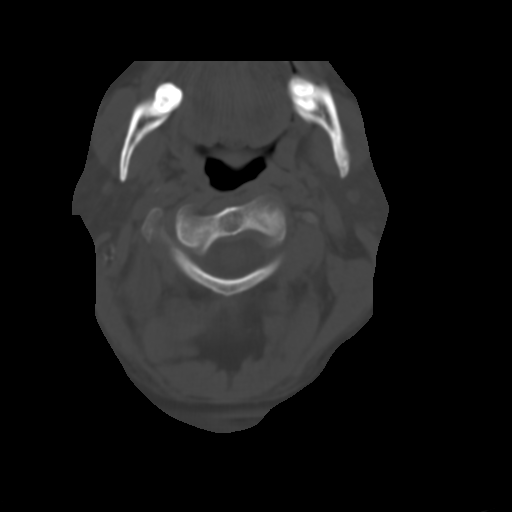
[im 4/36  brain]
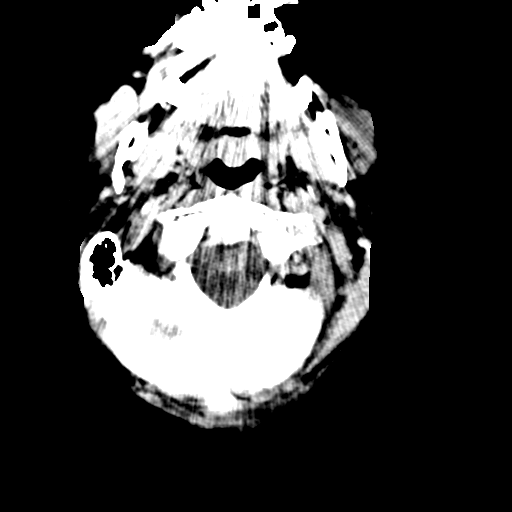
[im 7/36  brain]
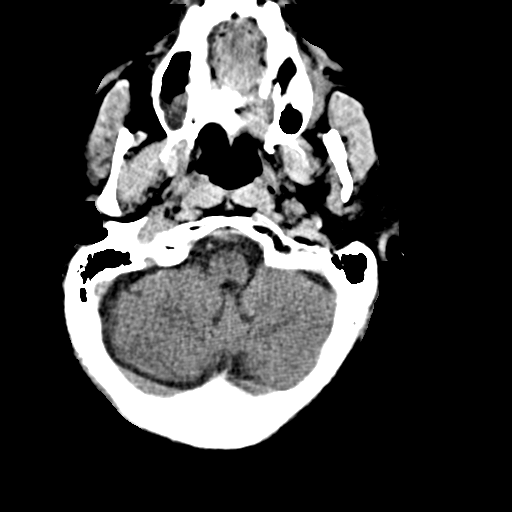
[im 9/36  brain]
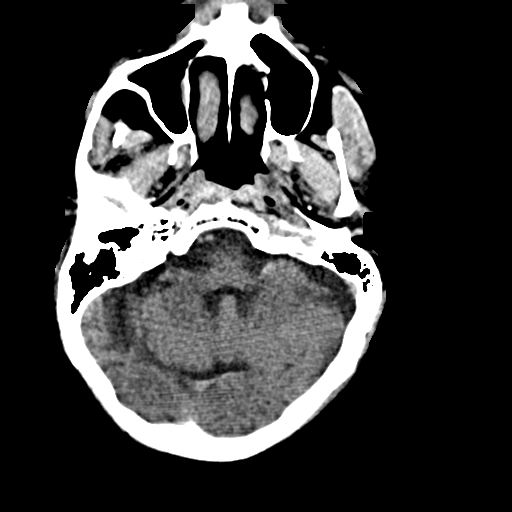
[im 11/36  brain]
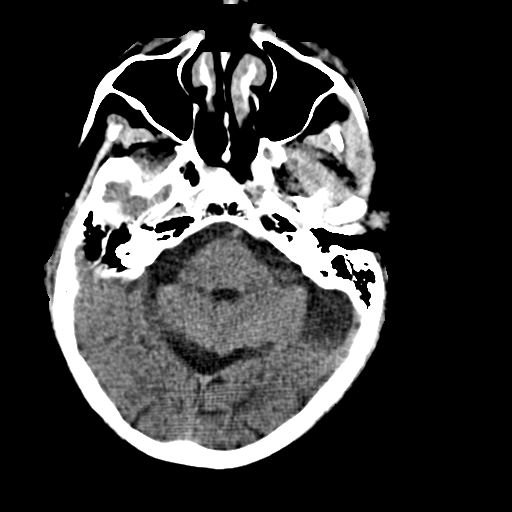
[im 11/36  bone]
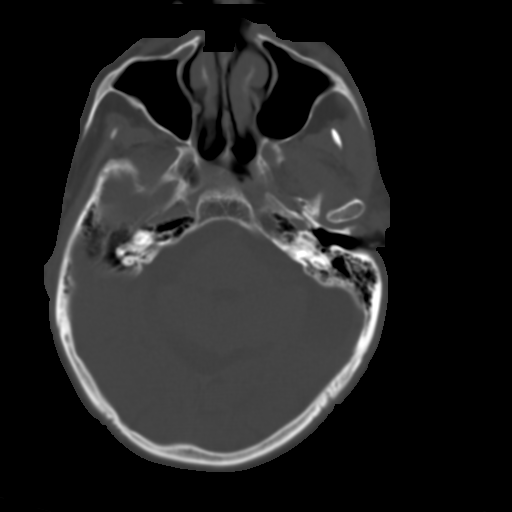
[im 14/36  brain]
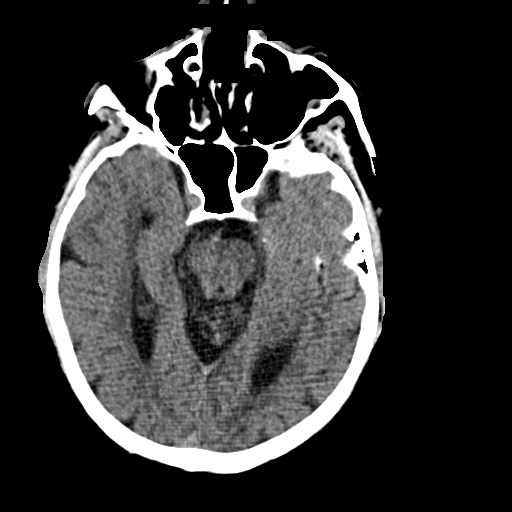
[im 16/36  brain]
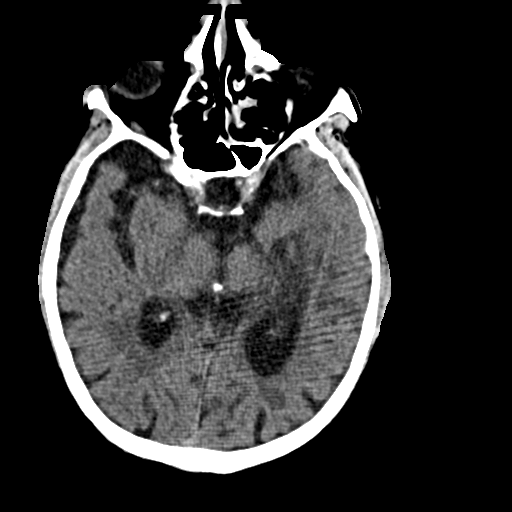
[im 19/36  brain]
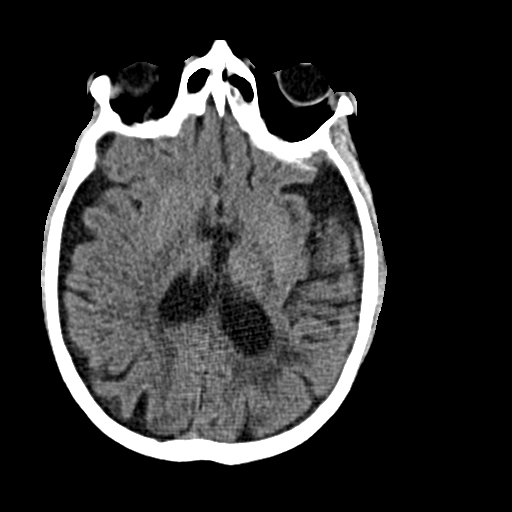
[im 20/36  brain]
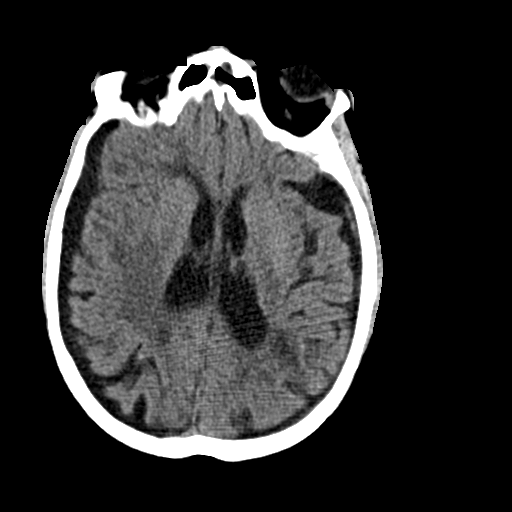
[im 20/36  bone]
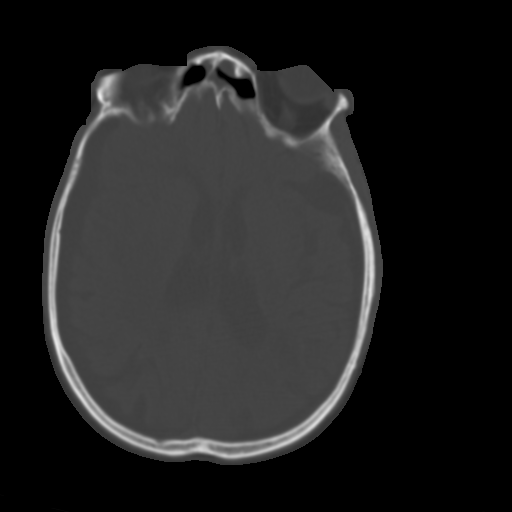
[im 22/36  brain]
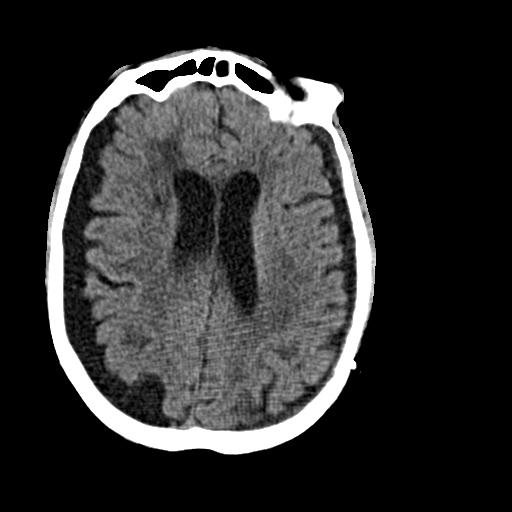
[im 25/36  brain]
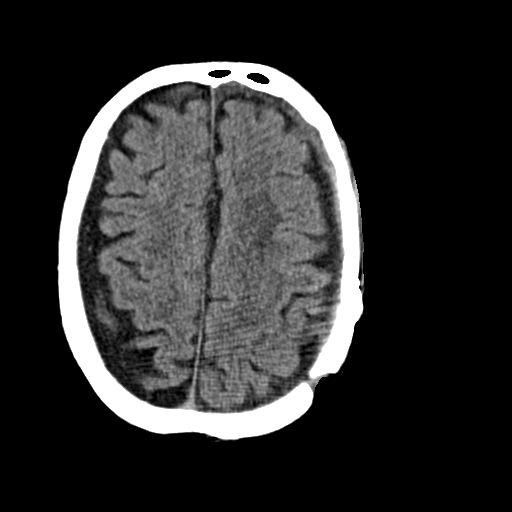
[im 27/36  brain]
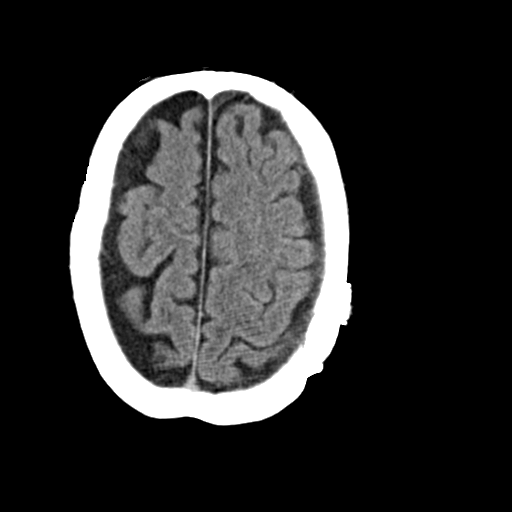
[im 29/36  brain]
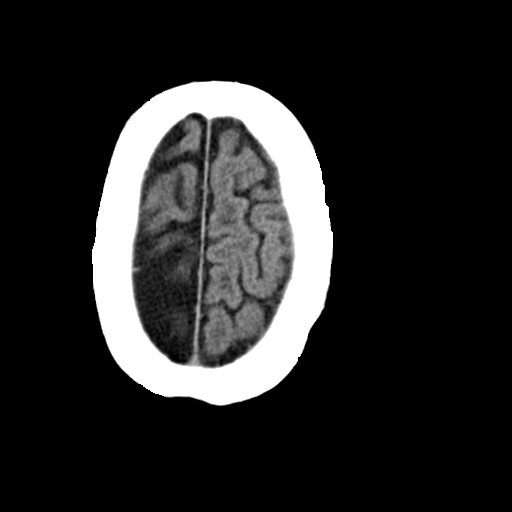
[im 29/36  bone]
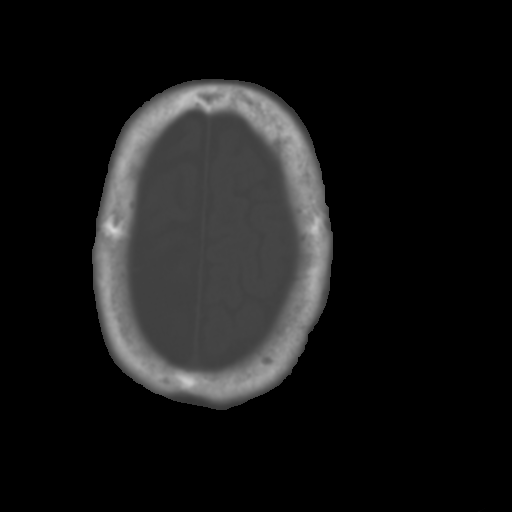
[im 32/36  brain]
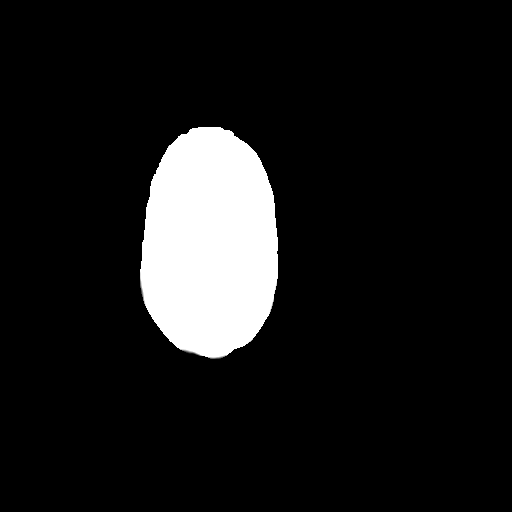
[im 34/36  brain]
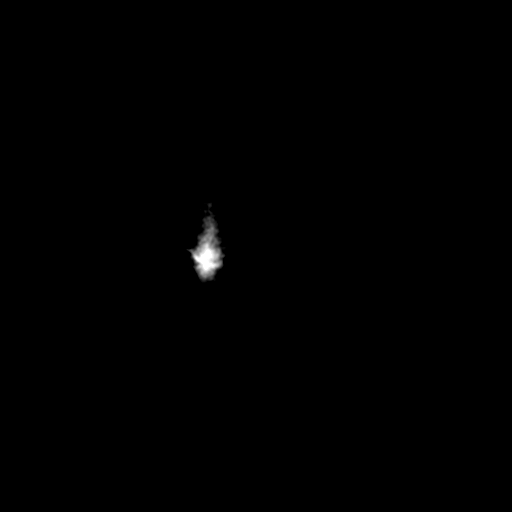

[15 of 30 positions shown; findings below may reference images not displayed]

FINDINGS: Brain:

Continued interval decrease in size of a now intermediate density
left convexity subdural hematoma. The hematoma now measures only 3-4
mm in greatest thickness on coronal reconstructions (previously 7
mm). No significant mass effect upon the underlying left cerebral
hemisphere. No midline shift. Grossly unchanged right-sided subdural
hygroma.

No evidence of acute infarct. No evidence of intracranial mass.
Stable moderate chronic small vessel ischemic disease and
generalized parenchymal atrophy.

Vascular: No hyperdense vessel.

Skull: Left parietal cranioplasty.  No calvarial fracture.

Sinuses/Orbits: Visualized orbits demonstrate no acute abnormality.
Moderate mucosal thickening within the left frontal and bilateral
ethmoid sinuses. Otherwise, no more than mild paranasal sinus
mucosal thickening. Small left maxillary sinus mucous retention
cyst.
IMPRESSION: Continued interval decrease in size of an intermediate density left
cerebral convexity subdural hematoma, now measuring 3-4 mm in
thickness (previously 7 mm). No significant mass effect upon the
underlying left cerebral hemisphere. No midline shift.

Grossly unchanged right-sided subdural hygroma.

Stable generalized parenchymal atrophy and chronic small vessel
ischemic disease.

Paranasal sinus disease as described.

## 2019-12-29 ENCOUNTER — Ambulatory Visit: Payer: Medicare HMO | Admitting: Internal Medicine

## 2019-12-30 ENCOUNTER — Ambulatory Visit (INDEPENDENT_AMBULATORY_CARE_PROVIDER_SITE_OTHER): Payer: Medicare Other | Admitting: Internal Medicine

## 2019-12-30 ENCOUNTER — Other Ambulatory Visit: Payer: Self-pay

## 2019-12-30 VITALS — BP 118/78

## 2019-12-30 DIAGNOSIS — F0280 Dementia in other diseases classified elsewhere without behavioral disturbance: Secondary | ICD-10-CM

## 2019-12-30 DIAGNOSIS — G3109 Other frontotemporal dementia: Secondary | ICD-10-CM

## 2019-12-30 DIAGNOSIS — R296 Repeated falls: Secondary | ICD-10-CM | POA: Diagnosis not present

## 2019-12-30 DIAGNOSIS — S069X0S Unspecified intracranial injury without loss of consciousness, sequela: Secondary | ICD-10-CM | POA: Diagnosis not present

## 2019-12-30 DIAGNOSIS — R269 Unspecified abnormalities of gait and mobility: Secondary | ICD-10-CM | POA: Diagnosis not present

## 2019-12-30 DIAGNOSIS — I10 Essential (primary) hypertension: Secondary | ICD-10-CM

## 2019-12-30 DIAGNOSIS — I2510 Atherosclerotic heart disease of native coronary artery without angina pectoris: Secondary | ICD-10-CM

## 2019-12-30 DIAGNOSIS — Z720 Tobacco use: Secondary | ICD-10-CM

## 2019-12-30 MED ORDER — ATORVASTATIN CALCIUM 80 MG PO TABS: ORAL_TABLET | ORAL | 3 refills | Status: DC

## 2019-12-30 NOTE — Progress Notes (Signed)
Subjective:    Patient ID: Lonnie [name], male    DOB: 13-Dec-1951, 69 y.o.   MRN: [id number]  DOS:  12/30/2019 Type of visit - description: Virtual Visit via Video Note  I connected with @NAME @ on @TODAY @ at @CHLAPPTTIME @ by a video enabled telemedicine application and verified that I am speaking with the correct person using two identifiers.   THIS ENCOUNTER IS A VIRTUAL VISIT DUE TO COVID-19 - PATIENT WAS NOT SEEN IN THE OFFICE. PATIENT HAS CONSENTED TO VIRTUAL VISIT / TELEMEDICINE VISIT   Location of patient: home, his daughter also participate  Location of provider: office  I discussed the limitations of evaluation and management by telemedicine and the availability of in person appointments. The patient expressed understanding and agreed to proceed.  Routine checkup I spoke with the patient and his daughter . We talk about his chronic medical problems including dementia, CAD, cardiomyopathy. Also, had 2 falls in the last 3 weeks, all within his house. In one of the falls he injured his right foot, the wound is healing nicely according to the daughter, " new tissue is growing", no discharge, fever chills, the skin around it is not red or swollen     Review of Systems Denies chest pain or difficulty breathing No nausea, vomiting.   Past Medical History:  Diagnosis Date  . Allergy   . CAD (coronary artery disease)    a. anterior STEMI 10/2013 s/p 4V CABG with LIMA to mid LAD, SVG to OM, SVG to PDA, SVG to Diagonal.  . Hypertension   . Ischemic cardiomyopathy    a. EF 40-45% at time of CABG and in 2018.  . MI (myocardial infarction) (HCC)   . Prediabetes 09/01/2014  . PVD (peripheral vascular disease) (HCC)    a. s/p L SFA stents with now known bilateral SFA occlusion treated medically.  . Tobacco abuse     Past Surgical History:  Procedure Laterality Date  . ABDOMINAL AORTAGRAM N/A 06/07/2014   Procedure: ABDOMINAL 2019;  Surgeon: 11/01/2014, MD;   Location: [facility] CATH LAB;  Service: Cardiovascular;  Laterality: N/A;  . ABDOMINAL AORTAGRAM N/A 03/07/2015   Procedure: ABDOMINAL Ronny Flurry;  Surgeon: Iran Ouch, MD;  Location: [facility] CATH LAB;  Service: Cardiovascular;  Laterality: N/A;  . APPENDECTOMY    . COLONOSCOPY WITH PROPOFOL N/A 10/11/2015   Procedure: COLONOSCOPY WITH PROPOFOL;  Surgeon: Ronny Flurry, MD;  Location: [facility] ENDOSCOPY;  Service: Endoscopy;  Laterality: N/A;  . CORONARY ARTERY BYPASS GRAFT N/A 11/04/2013   Procedure: CORONARY ARTERY BYPASS GRAFTING (CABG) TIMES FOUR  USING LEFT INTERNAL MAMMARY ARTERY AND RIGHT AND LEFT SAPHENOUS LEG VEIN HARVESTED ENDOSCOPICALLY;  Surgeon: 10/13/2015, MD;  Location[facility] OR;  Service: Open Heart Surgery;  Laterality: N/A;  . CRANIOTOMY Left 06/30/2019   Procedure: Frontal CRANIOTOMY HEMATOMA EVACUATION SUBDURAL;  Surgeon: Loreli Slot, MD;  Location: [facility] OR;  Service: Neurosurgery;  Laterality: Left;  Frontal CRANIOTOMY HEMATOMA EVACUATION SUBDURAL  . FINGER SURGERY  2017   injury  . KNEE SURGERY     fractured patella  . LEFT HEART CATH N/A 10/29/2013   Procedure: LEFT HEART CATH;  Surgeon: Lisbeth Renshaw, MD;  Location[facility] CATH LAB;  Service: Cardiovascular;  Laterality: N/A;  . LEFT HEART CATHETERIZATION WITH CORONARY ANGIOGRAM N/A 10/31/2013   Procedure: LEFT HEART CATHETERIZATION WITH CORONARY ANGIOGRAM;  Surgeon: Kathleene Hazel, MD;  Location: [facility] CATH LAB;  Service: Cardiovascular;  Laterality: N/A;  . MOUTH SURGERY    .  TOE SURGERY      Social History   Socioeconomic History  . Marital status: Married    Spouse name: [name]  . Number of children: 1  . Years of education: Not on file  . Highest education level: Not on file  Occupational History  . Occupation: long term disability --Futures trader: [employer]  Tobacco Use  . Smoking status: Current Every Day Smoker    Packs/day: 0.25    Years: 30.00    Pack years: 7.50    Types:  Cigarettes  . Smokeless tobacco: Never Used  . Tobacco comment: < 1/2 ppd  Substance and Sexual Activity  . Alcohol use: No    Alcohol/week: 0.0 standard drinks  . Drug use: No  . Sexual activity: Yes  Other Topics Concern  . Not on file  Social History Narrative   Household-- pt and wife   1 daughter    Social Determinants of Health   Financial Resource Strain:   . Difficulty of Paying Living Expenses: Not on file  Food Insecurity:   . Worried About Charity fundraiser in the Last Year: Not on file  . Ran Out of Food in the Last Year: Not on file  Transportation Needs:   . Lack of Transportation (Medical): Not on file  . Lack of Transportation (Non-Medical): Not on file  Physical Activity:   . Days of Exercise per Week: Not on file  . Minutes of Exercise per Session: Not on file  Stress:   . Feeling of Stress : Not on file  Social Connections:   . Frequency of Communication with Friends and Family: Not on file  . Frequency of Social Gatherings with Friends and Family: Not on file  . Attends Religious Services: Not on file  . Active Member of Clubs or Organizations: Not on file  . Attends Archivist Meetings: Not on file  . Marital Status: Not on file  Intimate Partner Violence:   . Fear of Current or Ex-Partner: Not on file  . Emotionally Abused: Not on file  . Physically Abused: Not on file  . Sexually Abused: Not on file      Allergies as of 12/30/2019      Reactions   Ace Inhibitors Swelling   Angioedema   Dairy Aid [lactase]    gas   Eggs Or Egg-derived Products    Cannot eat Prepared Eggs   Peanut-containing Drug Products Nausea And Vomiting   Does not know which nuts, but states nuts make him vomit      Medication List       Accurate as of December 30, 2019  1:56 PM. If you have any questions, ask your nurse or doctor.        acetaminophen 325 MG tablet Commonly known as: TYLENOL Take 2 tablets (650 mg total) by mouth 4 (four) times daily  -  before meals and at bedtime.   atorvastatin 80 MG tablet Commonly known as: LIPITOR TAKE 1 TABLET BY MOUTH  DAILY AT 6 PM What changed:   how much to take  how to take this  when to take this  additional instructions   azelastine 0.1 % nasal spray Commonly known as: ASTELIN Place 2 sprays into both nostrils at bedtime as needed for rhinitis or allergies. Use in each nostril as directed What changed: additional instructions   Calcium 600+D 600-800 MG-UNIT Tabs Generic drug: Calcium Carb-Cholecalciferol Take 1 tablet by mouth daily.  clonazePAM 0.25 MG disintegrating tablet Commonly known as: KLONOPIN TAKE 1 TABLET BY MOUTH TWICE A DAY   docusate sodium 100 MG capsule Commonly known as: COLACE Take 1 capsule (100 mg total) by mouth 2 (two) times daily.   fluticasone 50 MCG/ACT nasal spray Commonly known as: FLONASE Place 2 sprays into both nostrils daily.   gabapentin 100 MG capsule Commonly known as: NEURONTIN Take 1 capsule (100 mg total) by mouth 2 (two) times daily.   irbesartan 300 MG tablet Commonly known as: AVAPRO Take 1 tablet (300 mg total) by mouth daily.   lidocaine 5 % Commonly known as: LIDODERM Place 1 patch onto the skin daily. Remove & Discard patch within 12 hours or as directed by MD   loratadine 10 MG tablet Commonly known as: CLARITIN Take 1 tablet (10 mg total) by mouth daily.   metoprolol tartrate 25 MG tablet Commonly known as: LOPRESSOR Take 1 tablet (25 mg total) by mouth 2 (two) times daily.   nicotine 14 mg/24hr patch Commonly known as: NICODERM CQ - dosed in mg/24 hours Place 1 patch (14 mg total) onto the skin daily.   pantoprazole 40 MG tablet Commonly known as: PROTONIX TAKE 1 TABLET BY MOUTH EVERYDAY AT BEDTIME   QUEtiapine 50 MG tablet Commonly known as: SEROQUEL TAKE 3 TABLETS BY MOUTH TWICE A DAY   tamsulosin 0.4 MG Caps capsule Commonly known as: FLOMAX TAKE 2 CAPSULES BY MOUTH DAILY AFTER SUPPER            Objective:   Physical Exam Musculoskeletal:       Feet:    There were no vitals taken for this visit. This is a virtual video visit, the patient is alert, no apparent distress, sitting in his sofa He seems cooperative. No lower extremity edema noted. I was able to see his feet.  See graphic    Assessment     Assessment Prediabetes  dx 08-2014 HTN Hyperlipidemia Psych Anxiety: per pt, see OV 09/10/2019 Frontotemporal dementia dx 06/2019 but sx started gradually long before  CV: --CAD, MI , cath 10-2013 ---> CABG --Ischemic cardiomyopathy --Peripheral vascular disease Tobacco abuse -- intolerant to chantix before, tried Wellbutrin 2016 (not much help) Onychomycosis-- rx lamisil per podiatry 12-2015 Subdural hematoma, s/p craniotomy: admited 06/2019   PLAN: Prediabetes: Not on medications, recheck A1c when he comes back HTN: Reports excellent ambulatory BPs in the 120/80 range, continue  Avapro, metoprolol, BMP on RTC Anxiety, frontotemporal dementia, behavioral issues: Has good days and bad days but in general reasonably well control on clonazepam and Seroquel. Hyperlipidemia: Refill Lipitor CAD, ischemic cardiomyopathy: Continue metoprolol, afebrile, Lipitor.  No volume overloaded Wound, right foot: They know the limitations of the video visit, reportedly no evidence of infection & wounds looks better.  We agreed on ongoing local care, call if swelling redness or discharge Gait disorder: 2 recent falls in 3 weeks, family request PT: Will do. Subdural hematoma: [name] reports that her father went to see the neurologist, CT showed decreasing of the hematoma and essentially was told to return to the office as needed. Tobacco abuse: Still smoking, daughter does not desire further intervention or counseling.  "That is what he is going to do".  We won't  discuss  the issue anymore RTC 3 months in person, will do blood work at that time        I discussed the assessment and  treatment plan with the patient. The patient was provided an opportunity to ask questions and  all were answered. The patient agreed with the plan and demonstrated an understanding of the instructions.   The patient was advised to call back or seek an in-person evaluation if the symptoms worsen or if the condition fails to improve as anticipated.

## 2020-01-01 NOTE — Assessment & Plan Note (Signed)
Prediabetes: Not on medications, recheck A1c when he comes back HTN: Reports excellent ambulatory BPs in the 120/80 range, continue  Avapro, metoprolol, BMP on RTC Anxiety, frontotemporal dementia, behavioral issues: Has good days and bad days but in general reasonably well control on clonazepam and Seroquel. Hyperlipidemia: Refill Lipitor CAD, ischemic cardiomyopathy: Continue metoprolol, afebrile, Lipitor.  No volume overloaded Wound, right foot: They know the limitations of the video visit, reportedly no evidence of infection & wounds looks better.  We agreed on ongoing local care, call if swelling redness or discharge Gait disorder: 2 recent falls in 3 weeks, family request PT: Will do. Subdural hematoma: [name] reports that her father went to see the neurologist, CT showed decreasing of the hematoma and essentially was told to return to the office as needed. Tobacco abuse: Still smoking, daughter does not desire further intervention or counseling.  "That is what he is going to do".  We won't  discuss  the issue anymore RTC 3 months in person, will do blood work at that time

## 2020-01-03 ENCOUNTER — Other Ambulatory Visit: Payer: Self-pay | Admitting: Internal Medicine

## 2020-01-04 ENCOUNTER — Other Ambulatory Visit: Payer: Self-pay

## 2020-01-04 ENCOUNTER — Ambulatory Visit (INDEPENDENT_AMBULATORY_CARE_PROVIDER_SITE_OTHER): Payer: Medicare Other

## 2020-01-04 ENCOUNTER — Ambulatory Visit: Payer: Medicare Other | Admitting: Podiatry

## 2020-01-04 DIAGNOSIS — I739 Peripheral vascular disease, unspecified: Secondary | ICD-10-CM | POA: Diagnosis not present

## 2020-01-04 DIAGNOSIS — M79675 Pain in left toe(s): Secondary | ICD-10-CM | POA: Diagnosis not present

## 2020-01-04 DIAGNOSIS — I96 Gangrene, not elsewhere classified: Secondary | ICD-10-CM

## 2020-01-04 DIAGNOSIS — M79671 Pain in right foot: Secondary | ICD-10-CM | POA: Diagnosis not present

## 2020-01-04 DIAGNOSIS — L97512 Non-pressure chronic ulcer of other part of right foot with fat layer exposed: Secondary | ICD-10-CM

## 2020-01-04 DIAGNOSIS — M79674 Pain in right toe(s): Secondary | ICD-10-CM

## 2020-01-04 DIAGNOSIS — B351 Tinea unguium: Secondary | ICD-10-CM | POA: Diagnosis not present

## 2020-01-04 DIAGNOSIS — Q828 Other specified congenital malformations of skin: Secondary | ICD-10-CM

## 2020-01-05 ENCOUNTER — Encounter: Payer: Self-pay | Admitting: Podiatry
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
## 2020-01-09 ENCOUNTER — Telehealth: Payer: Self-pay | Admitting: Internal Medicine

## 2020-01-10 ENCOUNTER — Telehealth: Payer: Self-pay | Admitting: Cardiovascular Disease

## 2020-01-10 ENCOUNTER — Ambulatory Visit: Payer: Medicare Other | Admitting: Cardiovascular Disease

## 2020-01-10 ENCOUNTER — Other Ambulatory Visit: Payer: Self-pay

## 2020-01-10 ENCOUNTER — Encounter: Payer: Self-pay | Admitting: Cardiovascular Disease

## 2020-01-10 VITALS — BP 168/89 | HR 75 | Temp 96.9°F | Ht 74.0 in | Wt 204.6 lb

## 2020-01-10 DIAGNOSIS — I10 Essential (primary) hypertension: Secondary | ICD-10-CM | POA: Diagnosis not present

## 2020-01-10 DIAGNOSIS — I739 Peripheral vascular disease, unspecified: Secondary | ICD-10-CM

## 2020-01-10 DIAGNOSIS — E785 Hyperlipidemia, unspecified: Secondary | ICD-10-CM

## 2020-01-10 DIAGNOSIS — Z01818 Encounter for other preprocedural examination: Secondary | ICD-10-CM | POA: Diagnosis not present

## 2020-01-10 NOTE — Patient Instructions (Signed)
Medication Instructions:  No changes *If you need a refill on your cardiac medications before your next appointment, please call your pharmacy*  Lab Work: Your provider would like for you to have the following labs today: CBC and BMET  If you have labs (blood work) drawn today and your tests are completely normal, you will receive your results only by: Marland Kitchen MyChart Message (if you have MyChart) OR . A paper copy in the mail If you have any lab test that is abnormal or we need to change your treatment, we will call you to review the results.  Testing/Procedures: Your physician has requested that you have a lower extremity arterial duplex. During this test, ultrasound is used to evaluate arterial blood flow in the legs. Allow one hour for this exam. There are no restrictions or special instructions. This will take place at [address] [facility], [address].  Your physician has requested that you have an ankle brachial index (ABI). During this test an ultrasound and blood pressure cuff are used to evaluate the arteries that supply the arms and legs with blood. Allow thirty minutes for this exam. There are no restrictions or special instructions. This will take place at [address] [facility] [facility], [address].    Follow-Up: At [facility], you and your health needs are our priority.  As part of our continuing mission to provide you with exceptional heart care, we have created designated Provider Care Teams.  These Care Teams include your primary Cardiologist (physician) and Advanced Practice Providers (APPs -  Physician Assistants and Nurse Practitioners) who all work together to provide you with the care you need, when you need it.  Your next appointment:   1 month(s)  The format for your next appointment:   In Person  Provider:   Lorine Bears, MD  Other Instructions    [facility] GROUP [facility] CARDIOVASCULAR DIVISION [facility] [address] [place]  [place] [address] Dept: [phone] Loc: [phone]  Lonnie [name]  01/10/2020  You are scheduled for a Peripheral Angiogram on Wednesday, January 27 with Dr. Lorine Bears.  1. Please arrive at the [facility] (Main Entrance A) at [facility]: [address] [place], [place] [address] at 8:30 AM (This time is two hours before your procedure to ensure your preparation). Free valet parking service is available.   Special note: Every effort is made to have your procedure done on time. Please understand that emergencies sometimes delay scheduled procedures.  2. Diet: Do not eat solid foods after midnight.  The patient may have clear liquids until 5am upon the day of the procedure.  3. Labs: You will need to have blood drawn today at the [facility] office.  You will need to have the coronavirus test completed prior to your procedure. An appointment has been made at 12:10 on 01/14/20. This is a Drive Up Visit at the [facility] [address] Someone will direct you to the appropriate testing line. Please tell them that you are there for procedure testing. Stay in your car and someone will be with you shortly. Please make sure to have all other labs completed before this test because you will need to stay quarantined until your procedure.   4. Medication instructions in preparation for your procedure: Nothing to hold  On the morning of your procedure, take your Aspirin and any morning medicines NOT listed above.  You may use sips of water.  5. Plan for one night  stay--bring personal belongings. 6. Bring a current list of your medications and current insurance cards. 7. You MUST have a responsible person to drive you home. 8. Someone MUST be with you the first 24 hours after you arrive home or your discharge will be delayed. 9. Please wear clothes that are easy to get on and off and wear slip-on shoes.  Thank you for allowing [place] to care for you!   -- [facility]
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
## 2020-01-10 NOTE — Telephone Encounter (Signed)
Noted thank you

## 2020-01-10 NOTE — Telephone Encounter (Signed)
Refill request for Colace. Not on med list. Please advise.

## 2020-01-10 NOTE — Telephone Encounter (Signed)
Prescription sent
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
## 2020-01-11 ENCOUNTER — Ambulatory Visit: Payer: Medicare Other | Admitting: Podiatry

## 2020-01-11 DIAGNOSIS — L97512 Non-pressure chronic ulcer of other part of right foot with fat layer exposed: Secondary | ICD-10-CM | POA: Diagnosis not present

## 2020-01-11 DIAGNOSIS — I739 Peripheral vascular disease, unspecified: Secondary | ICD-10-CM | POA: Diagnosis not present

## 2020-01-11 DIAGNOSIS — I96 Gangrene, not elsewhere classified: Secondary | ICD-10-CM | POA: Diagnosis not present

## 2020-01-11 LAB — BASIC METABOLIC PANEL
BUN/Creatinine Ratio: 20 (ref 10–24)
BUN: 19 mg/dL (ref 8–27)
CO2: 18 mmol/L — ABNORMAL LOW (ref 20–29)
Calcium: 10.3 mg/dL — ABNORMAL HIGH (ref 8.6–10.2)
Chloride: 105 mmol/L (ref 96–106)
Creatinine, Ser: 0.97 mg/dL (ref 0.76–1.27)
GFR calc Af Amer: 94 mL/min/{1.73_m2} (ref >59)
GFR calc non Af Amer: 81 mL/min/{1.73_m2} (ref >59)
Glucose: 67 mg/dL (ref 65–99)
Potassium: 5.2 mmol/L (ref 3.5–5.2)
Sodium: 142 mmol/L (ref 134–144)

## 2020-01-11 LAB — CBC
Hematocrit: 46.1 % (ref 37.5–51.0)
Hemoglobin: 15.7 g/dL (ref 13.0–17.7)
MCH: 28.4 pg (ref 26.6–33.0)
MCHC: 34.1 g/dL (ref 31.5–35.7)
MCV: 83 fL (ref 79–97)
Platelets: 232 10*3/uL (ref 150–450)
RBC: 5.53 x10E6/uL (ref 4.14–5.80)
RDW: 12.8 % (ref 11.6–15.4)
WBC: 7.8 10*3/uL (ref 3.4–10.8)

## 2020-01-12 ENCOUNTER — Telehealth: Payer: Self-pay | Admitting: Cardiovascular Disease

## 2020-01-12 ENCOUNTER — Other Ambulatory Visit: Payer: Self-pay

## 2020-01-12 ENCOUNTER — Ambulatory Visit (HOSPITAL_COMMUNITY)
Admission: RE | Admit: 2020-01-12 | Discharge: 2020-01-12 | Disposition: A | Discharge status: Home / Self Care | Payer: Medicare Other | Source: Ambulatory Visit | Attending: Cardiology | Admitting: Cardiology

## 2020-01-12 DIAGNOSIS — I739 Peripheral vascular disease, unspecified: Secondary | ICD-10-CM | POA: Insufficient documentation

## 2020-01-12 DIAGNOSIS — Z01818 Encounter for other preprocedural examination: Secondary | ICD-10-CM

## 2020-01-12 NOTE — Telephone Encounter (Signed)
[name] was calling because she had some questions about the patient's procedure next Wednesday. All questions were answered. She has been advised to call back if she needs anything further.   She has been reminded of the patient's COVID test on Saturday.

## 2020-01-12 NOTE — Telephone Encounter (Signed)
New Message  [name] called and stated she would like to speak with a nurse regarding his appt with Dr. Kirke Corin. Has questions she would like to ask.  Please call to discuss

## 2020-01-13 ENCOUNTER — Encounter: Payer: Self-pay | Admitting: Podiatry
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
## 2020-01-14 ENCOUNTER — Other Ambulatory Visit (HOSPITAL_COMMUNITY)
Admission: RE | Admit: 2020-01-14 | Discharge: 2020-01-14 | Disposition: A | Discharge status: Home / Self Care | Payer: Medicare Other | Source: Ambulatory Visit | Attending: Cardiovascular Disease | Admitting: Cardiovascular Disease

## 2020-01-14 DIAGNOSIS — Z20822 Contact with and (suspected) exposure to covid-19: Secondary | ICD-10-CM | POA: Diagnosis not present

## 2020-01-14 DIAGNOSIS — Z01812 Encounter for preprocedural laboratory examination: Secondary | ICD-10-CM | POA: Insufficient documentation

## 2020-01-14 LAB — SARS CORONAVIRUS 2 (TAT 6-24 HRS): SARS Coronavirus 2: NEGATIVE

## 2020-01-15 ENCOUNTER — Other Ambulatory Visit: Payer: Self-pay | Admitting: Internal Medicine

## 2020-01-17 ENCOUNTER — Telehealth: Payer: Self-pay | Admitting: *Deleted

## 2020-01-17 NOTE — Telephone Encounter (Signed)
Pt contacted pre-catheterization scheduled at [facility] for: Wednesday January 18, 2020 10:30 AM Verified arrival time and place: [facility] Main Entrance [facility]) at: 8:30 AM   No solid food after midnight prior to cath, clear liquids until 5 AM day of procedure. Contrast allergy: no  AM meds can be  taken pre-cath with sip of water including: ASA 81 mg   Confirmed patient has responsible adult to drive home post procedure and observe 24 hours after arriving home:   Currently, due to Covid-19 pandemic, only one person will be allowed with patient. Must be the same person for patient's entire stay and will be required to wear a mask. They will be asked to wait in the waiting room for the duration of the patient's stay.  Patients are required to wear a mask when they enter the hospital.  [facility] to review procedure instructions with patient.

## 2020-01-18 ENCOUNTER — Encounter (HOSPITAL_COMMUNITY)
Admission: RE | Disposition: A | Discharge status: Home / Self Care | Payer: Self-pay | Source: Home / Self Care | Attending: Cardiovascular Disease

## 2020-01-18 ENCOUNTER — Other Ambulatory Visit: Payer: Self-pay

## 2020-01-18 ENCOUNTER — Ambulatory Visit (HOSPITAL_COMMUNITY)
Admission: RE | Admit: 2020-01-18 | Discharge: 2020-01-18 | Disposition: A | Discharge status: Home / Self Care | Payer: Medicare Other | Source: Home / Self Care | Attending: Cardiovascular Disease | Admitting: Cardiovascular Disease

## 2020-01-18 DIAGNOSIS — Z7982 Long term (current) use of aspirin: Secondary | ICD-10-CM | POA: Insufficient documentation

## 2020-01-18 DIAGNOSIS — E785 Hyperlipidemia, unspecified: Secondary | ICD-10-CM | POA: Insufficient documentation

## 2020-01-18 DIAGNOSIS — L97919 Non-pressure chronic ulcer of unspecified part of right lower leg with unspecified severity: Secondary | ICD-10-CM | POA: Insufficient documentation

## 2020-01-18 DIAGNOSIS — Z951 Presence of aortocoronary bypass graft: Secondary | ICD-10-CM | POA: Insufficient documentation

## 2020-01-18 DIAGNOSIS — Z79899 Other long term (current) drug therapy: Secondary | ICD-10-CM | POA: Insufficient documentation

## 2020-01-18 DIAGNOSIS — I252 Old myocardial infarction: Secondary | ICD-10-CM | POA: Insufficient documentation

## 2020-01-18 DIAGNOSIS — I255 Ischemic cardiomyopathy: Secondary | ICD-10-CM | POA: Diagnosis not present

## 2020-01-18 DIAGNOSIS — Z8249 Family history of ischemic heart disease and other diseases of the circulatory system: Secondary | ICD-10-CM | POA: Insufficient documentation

## 2020-01-18 DIAGNOSIS — I739 Peripheral vascular disease, unspecified: Secondary | ICD-10-CM

## 2020-01-18 DIAGNOSIS — I2510 Atherosclerotic heart disease of native coronary artery without angina pectoris: Secondary | ICD-10-CM | POA: Insufficient documentation

## 2020-01-18 DIAGNOSIS — I70238 Atherosclerosis of native arteries of right leg with ulceration of other part of lower right leg: Secondary | ICD-10-CM | POA: Diagnosis not present

## 2020-01-18 DIAGNOSIS — Z87820 Personal history of traumatic brain injury: Secondary | ICD-10-CM | POA: Insufficient documentation

## 2020-01-18 DIAGNOSIS — R7303 Prediabetes: Secondary | ICD-10-CM | POA: Insufficient documentation

## 2020-01-18 DIAGNOSIS — I10 Essential (primary) hypertension: Secondary | ICD-10-CM | POA: Insufficient documentation

## 2020-01-18 DIAGNOSIS — F17210 Nicotine dependence, cigarettes, uncomplicated: Secondary | ICD-10-CM | POA: Insufficient documentation

## 2020-01-18 DIAGNOSIS — I998 Other disorder of circulatory system: Secondary | ICD-10-CM | POA: Diagnosis not present

## 2020-01-18 HISTORY — PX: ABDOMINAL AORTOGRAM W/LOWER EXTREMITY: CATH118223

## 2020-01-18 SURGERY — ABDOMINAL AORTOGRAM W/LOWER EXTREMITY
Anesthesia: LOCAL

## 2020-01-18 MED ORDER — SODIUM CHLORIDE 0.9% FLUSH: 3.0000 mL | INTRAVENOUS | Status: DC | PRN

## 2020-01-18 MED ORDER — ACETAMINOPHEN 325 MG PO TABS: 650.0000 mg | ORAL_TABLET | ORAL | Status: DC | PRN

## 2020-01-18 MED ORDER — SODIUM CHLORIDE 0.9 % IV SOLN: 250.0000 mL | INTRAVENOUS | Status: DC | PRN

## 2020-01-18 MED ORDER — SODIUM CHLORIDE 0.9% FLUSH: 3.0000 mL | Freq: Two times a day (BID) | INTRAVENOUS | Status: DC

## 2020-01-18 MED ORDER — FENTANYL CITRATE (PF) 100 MCG/2ML IJ SOLN
INTRAMUSCULAR | Status: DC | PRN
  Administered 2020-01-18: 25 ug via INTRAVENOUS
  Administered 2020-01-18: 50 ug via INTRAVENOUS

## 2020-01-18 MED ORDER — MIDAZOLAM HCL 2 MG/2ML IJ SOLN
INTRAMUSCULAR | Status: DC | PRN
  Administered 2020-01-18: 1 mg via INTRAVENOUS

## 2020-01-18 MED ORDER — LIDOCAINE HCL (PF) 1 % IJ SOLN
INTRAMUSCULAR | Status: AC
  Filled 2020-01-18: qty 30

## 2020-01-18 MED ORDER — SODIUM CHLORIDE 0.9 % IV SOLN: INTRAVENOUS | Status: DC

## 2020-01-18 MED ORDER — ONDANSETRON HCL 4 MG/2ML IJ SOLN: 4.0000 mg | Freq: Four times a day (QID) | INTRAMUSCULAR | Status: DC | PRN

## 2020-01-18 MED ORDER — HEPARIN (PORCINE) IN NACL 1000-0.9 UT/500ML-% IV SOLN
INTRAVENOUS | Status: AC
  Filled 2020-01-18: qty 1000

## 2020-01-18 MED ORDER — FENTANYL CITRATE (PF) 100 MCG/2ML IJ SOLN
INTRAMUSCULAR | Status: AC
  Filled 2020-01-18: qty 2

## 2020-01-18 MED ORDER — LIDOCAINE HCL (PF) 1 % IJ SOLN
INTRAMUSCULAR | Status: DC | PRN
  Administered 2020-01-18: 15 mL via INTRADERMAL

## 2020-01-18 MED ORDER — IODIXANOL 320 MG/ML IV SOLN
INTRAVENOUS | Status: DC | PRN
  Administered 2020-01-18: 60 mL via INTRA_ARTERIAL

## 2020-01-18 MED ORDER — ASPIRIN 81 MG PO CHEW: 81.0000 mg | CHEWABLE_TABLET | ORAL | Status: DC

## 2020-01-18 MED ORDER — HEPARIN (PORCINE) IN NACL 1000-0.9 UT/500ML-% IV SOLN
INTRAVENOUS | Status: DC | PRN
  Administered 2020-01-18 (×2): 500 mL

## 2020-01-18 MED ORDER — MIDAZOLAM HCL 2 MG/2ML IJ SOLN
INTRAMUSCULAR | Status: AC
  Filled 2020-01-18: qty 2

## 2020-01-18 SURGICAL SUPPLY — 14 items
CATH ANGIO 5F PIGTAIL 65CM (CATHETERS) ×1 IMPLANT
CATH CROSS OVER TEMPO 5F (CATHETERS) ×1 IMPLANT
CATH SOFT-VU 4F 65 STRAIGHT (CATHETERS) IMPLANT
CATH SOFT-VU STRAIGHT 4F 65CM (CATHETERS) ×2
CLOSURE MYNX CONTROL 5F (Vascular Products) ×1 IMPLANT
KIT MICROPUNCTURE NIT STIFF (SHEATH) ×1 IMPLANT
KIT PV (KITS) ×2 IMPLANT
SHEATH PINNACLE 5F 10CM (SHEATH) ×1 IMPLANT
SHEATH PROBE COVER 6X72 (BAG) ×1 IMPLANT
STOPCOCK MORSE 400PSI 3WAY (MISCELLANEOUS) ×1 IMPLANT
SYR MEDRAD MARK 7 150ML (SYRINGE) ×2 IMPLANT
TRANSDUCER W/STOPCOCK (MISCELLANEOUS) ×2 IMPLANT
TRAY PV CATH (CUSTOM PROCEDURE TRAY) ×2 IMPLANT
WIRE BENTSON .035X145CM (WIRE) ×1 IMPLANT

## 2020-01-18 NOTE — Interval H&P Note (Signed)
History and Physical Interval Note:  01/18/2020 10:33 AM  Lonnie [name]  has presented today for surgery, with the diagnosis of pad.  The various methods of treatment have been discussed with the patient and family. After consideration of risks, benefits and other options for treatment, the patient has consented to  Procedure(s): ABDOMINAL AORTOGRAM W/LOWER EXTREMITY - Right (N/A) as a surgical intervention.  The patient's history has been reviewed, patient examined, no change in status, stable for surgery.  I have reviewed the patient's chart and labs.  Questions were answered to the patient's satisfaction.     Lorine Bears

## 2020-01-18 NOTE — Discharge Instructions (Signed)
    Discharge Instructions  Lower Extremity Angiogram; Angioplasty/Stenting  Please refer to the following instructions for your post-procedure care. Your surgeon or physician assistant will discuss any changes with you.  Activity  Avoid lifting more than 8 pounds (1 gallons of milk) for 72 hours (3 days) after your procedure. You may walk as much as you can tolerate. It's OK to drive after 72 hours.  Bathing/Showering  You may shower the day after your procedure. If you have a bandage, you may remove it at 24- 48 hours. Clean your incision site with mild soap and water. Pat the area dry with a clean towel.  Diet  DRINK PLENTY OF FLUIDS FOR THE NEXT 2-3 DAYS.  Resume your pre-procedure diet. There are no special food restrictions following this procedure. All patients with peripheral vascular disease should follow a low fat/low cholesterol diet. In order to heal from your surgery, it is CRITICAL to get adequate nutrition. Your body requires vitamins, minerals, and protein. Vegetables are the best source of vitamins and minerals. Vegetables also provide the perfect balance of protein. Processed food has little nutritional value, so try to avoid this.  Medications  Resume taking all of your medications unless your doctor tells you not to. If your incision is causing pain, you may take over-the-counter pain relievers such as acetaminophen (Tylenol)  Follow Up  Follow up will be arranged at the time of your procedure. You may have an office visit scheduled or may be scheduled for surgery. Ask your surgeon if you have any questions.  Please call us immediately for any of the following conditions: .Severe or worsening pain your legs or feet at rest or with walking. .Increased pain, redness, drainage at your groin puncture site. .Fever of 101 degrees or higher. .If you have any mild or slow bleeding from your puncture site: lie down, apply firm constant pressure over the area with a piece  of gauze or a clean wash cloth for 30 minutes- no peeking!, call 911 right away if you are still bleeding after 30 minutes, or if the bleeding is heavy and unmanageable.  Reduce your risk factors of vascular disease:  Stop smoking. If you would like help call [facility] at 1-800-QUIT-NOW (226 537 3825) or [place] at 770-814-8954. Manage your cholesterol Maintain a desired weight Control your diabetes Keep your blood pressure down  If you have any questions, please call Dr Jari Sportsman office.
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
## 2020-01-19 ENCOUNTER — Telehealth: Payer: Self-pay | Admitting: Internal Medicine

## 2020-01-19 ENCOUNTER — Other Ambulatory Visit (HOSPITAL_COMMUNITY)
Admission: RE | Admit: 2020-01-19 | Discharge: 2020-01-19 | Disposition: A | Discharge status: Home / Self Care | Payer: Medicare Other | Source: Ambulatory Visit | Attending: Surgery | Admitting: Surgery

## 2020-01-19 ENCOUNTER — Other Ambulatory Visit: Payer: Self-pay

## 2020-01-19 ENCOUNTER — Other Ambulatory Visit: Payer: Self-pay | Admitting: *Deleted

## 2020-01-19 LAB — SARS CORONAVIRUS 2 (TAT 6-24 HRS): SARS Coronavirus 2: NEGATIVE

## 2020-01-19 NOTE — Telephone Encounter (Signed)
Sent!

## 2020-01-19 NOTE — Telephone Encounter (Signed)
Clonazepam refill.   Last OV: 12/30/2019 Last Fill: 11/21/2019 #60 and 1RF Pt sig: 1 tab bid  UDS: None

## 2020-01-19 NOTE — Progress Notes (Signed)
Call to [facility] in PST that patient is add on surgery for the morning. Per anesthesia patient must have nasal swab for Covid today. Spoke with patient's daughter [name]. Instructed to go to [facility] for pre-op nasal swab testing for Covid today appt for 11:45(scheduled with Shanda Bumps at [facility] NPO past MN tonight and instructed to be at [facility] admitting at 8:00 am or as directed by the hospital for surgery with Dr. Myra Gianotti 01/20/2020. Expect a call and follow the detailed surgery instructions received by the pre-admission department.Daughter verbalized understanding.
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
## 2020-01-20 ENCOUNTER — Inpatient Hospital Stay (HOSPITAL_COMMUNITY)
Admission: RE | Admit: 2020-01-20 | Discharge: 2020-01-23 | DRG: 253 | Disposition: A | Discharge status: Home Health | Payer: Medicare Other | Attending: Surgery | Admitting: Surgery

## 2020-01-20 ENCOUNTER — Encounter (HOSPITAL_COMMUNITY)
Admission: RE | Disposition: A | Discharge status: Home Health | Payer: Self-pay | Source: Home / Self Care | Attending: Surgery

## 2020-01-20 ENCOUNTER — Inpatient Hospital Stay (HOSPITAL_COMMUNITY): Payer: Medicare Other | Admitting: Vascular Surgery

## 2020-01-20 ENCOUNTER — Encounter (HOSPITAL_COMMUNITY): Payer: Self-pay | Admitting: Surgery

## 2020-01-20 ENCOUNTER — Other Ambulatory Visit: Payer: Self-pay

## 2020-01-20 DIAGNOSIS — R451 Restlessness and agitation: Secondary | ICD-10-CM | POA: Diagnosis not present

## 2020-01-20 DIAGNOSIS — F419 Anxiety disorder, unspecified: Secondary | ICD-10-CM | POA: Diagnosis present

## 2020-01-20 DIAGNOSIS — I739 Peripheral vascular disease, unspecified: Secondary | ICD-10-CM | POA: Diagnosis present

## 2020-01-20 DIAGNOSIS — Z91010 Allergy to peanuts: Secondary | ICD-10-CM

## 2020-01-20 DIAGNOSIS — E7800 Pure hypercholesterolemia, unspecified: Secondary | ICD-10-CM | POA: Diagnosis present

## 2020-01-20 DIAGNOSIS — I10 Essential (primary) hypertension: Secondary | ICD-10-CM | POA: Diagnosis present

## 2020-01-20 DIAGNOSIS — M1712 Unilateral primary osteoarthritis, left knee: Secondary | ICD-10-CM | POA: Diagnosis present

## 2020-01-20 DIAGNOSIS — Z888 Allergy status to other drugs, medicaments and biological substances status: Secondary | ICD-10-CM

## 2020-01-20 DIAGNOSIS — M19012 Primary osteoarthritis, left shoulder: Secondary | ICD-10-CM | POA: Diagnosis present

## 2020-01-20 DIAGNOSIS — Z20822 Contact with and (suspected) exposure to covid-19: Secondary | ICD-10-CM | POA: Diagnosis present

## 2020-01-20 DIAGNOSIS — I252 Old myocardial infarction: Secondary | ICD-10-CM | POA: Diagnosis not present

## 2020-01-20 DIAGNOSIS — R296 Repeated falls: Secondary | ICD-10-CM | POA: Diagnosis present

## 2020-01-20 DIAGNOSIS — Z91011 Allergy to milk products: Secondary | ICD-10-CM

## 2020-01-20 DIAGNOSIS — E785 Hyperlipidemia, unspecified: Secondary | ICD-10-CM | POA: Diagnosis present

## 2020-01-20 DIAGNOSIS — R7303 Prediabetes: Secondary | ICD-10-CM | POA: Diagnosis present

## 2020-01-20 DIAGNOSIS — F17210 Nicotine dependence, cigarettes, uncomplicated: Secondary | ICD-10-CM | POA: Diagnosis present

## 2020-01-20 DIAGNOSIS — Z951 Presence of aortocoronary bypass graft: Secondary | ICD-10-CM

## 2020-01-20 DIAGNOSIS — Z8249 Family history of ischemic heart disease and other diseases of the circulatory system: Secondary | ICD-10-CM

## 2020-01-20 DIAGNOSIS — L97519 Non-pressure chronic ulcer of other part of right foot with unspecified severity: Secondary | ICD-10-CM | POA: Diagnosis present

## 2020-01-20 DIAGNOSIS — I255 Ischemic cardiomyopathy: Secondary | ICD-10-CM | POA: Diagnosis present

## 2020-01-20 DIAGNOSIS — Z8673 Personal history of transient ischemic attack (TIA), and cerebral infarction without residual deficits: Secondary | ICD-10-CM | POA: Diagnosis not present

## 2020-01-20 DIAGNOSIS — I70263 Atherosclerosis of native arteries of extremities with gangrene, bilateral legs: Principal | ICD-10-CM | POA: Diagnosis present

## 2020-01-20 DIAGNOSIS — Z79899 Other long term (current) drug therapy: Secondary | ICD-10-CM | POA: Diagnosis not present

## 2020-01-20 DIAGNOSIS — F329 Major depressive disorder, single episode, unspecified: Secondary | ICD-10-CM | POA: Diagnosis present

## 2020-01-20 DIAGNOSIS — F05 Delirium due to known physiological condition: Secondary | ICD-10-CM | POA: Diagnosis not present

## 2020-01-20 DIAGNOSIS — I70238 Atherosclerosis of native arteries of right leg with ulceration of other part of lower right leg: Secondary | ICD-10-CM | POA: Diagnosis not present

## 2020-01-20 DIAGNOSIS — Z7982 Long term (current) use of aspirin: Secondary | ICD-10-CM

## 2020-01-20 DIAGNOSIS — Z91012 Allergy to eggs: Secondary | ICD-10-CM

## 2020-01-20 DIAGNOSIS — I2510 Atherosclerotic heart disease of native coronary artery without angina pectoris: Secondary | ICD-10-CM | POA: Diagnosis present

## 2020-01-20 DIAGNOSIS — F0390 Unspecified dementia without behavioral disturbance: Secondary | ICD-10-CM | POA: Diagnosis present

## 2020-01-20 DIAGNOSIS — Z833 Family history of diabetes mellitus: Secondary | ICD-10-CM

## 2020-01-20 HISTORY — DX: Repeated falls: R29.6

## 2020-01-20 HISTORY — DX: Unspecified fall, initial encounter: W19.XXXA

## 2020-01-20 HISTORY — DX: Anxiety disorder, unspecified: F41.9

## 2020-01-20 HISTORY — DX: Cerebral infarction, unspecified: I63.9

## 2020-01-20 HISTORY — PX: FEMORAL-POPLITEAL BYPASS GRAFT: SHX937

## 2020-01-20 HISTORY — DX: Depression, unspecified: F32.A

## 2020-01-20 HISTORY — DX: Unspecified osteoarthritis, unspecified site: M19.90

## 2020-01-20 HISTORY — DX: Unspecified dementia, unspecified severity, without behavioral disturbance, psychotic disturbance, mood disturbance, and anxiety: F03.90

## 2020-01-20 LAB — CBC
HCT: 45.4 % (ref 39.0–52.0)
Hemoglobin: 14.2 g/dL (ref 13.0–17.0)
MCH: 27.4 pg (ref 26.0–34.0)
MCHC: 31.3 g/dL (ref 30.0–36.0)
MCV: 87.6 fL (ref 80.0–100.0)
Platelets: 247 10*3/uL (ref 150–400)
RBC: 5.18 MIL/uL (ref 4.22–5.81)
RDW: 13.1 % (ref 11.5–15.5)
WBC: 7.4 10*3/uL (ref 4.0–10.5)
nRBC: 0 % (ref 0.0–0.2)

## 2020-01-20 LAB — COMPREHENSIVE METABOLIC PANEL
ALT: 33 U/L (ref 0–44)
AST: 32 U/L (ref 15–41)
Albumin: 4.3 g/dL (ref 3.5–5.0)
Alkaline Phosphatase: 90 U/L (ref 38–126)
Anion gap: 11 (ref 5–15)
BUN: 20 mg/dL (ref 8–23)
CO2: 23 mmol/L (ref 22–32)
Calcium: 9.7 mg/dL (ref 8.9–10.3)
Chloride: 106 mmol/L (ref 98–111)
Creatinine, Ser: 1.11 mg/dL (ref 0.61–1.24)
GFR calc Af Amer: >60 mL/min (ref >60)
GFR calc non Af Amer: >60 mL/min (ref >60)
Glucose, Bld: 102 mg/dL — ABNORMAL HIGH (ref 70–99)
Potassium: 4 mmol/L (ref 3.5–5.1)
Sodium: 140 mmol/L (ref 135–145)
Total Bilirubin: 0.5 mg/dL (ref 0.3–1.2)
Total Protein: 7.2 g/dL (ref 6.5–8.1)

## 2020-01-20 LAB — URINALYSIS, ROUTINE W REFLEX MICROSCOPIC
Bilirubin Urine: NEGATIVE
Glucose, UA: NEGATIVE mg/dL
Hgb urine dipstick: NEGATIVE
Ketones, ur: NEGATIVE mg/dL
Leukocytes,Ua: NEGATIVE
Nitrite: NEGATIVE
Protein, ur: NEGATIVE mg/dL
Specific Gravity, Urine: 1.008 (ref 1.005–1.030)
pH: 5 (ref 5.0–8.0)

## 2020-01-20 LAB — GLUCOSE, CAPILLARY
Glucose-Capillary: 128 mg/dL — ABNORMAL HIGH (ref 70–99)
Glucose-Capillary: 112 mg/dL — ABNORMAL HIGH (ref 70–99)

## 2020-01-20 LAB — SURGICAL PCR SCREEN
MRSA, PCR: NEGATIVE
Staphylococcus aureus: NEGATIVE

## 2020-01-20 LAB — PROTIME-INR
INR: 0.9 (ref 0.8–1.2)
Prothrombin Time: 12.1 seconds (ref 11.4–15.2)

## 2020-01-20 LAB — TYPE AND SCREEN
ABO/RH(D): O POS
Antibody Screen: NEGATIVE

## 2020-01-20 LAB — APTT: aPTT: 31 seconds (ref 24–36)

## 2020-01-20 SURGERY — BYPASS GRAFT FEMORAL-POPLITEAL ARTERY
Anesthesia: General | Laterality: Right

## 2020-01-20 MED ORDER — QUETIAPINE FUMARATE 25 MG PO TABS
150.0000 mg | ORAL_TABLET | Freq: Two times a day (BID) | ORAL | Status: DC
  Administered 2020-01-20 – 2020-01-23 (×6): 150 mg via ORAL
  Filled 2020-01-20 (×6): qty 1

## 2020-01-20 MED ORDER — HYDROMORPHONE HCL 1 MG/ML IJ SOLN
0.5000 mg | INTRAMUSCULAR | Status: DC | PRN
  Administered 2020-01-22: 1 mg via INTRAVENOUS
  Filled 2020-01-20: qty 1

## 2020-01-20 MED ORDER — HEMOSTATIC AGENTS (NO CHARGE) OPTIME
TOPICAL | Status: DC | PRN
  Administered 2020-01-20 (×2): 1 via TOPICAL

## 2020-01-20 MED ORDER — PHENOL 1.4 % MT LIQD: 1.0000 | OROMUCOSAL | Status: DC | PRN

## 2020-01-20 MED ORDER — FLEET ENEMA 7-19 GM/118ML RE ENEM: 1.0000 | ENEMA | Freq: Once | RECTAL | Status: DC | PRN

## 2020-01-20 MED ORDER — 0.9 % SODIUM CHLORIDE (POUR BTL) OPTIME
TOPICAL | Status: DC | PRN
  Administered 2020-01-20: 2000 mL

## 2020-01-20 MED ORDER — METOPROLOL TARTRATE 5 MG/5ML IV SOLN: 2.0000 mg | INTRAVENOUS | Status: DC | PRN

## 2020-01-20 MED ORDER — DOCUSATE SODIUM 100 MG PO CAPS
100.0000 mg | ORAL_CAPSULE | Freq: Every day | ORAL | Status: DC
  Administered 2020-01-21 – 2020-01-23 (×2): 100 mg via ORAL
  Filled 2020-01-20 (×2): qty 1

## 2020-01-20 MED ORDER — PROTAMINE SULFATE 10 MG/ML IV SOLN
INTRAVENOUS | Status: DC | PRN
  Administered 2020-01-20: 10 mg via INTRAVENOUS
  Administered 2020-01-20: 40 mg via INTRAVENOUS

## 2020-01-20 MED ORDER — FENTANYL CITRATE (PF) 250 MCG/5ML IJ SOLN
INTRAMUSCULAR | Status: AC
  Filled 2020-01-20: qty 5

## 2020-01-20 MED ORDER — CEFAZOLIN SODIUM-DEXTROSE 2-4 GM/100ML-% IV SOLN
2.0000 g | INTRAVENOUS | Status: AC
  Administered 2020-01-20: 2 g via INTRAVENOUS
  Filled 2020-01-20: qty 100

## 2020-01-20 MED ORDER — CHLORHEXIDINE GLUCONATE 4 % EX LIQD: 60.0000 mL | Freq: Once | CUTANEOUS | Status: DC

## 2020-01-20 MED ORDER — FENTANYL CITRATE (PF) 250 MCG/5ML IJ SOLN
INTRAMUSCULAR | Status: DC | PRN
  Administered 2020-01-20 (×2): 100 ug via INTRAVENOUS
  Administered 2020-01-20: 50 ug via INTRAVENOUS

## 2020-01-20 MED ORDER — LABETALOL HCL 5 MG/ML IV SOLN: 10.0000 mg | INTRAVENOUS | Status: DC | PRN

## 2020-01-20 MED ORDER — FLUTICASONE PROPIONATE 50 MCG/ACT NA SUSP
2.0000 | Freq: Every day | NASAL | Status: DC
  Administered 2020-01-21 – 2020-01-23 (×2): 2 via NASAL
  Filled 2020-01-20: qty 16

## 2020-01-20 MED ORDER — LACTATED RINGERS IV SOLN: INTRAVENOUS | Status: DC

## 2020-01-20 MED ORDER — ASPIRIN EC 81 MG PO TBEC
81.0000 mg | DELAYED_RELEASE_TABLET | Freq: Every day | ORAL | Status: DC
  Administered 2020-01-21 – 2020-01-23 (×3): 81 mg via ORAL
  Filled 2020-01-20 (×3): qty 1

## 2020-01-20 MED ORDER — POTASSIUM CHLORIDE CRYS ER 20 MEQ PO TBCR: 20.0000 meq | EXTENDED_RELEASE_TABLET | Freq: Every day | ORAL | Status: DC | PRN

## 2020-01-20 MED ORDER — ZOLPIDEM TARTRATE 5 MG PO TABS
5.0000 mg | ORAL_TABLET | Freq: Every evening | ORAL | Status: DC | PRN
  Administered 2020-01-21 – 2020-01-22 (×2): 5 mg via ORAL
  Filled 2020-01-20 (×2): qty 1

## 2020-01-20 MED ORDER — LORATADINE 10 MG PO TABS
10.0000 mg | ORAL_TABLET | Freq: Every day | ORAL | Status: DC
  Administered 2020-01-23: 10 mg via ORAL
  Filled 2020-01-20 (×2): qty 1

## 2020-01-20 MED ORDER — SODIUM CHLORIDE 0.9 % IV SOLN: INTRAVENOUS | Status: DC

## 2020-01-20 MED ORDER — ALBUMIN HUMAN 5 % IV SOLN: INTRAVENOUS | Status: DC | PRN

## 2020-01-20 MED ORDER — GABAPENTIN 100 MG PO CAPS
100.0000 mg | ORAL_CAPSULE | Freq: Two times a day (BID) | ORAL | Status: DC
  Administered 2020-01-20 – 2020-01-23 (×6): 100 mg via ORAL
  Filled 2020-01-20 (×6): qty 1

## 2020-01-20 MED ORDER — ACETAMINOPHEN 325 MG RE SUPP: 325.0000 mg | RECTAL | Status: DC | PRN

## 2020-01-20 MED ORDER — SODIUM CHLORIDE 0.9 % IV SOLN
INTRAVENOUS | Status: AC
  Filled 2020-01-20: qty 1.2

## 2020-01-20 MED ORDER — DEXAMETHASONE SODIUM PHOSPHATE 10 MG/ML IJ SOLN
INTRAMUSCULAR | Status: AC
  Filled 2020-01-20: qty 1

## 2020-01-20 MED ORDER — PANTOPRAZOLE SODIUM 40 MG PO TBEC
40.0000 mg | DELAYED_RELEASE_TABLET | Freq: Every day | ORAL | Status: DC
  Administered 2020-01-20 – 2020-01-22 (×3): 40 mg via ORAL
  Filled 2020-01-20 (×4): qty 1

## 2020-01-20 MED ORDER — ROCURONIUM BROMIDE 10 MG/ML (PF) SYRINGE
PREFILLED_SYRINGE | INTRAVENOUS | Status: DC | PRN
  Administered 2020-01-20: 50 mg via INTRAVENOUS
  Administered 2020-01-20: 20 mg via INTRAVENOUS
  Administered 2020-01-20: 30 mg via INTRAVENOUS

## 2020-01-20 MED ORDER — BISACODYL 5 MG PO TBEC: 5.0000 mg | DELAYED_RELEASE_TABLET | Freq: Every day | ORAL | Status: DC | PRN

## 2020-01-20 MED ORDER — PROPOFOL 10 MG/ML IV BOLUS
INTRAVENOUS | Status: DC | PRN
  Administered 2020-01-20 (×2): 50 mg via INTRAVENOUS
  Administered 2020-01-20: 120 mg via INTRAVENOUS

## 2020-01-20 MED ORDER — ATORVASTATIN CALCIUM 80 MG PO TABS
80.0000 mg | ORAL_TABLET | Freq: Every day | ORAL | Status: DC
  Administered 2020-01-20 – 2020-01-21 (×2): 80 mg via ORAL
  Filled 2020-01-20: qty 1

## 2020-01-20 MED ORDER — METOPROLOL TARTRATE 25 MG PO TABS
25.0000 mg | ORAL_TABLET | Freq: Two times a day (BID) | ORAL | Status: DC
  Administered 2020-01-20 – 2020-01-23 (×6): 25 mg via ORAL
  Filled 2020-01-20 (×6): qty 1

## 2020-01-20 MED ORDER — CALCIUM CARBONATE-VITAMIN D 500-200 MG-UNIT PO TABS
1.0000 | ORAL_TABLET | Freq: Every day | ORAL | Status: DC
  Administered 2020-01-21 – 2020-01-23 (×3): 1 via ORAL
  Filled 2020-01-20 (×3): qty 1

## 2020-01-20 MED ORDER — ACETAMINOPHEN 325 MG PO TABS
325.0000 mg | ORAL_TABLET | ORAL | Status: DC | PRN
  Administered 2020-01-22: 650 mg via ORAL
  Filled 2020-01-20: qty 2

## 2020-01-20 MED ORDER — CEFAZOLIN SODIUM-DEXTROSE 2-4 GM/100ML-% IV SOLN
2.0000 g | Freq: Three times a day (TID) | INTRAVENOUS | Status: AC
  Administered 2020-01-20 – 2020-01-21 (×2): 2 g via INTRAVENOUS
  Filled 2020-01-20 (×3): qty 100

## 2020-01-20 MED ORDER — LIDOCAINE 2% (20 MG/ML) 5 ML SYRINGE
INTRAMUSCULAR | Status: DC | PRN
  Administered 2020-01-20: 80 mg via INTRAVENOUS

## 2020-01-20 MED ORDER — GUAIFENESIN-DM 100-10 MG/5ML PO SYRP: 15.0000 mL | ORAL_SOLUTION | ORAL | Status: DC | PRN

## 2020-01-20 MED ORDER — MIDAZOLAM HCL 2 MG/2ML IJ SOLN
INTRAMUSCULAR | Status: DC | PRN
  Administered 2020-01-20: 2 mg via INTRAVENOUS

## 2020-01-20 MED ORDER — PHENYLEPHRINE HCL (PRESSORS) 10 MG/ML IV SOLN
INTRAVENOUS | Status: AC
  Filled 2020-01-20: qty 1

## 2020-01-20 MED ORDER — GLYCOPYRROLATE PF 0.2 MG/ML IJ SOSY
PREFILLED_SYRINGE | INTRAMUSCULAR | Status: DC | PRN
  Administered 2020-01-20: .2 mg via INTRAVENOUS

## 2020-01-20 MED ORDER — MAGNESIUM SULFATE 2 GM/50ML IV SOLN: 2.0000 g | Freq: Every day | INTRAVENOUS | Status: DC | PRN

## 2020-01-20 MED ORDER — PHENYLEPHRINE HCL-NACL 10-0.9 MG/250ML-% IV SOLN
INTRAVENOUS | Status: DC | PRN
  Administered 2020-01-20: 40 ug/min via INTRAVENOUS

## 2020-01-20 MED ORDER — SODIUM CHLORIDE 0.9 % IV SOLN
INTRAVENOUS | Status: DC | PRN
  Administered 2020-01-20: 500 mL

## 2020-01-20 MED ORDER — MIDAZOLAM HCL 2 MG/2ML IJ SOLN
INTRAMUSCULAR | Status: AC
  Filled 2020-01-20: qty 2

## 2020-01-20 MED ORDER — HEPARIN SODIUM (PORCINE) 1000 UNIT/ML IJ SOLN
INTRAMUSCULAR | Status: DC | PRN
  Administered 2020-01-20: 9000 [IU] via INTRAVENOUS

## 2020-01-20 MED ORDER — HYDRALAZINE HCL 20 MG/ML IJ SOLN: 5.0000 mg | INTRAMUSCULAR | Status: DC | PRN

## 2020-01-20 MED ORDER — POVIDONE-IODINE 5 % EX SOLN
Freq: Every day | CUTANEOUS | Status: DC
  Filled 2020-01-20: qty 88.7

## 2020-01-20 MED ORDER — ONDANSETRON HCL 4 MG/2ML IJ SOLN
INTRAMUSCULAR | Status: DC | PRN
  Administered 2020-01-20: 4 mg via INTRAVENOUS

## 2020-01-20 MED ORDER — TAMSULOSIN HCL 0.4 MG PO CAPS
0.8000 mg | ORAL_CAPSULE | Freq: Every day | ORAL | Status: DC
  Administered 2020-01-20 – 2020-01-22 (×3): 0.8 mg via ORAL
  Filled 2020-01-20 (×3): qty 2

## 2020-01-20 MED ORDER — SODIUM CHLORIDE 0.9 % IV SOLN: 500.0000 mL | Freq: Once | INTRAVENOUS | Status: DC | PRN

## 2020-01-20 MED ORDER — EPHEDRINE SULFATE-NACL 50-0.9 MG/10ML-% IV SOSY
PREFILLED_SYRINGE | INTRAVENOUS | Status: DC | PRN
  Administered 2020-01-20: 10 mg via INTRAVENOUS
  Administered 2020-01-20: 20 mg via INTRAVENOUS
  Administered 2020-01-20 (×2): 10 mg via INTRAVENOUS

## 2020-01-20 MED ORDER — LIDOCAINE 2% (20 MG/ML) 5 ML SYRINGE
INTRAMUSCULAR | Status: AC
  Filled 2020-01-20: qty 5

## 2020-01-20 MED ORDER — MUPIROCIN 2 % EX OINT: 1.0000 "application " | TOPICAL_OINTMENT | CUTANEOUS | Status: AC

## 2020-01-20 MED ORDER — DIPHENHYDRAMINE HCL 50 MG/ML IJ SOLN
INTRAMUSCULAR | Status: DC | PRN
  Administered 2020-01-20: 12.5 mg via INTRAVENOUS

## 2020-01-20 MED ORDER — SENNOSIDES-DOCUSATE SODIUM 8.6-50 MG PO TABS: 1.0000 | ORAL_TABLET | Freq: Every evening | ORAL | Status: DC | PRN

## 2020-01-20 MED ORDER — ONDANSETRON HCL 4 MG/2ML IJ SOLN
INTRAMUSCULAR | Status: AC
  Filled 2020-01-20: qty 4

## 2020-01-20 MED ORDER — HEPARIN SODIUM (PORCINE) 5000 UNIT/ML IJ SOLN
5000.0000 [IU] | Freq: Three times a day (TID) | INTRAMUSCULAR | Status: DC
  Administered 2020-01-21 – 2020-01-22 (×5): 5000 [IU] via SUBCUTANEOUS
  Filled 2020-01-20 (×6): qty 1

## 2020-01-20 MED ORDER — MUPIROCIN 2 % EX OINT
TOPICAL_OINTMENT | CUTANEOUS | Status: AC
  Administered 2020-01-20: 1 via TOPICAL
  Filled 2020-01-20: qty 22

## 2020-01-20 MED ORDER — ONDANSETRON HCL 4 MG/2ML IJ SOLN: 4.0000 mg | Freq: Four times a day (QID) | INTRAMUSCULAR | Status: DC | PRN

## 2020-01-20 MED ORDER — ROCURONIUM BROMIDE 10 MG/ML (PF) SYRINGE
PREFILLED_SYRINGE | INTRAVENOUS | Status: AC
  Filled 2020-01-20: qty 10

## 2020-01-20 MED ORDER — CLONAZEPAM 0.25 MG PO TBDP
0.2500 mg | ORAL_TABLET | Freq: Two times a day (BID) | ORAL | Status: DC | PRN
  Administered 2020-01-21 – 2020-01-22 (×2): 0.25 mg via ORAL
  Filled 2020-01-20 (×2): qty 1

## 2020-01-20 MED ORDER — DEXAMETHASONE SODIUM PHOSPHATE 10 MG/ML IJ SOLN
INTRAMUSCULAR | Status: DC | PRN
  Administered 2020-01-20: 5 mg via INTRAVENOUS

## 2020-01-20 MED ORDER — OXYCODONE-ACETAMINOPHEN 5-325 MG PO TABS
1.0000 | ORAL_TABLET | ORAL | Status: DC | PRN
  Administered 2020-01-21: 1 via ORAL
  Administered 2020-01-21: 2 via ORAL
  Administered 2020-01-22 (×2): 1 via ORAL
  Administered 2020-01-22: 2 via ORAL
  Filled 2020-01-20 (×2): qty 1
  Filled 2020-01-20: qty 2
  Filled 2020-01-20: qty 1
  Filled 2020-01-20: qty 2

## 2020-01-20 MED ORDER — IRBESARTAN 300 MG PO TABS
300.0000 mg | ORAL_TABLET | Freq: Every day | ORAL | Status: DC
  Administered 2020-01-20 – 2020-01-22 (×3): 300 mg via ORAL
  Filled 2020-01-20 (×3): qty 1

## 2020-01-20 MED ORDER — ALUM & MAG HYDROXIDE-SIMETH 200-200-20 MG/5ML PO SUSP: 15.0000 mL | ORAL | Status: DC | PRN

## 2020-01-20 MED ORDER — HEPARIN SODIUM (PORCINE) 1000 UNIT/ML IJ SOLN
INTRAMUSCULAR | Status: AC
  Filled 2020-01-20: qty 1

## 2020-01-20 SURGICAL SUPPLY — 64 items
ADH SKN CLS APL DERMABOND .7 (GAUZE/BANDAGES/DRESSINGS) ×1
BANDAGE ESMARK 6X9 LF (GAUZE/BANDAGES/DRESSINGS) IMPLANT
BNDG CMPR 9X6 STRL LF SNTH (GAUZE/BANDAGES/DRESSINGS) ×1
BNDG ESMARK 6X9 LF (GAUZE/BANDAGES/DRESSINGS) ×2
CANISTER SUCT 3000ML PPV (MISCELLANEOUS) ×2 IMPLANT
CANNULA VESSEL 3MM 2 BLNT TIP (CANNULA) ×2 IMPLANT
CLIP VESOCCLUDE MED 24/CT (CLIP) ×2 IMPLANT
CLIP VESOCCLUDE SM WIDE 24/CT (CLIP) ×2 IMPLANT
CUFF TOURN SGL QUICK 24 (TOURNIQUET CUFF) ×2
CUFF TOURN SGL QUICK 34 (TOURNIQUET CUFF)
CUFF TOURN SGL QUICK 42 (TOURNIQUET CUFF) IMPLANT
CUFF TRNQT CYL 24X4X16.5-23 (TOURNIQUET CUFF) IMPLANT
CUFF TRNQT CYL 34X4.125X (TOURNIQUET CUFF) IMPLANT
DERMABOND ADVANCED (GAUZE/BANDAGES/DRESSINGS) ×1
DERMABOND ADVANCED .7 DNX12 (GAUZE/BANDAGES/DRESSINGS) ×1 IMPLANT
DRAIN CHANNEL 15F RND FF W/TCR (WOUND CARE) IMPLANT
DRAPE HALF SHEET 40X57 (DRAPES) IMPLANT
DRAPE X-RAY CASS 24X20 (DRAPES) IMPLANT
ELECT REM PT RETURN 9FT ADLT (ELECTROSURGICAL) ×2
ELECTRODE REM PT RTRN 9FT ADLT (ELECTROSURGICAL) ×1 IMPLANT
EVACUATOR SILICONE 100CC (DRAIN) IMPLANT
GLOVE BIOGEL PI IND STRL 7.5 (GLOVE) ×1 IMPLANT
GLOVE BIOGEL PI INDICATOR 7.5 (GLOVE) ×2
GLOVE SURG SS PI 6.5 STRL IVOR (GLOVE) ×4 IMPLANT
GLOVE SURG SS PI 7.5 STRL IVOR (GLOVE) ×4 IMPLANT
GOWN STRL REUS W/ TWL LRG LVL3 (GOWN DISPOSABLE) ×2 IMPLANT
GOWN STRL REUS W/ TWL XL LVL3 (GOWN DISPOSABLE) ×1 IMPLANT
GOWN STRL REUS W/TWL LRG LVL3 (GOWN DISPOSABLE) ×4
GOWN STRL REUS W/TWL XL LVL3 (GOWN DISPOSABLE) ×2
GRAFT PROPATEN W/RING 6X80X60 (Vascular Products) ×1 IMPLANT
HEMOSTAT SNOW SURGICEL 2X4 (HEMOSTASIS) ×2 IMPLANT
INSERT FOGARTY SM (MISCELLANEOUS) IMPLANT
KIT BASIN OR (CUSTOM PROCEDURE TRAY) ×2 IMPLANT
KIT TURNOVER KIT B (KITS) ×2 IMPLANT
LOOP VESSEL MINI RED (MISCELLANEOUS) ×1 IMPLANT
MARKER GRAFT CORONARY BYPASS (MISCELLANEOUS) IMPLANT
NS IRRIG 1000ML POUR BTL (IV SOLUTION) ×4 IMPLANT
PACK PERIPHERAL VASCULAR (CUSTOM PROCEDURE TRAY) ×2 IMPLANT
PAD ARMBOARD 7.5X6 YLW CONV (MISCELLANEOUS) ×4 IMPLANT
PENCIL SMOKE EVACUATOR (MISCELLANEOUS) ×1 IMPLANT
SET COLLECT BLD 21X3/4 12 (NEEDLE) IMPLANT
STOPCOCK 4 WAY LG BORE MALE ST (IV SETS) IMPLANT
SUT ETHILON 3 0 PS 1 (SUTURE) IMPLANT
SUT GORETEX 6.0 TT13 (SUTURE) IMPLANT
SUT GORETEX 6.0 TT9 (SUTURE) IMPLANT
SUT PROLENE 5 0 C 1 24 (SUTURE) ×3 IMPLANT
SUT PROLENE 6 0 BV (SUTURE) ×7 IMPLANT
SUT PROLENE 7 0 BV 1 (SUTURE) IMPLANT
SUT SILK 2 0 (SUTURE) ×2
SUT SILK 2 0 SH (SUTURE) ×2 IMPLANT
SUT SILK 2-0 18XBRD TIE 12 (SUTURE) IMPLANT
SUT SILK 3 0 (SUTURE)
SUT SILK 3-0 18XBRD TIE 12 (SUTURE) IMPLANT
SUT VIC AB 2-0 CT1 27 (SUTURE) ×6
SUT VIC AB 2-0 CT1 TAPERPNT 27 (SUTURE) ×2 IMPLANT
SUT VIC AB 3-0 SH 27 (SUTURE) ×6
SUT VIC AB 3-0 SH 27X BRD (SUTURE) ×2 IMPLANT
SUT VIC AB 4-0 PS2 18 (SUTURE) ×2 IMPLANT
SUT VICRYL 4-0 PS2 18IN ABS (SUTURE) ×4 IMPLANT
TOWEL GREEN STERILE (TOWEL DISPOSABLE) ×2 IMPLANT
TRAY FOLEY MTR SLVR 16FR STAT (SET/KITS/TRAYS/PACK) ×2 IMPLANT
TUBING EXTENTION W/L.L. (IV SETS) IMPLANT
UNDERPAD 30X30 (UNDERPADS AND DIAPERS) ×2 IMPLANT
WATER STERILE IRR 1000ML POUR (IV SOLUTION) ×2 IMPLANT

## 2020-01-20 NOTE — Anesthesia Preprocedure Evaluation (Signed)
Anesthesia Evaluation  Patient identified by MRN, date of birth, ID band Patient awake    Reviewed: Allergy & Precautions, H&P , NPO status , Patient's Chart, lab work & pertinent test results, reviewed documented beta blocker date and time   History of Anesthesia Complications Negative for: history of anesthetic complications  Airway Mallampati: I  TM Distance: >3 FB Neck ROM: full    Dental  (+) Teeth Intact, Dental Advisory Given, Partial Lower   Pulmonary Current Smoker and Patient abstained from smoking.,    Pulmonary exam normal breath sounds clear to auscultation       Cardiovascular hypertension, Pt. on medications and Pt. on home beta blockers + CAD (60% L main), + Past MI, + CABG, + Peripheral Vascular Disease and +CHF  Normal cardiovascular exam Rhythm:Irregular Rate:Bradycardia  11/14 ECHO: EF 40-45%, severe antero-lateral and antero-septal hypokinesis   Neuro/Psych PSYCHIATRIC DISORDERS Anxiety Depression negative neurological ROS     GI/Hepatic negative GI ROS, Neg liver ROS,   Endo/Other  negative endocrine ROSprediabetes  Renal/GU negative Renal ROS  negative genitourinary   Musculoskeletal  (+) Arthritis ,   Abdominal Normal abdominal exam  (+)   Peds  Hematology negative hematology ROS (+)   Anesthesia Other Findings   Reproductive/Obstetrics negative OB ROS                             Anesthesia Physical  Anesthesia Plan  ASA: III  Anesthesia Plan: General   Post-op Pain Management:    Induction: Intravenous  PONV Risk Score and Plan: 2 and Ondansetron, Dexamethasone and Treatment may vary due to age or medical condition  Airway Management Planned: Oral ETT  Additional Equipment:   Intra-op Plan:   Post-operative Plan: Extubation in OR  Informed Consent: I have reviewed the patients History and Physical, chart, labs and discussed the procedure including  the risks, benefits and alternatives for the proposed anesthesia with the patient or authorized representative who has indicated his/her understanding and acceptance.     Dental advisory given  Plan Discussed with: CRNA  Anesthesia Plan Comments: (PAT note written 01/19/2020 by Shonna Chock, PA-C. Dr. Myra Gianotti has discussed surgery plans with Dr. Conchita Paris given recent history of SDH. story recent SDH.  )       Anesthesia Quick Evaluation

## 2020-01-20 NOTE — Anesthesia Postprocedure Evaluation (Signed)
Anesthesia Post Note  Patient: Lonnie [name]  Procedure(s) Performed: BYPASS GRAFT FEMORAL-POPLITEAL ARTERY RIGHT LEG USING GORE PROPATEN GRAFT (Right )     Patient location during evaluation: PACU Anesthesia Type: General Level of consciousness: sedated and patient cooperative Pain management: pain level controlled Vital Signs Assessment: post-procedure vital signs reviewed and stable Respiratory status: spontaneous breathing Cardiovascular status: stable Anesthetic complications: no    Last Vitals:  Vitals:   01/20/20 1500 01/20/20 1530  BP: (!) 141/84 (!) 146/92  Pulse: 73 71  Resp: 20 20  Temp:    SpO2: 100% 98%    Last Pain:  Vitals:   01/20/20 1530  TempSrc:   PainSc: 0-No pain                 Lewie Loron

## 2020-01-20 NOTE — Anesthesia Procedure Notes (Signed)
Procedure Name: Intubation Date/Time: 01/20/2020 10:38 AM Performed by: Janace Litten, CRNA Pre-anesthesia Checklist: Patient identified, Emergency Drugs available, Suction available and Patient being monitored Patient Re-evaluated:Patient Re-evaluated prior to induction Oxygen Delivery Method: Circle System Utilized Preoxygenation: Pre-oxygenation with 100% oxygen Induction Type: IV induction Ventilation: Mask ventilation without difficulty Laryngoscope Size: Mac and 4 Grade View: Grade I Tube type: Oral Tube size: 7.5 mm Number of attempts: 1 Airway Equipment and Method: Stylet Placement Confirmation: ETT inserted through vocal cords under direct vision,  positive ETCO2 and breath sounds checked- equal and bilateral Secured at: 23 cm Tube secured with: Tape Dental Injury: Teeth and Oropharynx as per pre-operative assessment

## 2020-01-20 NOTE — Op Note (Signed)
Patient name: Lonnie [name] MRN: [id number] DOB: 1951-10-02 Sex: male  01/20/2020 Pre-operative Diagnosis: Right foot ulcer Post-operative diagnosis:  Same Surgeon:  Annamarie Major Assistants: Carlean Jews, C Baglia Procedure:   Right femoral to below-knee popliteal artery bypass graft with 6 mm external ring propatent PTFE Anesthesia: General Blood Loss: 100 cc Specimens: None  Findings: Proximal anastomosis was to the distal common femoral artery.  Initially, I planned on an above-knee popliteal bypass however after exposing the above-knee popliteal artery which reconstituted at the level of the patella, the artery was circumferentially calcified and I did not think it was a good target and so the anastomosis was to the below-knee popliteal artery which was also calcified but did have soft areas which were amenable to anastomosis.  Indications: 2 days ago the patient underwent angiography by Dr. Fletcher Anon for a wound on his right foot between his fourth and fifth toes.  He was found to have a flush occlusion of the superficial femoral artery.  Surgical revascularization was recommended.  The patient saphenous vein has been harvested for CABG.  The risk and benefits of the surgery were discussed with the patient and his daughter via telephone.  Procedure:  The patient was identified in the holding area and taken to [place] [address]  The patient was then placed supine on the table. general anesthesia was administered.  The patient was prepped and draped in the usual sterile fashion.  A time out was called and antibiotics were administered.  Dr. Scot Dock performed the common femoral artery exposure and proximal anastomosis.  I performed the distal anastomosis.  A longitudinal incision was made in the right groin.  Cautery was used about subcutaneous tissue down the femoral sheath which was opened sharply.  The common femoral profundofemoral artery were isolated and encircled with Vesseloops.  A medial  above-knee incision was made.  Cautery is used about subcutaneous tissue and the muscle fascia was divided with cautery.  I entered the popliteal space and identified the popliteal artery at the level of the joint space.  This was circumferentially calcified.  I did not feel that this would be an adequate target and so I then made a medial below-knee incision.  Cautery was used about subcutaneous tissue.  The fascia was divided with cautery.  I entered the popliteal space.  Soleal attachments of the tibia were taken down with cautery.  I then exposed the popliteal artery which was of adequate caliber about 5 mm.  There were soft areas and heavily calcified areas at the level of the anastomosis.  Next a long Gore tunneler was used to create a subsartorial tunnel.  The patient was fully heparinized.  A 6 mm external ring propatent PTFE graft was brought through the tunneler which was then removed.  A tourniquet was then placed on the mid thigh.  The leg was exsanguinated with an Esmarch and a turning was taken to in a 50 mm of pressure.  The common femoral artery was then opened with 11 blade and extended longitudinally with Potts scissors.  The graft was beveled to fit the size the arteriotomy in a running anastomosis was created with 5-0 Prolene.  Simultaneously, the below-knee popliteal artery was open.  The artery was thick and some areas and heavily calcified in the proximal portion of the arteriotomy.  Distally however the artery had a good lumen and was soft.  The graft was cut to the appropriate length and beveled to fit the size the  arteriotomy.  Anastomosis was created with 6-0 Prolene.  Just prior to completion the tourniquet was let down.  The appropriate flushing maneuvers were performed.  The graft was flushed.  The anastomosis was then completed.  There was a brisk Doppler signal distal to the bypass graft and a biphasic posterior tibial signal that was graft dependent.  The patient's heparin was  reversed with 50 mg of protamine.  The wounds were irrigated.  Hemostasis was achieved.  The groin was closed with multiple layers of 2-0 and 3-0 Vicryl followed by 4-0 subcuticular suture.  The medial above-knee and below-knee incisions were closed by reapproximating the fascia with 2-0 Vicryl, subcutaneous tissue with 3-0 Vicryl, and the skin with 4-0 Vicryl.  Dermabond was placed on all the incisions.  The patient was then successfully extubated taken to recovery in stable condition.  There were no immediate complications.   Disposition: To PACU stable.   Juleen China, M.D., [facility] [place] Office: [phone] Pager:  [phone]

## 2020-01-20 NOTE — Transfer of Care (Signed)
Immediate Anesthesia Transfer of Care Note  Patient: Lonnie [name]  Procedure(s) Performed: BYPASS GRAFT FEMORAL-POPLITEAL ARTERY RIGHT LEG USING GORE PROPATEN GRAFT (Right )  Patient Location: PACU  Anesthesia Type:General  Level of Consciousness: drowsy and patient cooperative  Airway & Oxygen Therapy: Patient Spontanous Breathing  Post-op Assessment: Report given to RN and Post -op Vital signs reviewed and stable  Post vital signs: Reviewed and stable  Last Vitals:  Vitals Value Taken Time  BP 98/57 01/20/20 1253  Temp    Pulse 69 01/20/20 1255  Resp 23 01/20/20 1255  SpO2 99 % 01/20/20 1255  Vitals shown include unvalidated device data.  Last Pain:  Vitals:   01/20/20 0857  TempSrc:   PainSc: 7       Patients Stated Pain Goal: 5 (01/20/20 0857)  Complications: No apparent anesthesia complications

## 2020-01-20 NOTE — Interval H&P Note (Signed)
History and Physical Interval Note:  01/20/2020 8:48 AM  Lonnie [name]  has presented today for surgery, with the diagnosis of PERIPHERAL VASCULAR DISEASE WITH ULCER RIGHT LEG.  The various methods of treatment have been discussed with the patient and family. After consideration of risks, benefits and other options for treatment, the patient has consented to  Procedure(s): BYPASS GRAFT FEMORAL-POPLITEAL ARTERY RIGHT LEG (Right) as a surgical intervention.  The patient's history has been reviewed, patient examined, no change in status, stable for surgery.  I have reviewed the patient's chart and labs.  Questions were answered to the patient's satisfaction.     [name]

## 2020-01-21 ENCOUNTER — Encounter: Payer: Self-pay | Admitting: Internal Medicine

## 2020-01-21 LAB — BASIC METABOLIC PANEL
Anion gap: 11 (ref 5–15)
BUN: 16 mg/dL (ref 8–23)
CO2: 22 mmol/L (ref 22–32)
Calcium: 9 mg/dL (ref 8.9–10.3)
Chloride: 106 mmol/L (ref 98–111)
Creatinine, Ser: 1.11 mg/dL (ref 0.61–1.24)
GFR calc Af Amer: >60 mL/min (ref >60)
GFR calc non Af Amer: >60 mL/min (ref >60)
Glucose, Bld: 112 mg/dL — ABNORMAL HIGH (ref 70–99)
Potassium: 4.1 mmol/L (ref 3.5–5.1)
Sodium: 139 mmol/L (ref 135–145)

## 2020-01-21 LAB — CBC
HCT: 38.6 % — ABNORMAL LOW (ref 39.0–52.0)
Hemoglobin: 12.4 g/dL — ABNORMAL LOW (ref 13.0–17.0)
MCH: 27.6 pg (ref 26.0–34.0)
MCHC: 32.1 g/dL (ref 30.0–36.0)
MCV: 85.8 fL (ref 80.0–100.0)
Platelets: 216 10*3/uL (ref 150–400)
RBC: 4.5 MIL/uL (ref 4.22–5.81)
RDW: 13.1 % (ref 11.5–15.5)
WBC: 11.8 10*3/uL — ABNORMAL HIGH (ref 4.0–10.5)
nRBC: 0 % (ref 0.0–0.2)

## 2020-01-21 MED ORDER — CLOPIDOGREL BISULFATE 75 MG PO TABS
75.0000 mg | ORAL_TABLET | Freq: Every day | ORAL | Status: DC
  Administered 2020-01-21 – 2020-01-23 (×3): 75 mg via ORAL
  Filled 2020-01-21 (×3): qty 1

## 2020-01-21 MED ORDER — NICOTINE 14 MG/24HR TD PT24
14.0000 mg | MEDICATED_PATCH | Freq: Every day | TRANSDERMAL | Status: DC
  Filled 2020-01-21 (×2): qty 1

## 2020-01-21 NOTE — Progress Notes (Signed)
Attempted to speak to the patient and patient ordered me away from the room before I even entered. Patient agitated and wanting to go home. Relayed information to patient's RN.
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
## 2020-01-21 NOTE — Progress Notes (Signed)
Pt becoming agitated, and mildly threatening, saying call this RN "dumb as my wife."  Main complaint is "this is no place to heal.  This is a hell hole!"  Sudden and dramatic change in mood and behavior over the last hr.  Will attempt to con't plan of care.

## 2020-01-21 NOTE — Progress Notes (Addendum)
  Progress Note    01/21/2020 7:49 AM 1 Day Post-Op  Subjective:  Wants to go home   Vitals:   01/21/20 0043 01/21/20 0423  BP: 140/83 119/67  Pulse: 89   Resp: 15 12  Temp: 97.8 F (36.6 C) 97.8 F (36.6 C)  SpO2: 97% 96%   Physical Exam: Lungs:  Non labored Incisions:  Groin and popliteal incision c/d/i Extremities:  Palpable R PT pulse; 4,5 toes with superficial wounds, no obvious infection Neurologic: A&O  CBC    Component Value Date/Time   WBC 7.4 01/20/2020 0823   RBC 5.18 01/20/2020 0823   HGB 14.2 01/20/2020 0823   HGB 15.7 01/10/2020 1146   HCT 45.4 01/20/2020 0823   HCT 46.1 01/10/2020 1146   PLT 247 01/20/2020 0823   PLT 232 01/10/2020 1146   MCV 87.6 01/20/2020 0823   MCV 83 01/10/2020 1146   MCH 27.4 01/20/2020 0823   MCHC 31.3 01/20/2020 0823   RDW 13.1 01/20/2020 0823   RDW 12.8 01/10/2020 1146   LYMPHSABS 2.5 07/09/2019 0525   LYMPHSABS 2.5 03/01/2015 1414   MONOABS 0.8 07/09/2019 0525   EOSABS 0.4 07/09/2019 0525   EOSABS 0.4 03/01/2015 1414   BASOSABS 0.1 07/09/2019 0525   BASOSABS 0.1 03/01/2015 1414    BMET    Component Value Date/Time   NA 140 01/20/2020 0823   NA 142 01/10/2020 1146   K 4.0 01/20/2020 0823   CL 106 01/20/2020 0823   CO2 23 01/20/2020 0823   GLUCOSE 102 (H) 01/20/2020 0823   BUN 20 01/20/2020 0823   BUN 19 01/10/2020 1146   CREATININE 1.11 01/20/2020 0823   CALCIUM 9.7 01/20/2020 0823   GFRNONAA >60 01/20/2020 0823   GFRAA >60 01/20/2020 0823    INR    Component Value Date/Time   INR 0.9 01/20/2020 0823     Intake/Output Summary (Last 24 hours) at 01/21/2020 0749 Last data filed at 01/20/2020 2031 Gross per 24 hour  Intake 1650 ml  Output 1210 ml  Net 440 ml     Assessment/Plan:  69 y.o. male is s/p R femoral to BK pop bypass with PTFE 1 Day Post-Op   Perfusing R foot well Ambulate patient this morning Add plavix Home when mobility increased and pain controlled   Emilie Rutter, PA-C  [facility] [phone] 01/21/2020 7:49 AM   I have interviewed and examined patient with PA and agree with assessment and plan above.   Carter Kaman C. Randie Heinz, MD [facility] [place] Office: [phone] Pager: [phone]

## 2020-01-21 NOTE — Progress Notes (Signed)
Patient agitated and demanding to go home, refusing all medication and using threatening verbal and body language. Contacted daughter [name], she offered suggestions spoke with patient in an effort to de-escalate him. On-call physician notified orders given.
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
## 2020-01-22 NOTE — Progress Notes (Addendum)
  Progress Note    01/22/2020 9:02 AM 2 Days Post-Op  Subjective:  Agitated overnight with sun-downing affect; More pleasant this morning.  Not agreeable to SNF.   Vitals:   01/22/20 0329 01/22/20 0749  BP: 138/88 128/70  Pulse: 75 71  Resp: 16   Temp: 98.8 F (37.1 C) 99.1 F (37.3 C)  SpO2: 97% 100%   Physical Exam: Lungs:  Non labored Incisions:  R groin and incisions of RLE c/d/i Extremities:  Palpable R PT pulse Neurologic: A&O  CBC    Component Value Date/Time   WBC 11.8 (H) 01/21/2020 0729   RBC 4.50 01/21/2020 0729   HGB 12.4 (L) 01/21/2020 0729   HGB 15.7 01/10/2020 1146   HCT 38.6 (L) 01/21/2020 0729   HCT 46.1 01/10/2020 1146   PLT 216 01/21/2020 0729   PLT 232 01/10/2020 1146   MCV 85.8 01/21/2020 0729   MCV 83 01/10/2020 1146   MCH 27.6 01/21/2020 0729   MCHC 32.1 01/21/2020 0729   RDW 13.1 01/21/2020 0729   RDW 12.8 01/10/2020 1146   LYMPHSABS 2.5 07/09/2019 0525   LYMPHSABS 2.5 03/01/2015 1414   MONOABS 0.8 07/09/2019 0525   EOSABS 0.4 07/09/2019 0525   EOSABS 0.4 03/01/2015 1414   BASOSABS 0.1 07/09/2019 0525   BASOSABS 0.1 03/01/2015 1414    BMET    Component Value Date/Time   NA 139 01/21/2020 0729   NA 142 01/10/2020 1146   K 4.1 01/21/2020 0729   CL 106 01/21/2020 0729   CO2 22 01/21/2020 0729   GLUCOSE 112 (H) 01/21/2020 0729   BUN 16 01/21/2020 0729   BUN 19 01/10/2020 1146   CREATININE 1.11 01/21/2020 0729   CALCIUM 9.0 01/21/2020 0729   GFRNONAA >60 01/21/2020 0729   GFRAA >60 01/21/2020 0729    INR    Component Value Date/Time   INR 0.9 01/20/2020 0823     Intake/Output Summary (Last 24 hours) at 01/22/2020 0902 Last data filed at 01/21/2020 0915 Gross per 24 hour  Intake 240 ml  Output --  Net 240 ml     Assessment/Plan:  69 y.o. male is s/p R femoral to BK pop bypass with PTFE 2 Days Post-Op   R foot well perfused with palpable PT Will allow time for 4th and 5th toe wounds to heal Spoke with daughter  over the phone today; Family not agreeable to SNF placement but would be comfortable with discharge to home with [facility] PT tomorrow I have asked PT to re-evaluate for DME needs and also consulted Case manager   Emilie Rutter, PA-C [facility] [phone] 01/22/2020 9:02 AM  I have independently interviewed and evaluated patient and agree with PA assessment and plan above.   Nataya Bastedo C. Randie Heinz, MD [facility] [place] Office: [phone] Pager: [phone]
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
## 2020-01-23 ENCOUNTER — Encounter: Payer: Self-pay | Admitting: *Deleted

## 2020-01-23 ENCOUNTER — Inpatient Hospital Stay (HOSPITAL_COMMUNITY): Payer: Medicare Other

## 2020-01-23 ENCOUNTER — Telehealth: Payer: Self-pay | Admitting: Internal Medicine

## 2020-01-23 MED ORDER — CLOPIDOGREL BISULFATE 75 MG PO TABS: 75.0000 mg | ORAL_TABLET | Freq: Every day | ORAL | 11 refills | Status: DC

## 2020-01-23 MED ORDER — QUETIAPINE FUMARATE 200 MG PO TABS: 200.0000 mg | ORAL_TABLET | Freq: Every day | ORAL | 5 refills | Status: DC

## 2020-01-23 NOTE — Progress Notes (Signed)
Patient in a stable condition, discharge education reviewed with patient's daughter [name] on the phone she verbalized understanding, patient belongings at bedside, patient to be transported home by his daughter

## 2020-01-23 NOTE — Progress Notes (Signed)
  Progress Note    01/23/2020 7:11 AM 3 Days Post-Op  Subjective:  Out of bed to chair this morning.  Not oriented to time, but follows commands.   Vitals:   01/22/20 0749 01/22/20 1234  BP: 128/70 106/70  Pulse: 71 75  Resp:  16  Temp: 99.1 F (37.3 C) 99.3 F (37.4 C)  SpO2: 100% 98%    Physical Exam: Cardiac:  RRR Lungs:  Non-labored Incisions:  Right groin and lower leg incisions well approximated. Extremities:  Mild right lower leg edema, brisk DP, PT and peroneal Doppler signals. Right 5th web space ulcer  CBC    Component Value Date/Time   WBC 11.8 (H) 01/21/2020 0729   RBC 4.50 01/21/2020 0729   HGB 12.4 (L) 01/21/2020 0729   HGB 15.7 01/10/2020 1146   HCT 38.6 (L) 01/21/2020 0729   HCT 46.1 01/10/2020 1146   PLT 216 01/21/2020 0729   PLT 232 01/10/2020 1146   MCV 85.8 01/21/2020 0729   MCV 83 01/10/2020 1146   MCH 27.6 01/21/2020 0729   MCHC 32.1 01/21/2020 0729   RDW 13.1 01/21/2020 0729   RDW 12.8 01/10/2020 1146   LYMPHSABS 2.5 07/09/2019 0525   LYMPHSABS 2.5 03/01/2015 1414   MONOABS 0.8 07/09/2019 0525   EOSABS 0.4 07/09/2019 0525   EOSABS 0.4 03/01/2015 1414   BASOSABS 0.1 07/09/2019 0525   BASOSABS 0.1 03/01/2015 1414    BMET    Component Value Date/Time   NA 139 01/21/2020 0729   NA 142 01/10/2020 1146   K 4.1 01/21/2020 0729   CL 106 01/21/2020 0729   CO2 22 01/21/2020 0729   GLUCOSE 112 (H) 01/21/2020 0729   BUN 16 01/21/2020 0729   BUN 19 01/10/2020 1146   CREATININE 1.11 01/21/2020 0729   CALCIUM 9.0 01/21/2020 0729   GFRNONAA >60 01/21/2020 0729   GFRAA >60 01/21/2020 0729    No intake or output data in the 24 hours ending 01/23/20 0711  HOSPITAL MEDICATIONS Scheduled Meds: . aspirin EC  81 mg Oral Daily  . atorvastatin  80 mg Oral q1800  . calcium-vitamin D  1 tablet Oral Daily  . clopidogrel  75 mg Oral Daily  . docusate sodium  100 mg Oral Daily  . fluticasone  2 spray Each Nare Daily  . gabapentin  100 mg Oral  BID  . heparin  5,000 Units Subcutaneous Q8H  . irbesartan  300 mg Oral QHS  . loratadine  10 mg Oral Daily  . metoprolol tartrate  25 mg Oral BID  . nicotine  14 mg Transdermal Daily  . pantoprazole  40 mg Oral QHS  . povidone-Iodine   Topical Daily  . QUEtiapine  150 mg Oral BID  . tamsulosin  0.8 mg Oral QPC supper   Continuous Infusions: . sodium chloride    . magnesium sulfate bolus IVPB     PRN Meds:.sodium chloride, acetaminophen **OR** acetaminophen, alum & mag hydroxide-simeth, bisacodyl, clonazePAM, guaiFENesin-dextromethorphan, hydrALAZINE, HYDROmorphone (DILAUDID) injection, labetalol, magnesium sulfate bolus IVPB, metoprolol tartrate, ondansetron, oxyCODONE-acetaminophen, phenol, potassium chloride, senna-docusate, sodium phosphate, zolpidem        Assessment:  69 y.o. male is s/p: Right femoral to below-knee popliteal artery bypass graft with 6 mm external ring propatent PTFE 3 Days Post-Op  Plan: -Discharge home. Keep right foot dry and place dry gauze between 4th and 5th toe space daily.    Wendi Maya, PA-C [facility] [phone] 01/23/2020  7:11 AM

## 2020-01-23 NOTE — TOC Transition Note (Signed)
Transition of Care [facility]) - CM/SW Discharge Note Donn Pierini RN, BSN Transitions of Care Unit 4E- RN Case Manager [phone]   Patient Details  Name: Lonnie [name] MRN: [id number] Date of Birth: 1951/06/23  [facility] of [facility]) CM/SW Contact:  Darrold Span, RN Phone Number: 01/23/2020, 11:57 AM   Clinical Narrative:    Pt stable for transition home today, call made to daughter [name] to discuss transition needs- per TC she states pt has all needed DME at home including RW, BSC, elevated toilet seat, W/C, shower seat. - pt does not need new WC at this time. Choice offered for [facility] agency- per daughter they have used [facility] in past and would like to use them again - if [facility] unable to accept referral then she states they do not have a preference for alternate. Daughter to provide transport home. Address, phone # and PCP all confirmed with daughter. Call made to [facility] with [facility] for [facility] referral- PT/aide- referral has been accepted.    Final next level of care: Home w Home Health Services Barriers to Discharge: No Barriers Identified   Patient Goals and CMS Choice Patient states their goals for this hospitalization and ongoing recovery are:: return home CMS Medicare.gov Compare Post Acute Care list provided to:: Patient Represenative (must comment)(daughter) Choice offered to / list presented to : Adult Children  Discharge Placement               Home with [facility]        Discharge Plan and Services   Discharge Planning Services: CM Consult Post Acute Care Choice: Durable Medical Equipment, Home Health          DME Arranged: N/A DME Agency: NA       HH Arranged: PT, Nurse's Aide HH Agency: [facility] Date HH Agency Contacted: 01/23/20 Time HH Agency Contacted: 1100 Representative spoke with at [facility] Agency: Lupita Leash  Social Determinants of Health (SDOH) Interventions     Readmission Risk Interventions No flowsheet data found.
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
## 2020-01-23 NOTE — Telephone Encounter (Signed)
Spoke w/ Pt's daughterSunny [name]- informed of med change. She informed she has picked up the new Seroquel dose at pharmacy and is on her way to pick up Pt at the hospital now.

## 2020-01-23 NOTE — Telephone Encounter (Signed)
CallerSunny [name] Phone: [phone]  Lonnie [name] would like to proceed with Seroquel 200mg  twice per day. Please send in RX to:  [id number] , [place] - [address] Phone:  [phone]  Fax:  [phone]     [phone] said thank you so much!
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
## 2020-01-23 NOTE — Telephone Encounter (Signed)
See patient's message, the daughter called back and likes to proceed with increased dose of Seroquel. Please call her back, I sent a new prescription, is Seroquel 200 mg daily only 1 tablet twice a day. Recommend to discard the Seroquel 50 mg tablets

## 2020-01-23 NOTE — Telephone Encounter (Signed)
Pt currently admitted still--PCP made aware-- per Dr. Drue Novel changes will not take effect until he is home.

## 2020-01-27 ENCOUNTER — Ambulatory Visit: Payer: Medicare Other | Admitting: Podiatry

## 2020-01-27 ENCOUNTER — Other Ambulatory Visit: Payer: Self-pay

## 2020-01-27 DIAGNOSIS — L97512 Non-pressure chronic ulcer of other part of right foot with fat layer exposed: Secondary | ICD-10-CM | POA: Diagnosis not present

## 2020-01-27 DIAGNOSIS — I739 Peripheral vascular disease, unspecified: Secondary | ICD-10-CM

## 2020-01-30 ENCOUNTER — Ambulatory Visit: Payer: Medicare Other | Admitting: Surgery

## 2020-01-30 ENCOUNTER — Encounter: Payer: Self-pay | Admitting: Podiatry
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
## 2020-01-30 NOTE — Discharge Summary (Signed)
Bypass Discharge Summary Patient ID: Lonnie [name] [id number] 69 y.o. Sep 03, 1951  Admit date: 01/20/2020  Discharge date and time: 01/23/2020  2:34 PM   Admitting Physician: Serafina Mitchell, MD   Discharge Physician: Dr. Trula Slade  Admission Diagnoses: PAD (peripheral artery disease) [facility]) [I73.9]  Discharge Diagnoses: PAD  Admission Condition: good  Discharged Condition: good  Indication for Admission: PAD, right foot ulcer  Hospital Course: The patient is a 69 year old male with a history of peripheral arterial disease who presented for right femoral to below-knee popliteal bypass with PTFE graft.  On the day of admission he was taken to the operating room where he underwent the above procedure.  He tolerated the procedure well.  On postoperative day 1, his vital signs were stable and he was afebrile.  His groin and popliteal incisions were clean, dry and well approximated.  He had a palpable right posterior tibial pulse.  On postoperative day 2, he was noted to have some periods of agitation, but followed commands well.  There was consideration to skilled nursing facility; however, his family preferred discharge home with home health.  Case management, OT and PT were consulted.  On postoperative day 3, he was doing well with Dopplerable AT, PT and peroneal artery pulses, clean incisions and stable right 4th and 5th toe web space ulceration.  Consults: None  Treatments: surgery: right fem to BK pop bypass with PTFE   Disposition: Discharge disposition: 01-Home or Self Care       - For [facility] use ---  Post-op:  Wound infection: No  Graft infection: No  Transfusion: No  If yes, n/a units given New Arrhythmia: No Patency judged by: [x ] Dopper only, [ ]  Palpable graft pulse, [ ]  Palpable distal pulse, [ ]  ABI inc. > 0.15, [ ]  Duplex Discharge ABI: R not done, L n/a Discharge TBI: R n/a, L n/a D/C Ambulatory Status: Wheelchair  Complications: MI: [ ]  No, [ ]   Troponin only, [ ]  EKG or Clinical CHF: No Resp failure: [ ]  none, [ ]  Pneumonia, [ ]  Ventilator Chg in renal function: [ ]  none, [ ]  Inc. Cr > 0.5, [ ]  Temp. Dialysis, [ ]  Permanent dialysis Stroke: [ ]  None, [ ]  Minor, [ ]  Major Return to OR: No  Reason for return to OR: [ ]  Bleeding, [ ]  Infection, [ ]  Thrombosis, [ ]  Revision  Discharge medications: Statin use:  Yes ASA use:  Yes Plavix use:  Yes Beta blocker use: Yes Coumadin use: No  for medical reason not indicated    Patient Instructions:  Allergies as of 01/23/2020      Reactions   Ace Inhibitors Swelling   Angioedema   Dairy Aid [lactase]    gas   Eggs Or Egg-derived Products    Cannot eat Prepared Eggs   Latex    Peanut-containing Drug Products Nausea And Vomiting   Does not know which nuts, but states nuts make him vomit      Medication List    TAKE these medications   acetaminophen 325 MG tablet Commonly known as: TYLENOL Take 2 tablets (650 mg total) by mouth 4 (four) times daily -  before meals and at bedtime. What changed:   when to take this  reasons to take this   aspirin EC 81 MG tablet Take 81 mg by mouth daily.   atorvastatin 80 MG tablet Commonly known as: LIPITOR TAKE 1 TABLET BY MOUTH  DAILY AT 6 PM What changed:  how much to take  how to take this  when to take this   BETADINE EX Apply 1 application topically daily. APPLIED TO WOUND   Calcium 600+D 600-800 MG-UNIT Tabs Generic drug: Calcium Carb-Cholecalciferol Take 1 tablet by mouth daily.   clonazePAM 0.25 MG disintegrating tablet Commonly known as: KLONOPIN Take 1 tablet (0.25 mg total) by mouth 2 (two) times daily as needed.   clopidogrel 75 MG tablet Commonly known as: PLAVIX Take 1 tablet (75 mg total) by mouth daily.   fluticasone 50 MCG/ACT nasal spray Commonly known as: FLONASE Place 2 sprays into both nostrils daily. What changed:   when to take this  reasons to take this   gabapentin 100 MG capsule  Commonly known as: NEURONTIN Take 1 capsule (100 mg total) by mouth 2 (two) times daily.   irbesartan 300 MG tablet Commonly known as: AVAPRO Take 1 tablet (300 mg total) by mouth daily.   loratadine 10 MG tablet Commonly known as: CLARITIN Take 1 tablet (10 mg total) by mouth daily.   metoprolol tartrate 25 MG tablet Commonly known as: LOPRESSOR Take 1 tablet (25 mg total) by mouth 2 (two) times daily.   pantoprazole 40 MG tablet Commonly known as: PROTONIX TAKE 1 TABLET BY MOUTH EVERYDAY AT BEDTIME What changed: See the new instructions.   QUEtiapine 200 MG tablet Commonly known as: SEROQUEL Take 1 tablet (200 mg total) by mouth at bedtime.   tamsulosin 0.4 MG Caps capsule Commonly known as: FLOMAX TAKE 2 CAPSULES BY MOUTH DAILY AFTER SUPPER What changed: See the new instructions.            Durable Medical Equipment  (From admission, onward)         Start     Ordered   01/23/20 0000  For home use only DME wheelchair cushion (seat and back)     01/23/20 0747         Activity: activity as tolerated Diet: cardiac diet Wound Care: keep wound clean and dry  Follow-up with Dr. Myra Gianotti in 2 week.  Signed: Milinda Antis 01/30/2020 12:45 PM

## 2020-02-01 ENCOUNTER — Ambulatory Visit (INDEPENDENT_AMBULATORY_CARE_PROVIDER_SITE_OTHER): Payer: Self-pay | Admitting: Physician Assistant

## 2020-02-01 ENCOUNTER — Other Ambulatory Visit: Payer: Self-pay

## 2020-02-01 ENCOUNTER — Telehealth: Payer: Self-pay

## 2020-02-01 VITALS — BP 125/85 | HR 80 | Temp 97.3°F | Resp 18 | Ht 74.0 in | Wt 180.0 lb

## 2020-02-01 DIAGNOSIS — Z95828 Presence of other vascular implants and grafts: Secondary | ICD-10-CM

## 2020-02-01 NOTE — Progress Notes (Signed)
POST OPERATIVE OFFICE NOTE    CC:  F/u for surgery  HPI:  This is a 69 y.o. male who is s/p right femoral to below knee popliteal artery bypass graft with 63mm external ring propaten PTFE on 01/20/2020 by Dr. Trula Slade for wounds on the 4th and 5th toes.  On POD 2, he had a palpable right PT pulse and going to allow time for 4th and 5th toe wounds to heal.  The family was not agreeable to SNF and he was d/c'd home with HHPT on POD 3.  He was instructed to keep right foot dry and place dry gauze b/w the toes.  Pt covid negative on 01/19/2020.  He comes in today for with complaints for his incision feeling tight and oozing.  His wife is present via telephone speaker.  He & his wife state his leg has been swollen since surgery.  He states that his incision feels tight and has been oozing a little bit of blood.  He states that his other incisions look fine.  He denies and chest pain, shortness of breath or fevers.  He feels his foot wound is improving.  He does not walk much.  He has not been elevating his foot much except a little bit of elevation at night with his foot on a pillow per his wife.    He states he wishes he could work again.  He worked as a Presenter, broadcasting for [employer].   Allergies  Allergen Reactions  . Ace Inhibitors Swelling    Angioedema  . Dairy Aid [Lactase]     gas  . Eggs Or Egg-Derived Products     Cannot eat Prepared Eggs  . Latex   . Peanut-Containing Drug Products Nausea And Vomiting    Does not know which nuts, but states nuts make him vomit    Current Outpatient Medications  Medication Sig Dispense Refill  . acetaminophen (TYLENOL) 325 MG tablet Take 2 tablets (650 mg total) by mouth 4 (four) times daily -  before meals and at bedtime. (Patient taking differently: Take 650 mg by mouth 4 (four) times daily as needed (pain.). )    . aspirin EC 81 MG tablet Take 81 mg by mouth daily.    Marland Kitchen atorvastatin (LIPITOR) 80 MG tablet TAKE 1 TABLET BY MOUTH  DAILY AT 6 PM  (Patient taking differently: Take 80 mg by mouth daily at 6 PM. TAKE 1 TABLET BY MOUTH  DAILY AT 6 PM) 90 tablet 3  . Calcium Carb-Cholecalciferol (CALCIUM 600+D) 600-800 MG-UNIT TABS Take 1 tablet by mouth daily.    . clonazePAM (KLONOPIN) 0.25 MG disintegrating tablet Take 1 tablet (0.25 mg total) by mouth 2 (two) times daily as needed. 60 tablet 1  . clopidogrel (PLAVIX) 75 MG tablet Take 1 tablet (75 mg total) by mouth daily. 30 tablet 11  . fluticasone (FLONASE) 50 MCG/ACT nasal spray Place 2 sprays into both nostrils daily. (Patient taking differently: Place 2 sprays into both nostrils daily as needed for allergies. )  2  . gabapentin (NEURONTIN) 100 MG capsule Take 1 capsule (100 mg total) by mouth 2 (two) times daily. 60 capsule 3  . irbesartan (AVAPRO) 300 MG tablet Take 1 tablet (300 mg total) by mouth daily. 30 tablet 1  . loratadine (CLARITIN) 10 MG tablet Take 1 tablet (10 mg total) by mouth daily.    . metoprolol tartrate (LOPRESSOR) 25 MG tablet Take 1 tablet (25 mg total) by mouth 2 (two) times daily.  180 tablet 1  . pantoprazole (PROTONIX) 40 MG tablet TAKE 1 TABLET BY MOUTH EVERYDAY AT BEDTIME (Patient taking differently: Take 40 mg by mouth at bedtime. ) 30 tablet 5  . Povidone-Iodine (BETADINE EX) Apply 1 application topically daily. APPLIED TO WOUND    . QUEtiapine (SEROQUEL) 200 MG tablet Take 1 tablet (200 mg total) by mouth at bedtime. 60 tablet 5  . tamsulosin (FLOMAX) 0.4 MG CAPS capsule TAKE 2 CAPSULES BY MOUTH DAILY AFTER SUPPER (Patient taking differently: Take 0.8 mg by mouth daily with supper. ) 180 capsule 0   No current facility-administered medications for this visit.     ROS:  See HPI  Physical Exam:  Today's Vitals   02/01/20 1007  BP: 125/85  Pulse: 80  Resp: 18  Temp: (!) 97.3 F (36.3 C)  TempSrc: Temporal  Weight: 180 lb (81.6 kg)  Height: 6\' 2"  (1.88 m)  PainSc: 2    Body mass index is 23.11 kg/m.   Incision:    Right groin   Right  below knee incision  Extremities:  Brisk right DP/PT/peroneal doppler signals. Right foot warm.  Right foot wound healing.      Assessment/Plan:  This is a 69 y.o. male who is s/p: right femoral to below knee popliteal artery bypass graft with 59mm external ring propaten PTFE on 01/20/2020 by Dr. 01/22/2020 who comes in today for tightness around incision and oozing from lower leg incision.  -pt's incisions look fine.  He has patent bypass with brisk right DP/PT/peroneal doppler signals.   -he does have some leg swelling that has been present since surgery & expected after bypass  He has not been elevating his legs at home much.  He does not have any shortness of breath or chest pain.   Advised pt on elevating legs several times a day and discussed with he and his wife the proper way to elevate and given brochure with picture.   -elevating legs should help with tightness & oozing around BK incision.   Otherwise, incisions look fine and no evidence of infection.  He is instructed to keep the right groin dry except when washing with soap and water to help with healing and prevent wound infection.   -continue current wound care for right foot. -continue asa/statin/plavix -he will keep his appt with Dr. Myra Gianotti on 2/22.  He will call sooner should he have any issues before then.     3/22, PA-C [facility] [phone]  Clinic MD:  [phone]

## 2020-02-01 NOTE — Telephone Encounter (Signed)
[name] called after pt's appt with Lelon Mast, PA this morning. She was wondering if the bleeding from pt's incision below knee was from Plavix. Told her Lelon Mast does not think oozing is from Plavix and likely from the swelling. After swelling goes down she feels the oozing will stop. No further questions/concerns at this time.

## 2020-02-02 ENCOUNTER — Other Ambulatory Visit: Payer: Self-pay | Admitting: Podiatry

## 2020-02-02 DIAGNOSIS — L97512 Non-pressure chronic ulcer of other part of right foot with fat layer exposed: Secondary | ICD-10-CM

## 2020-02-07 ENCOUNTER — Ambulatory Visit: Payer: Medicare Other | Admitting: Cardiovascular Disease

## 2020-02-07 ENCOUNTER — Encounter: Payer: Self-pay | Admitting: Cardiovascular Disease

## 2020-02-07 ENCOUNTER — Other Ambulatory Visit: Payer: Self-pay

## 2020-02-07 VITALS — BP 142/84 | HR 63 | Temp 97.0°F | Ht 74.0 in | Wt 201.0 lb

## 2020-02-07 DIAGNOSIS — I10 Essential (primary) hypertension: Secondary | ICD-10-CM | POA: Diagnosis not present

## 2020-02-07 DIAGNOSIS — E785 Hyperlipidemia, unspecified: Secondary | ICD-10-CM

## 2020-02-07 DIAGNOSIS — I2510 Atherosclerotic heart disease of native coronary artery without angina pectoris: Secondary | ICD-10-CM

## 2020-02-07 DIAGNOSIS — I739 Peripheral vascular disease, unspecified: Secondary | ICD-10-CM | POA: Diagnosis not present

## 2020-02-07 DIAGNOSIS — Z720 Tobacco use: Secondary | ICD-10-CM

## 2020-02-07 NOTE — Patient Instructions (Signed)
Medication Instructions:  Your physician recommends that you continue on your current medications as directed. Please refer to the Current Medication list given to you today. *If you need a refill on your cardiac medications before your next appointment, please call your pharmacy*  Lab Work: NONE   Testing/Procedures: NONE   Follow-Up: At [facility], you and your health needs are our priority.  As part of our continuing mission to provide you with exceptional heart care, we have created designated Provider Care Teams.  These Care Teams include your primary Cardiologist (physician) and Advanced Practice Providers (APPs -  Physician Assistants and Nurse Practitioners) who all work together to provide you with the care you need, when you need it.  Your next appointment:   4 month(s)  The format for your next appointment:   In Person  Provider:   You may see DR Kirke Corin  or one of the following Advanced Practice Providers on your designated Care Team:
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
## 2020-02-10 ENCOUNTER — Ambulatory Visit: Payer: Medicare Other | Admitting: Podiatry

## 2020-02-10 ENCOUNTER — Other Ambulatory Visit: Payer: Self-pay

## 2020-02-10 ENCOUNTER — Telehealth (HOSPITAL_COMMUNITY): Payer: Self-pay

## 2020-02-10 DIAGNOSIS — L97512 Non-pressure chronic ulcer of other part of right foot with fat layer exposed: Secondary | ICD-10-CM | POA: Diagnosis not present

## 2020-02-10 DIAGNOSIS — I739 Peripheral vascular disease, unspecified: Secondary | ICD-10-CM | POA: Diagnosis not present

## 2020-02-10 DIAGNOSIS — M79671 Pain in right foot: Secondary | ICD-10-CM

## 2020-02-10 NOTE — Telephone Encounter (Signed)
The above patient or their representative was contacted and gave the following answers to these questions:         Do you have any of the following symptoms?    NO  Fever                    Cough                   Shortness of breath  Do  you have any of the following other symptoms?    muscle pain         vomiting,        diarrhea        rash         weakness        red eye        abdominal pain         bruising          bruising or bleeding              joint pain           severe headache    Have you been in contact with someone who was or has been sick in the past 2 weeks?  NO  Yes                 Unsure                         Unable to assess   Does the person that you were in contact with have any of the following symptoms?   Cough         shortness of breath           muscle pain         vomiting,            diarrhea            rash            weakness           fever            red eye           abdominal pain           bruising  or  bleeding                joint pain                severe headache                 COMMENTS OR ACTION PLAN FOR THIS PATIENT:       ALL QUESTIONS WERE ANSWERED BY [name]/[facility] 

## 2020-02-13 ENCOUNTER — Encounter: Payer: Self-pay | Admitting: Surgery

## 2020-02-13 ENCOUNTER — Other Ambulatory Visit: Payer: Self-pay

## 2020-02-13 ENCOUNTER — Ambulatory Visit (INDEPENDENT_AMBULATORY_CARE_PROVIDER_SITE_OTHER): Payer: Self-pay | Admitting: Surgery

## 2020-02-13 ENCOUNTER — Encounter: Payer: Medicare Other | Admitting: Surgery

## 2020-02-13 VITALS — BP 110/70 | HR 60 | Temp 97.4°F | Resp 20 | Ht 74.0 in | Wt 200.0 lb

## 2020-02-13 DIAGNOSIS — I70245 Atherosclerosis of native arteries of left leg with ulceration of other part of foot: Secondary | ICD-10-CM
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
## 2020-02-14 ENCOUNTER — Other Ambulatory Visit: Payer: Self-pay

## 2020-02-14 ENCOUNTER — Other Ambulatory Visit: Payer: Self-pay | Admitting: *Deleted

## 2020-02-14 DIAGNOSIS — I70245 Atherosclerosis of native arteries of left leg with ulceration of other part of foot: Secondary | ICD-10-CM

## 2020-02-14 DIAGNOSIS — Z95828 Presence of other vascular implants and grafts: Secondary | ICD-10-CM

## 2020-02-14 NOTE — Progress Notes (Signed)
Error

## 2020-02-15 ENCOUNTER — Encounter: Payer: Self-pay | Admitting: Podiatry
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
## 2020-02-16 ENCOUNTER — Ambulatory Visit: Payer: Medicare Other | Attending: Internal Medicine

## 2020-02-16 DIAGNOSIS — Z23 Encounter for immunization: Secondary | ICD-10-CM | POA: Insufficient documentation

## 2020-02-16 NOTE — Progress Notes (Signed)
   Covid-19 Vaccination Clinic  Name:  Lonnie [name]    MRN: [id number] DOB: [birth year]  02/16/2020  Mr. Steidle was observed post Covid-19 immunization for 15 minutes without incidence. He was provided with Vaccine Information Sheet and instruction to access the V-Safe system.   Mr. Poke was instructed to call 911 with any severe reactions post vaccine: Marland Kitchen Difficulty breathing  . Swelling of your face and throat  . A fast heartbeat  . A bad rash all over your body  . Dizziness and weakness    Immunizations Administered    Name Date Dose VIS Date Route   Pfizer COVID-19 Vaccine 02/16/2020  4:17 PM 0.3 mL 12/02/2019 Intramuscular   Manufacturer: [employer], [employer]   Lot: [id number]   NDC: [address]-2

## 2020-02-17 ENCOUNTER — Other Ambulatory Visit: Payer: Self-pay

## 2020-02-17 MED ORDER — IRBESARTAN 300 MG PO TABS: 300.0000 mg | ORAL_TABLET | Freq: Every day | ORAL | 3 refills | Status: DC

## 2020-02-24 ENCOUNTER — Other Ambulatory Visit: Payer: Self-pay

## 2020-02-24 ENCOUNTER — Ambulatory Visit: Payer: Medicare Other | Admitting: Podiatry

## 2020-02-24 DIAGNOSIS — L97512 Non-pressure chronic ulcer of other part of right foot with fat layer exposed: Secondary | ICD-10-CM

## 2020-02-24 DIAGNOSIS — I739 Peripheral vascular disease, unspecified: Secondary | ICD-10-CM

## 2020-02-28 ENCOUNTER — Encounter: Payer: Self-pay | Admitting: Podiatry

## 2020-02-28 ENCOUNTER — Ambulatory Visit (INDEPENDENT_AMBULATORY_CARE_PROVIDER_SITE_OTHER): Payer: Self-pay | Admitting: Physician Assistant

## 2020-02-28 ENCOUNTER — Telehealth: Payer: Self-pay | Admitting: *Deleted

## 2020-02-28 ENCOUNTER — Other Ambulatory Visit: Payer: Self-pay

## 2020-02-28 VITALS — BP 157/81 | HR 75 | Temp 98.0°F | Resp 18 | Ht 74.0 in | Wt 200.0 lb

## 2020-02-28 DIAGNOSIS — I739 Peripheral vascular disease, unspecified: Secondary | ICD-10-CM

## 2020-02-28 MED ORDER — SANTYL 250 UNIT/GM EX OINT: 1.0000 "application " | TOPICAL_OINTMENT | Freq: Every day | CUTANEOUS | 5 refills | Status: DC
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
## 2020-02-28 NOTE — Telephone Encounter (Signed)
-----   Message from Candelaria Stagers, DPM sent at 02/28/2020  7:31 AM EST ----- Regarding: Santyl dressing Hi Valery,  Can you order Santyl ointment for this patient and have it sent to their house.  Thank you

## 2020-02-28 NOTE — Telephone Encounter (Signed)
Faxed required form, clinical 02/24/2020 and demographics to [facility].

## 2020-02-28 NOTE — Progress Notes (Signed)
POST OPERATIVE OFFICE NOTE    CC:  F/u for surgery  HPI:  This is a 69 y.o. male who is s/p right femoral to below knee popliteal bypass with PTFE graft on 01/20/20 by Dr. Myra Gianotti. Denies any pain in right leg, no numbness or coldness  At the time of his last visit on 02/13/20 with Dr. Myra Gianotti he had been having unsteady gait and frequent falls which had resulted in a wound to his right foot that was not healing. He additionally had a pinhole sized area in his popliteal below knee incision that was draining and was packed with gauze. Home health was instructed on dressing changes  He was last evaluated by podiatrist Dr. Allena Katz on 02/24/20 at which time his right dorsal wound was debrided. Santyl wet- to dry dressings recommended and off loading  Today he presents with his wife with continued serous drainage from medial right leg incision. Wife states that it seemed worse yesterday with drainage yellow/greenish in appearance and surrounding redness, however better today. He continues to have right dorsal foot wound which Dr. Allena Katz is caring for. They are awaiting arrival of Santyl ointment and have been using peroxide and betadine to the wounds. HH has been coming to change the dressing 1x/week. He denies any fever or chills  He is a current smoker. Medical history is significant for coronary artery disease, s/p CABG in 2014. He has hypertension and hyperlipidemia.  Allergies  Allergen Reactions  . Ace Inhibitors Swelling    Angioedema  . Dairy Aid [Lactase]     gas  . Eggs Or Egg-Derived Products     Cannot eat Prepared Eggs  . Latex   . Peanut-Containing Drug Products Nausea And Vomiting    Does not know which nuts, but states nuts make him vomit    Current Outpatient Medications  Medication Sig Dispense Refill  . acetaminophen (TYLENOL) 325 MG tablet Take 2 tablets (650 mg total) by mouth 4 (four) times daily -  before meals and at bedtime. (Patient taking differently: Take 650 mg by  mouth 4 (four) times daily as needed (pain.). )    . aspirin EC 81 MG tablet Take 81 mg by mouth daily.    Marland Kitchen atorvastatin (LIPITOR) 80 MG tablet TAKE 1 TABLET BY MOUTH  DAILY AT 6 PM (Patient taking differently: Take 80 mg by mouth daily at 6 PM. TAKE 1 TABLET BY MOUTH  DAILY AT 6 PM) 90 tablet 3  . Calcium Carb-Cholecalciferol (CALCIUM 600+D) 600-800 MG-UNIT TABS Take 1 tablet by mouth daily.    . clonazePAM (KLONOPIN) 0.25 MG disintegrating tablet Take 1 tablet (0.25 mg total) by mouth 2 (two) times daily as needed. 60 tablet 1  . clopidogrel (PLAVIX) 75 MG tablet Take 1 tablet (75 mg total) by mouth daily. 30 tablet 11  . collagenase (SANTYL) ointment Apply 1 application topically daily. Measurements right foot ulcer 2. 0 x 1.0 x 0.3cm 30 g 5  . fluticasone (FLONASE) 50 MCG/ACT nasal spray Place 2 sprays into both nostrils daily. (Patient taking differently: Place 2 sprays into both nostrils daily as needed for allergies. )  2  . gabapentin (NEURONTIN) 100 MG capsule Take 1 capsule (100 mg total) by mouth 2 (two) times daily. 60 capsule 3  . irbesartan (AVAPRO) 300 MG tablet Take 1 tablet (300 mg total) by mouth daily. 90 tablet 3  . loratadine (CLARITIN) 10 MG tablet Take 1 tablet (10 mg total) by mouth daily.    . metoprolol  tartrate (LOPRESSOR) 25 MG tablet Take 1 tablet (25 mg total) by mouth 2 (two) times daily. 180 tablet 1  . pantoprazole (PROTONIX) 40 MG tablet TAKE 1 TABLET BY MOUTH EVERYDAY AT BEDTIME (Patient taking differently: Take 40 mg by mouth at bedtime. ) 30 tablet 5  . Povidone-Iodine (BETADINE EX) Apply 1 application topically daily. APPLIED TO WOUND    . QUEtiapine (SEROQUEL) 200 MG tablet Take 1 tablet (200 mg total) by mouth at bedtime. 60 tablet 5  . tamsulosin (FLOMAX) 0.4 MG CAPS capsule TAKE 2 CAPSULES BY MOUTH DAILY AFTER SUPPER (Patient taking differently: Take 0.8 mg by mouth daily with supper. ) 180 capsule 0   No current facility-administered medications for this  visit.     ROS:  See HPI  Physical Exam:  Vitals:   02/28/20 1126  BP: (!) 157/81  Pulse: 75  Resp: 18  Temp: 98 F (36.7 C)  TempSrc: Temporal  SpO2: 99%  Weight: 200 lb (90.7 kg)  Height: 6\' 2"  (1.88 m)   General: well appearing, in wheel chair, in no distress Lungs: non labored Incision:  Right fem-pop incisions intact proximally. There is a small .25 cm opening of the distal popliteal incision with small amount of serous drainage. No increased drainage on compression. Probed with Q-tip applicator to about 1 cm depth. Packed with 2 x2 Extremities: 2+ femoral pulse, right leg and foot warm. Motor and sensation intact. Mild edema of left leg and foot. 2+ left femoral pulse, left lower extremity warm. No swelling or edema of left leg. Brisk DP/PT, and peroneal doppler signals. Neuro: Alert and oriented  Assessment/Plan:  This is a 69 y.o. male who is s/p right femoral to below knee popliteal bypass with PTFE by Dr. Trula Slade 01/20/20. Continued adequate perfusion of right lower extremity. Brisk doppler PT/DP and peroneal. Does continue to have dorsal right foot wound that is superficial as well as a pinhole area in below knee incision that has been present since last visit. I probed this again with a Q-tip and is approximately 1 cm deep. I packed it with a piece of 2 x 2 gauze  - keep follow up with Dr. Posey Pronto  - will have [facility] do more frequent dressing changes 3x/week- packing change wet-to-dry - continue Aspirin, Statin and Plavix - discussed risks of smoking and wound healing as well as patient is borderline diabetic - Follow up in 2-3 weeks for incision check because of concerns with PTFE graft and risk of graft infection  Karoline Caldwell, PA-C [facility] [phone]  Clinic MD:  Dr. Carlis Abbott

## 2020-03-01 ENCOUNTER — Emergency Department (HOSPITAL_COMMUNITY): Payer: Medicare Other

## 2020-03-01 ENCOUNTER — Other Ambulatory Visit: Payer: Self-pay

## 2020-03-01 ENCOUNTER — Encounter (HOSPITAL_COMMUNITY): Payer: Self-pay | Admitting: Emergency Medicine

## 2020-03-01 ENCOUNTER — Telehealth: Payer: Self-pay | Admitting: Physician Assistant

## 2020-03-01 ENCOUNTER — Inpatient Hospital Stay (HOSPITAL_COMMUNITY)
Admission: EM | Admit: 2020-03-01 | Discharge: 2020-03-12 | DRG: 863 | Disposition: A | Discharge status: Home Health | Payer: Medicare Other | Attending: Surgery | Admitting: Surgery

## 2020-03-01 DIAGNOSIS — Z91040 Latex allergy status: Secondary | ICD-10-CM | POA: Diagnosis not present

## 2020-03-01 DIAGNOSIS — Z8249 Family history of ischemic heart disease and other diseases of the circulatory system: Secondary | ICD-10-CM

## 2020-03-01 DIAGNOSIS — I739 Peripheral vascular disease, unspecified: Secondary | ICD-10-CM | POA: Diagnosis not present

## 2020-03-01 DIAGNOSIS — F17210 Nicotine dependence, cigarettes, uncomplicated: Secondary | ICD-10-CM | POA: Diagnosis present

## 2020-03-01 DIAGNOSIS — Z888 Allergy status to other drugs, medicaments and biological substances status: Secondary | ICD-10-CM | POA: Diagnosis not present

## 2020-03-01 DIAGNOSIS — L02222 Furuncle of back [any part, except buttock]: Secondary | ICD-10-CM | POA: Diagnosis not present

## 2020-03-01 DIAGNOSIS — T8140XA Infection following a procedure, unspecified, initial encounter: Secondary | ICD-10-CM | POA: Diagnosis present

## 2020-03-01 DIAGNOSIS — I252 Old myocardial infarction: Secondary | ICD-10-CM

## 2020-03-01 DIAGNOSIS — Z8673 Personal history of transient ischemic attack (TIA), and cerebral infarction without residual deficits: Secondary | ICD-10-CM | POA: Diagnosis not present

## 2020-03-01 DIAGNOSIS — I10 Essential (primary) hypertension: Secondary | ICD-10-CM | POA: Diagnosis present

## 2020-03-01 DIAGNOSIS — L03115 Cellulitis of right lower limb: Secondary | ICD-10-CM | POA: Diagnosis present

## 2020-03-01 DIAGNOSIS — Y832 Surgical operation with anastomosis, bypass or graft as the cause of abnormal reaction of the patient, or of later complication, without mention of misadventure at the time of the procedure: Secondary | ICD-10-CM | POA: Diagnosis present

## 2020-03-01 DIAGNOSIS — Z20822 Contact with and (suspected) exposure to covid-19: Secondary | ICD-10-CM | POA: Diagnosis present

## 2020-03-01 DIAGNOSIS — B965 Pseudomonas (aeruginosa) (mallei) (pseudomallei) as the cause of diseases classified elsewhere: Secondary | ICD-10-CM | POA: Diagnosis present

## 2020-03-01 DIAGNOSIS — Z951 Presence of aortocoronary bypass graft: Secondary | ICD-10-CM

## 2020-03-01 DIAGNOSIS — T8141XA Infection following a procedure, superficial incisional surgical site, initial encounter: Secondary | ICD-10-CM | POA: Diagnosis not present

## 2020-03-01 DIAGNOSIS — I255 Ischemic cardiomyopathy: Secondary | ICD-10-CM | POA: Diagnosis present

## 2020-03-01 DIAGNOSIS — R451 Restlessness and agitation: Secondary | ICD-10-CM | POA: Diagnosis present

## 2020-03-01 DIAGNOSIS — I9789 Other postprocedural complications and disorders of the circulatory system, not elsewhere classified: Secondary | ICD-10-CM

## 2020-03-01 DIAGNOSIS — Z95820 Peripheral vascular angioplasty status with implants and grafts: Secondary | ICD-10-CM

## 2020-03-01 DIAGNOSIS — R509 Fever, unspecified: Secondary | ICD-10-CM

## 2020-03-01 DIAGNOSIS — I2510 Atherosclerotic heart disease of native coronary artery without angina pectoris: Secondary | ICD-10-CM | POA: Diagnosis present

## 2020-03-01 LAB — CBC WITH DIFFERENTIAL/PLATELET
Abs Immature Granulocytes: 0.07 10*3/uL (ref 0.00–0.07)
Basophils Absolute: 0.1 10*3/uL (ref 0.0–0.1)
Basophils Relative: 1 %
Eosinophils Absolute: 0.1 10*3/uL (ref 0.0–0.5)
Eosinophils Relative: 1 %
HCT: 39.2 % (ref 39.0–52.0)
Hemoglobin: 12.3 g/dL — ABNORMAL LOW (ref 13.0–17.0)
Immature Granulocytes: 1 %
Lymphocytes Relative: 10 %
Lymphs Abs: 0.9 10*3/uL (ref 0.7–4.0)
MCH: 27.3 pg (ref 26.0–34.0)
MCHC: 31.4 g/dL (ref 30.0–36.0)
MCV: 87.1 fL (ref 80.0–100.0)
Monocytes Absolute: 0.5 10*3/uL (ref 0.1–1.0)
Monocytes Relative: 6 %
Neutro Abs: 7.5 10*3/uL (ref 1.7–7.7)
Neutrophils Relative %: 81 %
Platelets: 275 10*3/uL (ref 150–400)
RBC: 4.5 MIL/uL (ref 4.22–5.81)
RDW: 13.5 % (ref 11.5–15.5)
WBC: 9.1 10*3/uL (ref 4.0–10.5)
nRBC: 0 % (ref 0.0–0.2)

## 2020-03-01 LAB — RESPIRATORY PANEL BY RT PCR (FLU A&B, COVID)
Influenza A by PCR: NEGATIVE
Influenza B by PCR: NEGATIVE
SARS Coronavirus 2 by RT PCR: NEGATIVE

## 2020-03-01 LAB — COMPREHENSIVE METABOLIC PANEL
ALT: 31 U/L (ref 0–44)
AST: 28 U/L (ref 15–41)
Albumin: 3.5 g/dL (ref 3.5–5.0)
Alkaline Phosphatase: 87 U/L (ref 38–126)
Anion gap: 11 (ref 5–15)
BUN: 15 mg/dL (ref 8–23)
CO2: 22 mmol/L (ref 22–32)
Calcium: 9.5 mg/dL (ref 8.9–10.3)
Chloride: 106 mmol/L (ref 98–111)
Creatinine, Ser: 1.14 mg/dL (ref 0.61–1.24)
GFR calc Af Amer: >60 mL/min (ref >60)
GFR calc non Af Amer: >60 mL/min (ref >60)
Glucose, Bld: 115 mg/dL — ABNORMAL HIGH (ref 70–99)
Potassium: 4.1 mmol/L (ref 3.5–5.1)
Sodium: 139 mmol/L (ref 135–145)
Total Bilirubin: 0.7 mg/dL (ref 0.3–1.2)
Total Protein: 6.5 g/dL (ref 6.5–8.1)

## 2020-03-01 LAB — SEDIMENTATION RATE: Sed Rate: 23 mm/hr — ABNORMAL HIGH (ref 0–16)

## 2020-03-01 LAB — PROTIME-INR
INR: 1 (ref 0.8–1.2)
Prothrombin Time: 13.2 seconds (ref 11.4–15.2)

## 2020-03-01 LAB — LACTIC ACID, PLASMA: Lactic Acid, Venous: 2.8 mmol/L (ref 0.5–1.9)

## 2020-03-01 LAB — C-REACTIVE PROTEIN: CRP: 11.1 mg/dL — ABNORMAL HIGH (ref <1.0)

## 2020-03-01 LAB — APTT: aPTT: 36 seconds (ref 24–36)

## 2020-03-01 IMAGING — DX DG CHEST 1V PORT
1 series · 1 of 1 positions shown · non-contrast
Comparison: Chest radiographs [DATE] and earlier.

CLINICAL DATA: 68-year-old male with shortness of breath and fever.

EXAM:
PORTABLE CHEST 1 VIEW

[chest ap]
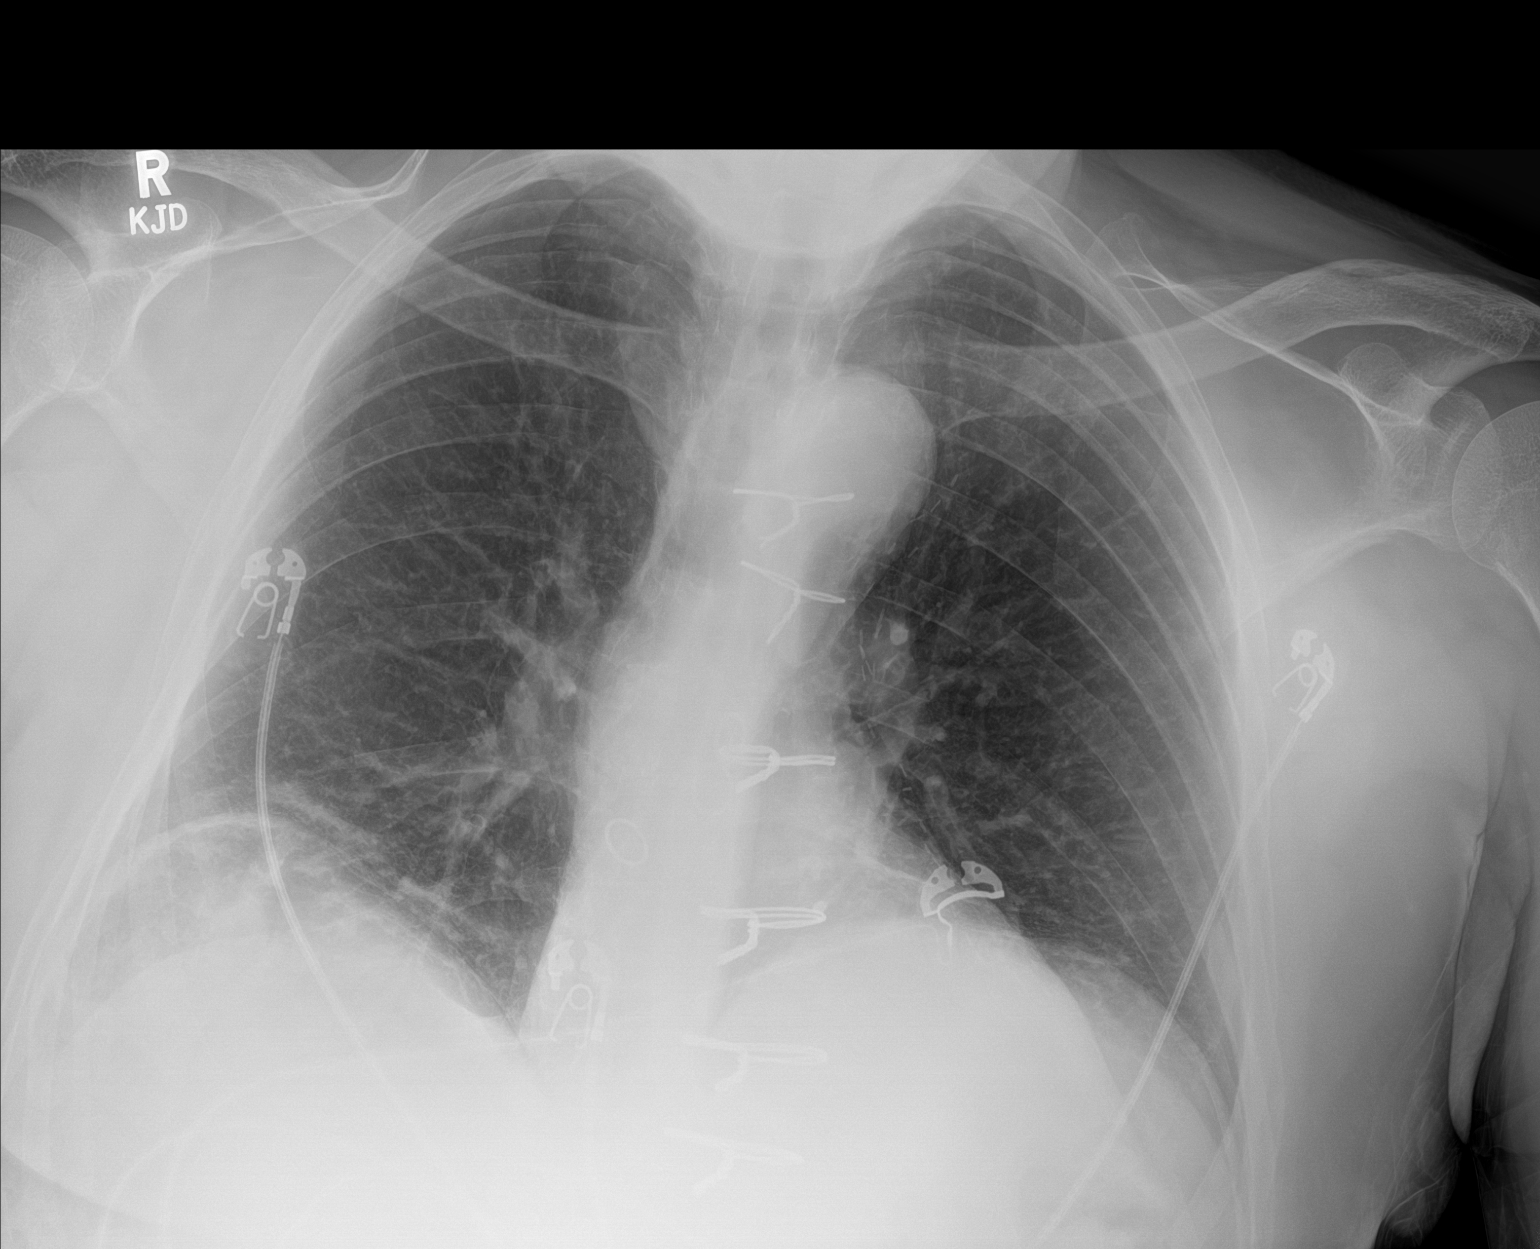

[1 of 1 positions shown; findings below may reference images not displayed]

FINDINGS: Portable AP upright view at [LC] hours. Lower lung volumes. Stable
cardiac size and mediastinal contours. Prior CABG mild lung base
atelectasis suspected, but otherwise Allowing for portable technique
the lungs are clear. No pneumothorax. No acute osseous abnormality
identified.
IMPRESSION: Low lung volumes, otherwise no acute cardiopulmonary abnormality.

## 2020-03-01 MED ORDER — SODIUM CHLORIDE 0.9 % IV BOLUS (SEPSIS): 1000.0000 mL | Freq: Once | INTRAVENOUS | Status: DC

## 2020-03-01 MED ORDER — VANCOMYCIN HCL 1500 MG/300ML IV SOLN
1500.0000 mg | Freq: Once | INTRAVENOUS | Status: AC
  Administered 2020-03-01: 1500 mg via INTRAVENOUS
  Filled 2020-03-01: qty 300

## 2020-03-01 MED ORDER — LORAZEPAM 2 MG/ML IJ SOLN
INTRAMUSCULAR | Status: AC
  Administered 2020-03-01: 2 mg
  Filled 2020-03-01: qty 1

## 2020-03-01 MED ORDER — NICOTINE 14 MG/24HR TD PT24
14.0000 mg | MEDICATED_PATCH | Freq: Once | TRANSDERMAL | Status: DC
  Filled 2020-03-01: qty 1

## 2020-03-01 MED ORDER — SODIUM CHLORIDE 0.9 % IV BOLUS
500.0000 mL | Freq: Once | INTRAVENOUS | Status: AC
  Administered 2020-03-01: 500 mL via INTRAVENOUS

## 2020-03-01 MED ORDER — PIPERACILLIN-TAZOBACTAM 3.375 G IVPB
3.3750 g | Freq: Three times a day (TID) | INTRAVENOUS | Status: DC
  Administered 2020-03-02: 3.375 g via INTRAVENOUS
  Filled 2020-03-01: qty 50

## 2020-03-01 MED ORDER — PIPERACILLIN-TAZOBACTAM 3.375 G IVPB 30 MIN
3.3750 g | Freq: Once | INTRAVENOUS | Status: AC
  Administered 2020-03-01: 3.375 g via INTRAVENOUS
  Filled 2020-03-01: qty 50

## 2020-03-01 MED ORDER — CEPHALEXIN 500 MG PO CAPS: 500.0000 mg | ORAL_CAPSULE | Freq: Three times a day (TID) | ORAL | 0 refills | Status: DC

## 2020-03-01 MED ORDER — VANCOMYCIN HCL IN DEXTROSE 1-5 GM/200ML-% IV SOLN: 1000.0000 mg | Freq: Once | INTRAVENOUS | Status: DC

## 2020-03-01 MED ORDER — VANCOMYCIN HCL IN DEXTROSE 1-5 GM/200ML-% IV SOLN
1000.0000 mg | Freq: Two times a day (BID) | INTRAVENOUS | Status: DC
  Administered 2020-03-02 – 2020-03-03 (×4): 1000 mg via INTRAVENOUS
  Filled 2020-03-01 (×7): qty 200

## 2020-03-01 NOTE — Telephone Encounter (Signed)
Pt is s/p right femoral to popliteal bypass grafting on 01/20/2020 by Dr. Myra Gianotti. Pt was seen on 02/28/2020 and had c/o serous drainage that was described as yellowish/green with surrounding redness but was improved at the visit 2 days ago.  He was not having any fever or chills at that time.  PA discussed with pt risks of continuing to smoke and wound healing as he is borderline diabetic.  [name] called today with concerns of pt having redness around the incisions, swollen right leg and subjective fever with nausea but no vomiting.  She states there is clear drainage from the incision and redness around the incision.  She states this was not present on Tuesday when he was seen.  He does not have any shortness of breath or chest pain.  She states that [facility] will start tomorrow for dressing changes.   She does not have a thermometer to take his temp but has ordered one.  Her daughter is bringing one.   I have asked the wife to bring the pt in today or tomorrow and she tells me that she absolutely cannot bring the pt in.  She states that she is having a stress test tomorrow afternoon and is unable to bring him in.  I discussed with her that he has synthetic material for his bypass and that if this gets infected, he can become very sick.  She states she can do a video visit tomorrow morning.  I have sent in an rx for Keflex 500mg  tid for 7 day course.  I told the wife that when she gets the thermometer and if he has a fever greater than 101, he needs to go to the emergency room.  I have had the office schedule a video visit for in the morning as the wife says this is the best time.  , [facility] 03/01/2020 2:15 PM

## 2020-03-01 NOTE — Progress Notes (Signed)
Pharmacy Antibiotic Note  Lonnie [name] is a 69 y.o. male admitted on 03/01/2020 with Surgival fem bypass infection .  Pharmacy has been consulted for Vancomycin dosing.  Height: 6\' 2"  (188 cm) Weight: 175 lb (79.4 kg) IBW/kg (Calculated) : 82.2  Temp (24hrs), Avg:100.8 F (38.2 C), Min:100.8 F (38.2 C), Max:100.8 F (38.2 C)  Recent Labs  Lab 03/01/20 2132  WBC 9.1  CREATININE 1.14  LATICACIDVEN 2.8*    Estimated Creatinine Clearance: 71.6 mL/min (by C-G formula based on SCr of 1.14 mg/dL).    Allergies  Allergen Reactions  . Ace Inhibitors Swelling    Angioedema  . Dairy Aid [Lactase]     gas  . Eggs Or Egg-Derived Products     Cannot eat Prepared Eggs  . Latex Itching    Antimicrobials this admission: 3/11 Zosyn >>  3/11 Vancomycin >>   Dose adjustments this admission:   Microbiology results: Pending   Plan:  - Vancomycin 1500mg  IV x 1 dose  - Followed by Vancomycin 1000mg  IV q12hr - Est Calc AUC  530 - Monitor patients renal function and urine output  - Antibiotic de-escalation when appropriate   Thank you for allowing pharmacy to be a part of this patient's care.  5/11 PharmD. BCPS  03/01/2020 10:35 PM

## 2020-03-01 NOTE — ED Notes (Signed)
Updated pt's daughter, [name], on plan of care.

## 2020-03-01 NOTE — ED Triage Notes (Addendum)
Pt BIB [facility] from home where he lives with his daughter, pt reportedly had a right femoral to popliteal artery bypass graft x 2 weeks ago. Family reports increased confusion, fever, and his right lower leg is swollen and warm to touch. Given 2.5mg  midazolam by EMS for agitation, pt calm and cooperative at this time. EMS VS: BP 158/66, HR 80, Temp 102.4-given 1g tylenol by EMS PTA. Pt started on oral abx today.

## 2020-03-01 NOTE — ED Provider Notes (Signed)
[facility] EMERGENCY DEPARTMENT Provider Note   CSN: [id number] Arrival date & time: 03/01/20  2107     History Chief Complaint  Patient presents with  . Wound Infection    Lonnie [name] is a 69 y.o. male.  Presents to ER with chief complaint of fever.  History of peripheral vascular disease s/p femoral to popliteal bypass with graft in January.  Has noted some swelling, redness to his lower leg, had some clear drainage from his incision site, now concern for some purulent drainage.  Patient states that besides his leg he does not have any other complaints.  Wife reported patient felt warm at home, EMS reported fever to 102.  Patient had been given cephalexin by outpatient vascular.  HPI     Past Medical History:  Diagnosis Date  . Allergy   . Anxiety   . Arthritis    left neck, shoulder, knee  . CAD (coronary artery disease)    a. anterior STEMI 10/2013 s/p 4V CABG with LIMA to mid LAD, SVG to OM, SVG to PDA, SVG to Diagonal.  . Dementia ([place])   . Depression   . Falls   . Hypertension   . Ischemic cardiomyopathy    a. EF 40-45% at time of CABG and in 2018.  . MI (myocardial infarction) ([place])   . Pre-diabetes   . Prediabetes 09/01/2014  . PVD (peripheral vascular disease) ([place])    a. s/p L SFA stents with now known bilateral SFA occlusion treated medically.  . Stroke [facility])    seen on CT Scan  . Tobacco abuse     Patient Active Problem List   Diagnosis Date Noted  . PAD (peripheral artery disease) ([place]) 01/20/2020  . Fronto-temporal dementia ([place]) 08/04/2019  . Left shoulder pain   . TBI (traumatic brain injury) ([place])   . Urinary retention   . Postoperative pain   . Agitation   . SDH (subdural hematoma) ([place]) 07/08/2019  . Subdural hematoma ([place]) 06/30/2019  . Erectile dysfunction 07/28/2018  . Peripheral vascular disease ([place]) 07/16/2017  . Anxiety and depression 07/16/2017  . PCP NOTES >>>>>>>>>>>> 07/08/2016  . Dermatitis 04/19/2015    . Elevated LFTs 02/12/2015  . Hyperglycemia 09/01/2014  . DJD (degenerative joint disease) 09/01/2014  . Annual physical exam 09/01/2014  . S/P CABG x 4 11/07/2013  . HTN (hypertension) 10/29/2013  . Tobacco abuse 10/29/2013  . Coronary atherosclerosis of native coronary artery 10/29/2013    Past Surgical History:  Procedure Laterality Date  . ABDOMINAL AORTAGRAM N/A 06/07/2014   Procedure: ABDOMINAL Maxcine Ham;  Surgeon: Wellington Hampshire, MD;  Location: [place] CATH LAB;  Service: Cardiovascular;  Laterality: N/A;  . ABDOMINAL AORTAGRAM N/A 03/07/2015   Procedure: ABDOMINAL Maxcine Ham;  Surgeon: Wellington Hampshire, MD;  Location: [place] CATH LAB;  Service: Cardiovascular;  Laterality: N/A;  . ABDOMINAL AORTOGRAM W/LOWER EXTREMITY N/A 01/18/2020   Procedure: ABDOMINAL AORTOGRAM W/LOWER EXTREMITY - Right;  Surgeon: Wellington Hampshire, MD;  Location: [place] CV LAB;  Service: Cardiovascular;  Laterality: N/A;  . APPENDECTOMY    . COLONOSCOPY WITH PROPOFOL N/A 10/11/2015   Procedure: COLONOSCOPY WITH PROPOFOL;  Surgeon: Juanita Craver, MD;  Location: [facility] ENDOSCOPY;  Service: Endoscopy;  Laterality: N/A;  . CORONARY ARTERY BYPASS GRAFT N/A 11/04/2013   Procedure: CORONARY ARTERY BYPASS GRAFTING (CABG) TIMES FOUR  USING LEFT INTERNAL MAMMARY ARTERY AND RIGHT AND LEFT SAPHENOUS LEG VEIN HARVESTED ENDOSCOPICALLY;  Surgeon: Melrose Nakayama, MD;  Location: [place];  Service: Open Heart Surgery;  Laterality: N/A;  . CRANIOTOMY Left 06/30/2019   Procedure: Frontal CRANIOTOMY HEMATOMA EVACUATION SUBDURAL;  Surgeon: Lisbeth RenshawNundkumar, Neelesh, MD;  Location: [facility] OR;  Service: Neurosurgery;  Laterality: Left;  Frontal CRANIOTOMY HEMATOMA EVACUATION SUBDURAL  . FEMORAL-POPLITEAL BYPASS GRAFT Right 01/20/2020   Procedure: BYPASS GRAFT FEMORAL-POPLITEAL ARTERY RIGHT LEG USING GORE PROPATEN GRAFT;  Surgeon: Nada LibmanBrabham, Vance W, MD;  Location: [facility] OR;  Service: Vascular;  Laterality: Right;  . FINGER SURGERY  2017   injury  . KNEE  SURGERY     fractured patella  . LEFT HEART CATH N/A 10/29/2013   Procedure: LEFT HEART CATH;  Surgeon: Kathleene Hazelhristopher D McAlhany, MD;  [facility];  Service: Cardiovascular;  Laterality: N/A;  . LEFT HEART CATHETERIZATION WITH CORONARY ANGIOGRAM N/A 10/31/2013   Procedure: LEFT HEART CATHETERIZATION WITH CORONARY ANGIOGRAM;  Surgeon: Kathleene Hazelhristopher D McAlhany, MD;  [facility];  Service: Cardiovascular;  Laterality: N/A;  . MOUTH SURGERY    . TOE SURGERY         Family History  Problem Relation Age of Onset  . CAD Father 1869  . AAA (abdominal aortic aneurysm) Father   . Alcohol abuse Father   . Hypertension Father   . Diabetes Father   . Heart attack Father   . Stroke Mother   . Arthritis Mother   . Heart disease Mother   . Hypertension Mother   . Heart attack Mother   . Cardiomyopathy Daughter   . Colon cancer Neg Hx   . Prostate cancer Neg Hx     Social History   Tobacco Use  . Smoking status: Current Every Day Smoker    Packs/day: 0.25    Years: 30.00    Pack years: 7.50    Types: Cigarettes  . Smokeless tobacco: Never Used  . Tobacco comment: < 1/2 ppd  Substance Use Topics  . Alcohol use: No    Alcohol/week: 0.0 standard drinks  . Drug use: No    Home Medications Prior to Admission medications   Medication Sig Start Date End Date Taking? Authorizing Provider  acetaminophen (TYLENOL) 325 MG tablet Take 2 tablets (650 mg total) by mouth 4 (four) times daily -  before meals and at bedtime. Patient taking differently: Take 650 mg by mouth 4 (four) times daily as needed (pain.).  07/22/19  Yes Love, Evlyn Kanneramela S, PA-C  aspirin EC 81 MG tablet Take 81 mg by mouth daily.   Yes [provider]  atorvastatin (LIPITOR) 80 MG tablet TAKE 1 TABLET BY MOUTH  DAILY AT 6 PM Patient taking differently: Take 80 mg by mouth daily at 6 PM. TAKE 1 TABLET BY MOUTH  DAILY AT 6 PM 12/30/19  Yes Paz, Nolon RodJose E, MD  Calcium Carb-Cholecalciferol (CALCIUM 600+D) 600-800  MG-UNIT TABS Take 1 tablet by mouth daily.   Yes [provider]  cephALEXin (KEFLEX) 500 MG capsule Take 1 capsule (500 mg total) by mouth 3 (three) times daily. 03/01/20  Yes Rhyne, Ames CoupeSamantha J, PA-C  clonazePAM (KLONOPIN) 0.25 MG disintegrating tablet Take 1 tablet (0.25 mg total) by mouth 2 (two) times daily as needed. 01/19/20  Yes Paz, Nolon RodJose E, MD  clopidogrel (PLAVIX) 75 MG tablet Take 1 tablet (75 mg total) by mouth daily. 01/23/20  Yes Setzer, Lynnell JudeSandra J, PA-C  fluticasone (FLONASE) 50 MCG/ACT nasal spray Place 2 sprays into both nostrils daily. Patient taking differently: Place 2 sprays into both nostrils daily as needed for allergies.  07/23/19  Yes Love, Evlyn Kanner, PA-C  gabapentin (NEURONTIN) 100 MG capsule Take 1 capsule (100 mg total) by mouth 2 (two) times daily. 01/16/20  Yes Paz, Nolon Rod, MD  irbesartan (AVAPRO) 300 MG tablet Take 1 tablet (300 mg total) by mouth daily. 02/17/20  Yes Iran Ouch, MD  loratadine (CLARITIN) 10 MG tablet Take 1 tablet (10 mg total) by mouth daily. 07/23/19  Yes Love, Evlyn Kanner, PA-C  lovastatin (MEVACOR) 40 MG tablet Take 80 mg by mouth at bedtime.   Yes [provider]  Melatonin 3 MG TABS Take 1 tablet by mouth at bedtime.   Yes [provider]  metoprolol tartrate (LOPRESSOR) 25 MG tablet Take 1 tablet (25 mg total) by mouth 2 (two) times daily. 10/17/19  Yes Paz, Nolon Rod, MD  pantoprazole (PROTONIX) 40 MG tablet TAKE 1 TABLET BY MOUTH EVERYDAY AT BEDTIME Patient taking differently: Take 40 mg by mouth at bedtime.  12/19/19  Yes Paz, Nolon Rod, MD  QUEtiapine (SEROQUEL) 200 MG tablet Take 1 tablet (200 mg total) by mouth at bedtime. 01/23/20  Yes Paz, Nolon Rod, MD  tamsulosin (FLOMAX) 0.4 MG CAPS capsule TAKE 2 CAPSULES BY MOUTH DAILY AFTER SUPPER Patient taking differently: Take 0.8 mg by mouth daily with supper.  12/07/19  Yes Paz, Nolon Rod, MD  collagenase (SANTYL) ointment Apply 1 application topically daily. Measurements right foot ulcer  2. 0 x 1.0 x 0.3cm Patient not taking: Reported on 03/01/2020 02/28/20   Candelaria Stagers, DPM    Allergies    Ace inhibitors, Dairy aid [lactase], Eggs or egg-derived products, and Latex  Review of Systems   Review of Systems  Constitutional: Positive for fever. Negative for chills.  HENT: Negative for ear pain and sore throat.   Eyes: Negative for pain and visual disturbance.  Respiratory: Negative for cough and shortness of breath.   Cardiovascular: Negative for chest pain and palpitations.  Gastrointestinal: Negative for abdominal pain and vomiting.  Genitourinary: Negative for dysuria and hematuria.  Musculoskeletal: Positive for arthralgias. Negative for back pain.  Skin: Negative for color change and rash.  Neurological: Negative for seizures and syncope.  All other systems reviewed and are negative.   Physical Exam Updated Vital Signs BP 122/76   Pulse 73   Temp (!) 100.8 F (38.2 C) (Oral)   Resp 20   Ht 6\' 2"  (1.88 m)   Wt 79.4 kg   SpO2 98%   BMI 22.47 kg/m   Physical Exam Vitals and nursing note reviewed.  Constitutional:      Appearance: He is well-developed.  HENT:     Head: Normocephalic and atraumatic.  Eyes:     Conjunctiva/sclera: Conjunctivae normal.  Cardiovascular:     Rate and Rhythm: Normal rate and regular rhythm.     Heart sounds: No murmur.  Pulmonary:     Effort: Pulmonary effort is normal. No respiratory distress.     Breath sounds: Normal breath sounds.  Abdominal:     Palpations: Abdomen is soft.     Tenderness: There is no abdominal tenderness.  Musculoskeletal:     Cervical back: Neck supple.     Comments: Right lower extremity: generalized erythema over lower leg, mild to moderate nonpitting edema over lower leg.  Warm to touch.  Small ooze from right lower leg incision site.  Strong Doppler pulse and PT and DP  Skin:    General: Skin is warm and dry.     Capillary Refill: Capillary refill takes  less than 2 seconds.     Findings:  Erythema present.  Neurological:     General: No focal deficit present.     Mental Status: He is alert and oriented to person, place, and time.  Psychiatric:        Mood and Affect: Mood normal.        Behavior: Behavior normal.     ED Results / Procedures / Treatments   Labs (all labs ordered are listed, but only abnormal results are displayed) Labs Reviewed  LACTIC ACID, PLASMA - Abnormal; Notable for the following components:      Result Value   Lactic Acid, Venous 2.8 (*)    All other components within normal limits  COMPREHENSIVE METABOLIC PANEL - Abnormal; Notable for the following components:   Glucose, Bld 115 (*)    All other components within normal limits  CBC WITH DIFFERENTIAL/PLATELET - Abnormal; Notable for the following components:   Hemoglobin 12.3 (*)    All other components within normal limits  C-REACTIVE PROTEIN - Abnormal; Notable for the following components:   CRP 11.1 (*)    All other components within normal limits  SEDIMENTATION RATE - Abnormal; Notable for the following components:   Sed Rate 23 (*)    All other components within normal limits  RESPIRATORY PANEL BY RT PCR (FLU A&B, COVID)  CULTURE, BLOOD (ROUTINE X 2)  CULTURE, BLOOD (ROUTINE X 2)  URINE CULTURE  PROTIME-INR  APTT  LACTIC ACID, PLASMA  URINALYSIS, ROUTINE W REFLEX MICROSCOPIC    EKG None  Radiology DG Chest Port 1 View  Result Date: 03/01/2020 CLINICAL DATA:  69 year old male with shortness of breath and fever. EXAM: PORTABLE CHEST 1 VIEW COMPARISON:  Chest radiographs 11/29/2013 and earlier. FINDINGS: Portable AP upright view at 2136 hours. Lower lung volumes. Stable cardiac size and mediastinal contours. Prior CABG mild lung base atelectasis suspected, but otherwise Allowing for portable technique the lungs are clear. No pneumothorax. No acute osseous abnormality identified. IMPRESSION: Low lung volumes, otherwise no acute cardiopulmonary abnormality. Electronically Signed    By: Odessa Fleming M.D.   On: 03/01/2020 21:48    Procedures Procedures (including critical care time)  Medications Ordered in ED Medications  vancomycin (VANCOREADY) IVPB 1500 mg/300 mL (1,500 mg Intravenous New Bag/Given 03/01/20 2220)  nicotine (NICODERM CQ - dosed in mg/24 hours) patch 14 mg (0 mg Transdermal Hold 03/01/20 2207)  vancomycin (VANCOCIN) IVPB 1000 mg/200 mL premix (has no administration in time range)  piperacillin-tazobactam (ZOSYN) IVPB 3.375 g (has no administration in time range)  piperacillin-tazobactam (ZOSYN) IVPB 3.375 g (0 g Intravenous Stopped 03/01/20 2220)  sodium chloride 0.9 % bolus 500 mL (0 mLs Intravenous Stopped 03/01/20 2243)  LORazepam (ATIVAN) 2 MG/ML injection (2 mg  Given 03/01/20 2215)    ED Course  I have reviewed the triage vital signs and the nursing notes.  Pertinent labs & imaging results that were available during my care of the patient were reviewed by me and considered in my medical decision making (see chart for details).  Clinical Course as of Mar 01 2338  Thu Mar 01, 2020  2221 D/w Chestine Spore who will come evaluate patient   [RD]    Clinical Course User Index [RD] Milagros Loll, MD   MDM Rules/Calculators/A&P                      69 year old male presenting to ER with complaints of fever.  Recent femoropopliteal bypass graft surgery.  Concern for cellulitis versus graft infection versus other postoperative complication.  Labs notable for elevated lactate but no leukocytosis.  Provided broad-spectrum antibiotics with vancomycin and Zosyn.  Provided some fluids though patient has history of heart failure and is currently hemodynamically stable, hesitant to fluid overload.  Will hold on further fluids for now.  Consulted vascular surgery who came to bedside, they will admit for further observation.  Request CT of his lower extremity to further evaluate.  Wife also reported fall today and possible head trauma, though I do not appreciate any on  exam, will check CT head as well.    Dr. Chestine Spore to admit.    Final Clinical Impression(s) / ED Diagnoses Final diagnoses:  Cellulitis of right lower extremity  Fever, unspecified fever cause  Postoperative surgical complication involving circulatory system associated with circulatory procedure, unspecified complication    Rx / DC Orders ED Discharge Orders    None       Milagros Loll, MD 03/01/20 2339

## 2020-03-01 NOTE — Consult Note (Signed)
Hospital Consult    Reason for Consult:  Right leg cellulitis, concern for infected bypass, fever, increasing confusion Referring Physician:  ED MRN #:  [id number]  History of Present Illness: This is a 69 y.o. male with history of coronary artery disease status post CABG, hypertension, cardiomyopathy, dementia, peripheral vascular disease status post recent right leg bypass that presents to the ED with fever of 102, increased redness to the right leg, and concern for infected bypass.  Patient recently had a right femoral to below-knee popliteal artery bypass with 6 mm PTFE on 01/20/2020 with Dr. Trula Slade for right foot ulcer.  Patient was seen in clinic on Tuesday by the PA with a small wound in the below-knee popliteal incision and he was instructed to pack this with home health.  Apparently called triage today and was put on Keflex over the phone and told to go to ed if he got a fever.    Past Medical History:  Diagnosis Date  . Allergy   . Anxiety   . Arthritis    left neck, shoulder, knee  . CAD (coronary artery disease)    a. anterior STEMI 10/2013 s/p 4V CABG with LIMA to mid LAD, SVG to OM, SVG to PDA, SVG to Diagonal.  . Dementia ([place])   . Depression   . Falls   . Hypertension   . Ischemic cardiomyopathy    a. EF 40-45% at time of CABG and in 2018.  . MI (myocardial infarction) ([place])   . Pre-diabetes   . Prediabetes 09/01/2014  . PVD (peripheral vascular disease) ([place])    a. s/p L SFA stents with now known bilateral SFA occlusion treated medically.  . Stroke [facility])    seen on CT Scan  . Tobacco abuse     Past Surgical History:  Procedure Laterality Date  . ABDOMINAL AORTAGRAM N/A 06/07/2014   Procedure: ABDOMINAL Maxcine Ham;  Surgeon: Wellington Hampshire, MD;  Location: [place] CATH LAB;  Service: Cardiovascular;  Laterality: N/A;  . ABDOMINAL AORTAGRAM N/A 03/07/2015   Procedure: ABDOMINAL Maxcine Ham;  Surgeon: Wellington Hampshire, MD;  Location: [place] CATH LAB;  Service: Cardiovascular;   Laterality: N/A;  . ABDOMINAL AORTOGRAM W/LOWER EXTREMITY N/A 01/18/2020   Procedure: ABDOMINAL AORTOGRAM W/LOWER EXTREMITY - Right;  Surgeon: Wellington Hampshire, MD;  Location: [place] CV LAB;  Service: Cardiovascular;  Laterality: N/A;  . APPENDECTOMY    . COLONOSCOPY WITH PROPOFOL N/A 10/11/2015   Procedure: COLONOSCOPY WITH PROPOFOL;  Surgeon: Juanita Craver, MD;  Location: [facility] ENDOSCOPY;  Service: Endoscopy;  Laterality: N/A;  . CORONARY ARTERY BYPASS GRAFT N/A 11/04/2013   Procedure: CORONARY ARTERY BYPASS GRAFTING (CABG) TIMES FOUR  USING LEFT INTERNAL MAMMARY ARTERY AND RIGHT AND LEFT SAPHENOUS LEG VEIN HARVESTED ENDOSCOPICALLY;  Surgeon: Melrose Nakayama, MD;  Location: [place];  Service: Open Heart Surgery;  Laterality: N/A;  . CRANIOTOMY Left 06/30/2019   Procedure: Frontal CRANIOTOMY HEMATOMA EVACUATION SUBDURAL;  Surgeon: Consuella Lose, MD;  Location: [place];  Service: Neurosurgery;  Laterality: Left;  Frontal CRANIOTOMY HEMATOMA EVACUATION SUBDURAL  . FEMORAL-POPLITEAL BYPASS GRAFT Right 01/20/2020   Procedure: BYPASS GRAFT FEMORAL-POPLITEAL ARTERY RIGHT LEG USING GORE PROPATEN GRAFT;  Surgeon: Serafina Mitchell, MD;  Location: [place];  Service: Vascular;  Laterality: Right;  . FINGER SURGERY  2017   injury  . KNEE SURGERY     fractured patella  . LEFT HEART CATH N/A 10/29/2013   Procedure: LEFT HEART CATH;  Surgeon: Burnell Blanks, MD;  Location: [facility]  CATH LAB;  Service: Cardiovascular;  Laterality: N/A;  . LEFT HEART CATHETERIZATION WITH CORONARY ANGIOGRAM N/A 10/31/2013   Procedure: LEFT HEART CATHETERIZATION WITH CORONARY ANGIOGRAM;  Surgeon: Kathleene Hazel, MD;  Location: [facility] CATH LAB;  Service: Cardiovascular;  Laterality: N/A;  . MOUTH SURGERY    . TOE SURGERY      Allergies  Allergen Reactions  . Ace Inhibitors Swelling    Angioedema  . Dairy Aid [Lactase]     gas  . Eggs Or Egg-Derived Products     Cannot eat Prepared Eggs  . Latex Itching    Prior  to Admission medications   Medication Sig Start Date End Date Taking? Authorizing Provider  acetaminophen (TYLENOL) 325 MG tablet Take 2 tablets (650 mg total) by mouth 4 (four) times daily -  before meals and at bedtime. Patient taking differently: Take 650 mg by mouth 4 (four) times daily as needed (pain.).  07/22/19  Yes Love, Evlyn Kanner, PA-C  aspirin EC 81 MG tablet Take 81 mg by mouth daily.   Yes [provider]  atorvastatin (LIPITOR) 80 MG tablet TAKE 1 TABLET BY MOUTH  DAILY AT 6 PM Patient taking differently: Take 80 mg by mouth daily at 6 PM. TAKE 1 TABLET BY MOUTH  DAILY AT 6 PM 12/30/19  Yes Paz, Nolon Rod, MD  Calcium Carb-Cholecalciferol (CALCIUM 600+D) 600-800 MG-UNIT TABS Take 1 tablet by mouth daily.   Yes [provider]  cephALEXin (KEFLEX) 500 MG capsule Take 1 capsule (500 mg total) by mouth 3 (three) times daily. 03/01/20  Yes Rhyne, Ames Coupe, PA-C  clonazePAM (KLONOPIN) 0.25 MG disintegrating tablet Take 1 tablet (0.25 mg total) by mouth 2 (two) times daily as needed. 01/19/20  Yes Paz, Nolon Rod, MD  clopidogrel (PLAVIX) 75 MG tablet Take 1 tablet (75 mg total) by mouth daily. 01/23/20  Yes Setzer, Lynnell Jude, PA-C  fluticasone (FLONASE) 50 MCG/ACT nasal spray Place 2 sprays into both nostrils daily. Patient taking differently: Place 2 sprays into both nostrils daily as needed for allergies.  07/23/19  Yes Love, Evlyn Kanner, PA-C  gabapentin (NEURONTIN) 100 MG capsule Take 1 capsule (100 mg total) by mouth 2 (two) times daily. 01/16/20  Yes Paz, Nolon Rod, MD  irbesartan (AVAPRO) 300 MG tablet Take 1 tablet (300 mg total) by mouth daily. 02/17/20  Yes Iran Ouch, MD  loratadine (CLARITIN) 10 MG tablet Take 1 tablet (10 mg total) by mouth daily. 07/23/19  Yes Love, Evlyn Kanner, PA-C  lovastatin (MEVACOR) 40 MG tablet Take 80 mg by mouth at bedtime.   Yes [provider]  Melatonin 3 MG TABS Take 1 tablet by mouth at bedtime.   Yes [provider]  metoprolol  tartrate (LOPRESSOR) 25 MG tablet Take 1 tablet (25 mg total) by mouth 2 (two) times daily. 10/17/19  Yes Paz, Nolon Rod, MD  pantoprazole (PROTONIX) 40 MG tablet TAKE 1 TABLET BY MOUTH EVERYDAY AT BEDTIME Patient taking differently: Take 40 mg by mouth at bedtime.  12/19/19  Yes Paz, Nolon Rod, MD  QUEtiapine (SEROQUEL) 200 MG tablet Take 1 tablet (200 mg total) by mouth at bedtime. 01/23/20  Yes Paz, Nolon Rod, MD  tamsulosin (FLOMAX) 0.4 MG CAPS capsule TAKE 2 CAPSULES BY MOUTH DAILY AFTER SUPPER Patient taking differently: Take 0.8 mg by mouth daily with supper.  12/07/19  Yes Paz, Nolon Rod, MD  collagenase (SANTYL) ointment Apply 1 application topically daily. Measurements right foot ulcer 2. 0 x 1.0  x 0.3cm Patient not taking: Reported on 03/01/2020 02/28/20   Candelaria Stagers, DPM    Social History   Socioeconomic History  . Marital status: Married    Spouse name: [name]  . Number of children: 1  . Years of education: Not on file  . Highest education level: Not on file  Occupational History  . Occupation: long term disability --Event organiser: [employer]  Tobacco Use  . Smoking status: Current Every Day Smoker    Packs/day: 0.25    Years: 30.00    Pack years: 7.50    Types: Cigarettes  . Smokeless tobacco: Never Used  . Tobacco comment: < 1/2 ppd  Substance and Sexual Activity  . Alcohol use: No    Alcohol/week: 0.0 standard drinks  . Drug use: No  . Sexual activity: Yes  Other Topics Concern  . Not on file  Social History Narrative   Household-- pt and wife   1 daughter    Social Determinants of Health   Financial Resource Strain:   . Difficulty of Paying Living Expenses:   Food Insecurity:   . Worried About Programme researcher, broadcasting/film/video in the Last Year:   . Barista in the Last Year:   Transportation Needs:   . Freight forwarder (Medical):   Marland Kitchen Lack of Transportation (Non-Medical):   Physical Activity:   . Days of Exercise per Week:   . Minutes of Exercise per  Session:   Stress:   . Feeling of Stress :   Social Connections:   . Frequency of Communication with Friends and Family:   . Frequency of Social Gatherings with Friends and Family:   . Attends Religious Services:   . Active Member of Clubs or Organizations:   . Attends Banker Meetings:   Marland Kitchen Marital Status:   Intimate Partner Violence:   . Fear of Current or Ex-Partner:   . Emotionally Abused:   Marland Kitchen Physically Abused:   . Sexually Abused:     Family History  Problem Relation Age of Onset  . CAD Father 75  . AAA (abdominal aortic aneurysm) Father   . Alcohol abuse Father   . Hypertension Father   . Diabetes Father   . Heart attack Father   . Stroke Mother   . Arthritis Mother   . Heart disease Mother   . Hypertension Mother   . Heart attack Mother   . Cardiomyopathy Daughter   . Colon cancer Neg Hx   . Prostate cancer Neg Hx     ROS: [x]  Positive   [ ]  Negative   [ ]  All sytems reviewed and are negative  Cardiovascular: []  chest pain/pressure []  palpitations []  SOB lying flat []  DOE []  pain in legs while walking []  pain in legs at rest []  pain in legs at night []  non-healing ulcers []  hx of DVT [x]  swelling in legs (right)  Pulmonary: []  productive cough []  asthma/wheezing []  home O2  Neurologic: []  weakness in []  arms []  legs []  numbness in []  arms []  legs []  hx of CVA []  mini stroke [] difficulty speaking or slurred speech []  temporary loss of vision in one eye []  dizziness  Hematologic: []  hx of cancer []  bleeding problems []  problems with blood clotting easily  Endocrine:   []  diabetes []  thyroid disease  GI []  vomiting blood []  blood in stool  GU: []  CKD/renal failure []  HD--[]  M/W/F or []  T/T/S []  burning with urination []   blood in urine  Psychiatric: []  anxiety []  depression  Musculoskeletal: []  arthritis []  joint pain  Integumentary: []  rashes []  ulcers  Constitutional: []  fever []  chills   Physical  Examination  Vitals:   03/01/20 2245 03/01/20 2315  BP: 124/69 122/76  Pulse: 84 73  Resp: 19 20  Temp:    SpO2: 97% 98%   Body mass index is 22.47 kg/m.  General:  NAD, seems confused Gait: Not observed HENT: WNL, normocephalic Pulmonary: normal non-labored breathing, without Rales, rhonchi,  wheezing Cardiac: regular, without  Murmurs, rubs or gallops Abdomen: soft, NT/ND, no masses Vascular Exam/Pulses: 2+ right femoral pulse Right groin incision nearly healed Right below knee popliteal incision with small opening - no significant drainage Right calf warm, erythema, leg swelling Brisk right DP/PT signals Musculoskeletal: no muscle wasting or atrophy  Neurologic: Unable to assess, not cooperative   CBC    Component Value Date/Time   WBC 9.1 03/01/2020 2132   RBC 4.50 03/01/2020 2132   HGB 12.3 (L) 03/01/2020 2132   HGB 15.7 01/10/2020 1146   HCT 39.2 03/01/2020 2132   HCT 46.1 01/10/2020 1146   PLT 275 03/01/2020 2132   PLT 232 01/10/2020 1146   MCV 87.1 03/01/2020 2132   MCV 83 01/10/2020 1146   MCH 27.3 03/01/2020 2132   MCHC 31.4 03/01/2020 2132   RDW 13.5 03/01/2020 2132   RDW 12.8 01/10/2020 1146   LYMPHSABS 0.9 03/01/2020 2132   LYMPHSABS 2.5 03/01/2015 1414   MONOABS 0.5 03/01/2020 2132   EOSABS 0.1 03/01/2020 2132   EOSABS 0.4 03/01/2015 1414   BASOSABS 0.1 03/01/2020 2132   BASOSABS 0.1 03/01/2015 1414    BMET    Component Value Date/Time   NA 139 03/01/2020 2132   NA 142 01/10/2020 1146   K 4.1 03/01/2020 2132   CL 106 03/01/2020 2132   CO2 22 03/01/2020 2132   GLUCOSE 115 (H) 03/01/2020 2132   BUN 15 03/01/2020 2132   BUN 19 01/10/2020 1146   CREATININE 1.14 03/01/2020 2132   CALCIUM 9.5 03/01/2020 2132   GFRNONAA >60 03/01/2020 2132   GFRAA >60 03/01/2020 2132    COAGS: Lab Results  Component Value Date   INR 1.0 03/01/2020   INR 0.9 01/20/2020   INR 1.0 06/30/2019     Non-Invasive Vascular Imaging:     None   ASSESSMENT/PLAN: This is a 69 y.o. male with multiple medical problems as noted above that presents with fever of 102 and increased confusion as well as concern for cellulitis of the right lower extremity with small draining wound of his medial popliteal incision after recent right femoropopliteal bypass with PTFE with Dr. 05/01/2020.  We will plan to admit to vascular surgery for IV antibiotics (vanc/zosyn) and follow his blood cultures.  We will hydrate him overnight given elevated lactic acid.  I have asked ED to get a CT of his right leg without contrast to evaluate for any significant fluid around the graft.  He will need a Covid test.  Pending review of CT we can make plans to treat this with antibiotics versus needing wound exploration in the OR and and/or risk of possible bypass graft excision.  2133, MD [facility] Office: [phone]

## 2020-03-01 NOTE — ED Notes (Signed)
Pt becoming increasingly agitated, verbally aggressive with staff, attempting to get out of bed. Dr. Stevie Kern at bedside, security and [employer] at bedside.

## 2020-03-01 NOTE — Telephone Encounter (Signed)
Patient was seen on 02/28/20 by the PA Corrie

## 2020-03-01 NOTE — ED Notes (Signed)
Vascular MD at bedside.

## 2020-03-02 ENCOUNTER — Ambulatory Visit: Payer: Medicare Other

## 2020-03-02 ENCOUNTER — Inpatient Hospital Stay (HOSPITAL_COMMUNITY): Payer: Medicare Other | Admitting: Anesthesiology

## 2020-03-02 ENCOUNTER — Inpatient Hospital Stay (HOSPITAL_COMMUNITY): Payer: Medicare Other

## 2020-03-02 ENCOUNTER — Encounter (HOSPITAL_COMMUNITY)
Admission: EM | Disposition: A | Discharge status: Home Health | Payer: Self-pay | Source: Home / Self Care | Attending: Surgery

## 2020-03-02 DIAGNOSIS — T8141XA Infection following a procedure, superficial incisional surgical site, initial encounter: Secondary | ICD-10-CM

## 2020-03-02 DIAGNOSIS — I9789 Other postprocedural complications and disorders of the circulatory system, not elsewhere classified: Secondary | ICD-10-CM

## 2020-03-02 HISTORY — PX: I & D EXTREMITY: SHX5045

## 2020-03-02 LAB — URINALYSIS, ROUTINE W REFLEX MICROSCOPIC
Bilirubin Urine: NEGATIVE
Glucose, UA: NEGATIVE mg/dL
Hgb urine dipstick: NEGATIVE
Ketones, ur: NEGATIVE mg/dL
Leukocytes,Ua: NEGATIVE
Nitrite: NEGATIVE
Protein, ur: NEGATIVE mg/dL
Specific Gravity, Urine: 1.02 (ref 1.005–1.030)
pH: 5 (ref 5.0–8.0)

## 2020-03-02 LAB — GRAM STAIN: Gram Stain: NONE SEEN

## 2020-03-02 LAB — GLUCOSE, CAPILLARY: Glucose-Capillary: 68 mg/dL — ABNORMAL LOW (ref 70–99)

## 2020-03-02 LAB — LACTIC ACID, PLASMA: Lactic Acid, Venous: 1.6 mmol/L (ref 0.5–1.9)

## 2020-03-02 IMAGING — CT CT EXTREM LOW W/ CM*R*
2 of 3 series · 5 of 33 positions shown, 6 images · IV contrast (agent unspecified)
Comparison: None.

CONTRAST:  100mL OMNIPAQUE IOHEXOL 300 MG/ML  SOLN
COMPARISON: None.

CONTRAST:  100mL OMNIPAQUE IOHEXOL 300 MG/ML  SOLN

Addendum:
CLINICAL DATA: Soft tissue infection suspected. Concern for
cellulitis. Recent right femoral to popliteal artery bypass graft x2
weeks ago increasing confusion with fever. To this is

EXAM:
CT OF THE LOWER RIGHT EXTREMITY WITH CONTRAST
TECHNIQUE: Multidetector CT imaging of the lower right extremity was performed
according to the standard protocol following intravenous contrast
administration.

[Series 5: lower ext 1.5 st · axial · 0.59mm/px · z∈[-1464,-915]mm · 2 of 795 slices shown, 3 images]
[im 245/795  soft-tissue]
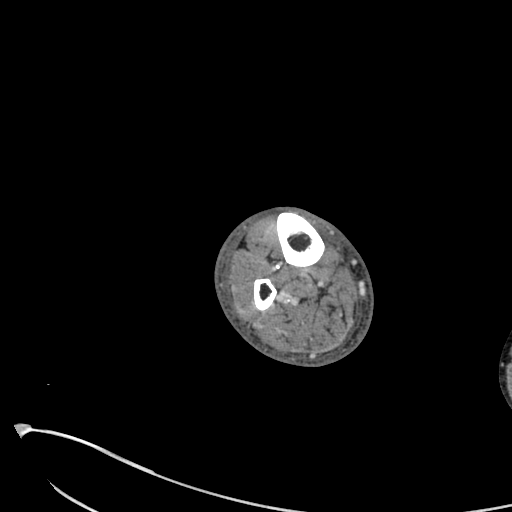
[im 245/795  bone]
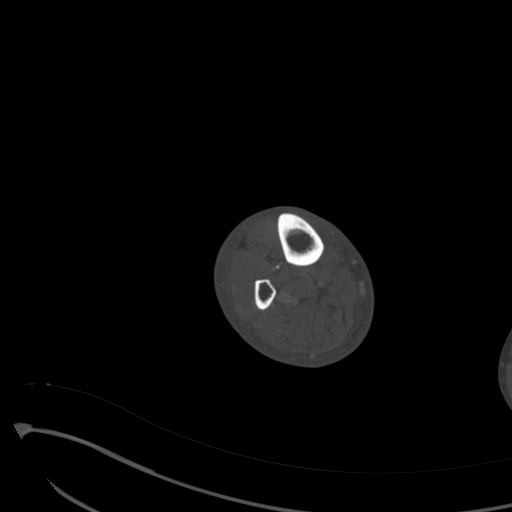
[im 611/795  bone]
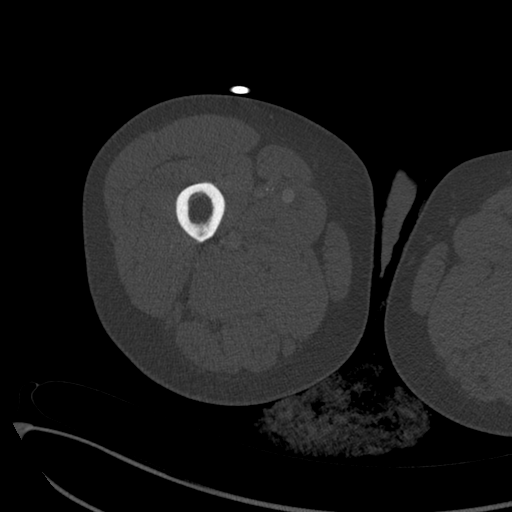

[Series 10: lower ext cor st · coronal · 0.58mm/px · 3 of 177 slices shown]
[im 36/177  bone]
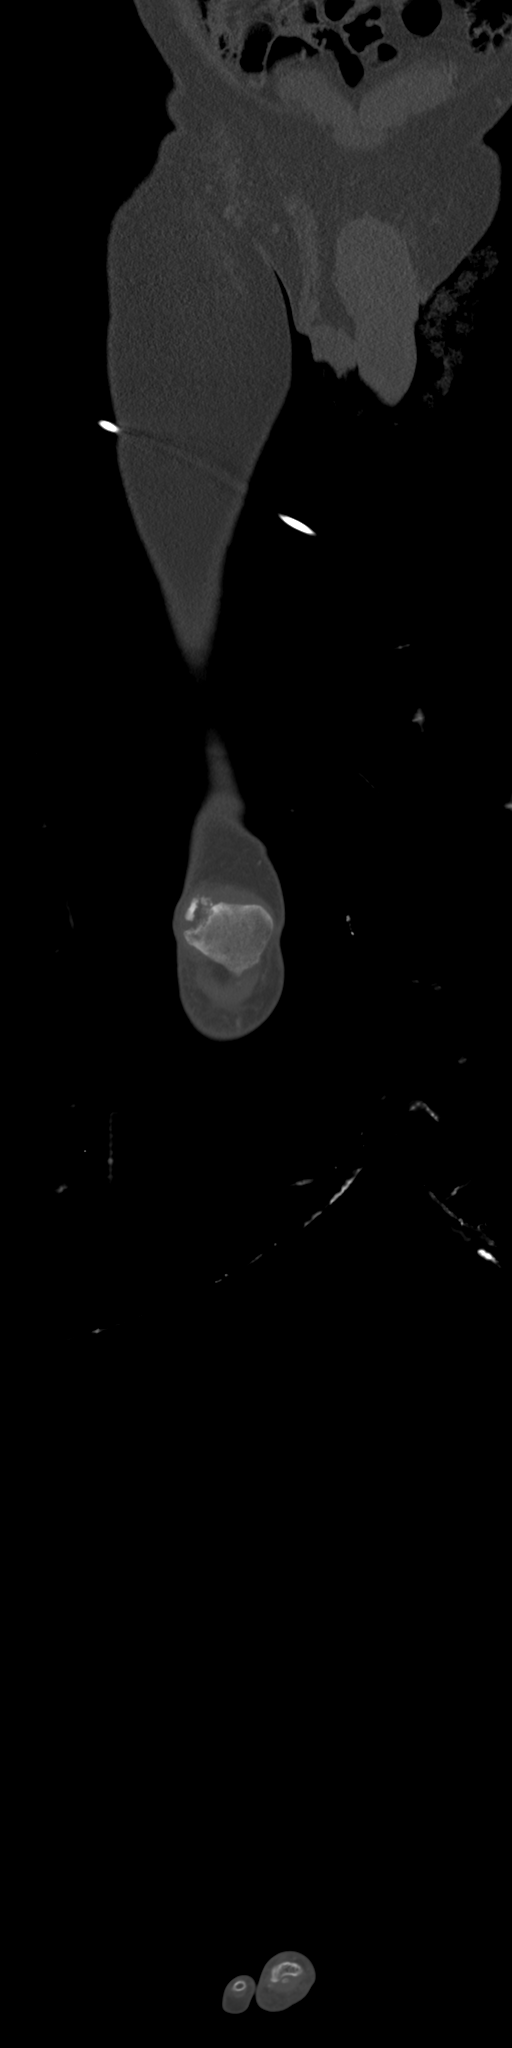
[im 71/177  bone]
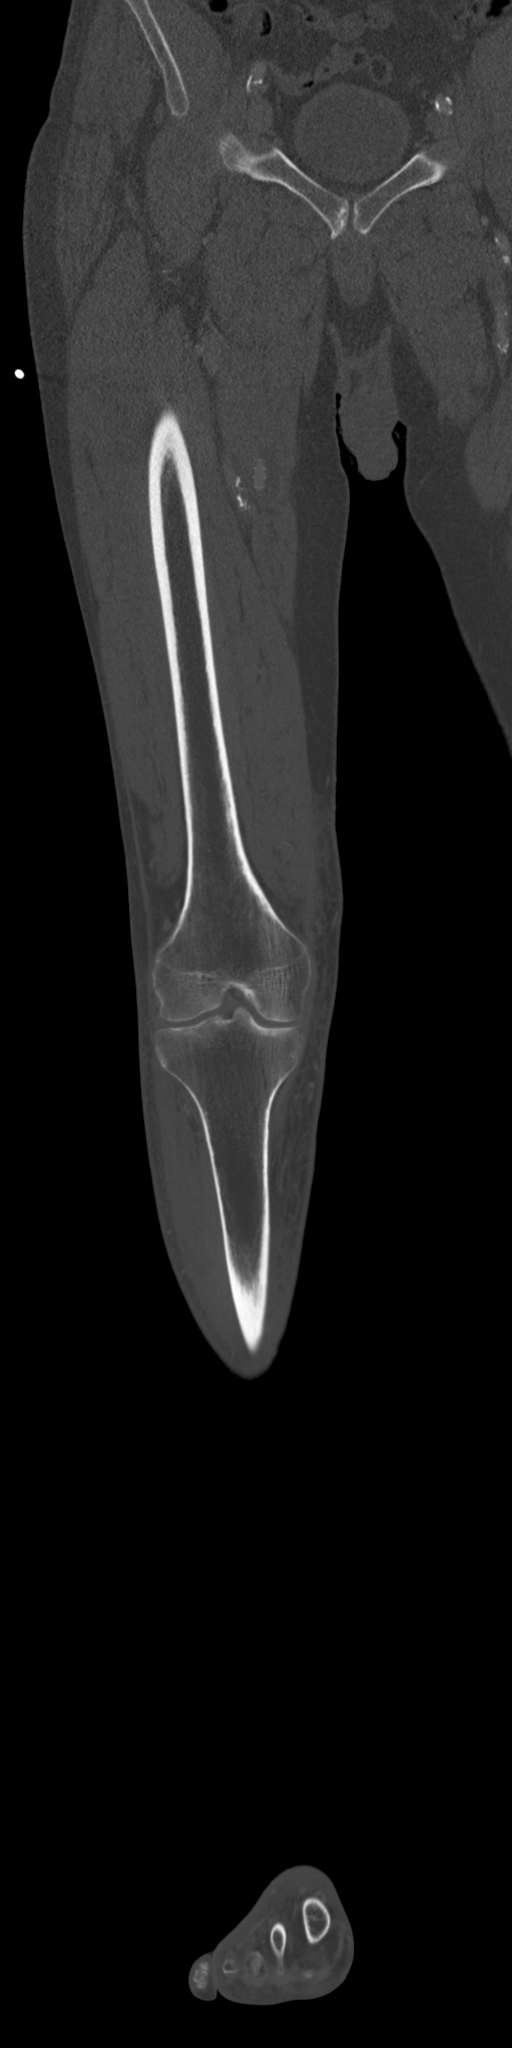
[im 106/177  bone]
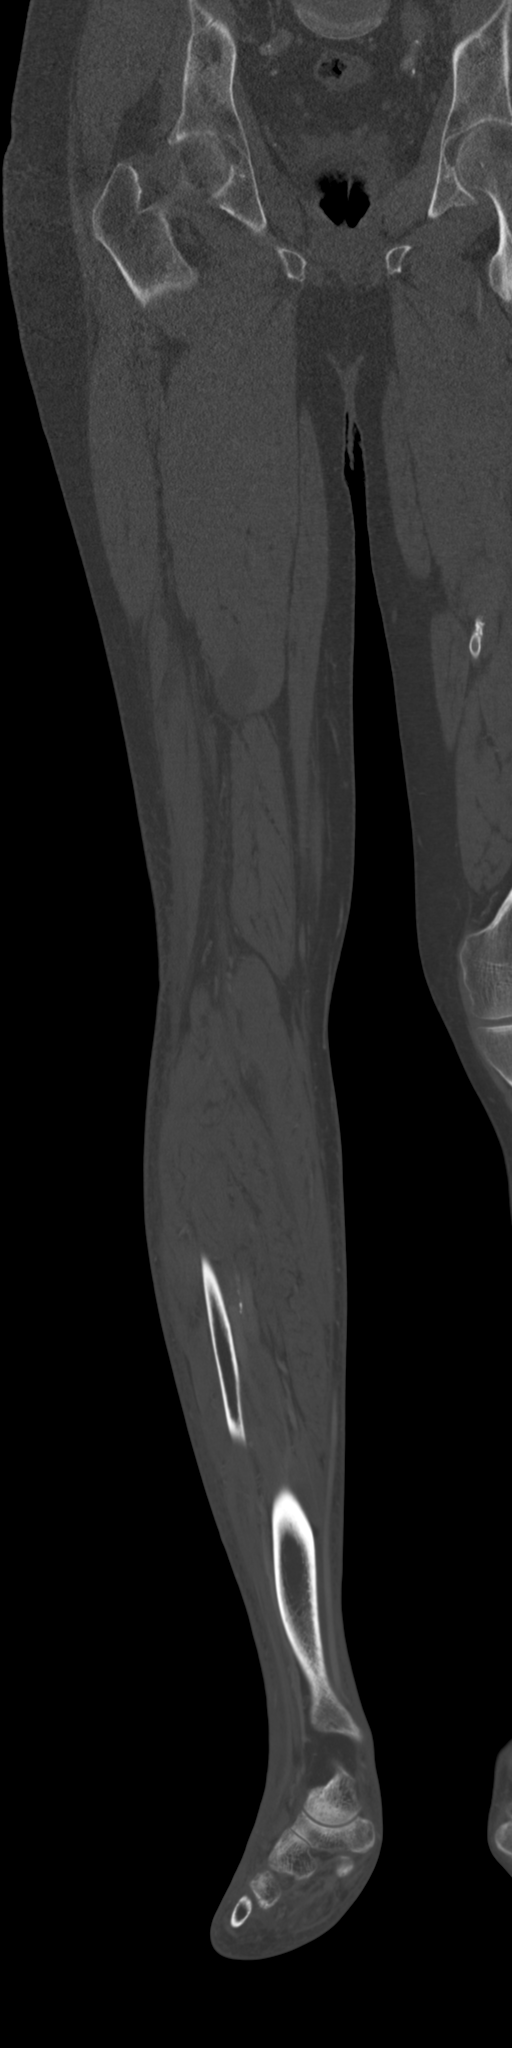

[5 of 33 positions shown; findings below may reference images not displayed]

FINDINGS: Bones/Joint/Cartilage

There is no acute displaced fracture. There are degenerative changes
of the knee.

Ligaments

Suboptimally assessed by CT.

Muscles and Tendons

No evidence for an acute intramuscular hematoma. There is some edema
tracking along the myofascial planes of the calf.

Soft tissues

There is mild bladder wall thickening. The prostate gland is not
significantly enlarged. The visualized portions of the rectum and
sigmoid colon demonstrate scattered colonic diverticula without CT
evidence for diverticulitis. There is no evidence for small bowel
obstruction involving the visualized portions of the small bowel.

There are postsurgical changes of the right groin with some
overlying soft tissue edema and fat stranding. There are enlarged
right inguinal lymph nodes which are presumably reactive. The right
profundus femoris artery is patent proximally. The right superficial
femoral artery is occluded proximally. There are extensive
atherosclerotic changes involving the bilateral iliac vasculature
where visualized

There is a patent right fem the popliteal bypass graft. Surrounding
the mid portions of the graft there is a fluid density collection
measuring approximately 2.5 x 2.7 by 20 cm. This collection
terminates at the level of the distal femoral diaphysis (axial
series 5, image 293). There appears to be a 2 vessel runoff via the
peroneal and posterior tibial arteries. The anterior tibial artery
is likely occluded, however evaluation of the tibial vasculature is
suboptimal on this study secondary to contrast timing.

At the level of the distal anastomosis to the below knee popliteal
artery, there is a fluid collection tracking towards the anastomosis
and 2 to the skin surface that demonstrates rim enhancement. This
collection measures approximately 3.8 by 1.2 by 4.1 cm (axial series
5, image 430). There is no air within this fluid collection. The
collection appears to abut the graft near the anastomosis.

There is no definite acute DVT, however evaluation is limited by
contrast timing.

There is nonspecific lower extremity edema.
IMPRESSION: 1. Grossly patent right lower extremity bypass graft as detailed
above.
2. Rim enhancing 4 cm fluid collection tracking to the skin surface
from the distal anastomosis as detailed above. This is concerning
for an abscess until proven otherwise.
3. Non rim enhancing fluid collection surrounding the mid and
proximal portions of the bypass graft in the thigh. Given the lack
of rim enhancement, this collection is favored to represent a
postoperative seroma or hematoma. An abscess is not entirely
excluded. Fluid sampling can be performed as clinically indicated.
4. Enlarged right inguinal lymph nodes, likely reactive.
5. Occluded right SFA. No definite evidence for a DVT, however
evaluation is limited.
6. Probable 2 vessel runoff to the level of the ankle, however
evaluation is limited by contrast timing and venous contamination.

ADDENDUM:
These results were called by telephone at the time of interpretation
on [DATE] at [DATE] to provider Dr. TOBON, Who verbally
acknowledged these results.

*** End of Addendum ***
FINDINGS: Bones/Joint/Cartilage

There is no acute displaced fracture. There are degenerative changes
of the knee.

Ligaments

Suboptimally assessed by CT.

Muscles and Tendons

No evidence for an acute intramuscular hematoma. There is some edema
tracking along the myofascial planes of the calf.

Soft tissues

There is mild bladder wall thickening. The prostate gland is not
significantly enlarged. The visualized portions of the rectum and
sigmoid colon demonstrate scattered colonic diverticula without CT
evidence for diverticulitis. There is no evidence for small bowel
obstruction involving the visualized portions of the small bowel.

There are postsurgical changes of the right groin with some
overlying soft tissue edema and fat stranding. There are enlarged
right inguinal lymph nodes which are presumably reactive. The right
profundus femoris artery is patent proximally. The right superficial
femoral artery is occluded proximally. There are extensive
atherosclerotic changes involving the bilateral iliac vasculature
where visualized

There is a patent right fem the popliteal bypass graft. Surrounding
the mid portions of the graft there is a fluid density collection
measuring approximately 2.5 x 2.7 by 20 cm. This collection
terminates at the level of the distal femoral diaphysis (axial
series 5, image 293). There appears to be a 2 vessel runoff via the
peroneal and posterior tibial arteries. The anterior tibial artery
is likely occluded, however evaluation of the tibial vasculature is
suboptimal on this study secondary to contrast timing.

At the level of the distal anastomosis to the below knee popliteal
artery, there is a fluid collection tracking towards the anastomosis
and 2 to the skin surface that demonstrates rim enhancement. This
collection measures approximately 3.8 by 1.2 by 4.1 cm (axial series
5, image 430). There is no air within this fluid collection. The
collection appears to abut the graft near the anastomosis.

There is no definite acute DVT, however evaluation is limited by
contrast timing.

There is nonspecific lower extremity edema.
IMPRESSION: 1. Grossly patent right lower extremity bypass graft as detailed
above.
2. Rim enhancing 4 cm fluid collection tracking to the skin surface
from the distal anastomosis as detailed above. This is concerning
for an abscess until proven otherwise.
3. Non rim enhancing fluid collection surrounding the mid and
proximal portions of the bypass graft in the thigh. Given the lack
of rim enhancement, this collection is favored to represent a
postoperative seroma or hematoma. An abscess is not entirely
excluded. Fluid sampling can be performed as clinically indicated.
4. Enlarged right inguinal lymph nodes, likely reactive.
5. Occluded right SFA. No definite evidence for a DVT, however
evaluation is limited.
6. Probable 2 vessel runoff to the level of the ankle, however
evaluation is limited by contrast timing and venous contamination.

## 2020-03-02 IMAGING — CT CT HEAD W/O CM
4 series · 16 of 47 positions shown, 18 images · non-contrast
Comparison: Head CT [DATE].

CLINICAL DATA: 68-year-old male with recent femoral to popliteal
artery bypass graft. Falls, headache and confusion.

EXAM:
CT HEAD WITHOUT CONTRAST
TECHNIQUE: Contiguous axial images were obtained from the base of the skull
through the vertex without intravenous contrast.

[Series 3: head without · axial · non-contrast · 0.45mm/px · z∈[-68,+52]mm · 7 of 33 slices shown, 9 images]
[im 5/33  brain]
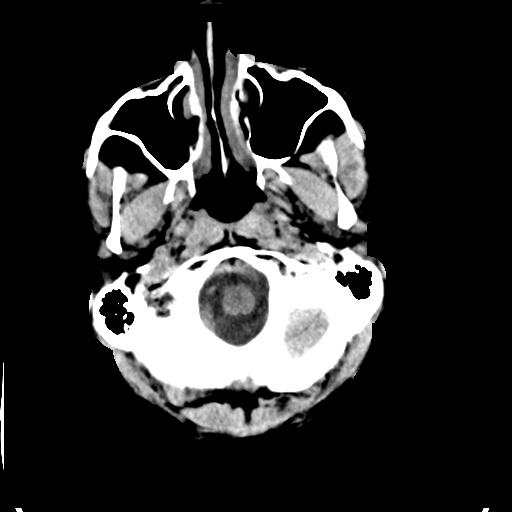
[im 5/33  bone]
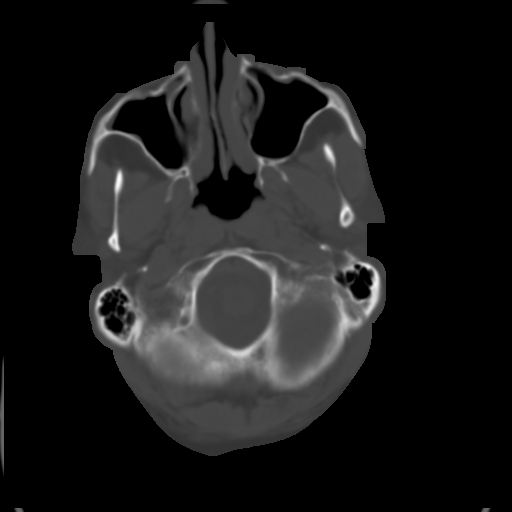
[im 9/33  brain]
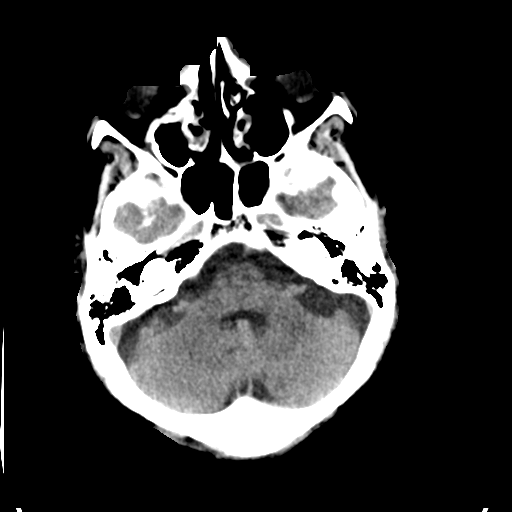
[im 13/33  brain]
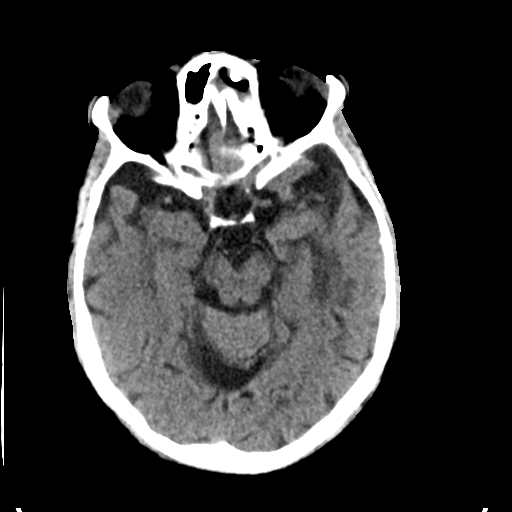
[im 17/33  brain]
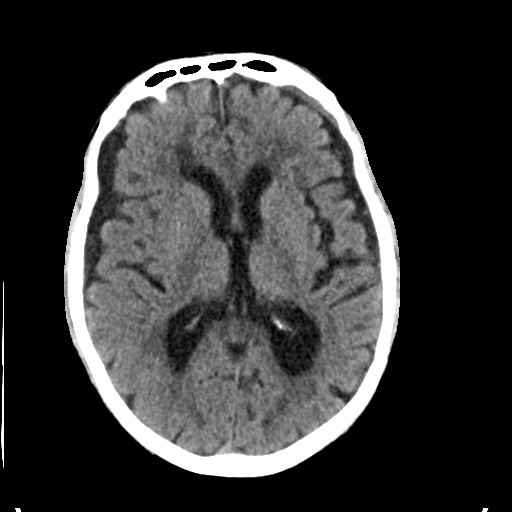
[im 21/33  brain]
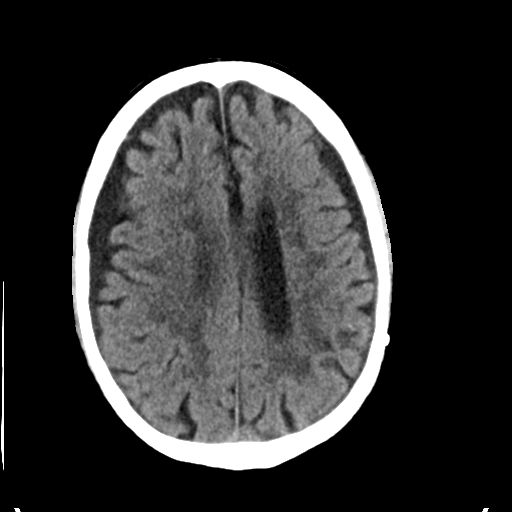
[im 21/33  bone]
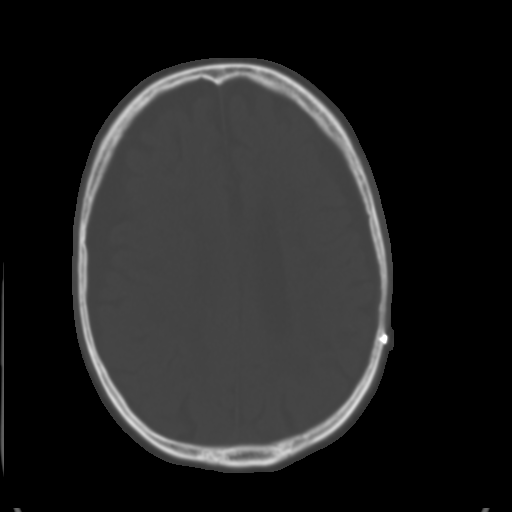
[im 25/33  brain]
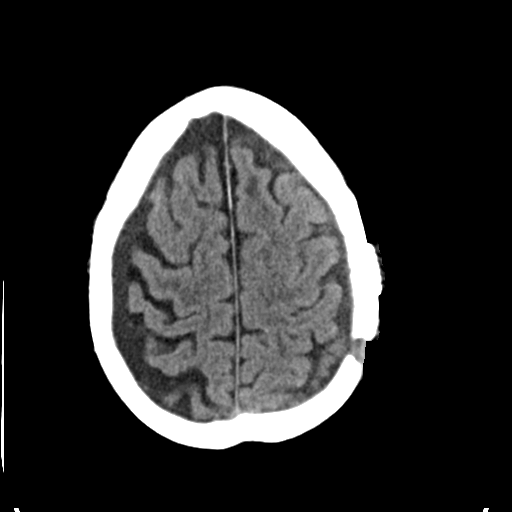
[im 29/33  brain]
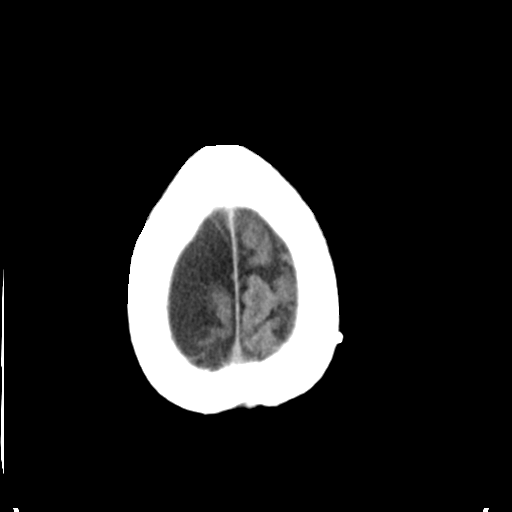

[Series 4: head bone · axial · 0.45mm/px · z∈[-72,-40]mm · 3 of 83 slices shown]
[im 9/83  bone]
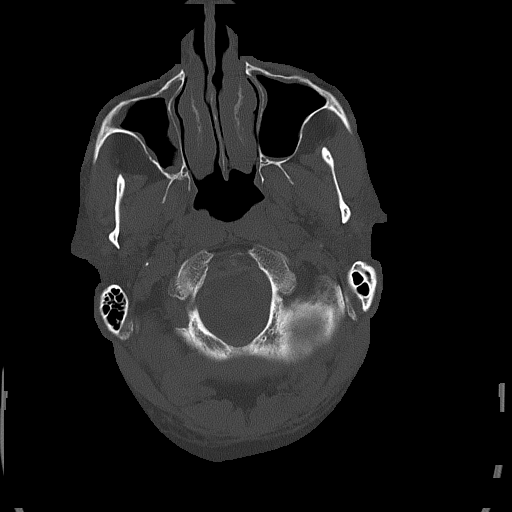
[im 17/83  bone]
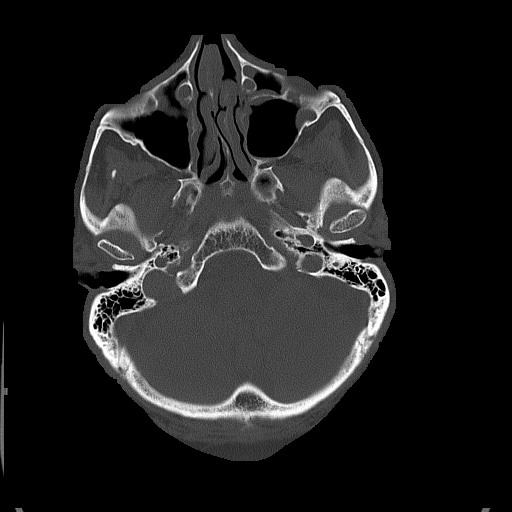
[im 25/83  bone]
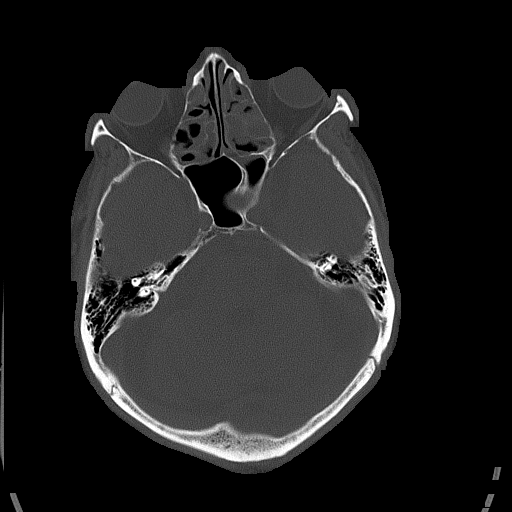

[Series 5: head without cor · coronal · non-contrast · 0.40mm/px · 3 of 77 slices shown]
[im 26/77  brain]
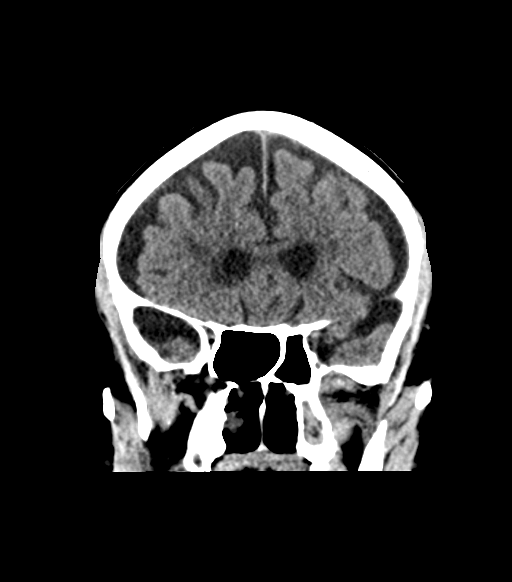
[im 34/77  brain]
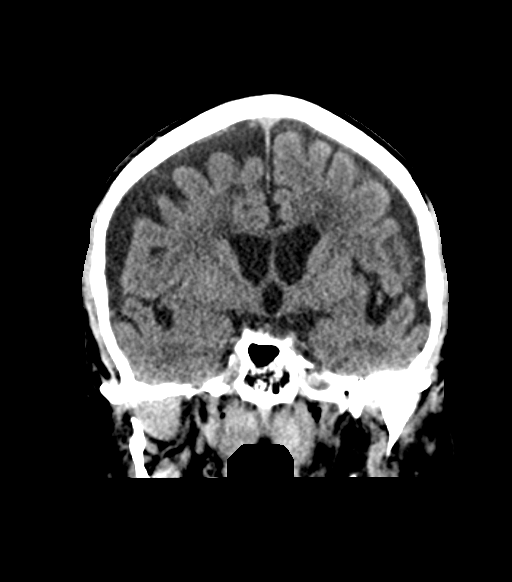
[im 43/77  brain]
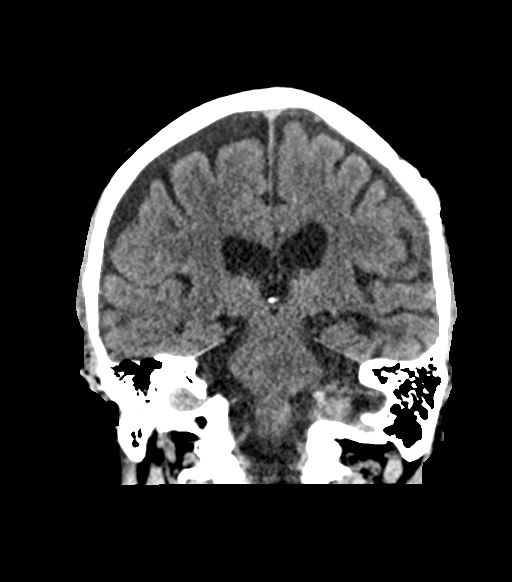

[Series 6: head without sag · sagittal · non-contrast · 0.40mm/px · 3 of 67 slices shown]
[im 23/67  brain]
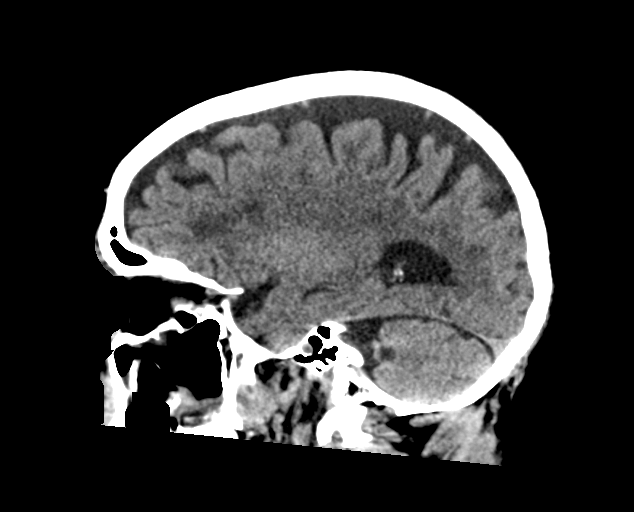
[im 34/67  brain]
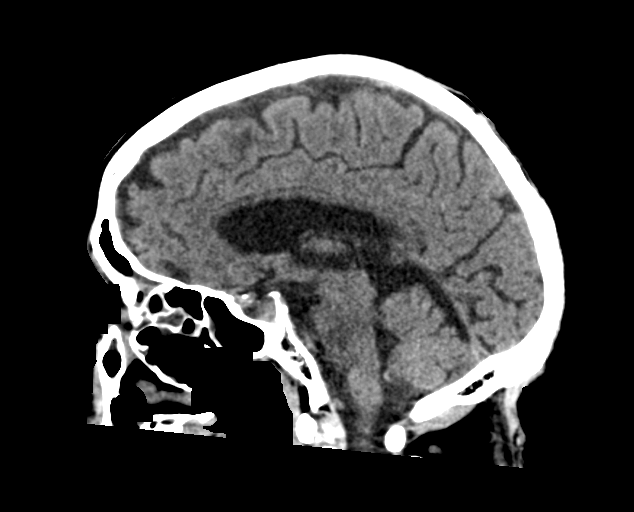
[im 45/67  brain]
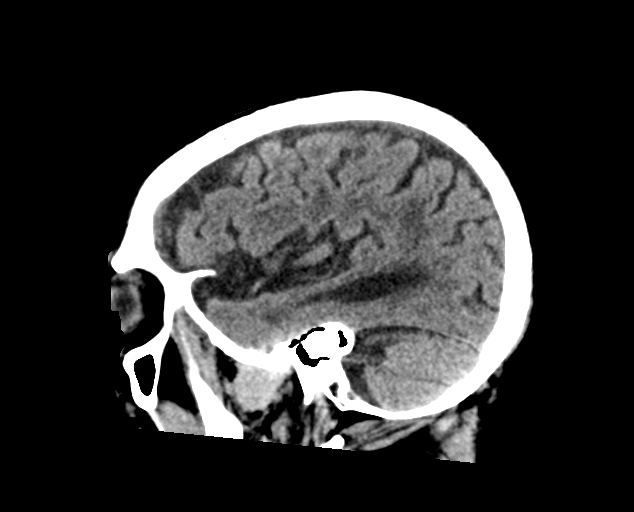

[16 of 47 positions shown; findings below may reference images not displayed]

FINDINGS: Brain: Stable cerebral volume. Chronic partially empty sella. Small
or trace residual low-density left subdural hematoma along the
anterior left frontal convexity, decreased since [DATE],
image 18) now 2-3 mm. Dural thickening underlying the left
craniotomy. No new intracranial hemorrhage identified. Stable right
side extra-axial CSF.

No intracranial mass effect. No ventriculomegaly. Stable gray-white
matter differentiation throughout the brain. Patchy and confluent
bilateral white matter hypodensity. No cortically based acute
infarct identified.

Vascular: No suspicious intracranial vascular hyperdensity.

Skull: Stable prior left parietal craniotomy. Osteopenia. No acute
osseous abnormality identified.

Sinuses/Orbits: Bilateral paranasal sinus mucosal thickening has not
significantly changed since [REDACTED]. Tympanic cavities and mastoids
remain clear.

Other: No acute orbit or scalp soft tissue finding.
IMPRESSION: 1. Continued regression of small left subdural hematoma, now trace
along the anterior frontal convexity.
2. No new intracranial hemorrhage or new intracranial abnormality
identified.
3. Stable paranasal sinus disease.

## 2020-03-02 SURGERY — IRRIGATION AND DEBRIDEMENT EXTREMITY
Anesthesia: General | Site: Leg Lower | Laterality: Right

## 2020-03-02 MED ORDER — ACETAMINOPHEN 325 MG PO TABS: 650.0000 mg | ORAL_TABLET | Freq: Four times a day (QID) | ORAL | Status: DC | PRN

## 2020-03-02 MED ORDER — ALUM & MAG HYDROXIDE-SIMETH 200-200-20 MG/5ML PO SUSP: 15.0000 mL | ORAL | Status: DC | PRN

## 2020-03-02 MED ORDER — METOPROLOL TARTRATE 25 MG PO TABS
25.0000 mg | ORAL_TABLET | Freq: Two times a day (BID) | ORAL | Status: DC
  Administered 2020-03-02 – 2020-03-12 (×20): 25 mg via ORAL
  Filled 2020-03-02 (×21): qty 1

## 2020-03-02 MED ORDER — NICOTINE 21 MG/24HR TD PT24
21.0000 mg | MEDICATED_PATCH | Freq: Every day | TRANSDERMAL | Status: DC
  Administered 2020-03-02 – 2020-03-12 (×7): 21 mg via TRANSDERMAL
  Filled 2020-03-02 (×9): qty 1

## 2020-03-02 MED ORDER — PROPOFOL 10 MG/ML IV BOLUS
INTRAVENOUS | Status: AC
  Filled 2020-03-02: qty 40

## 2020-03-02 MED ORDER — GUAIFENESIN-DM 100-10 MG/5ML PO SYRP: 15.0000 mL | ORAL_SOLUTION | ORAL | Status: DC | PRN

## 2020-03-02 MED ORDER — ACETAMINOPHEN 325 MG RE SUPP: 325.0000 mg | RECTAL | Status: DC | PRN

## 2020-03-02 MED ORDER — PROPOFOL 10 MG/ML IV BOLUS: INTRAVENOUS | Status: DC | PRN

## 2020-03-02 MED ORDER — VANCOMYCIN HCL 500 MG IV SOLR
INTRAVENOUS | Status: DC | PRN
  Administered 2020-03-02: 500 mg

## 2020-03-02 MED ORDER — METOPROLOL TARTRATE 5 MG/5ML IV SOLN: 2.0000 mg | INTRAVENOUS | Status: DC | PRN

## 2020-03-02 MED ORDER — ONDANSETRON HCL 4 MG/2ML IJ SOLN
INTRAMUSCULAR | Status: DC | PRN
  Administered 2020-03-02: 4 mg via INTRAVENOUS

## 2020-03-02 MED ORDER — LORAZEPAM 2 MG/ML IJ SOLN
2.0000 mg | Freq: Four times a day (QID) | INTRAMUSCULAR | Status: DC | PRN
  Administered 2020-03-02 – 2020-03-10 (×6): 2 mg via INTRAVENOUS
  Filled 2020-03-02 (×5): qty 1

## 2020-03-02 MED ORDER — QUETIAPINE FUMARATE 100 MG PO TABS
200.0000 mg | ORAL_TABLET | Freq: Every day | ORAL | Status: DC
  Administered 2020-03-02 – 2020-03-11 (×11): 200 mg via ORAL
  Filled 2020-03-02 (×11): qty 2

## 2020-03-02 MED ORDER — IOHEXOL 300 MG/ML  SOLN
100.0000 mL | Freq: Once | INTRAMUSCULAR | Status: AC | PRN
  Administered 2020-03-02: 100 mL via INTRAVENOUS

## 2020-03-02 MED ORDER — EPHEDRINE 5 MG/ML INJ
INTRAVENOUS | Status: AC
  Filled 2020-03-02: qty 20

## 2020-03-02 MED ORDER — GENTAMICIN SULFATE 40 MG/ML IJ SOLN
INTRAMUSCULAR | Status: AC
  Filled 2020-03-02: qty 4

## 2020-03-02 MED ORDER — CEPHALEXIN 500 MG PO CAPS
500.0000 mg | ORAL_CAPSULE | Freq: Three times a day (TID) | ORAL | Status: DC
  Administered 2020-03-02 – 2020-03-03 (×5): 500 mg via ORAL
  Filled 2020-03-02 (×6): qty 1

## 2020-03-02 MED ORDER — PROPOFOL 10 MG/ML IV BOLUS
INTRAVENOUS | Status: DC | PRN
  Administered 2020-03-02: 120 mg via INTRAVENOUS
  Administered 2020-03-02: 30 mg via INTRAVENOUS

## 2020-03-02 MED ORDER — HYDRALAZINE HCL 20 MG/ML IJ SOLN: 5.0000 mg | INTRAMUSCULAR | Status: DC | PRN

## 2020-03-02 MED ORDER — POTASSIUM CHLORIDE CRYS ER 20 MEQ PO TBCR: 20.0000 meq | EXTENDED_RELEASE_TABLET | Freq: Every day | ORAL | Status: DC | PRN

## 2020-03-02 MED ORDER — LORAZEPAM 2 MG/ML IJ SOLN
INTRAMUSCULAR | Status: AC
  Filled 2020-03-02: qty 1

## 2020-03-02 MED ORDER — LACTATED RINGERS IV SOLN: INTRAVENOUS | Status: DC

## 2020-03-02 MED ORDER — NON FORMULARY
Status: DC | PRN
  Administered 2020-03-02: 4 mL via SURGICAL_CAVITY

## 2020-03-02 MED ORDER — CLOPIDOGREL BISULFATE 75 MG PO TABS
75.0000 mg | ORAL_TABLET | Freq: Every day | ORAL | Status: DC
  Administered 2020-03-02 – 2020-03-12 (×11): 75 mg via ORAL
  Filled 2020-03-02 (×11): qty 1

## 2020-03-02 MED ORDER — ENOXAPARIN SODIUM 40 MG/0.4ML ~~LOC~~ SOLN
40.0000 mg | Freq: Every day | SUBCUTANEOUS | Status: DC
  Administered 2020-03-02 – 2020-03-12 (×11): 40 mg via SUBCUTANEOUS
  Filled 2020-03-02 (×11): qty 0.4

## 2020-03-02 MED ORDER — EPHEDRINE SULFATE 50 MG/ML IJ SOLN
INTRAMUSCULAR | Status: DC | PRN
  Administered 2020-03-02: 10 mg via INTRAVENOUS

## 2020-03-02 MED ORDER — FENTANYL CITRATE (PF) 250 MCG/5ML IJ SOLN
INTRAMUSCULAR | Status: AC
  Filled 2020-03-02: qty 5

## 2020-03-02 MED ORDER — IRBESARTAN 300 MG PO TABS
300.0000 mg | ORAL_TABLET | Freq: Every day | ORAL | Status: DC
  Administered 2020-03-02 – 2020-03-12 (×11): 300 mg via ORAL
  Filled 2020-03-02 (×11): qty 1

## 2020-03-02 MED ORDER — GABAPENTIN 100 MG PO CAPS
100.0000 mg | ORAL_CAPSULE | Freq: Two times a day (BID) | ORAL | Status: DC
  Administered 2020-03-02 – 2020-03-12 (×22): 100 mg via ORAL
  Filled 2020-03-02 (×22): qty 1

## 2020-03-02 MED ORDER — GLYCOPYRROLATE PF 0.2 MG/ML IJ SOSY
PREFILLED_SYRINGE | INTRAMUSCULAR | Status: AC
  Filled 2020-03-02: qty 1

## 2020-03-02 MED ORDER — SODIUM CHLORIDE 0.9 % IV SOLN: INTRAVENOUS | Status: DC

## 2020-03-02 MED ORDER — LIDOCAINE 2% (20 MG/ML) 5 ML SYRINGE
INTRAMUSCULAR | Status: AC
  Filled 2020-03-02: qty 5

## 2020-03-02 MED ORDER — LABETALOL HCL 5 MG/ML IV SOLN: 10.0000 mg | INTRAVENOUS | Status: DC | PRN

## 2020-03-02 MED ORDER — ACETAMINOPHEN 325 MG PO TABS
325.0000 mg | ORAL_TABLET | ORAL | Status: DC | PRN
  Administered 2020-03-04 – 2020-03-10 (×10): 650 mg via ORAL
  Filled 2020-03-02 (×10): qty 2

## 2020-03-02 MED ORDER — LIDOCAINE HCL (CARDIAC) PF 100 MG/5ML IV SOSY
PREFILLED_SYRINGE | INTRAVENOUS | Status: DC | PRN
  Administered 2020-03-02: 100 mg via INTRATRACHEAL

## 2020-03-02 MED ORDER — TAMSULOSIN HCL 0.4 MG PO CAPS
0.8000 mg | ORAL_CAPSULE | Freq: Every day | ORAL | Status: DC
  Administered 2020-03-02 – 2020-03-12 (×11): 0.8 mg via ORAL
  Filled 2020-03-02 (×11): qty 2

## 2020-03-02 MED ORDER — PHENYLEPHRINE HCL (PRESSORS) 10 MG/ML IV SOLN
INTRAVENOUS | Status: DC | PRN
  Administered 2020-03-02 (×2): 80 ug via INTRAVENOUS

## 2020-03-02 MED ORDER — SODIUM CHLORIDE 0.9 % IV SOLN: 500.0000 mL | Freq: Once | INTRAVENOUS | Status: DC | PRN

## 2020-03-02 MED ORDER — MIDAZOLAM HCL 2 MG/2ML IJ SOLN
INTRAMUSCULAR | Status: DC | PRN
  Administered 2020-03-02: 2 mg via INTRAVENOUS

## 2020-03-02 MED ORDER — VANCOMYCIN HCL 500 MG IV SOLR
INTRAVENOUS | Status: AC
  Filled 2020-03-02: qty 500

## 2020-03-02 MED ORDER — MIDAZOLAM HCL 2 MG/2ML IJ SOLN
INTRAMUSCULAR | Status: AC
  Filled 2020-03-02: qty 2

## 2020-03-02 MED ORDER — FENTANYL CITRATE (PF) 250 MCG/5ML IJ SOLN
INTRAMUSCULAR | Status: DC | PRN
  Administered 2020-03-02 (×2): 50 ug via INTRAVENOUS
  Administered 2020-03-02: 25 ug via INTRAVENOUS

## 2020-03-02 MED ORDER — ASPIRIN EC 81 MG PO TBEC
81.0000 mg | DELAYED_RELEASE_TABLET | Freq: Every day | ORAL | Status: DC
  Administered 2020-03-02 – 2020-03-12 (×11): 81 mg via ORAL
  Filled 2020-03-02 (×11): qty 1

## 2020-03-02 MED ORDER — ATORVASTATIN CALCIUM 80 MG PO TABS
80.0000 mg | ORAL_TABLET | Freq: Every day | ORAL | Status: DC
  Administered 2020-03-02 – 2020-03-12 (×11): 80 mg via ORAL
  Filled 2020-03-02 (×11): qty 1

## 2020-03-02 MED ORDER — PANTOPRAZOLE SODIUM 40 MG PO TBEC
40.0000 mg | DELAYED_RELEASE_TABLET | Freq: Every day | ORAL | Status: DC
  Administered 2020-03-02 – 2020-03-12 (×11): 40 mg via ORAL
  Filled 2020-03-02 (×11): qty 1

## 2020-03-02 MED ORDER — MAGNESIUM SULFATE 2 GM/50ML IV SOLN: 2.0000 g | Freq: Every day | INTRAVENOUS | Status: DC | PRN

## 2020-03-02 MED ORDER — ONDANSETRON HCL 4 MG/2ML IJ SOLN
INTRAMUSCULAR | Status: AC
  Filled 2020-03-02: qty 2

## 2020-03-02 MED ORDER — PHENYLEPHRINE 40 MCG/ML (10ML) SYRINGE FOR IV PUSH (FOR BLOOD PRESSURE SUPPORT)
PREFILLED_SYRINGE | INTRAVENOUS | Status: AC
  Filled 2020-03-02: qty 30

## 2020-03-02 MED ORDER — ONDANSETRON HCL 4 MG/2ML IJ SOLN: 4.0000 mg | Freq: Four times a day (QID) | INTRAMUSCULAR | Status: DC | PRN

## 2020-03-02 MED ORDER — PHENYLEPHRINE HCL-NACL 10-0.9 MG/250ML-% IV SOLN
INTRAVENOUS | Status: DC | PRN
  Administered 2020-03-02: 35 ug/min via INTRAVENOUS

## 2020-03-02 MED ORDER — DOCUSATE SODIUM 100 MG PO CAPS
100.0000 mg | ORAL_CAPSULE | Freq: Every day | ORAL | Status: DC
  Administered 2020-03-02 – 2020-03-12 (×10): 100 mg via ORAL
  Filled 2020-03-02 (×11): qty 1

## 2020-03-02 MED ORDER — PHENOL 1.4 % MT LIQD: 1.0000 | OROMUCOSAL | Status: DC | PRN

## 2020-03-02 MED ORDER — HEPARIN SODIUM (PORCINE) 1000 UNIT/ML IJ SOLN
INTRAMUSCULAR | Status: AC
  Filled 2020-03-02: qty 1

## 2020-03-02 SURGICAL SUPPLY — 40 items
BAG ISL DRAPE 18X18 STRL (DRAPES) ×1
BAG ISOLATION DRAPE 18X18 (DRAPES) IMPLANT
BNDG ELASTIC 4X5.8 VLCR STR LF (GAUZE/BANDAGES/DRESSINGS) ×1 IMPLANT
BNDG ELASTIC 6X5.8 VLCR STR LF (GAUZE/BANDAGES/DRESSINGS) IMPLANT
BNDG GAUZE ELAST 4 BULKY (GAUZE/BANDAGES/DRESSINGS) IMPLANT
CANISTER SUCT 3000ML PPV (MISCELLANEOUS) ×2 IMPLANT
COVER SURGICAL LIGHT HANDLE (MISCELLANEOUS) ×2 IMPLANT
COVER WAND RF STERILE (DRAPES) ×2 IMPLANT
DRAPE HALF SHEET 40X57 (DRAPES) ×3 IMPLANT
DRAPE INCISE IOBAN 66X45 STRL (DRAPES) ×2 IMPLANT
DRAPE ISOLATION BAG 18X18 (DRAPES) ×2
DRAPE ORTHO SPLIT 77X108 STRL (DRAPES) ×4
DRAPE SURG ORHT 6 SPLT 77X108 (DRAPES) ×2 IMPLANT
DRSG KUZMA FLUFF (GAUZE/BANDAGES/DRESSINGS) ×1 IMPLANT
ELECT REM PT RETURN 9FT ADLT (ELECTROSURGICAL) ×2
ELECTRODE REM PT RTRN 9FT ADLT (ELECTROSURGICAL) ×1 IMPLANT
GAUZE SPONGE 4X4 12PLY STRL (GAUZE/BANDAGES/DRESSINGS) ×2 IMPLANT
GAUZE SPONGE 4X4 12PLY STRL LF (GAUZE/BANDAGES/DRESSINGS) ×1 IMPLANT
GLOVE BIO SURGEON STRL SZ7.5 (GLOVE) ×2 IMPLANT
GLOVE BIOGEL PI IND STRL 8 (GLOVE) ×1 IMPLANT
GLOVE BIOGEL PI INDICATOR 8 (GLOVE) ×1
GOWN STRL REUS W/ TWL LRG LVL3 (GOWN DISPOSABLE) ×3 IMPLANT
GOWN STRL REUS W/TWL LRG LVL3 (GOWN DISPOSABLE) ×6
KIT BASIN OR (CUSTOM PROCEDURE TRAY) ×2 IMPLANT
KIT STIMULAN 5CC (Orthopedic Implant) ×1 IMPLANT
KIT TURNOVER KIT B (KITS) ×2 IMPLANT
NS IRRIG 1000ML POUR BTL (IV SOLUTION) ×2 IMPLANT
PACK CV ACCESS (CUSTOM PROCEDURE TRAY) IMPLANT
PACK GENERAL/GYN (CUSTOM PROCEDURE TRAY) IMPLANT
PAD ARMBOARD 7.5X6 YLW CONV (MISCELLANEOUS) ×4 IMPLANT
SUT ETHILON 3 0 PS 1 (SUTURE) IMPLANT
SUT VIC AB 2-0 CTB1 (SUTURE) IMPLANT
SUT VIC AB 3-0 SH 27 (SUTURE)
SUT VIC AB 3-0 SH 27X BRD (SUTURE) IMPLANT
SUT VICRYL 4-0 PS2 18IN ABS (SUTURE) IMPLANT
SWAB COLLECTION DEVICE MRSA (MISCELLANEOUS) ×1 IMPLANT
SWAB CULTURE ESWAB REG 1ML (MISCELLANEOUS) ×1 IMPLANT
SYR 5ML LL (SYRINGE) ×1 IMPLANT
TOWEL GREEN STERILE (TOWEL DISPOSABLE) ×2 IMPLANT
WATER STERILE IRR 1000ML POUR (IV SOLUTION) ×2 IMPLANT

## 2020-03-02 NOTE — Anesthesia Postprocedure Evaluation (Signed)
Anesthesia Post Note  Patient: Lonnie [name]  Procedure(s) Performed: IRRIGATION AND DEBRIDEMENT EXTREMITY right  lower leg  with Antibiotic beads. (Right Leg Lower)     Patient location during evaluation: PACU Anesthesia Type: General Level of consciousness: awake Pain management: pain level controlled Respiratory status: spontaneous breathing Cardiovascular status: stable Postop Assessment: no apparent nausea or vomiting Anesthetic complications: no    Last Vitals:  Vitals:   03/02/20 1315 03/02/20 1330  BP: 132/79 130/78  Pulse: 60 62  Resp: 14 15  Temp:    SpO2: 100% 100%    Last Pain:  Vitals:   03/02/20 1330  TempSrc:   PainSc: 0-No pain                 Riana Tessmer

## 2020-03-02 NOTE — Anesthesia Procedure Notes (Signed)
Procedure Name: LMA Insertion Date/Time: 03/02/2020 12:04 PM Performed by: Fransisca Kaufmann, CRNA Pre-anesthesia Checklist: Patient identified, Emergency Drugs available, Suction available and Patient being monitored Patient Re-evaluated:Patient Re-evaluated prior to induction Oxygen Delivery Method: Circle System Utilized Preoxygenation: Pre-oxygenation with 100% oxygen Induction Type: IV induction Ventilation: Mask ventilation without difficulty LMA: LMA inserted LMA Size: 4.0 Number of attempts: 1 Airway Equipment and Method: Bite block Placement Confirmation: positive ETCO2 Tube secured with: Tape Dental Injury: Teeth and Oropharynx as per pre-operative assessment

## 2020-03-02 NOTE — Transfer of Care (Signed)
Immediate Anesthesia Transfer of Care Note  Patient: Lonnie [name]  Procedure(s) Performed: IRRIGATION AND DEBRIDEMENT EXTREMITY right  lower leg  with Antibiotic beads. (Right Leg Lower)  Patient Location: PACU  Anesthesia Type:General  Level of Consciousness: awake, alert , oriented and sedated  Airway & Oxygen Therapy: Patient Spontanous Breathing and Patient connected to nasal cannula oxygen  Post-op Assessment: Report given to RN, Post -op Vital signs reviewed and stable and Patient moving all extremities  Post vital signs: Reviewed and stable  Last Vitals:  Vitals Value Taken Time  BP 124/72 03/02/20 1254  Temp    Pulse 53 03/02/20 1256  Resp 14 03/02/20 1259  SpO2 98 % 03/02/20 1256  Vitals shown include unvalidated device data.  Last Pain:  Vitals:   03/02/20 0600  TempSrc: Oral  PainSc: 0-No pain         Complications: No apparent anesthesia complications

## 2020-03-02 NOTE — Progress Notes (Signed)
Progress Note    03/02/2020 7:47 AM * No surgery date entered *  Subjective:  Very upset and wants to go home.  Wants to know what the treatment is-I told him that he may need his graft removed bc if it is infected and we don't treat him, he could die.  Says he hates the hospital and does not want to have surgery.    Tm 100.8 now 100.6 HR 60's-80's NSR 500'X-381'W systolic 299% RA  Vitals:   03/02/20 0500 03/02/20 0600  BP: (!) 148/82 (!) 141/73  Pulse: 67 73  Resp: 18 (!) 21  Temp:  (!) 100.6 F (38.1 C)  SpO2:  93%    Physical Exam: Cardiac:  regular Lungs:  Non labored  Incisions:  Right below knee incision with small opening with some drainage. Extremities:  Brisk doppler signals right DP/PT; mild swelling but is improved from when I saw him back in February.  Minimal erythema around incision.  Right groin incision is clean and well healed.    CBC    Component Value Date/Time   WBC 9.1 03/01/2020 2132   RBC 4.50 03/01/2020 2132   HGB 12.3 (L) 03/01/2020 2132   HGB 15.7 01/10/2020 1146   HCT 39.2 03/01/2020 2132   HCT 46.1 01/10/2020 1146   PLT 275 03/01/2020 2132   PLT 232 01/10/2020 1146   MCV 87.1 03/01/2020 2132   MCV 83 01/10/2020 1146   MCH 27.3 03/01/2020 2132   MCHC 31.4 03/01/2020 2132   RDW 13.5 03/01/2020 2132   RDW 12.8 01/10/2020 1146   LYMPHSABS 0.9 03/01/2020 2132   LYMPHSABS 2.5 03/01/2015 1414   MONOABS 0.5 03/01/2020 2132   EOSABS 0.1 03/01/2020 2132   EOSABS 0.4 03/01/2015 1414   BASOSABS 0.1 03/01/2020 2132   BASOSABS 0.1 03/01/2015 1414    BMET    Component Value Date/Time   NA 139 03/01/2020 2132   NA 142 01/10/2020 1146   K 4.1 03/01/2020 2132   CL 106 03/01/2020 2132   CO2 22 03/01/2020 2132   GLUCOSE 115 (H) 03/01/2020 2132   BUN 15 03/01/2020 2132   BUN 19 01/10/2020 1146   CREATININE 1.14 03/01/2020 2132   CALCIUM 9.5 03/01/2020 2132   GFRNONAA >60 03/01/2020 2132   GFRAA >60 03/01/2020 2132    INR      Component Value Date/Time   INR 1.0 03/01/2020 2132     Intake/Output Summary (Last 24 hours) at 03/02/2020 0747 Last data filed at 03/02/2020 0344 Gross per 24 hour  Intake 50 ml  Output --  Net 50 ml   CT head 03/02/2020: IMPRESSION: 1. Continued regression of small left subdural hematoma, now trace along the anterior frontal convexity. 2. No new intracranial hemorrhage or new intracranial abnormality identified. 3. Stable paranasal sinus disease.  CTA RLE 03/02/2020: IMPRESSION: 1. Grossly patent right lower extremity bypass graft as detailed above. 2. Rim enhancing 4 cm fluid collection tracking to the skin surface from the distal anastomosis as detailed above. This is concerning for an abscess until proven otherwise. 3. Non rim enhancing fluid collection surrounding the mid and proximal portions of the bypass graft in the thigh. Given the lack of rim enhancement, this collection is favored to represent a postoperative seroma or hematoma. An abscess is not entirely excluded. Fluid sampling can be performed as clinically indicated. 4. Enlarged right inguinal lymph nodes, likely reactive. 5. Occluded right SFA. No definite evidence for a DVT, however evaluation is limited. 6. Probable  2 vessel runoff to the level of the ankle, however evaluation is limited by contrast timing and venous contamination.   Assessment:  69 y.o. male is s/p:  right femoral to popliteal bypass grafting on 01/20/2020 by Dr. Myra Gianotti  Plan: -pt readmitted last evening with fever and most likely infected bypass graft.   I had spoken to pt's wife yesterday and offered to see him yesterday and today in the office but she could not bring him in.    -Pt continued to have fevers overnight of 100.8 and this am 100.6.  Pt on IV abx.  Blood cx x 2 no growth so far.  U/a negative.  -pt npo and posted for OR today, however, pt is very belligerent this am to me and MD.   Dr. Myra Gianotti will return to see pt a  little later this am     Doreatha Massed, PA-C [facility] [phone] 03/02/2020 7:47 AM

## 2020-03-02 NOTE — Op Note (Signed)
    NAME: [name]    MRN: [id number] DOB: [birth year]    DATE OF OPERATION: 03/02/2020  PREOP DIAGNOSIS:    Infection of below the knee incision right leg  POSTOP DIAGNOSIS:    Same  PROCEDURE:    Exploration of wound right leg Placement of antibiotic beads  SURGEON: Di Kindle. Edilia Bo, MD  ASSIST: None  ANESTHESIA: General  EBL: Minimal  INDICATIONS:    [name] is a 69 y.o. male who is undergone a femoropopliteal bypass with a prosthetic graft.  He developed drainage from the lower incision.  Washout and exploration were recommended.  FINDINGS:   The the fluid from the wound was sent for culture.  There was not a lot of purulent fluid noted.  I did not see exposed graft.  The wound was washed out and antibiotic beads were placed.  TECHNIQUE:   The patient was taken to the operating room and received a general anesthetic.  The right leg was prepped and draped in usual sterile fashion.  The previous incision below the knee was opened and the dissection carried down into the fluid collection.  A culture was sent.  The wound was irrigated with copious amounts of saline.  Hemostasis was obtained using electrocautery.  I explored the depth of the wound and there was really no exposed graft.  Antibiotic beads which had been soaked in vancomycin and Zosyn were placed in the wound and the wound was then packed with moist 4 x 4's.  This was then covered with 4 x 4's, Kerlix, and a 4 inch Ace.  Patient tolerated procedure well was transferred recovery room in stable condition.  All needle and sponge counts were correct.  [name], MD, FACS [facility]  DATE OF DICTATION:   03/02/2020

## 2020-03-02 NOTE — Interval H&P Note (Signed)
History and Physical Interval Note:  03/02/2020 11:17 AM  Lonnie [name]  has presented today for surgery, with the diagnosis of Abcess after fem-pop bypass.  The various methods of treatment have been discussed with the patient and family. After consideration of risks, benefits and other options for treatment, the patient has consented to  Procedure(s): IRRIGATION AND DEBRIDEMENT EXTREMITY right leg (Right) as a surgical intervention.  The patient's history has been reviewed, patient examined, no change in status, stable for surgery.  I have reviewed the patient's chart and labs.  Questions were answered to the patient's satisfaction.     [name]

## 2020-03-02 NOTE — Progress Notes (Signed)
I tried to see the patient this am. He yelled at me and told me not to talk to him.  He would not let me examen him.  He has been that way with evryone that has entered his room.  This is very similar to his attitude after surgery.  Iwill trry and re-evaluate him later today  [name]

## 2020-03-02 NOTE — Anesthesia Preprocedure Evaluation (Signed)
Anesthesia Evaluation  Patient identified by MRN, date of birth, ID band Patient awake    Reviewed: Allergy & Precautions, NPO status   Airway Mallampati: II  TM Distance: >3 FB     Dental   Pulmonary Current Smoker and Patient abstained from smoking.,    breath sounds clear to auscultation       Cardiovascular hypertension, + CAD, + Past MI and + Peripheral Vascular Disease   Rhythm:Regular Rate:Normal     Neuro/Psych    GI/Hepatic negative GI ROS, Neg liver ROS,   Endo/Other  negative endocrine ROS  Renal/GU negative Renal ROS     Musculoskeletal   Abdominal   Peds  Hematology   Anesthesia Other Findings   Reproductive/Obstetrics                             Anesthesia Physical Anesthesia Plan  ASA: III  Anesthesia Plan: General   Post-op Pain Management:    Induction: Intravenous  PONV Risk Score and Plan: 1 and Ondansetron and Dexamethasone  Airway Management Planned: Oral ETT  Additional Equipment:   Intra-op Plan:   Post-operative Plan: Extubation in OR  Informed Consent: I have reviewed the patients History and Physical, chart, labs and discussed the procedure including the risks, benefits and alternatives for the proposed anesthesia with the patient or authorized representative who has indicated his/her understanding and acceptance.     Dental advisory given  Plan Discussed with: CRNA and Anesthesiologist  Anesthesia Plan Comments:         Anesthesia Quick Evaluation

## 2020-03-02 NOTE — H&P (View-Only) (Signed)
I tried to see the patient this am. He yelled at me and told me not to talk to him.  He would not let me examen him.  He has been that way with evryone that has entered his room.  This is very similar to his attitude after surgery.  Iwill trry and re-evaluate him later today  Wells Dameir Gentzler 

## 2020-03-02 NOTE — Progress Notes (Signed)
Patient received via stretcher from E.R. alert and not wanting to stay. Emotional support given . R.N. in room and did assess. Patient was orin. To room. No complaints voiced

## 2020-03-03 NOTE — Progress Notes (Addendum)
[facility] [place]  Subjective  - No new complaints.   Objective (!) 149/85 74 98.4 F (36.9 C) (Oral) 14 100%  Intake/Output Summary (Last 24 hours) at 03/03/2020 0903 Last data filed at 03/03/2020 0504 Gross per 24 hour  Intake 860 ml  Output 1965 ml  Net -1105 ml    Right BK incision with clean dry dress in place. Feet warm with intact motor. Brisk doppler signals right DP/PT Lungs non labored breathing  Assessment/Planning: S/P femoropopliteal bypass with a prosthetic graft.  He developed drainage from the lower incision  POD # 1  Exploration of wound right leg Placement of antibiotic beads  He remains afebrile with WBC 9.1.  Pending cultures.  No growth to date. Plan to change the dressing tomorrow.  Mosetta Pigeon 03/03/2020 9:03 AM --  Laboratory Lab Results: Recent Labs    03/01/20 2132  WBC 9.1  HGB 12.3*  HCT 39.2  PLT 275   BMET Recent Labs    03/01/20 2132  NA 139  K 4.1  CL 106  CO2 22  GLUCOSE 115*  BUN 15  CREATININE 1.14  CALCIUM 9.5    COAG Lab Results  Component Value Date   INR 1.0 03/01/2020   INR 0.9 01/20/2020   INR 1.0 06/30/2019   No results found for: PTT  I agree with the above.  The patient remains relatively uncooperative.  We will plan for dressing changes tomorrow if he allows [place].  Continue IV antibiotics and follow-up cultures  [name]

## 2020-03-03 NOTE — Progress Notes (Signed)
Patient very confused,agitated, talking loud and pulled I.V. out, setting on side of bed with cloths off and urinating on floor, Tele. Off. Swung at charge nurse. . Dr. Myra Gianotti notified of the above see orders for Ativan 2 mg I.V. q 6 hrs prn agitation.  Ativan 2 mg I.V. given and Nicotine patch applied per orders.  Patient is getting more relax and cooperative. Cont. To monitor patient.

## 2020-03-04 ENCOUNTER — Other Ambulatory Visit: Payer: Self-pay | Admitting: Internal Medicine

## 2020-03-04 LAB — URINE CULTURE: Culture: 10000 — AB

## 2020-03-04 LAB — BASIC METABOLIC PANEL
Anion gap: 12 (ref 5–15)
BUN: 9 mg/dL (ref 8–23)
CO2: 22 mmol/L (ref 22–32)
Calcium: 9.1 mg/dL (ref 8.9–10.3)
Chloride: 104 mmol/L (ref 98–111)
Creatinine, Ser: 1.05 mg/dL (ref 0.61–1.24)
GFR calc Af Amer: >60 mL/min (ref >60)
GFR calc non Af Amer: >60 mL/min (ref >60)
Glucose, Bld: 100 mg/dL — ABNORMAL HIGH (ref 70–99)
Potassium: 3.9 mmol/L (ref 3.5–5.1)
Sodium: 138 mmol/L (ref 135–145)

## 2020-03-04 MED ORDER — SODIUM CHLORIDE 0.9 % IV SOLN
2.0000 g | Freq: Three times a day (TID) | INTRAVENOUS | Status: DC
  Administered 2020-03-04 – 2020-03-06 (×6): 2 g via INTRAVENOUS
  Filled 2020-03-04 (×10): qty 2

## 2020-03-04 NOTE — Progress Notes (Addendum)
[facility] [place]  Subjective  - Pleasant this am.   Objective 126/79 (!) 51 98.9 F (37.2 C) (Oral) 20 98%  Intake/Output Summary (Last 24 hours) at 03/04/2020 0818 Last data filed at 03/04/2020 0700 Gross per 24 hour  Intake 480 ml  Output 2400 ml  Net -1920 ml   Brisk doppler signals right DP/PT Right medial calf dressing changed no evidence of purulent drainage.  Muscle appears viable, surrounding tissue without erythema.  Minimal edema. Wet to dry dressing replaced Lungs non labored breathing     Assessment/Planning: S/P femoropopliteal bypass with a prosthetic graft. He developed drainage from the lower incision  He remains afebrile I have ordered a CBC for am tomorrow.  Culture: RARE PSEUDOMONAS AERUGINOSA  SUSCEPTIBILITIES TO FOLLOW  He is currently on Vancomycin and oral Keflex.  Mosetta Pigeon 03/04/2020 8:18 AM --  Laboratory Lab Results: Recent Labs    03/01/20 2132  WBC 9.1  HGB 12.3*  HCT 39.2  PLT 275   BMET Recent Labs    03/01/20 2132 03/04/20 0352  NA 139 138  K 4.1 3.9  CL 106 104  CO2 22 22  GLUCOSE 115* 100*  BUN 15 9  CREATININE 1.14 1.05  CALCIUM 9.5 9.1    COAG Lab Results  Component Value Date   INR 1.0 03/01/2020   INR 0.9 01/20/2020   INR 1.0 06/30/2019   No results found for: PTT  Much more pleasant today.  Dressing changed.  Cx grew pseudomonas.  I discussed with pharmacy changing his abx.  [name]

## 2020-03-04 NOTE — Progress Notes (Signed)
Social work consult request: Patient's spouse request social work consult to discuss options for discharge.  She states the patient is becoming difficult to handle, especially falling down all the time.   She states its only the both of them and she has a heart condition.  Spouse is hoping for rehab to get stronger

## 2020-03-04 NOTE — Progress Notes (Signed)
Pharmacy Antibiotic Note  Lonnie [name] is a 69 y.o. male admitted on 03/01/2020 with surgical bypass infection. Patient is s/p exploration of right leg wound with placement of abx beads on 3/12. Wound culture now growing pseudomonas. Pharmacy has been consulted to stop Vancomycin and to start Cefepime. Last WBC on 3/11 wnl and pt has been afebrile. Scr stable at 1.05.  Plan: Stop Vancomycin Start Cefepime 2g IV Q8 hrs Monitor cultures/sensitivities, renal function, and clinical progression F/u LOT  Height: 6\' 2"  (188 cm) Weight: 190 lb 14.7 oz (86.6 kg) IBW/kg (Calculated) : 82.2  Temp (24hrs), Avg:98.5 F (36.9 C), Min:97.5 F (36.4 C), Max:99.2 F (37.3 C)  Recent Labs  Lab 03/01/20 2132 03/02/20 0004 03/04/20 0352  WBC 9.1  --   --   CREATININE 1.14  --  1.05  LATICACIDVEN 2.8* 1.6  --     Estimated Creatinine Clearance: 80.5 mL/min (by C-G formula based on SCr of 1.05 mg/dL).    Allergies  Allergen Reactions  . Ace Inhibitors Swelling    Angioedema  . Dairy Aid [Lactase]     gas  . Eggs Or Egg-Derived Products     Cannot eat Prepared Eggs  . Latex Itching    Antimicrobials this admission: Zosyn 3/11 >> 3/12 Vancomycin 3/11 >> 3/14 Keflex 3/12 >> 3/14 Cefepime 3/14 >>  Dose adjustments this admission: N/A  Microbiology results: 3/12 Ucx: staph epi (likely contaminant, UA neg) 3/12 wound gram stain: neg 3/12 wound Cx: rare pseudomonas 3/11 Bcx: ngtd 3/11 Resp panel: covid/flu neg   5/11, PharmD PGY1 Pharmacy Resident Phone: [phone] 03/04/2020  9:35 AM  Please check [email or web address] for unit-specific pharmacy phone numbers.

## 2020-03-05 LAB — CBC
HCT: 35.4 % — ABNORMAL LOW (ref 39.0–52.0)
Hemoglobin: 11.3 g/dL — ABNORMAL LOW (ref 13.0–17.0)
MCH: 27.2 pg (ref 26.0–34.0)
MCHC: 31.9 g/dL (ref 30.0–36.0)
MCV: 85.1 fL (ref 80.0–100.0)
Platelets: 262 10*3/uL (ref 150–400)
RBC: 4.16 MIL/uL — ABNORMAL LOW (ref 4.22–5.81)
RDW: 12.9 % (ref 11.5–15.5)
WBC: 7.3 10*3/uL (ref 4.0–10.5)
nRBC: 0 % (ref 0.0–0.2)

## 2020-03-05 MED FILL — Gentamicin Sulfate Inj 40 MG/ML: INTRAMUSCULAR | Qty: 2 | Status: AC

## 2020-03-05 MED FILL — Vancomycin HCl For IV Soln 1 GM (Base Equivalent): INTRAVENOUS | Qty: 1000 | Status: AC

## 2020-03-05 NOTE — Progress Notes (Signed)
Patient still has medium size pump on right upper back now with white center. M.D. please address.

## 2020-03-05 NOTE — Progress Notes (Addendum)
  Progress Note    03/05/2020 8:49 AM 3 Days Post-Op  Subjective:  Says he is freezing. Not complaining of pain. Says he is ready to go today and repeatedly saying he will not come back here ever again   Vitals:   03/04/20 2000 03/05/20 0307  BP:  (!) 103/56  Pulse:    Resp: 12 11  Temp:  98.1 F (36.7 C)  SpO2:  98%   Physical Exam: General: not in any distress, appears comfortable Lungs:  Non labored Incisions:  Right thigh incisions clean, dry, intact. Right medial popliteal incision without purulence. Pink clean tissue in wound bed. No drainage on compression. Wet to dry dressings replaced. Extremities:  Adequate perfusion of bilateral lower extremities. Warm feet. Palpable PT Neurologic: Alert and oriented  CBC    Component Value Date/Time   WBC 7.3 03/05/2020 0301   RBC 4.16 (L) 03/05/2020 0301   HGB 11.3 (L) 03/05/2020 0301   HGB 15.7 01/10/2020 1146   HCT 35.4 (L) 03/05/2020 0301   HCT 46.1 01/10/2020 1146   PLT 262 03/05/2020 0301   PLT 232 01/10/2020 1146   MCV 85.1 03/05/2020 0301   MCV 83 01/10/2020 1146   MCH 27.2 03/05/2020 0301   MCHC 31.9 03/05/2020 0301   RDW 12.9 03/05/2020 0301   RDW 12.8 01/10/2020 1146   LYMPHSABS 0.9 03/01/2020 2132   LYMPHSABS 2.5 03/01/2015 1414   MONOABS 0.5 03/01/2020 2132   EOSABS 0.1 03/01/2020 2132   EOSABS 0.4 03/01/2015 1414   BASOSABS 0.1 03/01/2020 2132   BASOSABS 0.1 03/01/2015 1414    BMET    Component Value Date/Time   NA 138 03/04/2020 0352   NA 142 01/10/2020 1146   K 3.9 03/04/2020 0352   CL 104 03/04/2020 0352   CO2 22 03/04/2020 0352   GLUCOSE 100 (H) 03/04/2020 0352   BUN 9 03/04/2020 0352   BUN 19 01/10/2020 1146   CREATININE 1.05 03/04/2020 0352   CALCIUM 9.1 03/04/2020 0352   GFRNONAA >60 03/04/2020 0352   GFRAA >60 03/04/2020 0352    INR    Component Value Date/Time   INR 1.0 03/01/2020 2132     Intake/Output Summary (Last 24 hours) at 03/05/2020 0849 Last data filed at 03/05/2020  0643 Gross per 24 hour  Intake 880 ml  Output 1350 ml  Net -470 ml     Assessment/Plan:  69 y.o. male is s/p exploration of wound right leg and placement of antibiotics beads due to drainage from lower incision of femoropopliteal bypass graft 3 Days Post-Op. Pseudomonas grew in culture sensitive to Cefepime. Cefepime per pharmacy. Remains afebrile. No leukocytosis. Continue wound observation and wet- to dry dressings  DVT prophylaxis: Lovenox   Graceann Congress, PA-C [facility] [phone] 03/05/2020 8:49 AM   I agree with the above.  Continue dressing changes and monitor cultures to adjust abx  [name]

## 2020-03-05 NOTE — Progress Notes (Signed)
MOBILITY TEAM - Progress Note   03/05/20 1000  Mobility  Activity Ambulated in hall  Level of Assistance Contact guard assist, steadying assist  Assistive Device Front wheel walker  Distance Ambulated (ft) 180 ft  Mobility Response Tolerated well  Bed Position Semi-fowlers   Patient pleasant and agreeable to participate; reports he hopes to be home by tomorrow.  Ina Homes, PT, DPT Mobility Team Pager [phone]

## 2020-03-05 NOTE — Progress Notes (Signed)
I.V. site painful, redden , sm. Knot at site.  D.C. I.V. Unable to find good vain to get I.V. in. He refused to let 2nd R.N. to stick him. I.V. team called .

## 2020-03-05 NOTE — TOC Initial Note (Signed)
Transition of Care [facility]) - Initial/Assessment Note    Patient Details  Name: Lonnie [name] MRN: [id number] Date of Birth: 05-06-1951  Transition of Care [facility]) CM/SW Contact:    Eduard Roux, LCSWA Phone Number: 03/05/2020, 2:19 PM  Clinical Narrative:          CSW spoke with patient's spouse and daughter by phone. CSW introduced self and explained role. CSW discussed the possibility of ST rehab at [facility]. Patient's spouse states she and the patient are the only ones on the home. They have the support of their daughter,Lonnie [name] that comes by to assist them. They have [facility] services that comes to their home 2x per week. Patient's family states they would like for the patient to go  SNF for ST rehab  but knows he will not agree to going. Family is agreeable to the patient returning home with [facility], preference for [facility]. Family states no DME needs. Patient's spouse inquired about receiving more services for the patient from [facility]. CSW advised the family to contact the agency and discuss their additional needs and what will be covered by their insurance plan or out of pocket expense. CSW inquired about applying for medicaid and was informed by patient's spouse "they wouldn't qualify". CSW encourage the family to discuss and explore long term care plans.   Antony Blackbird, MSW, LCSWA Clinical Social Worker    Expected Discharge Plan: Home w Home Health Services     Patient Goals and CMS Choice        Expected Discharge Plan and Services Expected Discharge Plan: Home w Home Health Services                                              Prior Living Arrangements/Services   Lives with:: Self, Spouse Patient language and need for interpreter reviewed:: No Do you feel safe going back to the place where you live?: Yes      Need for Family Participation in Patient Care: Yes (Comment) Care giver support system in place?: Yes (comment)    Criminal Activity/Legal Involvement Pertinent to Current Situation/Hospitalization: No - Comment as needed  Activities of Daily Living      Permission Sought/Granted Permission sought to share information with : Family Supports Permission granted to share information with : Yes, Verbal Permission Granted  Share Information with NAME: Lonnie [name]  Permission granted to share info w AGENCY: HH  Permission granted to share info w Relationship: spouse  Permission granted to share info w Contact Information: [phone]  Emotional Assessment       Orientation: : Oriented to Self, Oriented to Place, Oriented to  Time, Oriented to Situation Alcohol / Substance Use: Not Applicable Psych Involvement: No (comment)  Admission diagnosis:  PAD (peripheral artery disease) (HCC) [I73.9] Cellulitis of right lower extremity [L03.115] Fever, unspecified fever cause [R50.9] Postoperative surgical complication involving circulatory system associated with circulatory procedure, unspecified complication [I97.89] Patient Active Problem List   Diagnosis Date Noted  . PAD (peripheral artery disease) (HCC) 01/20/2020  . Fronto-temporal dementia (HCC) 08/04/2019  . Left shoulder pain   . TBI (traumatic brain injury) (HCC)   . Urinary retention   . Postoperative pain   . Agitation   . SDH (subdural hematoma) (HCC) 07/08/2019  . Subdural hematoma (HCC) 06/30/2019  . Erectile dysfunction 07/28/2018  . Peripheral  vascular disease ([place]) 07/16/2017  . Anxiety and depression 07/16/2017  . PCP NOTES >>>>>>>>>>>> 07/08/2016  . Dermatitis 04/19/2015  . Elevated LFTs 02/12/2015  . Hyperglycemia 09/01/2014  . DJD (degenerative joint disease) 09/01/2014  . Annual physical exam 09/01/2014  . S/P CABG x 4 11/07/2013  . HTN (hypertension) 10/29/2013  . Tobacco abuse 10/29/2013  . Coronary atherosclerosis of native coronary artery 10/29/2013   PCP:  Colon Branch, MD Pharmacy:   [id number] -  [place], [place] [address] Phone: [phone] Fax: [phone]     Social Determinants of Health (SDOH) Interventions    Readmission Risk Interventions No flowsheet data found.

## 2020-03-05 NOTE — Progress Notes (Signed)
I.V. site redden tender to touch with small knot . I.V. D.C. Patient is refusing I.V. restart. Have tried x 3 . But still refusing.

## 2020-03-06 LAB — CULTURE, BLOOD (ROUTINE X 2)
Culture: NO GROWTH
Culture: NO GROWTH

## 2020-03-06 MED ORDER — SODIUM CHLORIDE 0.9 % IV SOLN
2.0000 g | Freq: Three times a day (TID) | INTRAVENOUS | Status: DC
  Administered 2020-03-07 – 2020-03-09 (×7): 2 g via INTRAVENOUS
  Filled 2020-03-06 (×9): qty 2

## 2020-03-06 NOTE — Plan of Care (Signed)
Continue to monitor

## 2020-03-06 NOTE — Care Management Important Message (Signed)
Important Message  Patient Details  Name: Lonnie [name] MRN: [id number] Date of Birth: [birth year]   Medicare Important Message Given:  Yes     Renie Ora 03/06/2020, 1:48 PM

## 2020-03-06 NOTE — Progress Notes (Addendum)
IV access obtained by IV team. In patient's room to administer antibiotic at this time and patient has removed IV. Patient states, "I don't like needles". Refused another IV at this time. Doreatha Massed, PA notified. Will notify IV team for another access if possible.

## 2020-03-06 NOTE — Progress Notes (Addendum)
  Progress Note    03/06/2020 7:29 AM 4 Days Post-Op  Subjective:  Very upset this morning and wants to know when he is going home.  Tm 100.2 this am at 0500 HR 50's-80's  120's-150's systolic 97% RA  Vitals:   03/05/20 1945 03/06/20 0600  BP: (!) 154/81 125/78  Pulse:  79  Resp: 18 14  Temp: 98.1 F (36.7 C) 100.2 F (37.9 C)  SpO2: 100% 98%    Physical Exam: [place]:  No distress Lungs:  Non labored Incisions:  Right leg with bandage in place Extremities:  Right foot is warm and well perfused Back:  Area of erythema right upper back that looks like a boil.   CBC    Component Value Date/Time   WBC 7.3 03/05/2020 0301   RBC 4.16 (L) 03/05/2020 0301   HGB 11.3 (L) 03/05/2020 0301   HGB 15.7 01/10/2020 1146   HCT 35.4 (L) 03/05/2020 0301   HCT 46.1 01/10/2020 1146   PLT 262 03/05/2020 0301   PLT 232 01/10/2020 1146   MCV 85.1 03/05/2020 0301   MCV 83 01/10/2020 1146   MCH 27.2 03/05/2020 0301   MCHC 31.9 03/05/2020 0301   RDW 12.9 03/05/2020 0301   RDW 12.8 01/10/2020 1146   LYMPHSABS 0.9 03/01/2020 2132   LYMPHSABS 2.5 03/01/2015 1414   MONOABS 0.5 03/01/2020 2132   EOSABS 0.1 03/01/2020 2132   EOSABS 0.4 03/01/2015 1414   BASOSABS 0.1 03/01/2020 2132   BASOSABS 0.1 03/01/2015 1414    BMET    Component Value Date/Time   NA 138 03/04/2020 0352   NA 142 01/10/2020 1146   K 3.9 03/04/2020 0352   CL 104 03/04/2020 0352   CO2 22 03/04/2020 0352   GLUCOSE 100 (H) 03/04/2020 0352   BUN 9 03/04/2020 0352   BUN 19 01/10/2020 1146   CREATININE 1.05 03/04/2020 0352   CALCIUM 9.1 03/04/2020 0352   GFRNONAA >60 03/04/2020 0352   GFRAA >60 03/04/2020 0352    INR    Component Value Date/Time   INR 1.0 03/01/2020 2132     Intake/Output Summary (Last 24 hours) at 03/06/2020 0729 Last data filed at 03/06/2020 6759 Gross per 24 hour  Intake 1200 ml  Output 1375 ml  Net -175 ml     Assessment:  69 y.o. male is s/p:  exploration of wound right leg  and placement of antibiotics beads due to drainage from lower incision of femoropopliteal bypass graft 3   4 Days Post-Op  Plan: -pt right foot warm and well perfused -pt's IV removed last night due to some redness and unable to get new IV access and pt refusing -dressing changed by RN this am  -cx grew pseudomonas and sensitive to Cefepime, which pharmacy is dosing. No leukocytosis but did have low grade fever this am -DVT prophylaxis:  Lovenox -boil right upper back-no drainage noted but is erythematous  -family would like MD to call them today for update.   Doreatha Massed, PA-C [facility] [phone] 03/06/2020 7:29 AM  I agree with the above.  I have seen and examined the patient.  He would not let me examine him today and was verbally abusive towards me and as a result I was unable to examen him.  I spoke with his daughter and updated about evrythng, including his angry outbursts.  [name]

## 2020-03-06 NOTE — Progress Notes (Signed)
MOBILITY TEAM - Progress Note   03/06/20 1000  Mobility  Activity Ambulated in hall  Level of Assistance Standby assist, set-up cues, supervision of patient - no hands on  Assistive Device Front wheel walker  Distance Ambulated (ft) 400 ft  Mobility Response Tolerated well  Bed Position Semi-fowlers   During activity: HR 99 Post-activity: HR 78  Patient pleasant and agreeable to participate. C/o back pain. Reports "I am leaving today."  Ina Homes, PT, DPT Mobility Team Pager [phone]

## 2020-03-07 LAB — AEROBIC/ANAEROBIC CULTURE W GRAM STAIN (SURGICAL/DEEP WOUND)

## 2020-03-07 NOTE — Progress Notes (Addendum)
Pt is very ill and agitated wanting to go home at this time. Attempted to swing while putting bp cuff on. Will reorient and continue to monitor.  Judithann Sheen, RN

## 2020-03-07 NOTE — Progress Notes (Signed)
Pharmacy Antibiotic Note  Lonnie [name] is a 69 y.o. male admitted on 03/01/2020 with surgical bypass infection. Patient is s/p exploration of right leg wound with placement of abx beads on 3/12. Wound culture now growing pseudomonas (pan-sensitive). Pharmacy has been consulted to dose Cefepime. Plans noted for possible cipro at discharge -SCr= 1.05 on 3/14  Plan: -Continue Cefepime 2g IV Q8 hrs -If changing to cipro, can consider cipro 500mg  po bid -Will follow renal function and clinical progress    Height: 6\' 2"  (188 cm) Weight: 190 lb 14.7 oz (86.6 kg) IBW/kg (Calculated) : 82.2  Temp (24hrs), Avg:98.4 F (36.9 C), Min:98 F (36.7 C), Max:98.9 F (37.2 C)  Recent Labs  Lab 03/01/20 2132 03/02/20 0004 03/04/20 0352 03/05/20 0301  WBC 9.1  --   --  7.3  CREATININE 1.14  --  1.05  --   LATICACIDVEN 2.8* 1.6  --   --     Estimated Creatinine Clearance: 80.5 mL/min (by C-G formula based on SCr of 1.05 mg/dL).    Allergies  Allergen Reactions  . Ace Inhibitors Swelling    Angioedema  . Dairy Aid [Lactase]     gas  . Eggs Or Egg-Derived Products     Cannot eat Prepared Eggs  . Latex Itching    Antimicrobials this admission: Zosyn 3/11 >> 3/12 Vancomycin 3/11 >> 3/14 Keflex 3/12 >> 3/14 Cefepime 3/14 >>  Dose adjustments this admission: N/A  Microbiology results: 3/12 Ucx: staph epi (likely contaminant, UA neg) 3/12 wound gram stain: neg 3/12 wound Cx: rare pseudomonas 3/11 Bcx: neg 3/11 Resp panel: covid/flu neg  5/11, PharmD Clinical Pharmacist **Pharmacist phone directory can now be found on [email or web address] ([email or web address]).  Listed under [facility].

## 2020-03-07 NOTE — TOC Initial Note (Addendum)
Transition of Care [facility]) - Initial/Assessment Note    Patient Details  Name: Lonnie [name] MRN: [id number] Date of Birth: 1951/06/12  [facility] CM/SW Contact:    Carles Collet, RN Phone Number: 03/07/2020, 12:31 PM  Clinical Narrative:          Patient is active w [facility] for RN PT, verified w Butch Penny Notified that patient may need [facility] VAC. Orders sent to [facility], requested PA to document measurements. Notified Durene [facility] that he may need a VAC at DC. Please notify Butch Penny of days of dressing changes if it is determined that he will need a VAC for DC.  *UPDATE- VAC will be delivered in the morning by noon. MD/PA please include in [facility] RN order the days you need the VAC changed.           Expected Discharge Plan: [place]     Patient Goals and CMS Choice        Expected Discharge Plan and Services Expected Discharge Plan: [place]                         DME Arranged: Vac DME Agency: [employer] Date DME Agency Contacted: 03/07/20 Time DME Agency Contacted: 0900 Representative spoke with at DME Agency: Leana Roe HH Arranged: RN, PT [place] Agency: [place] ([place]) Date [place]: 03/07/20 Time [place]: 42 Representative spoke with at [place]: Butch Penny  Prior Living Arrangements/Services   Lives with:: Self, Spouse Patient language and need for interpreter reviewed:: No Do you feel safe going back to the place where you live?: Yes      Need for Family Participation in Patient Care: Yes (Comment) Care giver support system in place?: Yes (comment)   Criminal Activity/Legal Involvement Pertinent to Current Situation/Hospitalization: No - Comment as needed  Activities of Daily Living Home Assistive Devices/Equipment: Crutches ADL Screening (condition at time of admission) Patient's cognitive ability adequate to safely complete daily activities?: Yes Is the patient deaf or have difficulty  hearing?: No Does the patient have difficulty seeing, even when wearing glasses/contacts?: No Does the patient have difficulty concentrating, remembering, or making decisions?: No Patient able to express need for assistance with ADLs?: Yes Does the patient have difficulty dressing or bathing?: No Independently performs ADLs?: Yes (appropriate for developmental age) Does the patient have difficulty walking or climbing stairs?: Yes Weakness of Legs: Right Weakness of Arms/Hands: None  Permission Sought/Granted Permission sought to share information with : Family Supports Permission granted to share information with : Yes, Verbal Permission Granted  Share Information with NAME: Lonnie [name]  Permission granted to share info w AGENCY: [place]  Permission granted to share info w Relationship: spouse  Permission granted to share info w Contact Information: [phone]  Emotional Assessment       Orientation: : Oriented to Self, Oriented to Place, Oriented to  Time, Oriented to Situation Alcohol / Substance Use: Not Applicable Psych Involvement: No (comment)  Admission diagnosis:  PAD (peripheral artery disease) ([place]) [I73.9] Cellulitis of right lower extremity [L03.115] Fever, unspecified fever cause [R50.9] Postoperative surgical complication involving circulatory system associated with circulatory procedure, unspecified complication [P71.06] Patient Active Problem List   Diagnosis Date Noted  . PAD (peripheral artery disease) ([place]) 01/20/2020  . Fronto-temporal dementia ([place]) 08/04/2019  . Left shoulder pain   . TBI (traumatic brain injury) ([place])   . Urinary retention   . Postoperative pain   .  Agitation   . SDH (subdural hematoma) (H[facility]) 07/08/2019  . Subdural hematoma (H[facility]) 06/30/2019  . Erectile dysfunction 07/28/2018  . Peripheral vascular disease ([facility]) 07/16/2017  . Anxiety and depression 07/16/2017  . PCP NOTES >>>>>>>>>>>> 07/08/2016  . Dermatitis 04/19/2015  .  Elevated LFTs 02/12/2015  . Hyperglycemia 09/01/2014  . DJD (degenerative joint disease) 09/01/2014  . Annual physical exam 09/01/2014  . S/P CABG x 4 11/07/2013  . HTN (hypertension) 10/29/2013  . Tobacco abuse 10/29/2013  . Coronary atherosclerosis of native coronary artery 10/29/2013   PCP:  [name], MD Pharmacy:   [id number] [place], [place] - [address] [place] [address] Phone: [phone] Fax: [phone]     Social Determinants of Health (SDOH) Interventions    Readmission Risk Interventions Readmission Risk Prevention Plan 03/05/2020  Transportation Screening Complete  PCP or Specialist Appt within 3-5 Days Complete  HRI or Home Care Consult Complete  Social Work Consult for Recovery Care Planning/Counseling Complete  Palliative Care Screening Not Applicable  Some recent data might be hidden

## 2020-03-07 NOTE — Progress Notes (Signed)
   03/07/20 1230  Mobility  Activity Ambulated in hall  Range of Motion/Exercises Active;All extremities  Level of Assistance Standby assist, set-up cues, supervision of patient - no hands on  Assistive Device Four wheel walker  Minutes Ambulated 10 minutes  Distance Ambulated (ft) 480 ft  Mobility Response Tolerated well  Mobility performed by Other (comment)  Bed Position High-fowlers (back to bed with alarm on for safety)

## 2020-03-07 NOTE — Progress Notes (Addendum)
  Progress Note    03/07/2020 7:50 AM 5 Days Post-Op  Subjective:  Pleasant this morning.  Wants IV out b/c he says it hurts about a 2/10.  Wants to go home.   Afebrile HR 50's-100's SB/NSR 110's-150's systolic 99% RA  Vitals:   03/06/20 1935 03/07/20 0551  BP: (!) 156/84 118/69  Pulse: (!) 59 60  Resp: 17 15  Temp: 98.1 F (36.7 C) 98.4 F (36.9 C)  SpO2: 100% 99%    Physical Exam: Cardiac:  regular Lungs:  Non labored Incisions:    Wound measures ~ 10 x 4 x 5 cm.    Extremities:  Right foot warm and faintly palpable right PT pulse   CBC    Component Value Date/Time   WBC 7.3 03/05/2020 0301   RBC 4.16 (L) 03/05/2020 0301   HGB 11.3 (L) 03/05/2020 0301   HGB 15.7 01/10/2020 1146   HCT 35.4 (L) 03/05/2020 0301   HCT 46.1 01/10/2020 1146   PLT 262 03/05/2020 0301   PLT 232 01/10/2020 1146   MCV 85.1 03/05/2020 0301   MCV 83 01/10/2020 1146   MCH 27.2 03/05/2020 0301   MCHC 31.9 03/05/2020 0301   RDW 12.9 03/05/2020 0301   RDW 12.8 01/10/2020 1146   LYMPHSABS 0.9 03/01/2020 2132   LYMPHSABS 2.5 03/01/2015 1414   MONOABS 0.5 03/01/2020 2132   EOSABS 0.1 03/01/2020 2132   EOSABS 0.4 03/01/2015 1414   BASOSABS 0.1 03/01/2020 2132   BASOSABS 0.1 03/01/2015 1414    BMET    Component Value Date/Time   NA 138 03/04/2020 0352   NA 142 01/10/2020 1146   K 3.9 03/04/2020 0352   CL 104 03/04/2020 0352   CO2 22 03/04/2020 0352   GLUCOSE 100 (H) 03/04/2020 0352   BUN 9 03/04/2020 0352   BUN 19 01/10/2020 1146   CREATININE 1.05 03/04/2020 0352   CALCIUM 9.1 03/04/2020 0352   GFRNONAA >60 03/04/2020 0352   GFRAA >60 03/04/2020 0352    INR    Component Value Date/Time   INR 1.0 03/01/2020 2132     Intake/Output Summary (Last 24 hours) at 03/07/2020 0750 Last data filed at 03/07/2020 0616 Gross per 24 hour  Intake 360 ml  Output 1350 ml  Net -990 ml     Assessment:  69 y.o. male is s/p:  exploration of wound right leg and placement of  antibiotics beads due to drainage from lower incision of femoropopliteal bypass graft  5 Days Post-Op  Plan: -pt wound is clean-see above picture -pt pulled IV out yesterday and they were able to get one back in.  Continue IV abx.  -DVT prophylaxis: Lovenox -pt was on Klonopin at home bid prn and was not restarted-will d/w Dr. Myra Gianotti about restarting this. -sensitivites are back for wound and is sensitive to Cipro.  Hopefully can d/c home soon on po abx.  Will ask case management to be involved for home health needs as he will need some sort of wound care at home whether it is wet to dry dressings or wound vac.   -face to face order and HH orders placed.    Doreatha Massed, PA-C [facility] [phone] 03/07/2020 7:50 AM

## 2020-03-08 LAB — CREATININE, SERUM
Creatinine, Ser: 0.99 mg/dL (ref 0.61–1.24)
GFR calc Af Amer: >60 mL/min (ref >60)
GFR calc non Af Amer: >60 mL/min (ref >60)

## 2020-03-08 NOTE — Progress Notes (Signed)
MOBILITY TEAM - Progress Note   03/08/20 1400  Mobility  Activity Ambulated in hall  Level of Assistance Standby assist, set-up cues, supervision of patient - no hands on  Assistive Device Front wheel walker  Distance Ambulated (ft) 350 ft  Mobility Response Tolerated well  Mobility performed by Mobility specialist   Pleasant and agreeable to second bout of hallway ambulation. Expresses frustration with prolonged hospital admission.  Ina Homes, PT, DPT Mobility Team Pager [phone]

## 2020-03-08 NOTE — Progress Notes (Addendum)
  Progress Note    03/08/2020 7:10 AM 6 Days Post-Op  Subjective:  Awakes from sleep and is pleasant without complaints.   Afebrile HR 50's-90's NSR 110's-130's systolic 100% RA  Vitals:   03/08/20 0115 03/08/20 0500  BP:  130/88  Pulse:  79  Resp: 12 12  Temp:  97.6 F (36.4 C)  SpO2:  100%    Physical Exam: General:  No distress Lungs:  Non labored Incisions:  Bandage is clean-this was not removed this am. Extremities:  Right foot warm and well perfused.   CBC    Component Value Date/Time   WBC 7.3 03/05/2020 0301   RBC 4.16 (L) 03/05/2020 0301   HGB 11.3 (L) 03/05/2020 0301   HGB 15.7 01/10/2020 1146   HCT 35.4 (L) 03/05/2020 0301   HCT 46.1 01/10/2020 1146   PLT 262 03/05/2020 0301   PLT 232 01/10/2020 1146   MCV 85.1 03/05/2020 0301   MCV 83 01/10/2020 1146   MCH 27.2 03/05/2020 0301   MCHC 31.9 03/05/2020 0301   RDW 12.9 03/05/2020 0301   RDW 12.8 01/10/2020 1146   LYMPHSABS 0.9 03/01/2020 2132   LYMPHSABS 2.5 03/01/2015 1414   MONOABS 0.5 03/01/2020 2132   EOSABS 0.1 03/01/2020 2132   EOSABS 0.4 03/01/2015 1414   BASOSABS 0.1 03/01/2020 2132   BASOSABS 0.1 03/01/2015 1414    BMET    Component Value Date/Time   NA 138 03/04/2020 0352   NA 142 01/10/2020 1146   K 3.9 03/04/2020 0352   CL 104 03/04/2020 0352   CO2 22 03/04/2020 0352   GLUCOSE 100 (H) 03/04/2020 0352   BUN 9 03/04/2020 0352   BUN 19 01/10/2020 1146   CREATININE 1.05 03/04/2020 0352   CALCIUM 9.1 03/04/2020 0352   GFRNONAA >60 03/04/2020 0352   GFRAA >60 03/04/2020 0352    INR    Component Value Date/Time   INR 1.0 03/01/2020 2132     Intake/Output Summary (Last 24 hours) at 03/08/2020 0710 Last data filed at 03/08/2020 0500 Gross per 24 hour  Intake 997.63 ml  Output 1400 ml  Net -402.37 ml     Assessment:  69 y.o. male is s/p:  exploration of wound right leg and placement of antibiotics beads due to drainage from lower incision of femoropopliteal bypass  graft   6 Days Post-Op  Plan: -pt pleasant this am but apparently was agitated last evening and attempted to swing at RN while putting on BP cuff.  -pt sensitivities to Cipro.  If okay with Dr. Myra Gianotti,  We can start po Cipro  -if pt discharges soon, the wound vac is to be delivered this morning to his room.  -DVT prophylaxis:  Lovenox   Doreatha Massed, PA-C [facility] [phone] 03/08/2020 7:10 AM  Continue PO abx and dressing changes.  I would like to monitor his incision until I am more certain that he will not require graft explant.  After speaking with his daughter, he will likely need to stay through the weekend. And then be discharged to home.  [name]

## 2020-03-08 NOTE — Progress Notes (Signed)
MOBILITY TEAM - Progress Note   03/08/20 0900  Mobility  Activity Ambulated in hall  Level of Assistance Standby assist, set-up cues, supervision of patient - no hands on  Assistive Device Front wheel walker  Distance Ambulated (ft) 350 ft  Mobility Response Tolerated well  Bed Position  (seated edge of bed to eat breakfast, bed alarm set)   Patient pleasant and agreeable to participate. C/o back pain initially 2/10, then a 1/10 after walk.  Ina Homes, PT, DPT Mobility Team Pager [phone]

## 2020-03-09 ENCOUNTER — Ambulatory Visit: Payer: Medicare Other | Admitting: Podiatry

## 2020-03-09 MED ORDER — CIPROFLOXACIN HCL 500 MG PO TABS
500.0000 mg | ORAL_TABLET | Freq: Two times a day (BID) | ORAL | Status: DC
  Administered 2020-03-09 – 2020-03-12 (×7): 500 mg via ORAL
  Filled 2020-03-09 (×7): qty 1

## 2020-03-09 NOTE — Discharge Instructions (Signed)

## 2020-03-09 NOTE — Progress Notes (Addendum)
  Progress Note    03/09/2020 8:04 AM 7 Days Post-Op  Subjective:  Very pleasant this morning.  C/o back pain he's had since the accident.  Afebrile HR 50's-80's NSR 110's-120's systolic 97% RA  Vitals:   03/19/Lonnie 0422 03/19/Lonnie 0727  BP: 118/87 120/65  Pulse: 73 76  Resp: 15 13  Temp: 98.7 F (37.1 C) 98.1 F (36.7 C)  SpO2: 97% 97%    Physical Exam: Cardiac:  regular Lungs:  Non labored Incisions:      Extremities:  Right foot warm and well perfused.   CBC    Component Value Date/Time   WBC 7.3 03/05/2020 0301   RBC 4.16 (L) 03/05/2020 0301   HGB 11.3 (L) 03/05/2020 0301   HGB 15.7 01/10/2020 1146   HCT 35.4 (L) 03/05/2020 0301   HCT 46.1 01/10/2020 1146   PLT 262 03/05/2020 0301   PLT 232 01/10/2020 1146   MCV 85.1 03/05/2020 0301   MCV 83 01/10/2020 1146   MCH 27.2 03/05/2020 0301   MCHC 31.9 03/05/2020 0301   RDW 12.9 03/05/2020 0301   RDW 12.8 01/10/2020 1146   LYMPHSABS 0.9 03/01/2020 2132   LYMPHSABS 2.5 03/01/2015 1414   MONOABS 0.5 03/01/2020 2132   EOSABS 0.1 03/01/2020 2132   EOSABS 0.4 03/01/2015 1414   BASOSABS 0.1 03/01/2020 2132   BASOSABS 0.1 03/01/2015 1414    BMET    Component Value Date/Time   NA 138 03/04/2020 0352   NA 142 01/10/2020 1146   K 3.9 03/04/2020 0352   CL 104 03/04/2020 0352   CO2 22 03/04/2020 0352   GLUCOSE 100 (H) 03/04/2020 0352   BUN 9 03/04/2020 0352   BUN 19 01/10/2020 1146   CREATININE 0.99 03/08/2020 0642   CALCIUM 9.1 03/04/2020 0352   GFRNONAA >60 03/08/2020 0642   GFRAA >60 03/08/2020 0642    INR    Component Value Date/Time   INR 1.0 03/01/2020 2132     Intake/Output Summary (Last 24 hours) at 03/09/2020 0804 Last data filed at 03/09/2020 0600 Gross per 24 hour  Intake 660 ml  Output 3200 ml  Net -2540 ml     Assessment:  69 y.o. [name] is s/p:  exploration of wound right leg and placement of antibiotics beads due to drainage from lower incision of femoropopliteal bypass graft  7  Days Post-Op  Plan: -pt doing well-pt continues to be afebrile.  Wound with good granulation tissue and abx beads in place.   -changed to po abx (Cipro) this morning and dc IV abx -continue dressing changes -if wound looks good through the weekend, hopeful for discharge on Monday. -DVT prophylaxis:  Lovenox   Doreatha Massed, PA-C [facility] [phone] 03/09/2020 8:04 AM   I agree with the above.  I have seen and evaluated the patient.  His dressing was changed this morning.  The wound is healing nicely.  There is a small track superiorly however this does not go down to the graft.  Antibiotic beads are still present.  I am hopeful that he will be able to heal this without needing graft explant.  I will continue him on antibiotics.  I anticipate him being able to go home early next week  Lonnie [name]

## 2020-03-09 NOTE — Progress Notes (Signed)
MOBILITY TEAM - Progress Note   03/09/20 1500  Mobility  Activity Ambulated in hall  Level of Assistance Standby assist, set-up cues, supervision of patient - no hands on  Assistive Device Front wheel walker  Distance Ambulated (ft) 370 ft  Mobility Response Tolerated well  Mobility performed by Mobility specialist  Bed Position High-fowlers   HR up to 102 while walking.  Ina Homes, PT, DPT Mobility Team Pager [phone]

## 2020-03-09 NOTE — Progress Notes (Signed)
MOBILITY TEAM - Progress Note   03/09/20 0943  Mobility  Activity Ambulated in hall  Level of Assistance Standby assist, set-up cues, supervision of patient - no hands on  Assistive Device Front wheel walker  Distance Ambulated (ft) 370 ft  Mobility Response Tolerated well  Mobility performed by Mobility specialist  Bed Position Semi-fowlers   Patient pleasant and agreeable to participate. Reports feeling "depressed" he has stay until Monday.  Ina Homes, PT, DPT Mobility Team Pager [phone]

## 2020-03-09 NOTE — Plan of Care (Signed)
  Problem: Education: Goal: Knowledge of General Education information will improve Description: Including pain rating scale, medication(s)/side effects and non-pharmacologic comfort measures Outcome: Not Progressing   Problem: Health Behavior/Discharge Planning: Goal: Ability to manage health-related needs will improve Outcome: Not Progressing   

## 2020-03-10 NOTE — Progress Notes (Addendum)
  Progress Note    03/10/2020 9:05 AM 8 Days Post-Op  Subjective:  Says he is going home.  Says his back hurts.  Afebrile HR 70's-80's NSR 110's-130's systolic 97% RA  Vitals:   03/10/20 0100 03/10/20 0501  BP:  114/70  Pulse:  70  Resp:    Temp:  98 F (36.7 C)  SpO2: 99% 97%    Physical Exam: General:  No distress Lungs:  Non labored Incisions:  Right BK incision is wrapped and dressing is clean  Extremities:  Right foot is warm and well perfused   CBC    Component Value Date/Time   WBC 7.3 03/05/2020 0301   RBC 4.16 (L) 03/05/2020 0301   HGB 11.3 (L) 03/05/2020 0301   HGB 15.7 01/10/2020 1146   HCT 35.4 (L) 03/05/2020 0301   HCT 46.1 01/10/2020 1146   PLT 262 03/05/2020 0301   PLT 232 01/10/2020 1146   MCV 85.1 03/05/2020 0301   MCV 83 01/10/2020 1146   MCH 27.2 03/05/2020 0301   MCHC 31.9 03/05/2020 0301   RDW 12.9 03/05/2020 0301   RDW 12.8 01/10/2020 1146   LYMPHSABS 0.9 03/01/2020 2132   LYMPHSABS 2.5 03/01/2015 1414   MONOABS 0.5 03/01/2020 2132   EOSABS 0.1 03/01/2020 2132   EOSABS 0.4 03/01/2015 1414   BASOSABS 0.1 03/01/2020 2132   BASOSABS 0.1 03/01/2015 1414    BMET    Component Value Date/Time   NA 138 03/04/2020 0352   NA 142 01/10/2020 1146   K 3.9 03/04/2020 0352   CL 104 03/04/2020 0352   CO2 22 03/04/2020 0352   GLUCOSE 100 (H) 03/04/2020 0352   BUN 9 03/04/2020 0352   BUN 19 01/10/2020 1146   CREATININE 0.99 03/08/2020 0642   CALCIUM 9.1 03/04/2020 0352   GFRNONAA >60 03/08/2020 0642   GFRAA >60 03/08/2020 0642    INR    Component Value Date/Time   INR 1.0 03/01/2020 2132     Intake/Output Summary (Last 24 hours) at 03/10/2020 0905 Last data filed at 03/09/2020 1700 Gross per 24 hour  Intake 480 ml  Output 1050 ml  Net -570 ml     Assessment:  69 y.o. male is s/p:  exploration of wound right leg and placement of antibiotics beads due to drainage from lower incision of femoropopliteal bypass graft  8 Days  Post-Op  Plan: -continue bid dressing changes.  Dressing changed this am-will take down and inspect wound tomorrow.   Pt has remained afebrile on po Cipro.  Dr. Myra Gianotti inspected wound yesterday and it tracks superiorly but did not go down to the graft and abx beads still present.  He is hopeful for healing without having to remove graft.   -DVT prophylaxis:  Lovenox   Doreatha Massed, PA-C [facility] [phone] 03/10/2020 9:05 AM  Wound overall all clean and granulating no obvious graft exposed. Possible d/c home early next week  Fabienne Bruns, MD [facility] [place] Office: [phone]

## 2020-03-10 NOTE — Progress Notes (Signed)
Called to room due to ECG leads off. Found patient sitting up in bed removing gown and leads. Patient states, that " it's nobody's business what his heart does." Patient belligerent and refusing to allow leads to be replaced. Explained to patient the need to monitor heart but he is insistent the leads be left off. Gown changed patient resituated in bed. Medication given per MD order for agitation. Patient resting at this time Will reattempt to replace leads later.

## 2020-03-10 NOTE — Progress Notes (Signed)
Discussed with patient need to have ECG monitor on patient. Patient became argumentative and refused to have leads replaced, states it's nobody's business about his heart, claims "we are just putting them on to charge him more money." Will notify MD on arrival.

## 2020-03-11 NOTE — Progress Notes (Addendum)
  Progress Note    03/11/2020 10:20 AM 9 Days Post-Op  Subjective:  Pleasant this morning and appreciate. Says he had a pretty good night's sleep  Afebrile HR 70's NSR 120's-130's systolic 97% RA  Vitals:   03/10/20 2035 03/11/20 0800  BP: 130/87 125/84  Pulse: 71 77  Resp: 16 16  Temp: 98.3 F (36.8 C) 98 F (36.7 C)  SpO2: 98% 97%    Physical Exam: Cardiac:  regular Lungs:  Non labored Incisions:  Right BK wound with beefy red granulation tissue.   Extremities:  Brisk doppler signals right DP/PT/peroneal; wound on dorsum of foot healing  CBC    Component Value Date/Time   WBC 7.3 03/05/2020 0301   RBC 4.16 (L) 03/05/2020 0301   HGB 11.3 (L) 03/05/2020 0301   HGB 15.7 01/10/2020 1146   HCT 35.4 (L) 03/05/2020 0301   HCT 46.1 01/10/2020 1146   PLT 262 03/05/2020 0301   PLT 232 01/10/2020 1146   MCV 85.1 03/05/2020 0301   MCV 83 01/10/2020 1146   MCH 27.2 03/05/2020 0301   MCHC 31.9 03/05/2020 0301   RDW 12.9 03/05/2020 0301   RDW 12.8 01/10/2020 1146   LYMPHSABS 0.9 03/01/2020 2132   LYMPHSABS 2.5 03/01/2015 1414   MONOABS 0.5 03/01/2020 2132   EOSABS 0.1 03/01/2020 2132   EOSABS 0.4 03/01/2015 1414   BASOSABS 0.1 03/01/2020 2132   BASOSABS 0.1 03/01/2015 1414    BMET    Component Value Date/Time   NA 138 03/04/2020 0352   NA 142 01/10/2020 1146   K 3.9 03/04/2020 0352   CL 104 03/04/2020 0352   CO2 22 03/04/2020 0352   GLUCOSE 100 (H) 03/04/2020 0352   BUN 9 03/04/2020 0352   BUN 19 01/10/2020 1146   CREATININE 0.99 03/08/2020 0642   CALCIUM 9.1 03/04/2020 0352   GFRNONAA >60 03/08/2020 0642   GFRAA >60 03/08/2020 0642    INR    Component Value Date/Time   INR 1.0 03/01/2020 2132     Intake/Output Summary (Last 24 hours) at 03/11/2020 1020 Last data filed at 03/11/2020 0730 Gross per 24 hour  Intake 240 ml  Output 1000 ml  Net -760 ml     Assessment:  69 y.o. male is s/p:  exploration of wound right leg and placement of  antibiotics beads due to drainage from lower incision of femoropopliteal bypass graft   9 Days Post-Op  Plan: -pt with brisk doppler signals right foot -right below knee incision wound looks good with beefy red granulation tissue.  Pt has been on po abx for a couple of days now and no evidence of infection.  There are still a few abx beads in wound.  Still no obvious graft exposed.  -pt keeps taking off telemetry.  He has been stable.  Okay to discontinue tele.  -DVT prophylaxis:  Lovenox  -hopefully home tomorrow.  May need to continue wet to dry dressing instead of vac given track superiorly and will need to be packed.    Doreatha Massed, PA-C [facility] [phone] 03/11/2020 10:20 AM  Agree with above Dr Myra Gianotti to check wound tomorrow for possible d/c  Fabienne Bruns, MD [facility] [employer] Office: [phone]

## 2020-03-11 NOTE — Plan of Care (Signed)

## 2020-03-11 NOTE — Progress Notes (Signed)
Received call from Mrs Guardia today for update.  Updates given on patient vs and orientation. Mr Bachus has been pleasant and cooperate all weekend not requiring additional medications to calm him.  Family has requested that Dr. Myra Gianotti call them tomorrow with updates.  States that the family was told that the patient would not be discharged home until Wed, and that they will not be able to take him home before.  Received a call from Daughter and she reiterated that they will not be able to take Mr Hamme home and that they would like Dr. Myra Gianotti to call them tomorrow with updates.  Mrs Feltes best phone # [phone]

## 2020-03-12 MED ORDER — HYDROCODONE-ACETAMINOPHEN 5-325 MG PO TABS: 1.0000 | ORAL_TABLET | ORAL | 0 refills | Status: DC | PRN

## 2020-03-12 MED ORDER — CIPROFLOXACIN HCL 500 MG PO TABS: 500.0000 mg | ORAL_TABLET | Freq: Two times a day (BID) | ORAL | 0 refills | Status: AC

## 2020-03-12 NOTE — TOC Transition Note (Signed)
Transition of Care [facility]) - CM/SW Discharge Note Donn Pierini RN, BSN Transitions of Care Unit 4E- RN Case Manager [phone]   Patient Details  Name: Lonnie [name] MRN: [id number] Date of Birth: 08-19-1951  Transition of Care [facility]) CM/SW Contact:  Darrold Span, RN Phone Number: 03/12/2020, 4:29 PM   Clinical Narrative:    Pt stable for transition home, VAC has been removed, call made to [facility] with [facility] to have home wound VAC picked up for return to [facility]. HHRN order for wound care placed- Referral has been given to [facility] per previous CM- call made to [facility] with [facility] for start of care needs. Pt's family to provide transport home.    Final next level of care: Home w Home Health Services Barriers to Discharge: No Barriers Identified   Patient Goals and CMS Choice Patient states their goals for this hospitalization and ongoing recovery are:: return home      Discharge Placement          Home with [facility]             Discharge Plan and Services                DME Arranged: Vac DME Agency: [employer] Date DME Agency Contacted: 03/07/20 Time DME Agency Contacted: 0900 Representative spoke with at DME Agency: Lambert Mody HH Arranged: RN HH Agency: [facility] Date HH Agency Contacted: 03/12/20 Time HH Agency Contacted: 1629 Representative spoke with at [facility] Agency: Lupita Leash  Social Determinants of Health (SDOH) Interventions     Readmission Risk Interventions Readmission Risk Prevention Plan 03/12/2020 03/05/2020  Transportation Screening - Complete  PCP or Specialist Appt within 3-5 Days - Complete  HRI or Home Care Consult - Complete  Social Work Consult for Recovery Care Planning/Counseling - Complete  Palliative Care Screening - Not Applicable  Medication Review Oceanographer) Complete -  Some recent data might be hidden

## 2020-03-12 NOTE — Progress Notes (Addendum)
[facility] [place]  Subjective  - No new complaints.   Objective (!) 147/89 77 98.9 F (37.2 C) (Oral) 14 100%  Intake/Output Summary (Last 24 hours) at 03/12/2020 0734 Last data filed at 03/12/2020 0522 Gross per 24 hour  Intake 720 ml  Output 2050 ml  Net -1330 ml    Right foot warm to touch Right BK incision wound bed appears healthy, wet to dry packing placed Moving all extremities Lungs non labored   Assessment/Planning: POD # 34 69 y.o. male is s/p:  exploration of wound right leg and placement of antibiotics beads due to drainage from lower incision of femoropopliteal bypass graft   Right foot well perfused Right leg incision healing well, skin edges without erythema or edema. Possible discharge once Towana Stenglein talks to his daughter. Cont. Lipitor, asa, and Plavix.   Mosetta Pigeon 03/12/2020 7:34 AM --  Laboratory Lab Results: No results for input(s): WBC, HGB, HCT, PLT in the last 72 hours. BMET No results for input(s): NA, K, CL, CO2, GLUCOSE, BUN, CREATININE, CALCIUM in the last 72 hours.  COAG Lab Results  Component Value Date   INR 1.0 03/01/2020   INR 0.9 01/20/2020   INR 1.0 06/30/2019   No results found for: PTT   I agree with the above.  Dressing was changed today.  His wound appears to be healing nicely.  He will need to go home on 6 weeks worth of antibiotics.  I will reach out to his daughter today to see if he can be discharged  [name]

## 2020-03-12 NOTE — Progress Notes (Signed)
Patient is upset with wife and daughter. He states they do not want him to come home. He just wants to get out of here so he can move to his house in [place]. I explained that his daughter had been calling to speak with doctor. I told him I would page MD and let him know that patient wants to leave. He says he will walk home if it takes him till dark to get there if they do not want to pick him up. Asked patient if he wanted something to help with his agitation but he says no. States he just needs to get away from his family when it gets like this.

## 2020-03-12 NOTE — Progress Notes (Addendum)
Spoke with daughter to let her know that I had spoken to Dr Myra Gianotti about discharge. Daughter tried to say that we had been holding up discharge because no one had went over discharge paperwork with her or told her that her dad was ready for discharge. I explained that I told her earlier in the morning that doctor would call her about discharge and patient stated he was ready. She said she wanted medical staff to tell her he was ready. I attempted to call her about discharge and she said Dr. Myra Gianotti was on the phone calling her. I attempted to update patient but he was asleep.

## 2020-03-12 NOTE — Progress Notes (Signed)
Daughter called to discuss patient discharge. Daughter does not want discharge information relayed to patient because he was told before he could go and discharge was delayed. Daughter says she was told he would not be discharged till Wednesday. I told daughter that patient was ready for discharge awaiting doctor to speak with family. Daughter stated she would not be able to get him before Tuesday. I explained that the doctor would call her. PA Marisue Humble made aware. Pt resting with call bell within reach.  Will continue to monitor. Thomas Hoff, RN

## 2020-03-12 NOTE — Discharge Summary (Signed)
[facility] Discharge Summary   Patient ID:  COUPER JUNCAJ MRN: [id number] DOB/AGE: 1951-03-18 69 y.o.  Admit date: 03/01/2020 Discharge date: 03/12/2020 Date of Surgery: 03/02/2020 Surgeon: Surgeon(s): Chuck Hint, MD  Admission Diagnosis: PAD (peripheral artery disease) (HCC) [I73.9] Cellulitis of right lower extremity [L03.115] Fever, unspecified fever cause [R50.9] Postoperative surgical complication involving circulatory system associated with circulatory procedure, unspecified complication [I97.89]  Discharge Diagnoses:  PAD (peripheral artery disease) (HCC) [I73.9] Cellulitis of right lower extremity [L03.115] Fever, unspecified fever cause [R50.9] Postoperative surgical complication involving circulatory system associated with circulatory procedure, unspecified complication [I97.89]  Secondary Diagnoses: Past Medical History:  Diagnosis Date  . Allergy   . Anxiety   . Arthritis    left neck, shoulder, knee  . CAD (coronary artery disease)    a. anterior STEMI 10/2013 s/p 4V CABG with LIMA to mid LAD, SVG to OM, SVG to PDA, SVG to Diagonal.  . Dementia (HCC)   . Depression   . Falls   . Hypertension   . Ischemic cardiomyopathy    a. EF 40-45% at time of CABG and in 2018.  . MI (myocardial infarction) (HCC)   . Pre-diabetes   . Prediabetes 09/01/2014  . PVD (peripheral vascular disease) (HCC)    a. s/p L SFA stents with now known bilateral SFA occlusion treated medically.  . Stroke [facility])    seen on CT Scan  . Tobacco abuse     Procedure(s): IRRIGATION AND DEBRIDEMENT EXTREMITY right  lower leg  with Antibiotic beads.  Discharged Condition: good  HPI: Mr. Lonnie [name] is a 69 y.o. male with history of coronary artery disease status post CABG, hypertension, cardiomyopathy, dementia, peripheral vascular disease status post recent right leg bypass that presented to the ED with fever of 102, increased redness to the right leg, and  concern for infected bypass on 03/01/20.  Patient recently had a right femoral to below-knee popliteal artery bypass with 6 mm PTFE on 01/20/2020 with Dr. Myra Gianotti for right foot ulcer.  Patient was seen in clinic on Tuesday 02/28/20 by the PA with a small wound in the below-knee popliteal incision and he was instructed to pack this with home health.  Apparently called triage on 3/11 and was put on Keflex over the phone and told to go to Ed if he got a fever. He presented to the ED with fever, increased confusion, and concern for cellulitis of his right lower extremity with draining wound. He was subsequently admitted for IV antibiotics and exploration of right lower extremity wound in the OR   Hospital Course:  Lonnie [name] is a 69 y.o. male is S/P Right exploration and washout of wound of right leg and placement of antibiotic beads by Dr. Edilia Bo on 03/02/20 due to drainage from lower incision of femoropopliteal bypass graft.  POD#1 he remained afebrile with WBC count 9.1. Continued IV antibiotics. Wound dressing was not changed but remained clean dry and intact.  POD#2 patient did well overnight. Remained relatively cooperative. Cultures grew out pseudomonas aeruginosa. Antibiotics changed from Vancomycin to Cefepime per Pharmacy  POD#3 continued to do well. IV antibiotics continued. Remained afebrile. Plan was to continue dressing changes and monitor wound  POD#4 Patient's IV was removed due to redness and pt refused new IV access. Low grade fever over night. Was agitated and wanting to go home. Patient was agreeable to new IV to received antibiotics  POD#5 wound continuing to look clean and heal adequately. HH orders placed. Wound  continuing to be monitored for concerns of graft infection  POD#6 patient remained agitated. IV antibiotics d/c and transitioned to PO Cipro. Patient and family agreeable to stay the weekend for continued wound monitoring  POD#7 continued right leg wound healing.  Remained afebrile  POD#8 wound clean and granulating. No exposed graft to date. On po antibiotics  POD#9 continued adequate perfusion and wound healing of right lower extremity. Afebrile. Off of telemetry and remained stable  POD#10 right leg incision wound healthy, wet to dry packing continued. No involvement of graft. Right lower extremity adequately perfused. Discharge home discussed with patient and his family. [place] arranged. Patient plan to go home on Lipitor, Aspirin and Plavix  Procedure(s): IRRIGATION AND DEBRIDEMENT EXTREMITY right  lower leg  with Antibiotic beads. Extubated: POD # 0  Post-op wounds healing well Pt. Ambulating, voiding and taking PO diet without difficulty. Pt pain controlled with PO pain meds. Labs as below Complications:none  Consults:  Treatment Team:  Serafina Mitchell, MD  Significant Diagnostic Studies: CBC Lab Results  Component Value Date   WBC 7.3 03/05/2020   HGB 11.3 (L) 03/05/2020   HCT 35.4 (L) 03/05/2020   MCV 85.1 03/05/2020   PLT 262 03/05/2020    BMET    Component Value Date/Time   NA 138 03/04/2020 0352   NA 142 01/10/2020 1146   K 3.9 03/04/2020 0352   CL 104 03/04/2020 0352   CO2 22 03/04/2020 0352   GLUCOSE 100 (H) 03/04/2020 0352   BUN 9 03/04/2020 0352   BUN 19 01/10/2020 1146   CREATININE 0.99 03/08/2020 0642   CALCIUM 9.1 03/04/2020 0352   GFRNONAA >60 03/08/2020 0642   GFRAA >60 03/08/2020 0642   COAG Lab Results  Component Value Date   INR 1.0 03/01/2020   INR 0.9 01/20/2020   INR 1.0 06/30/2019     Disposition:  Discharge to :Home  Allergies as of 03/12/2020      Reactions   Ace Inhibitors Swelling   Angioedema   Dairy Aid [lactase]    gas   Eggs Or Egg-derived Products    Cannot eat Prepared Eggs   Latex Itching      Medication List    STOP taking these medications   cephALEXin 500 MG capsule Commonly known as: KEFLEX     TAKE these medications   acetaminophen 325 MG tablet Commonly  known as: TYLENOL Take 2 tablets (650 mg total) by mouth 4 (four) times daily -  before meals and at bedtime. What changed:   when to take this  reasons to take this   aspirin EC 81 MG tablet Take 81 mg by mouth daily.   atorvastatin 80 MG tablet Commonly known as: LIPITOR TAKE 1 TABLET BY MOUTH  DAILY AT 6 PM What changed:   how much to take  how to take this  when to take this   Calcium 600+D 600-800 MG-UNIT Tabs Generic drug: Calcium Carb-Cholecalciferol Take 1 tablet by mouth daily.   ciprofloxacin 500 MG tablet Commonly known as: Cipro Take 1 tablet (500 mg total) by mouth 2 (two) times daily.   clonazePAM 0.25 MG disintegrating tablet Commonly known as: KLONOPIN Take 1 tablet (0.25 mg total) by mouth 2 (two) times daily as needed.   clopidogrel 75 MG tablet Commonly known as: PLAVIX Take 1 tablet (75 mg total) by mouth daily.   fluticasone 50 MCG/ACT nasal spray Commonly known as: FLONASE Place 2 sprays into both nostrils daily. What changed:  when to take this  reasons to take this   gabapentin 100 MG capsule Commonly known as: NEURONTIN Take 1 capsule (100 mg total) by mouth 2 (two) times daily.   HYDROcodone-acetaminophen 5-325 MG tablet Commonly known as: NORCO/VICODIN Take 1 tablet by mouth every 4 (four) hours as needed for moderate pain.   irbesartan 300 MG tablet Commonly known as: AVAPRO Take 1 tablet (300 mg total) by mouth daily.   loratadine 10 MG tablet Commonly known as: CLARITIN Take 1 tablet (10 mg total) by mouth daily.   lovastatin 40 MG tablet Commonly known as: MEVACOR Take 80 mg by mouth at bedtime.   melatonin 3 MG Tabs tablet Take 1 tablet by mouth at bedtime.   metoprolol tartrate 25 MG tablet Commonly known as: LOPRESSOR Take 1 tablet (25 mg total) by mouth 2 (two) times daily.   pantoprazole 40 MG tablet Commonly known as: PROTONIX TAKE 1 TABLET BY MOUTH EVERYDAY AT BEDTIME What changed: See the new  instructions.   QUEtiapine 200 MG tablet Commonly known as: SEROQUEL Take 1 tablet (200 mg total) by mouth at bedtime.   Santyl ointment Generic drug: collagenase Apply 1 application topically daily. Measurements right foot ulcer 2. 0 x 1.0 x 0.3cm   tamsulosin 0.4 MG Caps capsule Commonly known as: FLOMAX Take 2 capsules (0.8 mg total) by mouth daily with supper. What changed: See the new instructions.      Verbal and written Discharge instructions given to the patient. Wound care per Discharge AVS Follow-up Information    [facility].   Specialty: Home Health Services Why: For home health services  [address] [place], [place] [address] [phone]        Nada Libman, MD Follow up in 2 day(s).   Specialties: Vascular Surgery, Cardiology Why: patient has follow up appointment on 03/16/20. The office with contact the patient for follow up appointment Contact information: [address] [place] [address] [phone]           Signed: Graceann Congress 03/12/2020, 4:23 PM

## 2020-03-13 ENCOUNTER — Ambulatory Visit: Payer: Medicare Other | Attending: Internal Medicine

## 2020-03-13 DIAGNOSIS — Z23 Encounter for immunization: Secondary | ICD-10-CM

## 2020-03-13 NOTE — Progress Notes (Signed)
   Covid-19 Vaccination Clinic  Name:  Lonnie [name]    MRN: [id number] DOB: 1951-06-11  03/13/2020  [name] was observed post Covid-19 immunization for 15 minutes without incident. He was provided with Vaccine Information Sheet and instruction to access the V-Safe system.   [name] was instructed to call 911 with any severe reactions post vaccine: Marland Kitchen Difficulty breathing  . Swelling of face and throat  . A fast heartbeat  . A bad rash all over body  . Dizziness and weakness   Immunizations Administered    Name Date Dose VIS Date Route   Pfizer COVID-19 Vaccine 03/13/2020  4:11 PM 0.3 mL 12/02/2019 Intramuscular   Manufacturer: [employer], [employer]   Lot: [id number]   NDC: [address]-4

## 2020-03-14 ENCOUNTER — Telehealth: Payer: Self-pay | Admitting: Podiatry

## 2020-03-14 ENCOUNTER — Telehealth: Payer: Self-pay

## 2020-03-14 NOTE — Telephone Encounter (Signed)
Pt wife called stating that the medication SANTYL that was sent over to there pharmacy [facility]  Is not there and they would like to be able to get that medication please advise

## 2020-03-14 NOTE — Telephone Encounter (Signed)
-----   Message from Graceann Congress, [place] sent at 03/14/2020  3:02 PM EDT ----- Regarding: RE: Home Health. Mariana Arn, sorry for late response I saw your page while driving. He is okay to have wound care 3x per week. He was not suppose to have a wound VAC. Until family is okay with changing dressings daily the current HH orders are okay  thanks ----- Message ----- From: Yolonda Kida, LPN Sent: 08/24/2839   2:50 PM EDT To: Graceann Congress, PA-C, Nada Libman, MD Subject: Home Health.                                   Hello.  I tried to page but patient's daughter is concerned about wound care orders.  I did not see anything in the discharge notes other than contact Home Health.  Daughter said patient has a big open wound but HH is only wanting to only come out 3 times a week.  She is not sure if he should've had a wound Vac or not.  HH will not come out everyday if it is just for a wet to dry dressing.  They will teach the patient's family to change it everyday and [name] does not feel ready to do that.  Please advise.  Thank you,  Ernst Spell., LPN

## 2020-03-15 ENCOUNTER — Other Ambulatory Visit: Payer: Self-pay | Admitting: Internal Medicine

## 2020-03-15 NOTE — Telephone Encounter (Signed)
Left message informing pt the prescription had been sent to a specialty pharmacy that would pre-cert his insurance to check cost to him and they maybe trying to contact him [facility] [phone], and he could contact them to discuss coverage.

## 2020-03-16 ENCOUNTER — Ambulatory Visit (INDEPENDENT_AMBULATORY_CARE_PROVIDER_SITE_OTHER): Payer: Self-pay | Admitting: Physician Assistant

## 2020-03-16 ENCOUNTER — Other Ambulatory Visit: Payer: Self-pay

## 2020-03-16 VITALS — BP 103/70 | HR 69 | Temp 98.6°F | Ht 74.0 in

## 2020-03-16 DIAGNOSIS — Z95828 Presence of other vascular implants and grafts: Secondary | ICD-10-CM

## 2020-03-16 DIAGNOSIS — I739 Peripheral vascular disease, unspecified: Secondary | ICD-10-CM

## 2020-03-16 DIAGNOSIS — I70245 Atherosclerosis of native arteries of left leg with ulceration of other part of foot: Secondary | ICD-10-CM

## 2020-03-16 NOTE — Progress Notes (Signed)
POST OPERATIVE OFFICE NOTE    CC:  F/u for surgery  HPI:  This is a 69 y.o. male who is s/p right femoral to below knee popliteal bypass 01/20/20 by Dr. Myra Gianotti. He subsequently presented with popliteal incision dehiscence and drainage that required exploration and washout of the wound with placement of antibiotic beads by Dr. Edilia Bo on 03/02/20.  He follows up today for wound check. He has been getting HH wound care dressing changes now 3x/week. He is continuing his Cipro. He denies any pain in the right leg or foot.   He denies any fever, chills, shortness of breath, chest pain, nausea, vomiting or diarrhea.  Allergies  Allergen Reactions  . Ace Inhibitors Swelling    Angioedema  . Dairy Aid [Lactase]     gas  . Eggs Or Egg-Derived Products     Cannot eat Prepared Eggs  . Latex Itching    Current Outpatient Medications  Medication Sig Dispense Refill  . acetaminophen (TYLENOL) 325 MG tablet Take 2 tablets (650 mg total) by mouth 4 (four) times daily -  before meals and at bedtime. (Patient taking differently: Take 650 mg by mouth 4 (four) times daily as needed (pain.). )    . aspirin EC 81 MG tablet Take 81 mg by mouth daily.    Marland Kitchen atorvastatin (LIPITOR) 80 MG tablet TAKE 1 TABLET BY MOUTH  DAILY AT 6 PM (Patient taking differently: Take 80 mg by mouth daily at 6 PM. TAKE 1 TABLET BY MOUTH  DAILY AT 6 PM) 90 tablet 3  . Calcium Carb-Cholecalciferol (CALCIUM 600+D) 600-800 MG-UNIT TABS Take 1 tablet by mouth daily.    . ciprofloxacin (CIPRO) 500 MG tablet Take 1 tablet (500 mg total) by mouth 2 (two) times daily. 84 tablet 0  . clonazePAM (KLONOPIN) 0.25 MG disintegrating tablet Take 1 tablet (0.25 mg total) by mouth 2 (two) times daily as needed. 60 tablet 1  . clopidogrel (PLAVIX) 75 MG tablet Take 1 tablet (75 mg total) by mouth daily. 30 tablet 11  . collagenase (SANTYL) ointment Apply 1 application topically daily. Measurements right foot ulcer 2. 0 x 1.0 x 0.3cm 30 g 5  .  fluticasone (FLONASE) 50 MCG/ACT nasal spray Place 2 sprays into both nostrils daily. (Patient taking differently: Place 2 sprays into both nostrils daily as needed for allergies. )  2  . gabapentin (NEURONTIN) 100 MG capsule Take 1 capsule (100 mg total) by mouth 2 (two) times daily. 60 capsule 3  . HYDROcodone-acetaminophen (NORCO/VICODIN) 5-325 MG tablet Take 1 tablet by mouth every 4 (four) hours as needed for moderate pain. 8 tablet 0  . irbesartan (AVAPRO) 300 MG tablet Take 1 tablet (300 mg total) by mouth daily. 90 tablet 3  . loratadine (CLARITIN) 10 MG tablet Take 1 tablet (10 mg total) by mouth daily.    Marland Kitchen lovastatin (MEVACOR) 40 MG tablet Take 80 mg by mouth at bedtime.    . Melatonin 3 MG TABS Take 1 tablet by mouth at bedtime.    . metoprolol tartrate (LOPRESSOR) 25 MG tablet Take 1 tablet (25 mg total) by mouth 2 (two) times daily. 180 tablet 1  . pantoprazole (PROTONIX) 40 MG tablet TAKE 1 TABLET BY MOUTH EVERYDAY AT BEDTIME (Patient taking differently: Take 40 mg by mouth at bedtime. ) 30 tablet 5  . QUEtiapine (SEROQUEL) 200 MG tablet Take 1 tablet (200 mg total) by mouth at bedtime. 90 tablet 0  . tamsulosin (FLOMAX) 0.4 MG CAPS capsule Take  2 capsules (0.8 mg total) by mouth daily with supper. 180 capsule 3   No current facility-administered medications for this visit.     ROS:  See HPI  Physical Exam:  Vitals:   03/16/20 1400  BP: 103/70  Pulse: 69  Temp: 98.6 F (37 C)  TempSrc: Temporal  SpO2: 98%  Height: 6\' 2"  (1.88 m)   General: Well appearing, well nourished, not in any discomfort Cardiac: regular rate and rhythm Lungs: non labored, equal expansion bilaterally Incision:  Right medial leg incision wound clean wound bed with healthy tissue, some bleeding from wound. No purulent drainage. Visible antibiotic beads in wound bed. Wet to dry packing placed. No surrounding erythema.  Extremities:  Right lower extremity well perfused and warm. Dorsal foot wound dry  with eschar. No swelling, no erythema, no tenderness,  Neuro: alert and oriented   Assessment/Plan:  This is a 69 y.o. male who is s/p right femoral to below knee popliteal bypass 01/20/20 by Dr. Trula Slade. He subsequently presented with popliteal incision dehiscence and drainage that required exploration and washout of the wound with placement of antibiotic beads by Dr. Scot Dock on 03/02/20. His wound is healing appropriately. He has been getting HH wound care 3x/week. Patients wife and daughter do not feel comfortable at this point caring for the wound. They would like referral to [facility] to go to outpatient wound care the days they are not getting home health. I will make this referral for them.   - Continue wet- to -dry daily dressing changes - He will continue his Cipro for 6 weeks -He will follow up in 2-3 weeks for incision check with Dr. Marcell Anger, PA-C [facility] [phone]  Clinic MD:  Donzetta Matters

## 2020-03-30 ENCOUNTER — Telehealth: Payer: Self-pay | Admitting: Internal Medicine

## 2020-03-30 MED ORDER — QUETIAPINE FUMARATE 200 MG PO TABS: 200.0000 mg | ORAL_TABLET | Freq: Two times a day (BID) | ORAL | 0 refills | Status: DC

## 2020-03-30 NOTE — Telephone Encounter (Signed)
Patient's daughter is calling in regards to QUEtiapine (SEROQUEL) 200 MG tablet [[id number]] . Patient's daughter states that mg is incorrect. Patient send take 2 pills a day like prescribed before.  1 in mornings and 1 in evenings .   CALLER : Bard Herbert Back # [phone]

## 2020-03-30 NOTE — Telephone Encounter (Signed)
Lonnie [name] is correct- Seroquel was increased to 200mg  bid on 01/23/2020- unsure how it got switched back to once daily. Will resend to pharmacy w/ correct sig.

## 2020-04-05 ENCOUNTER — Other Ambulatory Visit: Payer: Self-pay | Admitting: Internal Medicine

## 2020-04-08 ENCOUNTER — Telehealth: Payer: Self-pay | Admitting: Internal Medicine

## 2020-04-09 ENCOUNTER — Telehealth: Payer: Self-pay

## 2020-04-09 NOTE — Telephone Encounter (Signed)
Clonazepam refill.   Last OV: 12/30/19 Last Fill: 01/19/2020 #60 and 1RF Pt sig: 1 tab bid prn UDS: None

## 2020-04-09 NOTE — Telephone Encounter (Signed)
Refill request has been sent to PCP. 

## 2020-04-09 NOTE — Telephone Encounter (Signed)
Patient called in to see if Dr. Drue Novel could send in a prescription for  clonazePAM (KLONOPIN) 0.25 MG disintegrating tablet [[id number]]    Please send it to [id number] [place], [place] - [address]  [address] [place] [address]  Phone:  [phone] Fax:  [phone]  DEA #:  [id number]

## 2020-04-09 NOTE — Telephone Encounter (Signed)
Prescription sent, PDMP okay 

## 2020-04-13 ENCOUNTER — Telehealth (HOSPITAL_COMMUNITY): Payer: Self-pay

## 2020-04-13 NOTE — Telephone Encounter (Signed)

## 2020-04-16 ENCOUNTER — Telehealth: Payer: Self-pay | Admitting: Internal Medicine

## 2020-04-16 ENCOUNTER — Ambulatory Visit: Payer: Medicare Other | Admitting: Surgery
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
## 2020-04-16 NOTE — Telephone Encounter (Signed)
40 minutes please

## 2020-04-16 NOTE — Telephone Encounter (Signed)
Arrange a in person office visit
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
## 2020-04-17 NOTE — Telephone Encounter (Signed)
Okay, advise daughter that we will have to do a virtual visit, understanding that we might need him come to the office for a urine sample or blood drawing.

## 2020-04-17 NOTE — Telephone Encounter (Signed)
Can you help w/ this please?

## 2020-04-19 NOTE — Telephone Encounter (Signed)
Sherri hs been speaking with pt's daughter regarding the pt.  Sherri called his daughter on Tuesday 4/27 at 9:47am and left her a message.

## 2020-04-20 NOTE — Telephone Encounter (Signed)
Spoke to Bowles. She stated her dad has been doing good for the last few days. He did agree to come in an see Drue Novel and get labs done... he has been scheduled for Fri 05/07 in person

## 2020-04-23 ENCOUNTER — Encounter: Payer: Self-pay | Admitting: *Deleted

## 2020-04-23 ENCOUNTER — Ambulatory Visit (INDEPENDENT_AMBULATORY_CARE_PROVIDER_SITE_OTHER): Payer: Medicare Other | Admitting: *Deleted

## 2020-04-23 ENCOUNTER — Other Ambulatory Visit: Payer: Self-pay

## 2020-04-23 DIAGNOSIS — Z0000 Encounter for general adult medical examination without abnormal findings: Secondary | ICD-10-CM

## 2020-04-23 NOTE — Patient Instructions (Signed)
Please schedule your next medicare wellness visit with me in 1 yr.  Continue to eat heart healthy diet (full of fruits, vegetables, whole grains, lean protein, water--limit salt, fat, and sugar intake) and increase physical activity as tolerated.  Continue doing brain stimulating activities (puzzles, reading, adult coloring books, staying active) to keep memory sharp.   Bring a copy of your living will and/or healthcare power of attorney to your next office visit.   Lonnie [name] , Thank you for taking time to come for your Medicare Wellness Visit. I appreciate your ongoing commitment to your health goals. Please review the following plan we discussed and let me know if I can assist you in the future.   These are the goals we discussed: Goals    . LIFESTYLE - DECREASE FALLS RISK       This is a list of the screening recommended for you and due dates:  Health Maintenance  Topic Date Due  . Flu Shot  07/22/2020  . Pneumonia vaccines (2 of 2 - PPSV23) 09/11/2020  . Tetanus Vaccine  04/09/2024  . Colon Cancer Screening  10/10/2025  . COVID-19 Vaccine  Completed  .  Hepatitis C: One time screening is recommended by Center for Disease Control  (CDC) for  adults born from [birth year] through 1963.   Completed    Preventive Care 68 Years and Older, Male Preventive care refers to lifestyle choices and visits with your health care provider that can promote health and wellness. This includes:  A yearly physical exam. This is also called an annual well check.  Regular dental and eye exams.  Immunizations.  Screening for certain conditions.  Healthy lifestyle choices, such as diet and exercise. What can I expect for my preventive care visit? Physical exam Your health care provider will check:  Height and weight. These may be used to calculate body mass index (BMI), which is a measurement that tells if you are at a healthy weight.  Heart rate and blood pressure.  Your skin for abnormal  spots. Counseling Your health care provider may ask you questions about:  Alcohol, tobacco, and drug use.  Emotional well-being.  Home and relationship well-being.  Sexual activity.  Eating habits.  History of falls.  Memory and ability to understand (cognition).  Work and work Statistician. What immunizations do I need?  Influenza (flu) vaccine  This is recommended every year. Tetanus, diphtheria, and pertussis (Tdap) vaccine  You may need a Td booster every 10 years. Varicella (chickenpox) vaccine  You may need this vaccine if you have not already been vaccinated. Zoster (shingles) vaccine  You may need this after age 47. Pneumococcal conjugate (PCV13) vaccine  One dose is recommended after age 26. Pneumococcal polysaccharide (PPSV23) vaccine  One dose is recommended after age 23. Measles, mumps, and rubella (MMR) vaccine  You may need at least one dose of MMR if you were born in 1957 or later. You may also need a second dose. Meningococcal conjugate (MenACWY) vaccine  You may need this if you have certain conditions. Hepatitis A vaccine  You may need this if you have certain conditions or if you travel or work in places where you may be exposed to hepatitis A. Hepatitis B vaccine  You may need this if you have certain conditions or if you travel or work in places where you may be exposed to hepatitis B. Haemophilus influenzae type b (Hib) vaccine  You may need this if you have certain conditions. You may receive  vaccines as individual doses or as more than one vaccine together in one shot (combination vaccines). Talk with your health care provider about the risks and benefits of combination vaccines. What tests do I need? Blood tests  Lipid and cholesterol levels. These may be checked every 5 years, or more frequently depending on your overall health.  Hepatitis C test.  Hepatitis B test. Screening  Lung cancer screening. You may have this screening  every year starting at age 14 if you have a 30-pack-year history of smoking and currently smoke or have quit within the past 15 years.  Colorectal cancer screening. All adults should have this screening starting at age 82 and continuing until age 70. Your health care provider may recommend screening at age 1 if you are at increased risk. You will have tests every 1-10 years, depending on your results and the type of screening test.  Prostate cancer screening. Recommendations will vary depending on your family history and other risks.  Diabetes screening. This is done by checking your blood sugar (glucose) after you have not eaten for a while (fasting). You may have this done every 1-3 years.  Abdominal aortic aneurysm (AAA) screening. You may need this if you are a current or former smoker.  Sexually transmitted disease (STD) testing. Follow these instructions at home: Eating and drinking  Eat a diet that includes fresh fruits and vegetables, whole grains, lean protein, and low-fat dairy products. Limit your intake of foods with high amounts of sugar, saturated fats, and salt.  Take vitamin and mineral supplements as recommended by your health care provider.  Do not drink alcohol if your health care provider tells you not to drink.  If you drink alcohol: ? Limit how much you have to 0-2 drinks a day. ? Be aware of how much alcohol is in your drink. In the [place] one drink equals one 12 oz bottle of beer (355 mL), one 5 oz glass of wine (148 mL), or one 1 oz glass of hard liquor (44 mL). Lifestyle  Take daily care of your teeth and gums.  Stay active. Exercise for at least 30 minutes on 5 or more days each week.  Do not use any products that contain nicotine or tobacco, such as cigarettes, e-cigarettes, and chewing tobacco. If you need help quitting, ask your health care provider.  If you are sexually active, practice safe sex. Use a condom or other form of protection to prevent STIs  (sexually transmitted infections).  Talk with your health care provider about taking a low-dose aspirin or statin. What's next?  Visit your health care provider once a year for a well check visit.  Ask your health care provider how often you should have your eyes and teeth checked.  Stay up to date on all vaccines. This information is not intended to replace advice given to you by your health care provider. Make sure you discuss any questions you have with your health care provider. Document Revised: 12/02/2018 Document Reviewed: 12/02/2018 Elsevier Patient Education  2020 [employer].

## 2020-04-23 NOTE — Progress Notes (Signed)
This visit is being conducted via phone call  - after an attmept to do on video chat - due to the COVID-19 pandemic. This patient has given me verbal consent via phone to conduct this visit, patient states they are participating from their home address. Some vital signs may be absent or patient reported.   Patient identification: identified by name, DOB, and current address.   Call done with dtr [name]. Father has dementia.  Subjective:   Lonnie [name] is a 69 y.o. male who presents for an Initial Medicare Annual Wellness Visit.  Review of Systems  Home Safety/Smoke Alarms: Feels safe in home. Smoke alarms in place.  Lives w/ wife. 3 story home. Does not have to use stairs. Dtr lives 44min away. Pt uses walker. Aide comes Mondays and Thursday 3 hrs each day.   Male:   CCS-  10/11/15.  Recall 10 yrs  PSA-  Lab Results  Component Value Date   PSA 2.31 01/16/2017   PSA 1.74 01/02/2015      Objective:     Advanced Directives 04/23/2020 03/05/2020 01/23/2020 01/20/2020 01/18/2020 07/08/2019 06/20/2016  Does Patient Have a Medical Advance Directive? Yes Yes Yes Yes No Yes No  Type of Paramedic of [place];Living will Living will Healthcare Power of [place] -  Does patient want to make changes to medical advance directive? No - Patient declined No - Patient declined No - Patient declined - - No - Patient declined -  Copy of [place] in Chart? No - copy requested - - Yes - validated most recent copy scanned in chart (See row information) - No - copy requested -  Would patient like information on creating a medical advance directive? - - - - No - Patient declined - -  Pre-existing out of facility DNR order (yellow form or pink MOST form) - - - - - - -    Current Medications (verified) Outpatient Encounter Medications as of 04/23/2020  Medication Sig  . acetaminophen (TYLENOL) 325 MG  tablet Take 2 tablets (650 mg total) by mouth 4 (four) times daily -  before meals and at bedtime. (Patient taking differently: Take 650 mg by mouth 4 (four) times daily as needed (pain.). )  . aspirin EC 81 MG tablet Take 81 mg by mouth daily.  Marland Kitchen atorvastatin (LIPITOR) 80 MG tablet TAKE 1 TABLET BY MOUTH  DAILY AT 6 PM (Patient taking differently: Take 80 mg by mouth daily at 6 PM. TAKE 1 TABLET BY MOUTH  DAILY AT 6 PM)  . Calcium Carb-Cholecalciferol (CALCIUM 600+D) 600-800 MG-UNIT TABS Take 1 tablet by mouth daily.  . ciprofloxacin (CIPRO) 500 MG tablet Take 1 tablet (500 mg total) by mouth 2 (two) times daily.  . clonazePAM (KLONOPIN) 0.25 MG disintegrating tablet TAKE 1 TABLET BY MOUTH 2 TIMES DAILY AS NEEDED.  Marland Kitchen clopidogrel (PLAVIX) 75 MG tablet Take 1 tablet (75 mg total) by mouth daily.  . fluticasone (FLONASE) 50 MCG/ACT nasal spray Place 2 sprays into both nostrils daily. (Patient taking differently: Place 2 sprays into both nostrils daily as needed for allergies. )  . gabapentin (NEURONTIN) 100 MG capsule Take 1 capsule (100 mg total) by mouth 2 (two) times daily.  . irbesartan (AVAPRO) 300 MG tablet Take 1 tablet (300 mg total) by mouth daily.  Marland Kitchen loratadine (CLARITIN) 10 MG tablet Take 1 tablet (10 mg total) by mouth daily.  Marland Kitchen lovastatin (MEVACOR)  40 MG tablet Take 80 mg by mouth at bedtime.  . Melatonin 3 MG TABS Take 1 tablet by mouth at bedtime.  . metoprolol tartrate (LOPRESSOR) 25 MG tablet Take 1 tablet (25 mg total) by mouth 2 (two) times daily.  . pantoprazole (PROTONIX) 40 MG tablet TAKE 1 TABLET BY MOUTH EVERYDAY AT BEDTIME (Patient taking differently: Take 40 mg by mouth at bedtime. )  . QUEtiapine (SEROQUEL) 200 MG tablet Take 1 tablet (200 mg total) by mouth 2 (two) times daily.  . tamsulosin (FLOMAX) 0.4 MG CAPS capsule Take 2 capsules (0.8 mg total) by mouth daily with supper.  . collagenase (SANTYL) ointment Apply 1 application topically daily. Measurements right foot  ulcer 2. 0 x 1.0 x 0.3cm (Patient not taking: Reported on 04/23/2020)  . HYDROcodone-acetaminophen (NORCO/VICODIN) 5-325 MG tablet Take 1 tablet by mouth every 4 (four) hours as needed for moderate pain. (Patient not taking: Reported on 04/23/2020)   No facility-administered encounter medications on file as of 04/23/2020.    Allergies (verified) Ace inhibitors, Dairy aid [lactase], Eggs or egg-derived products, and Latex   History: Past Medical History:  Diagnosis Date  . Allergy   . Anxiety   . Arthritis    left neck, shoulder, knee  . CAD (coronary artery disease)    a. anterior STEMI 10/2013 s/p 4V CABG with LIMA to mid LAD, SVG to OM, SVG to PDA, SVG to Diagonal.  . Dementia (HCC)   . Depression   . Falls   . Hypertension   . Ischemic cardiomyopathy    a. EF 40-45% at time of CABG and in 2018.  . MI (myocardial infarction) (HCC)   . Pre-diabetes   . Prediabetes 09/01/2014  . PVD (peripheral vascular disease) (HCC)    a. s/p L SFA stents with now known bilateral SFA occlusion treated medically.  . Stroke [facility])    seen on CT Scan  . Tobacco abuse    Past Surgical History:  Procedure Laterality Date  . ABDOMINAL AORTAGRAM N/A 06/07/2014   Procedure: ABDOMINAL Ronny Flurry;  Surgeon: Iran Ouch, MD;  [facility]: [facility] CATH LAB;  Service: Cardiovascular;  Laterality: N/A;  . ABDOMINAL AORTAGRAM N/A 03/07/2015   Procedure: ABDOMINAL Ronny Flurry;  Surgeon: Iran Ouch, MD;  [facility]: [facility] CATH LAB;  Service: Cardiovascular;  Laterality: N/A;  . ABDOMINAL AORTOGRAM W/LOWER EXTREMITY N/A 01/18/2020   Procedure: ABDOMINAL AORTOGRAM W/LOWER EXTREMITY - Right;  Surgeon: Iran Ouch, MD;  [facility] INVASIVE CV LAB;  Service: Cardiovascular;  Laterality: N/A;  . APPENDECTOMY    . COLONOSCOPY WITH PROPOFOL N/A 10/11/2015   Procedure: COLONOSCOPY WITH PROPOFOL;  Surgeon: Charna Elizabeth, MD;  Location: [facility] ENDOSCOPY;  Service: Endoscopy;  Laterality: N/A;  . CORONARY ARTERY BYPASS GRAFT N/A  11/04/2013   Procedure: CORONARY ARTERY BYPASS GRAFTING (CABG) TIMES FOUR  USING LEFT INTERNAL MAMMARY ARTERY AND RIGHT AND LEFT SAPHENOUS LEG VEIN HARVESTED ENDOSCOPICALLY;  Surgeon: Loreli Slot, MD;  Location: [facility] OR;  Service: Open Heart Surgery;  Laterality: N/A;  . CRANIOTOMY Left 06/30/2019   Procedure: Frontal CRANIOTOMY HEMATOMA EVACUATION SUBDURAL;  Surgeon: Lisbeth Renshaw, MD;  Location: [facility] OR;  Service: Neurosurgery;  Laterality: Left;  Frontal CRANIOTOMY HEMATOMA EVACUATION SUBDURAL  . FEMORAL-POPLITEAL BYPASS GRAFT Right 01/20/2020   Procedure: BYPASS GRAFT FEMORAL-POPLITEAL ARTERY RIGHT LEG USING GORE PROPATEN GRAFT;  Surgeon: Nada Libman, MD;  Location: [facility] OR;  Service: Vascular;  Laterality: Right;  . FINGER SURGERY  2017   injury  . I &  D EXTREMITY Right 03/02/2020   Procedure: IRRIGATION AND DEBRIDEMENT EXTREMITY right  lower leg  with Antibiotic beads.;  Surgeon: Chuck Hint, MD;  Location: [facility] OR;  Service: Vascular;  Laterality: Right;  . KNEE SURGERY     fractured patella  . LEFT HEART CATH N/A 10/29/2013   Procedure: LEFT HEART CATH;  Surgeon: Kathleene Hazel, MD;  Location: [facility] CATH LAB;  Service: Cardiovascular;  Laterality: N/A;  . LEFT HEART CATHETERIZATION WITH CORONARY ANGIOGRAM N/A 10/31/2013   Procedure: LEFT HEART CATHETERIZATION WITH CORONARY ANGIOGRAM;  Surgeon: Kathleene Hazel, MD;  Location: [facility] CATH LAB;  Service: Cardiovascular;  Laterality: N/A;  . MOUTH SURGERY    . TOE SURGERY     Family History  Problem Relation Age of Onset  . CAD Father 67  . AAA (abdominal aortic aneurysm) Father   . Alcohol abuse Father   . Hypertension Father   . Diabetes Father   . Heart attack Father   . Stroke Mother   . Arthritis Mother   . Heart disease Mother   . Hypertension Mother   . Heart attack Mother   . Cardiomyopathy Daughter   . Colon cancer Neg Hx   . Prostate cancer Neg Hx    Social History   Socioeconomic History    . Marital status: Married    Spouse name: [name]  . Number of children: 1  . Years of education: Not on file  . Highest education level: Not on file  Occupational History  . Occupation: long term disability --Event organiser: [employer]  Tobacco Use  . Smoking status: Current Every Day Smoker    Packs/day: 0.25    Years: 30.00    Pack years: 7.50    Types: Cigarettes  . Smokeless tobacco: Never Used  . Tobacco comment: < 1/2 ppd  Substance and Sexual Activity  . Alcohol use: No    Alcohol/week: 0.0 standard drinks  . Drug use: No  . Sexual activity: Yes  Other Topics Concern  . Not on file  Social History Narrative   Household-- pt and wife   1 daughter    Social Determinants of Health   Financial Resource Strain:   . Difficulty of Paying Living Expenses:   Food Insecurity:   . Worried About Programme researcher, broadcasting/film/video in the Last Year:   . Barista in the Last Year:   Transportation Needs:   . Freight forwarder (Medical):   Marland Kitchen Lack of Transportation (Non-Medical):   Physical Activity:   . Days of Exercise per Week:   . Minutes of Exercise per Session:   Stress:   . Feeling of Stress :   Social Connections:   . Frequency of Communication with Friends and Family:   . Frequency of Social Gatherings with Friends and Family:   . Attends Religious Services:   . Active Member of Clubs or Organizations:   . Attends Banker Meetings:   Marland Kitchen Marital Status:    Tobacco Counseling Ready to quit: No Counseling given: No Comment: < 1/2 ppd   Clinical Intake: Pain : No/denies pain       Activities of Daily Living In your present state of health, do you have any difficulty performing the following activities: 04/23/2020 03/05/2020  Hearing? Y -  Vision? Y -  Difficulty concentrating or making decisions? Y -  Walking or climbing stairs? Y -  Dressing or bathing? Y -  Doing errands,  shopping? Y N  Preparing Food and eating ? Y -  Using the  Toilet? Y -  In the past six months, have you accidently leaked urine? Y -  Do you have problems with loss of bowel control? Y -  Managing your Medications? Y -  Managing your Finances? Y -  Housekeeping or managing your Housekeeping? Y -  Some recent data might be hidden     Immunizations and Health Maintenance Immunization History  Administered Date(s) Administered  . Fluad Quad(high Dose 65+) 09/07/2019  . Influenza,inj,Quad PF,6+ Mos 10/02/2015  . Influenza,inj,quad, With Preservative 09/21/2018  . Influenza-Unspecified 10/09/2016, 09/30/2017  . PFIZER SARS-COV-2 Vaccination 02/16/2020, 03/13/2020  . Pneumococcal Conjugate-13 01/02/2015, 09/12/2019  . Pneumococcal Polysaccharide-23 09/01/2014  . Tdap 04/09/2014   There are no preventive care reminders to display for this patient.  Patient Care Team: Wanda PlumpPaz, Jose E, MD as PCP - General (Internal Medicine) Kathleene HazelMcAlhany, Christopher D, MD as PCP - Cardiology (Cardiology) Iran OuchArida, Muhammad A, MD as Consulting Physician (Cardiology) Merwyn KatosPetery, John, DPM as Consulting Physician (Podiatry) Charna ElizabethMann, Jyothi, MD as Consulting Physician (Gastroenterology) Frederico Hammanaffrey, Daniel, MD as Consulting Physician (Orthopedic Surgery)  Indicate any recent Medical Services you may have received from other than [facility] providers in the past year (date may be approximate).    Assessment:   This is a routine wellness examination for Lonnie [name]. Physical assessment deferred to PCP.  Hearing/Vision screen Unable to assess. This visit is enabled though telemedicine due to Covid 19.   Dietary issues and exercise activities discussed: Current Exercise Habits: Home exercise routine, Frequency (Times/Week): 2 Diet (meal preparation, eat out, water intake, caffeinated beverages, dairy products, fruits and vegetables): well balanced      Goals    . LIFESTYLE - DECREASE FALLS RISK      Depression Screen PHQ 2/9 Scores 04/23/2020 12/27/2018 09/09/2018 07/28/2018  PHQ - 2 Score 1 0 0  0  PHQ- 9 Score - 0 2 -    Fall Risk Fall Risk  04/23/2020 06/30/2019 07/28/2018 01/16/2017 07/08/2016  Falls in the past year? 1 1 No No Yes  Number falls in past yr: 1 1 - - 1  Injury with Fall? 1 1 - - Yes  Risk for fall due to : Mental status change;History of fall(s);Impaired balance/gait - - - -  Follow up - - - - Falls evaluation completed;Education provided    Cognitive Function:   MMSE - Mini Mental State Exam 04/23/2020 09/28/2019  Not completed: Unable to complete -  Orientation to time - 4  Orientation to Place - 5  Registration - 3  Attention/ Calculation - 5  Recall - 3  Language- name 2 objects - 2  Language- repeat - 1  Language- follow 3 step command - 3  Language- read & follow direction - 1  Write a sentence - 1  Copy design - 0  Total score - 28        Screening Tests Health Maintenance  Topic Date Due  . INFLUENZA VACCINE  07/22/2020  . PNA vac Low Risk Adult (2 of 2 - PPSV23) 09/11/2020  . TETANUS/TDAP  04/09/2024  . COLONOSCOPY  10/10/2025  . COVID-19 Vaccine  Completed  . Hepatitis C Screening  Completed       Plan:    Please schedule your next medicare wellness visit with me in 1 yr.  Continue to eat heart healthy diet (full of fruits, vegetables, whole grains, lean protein, water--limit salt, fat, and sugar intake) and increase physical  activity as tolerated.  Continue doing brain stimulating activities (puzzles, reading, adult coloring books, staying active) to keep memory sharp.   Bring a copy of your living will and/or healthcare power of attorney to your next office visit.   I have personally reviewed and noted the following in the patient's chart:   . Medical and social history . Use of alcohol, tobacco or illicit drugs  . Current medications and supplements . Functional ability and status . Nutritional status . Physical activity . Advanced directives . List of other physicians . Hospitalizations, surgeries, and ER visits in previous  12 months . Vitals . Screenings to include cognitive, depression, and falls . Referrals and appointments  In addition, I have reviewed and discussed with patient certain preventive protocols, quality metrics, and best practice recommendations. A written personalized care plan for preventive services as well as general preventive health recommendations were provided to patient.     [place]on Gully, [place]   04/23/2020

## 2020-04-26 ENCOUNTER — Other Ambulatory Visit: Payer: Self-pay

## 2020-04-26 ENCOUNTER — Ambulatory Visit (INDEPENDENT_AMBULATORY_CARE_PROVIDER_SITE_OTHER): Payer: Medicare Other | Admitting: Internal Medicine

## 2020-04-26 ENCOUNTER — Encounter: Payer: Self-pay | Admitting: Internal Medicine

## 2020-04-26 VITALS — BP 144/81 | HR 95 | Temp 97.4°F | Resp 18 | Ht 74.0 in | Wt 199.5 lb

## 2020-04-26 DIAGNOSIS — I10 Essential (primary) hypertension: Secondary | ICD-10-CM | POA: Diagnosis not present

## 2020-04-26 DIAGNOSIS — G3109 Other frontotemporal dementia: Secondary | ICD-10-CM | POA: Diagnosis not present

## 2020-04-26 DIAGNOSIS — R739 Hyperglycemia, unspecified: Secondary | ICD-10-CM

## 2020-04-26 DIAGNOSIS — F0280 Dementia in other diseases classified elsewhere without behavioral disturbance: Secondary | ICD-10-CM

## 2020-04-26 DIAGNOSIS — S069X0S Unspecified intracranial injury without loss of consciousness, sequela: Secondary | ICD-10-CM | POA: Diagnosis not present

## 2020-04-26 DIAGNOSIS — R451 Restlessness and agitation: Secondary | ICD-10-CM

## 2020-04-26 LAB — TSH: TSH: 1.28 u[IU]/mL (ref 0.35–4.50)

## 2020-04-26 LAB — LIPID PANEL
Cholesterol: 122 mg/dL (ref 0–200)
HDL: 37.6 mg/dL — ABNORMAL LOW (ref >39.00)
LDL Cholesterol: 52 mg/dL (ref 0–99)
NonHDL: 83.99
Total CHOL/HDL Ratio: 3
Triglycerides: 161 mg/dL — ABNORMAL HIGH (ref 0.0–149.0)
VLDL: 32.2 mg/dL (ref 0.0–40.0)

## 2020-04-26 LAB — URINALYSIS, ROUTINE W REFLEX MICROSCOPIC
Bilirubin Urine: NEGATIVE
Hgb urine dipstick: NEGATIVE
Ketones, ur: NEGATIVE
Leukocytes,Ua: NEGATIVE
Nitrite: NEGATIVE
RBC / HPF: NONE SEEN (ref 0–?)
Specific Gravity, Urine: 1.025 (ref 1.000–1.030)
Total Protein, Urine: NEGATIVE
Urine Glucose: NEGATIVE
Urobilinogen, UA: 0.2 (ref 0.0–1.0)
pH: 6 (ref 5.0–8.0)

## 2020-04-26 LAB — HEMOGLOBIN A1C: Hgb A1c MFr Bld: 5.8 % (ref 4.6–6.5)

## 2020-04-26 LAB — B12 AND FOLATE PANEL
Folate: >23.7 ng/mL (ref >5.9)
Vitamin B-12: 661 pg/mL (ref 211–911)

## 2020-04-26 NOTE — Progress Notes (Signed)
Subjective:    Patient ID: Lonnie [name], male    DOB: 08-02-1951, 69 y.o.   MRN: [id number]  DOS:  04/26/2020 Type of visit - description: Follow-up, here with his wife Anne Hahn Since the last office visit, had 2 surgeries, see assessment and plan.   The patient was brought by the family, he has been increasingly agitated, combative and confused at times. His short term  memory is very poor. He is essentially wheelchair-bound, he only  takes few steps to go smoke outside the house.   Review of Systems No suicidal or homicidal threats but has been trying to be physically violent with the family. No wandering. Continue with imbalance since he had a subdural hematoma but no new falls. No fever chills Appetite is good, bowel movements okay. No nausea or vomiting No major headaches No difficulty urinating   Past Medical History:  Diagnosis Date  . Allergy   . Anxiety   . Arthritis    left neck, shoulder, knee  . CAD (coronary artery disease)    a. anterior STEMI 10/2013 s/p 4V CABG with LIMA to mid LAD, SVG to OM, SVG to PDA, SVG to Diagonal.  . Dementia (HCC)   . Depression   . Falls   . Hypertension   . Ischemic cardiomyopathy    a. EF 40-45% at time of CABG and in 2018.  . MI (myocardial infarction) (HCC)   . Pre-diabetes   . Prediabetes 09/01/2014  . PVD (peripheral vascular disease) (HCC)    a. s/p L SFA stents with now known bilateral SFA occlusion treated medically.  . Stroke [facility])    seen on CT Scan  . Tobacco abuse     Past Surgical History:  Procedure Laterality Date  . ABDOMINAL AORTAGRAM N/A 06/07/2014   Procedure: ABDOMINAL Ronny Flurry;  Surgeon: Iran Ouch, MD;  [facility]: [facility] CATH LAB;  Service: Cardiovascular;  Laterality: N/A;  . ABDOMINAL AORTAGRAM N/A 03/07/2015   Procedure: ABDOMINAL Ronny Flurry;  Surgeon: Iran Ouch, MD;  [facility]: [facility] CATH LAB;  Service: Cardiovascular;  Laterality: N/A;  . ABDOMINAL AORTOGRAM W/LOWER EXTREMITY N/A  01/18/2020   Procedure: ABDOMINAL AORTOGRAM W/LOWER EXTREMITY - Right;  Surgeon: Iran Ouch, MD;  [facility] INVASIVE CV LAB;  Service: Cardiovascular;  Laterality: N/A;  . APPENDECTOMY    . COLONOSCOPY WITH PROPOFOL N/A 10/11/2015   Procedure: COLONOSCOPY WITH PROPOFOL;  Surgeon: Charna Elizabeth, MD;  [facility] ENDOSCOPY;  Service: Endoscopy;  Laterality: N/A;  . CORONARY ARTERY BYPASS GRAFT N/A 11/04/2013   Procedure: CORONARY ARTERY BYPASS GRAFTING (CABG) TIMES FOUR  USING LEFT INTERNAL MAMMARY ARTERY AND RIGHT AND LEFT SAPHENOUS LEG VEIN HARVESTED ENDOSCOPICALLY;  Surgeon: Loreli Slot, MD;  Location: [facility] OR;  Service: Open Heart Surgery;  Laterality: N/A;  . CRANIOTOMY Left 06/30/2019   Procedure: Frontal CRANIOTOMY HEMATOMA EVACUATION SUBDURAL;  Surgeon: Lisbeth Renshaw, MD;  Location: [facility] OR;  Service: Neurosurgery;  Laterality: Left;  Frontal CRANIOTOMY HEMATOMA EVACUATION SUBDURAL  . FEMORAL-POPLITEAL BYPASS GRAFT Right 01/20/2020   Procedure: BYPASS GRAFT FEMORAL-POPLITEAL ARTERY RIGHT LEG USING GORE PROPATEN GRAFT;  Surgeon: Nada Libman, MD;  Location: [facility] OR;  Service: Vascular;  Laterality: Right;  . FINGER SURGERY  2017   injury  . I & D EXTREMITY Right 03/02/2020   Procedure: IRRIGATION AND DEBRIDEMENT EXTREMITY right  lower leg  with Antibiotic beads.;  Surgeon: Chuck Hint, MD;  Location: [facility] OR;  Service: Vascular;  Laterality: Right;  . KNEE SURGERY  fractured patella  . LEFT HEART CATH N/A 10/29/2013   Procedure: LEFT HEART CATH;  Surgeon: Burnell Blanks, MD;  Location: [facility] CATH LAB;  Service: Cardiovascular;  Laterality: N/A;  . LEFT HEART CATHETERIZATION WITH CORONARY ANGIOGRAM N/A 10/31/2013   Procedure: LEFT HEART CATHETERIZATION WITH CORONARY ANGIOGRAM;  Surgeon: Burnell Blanks, MD;  Location: [facility] CATH LAB;  Service: Cardiovascular;  Laterality: N/A;  . MOUTH SURGERY    . TOE SURGERY      Allergies as of 04/26/2020       Reactions   Ace Inhibitors Swelling   Angioedema   Dairy Aid [lactase]    gas   Eggs Or Egg-derived Products    Cannot eat Prepared Eggs   Latex Itching      Medication List       Accurate as of Apr 26, 2020 11:59 PM. If you have any questions, ask your nurse or doctor.        STOP taking these medications   Santyl ointment Generic drug: collagenase Stopped by: Kathlene November, MD     TAKE these medications   acetaminophen 325 MG tablet Commonly known as: TYLENOL Take 2 tablets (650 mg total) by mouth 4 (four) times daily -  before meals and at bedtime. What changed:   when to take this  reasons to take this   aspirin EC 81 MG tablet Take 81 mg by mouth daily.   atorvastatin 80 MG tablet Commonly known as: LIPITOR TAKE 1 TABLET BY MOUTH  DAILY AT 6 PM What changed:   how much to take  how to take this  when to take this   Calcium 600+D 600-800 MG-UNIT Tabs Generic drug: Calcium Carb-Cholecalciferol Take 1 tablet by mouth daily.   clonazePAM 0.25 MG disintegrating tablet Commonly known as: KLONOPIN TAKE 1 TABLET BY MOUTH 2 TIMES DAILY AS NEEDED.   clopidogrel 75 MG tablet Commonly known as: PLAVIX Take 1 tablet (75 mg total) by mouth daily.   fluticasone 50 MCG/ACT nasal spray Commonly known as: FLONASE Place 2 sprays into both nostrils daily. What changed:   when to take this  reasons to take this   gabapentin 100 MG capsule Commonly known as: NEURONTIN Take 1 capsule (100 mg total) by mouth 2 (two) times daily.   HYDROcodone-acetaminophen 5-325 MG tablet Commonly known as: NORCO/VICODIN Take 1 tablet by mouth every 4 (four) hours as needed for moderate pain.   irbesartan 300 MG tablet Commonly known as: AVAPRO Take 1 tablet (300 mg total) by mouth daily.   loratadine 10 MG tablet Commonly known as: CLARITIN Take 1 tablet (10 mg total) by mouth daily.   lovastatin 40 MG tablet Commonly known as: MEVACOR Take 80 mg by mouth at bedtime.     melatonin 3 MG Tabs tablet Take 1 tablet by mouth at bedtime.   metoprolol tartrate 25 MG tablet Commonly known as: LOPRESSOR Take 1 tablet (25 mg total) by mouth 2 (two) times daily.   pantoprazole 40 MG tablet Commonly known as: PROTONIX TAKE 1 TABLET BY MOUTH EVERYDAY AT BEDTIME What changed: See the new instructions.   QUEtiapine 200 MG tablet Commonly known as: SEROQUEL Take 1 tablet (200 mg total) by mouth 2 (two) times daily.   tamsulosin 0.4 MG Caps capsule Commonly known as: FLOMAX Take 2 capsules (0.8 mg total) by mouth daily with supper.          Objective:   Physical Exam BP (!) 144/81 (BP Location: Left Arm, Patient  Position: Sitting, Cuff Size: Normal)   Pulse 95   Temp (!) 97.4 F (36.3 C) (Temporal)   Resp 18   Ht 6\' 2"  (1.88 m)   Wt 199 lb 8 oz (90.5 kg)   SpO2 98%   BMI 25.61 kg/m  General:   Well developed, NAD, BMI noted. HEENT:  Normocephalic . Face symmetric, atraumatic Lungs:  CTA B Normal respiratory effort, no intercostal retractions, no accessory muscle use. Heart: RRR,  no murmur.  Lower extremities: no pretibial edema bilaterally.  Right leg with postop wounds cover it. Skin: Not pale. Not jaundice Neurologic:  alert, normal attention, he was belligerent with me "I am here because of you". Speech normal, he was able to answer all my questions.  Gait: Not tested Psych--  Upset appearing      Assessment    Assessment Prediabetes  dx 08-2014 HTN Hyperlipidemia Psych Anxiety: per pt, see OV 09/10/2019 Frontotemporal dementia dx 06/2019 but sx started gradually long before  CV: --CAD, MI , cath 10-2013 ---> CABG --Ischemic cardiomyopathy --Peripheral vascular disease Tobacco abuse -- intolerant to chantix before, tried Wellbutrin 2016 (not much help) Onychomycosis-- rx lamisil per podiatry 12-2015 Subdural hematoma, s/p craniotomy: admited 06/2019     PLAN: Prediabetes: Check A1c HTN: Seems controlled, no  change. Hyperlipidemia: Check a FLP Frontotemporal dementia, anxiety w/ behavioral issues: Continue to behave agitated, combative.  Already on Seroquel and clonazepam. The patient lives  at home with his wife.  His daughter 07/2019 is quite involved.   Both are their medical power of attorney.   He has been very clear that he would rather die than going back to the hospital and if he is put on a nursing home he will not be cooperative. I asked if he has been a threat to himself and the answer is no but he has "tried to swing" at the daughter and wife. We talk about possibly use of antipsychotics with the caveat that they can shorten the life of a patient and the wife stated that she is okay with that if deemed necessary. Addendum: I informally discussed with our clinical pharmacist and neurology.  Options include add Depakote ER 500 mg or Citalopram or Risperdal (we will have to consult with our clinical pharmacist if it is okay to add to Seroquel). I am electing to start citalopram.  Will notify the family. For completeness we will check RPR, B12, folic acid, TSH.  Also a UA urine culture Peripheral vascular disease: Since the last visit 12-2019, developed a right foot ulcer, eventually saw vascular surgery, had a right femoral to below-knee popliteal bypass 01/20/2020. Subsequently, developed cellulitis of the right leg and had a exploration of the infection on 03/02/2020.   Social: RTC 3 months  Time spent today 50 minutes, talking to the patient's wife, provided listening therapy, coordinating his care. This visit occurred during the SARS-CoV-2 public health emergency.  Safety protocols were in place, including screening questions prior to the visit, additional usage of staff PPE, and extensive cleaning of exam room while observing appropriate contact time as indicated for disinfecting solutions.

## 2020-04-26 NOTE — Progress Notes (Signed)
Pre visit review using our clinic review tool, if applicable. No additional management support is needed unless otherwise documented below in the visit note. 

## 2020-04-26 NOTE — Patient Instructions (Addendum)
°  GO TO THE LAB : Get the blood work   ° ° °GO TO THE FRONT DESK, PLEASE SCHEDULE YOUR APPOINTMENTS °Come back for a checkup in 3 months °

## 2020-04-27 ENCOUNTER — Ambulatory Visit: Payer: Medicare Other | Admitting: Internal Medicine

## 2020-04-27 LAB — URINE CULTURE
MICRO NUMBER:: 10447262
Result:: NO GROWTH
SPECIMEN QUALITY:: ADEQUATE

## 2020-04-27 LAB — RPR: RPR Ser Ql: NONREACTIVE

## 2020-04-28 MED ORDER — CITALOPRAM HYDROBROMIDE 20 MG PO TABS: ORAL_TABLET | ORAL | 3 refills | Status: DC

## 2020-04-28 NOTE — Assessment & Plan Note (Signed)
Prediabetes: Check A1c HTN: Seems controlled, no change. Hyperlipidemia: Check a FLP Frontotemporal dementia, anxiety w/ behavioral issues: Continue to behave agitated, combative.  Already on Seroquel and clonazepam. The patient lives  at home with his wife.  [name] is quite involved.   Both are their medical power of attorney.   He has been very clear that he would rather die than going back to the hospital and if he is put on a nursing home he will not be cooperative. I asked if he has been a threat to himself and the answer is no but he has "tried to swing" at the daughter and wife. We talk about possibly use of antipsychotics with the caveat that they can shorten the life of a patient and the wife stated that she is okay with that if deemed necessary. Addendum: I informally discussed with our clinical pharmacist and neurology.  Options include add Depakote ER 500 mg or Citalopram or Risperdal (we will have to consult with our clinical pharmacist if it is okay to add to Seroquel). I am electing to start citalopram.  Will notify the family. For completeness we will check RPR, B12, folic acid, TSH.  Also a UA urine culture Peripheral vascular disease: Since the last visit 12-2019, developed a right foot ulcer, eventually saw vascular surgery, had a right femoral to below-knee popliteal bypass 01/20/2020. Subsequently, developed cellulitis of the right leg and had a exploration of the infection on 03/02/2020.   Social: RTC 3 months

## 2020-05-14 ENCOUNTER — Ambulatory Visit: Payer: Medicare Other | Admitting: Surgery

## 2020-05-14 ENCOUNTER — Encounter (HOSPITAL_COMMUNITY): Payer: Medicare Other

## 2020-05-14 ENCOUNTER — Inpatient Hospital Stay (HOSPITAL_COMMUNITY): Admission: RE | Admit: 2020-05-14 | Payer: Medicare Other | Source: Ambulatory Visit

## 2020-05-21 ENCOUNTER — Other Ambulatory Visit: Payer: Self-pay | Admitting: Internal Medicine

## 2020-06-04 ENCOUNTER — Telehealth: Payer: Self-pay | Admitting: Internal Medicine

## 2020-06-05 NOTE — Telephone Encounter (Signed)
Clonazepam refill.   Last OV: 04/26/2020 Last Fill: 04/09/2020 #60 and 1RF Pt sig: 1 tab bid prn UDS: None

## 2020-06-05 NOTE — Telephone Encounter (Signed)
PDMP okay, it is a slightly early but will go ahead and send a prescription.

## 2020-06-15 ENCOUNTER — Other Ambulatory Visit: Payer: Self-pay | Admitting: Internal Medicine

## 2020-06-21 ENCOUNTER — Telehealth: Payer: Self-pay | Admitting: *Deleted

## 2020-06-21 NOTE — Telephone Encounter (Signed)
Mr.Cortright's wife will call back to schedule his follow up visit.

## 2020-06-23 ENCOUNTER — Other Ambulatory Visit: Payer: Self-pay | Admitting: Internal Medicine

## 2020-06-26 ENCOUNTER — Other Ambulatory Visit: Payer: Self-pay | Admitting: Internal Medicine

## 2020-07-02 ENCOUNTER — Telehealth: Payer: Self-pay

## 2020-07-02 NOTE — Telephone Encounter (Signed)
05/23/20: Palliative volunteer check in call attempt, no answer 

## 2020-07-05 ENCOUNTER — Telehealth: Payer: Self-pay | Admitting: Cardiovascular Disease

## 2020-07-05 NOTE — Telephone Encounter (Signed)
Spoke with patients wife regarding follow up visit from [place] recall list, states patient is experiencing early stage dementia from frontal lobe damage and decided he no longer wants to be seen by any provider unless it is an emergency

## 2020-07-13 ENCOUNTER — Telehealth: Payer: Self-pay | Admitting: Internal Medicine

## 2020-07-13 NOTE — Telephone Encounter (Signed)
Last office visit- 04-26-2020 Last refill 06-05-2020 Next office visit- 07-27-2020

## 2020-07-16 NOTE — Telephone Encounter (Signed)
TCM okay, refilled clonazepam

## 2020-07-17 ENCOUNTER — Telehealth: Payer: Self-pay | Admitting: *Deleted

## 2020-07-17 MED ORDER — ATORVASTATIN CALCIUM 80 MG PO TABS: ORAL_TABLET | ORAL | 1 refills | Status: DC

## 2020-07-17 MED ORDER — CITALOPRAM HYDROBROMIDE 20 MG PO TABS: 20.0000 mg | ORAL_TABLET | Freq: Every day | ORAL | 1 refills | Status: DC

## 2020-07-17 MED ORDER — GABAPENTIN 100 MG PO CAPS: 100.0000 mg | ORAL_CAPSULE | Freq: Two times a day (BID) | ORAL | 1 refills | Status: DC

## 2020-07-17 MED ORDER — QUETIAPINE FUMARATE 200 MG PO TABS: 200.0000 mg | ORAL_TABLET | Freq: Two times a day (BID) | ORAL | 1 refills | Status: DC

## 2020-07-17 MED ORDER — CLONAZEPAM 0.25 MG PO TBDP: 0.2500 mg | ORAL_TABLET | Freq: Two times a day (BID) | ORAL | 0 refills | Status: DC | PRN

## 2020-07-17 MED ORDER — METOPROLOL TARTRATE 25 MG PO TABS: 25.0000 mg | ORAL_TABLET | Freq: Two times a day (BID) | ORAL | 1 refills | Status: DC

## 2020-07-17 MED ORDER — PANTOPRAZOLE SODIUM 40 MG PO TBEC: 40.0000 mg | DELAYED_RELEASE_TABLET | Freq: Every day | ORAL | 1 refills | Status: DC

## 2020-07-17 MED ORDER — TAMSULOSIN HCL 0.4 MG PO CAPS: 0.8000 mg | ORAL_CAPSULE | Freq: Every day | ORAL | 1 refills | Status: DC

## 2020-07-17 MED ORDER — IRBESARTAN 300 MG PO TABS: 300.0000 mg | ORAL_TABLET | Freq: Every day | ORAL | 1 refills | Status: DC

## 2020-07-17 NOTE — Telephone Encounter (Signed)
Received fax from [facility] requesting new prescriptions for:  Pantoprazole, metoprolol, tamsulosin, irbesartan, citalopram, quetiapine, gabapentin, atorvasttin and clonazepam.  Rxs have been sent. Clonazepam pended per Provider review / approval.  Last Clonazepam RX:  07/16/20, # 60 x 1 refill to [facility].  Pt is wanting to get through mail order Last OV:   04/26/20 Next OV:  07/27/20 UDS:  Not on file CSC: not on file

## 2020-07-17 NOTE — Telephone Encounter (Signed)
PDMP okay, Rx sent 

## 2020-07-27 ENCOUNTER — Encounter: Payer: Self-pay | Admitting: Internal Medicine

## 2020-07-27 ENCOUNTER — Telehealth (INDEPENDENT_AMBULATORY_CARE_PROVIDER_SITE_OTHER): Payer: Medicare Other | Admitting: Internal Medicine

## 2020-07-27 ENCOUNTER — Other Ambulatory Visit: Payer: Self-pay

## 2020-07-27 VITALS — BP 129/71 | HR 56 | Ht 74.0 in | Wt 210.0 lb

## 2020-07-27 DIAGNOSIS — R296 Repeated falls: Secondary | ICD-10-CM | POA: Diagnosis not present

## 2020-07-27 DIAGNOSIS — G3109 Other frontotemporal dementia: Secondary | ICD-10-CM | POA: Diagnosis not present

## 2020-07-27 DIAGNOSIS — F0280 Dementia in other diseases classified elsewhere without behavioral disturbance: Secondary | ICD-10-CM | POA: Diagnosis not present

## 2020-07-27 DIAGNOSIS — R451 Restlessness and agitation: Secondary | ICD-10-CM | POA: Diagnosis not present

## 2020-07-27 DIAGNOSIS — R531 Weakness: Secondary | ICD-10-CM

## 2020-07-27 DIAGNOSIS — S069X0S Unspecified intracranial injury without loss of consciousness, sequela: Secondary | ICD-10-CM

## 2020-07-27 NOTE — Progress Notes (Signed)
Subjective:    Patient ID: Lonnie [name], male    DOB: [birth year], 69 y.o.   MRN: [id number]  DOS:  07/27/2020 Type of visit - description: Virtual Visit via Video Note  I connected with the above patient  by a video enabled telemedicine application and verified that I am speaking with the correct person using two identifiers.   THIS ENCOUNTER IS A VIRTUAL VISIT DUE TO COVID-19 - PATIENT WAS NOT SEEN IN THE OFFICE. PATIENT HAS CONSENTED TO VIRTUAL VISIT / TELEMEDICINE VISIT   Location of patient: home  Location of provider: office  Persons participating in the virtual visit: patient, provider   I discussed the limitations of evaluation and management by telemedicine and the availability of in person appointments. The patient expressed understanding and agreed to proceed.  Follow-up This is a virtual follow-up, most of the information was obtained from the daughter [name]. Overall she feels her father is stable. Dementia is ongoing, repeat things multiple times, "circular conversations". She is still aggressive at times. He continues smoking outside the house, not wandering. As far as his mobility, he can move around the house using a walker. Had a fall 2 days ago, fortunately only bruised the right elbow.  It was a mechanical fall with no LOC.  Denies current neck or hip pain.    Review of Systems See above   Past Medical History:  Diagnosis Date  . Allergy   . Anxiety   . Arthritis    left neck, shoulder, knee  . CAD (coronary artery disease)    a. anterior STEMI 10/2013 s/p 4V CABG with LIMA to mid LAD, SVG to OM, SVG to PDA, SVG to Diagonal.  . Dementia (HCC)   . Depression   . Falls   . Hypertension   . Ischemic cardiomyopathy    a. EF 40-45% at time of CABG and in 2018.  . MI (myocardial infarction) (HCC)   . Pre-diabetes   . Prediabetes 09/01/2014  . PVD (peripheral vascular disease) (HCC)    a. s/p L SFA stents with now known bilateral SFA occlusion  treated medically.  . Stroke [facility])    seen on CT Scan  . Tobacco abuse     Past Surgical History:  Procedure Laterality Date  . ABDOMINAL AORTAGRAM N/A 06/07/2014   Procedure: ABDOMINAL Ronny Flurry;  Surgeon: Iran Ouch, MD;  Location: [facility] CATH LAB;  Service: Cardiovascular;  Laterality: N/A;  . ABDOMINAL AORTAGRAM N/A 03/07/2015   Procedure: ABDOMINAL Ronny Flurry;  Surgeon: Iran Ouch, MD;  Location: [facility] CATH LAB;  Service: Cardiovascular;  Laterality: N/A;  . ABDOMINAL AORTOGRAM W/LOWER EXTREMITY N/A 01/18/2020   Procedure: ABDOMINAL AORTOGRAM W/LOWER EXTREMITY - Right;  Surgeon: Iran Ouch, MD;  Location: [facility] INVASIVE CV LAB;  Service: Cardiovascular;  Laterality: N/A;  . APPENDECTOMY    . COLONOSCOPY WITH PROPOFOL N/A 10/11/2015   Procedure: COLONOSCOPY WITH PROPOFOL;  Surgeon: Charna Elizabeth, MD;  Location: [facility] ENDOSCOPY;  Service: Endoscopy;  Laterality: N/A;  . CORONARY ARTERY BYPASS GRAFT N/A 11/04/2013   Procedure: CORONARY ARTERY BYPASS GRAFTING (CABG) TIMES FOUR  USING LEFT INTERNAL MAMMARY ARTERY AND RIGHT AND LEFT SAPHENOUS LEG VEIN HARVESTED ENDOSCOPICALLY;  Surgeon: Loreli Slot, MD;  [facility];  Service: Open Heart Surgery;  Laterality: N/A;  . CRANIOTOMY Left 06/30/2019   Procedure: Frontal CRANIOTOMY HEMATOMA EVACUATION SUBDURAL;  Surgeon: Lisbeth Renshaw, MD;  Location: [facility] OR;  Service: Neurosurgery;  Laterality: Left;  Frontal CRANIOTOMY HEMATOMA EVACUATION SUBDURAL  .  FEMORAL-POPLITEAL BYPASS GRAFT Right 01/20/2020   Procedure: BYPASS GRAFT FEMORAL-POPLITEAL ARTERY RIGHT LEG USING GORE PROPATEN GRAFT;  Surgeon: Nada Libman, MD;  Location: [facility] OR;  Service: Vascular;  Laterality: Right;  . FINGER SURGERY  2017   injury  . I & D EXTREMITY Right 03/02/2020   Procedure: IRRIGATION AND DEBRIDEMENT EXTREMITY right  lower leg  with Antibiotic beads.;  Surgeon: Chuck Hint, MD;  Location: [facility] OR;  Service: Vascular;  Laterality: Right;  . KNEE  SURGERY     fractured patella  . LEFT HEART CATH N/A 10/29/2013   Procedure: LEFT HEART CATH;  Surgeon: Kathleene Hazel, MD;  Location: [facility] CATH LAB;  Service: Cardiovascular;  Laterality: N/A;  . LEFT HEART CATHETERIZATION WITH CORONARY ANGIOGRAM N/A 10/31/2013   Procedure: LEFT HEART CATHETERIZATION WITH CORONARY ANGIOGRAM;  Surgeon: Kathleene Hazel, MD;  Location: [facility] CATH LAB;  Service: Cardiovascular;  Laterality: N/A;  . MOUTH SURGERY    . TOE SURGERY      Allergies as of 07/27/2020      Reactions   Ace Inhibitors Swelling   Angioedema   Dairy Aid [lactase]    gas   Eggs Or Egg-derived Products    Cannot eat Prepared Eggs   Latex Itching      Medication List       Accurate as of July 27, 2020 11:59 PM. If you have any questions, ask your nurse or doctor.        STOP taking these medications   lovastatin 40 MG tablet Commonly known as: MEVACOR Stopped by: Willow Ora, MD     TAKE these medications   acetaminophen 325 MG tablet Commonly known as: TYLENOL Take 2 tablets (650 mg total) by mouth 4 (four) times daily -  before meals and at bedtime. What changed:   when to take this  reasons to take this   aspirin EC 81 MG tablet Take 81 mg by mouth daily.   atorvastatin 80 MG tablet Commonly known as: LIPITOR TAKE 1 TABLET BY MOUTH  DAILY AT 6 PM   Calcium 600+D 600-800 MG-UNIT Tabs Generic drug: Calcium Carb-Cholecalciferol Take 1 tablet by mouth daily.   citalopram 20 MG tablet Commonly known as: CELEXA Take 1 tablet (20 mg total) by mouth daily.   clonazePAM 0.25 MG disintegrating tablet Commonly known as: KLONOPIN Take 1 tablet (0.25 mg total) by mouth 2 (two) times daily as needed.   clopidogrel 75 MG tablet Commonly known as: PLAVIX Take 1 tablet (75 mg total) by mouth daily.   fluticasone 50 MCG/ACT nasal spray Commonly known as: FLONASE Place 2 sprays into both nostrils daily. What changed:   when to take this  reasons to take  this   gabapentin 100 MG capsule Commonly known as: NEURONTIN Take 1 capsule (100 mg total) by mouth 2 (two) times daily.   HYDROcodone-acetaminophen 5-325 MG tablet Commonly known as: NORCO/VICODIN Take 1 tablet by mouth every 4 (four) hours as needed for moderate pain.   irbesartan 300 MG tablet Commonly known as: AVAPRO Take 1 tablet (300 mg total) by mouth daily.   loratadine 10 MG tablet Commonly known as: CLARITIN Take 1 tablet (10 mg total) by mouth daily.   melatonin 3 MG Tabs tablet Take 1 tablet by mouth at bedtime.   metoprolol tartrate 25 MG tablet Commonly known as: LOPRESSOR Take 1 tablet (25 mg total) by mouth 2 (two) times daily.   pantoprazole 40 MG tablet Commonly known as: PROTONIX  Take 1 tablet (40 mg total) by mouth at bedtime.   QUEtiapine 200 MG tablet Commonly known as: SEROQUEL Take 1 tablet (200 mg total) by mouth 2 (two) times daily.   tamsulosin 0.4 MG Caps capsule Commonly known as: FLOMAX Take 2 capsules (0.8 mg total) by mouth daily with supper.          Objective:   Physical Exam BP 129/71   Pulse (!) 56   Ht 6\' 2"  (1.88 m)   Wt 210 lb (95.3 kg)   BMI 26.96 kg/m  This is a virtual video visit, the patient is sitting on his sofa, he is alert on time, self and space.  He remembers me, recognize the daughter, repeated things several times.    Assessment     Assessment Prediabetes  dx 08-2014 HTN Hyperlipidemia Psych Anxiety: per pt, see OV 09/10/2019 Frontotemporal dementia dx 06/2019 but sx started gradually long before  CV: --CAD, MI , cath 10-2013 ---> CABG --Ischemic cardiomyopathy --Peripheral vascular disease Tobacco abuse -- intolerant to chantix before, tried Wellbutrin 2016 (not much help) Onychomycosis-- rx lamisil per podiatry 12-2015 Subdural hematoma, s/p craniotomy: admited 06/2019     PLAN: Frontotemporal dementia, anxiety with behavioral issues: He is on Seroquel, clonazepam, and the last visit he was a  started on citalopram.  Is only in the last couple of weeks that the dose was increased to a full tablet. Overall things are stable, he continue with episodes of aggressiveness.  Both the wife and the daughter feel safe patient. At this point we agreed on no change. Fall: The last time he got physical therapy few months ago it was very beneficial to him, he recently had a fall, will send PT again. Pain management: On gabapentin for lower extremity pain which he had since leg surgery. RTC 3 months.    I discussed the assessment and treatment plan with the patient. The patient was provided an opportunity to ask questions and all were answered. The patient agreed with the plan and demonstrated an understanding of the instructions.   The patient was advised to call back or seek an in-person evaluation if the symptoms worsen or if the condition fails to improve as anticipated.

## 2020-07-27 NOTE — Progress Notes (Signed)
Pre visit review using our clinic review tool, if applicable. No additional management support is needed unless otherwise documented below in the visit note. 

## 2020-07-28 NOTE — Assessment & Plan Note (Signed)
Frontotemporal dementia, anxiety with behavioral issues: He is on Seroquel, clonazepam, and the last visit he was a started on citalopram.  Is only in the last couple of weeks that the dose was increased to a full tablet. Overall things are stable, he continue with episodes of aggressiveness.  Both the wife and the daughter feel safe patient. At this point we agreed on no change. Fall: The last time he got physical therapy few months ago it was very beneficial to him, he recently had a fall, will send PT again. Pain management: On gabapentin for lower extremity pain which he had since leg surgery. RTC 3 months.

## 2020-07-31 ENCOUNTER — Telehealth: Payer: Self-pay | Admitting: Internal Medicine

## 2020-07-31 NOTE — Telephone Encounter (Signed)
Lonnie [name], [facility]  [phone]    Verbal order  Needed for home health PT   1 week 9

## 2020-08-01 NOTE — Telephone Encounter (Signed)
Yes please

## 2020-08-01 NOTE — Telephone Encounter (Signed)
Is it okay to give VO ? 

## 2020-08-01 NOTE — Telephone Encounter (Signed)
Verbal order for Home health given

## 2020-08-08 ENCOUNTER — Telehealth: Payer: Self-pay

## 2020-08-08 DIAGNOSIS — I2510 Atherosclerotic heart disease of native coronary artery without angina pectoris: Secondary | ICD-10-CM

## 2020-08-08 DIAGNOSIS — Z9861 Coronary angioplasty status: Secondary | ICD-10-CM

## 2020-08-08 DIAGNOSIS — I255 Ischemic cardiomyopathy: Secondary | ICD-10-CM

## 2020-08-08 DIAGNOSIS — F0281 Dementia in other diseases classified elsewhere with behavioral disturbance: Secondary | ICD-10-CM

## 2020-08-08 DIAGNOSIS — Z95820 Peripheral vascular angioplasty status with implants and grafts: Secondary | ICD-10-CM

## 2020-08-08 DIAGNOSIS — Z9181 History of falling: Secondary | ICD-10-CM

## 2020-08-08 DIAGNOSIS — Z7982 Long term (current) use of aspirin: Secondary | ICD-10-CM

## 2020-08-08 DIAGNOSIS — S069X0S Unspecified intracranial injury without loss of consciousness, sequela: Secondary | ICD-10-CM

## 2020-08-08 DIAGNOSIS — R7303 Prediabetes: Secondary | ICD-10-CM

## 2020-08-08 DIAGNOSIS — I252 Old myocardial infarction: Secondary | ICD-10-CM

## 2020-08-08 DIAGNOSIS — I10 Essential (primary) hypertension: Secondary | ICD-10-CM

## 2020-08-08 DIAGNOSIS — F419 Anxiety disorder, unspecified: Secondary | ICD-10-CM

## 2020-08-08 DIAGNOSIS — Z79899 Other long term (current) drug therapy: Secondary | ICD-10-CM

## 2020-08-08 DIAGNOSIS — G3109 Other frontotemporal dementia: Secondary | ICD-10-CM

## 2020-08-08 DIAGNOSIS — E785 Hyperlipidemia, unspecified: Secondary | ICD-10-CM

## 2020-08-08 DIAGNOSIS — Z951 Presence of aortocoronary bypass graft: Secondary | ICD-10-CM

## 2020-08-08 DIAGNOSIS — R531 Weakness: Secondary | ICD-10-CM

## 2020-08-08 DIAGNOSIS — F17210 Nicotine dependence, cigarettes, uncomplicated: Secondary | ICD-10-CM

## 2020-08-08 DIAGNOSIS — Z7902 Long term (current) use of antithrombotics/antiplatelets: Secondary | ICD-10-CM

## 2020-08-08 DIAGNOSIS — R296 Repeated falls: Secondary | ICD-10-CM | POA: Diagnosis not present

## 2020-08-08 DIAGNOSIS — I739 Peripheral vascular disease, unspecified: Secondary | ICD-10-CM

## 2020-08-08 DIAGNOSIS — N529 Male erectile dysfunction, unspecified: Secondary | ICD-10-CM

## 2020-08-08 DIAGNOSIS — Z8673 Personal history of transient ischemic attack (TIA), and cerebral infarction without residual deficits: Secondary | ICD-10-CM

## 2020-08-08 DIAGNOSIS — R339 Retention of urine, unspecified: Secondary | ICD-10-CM

## 2020-08-08 DIAGNOSIS — M1990 Unspecified osteoarthritis, unspecified site: Secondary | ICD-10-CM

## 2020-08-08 NOTE — Telephone Encounter (Signed)
Plan of care signed and faxed back to [facility] at [phone]. Form sent for scanning.  

## 2020-08-21 ENCOUNTER — Telehealth: Payer: Medicare Other | Admitting: Family Medicine

## 2020-08-21 ENCOUNTER — Other Ambulatory Visit: Payer: Self-pay

## 2020-08-28 ENCOUNTER — Other Ambulatory Visit: Payer: Self-pay

## 2020-08-28 ENCOUNTER — Telehealth (INDEPENDENT_AMBULATORY_CARE_PROVIDER_SITE_OTHER): Payer: Medicare Other | Admitting: Internal Medicine

## 2020-08-28 ENCOUNTER — Encounter: Payer: Self-pay | Admitting: Internal Medicine

## 2020-08-28 VITALS — Ht 74.0 in

## 2020-08-28 DIAGNOSIS — N390 Urinary tract infection, site not specified: Secondary | ICD-10-CM

## 2020-08-28 MED ORDER — SULFAMETHOXAZOLE-TRIMETHOPRIM 800-160 MG PO TABS: 1.0000 | ORAL_TABLET | Freq: Two times a day (BID) | ORAL | 0 refills | Status: DC

## 2020-08-28 NOTE — Progress Notes (Signed)
Pre visit review using our clinic review tool, if applicable. No additional management support is needed unless otherwise documented below in the visit note. 

## 2020-08-28 NOTE — Progress Notes (Signed)
Subjective:    Patient ID: Lonnie [name], male    DOB: October 25, 1951, 69 y.o.   MRN: [id number]  DOS:  08/28/2020 Type of visit - description:    Virtual Visit via Telephone    I connected with above mentioned patient  by telephone and verified that I am speaking with the correct person using two identifiers.  THIS ENCOUNTER IS A VIRTUAL VISIT DUE TO COVID-19 - PATIENT WAS NOT SEEN IN THE OFFICE. PATIENT HAS CONSENTED TO VIRTUAL VISIT / TELEMEDICINE VISIT   Location of patient: home  Location of provider: office  Persons participating in the virtual visit: patient, provider   I discussed the limitations, risks, security and privacy concerns of performing an evaluation and management service by telephone and the availability of in person appointments. I also discussed with the patient that there may be a patient responsible charge related to this service. The patient expressed understanding and agreed to proceed.  Acute I spoke with the patient and the daughter [name]. They felt that Baylen was having a UTI recently: Change in the color of the urine, it was dark almost with a tinge of blood. He also have some lower abdominal discomfort. They felt that he was more confused than usual.  They were able to reach a "doctor on demand", a virtual service, the doctor agreed to prescribe Bactrim x5 days suspecting this was a UTI and advised him to contact me in 5 days.  Since then he has improved. They like to know if it is appropriate to take antibiotics for few additional days.  Review of Systems No recent fever chills No nausea, vomiting, diarrhea He had some abdominal pain: Improved No recent dysuria Mental status is back to normal.   Past Medical History:  Diagnosis Date  . Allergy   . Anxiety   . Arthritis    left neck, shoulder, knee  . CAD (coronary artery disease)    a. anterior STEMI 10/2013 s/p 4V CABG with LIMA to mid LAD, SVG to OM, SVG to PDA, SVG to Diagonal.   . Dementia (HCC)   . Depression   . Falls   . Hypertension   . Ischemic cardiomyopathy    a. EF 40-45% at time of CABG and in 2018.  . MI (myocardial infarction) (HCC)   . Pre-diabetes   . Prediabetes 09/01/2014  . PVD (peripheral vascular disease) (HCC)    a. s/p L SFA stents with now known bilateral SFA occlusion treated medically.  . Stroke [facility])    seen on CT Scan  . Tobacco abuse     Past Surgical History:  Procedure Laterality Date  . ABDOMINAL AORTAGRAM N/A 06/07/2014   Procedure: ABDOMINAL Ronny Flurry;  Surgeon: Iran Ouch, MD;  Location: [facility] CATH LAB;  Service: Cardiovascular;  Laterality: N/A;  . ABDOMINAL AORTAGRAM N/A 03/07/2015   Procedure: ABDOMINAL Ronny Flurry;  Surgeon: Iran Ouch, MD;  Location: [facility] CATH LAB;  Service: Cardiovascular;  Laterality: N/A;  . ABDOMINAL AORTOGRAM W/LOWER EXTREMITY N/A 01/18/2020   Procedure: ABDOMINAL AORTOGRAM W/LOWER EXTREMITY - Right;  Surgeon: Iran Ouch, MD;  [facility] INVASIVE CV LAB;  Service: Cardiovascular;  Laterality: N/A;  . APPENDECTOMY    . COLONOSCOPY WITH PROPOFOL N/A 10/11/2015   Procedure: COLONOSCOPY WITH PROPOFOL;  Surgeon: Charna Elizabeth, MD;  Location: [facility] ENDOSCOPY;  Service: Endoscopy;  Laterality: N/A;  . CORONARY ARTERY BYPASS GRAFT N/A 11/04/2013   Procedure: CORONARY ARTERY BYPASS GRAFTING (CABG) TIMES FOUR  USING LEFT INTERNAL MAMMARY  ARTERY AND RIGHT AND LEFT SAPHENOUS LEG VEIN HARVESTED ENDOSCOPICALLY;  Surgeon: Loreli Slot, MD;  Location: [facility] OR;  Service: Open Heart Surgery;  Laterality: N/A;  . CRANIOTOMY Left 06/30/2019   Procedure: Frontal CRANIOTOMY HEMATOMA EVACUATION SUBDURAL;  Surgeon: Lisbeth Renshaw, MD;  Location: [facility] OR;  Service: Neurosurgery;  Laterality: Left;  Frontal CRANIOTOMY HEMATOMA EVACUATION SUBDURAL  . FEMORAL-POPLITEAL BYPASS GRAFT Right 01/20/2020   Procedure: BYPASS GRAFT FEMORAL-POPLITEAL ARTERY RIGHT LEG USING GORE PROPATEN GRAFT;  Surgeon: Nada Libman, MD;   Location: [facility] OR;  Service: Vascular;  Laterality: Right;  . FINGER SURGERY  2017   injury  . I & D EXTREMITY Right 03/02/2020   Procedure: IRRIGATION AND DEBRIDEMENT EXTREMITY right  lower leg  with Antibiotic beads.;  Surgeon: Chuck Hint, MD;  Location: [facility] OR;  Service: Vascular;  Laterality: Right;  . KNEE SURGERY     fractured patella  . LEFT HEART CATH N/A 10/29/2013   Procedure: LEFT HEART CATH;  Surgeon: Kathleene Hazel, MD;  Location: [facility] CATH LAB;  Service: Cardiovascular;  Laterality: N/A;  . LEFT HEART CATHETERIZATION WITH CORONARY ANGIOGRAM N/A 10/31/2013   Procedure: LEFT HEART CATHETERIZATION WITH CORONARY ANGIOGRAM;  Surgeon: Kathleene Hazel, MD;  Location: [facility] CATH LAB;  Service: Cardiovascular;  Laterality: N/A;  . MOUTH SURGERY    . TOE SURGERY      Allergies as of 08/28/2020      Reactions   Ace Inhibitors Swelling   Angioedema   Dairy Aid [lactase]    gas   Eggs Or Egg-derived Products    Cannot eat Prepared Eggs   Latex Itching      Medication List       Accurate as of August 28, 2020 11:59 PM. If you have any questions, ask your nurse or doctor.        acetaminophen 325 MG tablet Commonly known as: TYLENOL Take 2 tablets (650 mg total) by mouth 4 (four) times daily -  before meals and at bedtime. What changed:   when to take this  reasons to take this   aspirin EC 81 MG tablet Take 81 mg by mouth daily.   atorvastatin 80 MG tablet Commonly known as: LIPITOR TAKE 1 TABLET BY MOUTH  DAILY AT 6 PM   Calcium 600+D 600-800 MG-UNIT Tabs Generic drug: Calcium Carb-Cholecalciferol Take 1 tablet by mouth daily.   citalopram 20 MG tablet Commonly known as: CELEXA Take 1 tablet (20 mg total) by mouth daily.   clonazePAM 0.25 MG disintegrating tablet Commonly known as: KLONOPIN Take 1 tablet (0.25 mg total) by mouth 2 (two) times daily as needed.   clopidogrel 75 MG tablet Commonly known as: PLAVIX Take 1 tablet (75 mg  total) by mouth daily.   fluticasone 50 MCG/ACT nasal spray Commonly known as: FLONASE Place 2 sprays into both nostrils daily. What changed:   when to take this  reasons to take this   gabapentin 100 MG capsule Commonly known as: NEURONTIN Take 1 capsule (100 mg total) by mouth 2 (two) times daily.   HYDROcodone-acetaminophen 5-325 MG tablet Commonly known as: NORCO/VICODIN Take 1 tablet by mouth every 4 (four) hours as needed for moderate pain.   irbesartan 300 MG tablet Commonly known as: AVAPRO Take 1 tablet (300 mg total) by mouth daily.   loratadine 10 MG tablet Commonly known as: CLARITIN Take 1 tablet (10 mg total) by mouth daily.   melatonin 3 MG Tabs tablet Take 1 tablet by  mouth at bedtime.   metoprolol tartrate 25 MG tablet Commonly known as: LOPRESSOR Take 1 tablet (25 mg total) by mouth 2 (two) times daily.   pantoprazole 40 MG tablet Commonly known as: PROTONIX Take 1 tablet (40 mg total) by mouth at bedtime.   QUEtiapine 200 MG tablet Commonly known as: SEROQUEL Take 1 tablet (200 mg total) by mouth 2 (two) times daily.   sulfamethoxazole-trimethoprim 800-160 MG tablet Commonly known as: BACTRIM DS Take 1 tablet by mouth 2 (two) times daily. Started by: Willow Ora, MD   tamsulosin 0.4 MG Caps capsule Commonly known as: FLOMAX Take 2 capsules (0.8 mg total) by mouth daily with supper.          Objective:   Physical Exam Ht 6\' 2"  (1.88 m)   BMI 26.96 kg/m  This is a telephone assessment, no vital signs available. Johnney sounded well today, he was alert, very pleasant, in no distress.     Assessment     Assessment Prediabetes  dx 08-2014 HTN Hyperlipidemia Psych Anxiety: per pt, see OV 09/10/2019 Frontotemporal dementia dx 06/2019 but sx started gradually long before  CV: --CAD, MI , cath 10-2013 ---> CABG --Ischemic cardiomyopathy --Peripheral vascular disease Tobacco abuse -- intolerant to chantix before, tried Wellbutrin 2016 (not  much help) Onychomycosis-- rx lamisil per podiatry 12-2015 Subdural hematoma, s/p craniotomy: admited 06/2019     PLAN: UTI? The patient has symptoms suspicious for UTI, he took antibiotics x5 days, no urine culture was done.  He is better, the urine is still slightly concentrated but not as much as before. Ideally I would like to have a urine culture but that would not help b/c he already took antibiotics. Since he responded well, I will provide 5 additional days of Bactrim, encouraged to push fluids and call me if he is not completely back to normal soon. The patient and the daughter verbalized understanding.  I discussed the assessment and treatment plan with the patient. The patient was provided an opportunity to ask questions and all were answered. The patient agreed with the plan and demonstrated an understanding of the instructions.   The patient was advised to call back or seek an in-person evaluation if the symptoms worsen or if the condition fails to improve as anticipated.  I provided 19 minutes of non-face-to-face time during this encounter.  07/2019, MD

## 2020-08-29 ENCOUNTER — Other Ambulatory Visit: Payer: Self-pay | Admitting: Internal Medicine

## 2020-08-29 NOTE — Assessment & Plan Note (Signed)
UTI? The patient has symptoms suspicious for UTI, he took antibiotics x5 days, no urine culture was done.  He is better, the urine is still slightly concentrated but not as much as before. Ideally I would like to have a urine culture but that would not help b/c he already took antibiotics. Since he responded well, I will provide 5 additional days of Bactrim, encouraged to push fluids and call me if he is not completely back to normal soon. The patient and the daughter verbalized understanding.

## 2020-08-30 ENCOUNTER — Telehealth: Payer: Self-pay

## 2020-08-30 NOTE — Telephone Encounter (Signed)
Plan of care signed and faxed back to [facility] at [phone]. Form sent for scanning.  

## 2020-09-06 ENCOUNTER — Telehealth: Payer: Self-pay | Admitting: Internal Medicine

## 2020-09-06 NOTE — Telephone Encounter (Signed)
Caller: Beth ([facility]) Call back # [phone]  Waynetta Sandy is calling to inform you family is cancelling this week physical therapy due to a fall.

## 2020-09-09 ENCOUNTER — Emergency Department (HOSPITAL_COMMUNITY): Payer: Medicare Other

## 2020-09-09 ENCOUNTER — Inpatient Hospital Stay (HOSPITAL_COMMUNITY)
Admission: EM | Admit: 2020-09-09 | Discharge: 2020-09-12 | DRG: 871 | Disposition: A | Discharge status: Home Health | Payer: Medicare Other | Attending: Internal Medicine | Admitting: Internal Medicine

## 2020-09-09 DIAGNOSIS — N400 Enlarged prostate without lower urinary tract symptoms: Secondary | ICD-10-CM | POA: Diagnosis present

## 2020-09-09 DIAGNOSIS — B9620 Unspecified Escherichia coli [E. coli] as the cause of diseases classified elsewhere: Secondary | ICD-10-CM | POA: Diagnosis present

## 2020-09-09 DIAGNOSIS — F0280 Dementia in other diseases classified elsewhere without behavioral disturbance: Secondary | ICD-10-CM | POA: Diagnosis present

## 2020-09-09 DIAGNOSIS — Z823 Family history of stroke: Secondary | ICD-10-CM

## 2020-09-09 DIAGNOSIS — K219 Gastro-esophageal reflux disease without esophagitis: Secondary | ICD-10-CM | POA: Diagnosis present

## 2020-09-09 DIAGNOSIS — G9341 Metabolic encephalopathy: Secondary | ICD-10-CM | POA: Diagnosis present

## 2020-09-09 DIAGNOSIS — A4151 Sepsis due to Escherichia coli [E. coli]: Secondary | ICD-10-CM | POA: Diagnosis not present

## 2020-09-09 DIAGNOSIS — N10 Acute tubulo-interstitial nephritis: Secondary | ICD-10-CM

## 2020-09-09 DIAGNOSIS — I2510 Atherosclerotic heart disease of native coronary artery without angina pectoris: Secondary | ICD-10-CM | POA: Diagnosis present

## 2020-09-09 DIAGNOSIS — R6520 Severe sepsis without septic shock: Secondary | ICD-10-CM | POA: Diagnosis present

## 2020-09-09 DIAGNOSIS — Z8673 Personal history of transient ischemic attack (TIA), and cerebral infarction without residual deficits: Secondary | ICD-10-CM

## 2020-09-09 DIAGNOSIS — Z79899 Other long term (current) drug therapy: Secondary | ICD-10-CM

## 2020-09-09 DIAGNOSIS — Z8261 Family history of arthritis: Secondary | ICD-10-CM

## 2020-09-09 DIAGNOSIS — I255 Ischemic cardiomyopathy: Secondary | ICD-10-CM | POA: Diagnosis present

## 2020-09-09 DIAGNOSIS — E872 Acidosis, unspecified: Secondary | ICD-10-CM

## 2020-09-09 DIAGNOSIS — Z8249 Family history of ischemic heart disease and other diseases of the circulatory system: Secondary | ICD-10-CM

## 2020-09-09 DIAGNOSIS — S82001A Unspecified fracture of right patella, initial encounter for closed fracture: Secondary | ICD-10-CM | POA: Diagnosis present

## 2020-09-09 DIAGNOSIS — Z20822 Contact with and (suspected) exposure to covid-19: Secondary | ICD-10-CM | POA: Diagnosis present

## 2020-09-09 DIAGNOSIS — I252 Old myocardial infarction: Secondary | ICD-10-CM

## 2020-09-09 DIAGNOSIS — N390 Urinary tract infection, site not specified: Secondary | ICD-10-CM | POA: Diagnosis present

## 2020-09-09 DIAGNOSIS — G629 Polyneuropathy, unspecified: Secondary | ICD-10-CM | POA: Diagnosis present

## 2020-09-09 DIAGNOSIS — Z833 Family history of diabetes mellitus: Secondary | ICD-10-CM

## 2020-09-09 DIAGNOSIS — A419 Sepsis, unspecified organism: Secondary | ICD-10-CM | POA: Diagnosis present

## 2020-09-09 DIAGNOSIS — G3109 Other frontotemporal dementia: Secondary | ICD-10-CM | POA: Diagnosis present

## 2020-09-09 DIAGNOSIS — Z1629 Resistance to other single specified antibiotic: Secondary | ICD-10-CM | POA: Diagnosis present

## 2020-09-09 DIAGNOSIS — Z87440 Personal history of urinary (tract) infections: Secondary | ICD-10-CM

## 2020-09-09 DIAGNOSIS — I10 Essential (primary) hypertension: Secondary | ICD-10-CM | POA: Diagnosis present

## 2020-09-09 DIAGNOSIS — N179 Acute kidney failure, unspecified: Secondary | ICD-10-CM | POA: Diagnosis present

## 2020-09-09 DIAGNOSIS — Z888 Allergy status to other drugs, medicaments and biological substances status: Secondary | ICD-10-CM

## 2020-09-09 DIAGNOSIS — W19XXXA Unspecified fall, initial encounter: Secondary | ICD-10-CM | POA: Diagnosis present

## 2020-09-09 DIAGNOSIS — Z7982 Long term (current) use of aspirin: Secondary | ICD-10-CM

## 2020-09-09 DIAGNOSIS — R1110 Vomiting, unspecified: Secondary | ICD-10-CM

## 2020-09-09 DIAGNOSIS — Z91040 Latex allergy status: Secondary | ICD-10-CM

## 2020-09-09 DIAGNOSIS — R509 Fever, unspecified: Secondary | ICD-10-CM

## 2020-09-09 DIAGNOSIS — Z951 Presence of aortocoronary bypass graft: Secondary | ICD-10-CM

## 2020-09-09 DIAGNOSIS — K5900 Constipation, unspecified: Secondary | ICD-10-CM | POA: Diagnosis present

## 2020-09-09 DIAGNOSIS — I739 Peripheral vascular disease, unspecified: Secondary | ICD-10-CM | POA: Diagnosis present

## 2020-09-09 DIAGNOSIS — F17210 Nicotine dependence, cigarettes, uncomplicated: Secondary | ICD-10-CM | POA: Diagnosis present

## 2020-09-09 DIAGNOSIS — E785 Hyperlipidemia, unspecified: Secondary | ICD-10-CM | POA: Diagnosis present

## 2020-09-09 DIAGNOSIS — D649 Anemia, unspecified: Secondary | ICD-10-CM | POA: Diagnosis present

## 2020-09-09 LAB — URINALYSIS, ROUTINE W REFLEX MICROSCOPIC
Bilirubin Urine: NEGATIVE
Glucose, UA: 50 mg/dL — AB
Hgb urine dipstick: NEGATIVE
Ketones, ur: NEGATIVE mg/dL
Nitrite: POSITIVE — AB
Protein, ur: 30 mg/dL — AB
Specific Gravity, Urine: 1.027 (ref 1.005–1.030)
WBC, UA: >50 WBC/hpf — ABNORMAL HIGH (ref 0–5)
pH: 5 (ref 5.0–8.0)

## 2020-09-09 LAB — COMPREHENSIVE METABOLIC PANEL
ALT: 36 U/L (ref 0–44)
AST: 32 U/L (ref 15–41)
Albumin: 3.8 g/dL (ref 3.5–5.0)
Alkaline Phosphatase: 99 U/L (ref 38–126)
Anion gap: 11 (ref 5–15)
BUN: 19 mg/dL (ref 8–23)
CO2: 23 mmol/L (ref 22–32)
Calcium: 9.6 mg/dL (ref 8.9–10.3)
Chloride: 102 mmol/L (ref 98–111)
Creatinine, Ser: 1.26 mg/dL — ABNORMAL HIGH (ref 0.61–1.24)
GFR calc Af Amer: >60 mL/min (ref >60)
GFR calc non Af Amer: 59 mL/min — ABNORMAL LOW (ref >60)
Glucose, Bld: 141 mg/dL — ABNORMAL HIGH (ref 70–99)
Potassium: 4.4 mmol/L (ref 3.5–5.1)
Sodium: 136 mmol/L (ref 135–145)
Total Bilirubin: 0.9 mg/dL (ref 0.3–1.2)
Total Protein: 7 g/dL (ref 6.5–8.1)

## 2020-09-09 LAB — CBC WITH DIFFERENTIAL/PLATELET
Abs Immature Granulocytes: 0.15 10*3/uL — ABNORMAL HIGH (ref 0.00–0.07)
Basophils Absolute: 0.1 10*3/uL (ref 0.0–0.1)
Basophils Relative: 0 %
Eosinophils Absolute: 0 10*3/uL (ref 0.0–0.5)
Eosinophils Relative: 0 %
HCT: 36.5 % — ABNORMAL LOW (ref 39.0–52.0)
Hemoglobin: 11.6 g/dL — ABNORMAL LOW (ref 13.0–17.0)
Immature Granulocytes: 1 %
Lymphocytes Relative: 3 %
Lymphs Abs: 0.5 10*3/uL — ABNORMAL LOW (ref 0.7–4.0)
MCH: 27.4 pg (ref 26.0–34.0)
MCHC: 31.8 g/dL (ref 30.0–36.0)
MCV: 86.1 fL (ref 80.0–100.0)
Monocytes Absolute: 0.9 10*3/uL (ref 0.1–1.0)
Monocytes Relative: 4 %
Neutro Abs: 18.3 10*3/uL — ABNORMAL HIGH (ref 1.7–7.7)
Neutrophils Relative %: 92 %
Platelets: 354 10*3/uL (ref 150–400)
RBC: 4.24 MIL/uL (ref 4.22–5.81)
RDW: 14.5 % (ref 11.5–15.5)
WBC: 19.9 10*3/uL — ABNORMAL HIGH (ref 4.0–10.5)
nRBC: 0 % (ref 0.0–0.2)

## 2020-09-09 LAB — LACTIC ACID, PLASMA: Lactic Acid, Venous: 3.8 mmol/L (ref 0.5–1.9)

## 2020-09-09 LAB — LIPASE, BLOOD: Lipase: 21 U/L (ref 11–51)

## 2020-09-09 IMAGING — CT CT HEAD W/O CM
4 series · 17 of 47 positions shown, 19 images · non-contrast
Comparison: [DATE]

CLINICAL DATA: Unresponsive

EXAM:
CT HEAD WITHOUT CONTRAST
TECHNIQUE: Contiguous axial images were obtained from the base of the skull
through the vertex without intravenous contrast.

[Series 3: head wo · axial · 0.47mm/px · z∈[-146,-11]mm · 7 of 37 slices shown, 9 images]
[im 5/37  brain]
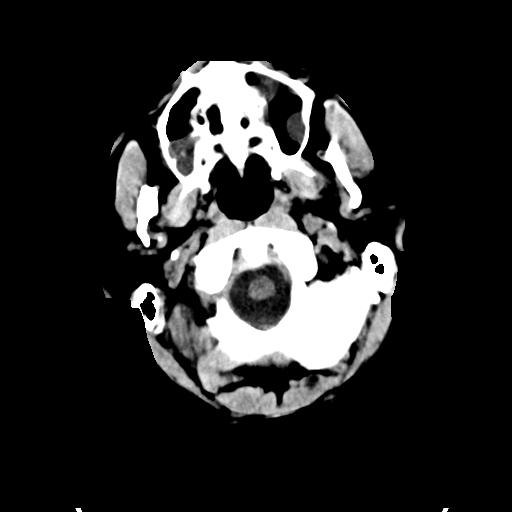
[im 5/37  bone]
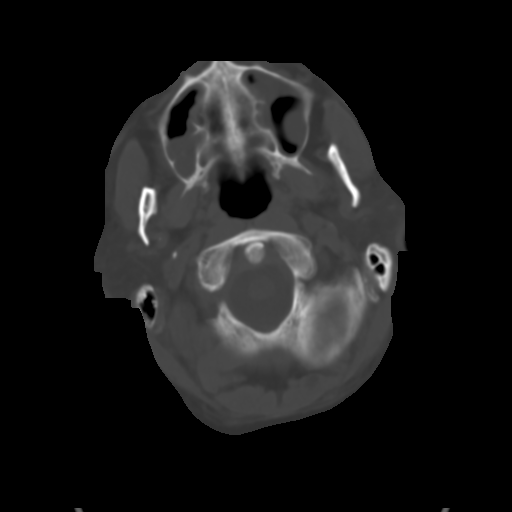
[im 10/37  brain]
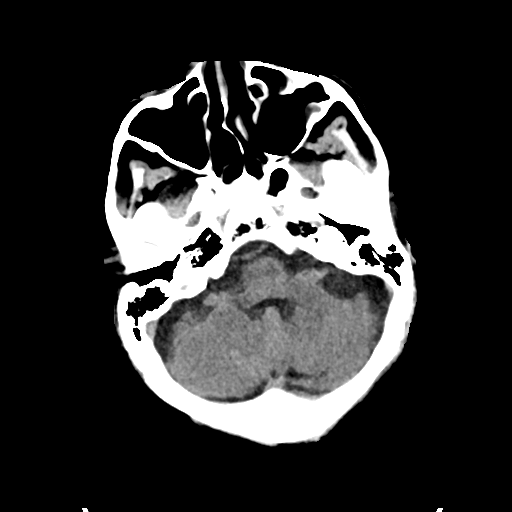
[im 14/37  brain]
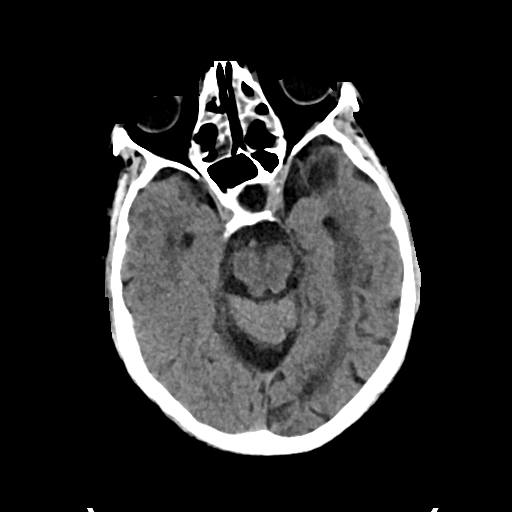
[im 19/37  brain]
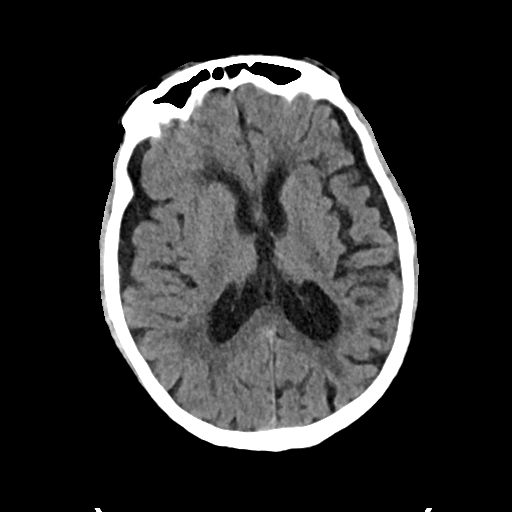
[im 23/37  brain]
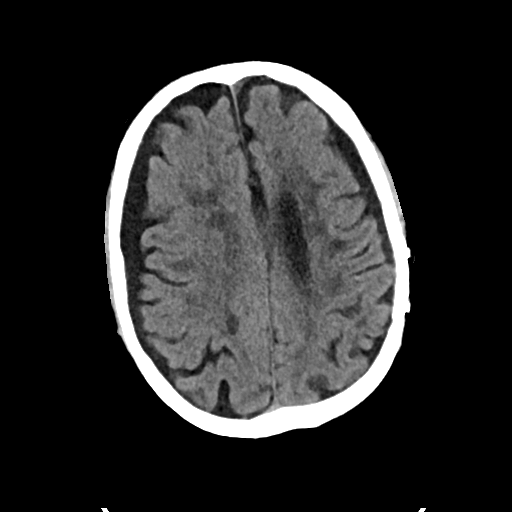
[im 23/37  bone]
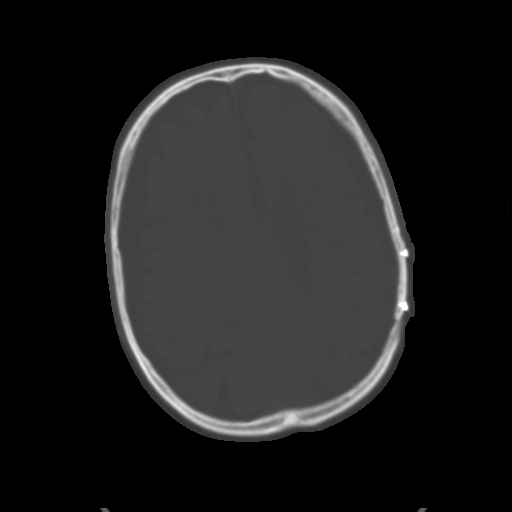
[im 28/37  brain]
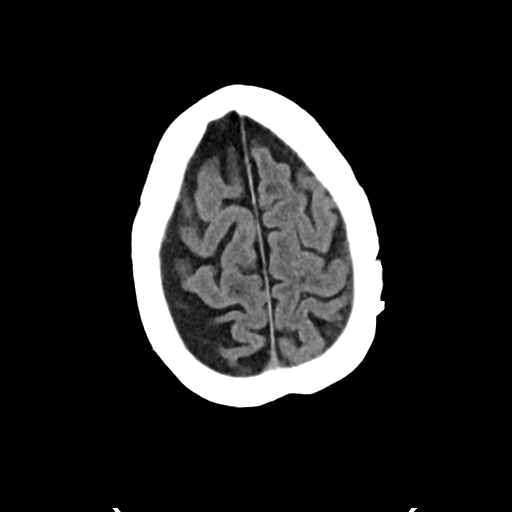
[im 32/37  brain]
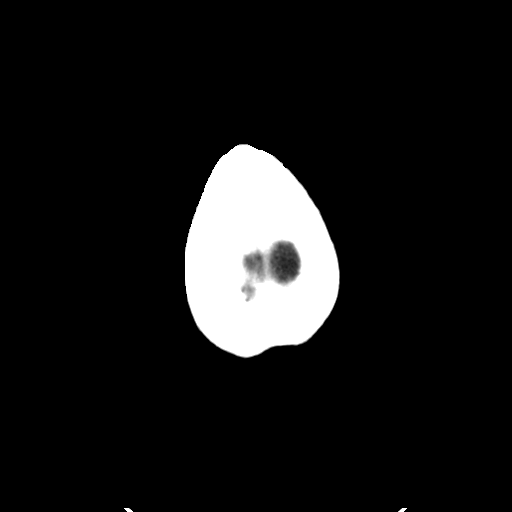

[Series 4: head bone · axial · 0.47mm/px · z∈[-148,-86]mm · 4 of 91 slices shown]
[im 10/91  bone]
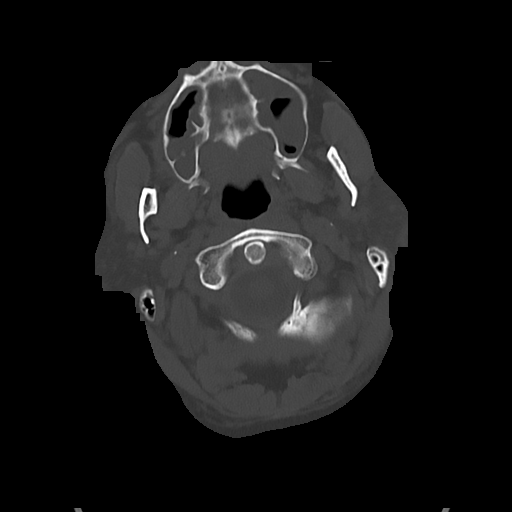
[im 19/91  bone]
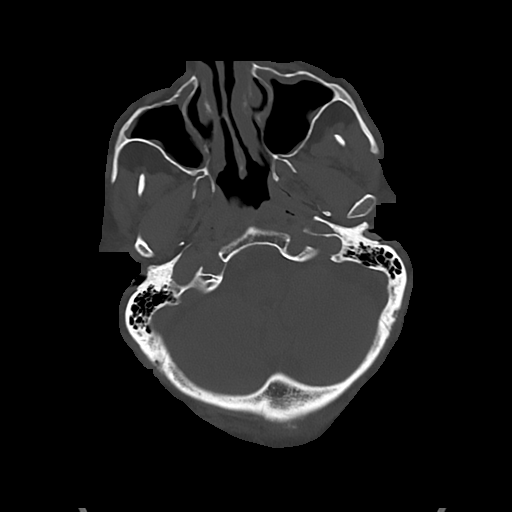
[im 28/91  bone]
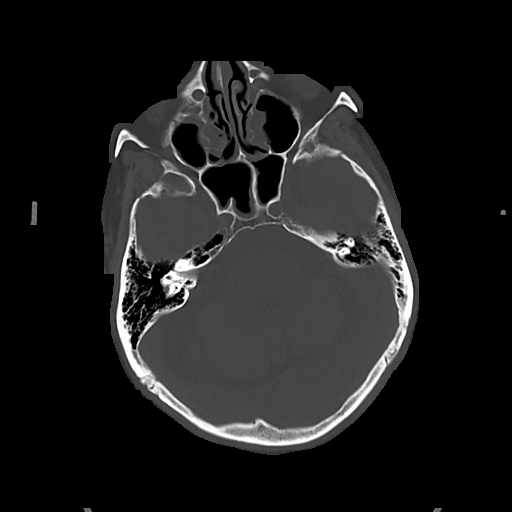
[im 41/91  bone]
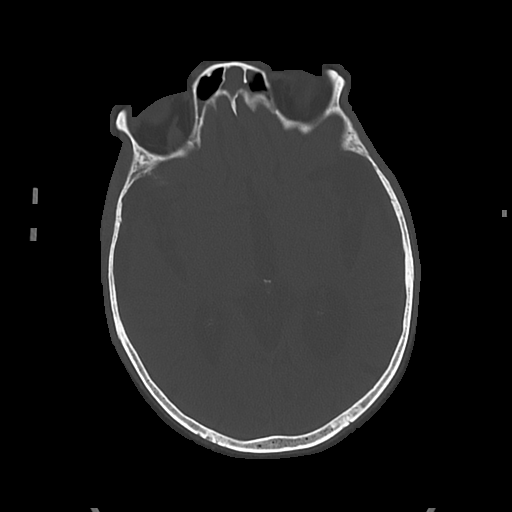

[Series 5: cor soft · coronal · 0.35mm/px · 3 of 74 slices shown]
[im 25/74  brain]
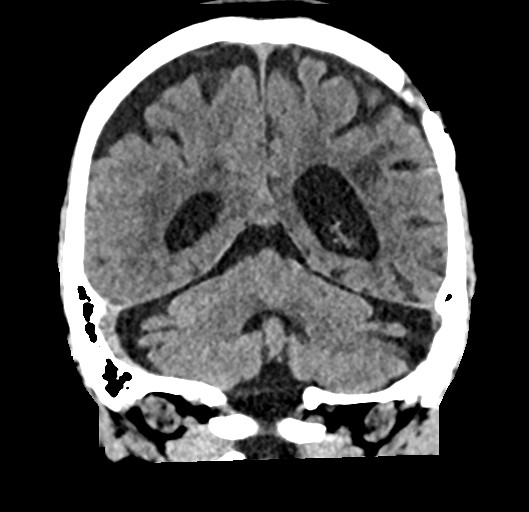
[im 33/74  brain]
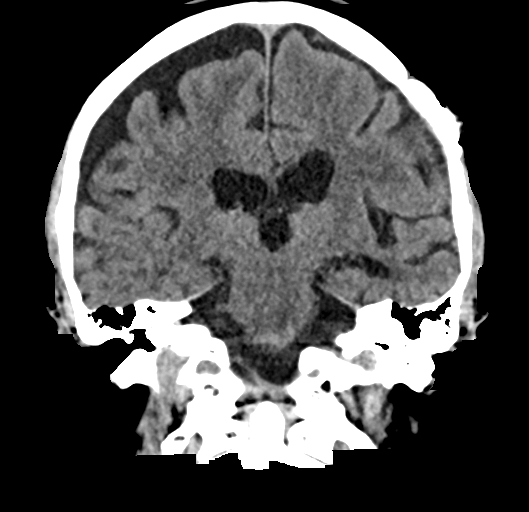
[im 41/74  brain]
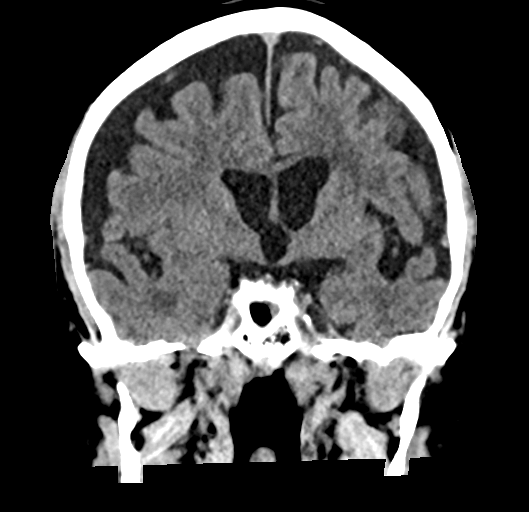

[Series 6: sag soft · sagittal · 0.35mm/px · 3 of 62 slices shown]
[im 21/62  brain]
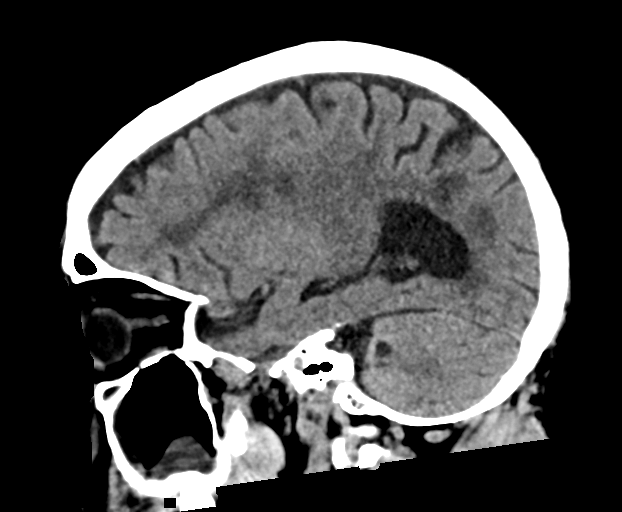
[im 31/62  brain]
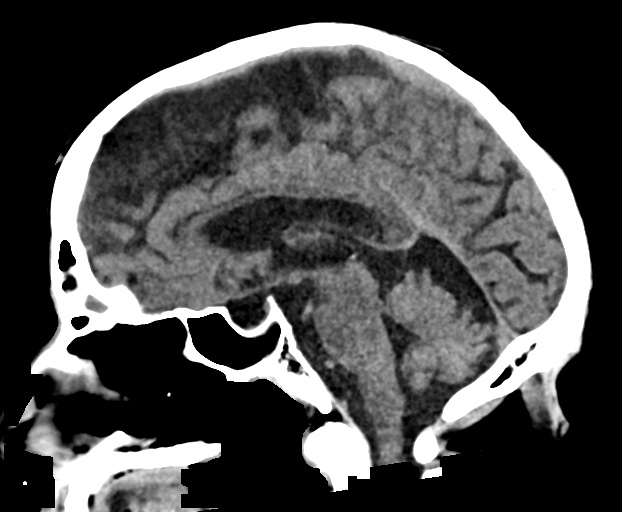
[im 41/62  brain]
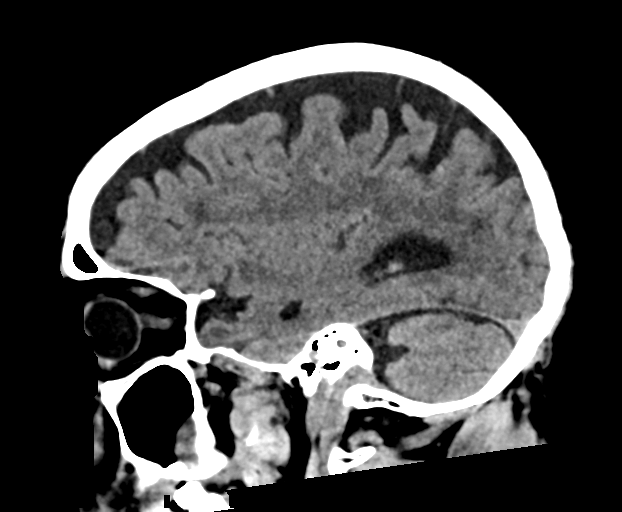

[17 of 47 positions shown; findings below may reference images not displayed]

FINDINGS: Brain: Chronic atrophic and ischemic changes are identified similar
to that seen on the prior exam. Prominent CSF space is noted
diffusely consistent with the atrophic change. No findings to
suggest acute hemorrhage, acute infarction or space-occupying mass
lesion are noted. Previously seen left frontal subdural hematoma has
resolved in the interval. Partially empty sella is noted as well.

Vascular: No hyperdense vessel or unexpected calcification.

Skull: Postsurgical changes in the left parietal region are seen. No
other bony abnormality is noted.

Sinuses/Orbits: Chronic mucosal changes within the ethmoid and
maxillary antra stable from the prior exam.

Other: None
IMPRESSION: Chronic changes without acute abnormality.

## 2020-09-09 IMAGING — CT CT ABD-PELV W/ CM
2 of 5 series · 15 of 46 positions shown, 17 images · IV contrast (APPLIED)
Comparison: None.

CLINICAL DATA: Nausea, vomiting, coffee-ground emesis

EXAM:
CT ABDOMEN AND PELVIS WITH CONTRAST
TECHNIQUE: Multidetector CT imaging of the abdomen and pelvis was performed
using the standard protocol following bolus administration of
intravenous contrast.
CONTRAST:  100mL OMNIPAQUE IOHEXOL 300 MG/ML  SOLN

[Series 3: abdomen 5.0 · axial · 0.84mm/px · z∈[-836,-451]mm · 12 of 93 slices shown, 14 images]
[im 8/93  soft-tissue]
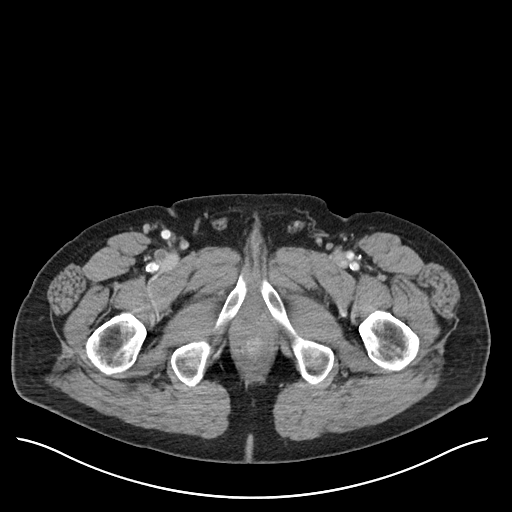
[im 8/93  bone]
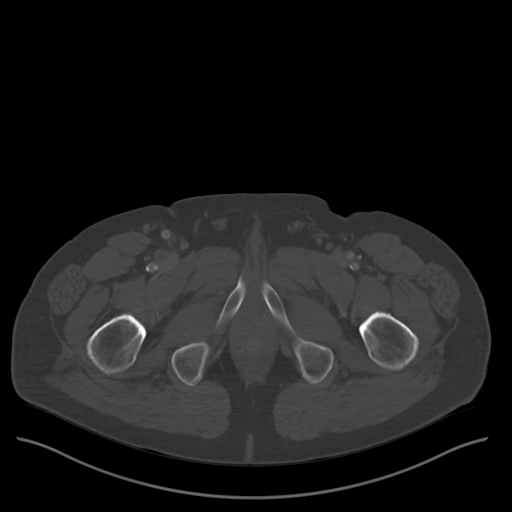
[im 15/93  soft-tissue]
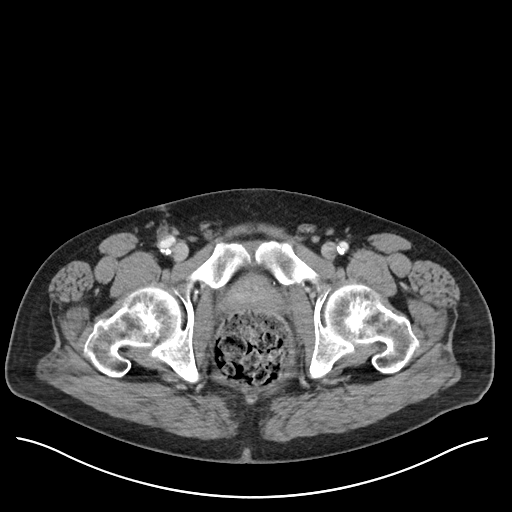
[im 22/93  soft-tissue]
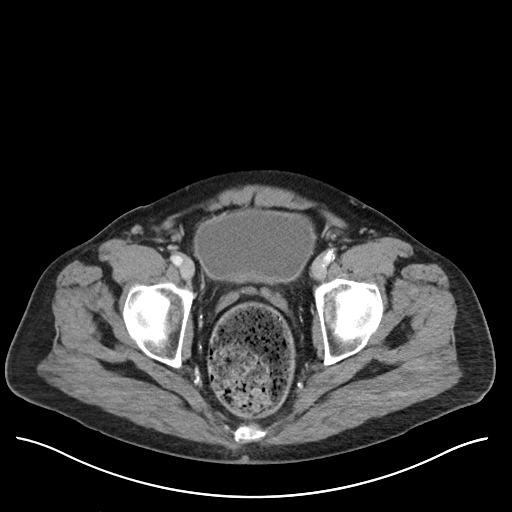
[im 29/93  soft-tissue]
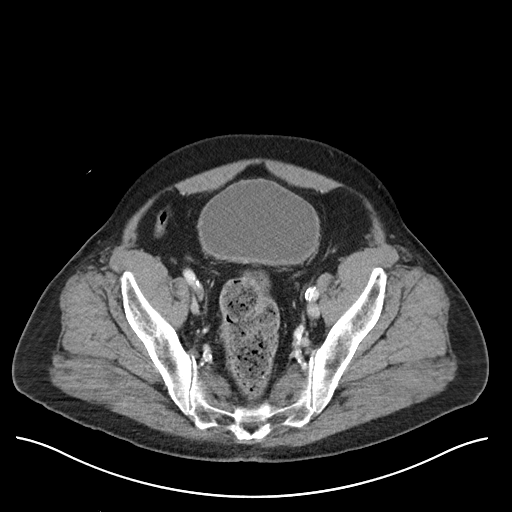
[im 36/93  soft-tissue]
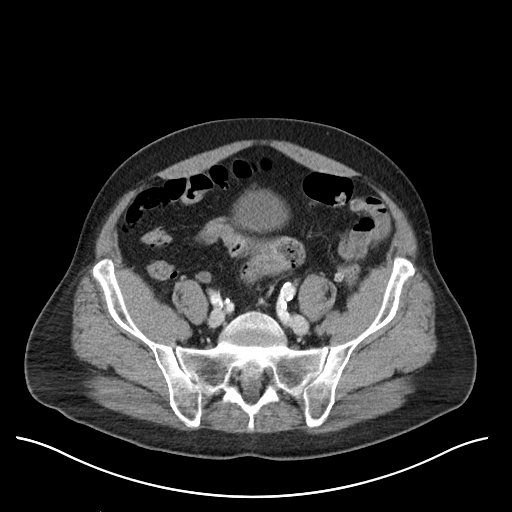
[im 43/93  soft-tissue]
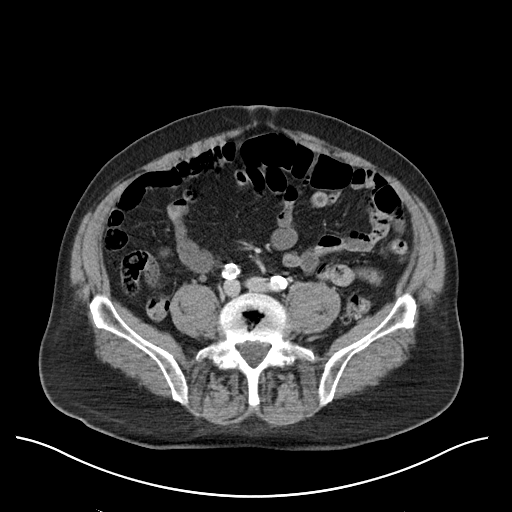
[im 50/93  soft-tissue]
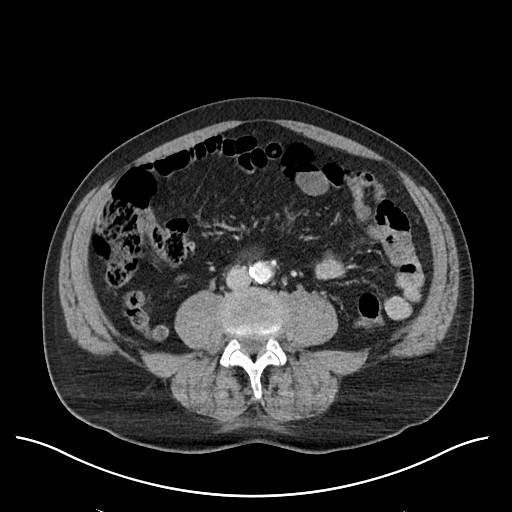
[im 57/93  soft-tissue]
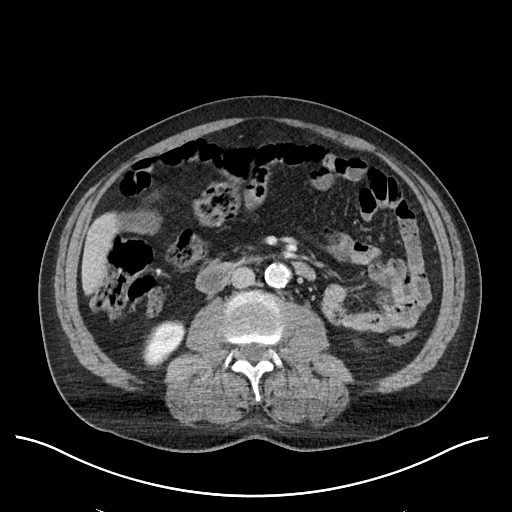
[im 64/93  soft-tissue]
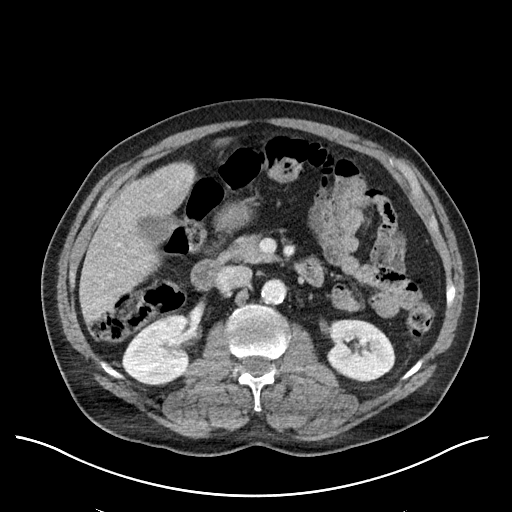
[im 64/93  bone]
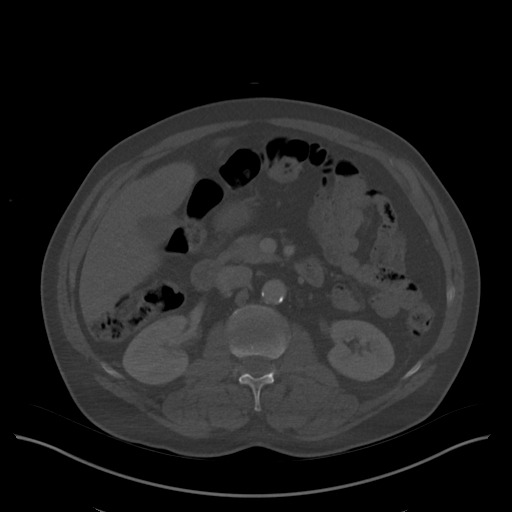
[im 71/93  soft-tissue]
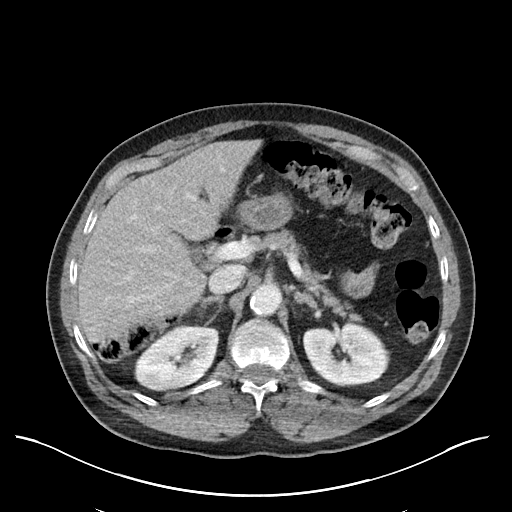
[im 78/93  soft-tissue]
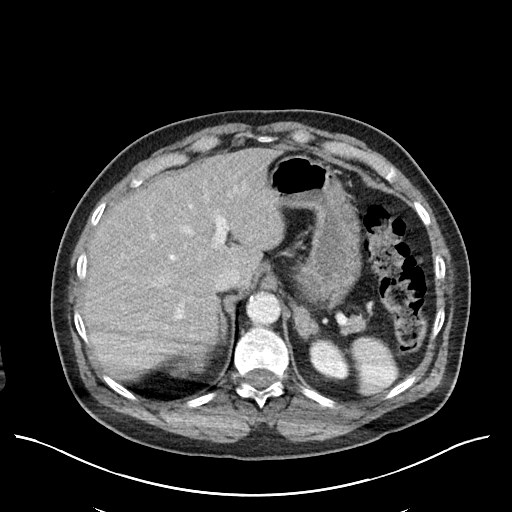
[im 85/93  soft-tissue]
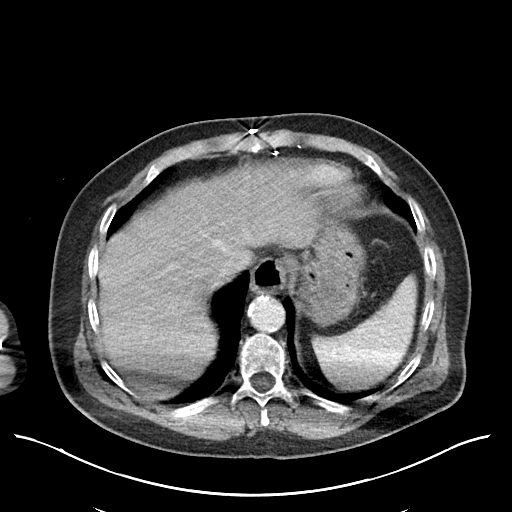

[Series 6: abdomen 3.0 mpr cor · coronal · 0.73mm/px · 3 of 105 slices shown]
[im 35/105  soft-tissue]
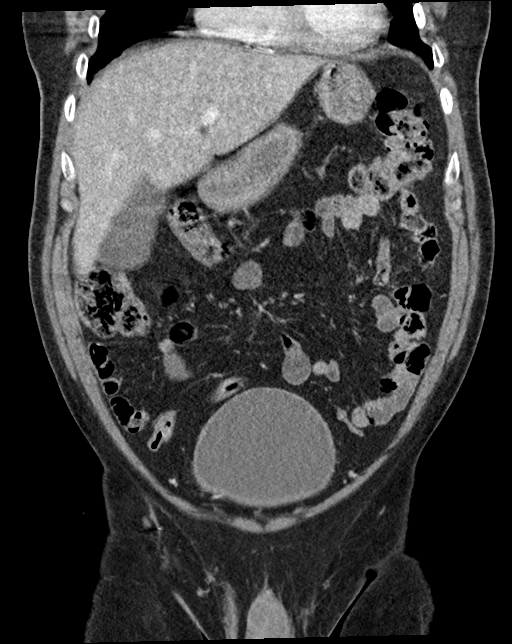
[im 47/105  soft-tissue]
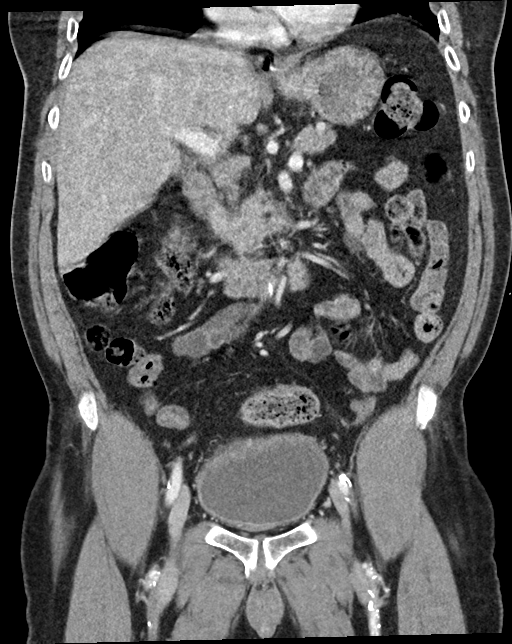
[im 58/105  soft-tissue]
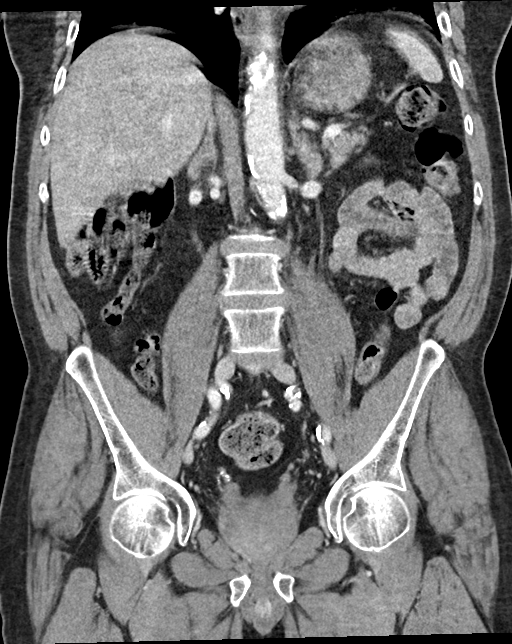

[15 of 46 positions shown; findings below may reference images not displayed]

FINDINGS: Lower chest: The visualized lung bases are clear bilaterally. At
least moderate right coronary artery calcification. Cardiac size
within normal limits. No pericardial effusion. Median sternotomy has
been performed. Small hiatal hernia present.

Hepatobiliary: No focal liver abnormality is seen. No gallstones,
gallbladder wall thickening, or biliary dilatation.

Pancreas: Unremarkable

Spleen: Unremarkable

Adrenals/Urinary Tract: Adrenal glands are unremarkable. Kidneys are
normal, without renal calculi, focal lesion, or hydronephrosis.
Bladder is unremarkable.

Stomach/Bowel: The stomach, and small bowel are unremarkable.
Appendix absent. The sigmoid colon is redundant. Moderate distal
descending and proximal sigmoid diverticulosis without superimposed
inflammatory change. No obstruction. Large stool within the rectal
vault raising the question of fecal impaction. No perirectal
inflammatory change identified. No free intraperitoneal gas or
fluid. Tiny fat containing umbilical hernia.

Vascular/Lymphatic: Extensive aortoiliac atherosclerotic
calcification without evidence of aneurysm. Extensive
atherosclerotic calcification is seen within the common iliac
arteries bilaterally and left external iliac artery, however, the
degree of stenosis is not well assessed on this examination. Right
femoral bypass graft is partially visualized with occlusion of the
superficial femoral artery proximally. Extensive atherosclerotic
calcification noted within the visualized left lower extremity
arterial outflow.

No pathologic adenopathy within the abdomen and pelvis.

Reproductive: Prostate is unremarkable.

Other: None significant

Musculoskeletal: No lytic or blastic bone lesions are seen.
Degenerative changes are seen within the lumbar spine.
IMPRESSION: No definite radiographic explanation for the patient's symptoms.

Moderate stool within the rectal vault raising the question of fecal
impaction.

Aortic Atherosclerosis ([VU]-[VU]).

## 2020-09-09 MED ORDER — SODIUM CHLORIDE 0.9 % IV SOLN
1.0000 g | INTRAVENOUS | Status: DC
  Administered 2020-09-10: 1 g via INTRAVENOUS
  Filled 2020-09-09: qty 10

## 2020-09-09 MED ORDER — ONDANSETRON HCL 4 MG/2ML IJ SOLN
4.0000 mg | Freq: Once | INTRAMUSCULAR | Status: AC
  Administered 2020-09-09: 4 mg via INTRAVENOUS
  Filled 2020-09-09: qty 2

## 2020-09-09 MED ORDER — LACTATED RINGERS IV BOLUS
1000.0000 mL | Freq: Once | INTRAVENOUS | Status: AC
  Administered 2020-09-09: 1000 mL via INTRAVENOUS

## 2020-09-09 MED ORDER — LACTATED RINGERS IV SOLN: INTRAVENOUS | Status: AC

## 2020-09-09 MED ORDER — IOHEXOL 300 MG/ML  SOLN
100.0000 mL | Freq: Once | INTRAMUSCULAR | Status: AC | PRN
  Administered 2020-09-09: 100 mL via INTRAVENOUS

## 2020-09-09 NOTE — ED Notes (Signed)
Urine culture sent to lab.

## 2020-09-09 NOTE — ED Triage Notes (Addendum)
Patient presents from home. Wife found him in the floor in his own emesis. Reports pt was at the bottom of his recliner chair on the floor, appeared as if he slid off. No gross deformities noted. EMS reports dark, undigested food particles. Denies gross blood/coffee ground material. Hx of dementia. Patient alert, but confused. Requires frequent reorientation. Patient c/o lower medial ABD pain. No active vomiting at this time. EMS reports patient fell Monday, broke knee - details unknown. Pt has brace to RLE.

## 2020-09-09 NOTE — ED Provider Notes (Signed)
[facility] EMERGENCY DEPARTMENT Provider Note   CSN: [id number] Arrival date & time: 09/09/20  1908     History Chief Complaint  Patient presents with  . Emesis    Lonnie [name] is a 69 y.o. male.   Emesis Severity:  Moderate Timing:  Rare Quality:  Stomach contents Progression:  Unchanged Chronicity:  New Recent urination:  Decreased Relieved by:  Nothing Worsened by:  Nothing Ineffective treatments:  None tried Associated symptoms: no fever        Past Medical History:  Diagnosis Date  . Allergy   . Anxiety   . Arthritis    left neck, shoulder, knee  . CAD (coronary artery disease)    a. anterior STEMI 10/2013 s/p 4V CABG with LIMA to mid LAD, SVG to OM, SVG to PDA, SVG to Diagonal.  . Dementia (HCC)   . Depression   . Falls   . Hypertension   . Ischemic cardiomyopathy    a. EF 40-45% at time of CABG and in 2018.  . MI (myocardial infarction) (HCC)   . Pre-diabetes   . Prediabetes 09/01/2014  . PVD (peripheral vascular disease) (HCC)    a. s/p L SFA stents with now known bilateral SFA occlusion treated medically.  . Stroke [facility])    seen on CT Scan  . Tobacco abuse     Patient Active Problem List   Diagnosis Date Noted  . Sepsis secondary to UTI (HCC) 09/10/2020  . Acute metabolic encephalopathy 09/10/2020  . Closed fracture of right patella 09/10/2020  . PAD (peripheral artery disease) (HCC) 01/20/2020  . Fronto-temporal dementia (HCC) 08/04/2019  . Left shoulder pain   . TBI (traumatic brain injury) (HCC)   . Urinary retention   . Postoperative pain   . Agitation   . SDH (subdural hematoma) (HCC) 07/08/2019  . Subdural hematoma (HCC) 06/30/2019  . Erectile dysfunction 07/28/2018  . Peripheral vascular disease (HCC) 07/16/2017  . Anxiety and depression 07/16/2017  . PCP NOTES >>>>>>>>>>>> 07/08/2016  . Dermatitis 04/19/2015  . Elevated LFTs 02/12/2015  . Hyperglycemia 09/01/2014  . DJD (degenerative joint disease)  09/01/2014  . Annual physical exam 09/01/2014  . S/P CABG x 4 11/07/2013  . HTN (hypertension) 10/29/2013  . Tobacco abuse 10/29/2013  . Coronary atherosclerosis of native coronary artery 10/29/2013    Past Surgical History:  Procedure Laterality Date  . ABDOMINAL AORTAGRAM N/A 06/07/2014   Procedure: ABDOMINAL Ronny Flurry;  Surgeon: Iran Ouch, MD;  Location: [facility] CATH LAB;  Service: Cardiovascular;  Laterality: N/A;  . ABDOMINAL AORTAGRAM N/A 03/07/2015   Procedure: ABDOMINAL Ronny Flurry;  Surgeon: Iran Ouch, MD;  Location: [facility] CATH LAB;  Service: Cardiovascular;  Laterality: N/A;  . ABDOMINAL AORTOGRAM W/LOWER EXTREMITY N/A 01/18/2020   Procedure: ABDOMINAL AORTOGRAM W/LOWER EXTREMITY - Right;  Surgeon: Iran Ouch, MD;  Location: [facility] INVASIVE CV LAB;  Service: Cardiovascular;  Laterality: N/A;  . APPENDECTOMY    . COLONOSCOPY WITH PROPOFOL N/A 10/11/2015   Procedure: COLONOSCOPY WITH PROPOFOL;  Surgeon: Charna Elizabeth, MD;  Location: [facility] ENDOSCOPY;  Service: Endoscopy;  Laterality: N/A;  . CORONARY ARTERY BYPASS GRAFT N/A 11/04/2013   Procedure: CORONARY ARTERY BYPASS GRAFTING (CABG) TIMES FOUR  USING LEFT INTERNAL MAMMARY ARTERY AND RIGHT AND LEFT SAPHENOUS LEG VEIN HARVESTED ENDOSCOPICALLY;  Surgeon: Loreli Slot, MD;  Location[facility] OR;  Service: Open Heart Surgery;  Laterality: N/A;  . CRANIOTOMY Left 06/30/2019   Procedure: Frontal CRANIOTOMY HEMATOMA EVACUATION SUBDURAL;  Surgeon: Conchita Paris,  Marlane Hatcher, MD;  Location: [facility] OR;  Service: Neurosurgery;  Laterality: Left;  Frontal CRANIOTOMY HEMATOMA EVACUATION SUBDURAL  . FEMORAL-POPLITEAL BYPASS GRAFT Right 01/20/2020   Procedure: BYPASS GRAFT FEMORAL-POPLITEAL ARTERY RIGHT LEG USING GORE PROPATEN GRAFT;  Surgeon: Nada Libman, MD;  Location: [facility] OR;  Service: Vascular;  Laterality: Right;  . FINGER SURGERY  2017   injury  . I & D EXTREMITY Right 03/02/2020   Procedure: IRRIGATION AND DEBRIDEMENT EXTREMITY right  lower leg   with Antibiotic beads.;  Surgeon: Chuck Hint, MD;  Location: [facility] OR;  Service: Vascular;  Laterality: Right;  . KNEE SURGERY     fractured patella  . LEFT HEART CATH N/A 10/29/2013   Procedure: LEFT HEART CATH;  Surgeon: Kathleene Hazel, MD;  Location: [facility] CATH LAB;  Service: Cardiovascular;  Laterality: N/A;  . LEFT HEART CATHETERIZATION WITH CORONARY ANGIOGRAM N/A 10/31/2013   Procedure: LEFT HEART CATHETERIZATION WITH CORONARY ANGIOGRAM;  Surgeon: Kathleene Hazel, MD;  Location: [facility] CATH LAB;  Service: Cardiovascular;  Laterality: N/A;  . MOUTH SURGERY    . TOE SURGERY         Family History  Problem Relation Age of Onset  . CAD Father 13  . AAA (abdominal aortic aneurysm) Father   . Alcohol abuse Father   . Hypertension Father   . Diabetes Father   . Heart attack Father   . Stroke Mother   . Arthritis Mother   . Heart disease Mother   . Hypertension Mother   . Heart attack Mother   . Cardiomyopathy Daughter   . Colon cancer Neg Hx   . Prostate cancer Neg Hx     Social History   Tobacco Use  . Smoking status: Current Every Day Smoker    Packs/day: 0.25    Years: 30.00    Pack years: 7.50    Types: Cigarettes  . Smokeless tobacco: Never Used  . Tobacco comment: < 1/2 ppd  Vaping Use  . Vaping Use: Never used  Substance Use Topics  . Alcohol use: No    Alcohol/week: 0.0 standard drinks  . Drug use: No    Home Medications Prior to Admission medications   Medication Sig Start Date End Date Taking? Authorizing Provider  acetaminophen (TYLENOL) 325 MG tablet Take 2 tablets (650 mg total) by mouth 4 (four) times daily -  before meals and at bedtime. Patient taking differently: Take 650 mg by mouth 2 (two) times daily.  07/22/19  Yes Love, Evlyn Kanner, PA-C  aspirin EC 81 MG tablet Take 81 mg by mouth daily.   Yes [provider]  atorvastatin (LIPITOR) 80 MG tablet TAKE 1 TABLET BY MOUTH  DAILY AT 6 PM 07/17/20  Yes Paz, Nolon Rod, MD   Calcium Carb-Cholecalciferol (CALCIUM 600+D) 600-800 MG-UNIT TABS Take 1 tablet by mouth daily.   Yes [provider]  citalopram (CELEXA) 20 MG tablet Take 1 tablet (20 mg total) by mouth daily. Patient taking differently: Take 10 mg by mouth 2 (two) times daily.  07/17/20  Yes Paz, Nolon Rod, MD  clonazePAM (KLONOPIN) 0.25 MG disintegrating tablet Take 1 tablet (0.25 mg total) by mouth 2 (two) times daily as needed. Patient taking differently: Take 0.25 mg by mouth 2 (two) times daily.  07/17/20  Yes Paz, Nolon Rod, MD  clopidogrel (PLAVIX) 75 MG tablet Take 1 tablet (75 mg total) by mouth daily. 01/23/20  Yes Setzer, Lynnell Jude, PA-C  docusate sodium (COLACE) 100 MG capsule Take  100 mg by mouth daily.   Yes [provider]  gabapentin (NEURONTIN) 100 MG capsule Take 1 capsule (100 mg total) by mouth 2 (two) times daily. 07/17/20  Yes Paz, Nolon Rod, MD  irbesartan (AVAPRO) 300 MG tablet Take 1 tablet (300 mg total) by mouth daily. 07/17/20  Yes Paz, Nolon Rod, MD  Liniments (BEN GAY EX) Apply 1 application topically as needed (typically right knee).   Yes [provider]  Melatonin 3 MG TABS Take 6 mg by mouth at bedtime.    Yes [provider]  metoprolol tartrate (LOPRESSOR) 25 MG tablet Take 1 tablet (25 mg total) by mouth 2 (two) times daily. 07/17/20  Yes Paz, Nolon Rod, MD  Multiple Vitamin (MULTIVITAMIN WITH MINERALS) TABS tablet Take 1 tablet by mouth daily.   Yes [provider]  pantoprazole (PROTONIX) 40 MG tablet Take 1 tablet (40 mg total) by mouth at bedtime. 07/17/20  Yes Paz, Nolon Rod, MD  QUEtiapine (SEROQUEL) 200 MG tablet Take 1 tablet (200 mg total) by mouth 2 (two) times daily. 08/29/20  Yes Paz, Nolon Rod, MD  tamsulosin (FLOMAX) 0.4 MG CAPS capsule Take 2 capsules (0.8 mg total) by mouth daily with supper. 07/17/20  Yes Paz, Nolon Rod, MD  traMADol (ULTRAM) 50 MG tablet Take 50 mg by mouth daily.  09/05/20  Yes [provider]  fluticasone (FLONASE) 50  MCG/ACT nasal spray Place 2 sprays into both nostrils daily. Patient not taking: Reported on 09/10/2020 07/23/19   Love, Evlyn Kanner, PA-C  HYDROcodone-acetaminophen (NORCO/VICODIN) 5-325 MG tablet Take 1 tablet by mouth every 4 (four) hours as needed for moderate pain. Patient not taking: Reported on 04/23/2020 03/12/20 03/12/21  Graceann Congress, PA-C  loratadine (CLARITIN) 10 MG tablet Take 1 tablet (10 mg total) by mouth daily. Patient not taking: Reported on 09/10/2020 07/23/19   Love, Evlyn Kanner, PA-C  sulfamethoxazole-trimethoprim (BACTRIM DS) 800-160 MG tablet Take 1 tablet by mouth 2 (two) times daily. Patient not taking: Reported on 09/10/2020 08/28/20   [name], MD    Allergies    Ace inhibitors, Dairy aid [lactase], Eggs or egg-derived products, and Latex  Review of Systems   Review of Systems  Unable to perform ROS: Dementia  Constitutional: Negative for fever.  Gastrointestinal: Positive for vomiting.  Psychiatric/Behavioral: Positive for behavioral problems.    Physical Exam Updated Vital Signs BP (!) 144/77   Pulse 83   Temp (!) 102.8 F (39.3 C) (Oral)   Resp 14   Wt 91.2 kg   SpO2 98%   BMI 25.81 kg/m   Physical Exam Vitals and nursing note reviewed. Exam conducted with a chaperone present.  Constitutional:      General: He is not in acute distress.    Appearance: Normal appearance.  HENT:     Head: Normocephalic and atraumatic.     Nose: No rhinorrhea.  Eyes:     General:        Right eye: No discharge.        Left eye: No discharge.     Conjunctiva/sclera: Conjunctivae normal.  Cardiovascular:     Rate and Rhythm: Normal rate and regular rhythm.  Pulmonary:     Effort: Pulmonary effort is normal.     Breath sounds: No stridor.  Abdominal:     General: Abdomen is flat. There is no distension.     Palpations: Abdomen is soft.     Tenderness: There is no abdominal tenderness. There is no guarding.  Musculoskeletal:        General: No deformity or signs of  injury.  Skin:    General: Skin is warm and dry.  Neurological:     Mental Status: He is alert. He is disoriented.     Motor: No weakness.  Psychiatric:        Attention and Perception: He is inattentive.        Speech: Speech is slurred.        Cognition and Memory: Cognition is impaired.     ED Results / Procedures / Treatments   Labs (all labs ordered are listed, but only abnormal results are displayed) Labs Reviewed  CBC WITH DIFFERENTIAL/PLATELET - Abnormal; Notable for the following components:      Result Value   WBC 19.9 (*)    Hemoglobin 11.6 (*)    HCT 36.5 (*)    Neutro Abs 18.3 (*)    Lymphs Abs 0.5 (*)    Abs Immature Granulocytes 0.15 (*)    All other components within normal limits  COMPREHENSIVE METABOLIC PANEL - Abnormal; Notable for the following components:   Glucose, Bld 141 (*)    Creatinine, Ser 1.26 (*)    GFR calc non Af Amer 59 (*)    All other components within normal limits  URINALYSIS, ROUTINE W REFLEX MICROSCOPIC - Abnormal; Notable for the following components:   APPearance CLOUDY (*)    Glucose, UA 50 (*)    Protein, ur 30 (*)    Nitrite POSITIVE (*)    Leukocytes,Ua LARGE (*)    WBC, UA >50 (*)    Bacteria, UA MANY (*)    Non Squamous Epithelial 0-5 (*)    All other components within normal limits  LACTIC ACID, PLASMA - Abnormal; Notable for the following components:   Lactic Acid, Venous 3.8 (*)    All other components within normal limits  LACTIC ACID, PLASMA - Abnormal; Notable for the following components:   Lactic Acid, Venous 3.9 (*)    All other components within normal limits  CORTISOL-AM, BLOOD - Abnormal; Notable for the following components:   Cortisol - AM 42.7 (*)    All other components within normal limits  CBC - Abnormal; Notable for the following components:   WBC 19.4 (*)    RBC 4.13 (*)    Hemoglobin 11.0 (*)    HCT 35.9 (*)    All other components within normal limits  COMPREHENSIVE METABOLIC PANEL - Abnormal;  Notable for the following components:   CO2 21 (*)    Glucose, Bld 104 (*)    Creatinine, Ser 1.31 (*)    Albumin 3.4 (*)    GFR calc non Af Amer 56 (*)    All other components within normal limits  LACTIC ACID, PLASMA - Abnormal; Notable for the following components:   Lactic Acid, Venous 3.8 (*)    All other components within normal limits  LACTIC ACID, PLASMA - Abnormal; Notable for the following components:   Lactic Acid, Venous 3.9 (*)    All other components within normal limits  SARS CORONAVIRUS 2 BY RT PCR (HOSPITAL ORDER, PERFORMED IN [place] [facility])  CULTURE, BLOOD (SINGLE)  CULTURE, BLOOD (ROUTINE X 2)  CULTURE, BLOOD (ROUTINE X 2)  URINE CULTURE  LIPASE, BLOOD  PROTIME-INR  PROCALCITONIN  LACTIC ACID, PLASMA  LACTIC ACID, PLASMA  HIV ANTIBODY (ROUTINE TESTING W REFLEX)  LACTIC ACID, PLASMA  LACTIC ACID, PLASMA  LACTIC ACID, PLASMA  LACTIC ACID, PLASMA  LACTIC ACID, PLASMA  LACTIC ACID, PLASMA  LACTIC ACID, PLASMA  LACTIC ACID, PLASMA    EKG None  Radiology DG Chest 1 View  Result Date: 09/10/2020 CLINICAL DATA:  Found unresponsive EXAM: CHEST  1 VIEW COMPARISON:  03/01/2020 FINDINGS: Cardiac shadow is within normal limits. Postsurgical changes are again noted and stable. Lungs are well aerated without focal infiltrate or sizable effusion. No acute bony abnormality is noted. IMPRESSION: No active disease. Electronically Signed   By: Alcide CleverMark  Lukens M.D.   On: 09/10/2020 00:31   CT Head Wo Contrast  Result Date: 09/09/2020 CLINICAL DATA:  Unresponsive EXAM: CT HEAD WITHOUT CONTRAST TECHNIQUE: Contiguous axial images were obtained from the base of the skull through the vertex without intravenous contrast. COMPARISON:  03/02/2020 FINDINGS: Brain: Chronic atrophic and ischemic changes are identified similar to that seen on the prior exam. Prominent CSF space is noted diffusely consistent with the atrophic change. No findings to suggest acute hemorrhage, acute  infarction or space-occupying mass lesion are noted. Previously seen left frontal subdural hematoma has resolved in the interval. Partially empty sella is noted as well. Vascular: No hyperdense vessel or unexpected calcification. Skull: Postsurgical changes in the left parietal region are seen. No other bony abnormality is noted. Sinuses/Orbits: Chronic mucosal changes within the ethmoid and maxillary antra stable from the prior exam. Other: None IMPRESSION: Chronic changes without acute abnormality. Electronically Signed   By: Alcide CleverMark  Lukens M.D.   On: 09/09/2020 22:34   CT ABDOMEN PELVIS W CONTRAST  Result Date: 09/09/2020 CLINICAL DATA:  Nausea, vomiting, coffee-ground emesis EXAM: CT ABDOMEN AND PELVIS WITH CONTRAST TECHNIQUE: Multidetector CT imaging of the abdomen and pelvis was performed using the standard protocol following bolus administration of intravenous contrast. CONTRAST:  100mL OMNIPAQUE IOHEXOL 300 MG/ML  SOLN COMPARISON:  None. FINDINGS: Lower chest: The visualized lung bases are clear bilaterally. At least moderate right coronary artery calcification. Cardiac size within normal limits. No pericardial effusion. Median sternotomy has been performed. Small hiatal hernia present. Hepatobiliary: No focal liver abnormality is seen. No gallstones, gallbladder wall thickening, or biliary dilatation. Pancreas: Unremarkable Spleen: Unremarkable Adrenals/Urinary Tract: Adrenal glands are unremarkable. Kidneys are normal, without renal calculi, focal lesion, or hydronephrosis. Bladder is unremarkable. Stomach/Bowel: The stomach, and small bowel are unremarkable. Appendix absent. The sigmoid colon is redundant. Moderate distal descending and proximal sigmoid diverticulosis without superimposed inflammatory change. No obstruction. Large stool within the rectal vault raising the question of fecal impaction. No perirectal inflammatory change identified. No free intraperitoneal gas or fluid. Tiny fat containing  umbilical hernia. Vascular/Lymphatic: Extensive aortoiliac atherosclerotic calcification without evidence of aneurysm. Extensive atherosclerotic calcification is seen within the common iliac arteries bilaterally and left external iliac artery, however, the degree of stenosis is not well assessed on this examination. Right femoral bypass graft is partially visualized with occlusion of the superficial femoral artery proximally. Extensive atherosclerotic calcification noted within the visualized left lower extremity arterial outflow. No pathologic adenopathy within the abdomen and pelvis. Reproductive: Prostate is unremarkable. Other: None significant Musculoskeletal: No lytic or blastic bone lesions are seen. Degenerative changes are seen within the lumbar spine. IMPRESSION: No definite radiographic explanation for the patient's symptoms. Moderate stool within the rectal vault raising the question of fecal impaction. Aortic Atherosclerosis (ICD10-I70.0). Electronically Signed   By: Helyn NumbersAshesh  Parikh MD   On: 09/09/2020 22:39    Procedures .Critical Care Performed by: Sabino DonovanKatz, Eric C, MD Authorized by: Sabino DonovanKatz, Eric C, MD   Critical care provider statement:  Critical care time (minutes):  35   Critical care was necessary to treat or prevent imminent or life-threatening deterioration of the following conditions:  Sepsis   Critical care was time spent personally by me on the following activities:  Discussions with consultants, evaluation of patient's response to treatment, examination of patient, ordering and performing treatments and interventions, ordering and review of laboratory studies, ordering and review of radiographic studies, pulse oximetry, re-evaluation of patient's condition, obtaining history from patient or surrogate and review of old charts   (including critical care time)  Medications Ordered in ED Medications  lactated ringers infusion ( Intravenous New Bag/Given 09/10/20 0827)  enoxaparin  (LOVENOX) injection 40 mg (has no administration in time range)  acetaminophen (TYLENOL) tablet 650 mg (650 mg Oral Given 09/10/20 0823)    Or  acetaminophen (TYLENOL) suppository 650 mg ( Rectal See Alternative 09/10/20 0823)  ondansetron (ZOFRAN) tablet 4 mg (has no administration in time range)    Or  ondansetron (ZOFRAN) injection 4 mg (has no administration in time range)  ceFEPIme (MAXIPIME) 2 g in sodium chloride 0.9 % 100 mL IVPB (0 g Intravenous Stopped 09/10/20 0650)  traMADol (ULTRAM) tablet 50 mg (50 mg Oral Given 09/10/20 0122)  nicotine (NICODERM CQ - dosed in mg/24 hr) patch 7 mg (has no administration in time range)  citalopram (CELEXA) tablet 10 mg (has no administration in time range)  clopidogrel (PLAVIX) tablet 75 mg (has no administration in time range)  docusate sodium (COLACE) capsule 100 mg (has no administration in time range)  gabapentin (NEURONTIN) capsule 100 mg (has no administration in time range)  melatonin tablet 6 mg (has no administration in time range)  pantoprazole (PROTONIX) EC tablet 40 mg (has no administration in time range)  QUEtiapine (SEROQUEL) tablet 200 mg (has no administration in time range)  tamsulosin (FLOMAX) capsule 0.8 mg (has no administration in time range)  lactated ringers bolus 1,000 mL (0 mLs Intravenous Stopped 09/09/20 2337)  ondansetron (ZOFRAN) injection 4 mg (4 mg Intravenous Given 09/09/20 2042)  iohexol (OMNIPAQUE) 300 MG/ML solution 100 mL (100 mLs Intravenous Contrast Given 09/09/20 2211)  lactated ringers bolus 1,000 mL (0 mLs Intravenous Stopped 09/10/20 0126)    ED Course  I have reviewed the triage vital signs and the nursing notes.  Pertinent labs & imaging results that were available during [facility] care of the patient were reviewed by me and considered in [facility] medical decision making (see chart for details).  Clinical Course as of Sep 10 906  Wynelle Link Sep 09, 2020  2323 CT ABDOMEN PELVIS W CONTRAST [EK]    Clinical Course User  Index [EK] Sabino Donovan, MD   MDM Rules/Calculators/A&P                          Patient comes with report of history of traumatic brain injury and dementia, sudden onset vomiting.  Recent falls.,  On treatment right now for UTI, normal baseline is with confusion however now his speech has been altered, family has difficulty telling onset however it seems like it has been more than a day.  They believe the UTI has some altered.  He is found in his own vomitus is nonbilious nonbloody, no diarrhea, no fevers.  His vital signs are stable here he is restless in bed and cooperative and redirectabl but cannot have a conversation.  His abdomen is nondistended and appears nontender however with his mental status changes difficult to assess.  He will get a CT scan of his abdomen pelvis with contrast CT scan of his head he would get screening labs to give antiemetics and will be given fluids  I reviewed this patient's lab and imaging myself. He has a lactic acidosis to 3.6, 1 L of crystalloid was already given a second will be given. His kidney function appears at baseline, no other significant chemistry derangements. He has a leukocytosis. His urinalysis is concerning for urinary tract infection, cultures of blood and urine were sent. Patient's mentation is much improved with hydration. Family believes he has been very well-nourished male hydrated in the last few days while he is been ill. I feel that he has urinary tract infection that is resistant to the Bactrim he has been on for the past 10 days. I consulted the medicine doctors for admission. They agree for admission, Rocephin was given in the ED. CRITICAL CARE Performed by: Sabino Donovan   Total critical care time: 53 minutes  Critical care time was exclusive of separately billable procedures and treating other patients.  Critical care was necessary to treat or prevent imminent or life-threatening deterioration.  Critical care was time spent personally  by me on the following activities: development of treatment plan with patient and/or surrogate as well as nursing, discussions with consultants, evaluation of patient's response to treatment, examination of patient, obtaining history from patient or surrogate, ordering and performing treatments and interventions, ordering and review of laboratory studies, ordering and review of radiographic studies, pulse oximetry and re-evaluation of patient's condition.   Final Clinical Impression(s) / ED Diagnoses Final diagnoses:  Vomiting in adult  Fever in adult  Acute pyelonephritis  Lactic acidosis  Sepsis, due to unspecified organism, unspecified whether acute organ dysfunction present [facility])    Rx / DC Orders ED Discharge Orders    None       Sabino Donovan, MD 09/10/20 [phone]

## 2020-09-09 NOTE — ED Notes (Signed)
Patient back from CT. LR continued. Daughter at bedside. Reinforced need for urine specimen, daughter verbalized understanding of same.

## 2020-09-09 NOTE — ED Notes (Signed)
ED Provider at bedside. 

## 2020-09-09 NOTE — ED Notes (Signed)
Patient in CT at this time.

## 2020-09-10 ENCOUNTER — Emergency Department (HOSPITAL_COMMUNITY): Payer: Medicare Other

## 2020-09-10 ENCOUNTER — Other Ambulatory Visit: Payer: Self-pay

## 2020-09-10 DIAGNOSIS — N390 Urinary tract infection, site not specified: Secondary | ICD-10-CM | POA: Diagnosis present

## 2020-09-10 DIAGNOSIS — I252 Old myocardial infarction: Secondary | ICD-10-CM | POA: Diagnosis not present

## 2020-09-10 DIAGNOSIS — G9341 Metabolic encephalopathy: Secondary | ICD-10-CM | POA: Diagnosis present

## 2020-09-10 DIAGNOSIS — G3109 Other frontotemporal dementia: Secondary | ICD-10-CM

## 2020-09-10 DIAGNOSIS — I10 Essential (primary) hypertension: Secondary | ICD-10-CM

## 2020-09-10 DIAGNOSIS — E785 Hyperlipidemia, unspecified: Secondary | ICD-10-CM | POA: Diagnosis present

## 2020-09-10 DIAGNOSIS — N179 Acute kidney failure, unspecified: Secondary | ICD-10-CM | POA: Diagnosis present

## 2020-09-10 DIAGNOSIS — E872 Acidosis: Secondary | ICD-10-CM | POA: Diagnosis present

## 2020-09-10 DIAGNOSIS — R6520 Severe sepsis without septic shock: Secondary | ICD-10-CM | POA: Diagnosis present

## 2020-09-10 DIAGNOSIS — S82001A Unspecified fracture of right patella, initial encounter for closed fracture: Secondary | ICD-10-CM | POA: Diagnosis present

## 2020-09-10 DIAGNOSIS — I2510 Atherosclerotic heart disease of native coronary artery without angina pectoris: Secondary | ICD-10-CM

## 2020-09-10 DIAGNOSIS — D649 Anemia, unspecified: Secondary | ICD-10-CM | POA: Diagnosis present

## 2020-09-10 DIAGNOSIS — W19XXXA Unspecified fall, initial encounter: Secondary | ICD-10-CM | POA: Diagnosis present

## 2020-09-10 DIAGNOSIS — K5900 Constipation, unspecified: Secondary | ICD-10-CM | POA: Diagnosis present

## 2020-09-10 DIAGNOSIS — A4151 Sepsis due to Escherichia coli [E. coli]: Secondary | ICD-10-CM | POA: Diagnosis present

## 2020-09-10 DIAGNOSIS — Z951 Presence of aortocoronary bypass graft: Secondary | ICD-10-CM | POA: Diagnosis not present

## 2020-09-10 DIAGNOSIS — S82001D Unspecified fracture of right patella, subsequent encounter for closed fracture with routine healing: Secondary | ICD-10-CM

## 2020-09-10 DIAGNOSIS — Z1629 Resistance to other single specified antibiotic: Secondary | ICD-10-CM | POA: Diagnosis present

## 2020-09-10 DIAGNOSIS — K219 Gastro-esophageal reflux disease without esophagitis: Secondary | ICD-10-CM | POA: Diagnosis present

## 2020-09-10 DIAGNOSIS — Z20822 Contact with and (suspected) exposure to covid-19: Secondary | ICD-10-CM | POA: Diagnosis present

## 2020-09-10 DIAGNOSIS — I255 Ischemic cardiomyopathy: Secondary | ICD-10-CM | POA: Diagnosis present

## 2020-09-10 DIAGNOSIS — F17210 Nicotine dependence, cigarettes, uncomplicated: Secondary | ICD-10-CM | POA: Diagnosis present

## 2020-09-10 DIAGNOSIS — I739 Peripheral vascular disease, unspecified: Secondary | ICD-10-CM

## 2020-09-10 DIAGNOSIS — Z8249 Family history of ischemic heart disease and other diseases of the circulatory system: Secondary | ICD-10-CM | POA: Diagnosis not present

## 2020-09-10 DIAGNOSIS — N400 Enlarged prostate without lower urinary tract symptoms: Secondary | ICD-10-CM | POA: Diagnosis present

## 2020-09-10 DIAGNOSIS — F0280 Dementia in other diseases classified elsewhere without behavioral disturbance: Secondary | ICD-10-CM | POA: Diagnosis present

## 2020-09-10 DIAGNOSIS — A419 Sepsis, unspecified organism: Secondary | ICD-10-CM

## 2020-09-10 DIAGNOSIS — G629 Polyneuropathy, unspecified: Secondary | ICD-10-CM | POA: Diagnosis present

## 2020-09-10 LAB — CBC
HCT: 35.9 % — ABNORMAL LOW (ref 39.0–52.0)
Hemoglobin: 11 g/dL — ABNORMAL LOW (ref 13.0–17.0)
MCH: 26.6 pg (ref 26.0–34.0)
MCHC: 30.6 g/dL (ref 30.0–36.0)
MCV: 86.9 fL (ref 80.0–100.0)
Platelets: 310 10*3/uL (ref 150–400)
RBC: 4.13 MIL/uL — ABNORMAL LOW (ref 4.22–5.81)
RDW: 14.5 % (ref 11.5–15.5)
WBC: 19.4 10*3/uL — ABNORMAL HIGH (ref 4.0–10.5)
nRBC: 0 % (ref 0.0–0.2)

## 2020-09-10 LAB — CORTISOL-AM, BLOOD: Cortisol - AM: 42.7 ug/dL — ABNORMAL HIGH (ref 6.7–22.6)

## 2020-09-10 LAB — LACTIC ACID, PLASMA
Lactic Acid, Venous: 2.2 mmol/L (ref 0.5–1.9)
Lactic Acid, Venous: 2.3 mmol/L (ref 0.5–1.9)
Lactic Acid, Venous: 2.3 mmol/L (ref 0.5–1.9)
Lactic Acid, Venous: 2.4 mmol/L (ref 0.5–1.9)
Lactic Acid, Venous: 3.8 mmol/L (ref 0.5–1.9)
Lactic Acid, Venous: 3.9 mmol/L (ref 0.5–1.9)
Lactic Acid, Venous: 3.9 mmol/L (ref 0.5–1.9)
Lactic Acid, Venous: 3.9 mmol/L (ref 0.5–1.9)
Lactic Acid, Venous: 4.1 mmol/L (ref 0.5–1.9)

## 2020-09-10 LAB — SARS CORONAVIRUS 2 BY RT PCR (HOSPITAL ORDER, PERFORMED IN ~~LOC~~ HOSPITAL LAB): SARS Coronavirus 2: NEGATIVE

## 2020-09-10 LAB — COMPREHENSIVE METABOLIC PANEL
ALT: 33 U/L (ref 0–44)
AST: 36 U/L (ref 15–41)
Albumin: 3.4 g/dL — ABNORMAL LOW (ref 3.5–5.0)
Alkaline Phosphatase: 87 U/L (ref 38–126)
Anion gap: 12 (ref 5–15)
BUN: 17 mg/dL (ref 8–23)
CO2: 21 mmol/L — ABNORMAL LOW (ref 22–32)
Calcium: 9.3 mg/dL (ref 8.9–10.3)
Chloride: 102 mmol/L (ref 98–111)
Creatinine, Ser: 1.31 mg/dL — ABNORMAL HIGH (ref 0.61–1.24)
GFR calc Af Amer: >60 mL/min (ref >60)
GFR calc non Af Amer: 56 mL/min — ABNORMAL LOW (ref >60)
Glucose, Bld: 104 mg/dL — ABNORMAL HIGH (ref 70–99)
Potassium: 4.2 mmol/L (ref 3.5–5.1)
Sodium: 135 mmol/L (ref 135–145)
Total Bilirubin: 1 mg/dL (ref 0.3–1.2)
Total Protein: 6.6 g/dL (ref 6.5–8.1)

## 2020-09-10 LAB — PROTIME-INR
INR: 1.1 (ref 0.8–1.2)
Prothrombin Time: 14 seconds (ref 11.4–15.2)

## 2020-09-10 LAB — HIV ANTIBODY (ROUTINE TESTING W REFLEX): HIV Screen 4th Generation wRfx: NONREACTIVE

## 2020-09-10 LAB — PROCALCITONIN: Procalcitonin: 7.8 ng/mL

## 2020-09-10 IMAGING — CR DG CHEST 1V
1 series · 1 of 1 positions shown · non-contrast
Comparison: [DATE]

CLINICAL DATA: Found unresponsive

EXAM:
CHEST  1 VIEW

[chest ap]
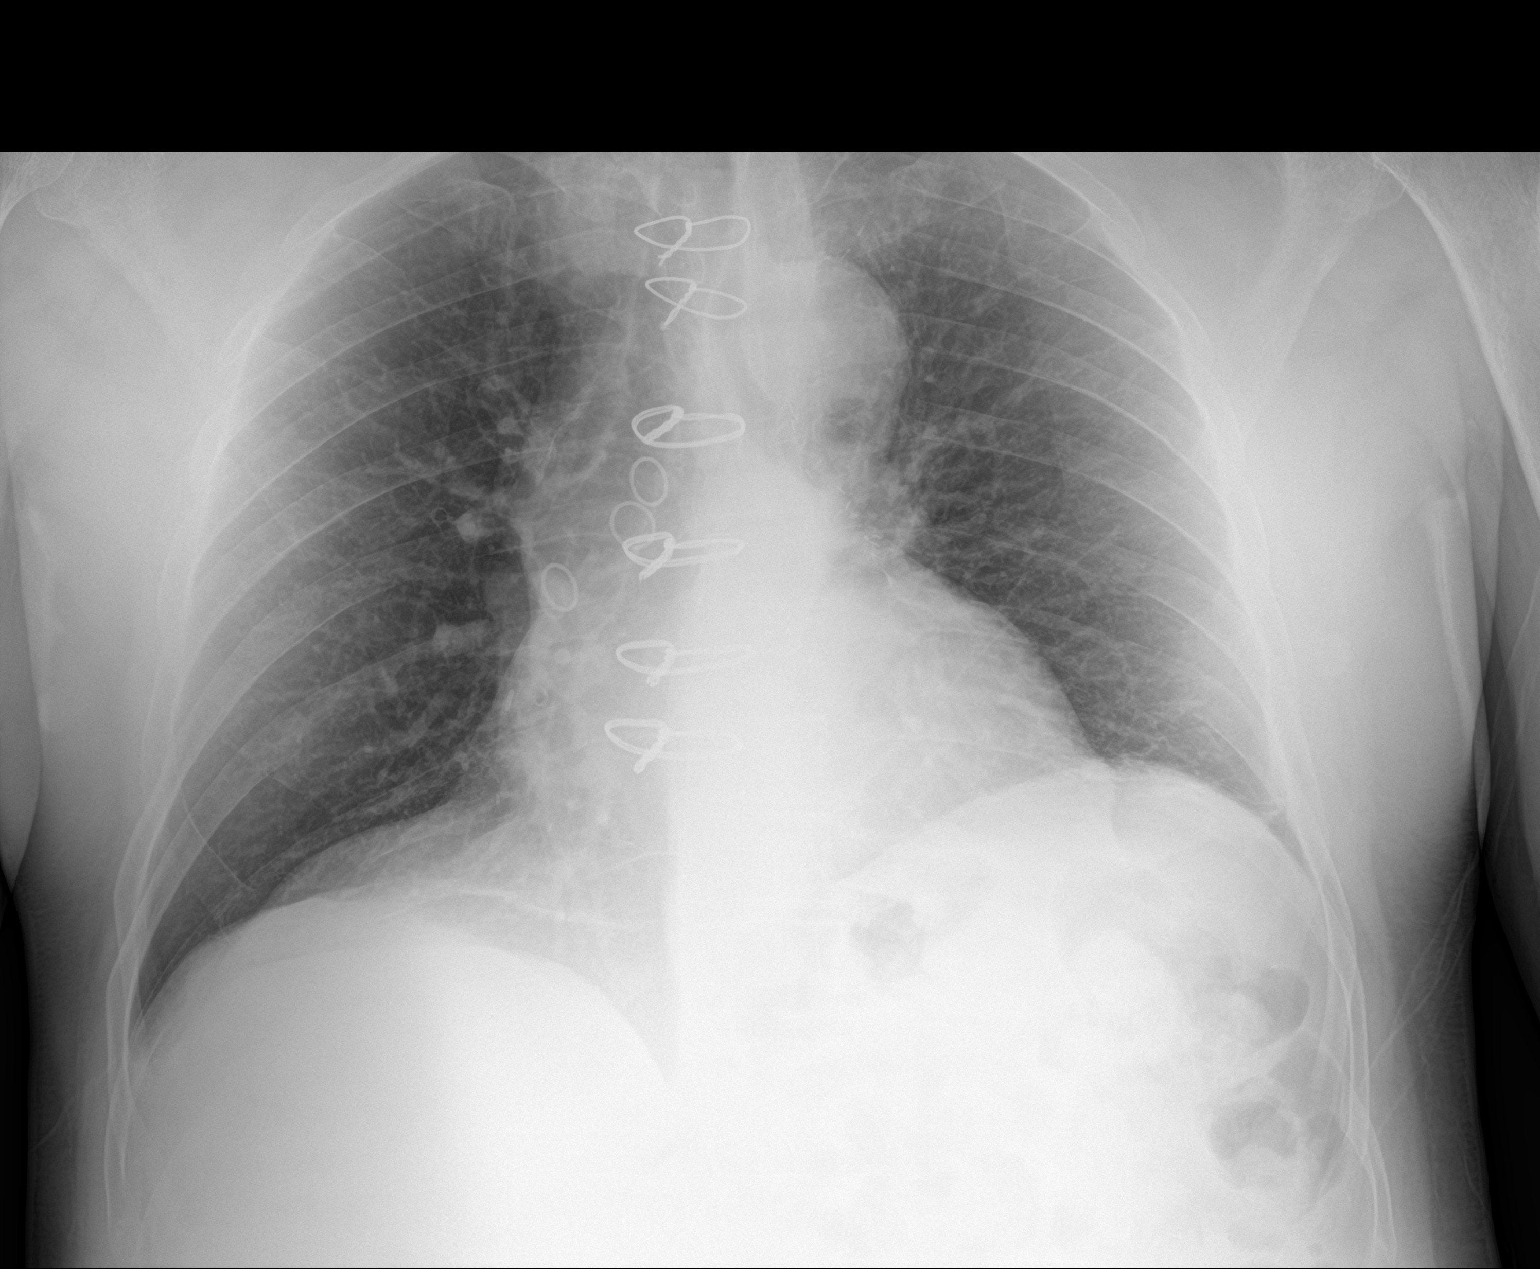

[1 of 1 positions shown; findings below may reference images not displayed]

FINDINGS: Cardiac shadow is within normal limits. Postsurgical changes are
again noted and stable. Lungs are well aerated without focal
infiltrate or sizable effusion. No acute bony abnormality is noted.
IMPRESSION: No active disease.

## 2020-09-10 MED ORDER — HYDROCODONE-ACETAMINOPHEN 5-325 MG PO TABS: 1.0000 | ORAL_TABLET | ORAL | Status: DC | PRN

## 2020-09-10 MED ORDER — ACETAMINOPHEN 650 MG RE SUPP: 650.0000 mg | Freq: Four times a day (QID) | RECTAL | Status: DC | PRN

## 2020-09-10 MED ORDER — DOCUSATE SODIUM 100 MG PO CAPS
100.0000 mg | ORAL_CAPSULE | Freq: Every day | ORAL | Status: DC
  Administered 2020-09-10 – 2020-09-12 (×3): 100 mg via ORAL
  Filled 2020-09-10 (×3): qty 1

## 2020-09-10 MED ORDER — ENOXAPARIN SODIUM 40 MG/0.4ML ~~LOC~~ SOLN
40.0000 mg | SUBCUTANEOUS | Status: DC
  Administered 2020-09-10 – 2020-09-12 (×3): 40 mg via SUBCUTANEOUS
  Filled 2020-09-10 (×3): qty 0.4

## 2020-09-10 MED ORDER — NICOTINE 7 MG/24HR TD PT24
7.0000 mg | MEDICATED_PATCH | Freq: Every day | TRANSDERMAL | Status: DC
  Administered 2020-09-10 – 2020-09-12 (×3): 7 mg via TRANSDERMAL
  Filled 2020-09-10 (×4): qty 1

## 2020-09-10 MED ORDER — ACETAMINOPHEN 325 MG PO TABS: 650.0000 mg | ORAL_TABLET | Freq: Four times a day (QID) | ORAL | Status: DC | PRN

## 2020-09-10 MED ORDER — PANTOPRAZOLE SODIUM 40 MG PO TBEC
40.0000 mg | DELAYED_RELEASE_TABLET | Freq: Every day | ORAL | Status: DC
  Administered 2020-09-10 – 2020-09-11 (×2): 40 mg via ORAL
  Filled 2020-09-10 (×2): qty 1

## 2020-09-10 MED ORDER — ONDANSETRON HCL 4 MG PO TABS: 4.0000 mg | ORAL_TABLET | Freq: Four times a day (QID) | ORAL | Status: DC | PRN

## 2020-09-10 MED ORDER — QUETIAPINE FUMARATE 100 MG PO TABS
200.0000 mg | ORAL_TABLET | Freq: Two times a day (BID) | ORAL | Status: DC
  Administered 2020-09-10 – 2020-09-12 (×5): 200 mg via ORAL
  Filled 2020-09-10: qty 1
  Filled 2020-09-10: qty 2
  Filled 2020-09-10: qty 1
  Filled 2020-09-10 (×2): qty 2
  Filled 2020-09-10: qty 1

## 2020-09-10 MED ORDER — ACETAMINOPHEN 325 MG PO TABS
650.0000 mg | ORAL_TABLET | Freq: Four times a day (QID) | ORAL | Status: DC | PRN
  Administered 2020-09-10: 650 mg via ORAL
  Filled 2020-09-10: qty 2

## 2020-09-10 MED ORDER — SODIUM CHLORIDE 0.9 % IV SOLN
2.0000 g | Freq: Three times a day (TID) | INTRAVENOUS | Status: DC
  Administered 2020-09-10 – 2020-09-11 (×6): 2 g via INTRAVENOUS
  Filled 2020-09-10 (×6): qty 2

## 2020-09-10 MED ORDER — GABAPENTIN 100 MG PO CAPS
100.0000 mg | ORAL_CAPSULE | Freq: Two times a day (BID) | ORAL | Status: DC
  Administered 2020-09-10 – 2020-09-12 (×5): 100 mg via ORAL
  Filled 2020-09-10 (×5): qty 1

## 2020-09-10 MED ORDER — TAMSULOSIN HCL 0.4 MG PO CAPS
0.8000 mg | ORAL_CAPSULE | Freq: Every day | ORAL | Status: DC
  Administered 2020-09-10 – 2020-09-12 (×3): 0.8 mg via ORAL
  Filled 2020-09-10 (×3): qty 2

## 2020-09-10 MED ORDER — CITALOPRAM HYDROBROMIDE 20 MG PO TABS
10.0000 mg | ORAL_TABLET | Freq: Every day | ORAL | Status: DC
  Administered 2020-09-10 – 2020-09-12 (×3): 10 mg via ORAL
  Filled 2020-09-10 (×3): qty 1

## 2020-09-10 MED ORDER — MELATONIN 3 MG PO TABS
6.0000 mg | ORAL_TABLET | Freq: Every day | ORAL | Status: DC
  Administered 2020-09-10 – 2020-09-11 (×2): 6 mg via ORAL
  Filled 2020-09-10 (×2): qty 2

## 2020-09-10 MED ORDER — TRAMADOL HCL 50 MG PO TABS
50.0000 mg | ORAL_TABLET | Freq: Four times a day (QID) | ORAL | Status: DC | PRN
  Administered 2020-09-10 – 2020-09-11 (×3): 50 mg via ORAL
  Filled 2020-09-10 (×3): qty 1

## 2020-09-10 MED ORDER — ONDANSETRON HCL 4 MG/2ML IJ SOLN: 4.0000 mg | Freq: Four times a day (QID) | INTRAMUSCULAR | Status: DC | PRN

## 2020-09-10 MED ORDER — CLOPIDOGREL BISULFATE 75 MG PO TABS
75.0000 mg | ORAL_TABLET | Freq: Every day | ORAL | Status: DC
  Administered 2020-09-10 – 2020-09-12 (×3): 75 mg via ORAL
  Filled 2020-09-10 (×3): qty 1

## 2020-09-10 MED ORDER — TRAMADOL HCL 50 MG PO TABS
50.0000 mg | ORAL_TABLET | Freq: Four times a day (QID) | ORAL | Status: DC | PRN
Start: 2020-09-10 — End: 2020-09-10

## 2020-09-10 NOTE — ED Notes (Signed)
Pts daughter [name]) leaving, number in chart under pt contacts if needed.

## 2020-09-10 NOTE — Progress Notes (Signed)
Notified provider of need to order repeat lactic acid as second one increased to 3.9.

## 2020-09-10 NOTE — ED Notes (Signed)
Pt is difficult stick will reach out to phlebotomy for repeat lactic blood draw

## 2020-09-10 NOTE — H&P (Addendum)
History and Physical    Lonnie [name]:[id number] DOB: 1951/04/06 DOA: 09/09/2020  PCP: [name], MD  Patient coming from: Home  I have personally briefly reviewed patient's old medical records in [facility]  Chief Complaint: N/V, AMS  HPI: Lonnie [name] is a 69 y.o. male with medical history significant of CAD s/p CABG, HTN, PAD, ICM, fronto-temporal dementia.  Pt fell Monday, broke R patella, ortho trying to avoid surgery at this time so PT leg in knee immobilizer / brace.  Pt has had UTI symptoms earlier this month, treated for 10 days with ABx (took 5 days of "antibiotics" and 5 more days of bactrim which Dr. Drue Novel prescribed).  Pt presents to ED today after wife found him on floor in his own emesis.  Seems as though he slid off of recliner.  No blood nor coffee ground emesis.  Pt alert but confused.  Pt reports no hip pain, "just knee" pain.   ED Course: Work up in ED reveals pt actually meets sepsis criteria: WBC 19.9k, Tm 100.6, HR 85-100, BP 141/61.  satting 98% on RA.  Lactate 3.8.  Creat 1.2 (1.0 baseline).  Apparent source is UTI. With nitrites, large LE and > 50 WBC on UA.  CT abd/pelvis neg.  CXR neg.  EDP ordered empiric rocephin.   Review of Systems: As per HPI, otherwise all review of systems negative.  Past Medical History:  Diagnosis Date  . Allergy   . Anxiety   . Arthritis    left neck, shoulder, knee  . CAD (coronary artery disease)    a. anterior STEMI 10/2013 s/p 4V CABG with LIMA to mid LAD, SVG to OM, SVG to PDA, SVG to Diagonal.  . Dementia (HCC)   . Depression   . Falls   . Hypertension   . Ischemic cardiomyopathy    a. EF 40-45% at time of CABG and in 2018.  . MI (myocardial infarction) (HCC)   . Pre-diabetes   . Prediabetes 09/01/2014  . PVD (peripheral vascular disease) (HCC)    a. s/p L SFA stents with now known bilateral SFA occlusion treated medically.  . Stroke [facility])    seen on CT Scan  . Tobacco abuse       Past Surgical History:  Procedure Laterality Date  . ABDOMINAL AORTAGRAM N/A 06/07/2014   Procedure: ABDOMINAL Ronny Flurry;  Surgeon: Iran Ouch, MD;  Location: [facility] CATH LAB;  Service: Cardiovascular;  Laterality: N/A;  . ABDOMINAL AORTAGRAM N/A 03/07/2015   Procedure: ABDOMINAL Ronny Flurry;  Surgeon: Iran Ouch, MD;  Location: [facility] CATH LAB;  Service: Cardiovascular;  Laterality: N/A;  . ABDOMINAL AORTOGRAM W/LOWER EXTREMITY N/A 01/18/2020   Procedure: ABDOMINAL AORTOGRAM W/LOWER EXTREMITY - Right;  Surgeon: Iran Ouch, MD;  Location: [facility] INVASIVE CV LAB;  Service: Cardiovascular;  Laterality: N/A;  . APPENDECTOMY    . COLONOSCOPY WITH PROPOFOL N/A 10/11/2015   Procedure: COLONOSCOPY WITH PROPOFOL;  Surgeon: Charna Elizabeth, MD;  Location: [facility] ENDOSCOPY;  Service: Endoscopy;  Laterality: N/A;  . CORONARY ARTERY BYPASS GRAFT N/A 11/04/2013   Procedure: CORONARY ARTERY BYPASS GRAFTING (CABG) TIMES FOUR  USING LEFT INTERNAL MAMMARY ARTERY AND RIGHT AND LEFT SAPHENOUS LEG VEIN HARVESTED ENDOSCOPICALLY;  Surgeon: Loreli Slot, MD;  Location: [facility] OR;  Service: Open Heart Surgery;  Laterality: N/A;  . CRANIOTOMY Left 06/30/2019   Procedure: Frontal CRANIOTOMY HEMATOMA EVACUATION SUBDURAL;  Surgeon: Lisbeth Renshaw, MD;  Location: [facility] OR;  Service: Neurosurgery;  Laterality: Left;  Frontal CRANIOTOMY HEMATOMA EVACUATION SUBDURAL  . FEMORAL-POPLITEAL BYPASS GRAFT Right 01/20/2020   Procedure: BYPASS GRAFT FEMORAL-POPLITEAL ARTERY RIGHT LEG USING GORE PROPATEN GRAFT;  Surgeon: Nada Libman, MD;  Location: [facility] OR;  Service: Vascular;  Laterality: Right;  . FINGER SURGERY  2017   injury  . I & D EXTREMITY Right 03/02/2020   Procedure: IRRIGATION AND DEBRIDEMENT EXTREMITY right  lower leg  with Antibiotic beads.;  Surgeon: Chuck Hint, MD;  Location: [facility] OR;  Service: Vascular;  Laterality: Right;  . KNEE SURGERY     fractured patella  . LEFT HEART CATH N/A 10/29/2013    Procedure: LEFT HEART CATH;  Surgeon: Kathleene Hazel, MD;  Location: [facility] CATH LAB;  Service: Cardiovascular;  Laterality: N/A;  . LEFT HEART CATHETERIZATION WITH CORONARY ANGIOGRAM N/A 10/31/2013   Procedure: LEFT HEART CATHETERIZATION WITH CORONARY ANGIOGRAM;  Surgeon: Kathleene Hazel, MD;  Location: [facility] CATH LAB;  Service: Cardiovascular;  Laterality: N/A;  . MOUTH SURGERY    . TOE SURGERY       reports that he has been smoking cigarettes. He has a 7.50 pack-year smoking history. He has never used smokeless tobacco. He reports that he does not drink alcohol and does not use drugs.  Allergies  Allergen Reactions  . Ace Inhibitors Swelling    Angioedema  . Dairy Aid [Lactase]     gas  . Eggs Or Egg-Derived Products     Cannot eat Prepared Eggs  . Latex Itching    Family History  Problem Relation Age of Onset  . CAD Father 37  . AAA (abdominal aortic aneurysm) Father   . Alcohol abuse Father   . Hypertension Father   . Diabetes Father   . Heart attack Father   . Stroke Mother   . Arthritis Mother   . Heart disease Mother   . Hypertension Mother   . Heart attack Mother   . Cardiomyopathy Daughter   . Colon cancer Neg Hx   . Prostate cancer Neg Hx      Prior to Admission medications   Medication Sig Start Date End Date Taking? Authorizing Provider  acetaminophen (TYLENOL) 325 MG tablet Take 2 tablets (650 mg total) by mouth 4 (four) times daily -  before meals and at bedtime. Patient taking differently: Take 650 mg by mouth 4 (four) times daily as needed (pain.).  07/22/19   Love, Evlyn Kanner, PA-C  aspirin EC 81 MG tablet Take 81 mg by mouth daily.    [provider]  atorvastatin (LIPITOR) 80 MG tablet TAKE 1 TABLET BY MOUTH  DAILY AT 6 PM 07/17/20   [name], MD  Calcium Carb-Cholecalciferol (CALCIUM 600+D) 600-800 MG-UNIT TABS Take 1 tablet by mouth daily.    [provider]  citalopram (CELEXA) 20 MG tablet Take 1 tablet (20 mg total) by  mouth daily. 07/17/20   [name], MD  clonazePAM (KLONOPIN) 0.25 MG disintegrating tablet Take 1 tablet (0.25 mg total) by mouth 2 (two) times daily as needed. 07/17/20   [name], MD  clopidogrel (PLAVIX) 75 MG tablet Take 1 tablet (75 mg total) by mouth daily. 01/23/20   Setzer, Lynnell Jude, PA-C  fluticasone (FLONASE) 50 MCG/ACT nasal spray Place 2 sprays into both nostrils daily. Patient taking differently: Place 2 sprays into both nostrils daily as needed for allergies.  07/23/19   Love, Evlyn Kanner, PA-C  gabapentin (NEURONTIN) 100 MG capsule Take 1 capsule (100 mg total) by mouth  2 (two) times daily. 07/17/20   [name], MD  HYDROcodone-acetaminophen (NORCO/VICODIN) 5-325 MG tablet Take 1 tablet by mouth every 4 (four) hours as needed for moderate pain. Patient not taking: Reported on 04/23/2020 03/12/20 03/12/21  Graceann Congress, PA-C  irbesartan (AVAPRO) 300 MG tablet Take 1 tablet (300 mg total) by mouth daily. 07/17/20   [name], MD  loratadine (CLARITIN) 10 MG tablet Take 1 tablet (10 mg total) by mouth daily. 07/23/19   Love, Evlyn Kanner, PA-C  Melatonin 3 MG TABS Take 1 tablet by mouth at bedtime.    [provider]  metoprolol tartrate (LOPRESSOR) 25 MG tablet Take 1 tablet (25 mg total) by mouth 2 (two) times daily. 07/17/20   [name], MD  pantoprazole (PROTONIX) 40 MG tablet Take 1 tablet (40 mg total) by mouth at bedtime. 07/17/20   [name], MD  QUEtiapine (SEROQUEL) 200 MG tablet Take 1 tablet (200 mg total) by mouth 2 (two) times daily. 08/29/20   [name], MD  sulfamethoxazole-trimethoprim (BACTRIM DS) 800-160 MG tablet Take 1 tablet by mouth 2 (two) times daily. 08/28/20   [name], MD  tamsulosin (FLOMAX) 0.4 MG CAPS capsule Take 2 capsules (0.8 mg total) by mouth daily with supper. 07/17/20   [name], MD    Physical Exam: Vitals:   09/09/20 1923 09/09/20 2236  BP: (!) 161/87 (!) 141/61  Pulse: 100 85  Resp: 16 18  Temp: 98.9 F (37.2 C) 100.3 F (37.9 C)    TempSrc: Oral Oral  SpO2: 98% 98%  Weight:  91.2 kg    Constitutional: Pt uncomfortable due to knee pain Eyes: PERRL, lids and conjunctivae normal ENMT: Mucous membranes are moist. Posterior pharynx clear of any exudate or lesions.Normal dentition.  Neck: normal, supple, no masses, no thyromegaly Respiratory: clear to auscultation bilaterally, no wheezing, no crackles. Normal respiratory effort. No accessory muscle use.  Cardiovascular: Regular rate and rhythm, no murmurs / rubs / gallops. No extremity edema. 2+ pedal pulses. No carotid bruits.  Abdomen: no tenderness, no masses palpated. No hepatosplenomegaly. Bowel sounds positive.  Musculoskeletal: RLE knee in brace, mild swelling, mild warmth, no erythema,  No hip pain or tenderness, "just knee" per pt. Skin: graft site looks well healed, no obvious cellulitis or infection Neurologic: CN 2-12 grossly intact. Sensation intact, DTR normal. Strength 5/5 in all 4.  Psychiatric: Alert, but confused, does answer questions though.   Labs on Admission: I have personally reviewed following labs and imaging studies  CBC: Recent Labs  Lab 09/09/20 2108  WBC 19.9*  NEUTROABS 18.3*  HGB 11.6*  HCT 36.5*  MCV 86.1  PLT 354   Basic Metabolic Panel: Recent Labs  Lab 09/09/20 2108  NA 136  K 4.4  CL 102  CO2 23  GLUCOSE 141*  BUN 19  CREATININE 1.26*  CALCIUM 9.6   GFR: Estimated Creatinine Clearance: 66.1 mL/min (A) (by C-G formula based on SCr of 1.26 mg/dL (H)). Liver Function Tests: Recent Labs  Lab 09/09/20 2108  AST 32  ALT 36  ALKPHOS 99  BILITOT 0.9  PROT 7.0  ALBUMIN 3.8   Recent Labs  Lab 09/09/20 2108  LIPASE 21   No results for input(s): AMMONIA in the last 168 hours. Coagulation Profile: No results for input(s): INR, PROTIME in the last 168 hours. Cardiac Enzymes: No results for input(s): CKTOTAL, CKMB, CKMBINDEX, TROPONINI in the last 168 hours. BNP (last 3 results) No results for  input(s):  PROBNP in the last 8760 hours. HbA1C: No results for input(s): HGBA1C in the last 72 hours. CBG: No results for input(s): GLUCAP in the last 168 hours. Lipid Profile: No results for input(s): CHOL, HDL, LDLCALC, TRIG, CHOLHDL, LDLDIRECT in the last 72 hours. Thyroid Function Tests: No results for input(s): TSH, T4TOTAL, FREET4, T3FREE, THYROIDAB in the last 72 hours. Anemia Panel: No results for input(s): VITAMINB12, FOLATE, FERRITIN, TIBC, IRON, RETICCTPCT in the last 72 hours. Urine analysis:    Component Value Date/Time   COLORURINE YELLOW 09/09/2020 2246   APPEARANCEUR CLOUDY (A) 09/09/2020 2246   LABSPEC 1.027 09/09/2020 2246   PHURINE 5.0 09/09/2020 2246   GLUCOSEU 50 (A) 09/09/2020 2246   GLUCOSEU NEGATIVE 04/26/2020 1203   HGBUR NEGATIVE 09/09/2020 2246   BILIRUBINUR NEGATIVE 09/09/2020 2246   KETONESUR NEGATIVE 09/09/2020 2246   PROTEINUR 30 (A) 09/09/2020 2246   UROBILINOGEN 0.2 04/26/2020 1203   NITRITE POSITIVE (A) 09/09/2020 2246   LEUKOCYTESUR LARGE (A) 09/09/2020 2246    Radiological Exams on Admission: DG Chest 1 View  Result Date: 09/10/2020 CLINICAL DATA:  Found unresponsive EXAM: CHEST  1 VIEW COMPARISON:  03/01/2020 FINDINGS: Cardiac shadow is within normal limits. Postsurgical changes are again noted and stable. Lungs are well aerated without focal infiltrate or sizable effusion. No acute bony abnormality is noted. IMPRESSION: No active disease. Electronically Signed   By: Alcide Clever M.D.   On: 09/10/2020 00:31   CT Head Wo Contrast  Result Date: 09/09/2020 CLINICAL DATA:  Unresponsive EXAM: CT HEAD WITHOUT CONTRAST TECHNIQUE: Contiguous axial images were obtained from the base of the skull through the vertex without intravenous contrast. COMPARISON:  03/02/2020 FINDINGS: Brain: Chronic atrophic and ischemic changes are identified similar to that seen on the prior exam. Prominent CSF space is noted diffusely consistent with the atrophic change. No findings  to suggest acute hemorrhage, acute infarction or space-occupying mass lesion are noted. Previously seen left frontal subdural hematoma has resolved in the interval. Partially empty sella is noted as well. Vascular: No hyperdense vessel or unexpected calcification. Skull: Postsurgical changes in the left parietal region are seen. No other bony abnormality is noted. Sinuses/Orbits: Chronic mucosal changes within the ethmoid and maxillary antra stable from the prior exam. Other: None IMPRESSION: Chronic changes without acute abnormality. Electronically Signed   By: Alcide Clever M.D.   On: 09/09/2020 22:34   CT ABDOMEN PELVIS W CONTRAST  Result Date: 09/09/2020 CLINICAL DATA:  Nausea, vomiting, coffee-ground emesis EXAM: CT ABDOMEN AND PELVIS WITH CONTRAST TECHNIQUE: Multidetector CT imaging of the abdomen and pelvis was performed using the standard protocol following bolus administration of intravenous contrast. CONTRAST:  OMNIPAQUE IOHEXOL 300 MG/ML  SOLN COMPARISON:  None. FINDINGS: Lower chest: The visualized lung bases are clear bilaterally. At least moderate right coronary artery calcification. Cardiac size within normal limits. No pericardial effusion. Median sternotomy has been performed. Small hiatal hernia present. Hepatobiliary: No focal liver abnormality is seen. No gallstones, gallbladder wall thickening, or biliary dilatation. Pancreas: Unremarkable Spleen: Unremarkable Adrenals/Urinary Tract: Adrenal glands are unremarkable. Kidneys are normal, without renal calculi, focal lesion, or hydronephrosis. Bladder is unremarkable. Stomach/Bowel: The stomach, and small bowel are unremarkable. Appendix absent. The sigmoid colon is redundant. Moderate distal descending and proximal sigmoid diverticulosis without superimposed inflammatory change. No obstruction. Large stool within the rectal vault raising the question of fecal impaction. No perirectal inflammatory change identified. No free intraperitoneal  gas or fluid. Tiny fat containing umbilical hernia. Vascular/Lymphatic: Extensive aortoiliac atherosclerotic  calcification without evidence of aneurysm. Extensive atherosclerotic calcification is seen within the common iliac arteries bilaterally and left external iliac artery, however, the degree of stenosis is not well assessed on this examination. Right femoral bypass graft is partially visualized with occlusion of the superficial femoral artery proximally. Extensive atherosclerotic calcification noted within the visualized left lower extremity arterial outflow. No pathologic adenopathy within the abdomen and pelvis. Reproductive: Prostate is unremarkable. Other: None significant Musculoskeletal: No lytic or blastic bone lesions are seen. Degenerative changes are seen within the lumbar spine. IMPRESSION: No definite radiographic explanation for the patient's symptoms. Moderate stool within the rectal vault raising the question of fecal impaction. Aortic Atherosclerosis (ICD10-I70.0). Electronically Signed   By: Helyn NumbersAshesh  Parikh MD   On: 09/09/2020 22:39    EKG: Independently reviewed.  Assessment/Plan Principal Problem:   Sepsis secondary to UTI [facility](HCC) Active Problems:   HTN (hypertension)   Coronary atherosclerosis of native coronary artery   Fronto-temporal dementia (HCC)   PAD (peripheral artery disease) (HCC)   Acute metabolic encephalopathy   Closed fracture of right patella    1. Sepsis secondary to UTI - failed outpt ABx treatment 1. Sepsis pathway 2. IVF: got 2L LR bolus in ED and LR at 125 cc/hr 3. Tele monitor 4. UCx pending, BCx pending 5. H/o pseudomonas UTI in July 2020, and pseudomonas post op wound infection of fem-pop graft site in March 2021. 1. Will put pt on cefepime instead of rocephin for the moment 6. Serial lactates 2. Acute encephalopathy on top of chronic dementia - 1. Due to sepsis above 2. Cont home meds when med-rec completed 3. R patella fx - 1. Saw ortho  earlier this week 2. In knee immobilizer / brace, continue this 3. Offered norco for pain but family says its too strong and that tramadol works fine (will order this PRN instead). 4. No hip pain or tenderness 4. CAD / PAD - 1. Med rec pending 5. HTN - 1. Med rec pending, but probably will hold BP meds at least to start until we know which way BP will go with sepsis  DVT prophylaxis: Lovenox Code Status: Full Family Communication: Family at bedside Disposition Plan: TBD, pending improvement of sepsis and UTI Consults called: None Admission status: Admit to inpatient  Severity of Illness: The appropriate patient status for this patient is INPATIENT. Inpatient status is judged to be reasonable and necessary in order to provide the required intensity of service to ensure the patient's safety. The patient's presenting symptoms, physical exam findings, and initial radiographic and laboratory data in the context of their chronic comorbidities is felt to place them at high risk for further clinical deterioration. Furthermore, it is not anticipated that the patient will be medically stable for discharge from the hospital within 2 midnights of admission. The following factors support the patient status of inpatient.   IP status for: 1) UTI with sepsis, AMS, lactate of 3.8. 2) failed outpt antibiotic course for said UTI  * I certify that at the point of admission it is my clinical judgment that the patient will require inpatient hospital care spanning beyond 2 midnights from the point of admission due to high intensity of service, high risk for further deterioration and high frequency of surveillance required.*    Nghia Mcentee M. DO [facility]  How to contact the [facility] Attending or Consulting provider 7A - 7P or covering provider during after hours 7P -7A, for this patient?  1. Check the care team in [facility]  and look for a) attending/consulting [facility] provider listed and b) the [facility] team  listed 2. Log into [email or web address]  Amion Physician Scheduling and messaging for groups and whole hospitals  On call and physician scheduling software for group practices, residents, hospitalists and other medical providers for call, clinic, rotation and shift schedules. On[employer] is a hospital-wide system for scheduling doctors and paging doctors on call. EasyPlot is for scientific plotting and data analysis.  [email or web address]  and use [place] [facility]'s universal password to access. If you do not have the password, please contact the hospital operator.  3. Locate the [facility] provider you are looking for under [facility] and page to a number that you can be directly reached. 4. If you still have difficulty reaching the provider, please page the [facility] (Director on Call) for the Hospitalists listed on amion for assistance.  09/10/2020, 12:38 AM

## 2020-09-10 NOTE — ED Notes (Signed)
Update given to Lonnie [name], pts daughter

## 2020-09-10 NOTE — ED Notes (Signed)
Patient transported to X-ray 

## 2020-09-10 NOTE — Progress Notes (Signed)
[facility]  PROGRESS NOTE  Lonnie [name] TMH:[id number] DOB: 06-17-1951 DOA: 09/09/2020 PCP: [name], MD Admit date - 09/09/2020   Admitting Physician Hillary Bow, DO  Outpatient Primary MD for the patient is [name], MD  LOS - 0 Brief Narrative   RIGDON MACOMBER is a 69 y.o. year old male with medical history significant for CAD s/p CABG, HTN, PAD, ICM, fronto-temporal dementia who presented on 09/09/2020 with increased falls after a recent fall a week ago leading to right patellar fracture and recently treated as an outpatient with Bactrim x10 days for UTI symptoms.  He was found on the floor his home by his wife and his own emesis alert but confused.  Patient found to have severe sepsis secondary to presumed UTI started on cefepime while monitoring urine cultures.    Subjective  Today denies any bladder pain or abdominal pain. States he has no pain  A & P   Severe sepsis secondary to UTI.  Presented with fever, tachypnea, tachycardia, leukocytosis and evidence of endorgan damage with lactic acidosis and AKI with UA concerning for UTI, failed outpatient therapy with Bactrim x10 days.  Given prior history of Pseudomonas in the past covering with cefepime.  No respiratory issues, chest x-ray unremarkable.  CT abdomen with no evidence of abscess or other localizing signs of infection -Continue  cefepime while awaiting urine culture -Also following blood cultures -Continue LR at rate of 150 cc/h -, repeat lactic acid until clearance  AKI, prerenal in the setting of infection.  Baseline creatinine 0.99, currently 1.31. -Continue IV fluids -Monitor BMP, avoid nephrotoxins, monitor output  Constipation.  CT scan concerning for fecal impaction -Disimpact manually none-resume home Colace -Monitor BM  R Patella fracture. Spoke with daughter patient fell a week ago Monday knee in immobilizer/brace per orthopedics [facility], plan to monitor and reassess with  imaging in about a week and provide supportive care -Avoid Norco, family states this is too strong for him -Continue as needed tramadol and acetaminophen  BPH, no current signs of obstruction.  Seeing adequate flow from Foley catheter placed -Continue home Flomax  Normocytic anemia, chronic, stable -Monitor CBC  Hyperlipidemia, stable -Resume home statin  Frontal temporal dementia with mood disorder.  Alert and oriented to self, place.  Following commands.  Has difficulty with more complex communication. -Continue home Celexa, Seroquel -Holding home Klonopin in setting of infection  GERD, stable -continue home PPI  Hypertension.  Currently at goal -Will hold home Lopressor and irbesartan in the setting of sepsis  Peripheral neuropathy, stable -Continue home Neurontin  CAD status post CABG.  Without any chest pain -Continue home aspirin and Plavix  Tobacco Abuse. Still smokes -nicotine patch   Family Communication  :  Updated daughter by phone  Code Status :  FULL CODE  Disposition Plan  :  Patient is from home. Anticipated d/c date: 2 to 3 days. Barriers to d/c or necessity for inpatient status: Needs IV antibiotic for severe sepsis and watching cultures, needs IVF for lactic acidosis Consults  :  none  Procedures  :  none  DVT Prophylaxis  :  Lovenox   MDM: The below labs and imaging reports were reviewed and summarized above.  Medication management as above.  Lab Results  Component Value Date   PLT 310 09/10/2020    Diet :  Diet Order            Diet Heart Room service appropriate? Yes; Fluid consistency:  Thin  Diet effective now                  Inpatient Medications Scheduled Meds: . enoxaparin (LOVENOX) injection  40 mg Subcutaneous Q24H  . nicotine  7 mg Transdermal Daily   Continuous Infusions: . ceFEPime (MAXIPIME) IV Stopped (09/10/20 0650)  . lactated ringers 150 mL/hr at 09/10/20 0827   PRN Meds:.acetaminophen **OR** acetaminophen,  ondansetron **OR** ondansetron (ZOFRAN) IV, traMADol  Antibiotics  :   Anti-infectives (From admission, onward)   Start     Dose/Rate Route Frequency Ordered Stop   09/10/20 0030  ceFEPIme (MAXIPIME) 2 g in sodium chloride 0.9 % 100 mL IVPB        2 g 200 mL/hr over 30 Minutes Intravenous Every 8 hours 09/10/20 0015     09/09/20 2330  cefTRIAXone (ROCEPHIN) 1 g in sodium chloride 0.9 % 100 mL IVPB  Status:  Discontinued        1 g 200 mL/hr over 30 Minutes Intravenous Every 24 hours 09/09/20 2325 09/10/20 0013       Objective   Vitals:   09/10/20 0042 09/10/20 0340 09/10/20 0545 09/10/20 0821  BP:  (!) 143/64 135/69 (!) 144/77  Pulse:  98 93 83  Resp:  16 16 14   Temp: (!) 100.6 F (38.1 C)  (!) 100.7 F (38.2 C) (!) 102.8 F (39.3 C)  TempSrc: Oral  Oral Oral  SpO2:  98% 98% 98%  Weight:        SpO2: 98 %  Wt Readings from Last 3 Encounters:  09/09/20 91.2 kg  07/27/20 95.3 kg  04/26/20 90.5 kg     Intake/Output Summary (Last 24 hours) at 09/10/2020 0841 Last data filed at 09/10/2020 09/12/2020 Gross per 24 hour  Intake --  Output 200 ml  Net -200 ml    Physical Exam:     Awake, has eyes closed throughout exam, oriented to self, place (not year 2018), difficulty being able to say the context. Follows commands (wiggles toes, left left leg, limited as right leg is in brace, gives thumbs up sign on command, opens eyes, no obvious focal deficit Able to converse in simple sentences but on further details words are nonsensical Lenora.AT, Normal respiratory effort on room air, CTAB RRR,No appreciable gallops,Rubs or new Murmurs,  +ve B.Sounds, Abd Soft, No tenderness, No rebound, guarding or rigidity. No Cyanosis, No new Rash or bruise Brace in place on right leg   I have personally reviewed the following:   Data Reviewed:  CBC Recent Labs  Lab 09/09/20 2108 09/10/20 0700  WBC 19.9* 19.4*  HGB 11.6* 11.0*  HCT 36.5* 35.9*  PLT 354 310  MCV 86.1 86.9  MCH  27.4 26.6  MCHC 31.8 30.6  RDW 14.5 14.5  LYMPHSABS 0.5*  --   MONOABS 0.9  --   EOSABS 0.0  --   BASOSABS 0.1  --     Chemistries  Recent Labs  Lab 09/09/20 2108 09/10/20 0700  NA 136 135  K 4.4 4.2  CL 102 102  CO2 23 21*  GLUCOSE 141* 104*  BUN 19 17  CREATININE 1.26* 1.31*  CALCIUM 9.6 9.3  AST 32 36  ALT 36 33  ALKPHOS 99 87  BILITOT 0.9 1.0   ------------------------------------------------------------------------------------------------------------------ No results for input(s): CHOL, HDL, LDLCALC, TRIG, CHOLHDL, LDLDIRECT in the last 72 hours.  Lab Results  Component Value Date   HGBA1C 5.8 04/26/2020   ------------------------------------------------------------------------------------------------------------------ No results for input(s): TSH, T4TOTAL,  T3FREE, THYROIDAB in the last 72 hours.  Invalid input(s): FREET3 ------------------------------------------------------------------------------------------------------------------ No results for input(s): VITAMINB12, FOLATE, FERRITIN, TIBC, IRON, RETICCTPCT in the last 72 hours.  Coagulation profile Recent Labs  Lab 09/10/20 0700  INR 1.1    No results for input(s): DDIMER in the last 72 hours.  Cardiac Enzymes No results for input(s): CKMB, TROPONINI, MYOGLOBIN in the last 168 hours.  Invalid input(s): CK ------------------------------------------------------------------------------------------------------------------ No results found for: BNP  Micro Results Recent Results (from the past 240 hour(s))  SARS Coronavirus 2 by RT PCR (hospital order, performed in [facility] hospital lab) Nasopharyngeal Nasopharyngeal Swab     Status: None   Collection Time: 09/09/20  1:03 AM   Specimen: Nasopharyngeal Swab  Result Value Ref Range Status   SARS Coronavirus 2 NEGATIVE NEGATIVE Final    Comment: (NOTE) SARS-CoV-2 target nucleic acids are NOT DETECTED.  The SARS-CoV-2 RNA is generally detectable  in upper and lower respiratory specimens during the acute phase of infection. The lowest concentration of SARS-CoV-2 viral copies this assay can detect is 250 copies / mL. A negative result does not preclude SARS-CoV-2 infection and should not be used as the sole basis for treatment or other patient management decisions.  A negative result may occur with improper specimen collection / handling, submission of specimen other than nasopharyngeal swab, presence of viral mutation(s) within the areas targeted by this assay, and inadequate number of viral copies (<250 copies / mL). A negative result must be combined with clinical observations, patient history, and epidemiological information.  Fact Sheet for Patients:   BoilerBrush.com.cy  Fact Sheet for Healthcare Providers: https://pope.com/  This test is not yet approved or  cleared by the [place] FDA and has been authorized for detection and/or diagnosis of SARS-CoV-2 by FDA under an Emergency Use Authorization (EUA).  This EUA will remain in effect (meaning this test can be used) for the duration of the COVID-19 declaration under Section 564(b)(1) of the Act, 21 U.S.C. section 360bbb-3(b)(1), unless the authorization is terminated or revoked sooner.  Performed at [facility], [address] [place], [place] [address]     Radiology Reports DG Chest 1 View  Result Date: 09/10/2020 CLINICAL DATA:  Found unresponsive EXAM: CHEST  1 VIEW COMPARISON:  03/01/2020 FINDINGS: Cardiac shadow is within normal limits. Postsurgical changes are again noted and stable. Lungs are well aerated without focal infiltrate or sizable effusion. No acute bony abnormality is noted. IMPRESSION: No active disease. Electronically Signed   By: Alcide Clever M.D.   On: 09/10/2020 00:31   CT Head Wo Contrast  Result Date: 09/09/2020 CLINICAL DATA:  Unresponsive EXAM: CT HEAD WITHOUT CONTRAST TECHNIQUE:  Contiguous axial images were obtained from the base of the skull through the vertex without intravenous contrast. COMPARISON:  03/02/2020 FINDINGS: Brain: Chronic atrophic and ischemic changes are identified similar to that seen on the prior exam. Prominent CSF space is noted diffusely consistent with the atrophic change. No findings to suggest acute hemorrhage, acute infarction or space-occupying mass lesion are noted. Previously seen left frontal subdural hematoma has resolved in the interval. Partially empty sella is noted as well. Vascular: No hyperdense vessel or unexpected calcification. Skull: Postsurgical changes in the left parietal region are seen. No other bony abnormality is noted. Sinuses/Orbits: Chronic mucosal changes within the ethmoid and maxillary antra stable from the prior exam. Other: None IMPRESSION: Chronic changes without acute abnormality. Electronically Signed   By: Alcide Clever M.D.   On: 09/09/2020 22:34  CT ABDOMEN PELVIS W CONTRAST  Result Date: 09/09/2020 CLINICAL DATA:  Nausea, vomiting, coffee-ground emesis EXAM: CT ABDOMEN AND PELVIS WITH CONTRAST TECHNIQUE: Multidetector CT imaging of the abdomen and pelvis was performed using the standard protocol following bolus administration of intravenous contrast. CONTRAST:  100mL OMNIPAQUE IOHEXOL 300 MG/ML  SOLN COMPARISON:  None. FINDINGS: Lower chest: The visualized lung bases are clear bilaterally. At least moderate right coronary artery calcification. Cardiac size within normal limits. No pericardial effusion. Median sternotomy has been performed. Small hiatal hernia present. Hepatobiliary: No focal liver abnormality is seen. No gallstones, gallbladder wall thickening, or biliary dilatation. Pancreas: Unremarkable Spleen: Unremarkable Adrenals/Urinary Tract: Adrenal glands are unremarkable. Kidneys are normal, without renal calculi, focal lesion, or hydronephrosis. Bladder is unremarkable. Stomach/Bowel: The stomach, and small  bowel are unremarkable. Appendix absent. The sigmoid colon is redundant. Moderate distal descending and proximal sigmoid diverticulosis without superimposed inflammatory change. No obstruction. Large stool within the rectal vault raising the question of fecal impaction. No perirectal inflammatory change identified. No free intraperitoneal gas or fluid. Tiny fat containing umbilical hernia. Vascular/Lymphatic: Extensive aortoiliac atherosclerotic calcification without evidence of aneurysm. Extensive atherosclerotic calcification is seen within the common iliac arteries bilaterally and left external iliac artery, however, the degree of stenosis is not well assessed on this examination. Right femoral bypass graft is partially visualized with occlusion of the superficial femoral artery proximally. Extensive atherosclerotic calcification noted within the visualized left lower extremity arterial outflow. No pathologic adenopathy within the abdomen and pelvis. Reproductive: Prostate is unremarkable. Other: None significant Musculoskeletal: No lytic or blastic bone lesions are seen. Degenerative changes are seen within the lumbar spine. IMPRESSION: No definite radiographic explanation for the patient's symptoms. Moderate stool within the rectal vault raising the question of fecal impaction. Aortic Atherosclerosis (ICD10-I70.0). Electronically Signed   By: Helyn NumbersAshesh  Parikh MD   On: 09/09/2020 22:39     Time Spent in minutes  30     Laverna PeaceShayla D Garnett Nunziata M.D on 09/10/2020 at 8:41 AM  To page go to [email or web address] - password [email or web address]

## 2020-09-10 NOTE — ED Notes (Signed)
Pt transferred to hospital bed for comfort.

## 2020-09-10 NOTE — Progress Notes (Addendum)
Repeat lactate has increased to 3.9 despite IVF bolus of 1 out of the 2L (2nd going now):  1) increase LR rate to 150 2) upgrading bed request to SDU 3) will go see if I can at least get this guy out of the hallway and into a room with monitor. 4) trend lactate

## 2020-09-10 NOTE — Progress Notes (Signed)
Pharmacy Antibiotic Note  Lonnie [name] is a 69 y.o. male admitted on 09/09/2020 with UTI.  Pharmacy has been consulted for cefepime dosing.  Plan: Cefepime 2gm IV q8 hours F/u renal function, cultures and clinical course  Weight: 91.2 kg (201 lb)  Temp (24hrs), Avg:99.6 F (37.6 C), Min:98.9 F (37.2 C), Max:100.3 F (37.9 C)  Recent Labs  Lab 09/09/20 2108 09/09/20 2109  WBC 19.9*  --   CREATININE 1.26*  --   LATICACIDVEN  --  3.8*    Estimated Creatinine Clearance: 66.1 mL/min (A) (by C-G formula based on SCr of 1.26 mg/dL (H)).    Allergies  Allergen Reactions  . Ace Inhibitors Swelling    Angioedema  . Dairy Aid [Lactase]     gas  . Eggs Or Egg-Derived Products     Cannot eat Prepared Eggs  . Latex Itching    Thank you for allowing pharmacy to be a part of this patient's care.  Talbert Cage Poteet 09/10/2020 12:16 AM

## 2020-09-11 LAB — CBC
HCT: 30.9 % — ABNORMAL LOW (ref 39.0–52.0)
Hemoglobin: 9.7 g/dL — ABNORMAL LOW (ref 13.0–17.0)
MCH: 26.6 pg (ref 26.0–34.0)
MCHC: 31.4 g/dL (ref 30.0–36.0)
MCV: 84.9 fL (ref 80.0–100.0)
Platelets: 239 10*3/uL (ref 150–400)
RBC: 3.64 MIL/uL — ABNORMAL LOW (ref 4.22–5.81)
RDW: 14.5 % (ref 11.5–15.5)
WBC: 12.6 10*3/uL — ABNORMAL HIGH (ref 4.0–10.5)
nRBC: 0 % (ref 0.0–0.2)

## 2020-09-11 LAB — BASIC METABOLIC PANEL
Anion gap: 10 (ref 5–15)
BUN: 17 mg/dL (ref 8–23)
CO2: 21 mmol/L — ABNORMAL LOW (ref 22–32)
Calcium: 8.7 mg/dL — ABNORMAL LOW (ref 8.9–10.3)
Chloride: 106 mmol/L (ref 98–111)
Creatinine, Ser: 1.08 mg/dL (ref 0.61–1.24)
GFR calc Af Amer: >60 mL/min (ref >60)
GFR calc non Af Amer: >60 mL/min (ref >60)
Glucose, Bld: 120 mg/dL — ABNORMAL HIGH (ref 70–99)
Potassium: 4.1 mmol/L (ref 3.5–5.1)
Sodium: 137 mmol/L (ref 135–145)

## 2020-09-11 LAB — URINE CULTURE: Culture: >=100000 — AB

## 2020-09-11 LAB — LACTIC ACID, PLASMA: Lactic Acid, Venous: 2.2 mmol/L (ref 0.5–1.9)

## 2020-09-11 MED ORDER — SODIUM CHLORIDE 0.9 % IV SOLN
INTRAVENOUS | Status: DC | PRN
  Administered 2020-09-11: 250 mL via INTRAVENOUS

## 2020-09-11 MED ORDER — CEPHALEXIN 250 MG PO CAPS
250.0000 mg | ORAL_CAPSULE | Freq: Three times a day (TID) | ORAL | Status: DC
  Administered 2020-09-11 – 2020-09-12 (×3): 250 mg via ORAL
  Filled 2020-09-11 (×6): qty 1

## 2020-09-11 NOTE — Progress Notes (Signed)
[facility]  PROGRESS NOTE  Deaunte Dente Jamerson WUJ:[id number] DOB: 1951-11-15 DOA: 09/09/2020 PCP: [name], MD Admit date - 09/09/2020   Admitting Physician Hillary Bow, DO  Outpatient Primary MD for the patient is [name], MD  LOS - 1 Brief Narrative   Lonnie [name] is a 69 y.o. year old male with medical history significant for CAD s/p CABG, HTN, PAD, ICM, fronto-temporal dementia who presented on 09/09/2020 with increased falls after a recent fall a week ago leading to right patellar fracture and recently treated as an outpatient with Bactrim x10 days for UTI symptoms.  He was found on the floor his home by his wife and his own emesis alert but confused.  Patient found to have severe sepsis secondary to presumed UTI started on cefepime while monitoring urine cultures.    Subjective  Feels better today. Upset because he wants to go home. Ok with me talking to his daughter A & P   Severe sepsis secondary to E.coli UTI.  Presented with fever, tachypnea, tachycardia, leukocytosis and evidence of endorgan damage with lactic acidosis and AKI with UA concerning for UTI, failed outpatient therapy with Bactrim x10 days.  Given prior history of Pseudomonas in the past started on cefepime.  Urine culture shows E.coli resistant to bactrim but sensitive to keflex, remains afebrile with resolution of sepsis physiology and no growth on blood cultures. -D/c cefepime  --Start keflex, monitor to ensure no fever, tolerates oral regimen -Also following blood cultures -D/c LR not that lactic acidosis cleared  AKI, prerenal in the setting of infection.  Baseline creatinine 0.99, currently 1 and back at baseline -D/c fluids, encourage oral intake -Monitor BMP, avoid nephrotoxins, monitor output  Constipation.  CT scan concerning for fecal impaction -Disimpact manually  -resume home Colace -Monitor BM  R Patella fracture. Spoke with daughter patient fell a week ago Monday knee in  immobilizer/brace per orthopedics [facility], plan to monitor and reassess with imaging in about a week and provide supportive care -Avoid Norco, family states this is too strong for him -Continue as needed tramadol and acetaminophen  BPH, no current signs of obstruction.  Seeing adequate flow from Foley catheter placed -Continue home Flomax  Normocytic anemia, chronic, stable -Monitor CBC  Hyperlipidemia, stable -Resume home statin  Frontal temporal dementia with mood disorder.  Alert and oriented to self, place.  Following commands.  Has difficulty with more complex communication. -Continue home Celexa, Seroquel -Holding home Klonopin in setting of infection  GERD, stable -continue home PPI  Hypertension.  Currently at goal -Held home Lopressor and irbesartan in the setting of sepsis  Peripheral neuropathy, stable -Continue home Neurontin  CAD status post CABG.  Without any chest pain -Continue home aspirin and Plavix  Tobacco Abuse. Still smokes -nicotine patch   Family Communication  :  Updated daughter by phone on 9/20, will call today  Code Status :  FULL CODE  Disposition Plan  :  Patient is from home. Anticipated d/c date: 1 day. Barriers to d/c or necessity for inpatient status: monitor fever/clinical status while transitioning to oral antibiotics, if stable likely discharge on 09/12/20 Consults  :  none  Procedures  :  none  DVT Prophylaxis  :  Lovenox   MDM: The below labs and imaging reports were reviewed and summarized above.  Medication management as above.  Lab Results  Component Value Date   PLT 239 09/11/2020    Diet :  Diet Order  Diet Heart Room service appropriate? Yes with Assist; Fluid consistency: Thin  Diet effective now                  Inpatient Medications Scheduled Meds: . citalopram  10 mg Oral Daily  . clopidogrel  75 mg Oral Daily  . docusate sodium  100 mg Oral Daily  . enoxaparin (LOVENOX) injection   40 mg Subcutaneous Q24H  . gabapentin  100 mg Oral BID  . melatonin  6 mg Oral QHS  . nicotine  7 mg Transdermal Daily  . pantoprazole  40 mg Oral QHS  . QUEtiapine  200 mg Oral BID  . tamsulosin  0.8 mg Oral Q supper   Continuous Infusions: . sodium chloride 250 mL (09/11/20 0643)  . ceFEPime (MAXIPIME) IV 2 g (09/11/20 1315)   PRN Meds:.sodium chloride, acetaminophen **OR** acetaminophen, ondansetron **OR** ondansetron (ZOFRAN) IV, traMADol  Antibiotics  :   Anti-infectives (From admission, onward)   Start     Dose/Rate Route Frequency Ordered Stop   09/10/20 0030  ceFEPIme (MAXIPIME) 2 g in sodium chloride 0.9 % 100 mL IVPB        2 g 200 mL/hr over 30 Minutes Intravenous Every 8 hours 09/10/20 0015     09/09/20 2330  cefTRIAXone (ROCEPHIN) 1 g in sodium chloride 0.9 % 100 mL IVPB  Status:  Discontinued        1 g 200 mL/hr over 30 Minutes Intravenous Every 24 hours 09/09/20 2325 09/10/20 0013       Objective   Vitals:   09/11/20 0800 09/11/20 1000 09/11/20 1115 09/11/20 1525  BP: 125/69 (!) 117/51 117/62 (!) 113/53  Pulse: 62 61 64 60  Resp: 16 16 15 14   Temp:   99.1 F (37.3 C) 98.5 F (36.9 C)  TempSrc:   Oral Oral  SpO2: 100% 100% 98% 98%  Weight:        SpO2: 98 %  Wt Readings from Last 3 Encounters:  09/09/20 91.2 kg  07/27/20 95.3 kg  04/26/20 90.5 kg     Intake/Output Summary (Last 24 hours) at 09/11/2020 1811 Last data filed at 09/11/2020 1611 Gross per 24 hour  Intake 290 ml  Output 2700 ml  Net -2410 ml    Physical Exam:     Awake,alert and oriented to self, place, time, context, much improved with mentation  Follows commands (wiggles toes, left left leg, limited as right leg is in brace, gives thumbs up sign on command, opens eyes, no obvious focal deficit [place].AT, Normal respiratory effort on room air, CTAB RRR,No appreciable gallops,Rubs or new Murmurs,  +ve B.Sounds, Abd Soft, No tenderness, No rebound, guarding or rigidity. No  Cyanosis, No new Rash or bruise Brace in place on right leg   I have personally reviewed the following:   Data Reviewed:  CBC Recent Labs  Lab 09/09/20 2108 09/10/20 0700 09/11/20 1231  WBC 19.9* 19.4* 12.6*  HGB 11.6* 11.0* 9.7*  HCT 36.5* 35.9* 30.9*  PLT 354 310 239  MCV 86.1 86.9 84.9  MCH 27.4 26.6 26.6  MCHC 31.8 30.6 31.4  RDW 14.5 14.5 14.5  LYMPHSABS 0.5*  --   --   MONOABS 0.9  --   --   EOSABS 0.0  --   --   BASOSABS 0.1  --   --     Chemistries  Recent Labs  Lab 09/09/20 2108 09/10/20 0700 09/11/20 1231  NA 136 135 137  K 4.4 4.2 4.1  CL 102 102 106  CO2 23 21* 21*  GLUCOSE 141* 104* 120*  BUN 19 17 17   CREATININE 1.26* 1.31* 1.08  CALCIUM 9.6 9.3 8.7*  AST 32 36  --   ALT 36 33  --   ALKPHOS 99 87  --   BILITOT 0.9 1.0  --    ------------------------------------------------------------------------------------------------------------------ No results for input(s): CHOL, HDL, LDLCALC, TRIG, CHOLHDL, LDLDIRECT in the last 72 hours.  Lab Results  Component Value Date   HGBA1C 5.8 04/26/2020   ------------------------------------------------------------------------------------------------------------------ No results for input(s): TSH, T4TOTAL, T3FREE, THYROIDAB in the last 72 hours.  Invalid input(s): FREET3 ------------------------------------------------------------------------------------------------------------------ No results for input(s): VITAMINB12, FOLATE, FERRITIN, TIBC, IRON, RETICCTPCT in the last 72 hours.  Coagulation profile Recent Labs  Lab 09/10/20 0700  INR 1.1    No results for input(s): DDIMER in the last 72 hours.  Cardiac Enzymes No results for input(s): CKMB, TROPONINI, MYOGLOBIN in the last 168 hours.  Invalid input(s): CK ------------------------------------------------------------------------------------------------------------------ No results found for: BNP  Micro Results Recent Results (from the past  240 hour(s))  SARS Coronavirus 2 by RT PCR (hospital order, performed in [facility] hospital lab) Nasopharyngeal Nasopharyngeal Swab     Status: None   Collection Time: 09/09/20  1:03 AM   Specimen: Nasopharyngeal Swab  Result Value Ref Range Status   SARS Coronavirus 2 NEGATIVE NEGATIVE Final    Comment: (NOTE) SARS-CoV-2 target nucleic acids are NOT DETECTED.  The SARS-CoV-2 RNA is generally detectable in upper and lower respiratory specimens during the acute phase of infection. The lowest concentration of SARS-CoV-2 viral copies this assay can detect is 250 copies / mL. A negative result does not preclude SARS-CoV-2 infection and should not be used as the sole basis for treatment or other patient management decisions.  A negative result may occur with improper specimen collection / handling, submission of specimen other than nasopharyngeal swab, presence of viral mutation(s) within the areas targeted by this assay, and inadequate number of viral copies (<250 copies / mL). A negative result must be combined with clinical observations, patient history, and epidemiological information.  Fact Sheet for Patients:   BoilerBrush.com.cyhttps://www.fda.gov/media/136312/download  Fact Sheet for Healthcare Providers: https://pope.com/https://www.fda.gov/media/136313/download  This test is not yet approved or  cleared by the [place] FDA and has been authorized for detection and/or diagnosis of SARS-CoV-2 by FDA under an Emergency Use Authorization (EUA).  This EUA will remain in effect (meaning this test can be used) for the duration of the COVID-19 declaration under Section 564(b)(1) of the Act, 21 U.S.C. section 360bbb-3(b)(1), unless the authorization is terminated or revoked sooner.  Performed at [facility], [address] [place], [place] [address]   Blood culture (routine x 2)     Status: None (Preliminary result)   Collection Time: 09/09/20  3:25 AM   Specimen: BLOOD  Result Value Ref Range  Status   Specimen Description BLOOD SITE NOT SPECIFIED  Final   Special Requests   Final    BOTTLES DRAWN AEROBIC AND ANAEROBIC Blood Culture adequate volume   Culture   Final    NO GROWTH 1 DAY Performed at [facility], [address] [place], [place] [address]    Report Status PENDING  Incomplete  Urine culture     Status: Abnormal   Collection Time: 09/09/20 10:46 PM   Specimen: Urine, Random  Result Value Ref Range Status   Specimen Description URINE, RANDOM  Final   Special Requests   Final  NONE Performed at [facility], [address], [place], [place] [address]    Culture >=100,000 COLONIES/mL ESCHERICHIA COLI (A)  Final   Report Status 09/11/2020 FINAL  Final   Organism ID, Bacteria ESCHERICHIA COLI (A)  Final      Susceptibility   Escherichia coli - MIC*    AMPICILLIN >=32 RESISTANT Resistant     CEFAZOLIN 16 SENSITIVE Sensitive     CEFTRIAXONE <=0.25 SENSITIVE Sensitive     CIPROFLOXACIN <=0.25 SENSITIVE Sensitive     GENTAMICIN <=1 SENSITIVE Sensitive     IMIPENEM <=0.25 SENSITIVE Sensitive     NITROFURANTOIN <=16 SENSITIVE Sensitive     TRIMETH/SULFA >=320 RESISTANT Resistant     AMPICILLIN/SULBACTAM >=32 RESISTANT Resistant     PIP/TAZO <=4 SENSITIVE Sensitive     * >=100,000 COLONIES/mL ESCHERICHIA COLI  Blood culture (routine x 2)     Status: None (Preliminary result)   Collection Time: 09/10/20 12:00 AM   Specimen: BLOOD  Result Value Ref Range Status   Specimen Description BLOOD SITE NOT SPECIFIED  Final   Special Requests   Final    BOTTLES DRAWN AEROBIC AND ANAEROBIC Blood Culture results may not be optimal due to an inadequate volume of blood received in culture bottles   Culture   Final    NO GROWTH 1 DAY Performed at [facility], [address] [place], [place] [address]    Report Status PENDING  Incomplete  Culture, blood (single)     Status: None (Preliminary result)   Collection Time: 09/10/20  9:22 AM   Specimen:  BLOOD  Result Value Ref Range Status   Specimen Description BLOOD SITE NOT SPECIFIED  Final   Special Requests   Final    BOTTLES DRAWN AEROBIC AND ANAEROBIC Blood Culture results may not be optimal due to an excessive volume of blood received in culture bottles   Culture   Final    NO GROWTH 1 DAY Performed at [facility], [address] [place], [place] [address]    Report Status PENDING  Incomplete    Radiology Reports DG Chest 1 View  Result Date: 09/10/2020 CLINICAL DATA:  Found unresponsive EXAM: CHEST  1 VIEW COMPARISON:  03/01/2020 FINDINGS: Cardiac shadow is within normal limits. Postsurgical changes are again noted and stable. Lungs are well aerated without focal infiltrate or sizable effusion. No acute bony abnormality is noted. IMPRESSION: No active disease. Electronically Signed   By: Alcide Clever M.D.   On: 09/10/2020 00:31   CT Head Wo Contrast  Result Date: 09/09/2020 CLINICAL DATA:  Unresponsive EXAM: CT HEAD WITHOUT CONTRAST TECHNIQUE: Contiguous axial images were obtained from the base of the skull through the vertex without intravenous contrast. COMPARISON:  03/02/2020 FINDINGS: Brain: Chronic atrophic and ischemic changes are identified similar to that seen on the prior exam. Prominent CSF space is noted diffusely consistent with the atrophic change. No findings to suggest acute hemorrhage, acute infarction or space-occupying mass lesion are noted. Previously seen left frontal subdural hematoma has resolved in the interval. Partially empty sella is noted as well. Vascular: No hyperdense vessel or unexpected calcification. Skull: Postsurgical changes in the left parietal region are seen. No other bony abnormality is noted. Sinuses/Orbits: Chronic mucosal changes within the ethmoid and maxillary antra stable from the prior exam. Other: None IMPRESSION: Chronic changes without acute abnormality. Electronically Signed   By: Alcide Clever M.D.   On: 09/09/2020 22:34   CT  ABDOMEN PELVIS W  CONTRAST  Result Date: 09/09/2020 CLINICAL DATA:  Nausea, vomiting, coffee-ground emesis EXAM: CT ABDOMEN AND PELVIS WITH CONTRAST TECHNIQUE: Multidetector CT imaging of the abdomen and pelvis was performed using the standard protocol following bolus administration of intravenous contrast. CONTRAST:  OMNIPAQUE IOHEXOL 300 MG/ML  SOLN COMPARISON:  None. FINDINGS: Lower chest: The visualized lung bases are clear bilaterally. At least moderate right coronary artery calcification. Cardiac size within normal limits. No pericardial effusion. Median sternotomy has been performed. Small hiatal hernia present. Hepatobiliary: No focal liver abnormality is seen. No gallstones, gallbladder wall thickening, or biliary dilatation. Pancreas: Unremarkable Spleen: Unremarkable Adrenals/Urinary Tract: Adrenal glands are unremarkable. Kidneys are normal, without renal calculi, focal lesion, or hydronephrosis. Bladder is unremarkable. Stomach/Bowel: The stomach, and small bowel are unremarkable. Appendix absent. The sigmoid colon is redundant. Moderate distal descending and proximal sigmoid diverticulosis without superimposed inflammatory change. No obstruction. Large stool within the rectal vault raising the question of fecal impaction. No perirectal inflammatory change identified. No free intraperitoneal gas or fluid. Tiny fat containing umbilical hernia. Vascular/Lymphatic: Extensive aortoiliac atherosclerotic calcification without evidence of aneurysm. Extensive atherosclerotic calcification is seen within the common iliac arteries bilaterally and left external iliac artery, however, the degree of stenosis is not well assessed on this examination. Right femoral bypass graft is partially visualized with occlusion of the superficial femoral artery proximally. Extensive atherosclerotic calcification noted within the visualized left lower extremity arterial outflow. No pathologic adenopathy within the abdomen  and pelvis. Reproductive: Prostate is unremarkable. Other: None significant Musculoskeletal: No lytic or blastic bone lesions are seen. Degenerative changes are seen within the lumbar spine. IMPRESSION: No definite radiographic explanation for the patient's symptoms. Moderate stool within the rectal vault raising the question of fecal impaction. Aortic Atherosclerosis (ICD10-I70.0). Electronically Signed   By: Helyn Numbers MD   On: 09/09/2020 22:39     Time Spent in minutes  30     Laverna Peace M.D on 09/11/2020 at 6:11 PM  To page go to [email or web address] - password [facility]

## 2020-09-11 NOTE — Plan of Care (Signed)
  Problem: Education: Goal: Knowledge of General Education information will improve Description: Including pain rating scale, medication(s)/side effects and non-pharmacologic comfort measures Outcome: Progressing   Problem: Health Behavior/Discharge Planning: Goal: Ability to manage health-related needs will improve Outcome: Progressing   Problem: Clinical Measurements: Goal: Ability to maintain clinical measurements within normal limits will improve Outcome: Progressing Goal: Will remain free from infection Outcome: Progressing Goal: Diagnostic test results will improve Outcome: Progressing Goal: Respiratory complications will improve Outcome: Progressing Goal: Cardiovascular complication will be avoided Outcome: Progressing   Problem: Activity: Goal: Risk for activity intolerance will decrease Outcome: Progressing   Problem: Nutrition: Goal: Adequate nutrition will be maintained Outcome: Progressing   Problem: Coping: Goal: Level of anxiety will decrease Outcome: Progressing   Problem: Elimination: Goal: Will not experience complications related to bowel motility Outcome: Progressing Goal: Will not experience complications related to urinary retention Outcome: Progressing   Problem: Pain Managment: Goal: General experience of comfort will improve Outcome: Progressing   Problem: Safety: Goal: Ability to remain free from injury will improve Outcome: Progressing   Problem: Skin Integrity: Goal: Risk for impaired skin integrity will decrease Outcome: Progressing   Problem: Fluid Volume: Goal: Hemodynamic stability will improve Outcome: Progressing   Problem: Clinical Measurements: Goal: Diagnostic test results will improve Outcome: Progressing Goal: Signs and symptoms of infection will decrease Outcome: Progressing   Problem: Respiratory: Goal: Ability to maintain adequate ventilation will improve Outcome: Progressing   Problem: Urinary  Elimination: Goal: Signs and symptoms of infection will decrease Outcome: Progressing   

## 2020-09-12 ENCOUNTER — Encounter (HOSPITAL_COMMUNITY): Payer: Self-pay | Admitting: Internal Medicine

## 2020-09-12 DIAGNOSIS — B9620 Unspecified Escherichia coli [E. coli] as the cause of diseases classified elsewhere: Secondary | ICD-10-CM

## 2020-09-12 LAB — BASIC METABOLIC PANEL
Anion gap: 11 (ref 5–15)
BUN: 14 mg/dL (ref 8–23)
CO2: 22 mmol/L (ref 22–32)
Calcium: 8.7 mg/dL — ABNORMAL LOW (ref 8.9–10.3)
Chloride: 107 mmol/L (ref 98–111)
Creatinine, Ser: 1.04 mg/dL (ref 0.61–1.24)
GFR calc Af Amer: >60 mL/min (ref >60)
GFR calc non Af Amer: >60 mL/min (ref >60)
Glucose, Bld: 105 mg/dL — ABNORMAL HIGH (ref 70–99)
Potassium: 3.7 mmol/L (ref 3.5–5.1)
Sodium: 140 mmol/L (ref 135–145)

## 2020-09-12 LAB — CBC WITH DIFFERENTIAL/PLATELET
Abs Immature Granulocytes: 0.07 10*3/uL (ref 0.00–0.07)
Basophils Absolute: 0.1 10*3/uL (ref 0.0–0.1)
Basophils Relative: 1 %
Eosinophils Absolute: 0.5 10*3/uL (ref 0.0–0.5)
Eosinophils Relative: 6 %
HCT: 30.3 % — ABNORMAL LOW (ref 39.0–52.0)
Hemoglobin: 9.5 g/dL — ABNORMAL LOW (ref 13.0–17.0)
Immature Granulocytes: 1 %
Lymphocytes Relative: 21 %
Lymphs Abs: 2 10*3/uL (ref 0.7–4.0)
MCH: 26.5 pg (ref 26.0–34.0)
MCHC: 31.4 g/dL (ref 30.0–36.0)
MCV: 84.4 fL (ref 80.0–100.0)
Monocytes Absolute: 1.1 10*3/uL — ABNORMAL HIGH (ref 0.1–1.0)
Monocytes Relative: 12 %
Neutro Abs: 5.7 10*3/uL (ref 1.7–7.7)
Neutrophils Relative %: 59 %
Platelets: 243 10*3/uL (ref 150–400)
RBC: 3.59 MIL/uL — ABNORMAL LOW (ref 4.22–5.81)
RDW: 14.2 % (ref 11.5–15.5)
WBC: 9.5 10*3/uL (ref 4.0–10.5)
nRBC: 0 % (ref 0.0–0.2)

## 2020-09-12 MED ORDER — CEPHALEXIN 250 MG PO CAPS: 250.0000 mg | ORAL_CAPSULE | Freq: Three times a day (TID) | ORAL | 0 refills | Status: AC

## 2020-09-12 MED ORDER — NICOTINE 7 MG/24HR TD PT24: 7.0000 mg | MEDICATED_PATCH | Freq: Every day | TRANSDERMAL | 0 refills | Status: DC

## 2020-09-12 NOTE — TOC Transition Note (Signed)
Transition of Care (TOC) - CM/SW Discharge Note Donn Pierini RN, BSN Transitions of Care Unit 4E- RN Case Manager See Treatment Team for direct phone #    Patient Details  Name: Lonnie [name] MRN: [id number] Date of Birth: 06-Dec-1951  Transition [facility]) CM/SW Contact:  Darrold Span, RN Phone Number: 09/12/2020, 11:41 AM   Clinical Narrative:    Pt stable for transition home today, pt reports he is active with [facility] services- call made to Lupita Leash with [facility] to confirm services- pt is active with HHPT. Order placed for resumption of HHPT services- No further TOC needs noted.   Final next level of care: Home w Home Health Services Barriers to Discharge: No Barriers Identified   Patient Goals and CMS Choice Patient states their goals for this hospitalization and ongoing recovery are:: return home      Discharge Placement               Home with [facility]        Discharge Plan and Services   Discharge Planning Services: CM Consult Post Acute Care Choice: Resumption of Svcs/PTA Provider, Home Health                    HH Arranged: PT [facility] Agency: [facility] Date HH Agency Contacted: 09/12/20 Time HH Agency Contacted: 1000 Representative spoke with at [facility] Agency: Lupita Leash  Social Determinants of Health (SDOH) Interventions     Readmission Risk Interventions Readmission Risk Prevention Plan 09/12/2020 03/12/2020 03/05/2020  Transportation Screening Complete - Complete  PCP or Specialist Appt within 3-5 Days Complete - Complete  HRI or Home Care Consult Complete - Complete  Social Work Consult for Recovery Care Planning/Counseling Complete - Complete  Palliative Care Screening Not Applicable - Not Applicable  Medication Review Oceanographer) Complete Complete -  Some recent data might be hidden

## 2020-09-12 NOTE — Discharge Summary (Signed)
Physician Discharge Summary  Lonnie [name] OIB:[id number] DOB: [birth year] DOA: 09/09/2020  PCP: [name], MD  Admit date: 09/09/2020 Discharge date: 09/12/2020  Admitted From: Home  Discharge disposition: Home with Home PT   Recommendations for Outpatient Follow-Up:   . Follow up with your primary care provider in one week.  . Check CBC, BMP, magnesium in the next visit . Continue follow-up with your orthopedic surgeon as scheduled  Discharge Diagnosis:   Principal Problem:   Sepsis secondary to UTI [facility]) Active Problems:   HTN (hypertension)   Coronary atherosclerosis of native coronary artery   E-coli UTI   Fronto-temporal dementia (HCC)   PAD (peripheral artery disease) (HCC)   Acute metabolic encephalopathy   Closed fracture of right patella   Discharge Condition: Improved.  Diet recommendation: Low sodium, heart healthy.   Wound care: None.  Code status: Full.  History of Present Illness:  Lonnie [name] is a 69 y.o. year old male with medical history significant for CAD s/p CABG, HTN, PAD, ICM, fronto-temporal dementia who presented on 09/09/2020 with increased falls after a recent fall a week ago leading to right patellar fracture and recently treated as an outpatient with Bactrim x10 days for UTI symptoms.  He was found on the floor his home by his wife and his own emesis alert but confused. Patient found to have severe sepsis secondary to presumed UTI started on cefepime while monitoring urine cultures.  Hospital Course:   Following conditions were addressed during hospitalization as listed below,  Severe sepsis secondary to E.coli UTI.    Patient had failed outpatient treatment with Bactrim.  Patient did have a history of Pseudomonas UTI in the past and was initially started on cefepime.  Urine culture showed E. coli which was resistant to Bactrim but sensitive to Keflex.  Keflex was then started.  Sepsis physiology has resolved at this time.   Patient will be on Keflex on discharge to complete the course.  AKI, prerenal in the setting of infection.  Baseline creatinine 0.99.  Improved.  Creatinine prior to discharge was 1.05.  Constipation.  CT scan concerning for fecal impaction.  Continue colace on home.  Patient has had  bowel movements.  Right Patella fracture.  Patient has been seen by orthopedics as outpatient and is currently on  Immobilizer/brace. plan to monitor and reassess with imaging in about a week    BPH,.   Flomax  Normocytic anemia, chronic, stable CBC as outpatient.  Hyperlipidemia, continue statin.   Frontal temporal dementia with mood disorder.   On Celexa, Seroquel  GERD, stable On PPI  Essential hypertension.  Currently at goal. on Lopressor and irbesartan  Peripheral neuropathy, stable. Continue Neurontin  CAD status post CABG.  Without any chest pain. Continue home aspirin and Plavix  Tobacco Abuse. Still smokes. Consider nicotine patch on discharge.  Prescription has been sent.  Disposition.  At this time, patient is stable for disposition home. Spoke with the patient's daughter on the phone about discharge plan and follow up.    Medical Consultants:    None.  Procedures:    None Subjective:   Today, patient feels better denies any fever, chills or rigor. Wants to go home.  Discharge Exam:   Vitals:   09/12/20 0347 09/12/20 0735  BP: (!) 112/54 139/78  Pulse: 60 70  Resp: 15 16  Temp: 98.5 F (36.9 C) 98.4 F (36.9 C)  SpO2: 98% 95%   Vitals:   09/11/20 1954 09/11/20  2308 09/12/20 0347 09/12/20 0735  BP: 120/65 (!) 145/78 (!) 112/54 139/78  Pulse: 67 63 60 70  Resp: Temp: 98.7 F (37.1 C) 98.8 F (37.1 C) 98.5 F (36.9 C) 98.4 F (36.9 C)  TempSrc: Oral Oral Oral Oral  SpO2: 99% 100% 98% 95%  Weight:       General: Alert awake, not in obvious distress HENT: pupils equally reacting to light,  No scleral pallor or icterus noted. Oral mucosa  is moist.  Chest:  Clear breath sounds.  Diminished breath sounds bilaterally. No crackles or wheezes.  CVS: S1 &S2 heard. No murmur.  Regular rate and rhythm. Abdomen: Soft, nontender, nondistended.  Bowel sounds are heard.   Extremities: No cyanosis, clubbing or edema.  Peripheral pulses are palpable. Right brace knee Psych: Alert, awake and oriented to self place and time, normal mood CNS:  No cranial nerve deficits.  Power equal in all extremities. Moves extremities slowly Skin: Warm and dry.  No rashes noted.  The results of significant diagnostics from this hospitalization (including imaging, microbiology, ancillary and laboratory) are listed below for reference.     Diagnostic Studies:   DG Chest 1 View  Result Date: 09/10/2020 CLINICAL DATA:  Found unresponsive EXAM: CHEST  1 VIEW COMPARISON:  03/01/2020 FINDINGS: Cardiac shadow is within normal limits. Postsurgical changes are again noted and stable. Lungs are well aerated without focal infiltrate or sizable effusion. No acute bony abnormality is noted. IMPRESSION: No active disease. Electronically Signed   By: Alcide Clever M.D.   On: 09/10/2020 00:31   CT Head Wo Contrast  Result Date: 09/09/2020 CLINICAL DATA:  Unresponsive EXAM: CT HEAD WITHOUT CONTRAST TECHNIQUE: Contiguous axial images were obtained from the base of the skull through the vertex without intravenous contrast. COMPARISON:  03/02/2020 FINDINGS: Brain: Chronic atrophic and ischemic changes are identified similar to that seen on the prior exam. Prominent CSF space is noted diffusely consistent with the atrophic change. No findings to suggest acute hemorrhage, acute infarction or space-occupying mass lesion are noted. Previously seen left frontal subdural hematoma has resolved in the interval. Partially empty sella is noted as well. Vascular: No hyperdense vessel or unexpected calcification. Skull: Postsurgical changes in the left parietal region are seen. No other bony  abnormality is noted. Sinuses/Orbits: Chronic mucosal changes within the ethmoid and maxillary antra stable from the prior exam. Other: None IMPRESSION: Chronic changes without acute abnormality. Electronically Signed   By: Alcide Clever M.D.   On: 09/09/2020 22:34   CT ABDOMEN PELVIS W CONTRAST  Result Date: 09/09/2020 CLINICAL DATA:  Nausea, vomiting, coffee-ground emesis EXAM: CT ABDOMEN AND PELVIS WITH CONTRAST TECHNIQUE: Multidetector CT imaging of the abdomen and pelvis was performed using the standard protocol following bolus administration of intravenous contrast. CONTRAST:  OMNIPAQUE IOHEXOL 300 MG/ML  SOLN COMPARISON:  None. FINDINGS: Lower chest: The visualized lung bases are clear bilaterally. At least moderate right coronary artery calcification. Cardiac size within normal limits. No pericardial effusion. Median sternotomy has been performed. Small hiatal hernia present. Hepatobiliary: No focal liver abnormality is seen. No gallstones, gallbladder wall thickening, or biliary dilatation. Pancreas: Unremarkable Spleen: Unremarkable Adrenals/Urinary Tract: Adrenal glands are unremarkable. Kidneys are normal, without renal calculi, focal lesion, or hydronephrosis. Bladder is unremarkable. Stomach/Bowel: The stomach, and small bowel are unremarkable. Appendix absent. The sigmoid colon is redundant. Moderate distal descending and proximal sigmoid diverticulosis without superimposed inflammatory change. No obstruction. Large stool within the rectal vault raising the question  of fecal impaction. No perirectal inflammatory change identified. No free intraperitoneal gas or fluid. Tiny fat containing umbilical hernia. Vascular/Lymphatic: Extensive aortoiliac atherosclerotic calcification without evidence of aneurysm. Extensive atherosclerotic calcification is seen within the common iliac arteries bilaterally and left external iliac artery, however, the degree of stenosis is not well assessed on this  examination. Right femoral bypass graft is partially visualized with occlusion of the superficial femoral artery proximally. Extensive atherosclerotic calcification noted within the visualized left lower extremity arterial outflow. No pathologic adenopathy within the abdomen and pelvis. Reproductive: Prostate is unremarkable. Other: None significant Musculoskeletal: No lytic or blastic bone lesions are seen. Degenerative changes are seen within the lumbar spine. IMPRESSION: No definite radiographic explanation for the patient's symptoms. Moderate stool within the rectal vault raising the question of fecal impaction. Aortic Atherosclerosis (ICD10-I70.0). Electronically Signed   By: Helyn Numbers MD   On: 09/09/2020 22:39     Labs:   Basic Metabolic Panel: Recent Labs  Lab 09/09/20 2108 09/09/20 2108 09/10/20 0700 09/10/20 0700 09/11/20 1231 09/12/20 0357  NA 136  --  135  --  137 140  K 4.4   < > 4.2   < > 4.1 3.7  CL 102  --  102  --  106 107  CO2 23  --  21*  --  21* 22  GLUCOSE 141*  --  104*  --  120* 105*  BUN 19  --  17  --  17 14  CREATININE 1.26*  --  1.31*  --  1.08 1.04  CALCIUM 9.6  --  9.3  --  8.7* 8.7*   < > = values in this interval not displayed.   GFR Estimated Creatinine Clearance: 80.1 mL/min (by C-G formula based on SCr of 1.04 mg/dL). Liver Function Tests: Recent Labs  Lab 09/09/20 2108 09/10/20 0700  AST 32 36  ALT 36 33  ALKPHOS 99 87  BILITOT 0.9 1.0  PROT 7.0 6.6  ALBUMIN 3.8 3.4*   Recent Labs  Lab 09/09/20 2108  LIPASE 21   No results for input(s): AMMONIA in the last 168 hours. Coagulation profile Recent Labs  Lab 09/10/20 0700  INR 1.1    CBC: Recent Labs  Lab 09/09/20 2108 09/10/20 0700 09/11/20 1231 09/12/20 0357  WBC 19.9* 19.4* 12.6* 9.5  NEUTROABS 18.3*  --   --  5.7  HGB 11.6* 11.0* 9.7* 9.5*  HCT 36.5* 35.9* 30.9* 30.3*  MCV 86.1 86.9 84.9 84.4  PLT 354 310 239 243   Cardiac Enzymes: No results for input(s):  CKTOTAL, CKMB, CKMBINDEX, TROPONINI in the last 168 hours. BNP: Invalid input(s): POCBNP CBG: No results for input(s): GLUCAP in the last 168 hours. D-Dimer No results for input(s): DDIMER in the last 72 hours. Hgb A1c No results for input(s): HGBA1C in the last 72 hours. Lipid Profile No results for input(s): CHOL, HDL, LDLCALC, TRIG, CHOLHDL, LDLDIRECT in the last 72 hours. Thyroid function studies No results for input(s): TSH, T4TOTAL, T3FREE, THYROIDAB in the last 72 hours.  Invalid input(s): FREET3 Anemia work up No results for input(s): VITAMINB12, FOLATE, FERRITIN, TIBC, IRON, RETICCTPCT in the last 72 hours. Microbiology Recent Results (from the past 240 hour(s))  SARS Coronavirus 2 by RT PCR (hospital order, performed in [facility] hospital lab) Nasopharyngeal Nasopharyngeal Swab     Status: None   Collection Time: 09/09/20  1:03 AM   Specimen: Nasopharyngeal Swab  Result Value Ref Range Status   SARS Coronavirus 2 NEGATIVE NEGATIVE  Final    Comment: (NOTE) SARS-CoV-2 target nucleic acids are NOT DETECTED.  The SARS-CoV-2 RNA is generally detectable in upper and lower respiratory specimens during the acute phase of infection. The lowest concentration of SARS-CoV-2 viral copies this assay can detect is 250 copies / mL. A negative result does not preclude SARS-CoV-2 infection and should not be used as the sole basis for treatment or other patient management decisions.  A negative result may occur with improper specimen collection / handling, submission of specimen other than nasopharyngeal swab, presence of viral mutation(s) within the areas targeted by this assay, and inadequate number of viral copies (<250 copies / mL). A negative result must be combined with clinical observations, patient history, and epidemiological information.  Fact Sheet for Patients:   BoilerBrush.com.cy  Fact Sheet for Healthcare Providers:  https://pope.com/  This test is not yet approved or  cleared by the [place] FDA and has been authorized for detection and/or diagnosis of SARS-CoV-2 by FDA under an Emergency Use Authorization (EUA).  This EUA will remain in effect (meaning this test can be used) for the duration of the COVID-19 declaration under Section 564(b)(1) of the Act, 21 U.S.C. section 360bbb-3(b)(1), unless the authorization is terminated or revoked sooner.  Performed at [facility], [address], [place], [place] [address]   Blood culture (routine x 2)     Status: None (Preliminary result)   Collection Time: 09/09/20  3:25 AM   Specimen: BLOOD  Result Value Ref Range Status   Specimen Description BLOOD SITE NOT SPECIFIED  Final   Special Requests   Final    BOTTLES DRAWN AEROBIC AND ANAEROBIC Blood Culture adequate volume   Culture   Final    NO GROWTH 1 DAY Performed at [facility], [address] [place], [place] [address]    Report Status PENDING  Incomplete  Urine culture     Status: Abnormal   Collection Time: 09/09/20 10:46 PM   Specimen: Urine, Random  Result Value Ref Range Status   Specimen Description URINE, RANDOM  Final   Special Requests   Final    NONE Performed at [facility], [address] [place], [place] [address]    Culture >=100,000 COLONIES/mL ESCHERICHIA COLI (A)  Final   Report Status 09/11/2020 FINAL  Final   Organism ID, Bacteria ESCHERICHIA COLI (A)  Final      Susceptibility   Escherichia coli - MIC*    AMPICILLIN >=32 RESISTANT Resistant     CEFAZOLIN 16 SENSITIVE Sensitive     CEFTRIAXONE <=0.25 SENSITIVE Sensitive     CIPROFLOXACIN <=0.25 SENSITIVE Sensitive     GENTAMICIN <=1 SENSITIVE Sensitive     IMIPENEM <=0.25 SENSITIVE Sensitive     NITROFURANTOIN <=16 SENSITIVE Sensitive     TRIMETH/SULFA >=320 RESISTANT Resistant     AMPICILLIN/SULBACTAM >=32 RESISTANT Resistant     PIP/TAZO <=4 SENSITIVE Sensitive      * >=100,000 COLONIES/mL ESCHERICHIA COLI  Blood culture (routine x 2)     Status: None (Preliminary result)   Collection Time: 09/10/20 12:00 AM   Specimen: BLOOD  Result Value Ref Range Status   Specimen Description BLOOD SITE NOT SPECIFIED  Final   Special Requests   Final    BOTTLES DRAWN AEROBIC AND ANAEROBIC Blood Culture results may not be optimal due to an inadequate volume of blood received in culture bottles   Culture   Final    NO GROWTH 1 DAY Performed  at [facility], [address] [place], [place] [address]    Report Status PENDING  Incomplete  Culture, blood (single)     Status: None (Preliminary result)   Collection Time: 09/10/20  9:22 AM   Specimen: BLOOD  Result Value Ref Range Status   Specimen Description BLOOD SITE NOT SPECIFIED  Final   Special Requests   Final    BOTTLES DRAWN AEROBIC AND ANAEROBIC Blood Culture results may not be optimal due to an excessive volume of blood received in culture bottles   Culture   Final    NO GROWTH 1 DAY Performed at [facility], [address] [place], [place] [address]    Report Status PENDING  Incomplete     Discharge Instructions:   Discharge Instructions    Diet - low sodium heart healthy   Complete by: As directed    Discharge instructions   Complete by: As directed    Follow up with your primary care provider in one week. Complete the course of antibiotics. Follow up with your orthopedics as has been scheduled by you. Nicotine patch has been prescribed to assist you quit smoking.   Increase activity slowly   Complete by: As directed      Allergies as of 09/12/2020      Reactions   Ace Inhibitors Swelling   Angioedema   Dairy Aid [lactase]    gas   Eggs Or Egg-derived Products    Cannot eat Prepared Eggs   Latex Itching      Medication List    STOP taking these medications   HYDROcodone-acetaminophen 5-325 MG tablet Commonly known as: NORCO/VICODIN   loratadine 10 MG tablet  Commonly known as: CLARITIN   sulfamethoxazole-trimethoprim 800-160 MG tablet Commonly known as: BACTRIM DS     TAKE these medications   acetaminophen 325 MG tablet Commonly known as: TYLENOL Take 2 tablets (650 mg total) by mouth 4 (four) times daily -  before meals and at bedtime. What changed: when to take this   aspirin EC 81 MG tablet Take 81 mg by mouth daily.   atorvastatin 80 MG tablet Commonly known as: LIPITOR TAKE 1 TABLET BY MOUTH  DAILY AT 6 PM   BEN GAY EX Apply 1 application topically as needed (typically right knee).   Calcium 600+D 600-800 MG-UNIT Tabs Generic drug: Calcium Carb-Cholecalciferol Take 1 tablet by mouth daily.   cephALEXin 250 MG capsule Commonly known as: KEFLEX Take 1 capsule (250 mg total) by mouth 3 (three) times daily for 5 days.   citalopram 20 MG tablet Commonly known as: CELEXA Take 1 tablet (20 mg total) by mouth daily. What changed:   how much to take  when to take this   clonazePAM 0.25 MG disintegrating tablet Commonly known as: KLONOPIN Take 1 tablet (0.25 mg total) by mouth 2 (two) times daily as needed. What changed: when to take this   clopidogrel 75 MG tablet Commonly known as: PLAVIX Take 1 tablet (75 mg total) by mouth daily.   docusate sodium 100 MG capsule Commonly known as: COLACE Take 100 mg by mouth daily.   fluticasone 50 MCG/ACT nasal spray Commonly known as: FLONASE Place 2 sprays into both nostrils daily.   gabapentin 100 MG capsule Commonly known as: NEURONTIN Take 1 capsule (100 mg total) by mouth 2 (two) times daily.   irbesartan 300 MG tablet Commonly known as: AVAPRO Take 1 tablet (300 mg total) by mouth daily.   melatonin  3 MG Tabs tablet Take 6 mg by mouth at bedtime.   metoprolol tartrate 25 MG tablet Commonly known as: LOPRESSOR Take 1 tablet (25 mg total) by mouth 2 (two) times daily.   multivitamin with minerals Tabs tablet Take 1 tablet by mouth daily.   nicotine 7 mg/24hr  patch Commonly known as: NICODERM CQ - dosed in mg/24 hr Place 1 patch (7 mg total) onto the skin daily.   pantoprazole 40 MG tablet Commonly known as: PROTONIX Take 1 tablet (40 mg total) by mouth at bedtime.   QUEtiapine 200 MG tablet Commonly known as: SEROQUEL Take 1 tablet (200 mg total) by mouth 2 (two) times daily.   tamsulosin 0.4 MG Caps capsule Commonly known as: FLOMAX Take 2 capsules (0.8 mg total) by mouth daily with supper.   traMADol 50 MG tablet Commonly known as: ULTRAM Take 50 mg by mouth daily.       Follow-up Information    Wanda PlumpPaz, Jose E, MD. Schedule an appointment as soon as possible for a visit in 1 week(s).   Specialty: Internal Medicine Why: regular followiup Contact information: [address] [place] [address] [phone]        Kathleene HazelMcAlhany, Christopher D, MD .   Specialty: Cardiology Contact information: [address] [place] [address] [phone]                Time coordinating discharge: 39 minutes  Signed:  Laxman Pokhrel  [facility] 09/12/2020, 9:09 AM

## 2020-09-13 ENCOUNTER — Telehealth: Payer: Self-pay

## 2020-09-13 NOTE — Telephone Encounter (Signed)
Transition Care Management Unsuccessful Follow-up Telephone Call  Date of discharge and from where:  09/12/20-[place]  Attempts:  3rd Attempt  Reason for unsuccessful TCM follow-up call:  Left voice message

## 2020-09-13 NOTE — Telephone Encounter (Signed)
Patient's wife, Parvin Stetzer, returned your call. I let her know you were out of the office and will call her back again.

## 2020-09-14 ENCOUNTER — Telehealth: Payer: Self-pay

## 2020-09-14 ENCOUNTER — Telehealth: Payer: Self-pay | Admitting: Internal Medicine

## 2020-09-14 NOTE — Telephone Encounter (Signed)
Mitzo They are requesting a virtual follow-up from the recent hospital admission, okay to set it up that way. Please let them know that he still may need to come briefly to the office for blood work. Also, I will be off next week, okay to arrange with another provider in the office if needed.

## 2020-09-14 NOTE — Telephone Encounter (Signed)
LMOM for [employer], verbal orders given.

## 2020-09-14 NOTE — Telephone Encounter (Signed)
Transition Care Management Follow-up Telephone Call  Date of discharge and from where: 09/12/20-[place]  How have you been since you were released from the hospital?Per wife- Having a hard time with right leg from recent fracture. Swollen. Both legs have decreased circulation.  Any questions or concerns? Yes- Concern of being able to come into the office for a face-to-face visit due to patient's condition & dementia.  Items Reviewed:  Did the pt receive and understand the discharge instructions provided? No  Per wife & daughter, discharge instructions were not given to them. They were given to patient who has dementia. Discharge instructions reviewed with wife & daughter today.  Medications obtained and verified? Yes   Any new allergies since your discharge? No   Dietary orders reviewed? Yes  Do you have support at home? Yes   Functional Questionnaire: (I = Independent and D = Dependent) ADLs: D  Bathing/Dressing- D  Meal Prep- D  Eating- I  Maintaining continence- D  Transferring/Ambulation- D  Managing Meds- D  Follow up appointments reviewed:   PCP Hospital f/u appt confirmed? No Wife & daughter awaiting call back from office to see if virtual appt is possible due to patient's condition.  Specialist Hospital f/u appt confirmed? No  Daughter is calling to schedule  Are transportation arrangements needed? No   If their condition worsens, is the pt aware to call PCP or go to the Emergency Dept.? Yes  Was the patient provided with contact information for the PCP's office or ED? Yes  Was to pt encouraged to call back with questions or concerns? Yes

## 2020-09-14 NOTE — Telephone Encounter (Signed)
Caller: Tresa Endo -[facility]  Call back # [phone]  Requesting a verbal for patient to continue PT   For : 1 x 1            2 x 2  Also a social worker request, per Tresa Endo, family is thinking of placing patient a facility.

## 2020-09-14 NOTE — Telephone Encounter (Signed)
Tiffany- that was an incorrect number- can you double check the number for me please?

## 2020-09-14 NOTE — Telephone Encounter (Signed)
[phone], number for [employer]

## 2020-09-15 LAB — CULTURE, BLOOD (ROUTINE X 2)
Culture: NO GROWTH
Culture: NO GROWTH
Special Requests: ADEQUATE

## 2020-09-15 LAB — CULTURE, BLOOD (SINGLE): Culture: NO GROWTH
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
## 2020-09-17 ENCOUNTER — Telehealth: Payer: Self-pay | Admitting: Internal Medicine

## 2020-09-17 ENCOUNTER — Encounter: Payer: Self-pay | Admitting: General Practice

## 2020-09-17 ENCOUNTER — Telehealth (INDEPENDENT_AMBULATORY_CARE_PROVIDER_SITE_OTHER): Payer: Medicare Other | Admitting: General Practice

## 2020-09-17 VITALS — BP 141/96 | HR 81 | Temp 97.9°F

## 2020-09-17 DIAGNOSIS — Z79899 Other long term (current) drug therapy: Secondary | ICD-10-CM

## 2020-09-17 DIAGNOSIS — E785 Hyperlipidemia, unspecified: Secondary | ICD-10-CM | POA: Diagnosis not present

## 2020-09-17 DIAGNOSIS — I2510 Atherosclerotic heart disease of native coronary artery without angina pectoris: Secondary | ICD-10-CM | POA: Diagnosis not present

## 2020-09-17 DIAGNOSIS — I739 Peripheral vascular disease, unspecified: Secondary | ICD-10-CM

## 2020-09-17 DIAGNOSIS — I10 Essential (primary) hypertension: Secondary | ICD-10-CM

## 2020-09-17 DIAGNOSIS — I255 Ischemic cardiomyopathy: Secondary | ICD-10-CM | POA: Diagnosis not present

## 2020-09-17 MED ORDER — POTASSIUM CHLORIDE ER 10 MEQ PO TBCR: 10.0000 meq | EXTENDED_RELEASE_TABLET | Freq: Every day | ORAL | 0 refills | Status: DC

## 2020-09-17 MED ORDER — FUROSEMIDE 20 MG PO TABS: 20.0000 mg | ORAL_TABLET | Freq: Every day | ORAL | 0 refills | Status: DC

## 2020-09-17 NOTE — Telephone Encounter (Signed)
Noted  

## 2020-09-17 NOTE — Telephone Encounter (Signed)
Caller name: Angie Call back number: [phone]  Lonnie [name] is calling to inform you patient daughter has cancelled all physical therapy for this week due to patient having lots of appointments due to leg swelling.

## 2020-09-17 NOTE — Patient Instructions (Signed)
Medication Instructions:  TAKE LASIX 20MG  x3DAYS THEN STOP  TAKE POTASSIUM x3DAYS THEN STOP *If you need a refill on your cardiac medications before your next appointment, please call your pharmacy*  Lab Work:   Testing/Procedures:  BMET-NEXT WEEK  NONE  Special Instructions CONTINUE DAILY WEIGHTS IF YOUR WEIGHT IS >3 POUNDS DAILY OR >5 POUNDS IN ONE WEEK. PLEASE CALL OUR OFFICE TO LET KNOW WHEN YOU HAVE INCREASED WEIGHT SO WE MAY ADJUST MEDICATIONS [phone].  PLEASE READ AND FOLLOW SALTY 6-ATTACHED  Follow-Up: Your next appointment:  1 month(s) In Person with [phone], PA-C 09-28-2020 @315pm  AT [address] OFFICE.   At [facility], you and your health needs are our priority.  As part of our continuing mission to provide you with exceptional heart care, we have created designated Provider Care Teams.  These Care Teams include your primary Cardiologist (physician) and Advanced Practice Providers (APPs -  Physician Assistants and Nurse Practitioners) who all work together to provide you with the care you need, when you need it.  We recommend signing up for the patient portal called "MyChart".  Sign up information is provided on this After Visit Summary.  MyChart is used to connect with patients for Virtual Visits (Telemedicine).  Patients are able to view lab/test results, encounter notes, upcoming appointments, etc.  Non-urgent messages can be sent to your provider as well.   To learn more about what you can do with MyChart, go to .

## 2020-09-19 NOTE — Progress Notes (Signed)
[facility] [address] [place], [place] [address] [phone]  Date:  09/20/2020   Name:  Lonnie [name]   DOB:  1951/04/23   MRN:  [id number]  PCP:  [name], MD    Chief Complaint: No chief complaint on file.   History of Present Illness:  Lonnie [name] is a 69 y.o. very pleasant male patient who presents with the following:  Virtual visit today for hospital follow-up-this patient typically sees my partner Dr. Drue Novel.  I have not seen him myself previously, medical history significant of CAD s/p CABG, HTN, PAD, ICM, fronto-temporal dementia.  Patient location is home, provider location is office.  Connected with patient via video, patient identity confirmed with 2 factors.  Patient suffers with dementia, much of the history is through his daughter [name] today  Elvin was recently admitted to the hospital as follows with urosepsis A few days prior to admission the patient fell and broke his right patella.  He was in a knee immobilizer He had also been treated for a UTI earlier in the month.  He was found down at home, seemed confused so his family brought him to the hospital  Admit date: 09/09/2020 Discharge date: 09/12/2020 Admitted From: Home Discharge disposition: Home with Home PT  Recommendations for Outpatient Follow-Up:    Follow up with your primary care provider in one week.   Check CBC, BMP, magnesium in the next visit  Continue follow-up with your orthopedic surgeon as scheduled  Discharge Diagnosis:   Principal Problem:   Sepsis secondary to UTI [facility]) Active Problems:   HTN (hypertension)   Coronary atherosclerosis of native coronary artery   E-coli UTI   Fronto-temporal dementia (HCC)   PAD (peripheral artery disease) (HCC)   Acute metabolic encephalopathy   Closed fracture of right patella  Discharge Condition: Improved. Diet recommendation: Low sodium, heart healthy.   Wound care: None. Code status: Full.  History of Present Illness:  Lonnie Kovaleski Turrentineis a 69 y.o.year old malewith medical history significant for CAD s/p CABG, HTN, PAD, ICM, fronto-temporal dementia who presented on 9/19/2021with increased falls after a recent fall a week ago leading to right patellar fracture and recently treated as an outpatient with Bactrim x10 days for UTI symptoms. He was found on the floor his home by his wife and his own emesis alert but confused. Patient found to have severe sepsis secondary to presumed UTI started on cefepime while monitoring urine cultures.  Hospital Course:   Following conditions were addressed during hospitalization as listed below,  Severe sepsis secondary toE.coliUTI.  Patient had failed outpatient treatment with Bactrim.  Patient did have a history of Pseudomonas UTI in the past and was initially started on cefepime.  Urine culture showed E. coli which was resistant to Bactrim but sensitive to Keflex.  Keflex was then started.  Sepsis physiology has resolved at this time.  Patient will be on Keflex on discharge to complete the course. AKI, prerenal in the setting of infection. Baseline creatinine 0.99.  Improved.  Creatinine prior to discharge was 1.05. Constipation. CT scan concerning for fecal impaction.  Continue colace on home.  Patient has had  bowel movements. Right Patella fracture.  Patient has been seen by orthopedics as outpatient and is currently on  Immobilizer/brace. plan to monitor and reassess with imaging in about a week   BPH,.  Flomax Normocytic anemia, chronic, stable CBC as outpatient. Hyperlipidemia, continue  statin.  Frontal temporal dementia with mood disorder.  On Celexa, Seroquel GERD, stable On PPI Essential hypertension.Currently at goal. on Lopressor and irbesartan Peripheral neuropathy, stable. Continue Neurontin CAD status post CABG. Without any chest pain. Continue home aspirin and  Plavix Tobacco Abuse.Still smokes. Consider nicotine patch on discharge.  Prescription has been sent.   [name] notes that her dad seems much better now.  He uses a urinal so she can monitor his urine- much more clear and normal.  She thinks his urinary tract infection is cleared up He seems his normal self as far as mental status He has finished his abx They are trying to keep him hydrated   He has seen Dr Madelon Lips for his knee- earlier this week- the plan is to allow the leg the heal without surgery.   They also recently did a virtual visit with cardiology, September 27.  At that time he was having some swelling of his right leg.  They prescribed 3 days of furosemide and potassium supplementation.  This is really helped with his leg swelling, now it seems normal  The patient is compliant with keeping his leg elevated.  His daughter feels that he is doing well  Patient Active Problem List   Diagnosis Date Noted  . Ischemic cardiomyopathy   . Sepsis secondary to UTI (HCC) 09/10/2020  . Acute metabolic encephalopathy 09/10/2020  . Closed fracture of right patella 09/10/2020  . PAD (peripheral artery disease) (HCC) 01/20/2020  . Fronto-temporal dementia (HCC) 08/04/2019  . Left shoulder pain   . TBI (traumatic brain injury) (HCC)   . E-coli UTI   . Urinary retention   . Postoperative pain   . Agitation   . SDH (subdural hematoma) (HCC) 07/08/2019  . Subdural hematoma (HCC) 06/30/2019  . Erectile dysfunction 07/28/2018  . Peripheral vascular disease (HCC) 07/16/2017  . Anxiety and depression 07/16/2017  . PCP NOTES >>>>>>>>>>>> 07/08/2016  . Dermatitis 04/19/2015  . Elevated LFTs 02/12/2015  . Hyperglycemia 09/01/2014  . DJD (degenerative joint disease) 09/01/2014  . Annual physical exam 09/01/2014  . S/P CABG x 4 11/07/2013  . HTN (hypertension) 10/29/2013  . Tobacco abuse 10/29/2013  . Coronary atherosclerosis of native coronary artery 10/29/2013    Past Medical History:   Diagnosis Date  . Allergy   . Anxiety   . Arthritis    left neck, shoulder, knee  . CAD (coronary artery disease)    a. anterior STEMI 10/2013 s/p 4V CABG with LIMA to mid LAD, SVG to OM, SVG to PDA, SVG to Diagonal.  . Dementia (HCC)   . Depression   . Falls   . Hypertension   . Ischemic cardiomyopathy    a. EF 40-45% at time of CABG and in 2018.  . MI (myocardial infarction) (HCC)   . Pre-diabetes   . Prediabetes 09/01/2014  . PVD (peripheral vascular disease) (HCC)    a. s/p L SFA stents with now known bilateral SFA occlusion treated medically.  . Stroke [facility])    seen on CT Scan  . Tobacco abuse     Past Surgical History:  Procedure Laterality Date  . ABDOMINAL AORTAGRAM N/A 06/07/2014   Procedure: ABDOMINAL Ronny Flurry;  Surgeon: Iran Ouch, MD;  Location: [facility] CATH LAB;  Service: Cardiovascular;  Laterality: N/A;  . ABDOMINAL AORTAGRAM N/A 03/07/2015   Procedure: ABDOMINAL Ronny Flurry;  Surgeon: Iran Ouch, MD;  Location: [facility] CATH LAB;  Service: Cardiovascular;  Laterality: N/A;  . ABDOMINAL AORTOGRAM W/LOWER EXTREMITY N/A 01/18/2020  Procedure: ABDOMINAL AORTOGRAM W/LOWER EXTREMITY - Right;  Surgeon: Iran OuchArida, Muhammad A, MD;  Location: [facility] INVASIVE CV LAB;  Service: Cardiovascular;  Laterality: N/A;  . APPENDECTOMY    . COLONOSCOPY WITH PROPOFOL N/A 10/11/2015   Procedure: COLONOSCOPY WITH PROPOFOL;  Surgeon: Charna ElizabethJyothi Mann, MD;  Location: [facility] ENDOSCOPY;  Service: Endoscopy;  Laterality: N/A;  . CORONARY ARTERY BYPASS GRAFT N/A 11/04/2013   Procedure: CORONARY ARTERY BYPASS GRAFTING (CABG) TIMES FOUR  USING LEFT INTERNAL MAMMARY ARTERY AND RIGHT AND LEFT SAPHENOUS LEG VEIN HARVESTED ENDOSCOPICALLY;  Surgeon: Loreli SlotSteven C Hendrickson, MD;  [facility];  Service: Open Heart Surgery;  Laterality: N/A;  . CRANIOTOMY Left 06/30/2019   Procedure: Frontal CRANIOTOMY HEMATOMA EVACUATION SUBDURAL;  Surgeon: Lisbeth RenshawNundkumar, Neelesh, MD;  Location: [facility] OR;  Service: Neurosurgery;  Laterality:  Left;  Frontal CRANIOTOMY HEMATOMA EVACUATION SUBDURAL  . FEMORAL-POPLITEAL BYPASS GRAFT Right 01/20/2020   Procedure: BYPASS GRAFT FEMORAL-POPLITEAL ARTERY RIGHT LEG USING GORE PROPATEN GRAFT;  Surgeon: Nada LibmanBrabham, Vance W, MD;  Location: [facility] OR;  Service: Vascular;  Laterality: Right;  . FINGER SURGERY  2017   injury  . I & D EXTREMITY Right 03/02/2020   Procedure: IRRIGATION AND DEBRIDEMENT EXTREMITY right  lower leg  with Antibiotic beads.;  Surgeon: Chuck Hintickson, Christopher S, MD;  [facility];  Service: Vascular;  Laterality: Right;  . KNEE SURGERY     fractured patella  . LEFT HEART CATH N/A 10/29/2013   Procedure: LEFT HEART CATH;  Surgeon: Kathleene Hazelhristopher D McAlhany, MD;  [facility];  Service: Cardiovascular;  Laterality: N/A;  . LEFT HEART CATHETERIZATION WITH CORONARY ANGIOGRAM N/A 10/31/2013   Procedure: LEFT HEART CATHETERIZATION WITH CORONARY ANGIOGRAM;  Surgeon: Kathleene Hazelhristopher D McAlhany, MD;  [facility];  Service: Cardiovascular;  Laterality: N/A;  . MOUTH SURGERY    . TOE SURGERY      Social History   Tobacco Use  . Smoking status: Current Every Day Smoker    Packs/day: 0.25    Years: 30.00    Pack years: 7.50    Types: Cigarettes  . Smokeless tobacco: Never Used  . Tobacco comment: < 1/2 ppd  Vaping Use  . Vaping Use: Never used  Substance Use Topics  . Alcohol use: No    Alcohol/week: 0.0 standard drinks  . Drug use: No    Family History  Problem Relation Age of Onset  . CAD Father 3669  . AAA (abdominal aortic aneurysm) Father   . Alcohol abuse Father   . Hypertension Father   . Diabetes Father   . Heart attack Father   . Stroke Mother   . Arthritis Mother   . Heart disease Mother   . Hypertension Mother   . Heart attack Mother   . Cardiomyopathy Daughter   . Colon cancer Neg Hx   . Prostate cancer Neg Hx     Allergies  Allergen Reactions  . Ace Inhibitors Swelling    Angioedema  . Dairy Aid [Lactase]     gas  . Eggs Or Egg-Derived  Products     Cannot eat Prepared Eggs  . Latex Itching    Medication list has been reviewed and updated.  Current Outpatient Medications on File Prior to Visit  Medication Sig Dispense Refill  . acetaminophen (TYLENOL) 325 MG tablet Take 2 tablets (650 mg total) by mouth 4 (four) times daily -  before meals and at bedtime. (Patient taking differently: Take 650 mg by mouth 2 (two) times daily. )    .  aspirin EC 81 MG tablet Take 81 mg by mouth daily.    Marland Kitchen atorvastatin (LIPITOR) 80 MG tablet TAKE 1 TABLET BY MOUTH  DAILY AT 6 PM 90 tablet 1  . Calcium Carb-Cholecalciferol (CALCIUM 600+D) 600-800 MG-UNIT TABS Take 1 tablet by mouth daily.    . citalopram (CELEXA) 20 MG tablet Take 1 tablet (20 mg total) by mouth daily. (Patient taking differently: Take 10 mg by mouth 2 (two) times daily. ) 90 tablet 1  . clonazePAM (KLONOPIN) 0.25 MG disintegrating tablet Take 1 tablet (0.25 mg total) by mouth 2 (two) times daily as needed. (Patient taking differently: Take 0.25 mg by mouth 2 (two) times daily. ) 180 tablet 0  . clopidogrel (PLAVIX) 75 MG tablet Take 1 tablet (75 mg total) by mouth daily. 30 tablet 11  . docusate sodium (COLACE) 100 MG capsule Take 100 mg by mouth daily.    . fluticasone (FLONASE) 50 MCG/ACT nasal spray Place 2 sprays into both nostrils daily.  2  . furosemide (LASIX) 20 MG tablet Take 1 tablet (20 mg total) by mouth daily. TAKE 20MG  x3 DAYS THEN STOP 3 tablet 0  . gabapentin (NEURONTIN) 100 MG capsule Take 1 capsule (100 mg total) by mouth 2 (two) times daily. 180 capsule 1  . irbesartan (AVAPRO) 300 MG tablet Take 1 tablet (300 mg total) by mouth daily. 90 tablet 1  . Liniments (BEN GAY EX) Apply 1 application topically as needed (typically right knee).    . Melatonin 3 MG TABS Take 6 mg by mouth at bedtime.     . metoprolol tartrate (LOPRESSOR) 25 MG tablet Take 1 tablet (25 mg total) by mouth 2 (two) times daily. 180 tablet 1  . Multiple Vitamin (MULTIVITAMIN WITH MINERALS)  TABS tablet Take 1 tablet by mouth daily.    . nicotine (NICODERM CQ - DOSED IN MG/24 HR) 7 mg/24hr patch Place 1 patch (7 mg total) onto the skin daily. 28 patch 0  . pantoprazole (PROTONIX) 40 MG tablet Take 1 tablet (40 mg total) by mouth at bedtime. 90 tablet 1  . potassium chloride (KLOR-CON) 10 MEQ tablet Take 1 tablet (10 mEq total) by mouth daily. TAKE x3 DAYS THEN STOP 3 tablet 0  . QUEtiapine (SEROQUEL) 200 MG tablet Take 1 tablet (200 mg total) by mouth 2 (two) times daily. 180 tablet 1  . tamsulosin (FLOMAX) 0.4 MG CAPS capsule Take 2 capsules (0.8 mg total) by mouth daily with supper. 180 capsule 1  . traMADol (ULTRAM) 50 MG tablet Take 50 mg by mouth daily.      No current facility-administered medications on file prior to visit.    Review of Systems:  As per HPI- otherwise negative.   Physical Examination: There were no vitals filed for this visit. There were no vitals filed for this visit. There is no height or weight on file to calculate BMI. Ideal Body Weight:    Patient observed via video monitor.  Older appearing black male in a recliner.  He appears comfortable and calm.  I was able to view his right leg, there is no significant swelling or evidence of cellulitis at this time Recent BP 140/80 approx per his daughter  Assessment and Plan: Hospital discharge follow-up  Urinary tract infection without hematuria, site unspecified  Virtual follow-up visit today for patient was recently admitted with urosepsis.  He is doing quite a bit better,heis also being followed by cardiology and ortho Made an in person appt for pt  next week w/ me so we can examine his leg in person and get labs Daughter will contact me if any concerns or problems in the meantime Video used for entirely of visit today   Signed Abbe Amsterdam, MD

## 2020-09-20 ENCOUNTER — Other Ambulatory Visit: Payer: Self-pay

## 2020-09-20 ENCOUNTER — Telehealth (INDEPENDENT_AMBULATORY_CARE_PROVIDER_SITE_OTHER): Payer: Medicare Other | Admitting: Family Medicine

## 2020-09-20 DIAGNOSIS — Z09 Encounter for follow-up examination after completed treatment for conditions other than malignant neoplasm: Secondary | ICD-10-CM

## 2020-09-20 DIAGNOSIS — N390 Urinary tract infection, site not specified: Secondary | ICD-10-CM

## 2020-09-24 ENCOUNTER — Telehealth: Payer: Self-pay

## 2020-09-24 NOTE — Telephone Encounter (Signed)
Multiple plans of care and orders signed and faxed back to [facility] at [phone]. Forms sent for scanning.
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
## 2020-09-26 ENCOUNTER — Other Ambulatory Visit: Payer: Self-pay

## 2020-09-26 ENCOUNTER — Telehealth: Payer: Self-pay | Admitting: Physician Assistant

## 2020-09-26 ENCOUNTER — Encounter: Payer: Self-pay | Admitting: Physician Assistant

## 2020-09-26 ENCOUNTER — Ambulatory Visit (INDEPENDENT_AMBULATORY_CARE_PROVIDER_SITE_OTHER): Payer: Medicare Other | Admitting: Family Medicine

## 2020-09-26 VITALS — BP 118/62 | HR 63 | Resp 17

## 2020-09-26 DIAGNOSIS — M25473 Effusion, unspecified ankle: Secondary | ICD-10-CM | POA: Diagnosis not present

## 2020-09-26 DIAGNOSIS — A419 Sepsis, unspecified organism: Secondary | ICD-10-CM | POA: Diagnosis not present

## 2020-09-26 DIAGNOSIS — D649 Anemia, unspecified: Secondary | ICD-10-CM | POA: Diagnosis not present

## 2020-09-26 DIAGNOSIS — Z09 Encounter for follow-up examination after completed treatment for conditions other than malignant neoplasm: Secondary | ICD-10-CM | POA: Diagnosis not present

## 2020-09-26 MED ORDER — FUROSEMIDE 20 MG PO TABS: 20.0000 mg | ORAL_TABLET | Freq: Every day | ORAL | 0 refills | Status: DC

## 2020-09-26 NOTE — Telephone Encounter (Signed)
Note opened in error.

## 2020-09-26 NOTE — Patient Instructions (Signed)
It was good to see you doing better today! I will be in touch with your labs [facility] to do a covid 19 booster at your earliest convenience! This can be given with your flu shot Use furosemide as needed for swelling If you need to use this more than a couple of times a week let me know and I can call in some potassium for you as well
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
## 2020-09-27 ENCOUNTER — Encounter: Payer: Self-pay | Admitting: Family Medicine

## 2020-09-27 ENCOUNTER — Telehealth: Payer: Self-pay | Admitting: Internal Medicine

## 2020-09-27 LAB — CBC
HCT: 36.1 % — ABNORMAL LOW (ref 38.5–50.0)
Hemoglobin: 11.6 g/dL — ABNORMAL LOW (ref 13.2–17.1)
MCH: 26.8 pg — ABNORMAL LOW (ref 27.0–33.0)
MCHC: 32.1 g/dL (ref 32.0–36.0)
MCV: 83.4 fL (ref 80.0–100.0)
MPV: 9.6 fL (ref 7.5–12.5)
Platelets: 397 10*3/uL (ref 140–400)
RBC: 4.33 10*6/uL (ref 4.20–5.80)
RDW: 13.4 % (ref 11.0–15.0)
WBC: 7.2 10*3/uL (ref 3.8–10.8)

## 2020-09-27 LAB — COMPREHENSIVE METABOLIC PANEL
AG Ratio: 1.3 (calc) (ref 1.0–2.5)
ALT: 37 U/L (ref 9–46)
AST: 31 U/L (ref 10–35)
Albumin: 4.2 g/dL (ref 3.6–5.1)
Alkaline phosphatase (APISO): 115 U/L (ref 35–144)
BUN: 17 mg/dL (ref 7–25)
CO2: 23 mmol/L (ref 20–32)
Calcium: 10.2 mg/dL (ref 8.6–10.3)
Chloride: 105 mmol/L (ref 98–110)
Creat: 1.16 mg/dL (ref 0.70–1.25)
Globulin: 3.2 g/dL (calc) (ref 1.9–3.7)
Glucose, Bld: 87 mg/dL (ref 65–99)
Potassium: 5.4 mmol/L — ABNORMAL HIGH (ref 3.5–5.3)
Sodium: 140 mmol/L (ref 135–146)
Total Bilirubin: 0.5 mg/dL (ref 0.2–1.2)
Total Protein: 7.4 g/dL (ref 6.1–8.1)

## 2020-09-27 NOTE — Addendum Note (Signed)
Addended by: Abbe Amsterdam C on: 09/27/2020 05:46 AM   Modules accepted: Orders

## 2020-09-27 NOTE — Telephone Encounter (Signed)
Caller [name] home care Call Back # [phone]  Patient states she would like an extension of home health orders . Per kelly patient a patella fracture   Frequency : 1 x 4           2 x 3           1x 2

## 2020-09-27 NOTE — Telephone Encounter (Signed)
LMOM for Kelly w/ verbal orders.  

## 2020-09-28 ENCOUNTER — Ambulatory Visit: Payer: Medicare Other | Admitting: Physician Assistant

## 2020-10-01 ENCOUNTER — Telehealth: Payer: Self-pay | Admitting: Internal Medicine

## 2020-10-01 DIAGNOSIS — R410 Disorientation, unspecified: Secondary | ICD-10-CM

## 2020-10-01 DIAGNOSIS — R399 Unspecified symptoms and signs involving the genitourinary system: Secondary | ICD-10-CM

## 2020-10-01 NOTE — Telephone Encounter (Signed)
Caller name: [name] Call back number: [phone]  [name] states her father dementia is getting worse. She is wondering if she should up his medication

## 2020-10-02 ENCOUNTER — Other Ambulatory Visit: Payer: Medicare Other

## 2020-10-02 ENCOUNTER — Other Ambulatory Visit: Payer: Self-pay

## 2020-10-02 DIAGNOSIS — R399 Unspecified symptoms and signs involving the genitourinary system: Secondary | ICD-10-CM

## 2020-10-02 DIAGNOSIS — R410 Disorientation, unspecified: Secondary | ICD-10-CM

## 2020-10-02 NOTE — Telephone Encounter (Signed)
Please advise 

## 2020-10-02 NOTE — Telephone Encounter (Signed)
We need to get a UA, urine culture before antibiotics, please arrange.  DX UTI. If he has severe symptoms or fever and chills needs to go to the hospital.

## 2020-10-02 NOTE — Telephone Encounter (Signed)
Spoke w/ Pt's daughter, [name], she will drop off urine either this afternoon or tomorrow morning.

## 2020-10-02 NOTE — Telephone Encounter (Signed)
Daughter calling back stating his urine is cloudy,confusion. [name] states he is displaying the same symptoms as when he had a UTI in the past. She is wondering if you prescribe an antibiotic.

## 2020-10-03 ENCOUNTER — Ambulatory Visit: Payer: Medicare Other

## 2020-10-03 ENCOUNTER — Other Ambulatory Visit: Payer: Medicare Other

## 2020-10-05 ENCOUNTER — Telehealth: Payer: Self-pay

## 2020-10-05 LAB — URINALYSIS, ROUTINE W REFLEX MICROSCOPIC
Bilirubin Urine: NEGATIVE
Glucose, UA: NEGATIVE
Hgb urine dipstick: NEGATIVE
Hyaline Cast: NONE SEEN /LPF
Ketones, ur: NEGATIVE
Leukocytes,Ua: NEGATIVE
Nitrite: POSITIVE — AB
Protein, ur: NEGATIVE
RBC / HPF: NONE SEEN /HPF (ref 0–2)
Specific Gravity, Urine: 1.016 (ref 1.001–1.03)
Squamous Epithelial / HPF: NONE SEEN /HPF (ref <=5)
WBC, UA: NONE SEEN /HPF (ref 0–5)
pH: <=5 (ref 5.0–8.0)

## 2020-10-05 LAB — URINE CULTURE
MICRO NUMBER:: 11066790
SPECIMEN QUALITY:: ADEQUATE

## 2020-10-05 NOTE — Telephone Encounter (Signed)
Plan of care signed and faxed to [facility] at [phone]. Form sent for scanning.  

## 2020-10-06 ENCOUNTER — Other Ambulatory Visit: Payer: Self-pay | Admitting: Internal Medicine

## 2020-10-06 MED ORDER — CIPROFLOXACIN HCL 500 MG PO TABS: 500.0000 mg | ORAL_TABLET | Freq: Two times a day (BID) | ORAL | 0 refills | Status: DC

## 2020-10-10 ENCOUNTER — Other Ambulatory Visit: Payer: Self-pay

## 2020-10-10 ENCOUNTER — Ambulatory Visit: Payer: Medicare Other | Admitting: Podiatry

## 2020-10-10 DIAGNOSIS — M79675 Pain in left toe(s): Secondary | ICD-10-CM | POA: Diagnosis not present

## 2020-10-10 DIAGNOSIS — L600 Ingrowing nail: Secondary | ICD-10-CM | POA: Diagnosis not present

## 2020-10-10 DIAGNOSIS — B351 Tinea unguium: Secondary | ICD-10-CM

## 2020-10-10 DIAGNOSIS — M79674 Pain in right toe(s): Secondary | ICD-10-CM

## 2020-10-10 DIAGNOSIS — I739 Peripheral vascular disease, unspecified: Secondary | ICD-10-CM

## 2020-10-11 ENCOUNTER — Encounter: Payer: Self-pay | Admitting: Podiatry

## 2020-10-11 NOTE — Progress Notes (Signed)
  Subjective:  Patient ID: Lonnie [name], male    DOB: [birth year],  MRN: [id number]  Chief Complaint  Patient presents with  . routine foot care    nail trim    69 y.o. male returns for the above complaint.  Patient presents with thickened elongated dystrophic toenails x10.  Patient states that they are painful to touch.  Patient would like to have them debrided down.  His wound on the outside has completely help and has resolved.  He denies any other acute complaints.  He states that he also has pain for some ingrown toenails for which she does not want to have a procedure done but would like to have it removed completely with just a nail treatment.  He states is painful to walk with.  He has had it for a long period of time.  He denies any other acute complaints.  Objective:  There were no vitals filed for this visit. Podiatric Exam: Vascular: dorsalis pedis and posterior tibial pulses are palpable bilateral. Capillary return is immediate. Temperature gradient is WNL. Skin turgor WNL  Sensorium: Normal Semmes Weinstein monofilament test. Normal tactile sensation bilaterally. Nail Exam: Pt has thick disfigured discolored nails with subungual debris noted bilateral entire nail hallux through fifth toenails.  Pain on palpation to the nails. Ulcer Exam: There is no evidence of ulcer or pre-ulcerative changes or infection. Orthopedic Exam: Muscle tone and strength are WNL. No limitations in general ROM. No crepitus or effusions noted. HAV  B/L.  Hammer toes 2-5  B/L.  Pain on palpation to the medial lateral border of the right hallux.  No clinical signs of clinical signs of infection noted. Skin: No Porokeratosis. No infection or ulcers    Assessment & Plan:  No diagnosis found.  Patient was evaluated and treated and all questions answered.  Right ingrown toenail pain. -I explained to the patient the etiology of ingrown and the genetic nature of this associated with it and various  treatment options were discussed.  Ultimately patient would like to hold off on an ingrown toenail I given she has history of past ulceration I would like to hold off as well.  At this time I will just perform a slant back procedure to help with the pain.  Patient states understanding  Onychomycosis with pain  -Nails palliatively debrided as below. -Educated on self-care  Procedure: Nail Debridement Rationale: pain  Type of Debridement: manual, sharp debridement. Instrumentation: Nail nipper, rotary burr. Number of Nails: 10  Procedures and Treatment: Consent by patient was obtained for treatment procedures. The patient understood the discussion of treatment and procedures well. All questions were answered thoroughly reviewed. Debridement of mycotic and hypertrophic toenails, 1 through 5 bilateral and clearing of subungual debris. No ulceration, no infection noted.  Return Visit-Office Procedure: Patient instructed to return to the office for a follow up visit 3 months for continued evaluation and treatment.  Nicholes Rough, DPM    No follow-ups on file.

## 2020-10-20 ENCOUNTER — Other Ambulatory Visit: Payer: Self-pay | Admitting: Family Medicine

## 2020-10-20 DIAGNOSIS — M25473 Effusion, unspecified ankle: Secondary | ICD-10-CM

## 2020-10-26 ENCOUNTER — Telehealth: Payer: Self-pay | Admitting: Internal Medicine

## 2020-10-26 NOTE — Telephone Encounter (Signed)
Yes, I can refill Plavix if needed.

## 2020-10-26 NOTE — Telephone Encounter (Signed)
Please advise 

## 2020-10-26 NOTE — Telephone Encounter (Signed)
Caller name: Larita Fife ( [facility]) Call back number: [phone] ext [phone]  Larita Fife wants to know if Dr. Drue Novel is taking over clopidogrel (Plavix)

## 2020-10-26 NOTE — Telephone Encounter (Signed)
Spoke w/ Larita Fife- she figured out that Dr. Kirke Corin was cardiologist.

## 2020-10-29 ENCOUNTER — Other Ambulatory Visit (INDEPENDENT_AMBULATORY_CARE_PROVIDER_SITE_OTHER): Payer: Medicare Other

## 2020-10-29 ENCOUNTER — Telehealth: Payer: Self-pay | Admitting: Internal Medicine

## 2020-10-29 ENCOUNTER — Telehealth: Payer: Self-pay | Admitting: *Deleted

## 2020-10-29 ENCOUNTER — Other Ambulatory Visit: Payer: Self-pay

## 2020-10-29 DIAGNOSIS — R399 Unspecified symptoms and signs involving the genitourinary system: Secondary | ICD-10-CM

## 2020-10-29 LAB — URINALYSIS, ROUTINE W REFLEX MICROSCOPIC
Bilirubin Urine: NEGATIVE
Hgb urine dipstick: NEGATIVE
Ketones, ur: NEGATIVE
Leukocytes,Ua: NEGATIVE
Nitrite: NEGATIVE
RBC / HPF: NONE SEEN (ref 0–?)
Specific Gravity, Urine: 1.025 (ref 1.000–1.030)
Total Protein, Urine: NEGATIVE
Urine Glucose: NEGATIVE
Urobilinogen, UA: 0.2 (ref 0.0–1.0)
pH: 6.5 (ref 5.0–8.0)

## 2020-10-29 NOTE — Telephone Encounter (Signed)
Spoke w/ Pt's daughter [name]- she will come by to pick up cup for urine sample, she wanted to know if you would be willing to send in an antibiotic sooner since urine cultures take several days and Pt is "not himself." She will be by today to get the urine cup to drop off.

## 2020-10-29 NOTE — Telephone Encounter (Signed)
   [facility] Pre-operative Risk Assessment    HEARTCARE STAFF: - Please ensure there is not already an duplicate clearance open for this procedure. - Under Visit Info/Reason for Call, type in Other and utilize the format Clearance MM/DD/YY or Clearance TBD. Do not use dashes or single digits. - If request is for dental extraction, please clarify the # of teeth to be extracted.  Request for surgical clearance:  1. What type of surgery is being performed? Lumbar Epidural Steroid Injection   2. When is this surgery scheduled? 11/09/2020   3. What type of clearance is required (medical clearance vs. Pharmacy clearance to hold med vs. Both)? Medical  4. Are there any medications that need to be held prior to surgery and how long?plavix for 5 days   5. Practice name and name of physician performing surgery? [facility]    6. What is the office phone number? [phone] ext [phone]   7.   What is the office fax number? [phone]  8.   Anesthesia type (None, local, MAC, general) ? Georgiann Hahn 10/29/2020, 11:26 AM  _________________________________________________________________   (provider comments below)

## 2020-10-29 NOTE — Telephone Encounter (Signed)
   Primary Cardiologist: Verne Carrow, MD  Chart reviewed as part of pre-operative protocol coverage.  Patient has a history of CAD s/p CABG in 2014 with no recent ischemic evaluation. More recently has PAD s/p R Fem-pop 12/2019. Will ask requesting surgeons office to contact Dr. Myra Gianotti for input on plavix from a vascular standpoint.  Dr. Clifton James, any objections to holding plavix from a cardiac standpoint prior to his spinal injection? Please route your response back to P CV DIV PREOP.   Thank you!  Beatriz Stallion, PA-C 10/29/2020, 1:25 PM

## 2020-10-29 NOTE — Telephone Encounter (Signed)
Urine culture showed E. coli 3 weeks ago, he was treated with Cipro for 2 weeks, recommend a UA urine culture ASAP, DX UTI. If urine culture positive, he will be treated and will consider daily antibiotics for suppression.

## 2020-10-29 NOTE — Telephone Encounter (Signed)
Please advise 

## 2020-10-29 NOTE — Telephone Encounter (Signed)
Patient states urinary complaint, on going UTI and would like medication sent to his pharmacy.

## 2020-10-30 NOTE — Telephone Encounter (Signed)
Advise daughter: Preliminary urine test negative, waiting for the urine culture. If urine culture is negative, he won't need antibiotics.

## 2020-10-30 NOTE — Telephone Encounter (Signed)
Patient daughter called in reference to lab results, patient's daughter [name] is inquiring about antibiotic.   Please advise

## 2020-10-30 NOTE — Telephone Encounter (Signed)
No objections to holding Plavix from a cardiac standpoint. Earney Hamburg

## 2020-10-30 NOTE — Telephone Encounter (Signed)
   Primary Cardiologist: Verne Carrow, MD  Chart reviewed as part of pre-operative protocol coverage.  Left a voicemail for patient to call back for ongoing preop assessment.  Beatriz Stallion, PA-C 10/30/2020, 10:53 AM

## 2020-10-30 NOTE — Telephone Encounter (Signed)
Spoke w/ [name]- she is frustrated that the urine is not showing an infection even though he has a strong odor in urine, and the color of the urine is off. She states she guesses she will have to take him to the hospital for treatment but is tired and wants something else done if this keeps happening every 6 weeks. I informed I'd let PCP know about her frustrations. She thanked me for call.

## 2020-10-31 LAB — URINE CULTURE
MICRO NUMBER:: 11173323
SPECIMEN QUALITY:: ADEQUATE

## 2020-10-31 MED ORDER — AMOXICILLIN-POT CLAVULANATE 875-125 MG PO TABS: 1.0000 | ORAL_TABLET | Freq: Two times a day (BID) | ORAL | 0 refills | Status: DC

## 2020-10-31 NOTE — Telephone Encounter (Signed)
See lab results.  

## 2020-10-31 NOTE — Addendum Note (Signed)
Addended byConrad Olinda D on: 10/31/2020 04:26 PM   Modules accepted: Orders

## 2020-11-04 ENCOUNTER — Ambulatory Visit: Payer: Medicare Other | Attending: Internal Medicine

## 2020-11-04 DIAGNOSIS — Z23 Encounter for immunization: Secondary | ICD-10-CM

## 2020-11-04 NOTE — Progress Notes (Signed)
   Covid-19 Vaccination Clinic  Name:  ELPIDIO THIELEN    MRN: [id number] DOB: 1951-02-16  11/04/2020  Mr. Spadoni was observed post Covid-19 immunization for 15 minutes without incident. He was provided with Vaccine Information Sheet and instruction to access the V-Safe system.   Mr. Binsfeld was instructed to call 911 with any severe reactions post vaccine: Marland Kitchen Difficulty breathing  . Swelling of face and throat  . A fast heartbeat  . A bad rash all over body  . Dizziness and weakness   Immunizations Administered    Name Date Dose VIS Date Route   Pfizer COVID-19 Vaccine 11/04/2020  1:36 PM 0.3 mL 10/10/2020 Intramuscular   Manufacturer: [employer], [employer]   Lot: [id number]   NDC: [address]-0

## 2020-11-06 NOTE — Telephone Encounter (Signed)
Unable to reach the patient.   I will route this recommendation to the requesting party via Epic fax function and remove from pre-op pool.  Please call with questions.  [place], [place] 11/06/2020, 8:59 AM

## 2020-11-07 ENCOUNTER — Telehealth: Payer: Self-pay | Admitting: Internal Medicine

## 2020-11-08 NOTE — Telephone Encounter (Signed)
Requesting: clonazepam 0.25mg  Contract: None UDS: None Last Visit: 09/26/2020 w/ Dr. Patsy Lager Next Visit: None scheduled  Last Refill: 07/17/2020 #180 and 0RF Pt sig: 1 tab bid prn  Please Advise   Please advise on citalopram dosage. Is he supposed to be taking one tablet daily or 2?

## 2020-11-08 NOTE — Telephone Encounter (Signed)
PDMP okay, prescription sent (take citalopram 20 mg: Half tablet twice a day

## 2020-11-23 ENCOUNTER — Other Ambulatory Visit: Payer: Self-pay | Admitting: Internal Medicine

## 2020-11-23 DIAGNOSIS — M25473 Effusion, unspecified ankle: Secondary | ICD-10-CM

## 2020-12-07 ENCOUNTER — Other Ambulatory Visit: Payer: Self-pay | Admitting: Internal Medicine

## 2020-12-07 DIAGNOSIS — M25473 Effusion, unspecified ankle: Secondary | ICD-10-CM

## 2020-12-18 ENCOUNTER — Other Ambulatory Visit: Payer: Self-pay | Admitting: Internal Medicine

## 2020-12-19 ENCOUNTER — Telehealth: Payer: Self-pay | Admitting: Internal Medicine

## 2020-12-19 MED ORDER — CLONAZEPAM 0.25 MG PO TBDP: 0.2500 mg | ORAL_TABLET | Freq: Two times a day (BID) | ORAL | 1 refills | Status: DC | PRN

## 2020-12-19 NOTE — Telephone Encounter (Signed)
Daughter states they never got medication through [facility]. [facility] was on back order so [facility]. [facility].  clonazePAM (KLONOPIN) 0.25 MG disintegrating tablet [[id number]]    [id number] [place], [place] - [address]  [address] [place] [address]  Phone:  [phone] Fax:  [phone]

## 2020-12-19 NOTE — Telephone Encounter (Signed)
Please advise. [facility]?

## 2020-12-19 NOTE — Telephone Encounter (Signed)
PDMP okay, [facility] local [facility]

## 2021-01-11 ENCOUNTER — Ambulatory Visit: Payer: Medicare Other | Admitting: Podiatry

## 2021-01-18 ENCOUNTER — Other Ambulatory Visit: Payer: Self-pay

## 2021-01-18 ENCOUNTER — Ambulatory Visit: Payer: Medicare Other | Admitting: Podiatry

## 2021-01-18 DIAGNOSIS — B351 Tinea unguium: Secondary | ICD-10-CM

## 2021-01-18 DIAGNOSIS — I739 Peripheral vascular disease, unspecified: Secondary | ICD-10-CM | POA: Diagnosis not present

## 2021-01-18 DIAGNOSIS — M79675 Pain in left toe(s): Secondary | ICD-10-CM

## 2021-01-18 DIAGNOSIS — S90111A Contusion of right great toe without damage to nail, initial encounter: Secondary | ICD-10-CM

## 2021-01-18 DIAGNOSIS — M79674 Pain in right toe(s): Secondary | ICD-10-CM | POA: Diagnosis not present

## 2021-01-22 ENCOUNTER — Telehealth: Payer: Self-pay | Admitting: Internal Medicine

## 2021-01-22 ENCOUNTER — Encounter: Payer: Self-pay | Admitting: Podiatry

## 2021-01-22 DIAGNOSIS — N390 Urinary tract infection, site not specified: Secondary | ICD-10-CM

## 2021-01-22 NOTE — Telephone Encounter (Signed)
Please advise. Pt is overdue for visit

## 2021-01-22 NOTE — Progress Notes (Signed)
  Subjective:  Patient ID: Lonnie [name], male    DOB: 1951-06-17,  MRN: [id number]  Chief Complaint  Patient presents with  . routine foot care    Nail trim    70 y.o. male returns for the above complaint.  Patient presents with thickened elongated dystrophic toenails x10.  Patient states that they are painful to touch.  Patient would like to have them debrided down.  He is not able to do it himself.  He has secondary complaint of right hallux mild contusion.  Patient states he may have hit it or shoe might have rubbed against a does not recall exactly how it happened.  Is mild pain associated with it.  No bleeding or ulceration present. Objective:  There were no vitals filed for this visit. Podiatric Exam: Vascular: dorsalis pedis and posterior tibial pulses are palpable bilateral. Capillary return is immediate. Temperature gradient is WNL. Skin turgor WNL  Sensorium: Normal Semmes Weinstein monofilament test. Normal tactile sensation bilaterally. Nail Exam: Pt has thick disfigured discolored nails with subungual debris noted bilateral entire nail hallux through fifth toenails.  Pain on palpation to the nails. Ulcer Exam: There is no evidence of ulcer or pre-ulcerative changes or infection. Orthopedic Exam: Muscle tone and strength are WNL. No limitations in general ROM. No crepitus or effusions noted. HAV  B/L.  Hammer toes 2-5  B/L.  Pain on palpation to the medial lateral border of the right hallux.  No clinical signs of clinical signs of infection noted. Skin: No Porokeratosis. No infection or ulcers mild contusion noted to the right hallux.  No clinical signs of infection noted no damage to the nail noted.  Mild pain on palpation.    Assessment & Plan:   1. Contusion of right great toe without damage to nail, initial encounter   2. Pain due to onychomycosis of toenails of both feet   3. Peripheral vascular disease (HCC)     Patient was evaluated and treated and all questions  answered.  Right hallux nail mild contusion -I explained to the patient the etiology of contusion various treatment options were discussed.  Given that there is no damage done to the nail I believe patient will benefit from just protecting it.  He was given gel protectors to take the pressure off of the toe.  This is likely due to shoes constantly rubbing against it.  I discussed shoe gear modification with him in extensive detail.  He states understanding.  Onychomycosis with pain  -Nails palliatively debrided as below. -Educated on self-care  Procedure: Nail Debridement Rationale: pain  Type of Debridement: manual, sharp debridement. Instrumentation: Nail nipper, rotary burr. Number of Nails: 10  Procedures and Treatment: Consent by patient was obtained for treatment procedures. The patient understood the discussion of treatment and procedures well. All questions were answered thoroughly reviewed. Debridement of mycotic and hypertrophic toenails, 1 through 5 bilateral and clearing of subungual debris. No ulceration, no infection noted.  Return Visit-Office Procedure: Patient instructed to return to the office for a follow up visit 3 months for continued evaluation and treatment.  Nicholes Rough, DPM    No follow-ups on file.

## 2021-01-22 NOTE — Telephone Encounter (Signed)
Ask about symptoms. If symptoms are severe, he has fever chills: Needs to be seen in person. Otherwise okay to provide the daughter with sterile cup and bring sample. UA urine culture.  DX UTI Additionally, due for office visit, okay to schedule virtually

## 2021-01-22 NOTE — Telephone Encounter (Signed)
Daughter calling stating the patient has uti. She states normally you would give her a specimen cup. She is wondering if she could stop by to pick up cup today

## 2021-01-23 ENCOUNTER — Other Ambulatory Visit: Payer: Self-pay

## 2021-01-23 ENCOUNTER — Other Ambulatory Visit (INDEPENDENT_AMBULATORY_CARE_PROVIDER_SITE_OTHER): Payer: Medicare Other

## 2021-01-23 DIAGNOSIS — N390 Urinary tract infection, site not specified: Secondary | ICD-10-CM

## 2021-01-23 NOTE — Telephone Encounter (Signed)
Noted  

## 2021-01-23 NOTE — Telephone Encounter (Signed)
Spoke w/ Lonnie [name]- she informed Pt having a little more confusion lately. Wonders if he has a UTI. She gave him 2 left over clindamycins yesterday (I informed this would cause an inaccurate result for the urinalysis and urine culture. However I'd still place the cup at the front for her to pick up. She will schedule a virtual visit w/ the front when she comes to pick it up later today.

## 2021-01-24 LAB — URINALYSIS, ROUTINE W REFLEX MICROSCOPIC
Bilirubin Urine: NEGATIVE
Hgb urine dipstick: NEGATIVE
Ketones, ur: NEGATIVE
Leukocytes,Ua: NEGATIVE
Nitrite: NEGATIVE
RBC / HPF: NONE SEEN (ref 0–?)
Specific Gravity, Urine: 1.025 (ref 1.000–1.030)
Total Protein, Urine: NEGATIVE
Urine Glucose: NEGATIVE
Urobilinogen, UA: 0.2 (ref 0.0–1.0)
pH: 6 (ref 5.0–8.0)

## 2021-01-24 LAB — URINE CULTURE
MICRO NUMBER:: 11486763
Result:: NO GROWTH
SPECIMEN QUALITY:: ADEQUATE

## 2021-01-25 ENCOUNTER — Telehealth (INDEPENDENT_AMBULATORY_CARE_PROVIDER_SITE_OTHER): Payer: Medicare Other | Admitting: Internal Medicine

## 2021-01-25 ENCOUNTER — Encounter: Payer: Self-pay | Admitting: Internal Medicine

## 2021-01-25 ENCOUNTER — Other Ambulatory Visit: Payer: Self-pay

## 2021-01-25 VITALS — Ht 74.0 in | Wt 205.0 lb

## 2021-01-25 DIAGNOSIS — R509 Fever, unspecified: Secondary | ICD-10-CM

## 2021-01-25 NOTE — Progress Notes (Signed)
Subjective:    Patient ID: Lonnie [name], male    DOB: Apr 15, 1951, 70 y.o.   MRN: [id number]  DOS:  01/25/2021 Type of visit - description: Virtual Visit via Video Note  I connected with the above patient  by a video enabled telemedicine application and verified that I am speaking with the correct person using two identifiers.   THIS ENCOUNTER IS A VIRTUAL VISIT DUE TO COVID-19 - PATIENT WAS NOT SEEN IN THE OFFICE. PATIENT HAS CONSENTED TO VIRTUAL VISIT / TELEMEDICINE VISIT   Location of patient: home  Location of provider: office  Persons participating in the virtual visit: patient, provider   I discussed the limitations of evaluation and management by telemedicine and the availability of in person appointments. The patient expressed understanding and agreed to proceed.  Acute In the last few days, Lonnie [name], his daughter, noticed some increased confusion, he was less interactive, decreased p.o. intake. She suspected a UTI, and requested a UA urine culture, unfortunately the patient was given clindamycin x2 before the urine was collected.  She also had a low-grade fever for 2 days, no fever yesterday. Cough is at baseline No nausea, vomiting, diarrhea No unusual neurological symptoms specifically speech is at baseline, face is at baseline.  I have the chance to talk with the patient and he told me that he was doing okay with no stomach pain or other problems. Perhaps some dysuria?.   Review of Systems See above   Past Medical History:  Diagnosis Date  . Allergy   . Anxiety   . Arthritis    left neck, shoulder, knee  . CAD (coronary artery disease)    a. anterior STEMI 10/2013 s/p 4V CABG with LIMA to mid LAD, SVG to OM, SVG to PDA, SVG to Diagonal.  . Chronic combined systolic and diastolic CHF (congestive heart failure) (HCC)   . Dementia (HCC)   . Depression   . Falls   . Hypertension   . Ischemic cardiomyopathy    a. EF 40-45% at time of CABG and in 2018.  .  MI (myocardial infarction) (HCC)   . Peripheral neuropathy   . Prediabetes 09/01/2014  . PVD (peripheral vascular disease) (HCC)    a. s/p L SFA stents with now known bilateral SFA. b. right femoral to below the knee bypass by Dr. Myra Gianotti in 12/2019, c/b infection 02/2020.  . Stroke [facility])    seen on CT Scan  . Subdural hematoma (HCC)   . Tobacco abuse   . UTI (urinary tract infection)     Past Surgical History:  Procedure Laterality Date  . ABDOMINAL AORTAGRAM N/A 06/07/2014   Procedure: ABDOMINAL Ronny Flurry;  Surgeon: Iran Ouch, MD;  Location: [facility] CATH LAB;  Service: Cardiovascular;  Laterality: N/A;  . ABDOMINAL AORTAGRAM N/A 03/07/2015   Procedure: ABDOMINAL Ronny Flurry;  Surgeon: Iran Ouch, MD;  Location: [facility] CATH LAB;  Service: Cardiovascular;  Laterality: N/A;  . ABDOMINAL AORTOGRAM W/LOWER EXTREMITY N/A 01/18/2020   Procedure: ABDOMINAL AORTOGRAM W/LOWER EXTREMITY - Right;  Surgeon: Iran Ouch, MD;  Location: [facility] INVASIVE CV LAB;  Service: Cardiovascular;  Laterality: N/A;  . APPENDECTOMY    . COLONOSCOPY WITH PROPOFOL N/A 10/11/2015   Procedure: COLONOSCOPY WITH PROPOFOL;  Surgeon: Charna Elizabeth, MD;  Location: [facility] ENDOSCOPY;  Service: Endoscopy;  Laterality: N/A;  . CORONARY ARTERY BYPASS GRAFT N/A 11/04/2013   Procedure: CORONARY ARTERY BYPASS GRAFTING (CABG) TIMES FOUR  USING LEFT INTERNAL MAMMARY ARTERY AND RIGHT AND LEFT SAPHENOUS LEG  VEIN HARVESTED ENDOSCOPICALLY;  Surgeon: Loreli Slot, MD;  Location: [facility] OR;  Service: Open Heart Surgery;  Laterality: N/A;  . CRANIOTOMY Left 06/30/2019   Procedure: Frontal CRANIOTOMY HEMATOMA EVACUATION SUBDURAL;  Surgeon: Lisbeth Renshaw, MD;  Location: [facility] OR;  Service: Neurosurgery;  Laterality: Left;  Frontal CRANIOTOMY HEMATOMA EVACUATION SUBDURAL  . FEMORAL-POPLITEAL BYPASS GRAFT Right 01/20/2020   Procedure: BYPASS GRAFT FEMORAL-POPLITEAL ARTERY RIGHT LEG USING GORE PROPATEN GRAFT;  Surgeon: Nada Libman, MD;   Location: [facility] OR;  Service: Vascular;  Laterality: Right;  . FINGER SURGERY  2017   injury  . I & D EXTREMITY Right 03/02/2020   Procedure: IRRIGATION AND DEBRIDEMENT EXTREMITY right  lower leg  with Antibiotic beads.;  Surgeon: Chuck Hint, MD;  Location: [facility] OR;  Service: Vascular;  Laterality: Right;  . KNEE SURGERY     fractured patella  . LEFT HEART CATH N/A 10/29/2013   Procedure: LEFT HEART CATH;  Surgeon: Kathleene Hazel, MD;  Location: [facility] CATH LAB;  Service: Cardiovascular;  Laterality: N/A;  . LEFT HEART CATHETERIZATION WITH CORONARY ANGIOGRAM N/A 10/31/2013   Procedure: LEFT HEART CATHETERIZATION WITH CORONARY ANGIOGRAM;  Surgeon: Kathleene Hazel, MD;  Location: [facility] CATH LAB;  Service: Cardiovascular;  Laterality: N/A;  . MOUTH SURGERY    . TOE SURGERY      Allergies as of 01/25/2021      Reactions   Ace Inhibitors Swelling   Angioedema   Dairy Aid [lactase]    gas   Eggs Or Egg-derived Products    Cannot eat Prepared Eggs   Latex Itching      Medication List       Accurate as of January 25, 2021 11:59 PM. If you have any questions, ask your nurse or doctor.        STOP taking these medications   amoxicillin-clavulanate 875-125 MG tablet Commonly known as: AUGMENTIN Stopped by: Willow Ora, MD   ciprofloxacin 500 MG tablet Commonly known as: CIPRO Stopped by: Willow Ora, MD     TAKE these medications   acetaminophen 325 MG tablet Commonly known as: TYLENOL Take 2 tablets (650 mg total) by mouth 4 (four) times daily -  before meals and at bedtime. What changed: when to take this   aspirin EC 81 MG tablet Take 81 mg by mouth daily.   atorvastatin 80 MG tablet Commonly known as: LIPITOR Take 1 tablet (80 mg total) by mouth daily.   BEN GAY EX Apply 1 application topically as needed (typically right knee).   Calcium Carb-Cholecalciferol 600-800 MG-UNIT Tabs Take 1 tablet by mouth daily.   citalopram 20 MG tablet Commonly known as:  CELEXA Take 0.5 tablets (10 mg total) by mouth 2 (two) times daily.   clonazePAM 0.25 MG disintegrating tablet Commonly known as: KLONOPIN Take 1 tablet (0.25 mg total) by mouth 2 (two) times daily as needed.   clopidogrel 75 MG tablet Commonly known as: PLAVIX Take 1 tablet (75 mg total) by mouth daily.   docusate sodium 100 MG capsule Commonly known as: COLACE Take 100 mg by mouth daily.   fluticasone 50 MCG/ACT nasal spray Commonly known as: FLONASE Place 2 sprays into both nostrils daily.   furosemide 20 MG tablet Commonly known as: LASIX Take 1 tablet (20 mg total) by mouth daily as needed (ankle swelling).   gabapentin 100 MG capsule Commonly known as: NEURONTIN Take 1 capsule (100 mg total) by mouth 2 (two) times daily.   irbesartan 300 MG  tablet Commonly known as: AVAPRO Take 1 tablet (300 mg total) by mouth daily.   melatonin 3 MG Tabs tablet Take 6 mg by mouth at bedtime.   metoprolol tartrate 25 MG tablet Commonly known as: LOPRESSOR Take 1 tablet (25 mg total) by mouth 2 (two) times daily.   multivitamin with minerals Tabs tablet Take 1 tablet by mouth daily.   nicotine 7 mg/24hr patch Commonly known as: NICODERM CQ - dosed in mg/24 hr Place 1 patch (7 mg total) onto the skin daily.   pantoprazole 40 MG tablet Commonly known as: PROTONIX Take 1 tablet (40 mg total) by mouth at bedtime.   QUEtiapine 200 MG tablet Commonly known as: SEROQUEL Take 1 tablet (200 mg total) by mouth 2 (two) times daily.   tamsulosin 0.4 MG Caps capsule Commonly known as: FLOMAX Take 2 capsules (0.8 mg total) by mouth daily after supper.   traMADol 50 MG tablet Commonly known as: ULTRAM Take 50 mg by mouth daily.          Objective:   Physical Exam Ht 6\' 2"  (1.88 m)   Wt 205 lb (93 kg)   BMI 26.32 kg/m  This is a video visit, I saw , I saw Nate, Common was sitting in a chair, in no distress.  He answer questions appropriately.  He looked  comfortable.     Assessment     Assessment Prediabetes  dx 08-2014 HTN Hyperlipidemia Psych Anxiety: per pt, see OV 09/10/2019 Frontotemporal dementia dx 06/2019 but sx started gradually long before  CV: --CAD, MI , cath 10-2013 ---> CABG --Ischemic cardiomyopathy --Peripheral vascular disease Tobacco abuse -- intolerant to chantix before, tried Wellbutrin 2016 (not much help) Onychomycosis-- rx lamisil per podiatry 12-2015 Subdural hematoma, s/p craniotomy: admited 06/2019     PLAN: Fever: Had mild confusion in the last few days, low-grade fever that broke yesterday. UA and urine culture negative (noting he got clindamycin x2 before the collection) Overall according to the daughter and the patient himself things are better. Recommend to push fluids, continue same routine medicines, advised to be checked for Covid and let me know the results. Also advised to avoid antibiotics without previous documentation of infection if at all possible.     I discussed the assessment and treatment plan with the patient. The patient was provided an opportunity to ask questions and all were answered. The patient agreed with the plan and demonstrated an understanding of the instructions.   The patient was advised to call back or seek an in-person evaluation if the symptoms worsen or if the condition fails to improve as anticipated.

## 2021-01-25 NOTE — Progress Notes (Signed)
Pre visit review using our clinic review tool, if applicable. No additional management support is needed unless otherwise documented below in the visit note. 

## 2021-01-27 NOTE — Assessment & Plan Note (Signed)
Fever: Had mild confusion in the last few days, low-grade fever that broke yesterday. UA and urine culture negative (noting he got clindamycin x2 before the collection) Overall according to the daughter and the patient himself things are better. Recommend to push fluids, continue same routine medicines, advised to be checked for Covid and let me know the results. Also advised to avoid antibiotics without previous documentation of infection if at all possible.

## 2021-02-05 ENCOUNTER — Other Ambulatory Visit: Payer: Self-pay

## 2021-02-05 MED ORDER — CLOPIDOGREL BISULFATE 75 MG PO TABS: 75.0000 mg | ORAL_TABLET | Freq: Every day | ORAL | 3 refills | Status: DC

## 2021-02-06 ENCOUNTER — Other Ambulatory Visit: Payer: Self-pay | Admitting: Internal Medicine

## 2021-02-08 ENCOUNTER — Telehealth: Payer: Self-pay | Admitting: Internal Medicine

## 2021-02-08 MED ORDER — CITALOPRAM HYDROBROMIDE 20 MG PO TABS: ORAL_TABLET | ORAL | 0 refills | Status: DC

## 2021-02-08 NOTE — Telephone Encounter (Signed)
Any advice or recommended med changes before we call?

## 2021-02-08 NOTE — Telephone Encounter (Signed)
Patient daughter called would like a call back from Dr.Paz she states that patient  Lonnie [name] has gotten worse he has become mean and agitated she would like to discuss medicine change or dosage

## 2021-02-08 NOTE — Telephone Encounter (Signed)
1.  Arrange a neurology referral : Dementia with behavioral issues 2.  Increase citalopram 20 mg gradually: 1.5 tablets for 1 week (30 mg), then 2 tablets (40 mg). 3.  Watch for things that could cause more aggressiveness: Constipation, fever, pain, difficulty urinating.  If any of that is happening needs to be seen in person.

## 2021-02-08 NOTE — Telephone Encounter (Signed)
Spoke w/ [name]- informed of recommendations. She agreed to increase citalopram (Rx sent to [facility]) but that Pt is adament that he won't go and see other providers. She informed that her parents recently moved and feels that he is having worsening dementia since then.

## 2021-03-11 ENCOUNTER — Other Ambulatory Visit: Payer: Self-pay | Admitting: Internal Medicine

## 2021-03-11 ENCOUNTER — Telehealth (INDEPENDENT_AMBULATORY_CARE_PROVIDER_SITE_OTHER): Payer: Medicare Other | Admitting: Internal Medicine

## 2021-03-11 ENCOUNTER — Encounter: Payer: Self-pay | Admitting: Internal Medicine

## 2021-03-11 ENCOUNTER — Other Ambulatory Visit: Payer: Self-pay

## 2021-03-11 VITALS — BP 131/81 | HR 59 | Temp 97.6°F

## 2021-03-11 DIAGNOSIS — M7989 Other specified soft tissue disorders: Secondary | ICD-10-CM | POA: Diagnosis not present

## 2021-03-11 DIAGNOSIS — M79604 Pain in right leg: Secondary | ICD-10-CM | POA: Diagnosis not present

## 2021-03-11 NOTE — Progress Notes (Signed)
Subjective:    Patient ID: Lonnie [name], male    DOB: 26-Jan-1951, 70 y.o.   MRN: [id number]  DOS:  03/11/2021 Type of visit - description: Virtual Visit via Video Note  I connected with the above patient  by a video enabled telemedicine application and verified that I am speaking with the correct person using two identifiers.   THIS ENCOUNTER IS A VIRTUAL VISIT DUE TO COVID-19 - PATIENT WAS NOT SEEN IN THE OFFICE. PATIENT HAS CONSENTED TO VIRTUAL VISIT / TELEMEDICINE VISIT   Location of patient: home  Location of provider: office  Persons participating in the virtual visit: patient, provider   I discussed the limitations of evaluation and management by telemedicine and the availability of in person appointments. The patient expressed understanding and agreed to proceed.  Acute The visit was set up by the daughter [name]. The family noted right leg swelling, from approximately 2 inches below the knee all the way to the toes. This started 10 days ago. Patient reports some pain in the area, worse with ambulation. There has been no fever chills. The area does not seem warm or red.  Other issues: Mobility is getting worse, had 3 falls recently. He still smokes Dementia is a still about the same, he is very combative at times.   Review of Systems See above   Past Medical History:  Diagnosis Date  . Allergy   . Anxiety   . Arthritis    left neck, shoulder, knee  . CAD (coronary artery disease)    a. anterior STEMI 10/2013 s/p 4V CABG with LIMA to mid LAD, SVG to OM, SVG to PDA, SVG to Diagonal.  . Chronic combined systolic and diastolic CHF (congestive heart failure) (HCC)   . Dementia (HCC)   . Depression   . Falls   . Hypertension   . Ischemic cardiomyopathy    a. EF 40-45% at time of CABG and in 2018.  . MI (myocardial infarction) (HCC)   . Peripheral neuropathy   . Prediabetes 09/01/2014  . PVD (peripheral vascular disease) (HCC)    a. s/p L SFA stents with  now known bilateral SFA. b. right femoral to below the knee bypass by Dr. Myra Gianotti in 12/2019, c/b infection 02/2020.  . Stroke [facility])    seen on CT Scan  . Subdural hematoma (HCC)   . Tobacco abuse   . UTI (urinary tract infection)     Past Surgical History:  Procedure Laterality Date  . ABDOMINAL AORTAGRAM N/A 06/07/2014   Procedure: ABDOMINAL Ronny Flurry;  Surgeon: Iran Ouch, MD;  Location: [facility] CATH LAB;  Service: Cardiovascular;  Laterality: N/A;  . ABDOMINAL AORTAGRAM N/A 03/07/2015   Procedure: ABDOMINAL Ronny Flurry;  Surgeon: Iran Ouch, MD;  Location: [facility] CATH LAB;  Service: Cardiovascular;  Laterality: N/A;  . ABDOMINAL AORTOGRAM W/LOWER EXTREMITY N/A 01/18/2020   Procedure: ABDOMINAL AORTOGRAM W/LOWER EXTREMITY - Right;  Surgeon: Iran Ouch, MD;  Location: [facility] INVASIVE CV LAB;  Service: Cardiovascular;  Laterality: N/A;  . APPENDECTOMY    . COLONOSCOPY WITH PROPOFOL N/A 10/11/2015   Procedure: COLONOSCOPY WITH PROPOFOL;  Surgeon: Charna Elizabeth, MD;  Location: [facility] ENDOSCOPY;  Service: Endoscopy;  Laterality: N/A;  . CORONARY ARTERY BYPASS GRAFT N/A 11/04/2013   Procedure: CORONARY ARTERY BYPASS GRAFTING (CABG) TIMES FOUR  USING LEFT INTERNAL MAMMARY ARTERY AND RIGHT AND LEFT SAPHENOUS LEG VEIN HARVESTED ENDOSCOPICALLY;  Surgeon: Loreli Slot, MD;  Location: [facility] OR;  Service: Open Heart Surgery;  Laterality:  N/A;  . CRANIOTOMY Left 06/30/2019   Procedure: Frontal CRANIOTOMY HEMATOMA EVACUATION SUBDURAL;  Surgeon: Lisbeth Renshaw, MD;  Location: [facility] OR;  Service: Neurosurgery;  Laterality: Left;  Frontal CRANIOTOMY HEMATOMA EVACUATION SUBDURAL  . FEMORAL-POPLITEAL BYPASS GRAFT Right 01/20/2020   Procedure: BYPASS GRAFT FEMORAL-POPLITEAL ARTERY RIGHT LEG USING GORE PROPATEN GRAFT;  Surgeon: Nada Libman, MD;  Location: [facility] OR;  Service: Vascular;  Laterality: Right;  . FINGER SURGERY  2017   injury  . I & D EXTREMITY Right 03/02/2020   Procedure: IRRIGATION AND  DEBRIDEMENT EXTREMITY right  lower leg  with Antibiotic beads.;  Surgeon: Chuck Hint, MD;  Location: [facility] OR;  Service: Vascular;  Laterality: Right;  . KNEE SURGERY     fractured patella  . LEFT HEART CATH N/A 10/29/2013   Procedure: LEFT HEART CATH;  Surgeon: Kathleene Hazel, MD;  Location: [facility] CATH LAB;  Service: Cardiovascular;  Laterality: N/A;  . LEFT HEART CATHETERIZATION WITH CORONARY ANGIOGRAM N/A 10/31/2013   Procedure: LEFT HEART CATHETERIZATION WITH CORONARY ANGIOGRAM;  Surgeon: Kathleene Hazel, MD;  Location: [facility] CATH LAB;  Service: Cardiovascular;  Laterality: N/A;  . MOUTH SURGERY    . TOE SURGERY      Allergies as of 03/11/2021      Reactions   Ace Inhibitors Swelling   Angioedema   Dairy Aid [lactase]    gas   Eggs Or Egg-derived Products    Cannot eat Prepared Eggs   Latex Itching      Medication List       Accurate as of March 11, 2021  2:19 PM. If you have any questions, ask your nurse or doctor.        acetaminophen 325 MG tablet Commonly known as: TYLENOL Take 2 tablets (650 mg total) by mouth 4 (four) times daily -  before meals and at bedtime. What changed: when to take this   aspirin EC 81 MG tablet Take 81 mg by mouth daily.   atorvastatin 80 MG tablet Commonly known as: LIPITOR Take 1 tablet (80 mg total) by mouth daily.   BEN GAY EX Apply 1 application topically as needed (typically right knee).   Calcium Carb-Cholecalciferol 600-800 MG-UNIT Tabs Take 1 tablet by mouth daily.   citalopram 20 MG tablet Commonly known as: CELEXA Take 1.5 tablet by mouth daily for 1 week, then increase to 2 tablets daily   clonazePAM 0.25 MG disintegrating tablet Commonly known as: KLONOPIN Take 1 tablet (0.25 mg total) by mouth 2 (two) times daily as needed.   clopidogrel 75 MG tablet Commonly known as: PLAVIX Take 1 tablet (75 mg total) by mouth daily.   docusate sodium 100 MG capsule Commonly known as: COLACE Take 100 mg by  mouth daily.   fluticasone 50 MCG/ACT nasal spray Commonly known as: FLONASE Place 2 sprays into both nostrils daily.   furosemide 20 MG tablet Commonly known as: LASIX Take 1 tablet (20 mg total) by mouth daily as needed (ankle swelling).   gabapentin 100 MG capsule Commonly known as: NEURONTIN Take 1 capsule (100 mg total) by mouth 2 (two) times daily.   irbesartan 300 MG tablet Commonly known as: AVAPRO Take 1 tablet (300 mg total) by mouth daily.   melatonin 3 MG Tabs tablet Take 6 mg by mouth at bedtime.   metoprolol tartrate 25 MG tablet Commonly known as: LOPRESSOR Take 1 tablet (25 mg total) by mouth 2 (two) times daily.   multivitamin with minerals Tabs tablet Take  1 tablet by mouth daily.   nicotine 7 mg/24hr patch Commonly known as: NICODERM CQ - dosed in mg/24 hr Place 1 patch (7 mg total) onto the skin daily.   pantoprazole 40 MG tablet Commonly known as: PROTONIX Take 1 tablet (40 mg total) by mouth at bedtime.   QUEtiapine 200 MG tablet Commonly known as: SEROQUEL Take 1 tablet (200 mg total) by mouth 2 (two) times daily.   tamsulosin 0.4 MG Caps capsule Commonly known as: FLOMAX Take 2 capsules (0.8 mg total) by mouth daily after supper.   traMADol 50 MG tablet Commonly known as: ULTRAM Take 50 mg by mouth daily.          Objective:   Physical Exam BP 131/81 (BP Location: Right Arm, Patient Position: Sitting, Cuff Size: Large)   Pulse (!) 59   Temp 97.6 F (36.4 C)  This is a virtual video visit, he is alert, follows simple commands, speech is normal. Both legs were inspected through the video, he has indeed some swelling from the right knee down.  Otherwise the video quality was not good enough to do a more close inspection    Assessment     Assessment Prediabetes  dx 08-2014 HTN Hyperlipidemia Psych Anxiety: per pt, see OV 09/10/2019 Frontotemporal dementia dx 06/2019 but sx started gradually long before  CV: --CAD, MI , cath  10-2013 ---> CABG --Ischemic cardiomyopathy --Peripheral vascular disease Tobacco abuse -- intolerant to chantix before, tried Wellbutrin 2016 (not much help) Onychomycosis-- rx lamisil per podiatry 12-2015 Subdural hematoma, s/p craniotomy: admited 06/2019     PLAN: Acute (10-day) right leg pain and swelling: DDx is large and includes DVT, occult fractures in the setting of recent falls, cellulitis, even worsening PAD as pain is worse with ambulation. Advise daughter [name] to take him to the emergency room for a full evaluation, I am unable to assess his sxs via televisit. Most likely he needs a work-up. Dementia: According to the family is worse, he gets very angry and combative at times. Of note this, the last time he was seen in person was 04/2020 otherwise we have been doing video visits  I discussed the assessment and treatment plan with the patient. The patient was provided an opportunity to ask questions and all were answered. The patient agreed with the plan and demonstrated an understanding of the instructions.   The patient was advised to call back or seek an in-person evaluation if the symptoms worsen or if the condition fails to improve as anticipated.

## 2021-03-12 ENCOUNTER — Telehealth: Payer: Self-pay

## 2021-03-12 NOTE — Telephone Encounter (Signed)
Denies foot pain - discussed with PA - added on for ABIs and graft duplex - follow up on VWB day with PA

## 2021-03-12 NOTE — Assessment & Plan Note (Signed)
Acute (10-day) right leg pain and swelling: DDx is large and includes DVT, occult fractures in the setting of recent falls, cellulitis, even worsening PAD as pain is worse with ambulation. Advise daughter [name] to take him to the emergency room for a full evaluation, I am unable to assess his sxs via televisit. Most likely he needs a work-up. Dementia: According to the family is worse, he gets very angry and combative at times. Of note this, the last time he was seen in person was 04/2020 otherwise we have been doing video visits

## 2021-03-12 NOTE — Telephone Encounter (Signed)
Spoke to [place] (daughter) about patient. He has had right leg swelling for 11 days. Denies foot pain/just small amount of pain localized 2 inches below knee.

## 2021-03-13 ENCOUNTER — Other Ambulatory Visit: Payer: Self-pay | Admitting: Internal Medicine

## 2021-03-13 DIAGNOSIS — M25473 Effusion, unspecified ankle: Secondary | ICD-10-CM

## 2021-03-14 ENCOUNTER — Other Ambulatory Visit: Payer: Self-pay | Admitting: *Deleted

## 2021-03-14 DIAGNOSIS — I739 Peripheral vascular disease, unspecified: Secondary | ICD-10-CM

## 2021-03-18 ENCOUNTER — Other Ambulatory Visit: Payer: Self-pay

## 2021-03-18 ENCOUNTER — Ambulatory Visit (INDEPENDENT_AMBULATORY_CARE_PROVIDER_SITE_OTHER)
Admission: RE | Admit: 2021-03-18 | Discharge: 2021-03-18 | Disposition: A | Discharge status: Home / Self Care | Payer: Medicare Other | Source: Ambulatory Visit | Attending: Surgery | Admitting: Surgery

## 2021-03-18 ENCOUNTER — Ambulatory Visit (HOSPITAL_COMMUNITY)
Admission: RE | Admit: 2021-03-18 | Discharge: 2021-03-18 | Disposition: A | Discharge status: Home / Self Care | Payer: Medicare Other | Source: Ambulatory Visit | Attending: Surgery | Admitting: Surgery

## 2021-03-18 DIAGNOSIS — I739 Peripheral vascular disease, unspecified: Secondary | ICD-10-CM

## 2021-03-25 ENCOUNTER — Encounter: Payer: Self-pay | Admitting: Physician Assistant

## 2021-03-25 ENCOUNTER — Other Ambulatory Visit: Payer: Self-pay

## 2021-03-25 ENCOUNTER — Ambulatory Visit: Payer: Medicare Other | Admitting: Physician Assistant

## 2021-03-25 VITALS — BP 132/76 | HR 60 | Temp 98.0°F | Resp 20 | Ht 74.0 in | Wt 205.0 lb

## 2021-03-25 DIAGNOSIS — Z95828 Presence of other vascular implants and grafts: Secondary | ICD-10-CM

## 2021-03-25 DIAGNOSIS — I739 Peripheral vascular disease, unspecified: Secondary | ICD-10-CM

## 2021-03-25 NOTE — Progress Notes (Signed)
Office Note     CC:  follow up Requesting Provider:  [name], MD  HPI: Lonnie [name] is a 70 y.o. (10/24/1951) male who presents with his daughter due to right foot and calf pain for the past 10 days.  They also believe his right leg has become more edematous over the past several weeks.  He is status post right femoral to below the knee popliteal bypass on 01/20/2020 by Dr. Myra Gianotti.  He subsequently underwent exploration and washout of dehisced popliteal incision by Dr. Edilia Bo on 03/02/2020.  Popliteal incision is now well-healed.  He is ambulatory with a walker.  He states the pain in his right leg has improved since the time he has made his appointment.  Patient is known to have dementia thus his daughter is helping provide a lot of the history today.  He is taking aspirin, Plavix, statin daily.  He is still an occasional smoker.  He denies any rest pain or tissue changes to the left lower extremity.  Past Medical History:  Diagnosis Date  . Allergy   . Anxiety   . Arthritis    left neck, shoulder, knee  . CAD (coronary artery disease)    a. anterior STEMI 10/2013 s/p 4V CABG with LIMA to mid LAD, SVG to OM, SVG to PDA, SVG to Diagonal.  . Chronic combined systolic and diastolic CHF (congestive heart failure) (HCC)   . Dementia (HCC)   . Depression   . Falls   . Hypertension   . Ischemic cardiomyopathy    a. EF 40-45% at time of CABG and in 2018.  . MI (myocardial infarction) (HCC)   . Peripheral neuropathy   . Prediabetes 09/01/2014  . PVD (peripheral vascular disease) (HCC)    a. s/p L SFA stents with now known bilateral SFA. b. right femoral to below the knee bypass by Dr. Myra Gianotti in 12/2019, c/b infection 02/2020.  . Stroke [facility])    seen on CT Scan  . Subdural hematoma (HCC)   . Tobacco abuse   . UTI (urinary tract infection)     Past Surgical History:  Procedure Laterality Date  . ABDOMINAL AORTAGRAM N/A 06/07/2014   Procedure: ABDOMINAL Ronny Flurry;  Surgeon:  Iran Ouch, MD;  Location: [facility] CATH LAB;  Service: Cardiovascular;  Laterality: N/A;  . ABDOMINAL AORTAGRAM N/A 03/07/2015   Procedure: ABDOMINAL Ronny Flurry;  Surgeon: Iran Ouch, MD;  Location: [facility] CATH LAB;  Service: Cardiovascular;  Laterality: N/A;  . ABDOMINAL AORTOGRAM W/LOWER EXTREMITY N/A 01/18/2020   Procedure: ABDOMINAL AORTOGRAM W/LOWER EXTREMITY - Right;  Surgeon: Iran Ouch, MD;  Location: [facility] INVASIVE CV LAB;  Service: Cardiovascular;  Laterality: N/A;  . APPENDECTOMY    . COLONOSCOPY WITH PROPOFOL N/A 10/11/2015   Procedure: COLONOSCOPY WITH PROPOFOL;  Surgeon: Charna Elizabeth, MD;  Location: [facility] ENDOSCOPY;  Service: Endoscopy;  Laterality: N/A;  . CORONARY ARTERY BYPASS GRAFT N/A 11/04/2013   Procedure: CORONARY ARTERY BYPASS GRAFTING (CABG) TIMES FOUR  USING LEFT INTERNAL MAMMARY ARTERY AND RIGHT AND LEFT SAPHENOUS LEG VEIN HARVESTED ENDOSCOPICALLY;  Surgeon: Loreli Slot, MD;  Location: [facility] OR;  Service: Open Heart Surgery;  Laterality: N/A;  . CRANIOTOMY Left 06/30/2019   Procedure: Frontal CRANIOTOMY HEMATOMA EVACUATION SUBDURAL;  Surgeon: Lisbeth Renshaw, MD;  Location: [facility] OR;  Service: Neurosurgery;  Laterality: Left;  Frontal CRANIOTOMY HEMATOMA EVACUATION SUBDURAL  . FEMORAL-POPLITEAL BYPASS GRAFT Right 01/20/2020   Procedure: BYPASS GRAFT FEMORAL-POPLITEAL ARTERY RIGHT LEG USING GORE PROPATEN GRAFT;  Surgeon:  Nada LibmanBrabham, Vance W, MD;  [facility];  Service: Vascular;  Laterality: Right;  . FINGER SURGERY  2017   injury  . I & D EXTREMITY Right 03/02/2020   Procedure: IRRIGATION AND DEBRIDEMENT EXTREMITY right  lower leg  with Antibiotic beads.;  Surgeon: Chuck Hintickson, Christopher S, MD;  [facility];  Service: Vascular;  Laterality: Right;  . KNEE SURGERY     fractured patella  . LEFT HEART CATH N/A 10/29/2013   Procedure: LEFT HEART CATH;  Surgeon: Kathleene Hazelhristopher D McAlhany, MD;  [facility];  Service: Cardiovascular;  Laterality: N/A;  . LEFT HEART  CATHETERIZATION WITH CORONARY ANGIOGRAM N/A 10/31/2013   Procedure: LEFT HEART CATHETERIZATION WITH CORONARY ANGIOGRAM;  Surgeon: Kathleene Hazelhristopher D McAlhany, MD;  [facility];  Service: Cardiovascular;  Laterality: N/A;  . MOUTH SURGERY    . TOE SURGERY      Social History   Socioeconomic History  . Marital status: Married    Spouse name: [name]  . Number of children: 1  . Years of education: Not on file  . Highest education level: Not on file  Occupational History  . Occupation: long term disability --Event organiserpilot    Employer: [employer]  Tobacco Use  . Smoking status: Current Every Day Smoker    Packs/day: 0.25    Years: 30.00    Pack years: 7.50    Types: Cigarettes  . Smokeless tobacco: Never Used  . Tobacco comment: < 1/2 ppd  Vaping Use  . Vaping Use: Never used  Substance and Sexual Activity  . Alcohol use: No    Alcohol/week: 0.0 standard drinks  . Drug use: No  . Sexual activity: Yes  Other Topics Concern  . Not on file  Social History Narrative   Household-- pt and wife   1 daughter    Social Determinants of Health   Financial Resource Strain: Not on file  Food Insecurity: Not on file  Transportation Needs: Not on file  Physical Activity: Not on file  Stress: Not on file  Social Connections: Not on file  Intimate Partner Violence: Not on file    Family History  Problem Relation Age of Onset  . CAD Father [id number]  . AAA (abdominal aortic aneurysm) Father   . Alcohol abuse Father   . Hypertension Father   . Diabetes Father   . Heart attack Father   . Stroke Mother   . Arthritis Mother   . Heart disease Mother   . Hypertension Mother   . Heart attack Mother   . Cardiomyopathy Daughter   . Colon cancer Neg Hx   . Prostate cancer Neg Hx     Current Outpatient Medications  Medication Sig Dispense Refill  . acetaminophen (TYLENOL) 325 MG tablet Take 2 tablets (650 mg total) by mouth 4 (four) times daily -  before meals and at bedtime. (Patient  taking differently: Take 650 mg by mouth 2 (two) times daily.)    . aspirin EC 81 MG tablet Take 81 mg by mouth daily.    Marland Kitchen. atorvastatin (LIPITOR) 80 MG tablet Take 1 tablet (80 mg total) by mouth daily. 90 tablet 1  . Calcium Carb-Cholecalciferol 600-800 MG-UNIT TABS Take 1 tablet by mouth daily.    . citalopram (CELEXA) 20 MG tablet Take 1.5 tablet by mouth daily for 1 week, then increase to 2 tablets daily 180 tablet 0  . clonazePAM (KLONOPIN) 0.25 MG disintegrating tablet Take 1 tablet (0.25 mg total) by mouth 2 (two)  times daily as needed. 60 tablet 1  . clopidogrel (PLAVIX) 75 MG tablet Take 1 tablet (75 mg total) by mouth daily. 90 tablet 3  . docusate sodium (COLACE) 100 MG capsule Take 100 mg by mouth daily.    . fluticasone (FLONASE) 50 MCG/ACT nasal spray Place 2 sprays into both nostrils daily.  2  . furosemide (LASIX) 20 MG tablet Take 1 tablet (20 mg total) by mouth daily as needed (ankle swelling). 90 tablet 0  . gabapentin (NEURONTIN) 100 MG capsule Take 1 capsule (100 mg total) by mouth 2 (two) times daily. 180 capsule 1  . irbesartan (AVAPRO) 300 MG tablet Take 1 tablet (300 mg total) by mouth daily. 90 tablet 1  . Liniments (BEN GAY EX) Apply 1 application topically as needed (typically right knee).    . Melatonin 3 MG TABS Take 6 mg by mouth at bedtime.     . metoprolol tartrate (LOPRESSOR) 25 MG tablet Take 1 tablet (25 mg total) by mouth 2 (two) times daily. 180 tablet 1  . Multiple Vitamin (MULTIVITAMIN WITH MINERALS) TABS tablet Take 1 tablet by mouth daily.    . nicotine (NICODERM CQ - DOSED IN MG/24 HR) 7 mg/24hr patch Place 1 patch (7 mg total) onto the skin daily. 28 patch 0  . pantoprazole (PROTONIX) 40 MG tablet Take 1 tablet (40 mg total) by mouth at bedtime. 90 tablet 3  . QUEtiapine (SEROQUEL) 200 MG tablet Take 1 tablet (200 mg total) by mouth 2 (two) times daily. 180 tablet 1  . tamsulosin (FLOMAX) 0.4 MG CAPS capsule Take 2 capsules (0.8 mg total) by mouth daily  after supper. 180 capsule 3  . traMADol (ULTRAM) 50 MG tablet Take 50 mg by mouth daily.     No current facility-administered medications for this visit.    Allergies  Allergen Reactions  . Ace Inhibitors Swelling    Angioedema  . Dairy Aid [Lactase]     gas  . Eggs Or Egg-Derived Products     Cannot eat Prepared Eggs  . Latex Itching  . Other     Other reaction(s): Unknown     REVIEW OF SYSTEMS:   [X]  denotes positive finding, [ ]  denotes negative finding Cardiac  Comments:  Chest pain or chest pressure:    Shortness of breath upon exertion:    Short of breath when lying flat:    Irregular heart rhythm:        Vascular    Pain in calf, thigh, or hip brought on by ambulation:    Pain in feet at night that wakes you up from your sleep:     Blood clot in your veins:    Leg swelling:         Pulmonary    Oxygen at home:    Productive cough:     Wheezing:         Neurologic    Sudden weakness in arms or legs:     Sudden numbness in arms or legs:     Sudden onset of difficulty speaking or slurred speech:    Temporary loss of vision in one eye:     Problems with dizziness:         Gastrointestinal    Blood in stool:     Vomited blood:         Genitourinary    Burning when urinating:     Blood in urine:        Psychiatric  Major depression:         Hematologic    Bleeding problems:    Problems with blood clotting too easily:        Skin    Rashes or ulcers:        Constitutional    Fever or chills:      PHYSICAL EXAMINATION:  Vitals:   03/25/21 1413  BP: 132/76  Pulse: 60  Resp: 20  Temp: 98 F (36.7 C)  TempSrc: Temporal  SpO2: 100%  Weight: 205 lb (93 kg)  Height: 6\' 2"  (1.88 m)    General:  WDWN in NAD; vital signs documented above Gait: Not observed HENT: WNL, normocephalic Pulmonary: normal non-labored breathing , without Rales, rhonchi,  wheezing Cardiac: regular HR Abdomen: soft, NT, no masses Skin: without rashes Vascular  Exam/Pulses: Extremities: without ischemic changes, without Gangrene , without cellulitis; without open wounds;  Musculoskeletal: no muscle wasting or atrophy  Neurologic: A&O X 3;  No focal weakness or paresthesias are detected Psychiatric:  The pt has Normal affect.   Non-Invasive Vascular Imaging:   Right lower extremity bypass graft widely patent without any hemodynamically significant stenosis  ABI/TBIToday's ABIToday's TBIPrevious ABIPrevious TBI  +-------+-----------+-----------+------------+------------+  Right 1.15    0.55    0.44    0.34      +-------+-----------+-----------+------------+------------+  Left  0.54    0.30    0.70    0.32        ASSESSMENT/PLAN:: 70 y.o. male here for surveillance of right lower extremity bypass graft given 10-day history of right lower extremity pain  -Subjectively the pain has nearly resolved since the office appointment was made -Arterial duplex demonstrates a widely patent right lower extremity bypass graft with biphasic flow to the level of the ankle -He does have some pitting edema at his right ankle and shin; recommended conservative measures including light knee-high compression as well as elevation of his leg above his heart when he is sitting -Continue aspirin, Plavix, statin daily -Encouraged smoking cessation -Recheck bypass duplex with ABIs in 1 year per protocol   79, PA-C [facility] [phone]  Clinic MD:   [phone]

## 2021-04-01 ENCOUNTER — Telehealth: Payer: Self-pay | Admitting: Internal Medicine

## 2021-04-01 NOTE — Telephone Encounter (Signed)
Requesting: clonazepam 0.25mg  Contract: None UDS: None Last Visit: 03/11/2021 Next Visit: None Last Refill: 12/19/2020 #60 and 1RF  Please Advise

## 2021-04-02 ENCOUNTER — Telehealth: Payer: Self-pay | Admitting: Internal Medicine

## 2021-04-02 MED ORDER — CLONAZEPAM 0.25 MG PO TBDP: 0.2500 mg | ORAL_TABLET | Freq: Two times a day (BID) | ORAL | 0 refills | Status: DC | PRN

## 2021-04-02 NOTE — Telephone Encounter (Signed)
Please advise 

## 2021-04-02 NOTE — Telephone Encounter (Signed)
No need to check  urinalysis or fever chills or other symptoms. If he has symptoms needs to be seen in person.

## 2021-04-02 NOTE — Telephone Encounter (Signed)
He received a 90-day supply on 01/14/2021, is a little early but will go ahead and send the prescription

## 2021-04-02 NOTE — Telephone Encounter (Signed)
Patient suffers from recurring Urinary Tract Infections, please advise if patient's daughter can come in to get a urine cup to collect patient's sample ?  Please advise

## 2021-04-03 NOTE — Telephone Encounter (Signed)
LM for Lonnie [name] to return call.

## 2021-04-08 ENCOUNTER — Other Ambulatory Visit: Payer: Self-pay | Admitting: Internal Medicine

## 2021-04-08 ENCOUNTER — Telehealth: Payer: Self-pay | Admitting: Internal Medicine

## 2021-04-08 NOTE — Telephone Encounter (Signed)
Medication:clonazePAM (KLONOPIN) 0.25 MG disintegrating tablet [[id number]]       Has the patient contacted their pharmacy? no (If no, request that the patient contact the pharmacy for the refill.) (If yes, when and what did the pharmacy advise?)    Preferred Pharmacy (with phone number or street name):  [facility] - [place] - [address] [place], [address] Phone:  [phone]  Fax:  [phone]        Agent: Please be advised that RX refills may take up to 3 business days. We ask that you follow-up with your pharmacy.

## 2021-04-08 NOTE — Telephone Encounter (Signed)
Rx sent to [facility] (as requested previously by daughter [name])- on 04/02/21.

## 2021-04-10 ENCOUNTER — Telehealth: Payer: Self-pay

## 2021-04-10 NOTE — Telephone Encounter (Signed)
-----   Message from Emilie Rutter, PA-C sent at 04/10/2021  1:28 PM EDT ----- Regarding: RE: Meds Our last surgery with him was March of 2021.  He can't get more narcotics from [place].  He can take tylenol.  He can also be referred to pain management if he still needs narcotics. Thanks, Matt ----- Message ----- From: Yolonda Kida, LPN Sent: 5/70/1779  11:16 AM EDT To: Emilie Rutter, PA-C Subject: Meds                                           Hey again.  Hopefully this is the last message this morning but this pt's niece called saying they failed to ask you for a refill of his pain pills when they were here last.  Pt is almost out.    Thanks again,  [place].

## 2021-04-10 NOTE — Telephone Encounter (Signed)
I called and left a voice message for patient's niece relaying the answer to her request for more pain medicine.

## 2021-04-11 ENCOUNTER — Telehealth: Payer: Self-pay | Admitting: Internal Medicine

## 2021-04-11 ENCOUNTER — Telehealth: Payer: Self-pay

## 2021-04-11 DIAGNOSIS — N390 Urinary tract infection, site not specified: Secondary | ICD-10-CM

## 2021-04-11 NOTE — Telephone Encounter (Signed)
Patient's daughter called today with the following message:  Patient  States at this time is unable to lift her father to bring him in due to his level of pain with the UTI she wanted to know if she could get MEDS sent over so he can get meds in his system she will be able to bring him into the office.. She states she  Is trying to prevent his chance of falling.   Please call patient's daughter: Agree with fall prevention. If she thinks her father has a UTI recommend to pick up a sterile container, and bring a fresh sample of urine ASAP.  Send a UA urine culture. I really cannot send an antibiotic based on the information I have. If he is really sick, needs to be seen in person.

## 2021-04-11 NOTE — Telephone Encounter (Signed)
Opening a new phone note today.

## 2021-04-11 NOTE — Telephone Encounter (Signed)
Patient  States at this time is unable to lift her father to bring him in due to his level of pain with the UTI she wanted to know if she could get MEDS sent over so he can get meds in his system she will be able to bring him into the office.. She states she  Is trying to prevent his chance of falling.

## 2021-04-11 NOTE — Telephone Encounter (Signed)
Spoke w/ Lonnie [name]- informed of recommendations. Lonnie [name] already has a sterile urine container at home- she will drop off in the morning. Orders placed.

## 2021-04-11 NOTE — Telephone Encounter (Signed)
Patients daughter called to report that patient is still having swelling in the right leg and pain down to the foot. Swelling is decreased in the morning. Says she thinks his pain is due to the weather changing. Leg is warm. Seen in clinic 3 weeks ago, bypass open and leg swelling present. Advised we could not give him any pain medication, to continue elevation, compression and tylenol. She said "that won't work, but [employer] figure something out." Instructed to call back if severe unrelenting pain developed, ulcers appeared, or leg/foot turned cold. Verbalized understanding.

## 2021-04-12 ENCOUNTER — Other Ambulatory Visit: Payer: Self-pay

## 2021-04-12 ENCOUNTER — Other Ambulatory Visit (INDEPENDENT_AMBULATORY_CARE_PROVIDER_SITE_OTHER): Payer: Medicare Other

## 2021-04-12 DIAGNOSIS — N390 Urinary tract infection, site not specified: Secondary | ICD-10-CM

## 2021-04-12 LAB — URINALYSIS, ROUTINE W REFLEX MICROSCOPIC
Bilirubin Urine: NEGATIVE
Hgb urine dipstick: NEGATIVE
Ketones, ur: NEGATIVE
Leukocytes,Ua: NEGATIVE
Nitrite: POSITIVE — AB
Specific Gravity, Urine: >=1.03 — AB (ref 1.000–1.030)
Total Protein, Urine: NEGATIVE
Urine Glucose: NEGATIVE
Urobilinogen, UA: 0.2 (ref 0.0–1.0)
pH: 5.5 (ref 5.0–8.0)

## 2021-04-14 ENCOUNTER — Telehealth: Payer: Self-pay | Admitting: Internal Medicine

## 2021-04-14 LAB — URINE CULTURE
MICRO NUMBER:: 11803299
SPECIMEN QUALITY:: ADEQUATE

## 2021-04-14 MED ORDER — CIPROFLOXACIN HCL 500 MG PO TABS: 500.0000 mg | ORAL_TABLET | Freq: Two times a day (BID) | ORAL | 0 refills | Status: DC

## 2021-04-14 NOTE — Telephone Encounter (Signed)
Spoke with the patient's daughter regards today + urine culture. For the last few days, p.o. intake has decreased, he is more confused than usual. No fever chills No dysuria or blood in the urine. Daughter reports typically above symptoms represent a UTI. Urine culture: Pseudomonas. Will proceed ciprofloxacin 500 B.I.D. #14.  Rx sent

## 2021-04-19 ENCOUNTER — Other Ambulatory Visit: Payer: Self-pay

## 2021-04-19 ENCOUNTER — Encounter: Payer: Self-pay | Admitting: Podiatry

## 2021-04-19 ENCOUNTER — Ambulatory Visit: Payer: Medicare Other | Admitting: Podiatry

## 2021-04-19 DIAGNOSIS — I739 Peripheral vascular disease, unspecified: Secondary | ICD-10-CM | POA: Diagnosis not present

## 2021-04-19 DIAGNOSIS — M79675 Pain in left toe(s): Secondary | ICD-10-CM

## 2021-04-19 DIAGNOSIS — B351 Tinea unguium: Secondary | ICD-10-CM | POA: Diagnosis not present

## 2021-04-19 DIAGNOSIS — R601 Generalized edema: Secondary | ICD-10-CM

## 2021-04-19 DIAGNOSIS — M79674 Pain in right toe(s): Secondary | ICD-10-CM

## 2021-04-23 ENCOUNTER — Encounter: Payer: Self-pay | Admitting: Podiatry

## 2021-04-23 NOTE — Progress Notes (Signed)
  Subjective:  Patient ID: RAJVEER HANDLER, male    DOB: [birth year],  MRN: [id number]  Chief Complaint  Patient presents with  . Nail Problem    Nail trim    70 y.o. male returns for the above complaint.  Patient presents with thickened elongated dystrophic toenails x10.  Patient states that they are painful to touch.  Patient would like to have them debrided down.  He is not able to do it himself.  He has secondary complaint of swelling to the right leg.  Patient states that he does not do anything to help with the swelling he says he is notices right leg swelled up a lot more.  He has not been doing more activities and needed.  He denies any other acute complaints.  He would like to discuss treatment options Objective:  There were no vitals filed for this visit. Podiatric Exam: Vascular: dorsalis pedis and posterior tibial pulses are palpable bilateral. Capillary return is immediate. Temperature gradient is WNL. Skin turgor WNL  Sensorium: Normal Semmes Weinstein monofilament test. Normal tactile sensation bilaterally. Nail Exam: Pt has thick disfigured discolored nails with subungual debris noted bilateral entire nail hallux through fifth toenails.  Pain on palpation to the nails. Ulcer Exam: There is no evidence of ulcer or pre-ulcerative changes or infection. Orthopedic Exam: Muscle tone and strength are WNL. No limitations in general ROM. No crepitus or effusions noted. HAV  B/L.  Hammer toes 2-5  B/L.  Pain on palpation to the medial lateral border of the right hallux.  No clinical signs of clinical signs of infection noted. Skin: No Porokeratosis.  No ulcers noted.  1+ pitting edema noted.  No ulcers noted.    Assessment & Plan:   1. Generalized edema   2. Pain due to onychomycosis of toenails of both feet   3. Peripheral vascular disease (HCC)     Patient was evaluated and treated and all questions answered.  Right leg generalized/dependent edema -I explained to the patient  the etiology of edema and various treatment options were discussed.  This is likely generalized dependent edema I discussed with the patient impression compression socks and elevation.  I discussed RICE protocol with them as well as compression therapy.  Patient states understanding will obtain compression socks.  Right hallux nail mild contusion -Clinically resolved Onychomycosis with pain  -Nails palliatively debrided as below. -Educated on self-care  Procedure: Nail Debridement Rationale: pain  Type of Debridement: manual, sharp debridement. Instrumentation: Nail nipper, rotary burr. Number of Nails: 10  Procedures and Treatment: Consent by patient was obtained for treatment procedures. The patient understood the discussion of treatment and procedures well. All questions were answered thoroughly reviewed. Debridement of mycotic and hypertrophic toenails, 1 through 5 bilateral and clearing of subungual debris. No ulceration, no infection noted.  Return Visit-Office Procedure: Patient instructed to return to the office for a follow up visit 3 months for continued evaluation and treatment.  Nicholes Rough, DPM    Return in about 3 months (around 07/19/2021) for [place] .

## 2021-04-25 ENCOUNTER — Telehealth: Payer: Self-pay | Admitting: Internal Medicine

## 2021-04-25 NOTE — Telephone Encounter (Signed)
[employer] called on behalf of the patient wife who states  .. 0 .Mr Rushlow is in need of a Wheelchair and lift. [employer] states the in network locations are [employer] & and [facility].  [facility]  [phone]  [facility] [phone]

## 2021-04-25 NOTE — Telephone Encounter (Signed)
Will need an office visit to document need for wheelchair and lift. Please schedule an appointment at their convenience. PCP will be out of the office from 5/9 to 5/24.

## 2021-04-29 NOTE — Telephone Encounter (Signed)
appt schedule with daughter

## 2021-05-07 ENCOUNTER — Other Ambulatory Visit: Payer: Self-pay | Admitting: Internal Medicine

## 2021-05-08 NOTE — Progress Notes (Signed)
Subjective:   Lonnie [name] is a 70 y.o. male who presents for Medicare Annual/Subsequent preventive examination.   I connected with Lonnie [name] today by telephone and verified that I am speaking with the correct person using two identifiers. Location patient: home Location provider: work Persons participating in the virtual visit: patient,daughter,  Engineer, civil (consulting).    I discussed the limitations, risks, security and privacy concerns of performing an evaluation and management service by telephone and the availability of in person appointments. I also discussed with the patient that there may be a patient responsible charge related to this service. The patient expressed understanding and verbally consented to this telephonic visit.    Interactive audio and video telecommunications were attempted between this provider and patient, however failed, due to patient having technical difficulties OR patient did not have access to video capability.  We continued and completed visit with audio only.  Some vital signs may be absent or patient reported.   Time Spent with patient on telephone encounter: 30 minutes    Review of Systems     Cardiac Risk Factors include: male gender;advanced age (>46men, >64 women);hypertension     Objective:    Today's Vitals   05/09/21 0946  Weight: 205 lb (93 kg)  Height: 6\' 2"  (1.88 m)  PainSc: 6    Body mass index is 26.32 kg/m.  Advanced Directives 05/09/2021 09/09/2020 04/23/2020 03/05/2020 01/23/2020 01/20/2020 01/18/2020  Does Patient Have a Medical Advance Directive? Yes No Yes Yes Yes Yes No  Type of 01/20/2020 of [place];Living will - Healthcare Power of [place];Living will Living will Healthcare Power of [place] Power of Attorney -  Does patient want to make changes to medical advance directive? - - No - Patient declined No - Patient declined No - Patient declined - -  Copy of Healthcare Power of Attorney in Chart? Yes -  validated most recent copy scanned in chart (See row information) - No - copy requested - - Yes - validated most recent copy scanned in chart (See row information) -  Would patient like information on creating a medical advance directive? - - - - - - No - Patient declined  Pre-existing out of facility DNR order (yellow form or pink MOST form) - - - - - - -    Current Medications (verified) Outpatient Encounter Medications as of 05/09/2021  Medication Sig  . acetaminophen (TYLENOL) 325 MG tablet Take 2 tablets (650 mg total) by mouth 4 (four) times daily -  before meals and at bedtime. (Patient taking differently: Take 650 mg by mouth 2 (two) times daily.)  . aspirin EC 81 MG tablet Take 81 mg by mouth daily.  05/11/2021 atorvastatin (LIPITOR) 80 MG tablet Take 1 tablet (80 mg total) by mouth daily.  . Calcium Carb-Cholecalciferol 600-800 MG-UNIT TABS Take 1 tablet by mouth daily.  . ciprofloxacin (CIPRO) 500 MG tablet Take 1 tablet (500 mg total) by mouth 2 (two) times daily.  . citalopram (CELEXA) 20 MG tablet Take 1.5 tablet by mouth daily for 1 week, then increase to 2 tablets daily  . clonazePAM (KLONOPIN) 0.25 MG disintegrating tablet Take 1 tablet (0.25 mg total) by mouth 2 (two) times daily as needed.  . clopidogrel (PLAVIX) 75 MG tablet Take 1 tablet (75 mg total) by mouth daily.  Marland Kitchen docusate sodium (COLACE) 100 MG capsule Take 100 mg by mouth daily.  . fluticasone (FLONASE) 50 MCG/ACT nasal spray Place 2 sprays into both nostrils daily.  Marland Kitchen  furosemide (LASIX) 20 MG tablet Take 1 tablet (20 mg total) by mouth daily as needed (ankle swelling).  . gabapentin (NEURONTIN) 100 MG capsule Take 1 capsule (100 mg total) by mouth 2 (two) times daily.  . irbesartan (AVAPRO) 300 MG tablet Take 1 tablet (300 mg total) by mouth daily.  . Liniments (BEN GAY EX) Apply 1 application topically as needed (typically right knee).  . Melatonin 3 MG TABS Take 6 mg by mouth at bedtime.   . metoprolol tartrate  (LOPRESSOR) 25 MG tablet Take 1 tablet (25 mg total) by mouth 2 (two) times daily.  . Multiple Vitamin (MULTIVITAMIN WITH MINERALS) TABS tablet Take 1 tablet by mouth daily.  . nicotine (NICODERM CQ - DOSED IN MG/24 HR) 7 mg/24hr patch Place 1 patch (7 mg total) onto the skin daily.  . pantoprazole (PROTONIX) 40 MG tablet Take 1 tablet (40 mg total) by mouth at bedtime.  Marland Kitchen QUEtiapine (SEROQUEL) 200 MG tablet Take 1 tablet (200 mg total) by mouth 2 (two) times daily.  . tamsulosin (FLOMAX) 0.4 MG CAPS capsule Take 2 capsules (0.8 mg total) by mouth daily after supper.  . traMADol (ULTRAM) 50 MG tablet Take 50 mg by mouth daily.   No facility-administered encounter medications on file as of 05/09/2021.    Allergies (verified) Ace inhibitors, Dairy aid [lactase], Eggs or egg-derived products, Latex, and Other   History: Past Medical History:  Diagnosis Date  . Allergy   . Anxiety   . Arthritis    left neck, shoulder, knee  . CAD (coronary artery disease)    a. anterior STEMI 10/2013 s/p 4V CABG with LIMA to mid LAD, SVG to OM, SVG to PDA, SVG to Diagonal.  . Chronic combined systolic and diastolic CHF (congestive heart failure) (HCC)   . Dementia (HCC)   . Depression   . Falls   . Hypertension   . Ischemic cardiomyopathy    a. EF 40-45% at time of CABG and in 2018.  . MI (myocardial infarction) (HCC)   . Peripheral neuropathy   . Prediabetes 09/01/2014  . PVD (peripheral vascular disease) (HCC)    a. s/p L SFA stents with now known bilateral SFA. b. right femoral to below the knee bypass by Dr. Myra Gianotti in 12/2019, c/b infection 02/2020.  . Stroke [facility])    seen on CT Scan  . Subdural hematoma (HCC)   . Tobacco abuse   . UTI (urinary tract infection)    Past Surgical History:  Procedure Laterality Date  . ABDOMINAL AORTAGRAM N/A 06/07/2014   Procedure: ABDOMINAL Ronny Flurry;  Surgeon: Iran Ouch, MD;  Location: [facility] CATH LAB;  Service: Cardiovascular;  Laterality: N/A;  .  ABDOMINAL AORTAGRAM N/A 03/07/2015   Procedure: ABDOMINAL Ronny Flurry;  Surgeon: Iran Ouch, MD;  Location: [facility] CATH LAB;  Service: Cardiovascular;  Laterality: N/A;  . ABDOMINAL AORTOGRAM W/LOWER EXTREMITY N/A 01/18/2020   Procedure: ABDOMINAL AORTOGRAM W/LOWER EXTREMITY - Right;  Surgeon: Iran Ouch, MD;  Location: [facility] INVASIVE CV LAB;  Service: Cardiovascular;  Laterality: N/A;  . APPENDECTOMY    . COLONOSCOPY WITH PROPOFOL N/A 10/11/2015   Procedure: COLONOSCOPY WITH PROPOFOL;  Surgeon: Charna Elizabeth, MD;  Location: [facility] ENDOSCOPY;  Service: Endoscopy;  Laterality: N/A;  . CORONARY ARTERY BYPASS GRAFT N/A 11/04/2013   Procedure: CORONARY ARTERY BYPASS GRAFTING (CABG) TIMES FOUR  USING LEFT INTERNAL MAMMARY ARTERY AND RIGHT AND LEFT SAPHENOUS LEG VEIN HARVESTED ENDOSCOPICALLY;  Surgeon: Loreli Slot, MD;  Location: [facility] OR;  Service: Open Heart Surgery;  Laterality: N/A;  . CRANIOTOMY Left 06/30/2019   Procedure: Frontal CRANIOTOMY HEMATOMA EVACUATION SUBDURAL;  Surgeon: Lisbeth Renshaw, MD;  Location: [facility] OR;  Service: Neurosurgery;  Laterality: Left;  Frontal CRANIOTOMY HEMATOMA EVACUATION SUBDURAL  . FEMORAL-POPLITEAL BYPASS GRAFT Right 01/20/2020   Procedure: BYPASS GRAFT FEMORAL-POPLITEAL ARTERY RIGHT LEG USING GORE PROPATEN GRAFT;  Surgeon: Nada Libman, MD;  Location: [facility] OR;  Service: Vascular;  Laterality: Right;  . FINGER SURGERY  2017   injury  . I & D EXTREMITY Right 03/02/2020   Procedure: IRRIGATION AND DEBRIDEMENT EXTREMITY right  lower leg  with Antibiotic beads.;  Surgeon: Chuck Hint, MD;  Location[facility];  Service: Vascular;  Laterality: Right;  . KNEE SURGERY     fractured patella  . LEFT HEART CATH N/A 10/29/2013   Procedure: LEFT HEART CATH;  Surgeon: Kathleene Hazel, MD;  Location: [facility] CATH LAB;  Service: Cardiovascular;  Laterality: N/A;  . LEFT HEART CATHETERIZATION WITH CORONARY ANGIOGRAM N/A 10/31/2013   Procedure: LEFT HEART  CATHETERIZATION WITH CORONARY ANGIOGRAM;  Surgeon: Kathleene Hazel, MD;  Location: [facility] CATH LAB;  Service: Cardiovascular;  Laterality: N/A;  . MOUTH SURGERY    . TOE SURGERY     Family History  Problem Relation Age of Onset  . CAD Father 30  . AAA (abdominal aortic aneurysm) Father   . Alcohol abuse Father   . Hypertension Father   . Diabetes Father   . Heart attack Father   . Stroke Mother   . Arthritis Mother   . Heart disease Mother   . Hypertension Mother   . Heart attack Mother   . Cardiomyopathy Daughter   . Colon cancer Neg Hx   . Prostate cancer Neg Hx    Social History   Socioeconomic History  . Marital status: Married    Spouse name: [name]  . Number of children: 1  . Years of education: Not on file  . Highest education level: Not on file  Occupational History  . Occupation: long term disability --Event organiser: [employer]  Tobacco Use  . Smoking status: Current Every Day Smoker    Packs/day: 0.25    Years: 30.00    Pack years: 7.50    Types: Cigarettes  . Smokeless tobacco: Never Used  . Tobacco comment: < 1/2 ppd  Vaping Use  . Vaping Use: Never used  Substance and Sexual Activity  . Alcohol use: No    Alcohol/week: 0.0 standard drinks  . Drug use: No  . Sexual activity: Yes  Other Topics Concern  . Not on file  Social History Narrative   Household-- pt and wife   1 daughter    Social Determinants of Health   Financial Resource Strain: Low Risk   . Difficulty of Paying Living Expenses: Not hard at all  Food Insecurity: No Food Insecurity  . Worried About Programme researcher, broadcasting/film/video in the Last Year: Never true  . Ran Out of Food in the Last Year: Never true  Transportation Needs: No Transportation Needs  . Lack of Transportation (Medical): No  . Lack of Transportation (Non-Medical): No  Physical Activity: Inactive  . Days of Exercise per Week: 0 days  . Minutes of Exercise per Session: 0 min  Stress: No Stress Concern Present  .  Feeling of Stress : Not at all  Social Connections: Moderately Isolated  . Frequency of Communication with Friends and Family: More than three times  a week  . Frequency of Social Gatherings with Friends and Family: More than three times a week  . Attends Religious Services: Never  . Active Member of Clubs or Organizations: No  . Attends Banker Meetings: Never  . Marital Status: Married    Tobacco Counseling Ready to quit: Not Answered Counseling given: Not Answered Comment: < 1/2 ppd   Clinical Intake:  Pre-visit preparation completed: Yes  Pain : 0-10 Pain Score: 6  Pain Type: Chronic pain Pain Location: Neck (and back pain, pelvic pain & shoulder pain) Pain Onset: More than a month ago Pain Frequency: Constant     Nutritional Status: BMI 25 -29 Overweight Nutritional Risks: None Diabetes: No  How often do you need to have someone help you when you read instructions, pamphlets, or other written materials from your doctor or pharmacy?: 1 - Never  Diabetic?NO  Interpreter Needed?: No  Information entered by :: Thomasenia Sales LPN   Activities of Daily Living In your present state of health, do you have any difficulty performing the following activities: 05/09/2021  Hearing? N  Vision? N  Difficulty concentrating or making decisions? Y  Comment occasionally  Walking or climbing stairs? N  Dressing or bathing? N  Doing errands, shopping? N  Preparing Food and eating ? Y  Using the Toilet? N  In the past six months, have you accidently leaked urine? N  Do you have problems with loss of bowel control? N  Managing your Medications? Y  Managing your Finances? Y  Housekeeping or managing your Housekeeping? Y  Some recent data might be hidden    Patient Care Team: [name], MD as PCP - General (Internal Medicine) Kathleene Hazel, MD as PCP - Cardiology (Cardiology) Iran Ouch, MD as Consulting Physician (Cardiology) Merwyn Katos,  DPM as Consulting Physician (Podiatry) Charna Elizabeth, MD as Consulting Physician (Gastroenterology) Frederico Hamman, MD as Consulting Physician (Orthopedic Surgery)  Indicate any recent Medical Services you may have received from other than [facility] providers in the past year (date may be approximate).     Assessment:   This is a routine wellness examination for Lonnie [name].  Hearing/Vision screen  Hearing Screening   125Hz  250Hz  500Hz  1000Hz  2000Hz  3000Hz  4000Hz  6000Hz  8000Hz   Right ear:           Left ear:           Comments: No issues  Vision Screening Comments: Wears glasses Last eye exam-2 years ago  Dietary issues and exercise activities discussed: Current Exercise Habits: The patient does not participate in regular exercise at present, Exercise limited by: Other - see comments (past brain injury)  Goals Addressed            This Visit's Progress   . LIFESTYLE - DECREASE FALLS RISK   Not on track     Depression Screen PHQ 2/9 Scores 05/09/2021 01/25/2021 04/23/2020 12/27/2018 09/09/2018 07/28/2018 01/16/2017  PHQ - 2 Score 0 0 1 0 0 0 0  PHQ- 9 Score - 0 - 0 2 - -    Fall Risk Fall Risk  05/09/2021 01/25/2021 04/23/2020 06/30/2019 07/28/2018  Falls in the past year? 1 1 1 1  No  Number falls in past yr: 1 0 1 1 -  Injury with Fall? 0 0 1 1 -  Risk for fall due to : History of fall(s);Impaired mobility;Impaired balance/gait - Mental status change;History of fall(s);Impaired balance/gait - -  Follow up Falls prevention discussed - - - -  FALL RISK PREVENTION PERTAINING TO THE HOME:  Any stairs in or around the home? Yes  If so, are there any without handrails? No  Home free of loose throw rugs in walkways, pet beds, electrical cords, etc? Yes  Adequate lighting in your home to reduce risk of falls? Yes   ASSISTIVE DEVICES UTILIZED TO PREVENT FALLS:  Life alert? No  Use of a cane, walker or w/c? Yes  Grab bars in the bathroom? Yes  Shower chair or bench in shower? Yes  Elevated toilet  seat or a handicapped toilet? No   TIMED UP AND GO:  Was the test performed? No . Phone visit   Cognitive Function:Per daughter , patient has some occasional memory loss due to previous head Injury MMSE - Mini Mental State Exam 04/23/2020 09/28/2019  Not completed: Unable to complete -  Orientation to time - 4  Orientation to Place - 5  Registration - 3  Attention/ Calculation - 5  Recall - 3  Language- name 2 objects - 2  Language- repeat - 1  Language- follow 3 step command - 3  Language- read & follow direction - 1  Write a sentence - 1  Copy design - 0  Total score - 28        Immunizations Immunization History  Administered Date(s) Administered  . Fluad Quad(high Dose 65+) 09/07/2019  . Influenza, High Dose Seasonal PF 10/06/2020  . Influenza,inj,Quad PF,6+ Mos 10/02/2015  . Influenza,inj,quad, With Preservative 09/21/2018  . Influenza-Unspecified 10/09/2016, 09/30/2017  . PFIZER(Purple Top)SARS-COV-2 Vaccination 02/16/2020, 03/13/2020, 11/04/2020  . Pneumococcal Conjugate-13 01/02/2015, 09/12/2019  . Pneumococcal Polysaccharide-23 09/01/2014  . Tdap 04/09/2014    TDAP status: Up to date  Flu Vaccine status: Up to date  Pneumococcal vaccine status: Up to date  Covid-19 vaccine status: Completed vaccines  Qualifies for Shingles Vaccine? Yes   Zostavax completed No   Shingrix Completed?: No.    Education has been provided regarding the importance of this vaccine. Patient has been advised to call insurance company to determine out of pocket expense if they have not yet received this vaccine. Advised may also receive vaccine at local pharmacy or Health Dept. Verbalized acceptance and understanding.  Screening Tests Health Maintenance  Topic Date Due  . PNA vac Low Risk Adult (2 of 2 - PPSV23) 01/25/2031 (Originally 09/11/2020)  . INFLUENZA VACCINE  07/22/2021  . TETANUS/TDAP  04/09/2024  . COLONOSCOPY (Pts 45-46yrs Insurance coverage will need to be confirmed)   10/10/2025  . COVID-19 Vaccine  Completed  . Hepatitis C Screening  Completed  . HPV VACCINES  Aged Out    Health Maintenance  There are no preventive care reminders to display for this patient.  Colorectal cancer screening: Type of screening: Colonoscopy. Completed 10/11/2015. Repeat every 10 years  Lung Cancer Screening: (Low Dose CT Chest recommended if Age 29-80 years, 30 pack-year currently smoking OR have quit w/in 15years.) does not qualify.    Additional Screening:  Hepatitis C Screening:Completed 01/16/2017  Vision Screening: Recommended annual ophthalmology exams for early detection of glaucoma and other disorders of the eye. Is the patient up to date with their annual eye exam?  No  Who is the provider or what is the name of the office in which the patient attends annual eye exams? unsure   Dental Screening: Recommended annual dental exams for proper oral hygiene  Community Resource Referral / Chronic Care Management: CRR required this visit?  No   CCM required this visit?  No  Plan:     I have personally reviewed and noted the following in the patient's chart:   . Medical and social history . Use of alcohol, tobacco or illicit drugs  . Current medications and supplements including opioid prescriptions. Patient is not currently taking opioid prescriptions. . Functional ability and status . Nutritional status . Physical activity . Advanced directives . List of other physicians . Hospitalizations, surgeries, and ER visits in previous 12 months . Vitals . Screenings to include cognitive, depression, and falls . Referrals and appointments  In addition, I have reviewed and discussed with patient certain preventive protocols, quality metrics, and best practice recommendations. A written personalized care plan for preventive services as well as general preventive health recommendations were provided to patient.   Due to this being a telephonic visit, the  after visit summary with patients personalized plan was offered to patient via mail or my-chart. Patient would like to access on my-chart   Roanna RaiderMartha A Abel Hageman, [place]LPN   [id number]/19/2022  Nurse Health Advisor  Nurse Notes: Patient & daughter requesting an order for a Michiel SitesHoyer lift for home. Message sen to PCP.

## 2021-05-09 ENCOUNTER — Telehealth: Payer: Self-pay

## 2021-05-09 ENCOUNTER — Ambulatory Visit (INDEPENDENT_AMBULATORY_CARE_PROVIDER_SITE_OTHER): Payer: Medicare Other

## 2021-05-09 VITALS — Ht 74.0 in | Wt 205.0 lb

## 2021-05-09 DIAGNOSIS — Z0000 Encounter for general adult medical examination without abnormal findings: Secondary | ICD-10-CM | POA: Diagnosis not present

## 2021-05-09 NOTE — Telephone Encounter (Signed)
Patient & daughter are requesting an order for a Michiel Sites lift to use at home. They says that it is very difficult to get him up at times.

## 2021-05-09 NOTE — Telephone Encounter (Signed)
This will need to be ordered in an office visit d/t medicare guidelines, he has an appt scheduled w/ Dr. Drue Novel on 05/24/21.

## 2021-05-09 NOTE — Patient Instructions (Signed)
Lonnie [name] , Thank you for taking time to complete your Medicare Wellness Visit. I appreciate your ongoing commitment to your health goals. Please review the following plan we discussed and let me know if I can assist you in the future.   Screening recommendations/referrals: Colonoscopy: Completed 10/11/2015-Due 10/10/2025 Recommended yearly ophthalmology/optometry visit for glaucoma screening and checkup Recommended yearly dental visit for hygiene and checkup  Vaccinations: Influenza vaccine: Up to date Pneumococcal vaccine: Completed vaccines Tdap vaccine: Up to date-Due-04/09/2024 Shingles vaccine: Discuss with pharmacy   Covid-19: Up to date  Advanced directives: Copy in chart  Conditions/risks identified: See problem list  Next appointment: Follow up in one year for your annual wellness visit.   Preventive Care 68 Years and Older, Male Preventive care refers to lifestyle choices and visits with your health care provider that can promote health and wellness. What does preventive care include?  A yearly physical exam. This is also called an annual well check.  Dental exams once or twice a year.  Routine eye exams. Ask your health care provider how often you should have your eyes checked.  Personal lifestyle choices, including:  Daily care of your teeth and gums.  Regular physical activity.  Eating a healthy diet.  Avoiding tobacco and drug use.  Limiting alcohol use.  Practicing safe sex.  Taking low doses of aspirin every day.  Taking vitamin and mineral supplements as recommended by your health care provider. What happens during an annual well check? The services and screenings done by your health care provider during your annual well check will depend on your age, overall health, lifestyle risk factors, and family history of disease. Counseling  Your health care provider may ask you questions about your:  Alcohol use.  Tobacco use.  Drug  use.  Emotional well-being.  Home and relationship well-being.  Sexual activity.  Eating habits.  History of falls.  Memory and ability to understand (cognition).  Work and work Astronomer. Screening  You may have the following tests or measurements:  Height, weight, and BMI.  Blood pressure.  Lipid and cholesterol levels. These may be checked every 5 years, or more frequently if you are over 63 years old.  Skin check.  Lung cancer screening. You may have this screening every year starting at age 20 if you have a 30-pack-year history of smoking and currently smoke or have quit within the past 15 years.  Fecal occult blood test (FOBT) of the stool. You may have this test every year starting at age 27.  Flexible sigmoidoscopy or colonoscopy. You may have a sigmoidoscopy every 5 years or a colonoscopy every 10 years starting at age 11.  Prostate cancer screening. Recommendations will vary depending on your family history and other risks.  Hepatitis C blood test.  Hepatitis B blood test.  Sexually transmitted disease (STD) testing.  Diabetes screening. This is done by checking your blood sugar (glucose) after you have not eaten for a while (fasting). You may have this done every 1-3 years.  Abdominal aortic aneurysm (AAA) screening. You may need this if you are a current or former smoker.  Osteoporosis. You may be screened starting at age 45 if you are at high risk. Talk with your health care provider about your test results, treatment options, and if necessary, the need for more tests. Vaccines  Your health care provider may recommend certain vaccines, such as:  Influenza vaccine. This is recommended every year.  Tetanus, diphtheria, and acellular pertussis (Tdap, Td) vaccine. You  may need a Td booster every 10 years.  Zoster vaccine. You may need this after age 33.  Pneumococcal 13-valent conjugate (PCV13) vaccine. One dose is recommended after age  42.  Pneumococcal polysaccharide (PPSV23) vaccine. One dose is recommended after age 59. Talk to your health care provider about which screenings and vaccines you need and how often you need them. This information is not intended to replace advice given to you by your health care provider. Make sure you discuss any questions you have with your health care provider. Document Released: 01/04/2016 Document Revised: 08/27/2016 Document Reviewed: 10/09/2015 Elsevier Interactive Patient Education  2017 [place] Prevention in the Home Falls can cause injuries. They can happen to people of all ages. There are many things you can do to make your home safe and to help prevent falls. What can I do on the outside of my home?  Regularly fix the edges of walkways and driveways and fix any cracks.  Remove anything that might make you trip as you walk through a door, such as a raised step or threshold.  Trim any bushes or trees on the path to your home.  Use bright outdoor lighting.  Clear any walking paths of anything that might make someone trip, such as rocks or tools.  Regularly check to see if handrails are loose or broken. Make sure that both sides of any steps have handrails.  Any raised decks and porches should have guardrails on the edges.  Have any leaves, snow, or ice cleared regularly.  Use sand or salt on walking paths during winter.  Clean up any spills in your garage right away. This includes oil or grease spills. What can I do in the bathroom?  Use night lights.  Install grab bars by the toilet and in the tub and shower. Do not use towel bars as grab bars.  Use non-skid mats or decals in the tub or shower.  If you need to sit down in the shower, use a plastic, non-slip stool.  Keep the floor dry. Clean up any water that spills on the floor as soon as it happens.  Remove soap buildup in the tub or shower regularly.  Attach bath mats securely with double-sided  non-slip rug tape.  Do not have throw rugs and other things on the floor that can make you trip. What can I do in the bedroom?  Use night lights.  Make sure that you have a light by your bed that is easy to reach.  Do not use any sheets or blankets that are too big for your bed. They should not hang down onto the floor.  Have a firm chair that has side arms. You can use this for support while you get dressed.  Do not have throw rugs and other things on the floor that can make you trip. What can I do in the kitchen?  Clean up any spills right away.  Avoid walking on wet floors.  Keep items that you use a lot in easy-to-reach places.  If you need to reach something above you, use a strong step stool that has a grab bar.  Keep electrical cords out of the way.  Do not use floor polish or wax that makes floors slippery. If you must use wax, use non-skid floor wax.  Do not have throw rugs and other things on the floor that can make you trip. What can I do with my stairs?  Do not leave any items  on the stairs.  Make sure that there are handrails on both sides of the stairs and use them. Fix handrails that are broken or loose. Make sure that handrails are as long as the stairways.  Check any carpeting to make sure that it is firmly attached to the stairs. Fix any carpet that is loose or worn.  Avoid having throw rugs at the top or bottom of the stairs. If you do have throw rugs, attach them to the floor with carpet tape.  Make sure that you have a light switch at the top of the stairs and the bottom of the stairs. If you do not have them, ask someone to add them for you. What else can I do to help prevent falls?  Wear shoes that:  Do not have high heels.  Have rubber bottoms.  Are comfortable and fit you well.  Are closed at the toe. Do not wear sandals.  If you use a stepladder:  Make sure that it is fully opened. Do not climb a closed stepladder.  Make sure that both  sides of the stepladder are locked into place.  Ask someone to hold it for you, if possible.  Clearly mark and make sure that you can see:  Any grab bars or handrails.  First and last steps.  Where the edge of each step is.  Use tools that help you move around (mobility aids) if they are needed. These include:  Canes.  Walkers.  Scooters.  Crutches.  Turn on the lights when you go into a dark area. Replace any light bulbs as soon as they burn out.  Set up your furniture so you have a clear path. Avoid moving your furniture around.  If any of your floors are uneven, fix them.  If there are any pets around you, be aware of where they are.  Review your medicines with your doctor. Some medicines can make you feel dizzy. This can increase your chance of falling. Ask your doctor what other things that you can do to help prevent falls. This information is not intended to replace advice given to you by your health care provider. Make sure you discuss any questions you have with your health care provider. Document Released: 10/04/2009 Document Revised: 05/15/2016 Document Reviewed: 01/12/2015 Elsevier Interactive Patient Education  2017 [employer].

## 2021-05-13 ENCOUNTER — Telehealth: Payer: Self-pay | Admitting: Internal Medicine

## 2021-05-14 NOTE — Telephone Encounter (Signed)
Requesting: clonazepam 0.25mg  Contract: None UDS: None Last Visit:03/11/2021 Next Visit: 05/24/2021 Last Refill: 04/02/2021 #60 and 0RF  Please Advise

## 2021-05-14 NOTE — Telephone Encounter (Signed)
PDMP okay, 90-day supply sent 

## 2021-05-15 ENCOUNTER — Other Ambulatory Visit: Payer: Self-pay | Admitting: Internal Medicine

## 2021-05-24 ENCOUNTER — Other Ambulatory Visit: Payer: Self-pay

## 2021-05-24 ENCOUNTER — Ambulatory Visit (INDEPENDENT_AMBULATORY_CARE_PROVIDER_SITE_OTHER): Payer: Medicare Other | Admitting: Internal Medicine

## 2021-05-24 ENCOUNTER — Encounter: Payer: Self-pay | Admitting: Internal Medicine

## 2021-05-24 VITALS — BP 112/78 | HR 61 | Temp 98.8°F | Resp 18 | Ht 74.0 in

## 2021-05-24 DIAGNOSIS — G3109 Other frontotemporal dementia: Secondary | ICD-10-CM | POA: Diagnosis not present

## 2021-05-24 DIAGNOSIS — F0280 Dementia in other diseases classified elsewhere without behavioral disturbance: Secondary | ICD-10-CM

## 2021-05-24 DIAGNOSIS — R269 Unspecified abnormalities of gait and mobility: Secondary | ICD-10-CM | POA: Diagnosis not present

## 2021-05-24 DIAGNOSIS — S069X0S Unspecified intracranial injury without loss of consciousness, sequela: Secondary | ICD-10-CM | POA: Diagnosis not present

## 2021-05-24 DIAGNOSIS — R739 Hyperglycemia, unspecified: Secondary | ICD-10-CM | POA: Diagnosis not present

## 2021-05-24 DIAGNOSIS — I10 Essential (primary) hypertension: Secondary | ICD-10-CM

## 2021-05-24 DIAGNOSIS — D649 Anemia, unspecified: Secondary | ICD-10-CM | POA: Diagnosis not present

## 2021-05-24 MED ORDER — TRAMADOL HCL 50 MG PO TABS: 50.0000 mg | ORAL_TABLET | Freq: Every evening | ORAL | 0 refills | Status: DC | PRN

## 2021-05-24 NOTE — Progress Notes (Signed)
Subjective:    Patient ID: Lonnie [name], male    DOB: Jan 27, 1951, 70 y.o.   MRN: [id number]  DOS:  05/24/2021 Type of visit - description: Routine checkup  The patient is in need of equipment to help with his gait. The daughter reports that he is homebound, does not drive.  At home, despite  having a walker, he is unable to take his own showers, transfer is quite difficult due to  pain, deconditioning, peripheral vascular disease, lack of balance..  In the last few months, right leg pain and swelling has been more noticeable.  They already saw vascular and he was found to have acceptable circulation.  Since he is here, will also talk about hypertension, high cholesterol, CAD.  Review of Systems Denies chest pain no difficulty breathing No fever chills He still smokes  Past Medical History:  Diagnosis Date  . Allergy   . Anxiety   . Arthritis    left neck, shoulder, knee  . CAD (coronary artery disease)    a. anterior STEMI 10/2013 s/p 4V CABG with LIMA to mid LAD, SVG to OM, SVG to PDA, SVG to Diagonal.  . Chronic combined systolic and diastolic CHF (congestive heart failure) (HCC)   . Dementia (HCC)   . Depression   . Falls   . Hypertension   . Ischemic cardiomyopathy    a. EF 40-45% at time of CABG and in 2018.  . MI (myocardial infarction) (HCC)   . Peripheral neuropathy   . Prediabetes 09/01/2014  . PVD (peripheral vascular disease) (HCC)    a. s/p L SFA stents with now known bilateral SFA. b. right femoral to below the knee bypass by Dr. Myra Gianotti in 12/2019, c/b infection 02/2020.  . Stroke [facility])    seen on CT Scan  . Subdural hematoma (HCC)   . Tobacco abuse   . UTI (urinary tract infection)     Past Surgical History:  Procedure Laterality Date  . ABDOMINAL AORTAGRAM N/A 06/07/2014   Procedure: ABDOMINAL Ronny Flurry;  Surgeon: Iran Ouch, MD;  Location: [facility] CATH LAB;  Service: Cardiovascular;  Laterality: N/A;  . ABDOMINAL AORTAGRAM N/A 03/07/2015    Procedure: ABDOMINAL Ronny Flurry;  Surgeon: Iran Ouch, MD;  Location: [facility] CATH LAB;  Service: Cardiovascular;  Laterality: N/A;  . ABDOMINAL AORTOGRAM W/LOWER EXTREMITY N/A 01/18/2020   Procedure: ABDOMINAL AORTOGRAM W/LOWER EXTREMITY - Right;  Surgeon: Iran Ouch, MD;  Location: [facility] INVASIVE CV LAB;  Service: Cardiovascular;  Laterality: N/A;  . APPENDECTOMY    . COLONOSCOPY WITH PROPOFOL N/A 10/11/2015   Procedure: COLONOSCOPY WITH PROPOFOL;  Surgeon: Charna Elizabeth, MD;  Location: [facility] ENDOSCOPY;  Service: Endoscopy;  Laterality: N/A;  . CORONARY ARTERY BYPASS GRAFT N/A 11/04/2013   Procedure: CORONARY ARTERY BYPASS GRAFTING (CABG) TIMES FOUR  USING LEFT INTERNAL MAMMARY ARTERY AND RIGHT AND LEFT SAPHENOUS LEG VEIN HARVESTED ENDOSCOPICALLY;  Surgeon: Loreli Slot, MD;  Location: [facility] OR;  Service: Open Heart Surgery;  Laterality: N/A;  . CRANIOTOMY Left 06/30/2019   Procedure: Frontal CRANIOTOMY HEMATOMA EVACUATION SUBDURAL;  Surgeon: Lisbeth Renshaw, MD;  Location: [facility] OR;  Service: Neurosurgery;  Laterality: Left;  Frontal CRANIOTOMY HEMATOMA EVACUATION SUBDURAL  . FEMORAL-POPLITEAL BYPASS GRAFT Right 01/20/2020   Procedure: BYPASS GRAFT FEMORAL-POPLITEAL ARTERY RIGHT LEG USING GORE PROPATEN GRAFT;  Surgeon: Nada Libman, MD;  Location: [facility] OR;  Service: Vascular;  Laterality: Right;  . FINGER SURGERY  2017   injury  . I & D EXTREMITY Right 03/02/2020  Procedure: IRRIGATION AND DEBRIDEMENT EXTREMITY right  lower leg  with Antibiotic beads.;  Surgeon: Chuck Hint, MD;  Location: [facility] OR;  Service: Vascular;  Laterality: Right;  . KNEE SURGERY     fractured patella  . LEFT HEART CATH N/A 10/29/2013   Procedure: LEFT HEART CATH;  Surgeon: Kathleene Hazel, MD;  Location[facility] CATH LAB;  Service: Cardiovascular;  Laterality: N/A;  . LEFT HEART CATHETERIZATION WITH CORONARY ANGIOGRAM N/A 10/31/2013   Procedure: LEFT HEART CATHETERIZATION WITH CORONARY ANGIOGRAM;  Surgeon:  Kathleene Hazel, MD;  Location: [facility] CATH LAB;  Service: Cardiovascular;  Laterality: N/A;  . MOUTH SURGERY    . TOE SURGERY      Allergies as of 05/24/2021      Reactions   Ace Inhibitors Swelling   Angioedema   Dairy Aid [lactase]    gas   Eggs Or Egg-derived Products    Cannot eat Prepared Eggs   Latex Itching   Other    Other reaction(s): Unknown      Medication List       Accurate as of May 24, 2021 11:59 PM. If you have any questions, ask your nurse or doctor.        STOP taking these medications   ciprofloxacin 500 MG tablet Commonly known as: CIPRO Stopped by: Willow Ora, MD     TAKE these medications   acetaminophen 325 MG tablet Commonly known as: TYLENOL Take 2 tablets (650 mg total) by mouth 4 (four) times daily -  before meals and at bedtime. What changed: when to take this   aspirin EC 81 MG tablet Take 81 mg by mouth daily.   atorvastatin 80 MG tablet Commonly known as: LIPITOR Take 1 tablet (80 mg total) by mouth daily.   BEN GAY EX Apply 1 application topically as needed (typically right knee).   Calcium Carb-Cholecalciferol 600-800 MG-UNIT Tabs Take 1 tablet by mouth daily.   citalopram 20 MG tablet Commonly known as: CELEXA Take 2 tablets (40 mg total) by mouth daily.   clonazePAM 0.25 MG disintegrating tablet Commonly known as: KLONOPIN DISSOLVE 1 TABLET ON THE  TONGUE TWICE DAILY AS  NEEDED   clopidogrel 75 MG tablet Commonly known as: PLAVIX Take 1 tablet (75 mg total) by mouth daily.   docusate sodium 100 MG capsule Commonly known as: COLACE Take 100 mg by mouth daily.   fluticasone 50 MCG/ACT nasal spray Commonly known as: FLONASE Place 2 sprays into both nostrils daily.   furosemide 20 MG tablet Commonly known as: LASIX Take 1 tablet (20 mg total) by mouth daily as needed (ankle swelling).   gabapentin 100 MG capsule Commonly known as: NEURONTIN Take 1 capsule (100 mg total) by mouth 2 (two) times daily.    irbesartan 300 MG tablet Commonly known as: AVAPRO Take 1 tablet (300 mg total) by mouth daily.   melatonin 3 MG Tabs tablet Take 6 mg by mouth at bedtime.   metoprolol tartrate 25 MG tablet Commonly known as: LOPRESSOR Take 1 tablet (25 mg total) by mouth 2 (two) times daily.   multivitamin with minerals Tabs tablet Take 1 tablet by mouth daily.   nicotine 7 mg/24hr patch Commonly known as: NICODERM CQ - dosed in mg/24 hr Place 1 patch (7 mg total) onto the skin daily.   pantoprazole 40 MG tablet Commonly known as: PROTONIX Take 1 tablet (40 mg total) by mouth at bedtime.   QUEtiapine 200 MG tablet Commonly known as: SEROQUEL Take  1 tablet (200 mg total) by mouth 2 (two) times daily.   tamsulosin 0.4 MG Caps capsule Commonly known as: FLOMAX Take 2 capsules (0.8 mg total) by mouth daily after supper.   traMADol 50 MG tablet Commonly known as: ULTRAM Take 1 tablet (50 mg total) by mouth at bedtime as needed for severe pain. What changed:   when to take this  reasons to take this Changed by: Willow OraJose Trystin Hargrove, MD            Durable Medical Equipment  (From admission, onward)         Start     Ordered   05/24/21 0000  DME Wheelchair manual       Comments: Patient suffers from dementia, TBI which impairs their ability to perform daily activities like walking, showering/batheing  in the home.  A walker will not resolve issue with performing activities of daily living. A wheelchair will allow patient to safely perform daily activities. Patient can safely propel the wheelchair in the home or has a caregiver who can provide assistance. Length of need: Lifetime. Accessories: elevating leg rests (ELRs), wheel locks, extensions and anti-tippers.   05/24/21 1543   05/24/21 0000  For home use only DME Other see comment       [facility] Dx:G31.09, [id number], [phone]  Question:  Length of Need  Answer:  Lifetime   05/24/21 1543             Objective:   Physical  Exam BP 112/78 (BP Location: Left Arm, Patient Position: Sitting, Cuff Size: Small)   Pulse 61   Temp 98.8 F (37.1 C) (Oral)   Resp 18   Ht 6\' 2"  (1.88 m)   SpO2 96%   BMI 26.32 kg/m  General:   Well developed, NAD, BMI noted. HEENT:  Normocephalic . Face symmetric, atraumatic Lungs:  CTA B Normal respiratory effort, no intercostal retractions, no accessory muscle use. Heart: RRR,  no murmur.  Lower extremities: no pretibial edema bilaterally  Skin: Not pale. Not jaundice Neurologic:  alert & oriented X3.  Speech normal, gait not tested, he sits in a wheelchair. Psych--  Cognition and judgment appear intact.  Cooperative with normal attention span and concentration.  Behavior appropriate. No anxious or depressed appearing. He seems in good spirits today     Assessment      Assessment Prediabetes  dx 08-2014 HTN Hyperlipidemia Psych Anxiety: per pt, see OV 09/10/2019 Frontotemporal dementia dx 06/2019 but sx started gradually long before  CV: --CAD, MI , cath 10-2013 ---> CABG --Ischemic cardiomyopathy --Peripheral vascular disease Tobacco abuse -- intolerant to chantix before, tried Wellbutrin 2016 (not much help) Onychomycosis-- rx lamisil per podiatry 12-2015 Subdural hematoma, s/p craniotomy: admited 06/2019     PLAN: Prediabetes: Check A1c HTN: Seems well controlled, recommend to check BPs at home.  Continue Lasix, Avapro, metoprolol.  Check BMP, CBC. Hyperlipidemia: On atorvastatin, check FLP. CAD: Continue present care, reports no chest pain Gait disorder, mobility is quite decreased given his history of peripheral vascular disease, dementia, general debility, cardiomyopathy, DJD, lack of balance, history of TBI with subdural hematoma 2020. Because of the above:  The use of a lift is needed to transfer between bed and chair, wheelchair or commode and without it the patient would be bed confined.     Also, the patient suffers from dementia, TBI which  impairs their ability to perform daily activities like walking, showering/batheing in the home. A walker will not resolve issue with  performing activities of daily living. A wheelchair will allow patient to safely perform daily activities. Patient can safely propel the wheelchair in the home or has a caregiver who can provide assistance   Pain management: Was prescribed tramadol, he was in the hospital back in 2021, he reports that taking occasional tramadol at night helps significantly, prescription sent. PAD: Saw vascular 03/25/2021, for surveillance of R leg bypass arterial duplex shows a widely patent vasculature on the right leg.  Edema noted, was recommended conservative measures and continue aspirin, Plavix. Dementia, frontotemporal: Alert oriented x3, at baseline. Social: household -- patient, his wife, daughter.  He has an aide that comes 3 times a week. RTC 4 months   This visit occurred during the SARS-CoV-2 public health emergency.  Safety protocols were in place, including screening questions prior to the visit, additional usage of staff PPE, and extensive cleaning of exam room while observing appropriate contact time as indicated for disinfecting solutions.

## 2021-05-24 NOTE — Patient Instructions (Addendum)
Please consider COVID shot #4 at the first floor  GO TO THE LAB : Get the blood work     GO TO THE FRONT DESK, PLEASE SCHEDULE YOUR APPOINTMENTS Come back for a checkup in 4 months

## 2021-05-25 NOTE — Assessment & Plan Note (Signed)
Prediabetes: Check A1c HTN: Seems well controlled, recommend to check BPs at home.  Continue Lasix, Avapro, metoprolol.  Check BMP, CBC. Hyperlipidemia: On atorvastatin, check FLP. CAD: Continue present care, reports no chest pain Gait disorder, mobility is quite decreased given his history of peripheral vascular disease, dementia, general debility, cardiomyopathy, DJD, lack of balance, history of TBI with subdural hematoma 2020. Because of the above:  The use of a lift is needed to transfer between bed and chair, wheelchair or commode and without it the patient would be bed confined.     Also, the patient suffers from dementia, TBI which impairs their ability to perform daily activities like walking, showering/batheing in the home. A walker will not resolve issue with performing activities of daily living. A wheelchair will allow patient to safely perform daily activities. Patient can safely propel the wheelchair in the home or has a caregiver who can provide assistance   Pain management: Was prescribed tramadol, he was in the hospital back in 2021, he reports that taking occasional tramadol at night helps significantly, prescription sent. PAD: Saw vascular 03/25/2021, for surveillance of R leg bypass arterial duplex shows a widely patent vasculature on the right leg.  Edema noted, was recommended conservative measures and continue aspirin, Plavix. Dementia, frontotemporal: Alert oriented x3, at baseline. Social: household -- patient, his wife, daughter.  He has an aide that comes 3 times a week. RTC 4 months

## 2021-05-30 ENCOUNTER — Telehealth: Payer: Self-pay

## 2021-05-30 LAB — FERRITIN: Ferritin: 23 ng/mL — ABNORMAL LOW (ref 24–380)

## 2021-05-30 LAB — BASIC METABOLIC PANEL
BUN: 17 mg/dL (ref 7–25)
CO2: 22 mmol/L (ref 20–32)
Calcium: 9.4 mg/dL (ref 8.6–10.3)
Chloride: 104 mmol/L (ref 98–110)
Creat: 0.94 mg/dL (ref 0.70–1.25)
Glucose, Bld: 101 mg/dL — ABNORMAL HIGH (ref 65–99)
Potassium: 4.7 mmol/L (ref 3.5–5.3)
Sodium: 138 mmol/L (ref 135–146)

## 2021-05-30 LAB — TEST AUTHORIZATION

## 2021-05-30 LAB — CBC WITH DIFFERENTIAL/PLATELET
Absolute Monocytes: 722 cells/uL (ref 200–950)
Basophils Absolute: 155 cells/uL (ref 0–200)
Basophils Relative: 1.4 %
Eosinophils Absolute: 1021 cells/uL — ABNORMAL HIGH (ref 15–500)
Eosinophils Relative: 9.2 %
HCT: 29.7 % — ABNORMAL LOW (ref 38.5–50.0)
Hemoglobin: 8.6 g/dL — ABNORMAL LOW (ref 13.2–17.1)
Lymphs Abs: 2420 cells/uL (ref 850–3900)
MCH: 20.5 pg — ABNORMAL LOW (ref 27.0–33.0)
MCHC: 29 g/dL — ABNORMAL LOW (ref 32.0–36.0)
MCV: 70.7 fL — ABNORMAL LOW (ref 80.0–100.0)
MPV: 9.3 fL (ref 7.5–12.5)
Monocytes Relative: 6.5 %
Neutro Abs: 6782 cells/uL (ref 1500–7800)
Neutrophils Relative %: 61.1 %
Platelets: 566 10*3/uL — ABNORMAL HIGH (ref 140–400)
RBC: 4.2 10*6/uL (ref 4.20–5.80)
RDW: 16.4 % — ABNORMAL HIGH (ref 11.0–15.0)
Total Lymphocyte: 21.8 %
WBC: 11.1 10*3/uL — ABNORMAL HIGH (ref 3.8–10.8)

## 2021-05-30 LAB — LIPID PANEL
Cholesterol: 91 mg/dL (ref <200)
HDL: 28 mg/dL — ABNORMAL LOW (ref >=40)
LDL Cholesterol (Calc): 44 mg/dL (calc)
Non-HDL Cholesterol (Calc): 63 mg/dL (calc) (ref <130)
Total CHOL/HDL Ratio: 3.3 (calc) (ref <5.0)
Triglycerides: 106 mg/dL (ref <150)

## 2021-05-30 LAB — HEMOGLOBIN A1C
Hgb A1c MFr Bld: 6.2 % of total Hgb — ABNORMAL HIGH (ref <5.7)
Mean Plasma Glucose: 131 mg/dL
eAG (mmol/L): 7.3 mmol/L

## 2021-05-30 LAB — IRON: Iron: 24 ug/dL — ABNORMAL LOW (ref 50–180)

## 2021-05-30 NOTE — Telephone Encounter (Signed)
TC placed by Palliative Care Volunteer. No answer

## 2021-06-03 ENCOUNTER — Telehealth: Payer: Self-pay

## 2021-06-03 ENCOUNTER — Telehealth: Payer: Self-pay | Admitting: Internal Medicine

## 2021-06-03 ENCOUNTER — Other Ambulatory Visit: Payer: Self-pay

## 2021-06-03 DIAGNOSIS — D509 Iron deficiency anemia, unspecified: Secondary | ICD-10-CM

## 2021-06-03 DIAGNOSIS — I739 Peripheral vascular disease, unspecified: Secondary | ICD-10-CM

## 2021-06-03 MED ORDER — FERROUS FUMARATE 325 (106 FE) MG PO TABS: ORAL_TABLET | ORAL | 6 refills | Status: DC

## 2021-06-03 NOTE — Telephone Encounter (Signed)
-   Anemia with low iron and ferritin. Last colonoscopy, Dr. Loreta Ave, 10/11/2015, + diverticulosis, no polyps.  No AVMs. -A1c very good Above discussed with the patient's daughter, there is no nausea, vomiting or change in the color of the stools. He is still on aspirin and Plavix. Rec to see  GI understanding that he simply may be a poor candidate for endoscopies. Plan: GI referral, Dr. Loreta Ave, please asked to be seen within the next 10 days Iron 325 1 p.o. twice daily, before lunch and before supper.  #60, 6 refills. RTC 2 months

## 2021-06-03 NOTE — Telephone Encounter (Signed)
Patient's daughter called to report that over the past week, patient has developed rest pain and his foot is discolored. Says he was beating on his leg because the pain was so bad. It hurts all the time and is worse at night. Daughter says that skin is breaking down on the foot and leg is very swollen. It is warm. Patient seen in March - due for a follow up in 2023. Daughter says these are the symptoms he had in March, but much worse. She has encouraged him to stop smoking and patient will agree, but due to his dementia forgets he agreed and asks for cigarettes. She would like an appointment to evaluate. He is s/p right fem pop with ptfe in January 2021. Placed on schedule for ABI, RLE graft duplex and follow up.

## 2021-06-03 NOTE — Telephone Encounter (Signed)
Lonnie [name] sent to [facility]. GI referral placed, I've asked in referral notes if he can be seen within the next 10 days.

## 2021-06-04 ENCOUNTER — Telehealth: Payer: Self-pay | Admitting: Internal Medicine

## 2021-06-04 NOTE — Telephone Encounter (Signed)
Try to call patient to schedule a 2 mo appt with Dr. Drue Novel

## 2021-06-06 ENCOUNTER — Ambulatory Visit (INDEPENDENT_AMBULATORY_CARE_PROVIDER_SITE_OTHER): Payer: Medicare Other | Admitting: Physician Assistant

## 2021-06-06 ENCOUNTER — Ambulatory Visit (INDEPENDENT_AMBULATORY_CARE_PROVIDER_SITE_OTHER)
Admission: RE | Admit: 2021-06-06 | Discharge: 2021-06-06 | Disposition: A | Discharge status: Home / Self Care | Payer: Medicare Other | Source: Ambulatory Visit | Attending: Vascular Surgery | Admitting: Vascular Surgery

## 2021-06-06 ENCOUNTER — Other Ambulatory Visit: Payer: Self-pay

## 2021-06-06 VITALS — BP 125/79 | HR 80 | Temp 97.9°F | Resp 14 | Ht 74.0 in | Wt 190.0 lb

## 2021-06-06 DIAGNOSIS — F17210 Nicotine dependence, cigarettes, uncomplicated: Secondary | ICD-10-CM | POA: Diagnosis not present

## 2021-06-06 DIAGNOSIS — I998 Other disorder of circulatory system: Secondary | ICD-10-CM | POA: Diagnosis not present

## 2021-06-06 DIAGNOSIS — I5042 Chronic combined systolic (congestive) and diastolic (congestive) heart failure: Secondary | ICD-10-CM | POA: Diagnosis not present

## 2021-06-06 DIAGNOSIS — K219 Gastro-esophageal reflux disease without esophagitis: Secondary | ICD-10-CM | POA: Diagnosis not present

## 2021-06-06 DIAGNOSIS — K5900 Constipation, unspecified: Secondary | ICD-10-CM | POA: Diagnosis not present

## 2021-06-06 DIAGNOSIS — R296 Repeated falls: Secondary | ICD-10-CM | POA: Diagnosis not present

## 2021-06-06 DIAGNOSIS — Z7902 Long term (current) use of antithrombotics/antiplatelets: Secondary | ICD-10-CM | POA: Diagnosis not present

## 2021-06-06 DIAGNOSIS — T82868A Thrombosis of vascular prosthetic devices, implants and grafts, initial encounter: Secondary | ICD-10-CM | POA: Diagnosis not present

## 2021-06-06 DIAGNOSIS — I7092 Chronic total occlusion of artery of the extremities: Secondary | ICD-10-CM | POA: Diagnosis not present

## 2021-06-06 DIAGNOSIS — Z7401 Bed confinement status: Secondary | ICD-10-CM | POA: Diagnosis not present

## 2021-06-06 DIAGNOSIS — I739 Peripheral vascular disease, unspecified: Secondary | ICD-10-CM

## 2021-06-06 DIAGNOSIS — F0390 Unspecified dementia without behavioral disturbance: Secondary | ICD-10-CM | POA: Diagnosis not present

## 2021-06-06 DIAGNOSIS — Z7982 Long term (current) use of aspirin: Secondary | ICD-10-CM | POA: Diagnosis not present

## 2021-06-06 DIAGNOSIS — I519 Heart disease, unspecified: Secondary | ICD-10-CM | POA: Diagnosis not present

## 2021-06-06 DIAGNOSIS — I252 Old myocardial infarction: Secondary | ICD-10-CM | POA: Diagnosis not present

## 2021-06-06 DIAGNOSIS — D509 Iron deficiency anemia, unspecified: Secondary | ICD-10-CM | POA: Diagnosis not present

## 2021-06-06 DIAGNOSIS — Z743 Need for continuous supervision: Secondary | ICD-10-CM | POA: Diagnosis not present

## 2021-06-06 DIAGNOSIS — T82898A Other specified complication of vascular prosthetic devices, implants and grafts, initial encounter: Secondary | ICD-10-CM | POA: Diagnosis not present

## 2021-06-06 DIAGNOSIS — Z1211 Encounter for screening for malignant neoplasm of colon: Secondary | ICD-10-CM | POA: Diagnosis not present

## 2021-06-06 DIAGNOSIS — I255 Ischemic cardiomyopathy: Secondary | ICD-10-CM | POA: Diagnosis not present

## 2021-06-06 DIAGNOSIS — T827XXA Infection and inflammatory reaction due to other cardiac and vascular devices, implants and grafts, initial encounter: Secondary | ICD-10-CM | POA: Diagnosis not present

## 2021-06-06 DIAGNOSIS — G629 Polyneuropathy, unspecified: Secondary | ICD-10-CM | POA: Diagnosis not present

## 2021-06-06 DIAGNOSIS — I110 Hypertensive heart disease with heart failure: Secondary | ICD-10-CM | POA: Diagnosis not present

## 2021-06-06 DIAGNOSIS — R7303 Prediabetes: Secondary | ICD-10-CM | POA: Diagnosis not present

## 2021-06-06 DIAGNOSIS — R531 Weakness: Secondary | ICD-10-CM | POA: Diagnosis not present

## 2021-06-06 DIAGNOSIS — R2681 Unsteadiness on feet: Secondary | ICD-10-CM | POA: Diagnosis not present

## 2021-06-06 DIAGNOSIS — I2510 Atherosclerotic heart disease of native coronary artery without angina pectoris: Secondary | ICD-10-CM | POA: Diagnosis not present

## 2021-06-06 DIAGNOSIS — Z20822 Contact with and (suspected) exposure to covid-19: Secondary | ICD-10-CM | POA: Diagnosis not present

## 2021-06-06 DIAGNOSIS — Z951 Presence of aortocoronary bypass graft: Secondary | ICD-10-CM | POA: Diagnosis not present

## 2021-06-06 DIAGNOSIS — I6789 Other cerebrovascular disease: Secondary | ICD-10-CM | POA: Diagnosis not present

## 2021-06-06 DIAGNOSIS — Y832 Surgical operation with anastomosis, bypass or graft as the cause of abnormal reaction of the patient, or of later complication, without mention of misadventure at the time of the procedure: Secondary | ICD-10-CM | POA: Diagnosis not present

## 2021-06-06 DIAGNOSIS — I70221 Atherosclerosis of native arteries of extremities with rest pain, right leg: Secondary | ICD-10-CM | POA: Diagnosis not present

## 2021-06-06 DIAGNOSIS — Z79899 Other long term (current) drug therapy: Secondary | ICD-10-CM | POA: Diagnosis not present

## 2021-06-06 LAB — HEPATIC FUNCTION PANEL
ALT: 24 (ref 10–40)
AST: 20 (ref 14–40)
Alkaline Phosphatase: 147 — AB (ref 25–125)
Bilirubin, Direct: 0.19 (ref 0.01–0.4)
Bilirubin, Total: 0.6

## 2021-06-06 LAB — IRON,TIBC AND FERRITIN PANEL
Ferritin: 41
Iron: 12

## 2021-06-06 LAB — COMPREHENSIVE METABOLIC PANEL: Albumin: 4.1 (ref 3.5–5.0)

## 2021-06-06 NOTE — Progress Notes (Addendum)
Peripheral Arterial Disease Follow-Up   VASCULAR SURGERY ASSESSMENT & PLAN:   Lonnie [name] is a 70 y.o. male  status post right femoral to below the knee popliteal bypass with 6 mm external ring propatent PTFE on 01/20/2020 by Dr. Myra Gianotti.  This was secondary to right foot ulcer.  He subsequently underwent exploration and washout of dehisced popliteal incision by Dr. Edilia Bo on 03/02/2020.  He was seen in follow-up on March 25, 2021 in our [place] clinic secondary to right foot and calf pain.  His arterial duplex on that day demonstrated a widely patent right lower extremity bypass graft with biphasic flow to the level of the ankle. He is s/p left SFA and popliteal stenting by Dr. Kirke Corin in 2015.  Duplex today reveals occlusion of right femoral to BK popliteal bypass. Acute ischemic skin changes of dorsum of right foot. Stable left ABI.  Discussed case with Dr. Darrick Penna.  We will plan attempt thrombolysis in the peripheral laboratory tomorrow.  The patient and his wife are in agreement with this plan. SUBJECTIVE:   Presents today with complaints of right lower extremity rest pain and skin discoloration for approximately one week. He ambulates with a rolling walker but has not been able to use this for the past three days due to decrease mobility in foot and ankle. Denies left lower extremity pain or tissue loss. Has been compliant with statin, aspirin and Plavix. No anticoagulants.  PHYSICAL EXAM:   Vitals:   06/06/21 1212  Weight: 190 lb (86.2 kg)  Height: 6\' 2"  (1.88 m)     General appearance: Well-developed, well-nourished in no apparent distress Neurologic: Alert and oriented x 4. Cardiovascular: Heart rate and rhythm are regular.   Extremities: Right lower extremity inspected. Skin sloughing of dorsum of right foot and mild edema and erythema of distal lower leg and foot. Foot is warm. Decreased range of motion. Monophasic right DP and PT Doppler signals.     NON-INVASIVE VASCULAR  STUDIES   06/06/2021 ABIs ABI/TBIToday's ABIToday's TBIPrevious ABIPrevious TBI  +-------+-----------+-----------+------------+------------+  Right  0.26       -          1.15        0.55          +-------+-----------+-----------+------------+------------+  Left   0.58       -          0.54        0.30          +-------+-----------+-----------+------------+------------+  RLE arterial duplex Right: Occluded right femoral to popliteal bypass graft.     PROBLEM LIST:    The patient's past medical history, past surgical history, family history, social history, allergy list and medication list are reviewed.   CURRENT MEDS:    Current Outpatient Medications:    acetaminophen (TYLENOL) 325 MG tablet, Take 2 tablets (650 mg total) by mouth 4 (four) times daily -  before meals and at bedtime. (Patient taking differently: Take 650 mg by mouth 2 (two) times daily.), Disp: , Rfl:    aspirin EC 81 MG tablet, Take 81 mg by mouth daily., Disp: , Rfl:    atorvastatin (LIPITOR) 80 MG tablet, Take 1 tablet (80 mg total) by mouth daily., Disp: 90 tablet, Rfl: 1   Calcium Carb-Cholecalciferol 600-800 MG-UNIT TABS, Take 1 tablet by mouth daily., Disp: , Rfl:    citalopram (CELEXA) 20 MG tablet, Take 2 tablets (40 mg total) by mouth daily., Disp: 180 tablet, Rfl: 0  clonazePAM (KLONOPIN) 0.25 MG disintegrating tablet, DISSOLVE 1 TABLET ON THE  TONGUE TWICE DAILY AS  NEEDED, Disp: 180 tablet, Rfl: 0   clopidogrel (PLAVIX) 75 MG tablet, Take 1 tablet (75 mg total) by mouth daily., Disp: 90 tablet, Rfl: 3   docusate sodium (COLACE) 100 MG capsule, Take 100 mg by mouth daily., Disp: , Rfl:    ferrous fumarate (HEMOCYTE - 106 MG FE) 325 (106 Fe) MG TABS tablet, Take 1 tablet by mouth before lunch and before dinner, Disp: 60 tablet, Rfl: 6   fluticasone (FLONASE) 50 MCG/ACT nasal spray, Place 2 sprays into both nostrils daily., Disp: , Rfl: 2   furosemide (LASIX) 20 MG tablet, Take 1 tablet  (20 mg total) by mouth daily as needed (ankle swelling)., Disp: 90 tablet, Rfl: 0   gabapentin (NEURONTIN) 100 MG capsule, Take 1 capsule (100 mg total) by mouth 2 (two) times daily., Disp: 180 capsule, Rfl: 1   irbesartan (AVAPRO) 300 MG tablet, Take 1 tablet (300 mg total) by mouth daily., Disp: 90 tablet, Rfl: 1   Liniments (BEN GAY EX), Apply 1 application topically as needed (typically right knee)., Disp: , Rfl:    Melatonin 3 MG TABS, Take 6 mg by mouth at bedtime. , Disp: , Rfl:    metoprolol tartrate (LOPRESSOR) 25 MG tablet, Take 1 tablet (25 mg total) by mouth 2 (two) times daily., Disp: 180 tablet, Rfl: 1   Multiple Vitamin (MULTIVITAMIN WITH MINERALS) TABS tablet, Take 1 tablet by mouth daily., Disp: , Rfl:    nicotine (NICODERM CQ - DOSED IN MG/24 HR) 7 mg/24hr patch, Place 1 patch (7 mg total) onto the skin daily. (Patient not taking: Reported on 05/24/2021), Disp: 28 patch, Rfl: 0   pantoprazole (PROTONIX) 40 MG tablet, Take 1 tablet (40 mg total) by mouth at bedtime., Disp: 90 tablet, Rfl: 3   QUEtiapine (SEROQUEL) 200 MG tablet, Take 1 tablet (200 mg total) by mouth 2 (two) times daily., Disp: 180 tablet, Rfl: 1   tamsulosin (FLOMAX) 0.4 MG CAPS capsule, Take 2 capsules (0.8 mg total) by mouth daily after supper., Disp: 180 capsule, Rfl: 3   traMADol (ULTRAM) 50 MG tablet, Take 1 tablet (50 mg total) by mouth at bedtime as needed for severe pain., Disp: 30 tablet, Rfl: 0   REVIEW OF SYSTEMS:   [X]  denotes positive finding, [ ]  denotes negative finding Cardiac  Comments:  Chest pain or chest pressure:    Shortness of breath upon exertion:    Short of breath when lying flat:    Irregular heart rhythm:        Vascular    Pain in calf, thigh, or hip brought on by ambulation:    Pain in feet at night that wakes you up from your sleep:     Blood clot in your veins:    Leg swelling:         Pulmonary    Oxygen at home:    Productive cough:     Wheezing:         Neurologic     Sudden weakness in arms or legs:     Sudden numbness in arms or legs:     Sudden onset of difficulty speaking or slurred speech:    Temporary loss of vision in one eye:     Problems with dizziness:         Gastrointestinal    Blood in stool:     Vomited blood:  Genitourinary    Burning when urinating:     Blood in urine:        Psychiatric    Major depression:         Hematologic    Bleeding problems:    Problems with blood clotting too easily:        Skin    Rashes or ulcers:        Constitutional    Fever or chills:     Milinda Antis, PA-C  Office: [phone] 06/06/2021  Clinic MD: Dr. Darrick Penna

## 2021-06-06 NOTE — H&P (View-Only) (Signed)
Peripheral Arterial Disease Follow-Up   VASCULAR SURGERY ASSESSMENT & PLAN:   Lonnie [name] is a 70 y.o. male  status post right femoral to below the knee popliteal bypass with 6 mm external ring propatent PTFE on 01/20/2020 by Dr. Myra Gianotti.  This was secondary to right foot ulcer.  He subsequently underwent exploration and washout of dehisced popliteal incision by Dr. Edilia Bo on 03/02/2020.  He was seen in follow-up on March 25, 2021 in our [place] clinic secondary to right foot and calf pain.  His arterial duplex on that day demonstrated a widely patent right lower extremity bypass graft with biphasic flow to the level of the ankle. He is s/p left SFA and popliteal stenting by Dr. Kirke Corin in 2015.  Duplex today reveals occlusion of right femoral to BK popliteal bypass. Acute ischemic skin changes of dorsum of right foot. Stable left ABI.  Discussed case with Dr. Darrick Penna.  We will plan attempt thrombolysis in the peripheral laboratory tomorrow.  The patient and his wife are in agreement with this plan. SUBJECTIVE:   Presents today with complaints of right lower extremity rest pain and skin discoloration for approximately one week. He ambulates with a rolling walker but has not been able to use this for the past three days due to decrease mobility in foot and ankle. Denies left lower extremity pain or tissue loss. Has been compliant with statin, aspirin and Plavix. No anticoagulants.  PHYSICAL EXAM:   Vitals:   06/06/21 1212  Weight: 190 lb (86.2 kg)  Height: 6\' 2"  (1.88 m)     General appearance: Well-developed, well-nourished in no apparent distress Neurologic: Alert and oriented x 4. Cardiovascular: Heart rate and rhythm are regular.   Extremities: Right lower extremity inspected. Skin sloughing of dorsum of right foot and mild edema and erythema of distal lower leg and foot. Foot is warm. Decreased range of motion. Monophasic right DP and PT Doppler signals.     NON-INVASIVE VASCULAR  STUDIES   06/06/2021 ABIs ABI/TBIToday's ABIToday's TBIPrevious ABIPrevious TBI  +-------+-----------+-----------+------------+------------+  Right  0.26       -          1.15        0.55          +-------+-----------+-----------+------------+------------+  Left   0.58       -          0.54        0.30          +-------+-----------+-----------+------------+------------+  RLE arterial duplex Right: Occluded right femoral to popliteal bypass graft.     PROBLEM LIST:    The patient's past medical history, past surgical history, family history, social history, allergy list and medication list are reviewed.   CURRENT MEDS:    Current Outpatient Medications:    acetaminophen (TYLENOL) 325 MG tablet, Take 2 tablets (650 mg total) by mouth 4 (four) times daily -  before meals and at bedtime. (Patient taking differently: Take 650 mg by mouth 2 (two) times daily.), Disp: , Rfl:    aspirin EC 81 MG tablet, Take 81 mg by mouth daily., Disp: , Rfl:    atorvastatin (LIPITOR) 80 MG tablet, Take 1 tablet (80 mg total) by mouth daily., Disp: 90 tablet, Rfl: 1   Calcium Carb-Cholecalciferol 600-800 MG-UNIT TABS, Take 1 tablet by mouth daily., Disp: , Rfl:    citalopram (CELEXA) 20 MG tablet, Take 2 tablets (40 mg total) by mouth daily., Disp: 180 tablet, Rfl: 0  clonazePAM (KLONOPIN) 0.25 MG disintegrating tablet, DISSOLVE 1 TABLET ON THE  TONGUE TWICE DAILY AS  NEEDED, Disp: 180 tablet, Rfl: 0   clopidogrel (PLAVIX) 75 MG tablet, Take 1 tablet (75 mg total) by mouth daily., Disp: 90 tablet, Rfl: 3   docusate sodium (COLACE) 100 MG capsule, Take 100 mg by mouth daily., Disp: , Rfl:    ferrous fumarate (HEMOCYTE - 106 MG FE) 325 (106 Fe) MG TABS tablet, Take 1 tablet by mouth before lunch and before dinner, Disp: 60 tablet, Rfl: 6   fluticasone (FLONASE) 50 MCG/ACT nasal spray, Place 2 sprays into both nostrils daily., Disp: , Rfl: 2   furosemide (LASIX) 20 MG tablet, Take 1 tablet  (20 mg total) by mouth daily as needed (ankle swelling)., Disp: 90 tablet, Rfl: 0   gabapentin (NEURONTIN) 100 MG capsule, Take 1 capsule (100 mg total) by mouth 2 (two) times daily., Disp: 180 capsule, Rfl: 1   irbesartan (AVAPRO) 300 MG tablet, Take 1 tablet (300 mg total) by mouth daily., Disp: 90 tablet, Rfl: 1   Liniments (BEN GAY EX), Apply 1 application topically as needed (typically right knee)., Disp: , Rfl:    Melatonin 3 MG TABS, Take 6 mg by mouth at bedtime. , Disp: , Rfl:    metoprolol tartrate (LOPRESSOR) 25 MG tablet, Take 1 tablet (25 mg total) by mouth 2 (two) times daily., Disp: 180 tablet, Rfl: 1   Multiple Vitamin (MULTIVITAMIN WITH MINERALS) TABS tablet, Take 1 tablet by mouth daily., Disp: , Rfl:    nicotine (NICODERM CQ - DOSED IN MG/24 HR) 7 mg/24hr patch, Place 1 patch (7 mg total) onto the skin daily. (Patient not taking: Reported on 05/24/2021), Disp: 28 patch, Rfl: 0   pantoprazole (PROTONIX) 40 MG tablet, Take 1 tablet (40 mg total) by mouth at bedtime., Disp: 90 tablet, Rfl: 3   QUEtiapine (SEROQUEL) 200 MG tablet, Take 1 tablet (200 mg total) by mouth 2 (two) times daily., Disp: 180 tablet, Rfl: 1   tamsulosin (FLOMAX) 0.4 MG CAPS capsule, Take 2 capsules (0.8 mg total) by mouth daily after supper., Disp: 180 capsule, Rfl: 3   traMADol (ULTRAM) 50 MG tablet, Take 1 tablet (50 mg total) by mouth at bedtime as needed for severe pain., Disp: 30 tablet, Rfl: 0   REVIEW OF SYSTEMS:   [X]  denotes positive finding, [ ]  denotes negative finding Cardiac  Comments:  Chest pain or chest pressure:    Shortness of breath upon exertion:    Short of breath when lying flat:    Irregular heart rhythm:        Vascular    Pain in calf, thigh, or hip brought on by ambulation:    Pain in feet at night that wakes you up from your sleep:     Blood clot in your veins:    Leg swelling:         Pulmonary    Oxygen at home:    Productive cough:     Wheezing:         Neurologic     Sudden weakness in arms or legs:     Sudden numbness in arms or legs:     Sudden onset of difficulty speaking or slurred speech:    Temporary loss of vision in one eye:     Problems with dizziness:         Gastrointestinal    Blood in stool:     Vomited blood:  Genitourinary    Burning when urinating:     Blood in urine:        Psychiatric    Major depression:         Hematologic    Bleeding problems:    Problems with blood clotting too easily:        Skin    Rashes or ulcers:        Constitutional    Fever or chills:     Milinda Antis, PA-C  Office: [phone] 06/06/2021  Clinic MD: Dr. Darrick Penna

## 2021-06-07 ENCOUNTER — Inpatient Hospital Stay (HOSPITAL_COMMUNITY)
Admission: AD | Disposition: A | Discharge status: Home Health | Payer: Self-pay | Source: Ambulatory Visit | Attending: Surgery

## 2021-06-07 ENCOUNTER — Inpatient Hospital Stay (HOSPITAL_COMMUNITY)
Admission: AD | Admit: 2021-06-07 | Discharge: 2021-06-14 | DRG: 271 | Disposition: A | Discharge status: Home Health | Payer: Medicare Other | Attending: Surgery | Admitting: Surgery

## 2021-06-07 DIAGNOSIS — R296 Repeated falls: Secondary | ICD-10-CM | POA: Diagnosis present

## 2021-06-07 DIAGNOSIS — R7303 Prediabetes: Secondary | ICD-10-CM | POA: Diagnosis present

## 2021-06-07 DIAGNOSIS — G629 Polyneuropathy, unspecified: Secondary | ICD-10-CM | POA: Diagnosis present

## 2021-06-07 DIAGNOSIS — I739 Peripheral vascular disease, unspecified: Secondary | ICD-10-CM | POA: Diagnosis present

## 2021-06-07 DIAGNOSIS — Z20822 Contact with and (suspected) exposure to covid-19: Secondary | ICD-10-CM | POA: Diagnosis present

## 2021-06-07 DIAGNOSIS — T82868A Thrombosis of vascular prosthetic devices, implants and grafts, initial encounter: Secondary | ICD-10-CM | POA: Diagnosis present

## 2021-06-07 DIAGNOSIS — Y832 Surgical operation with anastomosis, bypass or graft as the cause of abnormal reaction of the patient, or of later complication, without mention of misadventure at the time of the procedure: Secondary | ICD-10-CM | POA: Diagnosis present

## 2021-06-07 DIAGNOSIS — I7092 Chronic total occlusion of artery of the extremities: Secondary | ICD-10-CM | POA: Diagnosis not present

## 2021-06-07 DIAGNOSIS — I252 Old myocardial infarction: Secondary | ICD-10-CM

## 2021-06-07 DIAGNOSIS — I70221 Atherosclerosis of native arteries of extremities with rest pain, right leg: Secondary | ICD-10-CM | POA: Diagnosis not present

## 2021-06-07 DIAGNOSIS — Z951 Presence of aortocoronary bypass graft: Secondary | ICD-10-CM

## 2021-06-07 DIAGNOSIS — Z7982 Long term (current) use of aspirin: Secondary | ICD-10-CM

## 2021-06-07 DIAGNOSIS — I2510 Atherosclerotic heart disease of native coronary artery without angina pectoris: Secondary | ICD-10-CM | POA: Diagnosis present

## 2021-06-07 DIAGNOSIS — T82898A Other specified complication of vascular prosthetic devices, implants and grafts, initial encounter: Secondary | ICD-10-CM | POA: Diagnosis present

## 2021-06-07 DIAGNOSIS — F0390 Unspecified dementia without behavioral disturbance: Secondary | ICD-10-CM | POA: Diagnosis present

## 2021-06-07 DIAGNOSIS — F17210 Nicotine dependence, cigarettes, uncomplicated: Secondary | ICD-10-CM | POA: Diagnosis present

## 2021-06-07 DIAGNOSIS — Z79899 Other long term (current) drug therapy: Secondary | ICD-10-CM

## 2021-06-07 DIAGNOSIS — I110 Hypertensive heart disease with heart failure: Secondary | ICD-10-CM | POA: Diagnosis present

## 2021-06-07 DIAGNOSIS — I5042 Chronic combined systolic (congestive) and diastolic (congestive) heart failure: Secondary | ICD-10-CM | POA: Diagnosis present

## 2021-06-07 DIAGNOSIS — I255 Ischemic cardiomyopathy: Secondary | ICD-10-CM | POA: Diagnosis present

## 2021-06-07 DIAGNOSIS — Z7902 Long term (current) use of antithrombotics/antiplatelets: Secondary | ICD-10-CM

## 2021-06-07 DIAGNOSIS — T827XXA Infection and inflammatory reaction due to other cardiac and vascular devices, implants and grafts, initial encounter: Secondary | ICD-10-CM | POA: Diagnosis not present

## 2021-06-07 HISTORY — PX: PERIPHERAL VASCULAR THROMBECTOMY: CATH118306

## 2021-06-07 HISTORY — PX: LOWER EXTREMITY ANGIOGRAPHY: CATH118251

## 2021-06-07 LAB — CBC
HCT: 26.5 % — ABNORMAL LOW (ref 39.0–52.0)
HCT: 29.6 % — ABNORMAL LOW (ref 39.0–52.0)
Hemoglobin: 7.8 g/dL — ABNORMAL LOW (ref 13.0–17.0)
Hemoglobin: 8.8 g/dL — ABNORMAL LOW (ref 13.0–17.0)
MCH: 21.3 pg — ABNORMAL LOW (ref 26.0–34.0)
MCH: 21.3 pg — ABNORMAL LOW (ref 26.0–34.0)
MCHC: 29.4 g/dL — ABNORMAL LOW (ref 30.0–36.0)
MCHC: 29.7 g/dL — ABNORMAL LOW (ref 30.0–36.0)
MCV: 72.2 fL — ABNORMAL LOW (ref 80.0–100.0)
MCV: 71.7 fL — ABNORMAL LOW (ref 80.0–100.0)
Platelets: 420 10*3/uL — ABNORMAL HIGH (ref 150–400)
Platelets: 332 10*3/uL (ref 150–400)
RBC: 3.67 MIL/uL — ABNORMAL LOW (ref 4.22–5.81)
RBC: 4.13 MIL/uL — ABNORMAL LOW (ref 4.22–5.81)
RDW: 18.2 % — ABNORMAL HIGH (ref 11.5–15.5)
RDW: 18.1 % — ABNORMAL HIGH (ref 11.5–15.5)
WBC: 18 10*3/uL — ABNORMAL HIGH (ref 4.0–10.5)
WBC: 21.9 10*3/uL — ABNORMAL HIGH (ref 4.0–10.5)
nRBC: 0 % (ref 0.0–0.2)
nRBC: 0 % (ref 0.0–0.2)

## 2021-06-07 LAB — POCT I-STAT, CHEM 8
BUN: 19 mg/dL (ref 8–23)
Calcium, Ion: 1.21 mmol/L (ref 1.15–1.40)
Chloride: 103 mmol/L (ref 98–111)
Creatinine, Ser: 1 mg/dL (ref 0.61–1.24)
Glucose, Bld: 120 mg/dL — ABNORMAL HIGH (ref 70–99)
HCT: 26 % — ABNORMAL LOW (ref 39.0–52.0)
Hemoglobin: 8.8 g/dL — ABNORMAL LOW (ref 13.0–17.0)
Potassium: 4.6 mmol/L (ref 3.5–5.1)
Sodium: 137 mmol/L (ref 135–145)
TCO2: 26 mmol/L (ref 22–32)

## 2021-06-07 LAB — MRSA PCR SCREENING: MRSA by PCR: POSITIVE — AB

## 2021-06-07 LAB — HEPARIN LEVEL (UNFRACTIONATED)
Heparin Unfractionated: 0.11 IU/mL — ABNORMAL LOW (ref 0.30–0.70)
Heparin Unfractionated: <0.1 IU/mL — ABNORMAL LOW (ref 0.30–0.70)

## 2021-06-07 LAB — FIBRINOGEN
Fibrinogen: 662 mg/dL — ABNORMAL HIGH (ref 210–475)
Fibrinogen: 126 mg/dL — ABNORMAL LOW (ref 210–475)

## 2021-06-07 LAB — SARS CORONAVIRUS 2 (TAT 6-24 HRS): SARS Coronavirus 2: NEGATIVE

## 2021-06-07 SURGERY — PERIPHERAL VASCULAR THROMBECTOMY
Anesthesia: LOCAL | Laterality: Right

## 2021-06-07 MED ORDER — ACETAMINOPHEN 325 MG PO TABS: 650.0000 mg | ORAL_TABLET | ORAL | Status: DC | PRN

## 2021-06-07 MED ORDER — SODIUM CHLORIDE 0.9% FLUSH
3.0000 mL | INTRAVENOUS | Status: DC | PRN
  Administered 2021-06-12: 3 mL via INTRAVENOUS

## 2021-06-07 MED ORDER — CALCIUM CARBONATE-VITAMIN D 500-200 MG-UNIT PO TABS
1.0000 | ORAL_TABLET | Freq: Every day | ORAL | Status: DC
  Administered 2021-06-09 – 2021-06-14 (×6): 1 via ORAL
  Filled 2021-06-07 (×6): qty 1

## 2021-06-07 MED ORDER — HEPARIN SODIUM (PORCINE) 1000 UNIT/ML IJ SOLN
INTRAMUSCULAR | Status: AC
  Filled 2021-06-07: qty 1

## 2021-06-07 MED ORDER — PANTOPRAZOLE SODIUM 40 MG PO TBEC
40.0000 mg | DELAYED_RELEASE_TABLET | Freq: Every day | ORAL | Status: DC
  Administered 2021-06-07 – 2021-06-13 (×8): 40 mg via ORAL
  Filled 2021-06-07 (×8): qty 1

## 2021-06-07 MED ORDER — LABETALOL HCL 5 MG/ML IV SOLN: 10.0000 mg | INTRAVENOUS | Status: DC | PRN

## 2021-06-07 MED ORDER — CHLORHEXIDINE GLUCONATE CLOTH 2 % EX PADS
6.0000 | MEDICATED_PAD | Freq: Every day | CUTANEOUS | Status: DC
  Administered 2021-06-08: 6 via TOPICAL

## 2021-06-07 MED ORDER — FAMOTIDINE 20 MG PO TABS
20.0000 mg | ORAL_TABLET | Freq: Two times a day (BID) | ORAL | Status: DC
  Administered 2021-06-07 – 2021-06-14 (×14): 20 mg via ORAL
  Filled 2021-06-07 (×14): qty 1

## 2021-06-07 MED ORDER — ACETAMINOPHEN 500 MG PO TABS
1000.0000 mg | ORAL_TABLET | Freq: Two times a day (BID) | ORAL | Status: DC
  Administered 2021-06-07 – 2021-06-14 (×13): 1000 mg via ORAL
  Filled 2021-06-07 (×13): qty 2

## 2021-06-07 MED ORDER — NICOTINE 7 MG/24HR TD PT24
7.0000 mg | MEDICATED_PATCH | Freq: Every day | TRANSDERMAL | Status: DC
  Administered 2021-06-07 – 2021-06-14 (×7): 7 mg via TRANSDERMAL
  Filled 2021-06-07 (×11): qty 1

## 2021-06-07 MED ORDER — MORPHINE SULFATE (PF) 4 MG/ML IV SOLN
5.0000 mg | INTRAVENOUS | Status: DC | PRN
  Administered 2021-06-07 – 2021-06-08 (×3): 5 mg via INTRAVENOUS
  Filled 2021-06-07 (×3): qty 2

## 2021-06-07 MED ORDER — QUETIAPINE FUMARATE 100 MG PO TABS
200.0000 mg | ORAL_TABLET | Freq: Two times a day (BID) | ORAL | Status: DC
  Administered 2021-06-07: 200 mg via ORAL
  Filled 2021-06-07 (×2): qty 1
  Filled 2021-06-07: qty 2

## 2021-06-07 MED ORDER — SODIUM CHLORIDE 0.9 % IV SOLN: INTRAVENOUS | Status: AC

## 2021-06-07 MED ORDER — ADULT MULTIVITAMIN W/MINERALS CH
1.0000 | ORAL_TABLET | Freq: Every day | ORAL | Status: DC
  Administered 2021-06-09 – 2021-06-14 (×6): 1 via ORAL
  Filled 2021-06-07 (×6): qty 1

## 2021-06-07 MED ORDER — IODIXANOL 320 MG/ML IV SOLN
INTRAVENOUS | Status: DC | PRN
  Administered 2021-06-07: 100 mL

## 2021-06-07 MED ORDER — MUPIROCIN 2 % EX OINT
1.0000 "application " | TOPICAL_OINTMENT | Freq: Two times a day (BID) | CUTANEOUS | Status: AC
  Administered 2021-06-07 – 2021-06-12 (×9): 1 via NASAL
  Filled 2021-06-07 (×3): qty 22

## 2021-06-07 MED ORDER — OXYCODONE HCL 5 MG PO TABS
5.0000 mg | ORAL_TABLET | ORAL | Status: DC | PRN
  Administered 2021-06-07 – 2021-06-08 (×2): 10 mg via ORAL
  Filled 2021-06-07 (×2): qty 2

## 2021-06-07 MED ORDER — LIDOCAINE HCL (PF) 1 % IJ SOLN
INTRAMUSCULAR | Status: AC
  Filled 2021-06-07: qty 30

## 2021-06-07 MED ORDER — SODIUM CHLORIDE 0.9 % IV SOLN
0.8000 mg/h | INTRAVENOUS | Status: DC
  Administered 2021-06-07: 1 mg/h
  Administered 2021-06-08: 0.8 mg/h
  Filled 2021-06-07 (×6): qty 10

## 2021-06-07 MED ORDER — SODIUM CHLORIDE 0.9 % IV SOLN: INTRAVENOUS | Status: DC

## 2021-06-07 MED ORDER — ASPIRIN EC 81 MG PO TBEC: 81.0000 mg | DELAYED_RELEASE_TABLET | Freq: Every day | ORAL | Status: DC

## 2021-06-07 MED ORDER — FENTANYL CITRATE (PF) 100 MCG/2ML IJ SOLN
INTRAMUSCULAR | Status: DC | PRN
  Administered 2021-06-07: 25 ug via INTRAVENOUS

## 2021-06-07 MED ORDER — FERROUS FUMARATE 325 (106 FE) MG PO TABS: 1.0000 | ORAL_TABLET | Freq: Two times a day (BID) | ORAL | Status: DC

## 2021-06-07 MED ORDER — IRBESARTAN 300 MG PO TABS
300.0000 mg | ORAL_TABLET | Freq: Every day | ORAL | Status: DC
  Administered 2021-06-09 – 2021-06-14 (×6): 300 mg via ORAL
  Filled 2021-06-07 (×8): qty 1

## 2021-06-07 MED ORDER — SODIUM CHLORIDE 0.9% FLUSH
3.0000 mL | Freq: Two times a day (BID) | INTRAVENOUS | Status: DC
  Administered 2021-06-07 – 2021-06-13 (×8): 3 mL via INTRAVENOUS

## 2021-06-07 MED ORDER — MELATONIN 3 MG PO TABS
6.0000 mg | ORAL_TABLET | Freq: Every day | ORAL | Status: DC
  Administered 2021-06-07 – 2021-06-13 (×7): 6 mg via ORAL
  Filled 2021-06-07 (×7): qty 2

## 2021-06-07 MED ORDER — FENTANYL CITRATE (PF) 100 MCG/2ML IJ SOLN
INTRAMUSCULAR | Status: AC
  Filled 2021-06-07: qty 2

## 2021-06-07 MED ORDER — HEPARIN (PORCINE) 25000 UT/250ML-% IV SOLN
1000.0000 [IU]/h | INTRAVENOUS | Status: DC
  Administered 2021-06-07: 1000 [IU]/h via INTRAVENOUS
  Filled 2021-06-07: qty 250

## 2021-06-07 MED ORDER — FUROSEMIDE 20 MG PO TABS
20.0000 mg | ORAL_TABLET | Freq: Every day | ORAL | Status: DC
  Administered 2021-06-08 – 2021-06-14 (×7): 20 mg via ORAL
  Filled 2021-06-07 (×8): qty 1

## 2021-06-07 MED ORDER — SODIUM CHLORIDE 0.9% FLUSH: 3.0000 mL | INTRAVENOUS | Status: DC | PRN

## 2021-06-07 MED ORDER — METOPROLOL TARTRATE 25 MG PO TABS
25.0000 mg | ORAL_TABLET | Freq: Two times a day (BID) | ORAL | Status: DC
  Administered 2021-06-07 – 2021-06-14 (×13): 25 mg via ORAL
  Filled 2021-06-07 (×13): qty 1

## 2021-06-07 MED ORDER — CALCIUM CARB-CHOLECALCIFEROL 600-800 MG-UNIT PO TABS: 1.0000 | ORAL_TABLET | Freq: Every day | ORAL | Status: DC

## 2021-06-07 MED ORDER — HEPARIN (PORCINE) IN NACL 1000-0.9 UT/500ML-% IV SOLN
INTRAVENOUS | Status: DC | PRN
  Administered 2021-06-07 (×2): 500 mL

## 2021-06-07 MED ORDER — ONDANSETRON HCL 4 MG/2ML IJ SOLN: 4.0000 mg | Freq: Four times a day (QID) | INTRAMUSCULAR | Status: DC | PRN

## 2021-06-07 MED ORDER — SODIUM CHLORIDE 0.9% FLUSH
3.0000 mL | Freq: Two times a day (BID) | INTRAVENOUS | Status: DC
  Administered 2021-06-07 – 2021-06-14 (×10): 3 mL via INTRAVENOUS

## 2021-06-07 MED ORDER — SODIUM CHLORIDE 0.9 % IV SOLN: 250.0000 mL | INTRAVENOUS | Status: DC | PRN

## 2021-06-07 MED ORDER — TAMSULOSIN HCL 0.4 MG PO CAPS
0.8000 mg | ORAL_CAPSULE | Freq: Every evening | ORAL | Status: DC
  Administered 2021-06-07 – 2021-06-13 (×7): 0.8 mg via ORAL
  Filled 2021-06-07 (×9): qty 2

## 2021-06-07 MED ORDER — MORPHINE SULFATE (PF) 2 MG/ML IV SOLN: 2.0000 mg | INTRAVENOUS | Status: DC | PRN

## 2021-06-07 MED ORDER — ATORVASTATIN CALCIUM 80 MG PO TABS
80.0000 mg | ORAL_TABLET | Freq: Every evening | ORAL | Status: DC
  Administered 2021-06-07 – 2021-06-13 (×7): 80 mg via ORAL
  Filled 2021-06-07 (×8): qty 1

## 2021-06-07 MED ORDER — CITALOPRAM HYDROBROMIDE 20 MG PO TABS
20.0000 mg | ORAL_TABLET | Freq: Two times a day (BID) | ORAL | Status: DC
  Administered 2021-06-07 – 2021-06-14 (×13): 20 mg via ORAL
  Filled 2021-06-07 (×13): qty 1

## 2021-06-07 MED ORDER — CLONAZEPAM 0.25 MG PO TBDP
0.2500 mg | ORAL_TABLET | Freq: Two times a day (BID) | ORAL | Status: DC
  Administered 2021-06-07 – 2021-06-14 (×13): 0.25 mg via ORAL
  Filled 2021-06-07 (×13): qty 1

## 2021-06-07 MED ORDER — HEPARIN (PORCINE) IN NACL 1000-0.9 UT/500ML-% IV SOLN
INTRAVENOUS | Status: AC
  Filled 2021-06-07: qty 1000

## 2021-06-07 MED ORDER — HYDRALAZINE HCL 20 MG/ML IJ SOLN: 5.0000 mg | INTRAMUSCULAR | Status: DC | PRN

## 2021-06-07 MED ORDER — LABETALOL HCL 5 MG/ML IV SOLN
10.0000 mg | INTRAVENOUS | Status: DC | PRN
  Filled 2021-06-07: qty 4

## 2021-06-07 MED ORDER — DOCUSATE SODIUM 100 MG PO CAPS
100.0000 mg | ORAL_CAPSULE | Freq: Every day | ORAL | Status: DC
  Administered 2021-06-08 – 2021-06-14 (×7): 100 mg via ORAL
  Filled 2021-06-07 (×7): qty 1

## 2021-06-07 SURGICAL SUPPLY — 11 items
CATH ANGIO 5F BER2 65CM (CATHETERS) ×1 IMPLANT
CATH ANGIO 5F PIGTAIL 65CM (CATHETERS) ×1 IMPLANT
CATH CROSS OVER TEMPO 5F (CATHETERS) ×1 IMPLANT
CATH INFUS 135CMX50CM (CATHETERS) ×1 IMPLANT
CATH STRAIGHT 5FR 65CM (CATHETERS) ×1 IMPLANT
GUIDEWIRE ANGLED .035X260CM (WIRE) ×1 IMPLANT
SHEATH PINNACLE 5F 10CM (SHEATH) ×1 IMPLANT
SHEATH PINNACLE ST 6F 45CM (SHEATH) ×1 IMPLANT
SHEATH PROBE COVER 6X72 (BAG) ×1 IMPLANT
WIRE HITORQ VERSACORE ST 145CM (WIRE) ×1 IMPLANT
WIRE ROSEN-J .035X260CM (WIRE) ×1 IMPLANT

## 2021-06-07 NOTE — Plan of Care (Signed)

## 2021-06-07 NOTE — Progress Notes (Signed)
Orthopedic Tech Progress Note Patient Details:  DOMNIQUE VANTINE [birth year] [id number]  Applied knee immobilizer only to LLE instead of BLE as instructed by Rocky Link, RN.  Ortho Devices Type of Ortho Device: Knee Immobilizer Ortho Device/Splint Location: LLE Ortho Device/Splint Interventions: Ordered, Application, Adjustment   Post Interventions Patient Tolerated: Well Instructions Provided: Care of device  Shreshta Medley Carmine Savoy 06/07/2021, 3:26 PM

## 2021-06-07 NOTE — Plan of Care (Signed)

## 2021-06-07 NOTE — Progress Notes (Addendum)
ANTICOAGULATION CONSULT NOTE   Pharmacy Consult for heparin Indication:  occluded fem-pop  Allergies  Allergen Reactions   Ace Inhibitors Swelling    Angioedema   Dairy Aid [Lactase]     gas   Eggs Or Egg-Derived Products     Cannot eat Prepared Eggs   Latex Itching    Patient Measurements: Height: 6\' 2"  (188 cm) Weight: 86.2 kg (190 lb) IBW/kg (Calculated) : 82.2 Heparin Dosing Weight: 86kg  Vital Signs: Temp: 98.7 F (37.1 C) (06/17 0909) Temp Source: Oral (06/17 0909) BP: 116/58 (06/17 0909) Pulse Rate: 66 (06/17 0909)  Labs: Recent Labs    06/07/21 1025  HGB 8.8*  HCT 26.0*  CREATININE 1.00    Estimated Creatinine Clearance: 82.2 mL/min (by C-G formula based on SCr of 1 mg/dL).   Medical History: Past Medical History:  Diagnosis Date   Allergy    Anxiety    Arthritis    left neck, shoulder, knee   CAD (coronary artery disease)    a. anterior STEMI 10/2013 s/p 4V CABG with LIMA to mid LAD, SVG to OM, SVG to PDA, SVG to Diagonal.   Chronic combined systolic and diastolic CHF (congestive heart failure) (HCC)    Dementia (HCC)    Depression    Falls    Hypertension    Ischemic cardiomyopathy    a. EF 40-45% at time of CABG and in 2018.   MI (myocardial infarction) (HCC)    Peripheral neuropathy    Prediabetes 09/01/2014   PVD (peripheral vascular disease) (HCC)    a. s/p L SFA stents with now known bilateral SFA. b. right femoral to below the knee bypass by Dr. 11/01/2014 in 12/2019, c/b infection 02/2020.   Stroke [facility])    seen on CT Scan   Subdural hematoma (HCC)    Tobacco abuse    UTI (urinary tract infection)     Assessment: 8 yoM admitted with ischemic foot and occluded R fem-pop bypass. Pt to receive thrombolysis and heparin overnight with plans for relook lysis check tomorrow am in cath lab. Discussed with Dr 73 - no titration for now, pharmacy to follow peripherally and notify if heparin level is high.   Goal of Therapy:  Heparin level  0.2-0.5 units/ml Monitor platelets by anticoagulation protocol: Yes   Plan:  -Alteplase 1mg /h -Heparin 1000 units/h via sheath - no titrations for now -Check q6h heparin level, CBC, fibrinogen (notify MD if <150)   Darrick Penna, PharmD, BCPS, [facility] Clinical Pharmacist [phone] Please check AMION for all [facility] Pharmacy numbers 06/07/2021

## 2021-06-07 NOTE — Interval H&P Note (Signed)
History and Physical Interval Note:  06/07/2021 12:05 PM  Lonnie [name]  has presented today for surgery, with the diagnosis of OCCLUDED R FEM POP.  The various methods of treatment have been discussed with the patient and family. After consideration of risks, benefits and other options for treatment, the patient has consented to  Procedure(s): PERIPHERAL VASCULAR THROMBECTOMY (Right) as a surgical intervention.  The patient's history has been reviewed, patient examined, no change in status, stable for surgery.  I have reviewed the patient's chart and labs.  Questions were answered to the patient's satisfaction.     Fabienne Bruns

## 2021-06-07 NOTE — Op Note (Signed)
Procedure: Abdominal aortogram with right lower extremity runoff placement of thrombolytics catheter, ultrasound left groin  Preoperative diagnosis: Right foot ischemia with thrombosed femoropopliteal bypass  Postoperative diagnosis: Same  Anesthesia: Local with IV sedation  Operative findings: 1 patent aortoiliac system  2.  Occluded right femoral below-knee popliteal bypass   3.  Thrombolytic infusion wire placed with distal tip approximately 3 cm above the distal anastomosis  4.  Small blush of contrast extraluminal  Operative details: After obtaining form consent, the patient was taken to the PV lab.  The patient was placed in supine position angio table.  Both groins were prepped and draped in usual sterile fashion.  Local anesthesia was infiltrated over the left common femoral artery.  An introducer needle was used to cannulate left common femoral artery under ultrasound guidance.  An 035 versa core wire was advanced up the abdominal aorta under fluoroscopic guidance.  A 5 French sheath was then placed over the guidewire in the left common femoral artery.  This was thoroughly flushed with heparinized saline.  5 French pigtail catheter was then advanced over the guidewire and abdominal aortogram obtained in AP projection.  Left and right renal arteries are patent.  Infrarenal abdominal aorta is patent.  Left and right common internal and external iliac arteries are all patent.  There is mild tortuosity to the external iliac artery bilaterally.  At this point the 5 French pigtail catheter was removed over the guidewire and replaced with a 5 [place] crossover catheter.  This was used to selectively catheterize the right common iliac followed by the external iliac artery.  a 6 [place] destination sheath was then over a Rosen wire to the distal right common femoral artery.  Contrast angiogram was performed which showed a patent profunda and distal common femoral with occluded chronic SFA and there  was a stump of the bypass graft.  An 29 Berenstein catheter was used to direct an 035 angled Glidewire into the bypass graft and this advanced easily all the way down to the distal aspect of the bypass graft.  The catheter portion of the infusion wire was advanced over the wire.  Contrast angiogram was performed through the infusion catheter which confirmed we were within the bypass graft.  There was some small extravasation of contrast.  I was able to see the origins of the tibial runoff vessels.  These were very small and diseased.  Due to the blush of contrast I pulled the catheter back about 3 cm and confirmed at this point that we were well within the bypass graft which was completely thrombosed.  The infusion wire was then placed through the catheter.  The sheath was thoroughly flushed with heparinized saline.  The patient will be started on 1 mg/h of alteplase when it arrives from the pharmacy.  The patient was given a bolus of 5000 units of heparin so that he is anticoagulated while we wait for his heparin and alteplase to arrive.  We will do 1000 units an hour of heparin through the side-port of the sheath.  Patient tolerated procedure well and there were no complications.  Fabienne Bruns, MD [facility] Office: [phone]

## 2021-06-07 NOTE — Progress Notes (Addendum)
ANTICOAGULATION CONSULT NOTE   Pharmacy Consult for heparin Indication:  occluded fem-pop  Allergies  Allergen Reactions   Ace Inhibitors Swelling    Angioedema   Dairy Aid [Lactase]     gas   Eggs Or Egg-Derived Products     Cannot eat Prepared Eggs   Latex Itching    Patient Measurements: Height: 6\' 2"  (188 cm) Weight: 86.2 kg (190 lb) IBW/kg (Calculated) : 82.2 Heparin Dosing Weight: 86kg  Vital Signs: Temp: 98.7 F (37.1 C) (06/17 0909) Temp Source: Oral (06/17 1700) BP: 152/74 (06/17 1700) Pulse Rate: 81 (06/17 1700)  Labs: Recent Labs    06/07/21 1025 06/07/21 1619  HGB 8.8* 7.8*  HCT 26.0* 26.5*  PLT  --  420*  HEPARINUNFRC  --  0.11*  CREATININE 1.00  --      Estimated Creatinine Clearance: 82.2 mL/min (by C-G formula based on SCr of 1 mg/dL).   Medical History: Past Medical History:  Diagnosis Date   Allergy    Anxiety    Arthritis    left neck, shoulder, knee   CAD (coronary artery disease)    a. anterior STEMI 10/2013 s/p 4V CABG with LIMA to mid LAD, SVG to OM, SVG to PDA, SVG to Diagonal.   Chronic combined systolic and diastolic CHF (congestive heart failure) (HCC)    Dementia (HCC)    Depression    Falls    Hypertension    Ischemic cardiomyopathy    a. EF 40-45% at time of CABG and in 2018.   MI (myocardial infarction) (HCC)    Peripheral neuropathy    Prediabetes 09/01/2014   PVD (peripheral vascular disease) (HCC)    a. s/p L SFA stents with now known bilateral SFA. b. right femoral to below the knee bypass by Dr. 11/01/2014 in 12/2019, c/b infection 02/2020.   Stroke [facility])    seen on CT Scan   Subdural hematoma (HCC)    Tobacco abuse    UTI (urinary tract infection)     Assessment: 60 yoM admitted with ischemic foot and occluded R fem-pop bypass. Pt to receive thrombolysis and heparin overnight with plans for relook lysis check tomorrow am in cath lab. Discussed with Dr 73 - no titration for now, pharmacy to follow peripherally  and notify if heparin level is high.  Heparin drip 1000 uts/hr heparin level 0.12 < goal but no changes for now hgb stable 8 no bleeding fibrinogen 600 >126 Discussed with MD will drop alteplase drip 1mg >0.82mh /hr and recheck fibrinogen in 6hr Ok as long as fibrinogen >100  Goal of Therapy:  Heparin level 0.2-0.5 units/ml Monitor platelets by anticoagulation protocol: Yes   Plan:  -Alteplase 0.8mg /h -Heparin 1000 units/h via sheath - no titrations for now -Check q6h heparin level, CBC, fibrinogen (notify MD if <150)   Pharm.D. CPP, BCPS Clinical Pharmacist [phone] 06/07/2021 8:05 PM   Please check AMION for all [facility] Pharmacy numbers 06/07/2021

## 2021-06-08 ENCOUNTER — Inpatient Hospital Stay (HOSPITAL_COMMUNITY): Payer: Medicare Other | Admitting: Certified Registered Nurse Anesthetist

## 2021-06-08 ENCOUNTER — Encounter (HOSPITAL_COMMUNITY)
Admission: AD | Disposition: A | Discharge status: Home Health | Payer: Self-pay | Source: Ambulatory Visit | Attending: Surgery

## 2021-06-08 ENCOUNTER — Inpatient Hospital Stay (HOSPITAL_COMMUNITY): Payer: Medicare Other

## 2021-06-08 DIAGNOSIS — Y832 Surgical operation with anastomosis, bypass or graft as the cause of abnormal reaction of the patient, or of later complication, without mention of misadventure at the time of the procedure: Secondary | ICD-10-CM

## 2021-06-08 DIAGNOSIS — T827XXA Infection and inflammatory reaction due to other cardiac and vascular devices, implants and grafts, initial encounter: Secondary | ICD-10-CM

## 2021-06-08 HISTORY — PX: ANGIOPLASTY: SHX39

## 2021-06-08 HISTORY — PX: INSERTION OF ILIAC STENT: SHX6256

## 2021-06-08 LAB — BASIC METABOLIC PANEL
Anion gap: 10 (ref 5–15)
BUN: 18 mg/dL (ref 8–23)
CO2: 21 mmol/L — ABNORMAL LOW (ref 22–32)
Calcium: 8.7 mg/dL — ABNORMAL LOW (ref 8.9–10.3)
Chloride: 103 mmol/L (ref 98–111)
Creatinine, Ser: 0.98 mg/dL (ref 0.61–1.24)
GFR, Estimated: >60 mL/min (ref >60)
Glucose, Bld: 123 mg/dL — ABNORMAL HIGH (ref 70–99)
Potassium: 4.3 mmol/L (ref 3.5–5.1)
Sodium: 134 mmol/L — ABNORMAL LOW (ref 135–145)

## 2021-06-08 LAB — CBC
HCT: 25.4 % — ABNORMAL LOW (ref 39.0–52.0)
HCT: 25.7 % — ABNORMAL LOW (ref 39.0–52.0)
Hemoglobin: 7.6 g/dL — ABNORMAL LOW (ref 13.0–17.0)
Hemoglobin: 7.5 g/dL — ABNORMAL LOW (ref 13.0–17.0)
MCH: 21.5 pg — ABNORMAL LOW (ref 26.0–34.0)
MCH: 21.1 pg — ABNORMAL LOW (ref 26.0–34.0)
MCHC: 29.9 g/dL — ABNORMAL LOW (ref 30.0–36.0)
MCHC: 29.2 g/dL — ABNORMAL LOW (ref 30.0–36.0)
MCV: 72 fL — ABNORMAL LOW (ref 80.0–100.0)
MCV: 72.2 fL — ABNORMAL LOW (ref 80.0–100.0)
Platelets: 361 10*3/uL (ref 150–400)
Platelets: 318 10*3/uL (ref 150–400)
RBC: 3.53 MIL/uL — ABNORMAL LOW (ref 4.22–5.81)
RBC: 3.56 MIL/uL — ABNORMAL LOW (ref 4.22–5.81)
RDW: 18.2 % — ABNORMAL HIGH (ref 11.5–15.5)
RDW: 18.1 % — ABNORMAL HIGH (ref 11.5–15.5)
WBC: 20.1 10*3/uL — ABNORMAL HIGH (ref 4.0–10.5)
WBC: 20.1 10*3/uL — ABNORMAL HIGH (ref 4.0–10.5)
nRBC: 0 % (ref 0.0–0.2)
nRBC: 0 % (ref 0.0–0.2)

## 2021-06-08 LAB — HEPARIN LEVEL (UNFRACTIONATED): Heparin Unfractionated: 0.23 IU/mL — ABNORMAL LOW (ref 0.30–0.70)

## 2021-06-08 LAB — FIBRINOGEN
Fibrinogen: 275 mg/dL (ref 210–475)
Fibrinogen: 327 mg/dL (ref 210–475)

## 2021-06-08 SURGERY — LYSIS RE-CHECK
Anesthesia: Monitor Anesthesia Care | Site: Leg Upper | Laterality: Right

## 2021-06-08 MED ORDER — ONDANSETRON HCL 4 MG/2ML IJ SOLN: 4.0000 mg | Freq: Four times a day (QID) | INTRAMUSCULAR | Status: DC | PRN

## 2021-06-08 MED ORDER — HYDRALAZINE HCL 20 MG/ML IJ SOLN: 5.0000 mg | INTRAMUSCULAR | Status: DC | PRN

## 2021-06-08 MED ORDER — FENTANYL CITRATE (PF) 100 MCG/2ML IJ SOLN: 25.0000 ug | INTRAMUSCULAR | Status: DC | PRN

## 2021-06-08 MED ORDER — ASPIRIN EC 81 MG PO TBEC
81.0000 mg | DELAYED_RELEASE_TABLET | Freq: Every day | ORAL | Status: DC
  Administered 2021-06-08 – 2021-06-14 (×7): 81 mg via ORAL
  Filled 2021-06-08 (×7): qty 1

## 2021-06-08 MED ORDER — PROPOFOL 10 MG/ML IV BOLUS
INTRAVENOUS | Status: DC | PRN
  Administered 2021-06-08: 20 mg via INTRAVENOUS

## 2021-06-08 MED ORDER — CLOPIDOGREL BISULFATE 75 MG PO TABS
75.0000 mg | ORAL_TABLET | Freq: Every day | ORAL | Status: DC
  Administered 2021-06-09 – 2021-06-14 (×6): 75 mg via ORAL
  Filled 2021-06-08 (×6): qty 1

## 2021-06-08 MED ORDER — SODIUM CHLORIDE 0.9 % IV SOLN
INTRAVENOUS | Status: DC | PRN
  Administered 2021-06-08: 500 mL

## 2021-06-08 MED ORDER — FENTANYL CITRATE (PF) 250 MCG/5ML IJ SOLN
INTRAMUSCULAR | Status: DC | PRN
  Administered 2021-06-08: 25 ug via INTRAVENOUS

## 2021-06-08 MED ORDER — QUETIAPINE FUMARATE 100 MG PO TABS
200.0000 mg | ORAL_TABLET | Freq: Two times a day (BID) | ORAL | Status: DC
  Administered 2021-06-08 – 2021-06-14 (×12): 200 mg via ORAL
  Filled 2021-06-08 (×13): qty 2

## 2021-06-08 MED ORDER — SODIUM CHLORIDE 0.9 % IV SOLN: 250.0000 mL | INTRAVENOUS | Status: DC | PRN

## 2021-06-08 MED ORDER — CHLORHEXIDINE GLUCONATE 0.12 % MT SOLN
OROMUCOSAL | Status: AC
  Filled 2021-06-08: qty 15

## 2021-06-08 MED ORDER — LACTATED RINGERS IV SOLN: INTRAVENOUS | Status: DC

## 2021-06-08 MED ORDER — PROPOFOL 500 MG/50ML IV EMUL
INTRAVENOUS | Status: AC
  Filled 2021-06-08: qty 50

## 2021-06-08 MED ORDER — PHENYLEPHRINE 40 MCG/ML (10ML) SYRINGE FOR IV PUSH (FOR BLOOD PRESSURE SUPPORT)
PREFILLED_SYRINGE | INTRAVENOUS | Status: DC | PRN
  Administered 2021-06-08: 40 ug via INTRAVENOUS
  Administered 2021-06-08: 80 ug via INTRAVENOUS

## 2021-06-08 MED ORDER — ONDANSETRON HCL 4 MG/2ML IJ SOLN: 4.0000 mg | Freq: Once | INTRAMUSCULAR | Status: DC | PRN

## 2021-06-08 MED ORDER — PROPOFOL 500 MG/50ML IV EMUL
INTRAVENOUS | Status: DC | PRN
  Administered 2021-06-08: 50 ug/kg/min via INTRAVENOUS

## 2021-06-08 MED ORDER — SODIUM CHLORIDE 0.9 % IV SOLN: 250.0000 mL | INTRAVENOUS | Status: DC

## 2021-06-08 MED ORDER — LACTATED RINGERS IV SOLN: INTRAVENOUS | Status: DC | PRN

## 2021-06-08 MED ORDER — SODIUM CHLORIDE 0.9 % WEIGHT BASED INFUSION
1.0000 mL/kg/h | INTRAVENOUS | Status: AC
  Administered 2021-06-08: 1 mL/kg/h via INTRAVENOUS

## 2021-06-08 MED ORDER — EPHEDRINE SULFATE-NACL 50-0.9 MG/10ML-% IV SOSY
PREFILLED_SYRINGE | INTRAVENOUS | Status: DC | PRN
  Administered 2021-06-08 (×3): 10 mg via INTRAVENOUS
  Administered 2021-06-08: 15 mg via INTRAVENOUS
  Administered 2021-06-08: 10 mg via INTRAVENOUS
  Administered 2021-06-08: 5 mg via INTRAVENOUS
  Administered 2021-06-08: 10 mg via INTRAVENOUS

## 2021-06-08 MED ORDER — 0.9 % SODIUM CHLORIDE (POUR BTL) OPTIME
TOPICAL | Status: DC | PRN
  Administered 2021-06-08: 1000 mL

## 2021-06-08 MED ORDER — OXYCODONE HCL 5 MG PO TABS
5.0000 mg | ORAL_TABLET | ORAL | Status: DC | PRN
Start: 2021-06-08 — End: 2021-06-14
  Administered 2021-06-08: 10 mg via ORAL
  Administered 2021-06-09 (×2): 5 mg via ORAL
  Administered 2021-06-10 – 2021-06-13 (×7): 10 mg via ORAL
  Filled 2021-06-08 (×3): qty 2
  Filled 2021-06-08: qty 1
  Filled 2021-06-08 (×2): qty 2
  Filled 2021-06-08: qty 1
  Filled 2021-06-08 (×2): qty 2
  Filled 2021-06-08: qty 1
  Filled 2021-06-08: qty 2

## 2021-06-08 MED ORDER — IOHEXOL 300 MG/ML  SOLN
INTRAMUSCULAR | Status: DC | PRN
  Administered 2021-06-08: 5 mL

## 2021-06-08 MED ORDER — PHENYLEPHRINE HCL-NACL 10-0.9 MG/250ML-% IV SOLN
INTRAVENOUS | Status: DC | PRN
  Administered 2021-06-08: 70 ug/min via INTRAVENOUS
  Administered 2021-06-08: 20 ug/min via INTRAVENOUS

## 2021-06-08 MED ORDER — ACETAMINOPHEN 325 MG PO TABS
650.0000 mg | ORAL_TABLET | ORAL | Status: DC | PRN
  Administered 2021-06-09: 650 mg via ORAL
  Filled 2021-06-08: qty 2

## 2021-06-08 MED ORDER — CLONAZEPAM 0.25 MG PO TBDP
0.2500 mg | ORAL_TABLET | Freq: Once | ORAL | Status: AC
  Administered 2021-06-08: 0.25 mg via ORAL
  Filled 2021-06-08: qty 1

## 2021-06-08 MED ORDER — OXYCODONE HCL 5 MG/5ML PO SOLN: 5.0000 mg | Freq: Once | ORAL | Status: DC | PRN

## 2021-06-08 MED ORDER — SODIUM CHLORIDE 0.9% FLUSH
3.0000 mL | Freq: Two times a day (BID) | INTRAVENOUS | Status: DC
  Administered 2021-06-09 – 2021-06-11 (×5): 3 mL via INTRAVENOUS

## 2021-06-08 MED ORDER — FENTANYL CITRATE (PF) 250 MCG/5ML IJ SOLN
INTRAMUSCULAR | Status: AC
  Filled 2021-06-08: qty 5

## 2021-06-08 MED ORDER — CHLORHEXIDINE GLUCONATE 0.12 % MT SOLN
15.0000 mL | Freq: Once | OROMUCOSAL | Status: AC
  Administered 2021-06-08: 15 mL via OROMUCOSAL

## 2021-06-08 MED ORDER — LIDOCAINE HCL 1 % IJ SOLN
INTRAMUSCULAR | Status: DC | PRN
  Administered 2021-06-08: 8 mL

## 2021-06-08 MED ORDER — ALBUMIN HUMAN 5 % IV SOLN: INTRAVENOUS | Status: DC | PRN

## 2021-06-08 MED ORDER — OXYCODONE HCL 5 MG PO TABS: 5.0000 mg | ORAL_TABLET | Freq: Once | ORAL | Status: DC | PRN

## 2021-06-08 MED ORDER — MORPHINE SULFATE (PF) 2 MG/ML IV SOLN: 2.0000 mg | INTRAVENOUS | Status: DC | PRN

## 2021-06-08 MED ORDER — LABETALOL HCL 5 MG/ML IV SOLN
10.0000 mg | INTRAVENOUS | Status: DC | PRN
  Administered 2021-06-09: 10 mg via INTRAVENOUS

## 2021-06-08 MED ORDER — IODIXANOL 320 MG/ML IV SOLN
INTRAVENOUS | Status: DC | PRN
  Administered 2021-06-08: 100 mL

## 2021-06-08 MED ORDER — HEPARIN SODIUM (PORCINE) 1000 UNIT/ML IJ SOLN
INTRAMUSCULAR | Status: DC | PRN
  Administered 2021-06-08: 1000 [IU] via INTRAVENOUS
  Administered 2021-06-08: 8000 [IU] via INTRAVENOUS

## 2021-06-08 MED ORDER — PHENYLEPHRINE HCL-NACL 10-0.9 MG/250ML-% IV SOLN: 0.0000 ug/min | INTRAVENOUS | Status: DC

## 2021-06-08 MED ORDER — SODIUM CHLORIDE 0.9% FLUSH: 3.0000 mL | INTRAVENOUS | Status: DC | PRN

## 2021-06-08 MED ORDER — PHENYLEPHRINE HCL-NACL 10-0.9 MG/250ML-% IV SOLN
25.0000 ug/min | INTRAVENOUS | Status: DC
Start: 2021-06-08 — End: 2021-06-09
  Administered 2021-06-08: 30 ug/min via INTRAVENOUS
  Filled 2021-06-08: qty 250

## 2021-06-08 SURGICAL SUPPLY — 41 items
BALLN MUSTANG 6X80X135 (BALLOONS) ×4
BALLN STERLING OTW 3X60X150 (BALLOONS) ×4
BALLOON MUSTANG 6X80X135 (BALLOONS) IMPLANT
BALLOON STERLING OTW 3X60X150 (BALLOONS) IMPLANT
CANISTER PENUMBRA ENGINE (MISCELLANEOUS) ×1 IMPLANT
CANISTER SUCT 3000ML PPV (MISCELLANEOUS) ×4 IMPLANT
CATH INDIGO CAT6 KIT (CATHETERS) ×1 IMPLANT
CATH QUICKCROSS .035X135CM (MICROCATHETER) ×1 IMPLANT
CATH VISIONS PV .035 IVUS (CATHETERS) IMPLANT
CLOSURE MYNX CONTROL 6F/7F (Vascular Products) ×1 IMPLANT
COVER MAYO STAND STRL (DRAPES) IMPLANT
COVER WAND RF STERILE (DRAPES) ×4 IMPLANT
GLOVE BIOGEL PI IND STRL 7.5 (GLOVE) ×3 IMPLANT
GLOVE BIOGEL PI INDICATOR 7.5 (GLOVE) ×1
GLOVE INDICATOR 7.5 STRL GRN (GLOVE) ×4 IMPLANT
GOWN STRL REUS W/ TWL LRG LVL3 (GOWN DISPOSABLE) ×6 IMPLANT
GOWN STRL REUS W/ TWL XL LVL3 (GOWN DISPOSABLE) ×3 IMPLANT
GOWN STRL REUS W/TWL LRG LVL3 (GOWN DISPOSABLE) ×8
GOWN STRL REUS W/TWL XL LVL3 (GOWN DISPOSABLE) ×4
GUIDEWIRE BENTSON (WIRE) IMPLANT
KIT ENCORE 26 ADVANTAGE (KITS) ×1 IMPLANT
NDL HYPO 25GX1X1/2 BEV (NEEDLE) ×3 IMPLANT
NEEDLE HYPO 25GX1X1/2 BEV (NEEDLE) ×4 IMPLANT
NS IRRIG 1000ML POUR BTL (IV SOLUTION) IMPLANT
PACK ENDO MINOR (CUSTOM PROCEDURE TRAY) ×4 IMPLANT
PAD ARMBOARD 7.5X6 YLW CONV (MISCELLANEOUS) ×8 IMPLANT
SHEATH BRITE TIP 7FRX11 (SHEATH) ×1 IMPLANT
SHEATH PINNACLE ST 7F 45CM (SHEATH) ×1 IMPLANT
SHIELD RADPAD SCOOP 12X17 (MISCELLANEOUS) ×2 IMPLANT
STENT VIABAHN 6X100X120 (Permanent Stent) ×1 IMPLANT
STENT VIABAHN 6X50X120 (Permanent Stent) ×1 IMPLANT
STOPCOCK 3 WAY HIGH PRESSURE (MISCELLANEOUS) ×4
STOPCOCK 3WAY HIGH PRESSURE (MISCELLANEOUS) IMPLANT
SYR 5ML LL (SYRINGE) ×1 IMPLANT
SYR CONTROL 10ML LL (SYRINGE) ×6 IMPLANT
TOWEL GREEN STERILE (TOWEL DISPOSABLE) ×8 IMPLANT
TOWEL GREEN STERILE FF (TOWEL DISPOSABLE) IMPLANT
WATER STERILE IRR 1000ML POUR (IV SOLUTION) IMPLANT
WIRE AMPLATZ SS-J .035X260CM (WIRE) IMPLANT
WIRE G V18X300CM (WIRE) ×1 IMPLANT
WIRE HI TORQ VERSACORE J 260CM (WIRE) ×1 IMPLANT

## 2021-06-08 NOTE — Op Note (Signed)
    Patient name: Lonnie [name] MRN: [id number] DOB: 06/11/1951 Sex: male  06/08/2021 Pre-operative Diagnosis: Occluded right femoral-popliteal bypass graft Post-operative diagnosis:  Same Surgeon:  Annamarie Major Procedure Performed:  1.  Follow-up thrombolytic angiogram  2.  Mechanical thrombectomy of the right femoral-popliteal bypass graft  3.  Stent x2 of the right femoral-popliteal bypass graft  4.  Closure device, Mynx   Indications: This is a 70 year old gentleman who underwent right femoral below-knee popliteal bypass graft with Gore-Tex over a year ago.  He came in with a 1 week history of right leg pain.  His graft was found to be occluded.  He underwent placement of a lytics catheter yesterday and is back for follow-up.  Procedure:  The patient was identified in the holding area and taken to room 8.  The patient was then placed supine on the table and prepped and draped in the usual sterile fashion.  A time out was called.  MAC anesthesia was administered.   Findings:    Right Lower Extremity: The right common femoral and profundofemoral artery were widely patent.  The femoral popliteal bypass graft is now patent.  There is a retained area of thrombus just beyond the origin of the graft.  There is luminal irregularity at the distal anastomosis.  The below-knee popliteal artery is patent but small in caliber.  The posterior tibial artery is the dominant runoff however just above the ankle this collateralizes to get blood flow to the foot    Intervention: I exchanged the 6 [place] sheath for a 7 [place] sheath.  I then set up for stenting to cover the thrombus however on follow-up imaging the bypass had reoccluded.  The patient was bolused with heparin.  A CAT 6 device was then inserted into the bypass graft and mechanical thrombectomy was then performed.  On subsequent imaging, there appeared to be residual thrombus near the distal anastomosis.  I elected to cover this area with a 6 x  10 Viabahn.  This was dilated with a 6 mm balloon.  I then placed a second 6 x 5 Viabahn stent to cover the thrombus near the proximal graft.  This was also dilated with a 6 mm balloon.  Completion imaging showed a widely patent bypass graft with unchanged runoff.  The groin was then closed with a minx.  Impression:  #1 mechanical thrombectomy of an occluded femoral-popliteal bypass graft following overnight thrombolytic therapy.  There was luminal irregularity at the distal anastomosis along with residual thrombus that was covered using a 6 x 10 Viabahn.  Additionally, the proximal bypass had an area of some thrombus which was covered with a 6 x 5 Viabahn  #2 the posterior tibial is the dominant runoff however it collateralizes near the ankle.  #3 the patient suffers from dementia as well as multiple falls, therefore anticoagulation is contraindicated and so he will be continued with aspirin and Plavix     V. Annamarie Major, M.D., [facility] [place] Office: [phone] Pager:  [phone]

## 2021-06-08 NOTE — Progress Notes (Signed)
    Subjective  - POD #1  No overnight issues   Physical Exam:  PT/DP signal       Assessment/Plan:  POD #1  Plan for follow up lysis this am  Wells Berl Bonfanti 06/08/2021 8:37 AM --  Vitals:   06/08/21 0815 06/08/21 0830  BP:  (!) 144/65  Pulse:  84  Resp:  17  Temp: 99.4 F (37.4 C)   SpO2:  98%    Intake/Output Summary (Last 24 hours) at 06/08/2021 0837 Last data filed at 06/08/2021 0400 Gross per 24 hour  Intake 1733.02 ml  Output 450 ml  Net 1283.02 ml     Laboratory CBC    Component Value Date/Time   WBC 20.1 (H) 06/08/2021 0317   HGB 7.6 (L) 06/08/2021 0317   HGB 15.7 01/10/2020 1146   HCT 25.4 (L) 06/08/2021 0317   HCT 46.1 01/10/2020 1146   PLT 361 06/08/2021 0317   PLT 232 01/10/2020 1146    BMET    Component Value Date/Time   NA 134 (L) 06/08/2021 0317   NA 142 01/10/2020 1146   K 4.3 06/08/2021 0317   CL 103 06/08/2021 0317   CO2 21 (L) 06/08/2021 0317   GLUCOSE 123 (H) 06/08/2021 0317   BUN 18 06/08/2021 0317   BUN 19 01/10/2020 1146   CREATININE 0.98 06/08/2021 0317   CREATININE 0.94 05/24/2021 1549   CALCIUM 8.7 (L) 06/08/2021 0317   GFRNONAA >60 06/08/2021 0317   GFRAA >60 09/12/2020 0357    COAG Lab Results  Component Value Date   INR 1.1 09/10/2020   INR 1.0 03/01/2020   INR 0.9 01/20/2020   No results found for: PTT  Antibiotics Anti-infectives (From admission, onward)    None        V. Charlena Cross, M.D., [facility] [place] Office: [phone] Pager:  [phone]

## 2021-06-08 NOTE — Transfer of Care (Signed)
Immediate Anesthesia Transfer of Care Note  Patient: Lonnie [name]  Procedure(s) Performed: LYSIS RE-CHECK (Left: Groin) BALLOON ANGIOPLASTY (Right: Leg Lower) INSERTION OF RIGHT UPPER LEG STENT (Right: Leg Upper) PERCUTANEOUS THROMBECTOMY USING PENUMBRA (Right: Leg Lower)  Patient Location: PACU  Anesthesia Type:MAC  Level of Consciousness: drowsy and patient cooperative  Airway & Oxygen Therapy: Patient Spontanous Breathing and Patient connected to face mask oxygen  Post-op Assessment: Report given to RN and Post -op Vital signs reviewed and stable  Post vital signs: Reviewed and stable  Last Vitals:  Vitals Value Taken Time  BP 119/68 06/08/21 1238  Temp    Pulse 92 06/08/21 1241  Resp 14 06/08/21 1241  SpO2 100 % 06/08/21 1241  Vitals shown include unvalidated device data.  Last Pain:  Vitals:   06/08/21 0930  TempSrc: Oral  PainSc:     MDA Joslin aware of pt remaining on Neo gtt for transfer to PACU  Patients Stated Pain Goal: 0 (20/94/70 9628)  Complications: No notable events documented.

## 2021-06-08 NOTE — Anesthesia Preprocedure Evaluation (Addendum)
Anesthesia Evaluation  Patient identified by MRN, date of birth, ID band Patient awake    Reviewed: Allergy & Precautions, NPO status , Patient's Chart, lab work & pertinent test results  Airway Mallampati: II  TM Distance: >3 FB Neck ROM: Full    Dental  (+) Teeth Intact   Pulmonary Current Smoker and Patient abstained from smoking.,    breath sounds clear to auscultation       Cardiovascular hypertension,  Rhythm:Regular Rate:Normal     Neuro/Psych    GI/Hepatic   Endo/Other    Renal/GU      Musculoskeletal   Abdominal   Peds  Hematology   Anesthesia Other Findings   Reproductive/Obstetrics                             Anesthesia Physical Anesthesia Plan  ASA: 3  Anesthesia Plan: MAC   Post-op Pain Management:    Induction: Intravenous  PONV Risk Score and Plan: Ondansetron and Propofol infusion  Airway Management Planned: Natural Airway and Simple Face Mask  Additional Equipment:   Intra-op Plan:   Post-operative Plan:   Informed Consent: I have reviewed the patients History and Physical, chart, labs and discussed the procedure including the risks, benefits and alternatives for the proposed anesthesia with the patient or authorized representative who has indicated his/her understanding and acceptance.       Plan Discussed with: CRNA and Anesthesiologist  Anesthesia Plan Comments:         Anesthesia Quick Evaluation

## 2021-06-08 NOTE — Anesthesia Postprocedure Evaluation (Signed)
Anesthesia Post Note  Patient: Lonnie [name]  Procedure(s) Performed: LYSIS RE-CHECK (Left: Groin) BALLOON ANGIOPLASTY (Right: Leg Lower) INSERTION OF RIGHT UPPER LEG STENT (Right: Leg Upper) PERCUTANEOUS THROMBECTOMY USING PENUMBRA (Right: Leg Lower)     Patient location during evaluation: PACU Anesthesia Type: MAC Level of consciousness: awake and alert Pain management: pain level controlled Vital Signs Assessment: post-procedure vital signs reviewed and stable Respiratory status: spontaneous breathing, nonlabored ventilation, respiratory function stable and patient connected to nasal cannula oxygen Cardiovascular status: stable and blood pressure returned to baseline Postop Assessment: no apparent nausea or vomiting Anesthetic complications: no   No notable events documented.  Last Vitals:  Vitals:   06/08/21 1900 06/08/21 1937  BP: (!) 96/57   Pulse: (!) 102   Resp: 14   Temp:  36.8 C  SpO2: 98%     Last Pain:  Vitals:   06/08/21 1937  TempSrc: Oral  PainSc:                  Margurete Guaman COKER

## 2021-06-09 DIAGNOSIS — I70401 Unspecified atherosclerosis of autologous vein bypass graft(s) of the extremities, right leg: Secondary | ICD-10-CM

## 2021-06-09 DIAGNOSIS — F0390 Unspecified dementia without behavioral disturbance: Secondary | ICD-10-CM

## 2021-06-09 DIAGNOSIS — Z4889 Encounter for other specified surgical aftercare: Secondary | ICD-10-CM

## 2021-06-09 LAB — POCT ACTIVATED CLOTTING TIME
Activated Clotting Time: 150 seconds
Activated Clotting Time: 242 seconds
Activated Clotting Time: 248 seconds

## 2021-06-09 LAB — CBC
HCT: 19.7 % — ABNORMAL LOW (ref 39.0–52.0)
Hemoglobin: 5.8 g/dL — CL (ref 13.0–17.0)
MCH: 21 pg — ABNORMAL LOW (ref 26.0–34.0)
MCHC: 29.4 g/dL — ABNORMAL LOW (ref 30.0–36.0)
MCV: 71.4 fL — ABNORMAL LOW (ref 80.0–100.0)
Platelets: 253 10*3/uL (ref 150–400)
RBC: 2.76 MIL/uL — ABNORMAL LOW (ref 4.22–5.81)
RDW: 17.8 % — ABNORMAL HIGH (ref 11.5–15.5)
WBC: 12.1 10*3/uL — ABNORMAL HIGH (ref 4.0–10.5)
nRBC: 0 % (ref 0.0–0.2)

## 2021-06-09 LAB — HEMOGLOBIN AND HEMATOCRIT, BLOOD
HCT: 28.1 % — ABNORMAL LOW (ref 39.0–52.0)
Hemoglobin: 8.7 g/dL — ABNORMAL LOW (ref 13.0–17.0)

## 2021-06-09 LAB — PREPARE RBC (CROSSMATCH)

## 2021-06-09 MED ORDER — ALBUMIN HUMAN 25 % IV SOLN
12.5000 g | Freq: Once | INTRAVENOUS | Status: AC
  Administered 2021-06-09: 12.5 g via INTRAVENOUS
  Filled 2021-06-09: qty 50

## 2021-06-09 NOTE — Progress Notes (Signed)
    Subjective  -  Foot feels better   Physical Exam:  Brisk doppler signal to right PT Left groin without hematoma Wound to dorsum of foot unchanged       Assessment/Plan:    Successful lysis of occluded bypass graft.  Patinet is a high fall risk given his dementia, so I will  not add anticoagulation, but continue DAPT  Continue statin  Wound care consult for right leg wound.  Xeroform for now  Dispo complicated by care givers having covid now.  Wells Sherline Eberwein 06/09/2021 8:15 AM --  Vitals:   06/09/21 0600 06/09/21 0805  BP: (!) 116/50   Pulse: 95   Resp: (!) 21   Temp:  100.3 F (37.9 C)  SpO2: 96%     Intake/Output Summary (Last 24 hours) at 06/09/2021 0815 Last data filed at 06/08/2021 1900 Gross per 24 hour  Intake 1513.8 ml  Output 600 ml  Net 913.8 ml     Laboratory CBC    Component Value Date/Time   WBC 20.1 (H) 06/08/2021 1435   HGB 7.5 (L) 06/08/2021 1435   HGB 15.7 01/10/2020 1146   HCT 25.7 (L) 06/08/2021 1435   HCT 46.1 01/10/2020 1146   PLT 318 06/08/2021 1435   PLT 232 01/10/2020 1146    BMET    Component Value Date/Time   NA 134 (L) 06/08/2021 0317   NA 142 01/10/2020 1146   K 4.3 06/08/2021 0317   CL 103 06/08/2021 0317   CO2 21 (L) 06/08/2021 0317   GLUCOSE 123 (H) 06/08/2021 0317   BUN 18 06/08/2021 0317   BUN 19 01/10/2020 1146   CREATININE 0.98 06/08/2021 0317   CREATININE 0.94 05/24/2021 1549   CALCIUM 8.7 (L) 06/08/2021 0317   GFRNONAA >60 06/08/2021 0317   GFRAA >60 09/12/2020 0357    COAG Lab Results  Component Value Date   INR 1.1 09/10/2020   INR 1.0 03/01/2020   INR 0.9 01/20/2020   No results found for: PTT  Antibiotics Anti-infectives (From admission, onward)    None        V. Charlena Cross, M.D., [facility] [place] Office: [phone] Pager:  [phone]

## 2021-06-09 NOTE — Progress Notes (Signed)
Date and time results received: 06/09/21 1147  Critical Value: Hgb 5.8  Name of Provider Notified: Coral Else, MD  Orders Received? 2 units PRBC

## 2021-06-10 ENCOUNTER — Telehealth: Payer: Self-pay | Admitting: Internal Medicine

## 2021-06-10 ENCOUNTER — Encounter (HOSPITAL_COMMUNITY): Payer: Self-pay | Admitting: Surgery

## 2021-06-10 LAB — TYPE AND SCREEN
ABO/RH(D): O POS
Antibody Screen: NEGATIVE
Unit division: 0
Unit division: 0

## 2021-06-10 LAB — BPAM RBC
Blood Product Expiration Date: 202207162359
Blood Product Expiration Date: 202207162359
ISSUE DATE / TIME: 202206191210
ISSUE DATE / TIME: 202206191416
Unit Type and Rh: 5100
Unit Type and Rh: 5100

## 2021-06-10 LAB — BASIC METABOLIC PANEL
Anion gap: 11 (ref 5–15)
BUN: 18 mg/dL (ref 8–23)
CO2: 21 mmol/L — ABNORMAL LOW (ref 22–32)
Calcium: 8.5 mg/dL — ABNORMAL LOW (ref 8.9–10.3)
Chloride: 103 mmol/L (ref 98–111)
Creatinine, Ser: 0.91 mg/dL (ref 0.61–1.24)
GFR, Estimated: >60 mL/min (ref >60)
Glucose, Bld: 98 mg/dL (ref 70–99)
Potassium: 3.6 mmol/L (ref 3.5–5.1)
Sodium: 135 mmol/L (ref 135–145)

## 2021-06-10 LAB — CBC
HCT: 27.6 % — ABNORMAL LOW (ref 39.0–52.0)
Hemoglobin: 8.6 g/dL — ABNORMAL LOW (ref 13.0–17.0)
MCH: 22.8 pg — ABNORMAL LOW (ref 26.0–34.0)
MCHC: 31.2 g/dL (ref 30.0–36.0)
MCV: 73 fL — ABNORMAL LOW (ref 80.0–100.0)
Platelets: 269 10*3/uL (ref 150–400)
RBC: 3.78 MIL/uL — ABNORMAL LOW (ref 4.22–5.81)
RDW: 19.3 % — ABNORMAL HIGH (ref 11.5–15.5)
WBC: 18.7 10*3/uL — ABNORMAL HIGH (ref 4.0–10.5)
nRBC: 0 % (ref 0.0–0.2)

## 2021-06-10 MED FILL — Lidocaine HCl Local Preservative Free (PF) Inj 1%: INTRAMUSCULAR | Qty: 30 | Status: AC

## 2021-06-10 MED FILL — Heparin Sodium (Porcine) Inj 1000 Unit/ML: INTRAMUSCULAR | Qty: 10 | Status: AC

## 2021-06-10 NOTE — Telephone Encounter (Signed)
Labs collected on 07/06/2021 and [facility] sent to me. Iron: 12 (low) Ferritin 41 (normal) LFTs: Alkaline phosphatase 147 (high). Patient is now dealing with a ischemic limb, will discuss above on RTC

## 2021-06-10 NOTE — Progress Notes (Signed)
  Progress Note    06/10/2021 8:00 AM 2 Days Post-Op  Subjective:  no complaint. Says foot is better   Vitals:   06/10/21 0543 06/10/21 0615  BP: (!) 114/58 (!) 104/56  Pulse:    Resp:    Temp:    SpO2:     Physical Exam: Cardiac: regular Lungs:  non labored Extremities:  left femoral access site soft without hematoma. R foot wounds dressed. Doppler PT signal in right foot Neurologic: alert and oriented  CBC    Component Value Date/Time   WBC 18.7 (H) 06/10/2021 0249   RBC 3.78 (L) 06/10/2021 0249   HGB 8.6 (L) 06/10/2021 0249   HGB 15.7 01/10/2020 1146   HCT 27.6 (L) 06/10/2021 0249   HCT 46.1 01/10/2020 1146   PLT 269 06/10/2021 0249   PLT 232 01/10/2020 1146   MCV 73.0 (L) 06/10/2021 0249   MCV 83 01/10/2020 1146   MCH 22.8 (L) 06/10/2021 0249   MCHC 31.2 06/10/2021 0249   RDW 19.3 (H) 06/10/2021 0249   RDW 12.8 01/10/2020 1146   LYMPHSABS 2,420 05/24/2021 1549   LYMPHSABS 2.5 03/01/2015 1414   MONOABS 1.1 (H) 09/12/2020 0357   EOSABS 1,021 (H) 05/24/2021 1549   EOSABS 0.4 03/01/2015 1414   BASOSABS 155 05/24/2021 1549   BASOSABS 0.1 03/01/2015 1414    BMET    Component Value Date/Time   NA 135 06/10/2021 0249   NA 142 01/10/2020 1146   K 3.6 06/10/2021 0249   CL 103 06/10/2021 0249   CO2 21 (L) 06/10/2021 0249   GLUCOSE 98 06/10/2021 0249   BUN 18 06/10/2021 0249   BUN 19 01/10/2020 1146   CREATININE 0.91 06/10/2021 0249   CREATININE 0.94 05/24/2021 1549   CALCIUM 8.5 (L) 06/10/2021 0249   GFRNONAA >60 06/10/2021 0249   GFRAA >60 09/12/2020 0357    INR    Component Value Date/Time   INR 1.1 09/10/2020 0700     Intake/Output Summary (Last 24 hours) at 06/10/2021 0800 Last data filed at 06/09/2021 1700 Gross per 24 hour  Intake 630 ml  Output 1000 ml  Net -370 ml     Assessment/Plan:  70 y.o. male is s/p lysis of RLE bypass graft 2 Days Post-Op   RLE well perfused with brisk PT signal in right foot Pain remains improved right  lower extremity Hgb responded appropriately to transfusion yesterday of 2 units PRBC now 8.6  Remains on DAPT Continue Statin Continue local wound care to right foot   Graceann Congress, PA-C [facility] [phone] 06/10/2021 8:00 AM

## 2021-06-10 NOTE — Evaluation (Signed)
Occupational Therapy Evaluation Patient Details Name: Lonnie [name] MRN: [id number] DOB: Jul 03, 1951 Today's Date: 06/10/2021    History of Present Illness Lonnie [name] is a 70 y.o. male with progressive R foot/calf pain. Duplex revealed occlusion of right femoral to BK popliteal bypass. Acute ischemic skin changes of dorsum of right foot. Pt underwent peripheral vascular thrombectomy of R LE.   Clinical Impression   PTA, pt lives with spouse and reports caregiver assist 3 days/wk for 2.5 hours. Pt reports ambulatory with RW, Modified Independent with toileting task and has caregiver assist for showering tasks, as well as LB dressing. Pt presents now with deficits in R LE pain, standing balance, and endurance. Pt able to demo sit to stand transfers at Min A using RW and able to take steps at bedside to chair. Pt requires Supervision for UB ADLs and Mod A for LB ADLs due to deficits. Further mobility limited by R LE pain.   Noted in chart that pt's caregiver may have COVID and pt reports his wife likely not able to physically assist pt. If decreased caregiver support noted at home - recommend SNF for short term rehab. However, anticipate pt to progress well with improved pain control and limited assist at home, as well as HH therapy follow-up. Will continue to follow acutely to finalize DC recs.   Follow Up Recommendations  Home health OT;Supervision - Intermittent (SNF rehab if adequate caregiver support not available at DC)    Equipment Recommendations  3 in 1 bedside commode    Recommendations for Other Services       Precautions / Restrictions Precautions Precautions: Fall Restrictions Weight Bearing Restrictions: No      Mobility Bed Mobility Overal bed mobility: Needs Assistance Bed Mobility: Supine to Sit     Supine to sit: Supervision;HOB elevated     General bed mobility comments: no physical assist needed    Transfers Overall transfer level: Needs  assistance Equipment used: Rolling walker (2 wheeled) Transfers: Sit to/from Stand Sit to Stand: Min assist;From elevated surface         General transfer comment: Min A for various sit to stands with RW during ADLs, cues for hand placement and able to take steps along bedside to chair using RW    Balance Overall balance assessment: Needs assistance Sitting-balance support: No upper extremity supported;Feet supported Sitting balance-Leahy Scale: Good     Standing balance support: Bilateral upper extremity supported;Single extremity supported;During functional activity Standing balance-Leahy Scale: Poor Standing balance comment: reliant on at least one UE support in standing                           ADL either performed or assessed with clinical judgement   ADL Overall ADL's : Needs assistance/impaired Eating/Feeding: Independent;Sitting   Grooming: Minimal assistance;Standing   Upper Body Bathing: Supervision/ safety;Sitting   Lower Body Bathing: Moderate assistance;Sit to/from stand Lower Body Bathing Details (indicate cue type and reason): Able to bathe peri region with one UE support. Likely will need assist to bathe B feet due to difficulty reaching Upper Body Dressing : Supervision/safety;Sitting   Lower Body Dressing: Moderate assistance;Sit to/from stand   Toilet Transfer: Minimal assistance;Stand-pivot;BSC;RW   Toileting- Clothing Manipulation and Hygiene: Minimal assistance;Sitting/lateral lean;Sit to/from stand         General ADL Comments: Pt with increased fall risk and limitations in standing balance/tolerance due to R LE pain     Vision Patient Visual Report:  No change from baseline Vision Assessment?: No apparent visual deficits     Perception     Praxis      Pertinent Vitals/Pain Pain Assessment: Faces Faces Pain Scale: Hurts even more Pain Location: R LE with WB and in dependent position Pain Descriptors / Indicators:  Guarding;Grimacing;Sore Pain Intervention(s): Monitored during session;Repositioned     Hand Dominance Right   Extremity/Trunk Assessment Upper Extremity Assessment Upper Extremity Assessment: Overall WFL for tasks assessed   Lower Extremity Assessment Lower Extremity Assessment: Defer to PT evaluation   Cervical / Trunk Assessment Cervical / Trunk Assessment: Kyphotic   Communication Communication Communication: No difficulties   Cognition Arousal/Alertness: Awake/alert Behavior During Therapy: WFL for tasks assessed/performed Overall Cognitive Status: History of cognitive impairments - at baseline Area of Impairment: Orientation;Attention;Memory;Awareness                 Orientation Level: Disoriented to;Time Current Attention Level: Selective Memory: Decreased short-term memory     Awareness: Emergent   General Comments: Hx of dementia at baseline, reports he is at [facility] though believed it was [place] location, reports June 2020 but with prompts, corrects self to 2022. Very pleasant, follows all directions with minor cues for safety/pacing   General Comments  Discussed with vascular PA and RN for removal of bedrest orders, clearance for mobility prior to eval, as well as clarification on order set with B knee immobilizers. PA discontinued KI order    Exercises     Shoulder Instructions      Home Living Family/patient expects to be discharged to:: Private residence Living Arrangements: Spouse/significant other Available Help at Discharge: Family;Personal care attendant;Available PRN/intermittently Type of Home: House Home Access: Ramped entrance     Home Layout: Two level;Able to live on main level with bedroom/bathroom     Bathroom Shower/Tub: Chief Strategy Officer: Standard     Home Equipment: Environmental consultant - 2 wheels;Tub bench;Hand held shower head;Wheelchair - manual   Additional Comments: PCA 3x/wk 1-3:30PM per pt      Prior  Functioning/Environment Level of Independence: Needs assistance  Gait / Transfers Assistance Needed: able to mobilize with RW in the home ADL's / Homemaking Assistance Needed: PCA assists with shower transfers, bathing and LB dressing tasks. pt reports difficulty reaching B feet            OT Problem List: Decreased strength;Decreased activity tolerance;Impaired balance (sitting and/or standing);Decreased knowledge of use of DME or AE;Decreased cognition;Decreased safety awareness;Pain      OT Treatment/Interventions: Self-care/ADL training;Therapeutic exercise;Energy conservation;DME and/or AE instruction;Therapeutic activities;Patient/family education;Balance training    OT Goals(Current goals can be found in the care plan section) Acute Rehab OT Goals Patient Stated Goal: go pain, pain control OT Goal Formulation: With patient Time For Goal Achievement: 06/24/21 Potential to Achieve Goals: Good  OT Frequency: Min 2X/week   Barriers to D/C:            Co-evaluation              AM-PAC OT "6 Clicks" Daily Activity     Outcome Measure Help from another person eating meals?: A Little Help from another person taking care of personal grooming?: A Little Help from another person toileting, which includes using toliet, bedpan, or urinal?: A Lot Help from another person bathing (including washing, rinsing, drying)?: A Lot Help from another person to put on and taking off regular upper body clothing?: A Little Help from another person to put on and taking off regular  lower body clothing?: A Lot 6 Click Score: 15   End of Session Equipment Utilized During Treatment: Gait belt;Rolling walker Nurse Communication: Mobility status  Activity Tolerance: Patient tolerated treatment well Patient left: in chair;with call bell/phone within reach;with chair alarm set  OT Visit Diagnosis: Unsteadiness on feet (R26.81);Other abnormalities of gait and mobility (R26.89);Muscle weakness  (generalized) (M62.81);Pain Pain - Right/Left: Right Pain - part of body: Leg                Time: 9179-1505 OT Time Calculation (min): 35 min Charges:  OT General Charges $OT Visit: 1 Visit OT Evaluation $OT Eval Moderate Complexity: 1 Mod OT Treatments $Self Care/Home Management : 8-22 mins  Lonnie [name], OTR/L Acute Rehab Services Office: [phone]   Lonnie [name] 06/10/2021, 10:42 AM

## 2021-06-10 NOTE — Consult Note (Signed)
WOC Nurse wound consult note Consultation was completed by review of records, images and assistance from the bedside nurse/clinical staff.   Reason for Consult: right foot wound Patient admitted with occluded right femoral-popliteal bypass graft and new onset foot wound Wound type: arterial ulceration Pressure Injury POA: NA Measurement: see nursing flow sheet Wound bed: partial thickness skin loss on the dorsal foot/ some yellow fibrinous material at the proximal edge of the wound bed < 20% Drainage (amount, consistency, odor) scant per nursing flow sheets Periwound: intact Dressing procedure/placement/frequency: Continue non adherent dressing (xeroform) antibacterial dressing for protection and insulation, top with dry dressing. Change daily  Discussed POC with bedside nurse.  Re consult if needed, will not follow at this time. Thanks  Samul Mcinroy [employer], RN,CWOCN, CNS, CWON-AP [phone])

## 2021-06-10 NOTE — Evaluation (Signed)
Physical Therapy Evaluation Patient Details Name: Lonnie [name] MRN: [id number] DOB: Apr 03, 1951 Today's Date: 06/10/2021   History of Present Illness  Lonnie [name] is a 70 y.o. male with progressive R foot/calf pain. Duplex revealed occlusion of right femoral to BK popliteal bypass. Acute ischemic skin changes of dorsum of right foot. Pt underwent peripheral vascular thrombectomy of R LE.  Clinical Impression  Pt admitted with/for progressive right foot and calf pain due to occlusion of right femoral to BK pop BPG.  Pt needing minimal assist for basic mobility and gait overall.  His caregiver has contracted covid and may not be available to assist pt as needed, therefore will join OT in asking for initiation of SNF search in case pt does not have adequate assist at home.  However, will work toward home as a d/c destination in the meantime..  Pt currently limited functionally due to the problems listed. ( See problems list.)   Pt will benefit from PT to maximize function and safety in order to get ready for next venue listed below.    Follow Up Recommendations SNF;Supervision/Assistance - 24 hour;Other (comment) (HHPT if pt has adequate assist from caregiver at home)    Equipment Recommendations  None recommended by PT    Recommendations for Other Services       Precautions / Restrictions Precautions Precautions: Fall Restrictions Weight Bearing Restrictions: No      Mobility  Bed Mobility Overal bed mobility: Needs Assistance Bed Mobility: Supine to Sit     Supine to sit: Supervision;HOB elevated     General bed mobility comments: no physical assist needed    Transfers Overall transfer level: Needs assistance Equipment used: Rolling walker (2 wheeled) Transfers: Sit to/from Stand Sit to Stand: Min assist;From elevated surface         General transfer comment: Min A for various sit to stands with RW during ADLs, cues for hand placement and able to take steps  along bedside to chair using RW  Ambulation/Gait Ambulation/Gait assistance: Min assist Gait Distance (Feet): 24 Feet Assistive device: Rolling walker (2 wheeled) Gait Pattern/deviations: Step-to pattern;Decreased step length - right;Decreased step length - left;Decreased stance time - right;Decreased stride length   Gait velocity interpretation: <1.31 ft/sec, indicative of household ambulator General Gait Details: antalgic gait on the right with step to pattern.  Improvement with sequencing and time up though notable decrease in R LE terminal extension in w/bearing with fatigue.  Stairs            Wheelchair Mobility    Modified Rankin (Stroke Patients Only)       Balance Overall balance assessment: Needs assistance Sitting-balance support: No upper extremity supported;Feet supported Sitting balance-Leahy Scale: Good     Standing balance support: Bilateral upper extremity supported;Single extremity supported;During functional activity Standing balance-Leahy Scale: Poor Standing balance comment: reliant on at least one UE support in standing                             Pertinent Vitals/Pain Pain Assessment: Faces Faces Pain Scale: Hurts even more Pain Location: R LE with WB and in dependent position Pain Descriptors / Indicators: Guarding;Grimacing;Sore Pain Intervention(s): Monitored during session    Home Living Family/patient expects to be discharged to:: Private residence Living Arrangements: Spouse/significant other Available Help at Discharge: Family;Personal care attendant;Available PRN/intermittently Type of Home: House Home Access: Ramped entrance     Home Layout: Two level;Able to live on main  level with bedroom/bathroom Home Equipment: Walker - 2 wheels;Tub bench;Hand held shower head;Wheelchair - manual Additional Comments: PCA 3x/wk 1-3:30PM per pt    Prior Function Level of Independence: Needs assistance   Gait / Transfers Assistance  Needed: able to mobilize with RW in the home  ADL's / Homemaking Assistance Needed: PCA assists with shower transfers, bathing and LB dressing tasks. pt reports difficulty reaching B feet        Hand Dominance   Dominant Hand: Right    Extremity/Trunk Assessment   Upper Extremity Assessment Upper Extremity Assessment: Overall WFL for tasks assessed    Lower Extremity Assessment Lower Extremity Assessment: RLE deficits/detail RLE Deficits / Details: painful and grossly weak at 4-/5.  pt able to full extend knee in stance, but fatigues with time up. RLE Coordination: decreased fine motor    Cervical / Trunk Assessment Cervical / Trunk Assessment: Kyphotic  Communication   Communication: No difficulties  Cognition Arousal/Alertness: Awake/alert Behavior During Therapy: WFL for tasks assessed/performed Overall Cognitive Status: History of cognitive impairments - at baseline Area of Impairment: Orientation;Attention;Memory;Awareness                 Orientation Level: Disoriented to;Time Current Attention Level: Selective Memory: Decreased short-term memory     Awareness: Emergent   General Comments: Hx of dementia at baseline, reports he is at [facility] though believed it was [place] location, reports June 2020 but with prompts, corrects self to 2022. Very pleasant, follows all directions with minor cues for safety/pacing      General Comments      Exercises     Assessment/Plan    PT Assessment Patient needs continued PT services  PT Problem List Decreased strength;Decreased activity tolerance;Decreased mobility;Decreased knowledge of use of DME;Pain       PT Treatment Interventions DME instruction;Gait training;Functional mobility training;Stair training;Therapeutic activities;Patient/family education    PT Goals (Current goals can be found in the Care Plan section)  Acute Rehab PT Goals Patient Stated Goal: no pain, pain control PT Goal Formulation:  With patient Time For Goal Achievement: 06/17/21 Potential to Achieve Goals: Good    Frequency Min 3X/week   Barriers to discharge        Co-evaluation               AM-PAC PT "6 Clicks" Mobility  Outcome Measure Help needed turning from your back to your side while in a flat bed without using bedrails?: A Little Help needed moving from lying on your back to sitting on the side of a flat bed without using bedrails?: A Little Help needed moving to and from a bed to a chair (including a wheelchair)?: A Little Help needed standing up from a chair using your arms (e.g., wheelchair or bedside chair)?: A Little Help needed to walk in hospital room?: A Little Help needed climbing 3-5 steps with a railing? : A Little 6 Click Score: 18    End of Session   Activity Tolerance: Patient tolerated treatment well;Patient limited by pain;Patient limited by fatigue Patient left: in chair;with call bell/phone within reach Nurse Communication: Mobility status PT Visit Diagnosis: Other abnormalities of gait and mobility (R26.89);Pain;Difficulty in walking, not elsewhere classified (R26.2) Pain - Right/Left: Right Pain - part of body: Leg    Time: 8099-8338 PT Time Calculation (min) (ACUTE ONLY): 21 min   Charges:   PT Evaluation $PT Eval Moderate Complexity: 1 Mod          06/10/2021  Jacinto Halim., PT  Acute Rehabilitation Services [phone]  (pager) [phone]  (office)  Eliseo Gum Chelsei Mcchesney 06/10/2021, 2:06 PM

## 2021-06-11 DIAGNOSIS — Z95820 Peripheral vascular angioplasty status with implants and grafts: Secondary | ICD-10-CM

## 2021-06-11 NOTE — TOC Initial Note (Signed)
Transition of Care [facility]) - Initial/Assessment Note    Patient Details  Name: Lonnie [name] MRN: [id number] Date of Birth: October 17, 1951  Transition of Care [facility]) CM/SW Contact:    Vinie Sill, LCSW Phone Number: 06/11/2021, 2:14 PM  Clinical Narrative:                 CSW spoke with patient's spouse([name]) and daughter([name]),via phone. CSW introduced self and explained role. CSW discussed disposition and SNF recommendations. Patient's family wants the patient to d/c home w/ [facility] services.  [name] states CNA comes to the home 3 days per week (M/W/TH) for 3 hrs per day. She expressed the patient wouldn't like going to rehab. She reports she was test positive for covid Friday 6/17 and has been getting treatment (medication) since that date- patient's daughter tested positive for covid on Monday 6/13 and has been taking medication since that date.   [name] states they have used [facility] in the past and is agreeable to using them again or any home [facility] agency that can met their needs. She inquired about wound care and the possible need for RN or education of how to care for the patient's wound. She wants 3 in 1 BSC and rolling walker(his walker is bent) - she states patient has received a walker in the last 5 yrs , but she is willing to pay it patient not eligible for another walker.   TOC  will follow up w/medical team regarding when the patient can safely d/c home w/family.  TOC will continue to follow and assist with discharge planning  Thurmond Butts, MSW, LCSW Clinical Social Worker    Expected Discharge Plan: [place] Barriers to Discharge: Family Issues (family has covid)   Patient Goals and CMS Choice        Expected Discharge Plan and Services Expected Discharge Plan: [place]       Living arrangements for the past 2 months: Single Family Home                                      Prior Living  Arrangements/Services Living arrangements for the past 2 months: Single Family Home Lives with:: Self, Spouse Patient language and need for interpreter reviewed:: No        Need for Family Participation in Patient Care: Yes (Comment) Care giver support system in place?: Yes (comment)   Criminal Activity/Legal Involvement Pertinent to Current Situation/Hospitalization: No - Comment as needed  Activities of Daily Living Home Assistive Devices/Equipment: Wheelchair ADL Screening (condition at time of admission) Patient's cognitive ability adequate to safely complete daily activities?: Yes Is the patient deaf or have difficulty hearing?: No Does the patient have difficulty seeing, even when wearing glasses/contacts?: No Does the patient have difficulty concentrating, remembering, or making decisions?: No Patient able to express need for assistance with ADLs?: Yes Does the patient have difficulty dressing or bathing?: No Independently performs ADLs?: Yes (appropriate for developmental age) Does the patient have difficulty walking or climbing stairs?: Yes Weakness of Legs: Right Weakness of Arms/Hands: None  Permission Sought/Granted Permission sought to share information with : Family Supports    Share Information with NAME: Otis Brace Schlink  Permission granted to share info w AGENCY: spouse  Permission granted to share info w Relationship: daughter  Permission granted to share info w Contact Information: [phone]  Emotional Assessment  Alcohol / Substance Use: Not Applicable Psych Involvement: No (comment)  Admission diagnosis:  PAD (peripheral artery disease) ([place]) [I73.9] Patient Active Problem List   Diagnosis Date Noted   Ischemic cardiomyopathy    Sepsis secondary to UTI ([place]) 29/19/1660   Acute metabolic encephalopathy 60/03/5996   Closed fracture of right patella 09/10/2020   PAD (peripheral artery disease) ([place]) 01/20/2020   Fronto-temporal dementia  ([place]) 08/04/2019   Left shoulder pain    TBI (traumatic brain injury) ([place])    E-coli UTI    Urinary retention    Postoperative pain    Agitation    SDH (subdural hematoma) ([place]) 07/08/2019   Subdural hematoma ([place]) 06/30/2019   Erectile dysfunction 07/28/2018   Peripheral vascular disease ([place]) 07/16/2017   Anxiety and depression 07/16/2017   PCP NOTES >>>>>>>>>>>> 07/08/2016   Dermatitis 04/19/2015   Elevated LFTs 02/12/2015   Hyperglycemia 09/01/2014   DJD (degenerative joint disease) 09/01/2014   Annual physical exam 09/01/2014   S/P CABG x 4 11/07/2013   HTN (hypertension) 10/29/2013   Tobacco abuse 10/29/2013   Coronary atherosclerosis of native coronary artery 10/29/2013   PCP:  Colon Branch, MD Pharmacy:   [facility]  ([place], [place]  [address] [place], [address] [place], [place] [address] Phone: [phone] Fax: [phone]  [id number] - [address] [place] AT [facility] [address] [place] [address] Phone: [phone] Fax: [phone]     Social Determinants of Health ([place]) Interventions    Readmission Risk Interventions Readmission Risk Prevention Plan 09/12/2020 03/12/2020 03/05/2020  Transportation Screening Complete - Complete  PCP or Specialist Appt within 3-5 Days Complete - Complete  HRI or Home Care Consult Complete - Complete  Social Work Consult for Recovery Care Planning/Counseling Complete - Complete  Palliative Care Screening Not Applicable - Not Applicable  Medication Review Press photographer) Complete Complete -  Some recent data might be hidden

## 2021-06-11 NOTE — TOC Initial Note (Signed)
Transition of Care [facility]) - Initial/Assessment Note    Patient Details  Name: Lonnie [name] MRN: [id number] Date of Birth: 16-Jul-1951  Transition of Care [facility]) CM/SW Contact:    Eduard Roux, LCSW Phone Number: 06/11/2021, 2:00 PM  Clinical Narrative:                         Patient Goals and CMS Choice        Expected Discharge Plan and Services                                                Prior Living Arrangements/Services                       Activities of Daily Living Home Assistive Devices/Equipment: Wheelchair ADL Screening (condition at time of admission) Patient's cognitive ability adequate to safely complete daily activities?: Yes Is the patient deaf or have difficulty hearing?: No Does the patient have difficulty seeing, even when wearing glasses/contacts?: No Does the patient have difficulty concentrating, remembering, or making decisions?: No Patient able to express need for assistance with ADLs?: Yes Does the patient have difficulty dressing or bathing?: No Independently performs ADLs?: Yes (appropriate for developmental age) Does the patient have difficulty walking or climbing stairs?: Yes Weakness of Legs: Right Weakness of Arms/Hands: None  Permission Sought/Granted                  Emotional Assessment              Admission diagnosis:  PAD (peripheral artery disease) ([facility]) [I73.9] Patient Active Problem List   Diagnosis Date Noted   Ischemic cardiomyopathy    Sepsis secondary to UTI ([facility]) 09/10/2020   Acute metabolic encephalopathy 09/10/2020   Closed fracture of right patella 09/10/2020   PAD (peripheral artery disease) ([facility]) 01/20/2020   Fronto-temporal dementia ([facility]) 08/04/2019   Left shoulder pain    TBI (traumatic brain injury) ([facility])    E-coli UTI    Urinary retention    Postoperative pain    Agitation    SDH (subdural hematoma) ([facility]) 07/08/2019   Subdural hematoma ([facility]) 06/30/2019    Erectile dysfunction 07/28/2018   Peripheral vascular disease ([facility]) 07/16/2017   Anxiety and depression 07/16/2017   PCP NOTES >>>>>>>>>>>> 07/08/2016   Dermatitis 04/19/2015   Elevated LFTs 02/12/2015   Hyperglycemia 09/01/2014   DJD (degenerative joint disease) 09/01/2014   Annual physical exam 09/01/2014   S/P CABG x 4 11/07/2013   HTN (hypertension) 10/29/2013   Tobacco abuse 10/29/2013   Coronary atherosclerosis of native coronary artery 10/29/2013   PCP:  [name], MD Pharmacy:   [facility]  [facility]) [place], [place] - [address] [place], [address] [place], [address] [place]  [address] Phone: [phone] Fax: [phone]  [id number] - [address], [place] - [address] AT [facility] [place] [address] [place] [address] Phone: [phone] Fax: [phone]     Social Determinants of Health (SDOH) Interventions    Readmission Risk Interventions Readmission Risk Prevention Plan 09/12/2020 03/12/2020 03/05/2020  Transportation Screening Complete - Complete  PCP or Specialist Appt within 3-5 Days Complete - Complete  HRI or Home Care Consult Complete - Complete  Social Work  Consult for Recovery Care Planning/Counseling Complete - Complete  Palliative Care Screening Not Applicable - Not Applicable  Medication Review (RN Care Manager) Complete Complete -  Some recent data might be hidden

## 2021-06-11 NOTE — Progress Notes (Addendum)
  Progress Note    06/11/2021 7:42 AM 3 Days Post-Op  Subjective:  says his foot feels better  Afebrile HR 60's-90's NSR 90's-140's systolic 96% RA  Vitals:   06/11/21 0443 06/11/21 0715  BP: 94/68 124/66  Pulse: 71 73  Resp: 18 (!) 24  Temp: 98.6 F (37 C) 98.7 F (37.1 C)  SpO2: 96% 96%    Physical Exam: Cardiac:  regular Lungs:  non labored Extremities:  brisk right peroneal doppler signal   CBC    Component Value Date/Time   WBC 18.7 (H) 06/10/2021 0249   RBC 3.78 (L) 06/10/2021 0249   HGB 8.6 (L) 06/10/2021 0249   HGB 15.7 01/10/2020 1146   HCT 27.6 (L) 06/10/2021 0249   HCT 46.1 01/10/2020 1146   PLT 269 06/10/2021 0249   PLT 232 01/10/2020 1146   MCV 73.0 (L) 06/10/2021 0249   MCV 83 01/10/2020 1146   MCH 22.8 (L) 06/10/2021 0249   MCHC 31.2 06/10/2021 0249   RDW 19.3 (H) 06/10/2021 0249   RDW 12.8 01/10/2020 1146   LYMPHSABS 2,420 05/24/2021 1549   LYMPHSABS 2.5 03/01/2015 1414   MONOABS 1.1 (H) 09/12/2020 0357   EOSABS 1,021 (H) 05/24/2021 1549   EOSABS 0.4 03/01/2015 1414   BASOSABS 155 05/24/2021 1549   BASOSABS 0.1 03/01/2015 1414    BMET    Component Value Date/Time   NA 135 06/10/2021 0249   NA 142 01/10/2020 1146   K 3.6 06/10/2021 0249   CL 103 06/10/2021 0249   CO2 21 (L) 06/10/2021 0249   GLUCOSE 98 06/10/2021 0249   BUN 18 06/10/2021 0249   BUN 19 01/10/2020 1146   CREATININE 0.91 06/10/2021 0249   CREATININE 0.94 05/24/2021 1549   CALCIUM 8.5 (L) 06/10/2021 0249   GFRNONAA >60 06/10/2021 0249   GFRAA >60 09/12/2020 0357    INR    Component Value Date/Time   INR 1.1 09/10/2020 0700     Intake/Output Summary (Last 24 hours) at 06/11/2021 0742 Last data filed at 06/10/2021 1527 Gross per 24 hour  Intake 372 ml  Output 600 ml  Net -228 ml     Assessment/Plan:  70 y.o. male is s/p:  lysis of RLE bypass graft   3 Days Post-Op   -pt with brisk doppler signal right peroneal doppler signal -dressing in place  right foot.  Will return to remove this later this morning.   WOC saw pt yesterday and recommends continuing Xeroform and dry dressing daily.     Doreatha Massed, PA-C [facility] [phone] 06/11/2021 7:42 AM  ADDENDUM: Dressing right foot changed.  See pic below.  Continue Xeroform followed by dry dressing wrap   Doreatha Massed, [facility] 06/11/2021 10:24 AM  I agree with the above.  I have seen and evaluated the patient.  His most recent intervention is patent.  We will continue with wound care using Xeroform to the foot, to be changed daily.  We are awaiting discharge home once his family recovers from COVID-19 infection.  [name]

## 2021-06-12 DIAGNOSIS — Z48812 Encounter for surgical aftercare following surgery on the circulatory system: Secondary | ICD-10-CM

## 2021-06-12 DIAGNOSIS — Z95820 Peripheral vascular angioplasty status with implants and grafts: Secondary | ICD-10-CM

## 2021-06-12 NOTE — Progress Notes (Signed)
Physical Therapy Treatment Patient Details Name: Lonnie [name] MRN: [id number] DOB: [birth year] Today's Date: 06/12/2021    History of Present Illness Lonnie [name] is a 70 y.o. male with progressive R foot/calf pain. Duplex revealed occlusion of right femoral to BK popliteal bypass. Acute ischemic skin changes of dorsum of right foot. Pt underwent peripheral vascular thrombectomy of R LE.    PT Comments    Pt having difficulty progressing toward his goals due to R LE pain and inability to extend his R knee whether in bearing or against gravity.  Emphasis on transfer technique/stability and progression of gait stability.  Pt left with knee in a prolonged stretch position to be in that position for at least 10 min.   Follow Up Recommendations  SNF;Supervision/Assistance - 24 hour;Other (comment) (Still would be better served by rehab before home, but if refuses SNF needs HHPT and initially up to 24 hour assist)     Equipment Recommendations  None recommended by PT    Recommendations for Other Services       Precautions / Restrictions Precautions Precautions: Fall    Mobility  Bed Mobility                    Transfers Overall transfer level: Needs assistance Equipment used: Rolling walker (2 wheeled) Transfers: Sit to/from [employer] Sit to Stand: Mod assist;Min assist (depending of surface/height)         General transfer comment: from the recliner, needed extra assist even with the use of armrest.  from the bed, pt needed min assist and mostly for stability.  Ambulation/Gait Ambulation/Gait assistance: Min assist Gait Distance (Feet): 16 Feet (x2) Assistive device: Rolling walker (2 wheeled) Gait Pattern/deviations: Step-to pattern;Step-through pattern;Decreased step length - right;Decreased step length - left;Decreased stance time - right   Gait velocity interpretation: <1.31 ft/sec, indicative of household ambulator General Gait  Details: heavy use of the RW, soft buckle of R knee with inability to achieve terminal extension whether w/bearing or not.   Stairs             Wheelchair Mobility    Modified Rankin (Stroke Patients Only)       Balance Overall balance assessment: Needs assistance Sitting-balance support: No upper extremity supported;Feet supported Sitting balance-Leahy Scale: Good     Standing balance support: Bilateral upper extremity supported;Single extremity supported;During functional activity Standing balance-Leahy Scale: Poor Standing balance comment: reliant on BUE support in standing + external support                            Cognition Arousal/Alertness: Awake/alert Behavior During Therapy: Flat affect Overall Cognitive Status: History of cognitive impairments - at baseline Area of Impairment: Orientation;Attention;Memory;Problem solving;Awareness;Following commands;Safety/judgement                 Orientation Level: Disoriented to;Time Current Attention Level: Selective Memory: Decreased short-term memory Following Commands: Follows one step commands inconsistently;Follows one step commands with increased time;Follows multi-step commands inconsistently Safety/Judgement: Decreased awareness of deficits;Decreased awareness of safety Awareness: Emergent Problem Solving: Slow processing;Decreased initiation;Requires verbal cues;Difficulty sequencing;Requires tactile cues General Comments: Hx of dementia at baseline, increased difficulty following directions today - multimodal cues required and redirection as pt reported need to use BSC - attempted transfer, got midway and then returned to bed but endorsed continued need to have BM. increased confusion and difficulty completing basic tasks this AM      Exercises  General Comments General comments (skin integrity, edema, etc.): vss      Pertinent Vitals/Pain Pain Assessment: Faces Faces Pain Scale:  Hurts little more Pain Location: R LE with weightbearing Pain Descriptors / Indicators: Guarding;Grimacing;Sore Pain Intervention(s): Monitored during session    Home Living                      Prior Function            PT Goals (current goals can now be found in the care plan section) Acute Rehab PT Goals Patient Stated Goal: go home PT Goal Formulation: With patient Time For Goal Achievement: 06/17/21 Potential to Achieve Goals: Good (to fair) Progress towards PT goals: Progressing toward goals    Frequency    Min 3X/week      PT Plan Current plan remains appropriate    Co-evaluation              AM-PAC PT "6 Clicks" Mobility   Outcome Measure  Help needed turning from your back to your side while in a flat bed without using bedrails?: A Little Help needed moving from lying on your back to sitting on the side of a flat bed without using bedrails?: A Little Help needed moving to and from a bed to a chair (including a wheelchair)?: A Lot Help needed standing up from a chair using your arms (e.g., wheelchair or bedside chair)?: A Lot Help needed to walk in hospital room?: A Little Help needed climbing 3-5 steps with a railing? : A Lot 6 Click Score: 15    End of Session   Activity Tolerance: Patient limited by pain Patient left: in chair;with call bell/phone within reach Nurse Communication: Mobility status PT Visit Diagnosis: Other abnormalities of gait and mobility (R26.89);Pain;Difficulty in walking, not elsewhere classified (R26.2) Pain - Right/Left: Right Pain - part of body: Leg     Time: 8676-7209 PT Time Calculation (min) (ACUTE ONLY): 21 min  Charges:                        06/12/2021  Jacinto Halim., PT Acute Rehabilitation Services [phone]  (pager) [phone]  (office)   Eliseo Gum Lisha Vitale 06/12/2021, 4:59 PM

## 2021-06-12 NOTE — Progress Notes (Addendum)
[facility] [place]  Subjective  - Pain issues.   Objective 109/62 (!) 57 98.2 F (36.8 C) (Oral) 17 98%  Intake/Output Summary (Last 24 hours) at 06/12/2021 0741 Last data filed at 06/11/2021 2219 Gross per 24 hour  Intake 990 ml  Output --  Net 990 ml    Right foot dressing clean and dry Decreased sensation, motor grossly intact right toes. Left groin soft Lungs non labored breathing Doppler PT signal right LE   Assessment/Planning: POD # 4 Lysis right LE bypass  He can not tolerate anticoagulation due to a high fall risk given his dementia Dispo complicated by care givers having covid now. Xeroform and dry dressing daily to right foot dorsum. Pending discharge.  Family wants patient to come home and not SNF.  Mosetta Pigeon 06/12/2021 7:41 AM --  Laboratory Lab Results: Recent Labs    06/09/21 1055 06/09/21 1828 06/10/21 0249  WBC 12.1*  --  18.7*  HGB 5.8* 8.7* 8.6*  HCT 19.7* 28.1* 27.6*  PLT 253  --  269   BMET Recent Labs    06/10/21 0249  NA 135  K 3.6  CL 103  CO2 21*  GLUCOSE 98  BUN 18  CREATININE 0.91  CALCIUM 8.5*    COAG Lab Results  Component Value Date   INR 1.1 09/10/2020   INR 1.0 03/01/2020   INR 0.9 01/20/2020   No results found for: PTT  I agree with the above.  We are awaiting ultimate disposition based off family member's COVID status.  [name]

## 2021-06-12 NOTE — Progress Notes (Signed)
Physical Therapy Treatment Patient Details Name: Lonnie [name] MRN: [id number] DOB: 1951-08-17 Today's Date: 06/12/2021    History of Present Illness Lonnie [name] is a 70 y.o. male with progressive R foot/calf pain. Duplex revealed occlusion of right femoral to BK popliteal bypass. Acute ischemic skin changes of dorsum of right foot. Pt underwent peripheral vascular thrombectomy of R LE.    PT Comments    Slow to progress toward goals.  Limited mobility due to R knee pain and weakness with limited terminal knee extension.  Emphasis on sit to stands and progression of gait stability/stamina.    Follow Up Recommendations  SNF;Supervision/Assistance - 24 hour;Other (comment) (Still would be better served by rehab before home, but if refuses SNF needs HHPT and initially up to 24 hour assist)     Equipment Recommendations  None recommended by PT    Recommendations for Other Services       Precautions / Restrictions Precautions Precautions: Fall    Mobility  Bed Mobility                    Transfers Overall transfer level: Needs assistance Equipment used: Rolling walker (2 wheeled) Transfers: Sit to/from [employer] Sit to Stand: Mod assist;Min assist (depending of surface/height)         General transfer comment: from the recliner, needed extra assist even with the use of armrest.  from the bed, pt needed min assist and mostly for stability.  Ambulation/Gait Ambulation/Gait assistance: Min assist Gait Distance (Feet): 16 Feet (x2) Assistive device: Rolling walker (2 wheeled) Gait Pattern/deviations: Step-to pattern;Step-through pattern;Decreased step length - right;Decreased step length - left;Decreased stance time - right   Gait velocity interpretation: <1.31 ft/sec, indicative of household ambulator General Gait Details: heavy use of the RW, soft buckle of R knee with inability to achieve terminal extension whether w/bearing or  not.   Stairs             Wheelchair Mobility    Modified Rankin (Stroke Patients Only)       Balance Overall balance assessment: Needs assistance Sitting-balance support: No upper extremity supported;Feet supported Sitting balance-Leahy Scale: Good     Standing balance support: Bilateral upper extremity supported;Single extremity supported;During functional activity Standing balance-Leahy Scale: Poor Standing balance comment: reliant on BUE support in standing + external support                            Cognition Arousal/Alertness: Awake/alert Behavior During Therapy: Flat affect Overall Cognitive Status: History of cognitive impairments - at baseline Area of Impairment: Orientation;Attention;Memory;Problem solving;Awareness;Following commands;Safety/judgement                 Orientation Level: Disoriented to;Time Current Attention Level: Selective Memory: Decreased short-term memory Following Commands: Follows one step commands inconsistently;Follows one step commands with increased time;Follows multi-step commands inconsistently Safety/Judgement: Decreased awareness of deficits;Decreased awareness of safety Awareness: Emergent Problem Solving: Slow processing;Decreased initiation;Requires verbal cues;Difficulty sequencing;Requires tactile cues General Comments: Hx of dementia at baseline, increased difficulty following directions today - multimodal cues required and redirection as pt reported need to use BSC - attempted transfer, got midway and then returned to bed but endorsed continued need to have BM. increased confusion and difficulty completing basic tasks this AM      Exercises      General Comments General comments (skin integrity, edema, etc.): vss      Pertinent Vitals/Pain Pain Assessment: Faces Faces  Pain Scale: Hurts little more Pain Location: R LE with weightbearing Pain Descriptors / Indicators: Guarding;Grimacing;Sore Pain  Intervention(s): Monitored during session    Home Living                      Prior Function            PT Goals (current goals can now be found in the care plan section) Acute Rehab PT Goals Patient Stated Goal: go home PT Goal Formulation: With patient Time For Goal Achievement: 06/17/21 Potential to Achieve Goals: Good (to fair) Progress towards PT goals: Progressing toward goals    Frequency    Min 3X/week      PT Plan Current plan remains appropriate    Co-evaluation              AM-PAC PT "6 Clicks" Mobility   Outcome Measure  Help needed turning from your back to your side while in a flat bed without using bedrails?: A Little Help needed moving from lying on your back to sitting on the side of a flat bed without using bedrails?: A Little Help needed moving to and from a bed to a chair (including a wheelchair)?: A Lot Help needed standing up from a chair using your arms (e.g., wheelchair or bedside chair)?: A Lot Help needed to walk in hospital room?: A Little Help needed climbing 3-5 steps with a railing? : A Lot 6 Click Score: 15    End of Session   Activity Tolerance: Patient limited by pain Patient left: in chair;with call bell/phone within reach Nurse Communication: Mobility status PT Visit Diagnosis: Other abnormalities of gait and mobility (R26.89);Pain;Difficulty in walking, not elsewhere classified (R26.2) Pain - Right/Left: Right Pain - part of body: Leg     Time: 0109-3235 PT Time Calculation (min) (ACUTE ONLY): 21 min  Charges:  $Gait Training: 8-22 mins                     06/12/2021  Jacinto Halim., PT Acute Rehabilitation Services [phone]  (pager) [phone]  (office)   [name] 06/12/2021, 6:21 PM

## 2021-06-12 NOTE — Progress Notes (Signed)
Mobility Specialist: Progress Note   06/12/21 1745  Mobility  Activity Transferred:  Chair to bed  Level of Assistance +2 (takes two people)  Assistive Device None  Distance Ambulated (ft) 2 ft  Mobility Out of bed to chair with meals  Mobility Response Tolerated well  Mobility performed by Mobility specialist  $Mobility charge 1 Mobility   Per RN request pt was assisted back to bed from recliner with assistance from RN. Pt was hand held assist requiring verbal cues for feet placement throughout transfer. Pt back in the bed with RN present in the room.   [facility] Valree Feild Mobility Specialist Mobility Specialist Phone: [phone]

## 2021-06-12 NOTE — Care Management Important Message (Signed)
Important Message  Patient Details  Name: Lonnie [name] MRN: [id number] Date of Birth: [birth year]   Medicare Important Message Given:  Yes     Dorena Bodo 06/12/2021, 3:13 PM

## 2021-06-12 NOTE — Progress Notes (Signed)
Occupational Therapy Treatment Patient Details Name: Lonnie [name] MRN: [id number] DOB: 1950-12-28 Today's Date: 06/12/2021    History of present illness Lonnie [name] is a 70 y.o. male with progressive R foot/calf pain. Duplex revealed occlusion of right femoral to BK popliteal bypass. Acute ischemic skin changes of dorsum of right foot. Pt underwent peripheral vascular thrombectomy of R LE.   OT comments  Pt noted with increased confusion, difficulty sequencing and required increased physical assist during session this AM. Pt required overall Mod A for pivot transfers using RW, unable to sequence forward mobility as planned with ADLs. Pt also with difficulty following directions for sequencing to [facility], returning to bed mid-transfer, requiring assist to redirect/re-attempt transfer. Pt also noted to keep eyes closed for most of session, increasing fall risk. When asked if family/caregivers could provide this level of physical assist, pt reports unsure. Recommend SNF for short term rehab based on today's presentation though pt still hopeful to return home. If declines SNF, will need HH therapy follow-up.    Follow Up Recommendations  SNF;Supervision/Assistance - 24 hour    Equipment Recommendations  3 in 1 bedside commode;Wheelchair (measurements OT);Wheelchair cushion (measurements OT)    Recommendations for Other Services      Precautions / Restrictions Precautions Precautions: Fall Restrictions Weight Bearing Restrictions: No       Mobility Bed Mobility Overal bed mobility: Needs Assistance Bed Mobility: Supine to Sit     Supine to sit: Supervision;HOB elevated     General bed mobility comments: no physical assist needed    Transfers Overall transfer level: Needs assistance Equipment used: Rolling walker (2 wheeled) Transfers: Sit to/from [employer] Sit to Stand: Mod assist Stand pivot transfers: Mod assist       General transfer comment:  Varied Mod A to Min A for sit to stands from bedside/BSC based on fatigue. Required Mod A to maintain balance, constant cues for sequencing and manuever RW. Bed <> BSC > back to BSC, side stepping to recliner    Balance Overall balance assessment: Needs assistance Sitting-balance support: No upper extremity supported;Feet supported Sitting balance-Leahy Scale: Good     Standing balance support: Bilateral upper extremity supported;Single extremity supported;During functional activity Standing balance-Leahy Scale: Poor Standing balance comment: reliant on BUE support in standing + external support                           ADL either performed or assessed with clinical judgement   ADL Overall ADL's : Needs assistance/impaired                     Lower Body Dressing: Moderate assistance;Sit to/from stand Lower Body Dressing Details (indicate cue type and reason): Max A to don socks, appears this is pt's baseline Toilet Transfer: Moderate assistance;Stand-pivot;BSC;RW Toilet Transfer Details (indicate cue type and reason): Pt reporting need for BM after initial sit to stand. Placed BSC at L side and attempting to guide pt in transfer to [facility] using RW. Midway through transfer, pt paused, cued for continued sequencing but pt turned and transferred back to bed. Second trial needed with increased sequencing and RW manuevering to transfer to [facility]   Toileting - Clothing Manipulation Details (indicate cue type and reason): once on BSC, declined need for BM anymore       General ADL Comments: Increased confusion, difficulty sequencing and increased physical assist needed. Pt also kept eyes closed for much of session -  decreasing safety     Vision   Vision Assessment?: No apparent visual deficits Additional Comments: though kept eyes closed for most of session   Perception     Praxis      Cognition Arousal/Alertness: Awake/alert Behavior During Therapy: Flat  affect Overall Cognitive Status: History of cognitive impairments - at baseline Area of Impairment: Orientation;Attention;Memory;Problem solving;Awareness;Following commands;Safety/judgement                 Orientation Level: Disoriented to;Time Current Attention Level: Selective Memory: Decreased short-term memory Following Commands: Follows one step commands inconsistently;Follows one step commands with increased time;Follows multi-step commands inconsistently Safety/Judgement: Decreased awareness of deficits;Decreased awareness of safety Awareness: Emergent Problem Solving: Slow processing;Decreased initiation;Requires verbal cues;Difficulty sequencing;Requires tactile cues General Comments: Hx of dementia at baseline, increased difficulty following directions today - multimodal cues required and redirection as pt reported need to use BSC - attempted transfer, got midway and then returned to bed but endorsed continued need to have BM. increased confusion and difficulty completing basic tasks this AM        Exercises     Shoulder Instructions       General Comments VSS on RA    Pertinent Vitals/ Pain       Pain Assessment: Faces Faces Pain Scale: Hurts little more Pain Location: R LE with weightbearing Pain Descriptors / Indicators: Guarding;Grimacing;Sore Pain Intervention(s): Monitored during session  Home Living                                          Prior Functioning/Environment              Frequency  Min 2X/week        Progress Toward Goals  OT Goals(current goals can now be found in the care plan section)  Progress towards OT goals: Not progressing toward goals - comment (increased confusion)  Acute Rehab OT Goals Patient Stated Goal: go home OT Goal Formulation: With patient Time For Goal Achievement: 06/24/21 Potential to Achieve Goals: Good ADL Goals Pt Will Perform Grooming: with modified independence;standing Pt Will  Transfer to Toilet: with modified independence;ambulating Pt Will Perform Toileting - Clothing Manipulation and hygiene: with modified independence;sitting/lateral leans;sit to/from stand Additional ADL Goal #1: Pt/family to verbalize 2 fall prevention strategies to implement at home  Plan Discharge plan needs to be updated    Co-evaluation                 AM-PAC OT "6 Clicks" Daily Activity     Outcome Measure   Help from another person eating meals?: A Little Help from another person taking care of personal grooming?: A Little Help from another person toileting, which includes using toliet, bedpan, or urinal?: A Lot Help from another person bathing (including washing, rinsing, drying)?: A Lot Help from another person to put on and taking off regular upper body clothing?: A Little Help from another person to put on and taking off regular lower body clothing?: A Lot 6 Click Score: 15    End of Session Equipment Utilized During Treatment: Gait belt;Rolling walker  OT Visit Diagnosis: Unsteadiness on feet (R26.81);Other abnormalities of gait and mobility (R26.89);Muscle weakness (generalized) (M62.81);Pain Pain - Right/Left: Right Pain - part of body: Leg   Activity Tolerance Patient tolerated treatment well   Patient Left in chair;with call bell/phone within reach;with chair alarm set   Nurse Communication Mobility  status;Other (comment) (confusion)        Time: 7048-8891 OT Time Calculation (min): 40 min  Charges: OT General Charges $OT Visit: 1 Visit OT Treatments $Self Care/Home Management : 23-37 mins $Therapeutic Activity: 8-22 mins  Bradd Canary, OTR/L Acute Rehab Services Office: [phone]    Lorre Munroe 06/12/2021, 11:24 AM

## 2021-06-13 DIAGNOSIS — Z48812 Encounter for surgical aftercare following surgery on the circulatory system: Secondary | ICD-10-CM

## 2021-06-13 NOTE — Progress Notes (Signed)
    Subjective  -   No overnight events number   Physical Exam:  Groin site remains without hematoma.  Foot is well-perfused.  Currently gentle dressing every.         Assessment/Plan:    Awaiting family to recover from COVID prior to discharge.  Wells Lonnie [name] 06/13/2021 9:48 AM --  Vitals:   06/13/21 0307 06/13/21 0725  BP: 113/67 128/73  Pulse: 70 76  Resp: 18 12  Temp: 98.6 F (37 C) 98.6 F (37 C)  SpO2: 100% 99%   No intake or output data in the 24 hours ending 06/13/21 0948   Laboratory CBC    Component Value Date/Time   WBC 18.7 (H) 06/10/2021 0249   HGB 8.6 (L) 06/10/2021 0249   HGB 15.7 01/10/2020 1146   HCT 27.6 (L) 06/10/2021 0249   HCT 46.1 01/10/2020 1146   PLT 269 06/10/2021 0249   PLT 232 01/10/2020 1146    BMET    Component Value Date/Time   NA 135 06/10/2021 0249   NA 142 01/10/2020 1146   K 3.6 06/10/2021 0249   CL 103 06/10/2021 0249   CO2 21 (L) 06/10/2021 0249   GLUCOSE 98 06/10/2021 0249   BUN 18 06/10/2021 0249   BUN 19 01/10/2020 1146   CREATININE 0.91 06/10/2021 0249   CREATININE 0.94 05/24/2021 1549   CALCIUM 8.5 (L) 06/10/2021 0249   GFRNONAA >60 06/10/2021 0249   GFRAA >60 09/12/2020 0357    COAG Lab Results  Component Value Date   INR 1.1 09/10/2020   INR 1.0 03/01/2020   INR 0.9 01/20/2020   No results found for: PTT  Antibiotics Anti-infectives (From admission, onward)    None        V. Charlena Cross, M.D., [facility] [place] Office: [phone] Pager:  [phone]

## 2021-06-13 NOTE — Progress Notes (Signed)
Physical Therapy Treatment Patient Details Name: Lonnie [name] MRN: [id number] DOB: [birth year] Today's Date: 06/13/2021    History of Present Illness Lonnie [name] is a 70 y.o. male with progressive R foot/calf pain. Duplex revealed occlusion of right femoral to BK popliteal bypass. Acute ischemic skin changes of dorsum of right foot. Pt underwent peripheral vascular thrombectomy of R LE.    PT Comments    Pt received in supine, agreeable to therapy session and with fair participation and tolerance for transfer training. Pt limited due to lethargy/pain and unable to perform gait trial this date. Pt needing heavy +2 modA for stand pivot transfer to/from bedside commode (4 times) due to need for toileting but unable to have BM. Encouraged pt to hydrate and let nsg staff know when he was ready to try again. Discussed pt need for increased physical assist +2 this date and he indicated he may be agreeable to SNF, attempted to call spouse Anne Hahn but phone went to voicemail so unable to discuss. Case mgmt and RN aware of pt increased need for physical assist. Pt continues to benefit from PT services to progress toward functional mobility goals.    Follow Up Recommendations  SNF;Supervision/Assistance - 24 hour;Other (comment) (Still would be better served by rehab before home, but if refuses SNF needs HHPT initial 24 hour assist for all mobility.)     Equipment Recommendations  None recommended by PT;Other (comment) (BSC if family does not have one, per chart review they have RW, tub bench, ramp and manual wheelchair)    Recommendations for Other Services       Precautions / Restrictions Precautions Precautions: Fall Restrictions Weight Bearing Restrictions: No    Mobility  Bed Mobility Overal bed mobility: Needs Assistance Bed Mobility: Supine to Sit;Sit to Supine     Supine to sit: Min assist;HOB elevated Sit to supine: Min assist;+2 for safety/equipment   General bed  mobility comments: pt needs cues for sequencing and minA for trunk rise/BLE assist to/from EOB, increased time and cues to open eyes.    Transfers Overall transfer level: Needs assistance Equipment used: Rolling walker (2 wheeled) Transfers: Sit to/from [employer] Sit to Stand: Mod assist;+2 physical assistance;From elevated surface (from EOB modA +2, from elevated BSC modA +1) Stand pivot transfers: Mod assist;+2 physical assistance;+2 safety/equipment       General transfer comment: EOB<>BSC x2 trials each (4 total stand pivots), max cues and at times needs R knee blocked due to difficulty stepping with LLE from RLE pain/partial buckling; pt maintains eyes closed despite cues for eyes open.  Ambulation/Gait             General Gait Details: pt unable to progress past stand pivot transfers ~35ft ea x4 trials due to RLE pain and buckling when attempting steps with LLE   Stairs             Wheelchair Mobility    Modified Rankin (Stroke Patients Only)       Balance Overall balance assessment: Needs assistance Sitting-balance support: No upper extremity supported;Feet supported Sitting balance-Leahy Scale: Good   Postural control: Posterior lean Standing balance support: Bilateral upper extremity supported;Single extremity supported;During functional activity Standing balance-Leahy Scale: Poor Standing balance comment: Reliant on BUE support in standing + external support +2 (posterior lean)                            Cognition Arousal/Alertness: Lethargic;Suspect due to medications  Behavior During Therapy: Flat affect Overall Cognitive Status: No family/caregiver present to determine baseline cognitive functioning (hx dementia but seems more confused) Area of Impairment: Orientation;Attention;Memory;Problem solving;Awareness;Following commands;Safety/judgement                 Orientation Level: Disoriented to;Time Current  Attention Level: Focused Memory: Decreased short-term memory Following Commands: Follows one step commands inconsistently;Follows one step commands with increased time;Follows multi-step commands inconsistently Safety/Judgement: Decreased awareness of deficits;Decreased awareness of safety Awareness: Intellectual Problem Solving: Slow processing;Decreased initiation;Requires verbal cues;Difficulty sequencing;Requires tactile cues General Comments: Hx of dementia at baseline, increased difficulty following directions today - multimodal cues required. Pt endorsed need to have BM but once on toilet, unable to have one >10 mins so returned to bed, then pt immediately stating he again needs to have BM to back to [facility] but still unable. Pt with increased confusion and difficulty completing basic tasks today, maintains eyes closed for most of session (did have oxy >1 hr prior to session)      Exercises      General Comments General comments (skin integrity, edema, etc.): HR 75-82 bpm, BP not assessed stable prior to mobility per chart review and no acute s/sx distress or dizziness with transfers      Pertinent Vitals/Pain Pain Assessment: Faces Faces Pain Scale: Hurts even more Pain Location: R LE with weightbearing (difficulty stepping with LLE due to R pain) Pain Descriptors / Indicators: Guarding;Grimacing;Sore Pain Intervention(s): Limited activity within patient's tolerance;Monitored during session;Premedicated before session;Repositioned    Home Living                      Prior Function            PT Goals (current goals can now be found in the care plan section) Acute Rehab PT Goals Patient Stated Goal: go home PT Goal Formulation: With patient Time For Goal Achievement: 06/17/21 Progress towards PT goals: Not progressing toward goals - comment (increased physical assist needed; lethargic)    Frequency    Min 3X/week      PT Plan Current plan remains appropriate     Co-evaluation              AM-PAC PT "6 Clicks" Mobility   Outcome Measure  Help needed turning from your back to your side while in a flat bed without using bedrails?: A Little Help needed moving from lying on your back to sitting on the side of a flat bed without using bedrails?: A Little Help needed moving to and from a bed to a chair (including a wheelchair)?: A Lot Help needed standing up from a chair using your arms (e.g., wheelchair or bedside chair)?: A Lot Help needed to walk in hospital room?: Total Help needed climbing 3-5 steps with a railing? : Total 6 Click Score: 12    End of Session Equipment Utilized During Treatment: Gait belt Activity Tolerance: Patient limited by pain;Patient limited by lethargy Patient left: with call bell/phone within reach;in bed;with bed alarm set Nurse Communication: Mobility status;Other (comment) (pt needing increased assist for transfers and more drowsy after pain meds compared with previous date) PT Visit Diagnosis: Other abnormalities of gait and mobility (R26.89);Pain;Difficulty in walking, not elsewhere classified (R26.2) Pain - Right/Left: Right Pain - part of body: Leg     Time: 9924-2683 PT Time Calculation (min) (ACUTE ONLY): 33 min  Charges:  $Therapeutic Activity: 23-37 mins  Florina Ou., PTA Acute Rehabilitation Services Pager: [phone] Office: [phone]    Angus Palms 06/13/2021, 12:09 PM

## 2021-06-13 NOTE — Progress Notes (Signed)
Per conversation with patients wife, she completed all her "COVID" medicine on 06/11/21 and she is feeling fine; stating no fever since Friday 06/17 and no coughing/congestion.

## 2021-06-14 DIAGNOSIS — Z48812 Encounter for surgical aftercare following surgery on the circulatory system: Secondary | ICD-10-CM

## 2021-06-14 MED ORDER — OXYCODONE HCL 5 MG PO TABS: 5.0000 mg | ORAL_TABLET | Freq: Four times a day (QID) | ORAL | 0 refills | Status: DC | PRN

## 2021-06-14 NOTE — Progress Notes (Signed)
Occupational Therapy Treatment Patient Details Name: Lonnie [name] MRN: [id number] DOB: Oct 02, 1951 Today's Date: 06/14/2021    History of present illness Lonnie [name] is a 70 y.o. male with progressive R foot/calf pain. Duplex revealed occlusion of right femoral to BK popliteal bypass. Acute ischemic skin changes of dorsum of right foot. Pt underwent peripheral vascular thrombectomy of R LE.   OT comments  Trialed session without pain medication with improvements noted in physical abilities and cognition. Pt continues to demo confusion but able to mobilize around room on a Min A x 1-2 basis using RW. Pt requires consistent cues for posture, keeping eyes open and hand placement during transitional movements to decrease fall risk. Pt with minimal reports of pain during today's activities. Continue to believe pt would benefit from SNF rehab prior to return home. However, if pt/family continue to decline SNF, would recommend HHOT follow-up to maximize safety/independence in the home.    Follow Up Recommendations  SNF;Supervision/Assistance - 24 hour    Equipment Recommendations  3 in 1 bedside commode;Wheelchair (measurements OT);Wheelchair cushion (measurements OT)    Recommendations for Other Services      Precautions / Restrictions Precautions Precautions: Fall Restrictions Weight Bearing Restrictions: No       Mobility Bed Mobility Overal bed mobility: Needs Assistance Bed Mobility: Supine to Sit     Supine to sit: Min assist;HOB elevated     General bed mobility comments: Min A to scoot hips to EOB    Transfers Overall transfer level: Needs assistance Equipment used: Rolling walker (2 wheeled) Transfers: Sit to/from Stand Sit to Stand: Min assist;+2 physical assistance;+2 safety/equipment         General transfer comment: Min A x 1-2 for sit to stands from bedside, BSC at opposite side of room. Pt requires consistent cues for hand placement and posture     Balance Overall balance assessment: Needs assistance Sitting-balance support: No upper extremity supported;Feet supported Sitting balance-Leahy Scale: Good   Postural control: Posterior lean Standing balance support: Bilateral upper extremity supported;Single extremity supported;During functional activity Standing balance-Leahy Scale: Poor Standing balance comment: reliant on UE support in standing + external support at times                           ADL either performed or assessed with clinical judgement   ADL Overall ADL's : Needs assistance/impaired     Grooming: Min guard;Sitting;Wash/dry face               Lower Body Dressing: Moderate assistance;Sit to/from stand Lower Body Dressing Details (indicate cue type and reason): Max A to don socks, appears this is pt's baseline Toilet Transfer: Minimal assistance;+2 for safety/equipment;Ambulation;BSC;RW Toilet Transfer Details (indicate cue type and reason): Min A x 1-2 for mobility around bedside to [facility] placed at other side of room for mobility Toileting- Clothing Manipulation and Hygiene: Moderate assistance;Sit to/from stand Toileting - Clothing Manipulation Details (indicate cue type and reason): Assist for hygiene in standing due to reliant on UE support     Functional mobility during ADLs: Minimal assistance;+2 for safety/equipment;Rolling walker;Cueing for sequencing;Cueing for safety General ADL Comments: Improved though still limited by confusion, lethargy. Had not received pain meds prior to session and this seemed to help pt's performance     Vision   Vision Assessment?: No apparent visual deficits   Perception     Praxis      Cognition Arousal/Alertness: Lethargic Behavior During Therapy: Flat affect  Overall Cognitive Status: No family/caregiver present to determine baseline cognitive functioning Area of Impairment: Attention;Memory;Problem solving;Awareness;Following  commands;Safety/judgement                   Current Attention Level: Sustained Memory: Decreased short-term memory Following Commands: Follows one step commands inconsistently;Follows one step commands with increased time;Follows multi-step commands inconsistently Safety/Judgement: Decreased awareness of deficits;Decreased awareness of safety Awareness: Intellectual Problem Solving: Slow processing;Decreased initiation;Requires verbal cues;Difficulty sequencing;Requires tactile cues General Comments: Hx of dementia at baseline, increased time to follow commands. requires cues to keep eyes open during session but improved from previous session. memory deficits, as reporting need for BM after just transferring off of BSC. brief moment of agitation with cues for hand placement. had not received pain meds prior to session and this may have improved cognition/physical abilities?        Exercises     Shoulder Instructions       General Comments      Pertinent Vitals/ Pain       Pain Assessment: Faces Faces Pain Scale: Hurts a little bit Pain Location: generalized, R LE,  back with activity Pain Descriptors / Indicators: Grimacing Pain Intervention(s): Monitored during session  Home Living                                          Prior Functioning/Environment              Frequency  Min 2X/week        Progress Toward Goals  OT Goals(current goals can now be found in the care plan section)  Progress towards OT goals: Progressing toward goals  Acute Rehab OT Goals Patient Stated Goal: go home OT Goal Formulation: With patient Time For Goal Achievement: 06/24/21 Potential to Achieve Goals: Good ADL Goals Pt Will Perform Grooming: with modified independence;standing Pt Will Transfer to Toilet: with modified independence;ambulating Pt Will Perform Toileting - Clothing Manipulation and hygiene: with modified independence;sitting/lateral leans;sit  to/from stand Additional ADL Goal #1: Pt/family to verbalize 2 fall prevention strategies to implement at home  Plan Discharge plan remains appropriate    Co-evaluation    PT/OT/SLP Co-Evaluation/Treatment: Yes Reason for Co-Treatment: Complexity of the patient's impairments (multi-system involvement);For patient/therapist safety;To address functional/ADL transfers (recent hx of increased physical assist)   OT goals addressed during session: ADL's and self-care      AM-PAC OT "6 Clicks" Daily Activity     Outcome Measure   Help from another person eating meals?: A Little Help from another person taking care of personal grooming?: A Little Help from another person toileting, which includes using toliet, bedpan, or urinal?: A Lot Help from another person bathing (including washing, rinsing, drying)?: A Lot Help from another person to put on and taking off regular upper body clothing?: A Little Help from another person to put on and taking off regular lower body clothing?: A Lot 6 Click Score: 15    End of Session Equipment Utilized During Treatment: Gait belt;Rolling walker  OT Visit Diagnosis: Unsteadiness on feet (R26.81);Other abnormalities of gait and mobility (R26.89);Muscle weakness (generalized) (M62.81);Pain   Activity Tolerance Patient tolerated treatment well   Patient Left in chair;with call bell/phone within reach;with chair alarm set   Nurse Communication Mobility status;Other (comment) (cognition w/o pain meds)        Time: 1696-7893 OT Time Calculation (min): 28 min  Charges: OT  General Charges $OT Visit: 1 Visit OT Treatments $Self Care/Home Management : 8-22 mins  Bradd Canary, OTR/L Acute Rehab Services Office: [phone]    Lorre Munroe 06/14/2021, 1:08 PM

## 2021-06-14 NOTE — Progress Notes (Signed)
Physical Therapy Treatment Patient Details Name: Lonnie [name] MRN: [id number] DOB: Jan 31, 1951 Today's Date: 06/14/2021    History of Present Illness MUHANNAD [name] is a 70 y.o. male with progressive R foot/calf pain. Duplex revealed occlusion of right femoral to BK popliteal bypass. Acute ischemic skin changes of dorsum of right foot. Pt underwent peripheral vascular thrombectomy of R LE.    PT Comments    Pt received in supine, somewhat drowsy but oriented to self/location/time and agreeable to therapy session, with good participation and tolerance for gait and transfer training. Pt less drowsy this date than previous session (of note pt did NOT have oxycodone today prior to therapy)  and able to progress gait distance up to 52ft with RW and +1-2 minA (chair follow for safety). Pt with poor safety awareness and needs up to +2 assist for transfers, pt impulsive to sit before reaching chair and attempts to sit on armrest without heavy cues each transfer. Pt continues to benefit from PT services to progress toward functional mobility goals.   Follow Up Recommendations  SNF;Supervision/Assistance - 24 hour;Other (comment) (Still would be better served by rehab before home, but if refuses SNF needs HHPT initial 24 hour assist for all mobility)     Equipment Recommendations  None recommended by PT;Other (comment) (BSC if family does not have one, per chart review they have RW, tub bench, ramp and manual wheelchair)    Recommendations for Other Services       Precautions / Restrictions Precautions Precautions: Fall Precaution Comments: waxing/waning alertness Restrictions Weight Bearing Restrictions: No    Mobility  Bed Mobility Overal bed mobility: Needs Assistance Bed Mobility: Supine to Sit     Supine to sit: Min assist;HOB elevated     General bed mobility comments: Min A to scoot hips to EOB, greatly increased time to perform.    Transfers Overall transfer level:  Needs assistance Equipment used: Rolling walker (2 wheeled) Transfers: Sit to/from Stand Sit to Stand: Min assist;+2 physical assistance;+2 safety/equipment;From elevated surface         General transfer comment: Min A x 1-2 for sit to stands from elevated height bedside and from [facility] at opposite side of room. Pt requires consistent cues for hand placement and posture  Ambulation/Gait Ambulation/Gait assistance: Min assist;+2 safety/equipment Gait Distance (Feet): 20 Feet Assistive device: Rolling walker (2 wheeled) Gait Pattern/deviations: Step-to pattern;Step-through pattern;Decreased step length - left;Decreased stance time - right;Trunk flexed Gait velocity: <0.2 m/s Gait velocity interpretation: <1.8 ft/sec, indicate of risk for recurrent falls General Gait Details: no buckling this date and pt able to advance RW but still needs mod cues for safety and upright posture as well as to keep eyes open, pt remains drowsy (but less drowsy than previous session).   Stairs             Wheelchair Mobility    Modified Rankin (Stroke Patients Only)       Balance Overall balance assessment: Needs assistance Sitting-balance support: No upper extremity supported;Feet supported Sitting balance-Leahy Scale: Good   Postural control: Posterior lean Standing balance support: Bilateral upper extremity supported;Single extremity supported;During functional activity Standing balance-Leahy Scale: Poor Standing balance comment: reliant on UE support in standing + external support at times, flexed trunk throughout despite postural cues                            Cognition Arousal/Alertness: Lethargic (slightly improved from previous session; did not have  oxy this date prior to PT session) Behavior During Therapy: Flat affect Overall Cognitive Status: No family/caregiver present to determine baseline cognitive functioning Area of Impairment: Attention;Memory;Problem  solving;Awareness;Following commands;Safety/judgement                 Orientation Level:  (oriented to month/year with increased time) Current Attention Level: Sustained Memory: Decreased short-term memory (poor memory for hand placement) Following Commands: Follows one step commands inconsistently;Follows one step commands with increased time;Follows multi-step commands inconsistently Safety/Judgement: Decreased awareness of deficits;Decreased awareness of safety Awareness: Intellectual Problem Solving: Slow processing;Decreased initiation;Requires verbal cues;Difficulty sequencing;Requires tactile cues General Comments: Hx of dementia at baseline, increased time to follow commands. Requires cues to keep eyes open during session but improved from previous session. Memory deficits, as reporting need for BM after just transferring off of BSC. Brief moment of agitation with cues for hand placement. Pt had not received pain meds prior to session and this may have improved cognition/physical abilities?      Exercises General Exercises - Lower Extremity Ankle Circles/Pumps: AROM;Both;10 reps;Seated Long Arc Quad: AROM;Both;5 reps;Seated    General Comments General comments (skin integrity, edema, etc.): HR 70's bpm and BP 118/63 (80), no dizziness reported; pt confused and reports he needs to have BM after he unsuccessfully tried for 5 mins on BSC and had just stood up (also did this yesterday).      Pertinent Vitals/Pain Pain Assessment: Faces Faces Pain Scale: Hurts a little bit Pain Location: generalized, R LE,  back with activity Pain Descriptors / Indicators: Grimacing Pain Intervention(s): Monitored during session;Repositioned    Home Living                      Prior Function            PT Goals (current goals can now be found in the care plan section) Acute Rehab PT Goals Patient Stated Goal: go home PT Goal Formulation: With patient Time For Goal Achievement:  06/17/21 Progress towards PT goals: Progressing toward goals    Frequency    Min 3X/week      PT Plan Current plan remains appropriate    Co-evaluation PT/OT/SLP Co-Evaluation/Treatment: Yes Reason for Co-Treatment: Complexity of the patient's impairments (multi-system involvement);Necessary to address cognition/behavior during functional activity;For patient/therapist safety;To address functional/ADL transfers PT goals addressed during session: Balance;Mobility/safety with mobility;Proper use of DME OT goals addressed during session: ADL's and self-care      AM-PAC PT "6 Clicks" Mobility   Outcome Measure  Help needed turning from your back to your side while in a flat bed without using bedrails?: A Little Help needed moving from lying on your back to sitting on the side of a flat bed without using bedrails?: A Little Help needed moving to and from a bed to a chair (including a wheelchair)?: A Lot Help needed standing up from a chair using your arms (e.g., wheelchair or bedside chair)?: A Lot Help needed to walk in hospital room?: A Lot Help needed climbing 3-5 steps with a railing? : Total 6 Click Score: 13    End of Session Equipment Utilized During Treatment: Gait belt Activity Tolerance: Patient limited by lethargy;Patient tolerated treatment well Patient left: in chair;with chair alarm set;with call bell/phone within reach;Other (comment) (heels floated) Nurse Communication: Mobility status;Other (comment) (improvement today and was less drowsy since he didnt have oxy prior to mobilizing.) PT Visit Diagnosis: Other abnormalities of gait and mobility (R26.89);Pain;Difficulty in walking, not elsewhere classified (R26.2) Pain -  Right/Left: Right Pain - part of body: Leg     Time: 1223-1249 PT Time Calculation (min) (ACUTE ONLY): 26 min  Charges:  $Gait Training: 8-22 mins                     Ordean Fouts P., PTA Acute Rehabilitation Services Pager: [phone] Office:  [phone]    Angus Palms 06/14/2021, 2:06 PM

## 2021-06-14 NOTE — Progress Notes (Addendum)
[facility]  Subjective  - No change   Objective (!) 146/75 69 98.7 F (37.1 C) (Oral) 16 98%  Intake/Output Summary (Last 24 hours) at 06/14/2021 0725 Last data filed at 06/14/2021 0525 Gross per 24 hour  Intake 720 ml  Output 1150 ml  Net -430 ml     Doppler PT signal right LE Left groin soft without hematoma Lungs non labored breathing  Assessment/Planning: POD # 6 Lysis right LE bypass  Plan for him to go home with [facility].  Family has COVID waiting until they are ready to receive him. ASA and Plavix, no anticoagulation due to dementia and fall risk. Daily dressing changes with xeroform guaze and light ace. Doppler signal intact right LE well perfused  Mosetta Pigeon 06/14/2021 7:25 AM --  Laboratory Lab Results: No results for input(s): WBC, HGB, HCT, PLT in the last 72 hours. BMET No results for input(s): NA, K, CL, CO2, GLUCOSE, BUN, CREATININE, CALCIUM in the last 72 hours.  COAG Lab Results  Component Value Date   INR 1.1 09/10/2020   INR 1.0 03/01/2020   INR 0.9 01/20/2020   No results found for: PTT  I agree with above.  Have seen the patient.  We are waiting discharge home once his family recovers from COVID.  I will speak with him later today.  [name]

## 2021-06-14 NOTE — TOC Transition Note (Signed)
Transition of Care (TOC) - CM/SW Discharge Note Donn Pierini RN, BSN Transitions of Care Unit 4E- RN Case Manager See Treatment Team for direct phone #    Patient Details  Name: SRIMAN TALLY MRN: [id number] Date of Birth: 11/16/1951  Transition of Care [facility]) CM/SW Contact:  Darrold Span, RN Phone Number: 06/14/2021, 3:03 PM   Clinical Narrative:    Pt stable for transition home, have confirmed with wife that she has no COVID symptoms, and is feeling better. Wife states she is ready to have pt return home with [facility] services. Confirmed choice for [facility] agency as [facility] if they are unable to accept wife states she does not have any further preference and will defer to [facility] to secure on pt's behalf.  Wife requesting RW and 3n1- understands she will pay out of pocket or RW- will have DME delivered to the home as pt to transport by EMS.   Confirmed address for transport in epic as correct with wife.   Call made to [employer] for DME needs- RW and 3n1- they will drop ship to home- and call wife regarding payment needs.   Call made to [facility] with [facility]- referral has been accepted for HHRN/PT needs.   1340- [facility] called for transport- pt has been placed on list for transport with several folks ahead of him. Wife called and updated that transport had been called, wife request to verify phone and adaptor are in belongings bag (bedside RN has confirmed)- will also send home some drsg supplies to start them out with until [facility] does start of care visit.  Paperwork for [facility] placed on shadow chart.    Final next level of care: Home w Home Health Services Barriers to Discharge: Barriers Resolved   Patient Goals and CMS Choice Patient states their goals for this hospitalization and ongoing recovery are:: return home CMS Medicare.gov Compare Post Acute Care list provided to:: Patient Represenative (must comment) Choice offered to / list presented to : Spouse  Discharge Placement                Home with [facility]        Discharge Plan and Services In-house Referral: Clinical Social Work Discharge Planning Services: CM Consult Post Acute Care Choice: Durable Medical Equipment, Home Health          DME Arranged: 3-N-1, Walker rolling DME Agency: [facility] Date DME Agency Contacted: 06/14/21 Time DME Agency Contacted: 1230 Representative spoke with at DME Agency: Velna Hatchet HH Arranged: RN, PT [facility] Agency: [facility] Date HH Agency Contacted: 06/14/21 Time HH Agency Contacted: 1200 Representative spoke with at [facility] Agency: Pearson Grippe  Social Determinants of Health (SDOH) Interventions     Readmission Risk Interventions Readmission Risk Prevention Plan 06/14/2021 09/12/2020 03/12/2020  Post Dischage Appt Complete - -  Medication Screening Complete - -  Transportation Screening Complete Complete -  PCP or Specialist Appt within 3-5 Days - Complete -  HRI or Home Care Consult - Complete -  Social Work Consult for Recovery Care Planning/Counseling - Complete -  Palliative Care Screening - Not Applicable -  Medication Review Oceanographer) - Complete Complete  Some recent data might be hidden

## 2021-06-14 NOTE — Progress Notes (Signed)
D/c tele and IVS. Pt going home via [facility].

## 2021-06-14 NOTE — Progress Notes (Signed)
Mobility Specialist - Progress Note   06/14/21 1617  Mobility  Activity Transferred:  Chair to bed  Level of Assistance +2 (takes two people)  Solicitor Response Tolerated well  Mobility performed by Mobility specialist;Nurse  $Mobility charge 1 Mobility   Pt was +2 assist to pivot from recliner to bed. Min assist for bed mobility. Pt left in bed w/ RN in room. VSS throughout.   Mamie Levers Mobility Specialist Mobility Specialist Phone: [phone]

## 2021-06-15 ENCOUNTER — Other Ambulatory Visit: Payer: Self-pay

## 2021-06-15 ENCOUNTER — Emergency Department (HOSPITAL_COMMUNITY): Payer: Medicare Other

## 2021-06-15 ENCOUNTER — Inpatient Hospital Stay (HOSPITAL_COMMUNITY)
Admission: EM | Admit: 2021-06-15 | Discharge: 2021-07-12 | DRG: 239 | Disposition: A | Discharge status: Skilled Nursing Facility | Payer: Medicare Other | Attending: Family Medicine | Admitting: Family Medicine

## 2021-06-15 ENCOUNTER — Encounter (HOSPITAL_COMMUNITY): Payer: Self-pay | Admitting: Emergency Medicine

## 2021-06-15 DIAGNOSIS — Z139 Encounter for screening, unspecified: Secondary | ICD-10-CM

## 2021-06-15 DIAGNOSIS — Z720 Tobacco use: Secondary | ICD-10-CM | POA: Diagnosis not present

## 2021-06-15 DIAGNOSIS — I5042 Chronic combined systolic (congestive) and diastolic (congestive) heart failure: Secondary | ICD-10-CM | POA: Diagnosis not present

## 2021-06-15 DIAGNOSIS — F419 Anxiety disorder, unspecified: Secondary | ICD-10-CM | POA: Diagnosis not present

## 2021-06-15 DIAGNOSIS — G9341 Metabolic encephalopathy: Secondary | ICD-10-CM | POA: Diagnosis present

## 2021-06-15 DIAGNOSIS — E785 Hyperlipidemia, unspecified: Secondary | ICD-10-CM | POA: Diagnosis present

## 2021-06-15 DIAGNOSIS — I9762 Postprocedural hemorrhage of a circulatory system organ or structure following other procedure: Secondary | ICD-10-CM | POA: Diagnosis not present

## 2021-06-15 DIAGNOSIS — K760 Fatty (change of) liver, not elsewhere classified: Secondary | ICD-10-CM | POA: Diagnosis present

## 2021-06-15 DIAGNOSIS — I2510 Atherosclerotic heart disease of native coronary artery without angina pectoris: Secondary | ICD-10-CM | POA: Diagnosis not present

## 2021-06-15 DIAGNOSIS — T83511A Infection and inflammatory reaction due to indwelling urethral catheter, initial encounter: Secondary | ICD-10-CM | POA: Diagnosis not present

## 2021-06-15 DIAGNOSIS — N390 Urinary tract infection, site not specified: Secondary | ICD-10-CM | POA: Diagnosis not present

## 2021-06-15 DIAGNOSIS — E861 Hypovolemia: Secondary | ICD-10-CM | POA: Diagnosis not present

## 2021-06-15 DIAGNOSIS — R338 Other retention of urine: Secondary | ICD-10-CM | POA: Diagnosis not present

## 2021-06-15 DIAGNOSIS — Z8616 Personal history of COVID-19: Secondary | ICD-10-CM | POA: Diagnosis not present

## 2021-06-15 DIAGNOSIS — Z89611 Acquired absence of right leg above knee: Secondary | ICD-10-CM | POA: Diagnosis not present

## 2021-06-15 DIAGNOSIS — T827XXA Infection and inflammatory reaction due to other cardiac and vascular devices, implants and grafts, initial encounter: Secondary | ICD-10-CM

## 2021-06-15 DIAGNOSIS — I5030 Unspecified diastolic (congestive) heart failure: Secondary | ICD-10-CM | POA: Diagnosis not present

## 2021-06-15 DIAGNOSIS — D638 Anemia in other chronic diseases classified elsewhere: Secondary | ICD-10-CM | POA: Diagnosis not present

## 2021-06-15 DIAGNOSIS — D509 Iron deficiency anemia, unspecified: Secondary | ICD-10-CM | POA: Diagnosis not present

## 2021-06-15 DIAGNOSIS — K7200 Acute and subacute hepatic failure without coma: Secondary | ICD-10-CM | POA: Diagnosis not present

## 2021-06-15 DIAGNOSIS — I70661 Atherosclerosis of nonbiological bypass graft(s) of the extremities with gangrene, right leg: Secondary | ICD-10-CM | POA: Diagnosis present

## 2021-06-15 DIAGNOSIS — N179 Acute kidney failure, unspecified: Secondary | ICD-10-CM | POA: Diagnosis not present

## 2021-06-15 DIAGNOSIS — R748 Abnormal levels of other serum enzymes: Secondary | ICD-10-CM

## 2021-06-15 DIAGNOSIS — E871 Hypo-osmolality and hyponatremia: Secondary | ICD-10-CM | POA: Diagnosis not present

## 2021-06-15 DIAGNOSIS — R578 Other shock: Secondary | ICD-10-CM | POA: Diagnosis not present

## 2021-06-15 DIAGNOSIS — R4182 Altered mental status, unspecified: Secondary | ICD-10-CM | POA: Diagnosis not present

## 2021-06-15 DIAGNOSIS — T82838A Hemorrhage of vascular prosthetic devices, implants and grafts, initial encounter: Secondary | ICD-10-CM | POA: Diagnosis not present

## 2021-06-15 DIAGNOSIS — D75839 Thrombocytosis, unspecified: Secondary | ICD-10-CM | POA: Diagnosis not present

## 2021-06-15 DIAGNOSIS — I10 Essential (primary) hypertension: Secondary | ICD-10-CM | POA: Diagnosis not present

## 2021-06-15 DIAGNOSIS — U071 COVID-19: Secondary | ICD-10-CM | POA: Diagnosis present

## 2021-06-15 DIAGNOSIS — I96 Gangrene, not elsewhere classified: Secondary | ICD-10-CM | POA: Diagnosis not present

## 2021-06-15 DIAGNOSIS — Z951 Presence of aortocoronary bypass graft: Secondary | ICD-10-CM | POA: Diagnosis not present

## 2021-06-15 DIAGNOSIS — L089 Local infection of the skin and subcutaneous tissue, unspecified: Secondary | ICD-10-CM | POA: Diagnosis not present

## 2021-06-15 DIAGNOSIS — F0390 Unspecified dementia without behavioral disturbance: Secondary | ICD-10-CM | POA: Diagnosis present

## 2021-06-15 DIAGNOSIS — D72829 Elevated white blood cell count, unspecified: Secondary | ICD-10-CM | POA: Diagnosis not present

## 2021-06-15 DIAGNOSIS — N401 Enlarged prostate with lower urinary tract symptoms: Secondary | ICD-10-CM | POA: Diagnosis present

## 2021-06-15 DIAGNOSIS — R531 Weakness: Secondary | ICD-10-CM | POA: Diagnosis not present

## 2021-06-15 DIAGNOSIS — Z833 Family history of diabetes mellitus: Secondary | ICD-10-CM

## 2021-06-15 DIAGNOSIS — F32A Depression, unspecified: Secondary | ICD-10-CM | POA: Diagnosis present

## 2021-06-15 DIAGNOSIS — B965 Pseudomonas (aeruginosa) (mallei) (pseudomallei) as the cause of diseases classified elsewhere: Secondary | ICD-10-CM | POA: Diagnosis not present

## 2021-06-15 DIAGNOSIS — K219 Gastro-esophageal reflux disease without esophagitis: Secondary | ICD-10-CM | POA: Diagnosis present

## 2021-06-15 DIAGNOSIS — J189 Pneumonia, unspecified organism: Secondary | ICD-10-CM

## 2021-06-15 DIAGNOSIS — K759 Inflammatory liver disease, unspecified: Secondary | ICD-10-CM

## 2021-06-15 DIAGNOSIS — D62 Acute posthemorrhagic anemia: Secondary | ICD-10-CM | POA: Diagnosis not present

## 2021-06-15 DIAGNOSIS — R7881 Bacteremia: Secondary | ICD-10-CM

## 2021-06-15 DIAGNOSIS — I739 Peripheral vascular disease, unspecified: Secondary | ICD-10-CM | POA: Diagnosis not present

## 2021-06-15 DIAGNOSIS — A4152 Sepsis due to Pseudomonas: Secondary | ICD-10-CM | POA: Diagnosis not present

## 2021-06-15 DIAGNOSIS — R404 Transient alteration of awareness: Secondary | ICD-10-CM | POA: Diagnosis not present

## 2021-06-15 DIAGNOSIS — J1282 Pneumonia due to coronavirus disease 2019: Secondary | ICD-10-CM | POA: Diagnosis present

## 2021-06-15 DIAGNOSIS — R571 Hypovolemic shock: Secondary | ICD-10-CM | POA: Diagnosis not present

## 2021-06-15 DIAGNOSIS — I110 Hypertensive heart disease with heart failure: Secondary | ICD-10-CM | POA: Diagnosis present

## 2021-06-15 DIAGNOSIS — K7689 Other specified diseases of liver: Secondary | ICD-10-CM | POA: Diagnosis not present

## 2021-06-15 DIAGNOSIS — R509 Fever, unspecified: Secondary | ICD-10-CM

## 2021-06-15 DIAGNOSIS — G546 Phantom limb syndrome with pain: Secondary | ICD-10-CM | POA: Diagnosis not present

## 2021-06-15 DIAGNOSIS — G9009 Other idiopathic peripheral autonomic neuropathy: Secondary | ICD-10-CM | POA: Diagnosis not present

## 2021-06-15 DIAGNOSIS — Z823 Family history of stroke: Secondary | ICD-10-CM

## 2021-06-15 DIAGNOSIS — Z01818 Encounter for other preprocedural examination: Secondary | ICD-10-CM | POA: Diagnosis not present

## 2021-06-15 DIAGNOSIS — Z8249 Family history of ischemic heart disease and other diseases of the circulatory system: Secondary | ICD-10-CM

## 2021-06-15 DIAGNOSIS — D508 Other iron deficiency anemias: Secondary | ICD-10-CM | POA: Diagnosis not present

## 2021-06-15 DIAGNOSIS — R609 Edema, unspecified: Secondary | ICD-10-CM

## 2021-06-15 DIAGNOSIS — R8281 Pyuria: Secondary | ICD-10-CM | POA: Diagnosis present

## 2021-06-15 DIAGNOSIS — Y832 Surgical operation with anastomosis, bypass or graft as the cause of abnormal reaction of the patient, or of later complication, without mention of misadventure at the time of the procedure: Secondary | ICD-10-CM | POA: Diagnosis not present

## 2021-06-15 DIAGNOSIS — Z9889 Other specified postprocedural states: Secondary | ICD-10-CM | POA: Diagnosis not present

## 2021-06-15 DIAGNOSIS — R339 Retention of urine, unspecified: Secondary | ICD-10-CM | POA: Diagnosis not present

## 2021-06-15 DIAGNOSIS — I70231 Atherosclerosis of native arteries of right leg with ulceration of thigh: Secondary | ICD-10-CM | POA: Diagnosis not present

## 2021-06-15 DIAGNOSIS — R0602 Shortness of breath: Secondary | ICD-10-CM

## 2021-06-15 DIAGNOSIS — Z87440 Personal history of urinary (tract) infections: Secondary | ICD-10-CM

## 2021-06-15 DIAGNOSIS — K5900 Constipation, unspecified: Secondary | ICD-10-CM | POA: Diagnosis not present

## 2021-06-15 DIAGNOSIS — L97119 Non-pressure chronic ulcer of right thigh with unspecified severity: Secondary | ICD-10-CM | POA: Diagnosis not present

## 2021-06-15 DIAGNOSIS — Z8261 Family history of arthritis: Secondary | ICD-10-CM

## 2021-06-15 DIAGNOSIS — Z87820 Personal history of traumatic brain injury: Secondary | ICD-10-CM | POA: Diagnosis not present

## 2021-06-15 DIAGNOSIS — S81802S Unspecified open wound, left lower leg, sequela: Secondary | ICD-10-CM | POA: Diagnosis not present

## 2021-06-15 DIAGNOSIS — L97511 Non-pressure chronic ulcer of other part of right foot limited to breakdown of skin: Secondary | ICD-10-CM | POA: Diagnosis not present

## 2021-06-15 DIAGNOSIS — T82898A Other specified complication of vascular prosthetic devices, implants and grafts, initial encounter: Secondary | ICD-10-CM | POA: Diagnosis not present

## 2021-06-15 DIAGNOSIS — I70261 Atherosclerosis of native arteries of extremities with gangrene, right leg: Secondary | ICD-10-CM | POA: Diagnosis not present

## 2021-06-15 DIAGNOSIS — S99911A Unspecified injury of right ankle, initial encounter: Secondary | ICD-10-CM | POA: Diagnosis not present

## 2021-06-15 DIAGNOSIS — I517 Cardiomegaly: Secondary | ICD-10-CM | POA: Diagnosis not present

## 2021-06-15 DIAGNOSIS — E872 Acidosis: Secondary | ICD-10-CM | POA: Diagnosis present

## 2021-06-15 DIAGNOSIS — E46 Unspecified protein-calorie malnutrition: Secondary | ICD-10-CM | POA: Diagnosis not present

## 2021-06-15 DIAGNOSIS — Z66 Do not resuscitate: Secondary | ICD-10-CM | POA: Diagnosis not present

## 2021-06-15 DIAGNOSIS — Z743 Need for continuous supervision: Secondary | ICD-10-CM | POA: Diagnosis not present

## 2021-06-15 DIAGNOSIS — Z79899 Other long term (current) drug therapy: Secondary | ICD-10-CM

## 2021-06-15 DIAGNOSIS — R5381 Other malaise: Secondary | ICD-10-CM | POA: Diagnosis not present

## 2021-06-15 DIAGNOSIS — M6281 Muscle weakness (generalized): Secondary | ICD-10-CM | POA: Diagnosis not present

## 2021-06-15 DIAGNOSIS — I255 Ischemic cardiomyopathy: Secondary | ICD-10-CM | POA: Diagnosis present

## 2021-06-15 DIAGNOSIS — G319 Degenerative disease of nervous system, unspecified: Secondary | ICD-10-CM | POA: Diagnosis not present

## 2021-06-15 DIAGNOSIS — M25461 Effusion, right knee: Secondary | ICD-10-CM | POA: Diagnosis not present

## 2021-06-15 DIAGNOSIS — R410 Disorientation, unspecified: Secondary | ICD-10-CM | POA: Diagnosis not present

## 2021-06-15 DIAGNOSIS — G9340 Encephalopathy, unspecified: Secondary | ICD-10-CM | POA: Diagnosis not present

## 2021-06-15 DIAGNOSIS — L97519 Non-pressure chronic ulcer of other part of right foot with unspecified severity: Secondary | ICD-10-CM | POA: Diagnosis not present

## 2021-06-15 DIAGNOSIS — F17200 Nicotine dependence, unspecified, uncomplicated: Secondary | ICD-10-CM | POA: Diagnosis not present

## 2021-06-15 DIAGNOSIS — T827XXD Infection and inflammatory reaction due to other cardiac and vascular devices, implants and grafts, subsequent encounter: Secondary | ICD-10-CM | POA: Diagnosis not present

## 2021-06-15 DIAGNOSIS — Z4781 Encounter for orthopedic aftercare following surgical amputation: Secondary | ICD-10-CM | POA: Diagnosis not present

## 2021-06-15 DIAGNOSIS — R279 Unspecified lack of coordination: Secondary | ICD-10-CM | POA: Diagnosis not present

## 2021-06-15 DIAGNOSIS — M7989 Other specified soft tissue disorders: Secondary | ICD-10-CM | POA: Diagnosis not present

## 2021-06-15 DIAGNOSIS — R6889 Other general symptoms and signs: Secondary | ICD-10-CM | POA: Diagnosis not present

## 2021-06-15 DIAGNOSIS — F17210 Nicotine dependence, cigarettes, uncomplicated: Secondary | ICD-10-CM | POA: Diagnosis present

## 2021-06-15 DIAGNOSIS — R918 Other nonspecific abnormal finding of lung field: Secondary | ICD-10-CM | POA: Diagnosis not present

## 2021-06-15 LAB — URINALYSIS, ROUTINE W REFLEX MICROSCOPIC
Bilirubin Urine: NEGATIVE
Glucose, UA: NEGATIVE mg/dL
Ketones, ur: 5 mg/dL — AB
Nitrite: NEGATIVE
Protein, ur: 100 mg/dL — AB
Specific Gravity, Urine: 1.014 (ref 1.005–1.030)
WBC, UA: >50 WBC/hpf — ABNORMAL HIGH (ref 0–5)
pH: 5 (ref 5.0–8.0)

## 2021-06-15 LAB — CBC
HCT: 29.3 % — ABNORMAL LOW (ref 39.0–52.0)
Hemoglobin: 8.9 g/dL — ABNORMAL LOW (ref 13.0–17.0)
MCH: 22.5 pg — ABNORMAL LOW (ref 26.0–34.0)
MCHC: 30.4 g/dL (ref 30.0–36.0)
MCV: 74 fL — ABNORMAL LOW (ref 80.0–100.0)
Platelets: 479 10*3/uL — ABNORMAL HIGH (ref 150–400)
RBC: 3.96 MIL/uL — ABNORMAL LOW (ref 4.22–5.81)
RDW: 20.2 % — ABNORMAL HIGH (ref 11.5–15.5)
WBC: 21.1 10*3/uL — ABNORMAL HIGH (ref 4.0–10.5)
nRBC: 0 % (ref 0.0–0.2)

## 2021-06-15 LAB — BASIC METABOLIC PANEL
Anion gap: 13 (ref 5–15)
BUN: 32 mg/dL — ABNORMAL HIGH (ref 8–23)
CO2: 20 mmol/L — ABNORMAL LOW (ref 22–32)
Calcium: 9.1 mg/dL (ref 8.9–10.3)
Chloride: 101 mmol/L (ref 98–111)
Creatinine, Ser: 1.49 mg/dL — ABNORMAL HIGH (ref 0.61–1.24)
GFR, Estimated: 51 mL/min — ABNORMAL LOW (ref >60)
Glucose, Bld: 129 mg/dL — ABNORMAL HIGH (ref 70–99)
Potassium: 4.1 mmol/L (ref 3.5–5.1)
Sodium: 134 mmol/L — ABNORMAL LOW (ref 135–145)

## 2021-06-15 IMAGING — CT CT HEAD W/O CM
4 series · 15 of 47 positions shown, 17 images · non-contrast
Comparison: CT head [DATE]

CLINICAL DATA: Mental status change.  Unknown cause.

EXAM:
CT HEAD WITHOUT CONTRAST
TECHNIQUE: Contiguous axial images were obtained from the base of the skull
through the vertex without intravenous contrast.

[Series 3: head bone · axial · 0.44mm/px · z∈[-142,-124]mm · 2 of 86 slices shown]
[im 9/86  bone]
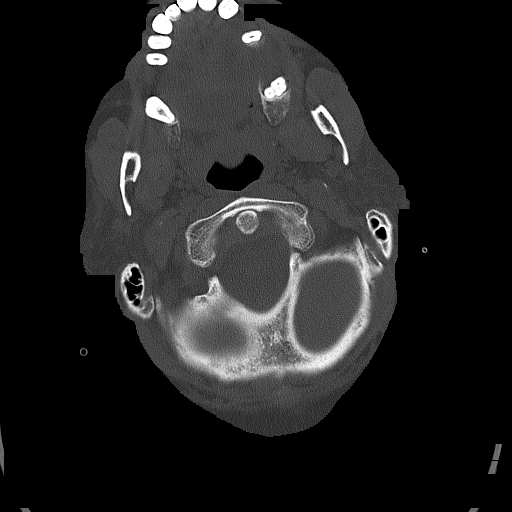
[im 18/86  bone]
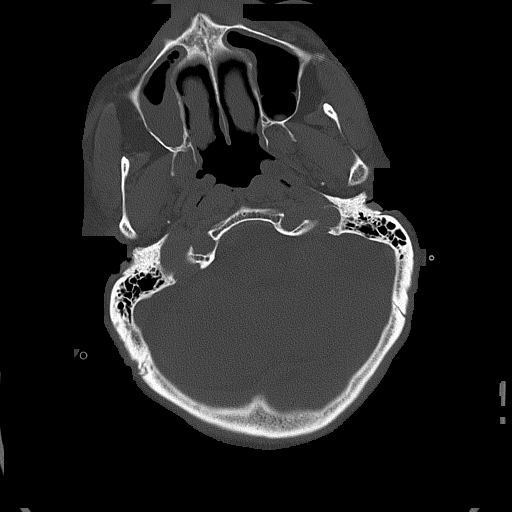

[Series 4: head without · axial · non-contrast · 0.44mm/px · z∈[-138,-14]mm · 7 of 35 slices shown, 9 images]
[im 5/35  brain]
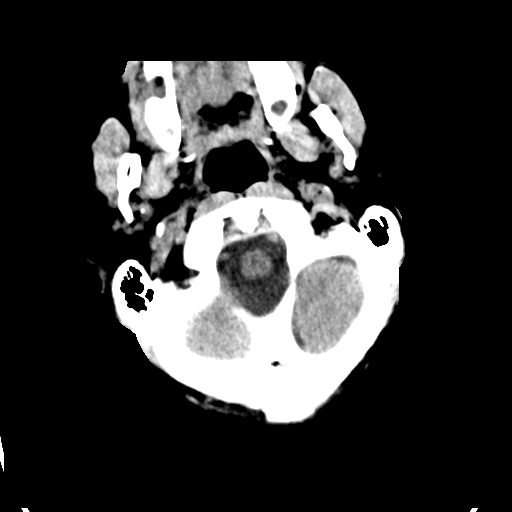
[im 5/35  bone]
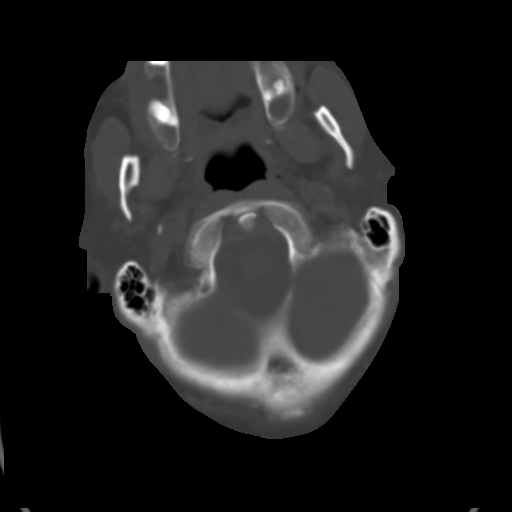
[im 9/35  brain]
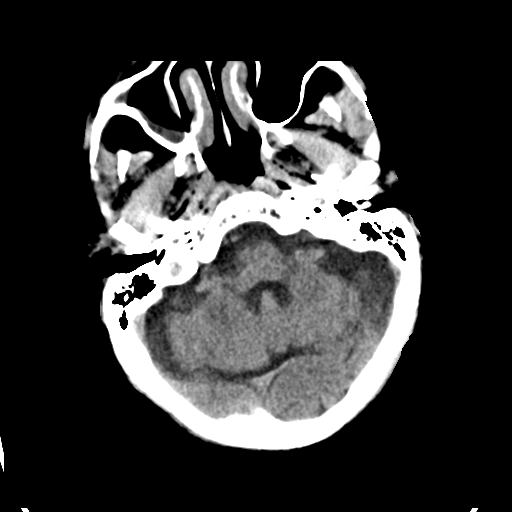
[im 13/35  brain]
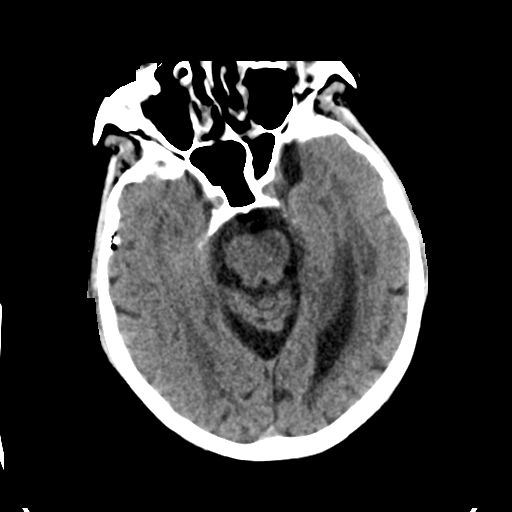
[im 18/35  brain]
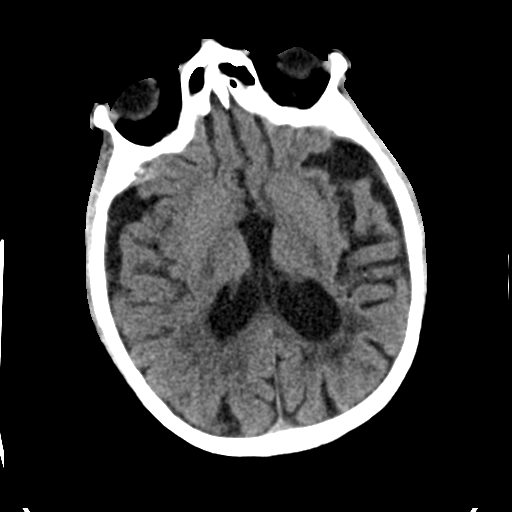
[im 22/35  brain]
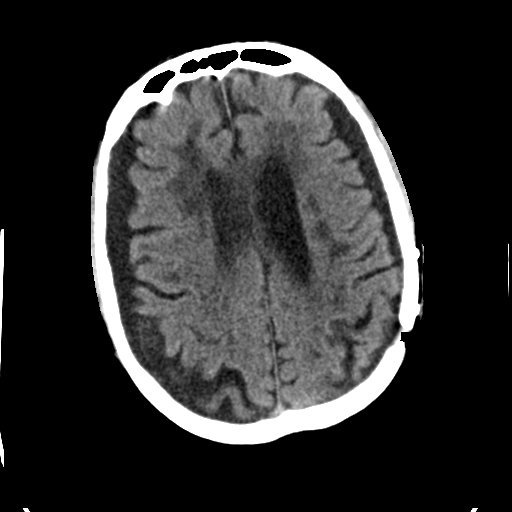
[im 22/35  bone]
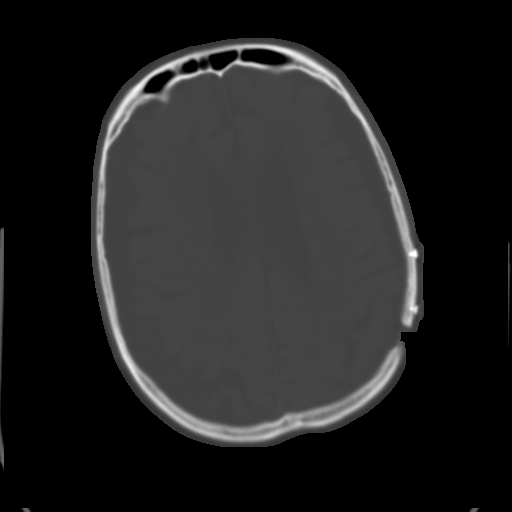
[im 26/35  brain]
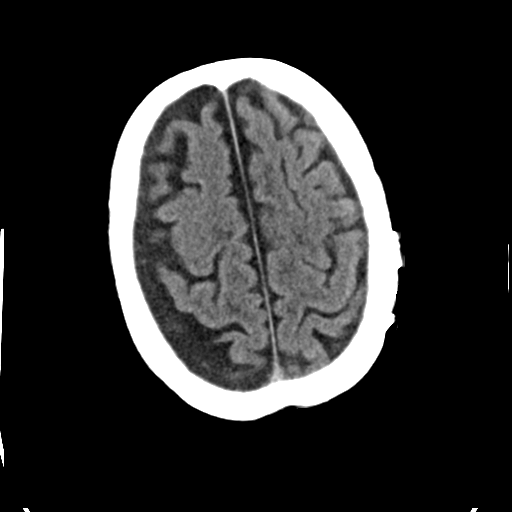
[im 30/35  brain]
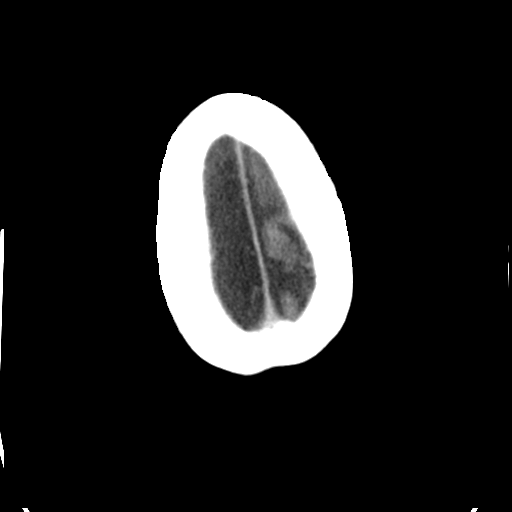

[Series 5: head without cor · coronal · non-contrast · 0.29mm/px · 3 of 74 slices shown]
[im 29/74  brain]
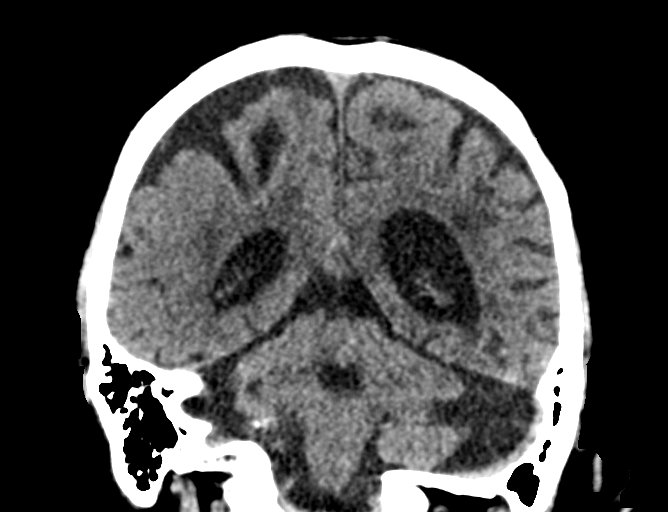
[im 34/74  brain]
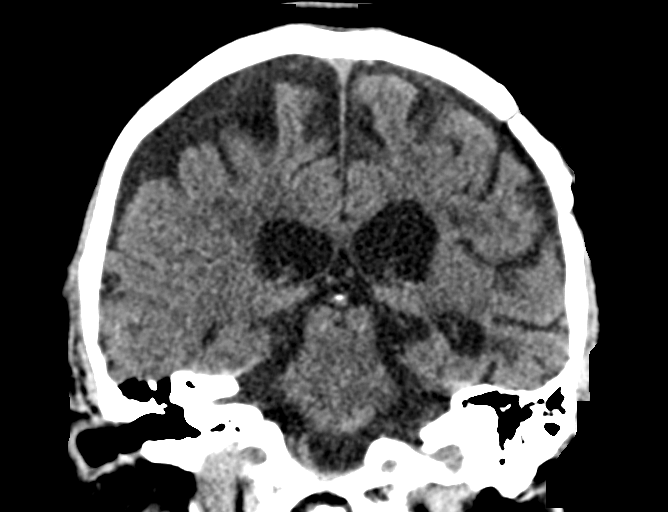
[im 40/74  brain]
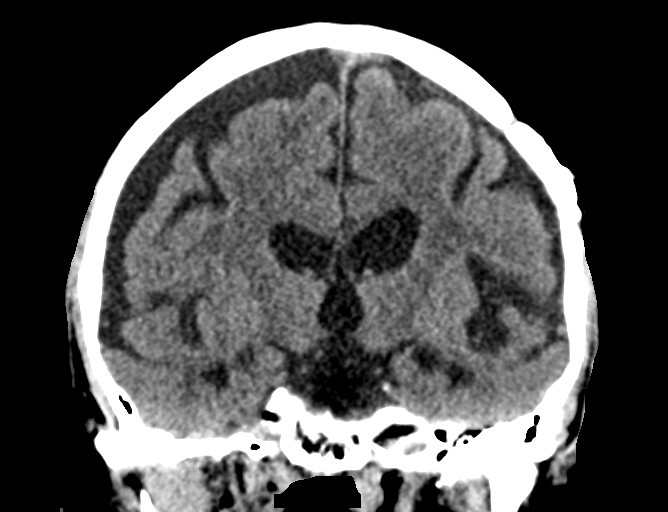

[Series 6: head without sag · sagittal · non-contrast · 0.31mm/px · 3 of 66 slices shown]
[im 22/66  brain]
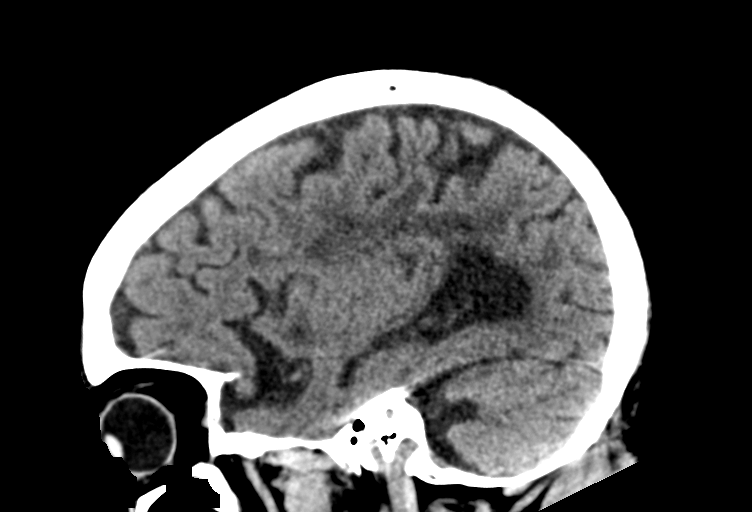
[im 33/66  brain]
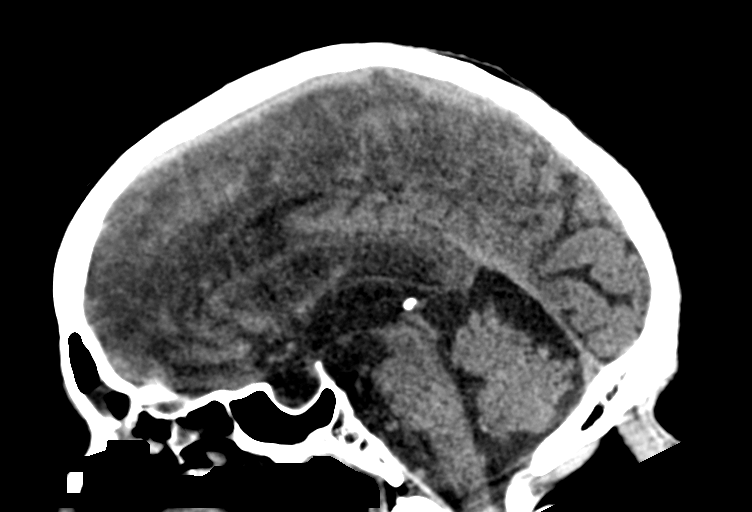
[im 44/66  brain]
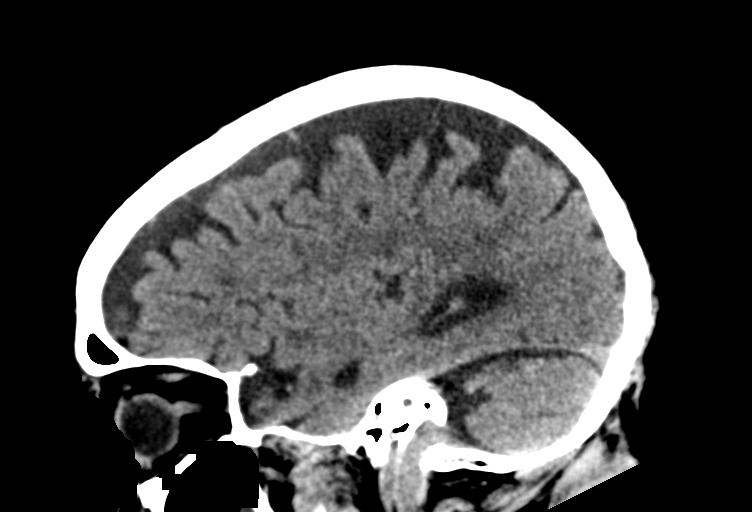

[15 of 47 positions shown; findings below may reference images not displayed]

FINDINGS: Brain:

Cerebral ventricle sizes are concordant with the degree of cerebral
volume loss. Patchy and confluent areas of decreased attenuation are
noted throughout the deep and periventricular white matter of the
cerebral hemispheres bilaterally, compatible with chronic
microvascular ischemic disease.

No evidence of large-territorial acute infarction. No parenchymal
hemorrhage. No mass lesion. No acute extra-axial collection.
Similar-appearing right subdural hygroma.

No mass effect or midline shift. No hydrocephalus. Basilar cisterns
are patent.

Redemonstration of a partial empty sella.

Vascular: No hyperdense vessel.

Skull: No acute fracture or focal lesion. Left parietal soft skull
surgical hardware.

Sinuses/Orbits: Mucosal thickening of all the paranasal sinuses. The
mastoid air cells are clear. The orbits are unremarkable.

Other: None.
IMPRESSION: 1. No acute intracranial abnormality.
2. Pansinusitis.

## 2021-06-15 IMAGING — DX DG ANKLE COMPLETE 3+V*R*
4 series · 4 of 4 positions shown · non-contrast
Comparison: None.

CLINICAL DATA: Infection/injury to foot.

EXAM:
RIGHT ANKLE - COMPLETE 3+ VIEW.  Patient unable to straighten foot.

[x ankle lat right]
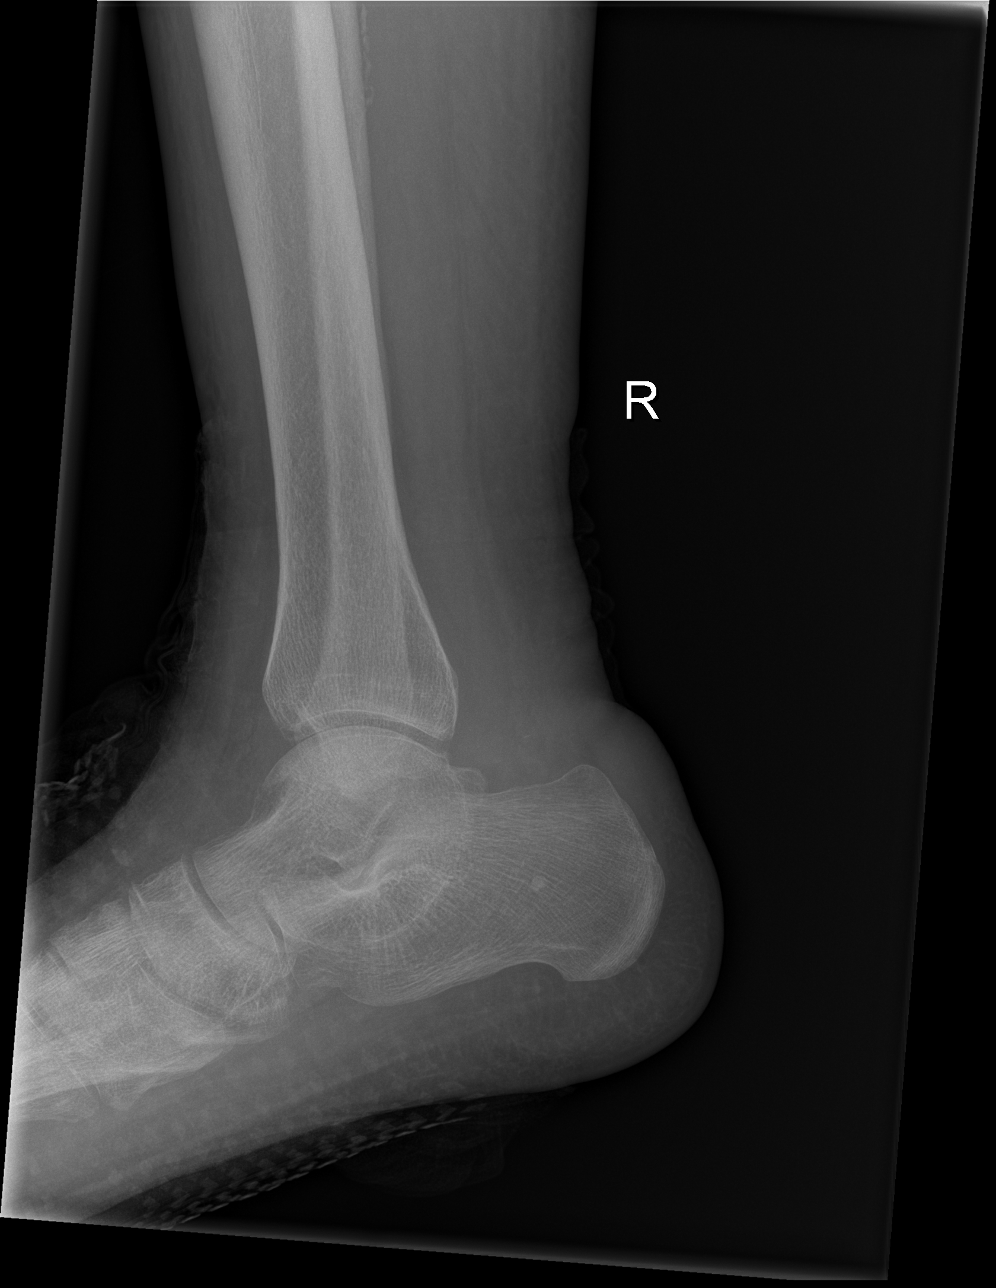

[x ankle ap right]
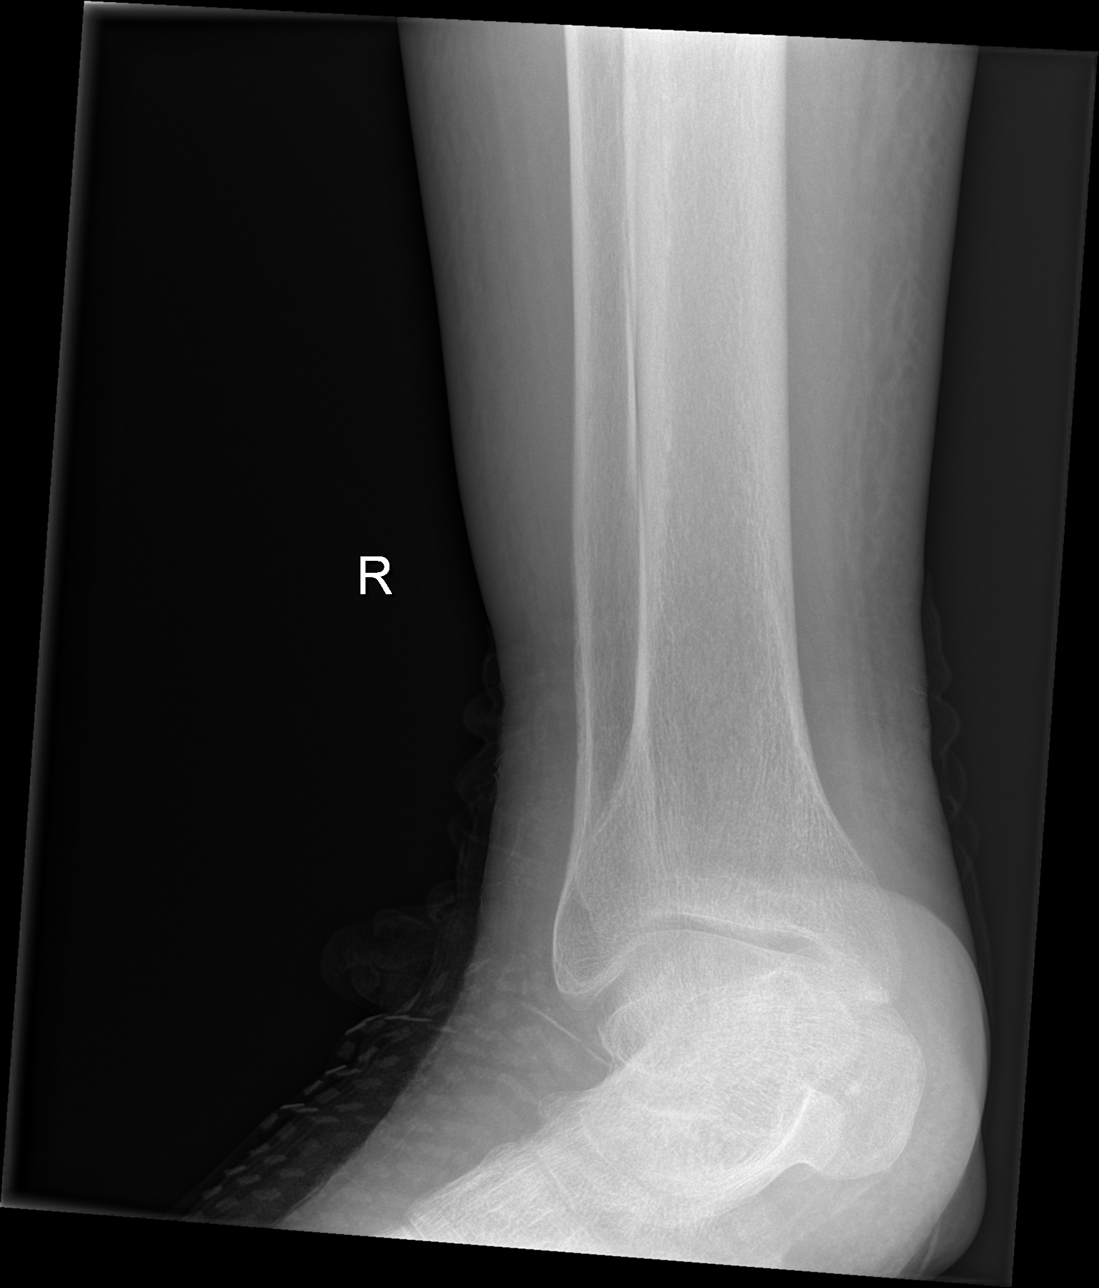

[x ankle obl right (1 of 2)]
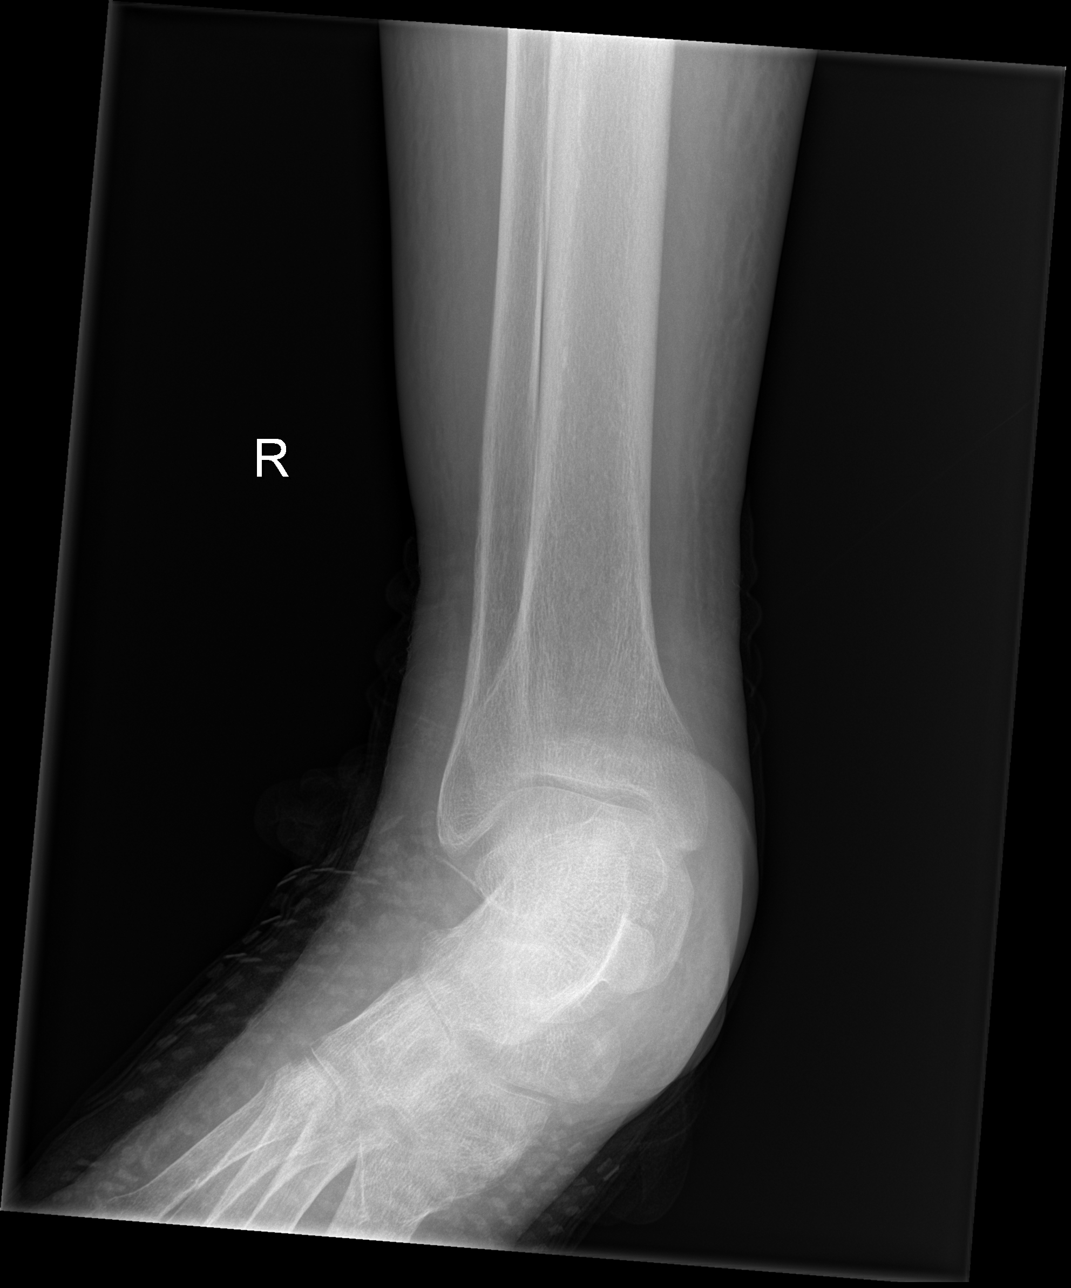

[x ankle obl right (2 of 2)]
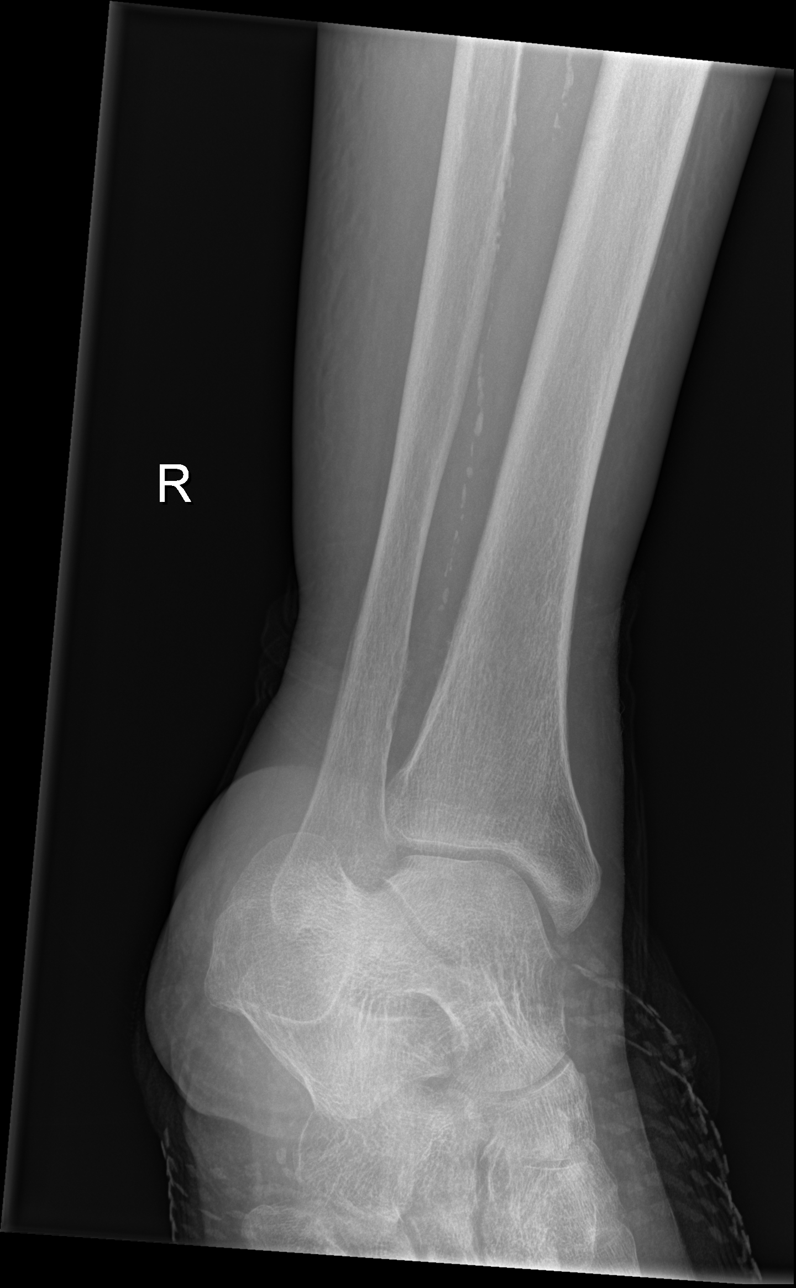

[4 of 4 positions shown; findings below may reference images not displayed]

FINDINGS: No cortical erosion or destruction. There is no evidence of
fracture, dislocation, or joint effusion. There is no evidence of
arthropathy or other focal bone abnormality. Soft tissues are
unremarkable.
IMPRESSION: 1. No radiographic evidence of osteomyelitis.
2. No acute displaced fracture or dislocation.
3. Limited evaluation of the right ankle due to positioning.

## 2021-06-15 MED ORDER — ENOXAPARIN SODIUM 40 MG/0.4ML IJ SOSY
40.0000 mg | PREFILLED_SYRINGE | INTRAMUSCULAR | Status: DC
  Administered 2021-06-15 – 2021-06-28 (×13): 40 mg via SUBCUTANEOUS
  Filled 2021-06-15 (×13): qty 0.4

## 2021-06-15 MED ORDER — MELATONIN 3 MG PO TABS: 3.0000 mg | ORAL_TABLET | Freq: Every evening | ORAL | Status: DC | PRN

## 2021-06-15 MED ORDER — CITALOPRAM HYDROBROMIDE 40 MG PO TABS
40.0000 mg | ORAL_TABLET | Freq: Every day | ORAL | Status: DC
  Administered 2021-06-16 – 2021-06-17 (×2): 40 mg via ORAL
  Filled 2021-06-15 (×2): qty 1

## 2021-06-15 MED ORDER — LINEZOLID 600 MG/300ML IV SOLN
600.0000 mg | Freq: Two times a day (BID) | INTRAVENOUS | Status: DC
  Administered 2021-06-16: 600 mg via INTRAVENOUS
  Filled 2021-06-15 (×2): qty 300

## 2021-06-15 MED ORDER — ACETAMINOPHEN 325 MG PO TABS
650.0000 mg | ORAL_TABLET | Freq: Four times a day (QID) | ORAL | Status: DC | PRN
  Administered 2021-06-19: 650 mg via ORAL
  Filled 2021-06-15: qty 2

## 2021-06-15 MED ORDER — SODIUM CHLORIDE 0.9% FLUSH: 3.0000 mL | Freq: Once | INTRAVENOUS | Status: DC

## 2021-06-15 MED ORDER — CLONAZEPAM 0.25 MG PO TBDP
0.2500 mg | ORAL_TABLET | Freq: Two times a day (BID) | ORAL | Status: DC | PRN
  Administered 2021-06-29 – 2021-07-08 (×7): 0.25 mg via ORAL
  Filled 2021-06-15 (×7): qty 1

## 2021-06-15 MED ORDER — LACTATED RINGERS IV SOLN: INTRAVENOUS | Status: DC

## 2021-06-15 MED ORDER — ASPIRIN EC 81 MG PO TBEC
81.0000 mg | DELAYED_RELEASE_TABLET | Freq: Every day | ORAL | Status: DC
  Administered 2021-06-16 – 2021-07-12 (×27): 81 mg via ORAL
  Filled 2021-06-15 (×27): qty 1

## 2021-06-15 MED ORDER — QUETIAPINE FUMARATE 200 MG PO TABS
200.0000 mg | ORAL_TABLET | Freq: Two times a day (BID) | ORAL | Status: DC
  Administered 2021-06-16 – 2021-06-24 (×17): 200 mg via ORAL
  Filled 2021-06-15: qty 1
  Filled 2021-06-15 (×11): qty 2
  Filled 2021-06-15: qty 4
  Filled 2021-06-15 (×4): qty 2
  Filled 2021-06-15: qty 1
  Filled 2021-06-15: qty 2

## 2021-06-15 MED ORDER — NICOTINE 7 MG/24HR TD PT24
7.0000 mg | MEDICATED_PATCH | Freq: Every day | TRANSDERMAL | Status: DC
  Administered 2021-06-16 – 2021-07-09 (×24): 7 mg via TRANSDERMAL
  Filled 2021-06-15 (×27): qty 1

## 2021-06-15 MED ORDER — POLYETHYLENE GLYCOL 3350 17 G PO PACK: 17.0000 g | PACK | Freq: Every day | ORAL | Status: DC | PRN

## 2021-06-15 MED ORDER — TAMSULOSIN HCL 0.4 MG PO CAPS
0.8000 mg | ORAL_CAPSULE | Freq: Every evening | ORAL | Status: DC
  Administered 2021-06-16 – 2021-07-12 (×25): 0.8 mg via ORAL
  Filled 2021-06-15 (×26): qty 2

## 2021-06-15 MED ORDER — PANTOPRAZOLE SODIUM 40 MG PO TBEC
40.0000 mg | DELAYED_RELEASE_TABLET | Freq: Every evening | ORAL | Status: DC
  Administered 2021-06-16 – 2021-07-12 (×26): 40 mg via ORAL
  Filled 2021-06-15 (×26): qty 1

## 2021-06-15 MED ORDER — METOPROLOL TARTRATE 25 MG PO TABS: 25.0000 mg | ORAL_TABLET | Freq: Two times a day (BID) | ORAL | Status: DC

## 2021-06-15 MED ORDER — SODIUM CHLORIDE 0.9 % IV SOLN
2.0000 g | Freq: Once | INTRAVENOUS | Status: AC
  Administered 2021-06-15: 2 g via INTRAVENOUS
  Filled 2021-06-15: qty 20

## 2021-06-15 MED ORDER — SODIUM CHLORIDE 0.9 % IV SOLN
2.0000 g | INTRAVENOUS | Status: DC
  Administered 2021-06-16: 2 g via INTRAVENOUS
  Filled 2021-06-15: qty 20

## 2021-06-15 MED ORDER — PROCHLORPERAZINE EDISYLATE 10 MG/2ML IJ SOLN
10.0000 mg | Freq: Four times a day (QID) | INTRAMUSCULAR | Status: DC | PRN
  Filled 2021-06-15: qty 2

## 2021-06-15 MED ORDER — GABAPENTIN 100 MG PO CAPS
100.0000 mg | ORAL_CAPSULE | Freq: Two times a day (BID) | ORAL | Status: DC
  Administered 2021-06-15 – 2021-07-03 (×36): 100 mg via ORAL
  Filled 2021-06-15 (×36): qty 1

## 2021-06-15 MED ORDER — ATORVASTATIN CALCIUM 80 MG PO TABS
80.0000 mg | ORAL_TABLET | Freq: Every evening | ORAL | Status: DC
  Administered 2021-06-16 – 2021-06-17 (×2): 80 mg via ORAL
  Filled 2021-06-15 (×2): qty 1

## 2021-06-15 MED ORDER — METOPROLOL TARTRATE 12.5 MG HALF TABLET
12.5000 mg | ORAL_TABLET | Freq: Two times a day (BID) | ORAL | Status: DC
  Administered 2021-06-15 – 2021-06-27 (×24): 12.5 mg via ORAL
  Filled 2021-06-15 (×24): qty 1

## 2021-06-15 NOTE — ED Triage Notes (Addendum)
Pt to triage via [facility] from home.  Pt discharged from rehab yesterday.  Family reports intermittent confusion, generalized weakness, and fatigue.  Hx of frequent UTIs.  CBG 148.  GCS 14.  Pt normally ambulatory with a walker but is not able to coordinate to ambulate today.  Pt reports lower back pain.

## 2021-06-15 NOTE — ED Notes (Signed)
Patient transported to X-ray 

## 2021-06-15 NOTE — ED Notes (Signed)
This RN attempted blood draw for [facility]. Patient was very reluctant to have anyone attempt stick, but finally agreed. Attempt was unsuccessful and patient now refuses additional sticks by anyone.

## 2021-06-15 NOTE — ED Provider Notes (Addendum)
Emergency Medicine Provider Triage Evaluation Note  Lonnie [name] , a 70 y.o. male  was evaluated in triage.  Pt complains of AMS.  EMS reports patient is intermittent confusion that began today, concerns for UTI due to appearance of urine by EMS.  Just discharged yesterday from inpatient rehab.  Review of Systems  Positive: confusion Negative: Fever  Physical Exam  BP (!) 153/71   Pulse 83   Temp 99.1 F (37.3 C)   Resp 20   SpO2 96%  Gen:   Awake,  Resp:  Normal effort  MSK:   Right lower extremity with edema purulent wound to the distal lower leg Other:  States the month is June or July, Sunday, 2020  Medical Decision Making  Medically screening exam initiated at 6:43 PM.  Appropriate orders placed.  Demarkus Remmel Adcock was informed that the remainder of the evaluation will be completed by another provider, this initial triage assessment does not replace that evaluation, and the importance of remaining in the ED until their evaluation is complete.     Petrea Fredenburg, [place] N, PA-C 06/15/21 1845    Raeonna Milo, [place] N, PA-C 06/15/21 2011    Arby Barrette, MD 06/20/21 [phone]

## 2021-06-15 NOTE — H&P (Addendum)
History and Physical  JABEZ MOLNER ZOX:[id number] DOB: [birth year] DOA: 06/15/2021  Referring physician: Dr. Clarice Pole, EDP. PCP: [name], MD  Outpatient Specialists: Cardiology, cardiovascular surgery, podiatry. Patient coming from: Home, lives with his daughter who is a Engineer, civil (consulting).    Chief Complaint: Altered mental status.  HPI: Lonnie [name] is a 70 y.o. male with medical history significant for dementia, peripheral artery disease, tobacco use disorder, chronic depression/anxiety, iron deficiency anemia, peripheral neuropathy, essential hypertension, hyperlipidemia, GERD, BPH, who presented to [facility] ED from home due to confusion of 1 day duration.  Associated with urinary retention and generalized weakness.  Patient was recently discharged from the hospital on 06/07/2021 after vascular surgery procedure, was sent to rehab and returned home from rehab yesterday 06/14/2021.  All day yesterday he was confused and was making "orange appearing urine" per his daughter.  Was brought in today by his daughter who is a Engineer, civil (consulting) for further evaluation and management.  Work-up in the ED revealed acute urinary retention more than 900 cc from bladder scan, AKI, and possible urinary tract infection, urine analysis is pending at the time of this dictation.  We was started on IV antibiotics empirically by EDP.  TRH, hospitalist team, was asked to admit.  ED Course:  Temperature 99.1.  BP 140/73, pulse 59, respiration rate 15.  O2 saturation 99% on room air.  WBC 21.1, hemoglobin 8.9, MCV 74, platelet 479.  Serum sodium 134, serum bicarb 20, glucose 129, BUN 32, creatinine 1.49 with baseline of 0.91.  GFR 51.  Review of Systems: Review of systems as noted in the HPI. All other systems reviewed and are negative.   Past Medical History:  Diagnosis Date   Allergy    Anxiety    Arthritis    left neck, shoulder, knee   CAD (coronary artery disease)    a. anterior STEMI 10/2013 s/p 4V CABG with LIMA to mid LAD,  SVG to OM, SVG to PDA, SVG to Diagonal.   Chronic combined systolic and diastolic CHF (congestive heart failure) (HCC)    Dementia (HCC)    Depression    Falls    Hypertension    Ischemic cardiomyopathy    a. EF 40-45% at time of CABG and in 2018.   MI (myocardial infarction) (HCC)    Peripheral neuropathy    Prediabetes 09/01/2014   PVD (peripheral vascular disease) (HCC)    a. s/p L SFA stents with now known bilateral SFA. b. right femoral to below the knee bypass by Dr. Myra Gianotti in 12/2019, c/b infection 02/2020.   Stroke [facility])    seen on CT Scan   Subdural hematoma (HCC)    Tobacco abuse    UTI (urinary tract infection)    Past Surgical History:  Procedure Laterality Date   ABDOMINAL AORTAGRAM N/A 06/07/2014   Procedure: ABDOMINAL Ronny Flurry;  Surgeon: Iran Ouch, MD;  Location: [facility] CATH LAB;  Service: Cardiovascular;  Laterality: N/A;   ABDOMINAL AORTAGRAM N/A 03/07/2015   Procedure: ABDOMINAL Ronny Flurry;  Surgeon: Iran Ouch, MD;  Location: [facility] CATH LAB;  Service: Cardiovascular;  Laterality: N/A;   ABDOMINAL AORTOGRAM W/LOWER EXTREMITY N/A 01/18/2020   Procedure: ABDOMINAL AORTOGRAM W/LOWER EXTREMITY - Right;  Surgeon: Iran Ouch, MD;  Location: [facility] INVASIVE CV LAB;  Service: Cardiovascular;  Laterality: N/A;   ANGIOPLASTY Right 06/08/2021   Procedure: BALLOON ANGIOPLASTY;  Surgeon: Nada Libman, MD;  Location: [facility] OR;  Service: Vascular;  Laterality: Right;   APPENDECTOMY  COLONOSCOPY WITH PROPOFOL N/A 10/11/2015   Procedure: COLONOSCOPY WITH PROPOFOL;  Surgeon: Charna Elizabeth, MD;  Location: [facility] ENDOSCOPY;  Service: Endoscopy;  Laterality: N/A;   CORONARY ARTERY BYPASS GRAFT N/A 11/04/2013   Procedure: CORONARY ARTERY BYPASS GRAFTING (CABG) TIMES FOUR  USING LEFT INTERNAL MAMMARY ARTERY AND RIGHT AND LEFT SAPHENOUS LEG VEIN HARVESTED ENDOSCOPICALLY;  Surgeon: Loreli Slot, MD;  Location: [facility] OR;  Service: Open Heart Surgery;  Laterality: N/A;   CRANIOTOMY  Left 06/30/2019   Procedure: Frontal CRANIOTOMY HEMATOMA EVACUATION SUBDURAL;  Surgeon: Lisbeth Renshaw, MD;  Location: [facility] OR;  Service: Neurosurgery;  Laterality: Left;  Frontal CRANIOTOMY HEMATOMA EVACUATION SUBDURAL   FEMORAL-POPLITEAL BYPASS GRAFT Right 01/20/2020   Procedure: BYPASS GRAFT FEMORAL-POPLITEAL ARTERY RIGHT LEG USING GORE PROPATEN GRAFT;  Surgeon: Nada Libman, MD;  Location: [facility] OR;  Service: Vascular;  Laterality: Right;   FINGER SURGERY  2017   injury   I & D EXTREMITY Right 03/02/2020   Procedure: IRRIGATION AND DEBRIDEMENT EXTREMITY right  lower leg  with Antibiotic beads.;  Surgeon: Chuck Hint, MD;  Location: [facility] OR;  Service: Vascular;  Laterality: Right;   INSERTION OF ILIAC STENT Right 06/08/2021   Procedure: INSERTION OF RIGHT UPPER LEG STENT;  Surgeon: Nada Libman, MD;  Location: [facility] OR;  Service: Vascular;  Laterality: Right;   KNEE SURGERY     fractured patella   LEFT HEART CATH N/A 10/29/2013   Procedure: LEFT HEART CATH;  Surgeon: Kathleene Hazel, MD;  Location: [facility] C[facility] LAB;  Service: Cardiovascular;  Laterality: N/A;   LEFT HEART CATHETERIZATION WITH CORONARY ANGIOGRAM N/A 10/31/2013   Procedure: LEFT HEART CATHETERIZATION WITH CORONARY ANGIOGRAM;  Surgeon: Kathleene Hazel, MD;  Location: [facility] CATH LAB;  Service: Cardiovascular;  Laterality: N/A;   LOWER EXTREMITY ANGIOGRAPHY Right 06/07/2021   Procedure: Lower Extremity Angiography;  Surgeon: Sherren Kerns, MD;  Location: [facility] INVASIVE CV LAB;  Service: Cardiovascular;  Laterality: Right;  W/ ABD    MOUTH SURGERY     PERIPHERAL VASCULAR THROMBECTOMY Right 06/07/2021   Procedure: PERIPHERAL VASCULAR THROMBECTOMY;  Surgeon: Sherren Kerns, MD;  Location: [facility] INVASIVE CV LAB;  Service: Cardiovascular;  Laterality: Right;   TOE SURGERY      Social History:  reports that he has been smoking cigarettes. He has a 7.50 pack-year smoking history. He has never used smokeless tobacco. He  reports that he does not drink alcohol and does not use drugs.   Allergies  Allergen Reactions   Ace Inhibitors Swelling and Other (See Comments)    Angioedema   Eggs Or Egg-Derived Products Nausea Only and Other (See Comments)    Cannot eat prepared eggs- Occasional nausea   Lactose Intolerance (Gi) Diarrhea, Nausea Only and Other (See Comments)    Flatulence, also   Latex Itching    Family History  Problem Relation Age of Onset   CAD Father 37   AAA (abdominal aortic aneurysm) Father    Alcohol abuse Father    Hypertension Father    Diabetes Father    Heart attack Father    Stroke Mother    Arthritis Mother    Heart disease Mother    Hypertension Mother    Heart attack Mother    Cardiomyopathy Daughter    Colon cancer Neg Hx    Prostate cancer Neg Hx       Prior to Admission medications   Medication Sig Start Date End Date Taking? Authorizing Provider  acetaminophen (TYLENOL) 500  MG tablet Take 1,000 mg by mouth in the morning and at bedtime.   Yes [provider]  aspirin EC 81 MG tablet Take 81 mg by mouth daily.   Yes [provider]  atorvastatin (LIPITOR) 80 MG tablet Take 1 tablet (80 mg total) by mouth daily. Patient taking differently: Take 80 mg by mouth every evening. 05/08/21  Yes Paz, Nolon Rod, MD  Calcium Carb-Cholecalciferol (CALCIUM 600+D3 PO) Take 1 tablet by mouth daily with breakfast.   Yes [provider]  citalopram (CELEXA) 20 MG tablet Take 2 tablets (40 mg total) by mouth daily. Patient taking differently: Take 20-40 mg by mouth See admin instructions. Take 40 mg by mouth in the morning and 20 mg in the evening 05/15/21  Yes Paz, Elita Quick E, MD  clonazePAM (KLONOPIN) 0.25 MG disintegrating tablet DISSOLVE 1 TABLET ON THE  TONGUE TWICE DAILY AS  NEEDED Patient taking differently: Take 0.25 mg by mouth in the morning and at bedtime. 05/14/21  Yes Paz, Nolon Rod, MD  clopidogrel (PLAVIX) 75 MG tablet Take 1 tablet (75 mg total) by mouth  daily. Patient taking differently: Take 75 mg by mouth every evening. 02/05/21  Yes Paz, Nolon Rod, MD  docusate sodium (COLACE) 100 MG capsule Take 100 mg by mouth daily.   Yes [provider]  furosemide (LASIX) 20 MG tablet Take 1 tablet (20 mg total) by mouth daily as needed (ankle swelling). Patient taking differently: Take 20 mg by mouth daily. 03/13/21  Yes Paz, Nolon Rod, MD  gabapentin (NEURONTIN) 100 MG capsule Take 1 capsule (100 mg total) by mouth 2 (two) times daily. 05/08/21  Yes Paz, Nolon Rod, MD  irbesartan (AVAPRO) 300 MG tablet Take 1 tablet (300 mg total) by mouth daily. 05/08/21  Yes Paz, Nolon Rod, MD  melatonin 5 MG TABS Take 10 mg by mouth every evening.   Yes [provider]  metoprolol tartrate (LOPRESSOR) 25 MG tablet Take 1 tablet (25 mg total) by mouth 2 (two) times daily. Patient taking differently: Take 25 mg by mouth in the morning and at bedtime. 03/11/21  Yes Paz, Nolon Rod, MD  Multiple Vitamin (MULTIVITAMIN WITH MINERALS) TABS tablet Take 1 tablet by mouth daily.   Yes [provider]  pantoprazole (PROTONIX) 40 MG tablet Take 1 tablet (40 mg total) by mouth at bedtime. Patient taking differently: Take 40 mg by mouth every evening. 03/11/21  Yes Paz, Nolon Rod, MD  QUEtiapine (SEROQUEL) 200 MG tablet Take 1 tablet (200 mg total) by mouth 2 (two) times daily. Patient taking differently: Take 200 mg by mouth in the morning and at bedtime. 05/08/21  Yes Paz, Nolon Rod, MD  tamsulosin (FLOMAX) 0.4 MG CAPS capsule Take 2 capsules (0.8 mg total) by mouth daily after supper. Patient taking differently: Take 0.8 mg by mouth every evening. 11/08/20  Yes Paz, Nolon Rod, MD  ferrous fumarate (HEMOCYTE - 106 MG FE) 325 (106 Fe) MG TABS tablet Take 1 tablet by mouth before lunch and before dinner Patient not taking: No sig reported 06/03/21   [name], MD  Liniments (BEN GAY EX) Apply 1 application topically daily as needed (typically right knee).    [provider]   nicotine (NICODERM CQ - DOSED IN MG/24 HR) 7 mg/24hr patch Place 1 patch (7 mg total) onto the skin daily. 09/12/20   Pokhrel, Rebekah Chesterfield, MD  oxyCODONE (ROXICODONE) 5 MG immediate release tablet Take 1 tablet (5 mg total) by mouth every 6 (  six) hours as needed. 06/14/21   Dara Lords, PA-C    Physical Exam: BP 140/73   Pulse (!) 59   Temp 99.1 F (37.3 C)   Resp 15   SpO2 99%   General: 70 y.o. year-old male well developed well nourished in no acute distress.  Alert and interactive. Cardiovascular: Regular rate and rhythm with no rubs or gallops.  No thyromegaly or JVD noted.  No lower extremity edema. 2/4 pulses in all 4 extremities. Respiratory: Clear to auscultation with no wheezes or rales. Good inspiratory effort. Abdomen: Soft nontender nondistended with normal bowel sounds x4 quadrants. Muskuloskeletal: No cyanosis, clubbing or edema noted bilaterally Neuro: CN II-XII intact, strength, sensation, reflexes Skin: R foot wound, dorsal region. Psychiatry: Judgement and insight appear altered. Mood is appropriate for condition and setting.          Labs on Admission:  Basic Metabolic Panel: Recent Labs  Lab 06/10/21 0249 06/15/21 1809  NA 135 134*  K 3.6 4.1  CL 103 101  CO2 21* 20*  GLUCOSE 98 129*  BUN 18 32*  CREATININE 0.91 1.49*  CALCIUM 8.5* 9.1   Liver Function Tests: No results for input(s): AST, ALT, ALKPHOS, BILITOT, PROT, ALBUMIN in the last 168 hours. No results for input(s): LIPASE, AMYLASE in the last 168 hours. No results for input(s): AMMONIA in the last 168 hours. CBC: Recent Labs  Lab 06/09/21 1055 06/09/21 1828 06/10/21 0249 06/15/21 1809  WBC 12.1*  --  18.7* 21.1*  HGB 5.8* 8.7* 8.6* 8.9*  HCT 19.7* 28.1* 27.6* 29.3*  MCV 71.4*  --  73.0* 74.0*  PLT 253  --  269 479*   Cardiac Enzymes: No results for input(s): CKTOTAL, CKMB, CKMBINDEX, TROPONINI in the last 168 hours.  BNP (last 3 results) No results for input(s): BNP in the last  8760 hours.  ProBNP (last 3 results) No results for input(s): PROBNP in the last 8760 hours.  CBG: No results for input(s): GLUCAP in the last 168 hours.  Radiological Exams on Admission: DG Ankle Complete Right  Result Date: 06/15/2021 CLINICAL DATA:  Infection/injury to foot. EXAM: RIGHT ANKLE - COMPLETE 3+ VIEW.  Patient unable to straighten foot. COMPARISON:  None. FINDINGS: No cortical erosion or destruction. There is no evidence of fracture, dislocation, or joint effusion. There is no evidence of arthropathy or other focal bone abnormality. Soft tissues are unremarkable. IMPRESSION: 1. No radiographic evidence of osteomyelitis. 2. No acute displaced fracture or dislocation. 3. Limited evaluation of the right ankle due to positioning. Electronically Signed   By: Tish Frederickson M.D.   On: 06/15/2021 19:42   CT Head Wo Contrast  Result Date: 06/15/2021 CLINICAL DATA:  Mental status change.  Unknown cause. EXAM: CT HEAD WITHOUT CONTRAST TECHNIQUE: Contiguous axial images were obtained from the base of the skull through the vertex without intravenous contrast. COMPARISON:  CT head 09/09/2020 FINDINGS: Brain: Cerebral ventricle sizes are concordant with the degree of cerebral volume loss. Patchy and confluent areas of decreased attenuation are noted throughout the deep and periventricular white matter of the cerebral hemispheres bilaterally, compatible with chronic microvascular ischemic disease. No evidence of large-territorial acute infarction. No parenchymal hemorrhage. No mass lesion. No acute extra-axial collection. Similar-appearing right subdural hygroma. No mass effect or midline shift. No hydrocephalus. Basilar cisterns are patent. Redemonstration of a partial empty sella. Vascular: No hyperdense vessel. Skull: No acute fracture or focal lesion. Left parietal soft skull surgical hardware. Sinuses/Orbits: Mucosal thickening of all the paranasal  sinuses. The mastoid air cells are clear. The  orbits are unremarkable. Other: None. IMPRESSION: 1. No acute intracranial abnormality. 2. Pansinusitis. Electronically Signed   By: Tish Frederickson M.D.   On: 06/15/2021 19:45    EKG: I independently viewed the EKG done and my findings are as followed: Sinus rhythm rate of 79.  Occasional PVCs, nonspecific ST-T changes.  QTc 495.  Assessment/Plan Present on Admission:  AKI (acute kidney injury) (HCC)  Active Problems:   AKI (acute kidney injury) (HCC)  AKI, suspect multifactorial, prerenal from dehydration, postrenal from acute urinary retention in the setting of BPH Baseline creatinine appears to be 0.91 with GFR greater than 60. Presented with creatinine of 1.49 with acute urinary retention with greater than 900 cc on bladder scan. Place Foley catheter in the ED Give gentle IV fluid hydration and closely monitor volume status while on IV fluid LR 30 cc/h x 1 day. Monitor urine output Avoid nephrotoxic agents, hypotension and dehydration. Repeat BMP in the morning  Leukocytosis, rule rule out active infective process Follow UA and urine culture Follow blood cultures Right foot wound, POA He was started on Rocephin 2 g daily for presumptive UTI in the ED, continue for now. Added IV Linezolid due to concern for R foot wound infection (MRSA screen + on 06/07/21)  Peripheral vascular disease status post right peripheral vascular thrombectomy and right lower extremity angiography on 06/07/2021 by vascular surgery, Dr. Darrick Penna Please consult vascular surgery in the morning to reassess right foot wound. Patient would benefit from complete tobacco cessation Continue home aspirin, Plavix, and Lipitor  Mild anion gap metabolic acidosis secondary to acute kidney injury Presented with serum bicarb of 20 and anion gap of 13 Start gentle IV fluid hydration lactated Ringer and repeat BMP in the morning  Acute metabolic encephalopathy suspect multifactorial, secondary to dehydration, possible  UTI, right foot wound, worsening dementia Reorient as needed Treat underlying conditions Fall/aspiration/delirium precautions  Acute urinary retention in the setting of BPH Resume home BPH med, Flomax. Foley catheter placed in the ED on 06/15/2021 due to greater than 900 cc on bladder scan. Keep Foley catheter in for now, initiate voiding trial once able to ambulate If fails voiding trial while in the hospital will need to replace Foley catheter and follow-up with urology outpatient after 1 week.  Presumptive UTI Per daughter's report who is a nurse patient had thick orange urine UA is pending Started on Rocephin in the ED, continue for now until UTI is ruled out  R foot wound, POA Positive MRSA screen on 06/07/21 Wound care specialist consult Local wound care Please consult Vascular surgery in the morning to reassess wound from recent vascular surgery If no improvement to the wound please consult ID for follow up.  HFrEF 40 to 45% Ischemic cardiomyopathy Peripheral vascular disease Coronary artery disease History of MI Last 2D echo done on 05/12/2017 revealed LVEF 40 to 45% Close monitoring of volume status while on IV fluid. Resume home Lopressor, aspirin, Plavix, Lipitor Hold off home Lasix and home olmesartan for now due to AKI. Strict I's and O's and daily weight  Chronic anxiety/depression Resume home regimen He is on Celexa and Klonopin, please verify doses   Essential hypertension BP stable Reduced dose of home Lopressor down to 12.5 mg twice daily. Hold off home irbesartan and p.o. Lasix due to AKI Monitor vital signs  Hyperlipidemia Resume home Lipitor 80 mg nightly  Iron deficiency anemia/ anemia of chronic disease Resume home iron supplement Hg  8.9 from 8.6, MCV 74 Maintain Hg>8.0  GERD Resume home PPI daily  Generalized weakness PT OT to assess Fall precautions TOC consulted to assist with DC planning.  Tobacco use disorder Smokes 2 to 3  cigarettes/day Difficult to provide tobacco cessation counseling due to dementia Nicotine patch Would greatly benefit from tobacco cessation   DVT prophylaxis: Subcu Lovenox daily  Code Status: Full code  Family Communication: None at bedside.  Updated his daughter Sunny SchleinFelicia via phone on 06/15/21.  Disposition Plan: Admit to telemetry medical  Consults called: Please consult vascular surgery in the AM.  Wound care specialist consulted for R foot wound.  Admission status: Observation status.   Status is: Observation    Dispo:  Patient From: Home  Planned Disposition: Home with Health Care Svc possibly on 06/16/2021.  Medically stable for discharge: No      Darlin Droparole N Marthann Abshier MD [facility] Pager [phone]  If 7PM-7AM, please contact night-coverage [email or web address] Password [email or web address]  06/15/2021, 9:31 PM

## 2021-06-15 NOTE — ED Provider Notes (Addendum)
[facility] EMERGENCY DEPARTMENT Provider Note   CSN: [id number] Arrival date & time: 06/15/21  1754     History No chief complaint on file.   Lonnie [name] is a 70 y.o. male.  HPI Hx from daughter by phone. She patients wife had COVID while patient was in rehab and they were not able to go in and visit and take care of him as usual.  When home from rehab, seemed confused yesterday. He was having difficulty using his walker and was not at baseline from before hospitalization. He was confused, taking his clothes off. He complained of needing to urinate but couldn't until late in the evening. Urine was thick like orange juice with foul smell. H/o frequent UTI. Has baseline dementia but usually alert and pretty good conversational, at baseline can walk with walker. Currently very different from baseline.    Past Medical History:  Diagnosis Date   Allergy    Anxiety    Arthritis    left neck, shoulder, knee   CAD (coronary artery disease)    a. anterior STEMI 10/2013 s/p 4V CABG with LIMA to mid LAD, SVG to OM, SVG to PDA, SVG to Diagonal.   Chronic combined systolic and diastolic CHF (congestive heart failure) (HCC)    Dementia (HCC)    Depression    Falls    Hypertension    Ischemic cardiomyopathy    a. EF 40-45% at time of CABG and in 2018.   MI (myocardial infarction) (HCC)    Peripheral neuropathy    Prediabetes 09/01/2014   PVD (peripheral vascular disease) (HCC)    a. s/p L SFA stents with now known bilateral SFA. b. right femoral to below the knee bypass by Dr. Myra Gianotti in 12/2019, c/b infection 02/2020.   Stroke [facility])    seen on CT Scan   Subdural hematoma (HCC)    Tobacco abuse    UTI (urinary tract infection)     Patient Active Problem List   Diagnosis Date Noted   AKI (acute kidney injury) (HCC) 06/15/2021   Ischemic cardiomyopathy    Sepsis secondary to UTI (HCC) 09/10/2020   Acute metabolic encephalopathy 09/10/2020   Closed fracture of  right patella 09/10/2020   PAD (peripheral artery disease) (HCC) 01/20/2020   Fronto-temporal dementia (HCC) 08/04/2019   Left shoulder pain    TBI (traumatic brain injury) (HCC)    E-coli UTI    Urinary retention    Postoperative pain    Agitation    SDH (subdural hematoma) (HCC) 07/08/2019   Subdural hematoma (HCC) 06/30/2019   Erectile dysfunction 07/28/2018   Peripheral vascular disease (HCC) 07/16/2017   Anxiety and depression 07/16/2017   PCP NOTES >>>>>>>>>>>> 07/08/2016   Dermatitis 04/19/2015   Elevated LFTs 02/12/2015   Hyperglycemia 09/01/2014   DJD (degenerative joint disease) 09/01/2014   Annual physical exam 09/01/2014   S/P CABG x 4 11/07/2013   HTN (hypertension) 10/29/2013   Tobacco abuse 10/29/2013   Coronary atherosclerosis of native coronary artery 10/29/2013    Past Surgical History:  Procedure Laterality Date   ABDOMINAL AORTAGRAM N/A 06/07/2014   Procedure: ABDOMINAL Ronny Flurry;  Surgeon: Iran Ouch, MD;  Location: [facility] CATH LAB;  Service: Cardiovascular;  Laterality: N/A;   ABDOMINAL AORTAGRAM N/A 03/07/2015   Procedure: ABDOMINAL Ronny Flurry;  Surgeon: Iran Ouch, MD;  Location: [facility] CATH LAB;  Service: Cardiovascular;  Laterality: N/A;   ABDOMINAL AORTOGRAM W/LOWER EXTREMITY N/A 01/18/2020   Procedure: ABDOMINAL AORTOGRAM W/LOWER EXTREMITY -  Right;  Surgeon: Iran Ouch, MD;  Location: [facility] INVASIVE CV LAB;  Service: Cardiovascular;  Laterality: N/A;   ANGIOPLASTY Right 06/08/2021   Procedure: BALLOON ANGIOPLASTY;  Surgeon: Nada Libman, MD;  Location: [facility] OR;  Service: Vascular;  Laterality: Right;   APPENDECTOMY     COLONOSCOPY WITH PROPOFOL N/A 10/11/2015   Procedure: COLONOSCOPY WITH PROPOFOL;  Surgeon: Charna Elizabeth, MD;  Location: [facility] ENDOSCOPY;  Service: Endoscopy;  Laterality: N/A;   CORONARY ARTERY BYPASS GRAFT N/A 11/04/2013   Procedure: CORONARY ARTERY BYPASS GRAFTING (CABG) TIMES FOUR  USING LEFT INTERNAL MAMMARY ARTERY AND RIGHT AND  LEFT SAPHENOUS LEG VEIN HARVESTED ENDOSCOPICALLY;  Surgeon: Loreli Slot, MD;  Location: [facility] OR;  Service: Open Heart Surgery;  Laterality: N/A;   CRANIOTOMY Left 06/30/2019   Procedure: Frontal CRANIOTOMY HEMATOMA EVACUATION SUBDURAL;  Surgeon: Lisbeth Renshaw, MD;  Location: [facility] OR;  Service: Neurosurgery;  Laterality: Left;  Frontal CRANIOTOMY HEMATOMA EVACUATION SUBDURAL   FEMORAL-POPLITEAL BYPASS GRAFT Right 01/20/2020   Procedure: BYPASS GRAFT FEMORAL-POPLITEAL ARTERY RIGHT LEG USING GORE PROPATEN GRAFT;  Surgeon: Nada Libman, MD;  Location: [facility] OR;  Service: Vascular;  Laterality: Right;   FINGER SURGERY  2017   injury   I & D EXTREMITY Right 03/02/2020   Procedure: IRRIGATION AND DEBRIDEMENT EXTREMITY right  lower leg  with Antibiotic beads.;  Surgeon: Chuck Hint, MD;  Location: [facility] OR;  Service: Vascular;  Laterality: Right;   INSERTION OF ILIAC STENT Right 06/08/2021   Procedure: INSERTION OF RIGHT UPPER LEG STENT;  Surgeon: Nada Libman, MD;  Location: [facility] OR;  Service: Vascular;  Laterality: Right;   KNEE SURGERY     fractured patella   LEFT HEART CATH N/A 10/29/2013   Procedure: LEFT HEART CATH;  Surgeon: Kathleene Hazel, MD;  Location: [facility] CATH LAB;  Service: Cardiovascular;  Laterality: N/A;   LEFT HEART CATHETERIZATION WITH CORONARY ANGIOGRAM N/A 10/31/2013   Procedure: LEFT HEART CATHETERIZATION WITH CORONARY ANGIOGRAM;  Surgeon: Kathleene Hazel, MD;  Location: [facility] CATH LAB;  Service: Cardiovascular;  Laterality: N/A;   LOWER EXTREMITY ANGIOGRAPHY Right 06/07/2021   Procedure: Lower Extremity Angiography;  Surgeon: Sherren Kerns, MD;  Location: [facility] INVASIVE CV LAB;  Service: Cardiovascular;  Laterality: Right;  W/ ABD    MOUTH SURGERY     PERIPHERAL VASCULAR THROMBECTOMY Right 06/07/2021   Procedure: PERIPHERAL VASCULAR THROMBECTOMY;  Surgeon: Sherren Kerns, MD;  Location: [facility] INVASIVE CV LAB;  Service: Cardiovascular;  Laterality: Right;    TOE SURGERY         Family History  Problem Relation Age of Onset   CAD Father 44   AAA (abdominal aortic aneurysm) Father    Alcohol abuse Father    Hypertension Father    Diabetes Father    Heart attack Father    Stroke Mother    Arthritis Mother    Heart disease Mother    Hypertension Mother    Heart attack Mother    Cardiomyopathy Daughter    Colon cancer Neg Hx    Prostate cancer Neg Hx     Social History   Tobacco Use   Smoking status: Every Day    Packs/day: 0.25    Years: 30.00    Pack years: 7.50    Types: Cigarettes   Smokeless tobacco: Never   Tobacco comments:    < 1/2 ppd  Vaping Use   Vaping Use: Never used  Substance Use Topics   Alcohol use: No  Alcohol/week: 0.0 standard drinks   Drug use: No    Home Medications Prior to Admission medications   Medication Sig Start Date End Date Taking? Authorizing Provider  acetaminophen (TYLENOL) 500 MG tablet Take 1,000 mg by mouth in the morning and at bedtime.   Yes [provider]  aspirin EC 81 MG tablet Take 81 mg by mouth daily.   Yes [provider]  atorvastatin (LIPITOR) 80 MG tablet Take 1 tablet (80 mg total) by mouth daily. Patient taking differently: Take 80 mg by mouth every evening. 05/08/21  Yes Paz, Nolon Rod, MD  Calcium Carb-Cholecalciferol (CALCIUM 600+D3 PO) Take 1 tablet by mouth daily with breakfast.   Yes [provider]  citalopram (CELEXA) 20 MG tablet Take 2 tablets (40 mg total) by mouth daily. Patient taking differently: Take 20-40 mg by mouth See admin instructions. Take 40 mg by mouth in the morning and 20 mg in the evening 05/15/21  Yes Paz, Elita Quick E, MD  clonazePAM (KLONOPIN) 0.25 MG disintegrating tablet DISSOLVE 1 TABLET ON THE  TONGUE TWICE DAILY AS  NEEDED Patient taking differently: Take 0.25 mg by mouth in the morning and at bedtime. 05/14/21  Yes Paz, Nolon Rod, MD  clopidogrel (PLAVIX) 75 MG tablet Take 1 tablet (75 mg total) by mouth daily. Patient  taking differently: Take 75 mg by mouth every evening. 02/05/21  Yes Paz, Nolon Rod, MD  docusate sodium (COLACE) 100 MG capsule Take 100 mg by mouth daily.   Yes [provider]  furosemide (LASIX) 20 MG tablet Take 1 tablet (20 mg total) by mouth daily as needed (ankle swelling). Patient taking differently: Take 20 mg by mouth daily. 03/13/21  Yes Paz, Nolon Rod, MD  gabapentin (NEURONTIN) 100 MG capsule Take 1 capsule (100 mg total) by mouth 2 (two) times daily. 05/08/21  Yes Paz, Nolon Rod, MD  irbesartan (AVAPRO) 300 MG tablet Take 1 tablet (300 mg total) by mouth daily. 05/08/21  Yes Paz, Nolon Rod, MD  melatonin 5 MG TABS Take 10 mg by mouth every evening.   Yes [provider]  metoprolol tartrate (LOPRESSOR) 25 MG tablet Take 1 tablet (25 mg total) by mouth 2 (two) times daily. Patient taking differently: Take 25 mg by mouth in the morning and at bedtime. 03/11/21  Yes Paz, Nolon Rod, MD  Multiple Vitamin (MULTIVITAMIN WITH MINERALS) TABS tablet Take 1 tablet by mouth daily.   Yes [provider]  pantoprazole (PROTONIX) 40 MG tablet Take 1 tablet (40 mg total) by mouth at bedtime. Patient taking differently: Take 40 mg by mouth every evening. 03/11/21  Yes Paz, Nolon Rod, MD  QUEtiapine (SEROQUEL) 200 MG tablet Take 1 tablet (200 mg total) by mouth 2 (two) times daily. Patient taking differently: Take 200 mg by mouth in the morning and at bedtime. 05/08/21  Yes Paz, Nolon Rod, MD  tamsulosin (FLOMAX) 0.4 MG CAPS capsule Take 2 capsules (0.8 mg total) by mouth daily after supper. Patient taking differently: Take 0.8 mg by mouth every evening. 11/08/20  Yes Paz, Nolon Rod, MD  ferrous fumarate (HEMOCYTE - 106 MG FE) 325 (106 Fe) MG TABS tablet Take 1 tablet by mouth before lunch and before dinner Patient not taking: No sig reported 06/03/21   [name], MD  Liniments (BEN GAY EX) Apply 1 application topically daily as needed (typically right knee).    [provider]  nicotine  (NICODERM CQ - DOSED IN MG/24 HR) 7 mg/24hr patch Place  1 patch (7 mg total) onto the skin daily. 09/12/20   Pokhrel, Rebekah ChesterfieldLaxman, MD  oxyCODONE (ROXICODONE) 5 MG immediate release tablet Take 1 tablet (5 mg total) by mouth every 6 (six) hours as needed. 06/14/21   Rhyne, Ames CoupeSamantha J, PA-C    Allergies    Ace inhibitors, Eggs or egg-derived products, Lactose intolerance (gi), and Latex  Review of Systems   Review of Systems Level V caveat dementia and confusion. Physical Exam Updated Vital Signs BP 140/73   Pulse (!) 59   Temp 99.1 F (37.3 C)   Resp 15   SpO2 99%   Physical Exam Constitutional:      Comments: No respiratory distress  HENT:     Head: Normocephalic and atraumatic.     Mouth/Throat:     Pharynx: Oropharynx is clear.  Eyes:     Extraocular Movements: Extraocular movements intact.  Cardiovascular:     Rate and Rhythm: Normal rate and regular rhythm.  Pulmonary:     Effort: Pulmonary effort is normal.     Breath sounds: Normal breath sounds.  Abdominal:     General: There is no distension.     Palpations: Abdomen is soft.     Tenderness: There is no abdominal tenderness. There is no guarding.  Musculoskeletal:     Comments: See attached images  Skin:    General: Skin is warm and dry.  Neurological:     General: No focal deficit present.     Mental Status: He is alert.        ED Results / Procedures / Treatments   Labs (all labs ordered are listed, but only abnormal results are displayed) Labs Reviewed  BASIC METABOLIC PANEL - Abnormal; Notable for the following components:      Result Value   Sodium 134 (*)    CO2 20 (*)    Glucose, Bld 129 (*)    BUN 32 (*)    Creatinine, Ser 1.49 (*)    GFR, Estimated 51 (*)    All other components within normal limits  CBC - Abnormal; Notable for the following components:   WBC 21.1 (*)    RBC 3.96 (*)    Hemoglobin 8.9 (*)    HCT 29.3 (*)    MCV 74.0 (*)    MCH 22.5 (*)    RDW 20.2 (*)    Platelets 479  (*)    All other components within normal limits  URINE CULTURE  CULTURE, BLOOD (ROUTINE X 2)  CULTURE, BLOOD (ROUTINE X 2)  LACTIC ACID, PLASMA  LACTIC ACID, PLASMA  DIFFERENTIAL  URINALYSIS, ROUTINE W REFLEX MICROSCOPIC  CBG MONITORING, ED    EKG EKG Interpretation  Date/Time:  Saturday June 15 2021 18:23:31 EDT Ventricular Rate:  79 PR Interval:  164 QRS Duration: 104 QT Interval:  432 QTC Calculation: 495 R Axis:   -56 Text Interpretation: Sinus rhythm with occasional Premature ventricular complexes Left anterior fascicular block Left ventricular hypertrophy Cannot rule out Septal infarct , age undetermined Abnormal ECG increased voltage compared to previous, otherwise similar. Confirmed by Arby BarrettePfeiffer, Lynnsey Barbara [phone]([id number]) on 06/15/2021 8:22:37 PM  Radiology DG Ankle Complete Right  Result Date: 06/15/2021 CLINICAL DATA:  Infection/injury to foot. EXAM: RIGHT ANKLE - COMPLETE 3+ VIEW.  Patient unable to straighten foot. COMPARISON:  None. FINDINGS: No cortical erosion or destruction. There is no evidence of fracture, dislocation, or joint effusion. There is no evidence of arthropathy or other focal bone abnormality. Soft tissues are unremarkable. IMPRESSION: 1.  No radiographic evidence of osteomyelitis. 2. No acute displaced fracture or dislocation. 3. Limited evaluation of the right ankle due to positioning. Electronically Signed   By: Tish Frederickson M.D.   On: 06/15/2021 19:42   CT Head Wo Contrast  Result Date: 06/15/2021 CLINICAL DATA:  Mental status change.  Unknown cause. EXAM: CT HEAD WITHOUT CONTRAST TECHNIQUE: Contiguous axial images were obtained from the base of the skull through the vertex without intravenous contrast. COMPARISON:  CT head 09/09/2020 FINDINGS: Brain: Cerebral ventricle sizes are concordant with the degree of cerebral volume loss. Patchy and confluent areas of decreased attenuation are noted throughout the deep and periventricular white matter of the cerebral  hemispheres bilaterally, compatible with chronic microvascular ischemic disease. No evidence of large-territorial acute infarction. No parenchymal hemorrhage. No mass lesion. No acute extra-axial collection. Similar-appearing right subdural hygroma. No mass effect or midline shift. No hydrocephalus. Basilar cisterns are patent. Redemonstration of a partial empty sella. Vascular: No hyperdense vessel. Skull: No acute fracture or focal lesion. Left parietal soft skull surgical hardware. Sinuses/Orbits: Mucosal thickening of all the paranasal sinuses. The mastoid air cells are clear. The orbits are unremarkable. Other: None. IMPRESSION: 1. No acute intracranial abnormality. 2. Pansinusitis. Electronically Signed   By: Tish Frederickson M.D.   On: 06/15/2021 19:45    Procedures Procedures   Medications Ordered in ED Medications  sodium chloride flush (NS) 0.9 % injection 3 mL (has no administration in time range)  cefTRIAXone (ROCEPHIN) 2 g in sodium chloride 0.9 % 100 mL IVPB (has no administration in time range)    ED Course  I have reviewed the triage vital signs and the nursing notes.  Pertinent labs & imaging results that were available during my care of the patient were reviewed by me and considered in my medical decision making (see chart for details).    MDM Rules/Calculators/A&P                          Patient presents as outlined with mental status changes since rehab.  Patient was exhibiting signs of delirium at home.  He was taking off all of his clothing, confused and having difficulty urinating.  Patient has history of frequent UTI.  He is alert and interactive in the emergency department.  Blood pressure stable.  Heart rate stable.  Returns at 99.1.  At this time he does not meet sepsis criteria.  We will proceed with complete infectious work-up.  We will also proceed with bladder scan.  Patient has had chronic wound of the lower extremity.  This was imaged.  Patient has known arterial  insufficiency.  Patient was not discharged on antibiotics.  If confirmatory signs of infection identified, or vital signs start to indicate SIRS, anticipate admission.  Patient has urinary retention and AKI.  White count 21,000.  Patient started on Rocephin for high suspicion UTI.  Patient also has had mental status change, but this time no hypotension, fever, tachypnea or tachycardia to initiate sepsis protocol.  Patient also has foot wound which may have associated cellulitis.  See attached images. Final Clinical Impression(s) / ED Diagnoses Final diagnoses:  Altered mental status, unspecified altered mental status type  Ulcer of right foot, limited to breakdown of skin (HCC)  Urinary retention  AKI (acute kidney injury) [facility])    Rx / DC Orders ED Discharge Orders     None        Arby Barrette, MD 06/15/21 2025  Arby Barrette, MD 06/15/21 2129

## 2021-06-16 ENCOUNTER — Other Ambulatory Visit: Payer: Self-pay

## 2021-06-16 ENCOUNTER — Encounter (HOSPITAL_COMMUNITY): Payer: Self-pay | Admitting: Internal Medicine

## 2021-06-16 ENCOUNTER — Observation Stay (HOSPITAL_COMMUNITY): Payer: Medicare Other

## 2021-06-16 DIAGNOSIS — D75839 Thrombocytosis, unspecified: Secondary | ICD-10-CM | POA: Diagnosis present

## 2021-06-16 DIAGNOSIS — F0390 Unspecified dementia without behavioral disturbance: Secondary | ICD-10-CM | POA: Diagnosis present

## 2021-06-16 DIAGNOSIS — L97511 Non-pressure chronic ulcer of other part of right foot limited to breakdown of skin: Secondary | ICD-10-CM | POA: Diagnosis not present

## 2021-06-16 DIAGNOSIS — Z89611 Acquired absence of right leg above knee: Secondary | ICD-10-CM | POA: Diagnosis not present

## 2021-06-16 DIAGNOSIS — F32A Depression, unspecified: Secondary | ICD-10-CM | POA: Diagnosis present

## 2021-06-16 DIAGNOSIS — Z9889 Other specified postprocedural states: Secondary | ICD-10-CM | POA: Diagnosis not present

## 2021-06-16 DIAGNOSIS — I5042 Chronic combined systolic (congestive) and diastolic (congestive) heart failure: Secondary | ICD-10-CM | POA: Diagnosis not present

## 2021-06-16 DIAGNOSIS — A4152 Sepsis due to Pseudomonas: Secondary | ICD-10-CM | POA: Diagnosis not present

## 2021-06-16 DIAGNOSIS — Y832 Surgical operation with anastomosis, bypass or graft as the cause of abnormal reaction of the patient, or of later complication, without mention of misadventure at the time of the procedure: Secondary | ICD-10-CM | POA: Diagnosis present

## 2021-06-16 DIAGNOSIS — R339 Retention of urine, unspecified: Secondary | ICD-10-CM | POA: Diagnosis present

## 2021-06-16 DIAGNOSIS — T82838A Hemorrhage of vascular prosthetic devices, implants and grafts, initial encounter: Secondary | ICD-10-CM | POA: Diagnosis not present

## 2021-06-16 DIAGNOSIS — K760 Fatty (change of) liver, not elsewhere classified: Secondary | ICD-10-CM | POA: Diagnosis present

## 2021-06-16 DIAGNOSIS — T827XXD Infection and inflammatory reaction due to other cardiac and vascular devices, implants and grafts, subsequent encounter: Secondary | ICD-10-CM | POA: Diagnosis not present

## 2021-06-16 DIAGNOSIS — D62 Acute posthemorrhagic anemia: Secondary | ICD-10-CM | POA: Diagnosis not present

## 2021-06-16 DIAGNOSIS — E871 Hypo-osmolality and hyponatremia: Secondary | ICD-10-CM | POA: Diagnosis present

## 2021-06-16 DIAGNOSIS — E785 Hyperlipidemia, unspecified: Secondary | ICD-10-CM | POA: Diagnosis present

## 2021-06-16 DIAGNOSIS — J1282 Pneumonia due to coronavirus disease 2019: Secondary | ICD-10-CM | POA: Diagnosis not present

## 2021-06-16 DIAGNOSIS — K7200 Acute and subacute hepatic failure without coma: Secondary | ICD-10-CM | POA: Diagnosis not present

## 2021-06-16 DIAGNOSIS — I517 Cardiomegaly: Secondary | ICD-10-CM | POA: Diagnosis not present

## 2021-06-16 DIAGNOSIS — D72829 Elevated white blood cell count, unspecified: Secondary | ICD-10-CM | POA: Diagnosis not present

## 2021-06-16 DIAGNOSIS — U071 COVID-19: Secondary | ICD-10-CM | POA: Diagnosis not present

## 2021-06-16 DIAGNOSIS — G9341 Metabolic encephalopathy: Secondary | ICD-10-CM | POA: Diagnosis not present

## 2021-06-16 DIAGNOSIS — I96 Gangrene, not elsewhere classified: Secondary | ICD-10-CM | POA: Diagnosis not present

## 2021-06-16 DIAGNOSIS — I5030 Unspecified diastolic (congestive) heart failure: Secondary | ICD-10-CM | POA: Diagnosis not present

## 2021-06-16 DIAGNOSIS — F17200 Nicotine dependence, unspecified, uncomplicated: Secondary | ICD-10-CM | POA: Diagnosis not present

## 2021-06-16 DIAGNOSIS — R7881 Bacteremia: Secondary | ICD-10-CM | POA: Diagnosis not present

## 2021-06-16 DIAGNOSIS — R578 Other shock: Secondary | ICD-10-CM | POA: Diagnosis not present

## 2021-06-16 DIAGNOSIS — N179 Acute kidney failure, unspecified: Secondary | ICD-10-CM

## 2021-06-16 DIAGNOSIS — I70661 Atherosclerosis of nonbiological bypass graft(s) of the extremities with gangrene, right leg: Secondary | ICD-10-CM | POA: Diagnosis not present

## 2021-06-16 DIAGNOSIS — M7989 Other specified soft tissue disorders: Secondary | ICD-10-CM | POA: Diagnosis not present

## 2021-06-16 DIAGNOSIS — K5900 Constipation, unspecified: Secondary | ICD-10-CM | POA: Diagnosis not present

## 2021-06-16 DIAGNOSIS — B965 Pseudomonas (aeruginosa) (mallei) (pseudomallei) as the cause of diseases classified elsewhere: Secondary | ICD-10-CM | POA: Diagnosis not present

## 2021-06-16 DIAGNOSIS — R571 Hypovolemic shock: Secondary | ICD-10-CM | POA: Diagnosis not present

## 2021-06-16 DIAGNOSIS — R748 Abnormal levels of other serum enzymes: Secondary | ICD-10-CM | POA: Diagnosis not present

## 2021-06-16 DIAGNOSIS — D638 Anemia in other chronic diseases classified elsewhere: Secondary | ICD-10-CM | POA: Diagnosis present

## 2021-06-16 DIAGNOSIS — Z66 Do not resuscitate: Secondary | ICD-10-CM | POA: Diagnosis not present

## 2021-06-16 DIAGNOSIS — I110 Hypertensive heart disease with heart failure: Secondary | ICD-10-CM | POA: Diagnosis present

## 2021-06-16 DIAGNOSIS — R4182 Altered mental status, unspecified: Secondary | ICD-10-CM | POA: Diagnosis not present

## 2021-06-16 DIAGNOSIS — D509 Iron deficiency anemia, unspecified: Secondary | ICD-10-CM | POA: Diagnosis not present

## 2021-06-16 DIAGNOSIS — T827XXA Infection and inflammatory reaction due to other cardiac and vascular devices, implants and grafts, initial encounter: Secondary | ICD-10-CM | POA: Diagnosis not present

## 2021-06-16 DIAGNOSIS — S81802S Unspecified open wound, left lower leg, sequela: Secondary | ICD-10-CM | POA: Diagnosis not present

## 2021-06-16 DIAGNOSIS — R0602 Shortness of breath: Secondary | ICD-10-CM | POA: Diagnosis not present

## 2021-06-16 DIAGNOSIS — E872 Acidosis: Secondary | ICD-10-CM | POA: Diagnosis present

## 2021-06-16 LAB — CBC WITH DIFFERENTIAL/PLATELET
Abs Immature Granulocytes: 0.34 10*3/uL — ABNORMAL HIGH (ref 0.00–0.07)
Basophils Absolute: 0 10*3/uL (ref 0.0–0.1)
Basophils Relative: 0 %
Eosinophils Absolute: 0.2 10*3/uL (ref 0.0–0.5)
Eosinophils Relative: 1 %
HCT: 27.2 % — ABNORMAL LOW (ref 39.0–52.0)
Hemoglobin: 8.4 g/dL — ABNORMAL LOW (ref 13.0–17.0)
Immature Granulocytes: 2 %
Lymphocytes Relative: 10 %
Lymphs Abs: 1.7 10*3/uL (ref 0.7–4.0)
MCH: 22.2 pg — ABNORMAL LOW (ref 26.0–34.0)
MCHC: 30.9 g/dL (ref 30.0–36.0)
MCV: 71.8 fL — ABNORMAL LOW (ref 80.0–100.0)
Monocytes Absolute: 1 10*3/uL (ref 0.1–1.0)
Monocytes Relative: 6 %
Neutro Abs: 13.8 10*3/uL — ABNORMAL HIGH (ref 1.7–7.7)
Neutrophils Relative %: 81 %
Platelets: 486 10*3/uL — ABNORMAL HIGH (ref 150–400)
RBC: 3.79 MIL/uL — ABNORMAL LOW (ref 4.22–5.81)
RDW: 19.9 % — ABNORMAL HIGH (ref 11.5–15.5)
WBC: 17 10*3/uL — ABNORMAL HIGH (ref 4.0–10.5)
nRBC: 0 % (ref 0.0–0.2)

## 2021-06-16 LAB — PROCALCITONIN: Procalcitonin: 0.34 ng/mL

## 2021-06-16 LAB — COMPREHENSIVE METABOLIC PANEL
ALT: 135 U/L — ABNORMAL HIGH (ref 0–44)
AST: 138 U/L — ABNORMAL HIGH (ref 15–41)
Albumin: 2.4 g/dL — ABNORMAL LOW (ref 3.5–5.0)
Alkaline Phosphatase: 154 U/L — ABNORMAL HIGH (ref 38–126)
Anion gap: 9 (ref 5–15)
BUN: 23 mg/dL (ref 8–23)
CO2: 22 mmol/L (ref 22–32)
Calcium: 8.5 mg/dL — ABNORMAL LOW (ref 8.9–10.3)
Chloride: 105 mmol/L (ref 98–111)
Creatinine, Ser: 0.97 mg/dL (ref 0.61–1.24)
GFR, Estimated: >60 mL/min (ref >60)
Glucose, Bld: 111 mg/dL — ABNORMAL HIGH (ref 70–99)
Potassium: 3.8 mmol/L (ref 3.5–5.1)
Sodium: 136 mmol/L (ref 135–145)
Total Bilirubin: 0.5 mg/dL (ref 0.3–1.2)
Total Protein: 7 g/dL (ref 6.5–8.1)

## 2021-06-16 LAB — D-DIMER, QUANTITATIVE: D-Dimer, Quant: 2.21 ug/mL-FEU — ABNORMAL HIGH (ref 0.00–0.50)

## 2021-06-16 LAB — MRSA NEXT GEN BY PCR, NASAL: MRSA by PCR Next Gen: DETECTED — AB

## 2021-06-16 LAB — RESP PANEL BY RT-PCR (FLU A&B, COVID) ARPGX2
Influenza A by PCR: NEGATIVE
Influenza B by PCR: NEGATIVE
SARS Coronavirus 2 by RT PCR: POSITIVE — AB

## 2021-06-16 LAB — PHOSPHORUS: Phosphorus: 3.3 mg/dL (ref 2.5–4.6)

## 2021-06-16 LAB — FERRITIN: Ferritin: 84 ng/mL (ref 24–336)

## 2021-06-16 LAB — C-REACTIVE PROTEIN: CRP: 29.5 mg/dL — ABNORMAL HIGH (ref <1.0)

## 2021-06-16 LAB — LACTIC ACID, PLASMA
Lactic Acid, Venous: 1.5 mmol/L (ref 0.5–1.9)
Lactic Acid, Venous: 1.3 mmol/L (ref 0.5–1.9)

## 2021-06-16 LAB — LACTATE DEHYDROGENASE: LDH: 206 U/L — ABNORMAL HIGH (ref 98–192)

## 2021-06-16 LAB — BRAIN NATRIURETIC PEPTIDE: B Natriuretic Peptide: 365.5 pg/mL — ABNORMAL HIGH (ref 0.0–100.0)

## 2021-06-16 LAB — MAGNESIUM: Magnesium: 2.2 mg/dL (ref 1.7–2.4)

## 2021-06-16 LAB — FIBRINOGEN: Fibrinogen: 745 mg/dL — ABNORMAL HIGH (ref 210–475)

## 2021-06-16 IMAGING — DX DG CHEST 1V PORT
1 series · 1 of 1 positions shown · non-contrast
Comparison: Prior radiograph from [DATE].

CLINICAL DATA: Initial evaluation for acute shortness of breath.

EXAM:
PORTABLE CHEST 1 VIEW

[chest ap]
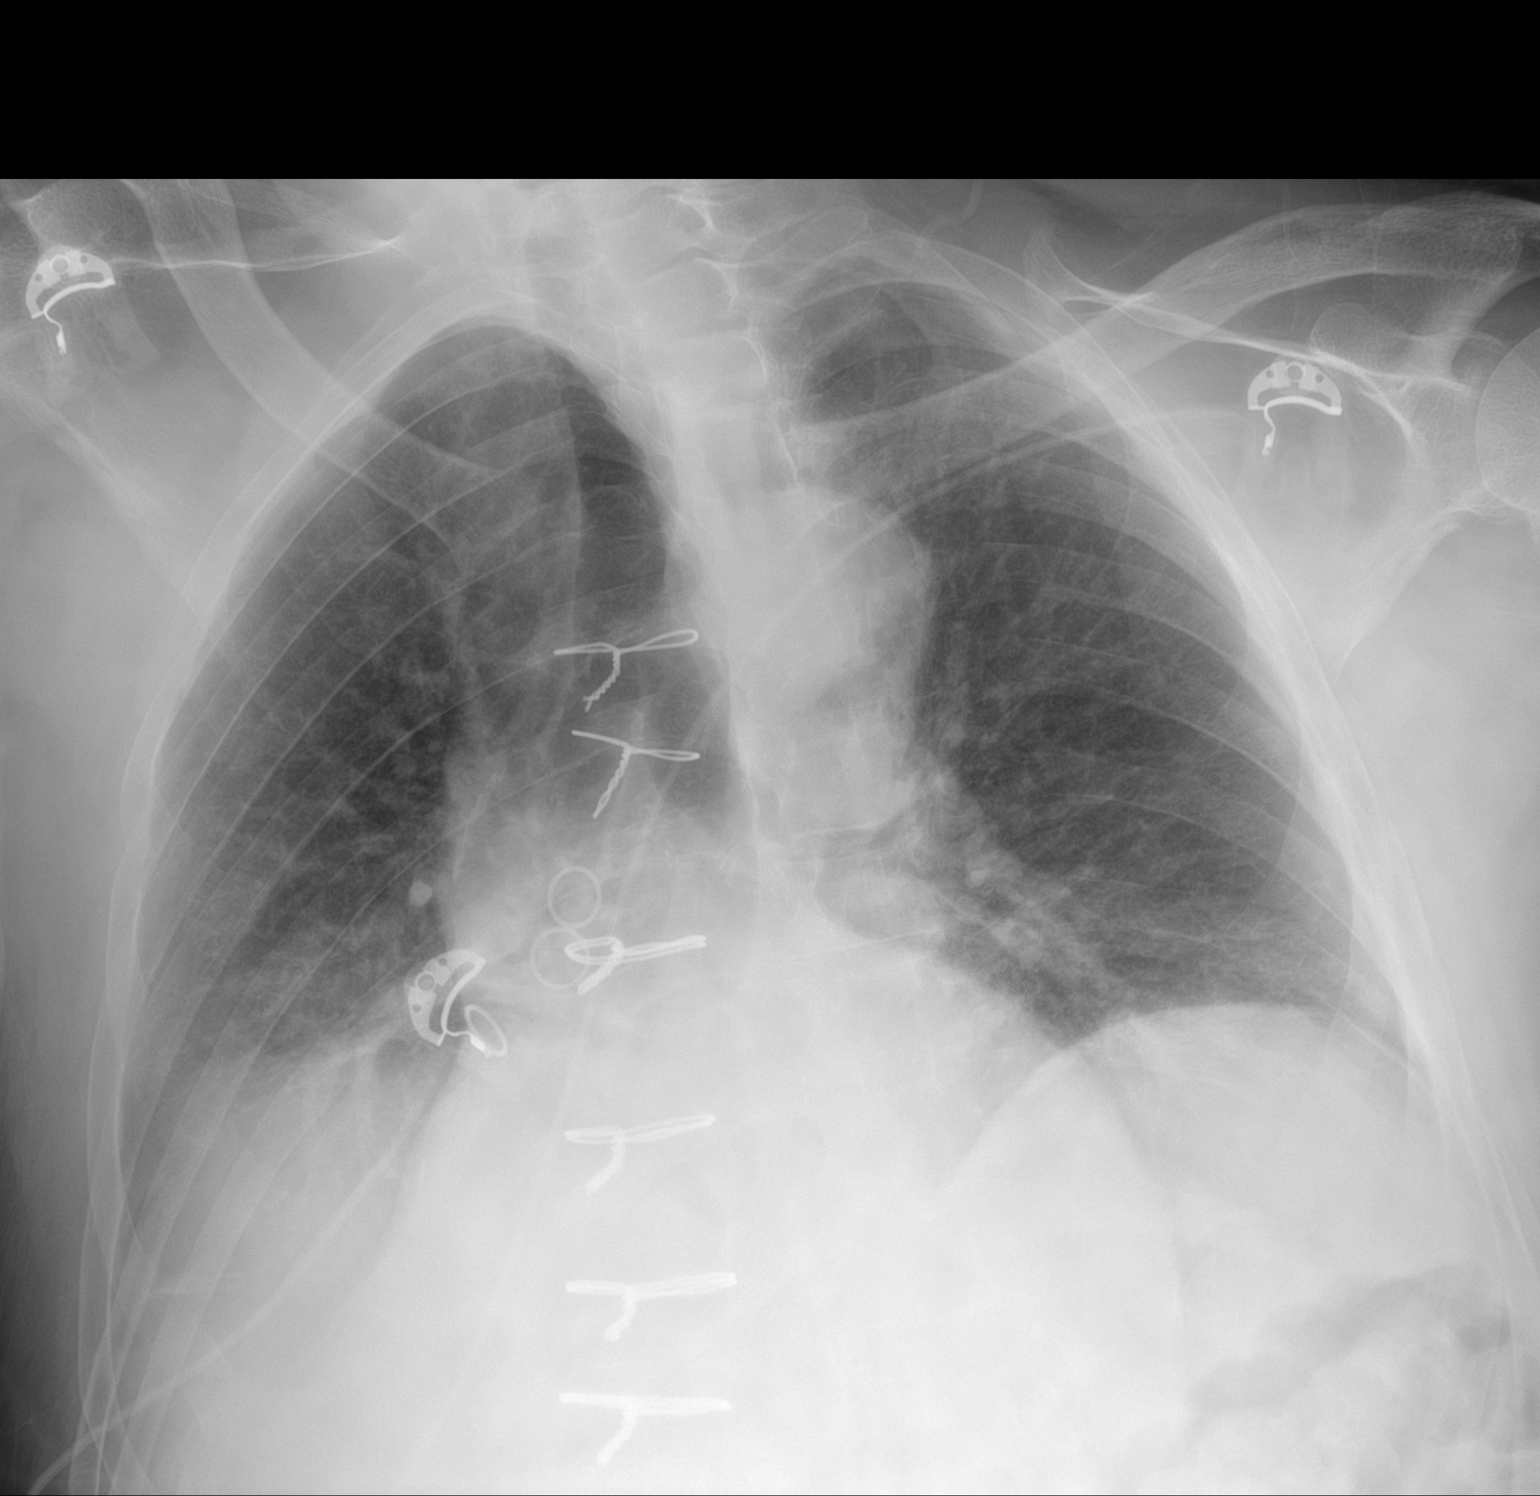

[1 of 1 positions shown; findings below may reference images not displayed]

FINDINGS: Patient is markedly rotated to the right. Median sternotomy wires
underlying CABG markers noted. Cardiomegaly grossly stable allowing
for rotation. Mediastinal silhouette within normal limits.

Lungs hypoinflated. Mildly increased pulmonary vascular and
interstitial prominence, suggesting pulmonary interstitial
congestion. Increased patchy opacity at the right lung base, which
could reflect atelectasis or infiltrate. Veiling opacity overlying
the right lung base suggestive of an associated small pleural
effusion. No pneumothorax.

Osseous structures unchanged.
IMPRESSION: 1. Increased patchy opacities at the right lung base, which could
reflect atelectasis or infiltrate. Superimposed veiling opacity
suggestive of a small pleural effusion.
2. Cardiomegaly with underlying mild diffuse pulmonary interstitial
congestion.

## 2021-06-16 MED ORDER — SODIUM CHLORIDE 0.9 % IV SOLN
100.0000 mg | Freq: Every day | INTRAVENOUS | Status: AC
  Administered 2021-06-17 – 2021-06-18 (×2): 100 mg via INTRAVENOUS
  Filled 2021-06-16 (×2): qty 20

## 2021-06-16 MED ORDER — SODIUM CHLORIDE 0.9 % IV SOLN
200.0000 mg | Freq: Once | INTRAVENOUS | Status: AC
  Administered 2021-06-16: 200 mg via INTRAVENOUS
  Filled 2021-06-16: qty 40

## 2021-06-16 MED ORDER — MELATONIN 5 MG PO TABS
10.0000 mg | ORAL_TABLET | Freq: Every evening | ORAL | Status: DC
  Administered 2021-06-16 – 2021-06-18 (×3): 10 mg via ORAL
  Filled 2021-06-16 (×3): qty 2

## 2021-06-16 MED ORDER — ADULT MULTIVITAMIN W/MINERALS CH
1.0000 | ORAL_TABLET | Freq: Every day | ORAL | Status: DC
  Administered 2021-06-16 – 2021-07-12 (×25): 1 via ORAL
  Filled 2021-06-16 (×27): qty 1

## 2021-06-16 MED ORDER — CHLORHEXIDINE GLUCONATE CLOTH 2 % EX PADS
6.0000 | MEDICATED_PAD | Freq: Every day | CUTANEOUS | Status: DC
  Administered 2021-06-16 – 2021-07-12 (×24): 6 via TOPICAL

## 2021-06-16 MED ORDER — GUAIFENESIN-DM 100-10 MG/5ML PO SYRP: 10.0000 mL | ORAL_SOLUTION | ORAL | Status: DC | PRN

## 2021-06-16 MED ORDER — DEXAMETHASONE SODIUM PHOSPHATE 10 MG/ML IJ SOLN
6.0000 mg | INTRAMUSCULAR | Status: DC
  Administered 2021-06-16 – 2021-06-19 (×4): 6 mg via INTRAVENOUS
  Filled 2021-06-16 (×4): qty 0.6

## 2021-06-16 MED ORDER — CLOPIDOGREL BISULFATE 75 MG PO TABS
75.0000 mg | ORAL_TABLET | Freq: Every day | ORAL | Status: DC
  Administered 2021-06-16 – 2021-07-12 (×27): 75 mg via ORAL
  Filled 2021-06-16 (×27): qty 1

## 2021-06-16 MED ORDER — FUROSEMIDE 20 MG PO TABS
20.0000 mg | ORAL_TABLET | Freq: Every day | ORAL | Status: DC
  Administered 2021-06-16 – 2021-06-24 (×9): 20 mg via ORAL
  Filled 2021-06-16 (×9): qty 1

## 2021-06-16 MED ORDER — FERROUS FUMARATE 325 (106 FE) MG PO TABS: 1.0000 | ORAL_TABLET | Freq: Every day | ORAL | Status: DC

## 2021-06-16 MED ORDER — VANCOMYCIN HCL 1000 MG/200ML IV SOLN
1000.0000 mg | Freq: Two times a day (BID) | INTRAVENOUS | Status: DC
  Administered 2021-06-16 – 2021-06-17 (×2): 1000 mg via INTRAVENOUS
  Filled 2021-06-16 (×3): qty 200

## 2021-06-16 MED ORDER — FE FUMARATE-B12-VIT C-FA-IFC PO CAPS
1.0000 | ORAL_CAPSULE | Freq: Every day | ORAL | Status: DC
  Administered 2021-06-16 – 2021-07-12 (×26): 1 via ORAL
  Filled 2021-06-16 (×27): qty 1

## 2021-06-16 MED ORDER — ALBUTEROL SULFATE HFA 108 (90 BASE) MCG/ACT IN AERS
2.0000 | INHALATION_SPRAY | RESPIRATORY_TRACT | Status: DC | PRN
  Filled 2021-06-16: qty 6.7

## 2021-06-16 MED ORDER — VANCOMYCIN HCL 1750 MG/350ML IV SOLN
1750.0000 mg | Freq: Once | INTRAVENOUS | Status: AC
  Administered 2021-06-16: 1750 mg via INTRAVENOUS
  Filled 2021-06-16: qty 350

## 2021-06-16 NOTE — Progress Notes (Signed)
[facility] night shift.  The nursing staff communicated to [place] that the patient's COVID PCR was positive.  He was brought in with altered mental status in the setting of AKI with presumptive UTI.  However, his oxygen saturation has decreased to 93% on room air.  The COVID order set was initiated.  We will follow on inflammatory markers and chest radiograph.  Remdesivir therapeutic regimen has been ordered.  Sanda Klein, MD.

## 2021-06-16 NOTE — Progress Notes (Signed)
PROGRESS NOTE  Lonnie [name] ZOX:[id number] DOB: January 30, 1951   PCP: [name], MD  Patient is from: Home  DOA: 06/15/2021 LOS: 0  Chief complaints:  Confusion, generalized weakness and urinary retention  Brief Narrative / Interim history: 70 year old M with PMH of dementia, PAD s/p mechanical thrombectomy and stenting of occluded femoral popliteal bypass on 6/18, ongoing tobacco use, HTN, HLD, BPH, GERD, anxiety and depression returning with confusion, generalized weakness and urinary retention.  Patient was discharged to SNF and went home from SNF on 06/14/2021.    In ED, RLQ drain in situ with bladder scan showing 900 cc, AKI and possible UTI and right foot infection.  Hemodynamically stable.  Mild temp to 99.1.  Initially saturating at 99% on RA. Cr 1.49 (baseline 0.91).  COVID-19 PCR returned positive.  Patient was started on ceftriaxone for UTI and Zyvox for possible right foot infection, and Decadron and remdesivir for COVID-19 infection with desaturation to 93% on RA.  Subjective: Seen and examined earlier this morning.  No major events overnight of this morning.  No complaints but not a great historian.  He responds no to pain, shortness of breath, cough or GI symptoms.  He is a sleepy but wakes to voice easily.  He is oriented to self, "[facility] hospital" but thinks he is in [place].  He knows he has COVID-19 infection.  Objective: Vitals:   06/16/21 0102 06/16/21 0300 06/16/21 0618 06/16/21 0957  BP: 138/64  139/67 137/68  Pulse: 64  95 90  Resp: Temp: 98.4 F (36.9 C)  99 F (37.2 C) 99 F (37.2 C)  TempSrc:   Oral Oral  SpO2: 93%  93% 96%  Weight:  84.9 kg    Height:   (1.88 m)      Intake/Output Summary (Last 24 hours) at 06/16/2021 1133 Last data filed at 06/16/2021 0900 Gross per 24 hour  Intake 818.71 ml  Output 350 ml  Net 468.71 ml   Filed Weights   06/16/21 0300  Weight: 84.9 kg    Examination:  GENERAL: No apparent distress.   Nontoxic. HEENT: MMM.  Vision and hearing grossly intact.  NECK: Supple.  No apparent JVD.  RESP: On RA.  No IWOB.  Fair aeration bilaterally. CVS:  RRR. Heart sounds normal.  ABD/GI/GU: BS+. Abd soft, NTND.  MSK/EXT:  Moves extremities.  2+ edema in RLE.  Skin exfoliation/wound over the dorsal aspect of right foot.  See picture below for more. SKIN: no apparent skin lesion or wound NEURO: Sleepy but wakes to voice easily.  Oriented to self and place.  No apparent focal neuro deficit. PSYCH: Calm. Normal affect.   Procedures:  None  Microbiology summarized: COVID-19 PCR positive. MRSA PCR pending Blood cultures pending Urine cultures pending  Assessment & Plan: AKI/azotemia: Likely mix of prerenal and postobstructive. He is also on Lasix and Avapro which could contribute.  Resolved. Recent Labs    09/10/20 0700 09/11/20 1231 09/12/20 0357 09/26/20 1515 05/24/21 1549 06/07/21 1025 06/08/21 0317 06/10/21 0249 06/15/21 1809 06/16/21 0553  BUN 32* 23  CREATININE 1.31* 1.08 1.04 1.16 0.94 1.00 0.98 0.91 1.49* 0.97  -Discontinue IV fluid -Avoid nephrotoxic meds -Continue indwelling Foley  Right foot ulcer/wound PAD s/p recent mechanical thrombectomy and stent of occluded femoropopliteal bypass -Wound seems to be about the same but significant edema in right leg. -Change linezolid to vancomycin given risk for serotonin syndrome  with Celexa. -Vascular surgery consulted -Appreciate help by WOCN  Acute metabolic encephalopathy-could be from UTI, COVID-19 infection, polypharmacy, delirium in the setting of dementia.  He is oriented to self and "[facility]" -Treat treatable causes -Reorientation and delirium precautions -May have to cut down on his Seroquel, gabapentin and Klonopin if worse.   Acute respiratory failure with hypoxia due to COVID-19 pneumonia: symptomatic for 1 day.  Tested positive on 6/25.  Desaturated to 93% on RA.  CXR with  increased patchy opacities RIGHT lung base: Cardiomegaly and mild diffuse pulmonary interstitial congestion. CRP 29. Now saturating at 96% on RA.  Recent Labs    06/16/21 0553  DDIMER 2.21*  FERRITIN 84  LDH 206*  CRP 29.5*  -Started on Decadron and remdesivir-continue -Already on ceftriaxone for UTI and vancomycin for possible right foot infection  -Subcu Lovenox for VTE prophylaxis. -Protonix for GI prophylaxis -Monitor inflammatory markers.  -Lower extremity venous Doppler to exclude DVT   Acute urinary retention?  Bladder scan with 900 cc but it seems only 350 cc output after Foley placement.  -Continue Flomax -Initiate voiding trial once able to ambulate  Presumptive UTI: UA with moderate pyuria and bacteriuria.  Has leukocytosis as well. -Continue IV ceftriaxone  Elevated liver enzymes: due to COVID-19 infection.  -Check CK -Treat COVID-19 as above -Continue trending -Further work-up if worse.   ICM/chronic combined CHF: TTE in 2018 with LVEF of 40 to 45%, G1-DD.  Appears to have significant edema in RLE likely from recent revascularization.  However, CXR with some interstitial venous congestion.  BNP 365 but no prior to compare to.  He is on p.o. Lasix at home, which is on hold in the setting of AKI -Discontinue IV fluid -Resume home p.o. Lasix -Monitor fluid and respiratory status  History of CAD s/p CABG: Stable. -Continue Lopressor, aspirin, Plavix, Lipito   Chronic anxiety/depression: Stable -Continue home meds   Essential hypertension: Normotensive. -Cardiac meds as above   Hyperlipidemia -Continue home Lipitor   Iron deficiency anemia/ anemia of chronic disease: Stable. Recent Labs    06/07/21 1025 06/07/21 1619 06/07/21 2043 06/08/21 0317 06/08/21 1435 06/09/21 1055 06/09/21 1828 06/10/21 0249 06/15/21 1809 06/16/21 0553  HGB 8.8* 7.8* 8.8* 7.6* 7.5* 5.8* 8.7* 8.6* 8.9* 8.4*  -Monitor H&H  GERD -Home PPI   Generalized weakness: Ambulates  using walker at baseline. -PT/OT   Tobacco use disorder: smokes 2 to 3 cigarettes/day -Counseled family. -Nicotine patch  Goal of care counseling: Discussed with patient's daughter, [name].  [name] says patient has DNR and DNI. I have changed CODE STATUS to DNR/DNI and notify patient's RN.  Body mass index is 24.03 kg/m.         DVT prophylaxis:  enoxaparin (LOVENOX) injection 40 mg Start: 06/15/21 2200 SCDs Start: 06/15/21 2150  Code Status: DNR/DNI Family Communication: Patient and/or RN.  Updated patient's daughter over the phone. Level of care: Telemetry Medical Status is: Observation  The patient will require care spanning > 2 midnights and should be moved to inpatient because: Altered mental status, IV treatments appropriate due to intensity of illness or inability to take PO, and Inpatient level of care appropriate due to severity of illness  Dispo:  Patient From: Home  Planned Disposition: Home with Health Care Svc  Medically stable for discharge: No      Consultants:  Vascular surgery   Sch Meds:  Scheduled Meds:  aspirin EC  81 mg Oral Daily   atorvastatin  80 mg Oral QPM  citalopram  40 mg Oral Daily   dexamethasone (DECADRON) injection  6 mg Intravenous Q24H   enoxaparin (LOVENOX) injection  40 mg Subcutaneous Q24H   gabapentin  100 mg Oral BID   metoprolol tartrate  12.5 mg Oral BID   nicotine  7 mg Transdermal Daily   pantoprazole  40 mg Oral QPM   QUEtiapine  200 mg Oral BID   sodium chloride flush  3 mL Intravenous Once   tamsulosin  0.8 mg Oral QPM   Continuous Infusions:  cefTRIAXone (ROCEPHIN)  IV     lactated ringers 30 mL/hr at 06/16/21 0334   [START ON 06/17/2021] remdesivir 100 mg in NS 100 mL     vancomycin     PRN Meds:.acetaminophen, albuterol, clonazePAM, guaiFENesin-dextromethorphan, melatonin, polyethylene glycol, prochlorperazine  Antimicrobials: Anti-infectives (From admission, onward)    Start     Dose/Rate Route  Frequency Ordered Stop   06/17/21 1000  remdesivir 100 mg in sodium chloride 0.9 % 100 mL IVPB       See Hyperspace for full Linked Orders Report.   100 mg 200 mL/hr over 30 Minutes Intravenous Daily 06/16/21 0135 06/21/21 0959   06/16/21 2200  cefTRIAXone (ROCEPHIN) 2 g in sodium chloride 0.9 % 100 mL IVPB        2 g 200 mL/hr over 30 Minutes Intravenous Every 24 hours 06/15/21 2304     06/16/21 2200  vancomycin (VANCOREADY) IVPB 1000 mg/200 mL        1,000 mg 200 mL/hr over 60 Minutes Intravenous Every 12 hours 06/16/21 0820     06/16/21 0915  vancomycin (VANCOREADY) IVPB 1750 mg/350 mL        1,750 mg 175 mL/hr over 120 Minutes Intravenous  Once 06/16/21 0820 06/16/21 1114   06/16/21 0230  remdesivir 200 mg in sodium chloride 0.9% 250 mL IVPB       See Hyperspace for full Linked Orders Report.   200 mg 580 mL/hr over 30 Minutes Intravenous Once 06/16/21 0135 06/16/21 0324   06/15/21 2230  linezolid (ZYVOX) IVPB 600 mg  Status:  Discontinued        600 mg 300 mL/hr over 60 Minutes Intravenous Every 12 hours 06/15/21 2228 06/16/21 0811   06/15/21 2115  cefTRIAXone (ROCEPHIN) 2 g in sodium chloride 0.9 % 100 mL IVPB        2 g 200 mL/hr over 30 Minutes Intravenous  Once 06/15/21 2114 06/15/21 2328        I have personally reviewed the following labs and images: CBC: Recent Labs  Lab 06/09/21 1828 06/10/21 0249 06/15/21 1809 06/16/21 0553  WBC  --  18.7* 21.1* 17.0*  NEUTROABS  --   --   --  13.8*  HGB 8.7* 8.6* 8.9* 8.4*  HCT 28.1* 27.6* 29.3* 27.2*  MCV  --  73.0* 74.0* 71.8*  PLT  --  269 479* 486*   BMP &GFR Recent Labs  Lab 06/10/21 0249 06/15/21 1809 06/16/21 0553  NA 135 134* 136  K 3.6 4.1 3.8  CL 103 101 105  CO2 21* 20* 22  GLUCOSE 98 129* 111*  BUN 18 32* 23  CREATININE 0.91 1.49* 0.97  CALCIUM 8.5* 9.1 8.5*  MG  --   --  2.2  PHOS  --   --  3.3   Estimated Creatinine Clearance: 84.7 mL/min (by C-G formula based on SCr of 0.97 mg/dL). Liver &  Pancreas: Recent Labs  Lab 06/16/21 0553  AST 138*  ALT  135*  ALKPHOS 154*  BILITOT 0.5  PROT 7.0  ALBUMIN 2.4*   No results for input(s): LIPASE, AMYLASE in the last 168 hours. No results for input(s): AMMONIA in the last 168 hours. Diabetic: No results for input(s): HGBA1C in the last 72 hours. No results for input(s): GLUCAP in the last 168 hours. Cardiac Enzymes: No results for input(s): CKTOTAL, CKMB, CKMBINDEX, TROPONINI in the last 168 hours. No results for input(s): PROBNP in the last 8760 hours. Coagulation Profile: No results for input(s): INR, PROTIME in the last 168 hours. Thyroid Function Tests: No results for input(s): TSH, T4TOTAL, FREET4, T3FREE, THYROIDAB in the last 72 hours. Lipid Profile: No results for input(s): CHOL, HDL, LDLCALC, TRIG, CHOLHDL, LDLDIRECT in the last 72 hours. Anemia Panel: Recent Labs    06/16/21 0553  FERRITIN 84   Urine analysis:    Component Value Date/Time   COLORURINE YELLOW 06/15/2021 2218   APPEARANCEUR TURBID (A) 06/15/2021 2218   LABSPEC 1.014 06/15/2021 2218   PHURINE 5.0 06/15/2021 2218   GLUCOSEU NEGATIVE 06/15/2021 2218   GLUCOSEU NEGATIVE 04/12/2021 1323   HGBUR SMALL (A) 06/15/2021 2218   BILIRUBINUR NEGATIVE 06/15/2021 2218   KETONESUR 5 (A) 06/15/2021 2218   PROTEINUR 100 (A) 06/15/2021 2218   UROBILINOGEN 0.2 04/12/2021 1323   NITRITE NEGATIVE 06/15/2021 2218   LEUKOCYTESUR MODERATE (A) 06/15/2021 2218   Sepsis Labs: Invalid input(s): PROCALCITONIN, LACTICIDVEN  Microbiology: Recent Results (from the past 240 hour(s))  MRSA PCR Screening     Status: Abnormal   Collection Time: 06/07/21  3:35 PM  Result Value Ref Range Status   MRSA by PCR POSITIVE (A) NEGATIVE Final    Comment:        The GeneXpert MRSA Assay (FDA approved for NASAL specimens only), is one component of a comprehensive MRSA colonization surveillance program. It is not intended to diagnose MRSA infection nor to guide or monitor  treatment for MRSA infections. RESULT CALLED TO, READ BACK BY AND VERIFIED WITH: Brett Albino RN 1724 06/07/21 A BROWNING Performed at [facility], [address], [place], [place] [address]   SARS CORONAVIRUS 2 (TAT 6-24 HRS)     Status: None   Collection Time: 06/07/21  4:19 PM  Result Value Ref Range Status   SARS Coronavirus 2 NEGATIVE NEGATIVE Final    Comment: (NOTE) SARS-CoV-2 target nucleic acids are NOT DETECTED.  The SARS-CoV-2 RNA is generally detectable in upper and lower respiratory specimens during the acute phase of infection. Negative results do not preclude SARS-CoV-2 infection, do not rule out co-infections with other pathogens, and should not be used as the sole basis for treatment or other patient management decisions. Negative results must be combined with clinical observations, patient history, and epidemiological information. The expected result is Negative.  Fact Sheet for Patients: HairSlick.no  Fact Sheet for Healthcare Providers: quierodirigir.com  This test is not yet approved or cleared by the [place] FDA and  has been authorized for detection and/or diagnosis of SARS-CoV-2 by FDA under an Emergency Use Authorization (EUA). This EUA will remain  in effect (meaning this test can be used) for the duration of the COVID-19 declaration under Se ction 564(b)(1) of the Act, 21 U.S.C. section 360bbb-3(b)(1), unless the authorization is terminated or revoked sooner.  Performed at [facility], [address] [place], [place] [address]   Resp Panel by RT-PCR (Flu A&B, Covid) Nasopharyngeal Swab     Status: Abnormal   Collection Time: 06/15/21 10:36  PM   Specimen: Nasopharyngeal Swab; Nasopharyngeal(NP) swabs in vial transport medium  Result Value Ref Range Status   SARS Coronavirus 2 by RT PCR POSITIVE (A) NEGATIVE Final    Comment: RESULT CALLED TO, READ BACK BY AND VERIFIED WITH: RN  EMMANUEL CASTRO BY MESSA H. AT 0110 ON 6 26 2022 (NOTE) SARS-CoV-2 target nucleic acids are DETECTED.  The SARS-CoV-2 RNA is generally detectable in upper respiratory specimens during the acute phase of infection. Positive results are indicative of the presence of the identified virus, but do not rule out bacterial infection or co-infection with other pathogens not detected by the test. Clinical correlation with patient history and other diagnostic information is necessary to determine patient infection status. The expected result is Negative.  Fact Sheet for Patients: BloggerCourse.com  Fact Sheet for Healthcare Providers: SeriousBroker.it  This test is not yet approved or cleared by the [place] FDA and  has been authorized for detection and/or diagnosis of SARS-CoV-2 by FDA under an Emergency Use Authorization (EUA).  This EUA will remain in effect (meanin g this test can be used) for the duration of  the COVID-19 declaration under Section 564(b)(1) of the Act, 21 U.S.C. section 360bbb-3(b)(1), unless the authorization is terminated or revoked sooner.     Influenza A by PCR NEGATIVE NEGATIVE Final   Influenza B by PCR NEGATIVE NEGATIVE Final    Comment: (NOTE) The Xpert Xpress SARS-CoV-2/FLU/RSV plus assay is intended as an aid in the diagnosis of influenza from Nasopharyngeal swab specimens and should not be used as a sole basis for treatment. Nasal washings and aspirates are unacceptable for Xpert Xpress SARS-CoV-2/FLU/RSV testing.  Fact Sheet for Patients: BloggerCourse.com  Fact Sheet for Healthcare Providers: SeriousBroker.it  This test is not yet approved or cleared by the [place] FDA and has been authorized for detection and/or diagnosis of SARS-CoV-2 by FDA under an Emergency Use Authorization (EUA). This EUA will remain in effect (meaning this test can  be used) for the duration of the COVID-19 declaration under Section 564(b)(1) of the Act, 21 U.S.C. section 360bbb-3(b)(1), unless the authorization is terminated or revoked.  Performed at [facility], [address] [place], [place] [address]     Radiology Studies: DG Ankle Complete Right  Result Date: 06/15/2021 CLINICAL DATA:  Infection/injury to foot. EXAM: RIGHT ANKLE - COMPLETE 3+ VIEW.  Patient unable to straighten foot. COMPARISON:  None. FINDINGS: No cortical erosion or destruction. There is no evidence of fracture, dislocation, or joint effusion. There is no evidence of arthropathy or other focal bone abnormality. Soft tissues are unremarkable. IMPRESSION: 1. No radiographic evidence of osteomyelitis. 2. No acute displaced fracture or dislocation. 3. Limited evaluation of the right ankle due to positioning. Electronically Signed   By: Tish Frederickson M.D.   On: 06/15/2021 19:42   CT Head Wo Contrast  Result Date: 06/15/2021 CLINICAL DATA:  Mental status change.  Unknown cause. EXAM: CT HEAD WITHOUT CONTRAST TECHNIQUE: Contiguous axial images were obtained from the base of the skull through the vertex without intravenous contrast. COMPARISON:  CT head 09/09/2020 FINDINGS: Brain: Cerebral ventricle sizes are concordant with the degree of cerebral volume loss. Patchy and confluent areas of decreased attenuation are noted throughout the deep and periventricular white matter of the cerebral hemispheres bilaterally, compatible with chronic microvascular ischemic disease. No evidence of large-territorial acute infarction. No parenchymal hemorrhage. No mass lesion. No acute extra-axial collection. Similar-appearing right subdural hygroma. No mass effect or midline shift. No hydrocephalus. Basilar  cisterns are patent. Redemonstration of a partial empty sella. Vascular: No hyperdense vessel. Skull: No acute fracture or focal lesion. Left parietal soft skull surgical hardware. Sinuses/Orbits:  Mucosal thickening of all the paranasal sinuses. The mastoid air cells are clear. The orbits are unremarkable. Other: None. IMPRESSION: 1. No acute intracranial abnormality. 2. Pansinusitis. Electronically Signed   By: Tish Frederickson M.D.   On: 06/15/2021 19:45   Portable chest 1 View  Result Date: 06/16/2021 CLINICAL DATA:  Initial evaluation for acute shortness of breath. EXAM: PORTABLE CHEST 1 VIEW COMPARISON:  Prior radiograph from 09/10/2020. FINDINGS: Patient is markedly rotated to the right. Median sternotomy wires underlying CABG markers noted. Cardiomegaly grossly stable allowing for rotation. Mediastinal silhouette within normal limits. Lungs hypoinflated. Mildly increased pulmonary vascular and interstitial prominence, suggesting pulmonary interstitial congestion. Increased patchy opacity at the right lung base, which could reflect atelectasis or infiltrate. Veiling opacity overlying the right lung base suggestive of an associated small pleural effusion. No pneumothorax. Osseous structures unchanged. IMPRESSION: 1. Increased patchy opacities at the right lung base, which could reflect atelectasis or infiltrate. Superimposed veiling opacity suggestive of a small pleural effusion. 2. Cardiomegaly with underlying mild diffuse pulmonary interstitial congestion. Electronically Signed   By: Rise Mu M.D.   On: 06/16/2021 03:47      Marae Cottrell T. Havoc [facility]  If 7PM-7AM, please contact night-coverage [email or web address] 06/16/2021, 11:33 AM

## 2021-06-16 NOTE — Evaluation (Signed)
Physical Therapy Evaluation Patient Details Name: Lonnie [name] MRN: [id number] DOB: Mar 07, 1951 Today's Date: 06/16/2021   History of Present Illness  Lonnie [name] is a 70 y.o. male  who presented to [facility] ED from home due to confusion of 1 day duration, Associated with urinary retention and generalized weakness.  Workup revealing multifactorial AKI, leukocytosis, presumptive UTI, also +Covid PCR;  Patient was recently discharged from the hospital on 06/07/2021 after peripheral vascular thrombectomy of R LE, was dc'd home 06/14/2021 (family had refused SNF for rehab);  with medical history significant for dementia, peripheral artery disease, tobacco use disorder, chronic depression/anxiety, iron deficiency anemia, peripheral neuropathy, essential hypertension, hyperlipidemia, GERD, BPH  Clinical Impression   Pt admitted with above diagnosis. Pt was able to mobilize with therapists on last admission, however unable to do so today. Progressed to EOB with S however declined to stand due to R foot pain. Continue to recommend SNF for rehab, however if pt declines SNF will need 24/7 assistance and DC home most likely at wc level. Pt prefers to DC home, however due to his cognitive deficits, will need to confirm that family has caregivers available to help with necessary level of care. Pt currently with functional limitations due to the deficits listed below (see PT Problem List). Pt will benefit from skilled PT to increase their independence and safety with mobility to allow discharge to the venue listed below.       Follow Up Recommendations SNF;Supervision/Assistance - 24 hour;Other (comment) (Still would be better served by rehab before home, but if refuses SNF needs HHPT initial 24 hour assist for all mobility)    Equipment Recommendations  None recommended by PT;Other (comment) (BSC if family does not have one, per chart review they have RW, tub bench, ramp and manual wheelchair)     Recommendations for Other Services OT consult (as ordered)     Precautions / Restrictions Precautions Precautions: Fall Precaution Comments: very painful R foot Restrictions Weight Bearing Restrictions: No      Mobility  Bed Mobility Overal bed mobility: Needs Assistance Bed Mobility: Supine to Sit;Sit to Supine     Supine to sit: Supervision;HOB elevated Sit to supine: Supervision;HOB elevated   General bed mobility comments: Slow moving, and required cues to complete the task of sitting up and getting at least L foot to the floor    Transfers                 General transfer comment: pt unable to stand - limited by pain  Ambulation/Gait                Stairs            Wheelchair Mobility    Modified Rankin (Stroke Patients Only)       Balance     Sitting balance-Leahy Scale: Fair                                       Pertinent Vitals/Pain Pain Assessment: 0-10 Pain Score: 10-Worst pain ever Pain Location: R foot with activity Pain Descriptors / Indicators: Discomfort;Grimacing;Burning Pain Intervention(s): Monitored during session;Limited activity within patient's tolerance    Home Living Family/patient expects to be discharged to:: Private residence Living Arrangements: Spouse/significant other Available Help at Discharge: Family;Personal care attendant;Available PRN/intermittently Type of Home: House Home Access: Ramped entrance     Home Layout: Two level;Able to live  on main level with bedroom/bathroom Home Equipment: Walker - 2 wheels;Tub bench;Hand held shower head;Wheelchair - manual      Prior Function Level of Independence: Needs assistance   Gait / Transfers Assistance Needed: able to mobilize with RW in the home  ADL's / Homemaking Assistance Needed: PCA assists with shower transfers, bathing and LB dressing tasks. pt reports difficulty reaching B feet        Hand Dominance   Dominant Hand:  Right    Extremity/Trunk Assessment   Upper Extremity Assessment Upper Extremity Assessment: Generalized weakness    Lower Extremity Assessment Lower Extremity Assessment: Generalized weakness;RLE deficits/detail RLE Deficits / Details: Wound dorsum of foot; Very painful with any motion; adequate hip and knee ROM for sitting EOB; unable to tolerate R foot touching floor    Cervical / Trunk Assessment Cervical / Trunk Assessment: Kyphotic  Communication   Communication: No difficulties  Cognition Arousal/Alertness: Awake/alert Behavior During Therapy: WFL for tasks assessed/performed Overall Cognitive Status: No family/caregiver present to determine baseline cognitive functioning Area of Impairment: Orientation;Attention;Memory;Following commands;Safety/judgement;Awareness                 Orientation Level: Disoriented to;Time;Situation Current Attention Level: Sustained Memory: Decreased short-term memory Following Commands: Follows one step commands with increased time Safety/Judgement: Decreased awareness of safety;Decreased awareness of deficits   Problem Solving: Slow processing;Decreased initiation;Requires verbal cues;Difficulty sequencing;Requires tactile cues        General Comments General comments (skin integrity, edema, etc.): Session conducted on Room air and O2 sats remained greater tahn or equal to 93%    Exercises     Assessment/Plan    PT Assessment Patient needs continued PT services  PT Problem List Decreased strength;Decreased activity tolerance;Decreased mobility;Decreased knowledge of use of DME;Pain       PT Treatment Interventions DME instruction;Gait training;Functional mobility training;Stair training;Therapeutic activities;Patient/family education    PT Goals (Current goals can be found in the Care Plan section)  Acute Rehab PT Goals Patient Stated Goal: to go back home PT Goal Formulation: Patient unable to participate in goal  setting Time For Goal Achievement: 06/30/21 Potential to Achieve Goals: Fair    Frequency Min 3X/week   Barriers to discharge   noted last admission family declined SNF to bring pt home; he is functioanlly appropriate for dc to SNF; but with a diagnosis of dementia, understandable if family chooses to bring pt home to familiar environment, caregivers, and routines; Will need to know if he has 24 hour reliable assistance    Co-evaluation               AM-PAC PT "6 Clicks" Mobility  Outcome Measure Help needed turning from your back to your side while in a flat bed without using bedrails?: None Help needed moving from lying on your back to sitting on the side of a flat bed without using bedrails?: None Help needed moving to and from a bed to a chair (including a wheelchair)?: A Lot Help needed standing up from a chair using your arms (e.g., wheelchair or bedside chair)?: A Lot Help needed to walk in hospital room?: A Lot Help needed climbing 3-5 steps with a railing? : Total 6 Click Score: 15    End of Session   Activity Tolerance: Patient limited by lethargy;Patient tolerated treatment well Patient left: in bed;with call bell/phone within reach;with bed alarm set Nurse Communication: Mobility status PT Visit Diagnosis: Other abnormalities of gait and mobility (R26.89);Pain;Difficulty in walking, not elsewhere classified (R26.2) Pain -  Right/Left: Right Pain - part of body: Leg    Time: 1241-1320 PT Time Calculation (min) (ACUTE ONLY): 39 min   Charges:   PT Evaluation $PT Eval Moderate Complexity: 1 Mod PT Treatments $Therapeutic Activity: 23-37 mins        Van Clines, PT  Acute Rehabilitation Services Pager [phone] Office [phone]   Levi Aland 06/16/2021, 5:56 PM

## 2021-06-16 NOTE — Progress Notes (Signed)
TRH night shift.  The nursing staff reported that the patient's SARS 2 coronavirus PCR test was positive.  COVID order set activated.  Pertinent labs, imaging and treatment ordered.  Sanda Klein, MD.

## 2021-06-16 NOTE — Progress Notes (Addendum)
Occupational Therapy Evaluation  Pt home 1 day prior to returning to hospital. Pt was able to mobilize with therapists on last admission, however unable to do so today. Progressed to EOB with S however declined to stand due to R foot pain. Continue to recommend SNF for rehab, however if pt declines SNF will need 24/7 assistance and DC home most likely at wc level. Pt prefers to DC home, however due to his cognitive deficits, will need to confirm that family has caregivers available to help with necessary level of care. Will continue to follow acutely.     06/16/21 0800  OT Visit Information  Last OT Received On 06/16/21  Assistance Needed +1  History of Present Illness YANG RACK is a 70 y.o. male  who presented to [facility] ED from home due to confusion of 1 day duration, Associated with urinary retention and generalized weakness.  Workup revealing multifactorial AKI, leukocytosis, presumptive UTI, also +Covid PCR;  Patient was recently discharged from the hospital on 06/07/2021 after peripheral vascular thrombectomy of R LE, was dc'd home 06/14/2021 (family had refused SNF for rehab);  with medical history significant for dementia, peripheral artery disease, tobacco use disorder, chronic depression/anxiety, iron deficiency anemia, peripheral neuropathy, essential hypertension, hyperlipidemia, GERD, BPH  Precautions  Precautions Fall  Precaution Comments very painful R foot  Home Living  Family/patient expects to be discharged to: Private residence  Living Arrangements Spouse/significant other  Available Help at Discharge Family;Personal care attendant;Available PRN/intermittently  Type of Home House  Home Access Ramped entrance  Home Layout Two level;Able to live on main level with bedroom/bathroom  Bathroom Publishing copy  Prior Function  Level of Independence Needs assistance  Gait / Transfers Assistance Needed able to mobilize with RW in the home   ADL's / Homemaking Assistance Needed PCA assists with shower transfers, bathing and LB dressing tasks. pt reports difficulty reaching B feet  Pain Assessment  Pain Assessment Faces  Faces Pain Scale 8  Pain Location R foot with activity  Pain Descriptors / Indicators Discomfort;Grimacing;Burning  Pain Intervention(s) Limited activity within patient's tolerance  Cognition  Arousal/Alertness Awake/alert  Behavior During Therapy Flat affect  Overall Cognitive Status No family/caregiver present to determine baseline cognitive functioning  Area of Impairment Orientation;Attention;Memory;Following commands;Safety/judgement;Awareness  Orientation Level Disoriented to;Time;Situation  Current Attention Level Sustained  Memory Decreased short-term memory  Following Commands Follows one step commands with increased time  Safety/Judgement Decreased awareness of safety;Decreased awareness of deficits  Awareness Intellectual  Problem Solving Slow processing;Decreased initiation;Requires verbal cues;Difficulty sequencing;Requires tactile cues; thinks it is 1994 or 2014  Upper Extremity Assessment  Upper Extremity Assessment Generalized weakness  Lower Extremity Assessment  Lower Extremity Assessment Defer to PT evaluation  Cervical / Trunk Assessment  Cervical / Trunk Assessment Kyphotic  ADL  Overall ADL's  Needs assistance/impaired  Grooming Set up;Sitting  Upper Body Bathing Set up;Supervision/ safety;Sitting  Lower Body Bathing Maximal assistance;Bed level  Upper Body Dressing  Minimal assistance;Sitting;Bed level  Lower Body Dressing Maximal assistance;Bed level  Functional mobility during ADLs  (unable to tolerate)  Bed Mobility  Overal bed mobility Needs Assistance  Bed Mobility Supine to Sit;Sit to Supine  Supine to sit Supervision;HOB elevated  Sit to supine Supervision;HOB elevated  Transfers  General transfer comment pt unable to stand - limited by pain  OT - End of Session   Activity Tolerance Patient limited by pain  Patient left in bed;with call bell/phone within reach;with bed alarm set (requesting  all rails up)  Nurse Communication Mobility status  OT Assessment  OT Recommendation/Assessment Patient needs continued OT Services  OT Visit Diagnosis Unsteadiness on feet (R26.81);Other abnormalities of gait and mobility (R26.89);Muscle weakness (generalized) (M62.81);Pain;Other symptoms and signs involving cognitive function  Pain - Right/Left Right  Pain - part of body Leg  OT Problem List Decreased strength;Decreased range of motion;Decreased activity tolerance;Impaired balance (sitting and/or standing);Decreased cognition;Decreased safety awareness;Decreased knowledge of use of DME or AE;Pain;Increased edema  OT Plan  OT Frequency (ACUTE ONLY) Min 2X/week  OT Treatment/Interventions (ACUTE ONLY) Self-care/ADL training;Therapeutic exercise;Energy conservation;DME and/or AE instruction;Therapeutic activities;Patient/family education;Balance training;Cognitive remediation/compensation  AM-PAC OT "6 Clicks" Daily Activity Outcome Measure (Version 2)  Help from another person eating meals? 4  Help from another person taking care of personal grooming? 3  Help from another person toileting, which includes using toliet, bedpan, or urinal? 2  Help from another person bathing (including washing, rinsing, drying)? 2  Help from another person to put on and taking off regular upper body clothing? 3  Help from another person to put on and taking off regular lower body clothing? 2  6 Click Score 16  OT Recommendation  Follow Up Recommendations Supervision/Assistance - 24 hour;SNF  OT Equipment  (pt reports he has a wc)  Individuals Consulted  Consulted and Agree with Results and Recommendations Patient unable/family or caregiver not available  Acute Rehab OT Goals  Patient Stated Goal to go back home  OT Goal Formulation Patient unable to participate in goal setting   Time For Goal Achievement 06/30/21  Potential to Achieve Goals Good  OT Time Calculation  OT Start Time (ACUTE ONLY) 1353  OT Stop Time (ACUTE ONLY) 1414  OT Time Calculation (min) 21 min  OT General Charges  $OT Visit 1 Visit  OT Evaluation  $OT Eval Moderate Complexity 1 Mod  Written Expression  Dominant Hand Right  Luisa Dago, OT/L   Acute OT Clinical Specialist Acute Rehabilitation Services Pager [phone] Office [phone]

## 2021-06-16 NOTE — Consult Note (Signed)
WOC Nurse Consult Note: Reason for Consult: Patient discharged from hospital on 06/14/21.  Readmitted on 06/15/21.  Dr. Myra Gianotti, Vascular SUrgery saw the patient post procedure and provided orders on 06/14/21.  I have transcribed those orders for daily care of the right dorsal foot into Nursing Orders, specifically xeroform gauze as a wound contact layer, topper dressing and secure with a light ACE bandage.   Recommend consulting Vascular Surgery/Dr. Myra Gianotti to notify of patient readmission.  WOC nursing team will not follow, but will remain available to this patient, the nursing and medical teams.  Please re-consult if needed. Thanks, Ladona Mow, MSN, RN, GNP, [place]  Pager# [phone]

## 2021-06-16 NOTE — Progress Notes (Signed)
    Patient readmitted with leukocytosis and confusion.  Evaluated his wound which does not seem significantly changed from pictures of most recent admission.  He is does have some increased edema of the right lower extremity which is most likely post revascularization however given new diagnosis of COVID I will check a right lower extremity venous duplex to rule out DVT.  I will notify Dr. Myra Gianotti of his admission.  Rosella Crandell C. Randie Heinz, MD [facility] Office: [phone] Pager: [phone]

## 2021-06-16 NOTE — Progress Notes (Signed)
Pharmacy Antibiotic Note  Lonnie [name] is a 70 y.o. male admitted on 06/15/2021 with wound infection  Pharmacy has been consulted for vancomycin dosing. Patient previously started on linezolid. MD now wishes to start vancomycin. AKI on admission has resolved and patient is back to baseline  Plan: Vancomycin 1750mg  IV x 1, then 1000mg  IV q12h for estimated AUC of 437.8 using Scr 0.97 D/C linezolid  Height: 6\' 2"  (188 cm) Weight: 84.9 kg (187 lb 2.7 oz) IBW/kg (Calculated) : 82.2  Temp (24hrs), Avg:99.1 F (37.3 C), Min:98.4 F (36.9 C), Max:99.7 F (37.6 C)  Recent Labs  Lab 06/09/21 1055 06/10/21 0249 06/15/21 1809 06/16/21 0553  WBC 12.1* 18.7* 21.1* 17.0*  CREATININE  --  0.91 1.49* 0.97  LATICACIDVEN  --   --   --  1.5    Estimated Creatinine Clearance: 84.7 mL/min (by C-G formula based on SCr of 0.97 mg/dL).    Allergies  Allergen Reactions   Ace Inhibitors Swelling and Other (See Comments)    Angioedema   Eggs Or Egg-Derived Products Nausea Only and Other (See Comments)    Cannot eat prepared eggs- Occasional nausea   Lactose Intolerance (Gi) Diarrhea, Nausea Only and Other (See Comments)    Flatulence, also   Latex Itching    Antimicrobials this admission: 6/25 Linezolid >> 6/26 6/26 Vancomycin >>  6/25 Ceftriaxone >>  Dose adjustments this admission: N/a  Microbiology results: 6/26 BCx: NGTD 6/25 UCx: Sent  6/25 MRSA PCR: Sent  Lonnie [name] A. 7/26, PharmD, BCPS, FNKF Clinical Pharmacist [place] Please utilize Amion for appropriate phone number to reach the unit pharmacist [facility])  06/16/2021 8:14 AM

## 2021-06-16 NOTE — Plan of Care (Signed)
  Problem: Clinical Measurements: Goal: Diagnostic test results will improve Outcome: Completed/Met

## 2021-06-16 NOTE — Progress Notes (Signed)
New Admission Note:  Arrival Method: Stretcher Mental Orientation: Alert and oriented x 2-3 Telemetry: Box 04 Assessment: Completed Skin: Warm and dry. BLE edema RT Ant. foot with ulcer black eschar.  IV: NSL Pain: Denies  Tubes: Foley Cath  Safety Measures: Safety Fall Prevention Plan initiated.  Admission: Completed 5 M  Orientation: Patient has been orientated to the room, unit and the staff. Welcome booklet given.  Family: N/A  Orders have been reviewed and implemented. Will continue to monitor the patient. Call light has been placed within reach and bed alarm has been activated.   Guilford Shi BSN, RN  Phone Number: [phone]

## 2021-06-17 ENCOUNTER — Inpatient Hospital Stay (HOSPITAL_COMMUNITY): Payer: Medicare Other

## 2021-06-17 ENCOUNTER — Telehealth: Payer: Self-pay

## 2021-06-17 ENCOUNTER — Encounter (HOSPITAL_COMMUNITY): Payer: Medicare Other

## 2021-06-17 DIAGNOSIS — U071 COVID-19: Secondary | ICD-10-CM

## 2021-06-17 DIAGNOSIS — G9341 Metabolic encephalopathy: Secondary | ICD-10-CM | POA: Diagnosis not present

## 2021-06-17 DIAGNOSIS — M7989 Other specified soft tissue disorders: Secondary | ICD-10-CM

## 2021-06-17 DIAGNOSIS — J1282 Pneumonia due to coronavirus disease 2019: Secondary | ICD-10-CM

## 2021-06-17 DIAGNOSIS — I739 Peripheral vascular disease, unspecified: Secondary | ICD-10-CM

## 2021-06-17 DIAGNOSIS — R7989 Other specified abnormal findings of blood chemistry: Secondary | ICD-10-CM

## 2021-06-17 DIAGNOSIS — F17200 Nicotine dependence, unspecified, uncomplicated: Secondary | ICD-10-CM

## 2021-06-17 DIAGNOSIS — L97519 Non-pressure chronic ulcer of other part of right foot with unspecified severity: Secondary | ICD-10-CM

## 2021-06-17 DIAGNOSIS — I5042 Chronic combined systolic (congestive) and diastolic (congestive) heart failure: Secondary | ICD-10-CM

## 2021-06-17 DIAGNOSIS — T83511A Infection and inflammatory reaction due to indwelling urethral catheter, initial encounter: Secondary | ICD-10-CM

## 2021-06-17 DIAGNOSIS — N390 Urinary tract infection, site not specified: Secondary | ICD-10-CM

## 2021-06-17 DIAGNOSIS — K5900 Constipation, unspecified: Secondary | ICD-10-CM | POA: Diagnosis not present

## 2021-06-17 DIAGNOSIS — N179 Acute kidney failure, unspecified: Secondary | ICD-10-CM | POA: Diagnosis not present

## 2021-06-17 DIAGNOSIS — D72829 Elevated white blood cell count, unspecified: Secondary | ICD-10-CM | POA: Diagnosis not present

## 2021-06-17 DIAGNOSIS — R338 Other retention of urine: Secondary | ICD-10-CM

## 2021-06-17 DIAGNOSIS — D509 Iron deficiency anemia, unspecified: Secondary | ICD-10-CM

## 2021-06-17 DIAGNOSIS — F32A Depression, unspecified: Secondary | ICD-10-CM

## 2021-06-17 DIAGNOSIS — R748 Abnormal levels of other serum enzymes: Secondary | ICD-10-CM

## 2021-06-17 DIAGNOSIS — Z9889 Other specified postprocedural states: Secondary | ICD-10-CM

## 2021-06-17 DIAGNOSIS — F419 Anxiety disorder, unspecified: Secondary | ICD-10-CM

## 2021-06-17 DIAGNOSIS — R531 Weakness: Secondary | ICD-10-CM

## 2021-06-17 LAB — CBC WITH DIFFERENTIAL/PLATELET
Abs Immature Granulocytes: 0.41 10*3/uL — ABNORMAL HIGH (ref 0.00–0.07)
Basophils Absolute: 0 10*3/uL (ref 0.0–0.1)
Basophils Relative: 0 %
Eosinophils Absolute: 0 10*3/uL (ref 0.0–0.5)
Eosinophils Relative: 0 %
HCT: 23.1 % — ABNORMAL LOW (ref 39.0–52.0)
Hemoglobin: 7.1 g/dL — ABNORMAL LOW (ref 13.0–17.0)
Immature Granulocytes: 2 %
Lymphocytes Relative: 10 %
Lymphs Abs: 2 10*3/uL (ref 0.7–4.0)
MCH: 21.8 pg — ABNORMAL LOW (ref 26.0–34.0)
MCHC: 30.7 g/dL (ref 30.0–36.0)
MCV: 71.1 fL — ABNORMAL LOW (ref 80.0–100.0)
Monocytes Absolute: 1.1 10*3/uL — ABNORMAL HIGH (ref 0.1–1.0)
Monocytes Relative: 6 %
Neutro Abs: 16.1 10*3/uL — ABNORMAL HIGH (ref 1.7–7.7)
Neutrophils Relative %: 82 %
Platelets: 510 10*3/uL — ABNORMAL HIGH (ref 150–400)
RBC: 3.25 MIL/uL — ABNORMAL LOW (ref 4.22–5.81)
RDW: 19.8 % — ABNORMAL HIGH (ref 11.5–15.5)
WBC: 19.7 10*3/uL — ABNORMAL HIGH (ref 4.0–10.5)
nRBC: 0 % (ref 0.0–0.2)

## 2021-06-17 LAB — COMPREHENSIVE METABOLIC PANEL
ALT: 146 U/L — ABNORMAL HIGH (ref 0–44)
AST: 152 U/L — ABNORMAL HIGH (ref 15–41)
Albumin: 2.2 g/dL — ABNORMAL LOW (ref 3.5–5.0)
Alkaline Phosphatase: 126 U/L (ref 38–126)
Anion gap: 10 (ref 5–15)
BUN: 16 mg/dL (ref 8–23)
CO2: 20 mmol/L — ABNORMAL LOW (ref 22–32)
Calcium: 8.1 mg/dL — ABNORMAL LOW (ref 8.9–10.3)
Chloride: 105 mmol/L (ref 98–111)
Creatinine, Ser: 0.87 mg/dL (ref 0.61–1.24)
GFR, Estimated: >60 mL/min (ref >60)
Glucose, Bld: 119 mg/dL — ABNORMAL HIGH (ref 70–99)
Potassium: 4 mmol/L (ref 3.5–5.1)
Sodium: 135 mmol/L (ref 135–145)
Total Bilirubin: 0.4 mg/dL (ref 0.3–1.2)
Total Protein: 6.3 g/dL — ABNORMAL LOW (ref 6.5–8.1)

## 2021-06-17 LAB — CBC
HCT: 25.8 % — ABNORMAL LOW (ref 39.0–52.0)
Hemoglobin: 8 g/dL — ABNORMAL LOW (ref 13.0–17.0)
MCH: 22.2 pg — ABNORMAL LOW (ref 26.0–34.0)
MCHC: 31 g/dL (ref 30.0–36.0)
MCV: 71.7 fL — ABNORMAL LOW (ref 80.0–100.0)
Platelets: 572 10*3/uL — ABNORMAL HIGH (ref 150–400)
RBC: 3.6 MIL/uL — ABNORMAL LOW (ref 4.22–5.81)
RDW: 19.7 % — ABNORMAL HIGH (ref 11.5–15.5)
WBC: 20.9 10*3/uL — ABNORMAL HIGH (ref 4.0–10.5)
nRBC: 0 % (ref 0.0–0.2)

## 2021-06-17 LAB — FERRITIN: Ferritin: 83 ng/mL (ref 24–336)

## 2021-06-17 LAB — MAGNESIUM: Magnesium: 2.1 mg/dL (ref 1.7–2.4)

## 2021-06-17 LAB — C-REACTIVE PROTEIN: CRP: 21.4 mg/dL — ABNORMAL HIGH (ref <1.0)

## 2021-06-17 LAB — PHOSPHORUS: Phosphorus: 2.3 mg/dL — ABNORMAL LOW (ref 2.5–4.6)

## 2021-06-17 LAB — D-DIMER, QUANTITATIVE: D-Dimer, Quant: 1.88 ug/mL-FEU — ABNORMAL HIGH (ref 0.00–0.50)

## 2021-06-17 MED ORDER — BETHANECHOL CHLORIDE 10 MG PO TABS
10.0000 mg | ORAL_TABLET | Freq: Three times a day (TID) | ORAL | Status: DC
  Administered 2021-06-18 – 2021-07-12 (×74): 10 mg via ORAL
  Filled 2021-06-17 (×79): qty 1

## 2021-06-17 MED ORDER — CITALOPRAM HYDROBROMIDE 20 MG PO TABS
20.0000 mg | ORAL_TABLET | Freq: Every day | ORAL | Status: DC
  Administered 2021-06-18 – 2021-07-12 (×25): 20 mg via ORAL
  Filled 2021-06-17 (×26): qty 1

## 2021-06-17 MED ORDER — POLYETHYLENE GLYCOL 3350 17 G PO PACK: 17.0000 g | PACK | Freq: Two times a day (BID) | ORAL | Status: DC | PRN

## 2021-06-17 MED ORDER — SODIUM CHLORIDE 0.9 % IV SOLN
2.0000 g | Freq: Two times a day (BID) | INTRAVENOUS | Status: AC
  Administered 2021-06-17 – 2021-06-19 (×4): 2 g via INTRAVENOUS
  Filled 2021-06-17 (×4): qty 2

## 2021-06-17 MED ORDER — BETHANECHOL CHLORIDE 25 MG PO TABS
25.0000 mg | ORAL_TABLET | Freq: Once | ORAL | Status: AC
  Administered 2021-06-17: 25 mg via ORAL
  Filled 2021-06-17: qty 1

## 2021-06-17 MED ORDER — SENNOSIDES-DOCUSATE SODIUM 8.6-50 MG PO TABS
1.0000 | ORAL_TABLET | Freq: Two times a day (BID) | ORAL | Status: DC
  Administered 2021-06-17 – 2021-07-12 (×49): 1 via ORAL
  Filled 2021-06-17 (×49): qty 1

## 2021-06-17 NOTE — Consult Note (Signed)
[facility]       Reason for Consult:  Right foot wound   Referring Physician: Dr. Alanda Slim  Active Problems:   Acute metabolic encephalopathy   AKI (acute kidney injury) (HCC)    aspirin EC  81 mg Oral Daily   atorvastatin  80 mg Oral QPM   Chlorhexidine Gluconate Cloth  6 each Topical Daily   citalopram  40 mg Oral Daily   clopidogrel  75 mg Oral Daily   dexamethasone (DECADRON) injection  6 mg Intravenous Q24H   enoxaparin (LOVENOX) injection  40 mg Subcutaneous Q24H   ferrous fumarate-b12-vitamic C-folic acid  1 capsule Oral Daily   furosemide  20 mg Oral Daily   gabapentin  100 mg Oral BID   melatonin  10 mg Oral QPM   metoprolol tartrate  12.5 mg Oral BID   multivitamin with minerals  1 tablet Oral Daily   nicotine  7 mg Transdermal Daily   pantoprazole  40 mg Oral QPM   QUEtiapine  200 mg Oral BID   senna-docusate  1 tablet Oral BID   sodium chloride flush  3 mL Intravenous Once   tamsulosin  0.8 mg Oral QPM    Recommendations: Stop vancomycin Start cefepime  Assessment: Pictures examined, leg examined around wrap.  He has a foot wound with poor blood flow but no active signs of infection and no indication for antibiotics.   Positive COVID-19.  Asymptomatic.  On Remdesivir and would treat for 3 days.   Confusion - seems resolved at this time.  ?UTI - significant urinary retention, WBCs in urine.  No symptoms at this time.  No fever, WBC similar to previous levels.  Recent Pseudomonas in urine. I will use cefepime for a short period, pending urine culture.     Antibiotics: Vancomycin, ceftriaxone, remdesivir  HPI: Lonnie [name] is a 70 y.o. male with a history of dementia, PAD, continued tobacco use who came in to the ED with confusion.  He was found to have significant urinary retention of about 900cc and foley placed.  He was started on ceftriaxone with concern for urinary infection and admitted.  He has a known chronic foot  wound and recently underwent mechanical thrombectomy of his right femoral-popliteal bypass graft and stent of his grafts done x 2 and was discharged to a rehab center.  He had been found on 06/06/21 office visit to have an occluded right femoral to BK bypass.  He had a known wound at that time.  He was home one day from the rehab center and found to be altered.  Wound was evaluated by vascular surgery and no significant changes noted. He was found to be positive for COVID-19 as well.  CXR was found to have right lung opacities, atelectasis vs infiltrate.  Also with cardiomegaly and interstitial congestion.    Review of Systems:  Constitutional: negative for fevers and chills Respiratory: negative for cough, hemoptysis, or pneumonia All other systems reviewed and are negative    Past Medical History:  Diagnosis Date   Allergy    Anxiety    Arthritis    left neck, shoulder, knee   CAD (coronary artery disease)    a. anterior STEMI 10/2013 s/p 4V CABG with LIMA to mid LAD, SVG to OM, SVG to PDA, SVG to Diagonal.   Chronic combined systolic and diastolic CHF (congestive heart failure) (HCC)    Dementia (HCC)    Depression    Falls  Hypertension    Ischemic cardiomyopathy    a. EF 40-45% at time of CABG and in 2018.   MI (myocardial infarction) (HCC)    Peripheral neuropathy    Prediabetes 09/01/2014   PVD (peripheral vascular disease) (HCC)    a. s/p L SFA stents with now known bilateral SFA. b. right femoral to below the knee bypass by Dr. Myra Gianotti in 12/2019, c/b infection 02/2020.   Stroke [facility])    seen on CT Scan   Subdural hematoma (HCC)    Tobacco abuse    UTI (urinary tract infection)     Social History   Tobacco Use   Smoking status: Every Day    Packs/day: 0.25    Years: 30.00    Pack years: 7.50    Types: Cigarettes   Smokeless tobacco: Never   Tobacco comments:    < 1/2 ppd  Vaping Use   Vaping Use: Never used  Substance Use Topics   Alcohol use: No     Alcohol/week: 0.0 standard drinks   Drug use: No    Family History  Problem Relation Age of Onset   CAD Father 46   AAA (abdominal aortic aneurysm) Father    Alcohol abuse Father    Hypertension Father    Diabetes Father    Heart attack Father    Stroke Mother    Arthritis Mother    Heart disease Mother    Hypertension Mother    Heart attack Mother    Cardiomyopathy Daughter    Colon cancer Neg Hx    Prostate cancer Neg Hx     Allergies  Allergen Reactions   Ace Inhibitors Swelling and Other (See Comments)    Angioedema   Eggs Or Egg-Derived Products Nausea Only and Other (See Comments)    Cannot eat prepared eggs- Occasional nausea   Lactose Intolerance (Gi) Diarrhea, Nausea Only and Other (See Comments)    Flatulence, also   Latex Itching    Physical Exam: Constitutional: in no apparent distress  Vitals:   06/16/21 2130 06/17/21 0615  BP: 130/61 127/66  Pulse: 61 69  Resp: 16 17  Temp: 98.4 F (36.9 C) 98.3 F (36.8 C)  SpO2: 97% 98%   EYES: anicteric ENMT: anicteric Cardiovascular: Cor RRR Respiratory: clear; GI: Bowel sounds are normal, liver is not enlarged, spleen is not enlarged Musculoskeletal: right leg wrapped; no erythema where examined Skin: negatives: no rash Neuro: non-focal  Lab Results  Component Value Date   WBC 19.7 (H) 06/17/2021   HGB 7.1 (L) 06/17/2021   HCT 23.1 (L) 06/17/2021   MCV 71.1 (L) 06/17/2021   PLT 510 (H) 06/17/2021    Lab Results  Component Value Date   CREATININE 0.87 06/17/2021   BUN 16 06/17/2021   NA 135 06/17/2021   K 4.0 06/17/2021   CL 105 06/17/2021   CO2 20 (L) 06/17/2021    Lab Results  Component Value Date   ALT 146 (H) 06/17/2021   AST 152 (H) 06/17/2021   ALKPHOS 126 06/17/2021     Microbiology: Recent Results (from the past 240 hour(s))  MRSA PCR Screening     Status: Abnormal   Collection Time: 06/07/21  3:35 PM  Result Value Ref Range Status   MRSA by PCR POSITIVE (A) NEGATIVE Final     Comment:        The GeneXpert MRSA Assay (FDA approved for NASAL specimens only), is one component of a comprehensive MRSA colonization surveillance program. It is  not intended to diagnose MRSA infection nor to guide or monitor treatment for MRSA infections. RESULT CALLED TO, READ BACK BY AND VERIFIED WITH: Brett AlbinoK DALISAY RN 1724 06/07/21 A BROWNING Performed at [facility], [address] [place], [place] [address]   SARS CORONAVIRUS 2 (TAT 6-24 HRS)     Status: None   Collection Time: 06/07/21  4:19 PM  Result Value Ref Range Status   SARS Coronavirus 2 NEGATIVE NEGATIVE Final    Comment: (NOTE) SARS-CoV-2 target nucleic acids are NOT DETECTED.  The SARS-CoV-2 RNA is generally detectable in upper and lower respiratory specimens during the acute phase of infection. Negative results do not preclude SARS-CoV-2 infection, do not rule out co-infections with other pathogens, and should not be used as the sole basis for treatment or other patient management decisions. Negative results must be combined with clinical observations, patient history, and epidemiological information. The expected result is Negative.  Fact Sheet for Patients: HairSlick.nohttps://www.fda.gov/media/138098/download  Fact Sheet for Healthcare Providers: quierodirigir.comhttps://www.fda.gov/media/138095/download  This test is not yet approved or cleared by the [place] FDA and  has been authorized for detection and/or diagnosis of SARS-CoV-2 by FDA under an Emergency Use Authorization (EUA). This EUA will remain  in effect (meaning this test can be used) for the duration of the COVID-19 declaration under Se ction 564(b)(1) of the Act, 21 U.S.C. section 360bbb-3(b)(1), unless the authorization is terminated or revoked sooner.  Performed at [facility], [address] [place], [place] [address]   MRSA Next Gen by PCR, Nasal     Status: Abnormal   Collection Time: 06/15/21 10:21 PM   Specimen: Nasal Mucosa; Nasal  Swab  Result Value Ref Range Status   MRSA by PCR Next Gen DETECTED (A) NOT DETECTED Final    Comment: RESULT CALLED TO, READ BACK BY AND VERIFIED WITH: E,BLUE RN @1153  06/16/21 EB (NOTE) The GeneXpert MRSA Assay (FDA approved for NASAL specimens only), is one component of a comprehensive MRSA colonization surveillance program. It is not intended to diagnose MRSA infection nor to guide or monitor treatment for MRSA infections. Test performance is not FDA approved in patients less than 68 years old. Performed at [facility], [address] [place], [place] [address]   Resp Panel by RT-PCR (Flu A&B, Covid) Nasopharyngeal Swab     Status: Abnormal   Collection Time: 06/15/21 10:36 PM   Specimen: Nasopharyngeal Swab; Nasopharyngeal(NP) swabs in vial transport medium  Result Value Ref Range Status   SARS Coronavirus 2 by RT PCR POSITIVE (A) NEGATIVE Final    Comment: RESULT CALLED TO, READ BACK BY AND VERIFIED WITH: RN EMMANUEL CASTRO BY MESSA H. AT 0110 ON 6 26 2022 (NOTE) SARS-CoV-2 target nucleic acids are DETECTED.  The SARS-CoV-2 RNA is generally detectable in upper respiratory specimens during the acute phase of infection. Positive results are indicative of the presence of the identified virus, but do not rule out bacterial infection or co-infection with other pathogens not detected by the test. Clinical correlation with patient history and other diagnostic information is necessary to determine patient infection status. The expected result is Negative.  Fact Sheet for Patients: BloggerCourse.comhttps://www.fda.gov/media/152166/download  Fact Sheet for Healthcare Providers: SeriousBroker.ithttps://www.fda.gov/media/152162/download  This test is not yet approved or cleared by the [place] FDA and  has been authorized for detection and/or diagnosis of SARS-CoV-2 by FDA under an Emergency Use Authorization (EUA).  This EUA will remain in effect (meanin g this test can be used) for  the duration of   the COVID-19 declaration under Section 564(b)(1) of the Act, 21 U.S.C. section 360bbb-3(b)(1), unless the authorization is terminated or revoked sooner.     Influenza A by PCR NEGATIVE NEGATIVE Final   Influenza B by PCR NEGATIVE NEGATIVE Final    Comment: (NOTE) The Xpert Xpress SARS-CoV-2/FLU/RSV plus assay is intended as an aid in the diagnosis of influenza from Nasopharyngeal swab specimens and should not be used as a sole basis for treatment. Nasal washings and aspirates are unacceptable for Xpert Xpress SARS-CoV-2/FLU/RSV testing.  Fact Sheet for Patients: BloggerCourse.com  Fact Sheet for Healthcare Providers: SeriousBroker.it  This test is not yet approved or cleared by the [place] FDA and has been authorized for detection and/or diagnosis of SARS-CoV-2 by FDA under an Emergency Use Authorization (EUA). This EUA will remain in effect (meaning this test can be used) for the duration of the COVID-19 declaration under Section 564(b)(1) of the Act, 21 U.S.C. section 360bbb-3(b)(1), unless the authorization is terminated or revoked.  Performed at [facility], [address], [place], [place] [address]   Culture, blood (routine x 2)     Status: None (Preliminary result)   Collection Time: 06/16/21  2:05 AM   Specimen: BLOOD LEFT HAND  Result Value Ref Range Status   Specimen Description BLOOD LEFT HAND  Final   Special Requests   Final    BOTTLES DRAWN AEROBIC ONLY Blood Culture adequate volume   Culture   Final    NO GROWTH 1 DAY Performed at [facility], [address] [place], [place] [address]    Report Status PENDING  Incomplete  Culture, blood (routine x 2)     Status: None (Preliminary result)   Collection Time: 06/16/21  2:26 AM   Specimen: BLOOD RIGHT HAND  Result Value Ref Range Status   Specimen Description BLOOD RIGHT HAND  Final   Special Requests   Final    BOTTLES DRAWN AEROBIC  ONLY Blood Culture adequate volume   Culture   Final    NO GROWTH 1 DAY Performed at [facility], [address], [place], [place] [address]    Report Status PENDING  Incomplete    Gardiner Barefoot, MD [facility] [email or web address] 06/17/2021, 11:49 AM

## 2021-06-17 NOTE — Progress Notes (Signed)
Lower extremity venous bilateral study completed.  Preliminary results relayed to Randie Heinz, MD.   See CV Proc for preliminary results report.   Jean Rosenthal, RDMS, RVT

## 2021-06-17 NOTE — Progress Notes (Addendum)
Pharmacy Antibiotic Note  Lonnie [name] is a 70 y.o. male admitted on 06/15/2021 with wound infection  Pharmacy has been consulted for vancomycin dosing. Patient previously started on linezolid. MD now wishes to start vancomycin. AKI on admission has resolved and patient is back to baseline  ID is on board for consult. Abx has been optimized to cefepime for now to cover for UTI. Ok to change remdesivir to 3 days and reduce Celexa to 20mg /day due to risk of Qtc prolongation per Dr. .   Crcl>60  Plan: Cefepime 2g IV q12 F/u with cultures Reduce celexa to 20mg  po qday  Height: 6\' 2"  (188 cm) Weight: 84.9 kg (187 lb 2.7 oz) IBW/kg (Calculated) : 82.2  Temp (24hrs), Avg:98.4 F (36.9 C), Min:98.3 F (36.8 C), Max:98.5 F (36.9 C)  Recent Labs  Lab 06/15/21 1809 06/16/21 0553 06/16/21 0942 06/17/21 0015 06/17/21 1133  WBC 21.1* 17.0*  --  19.7* 20.9*  CREATININE 1.49* 0.97  --  0.87  --   LATICACIDVEN  --  1.5 1.3  --   --      Estimated Creatinine Clearance: 94.5 mL/min (by C-G formula based on SCr of 0.87 mg/dL).    Allergies  Allergen Reactions   Ace Inhibitors Swelling and Other (See Comments)    Angioedema   Eggs Or Egg-Derived Products Nausea Only and Other (See Comments)    Cannot eat prepared eggs- Occasional nausea   Lactose Intolerance (Gi) Diarrhea, Nausea Only and Other (See Comments)    Flatulence, also   Latex Itching    Antimicrobials this admission: 6/25 Linezolid >>6/26 6/26 Vancomycin>>6/27 6/25 CTX >>6/27 6/27 cefepime>> 6/26 Remdesivir >>6/28  Dose adjustments this admission: N/a  Microbiology results: 6/26 blood>>ngtd MRSA neg 6/25 urine>>  7/26, PharmD, [place], AAHIVP, CPP Infectious Disease Pharmacist 06/17/2021 1:01 PM

## 2021-06-17 NOTE — Progress Notes (Addendum)
PROGRESS NOTE  Lonnie MareLarry F Paz ZOX:[id number] DOB: 10-19-1951   PCP: Wanda PlumpPaz, Jose E, MD  Patient is from: Home  DOA: 06/15/2021 LOS: 1  Chief complaints:  Confusion, generalized weakness and urinary retention  Brief Narrative / Interim history: 70 year old M with PMH of dementia, PAD s/p mechanical thrombectomy and stenting of occluded femoral popliteal bypass on 6/18, ongoing tobacco use, HTN, HLD, BPH, GERD, anxiety and depression returning with confusion, generalized weakness and urinary retention.  Patient was discharged to SNF and went home from SNF on 06/14/2021.    In ED, RLQ drain in situ with bladder scan showing 900 cc, AKI and possible UTI and right foot infection.  Hemodynamically stable.  Mild temp to 99.1.  Initially saturating at 99% on RA. Cr 1.49 (baseline 0.91).  COVID-19 PCR returned positive.  Patient was started on ceftriaxone for UTI and Zyvox for possible right foot infection, and Decadron and remdesivir for COVID-19 infection with desaturation to 93% on RA.  Vascular surgery and ID consulted.  Improving.  Subjective: Seen and examined earlier this morning.  No major events overnight of this morning.  Has no complaints.  He denies pain, shortness of breath, cough, GI or UTI symptoms.  Somewhat dark, hard looking stool with bigger caliber in bedside commode.   Objective: Vitals:   06/16/21 0957 06/16/21 1646 06/16/21 2130 06/17/21 0615  BP: 137/68 124/62 130/61 127/66  Pulse: 90 65 61 69  Resp: 18 18 16 17   Temp: 99 F (37.2 C) 98.5 F (36.9 C) 98.4 F (36.9 C) 98.3 F (36.8 C)  TempSrc: Oral Oral  Oral  SpO2: 96% 97% 97% 98%  Weight:      Height:        Intake/Output Summary (Last 24 hours) at 06/17/2021 1143 Last data filed at 06/17/2021 1101 Gross per 24 hour  Intake 1492.01 ml  Output 2875 ml  Net -1382.99 ml   Filed Weights   06/16/21 0300  Weight: 84.9 kg    Examination:  GENERAL: Sitting on the edge of the bed.  No apparent distress.    HEENT: MMM.  Vision and hearing grossly intact.  NECK: Supple.  No apparent JVD.  RESP: 98% on RA.  No IWOB.  Fair aeration bilaterally. CVS:  RRR. Heart sounds normal.  ABD/GI/GU: BS+. Abd soft, NTND.  Indwelling Foley catheter.  Clear looking urine. MSK/EXT:  Moves extremities. No apparent deformity.  1+ pitting edema in RLE. SKIN: Ace wrap and dressing over right foot DCI. NEURO: Awake and alert. Oriented x4 except date.  No apparent focal neuro deficit. PSYCH: Calm. Normal affect.   Procedures:  None  Microbiology summarized: COVID-19 PCR positive. MRSA PCR screen positive. Blood cultures NGTD. Urine culture pending  Assessment & Plan: AKI/azotemia: Likely mix of prerenal and postobstructive. He is also on Lasix and Avapro which could contribute.  Resolved. Recent Labs    09/11/20 1231 09/12/20 0357 09/26/20 1515 05/24/21 1549 06/07/21 1025 06/08/21 0317 06/10/21 0249 06/15/21 1809 06/16/21 0553 06/17/21 0015  BUN 17 14 17 17 19 18 18  32* 23 16  CREATININE 1.08 1.04 1.16 0.94 1.00 0.98 0.91 1.49* 0.97 0.87  -Voiding trial today. -Continue holding Avapro.  Right foot ulcer/wound-infected? PAD s/p recent mechanical thrombectomy and stent of occluded femoropopliteal bypass -Wound seems to be about the same but significant edema in right leg. -Currently on vancomycin -Appreciate recommendation by vascular surgery -Appreciate help by WOCN -ID consulted  Acute metabolic encephalopathy-could be from UTI, COVID-19 infection, polypharmacy, delirium in the  setting of dementia.  Seems to have resolved.  He is oriented x4 except date.  -Treat treatable causes -Reorientation and delirium precautions -May have to cut down on his Seroquel, gabapentin and Klonopin if worse.   Acute COVID-19 pneumonia: symptomatic for 1 day.  Tested positive on 6/25.  Desaturated to 93% on RA.  CXR with increased patchy opacities RIGHT lung base: Cardiomegaly and mild diffuse pulmonary  interstitial congestion. CRP 29. Now saturating at 98% on RA.  Recent Labs    06/16/21 0553 06/17/21 0015  DDIMER 2.21* 1.88*  FERRITIN 84 83  LDH 206*  --   CRP 29.5* 21.4*  -Continue Decadron and remdesivir -Already on ceftriaxone for UTI and vancomycin for possible right foot infection  -Subcu Lovenox for VTE prophylaxis. -Protonix for GI prophylaxis -Monitor inflammatory markers.   Elevated D-dimer: Likely due to COVID infection.  Low suspicion for PE. -Check LE [place] to exclude DVT   Acute urinary retention?  In ED, bladder scan with 900 cc but it seems only 350 cc output after Foley placement.  -Continue Flomax -Manage constipation as below -Voiding trial today  Presumptive UTI: UA with moderate pyuria and bacteriuria.  Has leukocytosis as well. -Continue IV ceftriaxone -Follow urine culture  Elevated liver enzymes: due to COVID-19 infection?  Slightly worse today. -Check CK -Treat COVID-19 as above -Continue trending -Acute hepatitis panel in the morning   ICM/chronic combined CHF: TTE in 2018 with LVEF of 40 to 45%, G1-DD.  Appears to have significant edema in RLE likely from recent revascularization.  However, CXR with some interstitial venous congestion.  BNP 365 but no prior to compare to.  Heart 3.3 L UOP/24 hours after resuming low-dose Lasix.  Renal function improving. -Continue home Lasix. -Monitor fluid and respiratory status  History of CAD s/p CABG: Stable. -Continue Lopressor, aspirin, Plavix, Lipitor   Chronic anxiety/depression: Stable -Continue home meds   Essential hypertension: Normotensive. -Cardiac meds as above   Hyperlipidemia -Continue home Lipitor   Iron deficiency anemia/ anemia of chronic disease: Hgb dropped about 2 g since admission.  Seems to have dark stool but no hematochezia Recent Labs    06/07/21 1619 06/07/21 2043 06/08/21 0317 06/08/21 1435 06/09/21 1055 06/09/21 1828 06/10/21 0249 06/15/21 1809 06/16/21 0553  06/17/21 0015  HGB 7.8* 8.8* 7.6* 7.5* 5.8* 8.7* 8.6* 8.9* 8.4* 7.1*  -Recheck CBC  -Transfuse to keep hemoglobin above 8.0 given history of CAD  GERD -Continue PPI   Generalized weakness: Ambulates using walker at baseline. -PT/OT   Tobacco use disorder: smokes 2 to 3 cigarettes/day -Counseled family. -Nicotine patch  Leukocytosis/bandemia-likely mix of demargination and infection. -Continue monitoring  Constipation: had somewhat dark and hard large caliber stool this morning -Senokot-S twice daily  -MiraLAX twice daily as needed  Goal of care counseling: Discussed with patient's daughter, [name].  [name] says patient has DNR and DNI. I have changed CODE STATUS to DNR/DNI and notify patient's RN.  Body mass index is 24.03 kg/m.         DVT prophylaxis:  enoxaparin (LOVENOX) injection 40 mg Start: 06/15/21 2200 SCDs Start: 06/15/21 2150  Code Status: DNR/DNI Family Communication: Patient and/or RN.  Updated patient's daughter over the phone. Level of care: Telemetry Medical Status is: Inpatient  Remains inpatient appropriate because:Ongoing diagnostic testing needed not appropriate for outpatient work up, Unsafe d/c plan, IV treatments appropriate due to intensity of illness or inability to take PO, and Inpatient level of care appropriate due to severity of illness  Dispo:  Patient From: Home  Planned Disposition: Skilled Nursing Facility  Medically stable for discharge: No       Consultants:  Vascular surgery Infectious disease   Sch Meds:  Scheduled Meds:  aspirin EC  81 mg Oral Daily   atorvastatin  80 mg Oral QPM   Chlorhexidine Gluconate Cloth  6 each Topical Daily   citalopram  40 mg Oral Daily   clopidogrel  75 mg Oral Daily   dexamethasone (DECADRON) injection  6 mg Intravenous Q24H   enoxaparin (LOVENOX) injection  40 mg Subcutaneous Q24H   ferrous fumarate-b12-vitamic C-folic acid  1 capsule Oral Daily   furosemide  20 mg Oral Daily    gabapentin  100 mg Oral BID   melatonin  10 mg Oral QPM   metoprolol tartrate  12.5 mg Oral BID   multivitamin with minerals  1 tablet Oral Daily   nicotine  7 mg Transdermal Daily   pantoprazole  40 mg Oral QPM   QUEtiapine  200 mg Oral BID   senna-docusate  1 tablet Oral BID   sodium chloride flush  3 mL Intravenous Once   tamsulosin  0.8 mg Oral QPM   Continuous Infusions:  cefTRIAXone (ROCEPHIN)  IV 2 g (06/16/21 2214)   remdesivir 100 mg in NS 100 mL     vancomycin Stopped (06/17/21 1109)   PRN Meds:.acetaminophen, albuterol, clonazePAM, guaiFENesin-dextromethorphan, melatonin, polyethylene glycol, prochlorperazine  Antimicrobials: Anti-infectives (From admission, onward)    Start     Dose/Rate Route Frequency Ordered Stop   06/17/21 1000  remdesivir 100 mg in sodium chloride 0.9 % 100 mL IVPB       See Hyperspace for full Linked Orders Report.   100 mg 200 mL/hr over 30 Minutes Intravenous Daily 06/16/21 0135 06/21/21 0959   06/16/21 2200  cefTRIAXone (ROCEPHIN) 2 g in sodium chloride 0.9 % 100 mL IVPB        2 g 200 mL/hr over 30 Minutes Intravenous Every 24 hours 06/15/21 2304     06/16/21 2200  vancomycin (VANCOREADY) IVPB 1000 mg/200 mL        1,000 mg 200 mL/hr over 60 Minutes Intravenous Every 12 hours 06/16/21 0820     06/16/21 0915  vancomycin (VANCOREADY) IVPB 1750 mg/350 mL        1,750 mg 175 mL/hr over 120 Minutes Intravenous  Once 06/16/21 0820 06/16/21 1114   06/16/21 0230  remdesivir 200 mg in sodium chloride 0.9% 250 mL IVPB       See Hyperspace for full Linked Orders Report.   200 mg 580 mL/hr over 30 Minutes Intravenous Once 06/16/21 0135 06/16/21 0324   06/15/21 2230  linezolid (ZYVOX) IVPB 600 mg  Status:  Discontinued        600 mg 300 mL/hr over 60 Minutes Intravenous Every 12 hours 06/15/21 2228 06/16/21 0811   06/15/21 2115  cefTRIAXone (ROCEPHIN) 2 g in sodium chloride 0.9 % 100 mL IVPB        2 g 200 mL/hr over 30 Minutes Intravenous  Once  06/15/21 2114 06/15/21 2328        I have personally reviewed the following labs and images: CBC: Recent Labs  Lab 06/15/21 1809 06/16/21 0553 06/17/21 0015  WBC 21.1* 17.0* 19.7*  NEUTROABS  --  13.8* 16.1*  HGB 8.9* 8.4* 7.1*  HCT 29.3* 27.2* 23.1*  MCV 74.0* 71.8* 71.1*  PLT 479* 486* 510*   BMP &GFR Recent Labs  Lab 06/15/21 1809 06/16/21 0553 06/17/21 0015  NA 134* 136 135  K 4.1 3.8 4.0  CL 101 105 105  CO2 20* 22 20*  GLUCOSE 129* 111* 119*  BUN 32* 23 16  CREATININE 1.49* 0.97 0.87  CALCIUM 9.1 8.5* 8.1*  MG  --  2.2 2.1  PHOS  --  3.3 2.3*   Estimated Creatinine Clearance: 94.5 mL/min (by C-G formula based on SCr of 0.87 mg/dL). Liver & Pancreas: Recent Labs  Lab 06/16/21 0553 06/17/21 0015  AST 138* 152*  ALT 135* 146*  ALKPHOS 154* 126  BILITOT 0.5 0.4  PROT 7.0 6.3*  ALBUMIN 2.4* 2.2*   No results for input(s): LIPASE, AMYLASE in the last 168 hours. No results for input(s): AMMONIA in the last 168 hours. Diabetic: No results for input(s): HGBA1C in the last 72 hours. No results for input(s): GLUCAP in the last 168 hours. Cardiac Enzymes: No results for input(s): CKTOTAL, CKMB, CKMBINDEX, TROPONINI in the last 168 hours. No results for input(s): PROBNP in the last 8760 hours. Coagulation Profile: No results for input(s): INR, PROTIME in the last 168 hours. Thyroid Function Tests: No results for input(s): TSH, T4TOTAL, FREET4, T3FREE, THYROIDAB in the last 72 hours. Lipid Profile: No results for input(s): CHOL, HDL, LDLCALC, TRIG, CHOLHDL, LDLDIRECT in the last 72 hours. Anemia Panel: Recent Labs    06/16/21 0553 06/17/21 0015  FERRITIN 84 83   Urine analysis:    Component Value Date/Time   COLORURINE YELLOW 06/15/2021 2218   APPEARANCEUR TURBID (A) 06/15/2021 2218   LABSPEC 1.014 06/15/2021 2218   PHURINE 5.0 06/15/2021 2218   GLUCOSEU NEGATIVE 06/15/2021 2218   GLUCOSEU NEGATIVE 04/12/2021 1323   HGBUR SMALL (A) 06/15/2021  2218   BILIRUBINUR NEGATIVE 06/15/2021 2218   KETONESUR 5 (A) 06/15/2021 2218   PROTEINUR 100 (A) 06/15/2021 2218   UROBILINOGEN 0.2 04/12/2021 1323   NITRITE NEGATIVE 06/15/2021 2218   LEUKOCYTESUR MODERATE (A) 06/15/2021 2218   Sepsis Labs: Invalid input(s): PROCALCITONIN, LACTICIDVEN  Microbiology: Recent Results (from the past 240 hour(s))  MRSA PCR Screening     Status: Abnormal   Collection Time: 06/07/21  3:35 PM  Result Value Ref Range Status   MRSA by PCR POSITIVE (A) NEGATIVE Final    Comment:        The GeneXpert MRSA Assay (FDA approved for NASAL specimens only), is one component of a comprehensive MRSA colonization surveillance program. It is not intended to diagnose MRSA infection nor to guide or monitor treatment for MRSA infections. RESULT CALLED TO, READ BACK BY AND VERIFIED WITH: Brett Albino RN 1724 06/07/21 A BROWNING Performed at [facility], [address], [place], [place] [address]   SARS CORONAVIRUS 2 (TAT 6-24 HRS)     Status: None   Collection Time: 06/07/21  4:19 PM  Result Value Ref Range Status   SARS Coronavirus 2 NEGATIVE NEGATIVE Final    Comment: (NOTE) SARS-CoV-2 target nucleic acids are NOT DETECTED.  The SARS-CoV-2 RNA is generally detectable in upper and lower respiratory specimens during the acute phase of infection. Negative results do not preclude SARS-CoV-2 infection, do not rule out co-infections with other pathogens, and should not be used as the sole basis for treatment or other patient management decisions. Negative results must be combined with clinical observations, patient history, and epidemiological information. The expected result is Negative.  Fact Sheet for Patients: HairSlick.no  Fact Sheet for Healthcare Providers: quierodirigir.com  This test is not yet approved or cleared by the [place] FDA and  has been authorized for detection and/or diagnosis  of SARS-CoV-2 by FDA under an Emergency Use Authorization (EUA). This EUA will remain  in effect (meaning this test can be used) for the duration of the COVID-19 declaration under Se ction 564(b)(1) of the Act, 21 U.S.C. section 360bbb-3(b)(1), unless the authorization is terminated or revoked sooner.  Performed at [facility], [address], [place], [place] [address]   MRSA Next Gen by PCR, Nasal     Status: Abnormal   Collection Time: 06/15/21 10:21 PM   Specimen: Nasal Mucosa; Nasal Swab  Result Value Ref Range Status   MRSA by PCR Next Gen DETECTED (A) NOT DETECTED Final    Comment: RESULT CALLED TO, READ BACK BY AND VERIFIED WITH: E,BLUE RN @1153  06/16/21 EB (NOTE) The GeneXpert MRSA Assay (FDA approved for NASAL specimens only), is one component of a comprehensive MRSA colonization surveillance program. It is not intended to diagnose MRSA infection nor to guide or monitor treatment for MRSA infections. Test performance is not FDA approved in patients less than 24 years old. Performed at [facility], [address] [place], [place]   Resp Panel by RT-PCR (Flu A&B, Covid) Nasopharyngeal Swab     Status: Abnormal   Collection Time: 06/15/21 10:36 PM   Specimen: Nasopharyngeal Swab; Nasopharyngeal(NP) swabs in vial transport medium  Result Value Ref Range Status   SARS Coronavirus 2 by RT PCR POSITIVE (A) NEGATIVE Final    Comment: RESULT CALLED TO, READ BACK BY AND VERIFIED WITH: RN EMMANUEL CASTRO BY MESSA H. AT 0110 ON 6 26 2022 (NOTE) SARS-CoV-2 target nucleic acids are DETECTED.  The SARS-CoV-2 RNA is generally detectable in upper respiratory specimens during the acute phase of infection. Positive results are indicative of the presence of the identified virus, but do not rule out bacterial infection or co-infection with other pathogens not detected by the test. Clinical correlation with patient history and other diagnostic information is  necessary to determine patient infection status. The expected result is Negative.  Fact Sheet for Patients: 2023  Fact Sheet for Healthcare Providers: BloggerCourse.com  This test is not yet approved or cleared by the [place] FDA and  has been authorized for detection and/or diagnosis of SARS-CoV-2 by FDA under an Emergency Use Authorization (EUA).  This EUA will remain in effect (meanin g this test can be used) for the duration of  the COVID-19 declaration under Section 564(b)(1) of the Act, 21 U.S.C. section 360bbb-3(b)(1), unless the authorization is terminated or revoked sooner.     Influenza A by PCR NEGATIVE NEGATIVE Final   Influenza B by PCR NEGATIVE NEGATIVE Final    Comment: (NOTE) The Xpert Xpress SARS-CoV-2/FLU/RSV plus assay is intended as an aid in the diagnosis of influenza from Nasopharyngeal swab specimens and should not be used as a sole basis for treatment. Nasal washings and aspirates are unacceptable for Xpert Xpress SARS-CoV-2/FLU/RSV testing.  Fact Sheet for Patients: [place]  Fact Sheet for Healthcare Providers: BloggerCourse.com  This test is not yet approved or cleared by the [place] FDA and has been authorized for detection and/or diagnosis of SARS-CoV-2 by FDA under an Emergency Use Authorization (EUA). This EUA will remain in effect (meaning this test can be used) for the duration of the COVID-19 declaration under Section 564(b)(1) of the Act, 21 U.S.C. section 360bbb-3(b)(1), unless the authorization is terminated or revoked.  Performed at [facility], [address] [place], [place]  Culture, blood (routine x 2)     Status: None (Preliminary result)   Collection Time: 06/16/21  2:05 AM   Specimen: BLOOD LEFT HAND  Result Value Ref Range Status   Specimen Description BLOOD LEFT HAND  Final    Special Requests   Final    BOTTLES DRAWN AEROBIC ONLY Blood Culture adequate volume   Culture   Final    NO GROWTH 1 DAY Performed at [facility], [address] [place], [place] [address]    Report Status PENDING  Incomplete  Culture, blood (routine x 2)     Status: None (Preliminary result)   Collection Time: 06/16/21  2:26 AM   Specimen: BLOOD RIGHT HAND  Result Value Ref Range Status   Specimen Description BLOOD RIGHT HAND  Final   Special Requests   Final    BOTTLES DRAWN AEROBIC ONLY Blood Culture adequate volume   Culture   Final    NO GROWTH 1 DAY Performed at [facility], [address] [place], [place] [address]    Report Status PENDING  Incomplete    Radiology Studies: No results found.    Reita Shindler T. Deziyah Arvin [facility]  If 7PM-7AM, please contact night-coverage [email or web address] 06/17/2021, 11:43 AM

## 2021-06-17 NOTE — Telephone Encounter (Signed)
Patient currently admitted.   [facility] Night - Client TELEPHONE ADVICE RECORD AccessNurse Patient Name: JERMELL HOLEMAN Gender: Male DOB: 1951/07/05 Age: 70 Y 10 D Return Phone Number: [phone] (Secondary) Client [facility] Night - Client Client Site [facility] - Night Physician Willow Ora - MD Contact Type Call Who Is Calling Patient / Member / Family / Caregiver Call Type Triage / Clinical Caller Name [name] [place] Relationship To Patient Daughter Return Phone Number [phone] (Secondary) Chief Complaint CONFUSION - new onset Reason for Call Symptomatic / Request for Health Information Initial Comment Caller's father just discharged from hospital. The pt has a hx of UTIs. Could the caller use the abx that the pt has on hand and prescribed by Dr. Drue Novel? Pt has cloudy urine, confusion, slurred words etc. Translation No Nurse Assessment Nurse: Terrence Dupont, RN, Holly Date/Time (Eastern Time): 06/15/2021 3:55:31 PM Confirm and document reason for call. If symptomatic, describe symptoms. ---Caller's father just discharged from hospital. The pt has a hx of UTIs. Could the caller use the abx that the pt has on hand and prescribed by Dr. Drue Novel? Pt has cloudy urine, confusion, slurred words etc. caller states he has vascular surgery on his leg. caller states he has a history of Dementia. caller states he is acting exactly like he does with every UTI he has had. urine is cloudy and discolored. denies a fever. Does the patient have any new or worsening symptoms? ---Yes Will a triage be completed? ---Yes Related visit to physician within the last 2 weeks? ---Yes Does the PT have any chronic conditions? (i.e. diabetes, asthma, this includes High risk factors for pregnancy, etc.) ---Yes List chronic conditions. ---dementia, UTI, vascular disease, Is this a behavioral health or substance abuse call?  ---No Guidelines Guideline Title Affirmed Question Affirmed Notes Nurse Date/Time (Eastern Time) Urinary Symptoms Patient sounds very sick or weak to the triager Terrence Dupont, RN, [facility] 06/15/2021 3:59:22 PM PLEASE NOTE: All timestamps contained within this report are represented as [place] Standard Time. CONFIDENTIALTY NOTICE: This fax transmission is intended only for the addressee. It contains information that is legally privileged, confidential or otherwise protected from use or disclosure. If you are not the intended recipient, you are strictly prohibited from reviewing, disclosing, copying using or disseminating any of this information or taking any action in reliance on or regarding this information. If you have received this fax in error, please notify us immediately by telephone so that we can arrange for its return to [place]. Phone: [phone], Toll-Free: [phone], Fax: [phone] Page: 2 of 2 Call Id: [id number] Disp. Time Lamount Cohen Time) Disposition Final User 06/15/2021 4:01:28 PM Go to ED Now (or PCP triage) Yes [place], RN, Sharene Butters Disagree/Comply Comply Caller Understands Yes PreDisposition Did not know what to do Care Advice Given Per Guideline GO TO ED NOW (OR PCP TRIAGE): CARE ADVICE given per Urinary Symptoms (Adult) guideline. * It is better and safer if another adult drives instead of you. ANOTHER ADULT SHOULD DRIVE: Referrals [facility] - ED

## 2021-06-17 NOTE — Plan of Care (Signed)
  Problem: Clinical Measurements: Goal: Respiratory complications will improve Outcome: Progressing   Problem: Activity: Goal: Risk for activity intolerance will decrease Outcome: Progressing   

## 2021-06-18 ENCOUNTER — Inpatient Hospital Stay (HOSPITAL_COMMUNITY): Payer: Medicare Other

## 2021-06-18 ENCOUNTER — Encounter (HOSPITAL_COMMUNITY): Payer: Medicare Other

## 2021-06-18 DIAGNOSIS — G9341 Metabolic encephalopathy: Secondary | ICD-10-CM | POA: Diagnosis not present

## 2021-06-18 DIAGNOSIS — N179 Acute kidney failure, unspecified: Secondary | ICD-10-CM | POA: Diagnosis not present

## 2021-06-18 DIAGNOSIS — K5900 Constipation, unspecified: Secondary | ICD-10-CM | POA: Diagnosis not present

## 2021-06-18 DIAGNOSIS — F17200 Nicotine dependence, unspecified, uncomplicated: Secondary | ICD-10-CM | POA: Diagnosis not present

## 2021-06-18 DIAGNOSIS — S81802S Unspecified open wound, left lower leg, sequela: Secondary | ICD-10-CM

## 2021-06-18 DIAGNOSIS — D72829 Elevated white blood cell count, unspecified: Secondary | ICD-10-CM | POA: Diagnosis not present

## 2021-06-18 LAB — CBC WITH DIFFERENTIAL/PLATELET
Abs Immature Granulocytes: 0.78 10*3/uL — ABNORMAL HIGH (ref 0.00–0.07)
Basophils Absolute: 0.1 10*3/uL (ref 0.0–0.1)
Basophils Relative: 0 %
Eosinophils Absolute: 0 10*3/uL (ref 0.0–0.5)
Eosinophils Relative: 0 %
HCT: 27.4 % — ABNORMAL LOW (ref 39.0–52.0)
Hemoglobin: 8.5 g/dL — ABNORMAL LOW (ref 13.0–17.0)
Immature Granulocytes: 4 %
Lymphocytes Relative: 8 %
Lymphs Abs: 1.6 10*3/uL (ref 0.7–4.0)
MCH: 21.9 pg — ABNORMAL LOW (ref 26.0–34.0)
MCHC: 31 g/dL (ref 30.0–36.0)
MCV: 70.6 fL — ABNORMAL LOW (ref 80.0–100.0)
Monocytes Absolute: 0.7 10*3/uL (ref 0.1–1.0)
Monocytes Relative: 3 %
Neutro Abs: 17.2 10*3/uL — ABNORMAL HIGH (ref 1.7–7.7)
Neutrophils Relative %: 85 %
Platelets: 724 10*3/uL — ABNORMAL HIGH (ref 150–400)
RBC: 3.88 MIL/uL — ABNORMAL LOW (ref 4.22–5.81)
RDW: 19.9 % — ABNORMAL HIGH (ref 11.5–15.5)
WBC: 20.3 10*3/uL — ABNORMAL HIGH (ref 4.0–10.5)
nRBC: 0 % (ref 0.0–0.2)

## 2021-06-18 LAB — URINE CULTURE

## 2021-06-18 LAB — COMPREHENSIVE METABOLIC PANEL
ALT: 264 U/L — ABNORMAL HIGH (ref 0–44)
AST: 215 U/L — ABNORMAL HIGH (ref 15–41)
Albumin: 2.6 g/dL — ABNORMAL LOW (ref 3.5–5.0)
Alkaline Phosphatase: 122 U/L (ref 38–126)
Anion gap: 10 (ref 5–15)
BUN: 18 mg/dL (ref 8–23)
CO2: 21 mmol/L — ABNORMAL LOW (ref 22–32)
Calcium: 8.8 mg/dL — ABNORMAL LOW (ref 8.9–10.3)
Chloride: 103 mmol/L (ref 98–111)
Creatinine, Ser: 0.74 mg/dL (ref 0.61–1.24)
GFR, Estimated: >60 mL/min (ref >60)
Glucose, Bld: 125 mg/dL — ABNORMAL HIGH (ref 70–99)
Potassium: 4.3 mmol/L (ref 3.5–5.1)
Sodium: 134 mmol/L — ABNORMAL LOW (ref 135–145)
Total Bilirubin: 0.4 mg/dL (ref 0.3–1.2)
Total Protein: 7.2 g/dL (ref 6.5–8.1)

## 2021-06-18 LAB — C-REACTIVE PROTEIN: CRP: 10.1 mg/dL — ABNORMAL HIGH (ref <1.0)

## 2021-06-18 LAB — PHOSPHORUS: Phosphorus: 2.8 mg/dL (ref 2.5–4.6)

## 2021-06-18 LAB — FERRITIN: Ferritin: 75 ng/mL (ref 24–336)

## 2021-06-18 LAB — MAGNESIUM: Magnesium: 2 mg/dL (ref 1.7–2.4)

## 2021-06-18 LAB — CK: Total CK: 38 U/L — ABNORMAL LOW (ref 49–397)

## 2021-06-18 LAB — D-DIMER, QUANTITATIVE: D-Dimer, Quant: 2.36 ug/mL-FEU — ABNORMAL HIGH (ref 0.00–0.50)

## 2021-06-18 IMAGING — US US ABDOMEN LIMITED
1 series · 14 of 25 positions shown · non-contrast
Comparison: CT [DATE]

CLINICAL DATA: Elevated LFT

EXAM:
ULTRASOUND ABDOMEN LIMITED RIGHT UPPER QUADRANT

[Series 1: us abdomen limited ruq (liver/gb) · 14 of 48 slices shown]
[im 1/48]
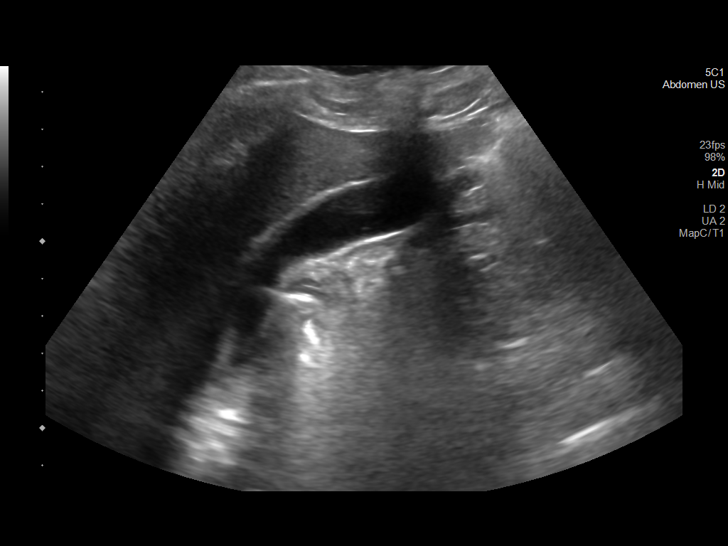
[im 4/48]
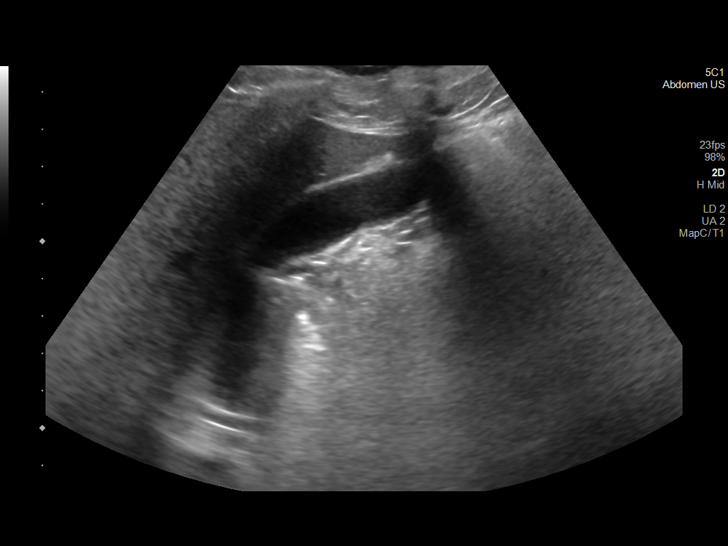
[im 8/48]
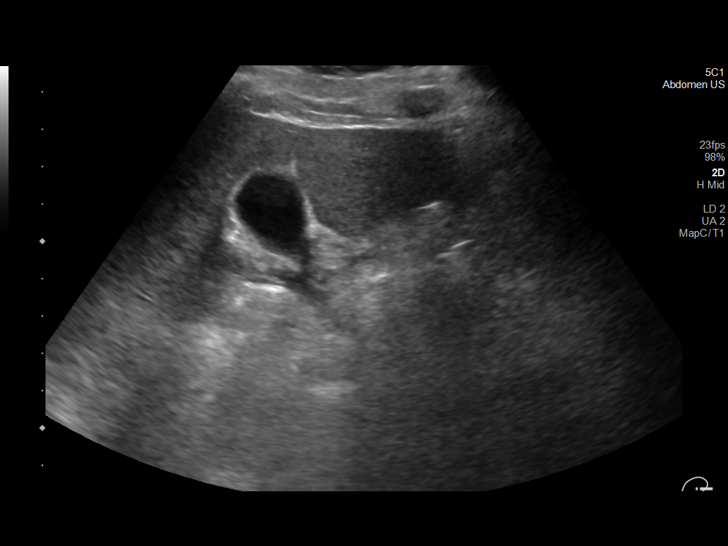
[im 12/48]
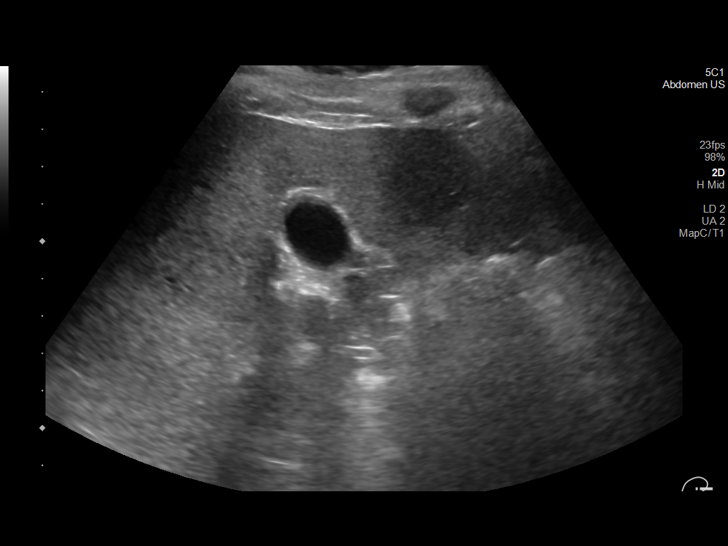
[im 16/48]
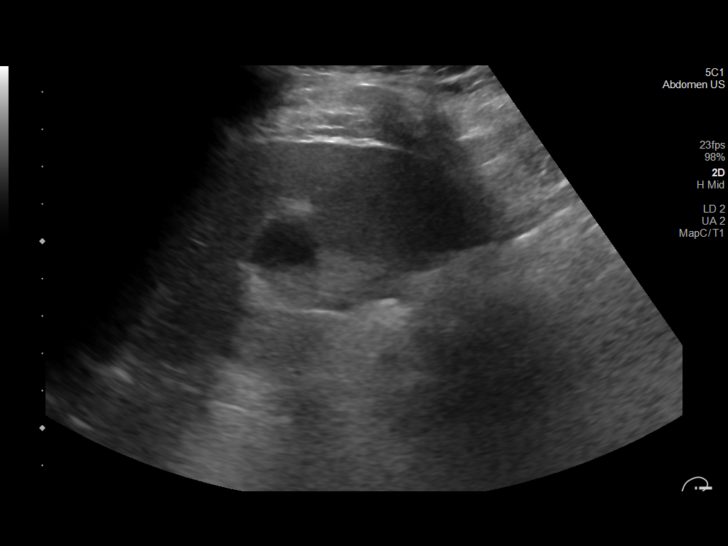
[im 18/48]
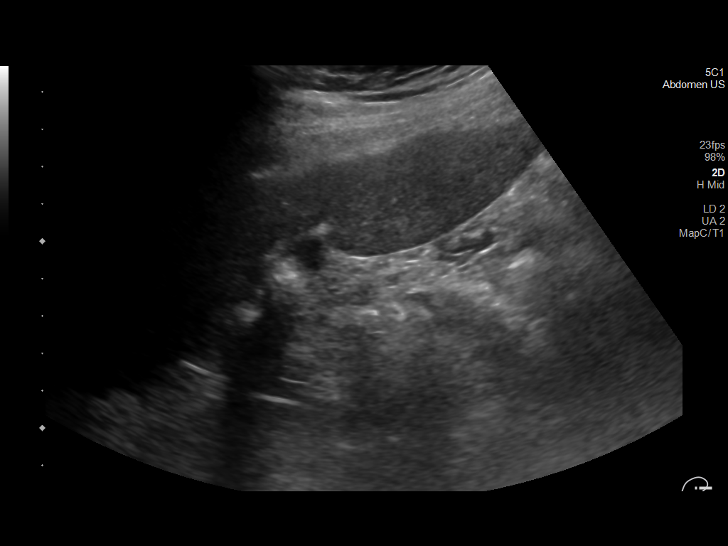
[im 22/48]
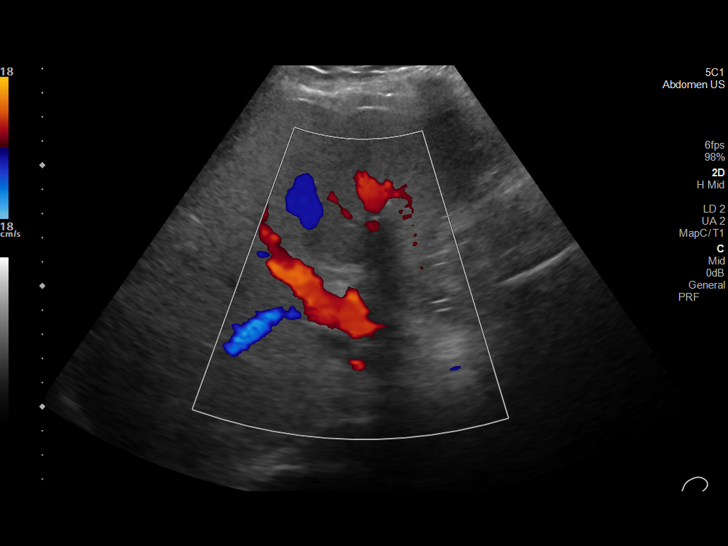
[im 26/48]
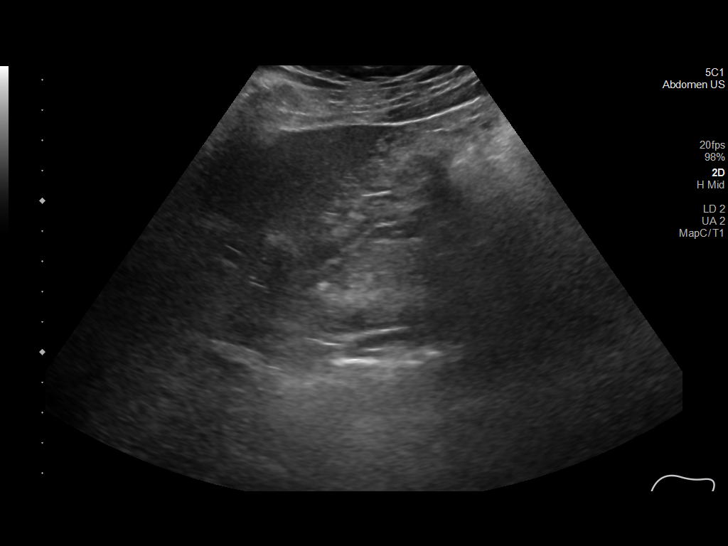
[im 30/48]
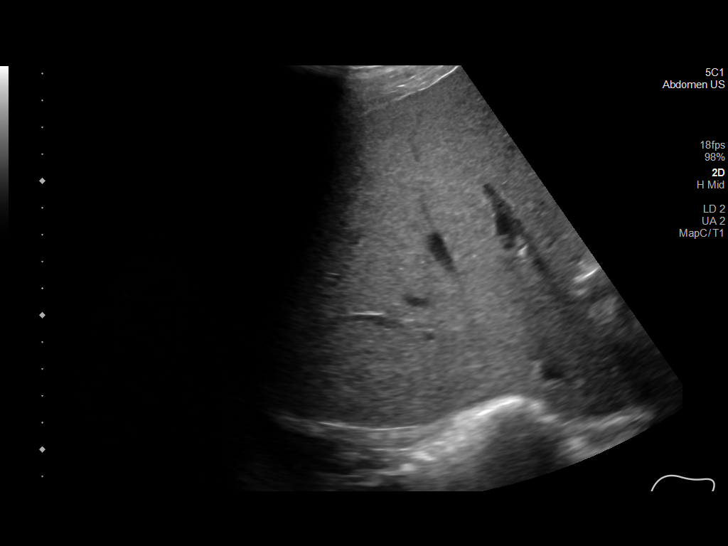
[im 32/48]
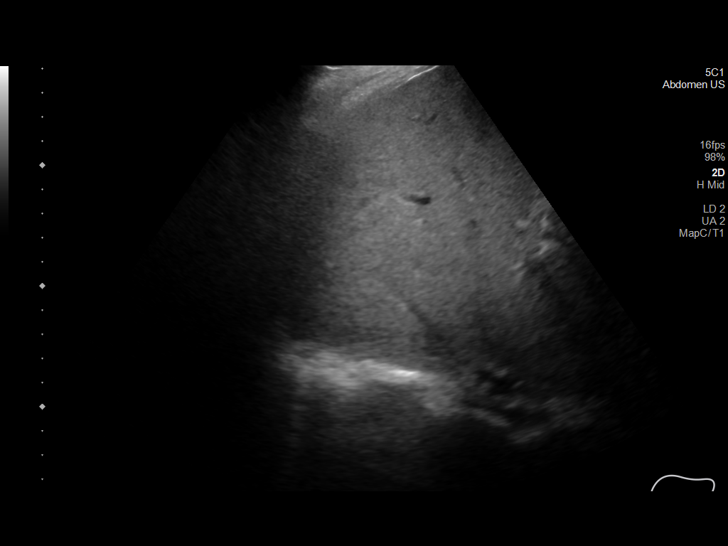
[im 36/48]
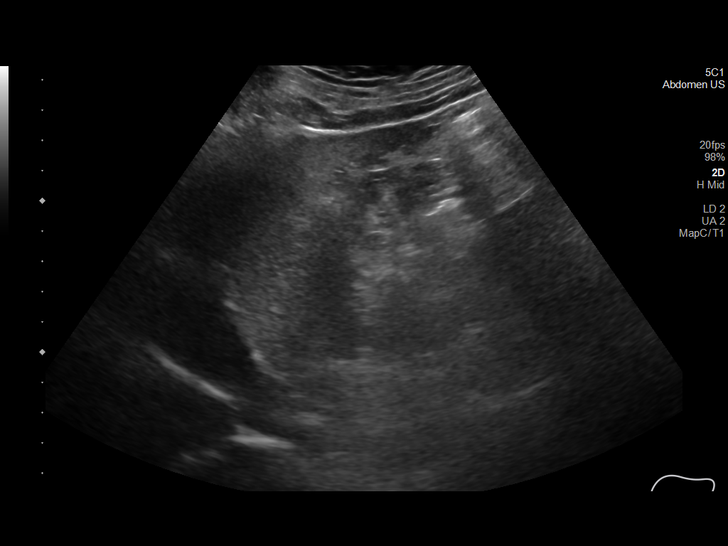
[im 40/48]
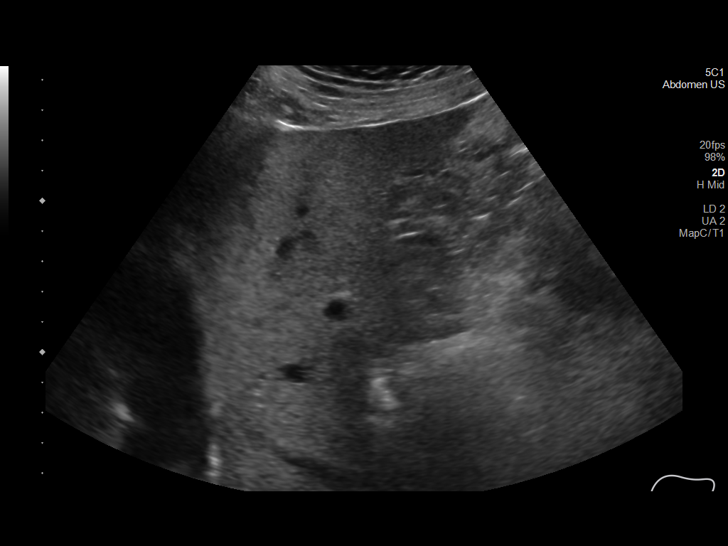
[im 44/48]
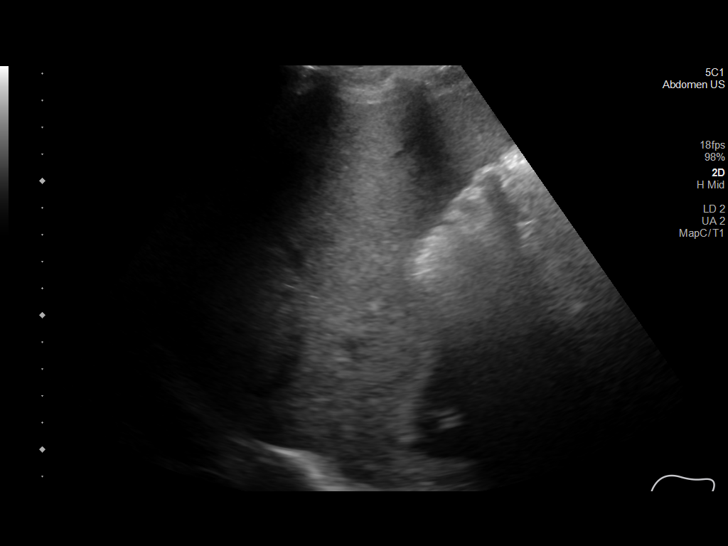
[im 48/48]
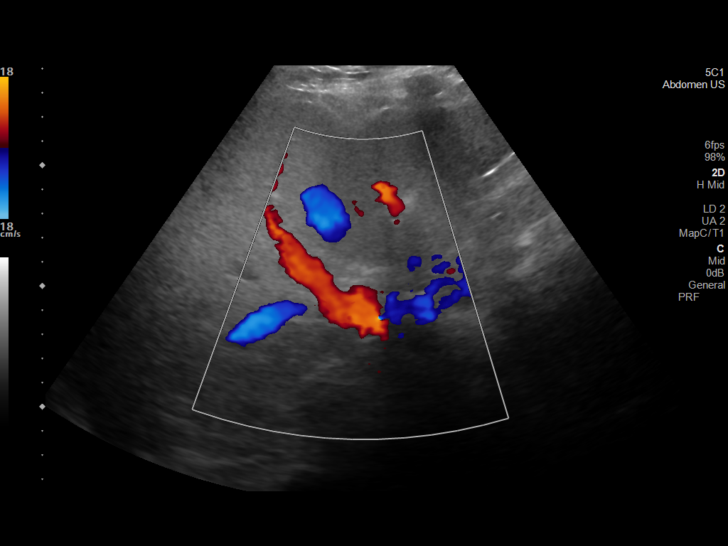

[14 of 25 positions shown; findings below may reference images not displayed]

FINDINGS: Gallbladder:

No gallstones or wall thickening visualized. No sonographic Murphy
sign noted by sonographer.

Common bile duct:

Diameter: 5.9 mm

Liver:

Liver appears slightly echogenic. No focal hepatic abnormality.
Portal vein is patent on color Doppler imaging with normal direction
of blood flow towards the liver.

Other: None.
IMPRESSION: 1. Negative for gallstones.
2. Liver appears slightly echogenic suggesting steatosis

## 2021-06-18 MED ORDER — IRBESARTAN 75 MG PO TABS
75.0000 mg | ORAL_TABLET | Freq: Every day | ORAL | Status: DC
  Administered 2021-06-18 – 2021-06-26 (×9): 75 mg via ORAL
  Filled 2021-06-18 (×9): qty 1

## 2021-06-18 NOTE — Progress Notes (Signed)
PROGRESS NOTE  Lonnie [name] ZOX:[id number] DOB: April 12, 1951   PCP: [name], MD  Patient is from: Home  DOA: 06/15/2021 LOS: 2  Chief complaints:  Confusion, generalized weakness and urinary retention  Brief Narrative / Interim history: 70 year old M with PMH of dementia, PAD s/p mechanical thrombectomy and stenting of occluded femoral popliteal bypass on 6/18, ongoing tobacco use, HTN, HLD, BPH, GERD, anxiety and depression returning with confusion, generalized weakness and urinary retention.  Patient was discharged to SNF and went home from SNF on 06/14/2021.    In ED, RLQ drain in situ with bladder scan showing 900 cc, AKI and possible UTI and right foot infection.  Hemodynamically stable.  Mild temp to 99.1.  Initially saturating at 99% on RA. Cr 1.49 (baseline 0.91).  COVID-19 PCR returned positive.  Patient was started on ceftriaxone for UTI and Zyvox for possible right foot infection, and Decadron and remdesivir for COVID-19 infection with desaturation to 93% on RA.  Vascular surgery and ID consulted.  No concern for foot infection at this time.  ID recommended IV cefepime for UTI given prior history of Pseudomonas.  Completed 3 days of remdesivir.  Remains on Decadron.  Encephalopathy and urinary tension resolved.  Therapy recommended SNF.  He could be ready if LFT improves.  Subjective: Seen and examined earlier this morning.  No major events overnight or this morning.  He passed voiding trial yesterday. Denies chest pain, dyspnea, GI or UTI symptoms.  He says he feels some pain in right lower extremity below his knee at times.  He is not in pain now.  Objective: Vitals:   06/17/21 1832 06/17/21 2114 06/18/21 0559 06/18/21 0830  BP: (!) 150/78 (!) 153/81 (!) 155/84 (!) 150/85  Pulse: (!) 55 (!) 58 63 68  Resp: Temp: 98.4 F (36.9 C) 98.2 F (36.8 C) 98.2 F (36.8 C) 98.4 F (36.9 C)  TempSrc: Oral Oral Oral Oral  SpO2: 99% 98% 97% 98%  Weight:       Height:        Intake/Output Summary (Last 24 hours) at 06/18/2021 1603 Last data filed at 06/18/2021 1557 Gross per 24 hour  Intake 440 ml  Output 2490 ml  Net -2050 ml   Filed Weights   06/16/21 0300  Weight: 84.9 kg    Examination:  GENERAL: No apparent distress.  Nontoxic. HEENT: MMM.  Vision and hearing grossly intact.  NECK: Supple.  No apparent JVD.  RESP: On RA.  No IWOB.  Fair aeration bilaterally. CVS:  RRR. Heart sounds normal.  ABD/GI/GU: BS+. Abd soft, NTND.  MSK/EXT:  Moves extremities. No apparent deformity.  1+ pitting edema in RLE. SKIN: no apparent skin lesion or wound NEURO: Awake and alert. Oriented x4 except date.  No apparent focal neuro deficit. PSYCH: Calm. Normal affect.   Procedures:  None  Microbiology summarized: COVID-19 PCR positive. MRSA PCR screen positive. Blood cultures NGTD. Urine culture with multiple species.  Assessment & Plan: AKI/azotemia: Likely mix of prerenal and postobstructive. He is also on Lasix and Avapro which could contribute.  Resolved. Recent Labs    09/12/20 0357 09/26/20 1515 05/24/21 1549 06/07/21 1025 06/08/21 0317 06/10/21 0249 06/15/21 1809 06/16/21 0553 06/17/21 0015 06/18/21 1204  BUN 32* CREATININE 1.04 1.16 0.94 1.00 0.98 0.91 1.49* 0.97 0.87 0.74  -Passed voiding trial.  Excellent urine output. -Continue monitoring  Right foot ulcer/wound-low suspicion for foot  infection by all experts PAD s/p recent mechanical thrombectomy and stent of occluded femoropopliteal bypass -Wound seems to be about the same but significant edema in right leg. -Zyvox>> vancomycin>> off per ID.  Low suspicion for foot infection. -Appreciate recommendation by vascular surgery and ID -Appreciate help by WOCN  Acute metabolic encephalopathy-could be from UTI, COVID-19 infection, polypharmacy, delirium in the setting of dementia.  Seems to have resolved.  He is oriented x4 except date.   -Treat treatable causes -Reorientation and delirium precautions -May have to cut down on his Seroquel, gabapentin and Klonopin if worse.   Acute COVID-19 pneumonia: symptomatic for 1 day.  Tested positive on 6/25.  Desaturated to 93% on RA.  CXR with increased patchy opacities RIGHT lung base: Cardiomegaly and mild diffuse pulmonary interstitial congestion. CRP 29. Now saturating at 98% on RA.  Recent Labs    06/16/21 0553 06/17/21 0015 06/18/21 1204  DDIMER 2.21* 1.88* 2.36*  FERRITIN 84 83 75  LDH 206*  --   --   CRP 29.5* 21.4* 10.1*  -Completed 3 days of remdesivir -Remains on Decadron -Subcu Lovenox for VTE prophylaxis. -Protonix for GI prophylaxis -Monitor inflammatory markers.   Elevated D-dimer: Likely due to COVID infection.  Low suspicion for PE.  LE [place] negative for DVT.   Acute urinary retention: Resolved.  -Continue Flomax and bethanechol. -Manage constipation as below -Monitor urine output  Presumptive UTI: UA with moderate pyuria and bacteriuria.  Urine culture with multiple species.  -Escalated from IV CTX to IV cefepime by ID given history of Pseudomonas.  Last dose on 6/29  Elevated liver enzymes: could be due to COVID-19 infection, remdesivir and a/or statin.  CK within normal. -Off remdesivir.  -Discontinued statin -Check acute hepatitis panel, HIV and RUQ ultrasound -Continue trending   ICM/chronic combined CHF: TTE in 2018 with LVEF of 40 to 45%, G1-DD.  Appears to have significant edema in RLE likely from recent revascularization.  However, CXR with some interstitial venous congestion.  BNP 365 but no prior to compare to.  About 2 L UOP/24 hours.  Net -2.2 L.  Renal function stable. -Continue home Lasix. -Monitor fluid and respiratory status  History of CAD s/p CABG: Stable. -Continue Lopressor, aspirin, Plavix   Chronic anxiety/depression: Stable -Continue home meds   Essential hypertension: BP slightly elevated. -Continue home Lasix -Restart  low-dose Avapro   Hyperlipidemia -Holding Lipitor due to elevated liver enzymes   Iron deficiency anemia/ anemia of chronic disease: Hgb dropped about 2 g since admission.  Seems to have dark stool but no hematochezia Recent Labs    06/08/21 0317 06/08/21 1435 06/09/21 1055 06/09/21 1828 06/10/21 0249 06/15/21 1809 06/16/21 0553 06/17/21 0015 06/17/21 1133 06/18/21 1204  HGB 7.6* 7.5* 5.8* 8.7* 8.6* 8.9* 8.4* 7.1* 8.0* 8.5*  -Recheck CBC  -Transfuse to keep hemoglobin above 8.0 given history of CAD  GERD -Continue PPI   Generalized weakness: Ambulates using walker at baseline. -PT/OT   Tobacco use disorder: smokes 2 to 3 cigarettes/day -Counseled family. -Nicotine patch  Leukocytosis/bandemia-likely demargination from steroid. -Continue monitoring  Constipation: seems to have resolved. -Senokot-S twice daily  -MiraLAX twice daily as needed  Goal of care counseling: Discussed with patient's daughter, [name].  [name] says patient has DNR and DNI. I have changed CODE STATUS to DNR/DNI and notify patient's RN.  Body mass index is 24.03 kg/m.         DVT prophylaxis:  enoxaparin (LOVENOX) injection 40 mg Start: 06/15/21 2200 SCDs Start: 06/15/21 2150  Code Status: DNR/DNI Family Communication: Patient and/or RN.  Updated patient's daughter over the phone. Level of care: Telemetry Medical Status is: Inpatient  Remains inpatient appropriate because:Ongoing diagnostic testing needed not appropriate for outpatient work up, Unsafe d/c plan, IV treatments appropriate due to intensity of illness or inability to take PO, and Inpatient level of care appropriate due to severity of illness  Dispo:  Patient From: Home  Planned Disposition: Skilled Nursing Facility  Medically stable for discharge: No       Consultants:  Vascular surgery Infectious disease   Sch Meds:  Scheduled Meds:  aspirin EC  81 mg Oral Daily   bethanechol  10 mg Oral TID    Chlorhexidine Gluconate Cloth  6 each Topical Daily   citalopram  20 mg Oral Daily   clopidogrel  75 mg Oral Daily   dexamethasone (DECADRON) injection  6 mg Intravenous Q24H   enoxaparin (LOVENOX) injection  40 mg Subcutaneous Q24H   ferrous fumarate-b12-vitamic C-folic acid  1 capsule Oral Daily   furosemide  20 mg Oral Daily   gabapentin  100 mg Oral BID   irbesartan  75 mg Oral Daily   melatonin  10 mg Oral QPM   metoprolol tartrate  12.5 mg Oral BID   multivitamin with minerals  1 tablet Oral Daily   nicotine  7 mg Transdermal Daily   pantoprazole  40 mg Oral QPM   QUEtiapine  200 mg Oral BID   senna-docusate  1 tablet Oral BID   sodium chloride flush  3 mL Intravenous Once   tamsulosin  0.8 mg Oral QPM   Continuous Infusions:  ceFEPime (MAXIPIME) IV 2 g (06/18/21 1423)   PRN Meds:.acetaminophen, albuterol, clonazePAM, guaiFENesin-dextromethorphan, melatonin, polyethylene glycol, prochlorperazine  Antimicrobials: Anti-infectives (From admission, onward)    Start     Dose/Rate Route Frequency Ordered Stop   06/17/21 1400  ceFEPIme (MAXIPIME) 2 g in sodium chloride 0.9 % 100 mL IVPB        2 g 200 mL/hr over 30 Minutes Intravenous Every 12 hours 06/17/21 1258 06/19/21 1359   06/17/21 1000  remdesivir 100 mg in sodium chloride 0.9 % 100 mL IVPB       See Hyperspace for full Linked Orders Report.   100 mg 200 mL/hr over 30 Minutes Intravenous Daily 06/16/21 0135 06/18/21 1026   06/16/21 2200  cefTRIAXone (ROCEPHIN) 2 g in sodium chloride 0.9 % 100 mL IVPB  Status:  Discontinued        2 g 200 mL/hr over 30 Minutes Intravenous Every 24 hours 06/15/21 2304 06/17/21 1218   06/16/21 2200  vancomycin (VANCOREADY) IVPB 1000 mg/200 mL  Status:  Discontinued        1,000 mg 200 mL/hr over 60 Minutes Intravenous Every 12 hours 06/16/21 0820 06/17/21 1218   06/16/21 0915  vancomycin (VANCOREADY) IVPB 1750 mg/350 mL        1,750 mg 175 mL/hr over 120 Minutes Intravenous  Once  06/16/21 0820 06/16/21 1114   06/16/21 0230  remdesivir 200 mg in sodium chloride 0.9% 250 mL IVPB       See Hyperspace for full Linked Orders Report.   200 mg 580 mL/hr over 30 Minutes Intravenous Once 06/16/21 0135 06/16/21 0324   06/15/21 2230  linezolid (ZYVOX) IVPB 600 mg  Status:  Discontinued        600 mg 300 mL/hr over 60 Minutes Intravenous Every 12 hours 06/15/21 2228 06/16/21 0811   06/15/21 2115  cefTRIAXone (ROCEPHIN)  2 g in sodium chloride 0.9 % 100 mL IVPB        2 g 200 mL/hr over 30 Minutes Intravenous  Once 06/15/21 2114 06/15/21 2328        I have personally reviewed the following labs and images: CBC: Recent Labs  Lab 06/15/21 1809 06/16/21 0553 06/17/21 0015 06/17/21 1133 06/18/21 1204  WBC 21.1* 17.0* 19.7* 20.9* 20.3*  NEUTROABS  --  13.8* 16.1*  --  17.2*  HGB 8.9* 8.4* 7.1* 8.0* 8.5*  HCT 29.3* 27.2* 23.1* 25.8* 27.4*  MCV 74.0* 71.8* 71.1* 71.7* 70.6*  PLT 479* 486* 510* 572* 724*   BMP &GFR Recent Labs  Lab 06/15/21 1809 06/16/21 0553 06/17/21 0015 06/18/21 1204  NA 134* 136 135 134*  K 4.1 3.8 4.0 4.3  CL 101 105 105 103  CO2 20* 22 20* 21*  GLUCOSE 129* 111* 119* 125*  BUN 32* CREATININE 1.49* 0.97 0.87 0.74  CALCIUM 9.1 8.5* 8.1* 8.8*  MG  --  2.2 2.1 2.0  PHOS  --  3.3 2.3* 2.8   Estimated Creatinine Clearance: 102.8 mL/min (by C-G formula based on SCr of 0.74 mg/dL). Liver & Pancreas: Recent Labs  Lab 06/16/21 0553 06/17/21 0015 06/18/21 1204  AST 138* 152* 215*  ALT 135* 146* 264*  ALKPHOS 154* 126 122  BILITOT 0.5 0.4 0.4  PROT 7.0 6.3* 7.2  ALBUMIN 2.4* 2.2* 2.6*   No results for input(s): LIPASE, AMYLASE in the last 168 hours. No results for input(s): AMMONIA in the last 168 hours. Diabetic: No results for input(s): HGBA1C in the last 72 hours. No results for input(s): GLUCAP in the last 168 hours. Cardiac Enzymes: Recent Labs  Lab 06/18/21 1204  CKTOTAL 38*   No results for input(s): PROBNP in  the last 8760 hours. Coagulation Profile: No results for input(s): INR, PROTIME in the last 168 hours. Thyroid Function Tests: No results for input(s): TSH, T4TOTAL, FREET4, T3FREE, THYROIDAB in the last 72 hours. Lipid Profile: No results for input(s): CHOL, HDL, LDLCALC, TRIG, CHOLHDL, LDLDIRECT in the last 72 hours. Anemia Panel: Recent Labs    06/17/21 0015 06/18/21 1204  FERRITIN 83 75   Urine analysis:    Component Value Date/Time   COLORURINE YELLOW 06/15/2021 2218   APPEARANCEUR TURBID (A) 06/15/2021 2218   LABSPEC 1.014 06/15/2021 2218   PHURINE 5.0 06/15/2021 2218   GLUCOSEU NEGATIVE 06/15/2021 2218   GLUCOSEU NEGATIVE 04/12/2021 1323   HGBUR SMALL (A) 06/15/2021 2218   BILIRUBINUR NEGATIVE 06/15/2021 2218   KETONESUR 5 (A) 06/15/2021 2218   PROTEINUR 100 (A) 06/15/2021 2218   UROBILINOGEN 0.2 04/12/2021 1323   NITRITE NEGATIVE 06/15/2021 2218   LEUKOCYTESUR MODERATE (A) 06/15/2021 2218   Sepsis Labs: Invalid input(s): PROCALCITONIN, LACTICIDVEN  Microbiology: Recent Results (from the past 240 hour(s))  MRSA Next Gen by PCR, Nasal     Status: Abnormal   Collection Time: 06/15/21 10:21 PM   Specimen: Nasal Mucosa; Nasal Swab  Result Value Ref Range Status   MRSA by PCR Next Gen DETECTED (A) NOT DETECTED Final    Comment: RESULT CALLED TO, READ BACK BY AND VERIFIED WITH: E,BLUE RN  06/16/21 EB (NOTE) The GeneXpert MRSA Assay (FDA approved for NASAL specimens only), is one component of a comprehensive MRSA colonization surveillance program. It is not intended to diagnose MRSA infection nor to guide or monitor treatment for MRSA infections. Test performance is not FDA approved in patients less than 2 years  old. Performed at [facility], [address] [place], [place] [address]   Resp Panel by RT-PCR (Flu A&B, Covid) Nasopharyngeal Swab     Status: Abnormal   Collection Time: 06/15/21 10:36 PM   Specimen: Nasopharyngeal Swab; Nasopharyngeal(NP)  swabs in vial transport medium  Result Value Ref Range Status   SARS Coronavirus 2 by RT PCR POSITIVE (A) NEGATIVE Final    Comment: RESULT CALLED TO, READ BACK BY AND VERIFIED WITH: RN EMMANUEL CASTRO BY MESSA H. AT 0110 ON 6 26 2022 (NOTE) SARS-CoV-2 target nucleic acids are DETECTED.  The SARS-CoV-2 RNA is generally detectable in upper respiratory specimens during the acute phase of infection. Positive results are indicative of the presence of the identified virus, but do not rule out bacterial infection or co-infection with other pathogens not detected by the test. Clinical correlation with patient history and other diagnostic information is necessary to determine patient infection status. The expected result is Negative.  Fact Sheet for Patients: BloggerCourse.com  Fact Sheet for Healthcare Providers: SeriousBroker.it  This test is not yet approved or cleared by the [place] FDA and  has been authorized for detection and/or diagnosis of SARS-CoV-2 by FDA under an Emergency Use Authorization (EUA).  This EUA will remain in effect (meanin g this test can be used) for the duration of  the COVID-19 declaration under Section 564(b)(1) of the Act, 21 U.S.C. section 360bbb-3(b)(1), unless the authorization is terminated or revoked sooner.     Influenza A by PCR NEGATIVE NEGATIVE Final   Influenza B by PCR NEGATIVE NEGATIVE Final    Comment: (NOTE) The Xpert Xpress SARS-CoV-2/FLU/RSV plus assay is intended as an aid in the diagnosis of influenza from Nasopharyngeal swab specimens and should not be used as a sole basis for treatment. Nasal washings and aspirates are unacceptable for Xpert Xpress SARS-CoV-2/FLU/RSV testing.  Fact Sheet for Patients: BloggerCourse.com  Fact Sheet for Healthcare Providers: SeriousBroker.it  This test is not yet approved or cleared by the  [place] FDA and has been authorized for detection and/or diagnosis of SARS-CoV-2 by FDA under an Emergency Use Authorization (EUA). This EUA will remain in effect (meaning this test can be used) for the duration of the COVID-19 declaration under Section 564(b)(1) of the Act, 21 U.S.C. section 360bbb-3(b)(1), unless the authorization is terminated or revoked.  Performed at [facility], [address] [place], [place] [address]   Culture, blood (routine x 2)     Status: None (Preliminary result)   Collection Time: 06/16/21  2:05 AM   Specimen: BLOOD LEFT HAND  Result Value Ref Range Status   Specimen Description BLOOD LEFT HAND  Final   Special Requests   Final    BOTTLES DRAWN AEROBIC ONLY Blood Culture adequate volume   Culture   Final    NO GROWTH 2 DAYS Performed at [facility], [address], [place], [place] [address]    Report Status PENDING  Incomplete  Culture, blood (routine x 2)     Status: None (Preliminary result)   Collection Time: 06/16/21  2:26 AM   Specimen: BLOOD RIGHT HAND  Result Value Ref Range Status   Specimen Description BLOOD RIGHT HAND  Final   Special Requests   Final    BOTTLES DRAWN AEROBIC ONLY Blood Culture adequate volume   Culture   Final    NO GROWTH 2 DAYS Performed at [facility], [address], [place], [place] [address]  Report Status PENDING  Incomplete  Culture, Urine     Status: Abnormal   Collection Time: 06/17/21  3:11 PM   Specimen: Urine, Random  Result Value Ref Range Status   Specimen Description URINE, RANDOM  Final   Special Requests   Final    NONE Performed at [facility], [address], [place], [place] [address]    Culture MULTIPLE SPECIES PRESENT, SUGGEST RECOLLECTION (A)  Final   Report Status 06/18/2021 FINAL  Final    Radiology Studies: No results found.    Nicholes Hibler T. Ziyana Morikawa [facility]  If 7PM-7AM, please contact night-coverage [email or web address] 06/18/2021, 4:03 PM

## 2021-06-18 NOTE — Progress Notes (Signed)
   06/18/21 0000  Urine Characteristics  Urinary Incontinence No  Urine Color Yellow/straw  Urine Appearance Clear  Bladder Scan Volume (mL) 639 mL   Patient denies pain or discomfort. No bladder distention noted. Patient voided 400 cc clear yellow. Will update MD.

## 2021-06-18 NOTE — Progress Notes (Signed)
   Patient readmitted with positive  COVID Right foot wound without change, Xeroform with dry guaze dressing changed at bedside Motor and sensation  intact  S/P Lysis right LE bypass 06/07/21, re-ck 06/08/21 Motor, sensation intact no change in wound appearance.   Mild right LE edema likely due to reperfusion. DVT study negative  Encouraged elevation of the right LE when at rest   Mosetta Pigeon PA-C

## 2021-06-18 NOTE — Plan of Care (Signed)
  Problem: Education: Goal: Knowledge of General Education information will improve Description: Including pain rating scale, medication(s)/side effects and non-pharmacologic comfort measures Outcome: Completed/Met   Problem: Clinical Measurements: Goal: Respiratory complications will improve Outcome: Completed/Met Goal: Cardiovascular complication will be avoided Outcome: Completed/Met   

## 2021-06-18 NOTE — Progress Notes (Signed)
Physical Therapy Treatment Patient Details Name: Lonnie [name] MRN: [id number] DOB: [birth year] Today's Date: 06/18/2021    History of Present Illness Lonnie [name] is a 70 y.o. male with progressive R foot/calf pain. Duplex revealed occlusion of right femoral to BK popliteal bypass. Acute ischemic skin changes of dorsum of right foot. Pt underwent peripheral vascular thrombectomy of R LE.    PT Comments    Pt pleasantly confused, agreeable to getting up to chair. Pt limited in safe mobility by R LE pain especially in dependent position. Pt is supervision for bed mobility, min A for transfers and modA for stepping from bed to chair 2 feet. PT continues to recommend SNF level rehab given pt mobility deficits.     Follow Up Recommendations  SNF;Supervision/Assistance - 24 hour;Other (comment) (Still would be better served by rehab before home, but if refuses SNF needs HHPT initial 24 hour assist for all mobility)     Equipment Recommendations  None recommended by PT;Other (comment) (BSC if family does not have one, per chart review they have RW, tub bench, ramp and manual wheelchair)    Recommendations for Other Services       Precautions / Restrictions Precautions Precautions: Fall Precaution Comments: painful foot especially in dependent position    Mobility  Bed Mobility Overal bed mobility: Needs Assistance Bed Mobility: Supine to Sit     Supine to sit: Supervision     General bed mobility comments: supervision for safety    Transfers Overall transfer level: Needs assistance Equipment used: Rolling walker (2 wheeled) Transfers: Sit to/from Stand Sit to Stand: Min assist;From elevated surface         General transfer comment: min A for power up and steadying, vc for hand placement for power up  Ambulation/Gait Ambulation/Gait assistance: +2 safety/equipment;Mod assist Gait Distance (Feet): 2 Feet Assistive device: Rolling walker (2 wheeled) Gait  Pattern/deviations: Step-to pattern;Step-through pattern;Decreased step length - left;Decreased stance time - right;Trunk flexed Gait velocity: slowed Gait velocity interpretation: <1.31 ft/sec, indicative of household ambulator General Gait Details: mod A for steadying due to increased pain with L foot weightbearing, vc for increased UE support for decreased weightbearing, difficulty with follow through         Balance Overall balance assessment: Needs assistance Sitting-balance support: No upper extremity supported;Feet supported Sitting balance-Leahy Scale: Good   Postural control: Posterior lean Standing balance support: Bilateral upper extremity supported;Single extremity supported;During functional activity Standing balance-Leahy Scale: Poor Standing balance comment: requires UE support, vc for upright posture                            Cognition Arousal/Alertness: Lethargic;Awake/alert Behavior During Therapy: WFL for tasks assessed/performed Overall Cognitive Status: No family/caregiver present to determine baseline cognitive functioning Area of Impairment: Attention;Memory;Problem solving;Awareness;Following commands;Safety/judgement                 Orientation Level: Disoriented to;Time (requires cuing) Current Attention Level: Sustained Memory: Decreased short-term memory (poor memory for hand placement) Following Commands: Follows one step commands inconsistently;Follows one step commands with increased time;Follows multi-step commands inconsistently;Follows multi-step commands with increased time Safety/Judgement: Decreased awareness of deficits;Decreased awareness of safety Awareness: Intellectual Problem Solving: Slow processing;Decreased initiation;Requires verbal cues;Difficulty sequencing;Requires tactile cues General Comments: hx of dementia, when talking about job and family looses track of what he is talking about      Exercises General  Exercises - Lower Extremity Ankle Circles/Pumps: AROM;Both;Seated;20 reps  General Comments General comments (skin integrity, edema, etc.): VSS on RA      Pertinent Vitals/Pain Pain Assessment: Faces Faces Pain Scale: Hurts even more Pain Location: R foot in dependent position Pain Descriptors / Indicators: Grimacing;Discomfort;Tender Pain Intervention(s): Monitored during session;Limited activity within patient's tolerance;Repositioned     PT Goals (current goals can now be found in the care plan section) Acute Rehab PT Goals Patient Stated Goal: go home PT Goal Formulation: With patient Time For Goal Achievement: 06/17/21 Progress towards PT goals: Progressing toward goals    Frequency    Min 3X/week      PT Plan Current plan remains appropriate       AM-PAC PT "6 Clicks" Mobility   Outcome Measure  Help needed turning from your back to your side while in a flat bed without using bedrails?: A Little Help needed moving from lying on your back to sitting on the side of a flat bed without using bedrails?: None Help needed moving to and from a bed to a chair (including a wheelchair)?: None Help needed standing up from a chair using your arms (e.g., wheelchair or bedside chair)?: A Lot Help needed to walk in hospital room?: A Lot Help needed climbing 3-5 steps with a railing? : Total 6 Click Score: 16    End of Session Equipment Utilized During Treatment: Gait belt Activity Tolerance: Patient limited by pain;Patient tolerated treatment well Patient left: in chair;with chair alarm set;with call bell/phone within reach;Other (comment) (heels floated) Nurse Communication: Mobility status PT Visit Diagnosis: Other abnormalities of gait and mobility (R26.89);Pain;Difficulty in walking, not elsewhere classified (R26.2) Pain - Right/Left: Right Pain - part of body: Leg     Time: 6834-1962 PT Time Calculation (min) (ACUTE ONLY): 28 min  Charges:  $Therapeutic  Exercise: 8-22 mins $Therapeutic Activity: 8-22 mins                     Zanyiah Posten B. Beverely Risen PT, DPT Acute Rehabilitation Services Pager [phone] Office [phone]    Elon Alas Fleet 06/18/2021, 1:38 PM

## 2021-06-18 NOTE — Progress Notes (Addendum)
[facility]   Reason for visit: Follow up on leg wound  Interval History: no acute events.  WBC stable at 20.3, remains afebrile.  Urine culture with multiple species, no specific growth.  Day 4 total antibiotics Day 2 cefepime  Physical Exam: Constitutional:  Vitals:   06/18/21 0559 06/18/21 0830  BP: (!) 155/84 (!) 150/85  Pulse: 63 68  Resp: 20 20  Temp: 98.2 F (36.8 C) 98.4 F (36.9 C)  SpO2: 97% 98%   patient appears in NAD Respiratory: Normal respiratory effort; CTA B Cardiovascular: RRR GI: soft, nt, nd  Review of Systems: Constitutional: negative for fevers and chills  Lab Results  Component Value Date   WBC 20.3 (H) 06/18/2021   HGB 8.5 (L) 06/18/2021   HCT 27.4 (L) 06/18/2021   MCV 70.6 (L) 06/18/2021   PLT 724 (H) 06/18/2021    Lab Results  Component Value Date   CREATININE 0.74 06/18/2021   BUN 18 06/18/2021   NA 134 (L) 06/18/2021   K 4.3 06/18/2021   CL 103 06/18/2021   CO2 21 (L) 06/18/2021    Lab Results  Component Value Date   ALT 264 (H) 06/18/2021   AST 215 (H) 06/18/2021   ALKPHOS 122 06/18/2021     Microbiology: Recent Results (from the past 240 hour(s))  MRSA Next Gen by PCR, Nasal     Status: Abnormal   Collection Time: 06/15/21 10:21 PM   Specimen: Nasal Mucosa; Nasal Swab  Result Value Ref Range Status   MRSA by PCR Next Gen DETECTED (A) NOT DETECTED Final    Comment: RESULT CALLED TO, READ BACK BY AND VERIFIED WITH: E,BLUE RN @1153  06/16/21 EB (NOTE) The GeneXpert MRSA Assay (FDA approved for NASAL specimens only), is one component of a comprehensive MRSA colonization surveillance program. It is not intended to diagnose MRSA infection nor to guide or monitor treatment for MRSA infections. Test performance is not FDA approved in patients less than 40 years old. Performed at [facility], [address], [place], [place]   Resp Panel by RT-PCR (Flu A&B, Covid) Nasopharyngeal  Swab     Status: Abnormal   Collection Time: 06/15/21 10:36 PM   Specimen: Nasopharyngeal Swab; Nasopharyngeal(NP) swabs in vial transport medium  Result Value Ref Range Status   SARS Coronavirus 2 by RT PCR POSITIVE (A) NEGATIVE Final    Comment: RESULT CALLED TO, READ BACK BY AND VERIFIED WITH: RN EMMANUEL CASTRO BY MESSA H. AT 0110 ON 6 26 2022 (NOTE) SARS-CoV-2 target nucleic acids are DETECTED.  The SARS-CoV-2 RNA is generally detectable in upper respiratory specimens during the acute phase of infection. Positive results are indicative of the presence of the identified virus, but do not rule out bacterial infection or co-infection with other pathogens not detected by the test. Clinical correlation with patient history and other diagnostic information is necessary to determine patient infection status. The expected result is Negative.  Fact Sheet for Patients: 2023  Fact Sheet for Healthcare Providers: BloggerCourse.com  This test is not yet approved or cleared by the [place] FDA and  has been authorized for detection and/or diagnosis of SARS-CoV-2 by FDA under an Emergency Use Authorization (EUA).  This EUA will remain in effect (meanin g this test can be used) for the duration of  the COVID-19 declaration under Section 564(b)(1) of the Act, 21 U.S.C. section 360bbb-3(b)(1), unless the authorization is terminated or revoked sooner.  Influenza A by PCR NEGATIVE NEGATIVE Final   Influenza B by PCR NEGATIVE NEGATIVE Final    Comment: (NOTE) The Xpert Xpress SARS-CoV-2/FLU/RSV plus assay is intended as an aid in the diagnosis of influenza from Nasopharyngeal swab specimens and should not be used as a sole basis for treatment. Nasal washings and aspirates are unacceptable for Xpert Xpress SARS-CoV-2/FLU/RSV testing.  Fact Sheet for Patients: BloggerCourse.com  Fact Sheet for  Healthcare Providers: SeriousBroker.it  This test is not yet approved or cleared by the [place] FDA and has been authorized for detection and/or diagnosis of SARS-CoV-2 by FDA under an Emergency Use Authorization (EUA). This EUA will remain in effect (meaning this test can be used) for the duration of the COVID-19 declaration under Section 564(b)(1) of the Act, 21 U.S.C. section 360bbb-3(b)(1), unless the authorization is terminated or revoked.  Performed at [facility], [address], [place], [place] [address]   Culture, blood (routine x 2)     Status: None (Preliminary result)   Collection Time: 06/16/21  2:05 AM   Specimen: BLOOD LEFT HAND  Result Value Ref Range Status   Specimen Description BLOOD LEFT HAND  Final   Special Requests   Final    BOTTLES DRAWN AEROBIC ONLY Blood Culture adequate volume   Culture   Final    NO GROWTH 2 DAYS Performed at [facility], [address], [place], [place] [address]    Report Status PENDING  Incomplete  Culture, blood (routine x 2)     Status: None (Preliminary result)   Collection Time: 06/16/21  2:26 AM   Specimen: BLOOD RIGHT HAND  Result Value Ref Range Status   Specimen Description BLOOD RIGHT HAND  Final   Special Requests   Final    BOTTLES DRAWN AEROBIC ONLY Blood Culture adequate volume   Culture   Final    NO GROWTH 2 DAYS Performed at [facility], [address], [place], [place] [address]    Report Status PENDING  Incomplete  Culture, Urine     Status: Abnormal   Collection Time: 06/17/21  3:11 PM   Specimen: Urine, Random  Result Value Ref Range Status   Specimen Description URINE, RANDOM  Final   Special Requests   Final    NONE Performed at [facility], [address] [place], [place] [address]    Culture MULTIPLE SPECIES PRESENT, SUGGEST RECOLLECTION (A)  Final   Report Status 06/18/2021 FINAL  Final    Impression/Plan:  1. Right Leg wound - no  signs of active infection and antibiotics have been stopped.  It is stable off of antibiotics.   2.  Urine retention - no particular symptoms of infection but UA with WBCs.  Urine culture noted and likely was sitting around so multiple species growing.  Unclear if infection but I will continue with cefepime through tomorrow am and stop.  Stop date placed.  He seems stable otherwise.    3.  Leukocytosis - it remains elevated but seems to be at his recent baseline.  Can continue to follow intermittently.    4.  COVID positive - largely asymptomatic and to get remdesivir 3 days.     I will sign off, call with questions

## 2021-06-19 DIAGNOSIS — K5900 Constipation, unspecified: Secondary | ICD-10-CM | POA: Diagnosis not present

## 2021-06-19 DIAGNOSIS — F17200 Nicotine dependence, unspecified, uncomplicated: Secondary | ICD-10-CM | POA: Diagnosis not present

## 2021-06-19 DIAGNOSIS — N179 Acute kidney failure, unspecified: Secondary | ICD-10-CM | POA: Diagnosis not present

## 2021-06-19 DIAGNOSIS — G9341 Metabolic encephalopathy: Secondary | ICD-10-CM | POA: Diagnosis not present

## 2021-06-19 LAB — CBC WITH DIFFERENTIAL/PLATELET
Abs Immature Granulocytes: 0.82 10*3/uL — ABNORMAL HIGH (ref 0.00–0.07)
Basophils Absolute: 0.1 10*3/uL (ref 0.0–0.1)
Basophils Relative: 0 %
Eosinophils Absolute: 0 10*3/uL (ref 0.0–0.5)
Eosinophils Relative: 0 %
HCT: 30.3 % — ABNORMAL LOW (ref 39.0–52.0)
Hemoglobin: 9.3 g/dL — ABNORMAL LOW (ref 13.0–17.0)
Immature Granulocytes: 2 %
Lymphocytes Relative: 4 %
Lymphs Abs: 1.3 10*3/uL (ref 0.7–4.0)
MCH: 22 pg — ABNORMAL LOW (ref 26.0–34.0)
MCHC: 30.7 g/dL (ref 30.0–36.0)
MCV: 71.6 fL — ABNORMAL LOW (ref 80.0–100.0)
Monocytes Absolute: 1.1 10*3/uL — ABNORMAL HIGH (ref 0.1–1.0)
Monocytes Relative: 3 %
Neutro Abs: 32.4 10*3/uL — ABNORMAL HIGH (ref 1.7–7.7)
Neutrophils Relative %: 91 %
Platelets: 870 10*3/uL — ABNORMAL HIGH (ref 150–400)
RBC: 4.23 MIL/uL (ref 4.22–5.81)
RDW: 20.2 % — ABNORMAL HIGH (ref 11.5–15.5)
Smear Review: NORMAL
WBC: 35.7 10*3/uL — ABNORMAL HIGH (ref 4.0–10.5)
nRBC: 0.1 % (ref 0.0–0.2)

## 2021-06-19 LAB — COMPREHENSIVE METABOLIC PANEL
ALT: 223 U/L — ABNORMAL HIGH (ref 0–44)
AST: 114 U/L — ABNORMAL HIGH (ref 15–41)
Albumin: 3 g/dL — ABNORMAL LOW (ref 3.5–5.0)
Alkaline Phosphatase: 135 U/L — ABNORMAL HIGH (ref 38–126)
Anion gap: 12 (ref 5–15)
BUN: 20 mg/dL (ref 8–23)
CO2: 21 mmol/L — ABNORMAL LOW (ref 22–32)
Calcium: 9.2 mg/dL (ref 8.9–10.3)
Chloride: 98 mmol/L (ref 98–111)
Creatinine, Ser: 0.86 mg/dL (ref 0.61–1.24)
GFR, Estimated: >60 mL/min (ref >60)
Glucose, Bld: 147 mg/dL — ABNORMAL HIGH (ref 70–99)
Potassium: 4.5 mmol/L (ref 3.5–5.1)
Sodium: 131 mmol/L — ABNORMAL LOW (ref 135–145)
Total Bilirubin: 0.6 mg/dL (ref 0.3–1.2)
Total Protein: 7.8 g/dL (ref 6.5–8.1)

## 2021-06-19 LAB — HEPATITIS PANEL, ACUTE
HCV Ab: NONREACTIVE
Hep A IgM: NONREACTIVE
Hep B C IgM: NONREACTIVE
Hepatitis B Surface Ag: NONREACTIVE

## 2021-06-19 LAB — D-DIMER, QUANTITATIVE: D-Dimer, Quant: 2.43 ug/mL-FEU — ABNORMAL HIGH (ref 0.00–0.50)

## 2021-06-19 LAB — PHOSPHORUS: Phosphorus: 3.5 mg/dL (ref 2.5–4.6)

## 2021-06-19 LAB — C-REACTIVE PROTEIN: CRP: 7.6 mg/dL — ABNORMAL HIGH (ref <1.0)

## 2021-06-19 LAB — HIV ANTIBODY (ROUTINE TESTING W REFLEX): HIV Screen 4th Generation wRfx: NONREACTIVE

## 2021-06-19 LAB — MAGNESIUM: Magnesium: 2 mg/dL (ref 1.7–2.4)

## 2021-06-19 LAB — FERRITIN: Ferritin: 62 ng/mL (ref 24–336)

## 2021-06-19 MED ORDER — CHLORHEXIDINE GLUCONATE CLOTH 2 % EX PADS
6.0000 | MEDICATED_PAD | Freq: Every day | CUTANEOUS | Status: AC
  Administered 2021-06-19 – 2021-06-24 (×4): 6 via TOPICAL

## 2021-06-19 MED ORDER — OXYCODONE HCL 5 MG PO TABS
5.0000 mg | ORAL_TABLET | Freq: Three times a day (TID) | ORAL | Status: DC | PRN
  Administered 2021-06-19: 5 mg via ORAL
  Filled 2021-06-19: qty 1

## 2021-06-19 MED ORDER — MELATONIN 5 MG PO TABS
10.0000 mg | ORAL_TABLET | Freq: Every day | ORAL | Status: DC
  Administered 2021-06-19 – 2021-07-12 (×24): 10 mg via ORAL
  Filled 2021-06-19 (×24): qty 2

## 2021-06-19 MED ORDER — TRAMADOL HCL 50 MG PO TABS
50.0000 mg | ORAL_TABLET | Freq: Two times a day (BID) | ORAL | Status: DC | PRN
  Administered 2021-06-19 – 2021-07-11 (×16): 50 mg via ORAL
  Filled 2021-06-19 (×18): qty 1

## 2021-06-19 MED ORDER — ACETAMINOPHEN 325 MG PO TABS
650.0000 mg | ORAL_TABLET | Freq: Four times a day (QID) | ORAL | Status: DC | PRN
  Administered 2021-06-19 – 2021-07-04 (×22): 650 mg via ORAL
  Filled 2021-06-19 (×21): qty 2

## 2021-06-19 MED ORDER — MUPIROCIN 2 % EX OINT
1.0000 "application " | TOPICAL_OINTMENT | Freq: Two times a day (BID) | CUTANEOUS | Status: AC
  Administered 2021-06-19 – 2021-06-24 (×10): 1 via NASAL
  Filled 2021-06-19 (×2): qty 22

## 2021-06-19 NOTE — Plan of Care (Signed)
  Problem: Activity: Goal: Risk for activity intolerance will decrease Outcome: Progressing   Problem: Coping: Goal: Level of anxiety will decrease Outcome: Progressing   Problem: Pain Managment: Goal: General experience of comfort will improve Outcome: Progressing   

## 2021-06-19 NOTE — Progress Notes (Addendum)
Progress Note    06/19/2021 12:53 PM * No surgery found *  Subjective: Apparently today at 5 am he began having increased swelling around right medial knee. He says this has occurred in the past but usually resolves with movement. It is  very painful.    Vitals:   06/18/21 2149 06/19/21 0902  BP: (!) 156/82 (!) 158/84  Pulse: (!) 58 63  Resp: 18 18  Temp: 98 F (36.7 C) 98.2 F (36.8 C)  SpO2: 99% 98%   Physical Exam: Cardiac:  regular Lungs:  non labored Extremities:  2+ right femoral pulse, RLE well perfused and warm. Right foot dressed. Fullness about right medial knee. No erythema or warmth. No overlying skin changes. Not pulsatile. Tender to palpation Neurologic:alert and oriented  CBC    Component Value Date/Time   WBC 20.3 (H) 06/18/2021 1204   RBC 3.88 (L) 06/18/2021 1204   HGB 8.5 (L) 06/18/2021 1204   HGB 15.7 01/10/2020 1146   HCT 27.4 (L) 06/18/2021 1204   HCT 46.1 01/10/2020 1146   PLT 724 (H) 06/18/2021 1204   PLT 232 01/10/2020 1146   MCV 70.6 (L) 06/18/2021 1204   MCV 83 01/10/2020 1146   MCH 21.9 (L) 06/18/2021 1204   MCHC 31.0 06/18/2021 1204   RDW 19.9 (H) 06/18/2021 1204   RDW 12.8 01/10/2020 1146   LYMPHSABS 1.6 06/18/2021 1204   LYMPHSABS 2.5 03/01/2015 1414   MONOABS 0.7 06/18/2021 1204   EOSABS 0.0 06/18/2021 1204   EOSABS 0.4 03/01/2015 1414   BASOSABS 0.1 06/18/2021 1204   BASOSABS 0.1 03/01/2015 1414    BMET    Component Value Date/Time   NA 134 (L) 06/18/2021 1204   NA 142 01/10/2020 1146   K 4.3 06/18/2021 1204   CL 103 06/18/2021 1204   CO2 21 (L) 06/18/2021 1204   GLUCOSE 125 (H) 06/18/2021 1204   BUN 18 06/18/2021 1204   BUN 19 01/10/2020 1146   CREATININE 0.74 06/18/2021 1204   CREATININE 0.94 05/24/2021 1549   CALCIUM 8.8 (L) 06/18/2021 1204   GFRNONAA >60 06/18/2021 1204   GFRAA >60 09/12/2020 0357    INR    Component Value Date/Time   INR 1.1 09/10/2020 0700     Intake/Output Summary (Last 24 hours) at  06/19/2021 1253 Last data filed at 06/19/2021 1100 Gross per 24 hour  Intake 540 ml  Output 3783 ml  Net -3243 ml      Assessment/Plan:  70 y.o. male is s/p lysis of RLE bypass propaten bypass graft on 06/07/21, lysis recheck on 6/18. Patient was subsequently readmitted with COVID and UTI. He has had leukocytosis and has been on Ceftriaxone. There has been no concern for foot infection. Foot wound remains unchanged. This morning he began having increased swelling and fullness about right knee. Increased pain. No overt signs of infection. Duplex shows extensive fluid collection about the popliteal fossa region. No connection to the bypass graft. This does not appear clinically to be infected. This is likely a seroma. Do not feel that any intervention is indicated at this time. On call vascular surgeon, Dr. Arbie Cookey will review imaging and provide further recommendations   [facility] [phone] 06/19/2021 12:53 PM  The patient had a study to rule out DVT which showed some fluid in the popliteal space.  In reviewing the arteriogram with lysis of his graft, there was some extravasation of the popliteal space to explain this.  No evidence of Baker's  cyst or ongoing extravasation.  Expectant treatment only.

## 2021-06-19 NOTE — Plan of Care (Signed)
Pt was admitted with generalized weakness, confusion and urinary retention. Pt was able to tolerate medications without complications. Pt vital signs were unremarkable and pt remains free of falls during the shift. Pt was bladder scanned during the shift and approximately of urine was noted. MD was paged and I&O catheter ordered. Procedure was carried out with charge nurse present. Pt tolerated procedure with over removed. Will continue to monitor.  Problem: Health Behavior/Discharge Planning: Goal: Ability to manage health-related needs will improve Outcome: Progressing   Problem: Clinical Measurements: Goal: Ability to maintain clinical measurements within normal limits will improve Outcome: Progressing Goal: Will remain free from infection Outcome: Progressing   Problem: Activity: Goal: Risk for activity intolerance will decrease Outcome: Progressing   Problem: Elimination: Goal: Will not experience complications related to bowel motility Outcome: Progressing   Problem: Safety: Goal: Ability to remain free from injury will improve Outcome: Progressing   Problem: Skin Integrity: Goal: Risk for impaired skin integrity will decrease Outcome: Progressing

## 2021-06-19 NOTE — NC FL2 (Signed)
[place] MEDICAID FL2 LEVEL OF CARE SCREENING TOOL     IDENTIFICATION  Patient Name: Lonnie [name] Birthdate: 07-20-1951 Sex: male Admission Date (Current Location): 06/15/2021  [facility] and [place] Number:  Producer, television/film/video and Address:  [facility], [address], [place], [place] [address]      Provider Number: [id number]  Attending Physician Name and Address:  Almon Hercules, MD  Relative Name and Phone Number:  Findley Vi - wife - [phone] and Daughter Johnnathan Hagemeister - [phone]    Current Level of Care: Hospital Recommended Level of Care: Skilled Nursing Facility Prior Approval Number:    Date Approved/Denied:   PASRR Number:  (Submitted for PASRR number 6/29 - NUST JS#[id number])  Discharge Plan: SNF    Current Diagnoses: Patient Active Problem List   Diagnosis Date Noted   AKI (acute kidney injury) (HCC) 06/15/2021   Ischemic cardiomyopathy    Sepsis secondary to UTI (HCC) 09/10/2020   Acute metabolic encephalopathy 09/10/2020   Closed fracture of right patella 09/10/2020   PAD (peripheral artery disease) (HCC) 01/20/2020   Fronto-temporal dementia (HCC) 08/04/2019   Left shoulder pain    TBI (traumatic brain injury) (HCC)    E-coli UTI    Urinary retention    Postoperative pain    Agitation    SDH (subdural hematoma) (HCC) 07/08/2019   Subdural hematoma (HCC) 06/30/2019   Erectile dysfunction 07/28/2018   Peripheral vascular disease (HCC) 07/16/2017   Anxiety and depression 07/16/2017   PCP NOTES >>>>>>>>>>>> 07/08/2016   Dermatitis 04/19/2015   Elevated LFTs 02/12/2015   Hyperglycemia 09/01/2014   DJD (degenerative joint disease) 09/01/2014   Annual physical exam 09/01/2014   S/P CABG x 4 11/07/2013   HTN (hypertension) 10/29/2013   Tobacco abuse 10/29/2013   Coronary atherosclerosis of native coronary artery 10/29/2013    Orientation RESPIRATION BLADDER Height & Weight     Self, Time,  Situation, Place  Normal Continent (Urethral catheter placed 6/29) Weight: 187 lb 2.7 oz (84.9 kg) Height:  6\' 2"  (188 cm)  BEHAVIORAL SYMPTOMS/MOOD NEUROLOGICAL BOWEL NUTRITION STATUS      Continent Diet (Heart healthy diet)  AMBULATORY STATUS COMMUNICATION OF NEEDS Skin   Extensive Assist (Mod Assist (+2)) Verbally Other (Comment) (Full thickness wound on right foot-xerofoam gauze and top with dry dressing, ABD & secure w/ACE bandage; Incision left groin; Venous status right leg ulcer)                       Personal Care Assistance Level of Assistance  Bathing, Feeding, Dressing Bathing Assistance: Maximum assistance (Upper body assistance with set-up; Lower boddy Max assist) Feeding assistance: Limited assistance (Assistance with set-up) Dressing Assistance: Maximum assistance (Upper body Min Assist; Lower body Max Assist)     Functional Limitations Info  Sight, Hearing, Speech Sight Info: Adequate Hearing Info: Adequate Speech Info: Adequate    SPECIAL CARE FACTORS FREQUENCY  PT (By licensed PT), OT (By licensed OT)     PT Frequency: Evaluated 6/26. PT at SNF eval and treat a minimum of 5 days per week OT Frequency: Evaluated 6/26. OT at SNF eval and treat a minimum of 5 days per week            Contractures Contractures Info: Not present    Additional Factors Info  Code Status, Allergies, Isolation Precautions Code Status Info: DNR Allergies Info: Ace inhibitors, Eggs or egg derived products, Lactose intolerance, Latex  Isolation Precautions Info: MRSA, ESBL, MDRO or VRE infection (Orange contact precautions)     Current Medications (06/19/2021):  This is the current hospital active medication list Current Facility-Administered Medications  Medication Dose Route Frequency Provider Last Rate Last Admin   acetaminophen (TYLENOL) tablet 650 mg  650 mg Oral Q6H PRN Candelaria Stagers T, MD   650 mg at 06/19/21 1710   albuterol (VENTOLIN HFA) 108 (90 Base) MCG/ACT  inhaler 2 puff  2 puff Inhalation Q4H PRN Bobette Mo, MD       aspirin EC tablet 81 mg  81 mg Oral Daily Dow Adolph N, DO   81 mg at 06/19/21 4163   bethanechol (URECHOLINE) tablet 10 mg  10 mg Oral TID Candelaria Stagers T, MD   10 mg at 06/19/21 1445   Chlorhexidine Gluconate Cloth 2 % PADS 6 each  6 each Topical Daily Candelaria Stagers T, MD   6 each at 06/19/21 1000   citalopram (CELEXA) tablet 20 mg  20 mg Oral Daily Pham, Minh Q, RPH-CPP   20 mg at 06/19/21 0956   clonazePAM (KLONOPIN) disintegrating tablet 0.25 mg  0.25 mg Oral BID PRN Dow Adolph N, DO       clopidogrel (PLAVIX) tablet 75 mg  75 mg Oral Daily Candelaria Stagers T, MD   75 mg at 06/19/21 0955   enoxaparin (LOVENOX) injection 40 mg  40 mg Subcutaneous Q24H Dow Adolph N, DO   40 mg at 06/18/21 2146   ferrous fumarate-b12-vitamic C-folic acid (TRINSICON / FOLTRIN) capsule 1 capsule  1 capsule Oral Daily Pierce, Dwayne A, RPH   1 capsule at 06/19/21 0956   furosemide (LASIX) tablet 20 mg  20 mg Oral Daily Candelaria Stagers T, MD   20 mg at 06/19/21 0955   gabapentin (NEURONTIN) capsule 100 mg  100 mg Oral BID Dow Adolph N, DO   100 mg at 06/19/21 0956   guaiFENesin-dextromethorphan (ROBITUSSIN DM) 100-10 MG/5ML syrup 10 mL  10 mL Oral Q4H PRN Bobette Mo, MD       irbesartan (AVAPRO) tablet 75 mg  75 mg Oral Daily Candelaria Stagers T, MD   75 mg at 06/19/21 0955   melatonin tablet 10 mg  10 mg Oral QHS Candelaria Stagers T, MD       metoprolol tartrate (LOPRESSOR) tablet 12.5 mg  12.5 mg Oral BID Dow Adolph N, DO   12.5 mg at 06/19/21 8453   multivitamin with minerals tablet 1 tablet  1 tablet Oral Daily Candelaria Stagers T, MD   1 tablet at 06/19/21 0956   nicotine (NICODERM CQ - dosed in mg/24 hr) patch 7 mg  7 mg Transdermal Daily Dow Adolph N, DO   7 mg at 06/19/21 6468   oxyCODONE (Oxy IR/ROXICODONE) immediate release tablet 5 mg  5 mg Oral Q8H PRN Candelaria Stagers T, MD   5 mg at 06/19/21 1517   pantoprazole (PROTONIX) EC tablet 40 mg  40 mg  Oral QPM Dow Adolph N, DO   40 mg at 06/19/21 1712   polyethylene glycol (MIRALAX / GLYCOLAX) packet 17 g  17 g Oral BID PRN Almon Hercules, MD       prochlorperazine (COMPAZINE) injection 10 mg  10 mg Intravenous Q6H PRN Hall, Carole N, DO       QUEtiapine (SEROQUEL) tablet 200 mg  200 mg Oral BID Dow Adolph N, DO   200 mg at 06/19/21 0955   senna-docusate (Senokot-S) tablet 1 tablet  1 tablet Oral BID Candelaria Stagers T, MD   1 tablet at 06/19/21 0955   sodium chloride flush (NS) 0.9 % injection 3 mL  3 mL Intravenous Once Arby Barrette, MD       tamsulosin (FLOMAX) capsule 0.8 mg  0.8 mg Oral QPM Hall, Carole N, DO   0.8 mg at 06/19/21 1711   traMADol (ULTRAM) tablet 50 mg  50 mg Oral Q12H PRN Almon Hercules, MD   50 mg at 06/19/21 1036     Discharge Medications: Please see discharge summary for a list of discharge medications.  Relevant Imaging Results:  Relevant Lab Results:   Additional Information ss#[id number].  Patient test positive for COVID on 06/16/21. Patient vaccinated for COVID: 02/16/20, 03/13/20, 11/04/20  Nova Evett, Lazaro Arms, LCSW

## 2021-06-19 NOTE — TOC Progression Note (Addendum)
Transition of Care [facility]) - Progression Note    Patient Details  Name: Lonnie [name] MRN: [id number] Date of Birth: 1951/05/10  Transition of Care [facility]) CM/SW Contact  Okey Dupre Lazaro Arms, LCSW Phone Number: 06/19/2021, 2:40 PM  Clinical Narrative:  Received a call from patient's wife, Anne Hahn (12:38 pm) - [phone] regarding her husband his discharge plan. She did want SNF and the [facility], however she was not pleased with their ratings. Home health services requested to include PT, OT and RN for the wound on her husband's foot. Mrs. Legore advised that a nurse case manager will be in touch with her regarding HH needs at discharge.  3:47 pm: CSW received a call from patient's daughter Loraine Maple [phone]) regarding her dad's discharge plan. She acknowledged that her mom did call earlier today regarding her dad coming home, but they have talked to each other and the doctor and her mom realizes that he needs more care than they can provide. Ms. Reuel Boom added that they both have health issues that would prevent them from caring for their dad at home. Daughter advised that the facility search will proceed and they will be updated later regarding facility responses.   4:36 pm: Received call from patient's wife regarding his discharge disposition. She really wants him home but indicated that she and her daughter have health issues that would impede their ability to care for him at home. CSW expressed understanding and agreement with her decision.    Barriers to Discharge: Continued Medical Work up  Expected Discharge Plan and Services  Skilled nursing facility - preference [facility]                                               Social Determinants of Health (SDOH) Interventions  None needed or requested at this time.  Readmission Risk Interventions Readmission Risk Prevention Plan 06/14/2021 09/12/2020 03/12/2020  Post Dischage  Appt Complete - -  Medication Screening Complete - -  Transportation Screening Complete Complete -  PCP or Specialist Appt within 3-5 Days - Complete -  HRI or Home Care Consult - Complete -  Social Work Consult for Recovery Care Planning/Counseling - Complete -  Palliative Care Screening - Not Applicable -  Medication Review Oceanographer) - Complete Complete  Some recent data might be hidden

## 2021-06-19 NOTE — Progress Notes (Signed)
PROGRESS NOTE  Lonnie [name] PTW:[id number] DOB: 1951/09/30   PCP: [name], MD  Patient is from: Home  DOA: 06/15/2021 LOS: 3  Chief complaints:  Confusion, generalized weakness and urinary retention  Brief Narrative / Interim history: 70 year old M with PMH of dementia, PAD s/p mechanical thrombectomy and stenting of occluded femoral popliteal bypass on 6/18, ongoing tobacco use, HTN, HLD, BPH, GERD, anxiety and depression returning with confusion, generalized weakness and urinary retention.  Patient was discharged to SNF and went home from SNF on 06/14/2021.    In ED, RLQ drain in situ with bladder scan showing 900 cc, AKI and possible UTI and right foot infection.  Hemodynamically stable.  Mild temp to 99.1.  Initially saturating at 99% on RA. Cr 1.49 (baseline 0.91).  COVID-19 PCR returned positive.  Patient was started on ceftriaxone for UTI and Zyvox for possible right foot infection, and Decadron and remdesivir for COVID-19 infection with desaturation to 93% on RA.  Vascular surgery and ID consulted.  No concern for foot infection at this time.  ID recommended IV cefepime for UTI given prior history of Pseudomonas.  Completed 3 days of remdesivir.  Remains on Decadron.  Encephalopathy and urinary tension resolved.  Therapy recommended SNF.  He could be ready if LFT improves.  Subjective: Seen and examined earlier this morning.  No major events overnight of this morning.  Complaining of pain about his right knee medially.  He rates his pain 10/10.  Has some swelling/effusion inferior and medial to his right knee and in his popliteal region.  No overlying skin erythema.  Objective: Vitals:   06/18/21 0559 06/18/21 0830 06/18/21 2149 06/19/21 0902  BP: (!) 155/84 (!) 150/85 (!) 156/82 (!) 158/84  Pulse: 63 68 (!) 58 63  Resp: 20 20 18 18   Temp: 98.2 F (36.8 C) 98.4 F (36.9 C) 98 F (36.7 C) 98.2 F (36.8 C)  TempSrc: Oral Oral Oral Oral  SpO2: 97% 98% 99% 98%  Weight:       Height:        Intake/Output Summary (Last 24 hours) at 06/19/2021 1159 Last data filed at 06/19/2021 1100 Gross per 24 hour  Intake 840 ml  Output 4183 ml  Net -3343 ml   Filed Weights   06/16/21 0300  Weight: 84.9 kg    Examination:  GENERAL: No apparent distress.  Nontoxic. HEENT: MMM.  Vision and hearing grossly intact.  NECK: Supple.  No apparent JVD.  RESP: On RA.  No IWOB.  Fair aeration bilaterally. CVS:  RRR. Heart sounds normal.  ABD/GI/GU: BS+. Abd soft, NTND.  MSK/EXT:  Moves extremities.  RLE edema.  Swelling about his right knee as below.  SKIN: Swelling/effusion inferior medial to his right knee, just proximal to old surgical scar, and in his right popliteal fossa.  Skin exfoliation/wound over dorsal aspect of right foot NEURO: Awake and alert. Oriented x4.  No apparent focal neuro deficit. PSYCH: Calm. Normal affect.   Procedures:  None  Microbiology summarized: COVID-19 PCR positive. MRSA PCR screen positive. Blood cultures NGTD. Urine culture with multiple species.  Assessment & Plan: AKI/azotemia: Likely mix of prerenal and postobstructive. He is also on Lasix and Avapro which could contribute.  Resolved. Recent Labs    09/12/20 0357 09/26/20 1515 05/24/21 1549 06/07/21 1025 06/08/21 0317 06/10/21 0249 06/15/21 1809 06/16/21 0553 06/17/21 0015 06/18/21 1204  BUN 14 17 17 19 18 18  32* 23 16 18   CREATININE 1.04 1.16 0.94 1.00 0.98 0.91  1.49* 0.97 0.87 0.74  -Passed voiding trial.  Excellent urine output so far. -Continue monitoring  Right foot ulcer/wound-low suspicion for foot infection by all experts PAD s/p recent mechanical thrombectomy and stent of occluded femoropopliteal bypass Right lower extremity pain-swelling/effusion just inferior and medial to his right knee and in popliteal fossa-ruptured Baker's cyst?  LE [place] on 6/27 showed fluid collection surrounding stent at popliteal fossa. -Wound seems to be about the same but  significant edema in right leg. -Zyvox>> vancomycin>> off per ID.  Low suspicion for foot infection. -Appreciate recommendation by infectious disease-discontinued antibiotics. -Tramadol and fentanyl as needed moderate and severe pain respectively -Reconsulted vascular surgery about pain and fluid collection about his right knee -Appreciate help by WOCN  Acute metabolic encephalopathy-could be from UTI, COVID-19 infection, polypharmacy, delirium in the setting of dementia.  Resolved.  Oriented x4. -Treat treatable causes -Reorientation and delirium precautions -May have to cut down on his Seroquel, gabapentin and Klonopin if worse.   Acute COVID-19 pneumonia: symptomatic for 1 day. Tested positive on 6/25.  Desaturated to 93% on RA.  CXR with increased patchy opacities RIGHT lung base. Cardiomegaly and mild diffuse pulmonary interstitial congestion. CRP 29. Now saturating at 98% on RA.  Recent Labs    06/17/21 0015 06/18/21 1204  DDIMER 1.88* 2.36*  FERRITIN 83 75  CRP 21.4* 10.1*  -Completed 3 days of remdesivir -Stop Decadron. -Subcu Lovenox for VTE prophylaxis. -Protonix for GI prophylaxis -Monitor inflammatory markers.   Elevated D-dimer: Likely due to COVID infection.  Low suspicion for PE.  LE [place] negative for DVT.   Acute urinary retention: Resolved.  -Continue Flomax and bethanechol. -Manage constipation as below -Monitor urine output  Presumptive UTI: UA with moderate pyuria and bacteriuria.  Urine culture with multiple species.  -Escalated from IV CTX to IV cefepime by ID given history of Pseudomonas.  Last dose on 6/29  Elevated liver enzymes: could be due to COVID-19 infection, remdesivir and a/or statin.  CK within normal.  RUQ [place] suggestive for hepatic steatosis otherwise negative.  Morning labs were not drawn yet. -Off remdesivir.  -Discontinued statin -Acute hepatitis panel, HIV pending -Continue trending   ICM/chronic combined CHF: TTE in 2018 with LVEF of 40  to 45%, G1-DD.  Appears to have significant edema in RLE likely from recent revascularization.  However, CXR with some interstitial venous congestion.  BNP 365 but no prior to compare to.  4.3 L UOP/24 hours.  Net -6 L so far.  Renal function stable. -Continue home Lasix. -Monitor fluid and respiratory status  History of CAD s/p CABG: Stable. -Continue Lopressor, aspirin, Plavix   Chronic anxiety/depression: Stable -Continue home meds   Essential hypertension: BP slightly elevated.  Could be due to pain. -Pain control as above -Continue home Lasix and low-dose Avapro   Hyperlipidemia -Holding Lipitor due to elevated liver enzymes   Iron deficiency anemia/ anemia of chronic disease: Hgb dropped about 2 g since admission.  Seems to have dark stool but no hematochezia Recent Labs    06/08/21 0317 06/08/21 1435 06/09/21 1055 06/09/21 1828 06/10/21 0249 06/15/21 1809 06/16/21 0553 06/17/21 0015 06/17/21 1133 06/18/21 1204  HGB 7.6* 7.5* 5.8* 8.7* 8.6* 8.9* 8.4* 7.1* 8.0* 8.5*  -H&H stable. -Goal Hgb >8.0 given history of CAD  GERD -Continue PPI   Generalized weakness: Ambulates using walker at baseline. -PT/OT-recommended SNF   Tobacco use disorder: smokes 2 to 3 cigarettes/day -Counseled family. -Nicotine patch  Leukocytosis/bandemia-likely demargination from steroid. -Continue monitoring  Constipation:  seems to have resolved. -Senokot-S twice daily  -MiraLAX twice daily as needed  Goal of care counseling: Discussed with patient's daughter, [name].  [name] says patient has DNR and DNI. I have changed CODE STATUS to DNR/DNI and notify patient's RN.  Body mass index is 24.03 kg/m.         DVT prophylaxis:  enoxaparin (LOVENOX) injection 40 mg Start: 06/15/21 2200 SCDs Start: 06/15/21 2150  Code Status: DNR/DNI Family Communication: Patient and/or RN.  Updated patient's daughter and wife over the phone. Level of care: Telemetry Medical Status is:  Inpatient  Remains inpatient appropriate because:Ongoing diagnostic testing needed not appropriate for outpatient work up, Unsafe d/c plan, IV treatments appropriate due to intensity of illness or inability to take PO, and Inpatient level of care appropriate due to severity of illness  Dispo:  Patient From: Home  Planned Disposition: Skilled Nursing Facility  Medically stable for discharge: No       Consultants:  Vascular surgery Infectious disease   Sch Meds:  Scheduled Meds:  aspirin EC  81 mg Oral Daily   bethanechol  10 mg Oral TID   Chlorhexidine Gluconate Cloth  6 each Topical Daily   citalopram  20 mg Oral Daily   clopidogrel  75 mg Oral Daily   dexamethasone (DECADRON) injection  6 mg Intravenous Q24H   enoxaparin (LOVENOX) injection  40 mg Subcutaneous Q24H   ferrous fumarate-b12-vitamic C-folic acid  1 capsule Oral Daily   furosemide  20 mg Oral Daily   gabapentin  100 mg Oral BID   irbesartan  75 mg Oral Daily   melatonin  10 mg Oral QHS   metoprolol tartrate  12.5 mg Oral BID   multivitamin with minerals  1 tablet Oral Daily   nicotine  7 mg Transdermal Daily   pantoprazole  40 mg Oral QPM   QUEtiapine  200 mg Oral BID   senna-docusate  1 tablet Oral BID   sodium chloride flush  3 mL Intravenous Once   tamsulosin  0.8 mg Oral QPM   Continuous Infusions:   PRN Meds:.acetaminophen, albuterol, clonazePAM, guaiFENesin-dextromethorphan, oxyCODONE, polyethylene glycol, prochlorperazine, traMADol  Antimicrobials: Anti-infectives (From admission, onward)    Start     Dose/Rate Route Frequency Ordered Stop   06/17/21 1400  ceFEPIme (MAXIPIME) 2 g in sodium chloride 0.9 % 100 mL IVPB        2 g 200 mL/hr over 30 Minutes Intravenous Every 12 hours 06/17/21 1258 06/19/21 0955   06/17/21 1000  remdesivir 100 mg in sodium chloride 0.9 % 100 mL IVPB       See Hyperspace for full Linked Orders Report.   100 mg 200 mL/hr over 30 Minutes Intravenous Daily 06/16/21  0135 06/18/21 1026   06/16/21 2200  cefTRIAXone (ROCEPHIN) 2 g in sodium chloride 0.9 % 100 mL IVPB  Status:  Discontinued        2 g 200 mL/hr over 30 Minutes Intravenous Every 24 hours 06/15/21 2304 06/17/21 1218   06/16/21 2200  vancomycin (VANCOREADY) IVPB 1000 mg/200 mL  Status:  Discontinued        1,000 mg 200 mL/hr over 60 Minutes Intravenous Every 12 hours 06/16/21 0820 06/17/21 1218   06/16/21 0915  vancomycin (VANCOREADY) IVPB 1750 mg/350 mL        1,750 mg 175 mL/hr over 120 Minutes Intravenous  Once 06/16/21 0820 06/16/21 1114   06/16/21 0230  remdesivir 200 mg in sodium chloride 0.9% 250 mL IVPB  See Hyperspace for full Linked Orders Report.   200 mg 580 mL/hr over 30 Minutes Intravenous Once 06/16/21 0135 06/16/21 0324   06/15/21 2230  linezolid (ZYVOX) IVPB 600 mg  Status:  Discontinued        600 mg 300 mL/hr over 60 Minutes Intravenous Every 12 hours 06/15/21 2228 06/16/21 0811   06/15/21 2115  cefTRIAXone (ROCEPHIN) 2 g in sodium chloride 0.9 % 100 mL IVPB        2 g 200 mL/hr over 30 Minutes Intravenous  Once 06/15/21 2114 06/15/21 2328        I have personally reviewed the following labs and images: CBC: Recent Labs  Lab 06/15/21 1809 06/16/21 0553 06/17/21 0015 06/17/21 1133 06/18/21 1204  WBC 21.1* 17.0* 19.7* 20.9* 20.3*  NEUTROABS  --  13.8* 16.1*  --  17.2*  HGB 8.9* 8.4* 7.1* 8.0* 8.5*  HCT 29.3* 27.2* 23.1* 25.8* 27.4*  MCV 74.0* 71.8* 71.1* 71.7* 70.6*  PLT 479* 486* 510* 572* 724*   BMP &GFR Recent Labs  Lab 06/15/21 1809 06/16/21 0553 06/17/21 0015 06/18/21 1204  NA 134* 136 135 134*  K 4.1 3.8 4.0 4.3  CL 101 105 105 103  CO2 20* 22 20* 21*  GLUCOSE 129* 111* 119* 125*  BUN 32* 23 16 18   CREATININE 1.49* 0.97 0.87 0.74  CALCIUM 9.1 8.5* 8.1* 8.8*  MG  --  2.2 2.1 2.0  PHOS  --  3.3 2.3* 2.8   Estimated Creatinine Clearance: 102.8 mL/min (by C-G formula based on SCr of 0.74 mg/dL). Liver & Pancreas: Recent Labs  Lab  06/16/21 0553 06/17/21 0015 06/18/21 1204  AST 138* 152* 215*  ALT 135* 146* 264*  ALKPHOS 154* 126 122  BILITOT 0.5 0.4 0.4  PROT 7.0 6.3* 7.2  ALBUMIN 2.4* 2.2* 2.6*   No results for input(s): LIPASE, AMYLASE in the last 168 hours. No results for input(s): AMMONIA in the last 168 hours. Diabetic: No results for input(s): HGBA1C in the last 72 hours. No results for input(s): GLUCAP in the last 168 hours. Cardiac Enzymes: Recent Labs  Lab 06/18/21 1204  CKTOTAL 38*   No results for input(s): PROBNP in the last 8760 hours. Coagulation Profile: No results for input(s): INR, PROTIME in the last 168 hours. Thyroid Function Tests: No results for input(s): TSH, T4TOTAL, FREET4, T3FREE, THYROIDAB in the last 72 hours. Lipid Profile: No results for input(s): CHOL, HDL, LDLCALC, TRIG, CHOLHDL, LDLDIRECT in the last 72 hours. Anemia Panel: Recent Labs    06/17/21 0015 06/18/21 1204  FERRITIN 83 75   Urine analysis:    Component Value Date/Time   COLORURINE YELLOW 06/15/2021 2218   APPEARANCEUR TURBID (A) 06/15/2021 2218   LABSPEC 1.014 06/15/2021 2218   PHURINE 5.0 06/15/2021 2218   GLUCOSEU NEGATIVE 06/15/2021 2218   GLUCOSEU NEGATIVE 04/12/2021 1323   HGBUR SMALL (A) 06/15/2021 2218   BILIRUBINUR NEGATIVE 06/15/2021 2218   KETONESUR 5 (A) 06/15/2021 2218   PROTEINUR 100 (A) 06/15/2021 2218   UROBILINOGEN 0.2 04/12/2021 1323   NITRITE NEGATIVE 06/15/2021 2218   LEUKOCYTESUR MODERATE (A) 06/15/2021 2218   Sepsis Labs: Invalid input(s): PROCALCITONIN, LACTICIDVEN  Microbiology: Recent Results (from the past 240 hour(s))  MRSA Next Gen by PCR, Nasal     Status: Abnormal   Collection Time: 06/15/21 10:21 PM   Specimen: Nasal Mucosa; Nasal Swab  Result Value Ref Range Status   MRSA by PCR Next Gen DETECTED (A) NOT DETECTED Final  Comment: RESULT CALLED TO, READ BACK BY AND VERIFIED WITH: E,BLUE RN @1153  06/16/21 EB (NOTE) The GeneXpert MRSA Assay (FDA approved for  NASAL specimens only), is one component of a comprehensive MRSA colonization surveillance program. It is not intended to diagnose MRSA infection nor to guide or monitor treatment for MRSA infections. Test performance is not FDA approved in patients less than 58 years old. Performed at [facility], [address], [place], [place]   Resp Panel by RT-PCR (Flu A&B, Covid) Nasopharyngeal Swab     Status: Abnormal   Collection Time: 06/15/21 10:36 PM   Specimen: Nasopharyngeal Swab; Nasopharyngeal(NP) swabs in vial transport medium  Result Value Ref Range Status   SARS Coronavirus 2 by RT PCR POSITIVE (A) NEGATIVE Final    Comment: RESULT CALLED TO, READ BACK BY AND VERIFIED WITH: RN EMMANUEL CASTRO BY MESSA H. AT 0110 ON 6 26 2022 (NOTE) SARS-CoV-2 target nucleic acids are DETECTED.  The SARS-CoV-2 RNA is generally detectable in upper respiratory specimens during the acute phase of infection. Positive results are indicative of the presence of the identified virus, but do not rule out bacterial infection or co-infection with other pathogens not detected by the test. Clinical correlation with patient history and other diagnostic information is necessary to determine patient infection status. The expected result is Negative.  Fact Sheet for Patients: 2023  Fact Sheet for Healthcare Providers: BloggerCourse.com  This test is not yet approved or cleared by the [employer]iousBroker.[place] FDA and  has been authorized for detection and/or diagnosis of SARS-CoV-2 by FDA under an Emergency Use Authorization (EUA).  This EUA will remain in effect (meanin g this test can be used) for the duration of  the COVID-19 declaration under Section 564(b)(1) of the Act, 21 U.S.C. section 360bbb-3(b)(1), unless the authorization is terminated or revoked sooner.     Influenza A by PCR NEGATIVE NEGATIVE Final   Influenza B by PCR NEGATIVE  NEGATIVE Final    Comment: (NOTE) The Xpert Xpress SARS-CoV-2/FLU/RSV plus assay is intended as an aid in the diagnosis of influenza from Nasopharyngeal swab specimens and should not be used as a sole basis for treatment. Nasal washings and aspirates are unacceptable for Xpert Xpress SARS-CoV-2/FLU/RSV testing.  Fact Sheet for Patients: [place]  Fact Sheet for Healthcare Providers: BloggerCourse.com  This test is not yet approved or cleared by the SeriousBroker.it FDA and has been authorized for detection and/or diagnosis of SARS-CoV-2 by FDA under an Emergency Use Authorization (EUA). This EUA will remain in effect (meaning this test can be used) for the duration of the COVID-19 declaration under Section 564(b)(1) of the Act, 21 U.S.C. section 360bbb-3(b)(1), unless the authorization is terminated or revoked.  Performed at [facility], [address] [place], [place]   Culture, blood (routine x 2)     Status: None (Preliminary result)   Collection Time: 06/16/21  2:05 AM   Specimen: BLOOD LEFT HAND  Result Value Ref Range Status   Specimen Description BLOOD LEFT HAND  Final   Special Requests   Final    BOTTLES DRAWN AEROBIC ONLY Blood Culture adequate volume   Culture   Final    NO GROWTH 3 DAYS Performed at [facility], [address], [place], [place]    Report Status PENDING  Incomplete  Culture, blood (routine x 2)     Status: None (Preliminary result)   Collection Time: 06/16/21  2:26 AM  Specimen: BLOOD RIGHT HAND  Result Value Ref Range Status   Specimen Description BLOOD RIGHT HAND  Final   Special Requests   Final    BOTTLES DRAWN AEROBIC ONLY Blood Culture adequate volume   Culture   Final    NO GROWTH 3 DAYS Performed at [facility], [address], [place], [place] [address]    Report Status PENDING  Incomplete  Culture, Urine     Status: Abnormal    Collection Time: 06/17/21  3:11 PM   Specimen: Urine, Random  Result Value Ref Range Status   Specimen Description URINE, RANDOM  Final   Special Requests   Final    NONE Performed at [facility], [address] [place], [place] [address]    Culture MULTIPLE SPECIES PRESENT, SUGGEST RECOLLECTION (A)  Final   Report Status 06/18/2021 FINAL  Final    Radiology Studies: US ABDOMEN LIMITED RUQ (LIVER/GB)  Result Date: 06/18/2021 CLINICAL DATA:  Elevated LFT EXAM: ULTRASOUND ABDOMEN LIMITED RIGHT UPPER QUADRANT COMPARISON:  CT 09/09/2020 FINDINGS: Gallbladder: No gallstones or wall thickening visualized. No sonographic Murphy sign noted by sonographer. Common bile duct: Diameter: 5.9 mm Liver: Liver appears slightly echogenic. No focal hepatic abnormality. Portal vein is patent on color Doppler imaging with normal direction of blood flow towards the liver. Other: None. IMPRESSION: 1. Negative for gallstones. 2. Liver appears slightly echogenic suggesting steatosis Electronically Signed   By: Jasmine Pang M.D.   On: 06/18/2021 19:34      Cailee Blanke T. Amad Mau [facility]  If 7PM-7AM, please contact night-coverage [email or web address] 06/19/2021, 11:59 AM

## 2021-06-19 NOTE — Clinical Social Work Note (Signed)
  06/19/21   RE: Lonnie [name] DATE OF BIRTH: 11-30-2051 DATE: 06/19/21   TO WHOM IT MAY CONCERN:  Please be advised that the above-named patient has a primary diagnosis of dementia which supersedes any psychiatric diagnosis.

## 2021-06-19 NOTE — Clinical Social Work Note (Signed)
  06/19/21  RE: Lonnie [name] DATE OF BIRTH: 2051/06/16 DATE: 06/19/21  TO WHOM IT MAY CONCERN: Please be advised that the above-named patient will require a short-term nursing home stay - anticipated 30 days or less for rehabilitation and strengthening. The plan is for return home.

## 2021-06-19 NOTE — TOC Initial Note (Signed)
Transition of Care [facility]) - Initial/Assessment Note    Patient Details  Name: Lonnie [name] MRN: [id number] Date of Birth: 1951-04-27  Transition of Care [facility]) CM/SW Contact:    Lawerance Sabal, RN Phone Number: 06/19/2021, 10:48 AM  Clinical Narrative:      Patient recently DC'd 6/24 with DME and [facility] set up w [facility], they had not visited prior to patient's return to the hospital.  Patient noted to have some confusion, as well as COVID+, called spouse, no answer spoke w daughter.  We discussed PT recs. Differences between [facility] and SNF. Family level of support, and patient wishes.  [name] will discuss with her mother and father and call me back to let me know what they decide.   Heintzelman-Daniel,[name] Daughter   [phone]  Deshaies,[name] Spouse   [phone]                   Barriers to Discharge: Continued Medical Work up   Patient Goals and CMS Choice        Expected Discharge Plan and Services                                                Prior Living Arrangements/Services                       Activities of Daily Living Home Assistive Devices/Equipment: None ADL Screening (condition at time of admission) Patient's cognitive ability adequate to safely complete daily activities?: No Is the patient deaf or have difficulty hearing?: No Does the patient have difficulty seeing, even when wearing glasses/contacts?: No Does the patient have difficulty concentrating, remembering, or making decisions?: Yes Patient able to express need for assistance with ADLs?: Yes Does the patient have difficulty dressing or bathing?: Yes Independently performs ADLs?: No Communication: Independent Dressing (OT): Needs assistance Is this a change from baseline?: Pre-admission baseline Grooming: Needs assistance Is this a change from baseline?: Pre-admission baseline Feeding: Independent Bathing: Needs assistance Is this a change from baseline?:  Pre-admission baseline Toileting: Needs assistance Is this a change from baseline?: Pre-admission baseline In/Out Bed: Dependent Is this a change from baseline?: Pre-admission baseline Walks in Home: Dependent Is this a change from baseline?: Pre-admission baseline Does the patient have difficulty walking or climbing stairs?: Yes Weakness of Legs: Both Weakness of Arms/Hands: None  Permission Sought/Granted                  Emotional Assessment              Admission diagnosis:  Urinary retention [R33.9] AKI (acute kidney injury) (HCC) [N17.9] Ulcer of right foot, limited to breakdown of skin (HCC) [L97.511] Altered mental status, unspecified altered mental status type [R41.82] Acute metabolic encephalopathy [G93.41] Patient Active Problem List   Diagnosis Date Noted   AKI (acute kidney injury) (HCC) 06/15/2021   Ischemic cardiomyopathy    Sepsis secondary to UTI (HCC) 09/10/2020   Acute metabolic encephalopathy 09/10/2020   Closed fracture of right patella 09/10/2020   PAD (peripheral artery disease) (HCC) 01/20/2020   Fronto-temporal dementia (HCC) 08/04/2019   Left shoulder pain    TBI (traumatic brain injury) (HCC)    E-coli UTI    Urinary retention    Postoperative pain    Agitation    SDH (subdural hematoma) (HCC) 07/08/2019   Subdural hematoma (HCC)  06/30/2019   Erectile dysfunction 07/28/2018   Peripheral vascular disease (HCC) 07/16/2017   Anxiety and depression 07/16/2017   PCP NOTES >>>>>>>>>>>> 07/08/2016   Dermatitis 04/19/2015   Elevated LFTs 02/12/2015   Hyperglycemia 09/01/2014   DJD (degenerative joint disease) 09/01/2014   Annual physical exam 09/01/2014   S/P CABG x 4 11/07/2013   HTN (hypertension) 10/29/2013   Tobacco abuse 10/29/2013   Coronary atherosclerosis of native coronary artery 10/29/2013   PCP:  [name], MD Pharmacy:   [facility]) - [place], [place] - [phone] [address] [place] [address] Phone: [phone] Fax: [phone]  [id number] - [address] [place] - [address] AT [facility] [address] [place] [address] Phone: [phone] Fax: [phone]     Social Determinants of Health (SDOH) Interventions    Readmission Risk Interventions Readmission Risk Prevention Plan 06/14/2021 09/12/2020 03/12/2020  Post Dischage Appt Complete - -  Medication Screening Complete - -  Transportation Screening Complete Complete -  PCP or Specialist Appt within 3-5 Days - Complete -  HRI or Home Care Consult - Complete -  Social Work Consult for Recovery Care Planning/Counseling - Complete -  Palliative Care Screening - Not Applicable -  Medication Review (RN Care Manager) - Complete Complete  Some recent data might be hidden

## 2021-06-20 DIAGNOSIS — F17200 Nicotine dependence, unspecified, uncomplicated: Secondary | ICD-10-CM | POA: Diagnosis not present

## 2021-06-20 DIAGNOSIS — K5900 Constipation, unspecified: Secondary | ICD-10-CM | POA: Diagnosis not present

## 2021-06-20 DIAGNOSIS — G9341 Metabolic encephalopathy: Secondary | ICD-10-CM | POA: Diagnosis not present

## 2021-06-20 DIAGNOSIS — N179 Acute kidney failure, unspecified: Secondary | ICD-10-CM | POA: Diagnosis not present

## 2021-06-20 LAB — CBC WITH DIFFERENTIAL/PLATELET
Abs Immature Granulocytes: 0.68 10*3/uL — ABNORMAL HIGH (ref 0.00–0.07)
Basophils Absolute: 0.1 10*3/uL (ref 0.0–0.1)
Basophils Relative: 0 %
Eosinophils Absolute: 0.1 10*3/uL (ref 0.0–0.5)
Eosinophils Relative: 0 %
HCT: 27.6 % — ABNORMAL LOW (ref 39.0–52.0)
Hemoglobin: 8.7 g/dL — ABNORMAL LOW (ref 13.0–17.0)
Immature Granulocytes: 3 %
Lymphocytes Relative: 10 %
Lymphs Abs: 2.4 10*3/uL (ref 0.7–4.0)
MCH: 22.1 pg — ABNORMAL LOW (ref 26.0–34.0)
MCHC: 31.5 g/dL (ref 30.0–36.0)
MCV: 70.2 fL — ABNORMAL LOW (ref 80.0–100.0)
Monocytes Absolute: 1.3 10*3/uL — ABNORMAL HIGH (ref 0.1–1.0)
Monocytes Relative: 5 %
Neutro Abs: 20.9 10*3/uL — ABNORMAL HIGH (ref 1.7–7.7)
Neutrophils Relative %: 82 %
Platelets: 867 10*3/uL — ABNORMAL HIGH (ref 150–400)
RBC: 3.93 MIL/uL — ABNORMAL LOW (ref 4.22–5.81)
RDW: 20.1 % — ABNORMAL HIGH (ref 11.5–15.5)
WBC: 25.4 10*3/uL — ABNORMAL HIGH (ref 4.0–10.5)
nRBC: 0.1 % (ref 0.0–0.2)

## 2021-06-20 LAB — COMPREHENSIVE METABOLIC PANEL
ALT: 173 U/L — ABNORMAL HIGH (ref 0–44)
AST: 69 U/L — ABNORMAL HIGH (ref 15–41)
Albumin: 2.7 g/dL — ABNORMAL LOW (ref 3.5–5.0)
Alkaline Phosphatase: 125 U/L (ref 38–126)
Anion gap: 8 (ref 5–15)
BUN: 19 mg/dL (ref 8–23)
CO2: 22 mmol/L (ref 22–32)
Calcium: 9 mg/dL (ref 8.9–10.3)
Chloride: 102 mmol/L (ref 98–111)
Creatinine, Ser: 0.88 mg/dL (ref 0.61–1.24)
GFR, Estimated: >60 mL/min (ref >60)
Glucose, Bld: 118 mg/dL — ABNORMAL HIGH (ref 70–99)
Potassium: 4.3 mmol/L (ref 3.5–5.1)
Sodium: 132 mmol/L — ABNORMAL LOW (ref 135–145)
Total Bilirubin: 0.6 mg/dL (ref 0.3–1.2)
Total Protein: 7.2 g/dL (ref 6.5–8.1)

## 2021-06-20 LAB — PHOSPHORUS: Phosphorus: 3.8 mg/dL (ref 2.5–4.6)

## 2021-06-20 LAB — C-REACTIVE PROTEIN: CRP: 14.1 mg/dL — ABNORMAL HIGH (ref <1.0)

## 2021-06-20 LAB — D-DIMER, QUANTITATIVE: D-Dimer, Quant: 2.02 ug/mL-FEU — ABNORMAL HIGH (ref 0.00–0.50)

## 2021-06-20 LAB — MAGNESIUM: Magnesium: 2.1 mg/dL (ref 1.7–2.4)

## 2021-06-20 NOTE — Plan of Care (Signed)

## 2021-06-20 NOTE — Progress Notes (Signed)
Occupational Therapy Treatment Patient Details Name: Lonnie [name] MRN: [id number] DOB: 08-26-1951 Today's Date: 06/20/2021    History of present illness Lonnie [name] is a 70 y.o. male with progressive R foot/calf pain. Duplex revealed occlusion of right femoral to BK popliteal bypass. Acute ischemic skin changes of dorsum of right foot. Pt underwent peripheral vascular thrombectomy of R LE.   OT comments  Pt with gradual progress towards OT goals though limited by confusion and R LE pain. Session focused on mobility attempts during ADLs with pt overall Max A x 2 for safety in mobilizing to sink with RW. Pt noted to limit WB through R LE. Due to poor standing tolerance, pt opted to complete ADLs while seated at sink. Continue to recommend SNF for short term rehab. Plan to progress endurance/balance during ADLs in next session.    Follow Up Recommendations  Supervision/Assistance - 24 hour;SNF    Equipment Recommendations  3 in 1 bedside commode;Wheelchair (measurements OT);Wheelchair cushion (measurements OT)    Recommendations for Other Services      Precautions / Restrictions Precautions Precautions: Fall Precaution Comments: painful R foot Restrictions Weight Bearing Restrictions: No       Mobility Bed Mobility Overal bed mobility: Needs Assistance Bed Mobility: Supine to Sit     Supine to sit: Supervision     General bed mobility comments: supervision for safety, increased time    Transfers Overall transfer level: Needs assistance Equipment used: Rolling walker (2 wheeled) Transfers: Sit to/from Stand Sit to Stand: +2 physical assistance;+2 safety/equipment;Mod assist         General transfer comment: Mod A x 2 for initial sit to stand using RW but progressed to Min A x 2 overall future sit to stands. Poor standing tolerance as pt sat back on bed multiple times requiring cues for continuation of activity    Balance Overall balance assessment: Needs  assistance Sitting-balance support: No upper extremity supported;Feet supported Sitting balance-Leahy Scale: Good     Standing balance support: Bilateral upper extremity supported;Single extremity supported;During functional activity Standing balance-Leahy Scale: Poor Standing balance comment: requires UE support, vc for upright posture                           ADL either performed or assessed with clinical judgement   ADL Overall ADL's : Needs assistance/impaired Eating/Feeding: Set up;Sitting   Grooming: Supervision/safety;Sitting;Wash/dry face;Oral care Grooming Details (indicate cue type and reason): supervision with minor cues for sequencing and problem solving             Lower Body Dressing: Maximal assistance;Bed level Lower Body Dressing Details (indicate cue type and reason): Max A to don socks, appears this is pt's baseline             Functional mobility during ADLs: Maximal assistance;+2 for safety/equipment;Rolling walker;Cueing for sequencing;Cueing for safety General ADL Comments: Pt limited by confusion, pain in R foot with weightbearing during mobility attempts. Decreased standing tolerance increasing fall risk     Vision   Vision Assessment?: No apparent visual deficits   Perception     Praxis      Cognition Arousal/Alertness: Awake/alert;Lethargic Behavior During Therapy: WFL for tasks assessed/performed Overall Cognitive Status: No family/caregiver present to determine baseline cognitive functioning Area of Impairment: Attention;Memory;Problem solving;Awareness;Following commands;Safety/judgement                   Current Attention Level: Selective Memory: Decreased short-term memory Following Commands: Follows  one step commands inconsistently;Follows one step commands with increased time;Follows multi-step commands inconsistently;Follows multi-step commands with increased time Safety/Judgement: Decreased awareness of  deficits;Decreased awareness of safety Awareness: Intellectual Problem Solving: Slow processing;Decreased initiation;Requires verbal cues;Difficulty sequencing;Requires tactile cues General Comments: hx of dementia, able to answer orientation questions. confusion noted throughout - consistent cues for sequencing, safety and attention to task as pt tendency to keep eyes closed. brief bout of agitation when confused on where chair was for seated rest break        Exercises     Shoulder Instructions       General Comments      Pertinent Vitals/ Pain       Pain Assessment: Faces Faces Pain Scale: Hurts little more Pain Location: R foot with weightbearing Pain Descriptors / Indicators: Grimacing;Discomfort;Tender Pain Intervention(s): Limited activity within patient's tolerance;Monitored during session  Home Living                                          Prior Functioning/Environment              Frequency  Min 2X/week        Progress Toward Goals  OT Goals(current goals can now be found in the care plan section)  Progress towards OT goals: Progressing toward goals  Acute Rehab OT Goals Patient Stated Goal: go home OT Goal Formulation: Patient unable to participate in goal setting Time For Goal Achievement: 06/30/21 Potential to Achieve Goals: Good ADL Goals Pt Will Perform Grooming: with modified independence;standing Pt Will Transfer to Toilet: with min assist;squat pivot transfer;bedside commode Pt Will Perform Toileting - Clothing Manipulation and hygiene: with min assist;sitting/lateral leans Additional ADL Goal #1: Sit - stand with min A in preparation for ADL tasks  Plan Discharge plan remains appropriate    Co-evaluation    PT/OT/SLP Co-Evaluation/Treatment: Yes Reason for Co-Treatment: To address functional/ADL transfers;For patient/therapist safety   OT goals addressed during session: ADL's and self-care      AM-PAC OT "6  Clicks" Daily Activity     Outcome Measure   Help from another person eating meals?: None Help from another person taking care of personal grooming?: A Little Help from another person toileting, which includes using toliet, bedpan, or urinal?: A Lot Help from another person bathing (including washing, rinsing, drying)?: A Lot Help from another person to put on and taking off regular upper body clothing?: A Little Help from another person to put on and taking off regular lower body clothing?: A Lot 6 Click Score: 16    End of Session Equipment Utilized During Treatment: Gait belt;Rolling walker  OT Visit Diagnosis: Unsteadiness on feet (R26.81);Other abnormalities of gait and mobility (R26.89);Muscle weakness (generalized) (M62.81);Pain;Other symptoms and signs involving cognitive function Pain - Right/Left: Right Pain - part of body: Leg   Activity Tolerance Patient tolerated treatment well;Patient limited by pain   Patient Left in chair;with call bell/phone within reach;with chair alarm set   Nurse Communication          Time: 0175-1025 OT Time Calculation (min): 40 min  Charges: OT General Charges $OT Visit: 1 Visit OT Treatments $Self Care/Home Management : 8-22 mins $Therapeutic Activity: 8-22 mins  Bradd Canary, OTR/L Acute Rehab Services Office: [phone]    Lorre Munroe 06/20/2021, 3:00 PM

## 2021-06-20 NOTE — TOC Progression Note (Addendum)
Transition of Care [facility]) - Progression Note    Patient Details  Name: Lonnie [name] MRN: [id number] Date of Birth: [birth year]  Transition of Care [facility]) CM/SW Contact  Okey Dupre Lazaro Arms, LCSW Phone Number: 06/20/2021, 4:02 PM  Clinical Narrative:  CSW checked facility responses and 2 SNF's made bed offers: [place] and [facility]. Call made to [facility] as this is the wife's preference and message left.   4:06 pm: Call made to Mrs. Stahnke [phone]) and provided her with the names of the nursing facilities that made bed offers and that [facility] was contacted and a message left. Mr. Shonk informed that she will be contacted once CSW hears from [place].       Barriers to Discharge: Continued Medical Work up  Expected Discharge Plan and Services                                                 Social Determinants of Health (SDOH) Interventions  No SDOH interventions requested or needed at this time.  Readmission Risk Interventions Readmission Risk Prevention Plan 06/14/2021 09/12/2020 03/12/2020  Post Dischage Appt Complete - -  Medication Screening Complete - -  Transportation Screening Complete Complete -  PCP or Specialist Appt within 3-5 Days - Complete -  HRI or Home Care Consult - Complete -  Social Work Consult for Recovery Care Planning/Counseling - Complete -  Palliative Care Screening - Not Applicable -  Medication Review [facility]) - Complete Complete  Some recent data might be hidden

## 2021-06-20 NOTE — Progress Notes (Signed)
Physical Therapy Treatment Patient Details Name: Lonnie [name] MRN: [id number] DOB: 06-23-1951 Today's Date: 06/20/2021    History of Present Illness Lonnie [name] is a 69 y.o. male with progressive R foot/calf pain. Duplex revealed occlusion of right femoral to BK popliteal bypass. Acute ischemic skin changes of dorsum of right foot. Pt underwent peripheral vascular thrombectomy of R LE.    PT Comments    Pt asleep on entry, easily roused and agreeable to therapy however requires constant cuing for keeping eyes open. Pt requires increased assist in many cases because he is not looking at what he is doing. In addition he is limited in safe mobility by R foot pain especially in dependent position and weightbearing, as well as generalized weakness and decreased balance. Pt is supervision for bed mobility, mod-minAx2 for transfers and maxAx2 for ambulation with RW. PT recommending SNF level rehab at discharge. PT will continue to follow acutely.    Follow Up Recommendations  SNF;Supervision/Assistance - 24 hour     Equipment Recommendations  None recommended by PT;Other (comment) (BSC if family does not have one, per chart review they have RW, tub bench, ramp and manual wheelchair)    Recommendations for Other Services       Precautions / Restrictions Precautions Precautions: Fall Precaution Comments: painful R foot Restrictions Weight Bearing Restrictions: No    Mobility  Bed Mobility Overal bed mobility: Needs Assistance Bed Mobility: Supine to Sit     Supine to sit: Supervision     General bed mobility comments: supervision for safety, increased time    Transfers Overall transfer level: Needs assistance Equipment used: Rolling walker (2 wheeled) Transfers: Sit to/from Stand Sit to Stand: +2 physical assistance;+2 safety/equipment;Mod assist         General transfer comment: Mod A x 2 for initial sit to stand using RW but progressed to Min A x 2 overall  future sit to stands. Poor standing tolerance as pt sat back on bed multiple times requiring cues for continuation of activity  Ambulation/Gait Ambulation/Gait assistance: Max assist;+2 safety/equipment Gait Distance (Feet): 8 Feet Assistive device: Rolling walker (2 wheeled) Gait Pattern/deviations: Step-to pattern;Decreased step length - left;Decreased stance time - right;Trunk flexed Gait velocity: slowed Gait velocity interpretation: <1.31 ft/sec, indicative of household ambulator General Gait Details: maxAx2 for safety more than physical assist, pt would not open eyes and needs assist for management of RW, provided close chair follow and pt could not be convinced that there was a chair behind him         Balance Overall balance assessment: Needs assistance Sitting-balance support: No upper extremity supported;Feet supported Sitting balance-Leahy Scale: Good     Standing balance support: Bilateral upper extremity supported;Single extremity supported;During functional activity Standing balance-Leahy Scale: Poor Standing balance comment: requires UE support, vc for upright posture                            Cognition Arousal/Alertness: Awake/alert;Lethargic Behavior During Therapy: WFL for tasks assessed/performed Overall Cognitive Status: No family/caregiver present to determine baseline cognitive functioning Area of Impairment: Attention;Memory;Problem solving;Awareness;Following commands;Safety/judgement                   Current Attention Level: Selective Memory: Decreased short-term memory Following Commands: Follows one step commands inconsistently;Follows one step commands with increased time;Follows multi-step commands inconsistently;Follows multi-step commands with increased time Safety/Judgement: Decreased awareness of deficits;Decreased awareness of safety Awareness: Intellectual Problem Solving: Slow processing;Decreased initiation;Requires  verbal  cues;Difficulty sequencing;Requires tactile cues General Comments: hx of dementia, able to answer orientation questions. confusion noted throughout - consistent cues for sequencing, safety and attention to task as pt tendency to keep eyes closed. brief bout of agitation when confused on where chair was for seated rest break         General Comments General comments (skin integrity, edema, etc.): VSS on RA      Pertinent Vitals/Pain Pain Assessment: Faces Faces Pain Scale: Hurts little more Pain Location: R foot with weightbearing Pain Descriptors / Indicators: Grimacing;Discomfort;Tender Pain Intervention(s): Limited activity within patient's tolerance;Monitored during session;Repositioned     PT Goals (current goals can now be found in the care plan section) Acute Rehab PT Goals Patient Stated Goal: go home PT Goal Formulation: With patient Time For Goal Achievement: 07/01/21 Potential to Achieve Goals: Fair Progress towards PT goals: Progressing toward goals    Frequency    Min 3X/week      PT Plan Current plan remains appropriate    Co-evaluation   Reason for Co-Treatment: To address functional/ADL transfers;Necessary to address cognition/behavior during functional activity PT goals addressed during session: Mobility/safety with mobility;Balance OT goals addressed during session: ADL's and self-care      AM-PAC PT "6 Clicks" Mobility   Outcome Measure  Help needed turning from your back to your side while in a flat bed without using bedrails?: None Help needed moving from lying on your back to sitting on the side of a flat bed without using bedrails?: None Help needed moving to and from a bed to a chair (including a wheelchair)?: Total Help needed standing up from a chair using your arms (e.g., wheelchair or bedside chair)?: Total Help needed to walk in hospital room?: Total Help needed climbing 3-5 steps with a railing? : Total 6 Click Score: 12    End of  Session Equipment Utilized During Treatment: Gait belt Activity Tolerance: Patient limited by pain;Patient tolerated treatment well Patient left: in chair;with chair alarm set;with call bell/phone within reach;Other (comment) (heels floated) Nurse Communication: Mobility status PT Visit Diagnosis: Other abnormalities of gait and mobility (R26.89);Pain;Difficulty in walking, not elsewhere classified (R26.2) Pain - Right/Left: Right Pain - part of body: Leg     Time: 1583-0940 PT Time Calculation (min) (ACUTE ONLY): 44 min  Charges:  $Gait Training: 8-22 mins                     Aneisha Skyles B. Beverely Risen PT, DPT Acute Rehabilitation Services Pager [phone] Office [phone]    Elon Alas [facility] 06/20/2021, 3:54 PM

## 2021-06-20 NOTE — Progress Notes (Signed)
PROGRESS NOTE  Lonnie [name] WNU:[id number] DOB: 1951-11-19   PCP: [name], MD  Patient is from: Home  DOA: 06/15/2021 LOS: 4  Chief complaints:  Confusion, generalized weakness and urinary retention  Brief Narrative / Interim history: 70 year old M with PMH of dementia, PAD s/p mechanical thrombectomy and stenting of occluded femoral popliteal bypass on 6/18, ongoing tobacco use, HTN, HLD, BPH, GERD, anxiety and depression returning with confusion, generalized weakness and urinary retention.  Patient was discharged to SNF and went home from SNF on 06/14/2021.    In ED, RLQ drain in situ with bladder scan showing 900 cc, AKI and possible UTI and right foot infection.  Hemodynamically stable.  Mild temp to 99.1.  Initially saturating at 99% on RA. Cr 1.49 (baseline 0.91).  COVID-19 PCR returned positive.  Patient was started on ceftriaxone for UTI and Zyvox for possible right foot infection, and Decadron and remdesivir for COVID-19 infection with desaturation to 93% on RA.  Vascular surgery and ID consulted.  No concern for foot infection at this time.  ID recommended IV cefepime for UTI given prior history of Pseudomonas.  Completed 3 days of remdesivir and 4 days of Decadron.  Encephalopathy and urinary tension resolved.  Therapy recommended SNF.  Medically stable for discharge to SNF.  He will be discharged with indwelling Foley for urinary retention and bladder over distention.  Subjective: Seen and examined earlier this morning.  No major events overnight of this morning.  No complaints today.  Right knee/leg pain resolved.  Denies chest pain, dyspnea or GI symptoms.  Objective: Vitals:   06/19/21 2158 06/20/21 0544 06/20/21 1025 06/20/21 1038  BP: (!) 149/76 123/79 118/68   Pulse: 69 73 81 84  Resp: 18 18 16 16   Temp: 98.3 F (36.8 C) 99.5 F (37.5 C) 98.2 F (36.8 C)   TempSrc: Oral Oral    SpO2: 99% 97% (!) 75% 100%  Weight:      Height:        Intake/Output  Summary (Last 24 hours) at 06/20/2021 1623 Last data filed at 06/20/2021 1300 Gross per 24 hour  Intake 640 ml  Output 2000 ml  Net -1360 ml   Filed Weights   06/16/21 0300  Weight: 84.9 kg    Examination:  GENERAL: No apparent distress.  Nontoxic. HEENT: MMM.  Vision and hearing grossly intact.  NECK: Supple.  No apparent JVD.  RESP: On RA no IWOB.  Fair aeration bilaterally. CVS:  RRR. Heart sounds normal.  ABD/GI/GU: BS+. Abd soft, NTND.  Indwelling Foley catheter MSK/EXT:  Moves extremities.  RLE edema.  Effusion/swelling medial to the right knee.  SKIN: Skin exfoliation/wound over the dorsal aspect of right foot NEURO: Awake and alert. Oriented appropriately.  No apparent focal neuro deficit. PSYCH: Calm. Normal affect.   Procedures:  None  Microbiology summarized: COVID-19 PCR positive. MRSA PCR screen positive. Blood cultures NGTD. Urine culture with multiple species.  Assessment & Plan: AKI/azotemia: Likely mix of prerenal and postobstructive. AKI resolved. Acute urinary retention with bladder over distention: Initially passed voiding trial but had 1.2 L UOP with cath on 6/29. Recent Labs    05/24/21 1549 06/07/21 1025 06/08/21 0317 06/10/21 0249 06/15/21 1809 06/16/21 0553 06/17/21 0015 06/18/21 1204 06/19/21 1357 06/20/21 0336  BUN 17 19 18 18  32* 23 16 18 20 19   CREATININE 0.94 1.00 0.98 0.91 1.49* 0.97 0.87 0.74 0.86 0.88  -Continue monitoring -Continue indwelling Foley for bladder rest -Outpatient follow-up with urology in  about 1 week after discharge for voiding trial -Continue Flomax and Urecholine  Right foot ulcer/wound-low suspicion for foot infection by all experts PAD s/p recent mechanical thrombectomy and stent of occluded femoropopliteal bypass Right lower extremity pain-likely hematoma from vascular procedure.  Pain resolved. -Wound seems to be about the same but significant edema in right leg. -Zyvox>> vancomycin>> off per ID.  Low  suspicion for foot infection. -Appreciate recommendation by infectious disease-discontinued antibiotics. -Continue as needed Tylenol and tramadol.  Discontinue oxycodone -Appreciate help by [facility]OCN  Acute metabolic encephalopathy-could be from UTI, COVID-19 infection, polypharmacy, delirium in the setting of dementia.  Resolved.  Oriented x4. -Treat treatable causes -Reorientation and delirium precautions -May have to cut down on his Seroquel, gabapentin and Klonopin if worse.   Acute COVID-19 pneumonia: symptomatic for 1 day. Tested positive on 6/25.  Desaturated to 93% on RA.  CXR with increased patchy opacities RIGHT lung base. Cardiomegaly and mild diffuse pulmonary interstitial congestion. CRP 29. Now saturating at 98% on RA.  Recent Labs    06/18/21 1204 06/19/21 1357 06/20/21 0336  DDIMER 2.36* 2.43* 2.02*  FERRITIN 75 62  --   CRP 10.1* 7.6* 14.1*  -Completed 3 days of remdesivir and 4 days of Decadron -Subcu Lovenox for VTE prophylaxis. -Protonix for GI prophylaxis -Monitor inflammatory markers.   Elevated D-dimer: Likely due to COVID infection.  Low suspicion for PE.  LE [place] negative for DVT.  Presumptive UTI: UA with moderate pyuria and bacteriuria.  Urine culture with multiple species.  -Escalated from IV CTX to IV cefepime by ID given history of Pseudomonas.  Last dose on 6/29  Elevated liver enzymes: could be due to COVID-19 infection, remdesivir and a/or statin.  CK within normal.  RUQ [place] suggestive for hepatic steatosis otherwise negative.  HIV and acute hepatitis panel negative.  Improving. -Off remdesivir.  -Discontinued statin -Continue trending   ICM/chronic combined CHF: TTE in 2018 with LVEF of 40 to 45%, G1-DD.  Appears to have significant edema in RLE likely from recent revascularization.  However, CXR with some interstitial venous congestion.  BNP 365 but no prior to compare to.  Net -9 L so far. Renal function stable. -Continue home Lasix. -Monitor fluid and  respiratory status  History of CAD s/p CABG: Stable. -Continue Lopressor, aspirin, Plavix   Chronic anxiety/depression: Stable -Continue home meds   Essential hypertension: Normotensive. -Cardiac meds as above   Hyperlipidemia -Holding Lipitor due to elevated liver enzymes   Iron deficiency anemia/ anemia of chronic disease: Hgb dropped about 2 g since admission.  Seems to have dark stool but no hematochezia Recent Labs    06/09/21 1055 06/09/21 1828 06/10/21 0249 06/15/21 1809 06/16/21 0553 06/17/21 0015 06/17/21 1133 06/18/21 1204 06/19/21 1357 06/20/21 0336  HGB 5.8* 8.7* 8.6* 8.9* 8.4* 7.1* 8.0* 8.5* 9.3* 8.7*  -H&H stable. -Goal Hgb >8.0 given history of CAD  GERD -Continue PPI   Generalized weakness: Ambulates using walker at baseline. -PT/OT-recommended SNF   Tobacco use disorder: smokes 2 to 3 cigarettes/day -Counseled family. -Nicotine patch  Leukocytosis/bandemia-likely demargination from steroid.  Improving. -Continue monitoring  Constipation: seems to have resolved. -Senokot-S twice daily  -MiraLAX twice daily as needed  Goal of care counseling: Discussed with patient's daughter, [name].  [name] says patient has DNR and DNI. I have changed CODE STATUS to DNR/DNI and notify patient's RN.  Body mass index is 24.03 kg/m.         DVT prophylaxis:  enoxaparin (LOVENOX) injection 40 mg Start:  06/15/21 2200 SCDs Start: 06/15/21 2150  Code Status: DNR/DNI Family Communication: Patient and/or RN.  Updated patient's daughter over the phone. Level of care: Telemetry Medical Status is: Inpatient  Remains inpatient appropriate because:Unsafe d/c plan  Dispo:  Patient From: Home  Planned Disposition: Skilled Nursing Facility  Medically stable for discharge: No       Consultants:  Vascular surgery Infectious disease   Sch Meds:  Scheduled Meds:  aspirin EC  81 mg Oral Daily   bethanechol  10 mg Oral TID   Chlorhexidine Gluconate  Cloth  6 each Topical Daily   Chlorhexidine Gluconate Cloth  6 each Topical Q0600   citalopram  20 mg Oral Daily   clopidogrel  75 mg Oral Daily   enoxaparin (LOVENOX) injection  40 mg Subcutaneous Q24H   ferrous fumarate-b12-vitamic C-folic acid  1 capsule Oral Daily   furosemide  20 mg Oral Daily   gabapentin  100 mg Oral BID   irbesartan  75 mg Oral Daily   melatonin  10 mg Oral QHS   metoprolol tartrate  12.5 mg Oral BID   multivitamin with minerals  1 tablet Oral Daily   mupirocin ointment  1 application Nasal BID   nicotine  7 mg Transdermal Daily   pantoprazole  40 mg Oral QPM   QUEtiapine  200 mg Oral BID   senna-docusate  1 tablet Oral BID   sodium chloride flush  3 mL Intravenous Once   tamsulosin  0.8 mg Oral QPM   Continuous Infusions:   PRN Meds:.acetaminophen, albuterol, clonazePAM, guaiFENesin-dextromethorphan, oxyCODONE, polyethylene glycol, prochlorperazine, traMADol  Antimicrobials: Anti-infectives (From admission, onward)    Start     Dose/Rate Route Frequency Ordered Stop   06/17/21 1400  ceFEPIme (MAXIPIME) 2 g in sodium chloride 0.9 % 100 mL IVPB        2 g 200 mL/hr over 30 Minutes Intravenous Every 12 hours 06/17/21 1258 06/19/21 0955   06/17/21 1000  remdesivir 100 mg in sodium chloride 0.9 % 100 mL IVPB       See Hyperspace for full Linked Orders Report.   100 mg 200 mL/hr over 30 Minutes Intravenous Daily 06/16/21 0135 06/18/21 1026   06/16/21 2200  cefTRIAXone (ROCEPHIN) 2 g in sodium chloride 0.9 % 100 mL IVPB  Status:  Discontinued        2 g 200 mL/hr over 30 Minutes Intravenous Every 24 hours 06/15/21 2304 06/17/21 1218   06/16/21 2200  vancomycin (VANCOREADY) IVPB 1000 mg/200 mL  Status:  Discontinued        1,000 mg 200 mL/hr over 60 Minutes Intravenous Every 12 hours 06/16/21 0820 06/17/21 1218   06/16/21 0915  vancomycin (VANCOREADY) IVPB 1750 mg/350 mL        1,750 mg 175 mL/hr over 120 Minutes Intravenous  Once 06/16/21 0820 06/16/21  1114   06/16/21 0230  remdesivir 200 mg in sodium chloride 0.9% 250 mL IVPB       See Hyperspace for full Linked Orders Report.   200 mg 580 mL/hr over 30 Minutes Intravenous Once 06/16/21 0135 06/16/21 0324   06/15/21 2230  linezolid (ZYVOX) IVPB 600 mg  Status:  Discontinued        600 mg 300 mL/hr over 60 Minutes Intravenous Every 12 hours 06/15/21 2228 06/16/21 0811   06/15/21 2115  cefTRIAXone (ROCEPHIN) 2 g in sodium chloride 0.9 % 100 mL IVPB        2 g 200 mL/hr over 30 Minutes Intravenous  Once 06/15/21 2114 06/15/21 2328        I have personally reviewed the following labs and images: CBC: Recent Labs  Lab 06/16/21 0553 06/17/21 0015 06/17/21 1133 06/18/21 1204 06/19/21 1357 06/20/21 0336  WBC 17.0* 19.7* 20.9* 20.3* 35.7* 25.4*  NEUTROABS 13.8* 16.1*  --  17.2* 32.4* 20.9*  HGB 8.4* 7.1* 8.0* 8.5* 9.3* 8.7*  HCT 27.2* 23.1* 25.8* 27.4* 30.3* 27.6*  MCV 71.8* 71.1* 71.7* 70.6* 71.6* 70.2*  PLT 486* 510* 572* 724* 870* 867*   BMP &GFR Recent Labs  Lab 06/16/21 0553 06/17/21 0015 06/18/21 1204 06/19/21 1357 06/20/21 0336  NA 136 135 134* 131* 132*  K 3.8 4.0 4.3 4.5 4.3  CL 105 105 103 98 102  CO2 22 20* 21* 21* 22  GLUCOSE 111* 119* 125* 147* 118*  BUN CREATININE 0.97 0.87 0.74 0.86 0.88  CALCIUM 8.5* 8.1* 8.8* 9.2 9.0  MG 2.2 2.1 2.0 2.0 2.1  PHOS 3.3 2.3* 2.8 3.5 3.8   Estimated Creatinine Clearance: 93.4 mL/min (by C-G formula based on SCr of 0.88 mg/dL). Liver & Pancreas: Recent Labs  Lab 06/16/21 0553 06/17/21 0015 06/18/21 1204 06/19/21 1357 06/20/21 0336  AST 138* 152* 215* 114* 69*  ALT 135* 146* 264* 223* 173*  ALKPHOS 154* 126 122 135* 125  BILITOT 0.5 0.4 0.4 0.6 0.6  PROT 7.0 6.3* 7.2 7.8 7.2  ALBUMIN 2.4* 2.2* 2.6* 3.0* 2.7*   No results for input(s): LIPASE, AMYLASE in the last 168 hours. No results for input(s): AMMONIA in the last 168 hours. Diabetic: No results for input(s): HGBA1C in the last 72  hours. No results for input(s): GLUCAP in the last 168 hours. Cardiac Enzymes: Recent Labs  Lab 06/18/21 1204  CKTOTAL 38*   No results for input(s): PROBNP in the last 8760 hours. Coagulation Profile: No results for input(s): INR, PROTIME in the last 168 hours. Thyroid Function Tests: No results for input(s): TSH, T4TOTAL, FREET4, T3FREE, THYROIDAB in the last 72 hours. Lipid Profile: No results for input(s): CHOL, HDL, LDLCALC, TRIG, CHOLHDL, LDLDIRECT in the last 72 hours. Anemia Panel: Recent Labs    06/18/21 1204 06/19/21 1357  FERRITIN 75 62   Urine analysis:    Component Value Date/Time   COLORURINE YELLOW 06/15/2021 2218   APPEARANCEUR TURBID (A) 06/15/2021 2218   LABSPEC 1.014 06/15/2021 2218   PHURINE 5.0 06/15/2021 2218   GLUCOSEU NEGATIVE 06/15/2021 2218   GLUCOSEU NEGATIVE 04/12/2021 1323   HGBUR SMALL (A) 06/15/2021 2218   BILIRUBINUR NEGATIVE 06/15/2021 2218   KETONESUR 5 (A) 06/15/2021 2218   PROTEINUR 100 (A) 06/15/2021 2218   UROBILINOGEN 0.2 04/12/2021 1323   NITRITE NEGATIVE 06/15/2021 2218   LEUKOCYTESUR MODERATE (A) 06/15/2021 2218   Sepsis Labs: Invalid input(s): PROCALCITONIN, LACTICIDVEN  Microbiology: Recent Results (from the past 240 hour(s))  MRSA Next Gen by PCR, Nasal     Status: Abnormal   Collection Time: 06/15/21 10:21 PM   Specimen: Nasal Mucosa; Nasal Swab  Result Value Ref Range Status   MRSA by PCR Next Gen DETECTED (A) NOT DETECTED Final    Comment: RESULT CALLED TO, READ BACK BY AND VERIFIED WITH: E,BLUE RN  06/16/21 EB (NOTE) The GeneXpert MRSA Assay (FDA approved for NASAL specimens only), is one component of a comprehensive MRSA colonization surveillance program. It is not intended to diagnose MRSA infection nor to guide or monitor treatment for MRSA infections. Test performance is not FDA approved in patients  less than 28 years old. Performed at [facility], [address] [place], [place] [address]    Resp Panel by RT-PCR (Flu A&B, Covid) Nasopharyngeal Swab     Status: Abnormal   Collection Time: 06/15/21 10:36 PM   Specimen: Nasopharyngeal Swab; Nasopharyngeal(NP) swabs in vial transport medium  Result Value Ref Range Status   SARS Coronavirus 2 by RT PCR POSITIVE (A) NEGATIVE Final    Comment: RESULT CALLED TO, READ BACK BY AND VERIFIED WITH: RN EMMANUEL CASTRO BY MESSA H. AT 0110 ON 6 26 2022 (NOTE) SARS-CoV-2 target nucleic acids are DETECTED.  The SARS-CoV-2 RNA is generally detectable in upper respiratory specimens during the acute phase of infection. Positive results are indicative of the presence of the identified virus, but do not rule out bacterial infection or co-infection with other pathogens not detected by the test. Clinical correlation with patient history and other diagnostic information is necessary to determine patient infection status. The expected result is Negative.  Fact Sheet for Patients: BloggerCourse.com  Fact Sheet for Healthcare Providers: SeriousBroker.it  This test is not yet approved or cleared by the [place] FDA and  has been authorized for detection and/or diagnosis of SARS-CoV-2 by FDA under an Emergency Use Authorization (EUA).  This EUA will remain in effect (meanin g this test can be used) for the duration of  the COVID-19 declaration under Section 564(b)(1) of the Act, 21 U.S.C. section 360bbb-3(b)(1), unless the authorization is terminated or revoked sooner.     Influenza A by PCR NEGATIVE NEGATIVE Final   Influenza B by PCR NEGATIVE NEGATIVE Final    Comment: (NOTE) The Xpert Xpress SARS-CoV-2/FLU/RSV plus assay is intended as an aid in the diagnosis of influenza from Nasopharyngeal swab specimens and should not be used as a sole basis for treatment. Nasal washings and aspirates are unacceptable for Xpert Xpress SARS-CoV-2/FLU/RSV testing.  Fact Sheet for  Patients: BloggerCourse.com  Fact Sheet for Healthcare Providers: SeriousBroker.it  This test is not yet approved or cleared by the [place] FDA and has been authorized for detection and/or diagnosis of SARS-CoV-2 by FDA under an Emergency Use Authorization (EUA). This EUA will remain in effect (meaning this test can be used) for the duration of the COVID-19 declaration under Section 564(b)(1) of the Act, 21 U.S.C. section 360bbb-3(b)(1), unless the authorization is terminated or revoked.  Performed at [facility], [address], [place], [place] [address]   Culture, blood (routine x 2)     Status: None (Preliminary result)   Collection Time: 06/16/21  2:05 AM   Specimen: BLOOD LEFT HAND  Result Value Ref Range Status   Specimen Description BLOOD LEFT HAND  Final   Special Requests   Final    BOTTLES DRAWN AEROBIC ONLY Blood Culture adequate volume   Culture   Final    NO GROWTH 4 DAYS Performed at [facility], [address] [place], [place] [address]    Report Status PENDING  Incomplete  Culture, blood (routine x 2)     Status: None (Preliminary result)   Collection Time: 06/16/21  2:26 AM   Specimen: BLOOD RIGHT HAND  Result Value Ref Range Status   Specimen Description BLOOD RIGHT HAND  Final   Special Requests   Final    BOTTLES DRAWN AEROBIC ONLY Blood Culture adequate volume   Culture   Final    NO GROWTH 4 DAYS Performed at [facility], [address], [place],  [place] [address]    Report Status PENDING  Incomplete  Culture, Urine     Status: Abnormal   Collection Time: 06/17/21  3:11 PM   Specimen: Urine, Random  Result Value Ref Range Status   Specimen Description URINE, RANDOM  Final   Special Requests   Final    NONE Performed at [facility], [address], [place], [place] [address]    Culture MULTIPLE SPECIES PRESENT, SUGGEST RECOLLECTION (A)  Final   Report Status  06/18/2021 FINAL  Final    Radiology Studies: No results found.    Handsome Anglin T. Khoi Hamberger [facility]  If 7PM-7AM, please contact night-coverage [email or web address] 06/20/2021, 4:23 PM

## 2021-06-20 NOTE — Plan of Care (Signed)
  Problem: Activity: Goal: Risk for activity intolerance will decrease Outcome: Progressing   Problem: Nutrition: Goal: Adequate nutrition will be maintained Outcome: Progressing   Problem: Pain Managment: Goal: General experience of comfort will improve Outcome: Progressing   

## 2021-06-21 ENCOUNTER — Inpatient Hospital Stay (HOSPITAL_COMMUNITY): Payer: Medicare Other

## 2021-06-21 DIAGNOSIS — K5900 Constipation, unspecified: Secondary | ICD-10-CM | POA: Diagnosis not present

## 2021-06-21 DIAGNOSIS — N179 Acute kidney failure, unspecified: Secondary | ICD-10-CM | POA: Diagnosis not present

## 2021-06-21 DIAGNOSIS — F17200 Nicotine dependence, unspecified, uncomplicated: Secondary | ICD-10-CM | POA: Diagnosis not present

## 2021-06-21 DIAGNOSIS — G9341 Metabolic encephalopathy: Secondary | ICD-10-CM | POA: Diagnosis not present

## 2021-06-21 LAB — CULTURE, BLOOD (ROUTINE X 2)
Culture: NO GROWTH
Culture: NO GROWTH
Special Requests: ADEQUATE
Special Requests: ADEQUATE

## 2021-06-21 LAB — COMPREHENSIVE METABOLIC PANEL
ALT: 131 U/L — ABNORMAL HIGH (ref 0–44)
AST: 43 U/L — ABNORMAL HIGH (ref 15–41)
Albumin: 2.7 g/dL — ABNORMAL LOW (ref 3.5–5.0)
Alkaline Phosphatase: 117 U/L (ref 38–126)
Anion gap: 10 (ref 5–15)
BUN: 22 mg/dL (ref 8–23)
CO2: 22 mmol/L (ref 22–32)
Calcium: 9.1 mg/dL (ref 8.9–10.3)
Chloride: 101 mmol/L (ref 98–111)
Creatinine, Ser: 1 mg/dL (ref 0.61–1.24)
GFR, Estimated: >60 mL/min (ref >60)
Glucose, Bld: 141 mg/dL — ABNORMAL HIGH (ref 70–99)
Potassium: 4.3 mmol/L (ref 3.5–5.1)
Sodium: 133 mmol/L — ABNORMAL LOW (ref 135–145)
Total Bilirubin: 0.9 mg/dL (ref 0.3–1.2)
Total Protein: 7.4 g/dL (ref 6.5–8.1)

## 2021-06-21 LAB — CBC WITH DIFFERENTIAL/PLATELET
Abs Immature Granulocytes: 0.6 10*3/uL — ABNORMAL HIGH (ref 0.00–0.07)
Basophils Absolute: 0.1 10*3/uL (ref 0.0–0.1)
Basophils Relative: 0 %
Eosinophils Absolute: 0.1 10*3/uL (ref 0.0–0.5)
Eosinophils Relative: 0 %
HCT: 27.5 % — ABNORMAL LOW (ref 39.0–52.0)
Hemoglobin: 8.3 g/dL — ABNORMAL LOW (ref 13.0–17.0)
Immature Granulocytes: 3 %
Lymphocytes Relative: 8 %
Lymphs Abs: 1.7 10*3/uL (ref 0.7–4.0)
MCH: 21.7 pg — ABNORMAL LOW (ref 26.0–34.0)
MCHC: 30.2 g/dL (ref 30.0–36.0)
MCV: 71.8 fL — ABNORMAL LOW (ref 80.0–100.0)
Monocytes Absolute: 1.2 10*3/uL — ABNORMAL HIGH (ref 0.1–1.0)
Monocytes Relative: 6 %
Neutro Abs: 16.6 10*3/uL — ABNORMAL HIGH (ref 1.7–7.7)
Neutrophils Relative %: 83 %
Platelets: 919 10*3/uL (ref 150–400)
RBC: 3.83 MIL/uL — ABNORMAL LOW (ref 4.22–5.81)
RDW: 20.4 % — ABNORMAL HIGH (ref 11.5–15.5)
WBC: 20.1 10*3/uL — ABNORMAL HIGH (ref 4.0–10.5)
nRBC: 0 % (ref 0.0–0.2)

## 2021-06-21 LAB — PHOSPHORUS: Phosphorus: 3.2 mg/dL (ref 2.5–4.6)

## 2021-06-21 LAB — D-DIMER, QUANTITATIVE: D-Dimer, Quant: 1.57 ug/mL-FEU — ABNORMAL HIGH (ref 0.00–0.50)

## 2021-06-21 LAB — C-REACTIVE PROTEIN: CRP: 23.9 mg/dL — ABNORMAL HIGH (ref <1.0)

## 2021-06-21 LAB — MAGNESIUM: Magnesium: 2.2 mg/dL (ref 1.7–2.4)

## 2021-06-21 IMAGING — CT CT KNEE*R* W/CM
3 of 4 series · 12 of 20 positions shown, 14 images · IV contrast (omnipaque)
Comparison: Lower extremity CT angiogram from [DATE]

CLINICAL DATA: Soft tissue infection.  Active COVID and MRSA.

EXAM:
CT OF THE [DATE] radiograph KNEE WITH CONTRAST
TECHNIQUE: Multidetector CT imaging was performed following the standard
protocol during bolus administration of intravenous contrast.
CONTRAST:  100mL OMNIPAQUE IOHEXOL 300 MG/ML  SOLN

[Series 5: lower ext 1.5 st · axial · 0.52mm/px · z∈[-276,-48]mm · 6 of 214 slices shown, 8 images (1 of 2)]
[im 31/214  soft-tissue]
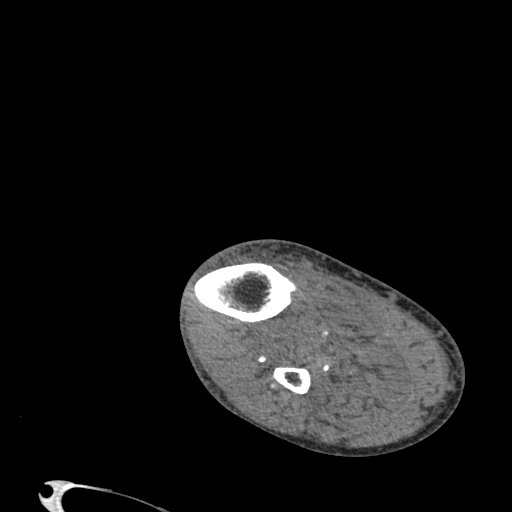
[im 31/214  bone]
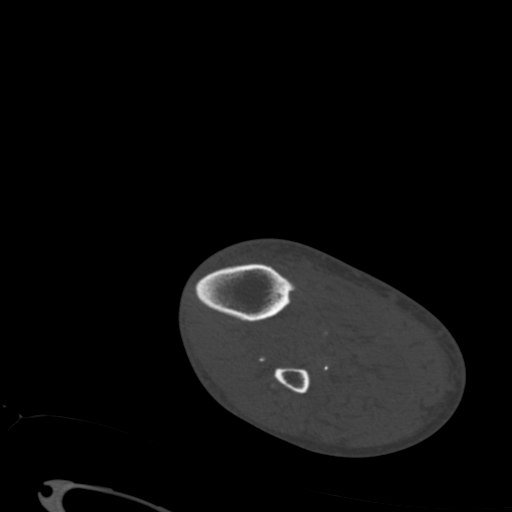
[im 61/214  bone]
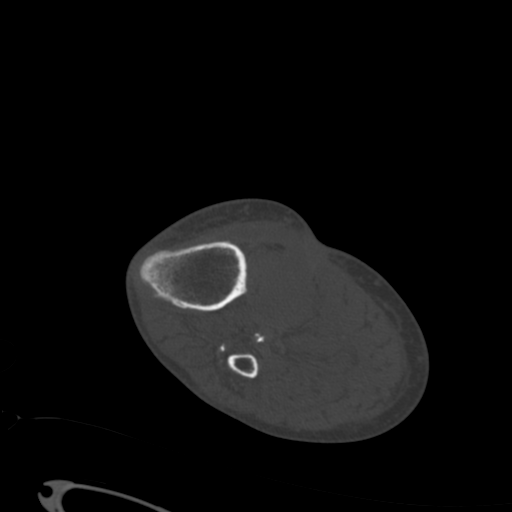
[im 92/214  bone]
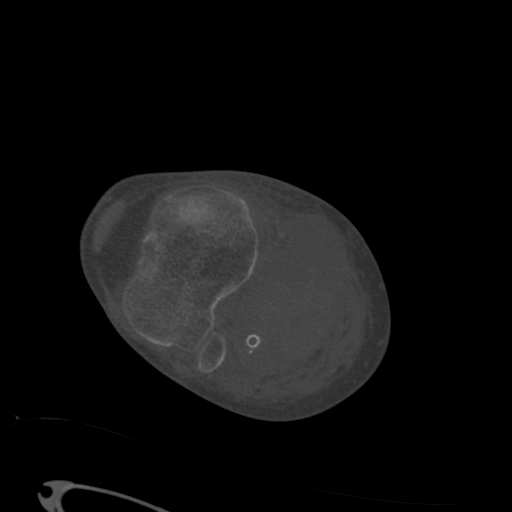
[im 122/214  bone]
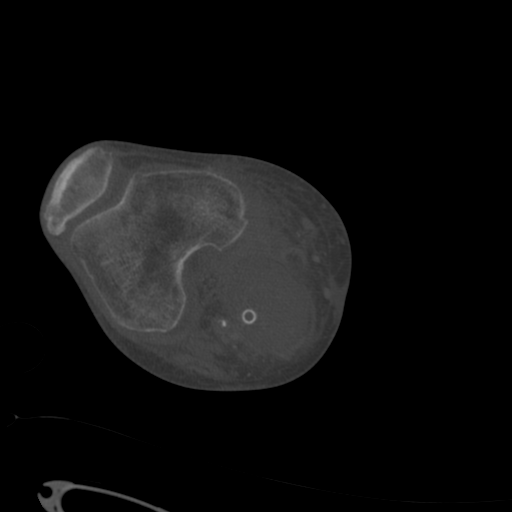
[im 153/214  soft-tissue]
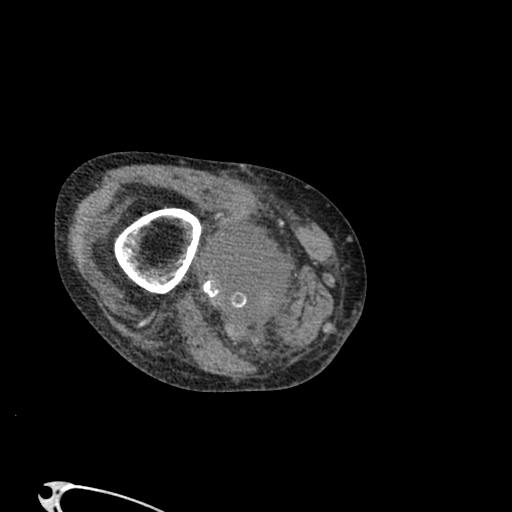
[im 153/214  bone]
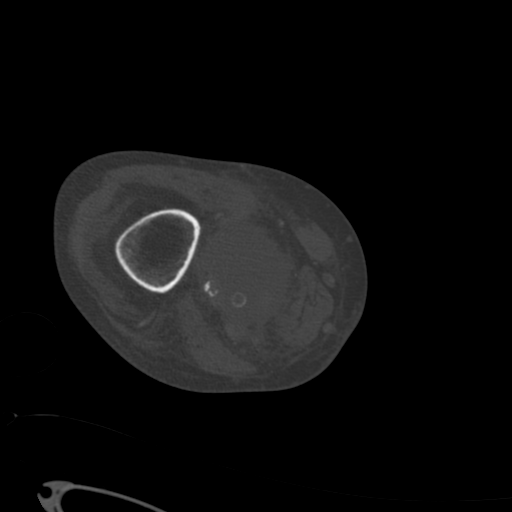
[im 183/214  bone]
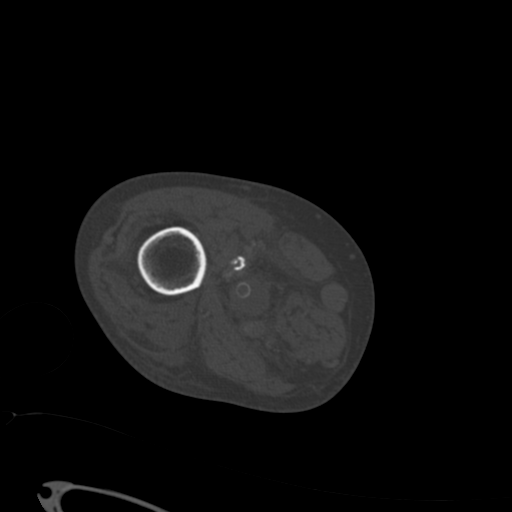

[Series 9: lower ext cor st · coronal · 0.52mm/px · 3 of 166 slices shown]
[im 34/166  bone]
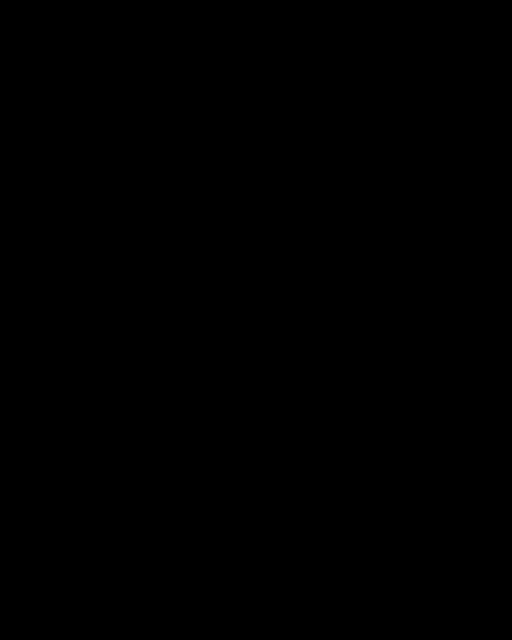
[im 67/166  bone]
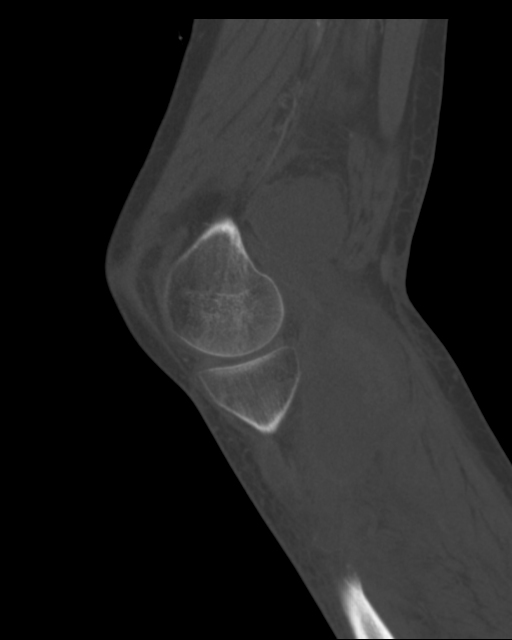
[im 100/166  bone]
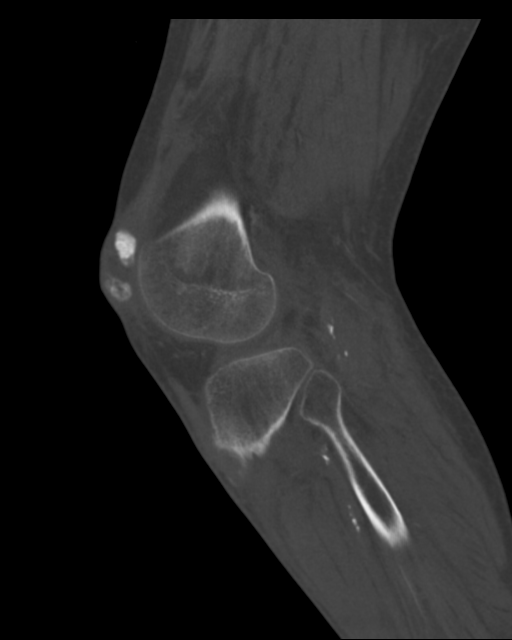

[Series 13: lower ext 1.5 st · axial · 0.52mm/px · z∈[-204,-99]mm · 3 of 142 slices shown (2 of 2)]
[im 36/142  bone]
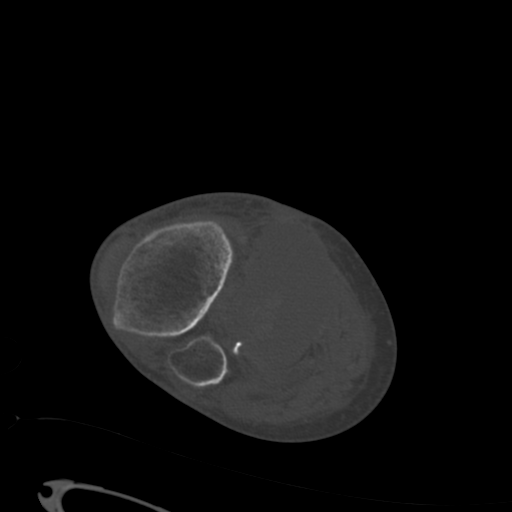
[im 71/142  bone]
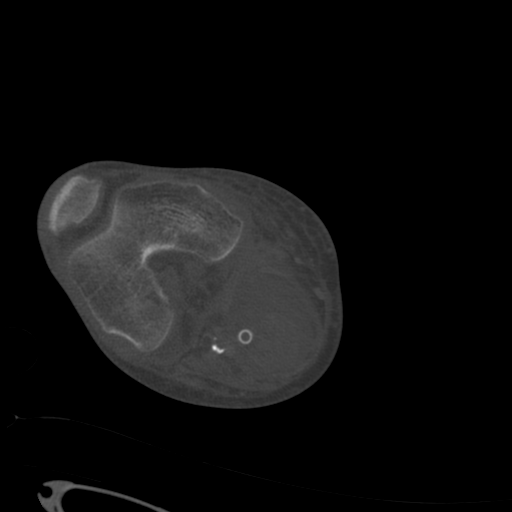
[im 106/142  bone]
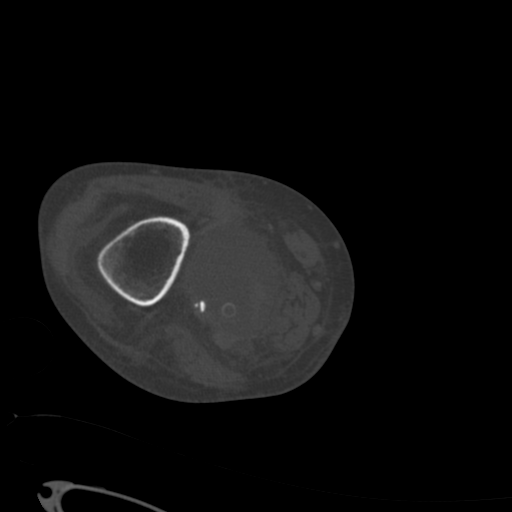

[12 of 20 positions shown; findings below may reference images not displayed]

FINDINGS: Bones/Joint/Cartilage

Marginal spurring along the patella along with a sclerotic bipartite
portion. Small knee joint effusion without substantial appreciable
synovitis. No acute bony findings.

Ligaments

Suboptimally assessed by CT.

Muscles and Tendons

Unremarkable

Soft tissues

The patient as a femoral to popliteal bypass in addition to an
approximately 10 cm stent in the distal portion of the bypass
extending to the anastomosis. Surrounding the distal component of
the bypass graft and the stent, there is a 20.5 by 8.6 by 4.8 cm
(volume = 440 cm^3) complex fluid collection. Proximally this
fluid collection is lower in density, but in the vicinity of the
stent there is surrounding high density components characteristic of
hematoma/blood products. This collection has a caudad extension
along the anteromedial tibia, and bulges outward stores the medial
cutaneous surface in a manner somewhat similar in appearance and
location to the prior abscess shown on [DATE], and accordingly
superinfection of this hematoma is not readily excluded. There is no
observed gas within the collection at this time. Patency of the
graft and stent are not assessed on today's noncontrast CT.
Atherosclerotic calcification of the native popliteal artery is
observed.
IMPRESSION: 1. Approximately 440 cubic cm complex fluid collection surrounds the
distal portion of the femoral-popliteal bypass graft and the stent
along the distal anastomosis in the popliteal space. This has high
density components especially around the stent compatible with blood
products. This also seems to occupy some the space occupied by the
previously drained abscess that was shown on the [DATE] exam;
superinfection of this hematoma is not excluded.
2. Small knee joint effusion without appreciable synovitis.

## 2021-06-21 IMAGING — DX DG KNEE 1-2V*R*
1 series · 3 of 3 positions shown · non-contrast
Comparison: None.

CLINICAL DATA: Right knee swelling.

EXAM:
RIGHT KNEE - 1-2 VIEW

[Series 1: knee · 0.14mm/px · 3 of 3 slices shown]
[im 1/3]
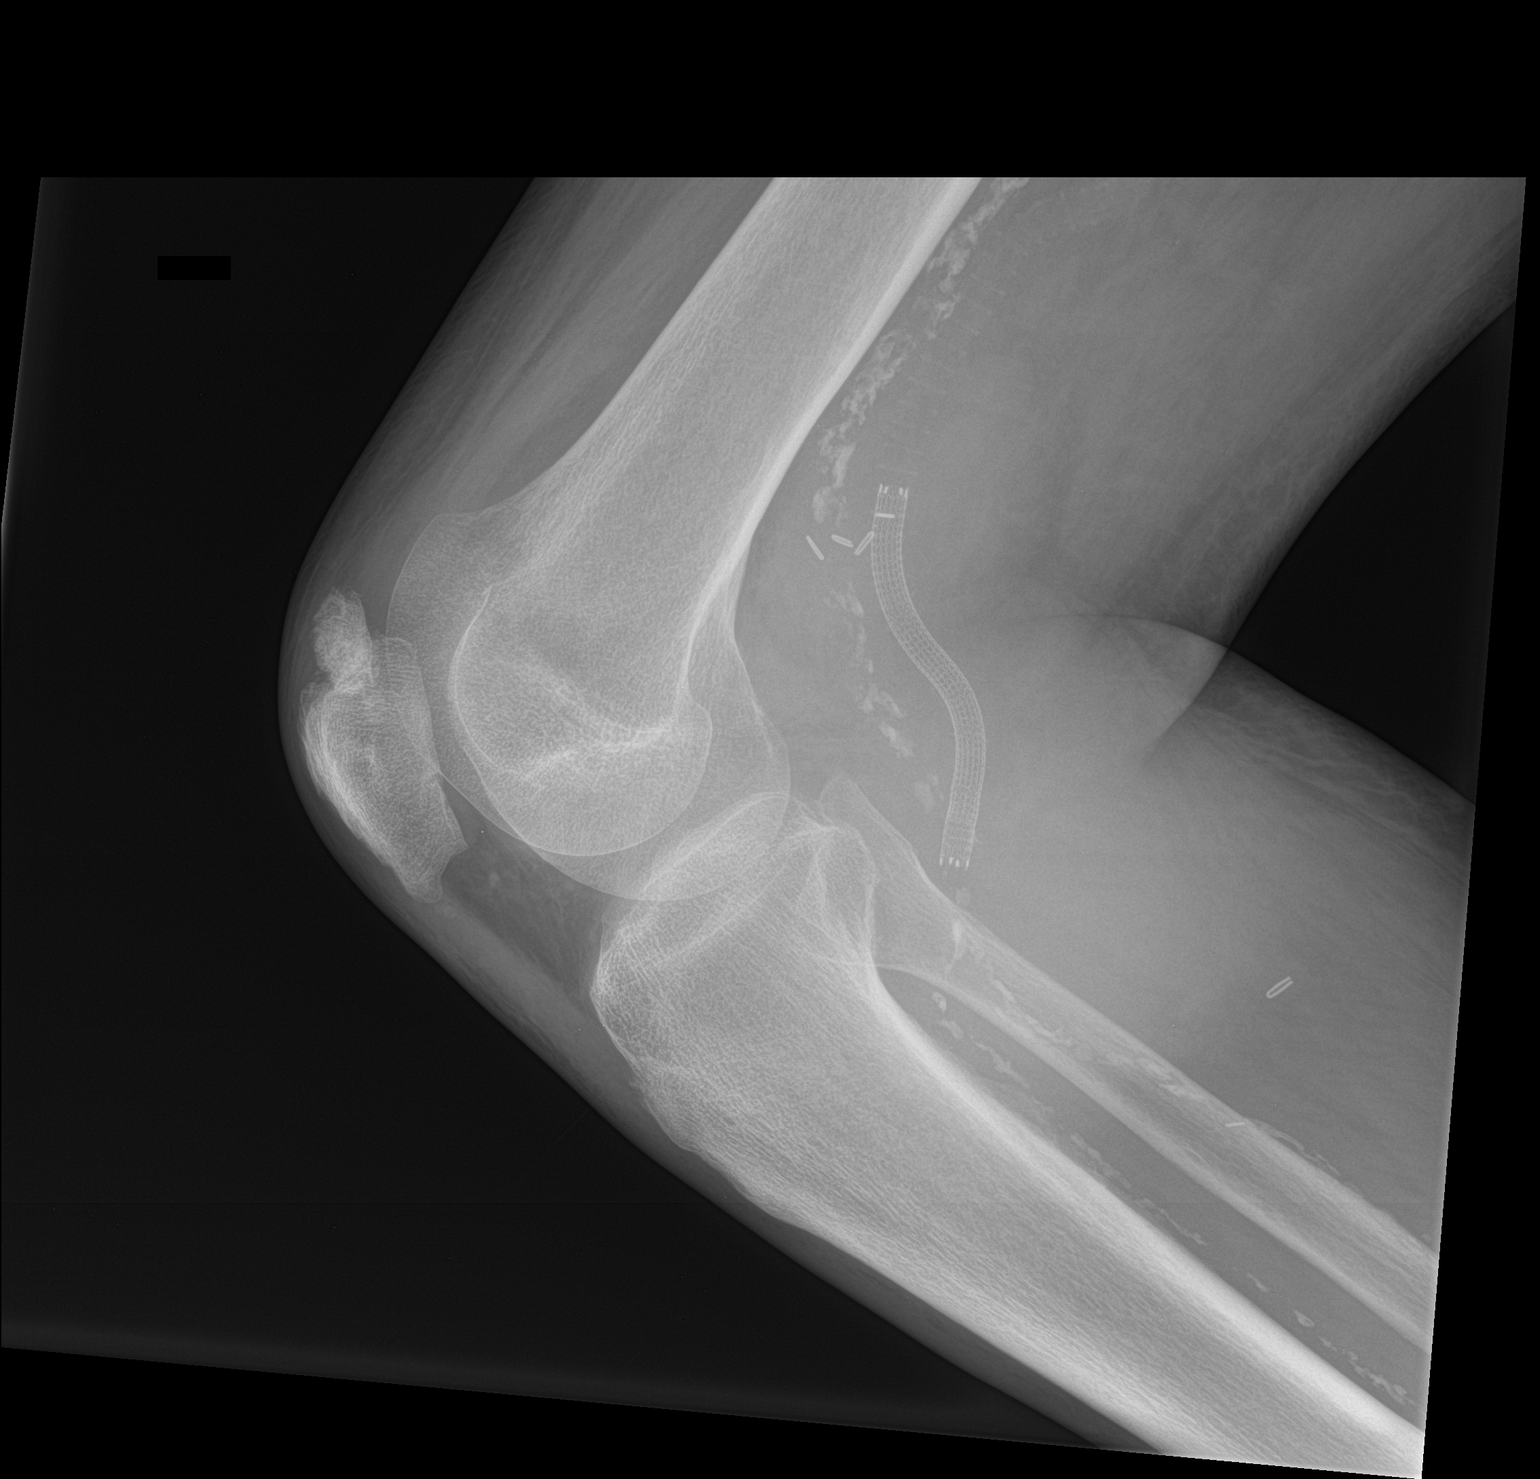
[im 2/3]
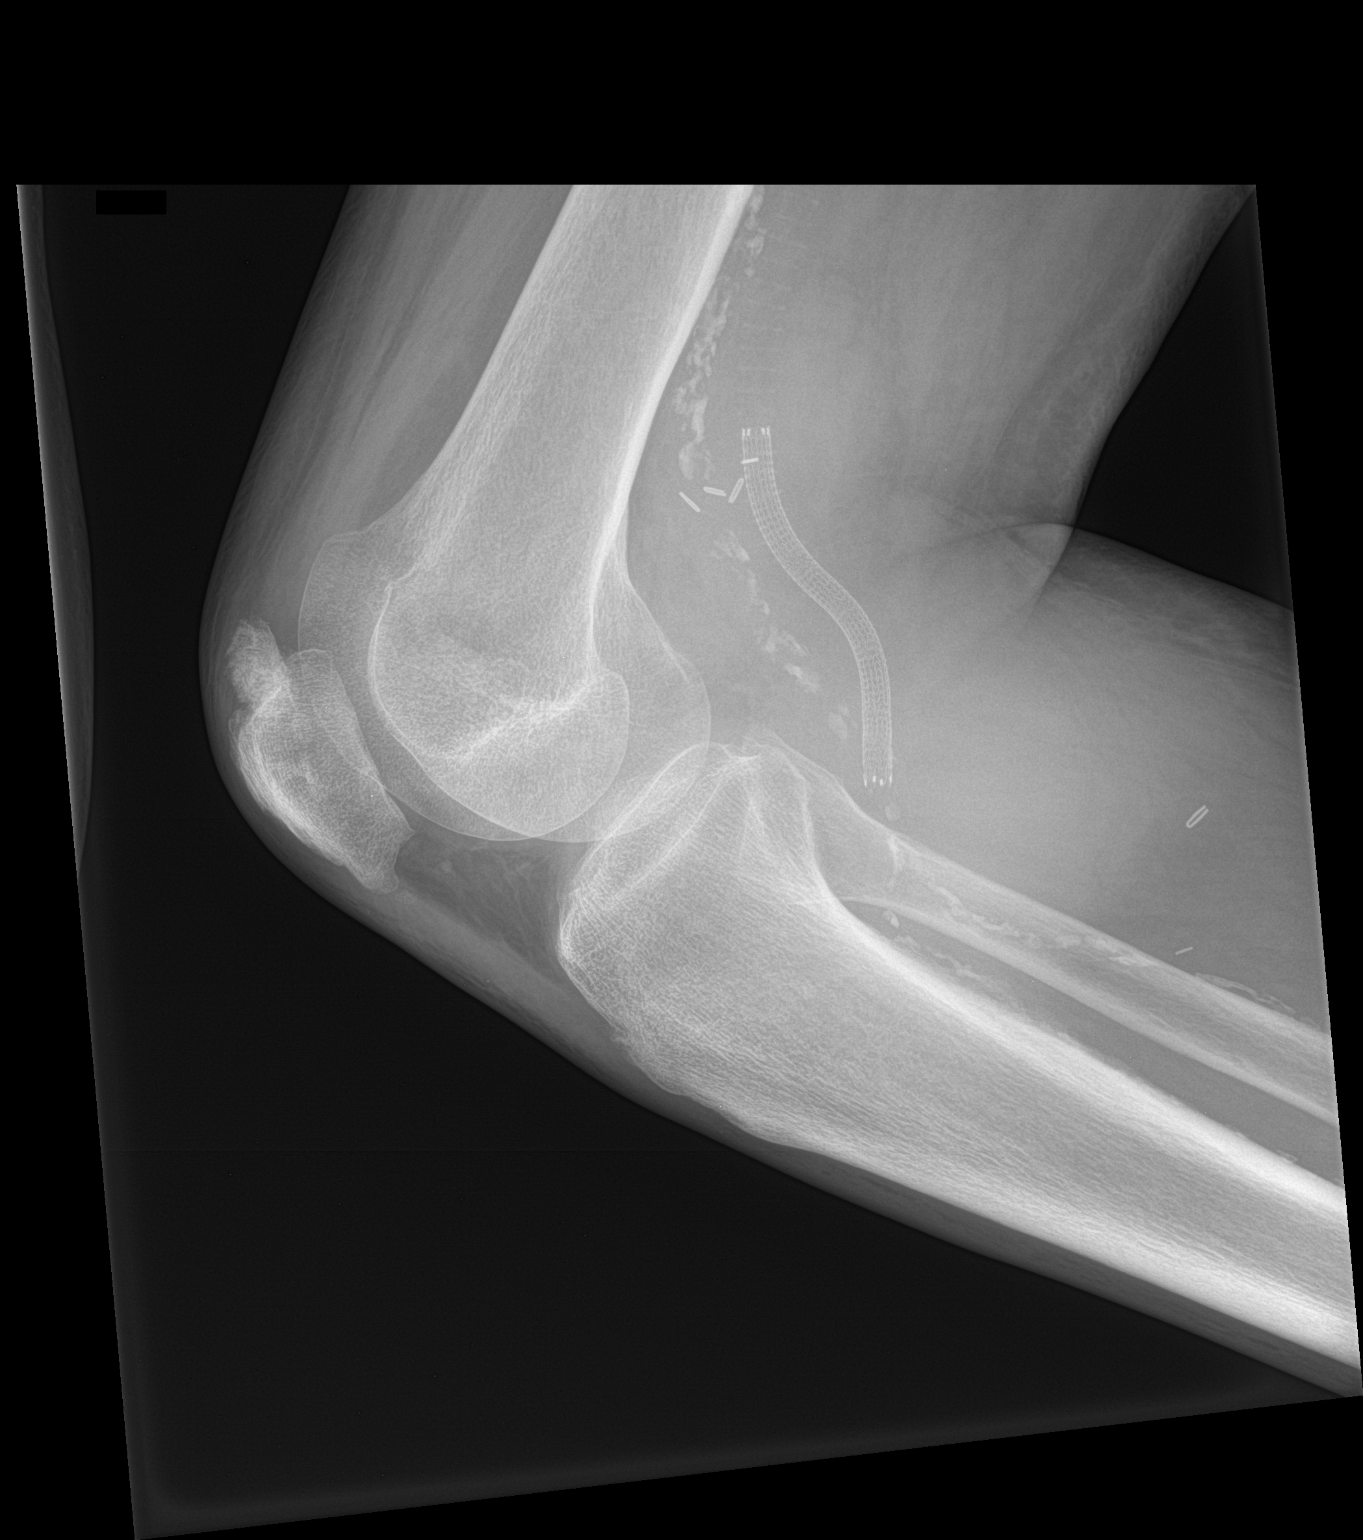
[im 3/3]
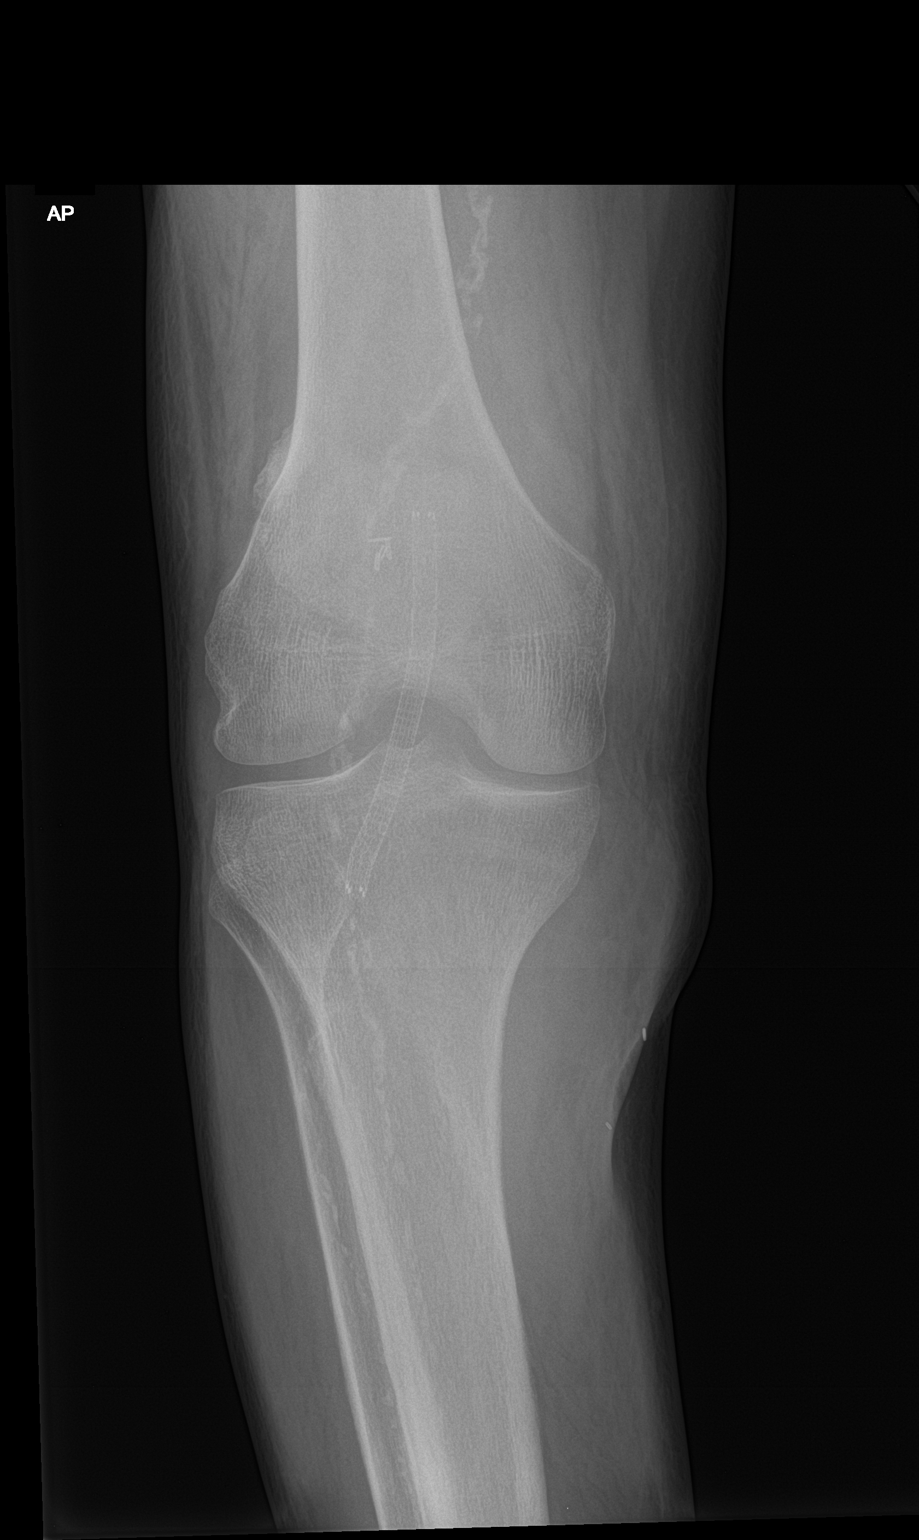

[3 of 3 positions shown; findings below may reference images not displayed]

FINDINGS: No fracture or dislocation is noted. No joint effusion is noted.
Probable large osteophyte is seen arising from the superior patella.
Vascular calcifications are noted. Vascular stent is noted in
popliteal fossa. No significant joint space narrowing is noted.
IMPRESSION: No definite acute abnormality seen. Chronic findings as described
above.

## 2021-06-21 MED ORDER — OXYCODONE HCL 5 MG PO TABS
5.0000 mg | ORAL_TABLET | Freq: Three times a day (TID) | ORAL | Status: DC | PRN
  Administered 2021-06-21 – 2021-06-29 (×11): 5 mg via ORAL
  Filled 2021-06-21 (×12): qty 1

## 2021-06-21 NOTE — Progress Notes (Signed)
PROGRESS NOTE  Yoni Lobos Mckenzie ZJI:[id number] DOB: Dec 16, 1951   PCP: [name], MD  Patient is from: Home  DOA: 06/15/2021 LOS: 5  Chief complaints:  Confusion, generalized weakness and urinary retention  Brief Narrative / Interim history: 70 year old M with PMH of dementia, PAD s/p mechanical thrombectomy and stenting of occluded femoral popliteal bypass on 6/18, ongoing tobacco use, HTN, HLD, BPH, GERD, anxiety and depression returning with confusion, generalized weakness and urinary retention.  Patient was discharged to SNF and went home from SNF on 06/14/2021.    In ED, RLQ drain in situ with bladder scan showing 900 cc, AKI and possible UTI and right foot infection.  Hemodynamically stable.  Mild temp to 99.1.  Initially saturating at 99% on RA. Cr 1.49 (baseline 0.91).  COVID-19 PCR returned positive.  Patient was started on ceftriaxone for UTI and Zyvox for possible right foot infection, and Decadron and remdesivir for COVID-19 infection with desaturation to 93% on RA.  Vascular surgery and ID consulted.  No concern for foot infection at this time.  ID recommended IV cefepime for UTI given prior history of Pseudomonas.  Completed 3 days of remdesivir and 4 days of Decadron.  Encephalopathy and urinary tension resolved.  Therapy recommended SNF.  Medically stable for discharge to SNF.  He will be discharged with indwelling Foley for urinary retention and bladder over distention.  Subjective: Seen and examined earlier this morning.  Per overnight RN, "Pt c/o pain 9/10, very agitated yelling at staff , refused CHG bath, and lab draws".  Patient was somewhat angry when I walked in.  He says he wants to go home.  He also complains pain around his right knee.  He denies chest pain, dyspnea or GI symptoms.  Objective: Vitals:   06/20/21 1801 06/20/21 2132 06/21/21 0503 06/21/21 1109  BP: 121/71 114/70 125/85 116/84  Pulse: 83 89 95 96  Resp: 18   18  Temp: 98.5 F (36.9 C) 98.5 F  (36.9 C) 98.5 F (36.9 C) 98.4 F (36.9 C)  TempSrc:  Oral Oral Oral  SpO2: 97% 100%  100%  Weight:      Height:        Intake/Output Summary (Last 24 hours) at 06/21/2021 1414 Last data filed at 06/21/2021 1300 Gross per 24 hour  Intake 160 ml  Output 400 ml  Net -240 ml   Filed Weights   06/16/21 0300  Weight: 84.9 kg    Examination:  GENERAL: Seems to be in pain.  Nontoxic. HEENT: MMM.  Vision and hearing grossly intact.  NECK: Supple.  No apparent JVD.  RESP:  No IWOB.  Fair aeration bilaterally. CVS:  RRR. Heart sounds normal.  ABD/GI/GU: BS+. Abd soft, NTND.  Indwelling Foley. MSK/EXT:  Moves extremities.  RLE edema.  Effusion/swelling inferomedial to right knee and in right popliteal fossa.  Seems to be worse today. SKIN: Skin exfoliation/wound over the dorsal aspect of right foot. NEURO: Awake and alert.  Fairly oriented but did not assess as these questions upsets him.no focal neurodeficits. PSYCH: Somewhat angry but calms down.  Procedures:  None  Microbiology summarized: COVID-19 PCR positive. MRSA PCR screen positive. Blood cultures NGTD. Urine culture with multiple species.  Assessment & Plan: AKI/azotemia: Likely mix of prerenal and postobstructive. AKI resolved. Acute urinary retention with bladder over distention: Initially passed voiding trial but had 1.2 L UOP with cath on 6/29. Recent Labs    06/07/21 1025 06/08/21 0317 06/10/21 0249 06/15/21 1809 06/16/21 0553 06/17/21  0015 06/18/21 1204 06/19/21 1357 06/20/21 0336 06/21/21 0603  BUN 32* CREATININE 1.00 0.98 0.91 1.49* 0.97 0.87 0.74 0.86 0.88 1.00  -Continue monitoring -Continue indwelling Foley for bladder rest -Outpatient follow-up with urology in about 1 week after discharge for voiding trial -Continue Flomax and Urecholine  Right foot ulcer/wound-low suspicion for foot infection by all experts PAD s/p recent mechanical thrombectomy and stent of  occluded femoropopliteal bypass Right lower extremity pain-felt to be hematoma from vascular procedure.  Seems to be in a lot of pain today -Wound seems to be about the same but significant edema in right leg. -Zyvox>> vancomycin>> off per ID.  Low suspicion for foot infection. -Appreciate recommendation by infectious disease-discontinued antibiotics. -Swelling inferomedial to his right knee seems to be worse.  Seems to be in pain as well. -Has persistent leukocytosis also improving.  Thrombocytosis worse. -Continue Tylenol and tramadol as needed.  Resumed oxycodone as needed for severe pain. -Check right knee x-ray.  May need CT and MRI  Acute metabolic encephalopathy/delirium-could be from UTI, COVID-19 infection, polypharmacy, delirium in the setting of dementia.  Somewhat angry this morning.  Could be due to pain -Treat treatable causes -Reorientation and delirium precautions -May have to cut down on his Seroquel, gabapentin and Klonopin if worse. -Manage pain as above   Acute COVID-19 pneumonia: symptomatic for 1 day. Tested positive on 6/25.  Desaturated to 93% on RA.  CXR with increased patchy opacities RIGHT lung base. Cardiomegaly and mild diffuse pulmonary interstitial congestion. CRP 29. Now saturating at 98% on RA.  Recent Labs    06/19/21 1357 06/20/21 0336 06/21/21 0603  DDIMER 2.43* 2.02* 1.57*  FERRITIN 62  --   --   CRP 7.6* 14.1* 23.9*  -Completed 3 days of remdesivir and 4 days of Decadron -Subcu Lovenox for VTE prophylaxis. -Protonix for GI prophylaxis -Monitor inflammatory markers.   Elevated D-dimer: Likely due to COVID infection.  Low suspicion for PE.  LE [place] negative for DVT.  Presumptive UTI: UA with moderate pyuria and bacteriuria.  Urine culture with multiple species.  -Escalated from IV CTX to IV cefepime by ID given history of Pseudomonas.  Last dose on 6/29  Elevated liver enzymes: could be due to COVID-19 infection, remdesivir and a/or statin.  CK  within normal.  RUQ [place] suggestive for hepatic steatosis otherwise negative.  HIV and acute hepatitis panel negative.  Improving. -Off remdesivir.  -Discontinued statin -Continue trending   ICM/chronic combined CHF: TTE in 2018 with LVEF of 40 to 45%, G1-DD.  Appears to have significant edema in RLE likely from recent revascularization.  However, CXR with some interstitial venous congestion.  BNP 365 but no prior to compare to.  Net -9 L so far. Renal function stable. -Continue home Lasix. -Monitor fluid and respiratory status  History of CAD s/p CABG: Stable. -Continue Lopressor, aspirin, Plavix   Chronic anxiety/depression: Stable -Continue home meds   Essential hypertension: Normotensive. -Cardiac meds as above   Hyperlipidemia -Holding Lipitor due to elevated liver enzymes   Iron deficiency anemia/ anemia of chronic disease: Hgb dropped about 2 g since admission.  Seems to have dark stool but no hematochezia Recent Labs    06/09/21 1828 06/10/21 0249 06/15/21 1809 06/16/21 0553 06/17/21 0015 06/17/21 1133 06/18/21 1204 06/19/21 1357 06/20/21 0336 06/21/21 0603  HGB 8.7* 8.6* 8.9* 8.4* 7.1* 8.0* 8.5* 9.3* 8.7* 8.3*  -H&H stable. -Goal Hgb >8.0 given history of CAD  GERD -Continue PPI   Generalized weakness: Ambulates using walker at baseline. -PT/OT-recommended SNF   Tobacco use disorder: smokes 2 to 3 cigarettes/day -Counseled family. -Nicotine patch  Leukocytosis/bandemia-could be due to steroid but cannot completely exclude infectious process.  Fortunately leukocytosis is improving after stopping steroid. -Check right knee x-ray  Acute thrombocytosis: Acute rise favors reactive process likely from infection or Decadron.  Doubt malignancy.  He is now off Decadron.  -Assess for infection as above -Continue monitoring  Constipation: seems to have resolved. -Senokot-S twice daily  -MiraLAX twice daily as needed  Goal of care counseling: Discussed with  patient's daughter, Sunny SchleinFelicia.  [name] says patient has DNR and DNI. I have changed CODE STATUS to DNR/DNI and notify patient's RN.  Body mass index is 24.03 kg/m.         DVT prophylaxis:  enoxaparin (LOVENOX) injection 40 mg Start: 06/15/21 2200 SCDs Start: 06/15/21 2150  Code Status: DNR/DNI Family Communication: Patient and/or RN.  Updated patient's daughter over the phone. Level of care: Telemetry Medical Status is: Inpatient  Remains inpatient appropriate because:Ongoing diagnostic testing needed not appropriate for outpatient work up, Unsafe d/c plan, and Inpatient level of care appropriate due to severity of illness  Dispo:  Patient From: Home  Planned Disposition: Skilled Nursing Facility  Medically stable for discharge: No       Consultants:  Vascular surgery Infectious disease   Sch Meds:  Scheduled Meds:  aspirin EC  81 mg Oral Daily   bethanechol  10 mg Oral TID   Chlorhexidine Gluconate Cloth  6 each Topical Daily   Chlorhexidine Gluconate Cloth  6 each Topical Q0600   citalopram  20 mg Oral Daily   clopidogrel  75 mg Oral Daily   enoxaparin (LOVENOX) injection  40 mg Subcutaneous Q24H   ferrous fumarate-b12-vitamic C-folic acid  1 capsule Oral Daily   furosemide  20 mg Oral Daily   gabapentin  100 mg Oral BID   irbesartan  75 mg Oral Daily   melatonin  10 mg Oral QHS   metoprolol tartrate  12.5 mg Oral BID   multivitamin with minerals  1 tablet Oral Daily   mupirocin ointment  1 application Nasal BID   nicotine  7 mg Transdermal Daily   pantoprazole  40 mg Oral QPM   QUEtiapine  200 mg Oral BID   senna-docusate  1 tablet Oral BID   sodium chloride flush  3 mL Intravenous Once   tamsulosin  0.8 mg Oral QPM   Continuous Infusions:   PRN Meds:.acetaminophen, albuterol, clonazePAM, guaiFENesin-dextromethorphan, oxyCODONE, polyethylene glycol, prochlorperazine, traMADol  Antimicrobials: Anti-infectives (From admission, onward)    Start      Dose/Rate Route Frequency Ordered Stop   06/17/21 1400  ceFEPIme (MAXIPIME) 2 g in sodium chloride 0.9 % 100 mL IVPB        2 g 200 mL/hr over 30 Minutes Intravenous Every 12 hours 06/17/21 1258 06/19/21 0955   06/17/21 1000  remdesivir 100 mg in sodium chloride 0.9 % 100 mL IVPB       See Hyperspace for full Linked Orders Report.   100 mg 200 mL/hr over 30 Minutes Intravenous Daily 06/16/21 0135 06/18/21 1026   06/16/21 2200  cefTRIAXone (ROCEPHIN) 2 g in sodium chloride 0.9 % 100 mL IVPB  Status:  Discontinued        2 g 200 mL/hr over 30 Minutes Intravenous Every 24 hours 06/15/21 2304 06/17/21 1218   06/16/21 2200  vancomycin (VANCOREADY) IVPB 1000  mg/200 mL  Status:  Discontinued        1,000 mg 200 mL/hr over 60 Minutes Intravenous Every 12 hours 06/16/21 0820 06/17/21 1218   06/16/21 0915  vancomycin (VANCOREADY) IVPB 1750 mg/350 mL        1,750 mg 175 mL/hr over 120 Minutes Intravenous  Once 06/16/21 0820 06/16/21 1114   06/16/21 0230  remdesivir 200 mg in sodium chloride 0.9% 250 mL IVPB       See Hyperspace for full Linked Orders Report.   200 mg 580 mL/hr over 30 Minutes Intravenous Once 06/16/21 0135 06/16/21 0324   06/15/21 2230  linezolid (ZYVOX) IVPB 600 mg  Status:  Discontinued        600 mg 300 mL/hr over 60 Minutes Intravenous Every 12 hours 06/15/21 2228 06/16/21 0811   06/15/21 2115  cefTRIAXone (ROCEPHIN) 2 g in sodium chloride 0.9 % 100 mL IVPB        2 g 200 mL/hr over 30 Minutes Intravenous  Once 06/15/21 2114 06/15/21 2328        I have personally reviewed the following labs and images: CBC: Recent Labs  Lab 06/17/21 0015 06/17/21 1133 06/18/21 1204 06/19/21 1357 06/20/21 0336 06/21/21 0603  WBC 19.7* 20.9* 20.3* 35.7* 25.4* 20.1*  NEUTROABS 16.1*  --  17.2* 32.4* 20.9* 16.6*  HGB 7.1* 8.0* 8.5* 9.3* 8.7* 8.3*  HCT 23.1* 25.8* 27.4* 30.3* 27.6* 27.5*  MCV 71.1* 71.7* 70.6* 71.6* 70.2* 71.8*  PLT 510* 572* 724* 870* 867* 919*   BMP  &GFR Recent Labs  Lab 06/17/21 0015 06/18/21 1204 06/19/21 1357 06/20/21 0336 06/21/21 0603  NA 135 134* 131* 132* 133*  K 4.0 4.3 4.5 4.3 4.3  CL 105 103 98 102 101  CO2 20* 21* 21* 22 22  GLUCOSE 119* 125* 147* 118* 141*  BUN CREATININE 0.87 0.74 0.86 0.88 1.00  CALCIUM 8.1* 8.8* 9.2 9.0 9.1  MG 2.1 2.0 2.0 2.1 2.2  PHOS 2.3* 2.8 3.5 3.8 3.2   Estimated Creatinine Clearance: 82.2 mL/min (by C-G formula based on SCr of 1 mg/dL). Liver & Pancreas: Recent Labs  Lab 06/17/21 0015 06/18/21 1204 06/19/21 1357 06/20/21 0336 06/21/21 0603  AST 152* 215* 114* 69* 43*  ALT 146* 264* 223* 173* 131*  ALKPHOS 126 122 135* 125 117  BILITOT 0.4 0.4 0.6 0.6 0.9  PROT 6.3* 7.2 7.8 7.2 7.4  ALBUMIN 2.2* 2.6* 3.0* 2.7* 2.7*   No results for input(s): LIPASE, AMYLASE in the last 168 hours. No results for input(s): AMMONIA in the last 168 hours. Diabetic: No results for input(s): HGBA1C in the last 72 hours. No results for input(s): GLUCAP in the last 168 hours. Cardiac Enzymes: Recent Labs  Lab 06/18/21 1204  CKTOTAL 38*   No results for input(s): PROBNP in the last 8760 hours. Coagulation Profile: No results for input(s): INR, PROTIME in the last 168 hours. Thyroid Function Tests: No results for input(s): TSH, T4TOTAL, FREET4, T3FREE, THYROIDAB in the last 72 hours. Lipid Profile: No results for input(s): CHOL, HDL, LDLCALC, TRIG, CHOLHDL, LDLDIRECT in the last 72 hours. Anemia Panel: Recent Labs    06/19/21 1357  FERRITIN 62   Urine analysis:    Component Value Date/Time   COLORURINE YELLOW 06/15/2021 2218   APPEARANCEUR TURBID (A) 06/15/2021 2218   LABSPEC 1.014 06/15/2021 2218   PHURINE 5.0 06/15/2021 2218   GLUCOSEU NEGATIVE 06/15/2021 2218   GLUCOSEU NEGATIVE 04/12/2021 1323   HGBUR  SMALL (A) 06/15/2021 2218   BILIRUBINUR NEGATIVE 06/15/2021 2218   KETONESUR 5 (A) 06/15/2021 2218   PROTEINUR 100 (A) 06/15/2021 2218   UROBILINOGEN 0.2  04/12/2021 1323   NITRITE NEGATIVE 06/15/2021 2218   LEUKOCYTESUR MODERATE (A) 06/15/2021 2218   Sepsis Labs: Invalid input(s): PROCALCITONIN, LACTICIDVEN  Microbiology: Recent Results (from the past 240 hour(s))  MRSA Next Gen by PCR, Nasal     Status: Abnormal   Collection Time: 06/15/21 10:21 PM   Specimen: Nasal Mucosa; Nasal Swab  Result Value Ref Range Status   MRSA by PCR Next Gen DETECTED (A) NOT DETECTED Final    Comment: RESULT CALLED TO, READ BACK BY AND VERIFIED WITH: E,BLUE RN @1153  06/16/21 EB (NOTE) The GeneXpert MRSA Assay (FDA approved for NASAL specimens only), is one component of a comprehensive MRSA colonization surveillance program. It is not intended to diagnose MRSA infection nor to guide or monitor treatment for MRSA infections. Test performance is not FDA approved in patients less than 66 years old. Performed at [facility], [address], [place], [place]   Resp Panel by RT-PCR (Flu A&B, Covid) Nasopharyngeal Swab     Status: Abnormal   Collection Time: 06/15/21 10:36 PM   Specimen: Nasopharyngeal Swab; Nasopharyngeal(NP) swabs in vial transport medium  Result Value Ref Range Status   SARS Coronavirus 2 by RT PCR POSITIVE (A) NEGATIVE Final    Comment: RESULT CALLED TO, READ BACK BY AND VERIFIED WITH: RN EMMANUEL CASTRO BY MESSA H. AT 0110 ON 6 26 2022 (NOTE) SARS-CoV-2 target nucleic acids are DETECTED.  The SARS-CoV-2 RNA is generally detectable in upper respiratory specimens during the acute phase of infection. Positive results are indicative of the presence of the identified virus, but do not rule out bacterial infection or co-infection with other pathogens not detected by the test. Clinical correlation with patient history and other diagnostic information is necessary to determine patient infection status. The expected result is Negative.  Fact Sheet for Patients: 2023  Fact Sheet for  Healthcare Providers: BloggerCourse.com  This test is not yet approved or cleared by the SeriousBroker.it FDA and  has been authorized for detection and/or diagnosis of SARS-CoV-2 by FDA under an Emergency Use Authorization (EUA).  This EUA will remain in effect (meanin g this test can be used) for the duration of  the COVID-19 declaration under Section 564(b)(1) of the Act, 21 U.S.C. section 360bbb-3(b)(1), unless the authorization is terminated or revoked sooner.     Influenza A by PCR NEGATIVE NEGATIVE Final   Influenza B by PCR NEGATIVE NEGATIVE Final    Comment: (NOTE) The Xpert Xpress SARS-CoV-2/FLU/RSV plus assay is intended as an aid in the diagnosis of influenza from Nasopharyngeal swab specimens and should not be used as a sole basis for treatment. Nasal washings and aspirates are unacceptable for Xpert Xpress SARS-CoV-2/FLU/RSV testing.  Fact Sheet for Patients: [place]  Fact Sheet for Healthcare Providers: BloggerCourse.com  This test is not yet approved or cleared by the SeriousBroker.it FDA and has been authorized for detection and/or diagnosis of SARS-CoV-2 by FDA under an Emergency Use Authorization (EUA). This EUA will remain in effect (meaning this test can be used) for the duration of the COVID-19 declaration under Section 564(b)(1) of the Act, 21 U.S.C. section 360bbb-3(b)(1), unless the authorization is terminated or revoked.  Performed at [facility], [address] [place], [place]   Culture, blood (routine x 2)     Status:  None   Collection Time: 06/16/21  2:05 AM   Specimen: BLOOD LEFT HAND  Result Value Ref Range Status   Specimen Description BLOOD LEFT HAND  Final   Special Requests   Final    BOTTLES DRAWN AEROBIC ONLY Blood Culture adequate volume   Culture   Final    NO GROWTH 5 DAYS Performed at [facility], [address]  [place], [place] [address]    Report Status 06/21/2021 FINAL  Final  Culture, blood (routine x 2)     Status: None   Collection Time: 06/16/21  2:26 AM   Specimen: BLOOD RIGHT HAND  Result Value Ref Range Status   Specimen Description BLOOD RIGHT HAND  Final   Special Requests   Final    BOTTLES DRAWN AEROBIC ONLY Blood Culture adequate volume   Culture   Final    NO GROWTH 5 DAYS Performed at [facility], [address], [place], [place] [address]    Report Status 06/21/2021 FINAL  Final  Culture, Urine     Status: Abnormal   Collection Time: 06/17/21  3:11 PM   Specimen: Urine, Random  Result Value Ref Range Status   Specimen Description URINE, RANDOM  Final   Special Requests   Final    NONE Performed at [facility], [address], [place], [place] [address]    Culture MULTIPLE SPECIES PRESENT, SUGGEST RECOLLECTION (A)  Final   Report Status 06/18/2021 FINAL  Final    Radiology Studies: No results found.    Ronen Bromwell T. Avleen Bordwell [facility]  If 7PM-7AM, please contact night-coverage [email or web address] 06/21/2021, 2:14 PM

## 2021-06-21 NOTE — Progress Notes (Signed)
Pt c/o pain 9/10, very agitated yelling at staff , refused CHG bath, and lab draws.

## 2021-06-21 NOTE — TOC Progression Note (Addendum)
Transition of Care [facility]) - Progression Note    Patient Details  Name: Lonnie [name] MRN: [id number] Date of Birth: 09-18-1951  Transition of Care [facility]) CM/SW Contact  Okey Dupre Lazaro Arms, LCSW Phone Number: 06/21/2021, 12:57 PM  Clinical Narrative:  Talked with wife by phone regarding [phone]) patient's SNF placement at discharge. Bed offers provided again as an additional facility had responded: [facility], [facility] and [facility]. She was informed that [facility] declined and when asked about [facility], informed her that they declined. Mrs. Gibbard will talk with her daughter and get back with CSW. Wife informed that per MD's note, her husband is not medically stable for discharge, however CSW wants to ensure patient has somewhere to go when medically stable, and wife expressed understanding.   4:57 pm: Received a voice mail from Mrs. Pendell. She has not come to any resolution yet. The doctor called and her husband is having more complications. She will talk with CSW next week.    Barriers to Discharge: Continued Medical Work up  Expected Discharge Plan and Services - SNF                                                 Social Determinants of Health (SDOH) Interventions  No SDOH interventions requested or needed at this time.  Readmission Risk Interventions Readmission Risk Prevention Plan 06/14/2021 09/12/2020 03/12/2020  Post Dischage Appt Complete - -  Medication Screening Complete - -  Transportation Screening Complete Complete -  PCP or Specialist Appt within 3-5 Days - Complete -  HRI or Home Care Consult - Complete -  Social Work Consult for Recovery Care Planning/Counseling - Complete -  Palliative Care Screening - Not Applicable -  Medication Review Oceanographer) - Complete Complete  Some recent data might be hidden

## 2021-06-21 NOTE — Plan of Care (Signed)
  Problem: Health Behavior/Discharge Planning: Goal: Ability to manage health-related needs will improve Outcome: Progressing   Problem: Nutrition: Goal: Adequate nutrition will be maintained Outcome: Progressing   Problem: Safety: Goal: Ability to remain free from injury will improve Outcome: Progressing   Problem: Urinary Elimination: Goal: Signs and symptoms of infection will decrease Outcome: Progressing

## 2021-06-21 NOTE — Consult Note (Signed)
   [facility] Inpatient Consult   06/21/2021  Lonnie [name] [birth year] [id number]  [place] [facility] [ACO] Patient: [employer] Medicare  Primary Care Provider:  Colon Branch, MD, [place]  Patient screened for readmission in less than 7 days hospitalization with noted high risk score for unplanned readmission risk.   Review of patient's medical record Pt/OT and TOC team notes reveals patient is currently being recommended for a skilled nursing facility level of care.  Plan:  Continue to follow progress and disposition to assess for post hospital care management needs.  If patient transitions to a SNF level of care for transition then needs are to be met at that disposition for post hospital care needs.  For questions contact:   Natividad Brood, RN BSN [facility] Liaison  [phone] business mobile phone Toll free office [phone]  Fax number: [phone] [email or web address]

## 2021-06-21 NOTE — Care Management Important Message (Signed)
Important Message  Patient Details  Name: KHALEEM BURCHILL MRN: [id number] Date of Birth: 12/06/1951   Medicare Important Message Given:  Yes - Important Message mailed due to current National Emergency  Verbal consent obtained due to current National Emergency  Relationship to patient: Self Contact Name: Kelso Call Date: 06/21/21  Time: 1137 Phone: [phone] Outcome: No Answer/Busy Important Message mailed to: Patient address on file    Orson Aloe 06/21/2021, 11:37 AM

## 2021-06-21 NOTE — Discharge Summary (Signed)
Discharge Summary    Lonnie [name] [birth year] 70 y.o. male  [id number]  Admission Date: 06/07/2021  Discharge Date: 06/14/2021  Physician: No att. providers found  Admission Diagnosis: PAD (peripheral artery disease) (HCC) [I73.9]   HPI:   This is a 70 y.o. male with complaints of right lower extremity rest pain and skin discoloration for approximately one week. He ambulates with a rolling walker but has not been able to use this for the past three days due to decrease mobility in foot and ankle. Denies left lower extremity pain or tissue loss. Has been compliant with statin, aspirin and Plavix. No anticoagulants.  Hospital Course:  The patient was admitted to the hospital and taken to the [facility] lab on 06/07/2021 and underwent: Abdominal aortogram with right lower extremity runoff placement of thrombolytics catheter, ultrasound left groin    Findings: 1 patent aortoiliac system 2.  Occluded right femoral below-knee popliteal bypass 3.  Thrombolytic infusion wire placed with distal tip approximately 3 cm above the distal anastomosis 4.  Small blush of contrast extraluminal   The pt tolerated the procedure well and was transported to the PACU in good condition.   On 06/08/2021, pt was taken back for: 1.  Follow-up thrombolytic angiogram 2.  Mechanical thrombectomy of the right femoral-popliteal bypass graft 3.  Stent x2 of the right femoral-popliteal bypass graft 4.  Closure device, Mynx  Findings: Right Lower Extremity: The right common femoral and profundofemoral artery were widely patent.  The femoral popliteal bypass graft is now patent.  There is a retained area of thrombus just beyond the origin of the graft.  There is luminal irregularity at the distal anastomosis.  The below-knee popliteal artery is patent but small in caliber.  The posterior tibial artery is the dominant runoff however just above the ankle this collateralizes to get blood flow to the foot  The  remainder of his hospitalization consisted of wound care for right leg with xeroform.  His hospitalization was prolonged by his caregivers having covid.   CBC    Component Value Date/Time   WBC 20.1 (H) 06/21/2021 0603   RBC 3.83 (L) 06/21/2021 0603   HGB 8.3 (L) 06/21/2021 0603   HGB 15.7 01/10/2020 1146   HCT 27.5 (L) 06/21/2021 0603   HCT 46.1 01/10/2020 1146   PLT 919 (HH) 06/21/2021 0603   PLT 232 01/10/2020 1146   MCV 71.8 (L) 06/21/2021 0603   MCV 83 01/10/2020 1146   MCH 21.7 (L) 06/21/2021 0603   MCHC 30.2 06/21/2021 0603   RDW 20.4 (H) 06/21/2021 0603   RDW 12.8 01/10/2020 1146   LYMPHSABS 1.7 06/21/2021 0603   LYMPHSABS 2.5 03/01/2015 1414   MONOABS 1.2 (H) 06/21/2021 0603   EOSABS 0.1 06/21/2021 0603   EOSABS 0.4 03/01/2015 1414   BASOSABS 0.1 06/21/2021 0603   BASOSABS 0.1 03/01/2015 1414    BMET    Component Value Date/Time   NA 133 (L) 06/21/2021 0603   NA 142 01/10/2020 1146   K 4.3 06/21/2021 0603   CL 101 06/21/2021 0603   CO2 22 06/21/2021 0603   GLUCOSE 141 (H) 06/21/2021 0603   BUN 22 06/21/2021 0603   BUN 19 01/10/2020 1146   CREATININE 1.00 06/21/2021 0603   CREATININE 0.94 05/24/2021 1549   CALCIUM 9.1 06/21/2021 0603   GFRNONAA >60 06/21/2021 0603   GFRAA >60 09/12/2020 0357      Discharge Instructions     Call MD for:  redness, tenderness, or signs of  infection (pain, swelling, bleeding, redness, odor or green/yellow discharge around incision site)   Complete by: As directed    Call MD for:  severe or increased pain, loss or decreased feeling  in affected limb(s)   Complete by: As directed    Call MD for:  temperature >100.5   Complete by: As directed    Discharge patient   Complete by: As directed    Discharge disposition: 01-Home or Self Care   Discharge patient date: 06/14/2021   Discharge wound care:   Complete by: As directed    Shower daily with soap and water starting 06/14/2021 and then apply Xeroform, kerlix and ace wrap  daily.   Resume previous diet   Complete by: As directed        Discharge Diagnosis:  PAD (peripheral artery disease) (HCC) [I73.9]  Secondary Diagnosis: Patient Active Problem List   Diagnosis Date Noted   AKI (acute kidney injury) (HCC) 06/15/2021   Ischemic cardiomyopathy    Sepsis secondary to UTI (HCC) 09/10/2020   Acute metabolic encephalopathy 09/10/2020   Closed fracture of right patella 09/10/2020   PAD (peripheral artery disease) (HCC) 01/20/2020   Fronto-temporal dementia (HCC) 08/04/2019   Left shoulder pain    TBI (traumatic brain injury) (HCC)    E-coli UTI    Urinary retention    Postoperative pain    Agitation    SDH (subdural hematoma) (HCC) 07/08/2019   Subdural hematoma (HCC) 06/30/2019   Erectile dysfunction 07/28/2018   Peripheral vascular disease (HCC) 07/16/2017   Anxiety and depression 07/16/2017   PCP NOTES >>>>>>>>>>>> 07/08/2016   Dermatitis 04/19/2015   Elevated LFTs 02/12/2015   Hyperglycemia 09/01/2014   DJD (degenerative joint disease) 09/01/2014   Annual physical exam 09/01/2014   S/P CABG x 4 11/07/2013   HTN (hypertension) 10/29/2013   Tobacco abuse 10/29/2013   Coronary atherosclerosis of native coronary artery 10/29/2013   Past Medical History:  Diagnosis Date   Allergy    Anxiety    Arthritis    left neck, shoulder, knee   CAD (coronary artery disease)    a. anterior STEMI 10/2013 s/p 4V CABG with LIMA to mid LAD, SVG to OM, SVG to PDA, SVG to Diagonal.   Chronic combined systolic and diastolic CHF (congestive heart failure) (HCC)    Dementia (HCC)    Depression    Falls    Hypertension    Ischemic cardiomyopathy    a. EF 40-45% at time of CABG and in 2018.   MI (myocardial infarction) (HCC)    Peripheral neuropathy    Prediabetes 09/01/2014   PVD (peripheral vascular disease) (HCC)    a. s/p L SFA stents with now known bilateral SFA. b. right femoral to below the knee bypass by Dr. Myra Gianotti in 12/2019, c/b infection  02/2020.   Stroke [name])    seen on CT Scan   Subdural hematoma (HCC)    Tobacco abuse    UTI (urinary tract infection)      Allergies as of 06/14/2021       Reactions   Ace Inhibitors Swelling   Angioedema   Dairy Aid [lactase]    gas   Eggs Or Egg-derived Products    Cannot eat Prepared Eggs   Latex Itching        Medication List     STOP taking these medications    traMADol 50 MG tablet Commonly known as: ULTRAM       TAKE these medications  acetaminophen 500 MG tablet Commonly known as: TYLENOL Take 1,000 mg by mouth in the morning and at bedtime.   aspirin EC 81 MG tablet Take 81 mg by mouth daily.   BEN GAY EX Apply 1 application topically daily as needed (for back pain).   docusate sodium 100 MG capsule Commonly known as: COLACE Take 100 mg by mouth daily.   gabapentin 100 MG capsule Commonly known as: NEURONTIN Take 1 capsule (100 mg total) by mouth 2 (two) times daily.   irbesartan 300 MG tablet Commonly known as: AVAPRO Take 1 tablet (300 mg total) by mouth daily.   metoprolol tartrate 25 MG tablet Commonly known as: LOPRESSOR Take 1 tablet (25 mg total) by mouth 2 (two) times daily. What changed: when to take this   multivitamin with minerals Tabs tablet Take 1 tablet by mouth daily.   nicotine 7 mg/24hr patch Commonly known as: NICODERM CQ - dosed in mg/24 hr Place 1 patch (7 mg total) onto the skin daily.   oxyCODONE 5 MG immediate release tablet Commonly known as: Roxicodone Take 1 tablet (5 mg total) by mouth every 6 (six) hours as needed.   pantoprazole 40 MG tablet Commonly known as: PROTONIX Take 1 tablet (40 mg total) by mouth at bedtime. What changed: when to take this   QUEtiapine 200 MG tablet Commonly known as: SEROQUEL Take 1 tablet (200 mg total) by mouth 2 (two) times daily. What changed: when to take this       ASK your doctor about these medications    atorvastatin 80 MG tablet Commonly known as:  LIPITOR Take 1 tablet (80 mg total) by mouth daily.   citalopram 20 MG tablet Commonly known as: CELEXA Take 2 tablets (40 mg total) by mouth daily.   clonazePAM 0.25 MG disintegrating tablet Commonly known as: KLONOPIN DISSOLVE 1 TABLET ON THE  TONGUE TWICE DAILY AS  NEEDED   clopidogrel 75 MG tablet Commonly known as: PLAVIX Take 1 tablet (75 mg total) by mouth daily.   ferrous fumarate 325 (106 Fe) MG Tabs tablet Commonly known as: HEMOCYTE - 106 mg FE Take 1 tablet by mouth before lunch and before dinner   furosemide 20 MG tablet Commonly known as: LASIX Take 1 tablet (20 mg total) by mouth daily as needed (ankle swelling).   tamsulosin 0.4 MG Caps capsule Commonly known as: FLOMAX Take 2 capsules (0.8 mg total) by mouth daily after supper.               Discharge Care Instructions  (From admission, onward)           Start     Ordered   06/14/21 0000  Discharge wound care:       Comments: Shower daily with soap and water starting 06/14/2021 and then apply Xeroform, kerlix and ace wrap daily.   06/14/21 1156            Prescriptions given: Oxycodone  Instructions: 1.  Daily dressing changes to right leg with xeroform, kerlix, and ace wrap  Disposition: home with [facility]  Patient's condition: is Limited  Follow up: 1. [facility] in 2 weeks   Doreatha Massed, [facility] [phone] 06/21/2021  2:29 PM

## 2021-06-22 ENCOUNTER — Inpatient Hospital Stay (HOSPITAL_COMMUNITY): Payer: Medicare Other

## 2021-06-22 DIAGNOSIS — G9341 Metabolic encephalopathy: Secondary | ICD-10-CM | POA: Diagnosis not present

## 2021-06-22 DIAGNOSIS — N179 Acute kidney failure, unspecified: Secondary | ICD-10-CM | POA: Diagnosis not present

## 2021-06-22 DIAGNOSIS — F17200 Nicotine dependence, unspecified, uncomplicated: Secondary | ICD-10-CM | POA: Diagnosis not present

## 2021-06-22 DIAGNOSIS — K5900 Constipation, unspecified: Secondary | ICD-10-CM | POA: Diagnosis not present

## 2021-06-22 LAB — CREATININE, SERUM
Creatinine, Ser: 0.87 mg/dL (ref 0.61–1.24)
GFR, Estimated: >60 mL/min (ref >60)

## 2021-06-22 LAB — CBC WITH DIFFERENTIAL/PLATELET
Abs Immature Granulocytes: 0.39 10*3/uL — ABNORMAL HIGH (ref 0.00–0.07)
Basophils Absolute: 0.1 10*3/uL (ref 0.0–0.1)
Basophils Relative: 0 %
Eosinophils Absolute: 0.1 10*3/uL (ref 0.0–0.5)
Eosinophils Relative: 0 %
HCT: 27.9 % — ABNORMAL LOW (ref 39.0–52.0)
Hemoglobin: 8.3 g/dL — ABNORMAL LOW (ref 13.0–17.0)
Immature Granulocytes: 2 %
Lymphocytes Relative: 10 %
Lymphs Abs: 2.2 10*3/uL (ref 0.7–4.0)
MCH: 21.7 pg — ABNORMAL LOW (ref 26.0–34.0)
MCHC: 29.7 g/dL — ABNORMAL LOW (ref 30.0–36.0)
MCV: 73 fL — ABNORMAL LOW (ref 80.0–100.0)
Monocytes Absolute: 1.6 10*3/uL — ABNORMAL HIGH (ref 0.1–1.0)
Monocytes Relative: 7 %
Neutro Abs: 17.4 10*3/uL — ABNORMAL HIGH (ref 1.7–7.7)
Neutrophils Relative %: 81 %
Platelets: 950 10*3/uL (ref 150–400)
RBC: 3.82 MIL/uL — ABNORMAL LOW (ref 4.22–5.81)
RDW: 20.2 % — ABNORMAL HIGH (ref 11.5–15.5)
WBC: 21.6 10*3/uL — ABNORMAL HIGH (ref 4.0–10.5)
nRBC: 0 % (ref 0.0–0.2)

## 2021-06-22 LAB — RENAL FUNCTION PANEL
Albumin: 2.8 g/dL — ABNORMAL LOW (ref 3.5–5.0)
Anion gap: 12 (ref 5–15)
BUN: 20 mg/dL (ref 8–23)
CO2: 22 mmol/L (ref 22–32)
Calcium: 9.3 mg/dL (ref 8.9–10.3)
Chloride: 101 mmol/L (ref 98–111)
Creatinine, Ser: 0.82 mg/dL (ref 0.61–1.24)
GFR, Estimated: >60 mL/min (ref >60)
Glucose, Bld: 115 mg/dL — ABNORMAL HIGH (ref 70–99)
Phosphorus: 3.3 mg/dL (ref 2.5–4.6)
Potassium: 4.8 mmol/L (ref 3.5–5.1)
Sodium: 135 mmol/L (ref 135–145)

## 2021-06-22 LAB — MAGNESIUM: Magnesium: 2.5 mg/dL — ABNORMAL HIGH (ref 1.7–2.4)

## 2021-06-22 LAB — PATHOLOGIST SMEAR REVIEW

## 2021-06-22 MED ORDER — IOHEXOL 300 MG/ML  SOLN
100.0000 mL | Freq: Once | INTRAMUSCULAR | Status: AC | PRN
  Administered 2021-06-22: 100 mL via INTRAVENOUS

## 2021-06-22 NOTE — Plan of Care (Signed)
  Problem: Health Behavior/Discharge Planning: Goal: Ability to manage health-related needs will improve Outcome: Progressing   Problem: Nutrition: Goal: Adequate nutrition will be maintained Outcome: Progressing   Problem: Coping: Goal: Level of anxiety will decrease Outcome: Progressing   Problem: Pain Managment: Goal: General experience of comfort will improve Outcome: Progressing   Problem: Safety: Goal: Ability to remain free from injury will improve Outcome: Progressing   

## 2021-06-22 NOTE — Progress Notes (Signed)
PROGRESS NOTE  Lonnie [name] KNL:[id number] DOB: [birth year]   PCP: [name], MD  Patient is from: Home  DOA: 06/15/2021 LOS: 6  Chief complaints:  Confusion, generalized weakness and urinary retention  Brief Narrative / Interim history: 70 year old M with PMH of dementia, PAD s/p mechanical thrombectomy and stenting of occluded femoral popliteal bypass on 6/18, ongoing tobacco use, HTN, HLD, BPH, GERD, anxiety and depression returning with confusion, generalized weakness and urinary retention, and admitted for encephalopathy, AKI, UTI and also tested positive for COVID-19.  AKI resolved.  Briefly treated for UTI and COVID-19.  Therapy recommended SNF.  Vascular surgery and ID gave recommendations and signed off. He failed voiding trial.  He will be discharged with indwelling Foley for bladder rest.  Needs outpatient urology follow-up. He is still have leukocytosis and thrombocytosis.  Per ID, this is likely from his recent Decadron.  Therapy recommended SNF.  Of note, he refused SNF last hospitalization but seems to be in agreement now.  Subjective: Seen and examined earlier this morning.  No major events overnight of this morning.  No complaints other than intermittent pain in his right leg.  He says he is not in pain now.  He is just curious about what CT could show.  He denies chest pain, shortness of breath or GI symptoms.  Objective: Vitals:   06/21/21 2110 06/21/21 2115 06/22/21 0612 06/22/21 0945  BP: 113/65 116/71 (!) 139/123 (!) 143/96  Pulse: 83 73 98 99  Resp: 16 18 18 20   Temp: 97.9 F (36.6 C) 98.1 F (36.7 C) 98.3 F (36.8 C) 98.4 F (36.9 C)  TempSrc: Oral Oral Oral Oral  SpO2: 100% 100% 98% 98%  Weight:      Height:        Intake/Output Summary (Last 24 hours) at 06/22/2021 1614 Last data filed at 06/22/2021 0900 Gross per 24 hour  Intake 1120 ml  Output 1850 ml  Net -730 ml   Filed Weights   06/16/21 0300  Weight: 84.9 kg    Examination:  PSYCH:  Somewhat angry but calms down. GENERAL: No apparent distress.  Nontoxic. HEENT: MMM.  Vision and hearing grossly intact.  NECK: Supple.  No apparent JVD.  RESP: On RA.  No IWOB.  Fair aeration bilaterally. CVS:  RRR. Heart sounds normal.  ABD/GI/GU: BS+. Abd soft, NTND.  Indwelling Foley. MSK/EXT:  Moves extremities.  1+ RLE edema.  Effusion and from medial to right knee and in the right popliteal fossa.  Some tenderness to palpation but no erythema or increased warmth to touch. SKIN: Skin exfoliation/wound over the dorsal aspect of right foot. NEURO: Awake and alert. Oriented appropriately.  No apparent focal neuro deficit. PSYCH: Calm. Normal affect.   Procedures:  None  Microbiology summarized: COVID-19 PCR positive. MRSA PCR screen positive. Blood cultures NGTD. Urine culture with multiple species.  Assessment & Plan: AKI/azotemia: Likely mix of prerenal and postobstructive. AKI resolved. Acute urinary retention with bladder over distention: Initially passed voiding trial but had 1.2 L UOP with cath on 6/29. Recent Labs    06/08/21 0317 06/10/21 0249 06/15/21 1809 06/16/21 0553 06/17/21 0015 06/18/21 1204 06/19/21 1357 06/20/21 0336 06/21/21 0603 06/22/21 0445 06/22/21 0831  BUN 18 18 32* 23 16 18 20 19 22   --  20  CREATININE 0.98 0.91 1.49* 0.97 0.87 0.74 0.86 0.88 1.00 0.87 0.82  -Continue monitoring -Continue indwelling Foley for bladder rest -Outpatient follow-up with urology in about 1 week after discharge for voiding  trial -Continue Flomax and Urecholine  Right foot ulcer/wound-low suspicion for foot infection by all experts PAD s/p recent mechanical thrombectomy and stent of occluded femoropopliteal bypass Right lower extremity pain-likely due to osteoarthritis and hematoma.  -Wound seems to be about the same but significant edema in right leg. -Zyvox>> vancomycin>> off per ID.  Low suspicion for foot infection. -Appreciate recommendation by infectious  disease-discontinued antibiotics. -CT of right knee suggestive for hematoma but cannot exclude infection. -7/2-discussed with ID, Dr. Ninetta Lights who thinks his leukocytosis and thrombocytosis is likely from Decadron -Continue Tylenol, tramadol and oxycodone as needed for pain based on severity  Acute metabolic encephalopathy/delirium-could be from UTI, COVID-19 infection, polypharmacy, delirium in the setting of dementia.  Somewhat angry this morning.  Could be due to pain -Treat treatable causes -Reorientation and delirium precautions -May have to cut down on his Seroquel, gabapentin and Klonopin if worse. -Manage pain as above   Acute COVID-19 pneumonia: symptomatic for 1 day. Tested positive on 6/25.  Desaturated to 93% on RA.  CXR with increased patchy opacities RIGHT lung base. Cardiomegaly and mild diffuse pulmonary interstitial congestion. CRP 29. Now saturating at 98% on RA.  -Completed 3 days of remdesivir and 4 days of Decadron on 6/29 -Subcu Lovenox for VTE prophylaxis. -Supportive care  Elevated D-dimer: Likely due to COVID infection.  Low suspicion for PE.  LE [place] negative for DVT.  Presumptive UTI: UA with moderate pyuria and bacteriuria.  Urine culture with multiple species.  -Completed short course of ceftriaxone and IV cefepime on 6/29. -ID signed off.  Elevated liver enzymes: CK within normal.  RUQ [place] suggestive for hepatic steatosis otherwise negative.  HIV and acute hepatitis panel negative.  Improving off remdesivir and statin. -Continue trending   ICM/chronic combined CHF: TTE in 2018 with LVEF of 40 to 45%, G1-DD.  Appears to have significant edema in RLE likely from recent revascularization.  However, CXR with some interstitial venous congestion.  BNP 365 but no prior to compare to.  Net -9 L so far. Renal function stable. -Continue home Lasix. -Monitor fluid and respiratory status  History of CAD s/p CABG: Stable. -Continue Lopressor, aspirin, Plavix   Chronic  anxiety/depression: Stable -Continue home meds   Essential hypertension: Normotensive. -Cardiac meds as above   Hyperlipidemia -Holding Lipitor due to elevated liver enzymes   Iron deficiency anemia/ anemia of chronic disease: H&H relatively stable. Recent Labs    06/10/21 0249 06/15/21 1809 06/16/21 0553 06/17/21 0015 06/17/21 1133 06/18/21 1204 06/19/21 1357 06/20/21 0336 06/21/21 0603 06/22/21 0831  HGB 8.6* 8.9* 8.4* 7.1* 8.0* 8.5* 9.3* 8.7* 8.3* 8.3*  -H&H stable. -Goal Hgb >8.0 given history of CAD   Generalized weakness: Ambulates using walker at baseline. -PT/OT-recommended SNF   Tobacco use disorder: smokes 2 to 3 cigarettes/day -Counseled family. -Nicotine patch  Leukocytosis/bandemia-demargination in the setting of steroid versus infectious process.  Improving. Acute thrombocytosis: Felt to be reactive in the setting of Decadron.  He is now off Decadron.  -7/2-discussed with ID, Dr. Ninetta Lights who thinks his leukocytosis and thrombocytosis is likely from Decadron  Constipation: seems to have resolved. -Senokot-S twice daily  -MiraLAX twice daily as needed  GERD -Continue PPI  Goal of care counseling: Discussed with patient's daughter, [name].  [name] says patient has DNR and DNI. I have changed CODE STATUS to DNR/DNI and notify patient's RN.  Body mass index is 24.03 kg/m.         DVT prophylaxis:  enoxaparin (LOVENOX) injection 40 mg Start:  06/15/21 2200 SCDs Start: 06/15/21 2150  Code Status: DNR/DNI Family Communication: Patient and/or RN.  Updated patient's daughter over the phone. Level of care: Telemetry Medical Status is: Inpatient  Remains inpatient appropriate because:Unsafe d/c plan  Dispo:  Patient From: Home  Planned Disposition: Skilled Nursing Facility  Medically stable for discharge: Yes       Consultants:  Vascular surgery Infectious disease   Sch Meds:  Scheduled Meds:  aspirin EC  81 mg Oral Daily    bethanechol  10 mg Oral TID   Chlorhexidine Gluconate Cloth  6 each Topical Daily   Chlorhexidine Gluconate Cloth  6 each Topical Q0600   citalopram  20 mg Oral Daily   clopidogrel  75 mg Oral Daily   enoxaparin (LOVENOX) injection  40 mg Subcutaneous Q24H   ferrous fumarate-b12-vitamic C-folic acid  1 capsule Oral Daily   furosemide  20 mg Oral Daily   gabapentin  100 mg Oral BID   irbesartan  75 mg Oral Daily   melatonin  10 mg Oral QHS   metoprolol tartrate  12.5 mg Oral BID   multivitamin with minerals  1 tablet Oral Daily   mupirocin ointment  1 application Nasal BID   nicotine  7 mg Transdermal Daily   pantoprazole  40 mg Oral QPM   QUEtiapine  200 mg Oral BID   senna-docusate  1 tablet Oral BID   sodium chloride flush  3 mL Intravenous Once   tamsulosin  0.8 mg Oral QPM   Continuous Infusions:   PRN Meds:.acetaminophen, albuterol, clonazePAM, guaiFENesin-dextromethorphan, oxyCODONE, polyethylene glycol, prochlorperazine, traMADol  Antimicrobials: Anti-infectives (From admission, onward)    Start     Dose/Rate Route Frequency Ordered Stop   06/17/21 1400  ceFEPIme (MAXIPIME) 2 g in sodium chloride 0.9 % 100 mL IVPB        2 g 200 mL/hr over 30 Minutes Intravenous Every 12 hours 06/17/21 1258 06/19/21 0955   06/17/21 1000  remdesivir 100 mg in sodium chloride 0.9 % 100 mL IVPB       See Hyperspace for full Linked Orders Report.   100 mg 200 mL/hr over 30 Minutes Intravenous Daily 06/16/21 0135 06/18/21 1026   06/16/21 2200  cefTRIAXone (ROCEPHIN) 2 g in sodium chloride 0.9 % 100 mL IVPB  Status:  Discontinued        2 g 200 mL/hr over 30 Minutes Intravenous Every 24 hours 06/15/21 2304 06/17/21 1218   06/16/21 2200  vancomycin (VANCOREADY) IVPB 1000 mg/200 mL  Status:  Discontinued        1,000 mg 200 mL/hr over 60 Minutes Intravenous Every 12 hours 06/16/21 0820 06/17/21 1218   06/16/21 0915  vancomycin (VANCOREADY) IVPB 1750 mg/350 mL        1,750 mg 175 mL/hr over  120 Minutes Intravenous  Once 06/16/21 0820 06/16/21 1114   06/16/21 0230  remdesivir 200 mg in sodium chloride 0.9% 250 mL IVPB       See Hyperspace for full Linked Orders Report.   200 mg 580 mL/hr over 30 Minutes Intravenous Once 06/16/21 0135 06/16/21 0324   06/15/21 2230  linezolid (ZYVOX) IVPB 600 mg  Status:  Discontinued        600 mg 300 mL/hr over 60 Minutes Intravenous Every 12 hours 06/15/21 2228 06/16/21 0811   06/15/21 2115  cefTRIAXone (ROCEPHIN) 2 g in sodium chloride 0.9 % 100 mL IVPB        2 g 200 mL/hr over 30 Minutes Intravenous  Once 06/15/21 2114 06/15/21 2328        I have personally reviewed the following labs and images: CBC: Recent Labs  Lab 06/18/21 1204 06/19/21 1357 06/20/21 0336 06/21/21 0603 06/22/21 0831  WBC 20.3* 35.7* 25.4* 20.1* 21.6*  NEUTROABS 17.2* 32.4* 20.9* 16.6* 17.4*  HGB 8.5* 9.3* 8.7* 8.3* 8.3*  HCT 27.4* 30.3* 27.6* 27.5* 27.9*  MCV 70.6* 71.6* 70.2* 71.8* 73.0*  PLT 724* 870* 867* 919* 950*   BMP &GFR Recent Labs  Lab 06/18/21 1204 06/19/21 1357 06/20/21 0336 06/21/21 0603 06/22/21 0445 06/22/21 0831  NA 134* 131* 132* 133*  --  135  K 4.3 4.5 4.3 4.3  --  4.8  CL 103 98 102 101  --  101  CO2 21* 21* 22 22  --  22  GLUCOSE 125* 147* 118* 141*  --  115*  BUN --  20  CREATININE 0.74 0.86 0.88 1.00 0.87 0.82  CALCIUM 8.8* 9.2 9.0 9.1  --  9.3  MG 2.0 2.0 2.1 2.2  --  2.5*  PHOS 2.8 3.5 3.8 3.2  --  3.3   Estimated Creatinine Clearance: 100.2 mL/min (by C-G formula based on SCr of 0.82 mg/dL). Liver & Pancreas: Recent Labs  Lab 06/17/21 0015 06/18/21 1204 06/19/21 1357 06/20/21 0336 06/21/21 0603 06/22/21 0831  AST 152* 215* 114* 69* 43*  --   ALT 146* 264* 223* 173* 131*  --   ALKPHOS 126 122 135* 125 117  --   BILITOT 0.4 0.4 0.6 0.6 0.9  --   PROT 6.3* 7.2 7.8 7.2 7.4  --   ALBUMIN 2.2* 2.6* 3.0* 2.7* 2.7* 2.8*   No results for input(s): LIPASE, AMYLASE in the last 168 hours. No results  for input(s): AMMONIA in the last 168 hours. Diabetic: No results for input(s): HGBA1C in the last 72 hours. No results for input(s): GLUCAP in the last 168 hours. Cardiac Enzymes: Recent Labs  Lab 06/18/21 1204  CKTOTAL 38*   No results for input(s): PROBNP in the last 8760 hours. Coagulation Profile: No results for input(s): INR, PROTIME in the last 168 hours. Thyroid Function Tests: No results for input(s): TSH, T4TOTAL, FREET4, T3FREE, THYROIDAB in the last 72 hours. Lipid Profile: No results for input(s): CHOL, HDL, LDLCALC, TRIG, CHOLHDL, LDLDIRECT in the last 72 hours. Anemia Panel: No results for input(s): VITAMINB12, FOLATE, FERRITIN, TIBC, IRON, RETICCTPCT in the last 72 hours.  Urine analysis:    Component Value Date/Time   COLORURINE YELLOW 06/15/2021 2218   APPEARANCEUR TURBID (A) 06/15/2021 2218   LABSPEC 1.014 06/15/2021 2218   PHURINE 5.0 06/15/2021 2218   GLUCOSEU NEGATIVE 06/15/2021 2218   GLUCOSEU NEGATIVE 04/12/2021 1323   HGBUR SMALL (A) 06/15/2021 2218   BILIRUBINUR NEGATIVE 06/15/2021 2218   KETONESUR 5 (A) 06/15/2021 2218   PROTEINUR 100 (A) 06/15/2021 2218   UROBILINOGEN 0.2 04/12/2021 1323   NITRITE NEGATIVE 06/15/2021 2218   LEUKOCYTESUR MODERATE (A) 06/15/2021 2218   Sepsis Labs: Invalid input(s): PROCALCITONIN, LACTICIDVEN  Microbiology: Recent Results (from the past 240 hour(s))  MRSA Next Gen by PCR, Nasal     Status: Abnormal   Collection Time: 06/15/21 10:21 PM   Specimen: Nasal Mucosa; Nasal Swab  Result Value Ref Range Status   MRSA by PCR Next Gen DETECTED (A) NOT DETECTED Final    Comment: RESULT CALLED TO, READ BACK BY AND VERIFIED WITH: E,BLUE RN  06/16/21 EB (NOTE) The GeneXpert MRSA Assay (FDA approved  for NASAL specimens only), is one component of a comprehensive MRSA colonization surveillance program. It is not intended to diagnose MRSA infection nor to guide or monitor treatment for MRSA infections. Test performance  is not FDA approved in patients less than 61 years old. Performed at [facility], [address] [place], [place] [address]   Resp Panel by RT-PCR (Flu A&B, Covid) Nasopharyngeal Swab     Status: Abnormal   Collection Time: 06/15/21 10:36 PM   Specimen: Nasopharyngeal Swab; Nasopharyngeal(NP) swabs in vial transport medium  Result Value Ref Range Status   SARS Coronavirus 2 by RT PCR POSITIVE (A) NEGATIVE Final    Comment: RESULT CALLED TO, READ BACK BY AND VERIFIED WITH: RN EMMANUEL CASTRO BY MESSA H. AT 0110 ON 6 26 2022 (NOTE) SARS-CoV-2 target nucleic acids are DETECTED.  The SARS-CoV-2 RNA is generally detectable in upper respiratory specimens during the acute phase of infection. Positive results are indicative of the presence of the identified virus, but do not rule out bacterial infection or co-infection with other pathogens not detected by the test. Clinical correlation with patient history and other diagnostic information is necessary to determine patient infection status. The expected result is Negative.  Fact Sheet for Patients: BloggerCourse.com  Fact Sheet for Healthcare Providers: SeriousBroker.it  This test is not yet approved or cleared by the [place] FDA and  has been authorized for detection and/or diagnosis of SARS-CoV-2 by FDA under an Emergency Use Authorization (EUA).  This EUA will remain in effect (meanin g this test can be used) for the duration of  the COVID-19 declaration under Section 564(b)(1) of the Act, 21 U.S.C. section 360bbb-3(b)(1), unless the authorization is terminated or revoked sooner.     Influenza A by PCR NEGATIVE NEGATIVE Final   Influenza B by PCR NEGATIVE NEGATIVE Final    Comment: (NOTE) The Xpert Xpress SARS-CoV-2/FLU/RSV plus assay is intended as an aid in the diagnosis of influenza from Nasopharyngeal swab specimens and should not be used as a sole basis for  treatment. Nasal washings and aspirates are unacceptable for Xpert Xpress SARS-CoV-2/FLU/RSV testing.  Fact Sheet for Patients: BloggerCourse.com  Fact Sheet for Healthcare Providers: SeriousBroker.it  This test is not yet approved or cleared by the [place] FDA and has been authorized for detection and/or diagnosis of SARS-CoV-2 by FDA under an Emergency Use Authorization (EUA). This EUA will remain in effect (meaning this test can be used) for the duration of the COVID-19 declaration under Section 564(b)(1) of the Act, 21 U.S.C. section 360bbb-3(b)(1), unless the authorization is terminated or revoked.  Performed at [facility], [address] [place], [place] [address]   Culture, blood (routine x 2)     Status: None   Collection Time: 06/16/21  2:05 AM   Specimen: BLOOD LEFT HAND  Result Value Ref Range Status   Specimen Description BLOOD LEFT HAND  Final   Special Requests   Final    BOTTLES DRAWN AEROBIC ONLY Blood Culture adequate volume   Culture   Final    NO GROWTH 5 DAYS Performed at [facility], [address], [place], [place] [address]    Report Status 06/21/2021 FINAL  Final  Culture, blood (routine x 2)     Status: None   Collection Time: 06/16/21  2:26 AM   Specimen: BLOOD RIGHT HAND  Result Value Ref Range Status   Specimen Description BLOOD RIGHT HAND  Final   Special Requests  Final    BOTTLES DRAWN AEROBIC ONLY Blood Culture adequate volume   Culture   Final    NO GROWTH 5 DAYS Performed at [facility], [address], [place], [place] [address]    Report Status 06/21/2021 FINAL  Final  Culture, Urine     Status: Abnormal   Collection Time: 06/17/21  3:11 PM   Specimen: Urine, Random  Result Value Ref Range Status   Specimen Description URINE, RANDOM  Final   Special Requests   Final    NONE Performed at [facility], [address], [place], [place] [address]     Culture MULTIPLE SPECIES PRESENT, SUGGEST RECOLLECTION (A)  Final   Report Status 06/18/2021 FINAL  Final    Radiology Studies: CT KNEE RIGHT W CONTRAST  Result Date: 06/22/2021 CLINICAL DATA:  Soft tissue infection.  Active COVID and MRSA. EXAM: CT OF THE 06/21/2021 radiograph KNEE WITH CONTRAST TECHNIQUE: Multidetector CT imaging was performed following the standard protocol during bolus administration of intravenous contrast. CONTRAST:  OMNIPAQUE IOHEXOL 300 MG/ML  SOLN COMPARISON:  Lower extremity CT angiogram from 03/02/2020 FINDINGS: Bones/Joint/Cartilage Marginal spurring along the patella along with a sclerotic bipartite portion. Small knee joint effusion without substantial appreciable synovitis. No acute bony findings. Ligaments Suboptimally assessed by CT. Muscles and Tendons Unremarkable Soft tissues The patient as a femoral to popliteal bypass in addition to an approximately 10 cm stent in the distal portion of the bypass extending to the anastomosis. Surrounding the distal component of the bypass graft and the stent, there is a 20.5 by 8.6 by 4.8 cm (volume = 440 cm^3) complex fluid collection. Proximally this fluid collection is lower in density, but in the vicinity of the stent there is surrounding high density components characteristic of hematoma/blood products. This collection has a caudad extension along the anteromedial tibia, and bulges outward stores the medial cutaneous surface in a manner somewhat similar in appearance and location to the prior abscess shown on 03/02/2020, and accordingly superinfection of this hematoma is not readily excluded. There is no observed gas within the collection at this time. Patency of the graft and stent are not assessed on today's noncontrast CT. Atherosclerotic calcification of the native popliteal artery is observed. IMPRESSION: 1. Approximately 440 cubic cm complex fluid collection surrounds the distal portion of the femoral-popliteal bypass  graft and the stent along the distal anastomosis in the popliteal space. This has high density components especially around the stent compatible with blood products. This also seems to occupy some the space occupied by the previously drained abscess that was shown on the 03/02/2020 exam; superinfection of this hematoma is not excluded. 2. Small knee joint effusion without appreciable synovitis. Electronically Signed   By: Gaylyn Rong M.D.   On: 06/22/2021 10:23     Zadaya Cuadra T. Shinika Estelle [facility]  If 7PM-7AM, please contact night-coverage [email or web address] 06/22/2021, 4:14 PM

## 2021-06-22 NOTE — Progress Notes (Signed)
This RN was alerted by NT that during rounds they found patient siting in blood. Upon assessment, it was found that patient pulled out foley catheter with balloon still inflated.   Patient was not aware that he had pulled out catheter and it was also found that patient unwrapped dressing on foot as well. Cleaned patient and emptied foley.  MD paged. Will continue to monitor.    Jean Rosenthal, RN

## 2021-06-22 NOTE — Plan of Care (Signed)
  Problem: Health Behavior/Discharge Planning: Goal: Ability to manage health-related needs will improve Outcome: Progressing   Problem: Clinical Measurements: Goal: Ability to maintain clinical measurements within normal limits will improve Outcome: Progressing   Problem: Nutrition: Goal: Adequate nutrition will be maintained Outcome: Progressing   

## 2021-06-23 DIAGNOSIS — R339 Retention of urine, unspecified: Secondary | ICD-10-CM

## 2021-06-23 DIAGNOSIS — U071 COVID-19: Secondary | ICD-10-CM | POA: Diagnosis not present

## 2021-06-23 LAB — CBC WITH DIFFERENTIAL/PLATELET
Abs Immature Granulocytes: 0.29 10*3/uL — ABNORMAL HIGH (ref 0.00–0.07)
Basophils Absolute: 0.1 10*3/uL (ref 0.0–0.1)
Basophils Relative: 0 %
Eosinophils Absolute: 0.1 10*3/uL (ref 0.0–0.5)
Eosinophils Relative: 1 %
HCT: 25 % — ABNORMAL LOW (ref 39.0–52.0)
Hemoglobin: 7.7 g/dL — ABNORMAL LOW (ref 13.0–17.0)
Immature Granulocytes: 1 %
Lymphocytes Relative: 10 %
Lymphs Abs: 2.1 10*3/uL (ref 0.7–4.0)
MCH: 21.7 pg — ABNORMAL LOW (ref 26.0–34.0)
MCHC: 30.8 g/dL (ref 30.0–36.0)
MCV: 70.4 fL — ABNORMAL LOW (ref 80.0–100.0)
Monocytes Absolute: 1.6 10*3/uL — ABNORMAL HIGH (ref 0.1–1.0)
Monocytes Relative: 7 %
Neutro Abs: 16.8 10*3/uL — ABNORMAL HIGH (ref 1.7–7.7)
Neutrophils Relative %: 81 %
Platelets: 873 10*3/uL — ABNORMAL HIGH (ref 150–400)
RBC: 3.55 MIL/uL — ABNORMAL LOW (ref 4.22–5.81)
RDW: 19.9 % — ABNORMAL HIGH (ref 11.5–15.5)
WBC: 20.9 10*3/uL — ABNORMAL HIGH (ref 4.0–10.5)
nRBC: 0 % (ref 0.0–0.2)

## 2021-06-23 NOTE — Progress Notes (Signed)
PROGRESS NOTE  Lonnie [name] JME:[id number] DOB: 09-07-1951   PCP: [name], MD  Patient is from: Home  DOA: 06/15/2021 LOS: 7  Chief complaints:  Confusion, generalized weakness and urinary retention  Brief Narrative / Interim history: 70 year old M with PMH of dementia, PAD s/p mechanical thrombectomy and stenting of occluded femoral popliteal bypass on 6/18, ongoing tobacco use, HTN, HLD, BPH, GERD, anxiety and depression returning with confusion, generalized weakness and urinary retention, and admitted for encephalopathy, AKI, UTI and also tested positive for COVID-19.  AKI resolved.  Briefly treated for UTI and COVID-19.  Therapy recommended SNF.  Vascular surgery and ID gave recommendations and signed off. He failed voiding trial.  He will be discharged with indwelling Foley for bladder rest.  Needs outpatient urology follow-up. He is still have leukocytosis and thrombocytosis.  Per ID, this is likely from his recent Decadron.  Therapy recommended SNF.  Of note, he refused SNF last hospitalization but seems to be in agreement now.  Subjective: Pulled out foley last night now refusing replacement foley but will allow for I/O caths PRN C/o right foot pain  Objective: Vitals:   06/22/21 0945 06/22/21 2015 06/23/21 0345 06/23/21 0949  BP: (!) 143/96 124/66 131/72 125/61  Pulse: 99 92 86 97  Resp: 20 18 19 17   Temp: 98.4 F (36.9 C) 98.1 F (36.7 C) 99.5 F (37.5 C) 99.1 F (37.3 C)  TempSrc: Oral Oral Oral Oral  SpO2: 98% 98% 99% 100%  Weight:  79.1 kg    Height:        Intake/Output Summary (Last 24 hours) at 06/23/2021 1301 Last data filed at 06/23/2021 0600 Gross per 24 hour  Intake 240 ml  Output 680 ml  Net -440 ml   Filed Weights   06/16/21 0300 06/22/21 2015  Weight: 84.9 kg 79.1 kg    Examination:   General: Appearance:    elderlymale in no acute distress     Lungs:     respirations unlabored  Heart:    Normal heart rate. Normal rhythm. No  murmurs, rubs, or gallops.    MS:   All extremities are intact. Right foot wrapped, toes cool to touch   Neurologic:   Awake, alert      Procedures:  None  Microbiology summarized: COVID-19 PCR positive. MRSA PCR screen positive. Blood cultures NGTD. Urine culture with multiple species.  Assessment & Plan: AKI/azotemia: Likely mix of prerenal and postobstructive. AKI resolved.  Acute urinary retention with bladder over distention: Initially passed voiding trial but had 1.2 L UOP with cath on 6/29. -pulled out foley and is refusing to have it replaced -Continue Flomax and Urecholine  Right foot ulcer/wound-low suspicion for foot infection by all experts PAD s/p recent mechanical thrombectomy and stent of occluded femoropopliteal bypass Right lower extremity pain-likely due to osteoarthritis and hematoma.  -Appreciate recommendation by infectious disease-discontinued antibiotics. -CT of right knee suggestive for hematoma but cannot exclude infection. -7/2-discussed with ID, Dr. 7/29 who thinks his leukocytosis and thrombocytosis is likely from Decadron -Continue Tylenol, tramadol and oxycodone as needed for pain based on severity  Acute metabolic encephalopathy/delirium-could be from UTI, COVID-19 infection, polypharmacy, delirium in the setting of dementia.  Somewhat angry this morning.  Could be due to pain -Treat treatable causes -Reorientation and delirium precautions -May have to cut down on his Seroquel, gabapentin and Klonopin if worse. -Manage pain as above   Acute COVID-19 pneumonia: symptomatic for 1 day. Tested positive on 6/25.  Desaturated  to 93% on RA.  CXR with increased patchy opacities RIGHT lung base. Cardiomegaly and mild diffuse pulmonary interstitial congestion. CRP 29. Now saturating at 98% on RA.  -Completed 3 days of remdesivir and 4 days of Decadron on 6/29 -Subcu Lovenox for VTE prophylaxis. -Supportive care  Elevated D-dimer: Likely due to COVID  infection.  Low suspicion for PE.  LE [place] negative for DVT.  Presumptive UTI: UA with moderate pyuria and bacteriuria.  Urine culture with multiple species.  -Completed short course of ceftriaxone and IV cefepime on 6/29. -ID signed off.  Elevated liver enzymes: CK within normal.  RUQ [place] suggestive for hepatic steatosis otherwise negative.  HIV and acute hepatitis panel negative.  Improving off remdesivir and statin. -Continue trending   ICM/chronic combined CHF: TTE in 2018 with LVEF of 40 to 45%, G1-DD.  Appears to have significant edema in RLE likely from recent revascularization.  However, CXR with some interstitial venous congestion.  BNP 365 but no prior to compare to.  Net -9 L so far. Renal function stable. -Continue home Lasix. -Monitor fluid and respiratory status  History of CAD s/p CABG: Stable. -Continue Lopressor, aspirin, Plavix   Chronic anxiety/depression: Stable -Continue home meds   Essential hypertension: Normotensive. -Cardiac meds as above   Hyperlipidemia -Holding Lipitor due to elevated liver enzymes   Iron deficiency anemia/ anemia of chronic disease: H&H relatively stable. -H&H stable. -Goal Hgb >8.0 given history of CAD   Generalized weakness: Ambulates using walker at baseline. -PT/OT-recommended SNF   Tobacco use disorder: smokes 2 to 3 cigarettes/day -Counseled family. -Nicotine patch  Leukocytosis/bandemia-demargination in the setting of steroid versus infectious process.  Improving.  Acute thrombocytosis: Felt to be reactive in the setting of Decadron.  He is now off Decadron.  -7/2-discussed with ID, Dr. Ninetta Lights who thinks his leukocytosis and thrombocytosis is likely from Decadron  Constipation: seems to have resolved. -Senokot-S twice daily  -MiraLAX twice daily as needed  GERD -Continue PPI       DVT prophylaxis:  enoxaparin (LOVENOX) injection 40 mg Start: 06/15/21 2200 SCDs Start: 06/15/21 2150  Code Status: DNR/DNI Family  Communication: Patient and/or RN.   Level of care: Telemetry Medical Status is: Inpatient  Remains inpatient appropriate because:Unsafe d/c plan  Dispo:  Patient From: Home  Planned Disposition: Skilled Nursing Facility  Medically stable for discharge: Yes       Consultants:  Vascular surgery Infectious disease   Sch Meds:  Scheduled Meds:  aspirin EC  81 mg Oral Daily   bethanechol  10 mg Oral TID   Chlorhexidine Gluconate Cloth  6 each Topical Daily   Chlorhexidine Gluconate Cloth  6 each Topical Q0600   citalopram  20 mg Oral Daily   clopidogrel  75 mg Oral Daily   enoxaparin (LOVENOX) injection  40 mg Subcutaneous Q24H   ferrous fumarate-b12-vitamic C-folic acid  1 capsule Oral Daily   furosemide  20 mg Oral Daily   gabapentin  100 mg Oral BID   irbesartan  75 mg Oral Daily   melatonin  10 mg Oral QHS   metoprolol tartrate  12.5 mg Oral BID   multivitamin with minerals  1 tablet Oral Daily   mupirocin ointment  1 application Nasal BID   nicotine  7 mg Transdermal Daily   pantoprazole  40 mg Oral QPM   QUEtiapine  200 mg Oral BID   senna-docusate  1 tablet Oral BID   sodium chloride flush  3 mL Intravenous Once  tamsulosin  0.8 mg Oral QPM   Continuous Infusions:   PRN Meds:.acetaminophen, albuterol, clonazePAM, guaiFENesin-dextromethorphan, oxyCODONE, polyethylene glycol, prochlorperazine, traMADol  Antimicrobials: Anti-infectives (From admission, onward)    Start     Dose/Rate Route Frequency Ordered Stop   06/17/21 1400  ceFEPIme (MAXIPIME) 2 g in sodium chloride 0.9 % 100 mL IVPB        2 g 200 mL/hr over 30 Minutes Intravenous Every 12 hours 06/17/21 1258 06/19/21 0955   06/17/21 1000  remdesivir 100 mg in sodium chloride 0.9 % 100 mL IVPB       See Hyperspace for full Linked Orders Report.   100 mg 200 mL/hr over 30 Minutes Intravenous Daily 06/16/21 0135 06/18/21 1026   06/16/21 2200  cefTRIAXone (ROCEPHIN) 2 g in sodium chloride 0.9 % 100 mL IVPB   Status:  Discontinued        2 g 200 mL/hr over 30 Minutes Intravenous Every 24 hours 06/15/21 2304 06/17/21 1218   06/16/21 2200  vancomycin (VANCOREADY) IVPB 1000 mg/200 mL  Status:  Discontinued        1,000 mg 200 mL/hr over 60 Minutes Intravenous Every 12 hours 06/16/21 0820 06/17/21 1218   06/16/21 0915  vancomycin (VANCOREADY) IVPB 1750 mg/350 mL        1,750 mg 175 mL/hr over 120 Minutes Intravenous  Once 06/16/21 0820 06/16/21 1114   06/16/21 0230  remdesivir 200 mg in sodium chloride 0.9% 250 mL IVPB       See Hyperspace for full Linked Orders Report.   200 mg 580 mL/hr over 30 Minutes Intravenous Once 06/16/21 0135 06/16/21 0324   06/15/21 2230  linezolid (ZYVOX) IVPB 600 mg  Status:  Discontinued        600 mg 300 mL/hr over 60 Minutes Intravenous Every 12 hours 06/15/21 2228 06/16/21 0811   06/15/21 2115  cefTRIAXone (ROCEPHIN) 2 g in sodium chloride 0.9 % 100 mL IVPB        2 g 200 mL/hr over 30 Minutes Intravenous  Once 06/15/21 2114 06/15/21 2328        I have personally reviewed the following labs and images: CBC: Recent Labs  Lab 06/19/21 1357 06/20/21 0336 06/21/21 0603 06/22/21 0831 06/23/21 0639  WBC 35.7* 25.4* 20.1* 21.6* 20.9*  NEUTROABS 32.4* 20.9* 16.6* 17.4* 16.8*  HGB 9.3* 8.7* 8.3* 8.3* 7.7*  HCT 30.3* 27.6* 27.5* 27.9* 25.0*  MCV 71.6* 70.2* 71.8* 73.0* 70.4*  PLT 870* 867* 919* 950* 873*   BMP &GFR Recent Labs  Lab 06/18/21 1204 06/19/21 1357 06/20/21 0336 06/21/21 0603 06/22/21 0445 06/22/21 0831  NA 134* 131* 132* 133*  --  135  K 4.3 4.5 4.3 4.3  --  4.8  CL 103 98 102 101  --  101  CO2 21* 21* 22 22  --  22  GLUCOSE 125* 147* 118* 141*  --  115*  BUN --  20  CREATININE 0.74 0.86 0.88 1.00 0.87 0.82  CALCIUM 8.8* 9.2 9.0 9.1  --  9.3  MG 2.0 2.0 2.1 2.2  --  2.5*  PHOS 2.8 3.5 3.8 3.2  --  3.3   Estimated Creatinine Clearance: 96.5 mL/min (by C-G formula based on SCr of 0.82 mg/dL). Liver & Pancreas: Recent  Labs  Lab 06/17/21 0015 06/18/21 1204 06/19/21 1357 06/20/21 0336 06/21/21 0603 06/22/21 0831  AST 152* 215* 114* 69* 43*  --   ALT 146* 264* 223* 173* 131*  --  ALKPHOS 126 122 135* 125 117  --   BILITOT 0.4 0.4 0.6 0.6 0.9  --   PROT 6.3* 7.2 7.8 7.2 7.4  --   ALBUMIN 2.2* 2.6* 3.0* 2.7* 2.7* 2.8*   No results for input(s): LIPASE, AMYLASE in the last 168 hours. No results for input(s): AMMONIA in the last 168 hours. Diabetic: No results for input(s): HGBA1C in the last 72 hours. No results for input(s): GLUCAP in the last 168 hours. Cardiac Enzymes: Recent Labs  Lab 06/18/21 1204  CKTOTAL 38*   No results for input(s): PROBNP in the last 8760 hours. Coagulation Profile: No results for input(s): INR, PROTIME in the last 168 hours. Thyroid Function Tests: No results for input(s): TSH, T4TOTAL, FREET4, T3FREE, THYROIDAB in the last 72 hours. Lipid Profile: No results for input(s): CHOL, HDL, LDLCALC, TRIG, CHOLHDL, LDLDIRECT in the last 72 hours. Anemia Panel: No results for input(s): VITAMINB12, FOLATE, FERRITIN, TIBC, IRON, RETICCTPCT in the last 72 hours.  Urine analysis:    Component Value Date/Time   COLORURINE YELLOW 06/15/2021 2218   APPEARANCEUR TURBID (A) 06/15/2021 2218   LABSPEC 1.014 06/15/2021 2218   PHURINE 5.0 06/15/2021 2218   GLUCOSEU NEGATIVE 06/15/2021 2218   GLUCOSEU NEGATIVE 04/12/2021 1323   HGBUR SMALL (A) 06/15/2021 2218   BILIRUBINUR NEGATIVE 06/15/2021 2218   KETONESUR 5 (A) 06/15/2021 2218   PROTEINUR 100 (A) 06/15/2021 2218   UROBILINOGEN 0.2 04/12/2021 1323   NITRITE NEGATIVE 06/15/2021 2218   LEUKOCYTESUR MODERATE (A) 06/15/2021 2218   Sepsis Labs: Invalid input(s): PROCALCITONIN, LACTICIDVEN  Microbiology: Recent Results (from the past 240 hour(s))  MRSA Next Gen by PCR, Nasal     Status: Abnormal   Collection Time: 06/15/21 10:21 PM   Specimen: Nasal Mucosa; Nasal Swab  Result Value Ref Range Status   MRSA by PCR Next Gen  DETECTED (A) NOT DETECTED Final    Comment: RESULT CALLED TO, READ BACK BY AND VERIFIED WITH: E,BLUE RN @1153  06/16/21 EB (NOTE) The GeneXpert MRSA Assay (FDA approved for NASAL specimens only), is one component of a comprehensive MRSA colonization surveillance program. It is not intended to diagnose MRSA infection nor to guide or monitor treatment for MRSA infections. Test performance is not FDA approved in patients less than 68 years old. Performed at [facility], [address] [place], [place] [address]   Resp Panel by RT-PCR (Flu A&B, Covid) Nasopharyngeal Swab     Status: Abnormal   Collection Time: 06/15/21 10:36 PM   Specimen: Nasopharyngeal Swab; Nasopharyngeal(NP) swabs in vial transport medium  Result Value Ref Range Status   SARS Coronavirus 2 by RT PCR POSITIVE (A) NEGATIVE Final    Comment: RESULT CALLED TO, READ BACK BY AND VERIFIED WITH: RN EMMANUEL CASTRO BY MESSA H. AT 0110 ON 6 26 2022 (NOTE) SARS-CoV-2 target nucleic acids are DETECTED.  The SARS-CoV-2 RNA is generally detectable in upper respiratory specimens during the acute phase of infection. Positive results are indicative of the presence of the identified virus, but do not rule out bacterial infection or co-infection with other pathogens not detected by the test. Clinical correlation with patient history and other diagnostic information is necessary to determine patient infection status. The expected result is Negative.  Fact Sheet for Patients: BloggerCourse.comhttps://www.fda.gov/media/152166/download  Fact Sheet for Healthcare Providers: SeriousBroker.ithttps://www.fda.gov/media/152162/download  This test is not yet approved or cleared by the [place] FDA and  has been authorized for detection and/or diagnosis of SARS-CoV-2 by FDA under an Emergency Use  Authorization (EUA).  This EUA will remain in effect (meanin g this test can be used) for the duration of  the COVID-19 declaration under Section 564(b)(1) of the Act,  21 U.S.C. section 360bbb-3(b)(1), unless the authorization is terminated or revoked sooner.     Influenza A by PCR NEGATIVE NEGATIVE Final   Influenza B by PCR NEGATIVE NEGATIVE Final    Comment: (NOTE) The Xpert Xpress SARS-CoV-2/FLU/RSV plus assay is intended as an aid in the diagnosis of influenza from Nasopharyngeal swab specimens and should not be used as a sole basis for treatment. Nasal washings and aspirates are unacceptable for Xpert Xpress SARS-CoV-2/FLU/RSV testing.  Fact Sheet for Patients: BloggerCourse.com  Fact Sheet for Healthcare Providers: SeriousBroker.it  This test is not yet approved or cleared by the [place] FDA and has been authorized for detection and/or diagnosis of SARS-CoV-2 by FDA under an Emergency Use Authorization (EUA). This EUA will remain in effect (meaning this test can be used) for the duration of the COVID-19 declaration under Section 564(b)(1) of the Act, 21 U.S.C. section 360bbb-3(b)(1), unless the authorization is terminated or revoked.  Performed at [facility], [address], [place], [place] [address]   Culture, blood (routine x 2)     Status: None   Collection Time: 06/16/21  2:05 AM   Specimen: BLOOD LEFT HAND  Result Value Ref Range Status   Specimen Description BLOOD LEFT HAND  Final   Special Requests   Final    BOTTLES DRAWN AEROBIC ONLY Blood Culture adequate volume   Culture   Final    NO GROWTH 5 DAYS Performed at [facility], [address] [place], [place] [address]    Report Status 06/21/2021 FINAL  Final  Culture, blood (routine x 2)     Status: None   Collection Time: 06/16/21  2:26 AM   Specimen: BLOOD RIGHT HAND  Result Value Ref Range Status   Specimen Description BLOOD RIGHT HAND  Final   Special Requests   Final    BOTTLES DRAWN AEROBIC ONLY Blood Culture adequate volume   Culture   Final    NO GROWTH 5 DAYS Performed at [facility], [address] [place], [place] [address]    Report Status 06/21/2021 FINAL  Final  Culture, Urine     Status: Abnormal   Collection Time: 06/17/21  3:11 PM   Specimen: Urine, Random  Result Value Ref Range Status   Specimen Description URINE, RANDOM  Final   Special Requests   Final    NONE Performed at [facility], [address] [place], [place] [address]    Culture MULTIPLE SPECIES PRESENT, SUGGEST RECOLLECTION (A)  Final   Report Status 06/18/2021 FINAL  Final    Radiology Studies: No results found.   Marlin Canary DO [facility]  If 7PM-7AM, please contact night-coverage [email or web address] 06/23/2021, 1:01 PM

## 2021-06-23 NOTE — TOC Progression Note (Signed)
Transition of Care [facility]) - Progression Note    Patient Details  Name: SUEO CULLEN MRN: [id number] Date of Birth: 1951-08-17  Transition of Care [facility]) CM/SW Contact  Carley Hammed, [place] Phone Number: 06/23/2021, 2:05 PM  Clinical Narrative:    CSW spoke with pt's spouse who noted she had not chosen a facility yet. CSW answered follow up questions and spouse stated it is necessary for him to have a private room. She will accept the facility that can offer that. CSW unable to get in contact with the facilities to determine room availability. SW will continue to follow for DC needs.   Barriers to Discharge: Continued Medical Work up  Expected Discharge Plan and Services                                                 Social Determinants of Health (SDOH) Interventions    Readmission Risk Interventions Readmission Risk Prevention Plan 06/14/2021 09/12/2020 03/12/2020  Post Dischage Appt Complete - -  Medication Screening Complete - -  Transportation Screening Complete Complete -  PCP or Specialist Appt within 3-5 Days - Complete -  HRI or Home Care Consult - Complete -  Social Work Consult for Recovery Care Planning/Counseling - Complete -  Palliative Care Screening - Not Applicable -  Medication Review Oceanographer) - Complete Complete  Some recent data might be hidden

## 2021-06-24 DIAGNOSIS — R748 Abnormal levels of other serum enzymes: Secondary | ICD-10-CM | POA: Diagnosis not present

## 2021-06-24 DIAGNOSIS — R4182 Altered mental status, unspecified: Secondary | ICD-10-CM

## 2021-06-24 DIAGNOSIS — R339 Retention of urine, unspecified: Secondary | ICD-10-CM | POA: Diagnosis not present

## 2021-06-24 LAB — CBC
HCT: 25.2 % — ABNORMAL LOW (ref 39.0–52.0)
Hemoglobin: 7.6 g/dL — ABNORMAL LOW (ref 13.0–17.0)
MCH: 21.2 pg — ABNORMAL LOW (ref 26.0–34.0)
MCHC: 30.2 g/dL (ref 30.0–36.0)
MCV: 70.4 fL — ABNORMAL LOW (ref 80.0–100.0)
Platelets: 862 10*3/uL — ABNORMAL HIGH (ref 150–400)
RBC: 3.58 MIL/uL — ABNORMAL LOW (ref 4.22–5.81)
RDW: 19.8 % — ABNORMAL HIGH (ref 11.5–15.5)
WBC: 21.9 10*3/uL — ABNORMAL HIGH (ref 4.0–10.5)
nRBC: 0 % (ref 0.0–0.2)

## 2021-06-24 LAB — BASIC METABOLIC PANEL
Anion gap: 9 (ref 5–15)
BUN: 17 mg/dL (ref 8–23)
CO2: 22 mmol/L (ref 22–32)
Calcium: 8.6 mg/dL — ABNORMAL LOW (ref 8.9–10.3)
Chloride: 97 mmol/L — ABNORMAL LOW (ref 98–111)
Creatinine, Ser: 0.78 mg/dL (ref 0.61–1.24)
GFR, Estimated: >60 mL/min (ref >60)
Glucose, Bld: 138 mg/dL — ABNORMAL HIGH (ref 70–99)
Potassium: 4.2 mmol/L (ref 3.5–5.1)
Sodium: 128 mmol/L — ABNORMAL LOW (ref 135–145)

## 2021-06-24 MED ORDER — QUETIAPINE FUMARATE 100 MG PO TABS
100.0000 mg | ORAL_TABLET | Freq: Two times a day (BID) | ORAL | Status: DC
  Administered 2021-06-24 – 2021-07-12 (×37): 100 mg via ORAL
  Filled 2021-06-24 (×6): qty 1
  Filled 2021-06-24: qty 2
  Filled 2021-06-24 (×10): qty 1
  Filled 2021-06-24: qty 2
  Filled 2021-06-24 (×5): qty 1
  Filled 2021-06-24: qty 2
  Filled 2021-06-24 (×13): qty 1

## 2021-06-24 NOTE — Progress Notes (Signed)
PROGRESS NOTE  Lonnie [name] BUL:[id number] DOB: 01-01-1951   PCP: [name], MD  Patient is from: Home  DOA: 06/15/2021 LOS: 8  Chief complaints:  Confusion, generalized weakness and urinary retention  Brief Narrative / Interim history: 70 year old M with PMH of dementia, PAD s/p mechanical thrombectomy and stenting of occluded femoral popliteal bypass on 6/18, ongoing tobacco use, HTN, HLD, BPH, GERD, anxiety and depression returning with confusion, generalized weakness and urinary retention, and admitted for encephalopathy, AKI, UTI and also tested positive for COVID-19.  AKI resolved.  Briefly treated for UTI and COVID-19.  Therapy recommended SNF.  Vascular surgery and ID gave recommendations and signed off. He failed voiding trial.  He will be discharged with indwelling Foley for bladder rest.  Needs outpatient urology follow-up. He is still have leukocytosis and thrombocytosis.  Per ID, this is likely from his recent Decadron.  Therapy recommended SNF.  Of note, he refused SNF last hospitalization but seems to be in agreement now.  Subjective: Agreeable for foley today  Objective: Vitals:   06/23/21 1500 06/23/21 1802 06/23/21 2033 06/24/21 0534  BP: 128/71 125/68 134/69 117/83  Pulse: 94 90 91 96  Resp: 18 16 20 20   Temp: 98.8 F (37.1 C) 98.6 F (37 C) 98.8 F (37.1 C) 98.3 F (36.8 C)  TempSrc: Oral Oral Oral Oral  SpO2: 100% 100% 97% 96%  Weight:      Height:        Intake/Output Summary (Last 24 hours) at 06/24/2021 1300 Last data filed at 06/24/2021 1135 Gross per 24 hour  Intake 600 ml  Output 1900 ml  Net -1300 ml   Filed Weights   06/16/21 0300 06/22/21 2015  Weight: 84.9 kg 79.1 kg    Examination:   General: Appearance:    elderlymale in no acute distress     Lungs:     respirations unlabored  Heart:    Normal heart rate.    MS:   All extremities are intact. Right foot wrapped, toes cool to touch   Neurologic:   Awake, alert, irritable  oriented to place/person/time         Microbiology summarized: COVID-19 PCR positive. MRSA PCR screen positive. Blood cultures NGTD. Urine culture with multiple species.  Assessment & Plan: AKI/azotemia: Likely mix of prerenal and postobstructive. AKI resolved.  Acute urinary retention with bladder over distention: Initially passed voiding trial but had 1.2 L UOP with cath on 6/29. -pulled out foley -- replace -Continue Flomax and Urecholine  Right foot ulcer/wound-low suspicion for foot infection by all experts PAD s/p recent mechanical thrombectomy and stent of occluded femoropopliteal bypass Right lower extremity pain-likely due to osteoarthritis and hematoma.  -Appreciate recommendation by infectious disease-discontinued antibiotics. -CT of right knee suggestive for hematoma but cannot exclude infection. -7/2-discussed with ID, Dr. 7/29 who thinks his leukocytosis and thrombocytosis is likely from Decadron -Continue Tylenol, tramadol and oxycodone as needed for pain based on severity  Acute metabolic encephalopathy/delirium-could be from UTI, COVID-19 infection, polypharmacy, delirium in the setting of dementia.  Could be due to pain -Treat treatable causes -Reorientation and delirium precautions -Manage pain as above   Acute COVID-19 pneumonia: tested positive on 6/25.  Desaturated to 93% on RA.  CXR with increased patchy opacities RIGHT lung base. Cardiomegaly and mild diffuse pulmonary interstitial congestion. CRP 29. Now saturating at 98% on RA.  -Completed 3 days of remdesivir and 4 days of Decadron on 6/29 -Subcu Lovenox for VTE prophylaxis. -Supportive care  Elevated D-dimer: Likely due to COVID infection.  Low suspicion for PE.  LE [place] negative for DVT.  Presumptive UTI: UA with moderate pyuria and bacteriuria.  Urine culture with multiple species.  -Completed short course of ceftriaxone and IV cefepime on 6/29. -ID signed off.  Elevated liver enzymes: CK  within normal.  RUQ [place] suggestive for hepatic steatosis otherwise negative.  HIV and acute hepatitis panel negative.  Improving off remdesivir and statin. -Continue trending   ICM/chronic combined CHF: TTE in 2018 with LVEF of 40 to 45%, G1-DD.  Appears to have significant edema in RLE likely from recent revascularization.  However, CXR with some interstitial venous congestion.  BNP 365 but no prior to compare to.  Net -9 L so far. Renal function stable. -Continue home Lasix. -Monitor fluid and respiratory status  History of CAD s/p CABG: Stable. -Continue Lopressor, aspirin, Plavix   Chronic anxiety/depression: Stable -Continue home meds   Essential hypertension: Normotensive. -Cardiac meds as above   Hyperlipidemia -Holding Lipitor due to elevated liver enzymes   Iron deficiency anemia/ anemia of chronic disease: H&H relatively stable. -H&H stable. -Goal Hgb >8.0 given history of CAD   Generalized weakness: Ambulates using walker at baseline. -PT/OT-recommended SNF   Tobacco use disorder: smokes 2 to 3 cigarettes/day -Counseled family. -Nicotine patch  Leukocytosis/bandemia-demargination in the setting of steroid versus infectious process.  Improving.  Acute thrombocytosis: Felt to be reactive in the setting of Decadron.  He is now off Decadron.  -7/2-discussed with ID, Dr. Ninetta Lights who thinks his leukocytosis and thrombocytosis is likely from Decadron  Constipation: seems to have resolved. -Senokot-S twice daily  -MiraLAX twice daily as needed  GERD -Continue PPI       DVT prophylaxis:  enoxaparin (LOVENOX) injection 40 mg Start: 06/15/21 2200 SCDs Start: 06/15/21 2150  Code Status: DNR/DNI Family Communication: Patient and/or RN.   Level of care: Telemetry Medical Status is: Inpatient  Remains inpatient appropriate because:Unsafe d/c plan  Dispo:  Patient From: Home  Planned Disposition: Skilled Nursing Facility  Medically stable for discharge: Yes        Consultants:  Vascular surgery Infectious disease   Sch Meds:  Scheduled Meds:  aspirin EC  81 mg Oral Daily   bethanechol  10 mg Oral TID   Chlorhexidine Gluconate Cloth  6 each Topical Daily   Chlorhexidine Gluconate Cloth  6 each Topical Q0600   citalopram  20 mg Oral Daily   clopidogrel  75 mg Oral Daily   enoxaparin (LOVENOX) injection  40 mg Subcutaneous Q24H   ferrous fumarate-b12-vitamic C-folic acid  1 capsule Oral Daily   furosemide  20 mg Oral Daily   gabapentin  100 mg Oral BID   irbesartan  75 mg Oral Daily   melatonin  10 mg Oral QHS   metoprolol tartrate  12.5 mg Oral BID   multivitamin with minerals  1 tablet Oral Daily   nicotine  7 mg Transdermal Daily   pantoprazole  40 mg Oral QPM   QUEtiapine  200 mg Oral BID   senna-docusate  1 tablet Oral BID   sodium chloride flush  3 mL Intravenous Once   tamsulosin  0.8 mg Oral QPM   Continuous Infusions:   PRN Meds:.acetaminophen, albuterol, clonazePAM, guaiFENesin-dextromethorphan, oxyCODONE, polyethylene glycol, prochlorperazine, traMADol  Antimicrobials: Anti-infectives (From admission, onward)    Start     Dose/Rate Route Frequency Ordered Stop   06/17/21 1400  ceFEPIme (MAXIPIME) 2 g in sodium chloride 0.9 % 100 mL IVPB  2 g 200 mL/hr over 30 Minutes Intravenous Every 12 hours 06/17/21 1258 06/19/21 0955   06/17/21 1000  remdesivir 100 mg in sodium chloride 0.9 % 100 mL IVPB       See Hyperspace for full Linked Orders Report.   100 mg 200 mL/hr over 30 Minutes Intravenous Daily 06/16/21 0135 06/18/21 1026   06/16/21 2200  cefTRIAXone (ROCEPHIN) 2 g in sodium chloride 0.9 % 100 mL IVPB  Status:  Discontinued        2 g 200 mL/hr over 30 Minutes Intravenous Every 24 hours 06/15/21 2304 06/17/21 1218   06/16/21 2200  vancomycin (VANCOREADY) IVPB 1000 mg/200 mL  Status:  Discontinued        1,000 mg 200 mL/hr over 60 Minutes Intravenous Every 12 hours 06/16/21 0820 06/17/21 1218   06/16/21  0915  vancomycin (VANCOREADY) IVPB 1750 mg/350 mL        1,750 mg 175 mL/hr over 120 Minutes Intravenous  Once 06/16/21 0820 06/16/21 1114   06/16/21 0230  remdesivir 200 mg in sodium chloride 0.9% 250 mL IVPB       See Hyperspace for full Linked Orders Report.   200 mg 580 mL/hr over 30 Minutes Intravenous Once 06/16/21 0135 06/16/21 0324   06/15/21 2230  linezolid (ZYVOX) IVPB 600 mg  Status:  Discontinued        600 mg 300 mL/hr over 60 Minutes Intravenous Every 12 hours 06/15/21 2228 06/16/21 0811   06/15/21 2115  cefTRIAXone (ROCEPHIN) 2 g in sodium chloride 0.9 % 100 mL IVPB        2 g 200 mL/hr over 30 Minutes Intravenous  Once 06/15/21 2114 06/15/21 2328        I have personally reviewed the following labs and images: CBC: Recent Labs  Lab 06/19/21 1357 06/20/21 0336 06/21/21 0603 06/22/21 0831 06/23/21 0639 06/24/21 0954  WBC 35.7* 25.4* 20.1* 21.6* 20.9* 21.9*  NEUTROABS 32.4* 20.9* 16.6* 17.4* 16.8*  --   HGB 9.3* 8.7* 8.3* 8.3* 7.7* 7.6*  HCT 30.3* 27.6* 27.5* 27.9* 25.0* 25.2*  MCV 71.6* 70.2* 71.8* 73.0* 70.4* 70.4*  PLT 870* 867* 919* 950* 873* 862*   BMP &GFR Recent Labs  Lab 06/18/21 1204 06/19/21 1357 06/20/21 0336 06/21/21 0603 06/22/21 0445 06/22/21 0831 06/24/21 0954  NA 134* 131* 132* 133*  --  135 128*  K 4.3 4.5 4.3 4.3  --  4.8 4.2  CL 103 98 102 101  --  101 97*  CO2 21* 21* 22 22  --  22 22  GLUCOSE 125* 147* 118* 141*  --  115* 138*  BUN 18 20 19 22   --  20 17  CREATININE 0.74 0.86 0.88 1.00 0.87 0.82 0.78  CALCIUM 8.8* 9.2 9.0 9.1  --  9.3 8.6*  MG 2.0 2.0 2.1 2.2  --  2.5*  --   PHOS 2.8 3.5 3.8 3.2  --  3.3  --    Estimated Creatinine Clearance: 98.9 mL/min (by C-G formula based on SCr of 0.78 mg/dL). Liver & Pancreas: Recent Labs  Lab 06/18/21 1204 06/19/21 1357 06/20/21 0336 06/21/21 0603 06/22/21 0831  AST 215* 114* 69* 43*  --   ALT 264* 223* 173* 131*  --   ALKPHOS 122 135* 125 117  --   BILITOT 0.4 0.6 0.6 0.9   --   PROT 7.2 7.8 7.2 7.4  --   ALBUMIN 2.6* 3.0* 2.7* 2.7* 2.8*   No results for input(s): LIPASE, AMYLASE  in the last 168 hours. No results for input(s): AMMONIA in the last 168 hours. Diabetic: No results for input(s): HGBA1C in the last 72 hours. No results for input(s): GLUCAP in the last 168 hours. Cardiac Enzymes: Recent Labs  Lab 06/18/21 1204  CKTOTAL 38*   No results for input(s): PROBNP in the last 8760 hours. Coagulation Profile: No results for input(s): INR, PROTIME in the last 168 hours. Thyroid Function Tests: No results for input(s): TSH, T4TOTAL, FREET4, T3FREE, THYROIDAB in the last 72 hours. Lipid Profile: No results for input(s): CHOL, HDL, LDLCALC, TRIG, CHOLHDL, LDLDIRECT in the last 72 hours. Anemia Panel: No results for input(s): VITAMINB12, FOLATE, FERRITIN, TIBC, IRON, RETICCTPCT in the last 72 hours.  Urine analysis:    Component Value Date/Time   COLORURINE YELLOW 06/15/2021 2218   APPEARANCEUR TURBID (A) 06/15/2021 2218   LABSPEC 1.014 06/15/2021 2218   PHURINE 5.0 06/15/2021 2218   GLUCOSEU NEGATIVE 06/15/2021 2218   GLUCOSEU NEGATIVE 04/12/2021 1323   HGBUR SMALL (A) 06/15/2021 2218   BILIRUBINUR NEGATIVE 06/15/2021 2218   KETONESUR 5 (A) 06/15/2021 2218   PROTEINUR 100 (A) 06/15/2021 2218   UROBILINOGEN 0.2 04/12/2021 1323   NITRITE NEGATIVE 06/15/2021 2218   LEUKOCYTESUR MODERATE (A) 06/15/2021 2218   Sepsis Labs: Invalid input(s): PROCALCITONIN, LACTICIDVEN  Microbiology: Recent Results (from the past 240 hour(s))  MRSA Next Gen by PCR, Nasal     Status: Abnormal   Collection Time: 06/15/21 10:21 PM   Specimen: Nasal Mucosa; Nasal Swab  Result Value Ref Range Status   MRSA by PCR Next Gen DETECTED (A) NOT DETECTED Final    Comment: RESULT CALLED TO, READ BACK BY AND VERIFIED WITH: E,BLUE RN  06/16/21 EB (NOTE) The GeneXpert MRSA Assay (FDA approved for NASAL specimens only), is one component of a comprehensive MRSA  colonization surveillance program. It is not intended to diagnose MRSA infection nor to guide or monitor treatment for MRSA infections. Test performance is not FDA approved in patients less than 2 years old. Performed at [facility], [address], [place], [place] [address]   Resp Panel by RT-PCR (Flu A&B, Covid) Nasopharyngeal Swab     Status: Abnormal   Collection Time: 06/15/21 10:36 PM   Specimen: Nasopharyngeal Swab; Nasopharyngeal(NP) swabs in vial transport medium  Result Value Ref Range Status   SARS Coronavirus 2 by RT PCR POSITIVE (A) NEGATIVE Final    Comment: RESULT CALLED TO, READ BACK BY AND VERIFIED WITH: RN EMMANUEL CASTRO BY MESSA H. AT 0110 ON 6 26 2022 (NOTE) SARS-CoV-2 target nucleic acids are DETECTED.  The SARS-CoV-2 RNA is generally detectable in upper respiratory specimens during the acute phase of infection. Positive results are indicative of the presence of the identified virus, but do not rule out bacterial infection or co-infection with other pathogens not detected by the test. Clinical correlation with patient history and other diagnostic information is necessary to determine patient infection status. The expected result is Negative.  Fact Sheet for Patients: BloggerCourse.com  Fact Sheet for Healthcare Providers: SeriousBroker.it  This test is not yet approved or cleared by the [place] FDA and  has been authorized for detection and/or diagnosis of SARS-CoV-2 by FDA under an Emergency Use Authorization (EUA).  This EUA will remain in effect (meanin g this test can be used) for the duration of  the COVID-19 declaration under Section 564(b)(1) of the Act, 21 U.S.C. section 360bbb-3(b)(1), unless the authorization is terminated or revoked sooner.  Influenza A by PCR NEGATIVE NEGATIVE Final   Influenza B by PCR NEGATIVE NEGATIVE Final    Comment: (NOTE) The Xpert Xpress  SARS-CoV-2/FLU/RSV plus assay is intended as an aid in the diagnosis of influenza from Nasopharyngeal swab specimens and should not be used as a sole basis for treatment. Nasal washings and aspirates are unacceptable for Xpert Xpress SARS-CoV-2/FLU/RSV testing.  Fact Sheet for Patients: BloggerCourse.com  Fact Sheet for Healthcare Providers: SeriousBroker.it  This test is not yet approved or cleared by the [place] FDA and has been authorized for detection and/or diagnosis of SARS-CoV-2 by FDA under an Emergency Use Authorization (EUA). This EUA will remain in effect (meaning this test can be used) for the duration of the COVID-19 declaration under Section 564(b)(1) of the Act, 21 U.S.C. section 360bbb-3(b)(1), unless the authorization is terminated or revoked.  Performed at [facility], [address] [place], [place] [address]   Culture, blood (routine x 2)     Status: None   Collection Time: 06/16/21  2:05 AM   Specimen: BLOOD LEFT HAND  Result Value Ref Range Status   Specimen Description BLOOD LEFT HAND  Final   Special Requests   Final    BOTTLES DRAWN AEROBIC ONLY Blood Culture adequate volume   Culture   Final    NO GROWTH 5 DAYS Performed at [facility], [address], [place], [place] [address]    Report Status 06/21/2021 FINAL  Final  Culture, blood (routine x 2)     Status: None   Collection Time: 06/16/21  2:26 AM   Specimen: BLOOD RIGHT HAND  Result Value Ref Range Status   Specimen Description BLOOD RIGHT HAND  Final   Special Requests   Final    BOTTLES DRAWN AEROBIC ONLY Blood Culture adequate volume   Culture   Final    NO GROWTH 5 DAYS Performed at [facility], [address], [place], [place] [address]    Report Status 06/21/2021 FINAL  Final  Culture, Urine     Status: Abnormal   Collection Time: 06/17/21  3:11 PM   Specimen: Urine, Random  Result Value Ref Range Status    Specimen Description URINE, RANDOM  Final   Special Requests   Final    NONE Performed at [facility], [address] [place], [place] [address]    Culture MULTIPLE SPECIES PRESENT, SUGGEST RECOLLECTION (A)  Final   Report Status 06/18/2021 FINAL  Final    Radiology Studies: No results found.   Marlin Canary DO [facility]  If 7PM-7AM, please contact night-coverage [email or web address] 06/24/2021, 1:00 PM

## 2021-06-25 DIAGNOSIS — R339 Retention of urine, unspecified: Secondary | ICD-10-CM | POA: Diagnosis not present

## 2021-06-25 DIAGNOSIS — R4182 Altered mental status, unspecified: Secondary | ICD-10-CM | POA: Diagnosis not present

## 2021-06-25 DIAGNOSIS — N179 Acute kidney failure, unspecified: Secondary | ICD-10-CM | POA: Diagnosis not present

## 2021-06-25 LAB — CBC WITH DIFFERENTIAL/PLATELET
Abs Immature Granulocytes: 0.45 10*3/uL — ABNORMAL HIGH (ref 0.00–0.07)
Basophils Absolute: 0.1 10*3/uL (ref 0.0–0.1)
Basophils Relative: 0 %
Eosinophils Absolute: 0.2 10*3/uL (ref 0.0–0.5)
Eosinophils Relative: 1 %
HCT: 22.7 % — ABNORMAL LOW (ref 39.0–52.0)
Hemoglobin: 7.2 g/dL — ABNORMAL LOW (ref 13.0–17.0)
Immature Granulocytes: 2 %
Lymphocytes Relative: 8 %
Lymphs Abs: 1.7 10*3/uL (ref 0.7–4.0)
MCH: 22.2 pg — ABNORMAL LOW (ref 26.0–34.0)
MCHC: 31.7 g/dL (ref 30.0–36.0)
MCV: 69.8 fL — ABNORMAL LOW (ref 80.0–100.0)
Monocytes Absolute: 1.8 10*3/uL — ABNORMAL HIGH (ref 0.1–1.0)
Monocytes Relative: 8 %
Neutro Abs: 18.7 10*3/uL — ABNORMAL HIGH (ref 1.7–7.7)
Neutrophils Relative %: 81 %
Platelets: 832 10*3/uL — ABNORMAL HIGH (ref 150–400)
RBC: 3.25 MIL/uL — ABNORMAL LOW (ref 4.22–5.81)
RDW: 19.8 % — ABNORMAL HIGH (ref 11.5–15.5)
WBC: 22.9 10*3/uL — ABNORMAL HIGH (ref 4.0–10.5)
nRBC: 0 % (ref 0.0–0.2)

## 2021-06-25 LAB — BASIC METABOLIC PANEL
Anion gap: 8 (ref 5–15)
BUN: 15 mg/dL (ref 8–23)
CO2: 23 mmol/L (ref 22–32)
Calcium: 9 mg/dL (ref 8.9–10.3)
Chloride: 97 mmol/L — ABNORMAL LOW (ref 98–111)
Creatinine, Ser: 0.8 mg/dL (ref 0.61–1.24)
GFR, Estimated: >60 mL/min (ref >60)
Glucose, Bld: 120 mg/dL — ABNORMAL HIGH (ref 70–99)
Potassium: 4.5 mmol/L (ref 3.5–5.1)
Sodium: 128 mmol/L — ABNORMAL LOW (ref 135–145)

## 2021-06-25 MED ORDER — COVID-19 MRNA VAC-TRIS(PFIZER) 30 MCG/0.3ML IM SUSP
0.3000 mL | Freq: Once | INTRAMUSCULAR | Status: AC
  Administered 2021-06-25: 0.3 mL via INTRAMUSCULAR
  Filled 2021-06-25: qty 0.3

## 2021-06-25 NOTE — TOC Progression Note (Addendum)
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   7/5 [facility] requested that patient have the 2nd booster and MD advised. Olegario Messier, admissions director informed that patient no longer on COVID precautions, but still on contact precautions due to MRSA positive.  7/6: Submitted for insurance authorization and auth received: Navi-Auth ID #[id number], effective 7/6 - 7/8, and next review date 7/8. Auth information provided to [place] at [facility]. Olegario Messier also informed that patient has had the 2nd COVID booster on 7/5 and a copy of this vaccination will come in his d/c packet. MD informed that insurance auth received and indicated that patient will d/c 7/7 and Olegario Messier at [facility] advised.  Requested MD order COVID test.  3:34 pm: Talked with wife and daughter and update provided re: d/c on Thursday to [facility] and auth received and patient getting 2nd COVID booster and the COVID card will be given to her.     Barriers to Discharge: Continued Medical Work up - Per MD, patient will be ready for d/c on 7/7.  Expected Discharge Plan and Services  [facility] for ST rehab.                                               Social Determinants of Health (SDOH) Interventions  No SDOH interventions requested or needed at this time.  Readmission Risk Interventions Readmission Risk Prevention Plan 06/14/2021 09/12/2020 03/12/2020  Post Dischage Appt Complete - -  Medication Screening Complete - -  Transportation Screening Complete Complete -  PCP or Specialist Appt within 3-5 Days - Complete -  HRI or Home Care Consult - Complete -  Social Work  Consult for Recovery Care Planning/Counseling - Complete -  Palliative Care Screening - Not Applicable -  Medication Review Oceanographer) - Complete Complete  Some recent data might be hidden

## 2021-06-25 NOTE — Progress Notes (Signed)
PROGRESS NOTE  Lonnie [name] VZC:[id number] DOB: 05-26-1951   PCP: [name], MD  Patient is from: Home  DOA: 06/15/2021 LOS: 9  Chief complaints:  Confusion, generalized weakness and urinary retention  Brief Narrative / Interim history: 70 year old M with PMH of dementia, PAD s/p mechanical thrombectomy and stenting of occluded femoral popliteal bypass on 6/18, ongoing tobacco use, HTN, HLD, BPH, GERD, anxiety and depression returning with confusion, generalized weakness and urinary retention, and admitted for encephalopathy, AKI, UTI and also tested positive for COVID-19.  AKI resolved.  Briefly treated for UTI and COVID-19.  Therapy recommended SNF.  Vascular surgery and ID gave recommendations and signed off. He failed voiding trial.  He will be discharged with indwelling Foley for bladder rest.  Needs outpatient urology follow-up. He is still have leukocytosis and thrombocytosis.  Per ID, this is likely from his recent Decadron.  Therapy recommended SNF.   DISCUSSES PALLIATIVE CARE TO FOLLOW AT SNF with daughter    Assessment & Plan: AKI/azotemia: Likely mix of prerenal and postobstructive. AKI resolved.  Acute urinary retention with bladder over distention: Initially passed voiding trial but had 1.2 L UOP with cath on 6/29. -pulled out foley -- replace -Continue Flomax and Urecholine -outpatient urology follow up  Right foot ulcer/wound-low suspicion for foot infection by all experts PAD s/p recent mechanical thrombectomy and stent of occluded femoropopliteal bypass Right lower extremity pain-likely due to osteoarthritis and hematoma.  -Appreciate recommendation by infectious disease-discontinued antibiotics. -CT of right knee suggestive for hematoma but cannot exclude infection. -7/2-Dr. Alanda Slim discussed with ID, Dr. Ninetta Lights who thinks his leukocytosis and thrombocytosis is likely from Decadron -Continue Tylenol, tramadol and oxycodone as needed for pain based on  severity  Acute metabolic encephalopathy/delirium-could be from UTI, COVID-19 infection, polypharmacy, delirium in the setting of dementia.  Could be due to pain -Treat treatable causes -Reorientation and delirium precautions -Manage pain as above   Acute COVID-19 pneumonia: tested positive on 6/25.  Desaturated to 93% on RA.  CXR with increased patchy opacities RIGHT lung base. Cardiomegaly and mild diffuse pulmonary interstitial congestion. CRP 29. Now saturating at 98% on RA.  -Completed 3 days of remdesivir and 4 days of Decadron on 6/29 -Subcu Lovenox for VTE prophylaxis. -Supportive care  Elevated D-dimer: Likely due to COVID infection.  Low suspicion for PE.  LE [place] negative for DVT.  Presumptive UTI: UA with moderate pyuria and bacteriuria.  Urine culture with multiple species.  -Completed short course of ceftriaxone and IV cefepime on 6/29. -ID signed off.  Elevated liver enzymes: CK within normal.  RUQ [place] suggestive for hepatic steatosis otherwise negative.  HIV and acute hepatitis panel negative.  Improving off remdesivir and statin. -Continue trending   ICM/chronic combined CHF: TTE in 2018 with LVEF of 40 to 45%, G1-DD.  Appears to have significant edema in RLE likely from recent revascularization.  However, CXR with some interstitial venous congestion.  BNP 365 but no prior to compare to.  Net -9 L so far. Renal function stable. -hold home Lasix. -Monitor fluid and respiratory status  History of CAD s/p CABG: Stable. -Continue Lopressor, aspirin, Plavix   Chronic anxiety/depression: Stable -Continue home meds   Essential hypertension: Normotensive. -Cardiac meds as above   Hyperlipidemia -Holding Lipitor due to elevated liver enzymes   Iron deficiency anemia/ anemia of chronic disease: H&H relatively stable. -H&H stable. -Goal Hgb >8.0 given history of CAD   Generalized weakness: Ambulates using walker at baseline. -PT/OT-recommended SNF   Tobacco  use  disorder: smokes 2 to 3 cigarettes/day -Counseled family. -Nicotine patch  Leukocytosis/bandemia-demargination in the setting of steroid versus infectious process.  Improving.  Acute thrombocytosis: Felt to be reactive in the setting of Decadron.  He is now off Decadron.  -7/2-discussed with ID, Dr. Ninetta Lights who thinks his leukocytosis and thrombocytosis is likely from Decadron  Constipation: seems to have resolved. -Senokot-S twice daily  -MiraLAX twice daily as needed  GERD -Continue PPI       DVT prophylaxis:  enoxaparin (LOVENOX) injection 40 mg Start: 06/15/21 2200 SCDs Start: 06/15/21 2150  Code Status: DNR/DNI Family Communication: spoke with daughter  Level of care: Telemetry Medical Status is: Inpatient  Remains inpatient appropriate because:Unsafe d/c plan  Dispo:  Patient From: Home  Planned Disposition: Skilled Nursing Facility  Medically stable for discharge: 24-48 hours       Consultants:  Vascular surgery Infectious disease   Subjective: Working with PT  Objective: Vitals:   06/24/21 1802 06/24/21 2133 06/25/21 0555 06/25/21 0948  BP: 128/78 130/61 (!) 149/69 121/71  Pulse: 99 83 99 95  Resp: Temp: 98.4 F (36.9 C) 98.4 F (36.9 C) 98.5 F (36.9 C) 99.2 F (37.3 C)  TempSrc: Oral   Oral  SpO2: 98% 99% 100% 98%  Weight:  79.1 kg    Height:        Intake/Output Summary (Last 24 hours) at 06/25/2021 1406 Last data filed at 06/25/2021 1042 Gross per 24 hour  Intake 840 ml  Output 2150 ml  Net -1310 ml   Filed Weights   06/16/21 0300 06/22/21 2015 06/24/21 2133  Weight: 84.9 kg 79.1 kg 79.1 kg    Examination:  General: Appearance:    Elderly male in no acute distress     Lungs:     respirations unlabored  Heart:    Normal heart rate.    MS:   All extremities are intact.    Neurologic:   Awake, alert, sitting in chair washing up      Microbiology summarized: COVID-19 PCR positive. MRSA PCR screen  positive. Blood cultures NGTD. Urine culture with multiple species. Sch Meds:  Scheduled Meds:  aspirin EC  81 mg Oral Daily   bethanechol  10 mg Oral TID   Chlorhexidine Gluconate Cloth  6 each Topical Daily   citalopram  20 mg Oral Daily   clopidogrel  75 mg Oral Daily   COVID-19 mRNA Vac-TriS (Pfizer)  0.3 mL Intramuscular ONCE-1600   enoxaparin (LOVENOX) injection  40 mg Subcutaneous Q24H   ferrous fumarate-b12-vitamic C-folic acid  1 capsule Oral Daily   gabapentin  100 mg Oral BID   irbesartan  75 mg Oral Daily   melatonin  10 mg Oral QHS   metoprolol tartrate  12.5 mg Oral BID   multivitamin with minerals  1 tablet Oral Daily   nicotine  7 mg Transdermal Daily   pantoprazole  40 mg Oral QPM   QUEtiapine  100 mg Oral BID   senna-docusate  1 tablet Oral BID   sodium chloride flush  3 mL Intravenous Once   tamsulosin  0.8 mg Oral QPM   Continuous Infusions:   PRN Meds:.acetaminophen, albuterol, clonazePAM, guaiFENesin-dextromethorphan, oxyCODONE, polyethylene glycol, prochlorperazine, traMADol  Antimicrobials: Anti-infectives (From admission, onward)    Start     Dose/Rate Route Frequency Ordered Stop   06/17/21 1400  ceFEPIme (MAXIPIME) 2 g in sodium chloride 0.9 % 100 mL IVPB  2 g 200 mL/hr over 30 Minutes Intravenous Every 12 hours 06/17/21 1258 06/19/21 0955   06/17/21 1000  remdesivir 100 mg in sodium chloride 0.9 % 100 mL IVPB       See Hyperspace for full Linked Orders Report.   100 mg 200 mL/hr over 30 Minutes Intravenous Daily 06/16/21 0135 06/18/21 1026   06/16/21 2200  cefTRIAXone (ROCEPHIN) 2 g in sodium chloride 0.9 % 100 mL IVPB  Status:  Discontinued        2 g 200 mL/hr over 30 Minutes Intravenous Every 24 hours 06/15/21 2304 06/17/21 1218   06/16/21 2200  vancomycin (VANCOREADY) IVPB 1000 mg/200 mL  Status:  Discontinued        1,000 mg 200 mL/hr over 60 Minutes Intravenous Every 12 hours 06/16/21 0820 06/17/21 1218   06/16/21 0915  vancomycin  (VANCOREADY) IVPB 1750 mg/350 mL        1,750 mg 175 mL/hr over 120 Minutes Intravenous  Once 06/16/21 0820 06/16/21 1114   06/16/21 0230  remdesivir 200 mg in sodium chloride 0.9% 250 mL IVPB       See Hyperspace for full Linked Orders Report.   200 mg 580 mL/hr over 30 Minutes Intravenous Once 06/16/21 0135 06/16/21 0324   06/15/21 2230  linezolid (ZYVOX) IVPB 600 mg  Status:  Discontinued        600 mg 300 mL/hr over 60 Minutes Intravenous Every 12 hours 06/15/21 2228 06/16/21 0811   06/15/21 2115  cefTRIAXone (ROCEPHIN) 2 g in sodium chloride 0.9 % 100 mL IVPB        2 g 200 mL/hr over 30 Minutes Intravenous  Once 06/15/21 2114 06/15/21 2328        I have personally reviewed the following labs and images: CBC: Recent Labs  Lab 06/20/21 0336 06/21/21 0603 06/22/21 0831 06/23/21 0639 06/24/21 0954 06/25/21 0338  WBC 25.4* 20.1* 21.6* 20.9* 21.9* 22.9*  NEUTROABS 20.9* 16.6* 17.4* 16.8*  --  18.7*  HGB 8.7* 8.3* 8.3* 7.7* 7.6* 7.2*  HCT 27.6* 27.5* 27.9* 25.0* 25.2* 22.7*  MCV 70.2* 71.8* 73.0* 70.4* 70.4* 69.8*  PLT 867* 919* 950* 873* 862* 832*   BMP &GFR Recent Labs  Lab 06/19/21 1357 06/20/21 0336 06/21/21 0603 06/22/21 0445 06/22/21 0831 06/24/21 0954 06/25/21 0338  NA 131* 132* 133*  --  135 128* 128*  K 4.5 4.3 4.3  --  4.8 4.2 4.5  CL 98 102 101  --  101 97* 97*  CO2 21* 22 22  --  22 22 23   GLUCOSE 147* 118* 141*  --  115* 138* 120*  BUN 20 19 22   --  20 17 15   CREATININE 0.86 0.88 1.00 0.87 0.82 0.78 0.80  CALCIUM 9.2 9.0 9.1  --  9.3 8.6* 9.0  MG 2.0 2.1 2.2  --  2.5*  --   --   PHOS 3.5 3.8 3.2  --  3.3  --   --    Estimated Creatinine Clearance: 98.9 mL/min (by C-G formula based on SCr of 0.8 mg/dL). Liver & Pancreas: Recent Labs  Lab 06/19/21 1357 06/20/21 0336 06/21/21 0603 06/22/21 0831  AST 114* 69* 43*  --   ALT 223* 173* 131*  --   ALKPHOS 135* 125 117  --   BILITOT 0.6 0.6 0.9  --   PROT 7.8 7.2 7.4  --   ALBUMIN 3.0* 2.7* 2.7*  2.8*   No results for input(s): LIPASE, AMYLASE in the last 168  hours. No results for input(s): AMMONIA in the last 168 hours. Diabetic: No results for input(s): HGBA1C in the last 72 hours. No results for input(s): GLUCAP in the last 168 hours. Cardiac Enzymes: No results for input(s): CKTOTAL, CKMB, CKMBINDEX, TROPONINI in the last 168 hours.  No results for input(s): PROBNP in the last 8760 hours. Coagulation Profile: No results for input(s): INR, PROTIME in the last 168 hours. Thyroid Function Tests: No results for input(s): TSH, T4TOTAL, FREET4, T3FREE, THYROIDAB in the last 72 hours. Lipid Profile: No results for input(s): CHOL, HDL, LDLCALC, TRIG, CHOLHDL, LDLDIRECT in the last 72 hours. Anemia Panel: No results for input(s): VITAMINB12, FOLATE, FERRITIN, TIBC, IRON, RETICCTPCT in the last 72 hours.  Urine analysis:    Component Value Date/Time   COLORURINE YELLOW 06/15/2021 2218   APPEARANCEUR TURBID (A) 06/15/2021 2218   LABSPEC 1.014 06/15/2021 2218   PHURINE 5.0 06/15/2021 2218   GLUCOSEU NEGATIVE 06/15/2021 2218   GLUCOSEU NEGATIVE 04/12/2021 1323   HGBUR SMALL (A) 06/15/2021 2218   BILIRUBINUR NEGATIVE 06/15/2021 2218   KETONESUR 5 (A) 06/15/2021 2218   PROTEINUR 100 (A) 06/15/2021 2218   UROBILINOGEN 0.2 04/12/2021 1323   NITRITE NEGATIVE 06/15/2021 2218   LEUKOCYTESUR MODERATE (A) 06/15/2021 2218   Sepsis Labs: Invalid input(s): PROCALCITONIN, LACTICIDVEN  Microbiology: Recent Results (from the past 240 hour(s))  MRSA Next Gen by PCR, Nasal     Status: Abnormal   Collection Time: 06/15/21 10:21 PM   Specimen: Nasal Mucosa; Nasal Swab  Result Value Ref Range Status   MRSA by PCR Next Gen DETECTED (A) NOT DETECTED Final    Comment: RESULT CALLED TO, READ BACK BY AND VERIFIED WITH: E,BLUE RN @1153  06/16/21 EB (NOTE) The GeneXpert MRSA Assay (FDA approved for NASAL specimens only), is one component of a comprehensive MRSA colonization  surveillance program. It is not intended to diagnose MRSA infection nor to guide or monitor treatment for MRSA infections. Test performance is not FDA approved in patients less than 68 years old. Performed at [facility], [address] [place], [place] [address]   Resp Panel by RT-PCR (Flu A&B, Covid) Nasopharyngeal Swab     Status: Abnormal   Collection Time: 06/15/21 10:36 PM   Specimen: Nasopharyngeal Swab; Nasopharyngeal(NP) swabs in vial transport medium  Result Value Ref Range Status   SARS Coronavirus 2 by RT PCR POSITIVE (A) NEGATIVE Final    Comment: RESULT CALLED TO, READ BACK BY AND VERIFIED WITH: RN EMMANUEL CASTRO BY MESSA H. AT 0110 ON 6 26 2022 (NOTE) SARS-CoV-2 target nucleic acids are DETECTED.  The SARS-CoV-2 RNA is generally detectable in upper respiratory specimens during the acute phase of infection. Positive results are indicative of the presence of the identified virus, but do not rule out bacterial infection or co-infection with other pathogens not detected by the test. Clinical correlation with patient history and other diagnostic information is necessary to determine patient infection status. The expected result is Negative.  Fact Sheet for Patients: BloggerCourse.comhttps://www.fda.gov/media/152166/download  Fact Sheet for Healthcare Providers: SeriousBroker.ithttps://www.fda.gov/media/152162/download  This test is not yet approved or cleared by the [place] FDA and  has been authorized for detection and/or diagnosis of SARS-CoV-2 by FDA under an Emergency Use Authorization (EUA).  This EUA will remain in effect (meanin g this test can be used) for the duration of  the COVID-19 declaration under Section 564(b)(1) of the Act, 21 U.S.C. section 360bbb-3(b)(1), unless the authorization is terminated or revoked sooner.  Influenza A by PCR NEGATIVE NEGATIVE Final   Influenza B by PCR NEGATIVE NEGATIVE Final    Comment: (NOTE) The Xpert Xpress SARS-CoV-2/FLU/RSV plus  assay is intended as an aid in the diagnosis of influenza from Nasopharyngeal swab specimens and should not be used as a sole basis for treatment. Nasal washings and aspirates are unacceptable for Xpert Xpress SARS-CoV-2/FLU/RSV testing.  Fact Sheet for Patients: BloggerCourse.com  Fact Sheet for Healthcare Providers: SeriousBroker.it  This test is not yet approved or cleared by the [place] FDA and has been authorized for detection and/or diagnosis of SARS-CoV-2 by FDA under an Emergency Use Authorization (EUA). This EUA will remain in effect (meaning this test can be used) for the duration of the COVID-19 declaration under Section 564(b)(1) of the Act, 21 U.S.C. section 360bbb-3(b)(1), unless the authorization is terminated or revoked.  Performed at [facility], [address], [place], [place] [address]   Culture, blood (routine x 2)     Status: None   Collection Time: 06/16/21  2:05 AM   Specimen: BLOOD LEFT HAND  Result Value Ref Range Status   Specimen Description BLOOD LEFT HAND  Final   Special Requests   Final    BOTTLES DRAWN AEROBIC ONLY Blood Culture adequate volume   Culture   Final    NO GROWTH 5 DAYS Performed at [facility], [address], [place], [place] [address]    Report Status 06/21/2021 FINAL  Final  Culture, blood (routine x 2)     Status: None   Collection Time: 06/16/21  2:26 AM   Specimen: BLOOD RIGHT HAND  Result Value Ref Range Status   Specimen Description BLOOD RIGHT HAND  Final   Special Requests   Final    BOTTLES DRAWN AEROBIC ONLY Blood Culture adequate volume   Culture   Final    NO GROWTH 5 DAYS Performed at [facility], [address] [place], [place] [address]    Report Status 06/21/2021 FINAL  Final  Culture, Urine     Status: Abnormal   Collection Time: 06/17/21  3:11 PM   Specimen: Urine, Random  Result Value Ref Range Status   Specimen Description  URINE, RANDOM  Final   Special Requests   Final    NONE Performed at [facility], [address], [place], [place] [address]    Culture MULTIPLE SPECIES PRESENT, SUGGEST RECOLLECTION (A)  Final   Report Status 06/18/2021 FINAL  Final    Radiology Studies: No results found.   Marlin Canary DO [facility]  If 7PM-7AM, please contact night-coverage [email or web address] 06/25/2021, 2:06 PM

## 2021-06-25 NOTE — Plan of Care (Signed)
  Problem: Activity: Goal: Risk for activity intolerance will decrease Outcome: Progressing Note: Pt up in chair with PT assistance today. Tolerating it well.   Problem: Nutrition: Goal: Adequate nutrition will be maintained Outcome: Progressing   Problem: Elimination: Goal: Will not experience complications related to urinary retention Outcome: Progressing   Problem: Safety: Goal: Ability to remain free from injury will improve Outcome: Progressing Note: He is progressing in that he has remained safe, but continues to be impulsive but easily redirectable. Will continue aggressive safety measures.   Problem: Skin Integrity: Goal: Risk for impaired skin integrity will decrease Outcome: Progressing   Problem: Urinary Elimination: Goal: Signs and symptoms of infection will decrease Outcome: Progressing

## 2021-06-25 NOTE — Progress Notes (Signed)
Occupational Therapy Treatment Patient Details Name: Lonnie [name] MRN: [id number] DOB: Jun 04, 1951 Today's Date: 06/25/2021    History of present illness Lonnie [name] is a 70 y.o. male with progressive R foot/calf pain. Duplex revealed occlusion of right femoral to BK popliteal bypass. Acute ischemic skin changes of dorsum of right foot. Pt underwent peripheral vascular thrombectomy of R LE.   OT comments  Patient progressing and attempted to stand at sink today for grooming 1 task, but unable to release UE from vanity to perform, and required return to chair. Pt still needs assist of 2 to stand. Patient remains limited by RT LE pain  along with deficits noted below. Pt continues to demonstrate good rehab potential for SNF and would benefit from continued skilled OT to increase safety and independence with ADLs and functional transfers to allow pt to return home safely and reduce caregiver burden and fall risk.   Follow Up Recommendations  Supervision/Assistance - 24 hour;SNF    Equipment Recommendations  3 in 1 bedside commode;Wheelchair (measurements OT);Wheelchair cushion (measurements OT)    Recommendations for Other Services      Precautions / Restrictions Precautions Precautions: Fall Precaution Comments: painful R foot, impaired vision Restrictions Weight Bearing Restrictions: No       Mobility Bed Mobility Overal bed mobility: Needs Assistance Bed Mobility: Supine to Sit     Supine to sit: Supervision;HOB elevated          Transfers Overall transfer level: Needs assistance Equipment used: Rolling walker (2 wheeled) Transfers: Sit to/from Stand Sit to Stand: +2 physical assistance;+2 safety/equipment;Mod assist;Max assist;From elevated surface         General transfer comment: Pt attemepted x2 to stand from lower EOB with Max As of 2 but unable to come fully upright and initiated return to sitting with need of Mod As x2 for safety.  EOB elevated and  pt able to stand with Mod-Max As of 2 to RW. Pt performed later stand at sink: See ADL section.    Balance Overall balance assessment: Needs assistance Sitting-balance support: No upper extremity supported;Feet supported Sitting balance-Leahy Scale: Good     Standing balance support: Bilateral upper extremity supported;Single extremity supported;During functional activity Standing balance-Leahy Scale: Poor Standing balance comment: requires UE support, vc for upright posture                           ADL either performed or assessed with clinical judgement   ADL Overall ADL's : Needs assistance/impaired     Grooming: Supervision/safety;Sitting;Wash/dry face;Oral care;Wash/dry hands Grooming Details (indicate cue type and reason): Pt performed oral hygiene seated at sink. Pt encouraged to attempt standing at sink to wash face with unilateral UE support on vanity. Pt agreeable and stood from recliner with Mod Assist of 2 people pushing from recliner then pulling on sink/vanity. Pt unable to release one hand to hold washcloth and began to initiate return to sitting. Mod As of 2 for safe descent to chair where pt completed his grooming tasks in seated position.         Upper Body Dressing : Minimal assistance;Sitting;Bed level Upper Body Dressing Details (indicate cue type and reason): Long sitting in bed. Lower Body Dressing: Maximal assistance;Bed level Lower Body Dressing Details (indicate cue type and reason): Pt able to don LT sock in long sitting with supervision/ setup. Need of Total Assist to rewrap ace wrap on RT foot anddon RT sock. Unable to safely perform  standing LT dressing.   Toilet Transfer Details (indicate cue type and reason): Pt denied need of BSC. On Foley catheter. Please see Mobility section.         Functional mobility during ADLs: Moderate assistance;Maximal assistance;+2 for physical assistance General ADL Comments: Pt limited by confusion, pain in  R foot with weightbearing during mobility attempts. Further limited by vision impairments.     Vision Baseline Vision/History: Wears glasses Wears Glasses: At all times (Unavailable/at home.) Patient Visual Report: No change from baseline Vision Assessment?: Vision impaired- to be further tested in functional context Additional Comments: Pt decribes vision as "poor". Often keeps both or RT eye closed. Pt unable to clearly explain why he closes one eye. Pt denied diplopia.   Perception     Praxis      Cognition Arousal/Alertness: Awake/alert Behavior During Therapy: Impulsive Overall Cognitive Status: No family/caregiver present to determine baseline cognitive functioning Area of Impairment: Attention;Memory;Problem solving;Awareness;Following commands;Safety/judgement                 Orientation Level:  (not formally assessed this visit)     Following Commands: Follows one step commands inconsistently;Follows one step commands with increased time;Follows multi-step commands inconsistently;Follows multi-step commands with increased time Safety/Judgement: Decreased awareness of safety Awareness: Intellectual Problem Solving: Slow processing;Decreased initiation;Requires verbal cues;Difficulty sequencing;Requires tactile cues General Comments: Pt can express clear content and relatvent content at times, with intermittent verbalizations of apparent nonsense. When asked for clarification, pt unable.        Exercises     Shoulder Instructions       General Comments      Pertinent Vitals/ Pain       Pain Assessment: 0-10 Pain Score: 8  Faces Pain Scale: Hurts whole lot Pain Location: R foot with weightbearing Pain Descriptors / Indicators: Grimacing;Discomfort;Tender;Guarding Pain Intervention(s): Limited activity within patient's tolerance;Monitored during session;Repositioned  Home Living                                          Prior  Functioning/Environment              Frequency  Min 2X/week        Progress Toward Goals  OT Goals(current goals can now be found in the care plan section)  Progress towards OT goals: Progressing toward goals  Acute Rehab OT Goals Patient Stated Goal: go home OT Goal Formulation: Patient unable to participate in goal setting Time For Goal Achievement: 06/30/21 Potential to Achieve Goals: Good  Plan Discharge plan remains appropriate    Co-evaluation    PT/OT/SLP Co-Evaluation/Treatment: Yes Reason for Co-Treatment: Necessary to address cognition/behavior during functional activity;To address functional/ADL transfers   OT goals addressed during session: ADL's and self-care      AM-PAC OT "6 Clicks" Daily Activity     Outcome Measure   Help from another person eating meals?: None Help from another person taking care of personal grooming?: A Little Help from another person toileting, which includes using toliet, bedpan, or urinal?: A Lot Help from another person bathing (including washing, rinsing, drying)?: A Lot Help from another person to put on and taking off regular upper body clothing?: A Little Help from another person to put on and taking off regular lower body clothing?: A Lot 6 Click Score: 16    End of Session Equipment Utilized During Treatment: Gait belt;Rolling walker  OT Visit  Diagnosis: Unsteadiness on feet (R26.81);Other abnormalities of gait and mobility (R26.89);Muscle weakness (generalized) (M62.81);Pain;Other symptoms and signs involving cognitive function Pain - Right/Left: Right Pain - part of body: Leg   Activity Tolerance No increased pain   Patient Left in chair;with call bell/phone within reach;with chair alarm set   Nurse Communication Patient requests pain meds        Time: 8616-8372 OT Time Calculation (min): 39 min  Charges: OT Evaluation $OT Eval Low Complexity: 1 Low OT Treatments $Self Care/Home Management : 23-37  mins  Victorino Dike, OT Acute Rehab Services Office: [phone] 06/25/2021   Theodoro Clock 06/25/2021, 9:59 AM

## 2021-06-25 NOTE — Progress Notes (Signed)
Progress Note    06/25/2021 7:16 AM * No surgery found *  Subjective:  Says he right knee no longer hurts.   Vitals:   06/24/21 2133 06/25/21 0555  BP: 130/61 (!) 149/69  Pulse: 83 99  Resp: 17 18  Temp: 98.4 F (36.9 C) 98.5 F (36.9 C)  SpO2: 99% 100%    Physical Exam: General appearance: Awake, alert in no apparent distress Cardiac: Heart rate and rhythm are regular Respirations: Nonlabored Incisions: Right thigh and lower leg incisions are well healed. There is fullness along the proximal medial lower leg c/w fluid collection Extremities: Both feet are warm with intact sensation and motor function.  Xeroform in place over dorsum of right foot. No drainage   CBC    Component Value Date/Time   WBC 22.9 (H) 06/25/2021 0338   RBC 3.25 (L) 06/25/2021 0338   HGB 7.2 (L) 06/25/2021 0338   HGB 15.7 01/10/2020 1146   HCT 22.7 (L) 06/25/2021 0338   HCT 46.1 01/10/2020 1146   PLT 832 (H) 06/25/2021 0338   PLT 232 01/10/2020 1146   MCV 69.8 (L) 06/25/2021 0338   MCV 83 01/10/2020 1146   MCH 22.2 (L) 06/25/2021 0338   MCHC 31.7 06/25/2021 0338   RDW 19.8 (H) 06/25/2021 0338   RDW 12.8 01/10/2020 1146   LYMPHSABS 1.7 06/25/2021 0338   LYMPHSABS 2.5 03/01/2015 1414   MONOABS 1.8 (H) 06/25/2021 0338   EOSABS 0.2 06/25/2021 0338   EOSABS 0.4 03/01/2015 1414   BASOSABS 0.1 06/25/2021 0338   BASOSABS 0.1 03/01/2015 1414    BMET    Component Value Date/Time   NA 128 (L) 06/25/2021 0338   NA 142 01/10/2020 1146   K 4.5 06/25/2021 0338   CL 97 (L) 06/25/2021 0338   CO2 23 06/25/2021 0338   GLUCOSE 120 (H) 06/25/2021 0338   BUN 15 06/25/2021 0338   BUN 19 01/10/2020 1146   CREATININE 0.80 06/25/2021 0338   CREATININE 0.94 05/24/2021 1549   CALCIUM 9.0 06/25/2021 0338   GFRNONAA >60 06/25/2021 0338   GFRAA >60 09/12/2020 0357     Intake/Output Summary (Last 24 hours) at 06/25/2021 0716 Last data filed at 06/25/2021 0600 Gross per 24 hour  Intake 600 ml  Output  2525 ml  Net -1925 ml    HOSPITAL MEDICATIONS Scheduled Meds:  aspirin EC  81 mg Oral Daily   bethanechol  10 mg Oral TID   Chlorhexidine Gluconate Cloth  6 each Topical Daily   citalopram  20 mg Oral Daily   clopidogrel  75 mg Oral Daily   enoxaparin (LOVENOX) injection  40 mg Subcutaneous Q24H   ferrous fumarate-b12-vitamic C-folic acid  1 capsule Oral Daily   gabapentin  100 mg Oral BID   irbesartan  75 mg Oral Daily   melatonin  10 mg Oral QHS   metoprolol tartrate  12.5 mg Oral BID   multivitamin with minerals  1 tablet Oral Daily   nicotine  7 mg Transdermal Daily   pantoprazole  40 mg Oral QPM   QUEtiapine  100 mg Oral BID   senna-docusate  1 tablet Oral BID   sodium chloride flush  3 mL Intravenous Once   tamsulosin  0.8 mg Oral QPM   Continuous Infusions: PRN Meds:.acetaminophen, albuterol, clonazePAM, guaiFENesin-dextromethorphan, oxyCODONE, polyethylene glycol, prochlorperazine, traMADol  Assessment and Plan: Recent thrombolysis (06/07/2021) of right femoral to below the knee popliteal bypass with propatent PTFE. Developed swelling of right popliteal space>>arterial duplex fluid collection  about the popliteal fossa region likely due to extravasation during thrombolysis. He is afebrile. This area is nontender without increased warmth. His able to extend his right knee. Right foot warm with intact sensation and motor function.  Wendi Maya, PA-C [facility] [phone] 06/25/2021  7:16 AM

## 2021-06-25 NOTE — Progress Notes (Signed)
Physical Therapy Treatment Patient Details Name: Lonnie [name] MRN: [id number] DOB: March 05, 1951 Today's Date: 06/25/2021    History of Present Illness Lonnie OLLINGER is a 70 y.o. male with progressive R foot/calf pain. Duplex revealed occlusion of right femoral to BK popliteal bypass. Acute ischemic skin changes of dorsum of right foot. Pt underwent peripheral vascular thrombectomy of R LE.    PT Comments    Pt pleasant on entry, agreeable to therapy. Pt continues to be limited by poor safety awareness especially in presence of R LE pain with weightbearing. Pt is also limited by generalized weakness and decreased balance in standing due to weakness and R LE pain. While pt is very pleasant with sitting, once up in weightbearing he becomes more agitated and impulsive, requiring increased multimodal cuing for safety. Pt is currently supervision for bed mobility, mod-maxAx2 for transfers and maxAx2 for ambulation with RW. D/c plan remains appropriate at this time. PT will continue to follow acutely.   Follow Up Recommendations  SNF;Supervision/Assistance - 24 hour     Equipment Recommendations  None recommended by PT;Other (comment)    Recommendations for Other Services OT consult     Precautions / Restrictions Precautions Precautions: Fall Precaution Comments: painful R foot, impaired vision Restrictions Weight Bearing Restrictions: No    Mobility  Bed Mobility Overal bed mobility: Needs Assistance Bed Mobility: Supine to Sit     Supine to sit: Supervision;HOB elevated          Transfers Overall transfer level: Needs assistance Equipment used: Rolling walker (2 wheeled) Transfers: Sit to/from Stand Sit to Stand: +2 physical assistance;+2 safety/equipment;Mod assist;Max assist;From elevated surface         General transfer comment: Pt attempted x2 to stand from lower EOB with Max As of 2 but unable to come fully upright and initiated return to sitting with need  of Mod As x2 for safety.  EOB elevated and pt able to stand with Mod-Max As of 2 to RW. Pt performed later stand at sink: .  Ambulation/Gait Ambulation/Gait assistance: Max assist;+2 safety/equipment Gait Distance (Feet): 8 Feet Assistive device: Rolling walker (2 wheeled) Gait Pattern/deviations: Step-to pattern;Decreased step length - left;Decreased stance time - right;Trunk flexed Gait velocity: slowed Gait velocity interpretation: <1.31 ft/sec, indicative of household ambulator General Gait Details: maxAx2 for safety more than physical assist, pt limited by pain and impulsivity, wanting to sit as without waiting for chair behind him         Balance Overall balance assessment: Needs assistance Sitting-balance support: No upper extremity supported;Feet supported Sitting balance-Leahy Scale: Good     Standing balance support: Bilateral upper extremity supported;Single extremity supported;During functional activity Standing balance-Leahy Scale: Poor Standing balance comment: requires UE support, vc for upright posture                            Cognition Arousal/Alertness: Awake/alert Behavior During Therapy: Impulsive Overall Cognitive Status: No family/caregiver present to determine baseline cognitive functioning Area of Impairment: Attention;Memory;Problem solving;Awareness;Following commands;Safety/judgement                 Orientation Level:  (not formally assessed this visit)     Following Commands: Follows one step commands inconsistently;Follows one step commands with increased time;Follows multi-step commands inconsistently;Follows multi-step commands with increased time Safety/Judgement: Decreased awareness of safety Awareness: Intellectual Problem Solving: Slow processing;Decreased initiation;Requires verbal cues;Difficulty sequencing;Requires tactile cues General Comments: Pt can express clear content and relavent content at times,  with  intermittent verbalizations of apparent nonsense. When asked for clarification, pt unable.         General Comments General comments (skin integrity, edema, etc.): VSS on RA      Pertinent Vitals/Pain Pain Assessment: 0-10 Pain Score: 8  Faces Pain Scale: Hurts whole lot Pain Location: R foot with weightbearing Pain Descriptors / Indicators: Grimacing;Discomfort;Tender;Guarding Pain Intervention(s): Limited activity within patient's tolerance;Monitored during session;Repositioned     PT Goals (current goals can now be found in the care plan section) Acute Rehab PT Goals Patient Stated Goal: go home PT Goal Formulation: With patient Time For Goal Achievement: 07/01/21 Potential to Achieve Goals: Fair Progress towards PT goals: Progressing toward goals    Frequency    Min 3X/week      PT Plan Current plan remains appropriate    Co-evaluation PT/OT/SLP Co-Evaluation/Treatment: Yes Reason for Co-Treatment: Necessary to address cognition/behavior during functional activity;For patient/therapist safety PT goals addressed during session: Mobility/safety with mobility OT goals addressed during session: ADL's and self-care      AM-PAC PT "6 Clicks" Mobility   Outcome Measure  Help needed turning from your back to your side while in a flat bed without using bedrails?: None Help needed moving from lying on your back to sitting on the side of a flat bed without using bedrails?: None Help needed moving to and from a bed to a chair (including a wheelchair)?: Total Help needed standing up from a chair using your arms (e.g., wheelchair or bedside chair)?: Total Help needed to walk in hospital room?: Total Help needed climbing 3-5 steps with a railing? : Total 6 Click Score: 12    End of Session Equipment Utilized During Treatment: Gait belt Activity Tolerance: Patient limited by pain;Patient tolerated treatment well Patient left: in chair;with chair alarm set;with call  bell/phone within reach;Other (comment) Nurse Communication: Mobility status PT Visit Diagnosis: Other abnormalities of gait and mobility (R26.89);Pain;Difficulty in walking, not elsewhere classified (R26.2) Pain - Right/Left: Right Pain - part of body: Leg     Time: 1572-6203 PT Time Calculation (min) (ACUTE ONLY): 39 min  Charges:  $Gait Training: 8-22 mins                     Jerrie Gullo B. Beverely Risen PT, DPT Acute Rehabilitation Services Pager [phone] Office [phone]    Elon Alas Fleet 06/25/2021, 10:23 AM

## 2021-06-26 ENCOUNTER — Inpatient Hospital Stay (HOSPITAL_COMMUNITY): Payer: Medicare Other

## 2021-06-26 DIAGNOSIS — L97511 Non-pressure chronic ulcer of other part of right foot limited to breakdown of skin: Secondary | ICD-10-CM | POA: Diagnosis not present

## 2021-06-26 DIAGNOSIS — R4182 Altered mental status, unspecified: Secondary | ICD-10-CM | POA: Diagnosis not present

## 2021-06-26 LAB — IRON AND TIBC
Iron: 11 ug/dL — ABNORMAL LOW (ref 45–182)
Saturation Ratios: 4 % — ABNORMAL LOW (ref 17.9–39.5)
TIBC: 245 ug/dL — ABNORMAL LOW (ref 250–450)
UIBC: 234 ug/dL

## 2021-06-26 LAB — OSMOLALITY, URINE: Osmolality, Ur: 297 mOsm/kg — ABNORMAL LOW (ref 300–900)

## 2021-06-26 LAB — SODIUM, URINE, RANDOM: Sodium, Ur: <10 mmol/L

## 2021-06-26 IMAGING — DX DG CHEST 1V PORT
1 series · 1 of 1 positions shown · non-contrast
Comparison: [DATE]

CLINICAL DATA: 70-year-old male with concern for pneumonia.

EXAM:
PORTABLE CHEST - 1 VIEW

[chest]
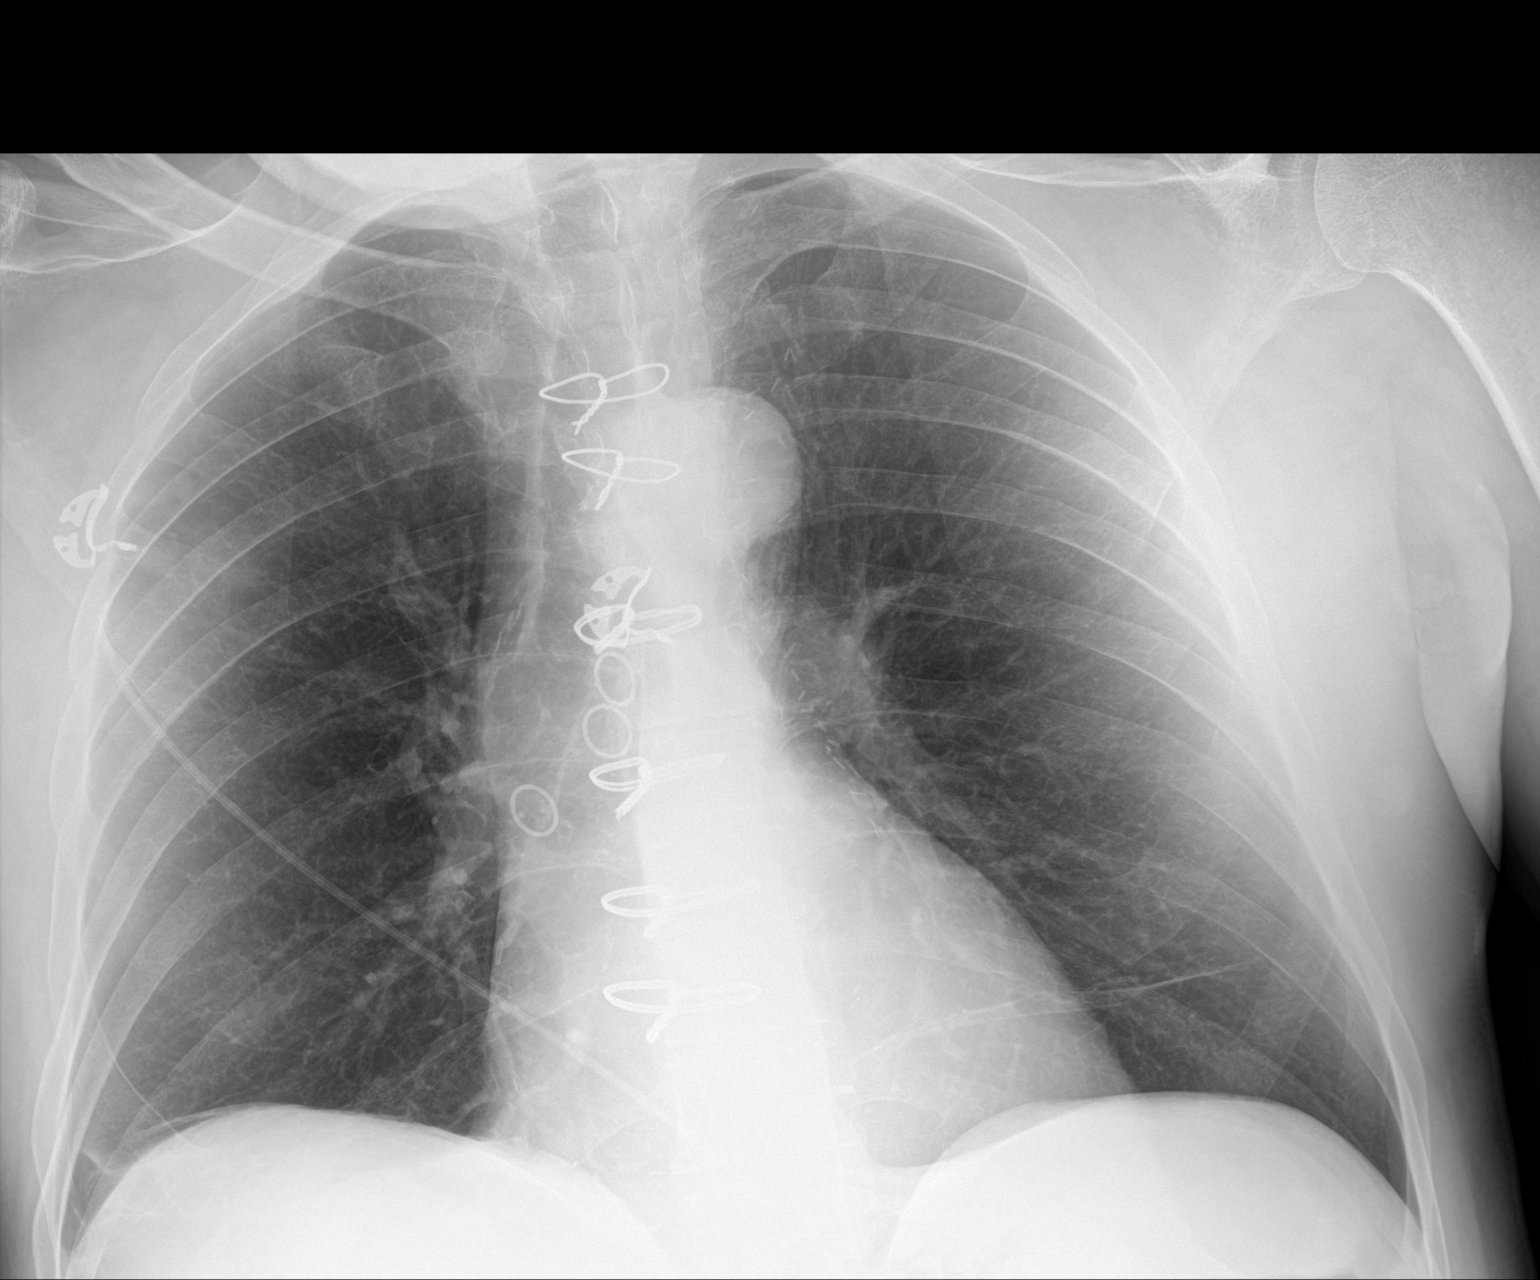

[1 of 1 positions shown; findings below may reference images not displayed]

FINDINGS: The mediastinal contours are within normal limits. No cardiomegaly.
Similar appearing postsurgical changes after coronary artery bypass
graft. The lungs are clear bilaterally without evidence of focal
consolidation, pleural effusion, or pneumothorax. No acute osseous
abnormality.
IMPRESSION: Significantly improved aeration of the lungs bilaterally. No acute
cardiopulmonary process.

## 2021-06-26 MED ORDER — "THROMBI-PAD 3""X3"" EX PADS"
1.0000 | MEDICATED_PAD | Freq: Once | CUTANEOUS | Status: DC
  Filled 2021-06-26: qty 1

## 2021-06-26 NOTE — Hospital Course (Signed)
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 Arlyn Dunning washout 03/12/2020 Dr. Durwin Nora Recent admission 05/1721 occluded right femoral pop bypass status post thrombolysis mechanical thrombectomy R femoropopliteal bypass graft and stent closure with Mynx closure and discharge 6/24  Readmit 6/25 generalized weakness confusion-found to have bladder retention 900 cc possible UTI COVID-positive on admission Rx--Decadron remdesivir Started on cefepime for UTI concern

## 2021-06-26 NOTE — Plan of Care (Signed)
  Problem: Health Behavior/Discharge Planning: Goal: Ability to manage health-related needs will improve Outcome: Progressing   Problem: Nutrition: Goal: Adequate nutrition will be maintained Outcome: Progressing   Problem: Elimination: Goal: Will not experience complications related to urinary retention Outcome: Progressing   Problem: Pain Managment: Goal: General experience of comfort will improve Outcome: Progressing   

## 2021-06-26 NOTE — Progress Notes (Signed)
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 Arlyn Dunning washout 03/12/2020 Dr. Durwin Nora Recent admission 06/07/21 occluded right femoral pop bypass status post thrombolysis mechanical thrombectomy R femoropopliteal bypass graft and stent closure with Mynx closure and discharge 6/24  Readmit 6/25 generalized weakness confusion-found to have bladder retention 900 cc possible UTI COVID-positive on admission Rx--Decadron remdesivir Started on cefepime for UTI concern Had AKI on admission and was treated for UTI which was unfounded on cultures-patient failed voiding trial and will need outpatient follow-up May need palliative follow-up at skilled facility on discharge  Hospital-Problem based course  LUTS/BPH Foley catheter placed on admission and will need outpatient follow-up for urodynamic studies with urologist-continue bethanechol 10 3 times daily, Flomax 0.8 nightly Metabolic encephaly apathy on admission-DDx COVID versus BPH causing the same Patient orientable somewhat appropriate-caution with meds in the outpatient setting Careful with pain meds COVID-19 on 6/25 Relatively asymptomatic currently-Rx earlier in hospital stay 3 days remdesivir Decadron ending on 6/29 Leukocytosis/thrombocytosis per ID is secondary to steroids CXR performed 7/6 shows no residual infection or inflammation and expect will need CBC in outpatient setting in 1 to 2 weeks Elevated D-dimer is likely secondary to COVID and lower extremity ultrasound was negative hypovolemic hypotonic  hyponatremia Urine sodium less than 10 urine  indicating hypotonic state Restrict fluid to 1200 cc, allow solute in diet--if no improvement give gentle saline and recheck urine studies If sodium above 130/132 can probably discharge  Severe PAD and still smoker status post recent admission right femoropopliteal bypass mechanical thrombectomy 6/17 Smoking cessation tantamount-aspirin 81, Plavix 75 and statin Cautious tramadol 50 every 12 as needed, oxycodone 5 mg every 8 as needed Systolic/diastolic heart failure EF 40-45% diastolic dysfunction noted Compensated-holding home Lasix CABG X4 in 2021 Outpatient cardiology follow-up-continue afebrile 75, Lopressor 12.5 twice daily Subdural hematoma status post craniectomy 06/2019 with residual neurological deficits Deficits are minimal at this time-continue Celexa 20 Seroquel 100 twice daily   DVT prophylaxis: Lovenox Code Status: DNR Family Communication: None present at bedside today Disposition:  Status is: Inpatient  Remains inpatient appropriate because:Hemodynamically unstable, IV treatments appropriate due to intensity of illness or inability to take PO, and Inpatient level of care appropriate due to severity of illness  Dispo:  Patient From: Home  Planned Disposition: Skilled Nursing Facility  Medically stable for discharge: No         Consultants:    Procedures:   Antimicrobials:     Subjective: Coherent alert and oriented to place time person the only thing that he misses is the month No fever Complains of right lower extremity pain I examined outside of the wound but did not remove the iodoform as it was well adhered He seems content and is asking when he is going home No fever, chills, cough, cold, nausea, vomiting  Objective: Vitals:   06/25/21 2201 06/26/21 0500 06/26/21 0512 06/26/21 0914  BP: 107/83  115/62 (!) 131/51  Pulse: 96  84 86  Resp: 17  16 17   Temp: 98.4 F (36.9 C)  98.3 F (36.8 C) 98.7 F  (37.1 C)  TempSrc:    Oral  SpO2: 100%  100% 99%  Weight: 79.1 kg 79 kg    Height:        Intake/Output Summary (Last 24 hours) at 06/26/2021 1703 Last data filed at 06/26/2021 1300 Gross per 24 hour  Intake 480 ml  Output 1700 ml  Net -1220 ml   Filed Weights   06/24/21 2133 06/25/21 2201 06/26/21 0500  Weight: 79.1 kg 79.1 kg 79 kg    Examination: EOMI NCAT no focal deficit Smiling appropriate remembers readily my name after just repeating it once Chest clear no added sound no rales no rhonchi Abdomen soft no rebound no guarding post laparoscopy scars noted Foot examined but did not examine underlying wound under the Mepilex/iodoform gauze Moves 4 limbs without obvious deficit    Data Reviewed: personally reviewed   CBC    Component Value Date/Time   WBC 22.9 (H) 06/25/2021 0338   RBC 3.25 (L) 06/25/2021 0338   HGB 7.2 (L) 06/25/2021 0338   HGB 15.7 01/10/2020 1146   HCT 22.7 (L) 06/25/2021 0338   HCT 46.1 01/10/2020 1146   PLT 832 (H) 06/25/2021 0338   PLT 232 01/10/2020 1146   MCV 69.8 (L) 06/25/2021 0338   MCV 83 01/10/2020 1146   MCH 22.2 (L) 06/25/2021 0338   MCHC 31.7 06/25/2021 0338   RDW 19.8 (H) 06/25/2021 0338   RDW 12.8 01/10/2020 1146   LYMPHSABS 1.7 06/25/2021 0338   LYMPHSABS 2.5 03/01/2015 1414   MONOABS 1.8 (H) 06/25/2021 0338   EOSABS 0.2 06/25/2021 0338   EOSABS 0.4 03/01/2015 1414   BASOSABS 0.1 06/25/2021 0338   BASOSABS 0.1 03/01/2015 1414   CMP Latest Ref Rng & Units 06/25/2021 06/24/2021 06/22/2021  Glucose 70 - 99 mg/dL 485(I) 627(O) 350(K)  BUN 8 - 23 mg/dL 15 17 20   Creatinine 0.61 - 1.24 mg/dL 9.38 1.82  Sodium 135 - 145 mmol/L 128(L) 128(L) 135  Potassium 3.5 - 5.1 mmol/L 4.5 4.2 4.8  Chloride 98 - 111 mmol/L 97(L) 97(L) 101  CO2 22 - 32 mmol/L 23 22 22   Calcium 8.9 - 10.3 mg/dL 9.0 9.93) 9.3  Total Protein 6.5 - 8.1 g/dL - - -  Total Bilirubin 0.3 - 1.2 mg/dL - - -  Alkaline Phos 38 - 126 U/L - - -  AST 15 - 41 U/L - - -   ALT 0 - 44 U/L - - -     Radiology Studies: DG CHEST PORT 1 VIEW  Result Date: 06/26/2021 CLINICAL DATA:  70 year old male with concern for pneumonia. EXAM: PORTABLE CHEST - 1 VIEW COMPARISON:  06/16/2021 FINDINGS: The mediastinal contours are within normal limits. No cardiomegaly. Similar appearing postsurgical changes after coronary artery bypass graft. The lungs are clear bilaterally without evidence of focal consolidation, pleural effusion, or pneumothorax. No acute osseous abnormality. IMPRESSION: Significantly improved aeration of the lungs bilaterally. No acute cardiopulmonary process. Electronically Signed   By: 73 MD   On: 06/26/2021 09:12     Scheduled Meds:  aspirin EC  81 mg Oral Daily   bethanechol  10 mg Oral TID   Chlorhexidine Gluconate Cloth  6 each Topical Daily   citalopram  20 mg Oral Daily   clopidogrel  75 mg Oral Daily   enoxaparin (LOVENOX) injection  40 mg Subcutaneous Q24H   ferrous fumarate-b12-vitamic C-folic acid  1 capsule Oral Daily   gabapentin  100 mg Oral BID   irbesartan  75 mg Oral Daily   melatonin  10 mg Oral  QHS   metoprolol tartrate  12.5 mg Oral BID   multivitamin with minerals  1 tablet Oral Daily   nicotine  7 mg Transdermal Daily   pantoprazole  40 mg Oral QPM   QUEtiapine  100 mg Oral BID   senna-docusate  1 tablet Oral BID   sodium chloride flush  3 mL Intravenous Once   tamsulosin  0.8 mg Oral QPM   Continuous Infusions:   LOS: 10 days   Time spent: 30  Rhetta Mura, MD [facility] To contact the attending provider between 7A-7P or the covering provider during after hours 7P-7A, please log into the web site [email or web address] and access using universal [place] password for that web site. If you do not have the password, please call the hospital operator.  06/26/2021, 5:03 PM

## 2021-06-26 NOTE — Progress Notes (Signed)
Called to bedside to evaluate bleeding from right knee.  He is status post endovascular thrombolysis of right femoral-popliteal bypass graft 06/07/21-06/08/21 complicated by perforation during initial thrombolysis.   He was discharged 06/14/21 but readmitted 06/15/21 with leukocytosis, urinary retention, COVID infection. On 06/19/21 a popliteal fossa hematoma was noted which was managed conservatively.   Earlier this evening, he had spontaneous drainage of the hematoma. Upon my arrival, his bed was soaked in old blood. There was no active bleeding from the wound. I provoked the calf and could only drain a small amount of remaining hematoma. He has doppler flow in the anterior tibial artery, I could not find any posterior tibial flow on bedside exam tonight. The dorsum of the right foot has a chronic eschar which appears similar to previous descriptions.  I re-wrapped the leg with the help of the nursing staff. Plan local wound care. Keep clean, dry gauze over medial calf. Cover with ACE wrap. Change daily and as needed. Continue xeroform to dorsum of foot. Will re-evaluate in the morning.    Rande Brunt. Lenell Antu, MD [facility] Phone Number: [phone] 06/27/2021 12:09 AM

## 2021-06-26 NOTE — Significant Event (Signed)
Patient started developing spontaneous bleed from his right leg just below his right knee and we have been placing pressure on it for almost 10 minutes but still continues to bleed.  We placed thrombin pad on site.  I consulted Dr. Lenell Antu on-call vascular surgeon will be seeing patient.  We will be getting CBC PT/INR and type and screen.  Midge Minium

## 2021-06-26 NOTE — Progress Notes (Signed)
I was asked to see pt regarding bleeding RLE wound. Significant blood loss with manual pressure being held to stop bleeding. Dr. Toniann Fail came to bedside. Old dressing removed. Old surgical site was oozing (not pulsatile). Thrombi pad with gauze and ace wrap applied to achieve hemastasis. Vascular svc contacted by Dr. Kirtland Bouchard.   2314-98.24F, HR 79, 104/59(73), RR 18 with sats 99% on RA

## 2021-06-27 ENCOUNTER — Inpatient Hospital Stay (HOSPITAL_COMMUNITY): Payer: Medicare Other | Admitting: Anesthesiology

## 2021-06-27 ENCOUNTER — Encounter (HOSPITAL_COMMUNITY)
Admission: EM | Disposition: A | Discharge status: Skilled Nursing Facility | Payer: Self-pay | Source: Home / Self Care | Attending: Family Medicine

## 2021-06-27 DIAGNOSIS — T827XXA Infection and inflammatory reaction due to other cardiac and vascular devices, implants and grafts, initial encounter: Secondary | ICD-10-CM

## 2021-06-27 DIAGNOSIS — G9341 Metabolic encephalopathy: Secondary | ICD-10-CM | POA: Diagnosis not present

## 2021-06-27 HISTORY — PX: APPLICATION OF WOUND VAC: SHX5189

## 2021-06-27 HISTORY — PX: LIGATION OF CILIAC ARTERY: SHX6662

## 2021-06-27 HISTORY — PX: I & D EXTREMITY: SHX5045

## 2021-06-27 HISTORY — PX: VEIN REPAIR: SHX6524

## 2021-06-27 LAB — CBC
HCT: 23.5 % — ABNORMAL LOW (ref 39.0–52.0)
HCT: 19.5 % — ABNORMAL LOW (ref 39.0–52.0)
Hemoglobin: 7.3 g/dL — ABNORMAL LOW (ref 13.0–17.0)
Hemoglobin: 5.9 g/dL — CL (ref 13.0–17.0)
MCH: 21.7 pg — ABNORMAL LOW (ref 26.0–34.0)
MCH: 21.9 pg — ABNORMAL LOW (ref 26.0–34.0)
MCHC: 31.1 g/dL (ref 30.0–36.0)
MCHC: 30.3 g/dL (ref 30.0–36.0)
MCV: 69.9 fL — ABNORMAL LOW (ref 80.0–100.0)
MCV: 72.5 fL — ABNORMAL LOW (ref 80.0–100.0)
Platelets: 896 10*3/uL — ABNORMAL HIGH (ref 150–400)
Platelets: 829 10*3/uL — ABNORMAL HIGH (ref 150–400)
RBC: 3.36 MIL/uL — ABNORMAL LOW (ref 4.22–5.81)
RBC: 2.69 MIL/uL — ABNORMAL LOW (ref 4.22–5.81)
RDW: 19.5 % — ABNORMAL HIGH (ref 11.5–15.5)
RDW: 19.6 % — ABNORMAL HIGH (ref 11.5–15.5)
WBC: 20.4 10*3/uL — ABNORMAL HIGH (ref 4.0–10.5)
WBC: 19.8 10*3/uL — ABNORMAL HIGH (ref 4.0–10.5)
nRBC: 0 % (ref 0.0–0.2)
nRBC: 0 % (ref 0.0–0.2)

## 2021-06-27 LAB — GLUCOSE, CAPILLARY: Glucose-Capillary: 139 mg/dL — ABNORMAL HIGH (ref 70–99)

## 2021-06-27 LAB — RENAL FUNCTION PANEL
Albumin: 2.4 g/dL — ABNORMAL LOW (ref 3.5–5.0)
Anion gap: 8 (ref 5–15)
BUN: 12 mg/dL (ref 8–23)
CO2: 22 mmol/L (ref 22–32)
Calcium: 9.1 mg/dL (ref 8.9–10.3)
Chloride: 99 mmol/L (ref 98–111)
Creatinine, Ser: 0.76 mg/dL (ref 0.61–1.24)
GFR, Estimated: >60 mL/min (ref >60)
Glucose, Bld: 126 mg/dL — ABNORMAL HIGH (ref 70–99)
Phosphorus: 3.9 mg/dL (ref 2.5–4.6)
Potassium: 4.4 mmol/L (ref 3.5–5.1)
Sodium: 129 mmol/L — ABNORMAL LOW (ref 135–145)

## 2021-06-27 LAB — POCT I-STAT, CHEM 8
BUN: 13 mg/dL (ref 8–23)
Calcium, Ion: 1.27 mmol/L (ref 1.15–1.40)
Chloride: 101 mmol/L (ref 98–111)
Creatinine, Ser: 0.9 mg/dL (ref 0.61–1.24)
Glucose, Bld: 164 mg/dL — ABNORMAL HIGH (ref 70–99)
HCT: 24 % — ABNORMAL LOW (ref 39.0–52.0)
Hemoglobin: 8.2 g/dL — ABNORMAL LOW (ref 13.0–17.0)
Potassium: 4.9 mmol/L (ref 3.5–5.1)
Sodium: 133 mmol/L — ABNORMAL LOW (ref 135–145)
TCO2: 23 mmol/L (ref 22–32)

## 2021-06-27 LAB — PREPARE RBC (CROSSMATCH)

## 2021-06-27 LAB — PROTIME-INR
INR: 1.1 (ref 0.8–1.2)
Prothrombin Time: 14.5 seconds (ref 11.4–15.2)

## 2021-06-27 SURGERY — IRRIGATION AND DEBRIDEMENT EXTREMITY
Anesthesia: General | Site: Leg Lower | Laterality: Right

## 2021-06-27 MED ORDER — FENTANYL CITRATE (PF) 100 MCG/2ML IJ SOLN
INTRAMUSCULAR | Status: DC | PRN
  Administered 2021-06-27 (×2): 50 ug via INTRAVENOUS

## 2021-06-27 MED ORDER — SODIUM CHLORIDE 0.9% IV SOLUTION: Freq: Once | INTRAVENOUS | Status: DC

## 2021-06-27 MED ORDER — PHENYLEPHRINE HCL (PRESSORS) 10 MG/ML IV SOLN
INTRAVENOUS | Status: DC | PRN
  Administered 2021-06-27: 150 ug via INTRAVENOUS

## 2021-06-27 MED ORDER — LACTATED RINGERS IV SOLN: INTRAVENOUS | Status: DC | PRN

## 2021-06-27 MED ORDER — CALCIUM CHLORIDE 10 % IV SOLN
INTRAVENOUS | Status: DC | PRN
  Administered 2021-06-27 (×2): 250 mg via INTRAVENOUS

## 2021-06-27 MED ORDER — SUCCINYLCHOLINE CHLORIDE 20 MG/ML IJ SOLN
INTRAMUSCULAR | Status: DC | PRN
  Administered 2021-06-27: 100 mg via INTRAVENOUS

## 2021-06-27 MED ORDER — OXYCODONE HCL 5 MG/5ML PO SOLN: 5.0000 mg | Freq: Once | ORAL | Status: DC | PRN

## 2021-06-27 MED ORDER — ROCURONIUM BROMIDE 10 MG/ML (PF) SYRINGE
PREFILLED_SYRINGE | INTRAVENOUS | Status: DC | PRN
  Administered 2021-06-27: 30 mg via INTRAVENOUS
  Administered 2021-06-27: 10 mg via INTRAVENOUS

## 2021-06-27 MED ORDER — FUROSEMIDE 10 MG/ML IJ SOLN
20.0000 mg | Freq: Once | INTRAMUSCULAR | Status: AC
  Administered 2021-07-03: 20 mg via INTRAVENOUS
  Filled 2021-06-27 (×2): qty 2

## 2021-06-27 MED ORDER — PROPOFOL 10 MG/ML IV BOLUS
INTRAVENOUS | Status: DC | PRN
  Administered 2021-06-27: 70 mg via INTRAVENOUS

## 2021-06-27 MED ORDER — CEFAZOLIN SODIUM-DEXTROSE 2-3 GM-%(50ML) IV SOLR
INTRAVENOUS | Status: DC | PRN
  Administered 2021-06-27: 2 g via INTRAVENOUS

## 2021-06-27 MED ORDER — FENTANYL CITRATE (PF) 250 MCG/5ML IJ SOLN
INTRAMUSCULAR | Status: AC
  Filled 2021-06-27: qty 5

## 2021-06-27 MED ORDER — ACETAMINOPHEN 325 MG PO TABS
650.0000 mg | ORAL_TABLET | Freq: Once | ORAL | Status: DC
  Filled 2021-06-27: qty 2

## 2021-06-27 MED ORDER — SODIUM CHLORIDE 0.9 % IV SOLN: INTRAVENOUS | Status: DC

## 2021-06-27 MED ORDER — ONDANSETRON HCL 4 MG/2ML IJ SOLN: 4.0000 mg | Freq: Once | INTRAMUSCULAR | Status: DC | PRN

## 2021-06-27 MED ORDER — ALBUMIN HUMAN 5 % IV SOLN: INTRAVENOUS | Status: DC | PRN

## 2021-06-27 MED ORDER — FENTANYL CITRATE (PF) 100 MCG/2ML IJ SOLN: 25.0000 ug | INTRAMUSCULAR | Status: DC | PRN

## 2021-06-27 MED ORDER — OXYCODONE HCL 5 MG PO TABS: 5.0000 mg | ORAL_TABLET | Freq: Once | ORAL | Status: DC | PRN

## 2021-06-27 MED ORDER — SUGAMMADEX SODIUM 200 MG/2ML IV SOLN
INTRAVENOUS | Status: DC | PRN
  Administered 2021-06-27: 300 mg via INTRAVENOUS

## 2021-06-27 MED ORDER — PROPOFOL 10 MG/ML IV BOLUS
INTRAVENOUS | Status: AC
  Filled 2021-06-27: qty 20

## 2021-06-27 MED ORDER — DIPHENHYDRAMINE HCL 25 MG PO CAPS
25.0000 mg | ORAL_CAPSULE | Freq: Once | ORAL | Status: DC
  Filled 2021-06-27 (×2): qty 1

## 2021-06-27 MED ORDER — 0.9 % SODIUM CHLORIDE (POUR BTL) OPTIME
TOPICAL | Status: DC | PRN
  Administered 2021-06-27: 1000 mL

## 2021-06-27 SURGICAL SUPPLY — 48 items
ADH SKN CLS APL DERMABOND .7 (GAUZE/BANDAGES/DRESSINGS) ×1
BAG COUNTER SPONGE SURGICOUNT (BAG) ×2 IMPLANT
BAG SPNG CNTER NS LX DISP (BAG) ×1
BNDG ELASTIC 4X5.8 VLCR STR LF (GAUZE/BANDAGES/DRESSINGS) IMPLANT
BNDG ELASTIC 6X5.8 VLCR STR LF (GAUZE/BANDAGES/DRESSINGS) IMPLANT
BNDG GAUZE ELAST 4 BULKY (GAUZE/BANDAGES/DRESSINGS) IMPLANT
CANISTER SUCT 3000ML PPV (MISCELLANEOUS) ×2 IMPLANT
CANISTER WOUND CARE 500ML ATS (WOUND CARE) ×1 IMPLANT
CLIP VESOCCLUDE MED 6/CT (CLIP) ×1 IMPLANT
CLIP VESOCCLUDE SM WIDE 6/CT (CLIP) ×1 IMPLANT
COVER SURGICAL LIGHT HANDLE (MISCELLANEOUS) ×4 IMPLANT
DERMABOND ADVANCED (GAUZE/BANDAGES/DRESSINGS) ×1
DERMABOND ADVANCED .7 DNX12 (GAUZE/BANDAGES/DRESSINGS) ×1 IMPLANT
DRAPE INCISE IOBAN 66X45 STRL (DRAPES) ×3 IMPLANT
DRAPE ORTHO SPLIT 77X108 STRL (DRAPES) ×2
DRAPE SURG ORHT 6 SPLT 77X108 (DRAPES) ×1 IMPLANT
DRSG VAC ATS LRG SENSATRAC (GAUZE/BANDAGES/DRESSINGS) ×4 IMPLANT
DRSG VAC ATS MED SENSATRAC (GAUZE/BANDAGES/DRESSINGS) ×1 IMPLANT
ELECT REM PT RETURN 9FT ADLT (ELECTROSURGICAL) ×2
ELECTRODE REM PT RTRN 9FT ADLT (ELECTROSURGICAL) ×1 IMPLANT
GAUZE SPONGE 4X4 12PLY STRL (GAUZE/BANDAGES/DRESSINGS) ×2 IMPLANT
GLOVE SRG 8 PF TXTR STRL LF DI (GLOVE) ×1 IMPLANT
GLOVE SURG ENC MOIS LTX SZ7.5 (GLOVE) ×2 IMPLANT
GLOVE SURG UNDER POLY LF SZ8 (GLOVE) ×2
GOWN STRL REUS W/ TWL LRG LVL3 (GOWN DISPOSABLE) ×2 IMPLANT
GOWN STRL REUS W/ TWL XL LVL3 (GOWN DISPOSABLE) ×2 IMPLANT
GOWN STRL REUS W/TWL LRG LVL3 (GOWN DISPOSABLE) ×4
GOWN STRL REUS W/TWL XL LVL3 (GOWN DISPOSABLE) ×4
KIT BASIN OR (CUSTOM PROCEDURE TRAY) ×2 IMPLANT
KIT TURNOVER KIT B (KITS) ×2 IMPLANT
NS IRRIG 1000ML POUR BTL (IV SOLUTION) ×2 IMPLANT
PACK CV ACCESS (CUSTOM PROCEDURE TRAY) ×2 IMPLANT
PACK GENERAL/GYN (CUSTOM PROCEDURE TRAY) ×2 IMPLANT
PAD ARMBOARD 7.5X6 YLW CONV (MISCELLANEOUS) ×4 IMPLANT
STAPLER VISISTAT 35W (STAPLE) IMPLANT
SUT ETHILON 2 0 PSLX (SUTURE) IMPLANT
SUT ETHILON 3 0 PS 1 (SUTURE) IMPLANT
SUT MNCRL AB 4-0 PS2 18 (SUTURE) IMPLANT
SUT PROLENE 5 0 C 1 24 (SUTURE) ×3 IMPLANT
SUT PROLENE 6 0 BV (SUTURE) ×4 IMPLANT
SUT SILK 2 0 (SUTURE) ×6
SUT SILK 2-0 18XBRD TIE 12 (SUTURE) IMPLANT
SUT VIC AB 2-0 CT1 27 (SUTURE)
SUT VIC AB 2-0 CT1 TAPERPNT 27 (SUTURE) IMPLANT
SUT VIC AB 3-0 SH 27 (SUTURE)
SUT VIC AB 3-0 SH 27X BRD (SUTURE) IMPLANT
TOWEL GREEN STERILE (TOWEL DISPOSABLE) ×2 IMPLANT
WATER STERILE IRR 1000ML POUR (IV SOLUTION) ×2 IMPLANT

## 2021-06-27 NOTE — Progress Notes (Signed)
Subjective  -  No current complaints.  The patient had drainage from his below-knee popliteal incision last night that was evaluated by Dr. Lenell Antu.  This appeared to be old blood.  There was concern for infection today   Physical Exam:  I opened the medial below-knee incision where it had basically dehisced.  Clotted old blood was evacuated.  There was no active bleeding.  I did not see any obvious purulence however there did appear to be what could be purulent material on the dressing.  I probed the wound with a Q-tip.  A Q-tip appears to go all the way down to the anastomosis.  There was no active bleeding.  The wound was packed with a 4 x 4 gauze.  The dorsum of the foot continues to have superficial ulceration with dark eschar.       Assessment/Plan:    I discussed with the patient that given the large cavity and recent bleeding from his distal anastomosis is concerning.  Certainly if this is infected it could require removal of his bypass graft which would likely result in the need for amputation.  He remains pleasantly confused.  For now we will continue with wet-to-dry dressing changes twice a day which was communicated with the nurse.  He will be covered with broad-spectrum antibiotics.  He will likely need surgical exploration, however I will probably start with a CT scan to see if there is any obvious evidence of infection around his graft.  Lonnie [name] 06/27/2021 3:09 PM --  Vitals:   06/27/21 0554 06/27/21 0855  BP: 116/72 131/75  Pulse: 96 93  Resp:    Temp: 98.2 F (36.8 C) 99.4 F (37.4 C)  SpO2: 100% 97%    Intake/Output Summary (Last 24 hours) at 06/27/2021 1509 Last data filed at 06/27/2021 1300 Gross per 24 hour  Intake 180 ml  Output 1800 ml  Net -1620 ml     Laboratory CBC    Component Value Date/Time   WBC 20.4 (H) 06/27/2021 0159   HGB 7.3 (L) 06/27/2021 0159   HGB 15.7 01/10/2020 1146   HCT 23.5 (L) 06/27/2021 0159   HCT 46.1 01/10/2020  1146   PLT 896 (H) 06/27/2021 0159   PLT 232 01/10/2020 1146    BMET    Component Value Date/Time   NA 129 (L) 06/27/2021 0159   NA 142 01/10/2020 1146   K 4.4 06/27/2021 0159   CL 99 06/27/2021 0159   CO2 22 06/27/2021 0159   GLUCOSE 126 (H) 06/27/2021 0159   BUN 12 06/27/2021 0159   BUN 19 01/10/2020 1146   CREATININE 0.76 06/27/2021 0159   CREATININE 0.94 05/24/2021 1549   CALCIUM 9.1 06/27/2021 0159   GFRNONAA >60 06/27/2021 0159   GFRAA >60 09/12/2020 0357    COAG Lab Results  Component Value Date   INR 1.1 06/27/2021   INR 1.1 09/10/2020   INR 1.0 03/01/2020   No results found for: PTT  Antibiotics Anti-infectives (From admission, onward)    Start     Dose/Rate Route Frequency Ordered Stop   06/17/21 1400  ceFEPIme (MAXIPIME) 2 g in sodium chloride 0.9 % 100 mL IVPB        2 g 200 mL/hr over 30 Minutes Intravenous Every 12 hours 06/17/21 1258 06/19/21 0955   06/17/21 1000  remdesivir 100 mg in sodium chloride 0.9 % 100 mL IVPB       See Hyperspace for full Linked Orders Report.  100 mg 200 mL/hr over 30 Minutes Intravenous Daily 06/16/21 0135 06/18/21 1026   06/16/21 2200  cefTRIAXone (ROCEPHIN) 2 g in sodium chloride 0.9 % 100 mL IVPB  Status:  Discontinued        2 g 200 mL/hr over 30 Minutes Intravenous Every 24 hours 06/15/21 2304 06/17/21 1218   06/16/21 2200  vancomycin (VANCOREADY) IVPB 1000 mg/200 mL  Status:  Discontinued        1,000 mg 200 mL/hr over 60 Minutes Intravenous Every 12 hours 06/16/21 0820 06/17/21 1218   06/16/21 0915  vancomycin (VANCOREADY) IVPB 1750 mg/350 mL        1,750 mg 175 mL/hr over 120 Minutes Intravenous  Once 06/16/21 0820 06/16/21 1114   06/16/21 0230  remdesivir 200 mg in sodium chloride 0.9% 250 mL IVPB       See Hyperspace for full Linked Orders Report.   200 mg 580 mL/hr over 30 Minutes Intravenous Once 06/16/21 0135 06/16/21 0324   06/15/21 2230  linezolid (ZYVOX) IVPB 600 mg  Status:  Discontinued        600  mg 300 mL/hr over 60 Minutes Intravenous Every 12 hours 06/15/21 2228 06/16/21 0811   06/15/21 2115  cefTRIAXone (ROCEPHIN) 2 g in sodium chloride 0.9 % 100 mL IVPB        2 g 200 mL/hr over 30 Minutes Intravenous  Once 06/15/21 2114 06/15/21 2328        V. Charlena Cross, M.D., [facility] [place] Office: [phone] Pager:  [phone]

## 2021-06-27 NOTE — Transfer of Care (Signed)
Immediate Anesthesia Transfer of Care Note  Patient: Killian Schwer Preast  Procedure(s) Performed: IRRIGATION AND DEBRIDEMENT OF RIGHT LEG (Right: Leg Lower) APPLICATION OF WOUND VAC (Right: Leg Lower) LIGATION OF POPITEAL ARTERY (Right: Leg Lower) REPAIR OF POPITEAL VEIN (Right: Leg Lower)  Patient Location: PACU  Anesthesia Type:General  Level of Consciousness: awake and alert   Airway & Oxygen Therapy: Patient Spontanous Breathing and Patient connected to face mask oxygen  Post-op Assessment: Report given to RN and Post -op Vital signs reviewed and stable  Post vital signs: Reviewed and stable  Last Vitals:  Vitals Value Taken Time  BP 132/65 06/27/21 1915  Temp    Pulse 77 06/27/21 1921  Resp 19 06/27/21 1921  SpO2 100 % 06/27/21 1921  Vitals shown include unvalidated device data.  Last Pain:  Vitals:   06/27/21 1554  TempSrc: Oral  PainSc:       Patients Stated Pain Goal: 0 (06/25/21 1540)  Complications: No notable events documented.

## 2021-06-27 NOTE — Progress Notes (Signed)
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 Arlyn [name] washout 03/12/2020 Dr. Durwin [name] Recent admission 06/07/21 occluded right femoral pop bypass status post thrombolysis mechanical thrombectomy R femoropopliteal bypass graft and stent closure with Mynx closure and discharge 6/24  Readmit 6/25 generalized weakness confusion-found to have bladder retention 900 cc possible UTI COVID-positive on admission Rx--Decadron remdesivir Started on cefepime for UTI concern Had AKI on admission and was treated for UTI which was unfounded on cultures-patient failed voiding trial and will need outpatient follow-up May need palliative follow-up at skilled facility on discharge  Hospital-Problem based course  Hypovolemic shock 2/2 LE bleed bleeding from area of the right femoropopliteal bypass graft/perforation Wound reassessed by vascular surgeon-7/6 pm and 7/7 am Brisk bleed subsequently ~ 1500 necessitating resuscitation with bolus IVF Get cbc, give 1 u prbc NOW--transfuse to goal above 8--[has 2 U ahead] Lonnie [name], VVS at bedside evaluating next steps---may need OR re-exploration? Defer to VVS Severe PAD and still smoker status post recent admission right femoropopliteal bypass mechanical thrombectomy 6/17 aspirin 81, Plavix 75 and statin as per VVS given bleeding Cautious tramadol 50 every 12 as needed, oxycodone 5 mg every 8 as needed LUTS/BPH Foley catheter placed on admission -outpatient follow-up for urodynamic studies with urologist-continue bethanechol 10 3 times daily,  Flomax 0.8 nightly Subdural hematoma status post craniectomy 06/2019 with residual neurological deficits--occasional confusion  continue Celexa 20 Seroquel 100 twice daily Metabolic encephalopathy on admission-DDx COVID versus BPH causing the same Patient orientable somewhat appropriate-caution with meds in the outpatient setting Careful with pain meds COVID-19 on 6/25 Relatively asymptomatic currently-Rx earlier in hospital stay 3 days remdesivir Decadron ending on 6/29 Leukocytosis/thrombocytosis per ID is secondary to steroids CXR performed 7/6 shows no residual infection or inflammation Elevated D-dimer  2/2 COVID and lower extremity ultrasound was negative hypovolemic hypotonic hyponatremia Urine sodium less than 10 urine  indicating hypotonic state Restrict fluid to 1200 cc, giving saline as above Systolic/diastolic heart failure EF 40-45% diastolic dysfunction noted Compensated-holding home Lasix CABG X4 in 2021 Outpatient cardiology follow-up-continue afebrile 75,  Hold lopressor tonight    DVT prophylaxis: Lovenox Code Status: DNR Family Communication: None present --will call and update this pm Disposition:  Status is: Inpatient  Remains inpatient appropriate because:Hemodynamically unstable, IV treatments appropriate due to intensity of illness or inability to take PO, and Inpatient level of care appropriate due to severity of illness  Dispo:  Patient From: Home  Planned Disposition: Skilled Nursing Facility  Medically stable for discharge: No     Consultants:  vasc  Procedures:   Antimicrobials:     Subjective:  Small vol bleed overnight and this am Large brisk bleed ~ 1500 Vascular At bedside Patient is coherent  "I dont feel good"  Objective: Vitals:   06/26/21 2144 06/26/21 2314 06/27/21 0554 06/27/21 0855  BP: 115/69 (!) 104/59 116/72 131/75  Pulse: 88 79 96 93  Resp:      Temp: 98.6 F (37 C)  98.2 F (36.8 C) 99.4 F (37.4 C)  TempSrc:  Oral  Oral Oral  SpO2: 91% 99% 100% 97%  Weight:  Height:        Intake/Output Summary (Last 24 hours) at 06/27/2021 1228 Last data filed at 06/27/2021 0800 Gross per 24 hour  Intake 300 ml  Output 1800 ml  Net -1500 ml    Filed Weights   06/24/21 2133 06/25/21 2201 06/26/21 0500  Weight: 79.1 kg 79.1 kg 79 kg    Examination:  Coherent to some degree No distress Cta b Abd soft nt nd  Area LE is bleeding some as per picture     Data Reviewed: personally reviewed   CBC    Component Value Date/Time   WBC 20.4 (H) 06/27/2021 0159   RBC 3.36 (L) 06/27/2021 0159   HGB 7.3 (L) 06/27/2021 0159   HGB 15.7 01/10/2020 1146   HCT 23.5 (L) 06/27/2021 0159   HCT 46.1 01/10/2020 1146   PLT 896 (H) 06/27/2021 0159   PLT 232 01/10/2020 1146   MCV 69.9 (L) 06/27/2021 0159   MCV 83 01/10/2020 1146   MCH 21.7 (L) 06/27/2021 0159   MCHC 31.1 06/27/2021 0159   RDW 19.5 (H) 06/27/2021 0159   RDW 12.8 01/10/2020 1146   LYMPHSABS 1.7 06/25/2021 0338   LYMPHSABS 2.5 03/01/2015 1414   MONOABS 1.8 (H) 06/25/2021 0338   EOSABS 0.2 06/25/2021 0338   EOSABS 0.4 03/01/2015 1414   BASOSABS 0.1 06/25/2021 0338   BASOSABS 0.1 03/01/2015 1414   CMP Latest Ref Rng & Units 06/27/2021 06/25/2021 06/24/2021  Glucose 70 - 99 mg/dL 169(C) 789(F) 810(F)  BUN 8 - 23 mg/dL 12 15 17   Creatinine 0.61 - 1.24 mg/dL 7.51 0.25  Sodium 135 - 145 mmol/L 129(L) 128(L) 128(L)  Potassium 3.5 - 5.1 mmol/L 4.4 4.5 4.2  Chloride 98 - 111 mmol/L 99 97(L) 97(L)  CO2 22 - 32 mmol/L 22 23 22   Calcium 8.9 - 10.3 mg/dL 9.1 9.0 8.52)  Total Protein 6.5 - 8.1 g/dL - - -  Total Bilirubin 0.3 - 1.2 mg/dL - - -  Alkaline Phos 38 - 126 U/L - - -  AST 15 - 41 U/L - - -  ALT 0 - 44 U/L - - -     Radiology Studies: DG CHEST PORT 1 VIEW  Result Date: 06/26/2021 CLINICAL DATA:  70 year old male with concern for pneumonia. EXAM: PORTABLE CHEST - 1 VIEW COMPARISON:  06/16/2021 FINDINGS: The mediastinal contours are  within normal limits. No cardiomegaly. Similar appearing postsurgical changes after coronary artery bypass graft. The lungs are clear bilaterally without evidence of focal consolidation, pleural effusion, or pneumothorax. No acute osseous abnormality. IMPRESSION: Significantly improved aeration of the lungs bilaterally. No acute cardiopulmonary process. Electronically Signed   By: 73 MD   On: 06/26/2021 09:12     Scheduled Meds:  aspirin EC  81 mg Oral Daily   bethanechol  10 mg Oral TID   Chlorhexidine Gluconate Cloth  6 each Topical Daily   citalopram  20 mg Oral Daily   clopidogrel  75 mg Oral Daily   enoxaparin (LOVENOX) injection  40 mg Subcutaneous Q24H   ferrous fumarate-b12-vitamic C-folic acid  1 capsule Oral Daily   gabapentin  100 mg Oral BID   melatonin  10 mg Oral QHS   metoprolol tartrate  12.5 mg Oral BID   multivitamin with minerals  1 tablet Oral Daily   nicotine  7 mg Transdermal Daily   pantoprazole  40 mg Oral QPM   QUEtiapine  100 mg Oral BID   senna-docusate  1 tablet Oral  BID   sodium chloride flush  3 mL Intravenous Once   tamsulosin  0.8 mg Oral QPM   Thrombi-Pad  1 each Topical Once   Continuous Infusions:  sodium chloride 75 mL/hr at 06/27/21 1141     LOS: 11 days   Time spent: 45  Rhetta Mura, MD [facility] To contact the attending provider between 7A-7P or the covering provider during after hours 7P-7A, please log into the web site [email or web address] and access using universal [place] password for that web site. If you do not have the password, please call the hospital operator.  06/27/2021, 12:28 PM

## 2021-06-27 NOTE — Progress Notes (Signed)
Patient arrived from PACU; VSS; CHG completed; patient placed on telemetry/CCMD notified.

## 2021-06-27 NOTE — Op Note (Signed)
Date: June 27, 2021  Preoperative diagnosis: Hemorrhage from right below-knee popliteal wound  Postoperative diagnosis: Same  Procedure: 1.  Exploration of right below-knee popliteal wound 2.  Repair of right popliteal vein 3.  Ligation of right popliteal artery 4.  Placement of negative pressure wound VAC (10 cm x 4 cm x 6 cm)  Surgeon: Dr. Cephus Shelling, MD  Assistant: Aggie Moats, PA  Indications: Patient is a 70 year old male who recently underwent thrombolysis of a right leg bypass to the below-knee popliteal artery and ultimately had stents placed across the distal anastomosis.  He was noted to have a bleeding event on the floor last night with known hematoma.  He had a second bleeding event today where he dropped his blood pressure and we elected to take him to the operating room for exploration.  Findings: The distal anastomosis of the below-knee popliteal graft to the native artery was disrupted and the Viabahn stent across the distal anastomosis was completely disrupted.  There was bleeding from the popliteal vein and also backbleeding from the distal popliteal artery.  Popliteal vein was primarily repaired.  The popliteal artery was oversewn and ligated on each end with 5-0 Prolene.  I suspect he has an underlying infection as cause for the graft to become completely disrupted.  Anesthesia: General  Details: Patient was taken to the operating room after informed consent was obtained.  Placed on the operating table in supine position.  General endotracheal anesthesia was induced.  Timeout was performed.  I initially opened the wound in the right below-knee popliteal medially and extended it along the previous incision margins.  I evacuated a large amount of hematoma here and it became apparent that there was some more acute bleeding.  We used Meyerding retractors for added visualization.  Upon further exploration it was apparent that the graft had become completely disrupted  from the native below-knee popliteal artery.  There appeared to be some venous bleeding from adjacent popliteal vein and also backbleeding from the distal popliteal artery.  Ultimately I completely disconnected the graft and this was cut as high in the wound as I could and then the proximal stump of the graft was oversewn with a 2-0 silk.  The popliteal vein was then primarily repaired with multiple 5-0 Prolene repair sutures.  I elected the ligate both the proximal and distal stump of the popliteal artery given the tissue was denuded and friable with 5-0 Prolene.  The wound was copiously irrigated and wound VAC was placed.  Complication: None  Condition: Stable  Cephus Shelling, MD [facility] [place] Office: [phone]   Cephus Shelling

## 2021-06-27 NOTE — Progress Notes (Signed)
70 year old male that had evacuation of a hematoma from his right below-knee popliteal incision from previous bypass graft placement last night.  Had another bleeding event on the floor this afternoon and reportedly dropped his pressures into the 70s systolic.  Significant amount of hematoma was evacuated from the wound.  Unclear if this could be old hematoma from his thrombolysis versus disruption of the graft given concern for graft exposure and underlying infection.  I recommended proceeding to the OR for washout exploration and possible VAC placement.  I have updated his daughter by phone.  He seems to have good understanding.  Cephus Shelling, MD [facility] [place] Office: [phone]   Cephus Shelling

## 2021-06-27 NOTE — Significant Event (Signed)
Rapid Response Event Note   Reason for Call :  Bleeding from RLE wound  Initial Focused Assessment:  Significant amount of blood loss noted from RLE saturating lower bed linen and puddle of blood underneath bed. Packing remains intact and blood is now a slow trickle (non-pulsatile) from site. Pt endorses pain to the site and to the back of his right thigh. He also endorses numbness to bilateral feet. Manual BP 70/50, pt endorses his stomach hurting and requesting to have a bowel movement.   VS: T 98.47F, BP 70/50, HR 114, RR 18, SpO2 100% on 2LNC  Interventions:  -1L IVF bolus -CBC -1U PRBC now -2nd IV site established  Plan of Care:  -Plan for OR then transfer PCU -Maintain two working IV sites  Call rapid response for additional needs  Event Summary:  MD Notified: Dr. Mahala Menghini Call Time: 1452 Arrival Time: 1455 End Time: 1645  Jennye Moccasin, RN

## 2021-06-27 NOTE — TOC Progression Note (Addendum)
Transition of Care [facility]) - Progression Note    Patient Details  Name: Lonnie [name] MRN: [id number] Date of Birth: April 12, 1951  [facility] CM/SW Contact  Okey Dupre Lazaro Arms, LCSW Phone Number: 06/27/2021, 4:04 PM  Clinical Narrative:  CSW learned from attending MD that patient not medically ready for discharge today and may transfer to another unit. Wife called and is aware that her husband has had a set-back. She was informed that someone would be contacting family regarding her husband. Talked briefly with Dr. Mahala Menghini regarding my conversation with wife.  Call made to [facility], admissions director at [facility] and informed her that patient is not medically stable for discharge and may be moved to another unit. Olegario Messier advised that unit CSW will be in contact with her.  **Patient vaccinated for COVID: Pfizer 02/16/20, 03/13/20, 11/04/20 and 06/25/21. Copy of latest vaccination (7/5) card in patient's chart.      Barriers to Discharge: Continued Medical Work up  Expected Discharge Plan and Services                                               Social Determinants of Health (SDOH) Interventions  No SDOH interventions requested or needed at this time  Readmission Risk Interventions Readmission Risk Prevention Plan 06/14/2021 09/12/2020 03/12/2020  Post Dischage Appt Complete - -  Medication Screening Complete - -  Transportation Screening Complete Complete -  PCP or Specialist Appt within 3-5 Days - Complete -  HRI or Home Care Consult - Complete -  Social Work Consult for Recovery Care Planning/Counseling - Complete -  Palliative Care Screening - Not Applicable -  Medication Review Oceanographer) - Complete Complete  Some recent data might be hidden

## 2021-06-27 NOTE — Anesthesia Procedure Notes (Signed)
Procedure Name: Intubation Date/Time: 06/27/2021 5:29 PM Performed by: Eligha Bridegroom, CRNA Pre-anesthesia Checklist: Emergency Drugs available, Patient identified, Suction available, Patient being monitored and Timeout performed Patient Re-evaluated:Patient Re-evaluated prior to induction Oxygen Delivery Method: Circle system utilized Induction Type: IV induction and Rapid sequence Laryngoscope Size: Mac and 4 Grade View: Grade I Tube type: Oral Tube size: 7.5 mm Number of attempts: 1 Airway Equipment and Method: Stylet Placement Confirmation: ETT inserted through vocal cords under direct vision, positive ETCO2 and breath sounds checked- equal and bilateral Secured at: 22 cm Tube secured with: Tape Dental Injury: Teeth and Oropharynx as per pre-operative assessment

## 2021-06-27 NOTE — Anesthesia Postprocedure Evaluation (Signed)
Anesthesia Post Note  Patient: Lonnie [name]  Procedure(s) Performed: IRRIGATION AND DEBRIDEMENT OF RIGHT LEG (Right: Leg Lower) APPLICATION OF WOUND VAC (Right: Leg Lower) LIGATION OF POPITEAL ARTERY (Right: Leg Lower) REPAIR OF POPITEAL VEIN (Right: Leg Lower)     Patient location during evaluation: PACU Anesthesia Type: General Level of consciousness: awake and alert Pain management: pain level controlled Vital Signs Assessment: post-procedure vital signs reviewed and stable Respiratory status: spontaneous breathing, nonlabored ventilation, respiratory function stable and patient connected to nasal cannula oxygen Cardiovascular status: blood pressure returned to baseline and stable Postop Assessment: no apparent nausea or vomiting Anesthetic complications: no   No notable events documented.  Last Vitals:  Vitals:   06/27/21 1945 06/27/21 2023  BP: 126/60 139/79  Pulse: 76 74  Resp: 14 16  Temp: 36.4 C 36.7 C  SpO2: 100% 100%    Last Pain:  Vitals:   06/27/21 2023  TempSrc: Oral  PainSc:                  Kennieth Rad

## 2021-06-27 NOTE — Anesthesia Procedure Notes (Signed)
Procedure Name: Intubation Date/Time: 06/27/2021 5:29 PM Performed by: Gwenyth Allegra, CRNA Pre-anesthesia Checklist: Patient identified, Emergency Drugs available, Suction available, Patient being monitored and Timeout performed Patient Re-evaluated:Patient Re-evaluated prior to induction Oxygen Delivery Method: Circle system utilized Preoxygenation: Pre-oxygenation with 100% oxygen Induction Type: IV induction and Rapid sequence Grade View: Grade I Tube type: Oral Tube size: 7.5 mm Number of attempts: 1 Airway Equipment and Method: Stylet Placement Confirmation: ETT inserted through vocal cords under direct vision, positive ETCO2 and breath sounds checked- equal and bilateral Secured at: 22 cm Tube secured with: Tape Dental Injury: Teeth and Oropharynx as per pre-operative assessment

## 2021-06-27 NOTE — Anesthesia Preprocedure Evaluation (Signed)
Anesthesia Evaluation  Patient identified by MRN, date of birth, ID band Patient awake    Reviewed: Allergy & Precautions, NPO status , Patient's Chart, lab work & pertinent test results  Airway Mallampati: II  TM Distance: >3 FB Neck ROM: Full    Dental no notable dental hx.    Pulmonary neg pulmonary ROS, Current Smoker and Patient abstained from smoking.,    Pulmonary exam normal breath sounds clear to auscultation       Cardiovascular hypertension, + CAD, + Past MI, + CABG and + Peripheral Vascular Disease   Rhythm:Regular Rate:Tachycardia     Neuro/Psych negative neurological ROS  negative psych ROS   GI/Hepatic negative GI ROS, Neg liver ROS,   Endo/Other  negative endocrine ROS  Renal/GU negative Renal ROS  negative genitourinary   Musculoskeletal negative musculoskeletal ROS (+)   Abdominal   Peds negative pediatric ROS (+)  Hematology  (+) anemia , hgB 6.0   Anesthesia Other Findings   Reproductive/Obstetrics negative OB ROS                             Anesthesia Physical Anesthesia Plan  ASA: 4  Anesthesia Plan: General   Post-op Pain Management:    Induction: Intravenous  PONV Risk Score and Plan: 1 and Dexamethasone, Ondansetron and Treatment may vary due to age or medical condition  Airway Management Planned: LMA  Additional Equipment:   Intra-op Plan:   Post-operative Plan: Extubation in OR  Informed Consent: I have reviewed the patients History and Physical, chart, labs and discussed the procedure including the risks, benefits and alternatives for the proposed anesthesia with the patient or authorized representative who has indicated his/her understanding and acceptance.     Dental advisory given  Plan Discussed with: CRNA and Surgeon  Anesthesia Plan Comments:         Anesthesia Quick Evaluation

## 2021-06-28 ENCOUNTER — Encounter (HOSPITAL_COMMUNITY): Payer: Self-pay | Admitting: Vascular Surgery

## 2021-06-28 DIAGNOSIS — R4182 Altered mental status, unspecified: Secondary | ICD-10-CM | POA: Diagnosis not present

## 2021-06-28 LAB — CBC WITH DIFFERENTIAL/PLATELET
Abs Immature Granulocytes: 0.59 10*3/uL — ABNORMAL HIGH (ref 0.00–0.07)
Basophils Absolute: 0.1 10*3/uL (ref 0.0–0.1)
Basophils Relative: 1 %
Eosinophils Absolute: 0.2 10*3/uL (ref 0.0–0.5)
Eosinophils Relative: 1 %
HCT: 27.9 % — ABNORMAL LOW (ref 39.0–52.0)
Hemoglobin: 9.4 g/dL — ABNORMAL LOW (ref 13.0–17.0)
Immature Granulocytes: 3 %
Lymphocytes Relative: 11 %
Lymphs Abs: 2.2 10*3/uL (ref 0.7–4.0)
MCH: 26.3 pg (ref 26.0–34.0)
MCHC: 33.7 g/dL (ref 30.0–36.0)
MCV: 78.2 fL — ABNORMAL LOW (ref 80.0–100.0)
Monocytes Absolute: 1.6 10*3/uL — ABNORMAL HIGH (ref 0.1–1.0)
Monocytes Relative: 8 %
Neutro Abs: 15.4 10*3/uL — ABNORMAL HIGH (ref 1.7–7.7)
Neutrophils Relative %: 76 %
Platelets: 565 10*3/uL — ABNORMAL HIGH (ref 150–400)
RBC: 3.57 MIL/uL — ABNORMAL LOW (ref 4.22–5.81)
RDW: 21 % — ABNORMAL HIGH (ref 11.5–15.5)
WBC: 20 10*3/uL — ABNORMAL HIGH (ref 4.0–10.5)
nRBC: 0 % (ref 0.0–0.2)

## 2021-06-28 MED ORDER — OXYCODONE HCL 5 MG PO TABS
5.0000 mg | ORAL_TABLET | Freq: Once | ORAL | Status: AC
  Administered 2021-06-28: 5 mg via ORAL
  Filled 2021-06-28: qty 1

## 2021-06-28 NOTE — Progress Notes (Signed)
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 Arlyn Dunning washout 03/12/2020 Dr. Durwin Nora Recent admission 06/07/21 occluded right femoral pop bypass status post thrombolysis mechanical thrombectomy R femoropopliteal bypass graft and stent closure with Mynx closure and discharge 6/24  Readmit 6/25 generalized weakness confusion-found to have bladder retention 900 cc possible UTI COVID-positive on admission Rx--Decadron remdesivir Started on cefepime for UTI-UTI which was unfounded on cultures- Had AKI on admission- patient failed voiding trial  Developed spontaneous bleeding from prior area of right femoropopliteal bypass 06/26/2021 and then large-volume bleeding with hypovolemic shock 06/27/2021 Taken to the OR by Dr. Chestine Spore of vascular surgery 7/7 PM  Hospital-Problem based course  Hypovolemic shock 2/2 LE bleed bleeding from area of the right femoropopliteal bypass graft/perforation Status post surgery/ligation 06/27/2021 Deferred next steps and discussion with family regarding right lower extremity to vascular surgeon 3 units PRBC transfused 06/27/2021-hemoglobin stabilized Leukocytosis retrospectively could have been infected graft?  Antibiotics per VS Severe PAD and still smoker status post recent admission right femoropopliteal bypass mechanical thrombectomy 6/17 aspirin 81, Plavix 75 and statin as per VVS given bleeding tramadol 50 every 12 as needed, oxycodone 5 mg every 8 as needed LUTS/BPH Foley catheter placed on admission -outpatient urodynamic studies with urologist-continue bethanechol 10 3 times daily, Flomax 0.8  nightly Subdural hematoma status post craniectomy 06/2019 with residual neurological deficits--occasional confusion  continue Celexa 20 Seroquel 100 twice daily Metabolic encephalopathy on admission-DDx COVID versus BPH causing the same Patient orientable somewhat appropriate-caution with meds in the outpatient setting Careful with pain meds COVID-19 on 6/25 Relatively asymptomatic currently-Rx earlier in hospital stay 3 days remdesivir Decadron ending on 6/29 CXR performed 7/6 shows no residual infection or inflammation Elevated D-dimer  2/2 COVID and lower extremity ultrasound was negative hypovolemic hypotonic hyponatremia Urine sodium less than 10 urine  indicating hypotonic state Sodium improved-125 cc/H Systolic/diastolic heart failure EF 40-45% diastolic dysfunction noted Compensated-holding home Lasix CABG X4 in 2021 Outpatient cardiology follow-up-continue afebrile 75,  Hold lopressor at this time-possibly resume in the next 24 to 48 hours    DVT prophylaxis: Lovenox Code Status: DNR Family Communication: Discussed yesterday with family on the phone with regards to events occurring 06/27/2021 and bleeding-vascular surgery has been in touch with them and will continue to coordinate management of the right lower extremity and planning regarding the same Disposition:  Status is: Inpatient  Remains inpatient appropriate because:Hemodynamically unstable, IV treatments appropriate due to intensity of illness or inability to take PO, and Inpatient level of care appropriate due to severity of illness  Dispo:  Patient From: Home  Planned Disposition: Skilled Nursing Facility  Medically stable for discharge: No     Consultants:  vasc  Procedures:   Antimicrobials:     Subjective:  RN note reviewed--patient has been stable overnight with some pain that he rates as 6-7/10 in his right leg He is not very coherent at this time-Awake mildly confused but does remember my name-asks  if he is coming back tomorrow asks about therapy but then ultimately wishes not to have therapy He denies chest pain  Objective: Vitals:   06/27/21 2023 06/28/21 0042 06/28/21 0454 06/28/21 0500  BP: 139/79 129/60 Marland Kitchen)  142/67   Pulse: 74 84 77   Resp: 16 15 18    Temp: 98.1 F (36.7 C) 98.3 F (36.8 C) 98.9 F (37.2 C)   TempSrc: Oral Oral Oral   SpO2: 100% 99% 99%   Weight: 78.1 kg   80.7 kg  Height: 6\' 2"  (1.88 m)       Intake/Output Summary (Last 24 hours) at 06/28/2021 0720 Last data filed at 06/28/2021 0550 Gross per 24 hour  Intake 2471.99 ml  Output 1625 ml  Net 846.99 ml    Filed Weights   06/26/21 0500 06/27/21 2023 06/28/21 0500  Weight: 79 kg 78.1 kg 80.7 kg    Examination:  Neck soft supple chest clear Abdomen soft no rebound no guarding ROM intact no focal deficit although pain limits right leg movement-he has a VAC in place on the right leg draining minimal amount S1-S2 PVCs on monitors sinus rhythm predominantly Moderate confusion noted    Data Reviewed: personally reviewed   CBC    Component Value Date/Time   WBC 19.8 (H) 06/27/2021 1522   RBC 2.69 (L) 06/27/2021 1522   HGB 8.2 (L) 06/27/2021 1832   HGB 15.7 01/10/2020 1146   HCT 24.0 (L) 06/27/2021 1832   HCT 46.1 01/10/2020 1146   PLT 829 (H) 06/27/2021 1522   PLT 232 01/10/2020 1146   MCV 72.5 (L) 06/27/2021 1522   MCV 83 01/10/2020 1146   MCH 21.9 (L) 06/27/2021 1522   MCHC 30.3 06/27/2021 1522   RDW 19.6 (H) 06/27/2021 1522   RDW 12.8 01/10/2020 1146   LYMPHSABS 1.7 06/25/2021 0338   LYMPHSABS 2.5 03/01/2015 1414   MONOABS 1.8 (H) 06/25/2021 0338   EOSABS 0.2 06/25/2021 0338   EOSABS 0.4 03/01/2015 1414   BASOSABS 0.1 06/25/2021 0338   BASOSABS 0.1 03/01/2015 1414   CMP Latest Ref Rng & Units 06/27/2021 06/27/2021 06/25/2021  Glucose 70 - 99 mg/dL 08/28/2021) 08/26/2021) 440(H)  BUN 8 - 23 mg/dL 13 12 15   Creatinine 0.61 - 1.24 mg/dL 474(Q 595(G  Sodium 135 - 145 mmol/L 133(L) 129(L) 128(L)   Potassium 3.5 - 5.1 mmol/L 4.9 4.4 4.5  Chloride 98 - 111 mmol/L 101 99 97(L)  CO2 22 - 32 mmol/L - 22 23  Calcium 8.9 - 10.3 mg/dL - 9.1 9.0  Total Protein 6.5 - 8.1 g/dL - - -  Total Bilirubin 0.3 - 1.2 mg/dL - - -  Alkaline Phos 38 - 126 U/L - - -  AST 15 - 41 U/L - - -  ALT 0 - 44 U/L - - -     Radiology Studies: DG CHEST PORT 1 VIEW  Result Date: 06/26/2021 CLINICAL DATA:  70 year old male with concern for pneumonia. EXAM: PORTABLE CHEST - 1 VIEW COMPARISON:  06/16/2021 FINDINGS: The mediastinal contours are within normal limits. No cardiomegaly. Similar appearing postsurgical changes after coronary artery bypass graft. The lungs are clear bilaterally without evidence of focal consolidation, pleural effusion, or pneumothorax. No acute osseous abnormality. IMPRESSION: Significantly improved aeration of the lungs bilaterally. No acute cardiopulmonary process. Electronically Signed   By: 08/27/2021 MD   On: 06/26/2021 09:12     Scheduled Meds:  sodium chloride   Intravenous Once   acetaminophen  650 mg Oral Once   aspirin EC  81 mg Oral Daily   bethanechol  10 mg Oral TID   Chlorhexidine Gluconate Cloth  6 each Topical Daily   citalopram  20 mg Oral Daily   clopidogrel  75 mg Oral Daily   diphenhydrAMINE  25 mg Oral Once   enoxaparin (LOVENOX) injection  40 mg Subcutaneous Q24H   ferrous fumarate-b12-vitamic C-folic acid  1 capsule Oral Daily   furosemide  20 mg Intravenous Once   gabapentin  100 mg Oral BID   melatonin  10 mg Oral QHS   multivitamin with minerals  1 tablet Oral Daily   nicotine  7 mg Transdermal Daily   pantoprazole  40 mg Oral QPM   QUEtiapine  100 mg Oral BID   senna-docusate  1 tablet Oral BID   sodium chloride flush  3 mL Intravenous Once   tamsulosin  0.8 mg Oral QPM   Thrombi-Pad  1 each Topical Once   Continuous Infusions:  sodium chloride 400 mL/hr at 06/27/21 1904   sodium chloride Stopped (06/28/21 0553)     LOS: 12 days   Time spent:  61  Rhetta Mura, MD [facility] To contact the attending provider between 7A-7P or the covering provider during after hours 7P-7A, please log into the web site [email or web address] and access using universal  password for that web site. If you do not have the password, please call the hospital operator.  06/28/2021, 7:20 AM

## 2021-06-28 NOTE — Progress Notes (Addendum)
  Progress Note    06/28/2021 8:09 AM 1 Day Post-Op  Subjective:  Pain in R foot   Vitals:   06/28/21 0042 06/28/21 0454  BP: 129/60 (!) 142/67  Pulse: 84 77  Resp: 15 18  Temp: 98.3 F (36.8 C) 98.9 F (37.2 C)  SpO2: 99% 99%   Physical Exam: Lungs:  non labored Incisions:  R popliteal incision with vac in place Extremities:  R foot cool to touch Neurologic: A&O  CBC    Component Value Date/Time   WBC 20.0 (H) 06/28/2021 0632   RBC 3.57 (L) 06/28/2021 0632   HGB 9.4 (L) 06/28/2021 0632   HGB 15.7 01/10/2020 1146   HCT 27.9 (L) 06/28/2021 0632   HCT 46.1 01/10/2020 1146   PLT 565 (H) 06/28/2021 0632   PLT 232 01/10/2020 1146   MCV 78.2 (L) 06/28/2021 0632   MCV 83 01/10/2020 1146   MCH 26.3 06/28/2021 0632   MCHC 33.7 06/28/2021 0632   RDW 21.0 (H) 06/28/2021 0632   RDW 12.8 01/10/2020 1146   LYMPHSABS 2.2 06/28/2021 0632   LYMPHSABS 2.5 03/01/2015 1414   MONOABS 1.6 (H) 06/28/2021 0632   EOSABS 0.2 06/28/2021 0632   EOSABS 0.4 03/01/2015 1414   BASOSABS 0.1 06/28/2021 0632   BASOSABS 0.1 03/01/2015 1414    BMET    Component Value Date/Time   NA 133 (L) 06/27/2021 1832   NA 142 01/10/2020 1146   K 4.9 06/27/2021 1832   CL 101 06/27/2021 1832   CO2 22 06/27/2021 0159   GLUCOSE 164 (H) 06/27/2021 1832   BUN 13 06/27/2021 1832   BUN 19 01/10/2020 1146   CREATININE 0.90 06/27/2021 1832   CREATININE 0.94 05/24/2021 1549   CALCIUM 9.1 06/27/2021 0159   GFRNONAA >60 06/27/2021 0159   GFRAA >60 09/12/2020 0357    INR    Component Value Date/Time   INR 1.1 06/27/2021 0159     Intake/Output Summary (Last 24 hours) at 06/28/2021 0809 Last data filed at 06/28/2021 0550 Gross per 24 hour  Intake 2411.99 ml  Output 1625 ml  Net 786.99 ml     Assessment/Plan:  70 y.o. male is s/p I & D of R popliteal space and ligation of bypass graft 1 Day Post-Op   Continue wound vac R popliteal space No dopplerable flow into R foot Dr. Myra Gianotti will discuss  possible amputation with patient and family   Emilie Rutter, PA-C [facility] [phone] 06/28/2021 8:09 AM   I discussed the details of the operation yesterday with the patient.  I told him that he is not a candidate for further revascularization.  Since he has a wound on his foot, I doubt this will heal and I think he is going to end up needing an above-knee amputation prior to discharge.  He is trying to process this but understands.  He has been thinking about it since I mentioned the possibility of an amputation several days ago.  He does not have acute rest pain, however I suspect given the wound on his foot he will need surgery in the near future.  I updated his daughter via phone.  They understand and areOK with proceeding  Wells Naliyah Neth

## 2021-06-28 NOTE — Plan of Care (Signed)
  Problem: Activity: Goal: Risk for activity intolerance will decrease Outcome: Not Progressing   

## 2021-06-28 NOTE — Progress Notes (Signed)
Occupational Therapy Treatment Patient Details Name: Lonnie [name] MRN: [id number] DOB: [birth year] Today's Date: 06/28/2021    History of present illness Lonnie [name] is a 70 y.o. male who underwent peripheral vascular thrombectomy of R LE 6/17 and Was discharged to SNF.  Pt readmitted 6/25 due to AMS, AKI, metabolic encephalopathy.  He was found to be COVID +.  He underwent exploration of Rt below knee popliteal wound and noted with  acute ischemic wounds and gangrene Rt foot.  He is not a candidate for further revascularization Medical history significant for dementia, peripheral artery disease, tobacco use disorder, chronic depression/anxiety, iron deficiency anemia, peripheral neuropathy, essential hypertension, hyperlipidemia, GERD, BPH, falls, SDH.   OT comments  Pt able to perform bed mobility with supervision and sit EOB with supervision.  He stood x 2 with min A +2 while keeping Rt LE NWB.  Activity however, limited by severe pain Rt LE.  Pt requires increased time to complete all tasks.    Follow Up Recommendations  SNF;Supervision/Assistance - 24 hour    Equipment Recommendations  3 in 1 bedside commode;Wheelchair (measurements OT);Wheelchair cushion (measurements OT)    Recommendations for Other Services      Precautions / Restrictions Precautions Precautions: Fall Precaution Comments: painful R foot/calf, impaired vision Restrictions Weight Bearing Restrictions: No       Mobility Bed Mobility Overal bed mobility: Needs Assistance Bed Mobility: Supine to Sit;Sit to Supine     Supine to sit: Supervision;HOB elevated Sit to supine: Supervision;HOB elevated   General bed mobility comments: supervision for safety, increased time    Transfers Overall transfer level: Needs assistance Equipment used: Rolling walker (2 wheeled) Transfers: Lateral/Scoot Transfers Sit to Stand: Min assist;+2 physical assistance;+2 safety/equipment        Lateral/Scoot  Transfers: Min guard General transfer comment: Pt limited by pain    Balance Overall balance assessment: Needs assistance Sitting-balance support: No upper extremity supported;Feet supported Sitting balance-Leahy Scale: Good Sitting balance - Comments: scooting no LOB   Standing balance support: Bilateral upper extremity supported;Single extremity supported;During functional activity Standing balance-Leahy Scale: Poor Standing balance comment: requires UE support, vc for upright posture                           ADL either performed or assessed with clinical judgement   ADL                           Toilet Transfer: Moderate assistance;Stand-pivot;BSC;RW Toilet Transfer Details (indicate cue type and reason): limited by pain                 Vision       Perception     Praxis      Cognition Arousal/Alertness: Awake/alert Behavior During Therapy: WFL for tasks assessed/performed Overall Cognitive Status: No family/caregiver present to determine baseline cognitive functioning Area of Impairment: Attention;Memory;Problem solving;Awareness;Following commands;Safety/judgement                 Orientation Level:  (not formally assessed this visit) Current Attention Level: Sustained Memory: Decreased short-term memory Following Commands: Follows one step commands with increased time;Follows multi-step commands inconsistently;Follows multi-step commands with increased time Safety/Judgement: Decreased awareness of safety;Decreased awareness of deficits Awareness: Intellectual Problem Solving: Slow processing;Decreased initiation;Requires verbal cues;Difficulty sequencing;Requires tactile cues General Comments: pt very motivated.  He does have a history of dementia.        Exercises Exercises:  General Lower Extremity General Exercises - Lower Extremity Ankle Circles/Pumps: AROM;Both;10 reps;Supine Short Arc Quad: AROM;Right;10 reps;Supine Long  Arc Quad: AROM;Left;10 reps;Seated Heel Slides: AAROM;Right;10 reps;Supine Hip ABduction/ADduction: AAROM;Right;10 reps;Supine Straight Leg Raises: Right;10 reps;Supine;AROM Hip Flexion/Marching: AROM;Left;10 reps;Seated   Shoulder Instructions       General Comments VSS on RA    Pertinent Vitals/ Pain       Pain Assessment: 0-10 Pain Score: 8  Faces Pain Scale: Hurts whole lot Pain Location: R foot with weightbearing Pain Descriptors / Indicators: Grimacing;Discomfort;Tender;Guarding Pain Intervention(s): Monitored during session;Limited activity within patient's tolerance;Repositioned;Premedicated before session  Home Living                                          Prior Functioning/Environment              Frequency  Min 2X/week        Progress Toward Goals  OT Goals(current goals can now be found in the care plan section)  Progress towards OT goals: Progressing toward goals  Acute Rehab OT Goals Patient Stated Goal: go home  Plan Discharge plan remains appropriate    Co-evaluation                 AM-PAC OT "6 Clicks" Daily Activity     Outcome Measure   Help from another person eating meals?: None Help from another person taking care of personal grooming?: A Little Help from another person toileting, which includes using toliet, bedpan, or urinal?: A Lot Help from another person bathing (including washing, rinsing, drying)?: A Lot Help from another person to put on and taking off regular upper body clothing?: A Lot Help from another person to put on and taking off regular lower body clothing?: A Lot 6 Click Score: 15    End of Session Equipment Utilized During Treatment: Rolling walker  OT Visit Diagnosis: Unsteadiness on feet (R26.81);Other abnormalities of gait and mobility (R26.89);Muscle weakness (generalized) (M62.81);Pain;Other symptoms and signs involving cognitive function Pain - Right/Left: Right Pain - part of  body: Leg   Activity Tolerance Patient limited by pain   Patient Left in bed;with call bell/phone within reach;with bed alarm set   Nurse Communication Mobility status;Patient requests pain meds        Time: 5643-3295 OT Time Calculation (min): 26 min  Charges: OT General Charges $OT Visit: 1 Visit OT Treatments $Therapeutic Activity: 23-37 mins  Eber Jones., OTR/L Acute Rehabilitation Services Pager [phone] Office [phone]    Jeani Hawking M 06/28/2021, 4:58 PM

## 2021-06-28 NOTE — Consult Note (Signed)
   [facility] CM Inpatient Consult   06/28/2021  Lonnie [name] 09-24-1951 [id number]  Follow up:  [facility] Care Management review for length of stay to assess transition of care follow up needs.  Electronic medical record reviewed for barriers to transition to skilled nursing facility with review of MD and inpatient [facility] team notes.  Patient transitioned to 4E post op note review noted.  Plan:  Current disposition noted to be unchanged for a skilled nursing level of care for post hospital transition.  Will continue to follow.  For questions, please contact:  Charlesetta Shanks, RN BSN CCM [facility]  [phone] business mobile phone Toll free office [phone]  Fax number: [phone] [email or web address]

## 2021-06-28 NOTE — Progress Notes (Signed)
Physical Therapy Treatment Patient Details Name: Lonnie [name] MRN: [id number] DOB: [birth year] Today's Date: 06/28/2021    History of Present Illness Lonnie [name] is a 70 y.o. male with progressive R foot/calf pain. Duplex revealed occlusion of right femoral to BK popliteal bypass. Acute ischemic skin changes of dorsum of right foot. Pt underwent peripheral vascular thrombectomy of R LE.    PT Comments    Pt received in supine, pleasantly agreeable to therapy session with good participation in seated scooting transfer training and supine/seated LE exercises. Pt limited due to RLE pain with pressure on floor and per chart review possible plan for amputation, therefore kept RLE NWB during seated scooting at EOB in anticipation. Pt R knee unable to achieve TKE, ~20 degrees flexion when attempting extension, encouraged heel slides and quad sets but pt will need reinforcement 2/2 cognition. Pt continues to benefit from PT services to progress toward functional mobility goals.    Follow Up Recommendations  SNF;Supervision/Assistance - 24 hour     Equipment Recommendations  None recommended by PT;Other (comment)    Recommendations for Other Services OT consult     Precautions / Restrictions Precautions Precautions: Fall Precaution Comments: painful R foot/calf, impaired vision Restrictions Weight Bearing Restrictions: No    Mobility  Bed Mobility Overal bed mobility: Needs Assistance Bed Mobility: Supine to Sit;Sit to Supine     Supine to sit: Supervision;HOB elevated Sit to supine: Supervision;HOB elevated   General bed mobility comments: supervision for safety, increased time    Transfers Overall transfer level: Needs assistance   Transfers: Lateral/Scoot Transfers          Lateral/Scoot Transfers: Min guard General transfer comment: scooting to foot of bed/HOB along side of bed, pt with good use of UE but when RLE touches floor, pt crying out in pain so PTA  assisted with min guard at most pt to keep RLE from contacing floor ; fairly steady and good BUE strength to scoot; deferred OOB transfer due to pt c/o severe RLE pain  Ambulation/Gait             General Gait Details: pt unable to tolerate   Stairs             Wheelchair Mobility    Modified Rankin (Stroke Patients Only)       Balance Overall balance assessment: Needs assistance Sitting-balance support: No upper extremity supported;Feet supported Sitting balance-Leahy Scale: Good Sitting balance - Comments: scooting no LOB                                    Cognition Arousal/Alertness: Awake/alert Behavior During Therapy: Impulsive Overall Cognitive Status: No family/caregiver present to determine baseline cognitive functioning Area of Impairment: Attention;Memory;Problem solving;Awareness;Following commands;Safety/judgement                 Orientation Level:  (not formally assessed this visit) Current Attention Level: Sustained Memory: Decreased short-term memory Following Commands: Follows one step commands with increased time;Follows multi-step commands inconsistently;Follows multi-step commands with increased time Safety/Judgement: Decreased awareness of safety;Decreased awareness of deficits Awareness: Intellectual Problem Solving: Slow processing;Decreased initiation;Requires verbal cues;Difficulty sequencing;Requires tactile cues General Comments: pt cooperative and motivated/positive attitude this date      Exercises General Exercises - Lower Extremity Ankle Circles/Pumps: AROM;Both;10 reps;Supine Short Arc Quad: AROM;Right;10 reps;Supine Long Arc Quad: AROM;Left;10 reps;Seated Heel Slides: AAROM;Right;10 reps;Supine Hip ABduction/ADduction: AAROM;Right;10 reps;Supine Straight Leg Raises: Right;10 reps;Supine;AROM Hip  Flexion/Marching: AROM;Left;10 reps;Seated    General Comments General comments (skin integrity, edema,  etc.): VSS on RA      Pertinent Vitals/Pain Pain Assessment: Faces Faces Pain Scale: Hurts whole lot Pain Location: R foot with weightbearing Pain Descriptors / Indicators: Grimacing;Discomfort;Tender;Guarding Pain Intervention(s): Limited activity within patient's tolerance;Monitored during session;Repositioned;Patient requesting pain meds-RN notified    Home Living                      Prior Function            PT Goals (current goals can now be found in the care plan section) Acute Rehab PT Goals Patient Stated Goal: go home PT Goal Formulation: With patient Time For Goal Achievement: 07/01/21 Potential to Achieve Goals: Fair Progress towards PT goals: Progressing toward goals    Frequency    Min 3X/week      PT Plan Current plan remains appropriate    Co-evaluation              AM-PAC PT "6 Clicks" Mobility   Outcome Measure  Help needed turning from your back to your side while in a flat bed without using bedrails?: None Help needed moving from lying on your back to sitting on the side of a flat bed without using bedrails?: A Little Help needed moving to and from a bed to a chair (including a wheelchair)?: A Lot Help needed standing up from a chair using your arms (e.g., wheelchair or bedside chair)?: A Lot Help needed to walk in hospital room?: Total Help needed climbing 3-5 steps with a railing? : Total 6 Click Score: 13    End of Session   Activity Tolerance: Patient limited by pain;Patient tolerated treatment well Patient left: with call bell/phone within reach;Other (comment);in bed;with bed alarm set (heels floated, SCD on LLE only, HOB elevated >40 deg) Nurse Communication: Mobility status;Patient requests pain meds PT Visit Diagnosis: Other abnormalities of gait and mobility (R26.89);Pain;Difficulty in walking, not elsewhere classified (R26.2) Pain - Right/Left: Right Pain - part of body: Leg     Time: 5102-5852 PT Time  Calculation (min) (ACUTE ONLY): 24 min  Charges:  $Therapeutic Exercise: 8-22 mins $Therapeutic Activity: 8-22 mins                     Orel Cooler P., PTA Acute Rehabilitation Services Pager: [phone] Office: [phone]    Angus Palms 06/28/2021, 4:42 PM

## 2021-06-28 NOTE — Progress Notes (Signed)
Patient continues to c/o the dressing on his right foot. Wanted this RN to take the dressing off. Explained to patient that his foot was not as cold as it was when he got to the unit. Told him that is what he most likely was feeling was the blood attempting to return to his foot. Patient understood at that time.   Then the patient called out again asking for the dressing to be taken off. The dressing is still in place. Patient was given pain med to help with the discomfort and pain. Will continue to monitor

## 2021-06-29 DIAGNOSIS — R4182 Altered mental status, unspecified: Secondary | ICD-10-CM | POA: Diagnosis not present

## 2021-06-29 LAB — COMPREHENSIVE METABOLIC PANEL
ALT: 138 U/L — ABNORMAL HIGH (ref 0–44)
AST: 121 U/L — ABNORMAL HIGH (ref 15–41)
Albumin: 2.3 g/dL — ABNORMAL LOW (ref 3.5–5.0)
Alkaline Phosphatase: 136 U/L — ABNORMAL HIGH (ref 38–126)
Anion gap: 7 (ref 5–15)
BUN: 12 mg/dL (ref 8–23)
CO2: 24 mmol/L (ref 22–32)
Calcium: 8.4 mg/dL — ABNORMAL LOW (ref 8.9–10.3)
Chloride: 103 mmol/L (ref 98–111)
Creatinine, Ser: 0.71 mg/dL (ref 0.61–1.24)
GFR, Estimated: >60 mL/min (ref >60)
Glucose, Bld: 106 mg/dL — ABNORMAL HIGH (ref 70–99)
Potassium: 3.9 mmol/L (ref 3.5–5.1)
Sodium: 134 mmol/L — ABNORMAL LOW (ref 135–145)
Total Bilirubin: 0.6 mg/dL (ref 0.3–1.2)
Total Protein: 6.1 g/dL — ABNORMAL LOW (ref 6.5–8.1)

## 2021-06-29 LAB — CREATININE, SERUM
Creatinine, Ser: 0.75 mg/dL (ref 0.61–1.24)
GFR, Estimated: >60 mL/min (ref >60)

## 2021-06-29 MED ORDER — OXYCODONE HCL 5 MG PO TABS
5.0000 mg | ORAL_TABLET | ORAL | Status: DC | PRN
  Administered 2021-06-29 – 2021-07-12 (×39): 5 mg via ORAL
  Filled 2021-06-29 (×39): qty 1

## 2021-06-29 NOTE — Progress Notes (Addendum)
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 Arlyn Dunning washout 03/12/2020 Dr. Durwin Nora Recent admission 06/07/21 occluded right femoral pop bypass status post thrombolysis mechanical thrombectomy R femoropopliteal bypass graft and stent closure with Mynx closure and discharge 6/24  Readmit 6/25 generalized weakness confusion-found to have bladder retention 900 cc possible UTI COVID-positive on admission Rx--Decadron remdesivir Started on cefepime for UTI-UTI which was unfounded on cultures- Had AKI on admission- patient failed voiding trial  Developed spontaneous bleeding from prior area of right femoropopliteal bypass 06/26/2021 and then large-volume bleeding with hypovolemic shock 06/27/2021 Taken to the OR by Dr. Chestine Spore of vascular surgery 7/7 PM--- family has been updated via vascular surgery regarding need for probable amputation  Hospital-Problem based course  Hypovolemic shock 2/2 LE bleed bleeding from area of the right femoropopliteal bypass graft/perforation Status post surgery/ligation 06/27/2021--?  Amputation this admission per vascular surgeon-this has been discussed with the patient's daughter and family 3 units PRBC transfused 06/27/2021-hemoglobin stabilized Leukocytosis ?infected graft?  Antibiotics per VVS--no current fevers or significant hypotension Severe PAD and still smoker status post recent admission right femoropopliteal bypass mechanical thrombectomy 6/17 aspirin 81, Plavix 75 and statin as per VVS given bleeding Pain not well controlled-Oxy IR increased to every 4 continue tramadol every 12 Watch mentation  closely LUTS/BPH Foley catheter placed on admission -outpatient f/u +urologist-continue bethanechol 10 3 times daily, Flomax 0.8 nightly Subdural + post craniectomy 06/2019 +neurological/cognitive deficits continue Celexa 20 Seroquel 100 twice daily Reorient as able Metabolic encephalopathy on admission-DDx COVID versus BPH causing the same Patient orientable somewhat appropriate-caution with meds in the outpatient setting Careful with pain meds not sedating Systolic/diastolic heart failure EF 40-45% diastolic dysfunction noted Compensated-holding home Lasix CABG X4 in 2021 Outpatient cardiology follow-up All antihypertensives and other meds held since bleed 7/7-outpatient resumption LFTs elevated-ultrasound 6/28 steatosis-hepatitis panel negative-HIV negative Transaminitis unclear etiology since 6/26 elevated since 06/16/2021 Likely secondary to COVID versus drug effect versus infection-as trended stable outpatient monitoring COVID-19 on 6/25-resolved Relatively asymptomatic currently-Rx earlier in hospital stay 3 days remdesivir Decadron ending on 6/29 CXR performed 7/6 shows no residual infection or inflammation Elevated D-dimer  2/2 COVID and lower extremity ultrasound was negative hypovolemic hypotonic hyponatremia-resolving Urine sodium less than 10 urine  indicating hypotonic state Lower NS 125 cc/H-->50 cc/h and will saline lock 7/9 as this is resolving   DVT prophylaxis: Lovenox Code Status: DNR Family Communication: d/w fmaily at bedside--telephoned Daughter to ensure she has understanding of need for impending amputation per VVs ins the next several days Disposition:  Status is: Inpatient  Remains inpatient appropriate because:Hemodynamically unstable, IV treatments appropriate due to intensity of illness or inability to take PO, and Inpatient level of care appropriate due to severity of illness  Dispo:  Patient From: Home  Planned Disposition: Skilled Nursing  Facility  Medically stable for discharge: No     Consultants:  vasc  Procedures:   Antimicrobials:     Subjective:  Intermittently confused no distress however stating having pain today he seems comfortable at the bedside he has no nausea no vomiting he has no overt chest pain but his review of systems is not completely reliable Cousin is at the  bedside  Objective: Vitals:   06/28/21 1936 06/28/21 2339 06/29/21 0040 06/29/21 0336  BP: 136/77 (!) 91/47 132/80 126/70  Pulse: 85 74  87  Resp: 12 17 11 13   Temp: 98.1 F (36.7 C) 98.5 F (36.9 C)  98.8 F (37.1 C)  TempSrc: Oral Oral  Oral  SpO2: 100% 100%  100%  Weight:    79.8 kg  Height:        Intake/Output Summary (Last 24 hours) at 06/29/2021 0724 Last data filed at 06/29/2021 0644 Gross per 24 hour  Intake 480 ml  Output 2535 ml  Net -2055 ml    Filed Weights   06/27/21 2023 06/28/21 0500 06/29/21 0336  Weight: 78.1 kg 80.7 kg 79.8 kg    Examination:  EOMI NCAT remembers my name Chest clear no rales no rhonchi Sinus rhythm on monitor sinus to sinus with PVCs Right lower extremity has dressing VAC and seems warm about the dressing Abdomen is soft nontender no rebound     Data Reviewed: personally reviewed   CBC    Component Value Date/Time   WBC 20.0 (H) 06/28/2021 0632   RBC 3.57 (L) 06/28/2021 0632   HGB 9.4 (L) 06/28/2021 0632   HGB 15.7 01/10/2020 1146   HCT 27.9 (L) 06/28/2021 0632   HCT 46.1 01/10/2020 1146   PLT 565 (H) 06/28/2021 0632   PLT 232 01/10/2020 1146   MCV 78.2 (L) 06/28/2021 0632   MCV 83 01/10/2020 1146   MCH 26.3 06/28/2021 0632   MCHC 33.7 06/28/2021 0632   RDW 21.0 (H) 06/28/2021 0632   RDW 12.8 01/10/2020 1146   LYMPHSABS 2.2 06/28/2021 0632   LYMPHSABS 2.5 03/01/2015 1414   MONOABS 1.6 (H) 06/28/2021 0632   EOSABS 0.2 06/28/2021 0632   EOSABS 0.4 03/01/2015 1414   BASOSABS 0.1 06/28/2021 0632   BASOSABS 0.1 03/01/2015 1414   CMP Latest Ref Rng & Units 06/29/2021  06/27/2021 06/27/2021  Glucose 70 - 99 mg/dL - 08/28/2021) 427(C)  BUN 8 - 23 mg/dL - 13 12  Creatinine 623(J - 1.24 mg/dL 6.28 3.15 1.76  Sodium 135 - 145 mmol/L - 133(L) 129(L)  Potassium 3.5 - 5.1 mmol/L - 4.9 4.4  Chloride 98 - 111 mmol/L - 101 99  CO2 22 - 32 mmol/L - - 22  Calcium 8.9 - 10.3 mg/dL - - 9.1  Total Protein 6.5 - 8.1 g/dL - - -  Total Bilirubin 0.3 - 1.2 mg/dL - - -  Alkaline Phos 38 - 126 U/L - - -  AST 15 - 41 U/L - - -  ALT 0 - 44 U/L - - -     Radiology Studies: No results found.   Scheduled Meds:  sodium chloride   Intravenous Once   acetaminophen  650 mg Oral Once   aspirin EC  81 mg Oral Daily   bethanechol  10 mg Oral TID   Chlorhexidine Gluconate Cloth  6 each Topical Daily   citalopram  20 mg Oral Daily   clopidogrel  75 mg Oral Daily   diphenhydrAMINE  25 mg Oral Once   enoxaparin (LOVENOX) injection  40 mg Subcutaneous Q24H   ferrous fumarate-b12-vitamic C-folic acid  1 capsule Oral Daily   furosemide  20 mg Intravenous Once   gabapentin  100 mg Oral BID   melatonin  10 mg Oral QHS   multivitamin with minerals  1 tablet Oral Daily   nicotine  7 mg Transdermal Daily   pantoprazole  40  mg Oral QPM   QUEtiapine  100 mg Oral BID   senna-docusate  1 tablet Oral BID   sodium chloride flush  3 mL Intravenous Once   tamsulosin  0.8 mg Oral QPM   Thrombi-Pad  1 each Topical Once   Continuous Infusions:  sodium chloride Stopped (06/28/21 0553)     LOS: 13 days   Time spent: 10  Rhetta Mura, MD [facility] To contact the attending provider between 7A-7P or the covering provider during after hours 7P-7A, please log into the web site [email or web address] and access using universal [place] password for that web site. If you do not have the password, please call the hospital operator.  06/29/2021, 7:24 AM

## 2021-06-29 NOTE — Progress Notes (Addendum)
Progress Note    06/29/2021 11:01 AM 2 Days Post-Op  Subjective:  No complaints currently.   Vitals:   06/29/21 0336 06/29/21 0835  BP: 126/70 (!) 129/58  Pulse: 87 69  Resp: 13 19  Temp: 98.8 F (37.1 C) 98.5 F (36.9 C)  SpO2: 100% 100%    Physical Exam: General appearance: Awake, alert in no apparent distress Cardiac: Heart rate and rhythm are regular Respirations: Nonlabored Incisions: Right thigh/lower leg incision with wound VAC in place with good seal. Extremities: Right foot dressing intact.   CBC    Component Value Date/Time   WBC 20.0 (H) 06/28/2021 0632   RBC 3.57 (L) 06/28/2021 0632   HGB 9.4 (L) 06/28/2021 0632   HGB 15.7 01/10/2020 1146   HCT 27.9 (L) 06/28/2021 0632   HCT 46.1 01/10/2020 1146   PLT 565 (H) 06/28/2021 0632   PLT 232 01/10/2020 1146   MCV 78.2 (L) 06/28/2021 0632   MCV 83 01/10/2020 1146   MCH 26.3 06/28/2021 0632   MCHC 33.7 06/28/2021 0632   RDW 21.0 (H) 06/28/2021 0632   RDW 12.8 01/10/2020 1146   LYMPHSABS 2.2 06/28/2021 0632   LYMPHSABS 2.5 03/01/2015 1414   MONOABS 1.6 (H) 06/28/2021 0632   EOSABS 0.2 06/28/2021 0632   EOSABS 0.4 03/01/2015 1414   BASOSABS 0.1 06/28/2021 0632   BASOSABS 0.1 03/01/2015 1414    BMET    Component Value Date/Time   NA 134 (L) 06/29/2021 0508   NA 142 01/10/2020 1146   K 3.9 06/29/2021 0508   CL 103 06/29/2021 0508   CO2 24 06/29/2021 0508   GLUCOSE 106 (H) 06/29/2021 0508   BUN 12 06/29/2021 0508   BUN 19 01/10/2020 1146   CREATININE 0.75 06/29/2021 0508   CREATININE 0.71 06/29/2021 0508   CREATININE 0.94 05/24/2021 1549   CALCIUM 8.4 (L) 06/29/2021 0508   GFRNONAA >60 06/29/2021 0508   GFRNONAA >60 06/29/2021 0508   GFRAA >60 09/12/2020 0357     Intake/Output Summary (Last 24 hours) at 06/29/2021 1101 Last data filed at 06/29/2021 4098 Gross per 24 hour  Intake 240 ml  Output 2535 ml  Net -2295 ml    HOSPITAL MEDICATIONS Scheduled Meds:  sodium chloride   Intravenous  Once   acetaminophen  650 mg Oral Once   aspirin EC  81 mg Oral Daily   bethanechol  10 mg Oral TID   Chlorhexidine Gluconate Cloth  6 each Topical Daily   citalopram  20 mg Oral Daily   clopidogrel  75 mg Oral Daily   diphenhydrAMINE  25 mg Oral Once   ferrous fumarate-b12-vitamic C-folic acid  1 capsule Oral Daily   furosemide  20 mg Intravenous Once   gabapentin  100 mg Oral BID   melatonin  10 mg Oral QHS   multivitamin with minerals  1 tablet Oral Daily   nicotine  7 mg Transdermal Daily   pantoprazole  40 mg Oral QPM   QUEtiapine  100 mg Oral BID   senna-docusate  1 tablet Oral BID   sodium chloride flush  3 mL Intravenous Once   tamsulosin  0.8 mg Oral QPM   Thrombi-Pad  1 each Topical Once   Continuous Infusions:  sodium chloride 50 mL/hr at 06/29/21 0915   PRN Meds:.acetaminophen, albuterol, clonazePAM, guaiFENesin-dextromethorphan, oxyCODONE, polyethylene glycol, prochlorperazine, traMADol  Assessment and Plan: POD 2 Exploration of right below-knee popliteal wound 2.  Repair of right popliteal vein 3.  Ligation of right popliteal artery  4.  Placement of negative pressure wound VAC (10 cm x 4 cm x 6 cm)  VAC dressing in place. Continue 3x weekly dressing changes. Right foot wound with persistent leukocytosis. Afebrile. Ongoing discussion with family regarding primary amputation.  Wendi Maya, PA-C [facility] [phone] 06/29/2021  11:01 AM   VASCULAR STAFF ADDENDUM: I have independently interviewed and examined the patient. I agree with the above.   Rande Brunt. Lenell Antu, MD [facility] Phone Number: [phone] 06/30/21 1101

## 2021-06-30 DIAGNOSIS — R4182 Altered mental status, unspecified: Secondary | ICD-10-CM | POA: Diagnosis not present

## 2021-06-30 LAB — COMPREHENSIVE METABOLIC PANEL
ALT: 142 U/L — ABNORMAL HIGH (ref 0–44)
AST: 98 U/L — ABNORMAL HIGH (ref 15–41)
Albumin: 2.4 g/dL — ABNORMAL LOW (ref 3.5–5.0)
Alkaline Phosphatase: 122 U/L (ref 38–126)
Anion gap: 7 (ref 5–15)
BUN: 15 mg/dL (ref 8–23)
CO2: 25 mmol/L (ref 22–32)
Calcium: 8.8 mg/dL — ABNORMAL LOW (ref 8.9–10.3)
Chloride: 102 mmol/L (ref 98–111)
Creatinine, Ser: 0.74 mg/dL (ref 0.61–1.24)
GFR, Estimated: >60 mL/min (ref >60)
Glucose, Bld: 121 mg/dL — ABNORMAL HIGH (ref 70–99)
Potassium: 4 mmol/L (ref 3.5–5.1)
Sodium: 134 mmol/L — ABNORMAL LOW (ref 135–145)
Total Bilirubin: 0.9 mg/dL (ref 0.3–1.2)
Total Protein: 6.3 g/dL — ABNORMAL LOW (ref 6.5–8.1)

## 2021-06-30 LAB — CBC WITH DIFFERENTIAL/PLATELET
Abs Immature Granulocytes: 0.71 10*3/uL — ABNORMAL HIGH (ref 0.00–0.07)
Basophils Absolute: 0.1 10*3/uL (ref 0.0–0.1)
Basophils Relative: 1 %
Eosinophils Absolute: 0.3 10*3/uL (ref 0.0–0.5)
Eosinophils Relative: 2 %
HCT: 25.9 % — ABNORMAL LOW (ref 39.0–52.0)
Hemoglobin: 8.4 g/dL — ABNORMAL LOW (ref 13.0–17.0)
Immature Granulocytes: 4 %
Lymphocytes Relative: 16 %
Lymphs Abs: 2.9 10*3/uL (ref 0.7–4.0)
MCH: 25.8 pg — ABNORMAL LOW (ref 26.0–34.0)
MCHC: 32.4 g/dL (ref 30.0–36.0)
MCV: 79.7 fL — ABNORMAL LOW (ref 80.0–100.0)
Monocytes Absolute: 1.2 10*3/uL — ABNORMAL HIGH (ref 0.1–1.0)
Monocytes Relative: 7 %
Neutro Abs: 12.3 10*3/uL — ABNORMAL HIGH (ref 1.7–7.7)
Neutrophils Relative %: 70 %
Platelets: 527 10*3/uL — ABNORMAL HIGH (ref 150–400)
RBC: 3.25 MIL/uL — ABNORMAL LOW (ref 4.22–5.81)
RDW: 22.4 % — ABNORMAL HIGH (ref 11.5–15.5)
WBC: 17.5 10*3/uL — ABNORMAL HIGH (ref 4.0–10.5)
nRBC: 0 % (ref 0.0–0.2)

## 2021-06-30 MED ORDER — METOPROLOL TARTRATE 12.5 MG HALF TABLET
12.5000 mg | ORAL_TABLET | Freq: Two times a day (BID) | ORAL | Status: DC
  Administered 2021-06-30 – 2021-07-12 (×23): 12.5 mg via ORAL
  Filled 2021-06-30 (×25): qty 1

## 2021-06-30 NOTE — Progress Notes (Signed)
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 Arlyn Dunning washout 03/12/2020 Dr. Durwin Nora Recent admission 06/07/21 occluded right femoral pop bypass status post thrombolysis mechanical thrombectomy R femoropopliteal bypass graft and stent closure with Mynx closure and discharge 6/24  Readmit 6/25 generalized weakness confusion-found to have bladder retention 900 cc possible UTI COVID-positive on admission Rx--Decadron remdesivir Started on cefepime for UTI-UTI which was unfounded on cultures- Had AKI on admission- patient failed voiding trial  Developed spontaneous bleeding from prior area of right femoropopliteal bypass 06/26/2021 and then large-volume bleeding with hypovolemic shock 06/27/2021 Taken to the OR by Dr. Chestine Spore of vascular surgery 7/7 PM--- family has been updated via vascular surgery regarding need for probable amputation  Hospital-Problem based course  Hypovolemic shock 2/2 LE bleed bleeding from area of the right femoropopliteal bypass graft/perforation Ligation in OR 06/27/2021--Amputation nxt week 3 units PRBC transfused 06/27/2021-hemoglobin stabilized 8-9 range Leukocytosis ?infected graft?   Antibiotics per VVS--no current fevers or significant hypotension Severe PAD and still smoker status post recent admission right femoropopliteal bypass mechanical thrombectomy 6/17 aspirin 81, Plavix 75 and statin as per VVS given bleeding Pain less with-Oxy IR increased to every 4-- continue tramadol every 12 LUTS/BPH Foley catheter placed on admission -outpatient f/u +urologist-continue bethanechol 10 3 times daily, Flomax 0.8  nightly Subdural + post craniectomy 06/2019 + chronic underlying mild neurological/cognitive deficits continue Celexa 20 Seroquel 100 twice daily Reorient as able Metabolic encephalopathy on admission-DDx COVID versus BPH causing the same Careful with pain meds given risk of sedation Systolic/diastolic heart failure EF 40-45% diastolic dysfunction noted Compensated-holding home Lasix at this time CABG X4 in 2021 Outpatient cardiology follow-up Antihypertensives held 7/7 secondary to bleeding-resuming metoprolol at 12.5 twice daily dosing LFTs elevated-ultrasound 6/28 steatosis-hepatitis panel negative-HIV negative Transaminitis unclear etiology since 6/26 elevated since 06/16/2021 Likely secondary to COVID versus drug effect versus infection-as trended stable outpatient monitoring COVID-19 on 6/25-resolved Relatively asymptomatic currently-Rx earlier in hospital stay 3 days remdesivir Decadron ending on 6/29 CXR performed 7/6 shows no residual infection or inflammation Elevated D-dimer  2/2 COVID and lower extremity ultrasound was negative hypovolemic hypotonic hyponatremia-resolving Urine sodium less than 10 urine  indicating hypotonic state Lower NS 125 cc/H-->50 cc/h --periodic lab checks-has resolved   DVT prophylaxis: Lovenox Code Status: DNR Family Communication: Daughter aware of amputation need in the next week or so Disposition:  Status is: Inpatient  Remains inpatient appropriate because:Hemodynamically unstable, IV treatments appropriate due to intensity of illness or inability to take PO, and Inpatient level of care appropriate due to severity of illness  Dispo:  Patient From:    Planned Disposition: Skilled Nursing Facility  Medically stable for discharge:       Consultants:  vasc  Procedures:   Antimicrobials:     Subjective:  Pain seems controlled He recognizes me Wound VAC in place Wound not examined Getting a shower No other spontaneous  complaint Coherent does not seem to be in distress Chest clear posterolaterally  Objective: Vitals:   06/29/21 1600 06/29/21 2000 06/30/21 0100 06/30/21 0807  BP: 117/62 130/80 139/74 (!) 145/83  Pulse: 83 83 82 79  Resp: 13   12  Temp: 99.3 F (37.4  C) 98.8 F (37.1 C) 98.2 F (36.8 C) 98 F (36.7 C)  TempSrc: Oral Oral Oral Oral  SpO2: 98% 98%  99%  Weight:      Height:        Intake/Output Summary (Last 24 hours) at 06/30/2021 1052 Last data filed at 06/30/2021 0100 Gross per 24 hour  Intake --  Output 1200 ml  Net -1200 ml    Filed Weights   06/27/21 2023 06/28/21 0500 06/29/21 0336  Weight: 78.1 kg 80.7 kg 79.8 kg    Examination:   Abdomen soft no rebound Lower extremity remains the same with a VAC foot wound not examined Abdomen is soft nontender no rebound no guarding Sinus rhythm S1-S2 no murmur    Data Reviewed: personally reviewed   CBC    Component Value Date/Time   WBC 17.5 (H) 06/30/2021 0243   RBC 3.25 (L) 06/30/2021 0243   HGB 8.4 (L) 06/30/2021 0243   HGB 15.7 01/10/2020 1146   HCT 25.9 (L) 06/30/2021 0243   HCT 46.1 01/10/2020 1146   PLT 527 (H) 06/30/2021 0243   PLT 232 01/10/2020 1146   MCV 79.7 (L) 06/30/2021 0243   MCV 83 01/10/2020 1146   MCH 25.8 (L) 06/30/2021 0243   MCHC 32.4 06/30/2021 0243   RDW 22.4 (H) 06/30/2021 0243   RDW 12.8 01/10/2020 1146   LYMPHSABS 2.9 06/30/2021 0243   LYMPHSABS 2.5 03/01/2015 1414   MONOABS 1.2 (H) 06/30/2021 0243   EOSABS 0.3 06/30/2021 0243   EOSABS 0.4 03/01/2015 1414   BASOSABS 0.1 06/30/2021 0243   BASOSABS 0.1 03/01/2015 1414   CMP Latest Ref Rng & Units 06/30/2021 06/29/2021 06/29/2021  Glucose 70 - 99 mg/dL 671(I) 458(K) -  BUN 8 - 23 mg/dL 15 12 -  Creatinine 9.98 - 1.24 mg/dL 3.38 2.50 5.39  Sodium 135 - 145 mmol/L 134(L) 134(L) -  Potassium 3.5 - 5.1 mmol/L 4.0 3.9 -  Chloride 98 - 111 mmol/L 102 103 -  CO2 22 - 32 mmol/L 25 24 -  Calcium 8.9 - 10.3 mg/dL 7.6(B) 3.4(L) -  Total  Protein 6.5 - 8.1 g/dL 6.3(L) 6.1(L) -  Total Bilirubin 0.3 - 1.2 mg/dL 0.9 0.6 -  Alkaline Phos 38 - 126 U/L 122 136(H) -  AST 15 - 41 U/L 98(H) 121(H) -  ALT 0 - 44 U/L 142(H) 138(H) -     Radiology Studies: No results found.   Scheduled Meds:  sodium chloride   Intravenous Once   acetaminophen  650 mg Oral Once   aspirin EC  81 mg Oral Daily   bethanechol  10 mg Oral TID   Chlorhexidine Gluconate Cloth  6 each Topical Daily   citalopram  20 mg Oral Daily   clopidogrel  75 mg Oral Daily   diphenhydrAMINE  25 mg Oral Once   ferrous fumarate-b12-vitamic C-folic acid  1 capsule Oral Daily   furosemide  20 mg Intravenous Once   gabapentin  100 mg Oral BID   melatonin  10 mg Oral QHS   multivitamin with minerals  1 tablet Oral Daily   nicotine  7 mg Transdermal Daily   pantoprazole  40 mg Oral QPM   QUEtiapine  100 mg Oral BID   senna-docusate  1 tablet Oral BID   sodium chloride flush  3 mL Intravenous Once   tamsulosin  0.8 mg Oral QPM   Thrombi-Pad  1 each Topical Once   Continuous Infusions:    LOS: 14 days  Time spent: 45  Rhetta Mura, MD [facility] To contact the attending provider between 7A-7P or the covering provider during after hours 7P-7A, please log into the web site [email or web address] and access using universal  password for that web site. If you do not have the password, please call the hospital operator.  06/30/2021, 10:52 AM

## 2021-07-01 ENCOUNTER — Ambulatory Visit: Payer: Medicare Other

## 2021-07-01 ENCOUNTER — Ambulatory Visit: Payer: Medicare Other | Admitting: Surgery

## 2021-07-01 DIAGNOSIS — R4182 Altered mental status, unspecified: Secondary | ICD-10-CM | POA: Diagnosis not present

## 2021-07-01 LAB — BPAM RBC
Blood Product Expiration Date: 202207142359
Blood Product Expiration Date: 202208052359
Blood Product Expiration Date: 202208052359
Blood Product Expiration Date: 202208052359
Blood Product Expiration Date: 202208052359
Blood Product Expiration Date: 202208052359
ISSUE DATE / TIME: 202207071528
ISSUE DATE / TIME: 202207071709
ISSUE DATE / TIME: 202207071749
ISSUE DATE / TIME: 202207071749
Unit Type and Rh: 5100
Unit Type and Rh: 5100
Unit Type and Rh: 5100
Unit Type and Rh: 5100
Unit Type and Rh: 5100
Unit Type and Rh: 5100

## 2021-07-01 LAB — TYPE AND SCREEN
ABO/RH(D): O POS
Antibody Screen: NEGATIVE
Unit division: 0
Unit division: 0
Unit division: 0
Unit division: 0
Unit division: 0
Unit division: 0

## 2021-07-01 LAB — CBC WITH DIFFERENTIAL/PLATELET
Abs Immature Granulocytes: 0.49 10*3/uL — ABNORMAL HIGH (ref 0.00–0.07)
Basophils Absolute: 0.1 10*3/uL (ref 0.0–0.1)
Basophils Relative: 0 %
Eosinophils Absolute: 0.2 10*3/uL (ref 0.0–0.5)
Eosinophils Relative: 1 %
HCT: 27.5 % — ABNORMAL LOW (ref 39.0–52.0)
Hemoglobin: 8.6 g/dL — ABNORMAL LOW (ref 13.0–17.0)
Immature Granulocytes: 3 %
Lymphocytes Relative: 13 %
Lymphs Abs: 2.5 10*3/uL (ref 0.7–4.0)
MCH: 25.5 pg — ABNORMAL LOW (ref 26.0–34.0)
MCHC: 31.3 g/dL (ref 30.0–36.0)
MCV: 81.6 fL (ref 80.0–100.0)
Monocytes Absolute: 1.1 10*3/uL — ABNORMAL HIGH (ref 0.1–1.0)
Monocytes Relative: 6 %
Neutro Abs: 14.7 10*3/uL — ABNORMAL HIGH (ref 1.7–7.7)
Neutrophils Relative %: 77 %
Platelets: 556 10*3/uL — ABNORMAL HIGH (ref 150–400)
RBC: 3.37 MIL/uL — ABNORMAL LOW (ref 4.22–5.81)
RDW: 22.9 % — ABNORMAL HIGH (ref 11.5–15.5)
WBC: 18.7 10*3/uL — ABNORMAL HIGH (ref 4.0–10.5)
nRBC: 0 % (ref 0.0–0.2)

## 2021-07-01 LAB — COMPREHENSIVE METABOLIC PANEL
ALT: 182 U/L — ABNORMAL HIGH (ref 0–44)
AST: 138 U/L — ABNORMAL HIGH (ref 15–41)
Albumin: 2.3 g/dL — ABNORMAL LOW (ref 3.5–5.0)
Alkaline Phosphatase: 120 U/L (ref 38–126)
Anion gap: 8 (ref 5–15)
BUN: 11 mg/dL (ref 8–23)
CO2: 24 mmol/L (ref 22–32)
Calcium: 9.1 mg/dL (ref 8.9–10.3)
Chloride: 103 mmol/L (ref 98–111)
Creatinine, Ser: 0.72 mg/dL (ref 0.61–1.24)
GFR, Estimated: >60 mL/min (ref >60)
Glucose, Bld: 105 mg/dL — ABNORMAL HIGH (ref 70–99)
Potassium: 4.3 mmol/L (ref 3.5–5.1)
Sodium: 135 mmol/L (ref 135–145)
Total Bilirubin: 0.7 mg/dL (ref 0.3–1.2)
Total Protein: 6.4 g/dL — ABNORMAL LOW (ref 6.5–8.1)

## 2021-07-01 MED ORDER — DEXTROSE IN LACTATED RINGERS 5 % IV SOLN: INTRAVENOUS | Status: DC

## 2021-07-01 NOTE — Progress Notes (Signed)
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 Arlyn Dunning washout 03/12/2020 Dr. Durwin Nora Recent admission 06/07/21 occluded right femoral pop bypass status post thrombolysis mechanical thrombectomy R femoropopliteal bypass graft and stent closure with Mynx closure and discharge 6/24  Readmit 6/25 generalized weakness confusion-found to have bladder retention 900 cc possible UTI COVID-positive on admission Rx--Decadron remdesivir Started on cefepime for UTI-UTI which was unfounded on cultures- Had AKI on admission- patient failed voiding trial  Developed spontaneous bleeding from prior area of right femoropopliteal bypass 06/26/2021 and then large-volume bleeding with hypovolemic shock 06/27/2021 Taken to the OR by Dr. Chestine Spore of vascular surgery 7/7 PM--- family has been updated via vascular surgery regarding need for probable amputation  Hospital-Problem based course  Hypovolemic shock 2/2 LE bleed bleeding from area of the right femoropopliteal bypass graft/perforation Ligation in OR 06/27/2021--Amputation scheduled by vascular surgeons for later this week Start LR/D5 50 cc/H tonight 3 units PRBC transfused 06/27/2021-hemoglobin stabilized 8-9 range Leukocytosis ?infected graft?   Antibiotics per VVS---see above Severe PAD and still smoker status post recent admission right femoropopliteal bypass mechanical thrombectomy 6/17 aspirin 81, Plavix 75 and statin as per VVS given bleeding Pain less with-Oxy IR increased to every 4--tramadol every 12 --Pain is moderately controlled LUTS/BPH Foley catheter placed on admission --need urologist on  discharge -continue bethanechol 10 3 times daily, Flomax 0.8 nightly Subdural + post craniectomy 06/2019 + chronic underlying mild neurological/cognitive deficits continue Celexa 20 Seroquel 100 twice daily Reorient as able Metabolic encephalopathy on admission-DDx COVID versus BPH causing the same Cautious titration pain meds-risk of sedation/encephalopathy Systolic/diastolic heart failure EF 40-45% diastolic dysfunction noted Compensated-holding home Lasix at this time CABG X4 in 2021 Outpatient cardiology follow-up Antihypertensives held 7/7, 2/2 bleeding metoprolol at 12.5 twice daily currently LFTs elevated-ultrasound 6/28 steatosis-hepatitis panel negative-HIV negative Transaminitis ?etiology since 6/26 elevated since 06/16/2021 ?2/2  COVID?  drug effect ? infection-trends stable COVID-19 on 6/25-resolved Relatively asymptomatic currently-Rx earlier in hospital stay 3 days remdesivir Decadron ending on 6/29 CXR performed 7/6 shows no residual infection or inflammation Elevated D-dimer  2/2 COVID and lower extremity ultrasound was negative hypovolemic hypotonic hyponatremia-resolving Urine sodium less than 10 urine  indicating hypotonic state Lower NS 125 cc/H-->50 cc/h --periodic lab checks-has resolved   DVT prophylaxis: Lovenox Code Status: DNR Family Communication: Daughter has been updated by Dr. Chestine Spore regarding amputation-I will follow-up with discussions in the next day or so regarding other issues Disposition:  Status is: Inpatient  Remains inpatient appropriate because:Hemodynamically unstable, IV treatments appropriate due to intensity of illness or inability to take PO, and Inpatient level of care appropriate due to severity of illness  Dispo:  Patient From:    Planned Disposition: Skilled Nursing Facility  Medically stable for discharge:       Consultants:  vasc  Procedures:   Antimicrobials:     Subjective:  Pain controlled Tolerating some diet but  somewhat confused per nursing He remembers my name however He cannot tell me the date or time clearly-he does remember that he has had some procedures done-he cannot verbalize back to me clearly what type of procedure will be done later this week It appears his surgeon have  spoken to the patient's daughter and patient is on schedule for AKA tomorrow of the right lower extremity  Objective: Vitals:   06/30/21 2000 07/01/21 0544 07/01/21 0732 07/01/21 1120  BP: (!) 145/77 135/78 137/75 112/82  Pulse: 87 77 79 71  Resp: 16 20 18 14   Temp: 98.4 F (36.9 C) 97.7 F (36.5 C) 98.4 F (36.9 C) 98.5 F (36.9 C)  TempSrc: Oral Oral Oral Oral  SpO2: 100% 99% 99% 100%  Weight:  83.2 kg    Height:        Intake/Output Summary (Last 24 hours) at 07/01/2021 1432 Last data filed at 07/01/2021 0900 Gross per 24 hour  Intake 960 ml  Output 1900 ml  Net -940 ml    Filed Weights   06/28/21 0500 06/29/21 0336 07/01/21 0544  Weight: 80.7 kg 79.8 kg 83.2 kg    Examination:   EOMI NCAT, coherent x1 S1-S2 holosystolic murmur Trulicity Abdomen soft no rebound no guarding  CTA B no rales no rhonchi Lower extremity unwrapped as per nursing as below    Data Reviewed: personally reviewed   CBC    Component Value Date/Time   WBC 18.7 (H) 07/01/2021 0123   RBC 3.37 (L) 07/01/2021 0123   HGB 8.6 (L) 07/01/2021 0123   HGB 15.7 01/10/2020 1146   HCT 27.5 (L) 07/01/2021 0123   HCT 46.1 01/10/2020 1146   PLT 556 (H) 07/01/2021 0123   PLT 232 01/10/2020 1146   MCV 81.6 07/01/2021 0123   MCV 83 01/10/2020 1146   MCH 25.5 (L) 07/01/2021 0123   MCHC 31.3 07/01/2021 0123   RDW 22.9 (H) 07/01/2021 0123   RDW 12.8 01/10/2020 1146   LYMPHSABS 2.5 07/01/2021 0123   LYMPHSABS 2.5 03/01/2015 1414   MONOABS 1.1 (H) 07/01/2021 0123   EOSABS 0.2 07/01/2021 0123   EOSABS 0.4 03/01/2015 1414   BASOSABS 0.1 07/01/2021 0123   BASOSABS 0.1 03/01/2015 1414   CMP Latest Ref Rng & Units 07/01/2021  06/30/2021 06/29/2021  Glucose 70 - 99 mg/dL 08/30/2021) 694(W) 546(E)  BUN 8 - 23 mg/dL 11 15 12   Creatinine 0.61 - 1.24 mg/dL 703(J 0.09  Sodium 135 - 145 mmol/L 135 134(L) 134(L)  Potassium 3.5 - 5.1 mmol/L 4.3 4.0 3.9  Chloride 98 - 111 mmol/L 103 102 103  CO2 22 - 32 mmol/L 24 25 24   Calcium 8.9 - 10.3 mg/dL 9.1 3.81) 8.29)  Total Protein 6.5 - 8.1 g/dL 6.4(L) 6.3(L) 6.1(L)  Total Bilirubin 0.3 - 1.2 mg/dL 0.7 0.9 0.6  Alkaline Phos 38 - 126 U/L 120 122 136(H)  AST 15 - 41 U/L 138(H) 98(H) 121(H)  ALT 0 - 44 U/L 182(H) 142(H) 138(H)     Radiology Studies: No results found.   Scheduled Meds:  sodium chloride   Intravenous Once   acetaminophen  650 mg Oral Once   aspirin EC  81 mg Oral Daily   bethanechol  10 mg Oral TID   Chlorhexidine Gluconate Cloth  6 each Topical Daily   citalopram  20 mg Oral Daily   clopidogrel  75 mg Oral Daily   diphenhydrAMINE  25 mg Oral Once   ferrous fumarate-b12-vitamic C-folic acid  1 capsule Oral Daily   furosemide  20 mg Intravenous Once   gabapentin  100 mg Oral BID   melatonin  10 mg Oral QHS   metoprolol tartrate  12.5 mg Oral BID   multivitamin with minerals  1 tablet Oral Daily   nicotine  7 mg Transdermal Daily   pantoprazole  40 mg Oral QPM   QUEtiapine  100 mg Oral BID   senna-docusate  1 tablet Oral BID   tamsulosin  0.8 mg Oral QPM   Continuous Infusions:    LOS: 15 days   Time spent: 18  Rhetta Mura, MD [facility] To contact the attending provider between 7A-7P or the covering provider during after hours 7P-7A, please log into the web site [email or web address] and access using universal [place] password for that web site. If you do not have the password, please call the hospital operator.  07/01/2021, 2:32 PM

## 2021-07-01 NOTE — Plan of Care (Signed)
  Problem: Health Behavior/Discharge Planning: Goal: Ability to manage health-related needs will improve Outcome: Progressing   

## 2021-07-01 NOTE — Progress Notes (Signed)
[facility] [place]  Subjective  -states his foot does not hurt although bedside nursing states he did get a lot of narcotics overnight for right foot pain.   Objective 137/75 79 98.4 F (36.9 C) (Oral) 18 99%  Intake/Output Summary (Last 24 hours) at 07/01/2021 0947 Last data filed at 07/01/2021 0544 Gross per 24 hour  Intake 240 ml  Output 1900 ml  Net -1660 ml    Right below-knee popliteal incision with VAC with good seal Right foot with significant tissue loss  Laboratory Lab Results: Recent Labs    06/30/21 0243 07/01/21 0123  WBC 17.5* 18.7*  HGB 8.4* 8.6*  HCT 25.9* 27.5*  PLT 527* 556*   BMET Recent Labs    06/30/21 0243 07/01/21 0123  NA 134* 135  K 4.0 4.3  CL 102 103  CO2 25 24  GLUCOSE 121* 105*  BUN 15 11  CREATININE 0.74 0.72  CALCIUM 8.8* 9.1    COAG Lab Results  Component Value Date   INR 1.1 06/27/2021   INR 1.1 09/10/2020   INR 1.0 03/01/2020   No results found for: PTT  Assessment/Planning:  70 year old male that had occluded right femoropopliteal bypass that underwent lysis and then developed hematoma requiring emergent washout on Thursday evening that showed graft was disrupted distally and was also reoccluded.  I think his only option is an above-knee amputation as Dr. Myra Gianotti previously discussed.  I discussed this with him today.  I think he does have some confusion given he says his foot does not hurt but then nursing states that he is requiring a lot of narcotics.  I will talk to his daughter today by phone to update her.  Lonnie [name] 07/01/2021 9:47 AM --

## 2021-07-01 NOTE — Progress Notes (Signed)
I have discussed with the patient's daughter and they are amendable to proceed with right above-knee amputation.  They state he is having significant pain at bedside here in the hospital.  I will tentatively add him on tomorrow for Dr. Darrick Penna and if we cannot get it done tomorrow we will plan for Wednesday.  Please keep n.p.o. after midnight.  Cephus Shelling, MD [facility] [place] Office: [phone]   Cephus Shelling

## 2021-07-02 ENCOUNTER — Encounter (HOSPITAL_COMMUNITY)
Admission: EM | Disposition: A | Discharge status: Skilled Nursing Facility | Payer: Self-pay | Source: Home / Self Care | Attending: Family Medicine

## 2021-07-02 ENCOUNTER — Encounter (HOSPITAL_COMMUNITY): Payer: Self-pay | Admitting: Student

## 2021-07-02 ENCOUNTER — Inpatient Hospital Stay (HOSPITAL_COMMUNITY): Payer: Medicare Other | Admitting: Anesthesiology

## 2021-07-02 DIAGNOSIS — R4182 Altered mental status, unspecified: Secondary | ICD-10-CM | POA: Diagnosis not present

## 2021-07-02 DIAGNOSIS — I96 Gangrene, not elsewhere classified: Secondary | ICD-10-CM

## 2021-07-02 HISTORY — PX: AMPUTATION: SHX166

## 2021-07-02 LAB — COMPREHENSIVE METABOLIC PANEL
ALT: 162 U/L — ABNORMAL HIGH (ref 0–44)
AST: 93 U/L — ABNORMAL HIGH (ref 15–41)
Albumin: 2.4 g/dL — ABNORMAL LOW (ref 3.5–5.0)
Alkaline Phosphatase: 111 U/L (ref 38–126)
Anion gap: 6 (ref 5–15)
BUN: 11 mg/dL (ref 8–23)
CO2: 27 mmol/L (ref 22–32)
Calcium: 9.3 mg/dL (ref 8.9–10.3)
Chloride: 101 mmol/L (ref 98–111)
Creatinine, Ser: 0.8 mg/dL (ref 0.61–1.24)
GFR, Estimated: >60 mL/min (ref >60)
Glucose, Bld: 139 mg/dL — ABNORMAL HIGH (ref 70–99)
Potassium: 4.5 mmol/L (ref 3.5–5.1)
Sodium: 134 mmol/L — ABNORMAL LOW (ref 135–145)
Total Bilirubin: 0.5 mg/dL (ref 0.3–1.2)
Total Protein: 6.8 g/dL (ref 6.5–8.1)

## 2021-07-02 LAB — CBC WITH DIFFERENTIAL/PLATELET
Abs Immature Granulocytes: 0.29 10*3/uL — ABNORMAL HIGH (ref 0.00–0.07)
Basophils Absolute: 0.1 10*3/uL (ref 0.0–0.1)
Basophils Relative: 1 %
Eosinophils Absolute: 0.2 10*3/uL (ref 0.0–0.5)
Eosinophils Relative: 1 %
HCT: 27.6 % — ABNORMAL LOW (ref 39.0–52.0)
Hemoglobin: 8.6 g/dL — ABNORMAL LOW (ref 13.0–17.0)
Immature Granulocytes: 2 %
Lymphocytes Relative: 13 %
Lymphs Abs: 2.1 10*3/uL (ref 0.7–4.0)
MCH: 25.4 pg — ABNORMAL LOW (ref 26.0–34.0)
MCHC: 31.2 g/dL (ref 30.0–36.0)
MCV: 81.4 fL (ref 80.0–100.0)
Monocytes Absolute: 1 10*3/uL (ref 0.1–1.0)
Monocytes Relative: 7 %
Neutro Abs: 12.1 10*3/uL — ABNORMAL HIGH (ref 1.7–7.7)
Neutrophils Relative %: 76 %
Platelets: 505 10*3/uL — ABNORMAL HIGH (ref 150–400)
RBC: 3.39 MIL/uL — ABNORMAL LOW (ref 4.22–5.81)
RDW: 22.8 % — ABNORMAL HIGH (ref 11.5–15.5)
WBC: 15.7 10*3/uL — ABNORMAL HIGH (ref 4.0–10.5)
nRBC: 0 % (ref 0.0–0.2)

## 2021-07-02 LAB — PREPARE RBC (CROSSMATCH)

## 2021-07-02 SURGERY — AMPUTATION, ABOVE KNEE
Anesthesia: General | Site: Knee | Laterality: Right

## 2021-07-02 MED ORDER — BACITRACIN ZINC 500 UNIT/GM EX OINT
TOPICAL_OINTMENT | CUTANEOUS | Status: DC | PRN
  Administered 2021-07-02: 1 via TOPICAL

## 2021-07-02 MED ORDER — DEXAMETHASONE SODIUM PHOSPHATE 10 MG/ML IJ SOLN
INTRAMUSCULAR | Status: AC
  Filled 2021-07-02: qty 1

## 2021-07-02 MED ORDER — PHENYLEPHRINE HCL-NACL 10-0.9 MG/250ML-% IV SOLN
INTRAVENOUS | Status: DC | PRN
  Administered 2021-07-02: 25 ug/min via INTRAVENOUS

## 2021-07-02 MED ORDER — DEXAMETHASONE SODIUM PHOSPHATE 10 MG/ML IJ SOLN
INTRAMUSCULAR | Status: DC | PRN
  Administered 2021-07-02: 4 mg via INTRAVENOUS

## 2021-07-02 MED ORDER — FENTANYL CITRATE (PF) 250 MCG/5ML IJ SOLN
INTRAMUSCULAR | Status: AC
  Filled 2021-07-02: qty 5

## 2021-07-02 MED ORDER — MIDAZOLAM HCL 2 MG/2ML IJ SOLN
INTRAMUSCULAR | Status: AC
  Filled 2021-07-02: qty 2

## 2021-07-02 MED ORDER — FENTANYL CITRATE (PF) 100 MCG/2ML IJ SOLN
INTRAMUSCULAR | Status: AC
  Filled 2021-07-02: qty 2

## 2021-07-02 MED ORDER — ONDANSETRON HCL 4 MG/2ML IJ SOLN
INTRAMUSCULAR | Status: AC
  Filled 2021-07-02: qty 2

## 2021-07-02 MED ORDER — FENTANYL CITRATE (PF) 100 MCG/2ML IJ SOLN
25.0000 ug | INTRAMUSCULAR | Status: DC | PRN
  Administered 2021-07-02: 50 ug via INTRAVENOUS

## 2021-07-02 MED ORDER — LIDOCAINE 2% (20 MG/ML) 5 ML SYRINGE
INTRAMUSCULAR | Status: AC
  Filled 2021-07-02: qty 5

## 2021-07-02 MED ORDER — PROPOFOL 10 MG/ML IV BOLUS
INTRAVENOUS | Status: AC
  Filled 2021-07-02: qty 20

## 2021-07-02 MED ORDER — CEFAZOLIN SODIUM-DEXTROSE 1-4 GM/50ML-% IV SOLN
1.0000 g | Freq: Three times a day (TID) | INTRAVENOUS | Status: DC
Start: 2021-07-02 — End: 2021-07-04
  Administered 2021-07-02 – 2021-07-04 (×5): 1 g via INTRAVENOUS
  Filled 2021-07-02 (×6): qty 50

## 2021-07-02 MED ORDER — ONDANSETRON HCL 4 MG/2ML IJ SOLN
INTRAMUSCULAR | Status: DC | PRN
  Administered 2021-07-02: 4 mg via INTRAVENOUS

## 2021-07-02 MED ORDER — CHLORHEXIDINE GLUCONATE 0.12 % MT SOLN
OROMUCOSAL | Status: AC
  Administered 2021-07-02: 15 mL
  Filled 2021-07-02: qty 15

## 2021-07-02 MED ORDER — HYDROMORPHONE HCL 1 MG/ML IJ SOLN
0.5000 mg | INTRAMUSCULAR | Status: DC | PRN
  Administered 2021-07-03 – 2021-07-11 (×8): 0.5 mg via INTRAVENOUS
  Filled 2021-07-02 (×8): qty 1

## 2021-07-02 MED ORDER — FENTANYL CITRATE (PF) 250 MCG/5ML IJ SOLN
INTRAMUSCULAR | Status: DC | PRN
  Administered 2021-07-02: 50 ug via INTRAVENOUS
  Administered 2021-07-02: 100 ug via INTRAVENOUS

## 2021-07-02 MED ORDER — LACTATED RINGERS IV SOLN: INTRAVENOUS | Status: DC | PRN

## 2021-07-02 MED ORDER — BACITRACIN ZINC 500 UNIT/GM EX OINT
TOPICAL_OINTMENT | CUTANEOUS | Status: AC
  Filled 2021-07-02: qty 28.35

## 2021-07-02 MED ORDER — ROCURONIUM BROMIDE 10 MG/ML (PF) SYRINGE
PREFILLED_SYRINGE | INTRAVENOUS | Status: DC | PRN
  Administered 2021-07-02: 80 mg via INTRAVENOUS

## 2021-07-02 MED ORDER — PHENYLEPHRINE 40 MCG/ML (10ML) SYRINGE FOR IV PUSH (FOR BLOOD PRESSURE SUPPORT)
PREFILLED_SYRINGE | INTRAVENOUS | Status: AC
  Filled 2021-07-02: qty 10

## 2021-07-02 MED ORDER — ONDANSETRON HCL 4 MG/2ML IJ SOLN: 4.0000 mg | Freq: Once | INTRAMUSCULAR | Status: DC | PRN

## 2021-07-02 MED ORDER — 0.9 % SODIUM CHLORIDE (POUR BTL) OPTIME
TOPICAL | Status: DC | PRN
  Administered 2021-07-02 (×3): 1000 mL

## 2021-07-02 MED ORDER — PROPOFOL 10 MG/ML IV BOLUS
INTRAVENOUS | Status: DC | PRN
  Administered 2021-07-02: 160 mg via INTRAVENOUS

## 2021-07-02 MED ORDER — PHENYLEPHRINE 40 MCG/ML (10ML) SYRINGE FOR IV PUSH (FOR BLOOD PRESSURE SUPPORT)
PREFILLED_SYRINGE | INTRAVENOUS | Status: DC | PRN
  Administered 2021-07-02 (×2): 120 ug via INTRAVENOUS
  Administered 2021-07-02: 160 ug via INTRAVENOUS

## 2021-07-02 MED ORDER — LIDOCAINE 2% (20 MG/ML) 5 ML SYRINGE
INTRAMUSCULAR | Status: DC | PRN
  Administered 2021-07-02: 80 mg via INTRAVENOUS

## 2021-07-02 MED ORDER — ONDANSETRON HCL 4 MG/2ML IJ SOLN: 4.0000 mg | Freq: Four times a day (QID) | INTRAMUSCULAR | Status: DC | PRN

## 2021-07-02 MED ORDER — SUGAMMADEX SODIUM 200 MG/2ML IV SOLN
INTRAVENOUS | Status: DC | PRN
  Administered 2021-07-02: 200 mg via INTRAVENOUS

## 2021-07-02 MED ORDER — CEFAZOLIN SODIUM-DEXTROSE 2-3 GM-%(50ML) IV SOLR
INTRAVENOUS | Status: DC | PRN
  Administered 2021-07-02: 2 g via INTRAVENOUS

## 2021-07-02 MED ORDER — ALBUMIN HUMAN 5 % IV SOLN: INTRAVENOUS | Status: DC | PRN

## 2021-07-02 SURGICAL SUPPLY — 47 items
BAG COUNTER SPONGE SURGICOUNT (BAG) ×2 IMPLANT
BAG SPNG CNTER NS LX DISP (BAG) ×1
BLADE SAW RECIP 87.9 MT (BLADE) ×2 IMPLANT
BNDG COHESIVE 4X5 TAN STRL (GAUZE/BANDAGES/DRESSINGS) ×2 IMPLANT
BNDG COHESIVE 6X5 TAN STRL LF (GAUZE/BANDAGES/DRESSINGS) ×2 IMPLANT
BNDG ELASTIC 4X5.8 VLCR STR LF (GAUZE/BANDAGES/DRESSINGS) ×1 IMPLANT
BNDG ELASTIC 6X5.8 VLCR STR LF (GAUZE/BANDAGES/DRESSINGS) ×1 IMPLANT
BNDG GAUZE ELAST 4 BULKY (GAUZE/BANDAGES/DRESSINGS) ×2 IMPLANT
CANISTER SUCT 3000ML PPV (MISCELLANEOUS) ×2 IMPLANT
CLIP VESOCCLUDE MED 6/CT (CLIP) IMPLANT
CNTNR URN SCR LID CUP LEK RST (MISCELLANEOUS) IMPLANT
CONT SPEC 4OZ STRL OR WHT (MISCELLANEOUS) ×2
COVER SURGICAL LIGHT HANDLE (MISCELLANEOUS) ×2 IMPLANT
DRAPE HALF SHEET 40X57 (DRAPES) ×2 IMPLANT
DRAPE ORTHO SPLIT 77X108 STRL (DRAPES) ×4
DRAPE SURG ORHT 6 SPLT 77X108 (DRAPES) ×2 IMPLANT
DRSG ADAPTIC 3X8 NADH LF (GAUZE/BANDAGES/DRESSINGS) ×1 IMPLANT
ELECT CAUTERY BLADE 6.4 (BLADE) ×2 IMPLANT
ELECT REM PT RETURN 9FT ADLT (ELECTROSURGICAL) ×2
ELECTRODE REM PT RTRN 9FT ADLT (ELECTROSURGICAL) ×1 IMPLANT
GAUZE SPONGE 4X4 12PLY STRL (GAUZE/BANDAGES/DRESSINGS) ×2 IMPLANT
GLOVE SURG ENC MOIS LTX SZ7.5 (GLOVE) ×2 IMPLANT
GOWN STRL REUS W/ TWL LRG LVL3 (GOWN DISPOSABLE) ×3 IMPLANT
GOWN STRL REUS W/TWL LRG LVL3 (GOWN DISPOSABLE) ×6
KIT BASIN OR (CUSTOM PROCEDURE TRAY) ×2 IMPLANT
KIT TURNOVER KIT B (KITS) ×2 IMPLANT
NS IRRIG 1000ML POUR BTL (IV SOLUTION) ×2 IMPLANT
PACK GENERAL/GYN (CUSTOM PROCEDURE TRAY) ×2 IMPLANT
PAD ARMBOARD 7.5X6 YLW CONV (MISCELLANEOUS) ×4 IMPLANT
SPONGE T-LAP 18X18 ~~LOC~~+RFID (SPONGE) ×2 IMPLANT
STAPLER VISISTAT 35W (STAPLE) ×2 IMPLANT
STOCKINETTE IMPERVIOUS LG (DRAPES) ×2 IMPLANT
SUT ETHILON 3 0 PS 1 (SUTURE) IMPLANT
SUT SILK 2 0 (SUTURE) ×2
SUT SILK 2 0 SH (SUTURE) IMPLANT
SUT SILK 2 0 SH CR/8 (SUTURE) ×4 IMPLANT
SUT SILK 2 0 TIES 10X30 (SUTURE) ×1 IMPLANT
SUT SILK 2-0 18XBRD TIE 12 (SUTURE) IMPLANT
SUT SILK 3 0 (SUTURE) ×2
SUT SILK 3-0 18XBRD TIE 12 (SUTURE) IMPLANT
SUT VIC AB 2-0 CT1 18 (SUTURE) IMPLANT
SUT VIC AB 2-0 SH 18 (SUTURE) ×4 IMPLANT
SUT VIC AB 3-0 SH 27 (SUTURE) ×2
SUT VIC AB 3-0 SH 27X BRD (SUTURE) ×1 IMPLANT
TOWEL GREEN STERILE (TOWEL DISPOSABLE) ×4 IMPLANT
UNDERPAD 30X36 HEAVY ABSORB (UNDERPADS AND DIAPERS) ×2 IMPLANT
WATER STERILE IRR 1000ML POUR (IV SOLUTION) ×2 IMPLANT

## 2021-07-02 NOTE — Progress Notes (Signed)
This RN and another RN unable to doppler pulses in the left lower leg and foot  Leg is cool to the touch below the knee and patient denies pain in that leg. Sensation intact. Femoral pulse dopplered. On-call MD made aware. Continue current plan of care.   Allegra Grana RN

## 2021-07-02 NOTE — Transfer of Care (Signed)
Immediate Anesthesia Transfer of Care Note  Patient: Lonnie [name]  Procedure(s) Performed: RIGHT ABOVE KNEE AMPUTATION (Right: Knee)  Patient Location: PACU  Anesthesia Type:General  Level of Consciousness: oriented, drowsy and patient cooperative  Airway & Oxygen Therapy: Patient Spontanous Breathing and Patient connected to face mask oxygen  Post-op Assessment: Report given to RN and Post -op Vital signs reviewed and stable  Post vital signs: Reviewed  Last Vitals:  Vitals Value Taken Time  BP 113/63 07/02/21 1652  Temp 36.2 C 07/02/21 1652  Pulse 79 07/02/21 1656  Resp 13 07/02/21 1656  SpO2 100 % 07/02/21 1656  Vitals shown include unvalidated device data.  Last Pain:  Vitals:   07/02/21 1652  TempSrc:   PainSc: Asleep      Patients Stated Pain Goal: 0 (07/01/21 1324)  Complications: No notable events documented.

## 2021-07-02 NOTE — Care Management Important Message (Signed)
Important Message  Patient Details  Name: Lonnie [name] MRN: [id number] Date of Birth: 1951/03/09   Medicare Important Message Given:  Yes  Pt. On Precautions will leave with the nusre to give to pt.   Sloan Leiter Smith 07/02/2021, 9:24 AM

## 2021-07-02 NOTE — Care Management Important Message (Signed)
Important Message  Patient Details  Name: Lonnie [name] MRN: [id number] Date of Birth: [birth year]   Medicare Important Message Given:  Yes     Renie Ora 07/02/2021, 9:28 AM

## 2021-07-02 NOTE — Anesthesia Procedure Notes (Signed)
Procedure Name: Intubation Date/Time: 07/02/2021 3:26 PM Performed by: Lovie Chol, CRNA Pre-anesthesia Checklist: Patient identified, Emergency Drugs available, Suction available and Patient being monitored Patient Re-evaluated:Patient Re-evaluated prior to induction Oxygen Delivery Method: Circle System Utilized Preoxygenation: Pre-oxygenation with 100% oxygen Induction Type: IV induction Ventilation: Mask ventilation without difficulty Laryngoscope Size: Miller and 3 Grade View: Grade I Tube type: Oral Tube size: 7.5 mm Number of attempts: 1 Airway Equipment and Method: Stylet Placement Confirmation: ETT inserted through vocal cords under direct vision, positive ETCO2 and breath sounds checked- equal and bilateral Secured at: 22 cm Tube secured with: Tape Dental Injury: Teeth and Oropharynx as per pre-operative assessment

## 2021-07-02 NOTE — Progress Notes (Signed)
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 Arlyn Dunning washout 03/12/2020 Dr. Durwin Nora Recent admission 06/07/21 occluded right femoral pop bypass status post thrombolysis mechanical thrombectomy R femoropopliteal bypass graft and stent closure with Mynx closure and discharge 6/24  Readmit 6/25 generalized weakness confusion-found to have bladder retention 900 cc possible UTI COVID-positive on admission Rx--Decadron remdesivir Started on cefepime for UTI-UTI which was unfounded on cultures- Had AKI on admission- patient failed voiding trial  Developed spontaneous bleeding from prior area of right femoropopliteal bypass 06/26/2021 and then large-volume bleeding with hypovolemic shock 06/27/2021 Taken to the OR by Dr. Chestine Spore of vascular surgery 7/7 PM--- family has been updated via vascular surgery regarding need for probable amputation  Hospital-Problem based course  Hypovolemic shock 2/2 LE bleed bleeding from area of the right femoropopliteal bypass graft/perforation Ligation in OR 06/27/2021--Amputation 1400 07/02/2021 scheduled LR/D5 50 cc/H tonight 3 units PRBC transfused 06/27/2021-hemoglobin stabilized 8-9 range Leukocytosis ?infected graft?   Antibiotics per VVS---downward trending Severe PAD and still smoker status post recent admission right femoropopliteal bypass mechanical thrombectomy 6/17 aspirin 81, Plavix 75 and statin as per VVS given bleeding Pain less with-Oxy IR increased to every 4--tramadol every 12 --Pain is moderately controlled LUTS/BPH Foley catheter placed on admission --need urologist on discharge -continue  bethanechol 10 3 times daily, Flomax 0.8 nightly Subdural + post craniectomy 06/2019 + chronic underlying mild neurological/cognitive deficits continue Celexa 20 Seroquel 100 twice daily Reorient as able-stable Metabolic encephalopathy on admission-DDx COVID versus BPH causing the same Cautious titration pain meds-risk of sedation/encephalopathy Systolic/diastolic heart failure EF 40-45% diastolic dysfunction noted Compensated-holding home Lasix at this time CABG X4 in 2021 Outpatient cardiology follow-up Antihypertensives held 7/7, 2/2 bleeding metoprolol at 12.5 twice daily currently LFTs elevated-ultrasound 6/28 steatosis-hepatitis panel negative-HIV negative Transaminitis stable [elevated since 06/16/2021] ?2/2  COVID?  drug effect ? infection-trends stable COVID-19 on 6/25-resolved Relatively asymptomatic currently-Rx earlier in hospital stay 3 days remdesivir Decadron ending on 6/29 CXR performed 7/6 shows no residual infection or inflammation Elevated D-dimer  2/2 COVID and lower extremity ultrasound was negative hypovolemic hypotonic hyponatremia-resolving Urine sodium less than 10 urine  indicating hypotonic state Lower NS 125 cc/H-->50 cc/h --periodic lab checks-has resolved   DVT prophylaxis: Lovenox Code Status: DNR Family Communication: Daughter has been updated by Dr. Chestine Spore regarding amputation- Overall stabilized ready for surgery not ready for discharge Disposition:  Status is: Inpatient  Remains inpatient appropriate because:Hemodynamically unstable, IV treatments appropriate due to intensity of illness or inability to take PO, and Inpatient level of care appropriate due to severity of illness  Dispo:  Patient From:    Planned Disposition: Skilled Nursing Facility in the next several days pending pain control in addition to placement options  Medically stable for discharge:       Consultants:  vasc  Procedures:   Antimicrobials:     Subjective:  No new  issues Overnight seems stable Going for surgery apparently at 2 PM Worries about worrying his family who have heart disease per him  Objective: Vitals:   07/01/21 1943 07/01/21 2343 07/02/21 0415 07/02/21 0729  BP: 128/81 124/78 123/70 124/72  Pulse: 68 70 63 86  Resp: 15 16  12 10  Temp: 98.6 F (37 C) 98.2 F (36.8 C) 98.6 F (37 C) 97.6 F (36.4 C)  TempSrc: Oral Oral Oral Oral  SpO2: 100% 99% 98% 99%  Weight:   78 kg   Height:        Intake/Output Summary (Last 24 hours) at 07/02/2021 0910 Last data filed at 07/02/2021 0115 Gross per 24 hour  Intake 240 ml  Output 700 ml  Net -460 ml    Filed Weights   06/29/21 0336 07/01/21 0544 07/02/21 0415  Weight: 79.8 kg 83.2 kg 78 kg    Examination:   Coherent x2 this morning EOMI NCAT no focal deficit CTA B no rales no rhonchi Abdomen soft S1-S2 holosystolic murmur-on telemetry predominant sinus rhythm Wound still present on right lower extremity   Data Reviewed: personally reviewed   CBC    Component Value Date/Time   WBC 15.7 (H) 07/02/2021 0308   RBC 3.39 (L) 07/02/2021 0308   HGB 8.6 (L) 07/02/2021 0308   HGB 15.7 01/10/2020 1146   HCT 27.6 (L) 07/02/2021 0308   HCT 46.1 01/10/2020 1146   PLT 505 (H) 07/02/2021 0308   PLT 232 01/10/2020 1146   MCV 81.4 07/02/2021 0308   MCV 83 01/10/2020 1146   MCH 25.4 (L) 07/02/2021 0308   MCHC 31.2 07/02/2021 0308   RDW 22.8 (H) 07/02/2021 0308   RDW 12.8 01/10/2020 1146   LYMPHSABS 2.1 07/02/2021 0308   LYMPHSABS 2.5 03/01/2015 1414   MONOABS 1.0 07/02/2021 0308   EOSABS 0.2 07/02/2021 0308   EOSABS 0.4 03/01/2015 1414   BASOSABS 0.1 07/02/2021 0308   BASOSABS 0.1 03/01/2015 1414   CMP Latest Ref Rng & Units 07/02/2021 07/01/2021 06/30/2021  Glucose 70 - 99 mg/dL 778(E) 423(N) 361(W)  BUN 8 - 23 mg/dL 11 11 15   Creatinine 0.61 - 1.24 mg/dL 4.31 5.40  Sodium 135 - 145 mmol/L 134(L) 135 134(L)  Potassium 3.5 - 5.1 mmol/L 4.5 4.3 4.0  Chloride 98 - 111  mmol/L 101 103 102  CO2 22 - 32 mmol/L 27 24 25   Calcium 8.9 - 10.3 mg/dL 9.3 9.1 0.86)  Total Protein 6.5 - 8.1 g/dL 6.8 ) 6.3(L)  Total Bilirubin 0.3 - 1.2 mg/dL 0.5 0.7 0.9  Alkaline Phos 38 - 126 U/L 111 120 122  AST 15 - 41 U/L 93(H) 138(H) 98(H)  ALT 0 - 44 U/L 162(H) 182(H) 142(H)     Radiology Studies: No results found.   Scheduled Meds:  sodium chloride   Intravenous Once   acetaminophen  650 mg Oral Once   aspirin EC  81 mg Oral Daily   bethanechol  10 mg Oral TID   Chlorhexidine Gluconate Cloth  6 each Topical Daily   citalopram  20 mg Oral Daily   clopidogrel  75 mg Oral Daily   diphenhydrAMINE  25 mg Oral Once   ferrous fumarate-b12-vitamic C-folic acid  1 capsule Oral Daily   furosemide  20 mg Intravenous Once   gabapentin  100 mg Oral BID   melatonin  10 mg Oral QHS   metoprolol tartrate  12.5 mg Oral BID   multivitamin with minerals  1 tablet Oral Daily   nicotine  7 mg Transdermal Daily   pantoprazole  40 mg Oral QPM   QUEtiapine  100 mg Oral BID   senna-docusate  1 tablet Oral BID   tamsulosin  0.8 mg Oral QPM   Continuous Infusions:  dextrose 5% lactated ringers 50 mL/hr  at 07/02/21 0115      LOS: 16 days   Time spent: 22  Rhetta Mura, MD [facility] To contact the attending provider between 7A-7P or the covering provider during after hours 7P-7A, please log into the web site [email or web address] and access using universal [place] password for that web site. If you do not have the password, please call the hospital operator.  07/02/2021, 9:10 AM

## 2021-07-02 NOTE — Interval H&P Note (Signed)
History and Physical Interval Note:  07/02/2021 2:32 PM  Lonnie [name]  has presented today for surgery, with the diagnosis of RIGHT FOOT WOUND.  The various methods of treatment have been discussed with the patient and family. After consideration of risks, benefits and other options for treatment, the patient has consented to  Procedure(s): RIGHT ABOVE KNEE AMPUTATION (Right) as a surgical intervention.  The patient's history has been reviewed, patient examined, no change in status, stable for surgery.  I have reviewed the patient's chart and labs.  Questions were answered to the patient's satisfaction.     Fabienne Bruns

## 2021-07-02 NOTE — Anesthesia Preprocedure Evaluation (Addendum)
Anesthesia Evaluation  Patient identified by MRN, date of birth, ID band Patient awake    Reviewed: Allergy & Precautions, NPO status , Patient's Chart, lab work & pertinent test results, reviewed documented beta blocker date and time   Airway Mallampati: II  TM Distance: >3 FB Neck ROM: Full    Dental  (+) Teeth Intact, Dental Advisory Given   Pulmonary Current Smoker and Patient abstained from smoking.,    Pulmonary exam normal breath sounds clear to auscultation       Cardiovascular hypertension, Pt. on medications and Pt. on home beta blockers + CAD, + Past MI, + CABG, + Peripheral Vascular Disease and +CHF  Normal cardiovascular exam Rhythm:Regular Rate:Normal     Neuro/Psych PSYCHIATRIC DISORDERS Anxiety Depression Dementia  Neuromuscular disease CVA    GI/Hepatic Neg liver ROS, GERD  Medicated,  Endo/Other  negative endocrine ROS  Renal/GU negative Renal ROS     Musculoskeletal  (+) Arthritis , RIGHT FOOT WOUND   Abdominal   Peds  Hematology  (+) Blood dyscrasia (Plavix), anemia ,   Anesthesia Other Findings Day of surgery medications reviewed with the patient.  Reproductive/Obstetrics                             Anesthesia Physical Anesthesia Plan  ASA: 4  Anesthesia Plan: General   Post-op Pain Management:    Induction: Intravenous  PONV Risk Score and Plan: 1 and Dexamethasone and Ondansetron  Airway Management Planned: Oral ETT  Additional Equipment:   Intra-op Plan:   Post-operative Plan: Extubation in OR  Informed Consent: I have reviewed the patients History and Physical, chart, labs and discussed the procedure including the risks, benefits and alternatives for the proposed anesthesia with the patient or authorized representative who has indicated his/her understanding and acceptance.   Patient has DNR.  Discussed DNR with patient and Suspend DNR.   Dental  advisory given  Plan Discussed with: CRNA  Anesthesia Plan Comments:         Anesthesia Quick Evaluation

## 2021-07-02 NOTE — Progress Notes (Signed)
VAC discontinued----1 Black foam removed d/t blockage alarm continuosly going off with VAC reading 81mm/hg suction and the inability to acheive target pressure of 153mm/hg. wound cleansed with saline, moist kerlex placed in wound with 2 ABD's to cover..secured with ACE wrap. Pt. is going to have Rt. AKA today at 1400. Pt. Tolerated dressing change well. Will continue to monitor closely

## 2021-07-02 NOTE — Progress Notes (Signed)
Called earlier today for change in vascular exam left foot.  We were able to find a monophasic PT doppler signal in left foot and this was marked.  Pt is asymptomatic with no pain or wounds on left foot.  Fabienne Bruns, MD [facility] [place] Office: [phone]

## 2021-07-02 NOTE — Progress Notes (Signed)
[facility] [place]  Subjective  -having significant pain in the right foot   Objective 123/70 63 98.6 F (37 C) (Oral) 12 98%  Intake/Output Summary (Last 24 hours) at 07/02/2021 0551 Last data filed at 07/02/2021 0115 Gross per 24 hour  Intake 1200 ml  Output 700 ml  Net 500 ml    Right foot with extensive right dorsal wound  Laboratory Lab Results: Recent Labs    07/01/21 0123 07/02/21 0308  WBC 18.7* 15.7*  HGB 8.6* 8.6*  HCT 27.5* 27.6*  PLT 556* 505*   BMET Recent Labs    07/01/21 0123 07/02/21 0308  NA 135 134*  K 4.3 4.5  CL 103 101  CO2 24 27  GLUCOSE 105* 139*  BUN 11 11  CREATININE 0.72 0.80  CALCIUM 9.1 9.3    COAG Lab Results  Component Value Date   INR 1.1 06/27/2021   INR 1.1 09/10/2020   INR 1.0 03/01/2020   No results found for: PTT  Assessment/Planning:  70 year old male scheduled for right above-knee amputation today with Dr. Darrick Penna.  Ultimately I took him to the OR last Thursday and explored his below-knee popliteal wound where he had disruption of the distal graft and Viabahn stent and the bypass appeared occluded (this was in setting of bleeding after recent lysis of bypass graft).  As Dr. Myra Gianotti previously stated no further options for reconstruction.  I discussed with his daughter yesterday.  He is amendable to proceed today.  Cephus Shelling 07/02/2021 5:51 AM --

## 2021-07-02 NOTE — H&P (View-Only) (Signed)
[facility] [place]  Subjective  -having significant pain in the right foot   Objective 123/70 63 98.6 F (37 C) (Oral) 12 98%  Intake/Output Summary (Last 24 hours) at 07/02/2021 0551 Last data filed at 07/02/2021 0115 Gross per 24 hour  Intake 1200 ml  Output 700 ml  Net 500 ml    Right foot with extensive right dorsal wound  Laboratory Lab Results: Recent Labs    07/01/21 0123 07/02/21 0308  WBC 18.7* 15.7*  HGB 8.6* 8.6*  HCT 27.5* 27.6*  PLT 556* 505*   BMET Recent Labs    07/01/21 0123 07/02/21 0308  NA 135 134*  K 4.3 4.5  CL 103 101  CO2 24 27  GLUCOSE 105* 139*  BUN 11 11  CREATININE 0.72 0.80  CALCIUM 9.1 9.3    COAG Lab Results  Component Value Date   INR 1.1 06/27/2021   INR 1.1 09/10/2020   INR 1.0 03/01/2020   No results found for: PTT  Assessment/Planning:  70-year-old male scheduled for right above-knee amputation today with Dr. Fields.  Ultimately I took him to the OR last Thursday and explored his below-knee popliteal wound where he had disruption of the distal graft and Viabahn stent and the bypass appeared occluded (this was in setting of bleeding after recent lysis of bypass graft).  As Dr. Brabham previously stated no further options for reconstruction.  I discussed with his daughter yesterday.  He is amendable to proceed today.  Lonnie [name] 07/02/2021 5:51 AM --   

## 2021-07-02 NOTE — Op Note (Signed)
Procedure: Right above-knee amputation  Preoperative diagnosis: Gangrene right foot  Postoperative diagnosis: Same  Anesthesia: General  Assistant: Doreatha Massed, PA-C to expedite procedure  Operative findings: Occluded prosthetic bypass graft  Specimens: Graft segment for culture  Operative details: After obtaining informed consent, the patient was taken the operating.  The patient was placed in the supine position on the operating table.  After induction general anesthesia patient's right lower extremities prepped and draped in usual sterile fashion.  Next a circumferential incision was made on the right leg just above the knee.  Incision was carried on through the subcutaneous tissues down the level of fascia.  The muscle and fascia were all incised.  Upon dividing the sartorius muscle a prosthetic ring supported bypass graft was encountered.  There was some murky fluid around this.  A segment of it was sent for culture.  This was dissected free circumferentially and clamped proximally and distally and divided.  The native artery and vein were severely scarred in.  These were dissected free as a unit and clamped proximally distally and divided.  They were suture ligated proximally as well as the bypass graft.  This was done with a 2-0 silk stitch.  The sciatic nerve was pulled down in the operative field clamped high and transected and ligated with a 2-0 Vicryl tie.  The remainder of soft tissues were taken down with cautery.  Periosteum was raised on the femur about 5 cm above the skin edge.  The femur was then divided with a saw.  The wound was thoroughly irrigated with 4 L of normal saline solution.  The fascial edges were reapproximated using interrupted 2-0 Vicryl sutures.  The subcutaneous tissues were reapproximated using a running 3-0 Vicryl suture.  The skin was closed with staples.  The patient tolerated procedure well and there were no complications.  The instrument sponge and needle  count was correct the end of the case.  Fabienne Bruns, MD [facility] [place] Office: [phone]

## 2021-07-02 NOTE — Anesthesia Postprocedure Evaluation (Signed)
Anesthesia Post Note  Patient: Lonnie [name]  Procedure(s) Performed: RIGHT ABOVE KNEE AMPUTATION (Right: Knee)     Patient location during evaluation: PACU Anesthesia Type: General Level of consciousness: awake and alert Pain management: pain level controlled Vital Signs Assessment: post-procedure vital signs reviewed and stable Respiratory status: spontaneous breathing, nonlabored ventilation and respiratory function stable Cardiovascular status: blood pressure returned to baseline and stable Postop Assessment: no apparent nausea or vomiting Anesthetic complications: no   No notable events documented.  Last Vitals:  Vitals:   07/02/21 1652 07/02/21 1706  BP: 113/63 122/62  Pulse: 76 72  Resp: 14 13  Temp: (!) 36.2 C   SpO2: 100% 100%    Last Pain:  Vitals:   07/02/21 1706  TempSrc:   PainSc: 0-No pain                 Lucretia Kern

## 2021-07-03 ENCOUNTER — Encounter (HOSPITAL_COMMUNITY): Payer: Self-pay | Admitting: Vascular Surgery

## 2021-07-03 DIAGNOSIS — Z89611 Acquired absence of right leg above knee: Secondary | ICD-10-CM

## 2021-07-03 LAB — BASIC METABOLIC PANEL
Anion gap: 10 (ref 5–15)
BUN: 11 mg/dL (ref 8–23)
CO2: 23 mmol/L (ref 22–32)
Calcium: 9.1 mg/dL (ref 8.9–10.3)
Chloride: 100 mmol/L (ref 98–111)
Creatinine, Ser: 0.63 mg/dL (ref 0.61–1.24)
GFR, Estimated: >60 mL/min (ref >60)
Glucose, Bld: 123 mg/dL — ABNORMAL HIGH (ref 70–99)
Potassium: 4.4 mmol/L (ref 3.5–5.1)
Sodium: 133 mmol/L — ABNORMAL LOW (ref 135–145)

## 2021-07-03 LAB — CBC
HCT: 25.6 % — ABNORMAL LOW (ref 39.0–52.0)
Hemoglobin: 7.9 g/dL — ABNORMAL LOW (ref 13.0–17.0)
MCH: 25.3 pg — ABNORMAL LOW (ref 26.0–34.0)
MCHC: 30.9 g/dL (ref 30.0–36.0)
MCV: 82.1 fL (ref 80.0–100.0)
Platelets: 475 10*3/uL — ABNORMAL HIGH (ref 150–400)
RBC: 3.12 MIL/uL — ABNORMAL LOW (ref 4.22–5.81)
RDW: 22.5 % — ABNORMAL HIGH (ref 11.5–15.5)
WBC: 19.8 10*3/uL — ABNORMAL HIGH (ref 4.0–10.5)
nRBC: 0 % (ref 0.0–0.2)

## 2021-07-03 MED ORDER — GABAPENTIN 100 MG PO CAPS: 200.0000 mg | ORAL_CAPSULE | Freq: Every day | ORAL | Status: DC

## 2021-07-03 MED ORDER — GABAPENTIN 300 MG PO CAPS
300.0000 mg | ORAL_CAPSULE | Freq: Every day | ORAL | Status: DC
  Administered 2021-07-03 – 2021-07-12 (×10): 300 mg via ORAL
  Filled 2021-07-03 (×10): qty 1

## 2021-07-03 MED ORDER — GABAPENTIN 100 MG PO CAPS
100.0000 mg | ORAL_CAPSULE | Freq: Every day | ORAL | Status: DC
  Administered 2021-07-04 – 2021-07-12 (×9): 100 mg via ORAL
  Filled 2021-07-03 (×9): qty 1

## 2021-07-03 NOTE — Progress Notes (Signed)
Inpatient Rehab Admissions Coordinator:    CIR consult received. Both PT and OT are recommending SNF at this time. I am in agreement and will not pursue CIR for this PT. CIR to sign off.   Megan Salon, MS, CCC-SLP Rehab Admissions Coordinator  [phone] (celll) [phone] (office)

## 2021-07-03 NOTE — Progress Notes (Addendum)
Occupational Therapy Treatment/Re-eval Patient Details Name: RAYNER ERMAN MRN: [id number] DOB: [birth year] Today's Date: 07/03/2021    History of present illness NAVEEN CLARDY is a 70 y.o. male who underwent peripheral vascular thrombectomy of R LE 6/17 and Was discharged to SNF.  Pt readmitted 6/25 due to AMS, AKI, metabolic encephalopathy.  He was found to be COVID +.  He underwent exploration of Rt below knee popliteal wound and noted with  acute ischemic wounds and gangrene Rt foot.  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 Medical history significant for dementia, peripheral artery disease, tobacco use disorder, chronic depression/anxiety, iron deficiency anemia, peripheral neuropathy, essential hypertension, hyperlipidemia, GERD, BPH, falls, SDH.   OT comments  Pt seen for first time since R AKA, motivated to attempt OOB activities. Pt overall Supervision for bed mobility and Mod A x 2 for stand pivot using RW to recliner. Pt benefits from cues for sequencing and safety awareness. Pt continues to require Min A for UB ADLs and up to Total A for LB ADLs. Plan to progress BSC transfers and compensatory strategies for LB ADLs in next session. Continue to recommend SNF rehab.   Follow Up Recommendations  SNF;Supervision/Assistance - 24 hour    Equipment Recommendations  3 in 1 bedside commode;Wheelchair (measurements OT);Wheelchair cushion (measurements OT)    Recommendations for Other Services      Precautions / Restrictions Precautions Precautions: Fall Precaution Comments: impaired vision Restrictions Weight Bearing Restrictions: No       Mobility Bed Mobility Overal bed mobility: Needs Assistance Bed Mobility: Supine to Sit     Supine to sit: Supervision;HOB elevated     General bed mobility comments: no assist needed, able to come to long sitting without assist. cues for scooting hips forward    Transfers Overall  transfer level: Needs assistance Equipment used: Rolling walker (2 wheeled) Transfers: Sit to/from [employer] Sit to Stand: Min assist;+2 physical assistance;+2 safety/equipment;From elevated surface Stand pivot transfers: Mod assist;+2 physical assistance;+2 safety/equipment       General transfer comment: Min A x 2 for power up from bedside, cues for hand placement. Mod A x 2 for maintaining balance, manuevering RW and cues for sequencing transfer safely    Balance Overall balance assessment: Needs assistance Sitting-balance support: No upper extremity supported;Feet supported Sitting balance-Leahy Scale: Good     Standing balance support: Bilateral upper extremity supported;Single extremity supported;During functional activity Standing balance-Leahy Scale: Poor Standing balance comment: reliant on UE support due to R AKA + external support                           ADL either performed or assessed with clinical judgement   ADL Overall ADL's : Needs assistance/impaired                     Lower Body Dressing: Maximal assistance;Sitting/lateral leans;Bed level Lower Body Dressing Details (indicate cue type and reason): Likely assist at baseline for donning socks. Would need +2 assist if LB dressing attempted in standing               General ADL Comments: Limited somewhat by pain, expected balance impairments s/p AKA.     Vision   Vision Assessment?: Vision impaired- to be further tested in functional context   Perception     Praxis      Cognition Arousal/Alertness: Awake/alert Behavior During Therapy: [facility] for  tasks assessed/performed Overall Cognitive Status: History of cognitive impairments - at baseline Area of Impairment: Attention;Memory;Problem solving;Awareness;Following commands;Safety/judgement                   Current Attention Level: Selective Memory: Decreased short-term memory Following Commands: Follows  one step commands with increased time;Follows multi-step commands inconsistently;Follows multi-step commands with increased time Safety/Judgement: Decreased awareness of safety;Decreased awareness of deficits Awareness: Emergent Problem Solving: Slow processing;Decreased initiation;Requires verbal cues;Difficulty sequencing;Requires tactile cues General Comments: Hx of dementia, pleasant and talkative, increased time for following directions, safety and awareness. brief bout of agitation possibly due to pain, confusion with directions        Exercises     Shoulder Instructions       General Comments VSS on RA    Pertinent Vitals/ Pain       Pain Assessment: 0-10 Pain Score: 5  Pain Location: R residual limb Pain Descriptors / Indicators: Sore;Grimacing;Guarding Pain Intervention(s): Monitored during session;Premedicated before session  Home Living                                          Prior Functioning/Environment              Frequency  Min 2X/week        Progress Toward Goals  OT Goals(current goals can now be found in the care plan section)  Progress towards OT goals: OT to reassess next treatment  Acute Rehab OT Goals Patient Stated Goal: go home OT Goal Formulation: With patient Time For Goal Achievement: 07/17/21 Potential to Achieve Goals: Good ADL Goals Pt Will Transfer to Toilet: with min assist;squat pivot transfer;stand pivot transfer;bedside commode Pt Will Perform Toileting - Clothing Manipulation and hygiene: with min assist;sitting/lateral leans Additional ADL Goal #1: Pt to complete sit to stand transfers with Min A in prep for ADL tasks  Plan Discharge plan remains appropriate    Co-evaluation    PT/OT/SLP Co-Evaluation/Treatment: Yes Reason for Co-Treatment: For patient/therapist safety;To address functional/ADL transfers   OT goals addressed during session: ADL's and self-care;Strengthening/ROM      AM-PAC OT "6  Clicks" Daily Activity     Outcome Measure   Help from another person eating meals?: None Help from another person taking care of personal grooming?: A Little Help from another person toileting, which includes using toliet, bedpan, or urinal?: Total Help from another person bathing (including washing, rinsing, drying)?: A Lot Help from another person to put on and taking off regular upper body clothing?: A Little Help from another person to put on and taking off regular lower body clothing?: A Lot 6 Click Score: 15    End of Session Equipment Utilized During Treatment: Gait belt;Rolling walker  OT Visit Diagnosis: Unsteadiness on feet (R26.81);Other abnormalities of gait and mobility (R26.89);Muscle weakness (generalized) (M62.81);Pain;Other symptoms and signs involving cognitive function Pain - Right/Left: Right Pain - part of body: Leg   Activity Tolerance Patient tolerated treatment well   Patient Left in chair;with call bell/phone within reach;with chair alarm set   Nurse Communication Mobility status        Time: 6384-5364 OT Time Calculation (min): 27 min  Charges: OT General Charges $OT Visit: 1 Visit OT Evaluation $OT Re-eval: 1 Re-eval  Bradd Canary, OTR/L Acute Rehab Services Office: [phone]    Lorre Munroe 07/03/2021, 10:23 AM

## 2021-07-03 NOTE — Progress Notes (Signed)
PROGRESS NOTE    Lonnie [name]  NID:[id number] DOB: 1951/12/10 DOA: 06/15/2021 PCP: [name], MD   No chief complaint on file.  Brief Narrative:  70 yo with hx CAD s/p CABG x4 2014, subdural hematoma s/p craniotomy (2020, with residual balance deficits and delayed processing), ischemic cardiomyopathy, right popliteal bypass with PTFE graft 01/23/2020, recent admission 06/07/21 with occluded R fem pop bypass s/p thrombolysis, mechanical thrombectomy of R femoropoliteal bypass graft, stent x2 of the right femoral bypass graft.  He was readmitted 6/25 with weakness and confusion and found to have bladder rentention and possible UTI (multiple species).  His covid testing was positive and he was treated with steroids and remdesivir.  He developed spontaneous bleeding on 7/6 from prior area of R femoropopliteal bypass and then large volume bleeding with hemorrhagic shock on 7/7.  He was taken to the OR by Dr. Chestine Spore on 7/7 for exploration of R below knee popliteal wound, repair of R popliteal vein, ligation of R popliteal artery, and placement of negative pressure wound vac.  He had right AKA on 7/12 with gangrene of R foot.  Assessment & Plan:   Active Problems:   Acute metabolic encephalopathy   AKI (acute kidney injury) (HCC)   Acute Blood Loss Anemia with Hypovolemic Shock Occluded Right Femoropopliteal Bypass s/p lysis complicated by bleeding with disruption and reocclusion of graft S/p right above knee amputation  S/p exploration of R below knee popliteal wound, repair of R popliteal vein, ligation of right popliteal artery and placement of negative pressure wound vac on 7/7 S/p right AKA 7/12 Follow wound cx/gram stain from OR, abx per vascular - ancef S/p 2 units pRBC (I see 3 units ordered, only 2 documented as being completed, 3rd as "transfusing" - unclear) Continue aspirin, plavix Gabapentin for phantom pain  Hb relatively stable today, follow  Leukocytosis "Murky" fluid noted  during AKA Follow cultures Abx, ancef per vascular  Severe PAD  CAD s/p CABG x4 Aspirin, plavix Not currently on statin, unclear reason, will review Continue metoprolol F/u with cardiology and vascular outpatient   LUTS  BPH S/p foley on admission Needs urology f/u outpatient Flomax, bethanecol  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 Celexa 20 mg, seroquel 100 mg BID  Acute Metabolice Encephalopathy Thought related to covid vs urinary retention? Delirium precautions  Systolic  Diastolic HF Appears compensated, follow   Elevated LFT's  Hepatic Steatosis Unclear etiology - COVID? Steatosis? Hemodynamic shifts? Negative acute hepatitis panel  COVID 19 Virus infection S/p remdesivir/decadron  DVT prophylaxis: SCD Code Status: dnr Family Communication: none at bedside Disposition:   Status is: Inpatient  Remains inpatient appropriate because:Inpatient level of care appropriate due to severity of illness  Dispo:  Patient From:    Planned Disposition: Skilled Nursing Facility  Medically stable for discharge:           Consultants:  vascular  Procedures:  LE [place] Summary:  RIGHT:  - There is no evidence of deep vein thrombosis in the lower extremity.     - No cystic structure found in the popliteal fossa.     - Incidental: Extensive fluid surrounding stent at popliteal fossa. Flow  is identified within the stent at the level of the popliteal.   - Ultrasound characteristics of enlarged lymph nodes are noted in the  groin.     LEFT:  - There is no evidence of deep vein thrombosis in the lower extremity.     - No  cystic structure found in the popliteal fossa.      Antimicrobials:  Anti-infectives (From admission, onward)    Start     Dose/Rate Route Frequency Ordered Stop   07/02/21 2200  ceFAZolin (ANCEF) IVPB 1 g/50 mL premix        1 g 100 mL/hr over 30 Minutes Intravenous Every 8 hours 07/02/21 1816      06/17/21 1400  ceFEPIme (MAXIPIME) 2 g in sodium chloride 0.9 % 100 mL IVPB        2 g 200 mL/hr over 30 Minutes Intravenous Every 12 hours 06/17/21 1258 06/19/21 0955   06/17/21 1000  remdesivir 100 mg in sodium chloride 0.9 % 100 mL IVPB       See Hyperspace for full Linked Orders Report.   100 mg 200 mL/hr over 30 Minutes Intravenous Daily 06/16/21 0135 06/18/21 1026   06/16/21 2200  cefTRIAXone (ROCEPHIN) 2 g in sodium chloride 0.9 % 100 mL IVPB  Status:  Discontinued        2 g 200 mL/hr over 30 Minutes Intravenous Every 24 hours 06/15/21 2304 06/17/21 1218   06/16/21 2200  vancomycin (VANCOREADY) IVPB 1000 mg/200 mL  Status:  Discontinued        1,000 mg 200 mL/hr over 60 Minutes Intravenous Every 12 hours 06/16/21 0820 06/17/21 1218   06/16/21 0915  vancomycin (VANCOREADY) IVPB 1750 mg/350 mL        1,750 mg 175 mL/hr over 120 Minutes Intravenous  Once 06/16/21 0820 06/16/21 1114   06/16/21 0230  remdesivir 200 mg in sodium chloride 0.9% 250 mL IVPB       See Hyperspace for full Linked Orders Report.   200 mg 580 mL/hr over 30 Minutes Intravenous Once 06/16/21 0135 06/16/21 0324   06/15/21 2230  linezolid (ZYVOX) IVPB 600 mg  Status:  Discontinued        600 mg 300 mL/hr over 60 Minutes Intravenous Every 12 hours 06/15/21 2228 06/16/21 0811   06/15/21 2115  cefTRIAXone (ROCEPHIN) 2 g in sodium chloride 0.9 % 100 mL IVPB        2 g 200 mL/hr over 30 Minutes Intravenous  Once 06/15/21 2114 06/15/21 2328          Subjective: C/o phantom pain  Objective: Vitals:   07/03/21 0741 07/03/21 1200 07/03/21 1500 07/03/21 1600  BP: (!) 127/52 117/61 (!) 143/72 126/71  Pulse: 75 63 71 69  Resp: 13 17 14 16   Temp: 98.3 F (36.8 C) 98.6 F (37 C) 98.2 F (36.8 C) 98.3 F (36.8 C)  TempSrc: Oral Oral Oral Oral  SpO2: 100% 100% 100% 98%  Weight:      Height:        Intake/Output Summary (Last 24 hours) at 07/03/2021 1936 Last data filed at 07/03/2021 1700 Gross per 24 hour   Intake 1453.49 ml  Output 2250 ml  Net -796.51 ml   Filed Weights   07/01/21 0544 07/02/21 0415 07/03/21 0300  Weight: 83.2 kg 78 kg 73.9 kg    Examination:  General exam: Appears calm and comfortable  Respiratory system: Clear to auscultation. Respiratory effort normal. Cardiovascular system: S1 & S2 heard, RRR Gastrointestinal system: Abdomen is nondistended, soft and nontender Central nervous system: Alert and oriented. No focal neurological deficits. Extremities: R AKA with intact dressing Psychiatry: Judgement and insight appear normal. Mood & affect appropriate.   Data Reviewed: I have personally reviewed following labs and imaging studies  CBC: Recent Labs  Lab  06/28/21 [id number] 06/30/21 0243 07/01/21 0123 07/02/21 0308 07/03/21 0209  WBC 20.0* 17.5* 18.7* 15.7* 19.8*  NEUTROABS 15.4* 12.3* 14.7* 12.1*  --   HGB 9.4* 8.4* 8.6* 8.6* 7.9*  HCT 27.9* 25.9* 27.5* 27.6* 25.6*  MCV 78.2* 79.7* 81.6 81.4 82.1  PLT 565* 527* 556* 505* 475*    Basic Metabolic Panel: Recent Labs  Lab 06/27/21 0159 06/27/21 1832 06/29/21 0508 06/30/21 0243 07/01/21 0123 07/02/21 0308 07/03/21 0209  NA 129*   < > 134* 134* 135 134* 133*  K 4.4   < > 3.9 4.0 4.3 4.5 4.4  CL 99   < > 103 102 103 101 100  CO2 22  --  24 25 24 27 23   GLUCOSE 126*   < > 106* 121* 105* 139* 123*  BUN 12   < > 12 15 11 11 11   CREATININE 0.76   < > 0.71  0.75 0.74 0.72 0.80 0.63  CALCIUM 9.1  --  8.4* 8.8* 9.1 9.3 9.1  PHOS 3.9  --   --   --   --   --   --    < > = values in this interval not displayed.    GFR: Estimated Creatinine Clearance: 92.4 mL/min (by C-G formula based on SCr of 0.63 mg/dL).  Liver Function Tests: Recent Labs  Lab 06/27/21 0159 06/29/21 0508 06/30/21 0243 07/01/21 0123 07/02/21 0308  AST  --  121* 98* 138* 93*  ALT  --  138* 142* 182* 162*  ALKPHOS  --  136* 122 120 111  BILITOT  --  0.6 0.9 0.7 0.5  PROT  --  6.1* 6.3* 6.4* 6.8  ALBUMIN 2.4* 2.3* 2.4* 2.3* 2.4*     CBG: Recent Labs  Lab 06/27/21 1917  GLUCAP 139*     Recent Results (from the past 240 hour(s))  Aerobic Culture w Gram Stain (superficial specimen)     Status: None (Preliminary result)   Collection Time: 07/02/21  4:13 PM   Specimen: PATH Other; Tissue  Result Value Ref Range Status   Specimen Description WOUND RIGHT LEG  Final   Special Requests BYPASS  GRAFT PT ON ANCEF  Final   Gram Stain   Final    MODERATE WBC PRESENT,BOTH PMN AND MONONUCLEAR NO ORGANISMS SEEN    Culture   Final    TOO YOUNG TO READ Performed at [facility], [address] [place], [place] [address]    Report Status PENDING  Incomplete         Radiology Studies: No results found.      Scheduled Meds:  sodium chloride   Intravenous Once   acetaminophen  650 mg Oral Once   aspirin EC  81 mg Oral Daily   bethanechol  10 mg Oral TID   Chlorhexidine Gluconate Cloth  6 each Topical Daily   citalopram  20 mg Oral Daily   clopidogrel  75 mg Oral Daily   diphenhydrAMINE  25 mg Oral Once   ferrous fumarate-b12-vitamic C-folic acid  1 capsule Oral Daily   gabapentin  100 mg Oral BID   gabapentin  200 mg Oral QHS   melatonin  10 mg Oral QHS   metoprolol tartrate  12.5 mg Oral BID   multivitamin with minerals  1 tablet Oral Daily   nicotine  7 mg Transdermal Daily   pantoprazole  40 mg Oral QPM   QUEtiapine  100 mg Oral BID   senna-docusate  1 tablet  Oral BID   tamsulosin  0.8 mg Oral QPM   Continuous Infusions:   ceFAZolin (ANCEF) IV 1 g (07/03/21 1500)   dextrose 5% lactated ringers 50 mL/hr at 07/03/21 0600     LOS: 17 days    Time spent: over 30 min    Lacretia Nicks, MD [facility]   To contact the attending provider between 7A-7P or the covering provider during after hours 7P-7A, please log into the web site [email or web address] and access using universal [place] password for that web site. If you do not have the password, please call the hospital  operator.  07/03/2021, 7:36 PM

## 2021-07-03 NOTE — Evaluation (Signed)
Physical Therapy Evaluation Patient Details Name: Lonnie [name] MRN: [id number] DOB: October 06, 1951 Today's Date: 07/03/2021   History of Present Illness  Lonnie [name] is a 70 y.o. male who underwent peripheral vascular thrombectomy of R LE 6/17 and Was discharged to SNF.  Pt readmitted 6/25 due to AMS, AKI, metabolic encephalopathy.  He was found to be COVID +.  He underwent exploration of Rt below knee popliteal wound and noted with  acute ischemic wounds and gangrene Rt foot.  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 Medical history significant for dementia, peripheral artery disease, tobacco use disorder, chronic depression/anxiety, iron deficiency anemia, peripheral neuropathy, essential hypertension, hyperlipidemia, GERD, BPH, falls, SDH.   Clinical Impression  Patient received in bed, he is pleasant and agreeable to PT assessment. Patient is mod independent with bed mobility and transfers with min +2 assist from elevated bed. Patient requires +2 mod assist for stand pivot transfer from bed to recliner. Cues for posture, use of AD needed. He will continue to benefit from skilled PT while here to improve functional independence and safety with mobility.     Follow Up Recommendations SNF;Supervision/Assistance - 24 hour    Equipment Recommendations  None recommended by PT;Other (comment) (TBD)    Recommendations for Other Services       Precautions / Restrictions Precautions Precautions: Fall Precaution Comments: impaired vision Restrictions Weight Bearing Restrictions: Yes RLE Weight Bearing: Non weight bearing      Mobility  Bed Mobility Overal bed mobility: Needs Assistance Bed Mobility: Supine to Sit     Supine to sit: Supervision;HOB elevated     General bed mobility comments: no assist needed, able to come to long sitting without assist. cues for scooting hips forward    Transfers Overall transfer level: Needs  assistance Equipment used: Rolling walker (2 wheeled) Transfers: Sit to/from [employer] Sit to Stand: Min assist;+2 physical assistance;From elevated surface;+2 safety/equipment Stand pivot transfers: Mod assist;+2 physical assistance;+2 safety/equipment       General transfer comment: Min A x 2 for power up from bedside, cues for hand placement. Mod A x 2 for maintaining balance, manuevering RW and cues for sequencing transfer safely  Ambulation/Gait         Gait velocity: increased time needed for mobility in standing   General Gait Details: unable to ambulate this date. Attempted to hop, but not very successful  Information systems manager [place] (Stroke Patients Only)       Balance Overall balance assessment: Needs assistance Sitting-balance support: Feet supported Sitting balance-Leahy Scale: Good Sitting balance - Comments: scooting no LOB   Standing balance support: Bilateral upper extremity supported;Single extremity supported;During functional activity Standing balance-Leahy Scale: Poor Standing balance comment: reliant on UE support due to R AKA + external support                             Pertinent Vitals/Pain Pain Assessment: 0-10 Pain Score: 5  Pain Location: R residual limb Pain Descriptors / Indicators: Sore;Grimacing;Guarding Pain Intervention(s): Limited activity within patient's tolerance;Monitored during session;Repositioned    Home Living Family/patient expects to be discharged to:: Skilled nursing facility                 Additional Comments: PCA 3x/wk 1-3:30PM per pt    Prior Function Level of Independence: Needs  assistance   Gait / Transfers Assistance Needed: able to mobilize with RW in the home prior to admission  ADL's / Homemaking Assistance Needed: PCA assists with shower transfers, bathing and LB dressing tasks. pt reports difficulty reaching B feet        Hand  Dominance   Dominant Hand: Right    Extremity/Trunk Assessment   Upper Extremity Assessment Upper Extremity Assessment: Defer to OT evaluation    Lower Extremity Assessment Lower Extremity Assessment: RLE deficits/detail RLE: Unable to fully assess due to pain    Cervical / Trunk Assessment Cervical / Trunk Assessment: Normal  Communication   Communication: No difficulties  Cognition Arousal/Alertness: Awake/alert Behavior During Therapy: WFL for tasks assessed/performed Overall Cognitive Status: History of cognitive impairments - at baseline Area of Impairment: Safety/judgement;Following commands                   Current Attention Level: Selective Memory: Decreased short-term memory Following Commands: Follows one step commands with increased time;Follows multi-step commands inconsistently;Follows multi-step commands with increased time Safety/Judgement: Decreased awareness of safety;Decreased awareness of deficits Awareness: Emergent Problem Solving: Slow processing;Decreased initiation;Requires verbal cues;Difficulty sequencing;Requires tactile cues General Comments: Hx of dementia, pleasant and talkative, increased time for following directions, safety and awareness. brief bout of agitation possibly due to pain, confusion with directions      General Comments General comments (skin integrity, edema, etc.): VSS on RA    Exercises     Assessment/Plan    PT Assessment Patient needs continued PT services  PT Problem List Decreased strength;Decreased mobility;Decreased safety awareness;Decreased activity tolerance;Decreased balance;Pain;Decreased knowledge of use of DME;Decreased cognition;Decreased coordination       PT Treatment Interventions DME instruction;Gait training;Functional mobility training;Therapeutic activities;Patient/family education;Balance training;Therapeutic exercise    PT Goals (Current goals can be found in the Care Plan section)  Acute  Rehab PT Goals Patient Stated Goal: go home PT Goal Formulation: With patient Time For Goal Achievement: 07/17/21 Potential to Achieve Goals: Fair    Frequency Min 2X/week   Barriers to discharge Inaccessible home environment;Decreased caregiver support      Co-evaluation PT/OT/SLP Co-Evaluation/Treatment: Yes Reason for Co-Treatment: For patient/therapist safety;Necessary to address cognition/behavior during functional activity PT goals addressed during session: Mobility/safety with mobility;Balance;Proper use of DME OT goals addressed during session: ADL's and self-care;Strengthening/ROM       AM-PAC PT "6 Clicks" Mobility  Outcome Measure Help needed turning from your back to your side while in a flat bed without using bedrails?: None Help needed moving from lying on your back to sitting on the side of a flat bed without using bedrails?: None Help needed moving to and from a bed to a chair (including a wheelchair)?: A Lot Help needed standing up from a chair using your arms (e.g., wheelchair or bedside chair)?: A Lot Help needed to walk in hospital room?: Total Help needed climbing 3-5 steps with a railing? : Total 6 Click Score: 14    End of Session Equipment Utilized During Treatment: Gait belt Activity Tolerance: Patient limited by fatigue;Patient limited by pain Patient left: in chair;with call bell/phone within reach;with chair alarm set Nurse Communication: Mobility status;Need for lift equipment PT Visit Diagnosis: Other abnormalities of gait and mobility (R26.89);Muscle weakness (generalized) (M62.81);Pain;Difficulty in walking, not elsewhere classified (R26.2);Unsteadiness on feet (R26.81) Pain - Right/Left: Right Pain - part of body: Leg    Time: 9326-7124 PT Time Calculation (min) (ACUTE ONLY): 24 min   Charges:   PT Evaluation $PT Eval Moderate Complexity: 1  Mod          [employer], PT, GCS 07/03/21,10:51 AM

## 2021-07-03 NOTE — Progress Notes (Signed)
Mobility Specialist: Progress Note   07/03/21 1507  Mobility  Activity  (Stood at Chair)  Level of Assistance Moderate assist, patient does 50-74%  Assistive Device Stedy  Mobility Out of bed to chair with meals  Mobility Response Tolerated well  Mobility performed by Mobility specialist  Bed Position Chair  $Mobility charge 1 Mobility   Post-Mobility: 71 HR, 143/72 BP, 100% SpO2  Pt agreeable to session upon entering room. Pt required verbal cues for hand placement for the Stedy and modA to stand from chair. Pt was able to stand for roughly 30 seconds during the first bout of standing and 20 seconds during the second bout with c/o pain in his R stump he rated 8-9/10. Pt back in the recliner after session with chair alarm on and call bell and phone in reach. Pt requesting pain medication, RN present.   [facility] Hosea [place] Mobility Specialist Mobility Specialist Phone: [phone]

## 2021-07-03 NOTE — Progress Notes (Addendum)
  Progress Note    07/03/2021 8:09 AM 1 Day Post-Op  Subjective:  "I feel crappy"  says his leg also feels crappy but better than before surgery. Wants to know when he is going home.   Afebrile   Vitals:   07/03/21 0300 07/03/21 0741  BP: (!) 120/56 (!) 127/52  Pulse: 64 75  Resp: 17 13  Temp: 98.6 F (37 C) 98.3 F (36.8 C)  SpO2: 100% 100%    Physical Exam: Incisions:  bandage in tact with some bloody drainage.   Left leg with brisk left PT doppler signal   CBC    Component Value Date/Time   WBC 19.8 (H) 07/03/2021 0209   RBC 3.12 (L) 07/03/2021 0209   HGB 7.9 (L) 07/03/2021 0209   HGB 15.7 01/10/2020 1146   HCT 25.6 (L) 07/03/2021 0209   HCT 46.1 01/10/2020 1146   PLT 475 (H) 07/03/2021 0209   PLT 232 01/10/2020 1146   MCV 82.1 07/03/2021 0209   MCV 83 01/10/2020 1146   MCH 25.3 (L) 07/03/2021 0209   MCHC 30.9 07/03/2021 0209   RDW 22.5 (H) 07/03/2021 0209   RDW 12.8 01/10/2020 1146   LYMPHSABS 2.1 07/02/2021 0308   LYMPHSABS 2.5 03/01/2015 1414   MONOABS 1.0 07/02/2021 0308   EOSABS 0.2 07/02/2021 0308   EOSABS 0.4 03/01/2015 1414   BASOSABS 0.1 07/02/2021 0308   BASOSABS 0.1 03/01/2015 1414    BMET    Component Value Date/Time   NA 133 (L) 07/03/2021 0209   NA 142 01/10/2020 1146   K 4.4 07/03/2021 0209   CL 100 07/03/2021 0209   CO2 23 07/03/2021 0209   GLUCOSE 123 (H) 07/03/2021 0209   BUN 11 07/03/2021 0209   BUN 19 01/10/2020 1146   CREATININE 0.63 07/03/2021 0209   CREATININE 0.94 05/24/2021 1549   CALCIUM 9.1 07/03/2021 0209   GFRNONAA >60 07/03/2021 0209   GFRAA >60 09/12/2020 0357    INR    Component Value Date/Time   INR 1.1 06/27/2021 0159     Intake/Output Summary (Last 24 hours) at 07/03/2021 0809 Last data filed at 07/03/2021 0600 Gross per 24 hour  Intake 1958.13 ml  Output 2650 ml  Net -691.87 ml     Assessment/Plan:  70 y.o. male is s/p right above knee amputation  1 Day Post-Op  -will take down dressing  tomorrow.   -still with leukocytosis-wound cx/gram stain from OR pending-continue ancef for now -left PT doppler signal is brisk -discussed with pt that he will need rehab prior to going home for his safety.   Doreatha Massed, PA-C [facility] [phone] 07/03/2021 8:09 AM   Agree with above PT OT Follow up cultures   Fabienne Bruns, MD [facility] [place] Office: [phone]

## 2021-07-04 ENCOUNTER — Inpatient Hospital Stay (HOSPITAL_COMMUNITY): Payer: Medicare Other

## 2021-07-04 DIAGNOSIS — R4182 Altered mental status, unspecified: Secondary | ICD-10-CM

## 2021-07-04 DIAGNOSIS — I5030 Unspecified diastolic (congestive) heart failure: Secondary | ICD-10-CM

## 2021-07-04 LAB — ECHOCARDIOGRAM COMPLETE
AR max vel: 2.66 cm2
AV Area VTI: 2.78 cm2
AV Area mean vel: 2.63 cm2
AV Mean grad: 9 mmHg
AV Peak grad: 16.6 mmHg
Ao pk vel: 2.04 m/s
Area-P 1/2: 4.6 cm2
Height: 74 in
S' Lateral: 3.4 cm
Weight: 2543.23 oz

## 2021-07-04 LAB — COMPREHENSIVE METABOLIC PANEL
ALT: 887 U/L — ABNORMAL HIGH (ref 0–44)
AST: 1255 U/L — ABNORMAL HIGH (ref 15–41)
Albumin: 2.4 g/dL — ABNORMAL LOW (ref 3.5–5.0)
Alkaline Phosphatase: 486 U/L — ABNORMAL HIGH (ref 38–126)
Anion gap: 10 (ref 5–15)
BUN: 14 mg/dL (ref 8–23)
CO2: 23 mmol/L (ref 22–32)
Calcium: 8.5 mg/dL — ABNORMAL LOW (ref 8.9–10.3)
Chloride: 99 mmol/L (ref 98–111)
Creatinine, Ser: 0.89 mg/dL (ref 0.61–1.24)
GFR, Estimated: >60 mL/min (ref >60)
Glucose, Bld: 138 mg/dL — ABNORMAL HIGH (ref 70–99)
Potassium: 3.8 mmol/L (ref 3.5–5.1)
Sodium: 132 mmol/L — ABNORMAL LOW (ref 135–145)
Total Bilirubin: 1.6 mg/dL — ABNORMAL HIGH (ref 0.3–1.2)
Total Protein: 6.4 g/dL — ABNORMAL LOW (ref 6.5–8.1)

## 2021-07-04 LAB — PHOSPHORUS: Phosphorus: 2.9 mg/dL (ref 2.5–4.6)

## 2021-07-04 LAB — CBC WITH DIFFERENTIAL/PLATELET
Abs Immature Granulocytes: 0.37 10*3/uL — ABNORMAL HIGH (ref 0.00–0.07)
Basophils Absolute: 0.1 10*3/uL (ref 0.0–0.1)
Basophils Relative: 0 %
Eosinophils Absolute: 0 10*3/uL (ref 0.0–0.5)
Eosinophils Relative: 0 %
HCT: 25.1 % — ABNORMAL LOW (ref 39.0–52.0)
Hemoglobin: 7.9 g/dL — ABNORMAL LOW (ref 13.0–17.0)
Immature Granulocytes: 1 %
Lymphocytes Relative: 3 %
Lymphs Abs: 0.8 10*3/uL (ref 0.7–4.0)
MCH: 25.2 pg — ABNORMAL LOW (ref 26.0–34.0)
MCHC: 31.5 g/dL (ref 30.0–36.0)
MCV: 80.2 fL (ref 80.0–100.0)
Monocytes Absolute: 0.8 10*3/uL (ref 0.1–1.0)
Monocytes Relative: 3 %
Neutro Abs: 25.8 10*3/uL — ABNORMAL HIGH (ref 1.7–7.7)
Neutrophils Relative %: 93 %
Platelets: 455 10*3/uL — ABNORMAL HIGH (ref 150–400)
RBC: 3.13 MIL/uL — ABNORMAL LOW (ref 4.22–5.81)
RDW: 22.5 % — ABNORMAL HIGH (ref 11.5–15.5)
WBC: 27.8 10*3/uL — ABNORMAL HIGH (ref 4.0–10.5)
nRBC: 0 % (ref 0.0–0.2)

## 2021-07-04 LAB — MAGNESIUM: Magnesium: 1.9 mg/dL (ref 1.7–2.4)

## 2021-07-04 LAB — PROTIME-INR
INR: 1.3 — ABNORMAL HIGH (ref 0.8–1.2)
Prothrombin Time: 16 seconds — ABNORMAL HIGH (ref 11.4–15.2)

## 2021-07-04 LAB — GLUCOSE, CAPILLARY: Glucose-Capillary: 128 mg/dL — ABNORMAL HIGH (ref 70–99)

## 2021-07-04 IMAGING — DX DG CHEST 1V PORT
1 series · 1 of 1 positions shown · non-contrast
Comparison: Single-view of the chest [DATE] and [DATE]

CLINICAL DATA: Fever today.

EXAM:
PORTABLE CHEST 1 VIEW

[chest ap]
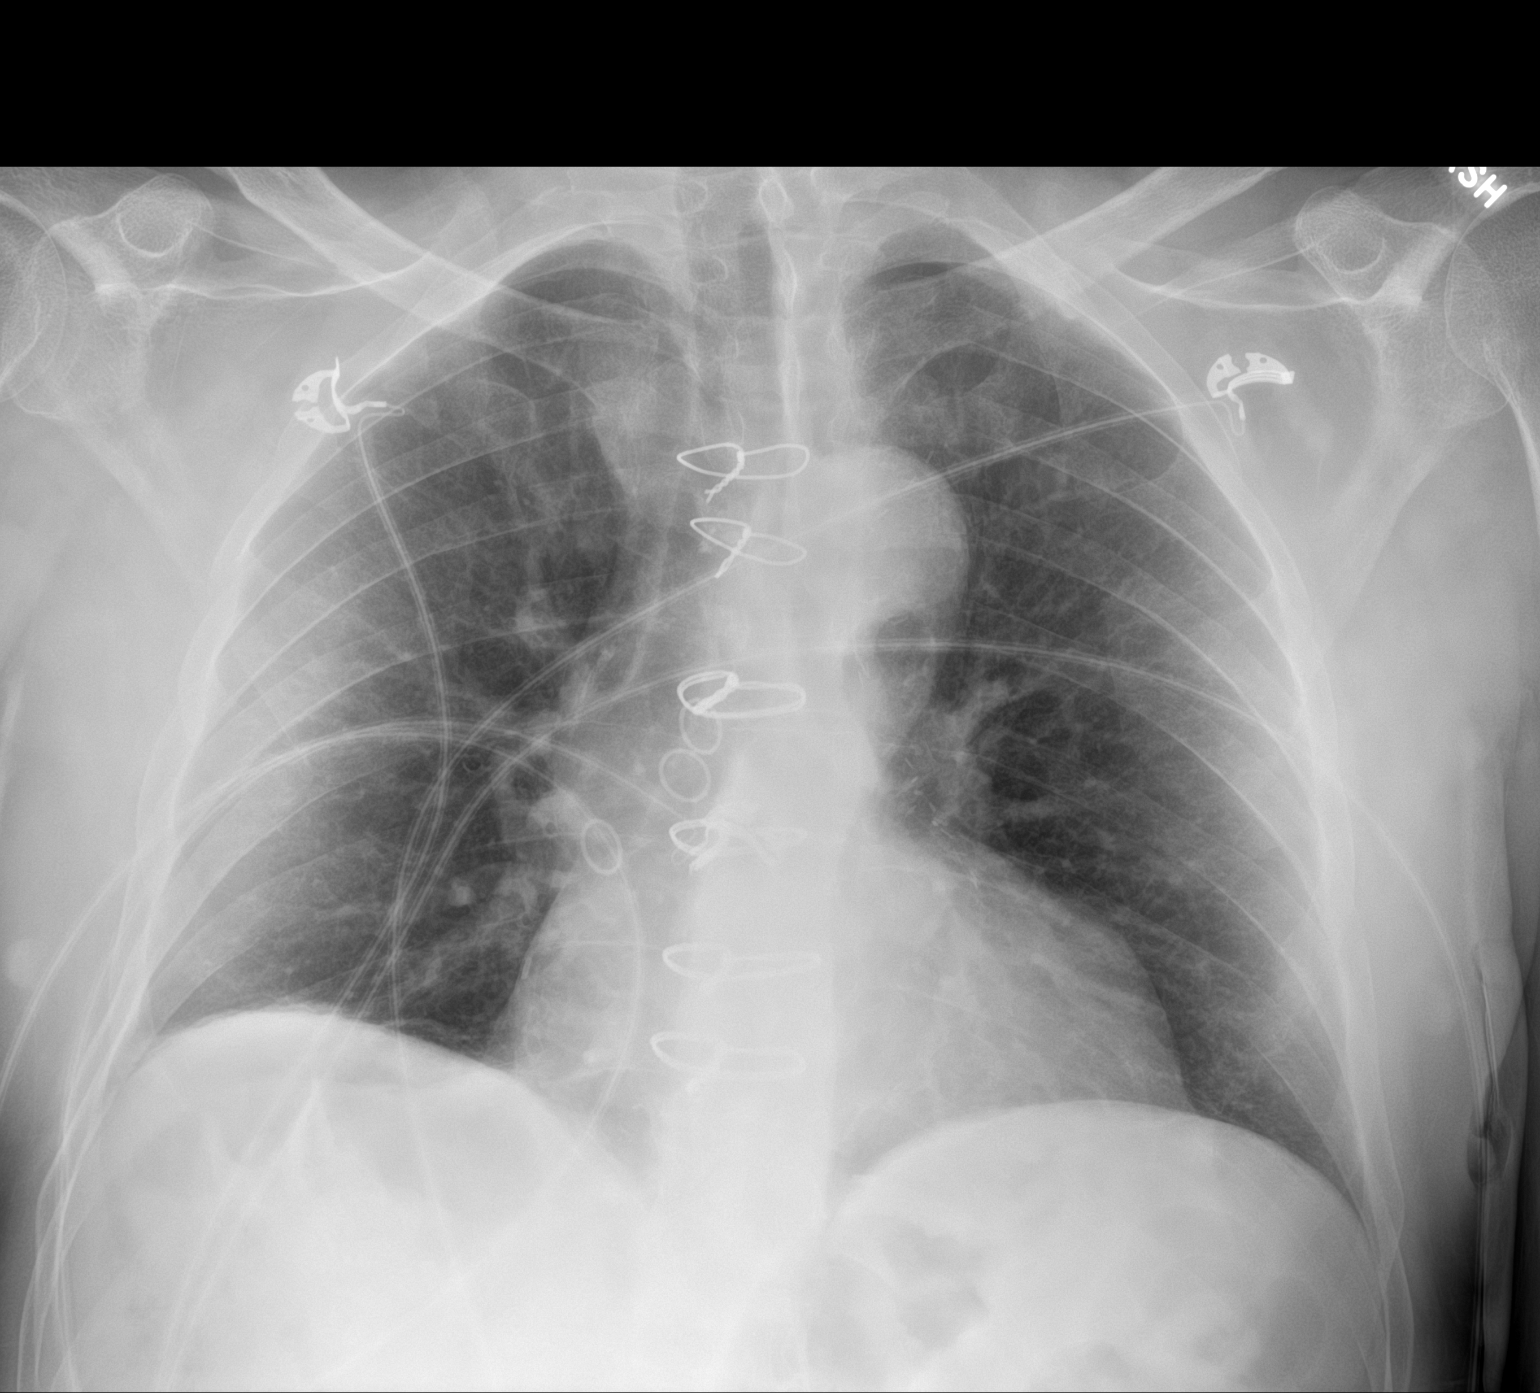

[1 of 1 positions shown; findings below may reference images not displayed]

FINDINGS: Heart size is upper normal. The patient is status post CABG. Lungs
clear. No pneumothorax or pleural fluid. No acute or focal bony
abnormality.
IMPRESSION: No acute disease.

## 2021-07-04 IMAGING — US US ABDOMEN LIMITED
1 series · 14 of 25 positions shown · non-contrast
Comparison: [DATE]

CLINICAL DATA: History of hepatitis

EXAM:
ULTRASOUND ABDOMEN LIMITED RIGHT UPPER QUADRANT

[Series 1: us abdomen limited ruq (liver/gb) · 14 of 44 slices shown]
[im 1/44]
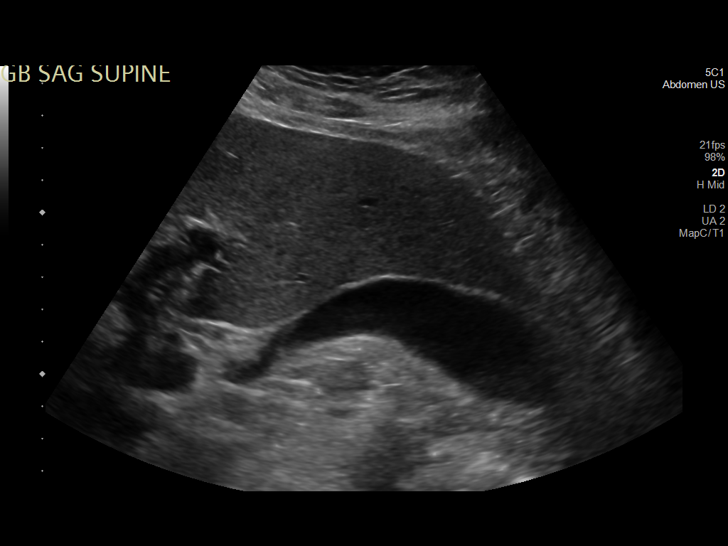
[im 4/44]
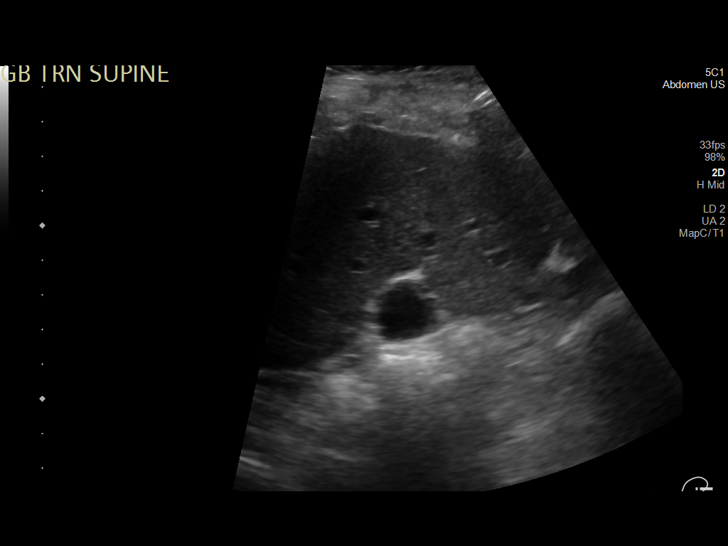
[im 8/44]
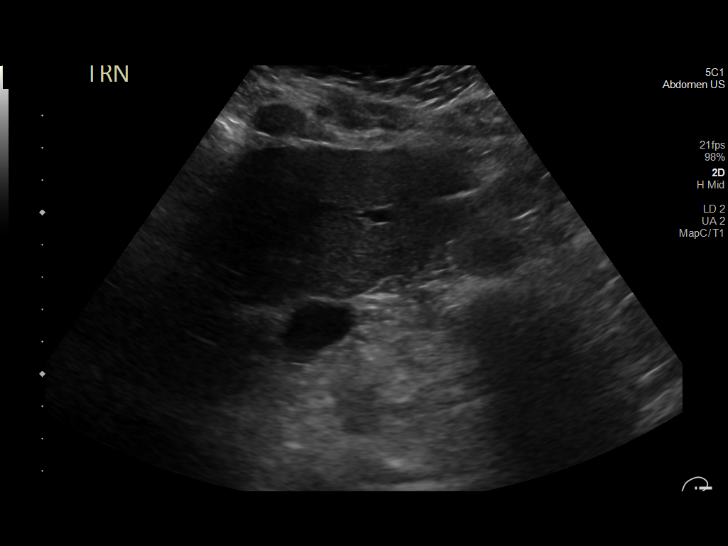
[im 11/44]
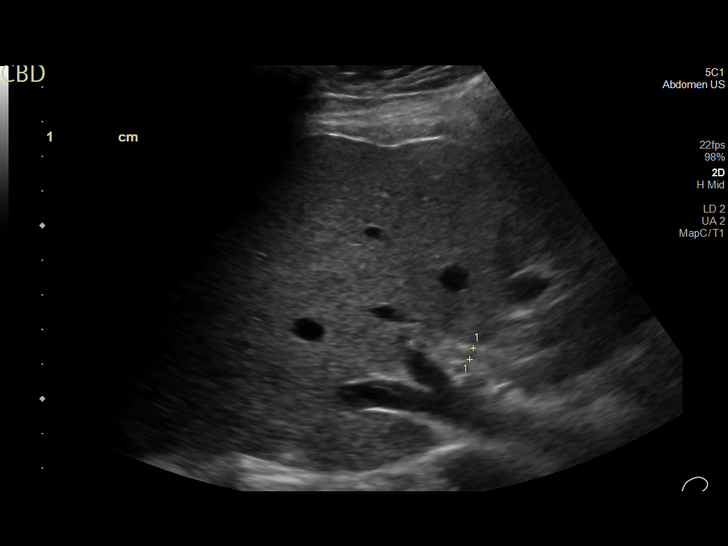
[im 15/44]
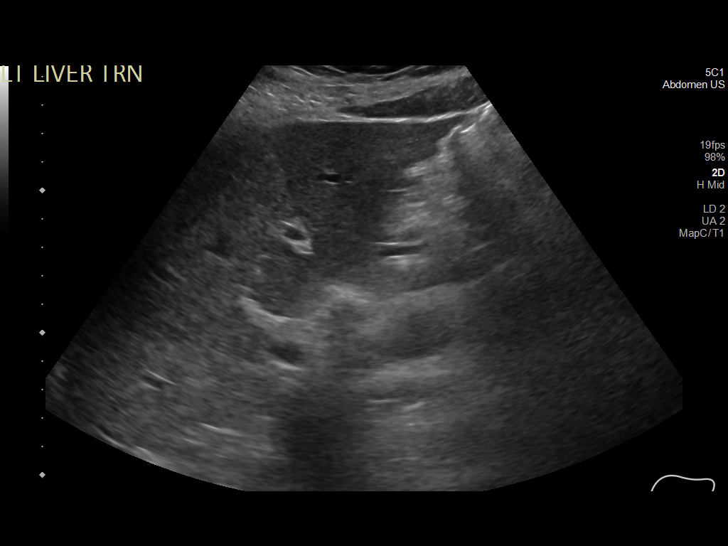
[im 17/44]
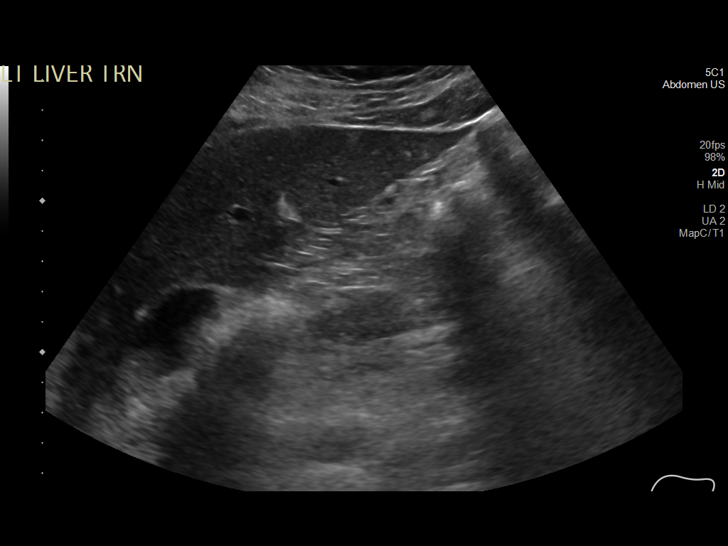
[im 20/44]
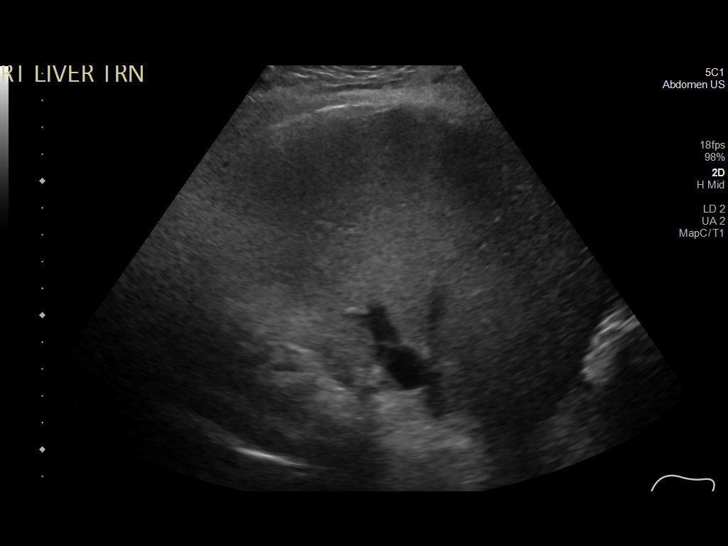
[im 24/44]
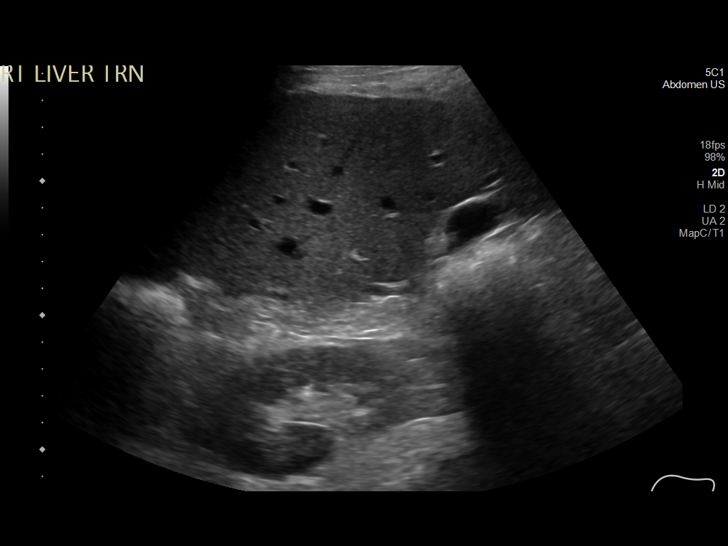
[im 27/44]
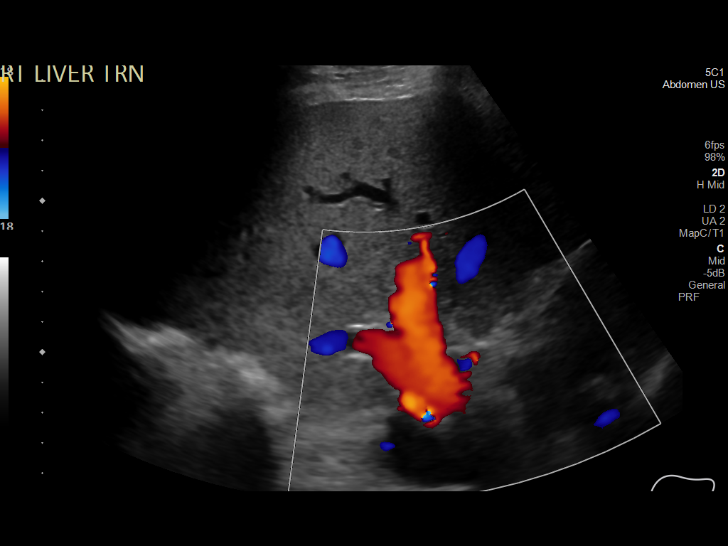
[im 29/44]
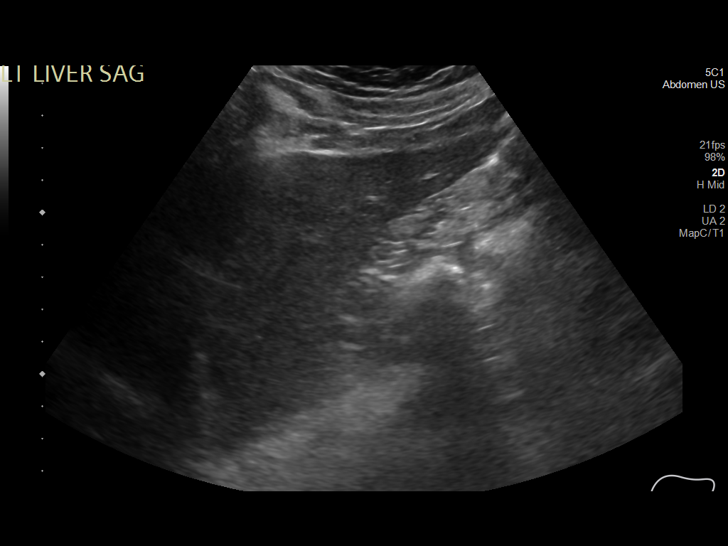
[im 33/44]
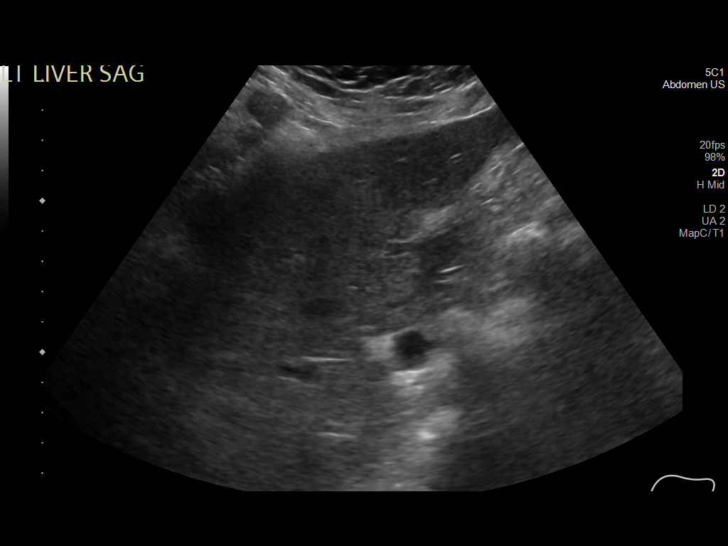
[im 36/44]
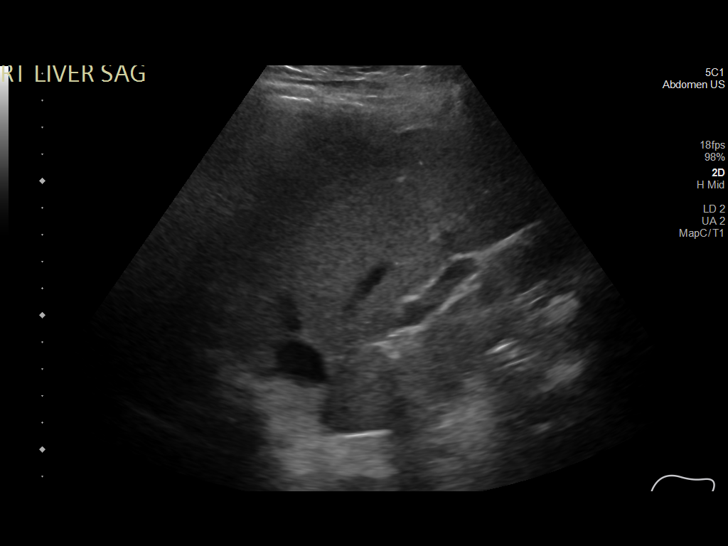
[im 40/44]
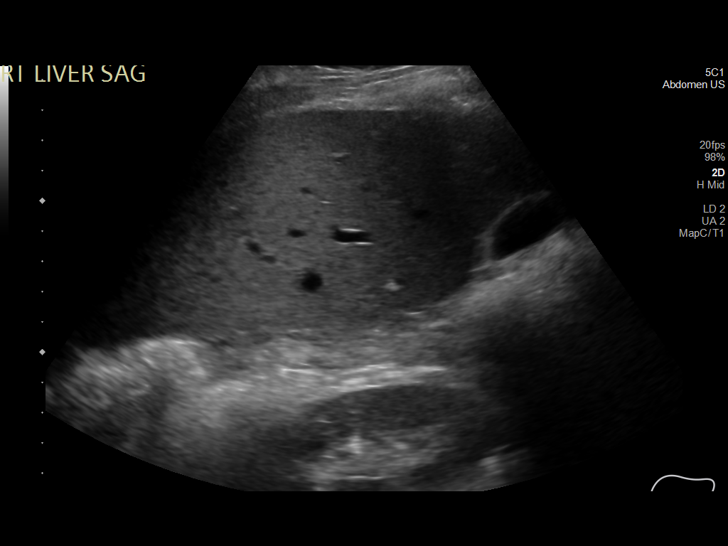
[im 44/44]
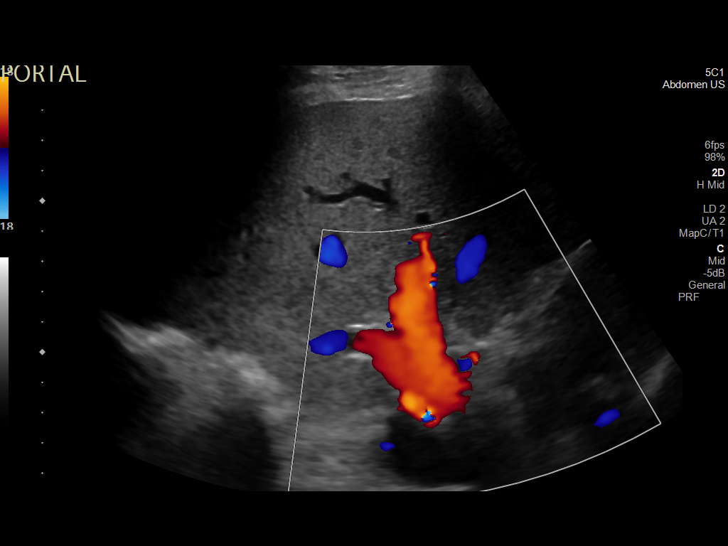

[14 of 25 positions shown; findings below may reference images not displayed]

FINDINGS: Gallbladder:

No gallstones or wall thickening visualized. No sonographic Murphy
sign noted by sonographer.

Common bile duct:

Diameter: 3.4 mm

Liver:

Mild increased echogenicity is noted similar to that seen on the
prior exam likely representing fatty infiltration. No focal mass is
noted. Portal vein is patent on color Doppler imaging with normal
direction of blood flow towards the liver.

Other: None.
IMPRESSION: Mild fatty infiltration of the liver.

No other focal abnormality is noted.

## 2021-07-04 MED ORDER — LACTATED RINGERS IV BOLUS: 500.0000 mL | Freq: Once | INTRAVENOUS | Status: DC

## 2021-07-04 MED ORDER — LACTATED RINGERS IV BOLUS
1000.0000 mL | Freq: Once | INTRAVENOUS | Status: AC
  Administered 2021-07-04: 1000 mL via INTRAVENOUS

## 2021-07-04 MED ORDER — LACTATED RINGERS IV SOLN: INTRAVENOUS | Status: DC

## 2021-07-04 MED ORDER — KETOROLAC TROMETHAMINE 15 MG/ML IJ SOLN
15.0000 mg | Freq: Four times a day (QID) | INTRAMUSCULAR | Status: AC | PRN
  Administered 2021-07-04 (×2): 15 mg via INTRAVENOUS
  Filled 2021-07-04 (×2): qty 1

## 2021-07-04 MED ORDER — SODIUM CHLORIDE 0.9% FLUSH
10.0000 mL | Freq: Two times a day (BID) | INTRAVENOUS | Status: DC
  Administered 2021-07-05 – 2021-07-12 (×11): 10 mL

## 2021-07-04 MED ORDER — SODIUM CHLORIDE 0.9% FLUSH: 10.0000 mL | INTRAVENOUS | Status: DC | PRN

## 2021-07-04 MED ORDER — VANCOMYCIN HCL IN DEXTROSE 1-5 GM/200ML-% IV SOLN
1000.0000 mg | Freq: Two times a day (BID) | INTRAVENOUS | Status: DC
  Administered 2021-07-04: 1000 mg via INTRAVENOUS
  Filled 2021-07-04 (×2): qty 200

## 2021-07-04 MED ORDER — VANCOMYCIN HCL 1500 MG/300ML IV SOLN
1500.0000 mg | Freq: Once | INTRAVENOUS | Status: AC
  Administered 2021-07-04: 1500 mg via INTRAVENOUS
  Filled 2021-07-04: qty 300

## 2021-07-04 MED ORDER — SODIUM CHLORIDE 0.9 % IV SOLN
2.0000 g | Freq: Three times a day (TID) | INTRAVENOUS | Status: DC
  Administered 2021-07-04 – 2021-07-12 (×24): 2 g via INTRAVENOUS
  Filled 2021-07-04 (×25): qty 2

## 2021-07-04 NOTE — Progress Notes (Addendum)
Culture from graft growing pseudomonas.  Will start IV antibiotics today.  Will probably need ID involved to determine length of therapy.  Would only consider removing remaining segment of graft if he develops pointing signs on infection in the right leg.  Pt currently on Cefepime per primary service.  Fabienne Bruns, MD [facility] [place] Office: [phone]

## 2021-07-04 NOTE — Progress Notes (Signed)
Orthopedic Tech Progress Note Patient Details:  Lonnie [name] 06-26-1951 [id number] Called order into [employer] Patient ID: [name], male   DOB: 1951/02/26, 70 y.o.   MRN: [id number]  [name] 07/04/2021, 9:35 AM

## 2021-07-04 NOTE — Progress Notes (Signed)
Pt BP 173/101, P 158-160; T 101.1; O2 100 RA. Patient states R foot pain (amputated foot). MD notified.   EKG done. Pain medication given. LR bolus given. Rapid notified.  Kenard Gower, RN

## 2021-07-04 NOTE — Progress Notes (Signed)
Pharmacy Antibiotic Note  Lonnie [name] is a 70 y.o. male admitted on 06/15/2021 with sepsis.  Pharmacy has been consulted for vancomycin and cefepime dosing. Patient is 2 days Post-Op from a AKA.   SCr used: 0.89, CrCl=81 mL/min, calculated using total body weight (TBW<IBW) Vancomycin 1000 mg IV Q 12 hrs. Goal AUC 400-550. Expected AUC: 537.8  Plan: Loading Dose: Vancomycin 1500 mg IV then continue with Vancomycin 1000 mg IV Q 12 hrs. Cefepime 2 g Q 8h.  F/up CBC, renal function, cultures, and vanc levels   Height: 6\' 2"  (188 cm) Weight: 72.1 kg (158 lb 15.2 oz) IBW/kg (Calculated) : 82.2  Temp (24hrs), Avg:99.8 F (37.7 C), Min:98.2 F (36.8 C), Max:103 F (39.4 C)  Recent Labs  Lab 06/30/21 0243 07/01/21 0123 07/02/21 0308 07/03/21 0209 07/04/21 0214  WBC 17.5* 18.7* 15.7* 19.8* 27.8*  CREATININE 0.74 0.72 0.80 0.63 0.89    Estimated Creatinine Clearance: 81 mL/min (by C-G formula based on SCr of 0.89 mg/dL).    Allergies  Allergen Reactions   Ace Inhibitors Swelling and Other (See Comments)    Angioedema   Eggs Or Egg-Derived Products Nausea Only and Other (See Comments)    Cannot eat prepared eggs- Occasional nausea   Lactose Intolerance (Gi) Diarrhea, Nausea Only and Other (See Comments)    Flatulence, also   Latex Itching     Thank you for allowing pharmacy to be a part of this patient's care.  07/06/21 07/04/2021 8:51 AM

## 2021-07-04 NOTE — Progress Notes (Addendum)
PROGRESS NOTE    Lonnie [name]  MHD:[id number] DOB: [birth year] DOA: 06/15/2021 PCP: [name], MD   No chief complaint on file.  Brief Narrative:  70 yo with hx CAD s/p CABG x4 2014, subdural hematoma s/p craniotomy (2020, with residual balance deficits and delayed processing), ischemic cardiomyopathy, right popliteal bypass with PTFE graft 01/23/2020, recent admission 06/07/21 with occluded R fem pop bypass s/p thrombolysis, mechanical thrombectomy of R femoropoliteal bypass graft, stent x2 of the right femoral bypass graft.  He was readmitted 6/25 with weakness and confusion and found to have bladder rentention and possible UTI (multiple species).  His covid testing was positive and he was treated with steroids and remdesivir.  He developed spontaneous bleeding on 7/6 from prior area of R femoropopliteal bypass and then large volume bleeding with hemorrhagic shock on 7/7.  He was taken to the OR by Dr. Chestine Spore on 7/7 for exploration of R below knee popliteal wound, repair of R popliteal vein, ligation of R popliteal artery, and placement of negative pressure wound vac.  He had right AKA on 7/12 with gangrene of R foot.  Assessment & Plan:   Active Problems:   Acute metabolic encephalopathy   AKI (acute kidney injury) (HCC)   Status post above-knee amputation of right lower extremity (HCC)  Addendum Called regarding tachycardia to 160's.  EKG showing sinus tach.  Also with fever, HTN 170's.  Suspect related to infection below.  Treated fever with toradol.  Bolus and follow.  Continue cefepime/vancomycin for sepsis (suspect infection related to pseudomonas from surgical culture).  Vascular recommending ID c/s, will place in AM. Follow vitals - tachycardia, HTN improving If worsening, consider CT  Sepsis  Source unclear.  Wound vs UTI with indwelling foley vs biliary issue (mild elevation), etc.   Vascular notes wound well appearing, without concerning signs of infection CXR without acute  disease RUQ [place] pending Blood cx pending  Surgical culture with moderate pseudomonas - follow final cx UA pending - will trial void with foley Leukocytosis impressive today Broaden abx to vanc/cefepime IVF cautiously, hx HFrEF (follow echo)  Elevated Liver Enzymes Significant bump in LFT's today - have been elevated throughout admission Hepatocellular pattern of liver injury ? Medications vs biliary (though seems less likely with bili only 1.6) vs hemodynamic (BP on lower side this AM) Follow RUQ [place] Minimize meds which could contribute to liver injury  Acute Metabolic Encephalopathy Suspect related to above Delirium precautions W/u further as indicated  Acute Blood Loss Anemia with Hypovolemic Shock Occluded Right Femoropopliteal Bypass s/p lysis complicated by bleeding with disruption and reocclusion of graft S/p right above knee amputation  S/p exploration of R below knee popliteal wound, repair of R popliteal vein, ligation of right popliteal artery and placement of negative pressure wound vac on 7/7 S/p right AKA 7/12 Follow wound cx/gram stain from OR, abx per vascular - ancef S/p 2 units pRBC (I see 3 units ordered, only 2 documented as being completed, 3rd as "transfusing" - unclear) Continue aspirin, plavix Gabapentin for phantom pain  Hb relatively stable today, follow  Leukocytosis See sepsis  Severe PAD  CAD s/p CABG x4 Aspirin, plavix Not currently on statin, unclear reason, will review Continue metoprolol F/u with cardiology and vascular outpatient   LUTS  BPH S/p foley on admission D/c foley as noted above - follow bladder scans Needs urology f/u outpatient Flomax, bethanecol  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 Celexa 20 mg, seroquel 100 mg  BID  Acute Metabolice Encephalopathy Thought related to covid vs urinary retention? Delirium precautions  Systolic  Diastolic HF Appears compensated, follow    Elevated LFT's  Hepatic Steatosis Unclear etiology - COVID? Steatosis? Hemodynamic shifts? Negative acute hepatitis panel  COVID 19 Virus infection S/p remdesivir/decadron  DVT prophylaxis: SCD Code Status: dnr Family Communication: none at bedside Disposition:   Status is: Inpatient  Remains inpatient appropriate because:Inpatient level of care appropriate due to severity of illness  Dispo:  Patient From:    Planned Disposition: Skilled Nursing Facility  Medically stable for discharge:           Consultants:  vascular  Procedures:  LE US Summary:  RIGHT:  - There is no evidence of deep vein thrombosis in the lower extremity.     - No cystic structure found in the popliteal fossa.     - Incidental: Extensive fluid surrounding stent at popliteal fossa. Flow  is identified within the stent at the level of the popliteal.   - Ultrasound characteristics of enlarged lymph nodes are noted in the  groin.     LEFT:  - There is no evidence of deep vein thrombosis in the lower extremity.     - No cystic structure found in the popliteal fossa.      Antimicrobials:  Anti-infectives (From admission, onward)    Start     Dose/Rate Route Frequency Ordered Stop   07/04/21 2200  vancomycin (VANCOCIN) IVPB 1000 mg/200 mL premix        1,000 mg 200 mL/hr over 60 Minutes Intravenous Every 12 hours 07/04/21 0906     07/04/21 1000  vancomycin (VANCOREADY) IVPB 1500 mg/300 mL        1,500 mg 150 mL/hr over 120 Minutes Intravenous  Once 07/04/21 0906 07/04/21 1348   07/04/21 1000  ceFEPIme (MAXIPIME) 2 g in sodium chloride 0.9 % 100 mL IVPB        2 g 200 mL/hr over 30 Minutes Intravenous Every 8 hours 07/04/21 0906     07/02/21 2200  ceFAZolin (ANCEF) IVPB 1 g/50 mL premix  Status:  Discontinued        1 g 100 mL/hr over 30 Minutes Intravenous Every 8 hours 07/02/21 1816 07/04/21 0822   06/17/21 1400  ceFEPIme (MAXIPIME) 2 g in sodium chloride 0.9 % 100 mL IVPB        2  g 200 mL/hr over 30 Minutes Intravenous Every 12 hours 06/17/21 1258 06/19/21 0955   06/17/21 1000  remdesivir 100 mg in sodium chloride 0.9 % 100 mL IVPB       See Hyperspace for full Linked Orders Report.   100 mg 200 mL/hr over 30 Minutes Intravenous Daily 06/16/21 0135 06/18/21 1026   06/16/21 2200  cefTRIAXone (ROCEPHIN) 2 g in sodium chloride 0.9 % 100 mL IVPB  Status:  Discontinued        2 g 200 mL/hr over 30 Minutes Intravenous Every 24 hours 06/15/21 2304 06/17/21 1218   06/16/21 2200  vancomycin (VANCOREADY) IVPB 1000 mg/200 mL  Status:  Discontinued        1,000 mg 200 mL/hr over 60 Minutes Intravenous Every 12 hours 06/16/21 0820 06/17/21 1218   06/16/21 0915  vancomycin (VANCOREADY) IVPB 1750 mg/350 mL        1,750 mg 175 mL/hr over 120 Minutes Intravenous  Once 06/16/21 0820 06/16/21 1114   06/16/21 0230  remdesivir 200 mg in sodium chloride 0.9% 250 mL IVPB  See Hyperspace for full Linked Orders Report.   200 mg 580 mL/hr over 30 Minutes Intravenous Once 06/16/21 0135 06/16/21 0324   06/15/21 2230  linezolid (ZYVOX) IVPB 600 mg  Status:  Discontinued        600 mg 300 mL/hr over 60 Minutes Intravenous Every 12 hours 06/15/21 2228 06/16/21 0811   06/15/21 2115  cefTRIAXone (ROCEPHIN) 2 g in sodium chloride 0.9 % 100 mL IVPB        2 g 200 mL/hr over 30 Minutes Intravenous  Once 06/15/21 2114 06/15/21 2328          Subjective: Confused, pleasantly   Objective: Vitals:   07/04/21 0937 07/04/21 1003 07/04/21 1136 07/04/21 1207  BP:  114/63  (!) 110/97  Pulse: (!) 108 (!) 106 95 94  Resp: 19 15 16 18   Temp:  (!) 102.9 F (39.4 C) 99.5 F (37.5 C) 99.8 F (37.7 C)  TempSrc:  Oral Oral Oral  SpO2: 99% 97% 99% 100%  Weight:      Height:        Intake/Output Summary (Last 24 hours) at 07/04/2021 1530 Last data filed at 07/04/2021 1208 Gross per 24 hour  Intake 607.69 ml  Output 1200 ml  Net -592.31 ml   Filed Weights   07/02/21 0415 07/03/21 0300  07/04/21 0345  Weight: 78 kg 73.9 kg 72.1 kg    Examination:  General: No acute distress. Cardiovascular: RRR Lungs: unlabored Abdomen: Soft, nontender, nondistended  Neurological: pleasantly confused, mittens on, moving all extremities Skin: Warm and dry. No rashes or lesions. Extremities: R AKA with intact dressing   Data Reviewed: I have personally reviewed following labs and imaging studies  CBC: Recent Labs  Lab 06/28/21 0632 06/30/21 0243 07/01/21 0123 07/02/21 0308 07/03/21 0209 07/04/21 0214  WBC 20.0* 17.5* 18.7* 15.7* 19.8* 27.8*  NEUTROABS 15.4* 12.3* 14.7* 12.1*  --  25.8*  HGB 9.4* 8.4* 8.6* 8.6* 7.9* 7.9*  HCT 27.9* 25.9* 27.5* 27.6* 25.6* 25.1*  MCV 78.2* 79.7* 81.6 81.4 82.1 80.2  PLT 565* 527* 556* 505* 475* 455*    Basic Metabolic Panel: Recent Labs  Lab 06/30/21 0243 07/01/21 0123 07/02/21 0308 07/03/21 0209 07/04/21 0214  NA 134* 135 134* 133* 132*  K 4.0 4.3 4.5 4.4 3.8  CL 102 103 101 100 99  CO2 25 24 27 23 23   GLUCOSE 121* 105* 139* 123* 138*  BUN 15 11 11 11 14   CREATININE 0.74 0.72 0.80 0.63 0.89  CALCIUM 8.8* 9.1 9.3 9.1 8.5*  MG  --   --   --   --  1.9  PHOS  --   --   --   --  2.9    GFR: Estimated Creatinine Clearance: 81 mL/min (by C-G formula based on SCr of 0.89 mg/dL).  Liver Function Tests: Recent Labs  Lab 06/29/21 0508 06/30/21 0243 07/01/21 0123 07/02/21 0308 07/04/21 0214  AST 121* 98* 138* 93* 1,255*  ALT 138* 142* 182* 162* 887*  ALKPHOS 136* 122 120 111 486*  BILITOT 0.6 0.9 0.7 0.5 1.6*  PROT 6.1* 6.3* 6.4* 6.8 6.4*  ALBUMIN 2.3* 2.4* 2.3* 2.4* 2.4*    CBG: Recent Labs  Lab 06/27/21 1917  GLUCAP 139*     Recent Results (from the past 240 hour(s))  Aerobic Culture w Gram Stain (superficial specimen)     Status: None (Preliminary result)   Collection Time: 07/02/21  4:13 PM   Specimen: PATH Other; Tissue  Result Value Ref  Range Status   Specimen Description WOUND RIGHT LEG  Final   Special  Requests BYPASS  GRAFT PT ON ANCEF  Final   Gram Stain   Final    MODERATE WBC PRESENT,BOTH PMN AND MONONUCLEAR NO ORGANISMS SEEN    Culture   Final    MODERATE PSEUDOMONAS AERUGINOSA SUSCEPTIBILITIES TO FOLLOW Performed at [facility], [address] [place], [place] [address]    Report Status PENDING  Incomplete         Radiology Studies: DG CHEST PORT 1 VIEW  Result Date: 07/04/2021 CLINICAL DATA:  Fever today. EXAM: PORTABLE CHEST 1 VIEW COMPARISON:  Single-view of the chest 06/26/2021 and 09/10/2020 FINDINGS: Heart size is upper normal. The patient is status post CABG. Lungs clear. No pneumothorax or pleural fluid. No acute or focal bony abnormality. IMPRESSION: No acute disease. Electronically Signed   By: Drusilla Kanner M.D.   On: 07/04/2021 10:45        Scheduled Meds:  sodium chloride   Intravenous Once   aspirin EC  81 mg Oral Daily   bethanechol  10 mg Oral TID   Chlorhexidine Gluconate Cloth  6 each Topical Daily   citalopram  20 mg Oral Daily   clopidogrel  75 mg Oral Daily   diphenhydrAMINE  25 mg Oral Once   ferrous fumarate-b12-vitamic C-folic acid  1 capsule Oral Daily   gabapentin  100 mg Oral Daily   gabapentin  300 mg Oral QHS   melatonin  10 mg Oral QHS   metoprolol tartrate  12.5 mg Oral BID   multivitamin with minerals  1 tablet Oral Daily   nicotine  7 mg Transdermal Daily   pantoprazole  40 mg Oral QPM   QUEtiapine  100 mg Oral BID   senna-docusate  1 tablet Oral BID   tamsulosin  0.8 mg Oral QPM   Continuous Infusions:  ceFEPime (MAXIPIME) IV 2 g (07/04/21 1435)   dextrose 5% lactated ringers 50 mL/hr at 07/03/21 0600   vancomycin       LOS: 18 days    Time spent: over 30 min    Lacretia Nicks, MD [facility]   To contact the attending provider between 7A-7P or the covering provider during after hours 7P-7A, please log into the web site [email or web address] and access using universal [place] password for that web  site. If you do not have the password, please call the hospital operator.  07/04/2021, 3:30 PM

## 2021-07-04 NOTE — Progress Notes (Addendum)
Progress Note    07/04/2021 8:18 AM 2 Days Post-Op  Subjective:  laying in bed moaning - not talking this morning.  Tm 103  Vitals:   07/04/21 0345 07/04/21 0811  BP: (!) 95/51 91/61  Pulse: 100 (!) 106  Resp: 16 17  Temp: 99.5 F (37.5 C) (!) 103 F (39.4 C)  SpO2: 98% 100%    Physical Exam: Incisions:  bandage removed and incision is clean and dry without evidence of infection.   CBC    Component Value Date/Time   WBC 27.8 (H) 07/04/2021 0214   RBC 3.13 (L) 07/04/2021 0214   HGB 7.9 (L) 07/04/2021 0214   HGB 15.7 01/10/2020 1146   HCT 25.1 (L) 07/04/2021 0214   HCT 46.1 01/10/2020 1146   PLT 455 (H) 07/04/2021 0214   PLT 232 01/10/2020 1146   MCV 80.2 07/04/2021 0214   MCV 83 01/10/2020 1146   MCH 25.2 (L) 07/04/2021 0214   MCHC 31.5 07/04/2021 0214   RDW 22.5 (H) 07/04/2021 0214   RDW 12.8 01/10/2020 1146   LYMPHSABS 0.8 07/04/2021 0214   LYMPHSABS 2.5 03/01/2015 1414   MONOABS 0.8 07/04/2021 0214   EOSABS 0.0 07/04/2021 0214   EOSABS 0.4 03/01/2015 1414   BASOSABS 0.1 07/04/2021 0214   BASOSABS 0.1 03/01/2015 1414    BMET    Component Value Date/Time   NA 132 (L) 07/04/2021 0214   NA 142 01/10/2020 1146   K 3.8 07/04/2021 0214   CL 99 07/04/2021 0214   CO2 23 07/04/2021 0214   GLUCOSE 138 (H) 07/04/2021 0214   BUN 14 07/04/2021 0214   BUN 19 01/10/2020 1146   CREATININE 0.89 07/04/2021 0214   CREATININE 0.94 05/24/2021 1549   CALCIUM 8.5 (L) 07/04/2021 0214   GFRNONAA >60 07/04/2021 0214   GFRAA >60 09/12/2020 0357    INR    Component Value Date/Time   INR 1.1 06/27/2021 0159     Intake/Output Summary (Last 24 hours) at 07/04/2021 0818 Last data filed at 07/04/2021 0523 Gross per 24 hour  Intake 752.69 ml  Output 1800 ml  Net -1047.31 ml     Assessment/Plan:  70 y.o. male is s/p right above knee amputation  2 Days Post-Op  -pt with Tm of 103 this morning and significant increase in leukocytosis.  Right AKA incision looks good  without any evidence of infection.  Gram stain revealed WBC but no organisms and cx is pending.   -primary team ordering cultures. -will order stump sock for AKA   Doreatha Massed, PA-C [facility] [phone] 07/04/2021 8:18 AM  Patient confused this morning.  T-max 103.  I personally changed his right above-knee amputation dressing.  There was some dried blood on the old dressing.  His right AKA stump is healing well.  There is no erythema or drainage.  There is no obvious hematoma.  His right groin has no evidence of fluctuance or erythema.  His culture remains negative to date from his graft.  He does have a Foley catheter in place.  He also has elevated liver enzymes.  Assessment: Healing right above-knee amputation.  Currently with leukocytosis and fever.  Currently this does not appear to be to be related to his graft in his right leg.  He has elevated liver function tests so certainly a biliary tract problem is a possibility.  He also has an indwelling Foley catheter so certainly a urinary tract infection may be a possibility as well.  Plan: Daily  dressing change right above-knee amputation.  We will continue to follow-up on his cultures from his graft.

## 2021-07-05 DIAGNOSIS — R7881 Bacteremia: Secondary | ICD-10-CM

## 2021-07-05 DIAGNOSIS — B965 Pseudomonas (aeruginosa) (mallei) (pseudomallei) as the cause of diseases classified elsewhere: Secondary | ICD-10-CM | POA: Diagnosis not present

## 2021-07-05 DIAGNOSIS — Z89611 Acquired absence of right leg above knee: Secondary | ICD-10-CM | POA: Diagnosis not present

## 2021-07-05 LAB — CBC WITH DIFFERENTIAL/PLATELET
Abs Immature Granulocytes: 0.37 10*3/uL — ABNORMAL HIGH (ref 0.00–0.07)
Basophils Absolute: 0.1 10*3/uL (ref 0.0–0.1)
Basophils Relative: 0 %
Eosinophils Absolute: 0.1 10*3/uL (ref 0.0–0.5)
Eosinophils Relative: 0 %
HCT: 21.4 % — ABNORMAL LOW (ref 39.0–52.0)
Hemoglobin: 6.7 g/dL — CL (ref 13.0–17.0)
Immature Granulocytes: 1 %
Lymphocytes Relative: 7 %
Lymphs Abs: 1.9 10*3/uL (ref 0.7–4.0)
MCH: 25.4 pg — ABNORMAL LOW (ref 26.0–34.0)
MCHC: 31.3 g/dL (ref 30.0–36.0)
MCV: 81.1 fL (ref 80.0–100.0)
Monocytes Absolute: 1.3 10*3/uL — ABNORMAL HIGH (ref 0.1–1.0)
Monocytes Relative: 5 %
Neutro Abs: 23.1 10*3/uL — ABNORMAL HIGH (ref 1.7–7.7)
Neutrophils Relative %: 87 %
Platelets: 354 10*3/uL (ref 150–400)
RBC: 2.64 MIL/uL — ABNORMAL LOW (ref 4.22–5.81)
RDW: 22.4 % — ABNORMAL HIGH (ref 11.5–15.5)
WBC: 26.7 10*3/uL — ABNORMAL HIGH (ref 4.0–10.5)
nRBC: 0 % (ref 0.0–0.2)

## 2021-07-05 LAB — BLOOD CULTURE ID PANEL (REFLEXED) - BCID2

## 2021-07-05 LAB — COMPREHENSIVE METABOLIC PANEL
ALT: 771 U/L — ABNORMAL HIGH (ref 0–44)
AST: 629 U/L — ABNORMAL HIGH (ref 15–41)
Albumin: 2 g/dL — ABNORMAL LOW (ref 3.5–5.0)
Alkaline Phosphatase: 323 U/L — ABNORMAL HIGH (ref 38–126)
Anion gap: 6 (ref 5–15)
BUN: 13 mg/dL (ref 8–23)
CO2: 25 mmol/L (ref 22–32)
Calcium: 8.6 mg/dL — ABNORMAL LOW (ref 8.9–10.3)
Chloride: 104 mmol/L (ref 98–111)
Creatinine, Ser: 0.9 mg/dL (ref 0.61–1.24)
GFR, Estimated: >60 mL/min (ref >60)
Glucose, Bld: 133 mg/dL — ABNORMAL HIGH (ref 70–99)
Potassium: 3.7 mmol/L (ref 3.5–5.1)
Sodium: 135 mmol/L (ref 135–145)
Total Bilirubin: 1.7 mg/dL — ABNORMAL HIGH (ref 0.3–1.2)
Total Protein: 5.8 g/dL — ABNORMAL LOW (ref 6.5–8.1)

## 2021-07-05 LAB — URINALYSIS, COMPLETE (UACMP) WITH MICROSCOPIC
Bacteria, UA: NONE SEEN
Bilirubin Urine: NEGATIVE
Glucose, UA: NEGATIVE mg/dL
Hgb urine dipstick: NEGATIVE
Ketones, ur: NEGATIVE mg/dL
Leukocytes,Ua: NEGATIVE
Nitrite: NEGATIVE
Protein, ur: NEGATIVE mg/dL
Specific Gravity, Urine: 1.017 (ref 1.005–1.030)
pH: 6 (ref 5.0–8.0)

## 2021-07-05 LAB — AEROBIC CULTURE W GRAM STAIN (SUPERFICIAL SPECIMEN)

## 2021-07-05 LAB — HEMOGLOBIN AND HEMATOCRIT, BLOOD
HCT: 29.5 % — ABNORMAL LOW (ref 39.0–52.0)
Hemoglobin: 9.6 g/dL — ABNORMAL LOW (ref 13.0–17.0)

## 2021-07-05 LAB — SURGICAL PATHOLOGY

## 2021-07-05 LAB — PREPARE RBC (CROSSMATCH)

## 2021-07-05 LAB — MAGNESIUM: Magnesium: 2.1 mg/dL (ref 1.7–2.4)

## 2021-07-05 LAB — PHOSPHORUS: Phosphorus: 3.7 mg/dL (ref 2.5–4.6)

## 2021-07-05 MED ORDER — ACETAMINOPHEN 325 MG PO TABS
650.0000 mg | ORAL_TABLET | Freq: Once | ORAL | Status: AC
  Administered 2021-07-05: 650 mg via ORAL
  Filled 2021-07-05: qty 2

## 2021-07-05 MED ORDER — FUROSEMIDE 10 MG/ML IJ SOLN
20.0000 mg | Freq: Once | INTRAMUSCULAR | Status: AC
  Administered 2021-07-05: 20 mg via INTRAVENOUS
  Filled 2021-07-05: qty 2

## 2021-07-05 MED ORDER — DIPHENHYDRAMINE HCL 25 MG PO CAPS
25.0000 mg | ORAL_CAPSULE | Freq: Once | ORAL | Status: AC
  Administered 2021-07-05: 25 mg via ORAL
  Filled 2021-07-05: qty 1

## 2021-07-05 MED ORDER — SODIUM CHLORIDE 0.9% IV SOLUTION: Freq: Once | INTRAVENOUS | Status: DC

## 2021-07-05 NOTE — Consult Note (Signed)
[facility]    Date of Admission:  06/15/2021     Total days of antibiotics 2   Cefepime       Reason for Consult: Pseudomonas BSI with graft infection     Referring Provider: Jerry CarasPowell, Fields  Primary Care Provider: Wanda PlumpPaz, Jose E, MD    Assessment: Lonnie PenLarry F Yakel is a 70 y.o. male with complicated history involving his right leg with recent attempts to correct thrombosed bypass graft to R leg that failed and required above knee amputation. Upon inspection of graft during procedure there was evidence of graft infection with intraoperative cultures from 7/12 growing out pseudomonas.  Unfortunately he developed worsening leukocytosis and fevers shortly after surgery and his blood cultures are also growing pseudomonas from 7/14 draw.   If he is a candidate for graft removal I think this would be ideal for him moving forward for cure of this infection. Pseudomonas can be very difficult to treat with affinity to form biofilms around foreign material, and would be worried he would be at high risk for recurrence of bacteremia. Continue IV cefepime for now. If were to treat medically would treat 6 weeks IV for vascular graft infection with close follow up.   Hold on picc line at this time until we can clear blood stream infection and fevers resolve x 48 hours. Given concern for vascular source will repeat blood cultures in AM to prove clearance.     Plan: Continue cefepime Repeat blood cultures in AM  Follow fever curve  Hold on PICC for now Would recommend consideration of removal of graft if possible   Active Problems:   Acute metabolic encephalopathy   AKI (acute kidney injury) (HCC)   Status post above-knee amputation of right lower extremity (HCC)   Altered mental status   Bacteremia due to Pseudomonas    sodium chloride   Intravenous Once   sodium chloride   Intravenous Once   aspirin EC  81 mg Oral Daily   bethanechol  10 mg Oral TID    Chlorhexidine Gluconate Cloth  6 each Topical Daily   citalopram  20 mg Oral Daily   clopidogrel  75 mg Oral Daily   diphenhydrAMINE  25 mg Oral Once   ferrous fumarate-b12-vitamic C-folic acid  1 capsule Oral Daily   gabapentin  100 mg Oral Daily   gabapentin  300 mg Oral QHS   melatonin  10 mg Oral QHS   metoprolol tartrate  12.5 mg Oral BID   multivitamin with minerals  1 tablet Oral Daily   nicotine  7 mg Transdermal Daily   pantoprazole  40 mg Oral QPM   QUEtiapine  100 mg Oral BID   senna-docusate  1 tablet Oral BID   sodium chloride flush  10-40 mL Intracatheter Q12H   tamsulosin  0.8 mg Oral QPM    HPI: Lonnie [name] is a 70 y.o. male admitted from home with altered mental status, weakness, urinary retention.   PMHx including dementia, PAD, tobacco use, depression/anxiety, IDA, peripheral neuropathy, HTN, GERD, HLD, BPH. R fem-pop bypass 12-2019.   He does not recall much of his medical history. Does report no pain to R leg currently since surgery. States he is doing well and no concerns with antibiotics he has received. Much of the history obtained from the patient's chart.   In ER he was found to meet SIRS criteria with leukocytosis, AI (1.49 with baseline 0.91). Normotensive  otherwise. R foot wound present on arrival that was drainig. Vascular surgery team consulted   Recent surgical history of occluded femoral below knee popliteal bypass graft - 6/17 s/p thrombolytics.  6/18 - mechanical thrombectomy with stenting of graft.  7/07 - exploration of R below-knee popliteal wound with repair of popliteal vein, wound vac placement  7/12 - R above knee amputation >> noted to have had murky fluid around the bypass graft with cultures growing sensitive pseucomonas aeruginosa   Blood cultures drawn on 7/14 also growing pseudomonas    Review of Systems: Review of Systems  Constitutional:  Negative for chills and fever.  HENT:  Negative for tinnitus.   Eyes:  Negative for  blurred vision and photophobia.  Respiratory:  Negative for cough and sputum production.   Cardiovascular:  Negative for chest pain.  Gastrointestinal:  Negative for diarrhea, nausea and vomiting.  Genitourinary:  Negative for dysuria.  Skin:  Negative for rash.  Neurological:  Negative for headaches.   Past Medical History:  Diagnosis Date   Allergy    Anxiety    Arthritis    left neck, shoulder, knee   CAD (coronary artery disease)    a. anterior STEMI 10/2013 s/p 4V CABG with LIMA to mid LAD, SVG to OM, SVG to PDA, SVG to Diagonal.   Chronic combined systolic and diastolic CHF (congestive heart failure) (HCC)    Dementia (HCC)    Depression    Falls    Hypertension    Ischemic cardiomyopathy    a. EF 40-45% at time of CABG and in 2018.   MI (myocardial infarction) (HCC)    Peripheral neuropathy    Prediabetes 09/01/2014   PVD (peripheral vascular disease) (HCC)    a. s/p L SFA stents with now known bilateral SFA. b. right femoral to below the knee bypass by Dr. Myra Gianotti in 12/2019, c/b infection 02/2020.   Stroke [facility])    seen on CT Scan   Subdural hematoma (HCC)    Tobacco abuse    UTI (urinary tract infection)     Social History   Tobacco Use   Smoking status: Every Day    Packs/day: 0.25    Years: 30.00    Pack years: 7.50    Types: Cigarettes   Smokeless tobacco: Never   Tobacco comments:    < 1/2 ppd  Vaping Use   Vaping Use: Never used  Substance Use Topics   Alcohol use: No    Alcohol/week: 0.0 standard drinks   Drug use: No    Family History  Problem Relation Age of Onset   CAD Father 51   AAA (abdominal aortic aneurysm) Father    Alcohol abuse Father    Hypertension Father    Diabetes Father    Heart attack Father    Stroke Mother    Arthritis Mother    Heart disease Mother    Hypertension Mother    Heart attack Mother    Cardiomyopathy Daughter    Colon cancer Neg Hx    Prostate cancer Neg Hx    Allergies  Allergen Reactions   Ace  Inhibitors Swelling and Other (See Comments)    Angioedema   Eggs Or Egg-Derived Products Nausea Only and Other (See Comments)    Cannot eat prepared eggs- Occasional nausea   Lactose Intolerance (Gi) Diarrhea, Nausea Only and Other (See Comments)    Flatulence, also   Latex Itching    OBJECTIVE: Blood pressure (!) 107/57, pulse 63, temperature 98.2  F (36.8 C), temperature source Oral, resp. rate 15, height  (1.88 m), weight 72.1 kg, SpO2 100 %.   Physical Exam Vitals reviewed.  Constitutional:      Appearance: He is not ill-appearing.  HENT:     Mouth/Throat:     Mouth: Mucous membranes are moist.     Pharynx: Oropharynx is clear.  Cardiovascular:     Rate and Rhythm: Normal rate and regular rhythm.  Pulmonary:     Effort: Pulmonary effort is normal.     Breath sounds: Normal breath sounds.  Musculoskeletal:     Comments: AKA is bandaged, clean and dry. Tenderness to palpation around lateral edge of incision.   Skin:    General: Skin is warm and dry.     Capillary Refill: Capillary refill takes less than 2 seconds.  Neurological:     General: No focal deficit present.     Mental Status: He is alert.     Comments: Seems mostly oriented to situation and environment. Pleasant.   Psychiatric:        Behavior: Behavior normal.        Thought Content: Thought content normal.        Judgment: Judgment normal.        Lab Results Lab Results  Component Value Date   WBC 26.7 (H) 07/05/2021   HGB 6.7 (LL) 07/05/2021   HCT 21.4 (L) 07/05/2021   MCV 81.1 07/05/2021   PLT 354 07/05/2021    Lab Results  Component Value Date   CREATININE 0.90 07/05/2021   BUN 13 07/05/2021   NA 135 07/05/2021   K 3.7 07/05/2021   CL 104 07/05/2021   CO2 25 07/05/2021    Lab Results  Component Value Date   ALT 771 (H) 07/05/2021   AST 629 (H) 07/05/2021   ALKPHOS 323 (H) 07/05/2021   BILITOT 1.7 (H) 07/05/2021     Microbiology: Recent Results (from the past 240 hour(s))   Aerobic Culture w Gram Stain (superficial specimen)     Status: None   Collection Time: 07/02/21  4:13 PM   Specimen: PATH Other; Tissue  Result Value Ref Range Status   Specimen Description WOUND RIGHT LEG  Final   Special Requests BYPASS  GRAFT PT ON ANCEF  Final   Gram Stain   Final    MODERATE WBC PRESENT,BOTH PMN AND MONONUCLEAR NO ORGANISMS SEEN Performed at [facility], [address] [place], [place] [address]    Culture MODERATE PSEUDOMONAS AERUGINOSA  Final   Report Status 07/05/2021 FINAL  Final   Organism ID, Bacteria PSEUDOMONAS AERUGINOSA  Final      Susceptibility   Pseudomonas aeruginosa - MIC*    CEFTAZIDIME 2 SENSITIVE Sensitive     CIPROFLOXACIN <=0.25 SENSITIVE Sensitive     GENTAMICIN <=1 SENSITIVE Sensitive     IMIPENEM 1 SENSITIVE Sensitive     PIP/TAZO <=4 SENSITIVE Sensitive     CEFEPIME 2 SENSITIVE Sensitive     * MODERATE PSEUDOMONAS AERUGINOSA  Culture, blood (routine x 2)     Status: Abnormal (Preliminary result)   Collection Time: 07/04/21  8:49 AM   Specimen: BLOOD LEFT ARM  Result Value Ref Range Status   Specimen Description BLOOD LEFT ARM  Final   Special Requests   Final    BOTTLES DRAWN AEROBIC ONLY Blood Culture adequate volume   Culture  Setup Time   Final    GRAM NEGATIVE RODS AEROBIC BOTTLE ONLY CRITICAL VALUE NOTED.  VALUE IS CONSISTENT WITH PREVIOUSLY REPORTED AND CALLED VALUE.    Culture (A)  Final    PSEUDOMONAS AERUGINOSA CULTURE REINCUBATED FOR BETTER GROWTH Performed at [facility], [address], [place], [place] [address]    Report Status PENDING  Incomplete  Culture, blood (routine x 2)     Status: None (Preliminary result)   Collection Time: 07/04/21  8:53 AM   Specimen: BLOOD RIGHT HAND  Result Value Ref Range Status   Specimen Description BLOOD RIGHT HAND  Final   Special Requests   Final    BOTTLES DRAWN AEROBIC ONLY Blood Culture results may not be optimal due to an inadequate volume of blood  received in culture bottles   Culture  Setup Time   Final    GRAM NEGATIVE RODS AEROBIC BOTTLE ONLY CRITICAL RESULT CALLED TO, READ BACK BY AND VERIFIED WITH: Dorthy Cooler [id number] 07/05/2021 Girtha Hake Performed at [facility], [address], [place], [place] [address]    Culture GRAM NEGATIVE RODS  Final   Report Status PENDING  Incomplete  Blood Culture ID Panel (Reflexed)     Status: Abnormal   Collection Time: 07/04/21  8:53 AM  Result Value Ref Range Status   Enterococcus faecalis NOT DETECTED NOT DETECTED Final   Enterococcus Faecium NOT DETECTED NOT DETECTED Final   Listeria monocytogenes NOT DETECTED NOT DETECTED Final   Staphylococcus species NOT DETECTED NOT DETECTED Final   Staphylococcus aureus (BCID) NOT DETECTED NOT DETECTED Final   Staphylococcus epidermidis NOT DETECTED NOT DETECTED Final   Staphylococcus lugdunensis NOT DETECTED NOT DETECTED Final   Streptococcus species NOT DETECTED NOT DETECTED Final   Streptococcus agalactiae NOT DETECTED NOT DETECTED Final   Streptococcus pneumoniae NOT DETECTED NOT DETECTED Final   Streptococcus pyogenes NOT DETECTED NOT DETECTED Final   A.calcoaceticus-baumannii NOT DETECTED NOT DETECTED Final   Bacteroides fragilis NOT DETECTED NOT DETECTED Final   Enterobacterales NOT DETECTED NOT DETECTED Final   Enterobacter cloacae complex NOT DETECTED NOT DETECTED Final   Escherichia coli NOT DETECTED NOT DETECTED Final   Klebsiella aerogenes NOT DETECTED NOT DETECTED Final   Klebsiella oxytoca NOT DETECTED NOT DETECTED Final   Klebsiella pneumoniae NOT DETECTED NOT DETECTED Final   Proteus species NOT DETECTED NOT DETECTED Final   Salmonella species NOT DETECTED NOT DETECTED Final   Serratia marcescens NOT DETECTED NOT DETECTED Final   Haemophilus influenzae NOT DETECTED NOT DETECTED Final   Neisseria meningitidis NOT DETECTED NOT DETECTED Final   Pseudomonas aeruginosa DETECTED (A) NOT DETECTED Final    Comment: CRITICAL  RESULT CALLED TO, READ BACK BY AND VERIFIED WITH: J. LEDFORD,PHARMD [id number] 07/05/2021 T. TYSOR    Stenotrophomonas maltophilia NOT DETECTED NOT DETECTED Final   Candida albicans NOT DETECTED NOT DETECTED Final   Candida auris NOT DETECTED NOT DETECTED Final   Candida glabrata NOT DETECTED NOT DETECTED Final   Candida krusei NOT DETECTED NOT DETECTED Final   Candida parapsilosis NOT DETECTED NOT DETECTED Final   Candida tropicalis NOT DETECTED NOT DETECTED Final   Cryptococcus neoformans/gattii NOT DETECTED NOT DETECTED Final   CTX-M ESBL NOT DETECTED NOT DETECTED Final   Carbapenem resistance IMP NOT DETECTED NOT DETECTED Final   Carbapenem resistance KPC NOT DETECTED NOT DETECTED Final   Carbapenem resistance NDM NOT DETECTED NOT DETECTED Final   Carbapenem resistance VIM NOT DETECTED NOT DETECTED Final    Comment: Performed at [facility], [address] [place], [place] [address]  Rexene Alberts, MSN, NP-C [facility]  [email or web address] Pager: [phone] Office: [phone] [facility] Main Line: [phone]

## 2021-07-05 NOTE — Care Management Important Message (Signed)
Important Message  Patient Details  Name: Lonnie [name] MRN: [id number] Date of Birth: [birth year]   Medicare Important Message Given:  Yes     Renie Ora 07/05/2021, 9:09 AM

## 2021-07-05 NOTE — Progress Notes (Signed)
PHARMACY - PHYSICIAN COMMUNICATION CRITICAL VALUE ALERT - BLOOD CULTURE IDENTIFICATION (BCID)  Lonnie [name] is an 70 y.o. male who presented to [facility] on 06/15/2021 with a chief complaint of sepsis   Name of physician (or Provider) Contacted: Dr Lowell Guitar  Current antibiotics: Vancomycin/Cefepime  Changes to prescribed antibiotics recommended:  Cont Cefepime Consider DC vancomycin soon  Results for orders placed or performed during the hospital encounter of 06/15/21  Blood Culture ID Panel (Reflexed) (Collected: 07/04/2021  8:53 AM)  Result Value Ref Range   Enterococcus faecalis NOT DETECTED NOT DETECTED   Enterococcus Faecium NOT DETECTED NOT DETECTED   Listeria monocytogenes NOT DETECTED NOT DETECTED   Staphylococcus species NOT DETECTED NOT DETECTED   Staphylococcus aureus (BCID) NOT DETECTED NOT DETECTED   Staphylococcus epidermidis NOT DETECTED NOT DETECTED   Staphylococcus lugdunensis NOT DETECTED NOT DETECTED   Streptococcus species NOT DETECTED NOT DETECTED   Streptococcus agalactiae NOT DETECTED NOT DETECTED   Streptococcus pneumoniae NOT DETECTED NOT DETECTED   Streptococcus pyogenes NOT DETECTED NOT DETECTED   A.calcoaceticus-baumannii NOT DETECTED NOT DETECTED   Bacteroides fragilis NOT DETECTED NOT DETECTED   Enterobacterales NOT DETECTED NOT DETECTED   Enterobacter cloacae complex NOT DETECTED NOT DETECTED   Escherichia coli NOT DETECTED NOT DETECTED   Klebsiella aerogenes NOT DETECTED NOT DETECTED   Klebsiella oxytoca NOT DETECTED NOT DETECTED   Klebsiella pneumoniae NOT DETECTED NOT DETECTED   Proteus species NOT DETECTED NOT DETECTED   Salmonella species NOT DETECTED NOT DETECTED   Serratia marcescens NOT DETECTED NOT DETECTED   Haemophilus influenzae NOT DETECTED NOT DETECTED   Neisseria meningitidis NOT DETECTED NOT DETECTED   Pseudomonas aeruginosa DETECTED (A) NOT DETECTED   Stenotrophomonas maltophilia NOT DETECTED NOT DETECTED   Candida  albicans NOT DETECTED NOT DETECTED   Candida auris NOT DETECTED NOT DETECTED   Candida glabrata NOT DETECTED NOT DETECTED   Candida krusei NOT DETECTED NOT DETECTED   Candida parapsilosis NOT DETECTED NOT DETECTED   Candida tropicalis NOT DETECTED NOT DETECTED   Cryptococcus neoformans/gattii NOT DETECTED NOT DETECTED   CTX-M ESBL NOT DETECTED NOT DETECTED   Carbapenem resistance IMP NOT DETECTED NOT DETECTED   Carbapenem resistance KPC NOT DETECTED NOT DETECTED   Carbapenem resistance NDM NOT DETECTED NOT DETECTED   Carbapenem resistance VIM NOT DETECTED NOT DETECTED    Abran Duke 07/05/2021  7:22 AM

## 2021-07-05 NOTE — Progress Notes (Signed)
Notified on-call physician of critical hgb 6.7. Patient with no obvious bleeding and no distress. Will continue to monitor.

## 2021-07-05 NOTE — Progress Notes (Signed)
Occupational Therapy Treatment Patient Details Name: Lonnie [name] MRN: [id number] DOB: Apr 26, 1951 Today's Date: 07/05/2021    History of present illness Lonnie [name] is a 70 y.o. male who underwent peripheral vascular thrombectomy of R LE 6/17 and Was discharged to SNF.  Pt readmitted 6/25 due to AMS, AKI, metabolic encephalopathy.  He was found to be COVID +.  He underwent exploration of Rt below knee popliteal wound and noted with  acute ischemic wounds and gangrene Rt foot.  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 Medical history significant for dementia, peripheral artery disease, tobacco use disorder, chronic depression/anxiety, iron deficiency anemia, peripheral neuropathy, essential hypertension, hyperlipidemia, GERD, BPH, falls, SDH.   OT comments  Pt making excellent progress towards OT goals and eager to participate. Pt receiving blood during session with BP stable throughout. Session focused on scoot transfer training to recliner with pt able to complete with min guard to maintain balance. Pt able to complete various UB ADLs with Setup Assist once in recliner. Provided UE HEP and theraband to maximize UB strength with plans to assess pt carryover in next session. Plan to also progress BSC transfers/toileting tasks. Continue to recommend SNF rehab at DC to maximize safety with daily tasks.    Follow Up Recommendations  SNF;Supervision/Assistance - 24 hour    Equipment Recommendations  3 in 1 bedside commode;Wheelchair (measurements OT);Wheelchair cushion (measurements OT)    Recommendations for Other Services      Precautions / Restrictions Precautions Precautions: Fall Precaution Comments: impaired vision, R AKA Restrictions Weight Bearing Restrictions: Yes RLE Weight Bearing: Non weight bearing       Mobility Bed Mobility Overal bed mobility: Needs Assistance Bed Mobility: Supine to Sit     Supine to sit:  Supervision;HOB elevated     General bed mobility comments: no assist needed, able to come to long sitting without assist. cues for scooting hips forward    Transfers Overall transfer level: Needs assistance Equipment used: 1 person hand held assist Transfers: Lateral/Scoot Transfers          Lateral/Scoot Transfers: Min guard General transfer comment: min guard for safety/steadying though no physical assist needed to advance to recliner    Balance Overall balance assessment: Needs assistance Sitting-balance support: Feet supported Sitting balance-Leahy Scale: Fair Sitting balance - Comments: LOB with challenges of scooting along bedside but able to correct self                                   ADL either performed or assessed with clinical judgement   ADL Overall ADL's : Needs assistance/impaired     Grooming: Set up;Sitting;Oral care;Wash/dry face Grooming Details (indicate cue type and reason): sitting in recliner after transfer, no issues sequencing             Lower Body Dressing: Maximal assistance;Sitting/lateral leans;Bed level Lower Body Dressing Details (indicate cue type and reason): Likely assist at baseline for donning socks. Would need +2 assist if LB dressing attempted in standing               General ADL Comments: Session focused on trial of scoot transfer to recliner with pt able to complete via limited assist. Provided UE HEP with theraband and brief instruction on exercises (follow-up needed).     Vision   Vision Assessment?: Vision impaired- to be further tested in functional context  Perception     Praxis      Cognition Arousal/Alertness: Awake/alert Behavior During Therapy: WFL for tasks assessed/performed Overall Cognitive Status: History of cognitive impairments - at baseline Area of Impairment: Safety/judgement;Following commands                       Following Commands: Follows one step commands with  increased time;Follows multi-step commands with increased time Safety/Judgement: Decreased awareness of safety;Decreased awareness of deficits     General Comments: Hx of dementia, pleasant and talkative, increased time for following directions, safety and awareness.        Exercises     Shoulder Instructions       General Comments BP soft but stable, pt receiving blood during session    Pertinent Vitals/ Pain       Pain Assessment: 0-10 Pain Score: 4  Pain Location: R residual limb Pain Descriptors / Indicators: Sore;Grimacing Pain Intervention(s): Monitored during session  Home Living                                          Prior Functioning/Environment              Frequency  Min 2X/week        Progress Toward Goals  OT Goals(current goals can now be found in the care plan section)  Progress towards OT goals: Progressing toward goals  Acute Rehab OT Goals Patient Stated Goal: go home OT Goal Formulation: With patient Time For Goal Achievement: 07/17/21 Potential to Achieve Goals: Good ADL Goals Pt Will Perform Grooming: with modified independence;standing Pt Will Transfer to Toilet: with min assist;squat pivot transfer;stand pivot transfer;bedside commode Pt Will Perform Toileting - Clothing Manipulation and hygiene: with min assist;sitting/lateral leans Additional ADL Goal #1: Pt to complete sit to stand transfers with Min A in prep for ADL tasks  Plan Discharge plan remains appropriate    Co-evaluation                 AM-PAC OT "6 Clicks" Daily Activity     Outcome Measure   Help from another person eating meals?: None Help from another person taking care of personal grooming?: A Little Help from another person toileting, which includes using toliet, bedpan, or urinal?: A Lot Help from another person bathing (including washing, rinsing, drying)?: A Lot Help from another person to put on and taking off regular upper body  clothing?: A Little Help from another person to put on and taking off regular lower body clothing?: A Lot 6 Click Score: 16    End of Session Equipment Utilized During Treatment: Gait belt  OT Visit Diagnosis: Unsteadiness on feet (R26.81);Other abnormalities of gait and mobility (R26.89);Muscle weakness (generalized) (M62.81);Pain;Other symptoms and signs involving cognitive function Pain - Right/Left: Right Pain - part of body: Leg   Activity Tolerance Patient tolerated treatment well   Patient Left in chair;with call bell/phone within reach;with chair alarm set   Nurse Communication Mobility status        Time: 2297-9892 OT Time Calculation (min): 29 min  Charges: OT General Charges $OT Visit: 1 Visit OT Treatments $Self Care/Home Management : 8-22 mins $Therapeutic Activity: 8-22 mins  Bradd Canary, OTR/L Acute Rehab Services Office: [phone]    Lorre Munroe 07/05/2021, 1:03 PM

## 2021-07-05 NOTE — Progress Notes (Signed)
OT Cancellation Note  Patient Details Name: Lonnie [name] MRN: [id number] DOB: [birth year]   Cancelled Treatment:    Reason Eval/Treat Not Completed: Other (comment) RN about to began blood transfusion and place foley cath, requesting to check back for OT session later if possible. Will follow-up as schedule permits.   Lorre Munroe 07/05/2021, 10:44 AM

## 2021-07-05 NOTE — Progress Notes (Signed)
Called by RN who reported that Hgb has fallen to 6.7. Reviewing notes he did receive PRBC a few days ago.  No active bleeding at this time.  Transfuse 2 units of PRBC. Orders placed.

## 2021-07-05 NOTE — Progress Notes (Signed)
PROGRESS NOTE    Lonnie [name]  ZOX:[id number] DOB: 1951-08-12 DOA: 06/15/2021 PCP: Wanda PlumpPaz, Jose E, MD   No chief complaint on file.  Brief Narrative:  70 yo with hx CAD s/p CABG x4 2014, subdural hematoma s/p craniotomy (2020, with residual balance deficits and delayed processing), ischemic cardiomyopathy, right popliteal bypass with PTFE graft 01/23/2020, recent admission 06/07/21 with occluded R fem pop bypass s/p thrombolysis, mechanical thrombectomy of R femoropoliteal bypass graft, stent x2 of the right femoral bypass graft.  He was readmitted 6/25 with weakness and confusion and found to have bladder rentention and possible UTI (multiple species).  His covid testing was positive and he was treated with steroids and remdesivir.  He developed spontaneous bleeding on 7/6 from prior area of R femoropopliteal bypass and then large volume bleeding with hemorrhagic shock on 7/7.  He was taken to the OR by Dr. Chestine Sporelark on 7/7 for exploration of R below knee popliteal wound, repair of R popliteal vein, ligation of R popliteal artery, and placement of negative pressure wound vac.  He had right AKA on 7/12 with gangrene of R foot.  Assessment & Plan:   Active Problems:   Acute metabolic encephalopathy   AKI (acute kidney injury) (HCC)   Status post above-knee amputation of right lower extremity (HCC)   Altered mental status  Sepsis secondary to pseudomonas bacteremia Graft segment sent for culture with moderate pseudomonas 7/12 UA bland, CXR without acute disease RUQ US with mild fatty infiltration of the liver 2/2 blood cultures with gram negative rods, BCID with pseudomonas Follow repeat blood cultures Vascular following appreciate recs - considering removal of remaining portion of graft ID c/s, appreciate recs Echo with EF 50%, RWMA  Elevated Liver Enzymes  Fatty Liver  Likely shock liver, improving  Hepatocellular pattern of liver injury Minimize meds which could contribute to liver  injury RUQ with mild fatty infiltration of liver  Acute Metabolic Encephalopathy Suspect related to above - significantly improved today Delirium precautions W/u further as indicated  Acute Blood Loss Anemia with Hypovolemic Shock Occluded Right Femoropopliteal Bypass s/p lysis complicated by bleeding with disruption and reocclusion of graft S/p right above knee amputation  S/p exploration of R below knee popliteal wound, repair of R popliteal vein, ligation of right popliteal artery and placement of negative pressure wound vac on 7/7 S/p right AKA 7/12 Wound cx with pseudomonas as above S/p 2 units pRBC (I see 3 units ordered, only 2 documented as being completed, 3rd as "transfusing" - unclear) Receiving additional 2 units today - suspect dilutional drop with IVF yesterday - follow  Continue aspirin, plavix Gabapentin for phantom pain  Hb relatively stable today, follow  Leukocytosis See sepsis  Severe PAD  CAD s/p CABG x4 Aspirin, plavix Not currently on statin, unclear reason, will review (elevated LFT's at this time, follow, consider if improved) Continue metoprolol F/u with cardiology and vascular outpatient   LUTS  BPH S/p foley on admission D/c foley as noted above - follow bladder scans Needs urology f/u outpatient Flomax, bethanecol  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 Celexa 20 mg, seroquel 100 mg BID  Acute Metabolice Encephalopathy Thought related to covid vs urinary retention? Delirium precautions  Systolic  Diastolic HF Appears compensated, follow   Elevated LFT's  Hepatic Steatosis Unclear etiology - COVID? Steatosis? Hemodynamic shifts? Negative acute hepatitis panel  COVID 19 Virus infection S/p remdesivir/decadron  DVT prophylaxis: SCD Code Status: dnr Family Communication: none at bedside Disposition:  Status is: Inpatient  Remains inpatient appropriate because:Inpatient level of care  appropriate due to severity of illness  Dispo:  Patient From:    Planned Disposition: Skilled Nursing Facility  Medically stable for discharge:           Consultants:  vascular  Procedures:  LE [place] Summary:  RIGHT:  - There is no evidence of deep vein thrombosis in the lower extremity.     - No cystic structure found in the popliteal fossa.     - Incidental: Extensive fluid surrounding stent at popliteal fossa. Flow  is identified within the stent at the level of the popliteal.   - Ultrasound characteristics of enlarged lymph nodes are noted in the  groin.     LEFT:  - There is no evidence of deep vein thrombosis in the lower extremity.     - No cystic structure found in the popliteal fossa.      Antimicrobials:  Anti-infectives (From admission, onward)    Start     Dose/Rate Route Frequency Ordered Stop   07/04/21 2200  vancomycin (VANCOCIN) IVPB 1000 mg/200 mL premix  Status:  Discontinued        1,000 mg 200 mL/hr over 60 Minutes Intravenous Every 12 hours 07/04/21 0906 07/05/21 0802   07/04/21 1000  vancomycin (VANCOREADY) IVPB 1500 mg/300 mL        1,500 mg 150 mL/hr over 120 Minutes Intravenous  Once 07/04/21 0906 07/04/21 1348   07/04/21 1000  ceFEPIme (MAXIPIME) 2 g in sodium chloride 0.9 % 100 mL IVPB        2 g 200 mL/hr over 30 Minutes Intravenous Every 8 hours 07/04/21 0906     07/02/21 2200  ceFAZolin (ANCEF) IVPB 1 g/50 mL premix  Status:  Discontinued        1 g 100 mL/hr over 30 Minutes Intravenous Every 8 hours 07/02/21 1816 07/04/21 0822   06/17/21 1400  ceFEPIme (MAXIPIME) 2 g in sodium chloride 0.9 % 100 mL IVPB        2 g 200 mL/hr over 30 Minutes Intravenous Every 12 hours 06/17/21 1258 06/19/21 0955   06/17/21 1000  remdesivir 100 mg in sodium chloride 0.9 % 100 mL IVPB       See Hyperspace for full Linked Orders Report.   100 mg 200 mL/hr over 30 Minutes Intravenous Daily 06/16/21 0135 06/18/21 1026   06/16/21 2200  cefTRIAXone  (ROCEPHIN) 2 g in sodium chloride 0.9 % 100 mL IVPB  Status:  Discontinued        2 g 200 mL/hr over 30 Minutes Intravenous Every 24 hours 06/15/21 2304 06/17/21 1218   06/16/21 2200  vancomycin (VANCOREADY) IVPB 1000 mg/200 mL  Status:  Discontinued        1,000 mg 200 mL/hr over 60 Minutes Intravenous Every 12 hours 06/16/21 0820 06/17/21 1218   06/16/21 0915  vancomycin (VANCOREADY) IVPB 1750 mg/350 mL        1,750 mg 175 mL/hr over 120 Minutes Intravenous  Once 06/16/21 0820 06/16/21 1114   06/16/21 0230  remdesivir 200 mg in sodium chloride 0.9% 250 mL IVPB       See Hyperspace for full Linked Orders Report.   200 mg 580 mL/hr over 30 Minutes Intravenous Once 06/16/21 0135 06/16/21 0324   06/15/21 2230  linezolid (ZYVOX) IVPB 600 mg  Status:  Discontinued        600 mg 300 mL/hr over 60 Minutes Intravenous Every 12 hours 06/15/21  2228 06/16/21 0811   06/15/21 2115  cefTRIAXone (ROCEPHIN) 2 g in sodium chloride 0.9 % 100 mL IVPB        2 g 200 mL/hr over 30 Minutes Intravenous  Once 06/15/21 2114 06/15/21 2328          Subjective: No new complaints Feeling better  Objective: Vitals:   07/04/21 2300 07/05/21 0515 07/05/21 0535 07/05/21 0800  BP: (!) 103/52 (!) 102/57 99/60 (!) 108/53  Pulse: 79 62 (!) 57 (!) 59  Resp: 12   16  Temp: 98.8 F (37.1 C) 98.1 F (36.7 C) 98.2 F (36.8 C) 98.5 F (36.9 C)  TempSrc: Oral Axillary Axillary Oral  SpO2: 99% 100%  100%  Weight:      Height:        Intake/Output Summary (Last 24 hours) at 07/05/2021 0834 Last data filed at 07/05/2021 0800 Gross per 24 hour  Intake 1503.84 ml  Output 1000 ml  Net 503.84 ml   Filed Weights   07/02/21 0415 07/03/21 0300 07/04/21 0345  Weight: 78 kg 73.9 kg 72.1 kg    Examination:  General: No acute distress. Cardiovascular: RRR Lungs: unlabored Abdomen: Soft, nontender, nondistended  Neurological: Alert. Moves all extremities 4 . Cranial nerves II through XII grossly intact. Skin:  Warm and dry. No rashes or lesions. Extremities: R AKA    Data Reviewed: I have personally reviewed following labs and imaging studies  CBC: Recent Labs  Lab 06/30/21 0243 07/01/21 0123 07/02/21 0308 07/03/21 0209 07/04/21 0214 07/05/21 0143  WBC 17.5* 18.7* 15.7* 19.8* 27.8* 26.7*  NEUTROABS 12.3* 14.7* 12.1*  --  25.8* 23.1*  HGB 8.4* 8.6* 8.6* 7.9* 7.9* 6.7*  HCT 25.9* 27.5* 27.6* 25.6* 25.1* 21.4*  MCV 79.7* 81.6 81.4 82.1 80.2 81.1  PLT 527* 556* 505* 475* 455* 354    Basic Metabolic Panel: Recent Labs  Lab 07/01/21 0123 07/02/21 0308 07/03/21 0209 07/04/21 0214 07/05/21 0143  NA 135 134* 133* 132* 135  K 4.3 4.5 4.4 3.8 3.7  CL 103 101 100 99 104  CO2 GLUCOSE 105* 139* 123* 138* 133*  BUN CREATININE 0.72 0.80 0.63 0.89 0.90  CALCIUM 9.1 9.3 9.1 8.5* 8.6*  MG  --   --   --  1.9 2.1  PHOS  --   --   --  2.9 3.7    GFR: Estimated Creatinine Clearance: 80.1 mL/min (by C-G formula based on SCr of 0.9 mg/dL).  Liver Function Tests: Recent Labs  Lab 06/30/21 0243 07/01/21 0123 07/02/21 0308 07/04/21 0214 07/05/21 0143  AST 98* 138* 93* 1,255* 629*  ALT 142* 182* 162* 887* 771*  ALKPHOS 122 120 111 486* 323*  BILITOT 0.9 0.7 0.5 1.6* 1.7*  PROT 6.3* 6.4* 6.8 6.4* 5.8*  ALBUMIN 2.4* 2.3* 2.4* 2.4* 2.0*    CBG: Recent Labs  Lab 07/04/21 1721  GLUCAP 128*     Recent Results (from the past 240 hour(s))  Aerobic Culture w Gram Stain (superficial specimen)     Status: None (Preliminary result)   Collection Time: 07/02/21  4:13 PM   Specimen: PATH Other; Tissue  Result Value Ref Range Status   Specimen Description WOUND RIGHT LEG  Final   Special Requests BYPASS  GRAFT PT ON ANCEF  Final   Gram Stain   Final    MODERATE WBC PRESENT,BOTH PMN AND MONONUCLEAR NO ORGANISMS SEEN    Culture   Final  MODERATE PSEUDOMONAS AERUGINOSA SUSCEPTIBILITIES TO FOLLOW Performed at [facility], [address],  [place], [place] [address]    Report Status PENDING  Incomplete  Culture, blood (routine x 2)     Status: None (Preliminary result)   Collection Time: 07/04/21  8:49 AM   Specimen: BLOOD LEFT ARM  Result Value Ref Range Status   Specimen Description BLOOD LEFT ARM  Final   Special Requests   Final    BOTTLES DRAWN AEROBIC ONLY Blood Culture adequate volume   Culture  Setup Time   Final    GRAM NEGATIVE RODS AEROBIC BOTTLE ONLY CRITICAL VALUE NOTED.  VALUE IS CONSISTENT WITH PREVIOUSLY REPORTED AND CALLED VALUE. Performed at [facility], [address] [place], [place] [address]    Culture GRAM NEGATIVE RODS  Final   Report Status PENDING  Incomplete  Culture, blood (routine x 2)     Status: None (Preliminary result)   Collection Time: 07/04/21  8:53 AM   Specimen: BLOOD RIGHT HAND  Result Value Ref Range Status   Specimen Description BLOOD RIGHT HAND  Final   Special Requests   Final    BOTTLES DRAWN AEROBIC ONLY Blood Culture results may not be optimal due to an inadequate volume of blood received in culture bottles   Culture  Setup Time   Final    GRAM NEGATIVE RODS AEROBIC BOTTLE ONLY CRITICAL RESULT CALLED TO, READ BACK BY AND VERIFIED WITH: Dorthy Cooler [phone] 07/05/2021 Girtha Hake Performed at [facility], [address], [place], [place] [address]    Culture GRAM NEGATIVE RODS  Final   Report Status PENDING  Incomplete  Blood Culture ID Panel (Reflexed)     Status: Abnormal   Collection Time: 07/04/21  8:53 AM  Result Value Ref Range Status   Enterococcus faecalis NOT DETECTED NOT DETECTED Final   Enterococcus Faecium NOT DETECTED NOT DETECTED Final   Listeria monocytogenes NOT DETECTED NOT DETECTED Final   Staphylococcus species NOT DETECTED NOT DETECTED Final   Staphylococcus aureus (BCID) NOT DETECTED NOT DETECTED Final   Staphylococcus epidermidis NOT DETECTED NOT DETECTED Final   Staphylococcus lugdunensis NOT DETECTED NOT DETECTED Final    Streptococcus species NOT DETECTED NOT DETECTED Final   Streptococcus agalactiae NOT DETECTED NOT DETECTED Final   Streptococcus pneumoniae NOT DETECTED NOT DETECTED Final   Streptococcus pyogenes NOT DETECTED NOT DETECTED Final   Kodee Ravert.calcoaceticus-baumannii NOT DETECTED NOT DETECTED Final   Bacteroides fragilis NOT DETECTED NOT DETECTED Final   Enterobacterales NOT DETECTED NOT DETECTED Final   Enterobacter cloacae complex NOT DETECTED NOT DETECTED Final   Escherichia coli NOT DETECTED NOT DETECTED Final   Klebsiella aerogenes NOT DETECTED NOT DETECTED Final   Klebsiella oxytoca NOT DETECTED NOT DETECTED Final   Klebsiella pneumoniae NOT DETECTED NOT DETECTED Final   Proteus species NOT DETECTED NOT DETECTED Final   Salmonella species NOT DETECTED NOT DETECTED Final   Serratia marcescens NOT DETECTED NOT DETECTED Final   Haemophilus influenzae NOT DETECTED NOT DETECTED Final   Neisseria meningitidis NOT DETECTED NOT DETECTED Final   Pseudomonas aeruginosa DETECTED (Benigna Delisi) NOT DETECTED Final    Comment: CRITICAL RESULT CALLED TO, READ BACK BY AND VERIFIED WITH: J. LEDFORD,PHARMD [id number] 07/05/2021 T. TYSOR    Stenotrophomonas maltophilia NOT DETECTED NOT DETECTED Final   Candida albicans NOT DETECTED NOT DETECTED Final   Candida auris NOT DETECTED NOT DETECTED Final   Candida glabrata NOT DETECTED NOT DETECTED Final   Candida krusei  NOT DETECTED NOT DETECTED Final   Candida parapsilosis NOT DETECTED NOT DETECTED Final   Candida tropicalis NOT DETECTED NOT DETECTED Final   Cryptococcus neoformans/gattii NOT DETECTED NOT DETECTED Final   CTX-M ESBL NOT DETECTED NOT DETECTED Final   Carbapenem resistance IMP NOT DETECTED NOT DETECTED Final   Carbapenem resistance KPC NOT DETECTED NOT DETECTED Final   Carbapenem resistance NDM NOT DETECTED NOT DETECTED Final   Carbapenem resistance VIM NOT DETECTED NOT DETECTED Final    Comment: Performed at [facility], [address], [place],  [place] [address]         Radiology Studies: DG CHEST PORT 1 VIEW  Result Date: 07/04/2021 CLINICAL DATA:  Fever today. EXAM: PORTABLE CHEST 1 VIEW COMPARISON:  Single-view of the chest 06/26/2021 and 09/10/2020 FINDINGS: Heart size is upper normal. The patient is status post CABG. Lungs clear. No pneumothorax or pleural fluid. No acute or focal bony abnormality. IMPRESSION: No acute disease. Electronically Signed   By: Drusilla Kanner M.D.   On: 07/04/2021 10:45   ECHOCARDIOGRAM COMPLETE  Result Date: 07/04/2021    ECHOCARDIOGRAM REPORT   Patient Name:   Lonnie [name] Date of Exam: 07/04/2021 Medical Rec #:  [id number]          Height:       74.0 in Accession #:    [id number]         Weight:       159.0 lb Date of Birth:  05-19-1951          BSA:          1.971 m Patient Age:    70 years           BP:           114/63 mmHg Patient Gender: M                  HR:           95 bpm. Exam Location:  Inpatient Procedure: 2D Echo, Cardiac Doppler and Color Doppler Indications:    CHF  History:        Patient has no prior history of Echocardiogram examinations,                 most recent 05/12/2017. CAD and Previous Myocardial Infarction;                 Risk Factors:Former Smoker and Hypertension.  Sonographer:    Shirlean Kelly Referring Phys: [phone] Marguerette Sheller CALDWELL POWELL JR  Sonographer Comments: Image acquisition challenging due to uncooperative patient and patient is confused. IMPRESSIONS  1. Left ventricular ejection fraction, by estimation, is 50%. The left ventricle has mildly decreased function. The left ventricle demonstrates regional wall motion abnormalities with basal to mid inferior hypokinesis. There is mild left ventricular hypertrophy. Left ventricular diastolic parameters are consistent with Grade I diastolic dysfunction (impaired relaxation).  2. Right ventricular systolic function is normal. The right ventricular size is normal. Tricuspid regurgitation signal is inadequate for assessing PA  pressure.  3. The mitral valve is normal in structure. Trivial mitral valve regurgitation. No evidence of mitral stenosis.  4. The aortic valve is tricuspid. Aortic valve regurgitation is not visualized. Mild aortic valve sclerosis is present, with no evidence of aortic valve stenosis.  5. The inferior vena cava is normal in size with greater than 50% respiratory variability, suggesting right atrial pressure of 3 mmHg. FINDINGS  Left Ventricle: Left ventricular ejection fraction, by  estimation, is 50%. The left ventricle has mildly decreased function. The left ventricle demonstrates regional wall motion abnormalities. The left ventricular internal cavity size was normal in size. There is mild left ventricular hypertrophy. Left ventricular diastolic parameters are consistent with Grade I diastolic dysfunction (impaired relaxation). Right Ventricle: The right ventricular size is normal. No increase in right ventricular wall thickness. Right ventricular systolic function is normal. Tricuspid regurgitation signal is inadequate for assessing PA pressure. Left Atrium: Left atrial size was normal in size. Right Atrium: Right atrial size was normal in size. Pericardium: There is no evidence of pericardial effusion. Mitral Valve: The mitral valve is normal in structure. Trivial mitral valve regurgitation. No evidence of mitral valve stenosis. Tricuspid Valve: The tricuspid valve is normal in structure. Tricuspid valve regurgitation is not demonstrated. Aortic Valve: The aortic valve is tricuspid. Aortic valve regurgitation is not visualized. Mild aortic valve sclerosis is present, with no evidence of aortic valve stenosis. Aortic valve mean gradient measures 9.0 mmHg. Aortic valve peak gradient measures 16.6 mmHg. Aortic valve area, by VTI measures 2.78 cm. Pulmonic Valve: The pulmonic valve was normal in structure. Pulmonic valve regurgitation is trivial. Aorta: The aortic root is normal in size and structure. Venous: The  inferior vena cava is normal in size with greater than 50% respiratory variability, suggesting right atrial pressure of 3 mmHg. IAS/Shunts: No atrial level shunt detected by color flow Doppler.  LEFT VENTRICLE PLAX 2D LVIDd:         4.80 cm  Diastology LVIDs:         3.40 cm  LV e' medial:    11.90 cm/s LV PW:         1.10 cm  LV E/e' medial:  5.7 LV IVS:        1.20 cm  LV e' lateral:   16.50 cm/s LVOT diam:     2.20 cm  LV E/e' lateral: 4.1 LV SV:         90 LV SV Index:   46 LVOT Area:     3.80 cm  IVC IVC diam: 1.80 cm LEFT ATRIUM             Index       RIGHT ATRIUM           Index LA diam:        3.50 cm 1.78 cm/m  RA Area:     17.50 cm LA Vol (A2C):   60.2 ml 30.54 ml/m RA Volume:   42.50 ml  21.56 ml/m LA Vol (A4C):   44.5 ml 22.57 ml/m LA Biplane Vol: 53.9 ml 27.34 ml/m  AORTIC VALVE AV Area (Vmax):    2.66 cm AV Area (Vmean):   2.63 cm AV Area (VTI):     2.78 cm AV Vmax:           204.00 cm/s AV Vmean:          136.000 cm/s AV VTI:            0.324 m AV Peak Grad:      16.6 mmHg AV Mean Grad:      9.0 mmHg LVOT Vmax:         143.00 cm/s LVOT Vmean:        94.000 cm/s LVOT VTI:          0.237 m LVOT/AV VTI ratio: 0.73  AORTA Ao Root diam: 3.70 cm Ao Asc diam:  3.40 cm MITRAL VALVE MV Area (PHT): 4.60 cm  SHUNTS MV Decel Time: 165 msec    Systemic VTI:  0.24 m MV E velocity: 68.10 cm/s  Systemic Diam: 2.20 cm MV Ennifer Harston velocity: 89.60 cm/s MV E/Caydee Talkington ratio:  0.76 Marca Ancona MD Electronically signed by Marca Ancona MD Signature Date/Time: 07/04/2021/5:06:09 PM    Final    US Abdomen Limited RUQ (LIVER/GB)  Result Date: 07/05/2021 CLINICAL DATA:  History of hepatitis EXAM: ULTRASOUND ABDOMEN LIMITED RIGHT UPPER QUADRANT COMPARISON:  06/18/2021 FINDINGS: Gallbladder: No gallstones or wall thickening visualized. No sonographic Murphy sign noted by sonographer. Common bile duct: Diameter: 3.4 mm Liver: Mild increased echogenicity is noted similar to that seen on the prior exam likely representing fatty  infiltration. No focal mass is noted. Portal vein is patent on color Doppler imaging with normal direction of blood flow towards the liver. Other: None. IMPRESSION: Mild fatty infiltration of the liver. No other focal abnormality is noted. Electronically Signed   By: Alcide Clever M.D.   On: 07/05/2021 01:56        Scheduled Meds:  sodium chloride   Intravenous Once   sodium chloride   Intravenous Once   aspirin EC  81 mg Oral Daily   bethanechol  10 mg Oral TID   Chlorhexidine Gluconate Cloth  6 each Topical Daily   citalopram  20 mg Oral Daily   clopidogrel  75 mg Oral Daily   diphenhydrAMINE  25 mg Oral Once   ferrous fumarate-b12-vitamic C-folic acid  1 capsule Oral Daily   gabapentin  100 mg Oral Daily   gabapentin  300 mg Oral QHS   melatonin  10 mg Oral QHS   metoprolol tartrate  12.5 mg Oral BID   multivitamin with minerals  1 tablet Oral Daily   nicotine  7 mg Transdermal Daily   pantoprazole  40 mg Oral QPM   QUEtiapine  100 mg Oral BID   senna-docusate  1 tablet Oral BID   sodium chloride flush  10-40 mL Intracatheter Q12H   tamsulosin  0.8 mg Oral QPM   Continuous Infusions:  ceFEPime (MAXIPIME) IV 2 g (07/04/21 2200)   lactated ringers       LOS: 19 days    Time spent: over 30 min    Lacretia Nicks, MD [facility]   To contact the attending provider between 7A-7P or the covering provider during after hours 7P-7A, please log into the web site [email or web address] and access using universal [place] password for that web site. If you do not have the password, please call the hospital operator.  07/05/2021, 8:34 AM

## 2021-07-05 NOTE — Progress Notes (Signed)
[facility]  Subjective  - less confused this morning   Objective 99/60 (!) 57 98.2 F (36.8 C) (Axillary) 12 100%  Intake/Output Summary (Last 24 hours) at 07/05/2021 4585 Last data filed at 07/05/2021 9292 Gross per 24 hour  Intake 1188.84 ml  Output 1000 ml  Net 188.84 ml   Right aka some edema no real drainage no erythema in thigh or groin right leg  Assessment/Planning: Looks a little better today less febrile less tachycardic Would consult ID today for antibiotic duration Urine Biliary tract lungs with no obvious source of infection Will consider removal of remaining portion of graft if does not improve with antibiotics or has decline in clinical condition or pointing signs of infection  Will follow    Fabienne Bruns 07/05/2021 7:28 AM --  Laboratory Lab Results: Recent Labs    07/04/21 0214 07/05/21 0143  WBC 27.8* 26.7*  HGB 7.9* 6.7*  HCT 25.1* 21.4*  PLT 455* 354   BMET Recent Labs    07/04/21 0214 07/05/21 0143  NA 132* 135  K 3.8 3.7  CL 99 104  CO2 23 25  GLUCOSE 138* 133*  BUN 14 13  CREATININE 0.89 0.90  CALCIUM 8.5* 8.6*    COAG Lab Results  Component Value Date   INR 1.3 (H) 07/04/2021   INR 1.1 06/27/2021   INR 1.1 09/10/2020   No results found for: PTT

## 2021-07-06 DIAGNOSIS — R7881 Bacteremia: Secondary | ICD-10-CM | POA: Diagnosis not present

## 2021-07-06 DIAGNOSIS — B965 Pseudomonas (aeruginosa) (mallei) (pseudomallei) as the cause of diseases classified elsewhere: Secondary | ICD-10-CM | POA: Diagnosis not present

## 2021-07-06 LAB — COMPREHENSIVE METABOLIC PANEL
ALT: 391 U/L — ABNORMAL HIGH (ref 0–44)
AST: 147 U/L — ABNORMAL HIGH (ref 15–41)
Albumin: 1.9 g/dL — ABNORMAL LOW (ref 3.5–5.0)
Alkaline Phosphatase: 235 U/L — ABNORMAL HIGH (ref 38–126)
Anion gap: 6 (ref 5–15)
BUN: 15 mg/dL (ref 8–23)
CO2: 24 mmol/L (ref 22–32)
Calcium: 8.3 mg/dL — ABNORMAL LOW (ref 8.9–10.3)
Chloride: 106 mmol/L (ref 98–111)
Creatinine, Ser: 0.77 mg/dL (ref 0.61–1.24)
GFR, Estimated: >60 mL/min (ref >60)
Glucose, Bld: 105 mg/dL — ABNORMAL HIGH (ref 70–99)
Potassium: 3.9 mmol/L (ref 3.5–5.1)
Sodium: 136 mmol/L (ref 135–145)
Total Bilirubin: 1.3 mg/dL — ABNORMAL HIGH (ref 0.3–1.2)
Total Protein: 5.5 g/dL — ABNORMAL LOW (ref 6.5–8.1)

## 2021-07-06 LAB — BPAM RBC
Blood Product Expiration Date: 202208112359
Blood Product Expiration Date: 202208112359
ISSUE DATE / TIME: 202207150509
ISSUE DATE / TIME: 202207151020
Unit Type and Rh: 5100
Unit Type and Rh: 5100

## 2021-07-06 LAB — CBC WITH DIFFERENTIAL/PLATELET
Abs Immature Granulocytes: 0.2 10*3/uL — ABNORMAL HIGH (ref 0.00–0.07)
Basophils Absolute: 0.1 10*3/uL (ref 0.0–0.1)
Basophils Relative: 0 %
Eosinophils Absolute: 0.2 10*3/uL (ref 0.0–0.5)
Eosinophils Relative: 1 %
HCT: 29.2 % — ABNORMAL LOW (ref 39.0–52.0)
Hemoglobin: 9.4 g/dL — ABNORMAL LOW (ref 13.0–17.0)
Immature Granulocytes: 1 %
Lymphocytes Relative: 7 %
Lymphs Abs: 1.5 10*3/uL (ref 0.7–4.0)
MCH: 26.4 pg (ref 26.0–34.0)
MCHC: 32.2 g/dL (ref 30.0–36.0)
MCV: 82 fL (ref 80.0–100.0)
Monocytes Absolute: 1.3 10*3/uL — ABNORMAL HIGH (ref 0.1–1.0)
Monocytes Relative: 6 %
Neutro Abs: 17.5 10*3/uL — ABNORMAL HIGH (ref 1.7–7.7)
Neutrophils Relative %: 85 %
Platelets: 334 10*3/uL (ref 150–400)
RBC: 3.56 MIL/uL — ABNORMAL LOW (ref 4.22–5.81)
RDW: 20.2 % — ABNORMAL HIGH (ref 11.5–15.5)
WBC: 20.7 10*3/uL — ABNORMAL HIGH (ref 4.0–10.5)
nRBC: 0 % (ref 0.0–0.2)

## 2021-07-06 LAB — TYPE AND SCREEN
ABO/RH(D): O POS
Antibody Screen: NEGATIVE
Unit division: 0
Unit division: 0

## 2021-07-06 LAB — URINE CULTURE: Culture: NO GROWTH

## 2021-07-06 LAB — MAGNESIUM: Magnesium: 2 mg/dL (ref 1.7–2.4)

## 2021-07-06 LAB — PHOSPHORUS: Phosphorus: 2.4 mg/dL — ABNORMAL LOW (ref 2.5–4.6)

## 2021-07-06 NOTE — Progress Notes (Signed)
Physical Therapy Treatment Patient Details Name: Lonnie [name] MRN: [id number] DOB: 05/12/1951 Today's Date: 07/06/2021    History of Present Illness Lonnie [name] is a 70 y.o. male who underwent peripheral vascular thrombectomy of R LE and graft placement 6/17 and discharged to home. Pt readmitted 6/25 due to AMS, AKI, metabolic encephalopathy. He was found to be COVID+. He underwent exploration of R below knee popliteal wound and noted with acute ischemic wounds and gangrene R foot as well as graft infection. 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 PMH: dementia, PAD, tobacco use disorder, chronic depression/anxiety, impaired vision, iron deficiency anemia, peripheral neuropathy, essential HTN, HLD, GERD, BPH, falls, SDH s/p crani (2020).    PT Comments    Pt received in supine, agreeable to therapy session and with good participation in transfer training and therapeutic exercises. Pt denies pain after premedication with gabapentin and good 1 and 2-step command following this date. Pt quick to fatigue with RW while pivoting and needs heavy +2 assist mod/maxA for stand pivot transfer, but able to stand from chair with Stedy and +2 min/modA. Pt continues to benefit from PT services to progress toward functional mobility goals. Chair alarm activated for safety and pt encouraged to sit up at least 1-2 hours.  Follow Up Recommendations  SNF;Supervision/Assistance - 24 hour     Equipment Recommendations  Other (comment);None recommended by PT (defer to post-acute location)    Recommendations for Other Services       Precautions / Restrictions Precautions Precautions: Fall Precaution Comments: impaired vision, R AKA Restrictions Weight Bearing Restrictions: Yes RLE Weight Bearing: Non weight bearing    Mobility  Bed Mobility Overal bed mobility: Needs Assistance Bed Mobility: Supine to Sit     Supine to sit: Supervision;HOB elevated      General bed mobility comments: no assist needed, able to come to long sitting without assist. cues for scooting hips forward.    Transfers Overall transfer level: Needs assistance Equipment used: Rolling walker (2 wheeled) Transfers: Sit to/from [employer] Sit to Stand: Min assist;+2 safety/equipment Stand pivot transfers: Max assist;+2 physical assistance       General transfer comment: pt with posterior LOB toward end of stand pivot with RW needing +2 maxA for eccentric control to sit in chair, pt does better standing from chair<>Stedy needing +2 minA to stand and +17modA for controlled lowering. Encouraged RN to use Stedy vs lateral seated scoot for return transfer later in day.  Ambulation/Gait                 Stairs             Wheelchair Mobility    Modified Rankin (Stroke Patients Only)       Balance Overall balance assessment: Needs assistance Sitting-balance support: Feet supported Sitting balance-Leahy Scale: Fair Sitting balance - Comments: static sitting and weight shifting EOB no LOB Postural control: Posterior lean Standing balance support: Bilateral upper extremity supported;During functional activity Standing balance-Leahy Scale: Poor (poor to zero) Standing balance comment: reliant on BUE support due to R AKA + external support, posterior LOB during pivot transfer and unable to hop so deferred further gait trials                            Cognition Arousal/Alertness: Awake/alert Behavior During Therapy: WFL for tasks assessed/performed Overall Cognitive Status: History of cognitive impairments - at baseline Area  of Impairment: Safety/judgement;Following commands                   Current Attention Level: Selective Memory: Decreased short-term memory Following Commands: Follows one step commands with increased time;Follows multi-step commands with increased time;Follows one step commands  consistently Safety/Judgement: Decreased awareness of safety;Decreased awareness of deficits Awareness: Emergent Problem Solving: Slow processing;Difficulty sequencing;Requires verbal cues;Requires tactile cues General Comments: Hx of dementia, pleasant and talkative, increased time for following directions, safety and awareness (per chart review, delayed processing post-SDH and craniotomy in 2020). Decreased carryover of cues for safety/hand placement from previous session but fair carryover within session.      Exercises General Exercises - Lower Extremity Ankle Circles/Pumps: Left;10 reps;Supine Long Arc Quad: AROM;Left;10 reps;Seated Heel Slides: AROM;Left;10 reps;Supine Hip ABduction/ADduction: AAROM;Right;10 reps;Supine Hip Flexion/Marching: AROM;Left;10 reps;Seated Other Exercises Other Exercises: standing LLE AROM: heel raises x10 reps Other Exercises: supine RLE AAROM: hip extension x10 reps (pt needs AA for technique but limited understanding, will need reinforcement- tending to ABD more than extend despite multimodal cues)    General Comments General comments (skin integrity, edema, etc.): VSS on RA throughout, no dizziness or nausea reported.      Pertinent Vitals/Pain Pain Assessment: No/denies pain Pain Location: R residual limb Pain Intervention(s): Monitored during session;Repositioned (pt denies pain throughout; premedicated with gabapentin (per previous PT notes, pt gets very drowsy when he has oxy))     PT Goals (current goals can now be found in the care plan section) Acute Rehab PT Goals Patient Stated Goal: go home PT Goal Formulation: With patient Time For Goal Achievement: 07/17/21 Progress towards PT goals: Progressing toward goals    Frequency    Min 2X/week      PT Plan Current plan remains appropriate       AM-PAC PT "6 Clicks" Mobility   Outcome Measure  Help needed turning from your back to your side while in a flat bed without using  bedrails?: None Help needed moving from lying on your back to sitting on the side of a flat bed without using bedrails?: A Little Help needed moving to and from a bed to a chair (including a wheelchair)?: A Lot Help needed standing up from a chair using your arms (e.g., wheelchair or bedside chair)?: A Lot Help needed to walk in hospital room?: Total Help needed climbing 3-5 steps with a railing? : Total 6 Click Score: 13    End of Session Equipment Utilized During Treatment: Gait belt Activity Tolerance: Patient tolerated treatment well Patient left: in chair;with call bell/phone within reach;with chair alarm set Nurse Communication: Mobility status;Need for lift equipment;Other (comment) (use Stedy or seated scoot from drop arm chair to transfer back to bed) PT Visit Diagnosis: Other abnormalities of gait and mobility (R26.89);Muscle weakness (generalized) (M62.81);Pain;Difficulty in walking, not elsewhere classified (R26.2);Unsteadiness on feet (R26.81) Pain - Right/Left: Right Pain - part of body: Leg     Time: 6812-7517 PT Time Calculation (min) (ACUTE ONLY): 21 min  Charges:  $Therapeutic Exercise: 8-22 mins                     Johnston Maddocks P., PTA Acute Rehabilitation Services Pager: [phone] Office: [phone]    Angus Palms 07/06/2021, 2:10 PM

## 2021-07-06 NOTE — Progress Notes (Signed)
PROGRESS NOTE    Lonnie [name]  IZT:[id number] DOB: 1951-10-03 DOA: 06/15/2021 PCP: [name], MD   No chief complaint on file.  Brief Narrative:  70 yo with hx CAD s/p CABG x4 2014, subdural hematoma s/p craniotomy (2020, with residual balance deficits and delayed processing), ischemic cardiomyopathy, right popliteal bypass with PTFE graft 01/23/2020, recent admission 06/07/21 with occluded R fem pop bypass s/p thrombolysis, mechanical thrombectomy of R femoropoliteal bypass graft, stent x2 of the right femoral bypass graft.  He was readmitted 6/25 with weakness and confusion and found to have bladder rentention and possible UTI (multiple species).  His covid testing was positive and he was treated with steroids and remdesivir.  He developed spontaneous bleeding on 7/6 from prior area of R femoropopliteal bypass and then large volume bleeding with hemorrhagic shock on 7/7.  He was taken to the OR by Dr. Chestine Spore on 7/7 for exploration of R below knee popliteal wound, repair of R popliteal vein, ligation of R popliteal artery, and placement of negative pressure wound vac.  He had right AKA on 7/12 with gangrene of R foot.  Assessment & Plan:   Active Problems:   Acute metabolic encephalopathy   AKI (acute kidney injury) (HCC)   Status post above-knee amputation of right lower extremity (HCC)   Altered mental status   Bacteremia due to Pseudomonas  Sepsis secondary to pseudomonas bacteremia Graft segment sent for culture with moderate pseudomonas 7/12 - sensitive to cefepime UA bland, CXR without acute disease RUQ [place] with mild fatty infiltration of the liver 2/2 blood cultures with pseudomonas - pending sensitivities Follow repeat blood cultures pending Vascular following appreciate recs - planning graft removal right leg on 7/16 ID c/s, appreciate recs Echo with EF 50%, RWMA  Elevated Liver Enzymes  Fatty Liver  Likely shock liver, improving  Hepatocellular pattern of liver  injury Negative acute hepatitis panel Minimize meds which could contribute to liver injury RUQ with mild fatty infiltration of liver  Acute Metabolic Encephalopathy Suspect related to above - significantly improved  Delirium precautions W/u further as indicated  Acute Blood Loss Anemia with Hypovolemic Shock Occluded Right Femoropopliteal Bypass s/p lysis complicated by bleeding with disruption and reocclusion of graft S/p right above knee amputation  S/p exploration of R below knee popliteal wound, repair of R popliteal vein, ligation of right popliteal artery and placement of negative pressure wound vac on 7/7 S/p right AKA 7/12 Wound cx with pseudomonas as above S/p 4 units pRBC (I see 5 units ordered, only 4 documented as being completed, one as "transfusing" - unclear) Continue aspirin, plavix Gabapentin for phantom pain  Hb appropriate bump after transfusion yesterday  Leukocytosis See sepsis  Severe PAD  CAD s/p CABG x4 Aspirin, plavix Not currently on statin, unclear reason, will review (elevated LFT's at this time, follow, consider if improved) Continue metoprolol F/u with cardiology and vascular outpatient   LUTS  BPH S/p foley on admission D/c foley as noted above - follow bladder scans Needs urology f/u outpatient Flomax, bethanecol  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 Celexa 20 mg, seroquel 100 mg BID  Acute Metabolice Encephalopathy Thought related to covid vs urinary retention? Delirium precautions  Systolic  Diastolic HF Appears compensated, follow  COVID 19 Virus infection S/p remdesivir/decadron  DVT prophylaxis: SCD Code Status: dnr Family Communication: none at bedside Disposition:   Status is: Inpatient  Remains inpatient appropriate because:Inpatient level of care appropriate due to severity of illness  Dispo:  Patient From:    Planned Disposition: Skilled Nursing Facility  Medically stable  for discharge:           Consultants:  vascular  Procedures:  LE [place] Summary:  RIGHT:  - There is no evidence of deep vein thrombosis in the lower extremity.     - No cystic structure found in the popliteal fossa.     - Incidental: Extensive fluid surrounding stent at popliteal fossa. Flow  is identified within the stent at the level of the popliteal.   - Ultrasound characteristics of enlarged lymph nodes are noted in the  groin.     LEFT:  - There is no evidence of deep vein thrombosis in the lower extremity.     - No cystic structure found in the popliteal fossa.      Antimicrobials:  Anti-infectives (From admission, onward)    Start     Dose/Rate Route Frequency Ordered Stop   07/04/21 2200  vancomycin (VANCOCIN) IVPB 1000 mg/200 mL premix  Status:  Discontinued        1,000 mg 200 mL/hr over 60 Minutes Intravenous Every 12 hours 07/04/21 0906 07/05/21 0802   07/04/21 1000  vancomycin (VANCOREADY) IVPB 1500 mg/300 mL        1,500 mg 150 mL/hr over 120 Minutes Intravenous  Once 07/04/21 0906 07/04/21 1348   07/04/21 1000  ceFEPIme (MAXIPIME) 2 g in sodium chloride 0.9 % 100 mL IVPB        2 g 200 mL/hr over 30 Minutes Intravenous Every 8 hours 07/04/21 0906     07/02/21 2200  ceFAZolin (ANCEF) IVPB 1 g/50 mL premix  Status:  Discontinued        1 g 100 mL/hr over 30 Minutes Intravenous Every 8 hours 07/02/21 1816 07/04/21 0822   06/17/21 1400  ceFEPIme (MAXIPIME) 2 g in sodium chloride 0.9 % 100 mL IVPB        2 g 200 mL/hr over 30 Minutes Intravenous Every 12 hours 06/17/21 1258 06/19/21 0955   06/17/21 1000  remdesivir 100 mg in sodium chloride 0.9 % 100 mL IVPB       See Hyperspace for full Linked Orders Report.   100 mg 200 mL/hr over 30 Minutes Intravenous Daily 06/16/21 0135 06/18/21 1026   06/16/21 2200  cefTRIAXone (ROCEPHIN) 2 g in sodium chloride 0.9 % 100 mL IVPB  Status:  Discontinued        2 g 200 mL/hr over 30 Minutes Intravenous Every 24 hours  06/15/21 2304 06/17/21 1218   06/16/21 2200  vancomycin (VANCOREADY) IVPB 1000 mg/200 mL  Status:  Discontinued        1,000 mg 200 mL/hr over 60 Minutes Intravenous Every 12 hours 06/16/21 0820 06/17/21 1218   06/16/21 0915  vancomycin (VANCOREADY) IVPB 1750 mg/350 mL        1,750 mg 175 mL/hr over 120 Minutes Intravenous  Once 06/16/21 0820 06/16/21 1114   06/16/21 0230  remdesivir 200 mg in sodium chloride 0.9% 250 mL IVPB       See Hyperspace for full Linked Orders Report.   200 mg 580 mL/hr over 30 Minutes Intravenous Once 06/16/21 0135 06/16/21 0324   06/15/21 2230  linezolid (ZYVOX) IVPB 600 mg  Status:  Discontinued        600 mg 300 mL/hr over 60 Minutes Intravenous Every 12 hours 06/15/21 2228 06/16/21 0811   06/15/21 2115  cefTRIAXone (ROCEPHIN) 2 g in sodium chloride 0.9 % 100  mL IVPB        2 g 200 mL/hr over 30 Minutes Intravenous  Once 06/15/21 2114 06/15/21 2328          Subjective: Sleeping, wakes easily No complaints  Objective: Vitals:   07/06/21 0700 07/06/21 0800 07/06/21 0900 07/06/21 1219  BP: (!) 151/81  (!) 139/126 136/73  Pulse: 73  70 72  Resp: Temp:    98.3 F (36.8 C)  TempSrc:  Oral  Oral  SpO2: 97%  98% 99%  Weight:      Height:        Intake/Output Summary (Last 24 hours) at 07/06/2021 1441 Last data filed at 07/06/2021 1019 Gross per 24 hour  Intake 540.94 ml  Output 1000 ml  Net -459.06 ml   Filed Weights   07/02/21 0415 07/03/21 0300 07/04/21 0345  Weight: 78 kg 73.9 kg 72.1 kg    Examination:  General: No acute distress. Cardiovascular: RRR Lungs: Clear to auscultation bilaterally  Abdomen: Soft, nontender, nondistended  Neurological: Alert and oriented.  Moves all extremities 4 . Cranial nerves II through XII grossly intact. Extremities: R AKA - drssing in place - image reviewed from vascular surgery note    Data Reviewed: I have personally reviewed following labs and imaging studies  CBC: Recent Labs   Lab 07/01/21 0123 07/02/21 0308 07/03/21 0209 07/04/21 0214 07/05/21 0143 07/05/21 1637 07/06/21 0545  WBC 18.7* 15.7* 19.8* 27.8* 26.7*  --  20.7*  NEUTROABS 14.7* 12.1*  --  25.8* 23.1*  --  17.5*  HGB 8.6* 8.6* 7.9* 7.9* 6.7* 9.6* 9.4*  HCT 27.5* 27.6* 25.6* 25.1* 21.4* 29.5* 29.2*  MCV 81.6 81.4 82.1 80.2 81.1  --  82.0  PLT 556* 505* 475* 455* 354  --  334    Basic Metabolic Panel: Recent Labs  Lab 07/02/21 0308 07/03/21 0209 07/04/21 0214 07/05/21 0143 07/06/21 0545  NA 134* 133* 132* 135 136  K 4.5 4.4 3.8 3.7 3.9  CL 101 100 99 104 106  CO2 GLUCOSE 139* 123* 138* 133* 105*  BUN CREATININE 0.80 0.63 0.89 0.90 0.77  CALCIUM 9.3 9.1 8.5* 8.6* 8.3*  MG  --   --  1.9 2.1 2.0  PHOS  --   --  2.9 3.7 2.4*    GFR: Estimated Creatinine Clearance: 90.1 mL/min (by C-G formula based on SCr of 0.77 mg/dL).  Liver Function Tests: Recent Labs  Lab 07/01/21 0123 07/02/21 0308 07/04/21 0214 07/05/21 0143 07/06/21 0545  AST 138* 93* 1,255* 629* 147*  ALT 182* 162* 887* 771* 391*  ALKPHOS 120 111 486* 323* 235*  BILITOT 0.7 0.5 1.6* 1.7* 1.3*  PROT 6.4* 6.8 6.4* 5.8* 5.5*  ALBUMIN 2.3* 2.4* 2.4* 2.0* 1.9*    CBG: Recent Labs  Lab 07/04/21 1721  GLUCAP 128*     Recent Results (from the past 240 hour(s))  Aerobic Culture w Gram Stain (superficial specimen)     Status: None   Collection Time: 07/02/21  4:13 PM   Specimen: PATH Other; Tissue  Result Value Ref Range Status   Specimen Description WOUND RIGHT LEG  Final   Special Requests BYPASS  GRAFT PT ON ANCEF  Final   Gram Stain   Final    MODERATE WBC PRESENT,BOTH PMN AND MONONUCLEAR NO ORGANISMS SEEN Performed at [facility], [address], [place], [place] [address]    Culture  MODERATE PSEUDOMONAS AERUGINOSA  Final   Report Status 07/05/2021 FINAL  Final   Organism ID, Bacteria PSEUDOMONAS AERUGINOSA  Final      Susceptibility   Pseudomonas aeruginosa -  MIC*    CEFTAZIDIME 2 SENSITIVE Sensitive     CIPROFLOXACIN <=0.25 SENSITIVE Sensitive     GENTAMICIN <=1 SENSITIVE Sensitive     IMIPENEM 1 SENSITIVE Sensitive     PIP/TAZO <=4 SENSITIVE Sensitive     CEFEPIME 2 SENSITIVE Sensitive     * MODERATE PSEUDOMONAS AERUGINOSA  Culture, blood (routine x 2)     Status: Abnormal (Preliminary result)   Collection Time: 07/04/21  8:49 AM   Specimen: BLOOD LEFT ARM  Result Value Ref Range Status   Specimen Description BLOOD LEFT ARM  Final   Special Requests   Final    BOTTLES DRAWN AEROBIC ONLY Blood Culture adequate volume   Culture  Setup Time   Final    GRAM NEGATIVE RODS AEROBIC BOTTLE ONLY CRITICAL VALUE NOTED.  VALUE IS CONSISTENT WITH PREVIOUSLY REPORTED AND CALLED VALUE.    Culture (Amaani Guilbault)  Final    PSEUDOMONAS AERUGINOSA CULTURE REINCUBATED FOR BETTER GROWTH Performed at [facility], [address], [place], [place] [address]    Report Status PENDING  Incomplete  Culture, blood (routine x 2)     Status: Abnormal (Preliminary result)   Collection Time: 07/04/21  8:53 AM   Specimen: BLOOD RIGHT HAND  Result Value Ref Range Status   Specimen Description BLOOD RIGHT HAND  Final   Special Requests   Final    BOTTLES DRAWN AEROBIC ONLY Blood Culture results may not be optimal due to an inadequate volume of blood received in culture bottles   Culture  Setup Time   Final    GRAM NEGATIVE RODS AEROBIC BOTTLE ONLY CRITICAL RESULT CALLED TO, READ BACK BY AND VERIFIED WITH: J. LEDFORD,PHARMD [id number] 07/05/2021 T. TYSOR    Culture (Sharif Rendell)  Final    PSEUDOMONAS AERUGINOSA CULTURE REINCUBATED FOR BETTER GROWTH Performed at [facility], [address], [place], [place] [address]    Report Status PENDING  Incomplete  Blood Culture ID Panel (Reflexed)     Status: Abnormal   Collection Time: 07/04/21  8:53 AM  Result Value Ref Range Status   Enterococcus faecalis NOT DETECTED NOT DETECTED Final   Enterococcus Faecium NOT DETECTED NOT  DETECTED Final   Listeria monocytogenes NOT DETECTED NOT DETECTED Final   Staphylococcus species NOT DETECTED NOT DETECTED Final   Staphylococcus aureus (BCID) NOT DETECTED NOT DETECTED Final   Staphylococcus epidermidis NOT DETECTED NOT DETECTED Final   Staphylococcus lugdunensis NOT DETECTED NOT DETECTED Final   Streptococcus species NOT DETECTED NOT DETECTED Final   Streptococcus agalactiae NOT DETECTED NOT DETECTED Final   Streptococcus pneumoniae NOT DETECTED NOT DETECTED Final   Streptococcus pyogenes NOT DETECTED NOT DETECTED Final   Yasenia Reedy.calcoaceticus-baumannii NOT DETECTED NOT DETECTED Final   Bacteroides fragilis NOT DETECTED NOT DETECTED Final   Enterobacterales NOT DETECTED NOT DETECTED Final   Enterobacter cloacae complex NOT DETECTED NOT DETECTED Final   Escherichia coli NOT DETECTED NOT DETECTED Final   Klebsiella aerogenes NOT DETECTED NOT DETECTED Final   Klebsiella oxytoca NOT DETECTED NOT DETECTED Final   Klebsiella pneumoniae NOT DETECTED NOT DETECTED Final   Proteus species NOT DETECTED NOT DETECTED Final   Salmonella species NOT DETECTED NOT DETECTED Final   Serratia marcescens NOT DETECTED NOT DETECTED Final   Haemophilus influenzae NOT DETECTED NOT DETECTED  Final   Neisseria meningitidis NOT DETECTED NOT DETECTED Final   Pseudomonas aeruginosa DETECTED (Sailor Haughn) NOT DETECTED Final    Comment: CRITICAL RESULT CALLED TO, READ BACK BY AND VERIFIED WITH: J. LEDFORD,PHARMD [id number] 07/05/2021 T. TYSOR    Stenotrophomonas maltophilia NOT DETECTED NOT DETECTED Final   Candida albicans NOT DETECTED NOT DETECTED Final   Candida auris NOT DETECTED NOT DETECTED Final   Candida glabrata NOT DETECTED NOT DETECTED Final   Candida krusei NOT DETECTED NOT DETECTED Final   Candida parapsilosis NOT DETECTED NOT DETECTED Final   Candida tropicalis NOT DETECTED NOT DETECTED Final   Cryptococcus neoformans/gattii NOT DETECTED NOT DETECTED Final   CTX-M ESBL NOT DETECTED NOT DETECTED Final    Carbapenem resistance IMP NOT DETECTED NOT DETECTED Final   Carbapenem resistance KPC NOT DETECTED NOT DETECTED Final   Carbapenem resistance NDM NOT DETECTED NOT DETECTED Final   Carbapenem resistance VIM NOT DETECTED NOT DETECTED Final    Comment: Performed at [facility], [address], [place], [place] [address]  Urine Culture     Status: None   Collection Time: 07/04/21 11:25 PM   Specimen: Urine, Catheterized  Result Value Ref Range Status   Specimen Description URINE, CATHETERIZED  Final   Special Requests NONE  Final   Culture   Final    NO GROWTH Performed at [facility], [address] [place], [place] [address]    Report Status 07/06/2021 FINAL  Final         Radiology Studies: US Abdomen Limited RUQ (LIVER/GB)  Result Date: 07/05/2021 CLINICAL DATA:  History of hepatitis EXAM: ULTRASOUND ABDOMEN LIMITED RIGHT UPPER QUADRANT COMPARISON:  06/18/2021 FINDINGS: Gallbladder: No gallstones or wall thickening visualized. No sonographic Murphy sign noted by sonographer. Common bile duct: Diameter: 3.4 mm Liver: Mild increased echogenicity is noted similar to that seen on the prior exam likely representing fatty infiltration. No focal mass is noted. Portal vein is patent on color Doppler imaging with normal direction of blood flow towards the liver. Other: None. IMPRESSION: Mild fatty infiltration of the liver. No other focal abnormality is noted. Electronically Signed   By: Alcide Clever M.D.   On: 07/05/2021 01:56        Scheduled Meds:  sodium chloride   Intravenous Once   sodium chloride   Intravenous Once   aspirin EC  81 mg Oral Daily   bethanechol  10 mg Oral TID   Chlorhexidine Gluconate Cloth  6 each Topical Daily   citalopram  20 mg Oral Daily   clopidogrel  75 mg Oral Daily   diphenhydrAMINE  25 mg Oral Once   ferrous fumarate-b12-vitamic C-folic acid  1 capsule Oral Daily   gabapentin  100 mg Oral Daily   gabapentin  300 mg Oral QHS   melatonin   10 mg Oral QHS   metoprolol tartrate  12.5 mg Oral BID   multivitamin with minerals  1 tablet Oral Daily   nicotine  7 mg Transdermal Daily   pantoprazole  40 mg Oral QPM   QUEtiapine  100 mg Oral BID   senna-docusate  1 tablet Oral BID   sodium chloride flush  10-40 mL Intracatheter Q12H   tamsulosin  0.8 mg Oral QPM   Continuous Infusions:  ceFEPime (MAXIPIME) IV 2 g (07/06/21 0527)   lactated ringers       LOS: 20 days    Time spent: over 30 min    Lacretia Nicks, MD [facility]  To contact the attending provider between 7A-7P or the covering provider during after hours 7P-7A, please log into the web site [email or web address] and access using universal [place] password for that web site. If you do not have the password, please call the hospital operator.  07/06/2021, 2:41 PM

## 2021-07-06 NOTE — Progress Notes (Addendum)
  Progress Note    07/06/2021 9:21 AM 4 Days Post-Op  Subjective:  feels good today.  Tells me he virtually has no pain.  Alert to place and year.   Afebrile HR 50's-70's  110's-150's sytolic 97% RA  Vitals:   07/05/21 1950 07/06/21 0400  BP: (!) 149/69 (!) 150/69  Pulse: 70   Resp: 15   Temp: 98.9 F (37.2 C) 98.8 F (37.1 C)  SpO2: 100%     Physical Exam: General:  resting comfortably in no distress Lungs:  non labored Incisions:  right groin incision is clean and dry; right stump looks good with staples in tact.     CBC    Component Value Date/Time   WBC 20.7 (H) 07/06/2021 0545   RBC 3.56 (L) 07/06/2021 0545   HGB 9.4 (L) 07/06/2021 0545   HGB 15.7 01/10/2020 1146   HCT 29.2 (L) 07/06/2021 0545   HCT 46.1 01/10/2020 1146   PLT 334 07/06/2021 0545   PLT 232 01/10/2020 1146   MCV 82.0 07/06/2021 0545   MCV 83 01/10/2020 1146   MCH 26.4 07/06/2021 0545   MCHC 32.2 07/06/2021 0545   RDW 20.2 (H) 07/06/2021 0545   RDW 12.8 01/10/2020 1146   LYMPHSABS 1.5 07/06/2021 0545   LYMPHSABS 2.5 03/01/2015 1414   MONOABS 1.3 (H) 07/06/2021 0545   EOSABS 0.2 07/06/2021 0545   EOSABS 0.4 03/01/2015 1414   BASOSABS 0.1 07/06/2021 0545   BASOSABS 0.1 03/01/2015 1414    BMET    Component Value Date/Time   NA 136 07/06/2021 0545   NA 142 01/10/2020 1146   K 3.9 07/06/2021 0545   CL 106 07/06/2021 0545   CO2 24 07/06/2021 0545   GLUCOSE 105 (H) 07/06/2021 0545   BUN 15 07/06/2021 0545   BUN 19 01/10/2020 1146   CREATININE 0.77 07/06/2021 0545   CREATININE 0.94 05/24/2021 1549   CALCIUM 8.3 (L) 07/06/2021 0545   GFRNONAA >60 07/06/2021 0545   GFRAA >60 09/12/2020 0357    INR    Component Value Date/Time   INR 1.3 (H) 07/04/2021 0915     Intake/Output Summary (Last 24 hours) at 07/06/2021 0921 Last data filed at 07/06/2021 0448 Gross per 24 hour  Intake 975.94 ml  Output 800 ml  Net 175.94 ml     Assessment/Plan:  70 y.o. male is s/p:  Right AKA   4 Days Post-Op   -pt much more awake the past couple of days.   -stump and right groin incision continue to look good.   Leukocytosis improved to 20.7k from 26.7k and is afebrile. -plan to remove right leg bypass graft on Monday.  Dr. Chestine Spore to discuss with pt.   Appreciate ID input. -DVT prophylaxis-per primary team   Doreatha Massed, PA-C [facility] [phone] 07/06/2021 9:21 AM  I have seen and evaluated the patient. I agree with the PA note as documented above.  Right AKA looks good.  He does have Pseudomonas growing in his blood cultures which is the same bacteria cultured from wound in OR at time of AKA.  Has been evaluated by ID and we agree that removing the remainder of his graft will probably be the best option for reducing his chance of bacteremia.  He is tentatively on the schedule for Monday with Dr. Darrick Penna for graft removal right leg.  Cephus Shelling, MD [facility] [place] Office: [phone]

## 2021-07-07 DIAGNOSIS — R4182 Altered mental status, unspecified: Secondary | ICD-10-CM | POA: Diagnosis not present

## 2021-07-07 DIAGNOSIS — B965 Pseudomonas (aeruginosa) (mallei) (pseudomallei) as the cause of diseases classified elsewhere: Secondary | ICD-10-CM | POA: Diagnosis not present

## 2021-07-07 DIAGNOSIS — R7881 Bacteremia: Secondary | ICD-10-CM | POA: Diagnosis not present

## 2021-07-07 LAB — COMPREHENSIVE METABOLIC PANEL
ALT: 285 U/L — ABNORMAL HIGH (ref 0–44)
AST: 90 U/L — ABNORMAL HIGH (ref 15–41)
Albumin: 1.9 g/dL — ABNORMAL LOW (ref 3.5–5.0)
Alkaline Phosphatase: 189 U/L — ABNORMAL HIGH (ref 38–126)
Anion gap: 5 (ref 5–15)
BUN: 12 mg/dL (ref 8–23)
CO2: 23 mmol/L (ref 22–32)
Calcium: 8 mg/dL — ABNORMAL LOW (ref 8.9–10.3)
Chloride: 108 mmol/L (ref 98–111)
Creatinine, Ser: 0.65 mg/dL (ref 0.61–1.24)
GFR, Estimated: >60 mL/min (ref >60)
Glucose, Bld: 108 mg/dL — ABNORMAL HIGH (ref 70–99)
Potassium: 4.3 mmol/L (ref 3.5–5.1)
Sodium: 136 mmol/L (ref 135–145)
Total Bilirubin: 0.9 mg/dL (ref 0.3–1.2)
Total Protein: 5.5 g/dL — ABNORMAL LOW (ref 6.5–8.1)

## 2021-07-07 LAB — CBC WITH DIFFERENTIAL/PLATELET
Abs Immature Granulocytes: 0.31 10*3/uL — ABNORMAL HIGH (ref 0.00–0.07)
Basophils Absolute: 0.1 10*3/uL (ref 0.0–0.1)
Basophils Relative: 1 %
Eosinophils Absolute: 0.2 10*3/uL (ref 0.0–0.5)
Eosinophils Relative: 1 %
HCT: 28.3 % — ABNORMAL LOW (ref 39.0–52.0)
Hemoglobin: 9 g/dL — ABNORMAL LOW (ref 13.0–17.0)
Immature Granulocytes: 2 %
Lymphocytes Relative: 11 %
Lymphs Abs: 2 10*3/uL (ref 0.7–4.0)
MCH: 26 pg (ref 26.0–34.0)
MCHC: 31.8 g/dL (ref 30.0–36.0)
MCV: 81.8 fL (ref 80.0–100.0)
Monocytes Absolute: 1.3 10*3/uL — ABNORMAL HIGH (ref 0.1–1.0)
Monocytes Relative: 7 %
Neutro Abs: 14.9 10*3/uL — ABNORMAL HIGH (ref 1.7–7.7)
Neutrophils Relative %: 78 %
Platelets: 360 10*3/uL (ref 150–400)
RBC: 3.46 MIL/uL — ABNORMAL LOW (ref 4.22–5.81)
RDW: 20.6 % — ABNORMAL HIGH (ref 11.5–15.5)
WBC: 18.8 10*3/uL — ABNORMAL HIGH (ref 4.0–10.5)
nRBC: 0 % (ref 0.0–0.2)

## 2021-07-07 LAB — PHOSPHORUS: Phosphorus: 2.4 mg/dL — ABNORMAL LOW (ref 2.5–4.6)

## 2021-07-07 LAB — MAGNESIUM: Magnesium: 2 mg/dL (ref 1.7–2.4)

## 2021-07-07 NOTE — Progress Notes (Signed)
PROGRESS NOTE    Lonnie [name]  HDQ:[id number] DOB: 1951/05/11 DOA: 06/15/2021 PCP: [name], MD   No chief complaint on file.  Brief Narrative:  70 yo with hx CAD s/p CABG x4 2014, subdural hematoma s/p craniotomy (2020, with residual balance deficits and delayed processing), ischemic cardiomyopathy, right popliteal bypass with PTFE graft 01/23/2020, recent admission 06/07/21 with occluded R fem pop bypass s/p thrombolysis, mechanical thrombectomy of R femoropoliteal bypass graft, stent x2 of the right femoral bypass graft.  He was readmitted 6/25 with weakness and confusion and found to have bladder rentention and possible UTI (multiple species).  His covid testing was positive and he was treated with steroids and remdesivir.  He developed spontaneous bleeding on 7/6 from prior area of R femoropopliteal bypass and then large volume bleeding with hemorrhagic shock on 7/7.  He was taken to the OR by Dr. Chestine Spore on 7/7 for exploration of R below knee popliteal wound, repair of R popliteal vein, ligation of R popliteal artery, and placement of negative pressure wound vac.  He had right AKA on 7/12 with gangrene of R foot.  Assessment & Plan:   Active Problems:   Acute metabolic encephalopathy   AKI (acute kidney injury) (HCC)   Status post above-knee amputation of right lower extremity (HCC)   Altered mental status   Bacteremia due to Pseudomonas  Sepsis secondary to pseudomonas bacteremia Graft segment sent for culture with moderate pseudomonas 7/12 - sensitive to cefepime UA bland, CXR without acute disease RUQ [place] with mild fatty infiltration of the liver 2/2 blood cultures with pseudomonas - pending sensitivities Follow repeat blood cultures NG <24 Vascular following appreciate recs - planning graft removal right leg on 7/16 ID c/s, appreciate recs - recommending removal of graft, appreciate additional recs Echo with EF 50%, RWMA  Elevated Liver Enzymes  Fatty Liver  Likely shock  liver, improving  Hepatocellular pattern of liver injury -> improving Negative acute hepatitis panel Minimize meds which could contribute to liver injury RUQ with mild fatty infiltration of liver  Acute Metabolic Encephalopathy Suspect related to above - significantly improved  Delirium precautions W/u further as indicated  Acute Blood Loss Anemia with Hypovolemic Shock Occluded Right Femoropopliteal Bypass s/p lysis complicated by bleeding with disruption and reocclusion of graft S/p right above knee amputation  S/p exploration of R below knee popliteal wound, repair of R popliteal vein, ligation of right popliteal artery and placement of negative pressure wound vac on 7/7 S/p right AKA 7/12 Wound cx with pseudomonas as above - treatment as noted above S/p 4 units pRBC (I see 5 units ordered, only 4 documented as being completed, one as "transfusing" - unclear) Continue aspirin, plavix Gabapentin for phantom pain  Hb relatively stable  Leukocytosis See sepsis  Severe PAD  CAD s/p CABG x4 Aspirin, plavix Not currently on statin, unclear reason, will review (elevated LFT's at this time, follow, consider if improved) Continue metoprolol F/u with cardiology and vascular outpatient   LUTS  BPH S/p foley on admission D/c foley as noted above - follow bladder scans Needs urology f/u outpatient Flomax, bethanecol  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 Celexa 20 mg, seroquel 100 mg BID  Systolic  Diastolic HF Appears compensated, follow  COVID 19 Virus infection S/p remdesivir/decadron  DVT prophylaxis: SCD Code Status: dnr Family Communication: daughter, felicia Disposition:   Status is: Inpatient  Remains inpatient appropriate because:Inpatient level of care appropriate due to severity of illness  Dispo:  Patient From:    Planned Disposition: Skilled Nursing Facility  Medically stable for discharge:            Consultants:  vascular  Procedures:  LE [place] Summary:  RIGHT:  - There is no evidence of deep vein thrombosis in the lower extremity.     - No cystic structure found in the popliteal fossa.     - Incidental: Extensive fluid surrounding stent at popliteal fossa. Flow  is identified within the stent at the level of the popliteal.   - Ultrasound characteristics of enlarged lymph nodes are noted in the  groin.     LEFT:  - There is no evidence of deep vein thrombosis in the lower extremity.     - No cystic structure found in the popliteal fossa.      Antimicrobials:  Anti-infectives (From admission, onward)    Start     Dose/Rate Route Frequency Ordered Stop   07/04/21 2200  vancomycin (VANCOCIN) IVPB 1000 mg/200 mL premix  Status:  Discontinued        1,000 mg 200 mL/hr over 60 Minutes Intravenous Every 12 hours 07/04/21 0906 07/05/21 0802   07/04/21 1000  vancomycin (VANCOREADY) IVPB 1500 mg/300 mL        1,500 mg 150 mL/hr over 120 Minutes Intravenous  Once 07/04/21 0906 07/04/21 1348   07/04/21 1000  ceFEPIme (MAXIPIME) 2 g in sodium chloride 0.9 % 100 mL IVPB        2 g 200 mL/hr over 30 Minutes Intravenous Every 8 hours 07/04/21 0906     07/02/21 2200  ceFAZolin (ANCEF) IVPB 1 g/50 mL premix  Status:  Discontinued        1 g 100 mL/hr over 30 Minutes Intravenous Every 8 hours 07/02/21 1816 07/04/21 0822   06/17/21 1400  ceFEPIme (MAXIPIME) 2 g in sodium chloride 0.9 % 100 mL IVPB        2 g 200 mL/hr over 30 Minutes Intravenous Every 12 hours 06/17/21 1258 06/19/21 0955   06/17/21 1000  remdesivir 100 mg in sodium chloride 0.9 % 100 mL IVPB       See Hyperspace for full Linked Orders Report.   100 mg 200 mL/hr over 30 Minutes Intravenous Daily 06/16/21 0135 06/18/21 1026   06/16/21 2200  cefTRIAXone (ROCEPHIN) 2 g in sodium chloride 0.9 % 100 mL IVPB  Status:  Discontinued        2 g 200 mL/hr over 30 Minutes Intravenous Every 24 hours 06/15/21 2304 06/17/21 1218    06/16/21 2200  vancomycin (VANCOREADY) IVPB 1000 mg/200 mL  Status:  Discontinued        1,000 mg 200 mL/hr over 60 Minutes Intravenous Every 12 hours 06/16/21 0820 06/17/21 1218   06/16/21 0915  vancomycin (VANCOREADY) IVPB 1750 mg/350 mL        1,750 mg 175 mL/hr over 120 Minutes Intravenous  Once 06/16/21 0820 06/16/21 1114   06/16/21 0230  remdesivir 200 mg in sodium chloride 0.9% 250 mL IVPB       See Hyperspace for full Linked Orders Report.   200 mg 580 mL/hr over 30 Minutes Intravenous Once 06/16/21 0135 06/16/21 0324   06/15/21 2230  linezolid (ZYVOX) IVPB 600 mg  Status:  Discontinued        600 mg 300 mL/hr over 60 Minutes Intravenous Every 12 hours 06/15/21 2228 06/16/21 0811   06/15/21 2115  cefTRIAXone (ROCEPHIN) 2 g in sodium chloride 0.9 % 100 mL IVPB  2 g 200 mL/hr over 30 Minutes Intravenous  Once 06/15/21 2114 06/15/21 2328          Subjective: No new complaints  Objective: Vitals:   07/07/21 0516 07/07/21 0526 07/07/21 0815 07/07/21 1130  BP: (!) 144/80  (!) 148/73 (!) 119/91  Pulse: 65 63 71 68  Resp: 17 15 12 15   Temp: 98.4 F (36.9 C)  98.7 F (37.1 C) 99.4 F (37.4 C)  TempSrc: Oral  Oral Oral  SpO2: 100% 98% 98% 99%  Weight:  74.6 kg    Height:        Intake/Output Summary (Last 24 hours) at 07/07/2021 1400 Last data filed at 07/07/2021 1130 Gross per 24 hour  Intake 675.73 ml  Output 2100 ml  Net -1424.27 ml   Filed Weights   07/03/21 0300 07/04/21 0345 07/07/21 0526  Weight: 73.9 kg 72.1 kg 74.6 kg    Examination:  General: No acute distress. Cardiovascular: RRR Lungs: unlabored Abdomen: Soft, nontender, nondistended  Neurological: Alert. Moves all extremities 4. Cranial nerves II through XII grossly intact. Skin: Warm and dry. No rashes or lesions. Extremities: R AKA, wound appears to be healing well     Data Reviewed: I have personally reviewed following labs and imaging studies  CBC: Recent Labs  Lab  07/02/21 0308 07/03/21 0209 07/04/21 0214 07/05/21 0143 07/05/21 1637 07/06/21 0545 07/07/21 0056  WBC 15.7* 19.8* 27.8* 26.7*  --  20.7* 18.8*  NEUTROABS 12.1*  --  25.8* 23.1*  --  17.5* 14.9*  HGB 8.6* 7.9* 7.9* 6.7* 9.6* 9.4* 9.0*  HCT 27.6* 25.6* 25.1* 21.4* 29.5* 29.2* 28.3*  MCV 81.4 82.1 80.2 81.1  --  82.0 81.8  PLT 505* 475* 455* 354  --  334 360    Basic Metabolic Panel: Recent Labs  Lab 07/03/21 0209 07/04/21 0214 07/05/21 0143 07/06/21 0545 07/07/21 0056  NA 133* 132* 135 136 136  K 4.4 3.8 3.7 3.9 4.3  CL 100 99 104 106 108  CO2 23 23 25 24 23   GLUCOSE 123* 138* 133* 105* 108*  BUN 11 14 13 15 12   CREATININE 0.63 0.89 0.90 0.77 0.65  CALCIUM 9.1 8.5* 8.6* 8.3* 8.0*  MG  --  1.9 2.1 2.0 2.0  PHOS  --  2.9 3.7 2.4* 2.4*    GFR: Estimated Creatinine Clearance: 93.3 mL/min (by C-G formula based on SCr of 0.65 mg/dL).  Liver Function Tests: Recent Labs  Lab 07/02/21 0308 07/04/21 0214 07/05/21 0143 07/06/21 0545 07/07/21 0056  AST 93* 1,255* 629* 147* 90*  ALT 162* 887* 771* 391* 285*  ALKPHOS 111 486* 323* 235* 189*  BILITOT 0.5 1.6* 1.7* 1.3* 0.9  PROT 6.8 6.4* 5.8* 5.5* 5.5*  ALBUMIN 2.4* 2.4* 2.0* 1.9* 1.9*    CBG: Recent Labs  Lab 07/04/21 1721  GLUCAP 128*     Recent Results (from the past 240 hour(s))  Aerobic Culture w Gram Stain (superficial specimen)     Status: None   Collection Time: 07/02/21  4:13 PM   Specimen: PATH Other; Tissue  Result Value Ref Range Status   Specimen Description WOUND RIGHT LEG  Final   Special Requests BYPASS  GRAFT PT ON ANCEF  Final   Gram Stain   Final    MODERATE WBC PRESENT,BOTH PMN AND MONONUCLEAR NO ORGANISMS SEEN Performed at [facility], [address] [place], [place] [id number]    Culture MODERATE PSEUDOMONAS AERUGINOSA  Final   Report Status 07/05/2021 FINAL  Final   Organism ID, Bacteria PSEUDOMONAS AERUGINOSA  Final      Susceptibility   Pseudomonas aeruginosa - MIC*     CEFTAZIDIME 2 SENSITIVE Sensitive     CIPROFLOXACIN <=0.25 SENSITIVE Sensitive     GENTAMICIN <=1 SENSITIVE Sensitive     IMIPENEM 1 SENSITIVE Sensitive     PIP/TAZO <=4 SENSITIVE Sensitive     CEFEPIME 2 SENSITIVE Sensitive     * MODERATE PSEUDOMONAS AERUGINOSA  Culture, blood (routine x 2)     Status: Abnormal (Preliminary result)   Collection Time: 07/04/21  8:49 AM   Specimen: BLOOD LEFT ARM  Result Value Ref Range Status   Specimen Description BLOOD LEFT ARM  Final   Special Requests   Final    BOTTLES DRAWN AEROBIC ONLY Blood Culture adequate volume   Culture  Setup Time   Final    GRAM NEGATIVE RODS AEROBIC BOTTLE ONLY CRITICAL VALUE NOTED.  VALUE IS CONSISTENT WITH PREVIOUSLY REPORTED AND CALLED VALUE. Performed at [facility], [address] [place], [place] [address]    Culture PSEUDOMONAS AERUGINOSA (Kobi Mario)  Final   Report Status PENDING  Incomplete  Culture, blood (routine x 2)     Status: Abnormal (Preliminary result)   Collection Time: 07/04/21  8:53 AM   Specimen: BLOOD RIGHT HAND  Result Value Ref Range Status   Specimen Description BLOOD RIGHT HAND  Final   Special Requests   Final    BOTTLES DRAWN AEROBIC ONLY Blood Culture results may not be optimal due to an inadequate volume of blood received in culture bottles   Culture  Setup Time   Final    GRAM NEGATIVE RODS AEROBIC BOTTLE ONLY CRITICAL RESULT CALLED TO, READ BACK BY AND VERIFIED WITH: J. LEDFORD,PHARMD [id number] 07/05/2021 T. TYSOR    Culture (Dorothey Oetken)  Final    PSEUDOMONAS AERUGINOSA SUSCEPTIBILITIES TO FOLLOW Performed at [facility], [address] [place], [place] [address]    Report Status PENDING  Incomplete  Blood Culture ID Panel (Reflexed)     Status: Abnormal   Collection Time: 07/04/21  8:53 AM  Result Value Ref Range Status   Enterococcus faecalis NOT DETECTED NOT DETECTED Final   Enterococcus Faecium NOT DETECTED NOT DETECTED Final   Listeria monocytogenes NOT DETECTED NOT DETECTED  Final   Staphylococcus species NOT DETECTED NOT DETECTED Final   Staphylococcus aureus (BCID) NOT DETECTED NOT DETECTED Final   Staphylococcus epidermidis NOT DETECTED NOT DETECTED Final   Staphylococcus lugdunensis NOT DETECTED NOT DETECTED Final   Streptococcus species NOT DETECTED NOT DETECTED Final   Streptococcus agalactiae NOT DETECTED NOT DETECTED Final   Streptococcus pneumoniae NOT DETECTED NOT DETECTED Final   Streptococcus pyogenes NOT DETECTED NOT DETECTED Final   Nasirah Sachs.calcoaceticus-baumannii NOT DETECTED NOT DETECTED Final   Bacteroides fragilis NOT DETECTED NOT DETECTED Final   Enterobacterales NOT DETECTED NOT DETECTED Final   Enterobacter cloacae complex NOT DETECTED NOT DETECTED Final   Escherichia coli NOT DETECTED NOT DETECTED Final   Klebsiella aerogenes NOT DETECTED NOT DETECTED Final   Klebsiella oxytoca NOT DETECTED NOT DETECTED Final   Klebsiella pneumoniae NOT DETECTED NOT DETECTED Final   Proteus species NOT DETECTED NOT DETECTED Final   Salmonella species NOT DETECTED NOT DETECTED Final   Serratia marcescens NOT DETECTED NOT DETECTED Final   Haemophilus influenzae NOT DETECTED NOT DETECTED Final   Neisseria meningitidis NOT DETECTED NOT DETECTED Final   Pseudomonas aeruginosa DETECTED (Shloma Roggenkamp) NOT DETECTED Final    Comment:  CRITICAL RESULT CALLED TO, READ BACK BY AND VERIFIED WITH: J. LEDFORD,PHARMD [id number] 07/05/2021 T. TYSOR    Stenotrophomonas maltophilia NOT DETECTED NOT DETECTED Final   Candida albicans NOT DETECTED NOT DETECTED Final   Candida auris NOT DETECTED NOT DETECTED Final   Candida glabrata NOT DETECTED NOT DETECTED Final   Candida krusei NOT DETECTED NOT DETECTED Final   Candida parapsilosis NOT DETECTED NOT DETECTED Final   Candida tropicalis NOT DETECTED NOT DETECTED Final   Cryptococcus neoformans/gattii NOT DETECTED NOT DETECTED Final   CTX-M ESBL NOT DETECTED NOT DETECTED Final   Carbapenem resistance IMP NOT DETECTED NOT DETECTED Final    Carbapenem resistance KPC NOT DETECTED NOT DETECTED Final   Carbapenem resistance NDM NOT DETECTED NOT DETECTED Final   Carbapenem resistance VIM NOT DETECTED NOT DETECTED Final    Comment: Performed at [facility], [address] [place], [place] [address]  Urine Culture     Status: None   Collection Time: 07/04/21 11:25 PM   Specimen: Urine, Catheterized  Result Value Ref Range Status   Specimen Description URINE, CATHETERIZED  Final   Special Requests NONE  Final   Culture   Final    NO GROWTH Performed at [facility], [address], [place], [place] [address]    Report Status 07/06/2021 FINAL  Final  Culture, blood (routine x 2)     Status: None (Preliminary result)   Collection Time: 07/05/21  4:37 PM   Specimen: BLOOD LEFT HAND  Result Value Ref Range Status   Specimen Description BLOOD LEFT HAND  Final   Special Requests   Final    BOTTLES DRAWN AEROBIC ONLY Blood Culture adequate volume   Culture   Final    NO GROWTH < 24 HOURS Performed at [facility], [address] [place], [place] [address]    Report Status PENDING  Incomplete  Culture, blood (routine x 2)     Status: None (Preliminary result)   Collection Time: 07/05/21  4:37 PM   Specimen: BLOOD  Result Value Ref Range Status   Specimen Description BLOOD LEFT ANTECUBITAL  Final   Special Requests   Final    BOTTLES DRAWN AEROBIC ONLY Blood Culture results may not be optimal due to an inadequate volume of blood received in culture bottles   Culture   Final    NO GROWTH < 24 HOURS Performed at [facility], [address] [place], [place] [address]    Report Status PENDING  Incomplete         Radiology Studies: No results found.      Scheduled Meds:  sodium chloride   Intravenous Once   sodium chloride   Intravenous Once   aspirin EC  81 mg Oral Daily   bethanechol  10 mg Oral TID   Chlorhexidine Gluconate Cloth  6 each Topical Daily   citalopram  20 mg Oral Daily    clopidogrel  75 mg Oral Daily   diphenhydrAMINE  25 mg Oral Once   ferrous fumarate-b12-vitamic C-folic acid  1 capsule Oral Daily   gabapentin  100 mg Oral Daily   gabapentin  300 mg Oral QHS   melatonin  10 mg Oral QHS   metoprolol tartrate  12.5 mg Oral BID   multivitamin with minerals  1 tablet Oral Daily   nicotine  7 mg Transdermal Daily   pantoprazole  40 mg Oral QPM   QUEtiapine  100 mg Oral BID  senna-docusate  1 tablet Oral BID   sodium chloride flush  10-40 mL Intracatheter Q12H   tamsulosin  0.8 mg Oral QPM   Continuous Infusions:  ceFEPime (MAXIPIME) IV 2 g (07/07/21 0534)   lactated ringers       LOS: 21 days    Time spent: over 30 min    Lacretia Nicks, MD [facility]   To contact the attending provider between 7A-7P or the covering provider during after hours 7P-7A, please log into the web site [email or web address] and access using universal [place] password for that web site. If you do not have the password, please call the hospital operator.  07/07/2021, 2:00 PM

## 2021-07-07 NOTE — Progress Notes (Signed)
Pharmacy Antibiotic Note  Lonnie [name] is a 70 y.o. male admitted on 06/15/2021 with sepsis.  Pharmacy has been consulted for vancomycin and cefepime dosing. S/p right AKA 7/12 with gangrene of R foot complicated by leukocytosis and fevers after surgery.   6/25 Linezolid >>6/26 6/25 CTX >>6/27 6/26 Remdesivir >>6/28 7/1 cefazolin>> 7/14 6/26-6/27 Vancomycin>>restart 7/14>>7/15 6/27-6/29 cefepime>> restart 7/14>current   7/12 wound: pseudomonas 7/14 urine>>NGTD 7/14 Blood: 2/2 pseudomonas 7/15 Blood: pending  Patient is currently afebrile (Tmax 99.26F), WBC trending down. Planned graft removal for 7/18 given that pseudomonas can be difficult to treat given the affinity to form biofilms. Also concern for recurrence of bacteremia. ID recommends treating with IV antibiotics for 6 weeks.   Plan: Continue cefepime 2 g Q 8h. Graft removal scheduled for 7/18  F/up CBC, s/sx infection, renal function, cultures, plans for OPAT   Height: 6\' 2"  (188 cm) Weight: 74.6 kg (164 lb 7.4 oz) IBW/kg (Calculated) : 82.2  Temp (24hrs), Avg:98.5 F (36.9 C), Min:97.9 F (36.6 C), Max:99.1 F (37.3 C)  Recent Labs  Lab 07/03/21 0209 07/04/21 0214 07/05/21 0143 07/06/21 0545 07/07/21 0056  WBC 19.8* 27.8* 26.7* 20.7* 18.8*  CREATININE 0.63 0.89 0.90 0.77 0.65     Estimated Creatinine Clearance: 93.3 mL/min (by C-G formula based on SCr of 0.65 mg/dL).    Allergies  Allergen Reactions   Ace Inhibitors Swelling and Other (See Comments)    Angioedema   Eggs Or Egg-Derived Products Nausea Only and Other (See Comments)    Cannot eat prepared eggs- Occasional nausea   Lactose Intolerance (Gi) Diarrhea, Nausea Only and Other (See Comments)    Flatulence, also   Latex Itching     Thank you for allowing pharmacy to be a part of this patient's care.  07/09/21, Pharm.D. PGY-1 Ambulatory Care Resident [phone] 07/07/2021 11:44 AM

## 2021-07-07 NOTE — Progress Notes (Addendum)
  Progress Note    07/07/2021 9:00 AM 5 Days Post-Op  Subjective:  awake and alert this morning.  Denies any pain.  Understands that he needs graft removed.  Says he hopes this is his last surgery.   Tm 99.1 at 1700 afebrile otherwise  Vitals:   07/07/21 0526 07/07/21 0815  BP:  (!) 148/73  Pulse: 63 71  Resp: 15 12  Temp:  98.7 F (37.1 C)  SpO2: 98% 98%    Physical Exam: Incisions:  right groin is clean without evidence of infection.  Right AKA stump looks good with staples in tact.  There was some serous drainage on the bandage when I removed it.    CBC    Component Value Date/Time   WBC 18.8 (H) 07/07/2021 0056   RBC 3.46 (L) 07/07/2021 0056   HGB 9.0 (L) 07/07/2021 0056   HGB 15.7 01/10/2020 1146   HCT 28.3 (L) 07/07/2021 0056   HCT 46.1 01/10/2020 1146   PLT 360 07/07/2021 0056   PLT 232 01/10/2020 1146   MCV 81.8 07/07/2021 0056   MCV 83 01/10/2020 1146   MCH 26.0 07/07/2021 0056   MCHC 31.8 07/07/2021 0056   RDW 20.6 (H) 07/07/2021 0056   RDW 12.8 01/10/2020 1146   LYMPHSABS 2.0 07/07/2021 0056   LYMPHSABS 2.5 03/01/2015 1414   MONOABS 1.3 (H) 07/07/2021 0056   EOSABS 0.2 07/07/2021 0056   EOSABS 0.4 03/01/2015 1414   BASOSABS 0.1 07/07/2021 0056   BASOSABS 0.1 03/01/2015 1414    BMET    Component Value Date/Time   NA 136 07/07/2021 0056   NA 142 01/10/2020 1146   K 4.3 07/07/2021 0056   CL 108 07/07/2021 0056   CO2 23 07/07/2021 0056   GLUCOSE 108 (H) 07/07/2021 0056   BUN 12 07/07/2021 0056   BUN 19 01/10/2020 1146   CREATININE 0.65 07/07/2021 0056   CREATININE 0.94 05/24/2021 1549   CALCIUM 8.0 (L) 07/07/2021 0056   GFRNONAA >60 07/07/2021 0056   GFRAA >60 09/12/2020 0357    INR    Component Value Date/Time   INR 1.3 (H) 07/04/2021 0915     Intake/Output Summary (Last 24 hours) at 07/07/2021 0900 Last data filed at 07/07/2021 0530 Gross per 24 hour  Intake 675.73 ml  Output 2100 ml  Net -1424.27 ml     Assessment/Plan:  70  y.o. male is s/p right above knee amputation  5 Days Post-Op  -pt doing well this morning.  Incisions look fine.  -given pseudomonas on wound cx as well as blood cx, will remove prosthetic graft from RLE tomorrow.  Discussed this with pt and he understands and in agreement.  -consent/npo after MN discussed with pt and ordered.   Doreatha Massed, PA-C [facility] [phone] 07/07/2021 9:00 AM   I have seen and evaluated the patient. I agree with the PA note as documented above.  Plan OR tomorrow for excision of remaining right thigh prosthetic graft with Dr. Darrick Penna.  Pseudomonas on blood cultures and ID agrees with graft excision given correlates with OR cultures.  NPO after midnight.  Cephus Shelling, MD [facility] [place] Office: [phone]

## 2021-07-07 NOTE — H&P (View-Only) (Signed)
  Progress Note    07/07/2021 9:00 AM 5 Days Post-Op  Subjective:  awake and alert this morning.  Denies any pain.  Understands that he needs graft removed.  Says he hopes this is his last surgery.   Tm 99.1 at 1700 afebrile otherwise  Vitals:   07/07/21 0526 07/07/21 0815  BP:  (!) 148/73  Pulse: 63 71  Resp: 15 12  Temp:  98.7 F (37.1 C)  SpO2: 98% 98%    Physical Exam: Incisions:  right groin is clean without evidence of infection.  Right AKA stump looks good with staples in tact.  There was some serous drainage on the bandage when I removed it.    CBC    Component Value Date/Time   WBC 18.8 (H) 07/07/2021 0056   RBC 3.46 (L) 07/07/2021 0056   HGB 9.0 (L) 07/07/2021 0056   HGB 15.7 01/10/2020 1146   HCT 28.3 (L) 07/07/2021 0056   HCT 46.1 01/10/2020 1146   PLT 360 07/07/2021 0056   PLT 232 01/10/2020 1146   MCV 81.8 07/07/2021 0056   MCV 83 01/10/2020 1146   MCH 26.0 07/07/2021 0056   MCHC 31.8 07/07/2021 0056   RDW 20.6 (H) 07/07/2021 0056   RDW 12.8 01/10/2020 1146   LYMPHSABS 2.0 07/07/2021 0056   LYMPHSABS 2.5 03/01/2015 1414   MONOABS 1.3 (H) 07/07/2021 0056   EOSABS 0.2 07/07/2021 0056   EOSABS 0.4 03/01/2015 1414   BASOSABS 0.1 07/07/2021 0056   BASOSABS 0.1 03/01/2015 1414    BMET    Component Value Date/Time   NA 136 07/07/2021 0056   NA 142 01/10/2020 1146   K 4.3 07/07/2021 0056   CL 108 07/07/2021 0056   CO2 23 07/07/2021 0056   GLUCOSE 108 (H) 07/07/2021 0056   BUN 12 07/07/2021 0056   BUN 19 01/10/2020 1146   CREATININE 0.65 07/07/2021 0056   CREATININE 0.94 05/24/2021 1549   CALCIUM 8.0 (L) 07/07/2021 0056   GFRNONAA >60 07/07/2021 0056   GFRAA >60 09/12/2020 0357    INR    Component Value Date/Time   INR 1.3 (H) 07/04/2021 0915     Intake/Output Summary (Last 24 hours) at 07/07/2021 0900 Last data filed at 07/07/2021 0530 Gross per 24 hour  Intake 675.73 ml  Output 2100 ml  Net -1424.27 ml     Assessment/Plan:  70  y.o. male is s/p right above knee amputation  5 Days Post-Op  -pt doing well this morning.  Incisions look fine.  -given pseudomonas on wound cx as well as blood cx, will remove prosthetic graft from RLE tomorrow.  Discussed this with pt and he understands and in agreement.  -consent/npo after MN discussed with pt and ordered.   Samantha Rhyne, PA-C [facility] [phone] 07/07/2021 9:00 AM   I have seen and evaluated the patient. I agree with the PA note as documented above.  Plan OR tomorrow for excision of remaining right thigh prosthetic graft with Dr. Fields.  Pseudomonas on blood cultures and ID agrees with graft excision given correlates with OR cultures.  NPO after midnight.  Jayesh Marbach J. Lisaann Atha, MD [facility] [place] Office: [phone]         

## 2021-07-08 ENCOUNTER — Encounter (HOSPITAL_COMMUNITY)
Admission: EM | Disposition: A | Discharge status: Skilled Nursing Facility | Payer: Self-pay | Source: Home / Self Care | Attending: Family Medicine

## 2021-07-08 ENCOUNTER — Encounter (HOSPITAL_COMMUNITY): Payer: Self-pay | Admitting: Student

## 2021-07-08 ENCOUNTER — Other Ambulatory Visit: Payer: Self-pay

## 2021-07-08 ENCOUNTER — Inpatient Hospital Stay (HOSPITAL_COMMUNITY): Payer: Medicare Other | Admitting: Certified Registered"

## 2021-07-08 DIAGNOSIS — T827XXA Infection and inflammatory reaction due to other cardiac and vascular devices, implants and grafts, initial encounter: Secondary | ICD-10-CM

## 2021-07-08 DIAGNOSIS — T827XXD Infection and inflammatory reaction due to other cardiac and vascular devices, implants and grafts, subsequent encounter: Secondary | ICD-10-CM | POA: Diagnosis not present

## 2021-07-08 DIAGNOSIS — B965 Pseudomonas (aeruginosa) (mallei) (pseudomallei) as the cause of diseases classified elsewhere: Secondary | ICD-10-CM | POA: Diagnosis not present

## 2021-07-08 DIAGNOSIS — I70245 Atherosclerosis of native arteries of left leg with ulceration of other part of foot: Secondary | ICD-10-CM

## 2021-07-08 DIAGNOSIS — R7881 Bacteremia: Secondary | ICD-10-CM | POA: Diagnosis not present

## 2021-07-08 DIAGNOSIS — Y832 Surgical operation with anastomosis, bypass or graft as the cause of abnormal reaction of the patient, or of later complication, without mention of misadventure at the time of the procedure: Secondary | ICD-10-CM

## 2021-07-08 HISTORY — PX: REMOVAL OF GRAFT: SHX6361

## 2021-07-08 HISTORY — PX: PATCH ANGIOPLASTY: SHX6230

## 2021-07-08 LAB — CBC WITH DIFFERENTIAL/PLATELET
Abs Immature Granulocytes: 0.59 10*3/uL — ABNORMAL HIGH (ref 0.00–0.07)
Basophils Absolute: 0.1 10*3/uL (ref 0.0–0.1)
Basophils Relative: 1 %
Eosinophils Absolute: 0.3 10*3/uL (ref 0.0–0.5)
Eosinophils Relative: 2 %
HCT: 30.3 % — ABNORMAL LOW (ref 39.0–52.0)
Hemoglobin: 9.6 g/dL — ABNORMAL LOW (ref 13.0–17.0)
Immature Granulocytes: 4 %
Lymphocytes Relative: 11 %
Lymphs Abs: 1.8 10*3/uL (ref 0.7–4.0)
MCH: 26.4 pg (ref 26.0–34.0)
MCHC: 31.7 g/dL (ref 30.0–36.0)
MCV: 83.5 fL (ref 80.0–100.0)
Monocytes Absolute: 1.1 10*3/uL — ABNORMAL HIGH (ref 0.1–1.0)
Monocytes Relative: 7 %
Neutro Abs: 11.6 10*3/uL — ABNORMAL HIGH (ref 1.7–7.7)
Neutrophils Relative %: 75 %
Platelets: 453 10*3/uL — ABNORMAL HIGH (ref 150–400)
RBC: 3.63 MIL/uL — ABNORMAL LOW (ref 4.22–5.81)
RDW: 21 % — ABNORMAL HIGH (ref 11.5–15.5)
WBC: 15.4 10*3/uL — ABNORMAL HIGH (ref 4.0–10.5)
nRBC: 0 % (ref 0.0–0.2)

## 2021-07-08 LAB — COMPREHENSIVE METABOLIC PANEL
ALT: 213 U/L — ABNORMAL HIGH (ref 0–44)
AST: 51 U/L — ABNORMAL HIGH (ref 15–41)
Albumin: 2 g/dL — ABNORMAL LOW (ref 3.5–5.0)
Alkaline Phosphatase: 164 U/L — ABNORMAL HIGH (ref 38–126)
Anion gap: 8 (ref 5–15)
BUN: 8 mg/dL (ref 8–23)
CO2: 22 mmol/L (ref 22–32)
Calcium: 8.4 mg/dL — ABNORMAL LOW (ref 8.9–10.3)
Chloride: 106 mmol/L (ref 98–111)
Creatinine, Ser: 0.59 mg/dL — ABNORMAL LOW (ref 0.61–1.24)
GFR, Estimated: >60 mL/min (ref >60)
Glucose, Bld: 113 mg/dL — ABNORMAL HIGH (ref 70–99)
Potassium: 4 mmol/L (ref 3.5–5.1)
Sodium: 136 mmol/L (ref 135–145)
Total Bilirubin: 0.7 mg/dL (ref 0.3–1.2)
Total Protein: 6 g/dL — ABNORMAL LOW (ref 6.5–8.1)

## 2021-07-08 LAB — CULTURE, BLOOD (ROUTINE X 2): Special Requests: ADEQUATE

## 2021-07-08 LAB — PHOSPHORUS: Phosphorus: 2.9 mg/dL (ref 2.5–4.6)

## 2021-07-08 LAB — MAGNESIUM: Magnesium: 1.9 mg/dL (ref 1.7–2.4)

## 2021-07-08 SURGERY — REMOVAL, GRAFT
Anesthesia: General | Site: Groin | Laterality: Right

## 2021-07-08 MED ORDER — HEPARIN SODIUM (PORCINE) 1000 UNIT/ML IJ SOLN
INTRAMUSCULAR | Status: DC | PRN
  Administered 2021-07-08 (×2): 8000 [IU] via INTRAVENOUS

## 2021-07-08 MED ORDER — PROPOFOL 10 MG/ML IV BOLUS
INTRAVENOUS | Status: DC | PRN
  Administered 2021-07-08: 120 mg via INTRAVENOUS
  Administered 2021-07-08 (×3): 20 mg via INTRAVENOUS

## 2021-07-08 MED ORDER — FENTANYL CITRATE (PF) 100 MCG/2ML IJ SOLN
INTRAMUSCULAR | Status: DC | PRN
  Administered 2021-07-08 (×2): 50 ug via INTRAVENOUS
  Administered 2021-07-08: 100 ug via INTRAVENOUS
  Administered 2021-07-08: 50 ug via INTRAVENOUS

## 2021-07-08 MED ORDER — HEPARIN SODIUM (PORCINE) 1000 UNIT/ML IJ SOLN
INTRAMUSCULAR | Status: AC
  Filled 2021-07-08: qty 2

## 2021-07-08 MED ORDER — CHLORHEXIDINE GLUCONATE 0.12 % MT SOLN
15.0000 mL | Freq: Once | OROMUCOSAL | Status: AC
  Administered 2021-07-08: 15 mL via OROMUCOSAL
  Filled 2021-07-08: qty 15

## 2021-07-08 MED ORDER — PROTAMINE SULFATE 10 MG/ML IV SOLN
INTRAVENOUS | Status: AC
  Filled 2021-07-08: qty 5

## 2021-07-08 MED ORDER — PHENYLEPHRINE 40 MCG/ML (10ML) SYRINGE FOR IV PUSH (FOR BLOOD PRESSURE SUPPORT)
PREFILLED_SYRINGE | INTRAVENOUS | Status: AC
  Filled 2021-07-08: qty 10

## 2021-07-08 MED ORDER — LIDOCAINE 2% (20 MG/ML) 5 ML SYRINGE
INTRAMUSCULAR | Status: AC
  Filled 2021-07-08: qty 10

## 2021-07-08 MED ORDER — SUCCINYLCHOLINE CHLORIDE 200 MG/10ML IV SOSY
PREFILLED_SYRINGE | INTRAVENOUS | Status: AC
  Filled 2021-07-08: qty 20

## 2021-07-08 MED ORDER — SUGAMMADEX SODIUM 200 MG/2ML IV SOLN
INTRAVENOUS | Status: DC | PRN
  Administered 2021-07-08: 100 mg via INTRAVENOUS

## 2021-07-08 MED ORDER — FENTANYL CITRATE (PF) 250 MCG/5ML IJ SOLN
INTRAMUSCULAR | Status: AC
  Filled 2021-07-08: qty 5

## 2021-07-08 MED ORDER — ONDANSETRON HCL 4 MG/2ML IJ SOLN
INTRAMUSCULAR | Status: DC | PRN
  Administered 2021-07-08: 4 mg via INTRAVENOUS

## 2021-07-08 MED ORDER — ORAL CARE MOUTH RINSE: 15.0000 mL | Freq: Once | OROMUCOSAL | Status: AC

## 2021-07-08 MED ORDER — LACTATED RINGERS IV SOLN: INTRAVENOUS | Status: DC

## 2021-07-08 MED ORDER — ALBUMIN HUMAN 5 % IV SOLN: INTRAVENOUS | Status: DC | PRN

## 2021-07-08 MED ORDER — PROPOFOL 10 MG/ML IV BOLUS
INTRAVENOUS | Status: AC
  Filled 2021-07-08: qty 20

## 2021-07-08 MED ORDER — ROCURONIUM BROMIDE 10 MG/ML (PF) SYRINGE
PREFILLED_SYRINGE | INTRAVENOUS | Status: AC
  Filled 2021-07-08: qty 10

## 2021-07-08 MED ORDER — EPHEDRINE 5 MG/ML INJ
INTRAVENOUS | Status: AC
  Filled 2021-07-08: qty 15

## 2021-07-08 MED ORDER — 0.9 % SODIUM CHLORIDE (POUR BTL) OPTIME
TOPICAL | Status: DC | PRN
  Administered 2021-07-08: 2000 mL

## 2021-07-08 MED ORDER — SUCCINYLCHOLINE CHLORIDE 200 MG/10ML IV SOSY
PREFILLED_SYRINGE | INTRAVENOUS | Status: DC | PRN
  Administered 2021-07-08: 100 mg via INTRAVENOUS

## 2021-07-08 MED ORDER — LIDOCAINE HCL (CARDIAC) PF 100 MG/5ML IV SOSY
PREFILLED_SYRINGE | INTRAVENOUS | Status: DC | PRN
  Administered 2021-07-08: 40 mg via INTRAVENOUS

## 2021-07-08 MED ORDER — EPHEDRINE SULFATE-NACL 50-0.9 MG/10ML-% IV SOSY
PREFILLED_SYRINGE | INTRAVENOUS | Status: DC | PRN
  Administered 2021-07-08: 10 mg via INTRAVENOUS

## 2021-07-08 MED ORDER — ONDANSETRON HCL 4 MG/2ML IJ SOLN
INTRAMUSCULAR | Status: AC
  Filled 2021-07-08: qty 2

## 2021-07-08 MED ORDER — DEXAMETHASONE SODIUM PHOSPHATE 10 MG/ML IJ SOLN
INTRAMUSCULAR | Status: AC
  Filled 2021-07-08: qty 1

## 2021-07-08 MED ORDER — PROTAMINE SULFATE 10 MG/ML IV SOLN
INTRAVENOUS | Status: DC | PRN
  Administered 2021-07-08: 100 mg via INTRAVENOUS
  Administered 2021-07-08: 50 mg via INTRAVENOUS

## 2021-07-08 MED ORDER — HEPARIN 6000 UNIT IRRIGATION SOLUTION
Status: DC | PRN
  Administered 2021-07-08: 1

## 2021-07-08 MED ORDER — ROCURONIUM BROMIDE 100 MG/10ML IV SOLN
INTRAVENOUS | Status: DC | PRN
  Administered 2021-07-08: 70 mg via INTRAVENOUS

## 2021-07-08 MED ORDER — PHENYLEPHRINE HCL-NACL 10-0.9 MG/250ML-% IV SOLN
INTRAVENOUS | Status: DC | PRN
  Administered 2021-07-08: 50 ug/min via INTRAVENOUS

## 2021-07-08 MED ORDER — HEPARIN 6000 UNIT IRRIGATION SOLUTION
Status: AC
  Filled 2021-07-08: qty 500

## 2021-07-08 MED ORDER — PROTAMINE SULFATE 10 MG/ML IV SOLN
INTRAVENOUS | Status: AC
  Filled 2021-07-08: qty 25

## 2021-07-08 MED ORDER — DEXAMETHASONE SODIUM PHOSPHATE 10 MG/ML IJ SOLN
INTRAMUSCULAR | Status: DC | PRN
  Administered 2021-07-08: 5 mg via INTRAVENOUS

## 2021-07-08 MED ORDER — PHENYLEPHRINE HCL (PRESSORS) 10 MG/ML IV SOLN
INTRAVENOUS | Status: DC | PRN
  Administered 2021-07-08: 200 ug via INTRAVENOUS
  Administered 2021-07-08 (×2): 100 ug via INTRAVENOUS

## 2021-07-08 SURGICAL SUPPLY — 50 items
ADH SKN CLS APL DERMABOND .7 (GAUZE/BANDAGES/DRESSINGS) ×1
AGENT HMST SPONGE THK3/8 (HEMOSTASIS) ×1
CANISTER SUCT 3000ML PPV (MISCELLANEOUS) ×2 IMPLANT
CANNULA VESSEL 3MM 2 BLNT TIP (CANNULA) ×1 IMPLANT
CATH EMB 4FR 40CM (CATHETERS) ×2 IMPLANT
CATH FOLEY LATEX FREE 16FR (CATHETERS) ×2
CATH FOLEY LF 16FR (CATHETERS) IMPLANT
CLIP VESOCCLUDE MED 24/CT (CLIP) ×2 IMPLANT
CLIP VESOCCLUDE SM WIDE 24/CT (CLIP) ×2 IMPLANT
CUFF TOURN SGL QUICK 24 (TOURNIQUET CUFF)
CUFF TOURN SGL QUICK 42 (TOURNIQUET CUFF) IMPLANT
CUFF TRNQT CYL 24X4X16.5-23 (TOURNIQUET CUFF) IMPLANT
DERMABOND ADVANCED (GAUZE/BANDAGES/DRESSINGS) ×1
DERMABOND ADVANCED .7 DNX12 (GAUZE/BANDAGES/DRESSINGS) IMPLANT
DRAIN HEMOVAC 1/8 X 5 (WOUND CARE) IMPLANT
DRAPE X-RAY CASS 24X20 (DRAPES) IMPLANT
DRSG TEGADERM 4X4.75 (GAUZE/BANDAGES/DRESSINGS) ×2 IMPLANT
ELECT REM PT RETURN 9FT ADLT (ELECTROSURGICAL) ×2
ELECTRODE REM PT RTRN 9FT ADLT (ELECTROSURGICAL) ×1 IMPLANT
EVACUATOR SILICONE 100CC (DRAIN) IMPLANT
GAUZE 4X4 16PLY ~~LOC~~+RFID DBL (SPONGE) ×1 IMPLANT
GAUZE SPONGE 4X4 12PLY STRL (GAUZE/BANDAGES/DRESSINGS) ×2 IMPLANT
GLOVE SURG ENC MOIS LTX SZ7.5 (GLOVE) ×2 IMPLANT
GOWN STRL REUS W/ TWL LRG LVL3 (GOWN DISPOSABLE) ×3 IMPLANT
GOWN STRL REUS W/TWL LRG LVL3 (GOWN DISPOSABLE) ×6
HEMOSTAT SPONGE AVITENE ULTRA (HEMOSTASIS) ×1 IMPLANT
KIT BASIN OR (CUSTOM PROCEDURE TRAY) ×2 IMPLANT
KIT TURNOVER KIT B (KITS) ×2 IMPLANT
NS IRRIG 1000ML POUR BTL (IV SOLUTION) ×4 IMPLANT
PACK PERIPHERAL VASCULAR (CUSTOM PROCEDURE TRAY) ×2 IMPLANT
PAD ARMBOARD 7.5X6 YLW CONV (MISCELLANEOUS) ×4 IMPLANT
PATCH VASC XENOSURE 1CMX6CM (Vascular Products) ×2 IMPLANT
PATCH VASC XENOSURE 1X6 (Vascular Products) IMPLANT
SPONGE T-LAP 18X18 ~~LOC~~+RFID (SPONGE) ×1 IMPLANT
STOPCOCK MORSE 400PSI 3WAY (MISCELLANEOUS) ×2 IMPLANT
SUT PROLENE 5 0 C 1 24 (SUTURE) ×2 IMPLANT
SUT PROLENE 6 0 CC (SUTURE) ×3 IMPLANT
SUT PROLENE 7 0 BV 1 (SUTURE) ×1 IMPLANT
SUT PROLENE 7 0 BV1 MDA (SUTURE) IMPLANT
SUT SILK 2 0 SH (SUTURE) ×1 IMPLANT
SUT SILK 3 0 (SUTURE)
SUT SILK 3-0 18XBRD TIE 12 (SUTURE) IMPLANT
SUT VIC AB 2-0 CTX 36 (SUTURE) ×4 IMPLANT
SUT VIC AB 3-0 SH 27 (SUTURE) ×4
SUT VIC AB 3-0 SH 27X BRD (SUTURE) ×2 IMPLANT
SUT VIC AB 4-0 PS2 27 (SUTURE) IMPLANT
SYR 3ML LL SCALE MARK (SYRINGE) ×2 IMPLANT
TOWEL GREEN STERILE (TOWEL DISPOSABLE) ×2 IMPLANT
UNDERPAD 30X36 HEAVY ABSORB (UNDERPADS AND DIAPERS) ×2 IMPLANT
WATER STERILE IRR 1000ML POUR (IV SOLUTION) ×2 IMPLANT

## 2021-07-08 NOTE — Progress Notes (Signed)
[facility]  Date of Admission:  06/15/2021     CC: Pseudomonas bsi, vascular graft infection  Lines: peripheral  Abx: 7/14-c cefepime  ASSESSMENT: 70 yo with hx CAD s/p CABG x4 2014, subdural hematoma s/p craniotomy (2020, with residual balance deficits and delayed processing), ischemic cardiomyopathy, right popliteal bypass with PTFE graft 01/23/2020, recent admission 06/07/21 with occluded R fem pop bypass s/p thrombolysis, mechanical thrombectomy of R femoropoliteal bypass graft, stent x2 of the right femoral bypass graft, readmitted 6/25 for confusion/weakness found to have mild covid infection s/p remdesivir-steroid and multiple bacterial species in urine s/p 5 day abx, course complicated by hemorrhage blood loss from right fem-pop surgical site s/p exploration 7/07, right foot gangrene s/p aka 7/12 during which sign of infection around the graft was found (cx PsA), and subsequently pseudomonas bsi on 7/14.  7/12 operative wound cx RLE psA 7/14 bcx psA (S cipro, cefepime) 7/15 bcx negative  Suspect graft infection after 6/17 admission Pending graft removal per vascular surgery for 7/18    PLAN: Continue cefepime Await graft removal After graft removal would treat 4 weeks (if entire graft can be removed) Discussed with primary team  I spent more than 35 minute reviewing data/chart, and coordinating care and >50% direct face to face time providing counseling/discussing diagnostics/treatment plan with patient    Active Problems:   Acute metabolic encephalopathy   AKI (acute kidney injury) (HCC)   Status post above-knee amputation of right lower extremity (HCC)   Altered mental status   Bacteremia due to Pseudomonas   Allergies  Allergen Reactions   Ace Inhibitors Swelling and Other (See Comments)    Angioedema   Eggs Or Egg-Derived Products Nausea Only and Other (See Comments)    Cannot eat prepared eggs- Occasional nausea    Lactose Intolerance (Gi) Diarrhea, Nausea Only and Other (See Comments)    Flatulence, also   Latex Itching    Scheduled Meds:  sodium chloride   Intravenous Once   sodium chloride   Intravenous Once   aspirin EC  81 mg Oral Daily   bethanechol  10 mg Oral TID   Chlorhexidine Gluconate Cloth  6 each Topical Daily   citalopram  20 mg Oral Daily   clopidogrel  75 mg Oral Daily   diphenhydrAMINE  25 mg Oral Once   ferrous fumarate-b12-vitamic C-folic acid  1 capsule Oral Daily   gabapentin  100 mg Oral Daily   gabapentin  300 mg Oral QHS   melatonin  10 mg Oral QHS   metoprolol tartrate  12.5 mg Oral BID   multivitamin with minerals  1 tablet Oral Daily   nicotine  7 mg Transdermal Daily   pantoprazole  40 mg Oral QPM   QUEtiapine  100 mg Oral BID   senna-docusate  1 tablet Oral BID   sodium chloride flush  10-40 mL Intracatheter Q12H   tamsulosin  0.8 mg Oral QPM   Continuous Infusions:  ceFEPime (MAXIPIME) IV 2 g (07/08/21 0552)   lactated ringers 125 mL/hr at 07/08/21 0447   PRN Meds:.albuterol, clonazePAM, guaiFENesin-dextromethorphan, HYDROmorphone (DILAUDID) injection, ketorolac, ondansetron, oxyCODONE, polyethylene glycol, prochlorperazine, sodium chloride flush, traMADol   SUBJECTIVE: Feelign hungry Waiting for graft removal No n/v/diarrhea No back pain No cough No rash Right aka site healing well   Review of Systems: ROS All other ROS was negative, except mentioned above     OBJECTIVE: Vitals:   07/08/21 0400 07/08/21 0435  07/08/21 0806 07/08/21 1232  BP: 115/73 115/73 (!) 149/93 (!) 141/75  Pulse: 67 67 71 63  Resp: 18  20 18   Temp: 98.6 F (37 C)  99.1 F (37.3 C) 98.9 F (37.2 C)  TempSrc: Oral Oral Oral Oral  SpO2: 98%  99% 99%  Weight:  76.6 kg    Height:       Body mass index is 21.68 kg/m.  Physical Exam General/constitutional: no distress, pleasant HEENT: Normocephalic, PER, Conj Clear, EOMI, Oropharynx clear Neck supple CV: rrr  no mrg Lungs: clear to auscultation, normal respiratory effort Abd: Soft, Nontender Ext: no edema Skin: No Rash Neuro: nonfocal MSK: right aka site staples intact no dehiscence, swelling, tenderness, erythema   Lab Results Lab Results  Component Value Date   WBC 15.4 (H) 07/08/2021   HGB 9.6 (L) 07/08/2021   HCT 30.3 (L) 07/08/2021   MCV 83.5 07/08/2021   PLT 453 (H) 07/08/2021    Lab Results  Component Value Date   CREATININE 0.59 (L) 07/08/2021   BUN 8 07/08/2021   NA 136 07/08/2021   K 4.0 07/08/2021   CL 106 07/08/2021   CO2 22 07/08/2021    Lab Results  Component Value Date   ALT 213 (H) 07/08/2021   AST 51 (H) 07/08/2021   ALKPHOS 164 (H) 07/08/2021   BILITOT 0.7 07/08/2021      Microbiology: Recent Results (from the past 240 hour(s))  Aerobic Culture w Gram Stain (superficial specimen)     Status: None   Collection Time: 07/02/21  4:13 PM   Specimen: PATH Other; Tissue  Result Value Ref Range Status   Specimen Description WOUND RIGHT LEG  Final   Special Requests BYPASS  GRAFT PT ON ANCEF  Final   Gram Stain   Final    MODERATE WBC PRESENT,BOTH PMN AND MONONUCLEAR NO ORGANISMS SEEN Performed at [facility], [address] [place], [place] [address]    Culture MODERATE PSEUDOMONAS AERUGINOSA  Final   Report Status 07/05/2021 FINAL  Final   Organism ID, Bacteria PSEUDOMONAS AERUGINOSA  Final      Susceptibility   Pseudomonas aeruginosa - MIC*    CEFTAZIDIME 2 SENSITIVE Sensitive     CIPROFLOXACIN <=0.25 SENSITIVE Sensitive     GENTAMICIN <=1 SENSITIVE Sensitive     IMIPENEM 1 SENSITIVE Sensitive     PIP/TAZO <=4 SENSITIVE Sensitive     CEFEPIME 2 SENSITIVE Sensitive     * MODERATE PSEUDOMONAS AERUGINOSA  Culture, blood (routine x 2)     Status: Abnormal   Collection Time: 07/04/21  8:49 AM   Specimen: BLOOD LEFT ARM  Result Value Ref Range Status   Specimen Description BLOOD LEFT ARM  Final   Special Requests   Final    BOTTLES DRAWN  AEROBIC ONLY Blood Culture adequate volume   Culture  Setup Time   Final    GRAM NEGATIVE RODS AEROBIC BOTTLE ONLY CRITICAL VALUE NOTED.  VALUE IS CONSISTENT WITH PREVIOUSLY REPORTED AND CALLED VALUE.    Culture (A)  Final    PSEUDOMONAS AERUGINOSA SUSCEPTIBILITIES PERFORMED ON PREVIOUS CULTURE WITHIN THE LAST 5 DAYS. Performed at [facility], [address] [place], [place] [address]    Report Status 07/08/2021 FINAL  Final  Culture, blood (routine x 2)     Status: Abnormal   Collection Time: 07/04/21  8:53 AM   Specimen: BLOOD RIGHT HAND  Result Value Ref Range Status   Specimen Description BLOOD RIGHT  HAND  Final   Special Requests   Final    BOTTLES DRAWN AEROBIC ONLY Blood Culture results may not be optimal due to an inadequate volume of blood received in culture bottles   Culture  Setup Time   Final    GRAM NEGATIVE RODS AEROBIC BOTTLE ONLY CRITICAL RESULT CALLED TO, READ BACK BY AND VERIFIED WITH: Dorthy Cooler [phone] 07/05/2021 Girtha Hake Performed at [facility], [address], [place], [place] [address]    Culture PSEUDOMONAS AERUGINOSA (A)  Final   Report Status 07/08/2021 FINAL  Final   Organism ID, Bacteria PSEUDOMONAS AERUGINOSA  Final      Susceptibility   Pseudomonas aeruginosa - MIC*    CEFTAZIDIME 4 SENSITIVE Sensitive     CIPROFLOXACIN <=0.25 SENSITIVE Sensitive     GENTAMICIN <=1 SENSITIVE Sensitive     IMIPENEM 1 SENSITIVE Sensitive     PIP/TAZO <=4 SENSITIVE Sensitive     CEFEPIME 2 SENSITIVE Sensitive     * PSEUDOMONAS AERUGINOSA  Blood Culture ID Panel (Reflexed)     Status: Abnormal   Collection Time: 07/04/21  8:53 AM  Result Value Ref Range Status   Enterococcus faecalis NOT DETECTED NOT DETECTED Final   Enterococcus Faecium NOT DETECTED NOT DETECTED Final   Listeria monocytogenes NOT DETECTED NOT DETECTED Final   Staphylococcus species NOT DETECTED NOT DETECTED Final   Staphylococcus aureus (BCID) NOT DETECTED NOT DETECTED Final    Staphylococcus epidermidis NOT DETECTED NOT DETECTED Final   Staphylococcus lugdunensis NOT DETECTED NOT DETECTED Final   Streptococcus species NOT DETECTED NOT DETECTED Final   Streptococcus agalactiae NOT DETECTED NOT DETECTED Final   Streptococcus pneumoniae NOT DETECTED NOT DETECTED Final   Streptococcus pyogenes NOT DETECTED NOT DETECTED Final   A.calcoaceticus-baumannii NOT DETECTED NOT DETECTED Final   Bacteroides fragilis NOT DETECTED NOT DETECTED Final   Enterobacterales NOT DETECTED NOT DETECTED Final   Enterobacter cloacae complex NOT DETECTED NOT DETECTED Final   Escherichia coli NOT DETECTED NOT DETECTED Final   Klebsiella aerogenes NOT DETECTED NOT DETECTED Final   Klebsiella oxytoca NOT DETECTED NOT DETECTED Final   Klebsiella pneumoniae NOT DETECTED NOT DETECTED Final   Proteus species NOT DETECTED NOT DETECTED Final   Salmonella species NOT DETECTED NOT DETECTED Final   Serratia marcescens NOT DETECTED NOT DETECTED Final   Haemophilus influenzae NOT DETECTED NOT DETECTED Final   Neisseria meningitidis NOT DETECTED NOT DETECTED Final   Pseudomonas aeruginosa DETECTED (A) NOT DETECTED Final    Comment: CRITICAL RESULT CALLED TO, READ BACK BY AND VERIFIED WITH: J. LEDFORD,PHARMD [id number] 07/05/2021 T. TYSOR    Stenotrophomonas maltophilia NOT DETECTED NOT DETECTED Final   Candida albicans NOT DETECTED NOT DETECTED Final   Candida auris NOT DETECTED NOT DETECTED Final   Candida glabrata NOT DETECTED NOT DETECTED Final   Candida krusei NOT DETECTED NOT DETECTED Final   Candida parapsilosis NOT DETECTED NOT DETECTED Final   Candida tropicalis NOT DETECTED NOT DETECTED Final   Cryptococcus neoformans/gattii NOT DETECTED NOT DETECTED Final   CTX-M ESBL NOT DETECTED NOT DETECTED Final   Carbapenem resistance IMP NOT DETECTED NOT DETECTED Final   Carbapenem resistance KPC NOT DETECTED NOT DETECTED Final   Carbapenem resistance NDM NOT DETECTED NOT DETECTED Final   Carbapenem  resistance VIM NOT DETECTED NOT DETECTED Final    Comment: Performed at [facility], [address] [place], [place] [address]  Urine Culture     Status: None   Collection Time: 07/04/21  11:25 PM   Specimen: Urine, Catheterized  Result Value Ref Range Status   Specimen Description URINE, CATHETERIZED  Final   Special Requests NONE  Final   Culture   Final    NO GROWTH Performed at [facility], [address] [place], [place] [address]    Report Status 07/06/2021 FINAL  Final  Culture, blood (routine x 2)     Status: None (Preliminary result)   Collection Time: 07/05/21  4:37 PM   Specimen: BLOOD LEFT HAND  Result Value Ref Range Status   Specimen Description BLOOD LEFT HAND  Final   Special Requests   Final    BOTTLES DRAWN AEROBIC ONLY Blood Culture adequate volume   Culture   Final    NO GROWTH 3 DAYS Performed at [facility], [address] [place], [place] [address]    Report Status PENDING  Incomplete  Culture, blood (routine x 2)     Status: None (Preliminary result)   Collection Time: 07/05/21  4:37 PM   Specimen: BLOOD  Result Value Ref Range Status   Specimen Description BLOOD LEFT ANTECUBITAL  Final   Special Requests   Final    BOTTLES DRAWN AEROBIC ONLY Blood Culture results may not be optimal due to an inadequate volume of blood received in culture bottles   Culture   Final    NO GROWTH 3 DAYS Performed at [facility], [address], [place], [place] [address]    Report Status PENDING  Incomplete     Serology:   Imaging: If present, new imagings (plain films, ct scans, and mri) have been personally visualized and interpreted; radiology reports have been reviewed. Decision making incorporated into the Impression / Recommendations.   Raymondo Band, MD [facility] [phone] pager    07/08/2021, 2:12 PM p

## 2021-07-08 NOTE — Consult Note (Signed)
   [facility] Inpatient Consult   07/08/2021  Andric Kerce Sobczyk 27-Jul-1951 [id number]  [facility] [[facility]]  Accountable Care Organization [ACO] Patient: Advertising copywriter Medicare  Primary Care Provider:  [name], MD is an Embedded provider, [facility]] which has an Embedded chronic care management team   LLOS: 22 days  Patient screened for  length of stay hospitalization with noted high risk score for unplanned readmission risk Review of patient's medical record reveals patient is still being recommended for a skilled nursing facility [SNF] length of stay with notes of PT/OT evaluations.   Plan:  No current follow up for [facility] Management, if patient transitions to a skilled nursing facility level of care.    For questions contact:   Charlesetta Shanks, RN BSN CCM [facility]  [phone] business mobile phone Toll free office [phone]  Fax number: [phone] [email or web address]

## 2021-07-08 NOTE — Progress Notes (Signed)
PROGRESS NOTE    Lonnie [name]  TMH:[id number] DOB: 10-Jun-1951 DOA: 06/15/2021 PCP: [name], MD   No chief complaint on file.  Brief Narrative:  70 yo with hx CAD s/p CABG x4 2014, subdural hematoma s/p craniotomy (2020, with residual balance deficits and delayed processing), ischemic cardiomyopathy, right popliteal bypass with PTFE graft 01/23/2020, recent admission 06/07/21 with occluded R fem pop bypass s/p thrombolysis, mechanical thrombectomy of R femoropoliteal bypass graft, stent x2 of the right femoral bypass graft.  He was readmitted 6/25 with weakness and confusion and found to have bladder rentention and possible UTI (multiple species).  His covid testing was positive and he was treated with steroids and remdesivir.  He developed spontaneous bleeding on 7/6 from prior area of R femoropopliteal bypass and then large volume bleeding with hemorrhagic shock on 7/7.  He was taken to the OR by Dr. Chestine Spore on 7/7 for exploration of R below knee popliteal wound, repair of R popliteal vein, ligation of R popliteal artery, and placement of negative pressure wound vac.  He had right AKA on 7/12 with gangrene of R foot.  Assessment & Plan:   Active Problems:   Acute metabolic encephalopathy   AKI (acute kidney injury) (HCC)   Status post above-knee amputation of right lower extremity (HCC)   Altered mental status   Bacteremia due to Pseudomonas  Sepsis secondary to pseudomonas bacteremia Graft segment sent for culture with moderate pseudomonas 7/12 - sensitive to cefepime UA bland, CXR without acute disease RUQ [place] with mild fatty infiltration of the liver 2/2 blood cultures with pseudomonas, sensitive to cefepime Follow repeat blood cultures NGTDx3  Vascular following appreciate recs - planning graft removal right leg on 7/18 (today) ID c/s, appreciate recs - recommending removal of graft, appreciate additional recs Echo with EF 50%, RWMA  Elevated Liver Enzymes  Fatty Liver   Likely shock liver, improving  Hepatocellular pattern of liver injury -> continues to improve Negative acute hepatitis panel Minimize meds which could contribute to liver injury RUQ with mild fatty infiltration of liver  Acute Metabolic Encephalopathy Suspect related to above - significantly improved  Delirium precautions W/u further as indicated  Acute Blood Loss Anemia with Hypovolemic Shock Occluded Right Femoropopliteal Bypass s/p lysis complicated by bleeding with disruption and reocclusion of graft S/p right above knee amputation  S/p exploration of R below knee popliteal wound, repair of R popliteal vein, ligation of right popliteal artery and placement of negative pressure wound vac on 7/7 S/p right AKA 7/12 Wound cx with pseudomonas as above - treatment as noted above S/p 4 units pRBC (I see 5 units ordered, only 4 documented as being completed, one as "transfusing" - unclear) Continue aspirin, plavix Gabapentin for phantom pain  Hb relatively stable  Leukocytosis See sepsis  Severe PAD  CAD s/p CABG x4 Aspirin, plavix Not currently on statin, unclear reason, will review (elevated LFT's at this time, follow, consider if improved) Continue metoprolol F/u with cardiology and vascular outpatient   LUTS  BPH S/p foley on admission Foley now removed Needs urology f/u outpatient Flomax, bethanecol  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 Celexa 20 mg, seroquel 100 mg BID  Systolic  Diastolic HF Appears compensated, follow  COVID 19 Virus infection S/p remdesivir/decadron  DVT prophylaxis: SCD Code Status: dnr Family Communication: daughter, Lonnie [name] 7/17 Disposition:   Status is: Inpatient  Remains inpatient appropriate because:Inpatient level of care appropriate due to severity of illness  Dispo:  Patient  From:    Planned Disposition: Skilled Nursing Facility  Medically stable for discharge:            Consultants:  vascular  Procedures:  LE US Summary:  RIGHT:  - There is no evidence of deep vein thrombosis in the lower extremity.     - No cystic structure found in the popliteal fossa.     - Incidental: Extensive fluid surrounding stent at popliteal fossa. Flow  is identified within the stent at the level of the popliteal.   - Ultrasound characteristics of enlarged lymph nodes are noted in the  groin.     LEFT:  - There is no evidence of deep vein thrombosis in the lower extremity.     - No cystic structure found in the popliteal fossa.      Antimicrobials:  Anti-infectives (From admission, onward)    Start     Dose/Rate Route Frequency Ordered Stop   07/04/21 2200  vancomycin (VANCOCIN) IVPB 1000 mg/200 mL premix  Status:  Discontinued        1,000 mg 200 mL/hr over 60 Minutes Intravenous Every 12 hours 07/04/21 0906 07/05/21 0802   07/04/21 1000  vancomycin (VANCOREADY) IVPB 1500 mg/300 mL        1,500 mg 150 mL/hr over 120 Minutes Intravenous  Once 07/04/21 0906 07/04/21 1348   07/04/21 1000  ceFEPIme (MAXIPIME) 2 g in sodium chloride 0.9 % 100 mL IVPB        2 g 200 mL/hr over 30 Minutes Intravenous Every 8 hours 07/04/21 0906     07/02/21 2200  ceFAZolin (ANCEF) IVPB 1 g/50 mL premix  Status:  Discontinued        1 g 100 mL/hr over 30 Minutes Intravenous Every 8 hours 07/02/21 1816 07/04/21 0822   06/17/21 1400  ceFEPIme (MAXIPIME) 2 g in sodium chloride 0.9 % 100 mL IVPB        2 g 200 mL/hr over 30 Minutes Intravenous Every 12 hours 06/17/21 1258 06/19/21 0955   06/17/21 1000  remdesivir 100 mg in sodium chloride 0.9 % 100 mL IVPB       See Hyperspace for full Linked Orders Report.   100 mg 200 mL/hr over 30 Minutes Intravenous Daily 06/16/21 0135 06/18/21 1026   06/16/21 2200  cefTRIAXone (ROCEPHIN) 2 g in sodium chloride 0.9 % 100 mL IVPB  Status:  Discontinued        2 g 200 mL/hr over 30 Minutes Intravenous Every 24 hours 06/15/21 2304 06/17/21 1218    06/16/21 2200  vancomycin (VANCOREADY) IVPB 1000 mg/200 mL  Status:  Discontinued        1,000 mg 200 mL/hr over 60 Minutes Intravenous Every 12 hours 06/16/21 0820 06/17/21 1218   06/16/21 0915  vancomycin (VANCOREADY) IVPB 1750 mg/350 mL        1,750 mg 175 mL/hr over 120 Minutes Intravenous  Once 06/16/21 0820 06/16/21 1114   06/16/21 0230  remdesivir 200 mg in sodium chloride 0.9% 250 mL IVPB       See Hyperspace for full Linked Orders Report.   200 mg 580 mL/hr over 30 Minutes Intravenous Once 06/16/21 0135 06/16/21 0324   06/15/21 2230  linezolid (ZYVOX) IVPB 600 mg  Status:  Discontinued        600 mg 300 mL/hr over 60 Minutes Intravenous Every 12 hours 06/15/21 2228 06/16/21 0811   06/15/21 2115  cefTRIAXone (ROCEPHIN) 2 g in sodium chloride 0.9 % 100 mL IVPB  2 g 200 mL/hr over 30 Minutes Intravenous  Once 06/15/21 2114 06/15/21 2328          Subjective: In good spirits, no complaints  Objective: Vitals:   07/07/21 2350 07/08/21 0400 07/08/21 0435 07/08/21 0806  BP: 117/66 115/73 115/73 (!) 149/93  Pulse: 67 67 67 71  Resp: Temp: 98.1 F (36.7 C) 98.6 F (37 C)  99.1 F (37.3 C)  TempSrc: Oral Oral Oral Oral  SpO2: 97% 98%  99%  Weight:   76.6 kg   Height:        Intake/Output Summary (Last 24 hours) at 07/08/2021 0827 Last data filed at 07/08/2021 0810 Gross per 24 hour  Intake 1374.39 ml  Output 1800 ml  Net -425.61 ml   Filed Weights   07/04/21 0345 07/07/21 0526 07/08/21 0435  Weight: 72.1 kg 74.6 kg 76.6 kg    Examination:  General: No acute distress. Cardiovascular: RRR Lungs: unlabored Abdomen: Soft, nontender, nondistended Neurological: Alert and oriented 3. Moves all extremities 4. Cranial nerves II through XII grossly intact. Skin: Warm and dry. No rashes or lesions. Extremities: RLE AKA, incision intact without signs of infection      Data Reviewed: I have personally reviewed following labs and imaging  studies  CBC: Recent Labs  Lab 07/04/21 0214 07/05/21 0143 07/05/21 1637 07/06/21 0545 07/07/21 0056 07/08/21 0519  WBC 27.8* 26.7*  --  20.7* 18.8* 15.4*  NEUTROABS 25.8* 23.1*  --  17.5* 14.9* 11.6*  HGB 7.9* 6.7* 9.6* 9.4* 9.0* 9.6*  HCT 25.1* 21.4* 29.5* 29.2* 28.3* 30.3*  MCV 80.2 81.1  --  82.0 81.8 83.5  PLT 455* 354  --  334 360 453*    Basic Metabolic Panel: Recent Labs  Lab 07/04/21 0214 07/05/21 0143 07/06/21 0545 07/07/21 0056 07/08/21 0519  NA 132* 135 136 136 136  K 3.8 3.7 3.9 4.3 4.0  CL 99 104 106 108 106  CO2 GLUCOSE 138* 133* 105* 108* 113*  BUN CREATININE 0.89 0.90 0.77 0.65 0.59*  CALCIUM 8.5* 8.6* 8.3* 8.0* 8.4*  MG 1.9 2.1 2.0 2.0 1.9  PHOS 2.9 3.7 2.4* 2.4* 2.9    GFR: Estimated Creatinine Clearance: 95.8 mL/min (Lanore Renderos) (by C-G formula based on SCr of 0.59 mg/dL (L)).  Liver Function Tests: Recent Labs  Lab 07/04/21 0214 07/05/21 0143 07/06/21 0545 07/07/21 0056 07/08/21 0519  AST 1,255* 629* 147* 90* 51*  ALT 887* 771* 391* 285* 213*  ALKPHOS 486* 323* 235* 189* 164*  BILITOT 1.6* 1.7* 1.3* 0.9 0.7  PROT 6.4* 5.8* 5.5* 5.5* 6.0*  ALBUMIN 2.4* 2.0* 1.9* 1.9* 2.0*    CBG: Recent Labs  Lab 07/04/21 1721  GLUCAP 128*     Recent Results (from the past 240 hour(s))  Aerobic Culture w Gram Stain (superficial specimen)     Status: None   Collection Time: 07/02/21  4:13 PM   Specimen: PATH Other; Tissue  Result Value Ref Range Status   Specimen Description WOUND RIGHT LEG  Final   Special Requests BYPASS  GRAFT PT ON ANCEF  Final   Gram Stain   Final    MODERATE WBC PRESENT,BOTH PMN AND MONONUCLEAR NO ORGANISMS SEEN Performed at [facility], [address] [place], [place] [address]    Culture MODERATE PSEUDOMONAS AERUGINOSA  Final   Report Status 07/05/2021 FINAL  Final   Organism ID, Bacteria PSEUDOMONAS AERUGINOSA  Final      Susceptibility   Pseudomonas aeruginosa - MIC*     CEFTAZIDIME 2 SENSITIVE Sensitive     CIPROFLOXACIN <=0.25 SENSITIVE Sensitive     GENTAMICIN <=1 SENSITIVE Sensitive     IMIPENEM 1 SENSITIVE Sensitive     PIP/TAZO <=4 SENSITIVE Sensitive     CEFEPIME 2 SENSITIVE Sensitive     * MODERATE PSEUDOMONAS AERUGINOSA  Culture, blood (routine x 2)     Status: Abnormal (Preliminary result)   Collection Time: 07/04/21  8:49 AM   Specimen: BLOOD LEFT ARM  Result Value Ref Range Status   Specimen Description BLOOD LEFT ARM  Final   Special Requests   Final    BOTTLES DRAWN AEROBIC ONLY Blood Culture adequate volume   Culture  Setup Time   Final    GRAM NEGATIVE RODS AEROBIC BOTTLE ONLY CRITICAL VALUE NOTED.  VALUE IS CONSISTENT WITH PREVIOUSLY REPORTED AND CALLED VALUE. Performed at [facility], [address], [place], [place] [address]    Culture PSEUDOMONAS AERUGINOSA (Arhaan Chesnut)  Final   Report Status PENDING  Incomplete  Culture, blood (routine x 2)     Status: Abnormal (Preliminary result)   Collection Time: 07/04/21  8:53 AM   Specimen: BLOOD RIGHT HAND  Result Value Ref Range Status   Specimen Description BLOOD RIGHT HAND  Final   Special Requests   Final    BOTTLES DRAWN AEROBIC ONLY Blood Culture results may not be optimal due to an inadequate volume of blood received in culture bottles   Culture  Setup Time   Final    GRAM NEGATIVE RODS AEROBIC BOTTLE ONLY CRITICAL RESULT CALLED TO, READ BACK BY AND VERIFIED WITH: J. LEDFORD,PHARMD 1914 07/05/2021 T. TYSOR    Culture (Tafari Humiston)  Final    PSEUDOMONAS AERUGINOSA SUSCEPTIBILITIES TO FOLLOW Performed at [facility], [address] [place], [place] [address]    Report Status PENDING  Incomplete  Blood Culture ID Panel (Reflexed)     Status: Abnormal   Collection Time: 07/04/21  8:53 AM  Result Value Ref Range Status   Enterococcus faecalis NOT DETECTED NOT DETECTED Final   Enterococcus Faecium NOT DETECTED NOT DETECTED Final   Listeria monocytogenes NOT DETECTED NOT DETECTED  Final   Staphylococcus species NOT DETECTED NOT DETECTED Final   Staphylococcus aureus (BCID) NOT DETECTED NOT DETECTED Final   Staphylococcus epidermidis NOT DETECTED NOT DETECTED Final   Staphylococcus lugdunensis NOT DETECTED NOT DETECTED Final   Streptococcus species NOT DETECTED NOT DETECTED Final   Streptococcus agalactiae NOT DETECTED NOT DETECTED Final   Streptococcus pneumoniae NOT DETECTED NOT DETECTED Final   Streptococcus pyogenes NOT DETECTED NOT DETECTED Final   Loletha Bertini.calcoaceticus-baumannii NOT DETECTED NOT DETECTED Final   Bacteroides fragilis NOT DETECTED NOT DETECTED Final   Enterobacterales NOT DETECTED NOT DETECTED Final   Enterobacter cloacae complex NOT DETECTED NOT DETECTED Final   Escherichia coli NOT DETECTED NOT DETECTED Final   Klebsiella aerogenes NOT DETECTED NOT DETECTED Final   Klebsiella oxytoca NOT DETECTED NOT DETECTED Final   Klebsiella pneumoniae NOT DETECTED NOT DETECTED Final   Proteus species NOT DETECTED NOT DETECTED Final   Salmonella species NOT DETECTED NOT DETECTED Final   Serratia marcescens NOT DETECTED NOT DETECTED Final   Haemophilus influenzae NOT DETECTED NOT DETECTED Final   Neisseria meningitidis NOT DETECTED NOT DETECTED Final   Pseudomonas aeruginosa DETECTED (Jalesa Thien) NOT DETECTED Final    Comment: CRITICAL RESULT CALLED TO, READ BACK BY AND VERIFIED  WITH: JGolden Pop [id number] 07/05/2021 T. TYSOR    Stenotrophomonas maltophilia NOT DETECTED NOT DETECTED Final   Candida albicans NOT DETECTED NOT DETECTED Final   Candida auris NOT DETECTED NOT DETECTED Final   Candida glabrata NOT DETECTED NOT DETECTED Final   Candida krusei NOT DETECTED NOT DETECTED Final   Candida parapsilosis NOT DETECTED NOT DETECTED Final   Candida tropicalis NOT DETECTED NOT DETECTED Final   Cryptococcus neoformans/gattii NOT DETECTED NOT DETECTED Final   CTX-M ESBL NOT DETECTED NOT DETECTED Final   Carbapenem resistance IMP NOT DETECTED NOT DETECTED Final    Carbapenem resistance KPC NOT DETECTED NOT DETECTED Final   Carbapenem resistance NDM NOT DETECTED NOT DETECTED Final   Carbapenem resistance VIM NOT DETECTED NOT DETECTED Final    Comment: Performed at [facility], [address] [place], [place] [address]  Urine Culture     Status: None   Collection Time: 07/04/21 11:25 PM   Specimen: Urine, Catheterized  Result Value Ref Range Status   Specimen Description URINE, CATHETERIZED  Final   Special Requests NONE  Final   Culture   Final    NO GROWTH Performed at [facility], [address] [place], [place] [address]    Report Status 07/06/2021 FINAL  Final  Culture, blood (routine x 2)     Status: None (Preliminary result)   Collection Time: 07/05/21  4:37 PM   Specimen: BLOOD LEFT HAND  Result Value Ref Range Status   Specimen Description BLOOD LEFT HAND  Final   Special Requests   Final    BOTTLES DRAWN AEROBIC ONLY Blood Culture adequate volume   Culture   Final    NO GROWTH 3 DAYS Performed at [facility], [address] [place], [place] [address]    Report Status PENDING  Incomplete  Culture, blood (routine x 2)     Status: None (Preliminary result)   Collection Time: 07/05/21  4:37 PM   Specimen: BLOOD  Result Value Ref Range Status   Specimen Description BLOOD LEFT ANTECUBITAL  Final   Special Requests   Final    BOTTLES DRAWN AEROBIC ONLY Blood Culture results may not be optimal due to an inadequate volume of blood received in culture bottles   Culture   Final    NO GROWTH 3 DAYS Performed at [facility], [address], [place], [place] [address]    Report Status PENDING  Incomplete         Radiology Studies: No results found.      Scheduled Meds:  sodium chloride   Intravenous Once   sodium chloride   Intravenous Once   aspirin EC  81 mg Oral Daily   bethanechol  10 mg Oral TID   Chlorhexidine Gluconate Cloth  6 each Topical Daily   citalopram  20 mg Oral Daily    clopidogrel  75 mg Oral Daily   diphenhydrAMINE  25 mg Oral Once   ferrous fumarate-b12-vitamic C-folic acid  1 capsule Oral Daily   gabapentin  100 mg Oral Daily   gabapentin  300 mg Oral QHS   melatonin  10 mg Oral QHS   metoprolol tartrate  12.5 mg Oral BID   multivitamin with minerals  1 tablet Oral Daily   nicotine  7 mg Transdermal Daily   pantoprazole  40 mg Oral QPM   QUEtiapine  100 mg Oral BID   senna-docusate  1 tablet Oral BID   sodium  chloride flush  10-40 mL Intracatheter Q12H   tamsulosin  0.8 mg Oral QPM   Continuous Infusions:  ceFEPime (MAXIPIME) IV 2 g (07/08/21 0552)   lactated ringers 125 mL/hr at 07/08/21 0447     LOS: 22 days    Time spent: over 30 min    Lacretia Nicks, MD [facility]   To contact the attending provider between 7A-7P or the covering provider during after hours 7P-7A, please log into the web site [email or web address] and access using universal [place] password for that web site. If you do not have the password, please call the hospital operator.  07/08/2021, 8:27 AM

## 2021-07-08 NOTE — Anesthesia Postprocedure Evaluation (Signed)
Anesthesia Post Note  Patient: Lonnie [name]  Procedure(s) Performed: REMOVAL RIGHT FEMORAL BYPASS GRAFT (Right: Groin) PATCH ANGIOPLASTY WITH 1X6 XENOSURE PATCH (Right: Groin)     Patient location during evaluation: PACU Anesthesia Type: General Level of consciousness: awake Pain management: pain level controlled Vital Signs Assessment: post-procedure vital signs reviewed and stable Respiratory status: spontaneous breathing Cardiovascular status: stable Postop Assessment: no apparent nausea or vomiting Anesthetic complications: no   No notable events documented.  Last Vitals:  Vitals:   07/08/21 1915 07/08/21 1930  BP: 126/68 133/66  Pulse: 78 74  Resp: 13 15  Temp:    SpO2: 98% 98%    Last Pain:  Vitals:   07/08/21 1915  TempSrc:   PainSc: 0-No pain                 Johneisha Broaden

## 2021-07-08 NOTE — Anesthesia Procedure Notes (Signed)
Procedure Name: Intubation Date/Time: 07/08/2021 3:26 PM Performed by: Jonna Munro, CRNA Pre-anesthesia Checklist: Patient identified, Emergency Drugs available, Suction available, Patient being monitored and Timeout performed Patient Re-evaluated:Patient Re-evaluated prior to induction Oxygen Delivery Method: Circle system utilized Preoxygenation: Pre-oxygenation with 100% oxygen Induction Type: IV induction and Rapid sequence Laryngoscope Size: Mac and 3 Grade View: Grade I Tube type: Oral Tube size: 7.5 mm Number of attempts: 1 Airway Equipment and Method: Stylet Placement Confirmation: ETT inserted through vocal cords under direct vision, positive ETCO2 and breath sounds checked- equal and bilateral Secured at: 22 cm Tube secured with: Tape Dental Injury: Teeth and Oropharynx as per pre-operative assessment

## 2021-07-08 NOTE — Progress Notes (Signed)
Pt. Arrived via Bed from PACU. CCMD notified with NSR-80 on monitor. Orders given and carried out. Pt. GCS-15 with Rt. Groin/Lt. Groin assessed.

## 2021-07-08 NOTE — Op Note (Signed)
Procedure: Removal right femoral-popliteal bypass, vein patch angioplasty right common femoral artery using contralateral saphenous vein (left leg)  Preoperative diagnosis: Infected femoropopliteal right leg  Postoperative diagnosis: Same  Anesthesia: General  Assistant: Aggie Moats, PA-C for assistance with exposure expediting procedure creation of anastomosis  Operative findings: 1.  Right common femoral anastomotic pseudoaneurysm  2.  Non-incorporated right femoropopliteal bypass  Specimens: Right femoropopliteal bypass sent for culture  Operative details: After obtaining informed consent, the patient was taken the operating.  The patient was placed in supine position operating table.  Patient's right lower extremity and left groin were prepped and draped in usual sterile fashion.  A longitudinal incision was made through pre-existing scar in the right groin.  This was carried on through subcutaneous tissues down the level of the right common femoral artery.  There were a large number of enlarged lymphatics and inflammation in this area.  There was a pulsatile mass in this area.  I felt that the anastomosis had probably become disrupted.  Therefore I went underneath the inguinal ligament and dissected out the external iliac artery circumferentially at this level.  The artery was soft on palpation.  I then proceeded to start to open the area of the pseudoaneurysm.  However there was ongoing bleeding most likely from the profunda.  Therefore I extended the incision longitudinally further down the thigh and expose the superficial femoral artery through this to gain control.  A vessel loop was placed around this.  I then proceeded to dissect up from the superficial femoral artery to the common femoral artery holding digital pressure.  The common femoral vein was dissected free from the medial wall.  I was never able to completely identify the profunda femoris due to dense scar tissue.  However I was  able to find a reasonable spot on the common femoral artery just above the profunda femoris that was suitable for clamping.  This gave [place] good proximal and distal control.  Patient was given 9000 units of heparin.  He was given an additional 9000 units of heparin during the course of the case.  I then opened the rest of the pseudoaneurysm and debrided away the PTFE material.  There was a large hole in the common femoral artery.  Some infected looking arterial wall was debrided away sharply.  The femoropopliteal bypass was divided.  With gentle traction I pulled out the remainder from the thigh.  This was sent for culture.  Due to the extensive infection in the groin I did not feel a bovine pericardial patch would have good durability.  Therefore a longitudinal incision was made in the left groin carried down through the subcutaneous tissues down the level of left greater saphenous vein.  This was dissected free circumferentially over about 5 cm.  It was then ligated distally with 2-0 silk tie and ligated at the saphenofemoral junction with another 2-0 silk tie.  The vein was transected and opened longitudinally.  It was then sewn onto the right common femoral artery as a patch angioplasty using a running 5-0 Prolene suture.  Just prior to completion of anastomosis it was for blood backbled and thoroughly flushed.  The anastomosis was secured clamps released there was good pulsatile flow in the common femoral artery immediately.  There was good Doppler flow in the proximal superficial femoral artery and what was thought to be the area of the profunda femoris artery as well.  Hemostasis was obtained with administration of 140 mg of protamine Avitene and direct  pressure.  The left groin was closed in multiple layers a running 3-0 Vicryl suture and the skin closed with staples.  The right groin was closed with multiple layers of running 2-0 and 3-0 Vicryl suture and staples in the skin.  Patient tolerated procedure well  and there were no complications.  The instrument sponge and needle count was correct end of the case.  The patient was taken the recovery room in stable condition.  Fabienne Bruns, MD [facility] Office: [phone]

## 2021-07-08 NOTE — Interval H&P Note (Signed)
History and Physical Interval Note:  07/08/2021 3:09 PM  Lonnie [name]  has presented today for surgery, with the diagnosis of INFECTED LEG GRAFT.  The various methods of treatment have been discussed with the patient and family. After consideration of risks, benefits and other options for treatment, the patient has consented to  Procedure(s): EXCISION OF RIGHT LEG PROSTHETIC GRAFT (Right) as a surgical intervention.  The patient's history has been reviewed, patient examined, no change in status, stable for surgery.  I have reviewed the patient's chart and labs.  Questions were answered to the patient's satisfaction.     Fabienne Bruns

## 2021-07-08 NOTE — Anesthesia Preprocedure Evaluation (Signed)
Anesthesia Evaluation  Patient identified by MRN, date of birth, ID band Patient awake    Reviewed: Allergy & Precautions, NPO status , Patient's Chart, lab work & pertinent test results  Airway Mallampati: II  TM Distance: >3 FB Neck ROM: Full    Dental  (+) Teeth Intact, Dental Advisory Given   Pulmonary Current Smoker and Patient abstained from smoking.,    breath sounds clear to auscultation       Cardiovascular hypertension, + CAD, + Past MI, + CABG, + Peripheral Vascular Disease and +CHF   Rhythm:Regular Rate:Normal     Neuro/Psych PSYCHIATRIC DISORDERS Anxiety Depression Dementia CVA, Residual Symptoms    GI/Hepatic negative GI ROS, Neg liver ROS,   Endo/Other  negative endocrine ROS  Renal/GU      Musculoskeletal  (+) Arthritis ,   Abdominal Normal abdominal exam  (+)   Peds  Hematology negative hematology ROS (+)   Anesthesia Other Findings   Reproductive/Obstetrics                            Anesthesia Physical Anesthesia Plan  ASA: 3  Anesthesia Plan: General   Post-op Pain Management:    Induction: Intravenous  PONV Risk Score and Plan: 2 and Ondansetron and Treatment may vary due to age or medical condition  Airway Management Planned: Oral ETT  Additional Equipment: None  Intra-op Plan:   Post-operative Plan: Extubation in OR  Informed Consent: I have reviewed the patients History and Physical, chart, labs and discussed the procedure including the risks, benefits and alternatives for the proposed anesthesia with the patient or authorized representative who has indicated his/her understanding and acceptance.     Dental advisory given and Consent reviewed with POA  Plan Discussed with: CRNA  Anesthesia Plan Comments:         Anesthesia Quick Evaluation

## 2021-07-08 NOTE — Transfer of Care (Signed)
Immediate Anesthesia Transfer of Care Note  Patient: Lonnie [name]  Procedure(s) Performed: REMOVAL RIGHT FEMORAL BYPASS GRAFT (Right: Groin) PATCH ANGIOPLASTY WITH 1X6 XENOSURE PATCH (Right: Groin)  Patient Location: PACU  Anesthesia Type:General  Level of Consciousness: drowsy and patient cooperative  Airway & Oxygen Therapy: Patient Spontanous Breathing and Patient connected to nasal cannula oxygen  Post-op Assessment: Report given to RN and Post -op Vital signs reviewed and stable  Post vital signs: Reviewed and stable  Last Vitals:  Vitals Value Taken Time  BP 109/59 07/08/21 1844  Temp    Pulse 79 07/08/21 1847  Resp 17 07/08/21 1847  SpO2 100 % 07/08/21 1847  Vitals shown include unvalidated device data.  Last Pain:  Vitals:   07/08/21 1501  TempSrc:   PainSc: 0-No pain      Patients Stated Pain Goal: 2 (07/08/21 1042)  Complications: No notable events documented.

## 2021-07-09 ENCOUNTER — Encounter (HOSPITAL_COMMUNITY): Payer: Self-pay | Admitting: Vascular Surgery

## 2021-07-09 ENCOUNTER — Inpatient Hospital Stay: Payer: Self-pay

## 2021-07-09 DIAGNOSIS — T827XXD Infection and inflammatory reaction due to other cardiac and vascular devices, implants and grafts, subsequent encounter: Secondary | ICD-10-CM | POA: Diagnosis not present

## 2021-07-09 DIAGNOSIS — Z89611 Acquired absence of right leg above knee: Secondary | ICD-10-CM | POA: Diagnosis not present

## 2021-07-09 DIAGNOSIS — B965 Pseudomonas (aeruginosa) (mallei) (pseudomallei) as the cause of diseases classified elsewhere: Secondary | ICD-10-CM | POA: Diagnosis not present

## 2021-07-09 DIAGNOSIS — R7881 Bacteremia: Secondary | ICD-10-CM | POA: Diagnosis not present

## 2021-07-09 LAB — CBC WITH DIFFERENTIAL/PLATELET
Abs Immature Granulocytes: 0.8 10*3/uL — ABNORMAL HIGH (ref 0.00–0.07)
Basophils Absolute: 0.1 10*3/uL (ref 0.0–0.1)
Basophils Relative: 0 %
Eosinophils Absolute: 0 10*3/uL (ref 0.0–0.5)
Eosinophils Relative: 0 %
HCT: 27.9 % — ABNORMAL LOW (ref 39.0–52.0)
Hemoglobin: 8.6 g/dL — ABNORMAL LOW (ref 13.0–17.0)
Immature Granulocytes: 4 %
Lymphocytes Relative: 5 %
Lymphs Abs: 1.1 10*3/uL (ref 0.7–4.0)
MCH: 26.5 pg (ref 26.0–34.0)
MCHC: 30.8 g/dL (ref 30.0–36.0)
MCV: 86.1 fL (ref 80.0–100.0)
Monocytes Absolute: 0.9 10*3/uL (ref 0.1–1.0)
Monocytes Relative: 4 %
Neutro Abs: 18.7 10*3/uL — ABNORMAL HIGH (ref 1.7–7.7)
Neutrophils Relative %: 87 %
Platelets: 475 10*3/uL — ABNORMAL HIGH (ref 150–400)
RBC: 3.24 MIL/uL — ABNORMAL LOW (ref 4.22–5.81)
RDW: 21.5 % — ABNORMAL HIGH (ref 11.5–15.5)
WBC: 21.6 10*3/uL — ABNORMAL HIGH (ref 4.0–10.5)
nRBC: 0 % (ref 0.0–0.2)

## 2021-07-09 LAB — COMPREHENSIVE METABOLIC PANEL
ALT: 163 U/L — ABNORMAL HIGH (ref 0–44)
AST: 62 U/L — ABNORMAL HIGH (ref 15–41)
Albumin: 2.1 g/dL — ABNORMAL LOW (ref 3.5–5.0)
Alkaline Phosphatase: 140 U/L — ABNORMAL HIGH (ref 38–126)
Anion gap: 4 — ABNORMAL LOW (ref 5–15)
BUN: 10 mg/dL (ref 8–23)
CO2: 26 mmol/L (ref 22–32)
Calcium: 8.2 mg/dL — ABNORMAL LOW (ref 8.9–10.3)
Chloride: 104 mmol/L (ref 98–111)
Creatinine, Ser: 0.7 mg/dL (ref 0.61–1.24)
GFR, Estimated: >60 mL/min (ref >60)
Glucose, Bld: 154 mg/dL — ABNORMAL HIGH (ref 70–99)
Potassium: 4.7 mmol/L (ref 3.5–5.1)
Sodium: 134 mmol/L — ABNORMAL LOW (ref 135–145)
Total Bilirubin: 0.5 mg/dL (ref 0.3–1.2)
Total Protein: 5.5 g/dL — ABNORMAL LOW (ref 6.5–8.1)

## 2021-07-09 LAB — BASIC METABOLIC PANEL
Anion gap: 4 — ABNORMAL LOW (ref 5–15)
BUN: 9 mg/dL (ref 8–23)
CO2: 28 mmol/L (ref 22–32)
Calcium: 8.4 mg/dL — ABNORMAL LOW (ref 8.9–10.3)
Chloride: 105 mmol/L (ref 98–111)
Creatinine, Ser: 0.69 mg/dL (ref 0.61–1.24)
GFR, Estimated: >60 mL/min (ref >60)
Glucose, Bld: 94 mg/dL (ref 70–99)
Potassium: 3.9 mmol/L (ref 3.5–5.1)
Sodium: 137 mmol/L (ref 135–145)

## 2021-07-09 LAB — CBC
HCT: 26.3 % — ABNORMAL LOW (ref 39.0–52.0)
Hemoglobin: 8.3 g/dL — ABNORMAL LOW (ref 13.0–17.0)
MCH: 26.3 pg (ref 26.0–34.0)
MCHC: 31.6 g/dL (ref 30.0–36.0)
MCV: 83.2 fL (ref 80.0–100.0)
Platelets: 472 10*3/uL — ABNORMAL HIGH (ref 150–400)
RBC: 3.16 MIL/uL — ABNORMAL LOW (ref 4.22–5.81)
RDW: 21.4 % — ABNORMAL HIGH (ref 11.5–15.5)
WBC: 19.6 10*3/uL — ABNORMAL HIGH (ref 4.0–10.5)
nRBC: 0 % (ref 0.0–0.2)

## 2021-07-09 LAB — MAGNESIUM: Magnesium: 2 mg/dL (ref 1.7–2.4)

## 2021-07-09 LAB — PHOSPHORUS: Phosphorus: 3.1 mg/dL (ref 2.5–4.6)

## 2021-07-09 NOTE — Progress Notes (Signed)
PROGRESS NOTE    Lonnie [name]  ZOX:[id number] DOB: 02-Apr-1951 DOA: 06/15/2021 PCP: [name], MD   No chief complaint on file.  Brief Narrative:  70 yo with hx CAD s/p CABG x4 2014, subdural hematoma s/p craniotomy (2020, with residual balance deficits and delayed processing), ischemic cardiomyopathy, right popliteal bypass with PTFE graft 01/23/2020, recent admission 06/07/21 with occluded R fem pop bypass s/p thrombolysis, mechanical thrombectomy of R femoropoliteal bypass graft, stent x2 of the right femoral bypass graft.  He was readmitted 6/25 with weakness and confusion and found to have bladder rentention and possible UTI (multiple species).  His covid testing was positive and he was treated with steroids and remdesivir.  He developed spontaneous bleeding on 7/6 from prior area of R femoropopliteal bypass and then large volume bleeding with hemorrhagic shock on 7/7.  He was taken to the OR by Dr. Chestine Spore on 7/7 for exploration of R below knee popliteal wound, repair of R popliteal vein, ligation of R popliteal artery, and placement of negative pressure wound vac.  He had right AKA on 7/12 with gangrene of R foot.  He developed sepsis on 7/13-14 and was found to have pseudomonas bacteremia related to his infected graft.  He's currently on cefepime.  7/18 s/p removal of infected RLE bypass graft.  ID consulted and recommending 4 weeks of cefepime, then oral cipro.  MRI brain ordered 7/19 due to word finding difficulties.    Assessment & Plan:   Active Problems:   Acute metabolic encephalopathy   AKI (acute kidney injury) (HCC)   Status post above-knee amputation of right lower extremity (HCC)   Altered mental status   Bacteremia due to Pseudomonas   Infected prosthetic vascular graft (HCC)  Acute Metabolic Encephalopathy Developed initially last week with infection He's worsened post op - word finding difficulties and some R>L ataxia today (discussed with daughter, ?if this is similar  to his baseline delirium, she was uncertain)  Follow MRI brain Delirium precautions W/u further as indicated  Sepsis secondary to pseudomonas bacteremia Graft segment sent for culture with moderate pseudomonas 7/12 - sensitive to cefepime UA bland, CXR without acute disease RUQ [place] with mild fatty infiltration of the liver 2/2 blood cultures with pseudomonas, sensitive to cefepime Follow repeat blood cultures NGTDx3  Vascular following appreciate recs - s/p removal right femoral popliteal bypass, vein patch angiopalsty right common femoral artery using contralateral saphenous vein ID c/s, appreciate recs - recommending removal of graft, appreciate additional recs -> plan for PICC tomorrw, cefepime x4 weeks 7/19-8/16 (see dosing per ID note 7/19), ciprofloxaxin after 8/16 for 3-6 months for suppression Echo with EF 50%, RWMA  Elevated Liver Enzymes  Fatty Liver  Likely shock liver, improving  Hepatocellular pattern of liver injury -> continues to improve Negative acute hepatitis panel Minimize meds which could contribute to liver injury RUQ with mild fatty infiltration of liver  Acute Blood Loss Anemia with Hypovolemic Shock Occluded Right Femoropopliteal Bypass s/p lysis complicated by bleeding with disruption and reocclusion of graft S/p right above knee amputation  S/p exploration of R below knee popliteal wound, repair of R popliteal vein, ligation of right popliteal artery and placement of negative pressure wound vac on 7/7 S/p right AKA 7/12 Wound cx with pseudomonas as above - treatment as noted above S/p 4 units pRBC (I see 5 units ordered, only 4 documented as being completed, one as "transfusing" - unclear) Continue aspirin, plavix Gabapentin for phantom pain  Hb relatively stable  Leukocytosis See sepsis  Severe PAD  CAD s/p CABG x4 Aspirin, plavix Not currently on statin, unclear reason, will review (elevated LFT's at this time, follow, consider if  improved) Continue metoprolol F/u with cardiology and vascular outpatient   LUTS  BPH S/p foley on admission Foley now removed Needs urology f/u outpatient Flomax, bethanecol  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 Celexa 20 mg, seroquel 100 mg BID  Systolic  Diastolic HF Appears compensated, follow  COVID 19 Virus infection S/p remdesivir/decadron  DVT prophylaxis: SCD Code Status: dnr Family Communication: daughter, felicia 7/19 Disposition:   Status is: Inpatient  Remains inpatient appropriate because:Inpatient level of care appropriate due to severity of illness  Dispo:  Patient From:    Planned Disposition: Skilled Nursing Facility  Medically stable for discharge:           Consultants:  vascular  Procedures:  LE [place] Summary:  RIGHT:  - There is no evidence of deep vein thrombosis in the lower extremity.     - No cystic structure found in the popliteal fossa.     - Incidental: Extensive fluid surrounding stent at popliteal fossa. Flow  is identified within the stent at the level of the popliteal.   - Ultrasound characteristics of enlarged lymph nodes are noted in the  groin.     LEFT:  - There is no evidence of deep vein thrombosis in the lower extremity.     - No cystic structure found in the popliteal fossa.      Antimicrobials:  Anti-infectives (From admission, onward)    Start     Dose/Rate Route Frequency Ordered Stop   07/04/21 2200  vancomycin (VANCOCIN) IVPB 1000 mg/200 mL premix  Status:  Discontinued        1,000 mg 200 mL/hr over 60 Minutes Intravenous Every 12 hours 07/04/21 0906 07/05/21 0802   07/04/21 1000  vancomycin (VANCOREADY) IVPB 1500 mg/300 mL        1,500 mg 150 mL/hr over 120 Minutes Intravenous  Once 07/04/21 0906 07/04/21 1348   07/04/21 1000  ceFEPIme (MAXIPIME) 2 g in sodium chloride 0.9 % 100 mL IVPB        2 g 200 mL/hr over 30 Minutes Intravenous Every 8 hours 07/04/21  0906     07/02/21 2200  ceFAZolin (ANCEF) IVPB 1 g/50 mL premix  Status:  Discontinued        1 g 100 mL/hr over 30 Minutes Intravenous Every 8 hours 07/02/21 1816 07/04/21 0822   06/17/21 1400  ceFEPIme (MAXIPIME) 2 g in sodium chloride 0.9 % 100 mL IVPB        2 g 200 mL/hr over 30 Minutes Intravenous Every 12 hours 06/17/21 1258 06/19/21 0955   06/17/21 1000  remdesivir 100 mg in sodium chloride 0.9 % 100 mL IVPB       See Hyperspace for full Linked Orders Report.   100 mg 200 mL/hr over 30 Minutes Intravenous Daily 06/16/21 0135 06/18/21 1026   06/16/21 2200  cefTRIAXone (ROCEPHIN) 2 g in sodium chloride 0.9 % 100 mL IVPB  Status:  Discontinued        2 g 200 mL/hr over 30 Minutes Intravenous Every 24 hours 06/15/21 2304 06/17/21 1218   06/16/21 2200  vancomycin (VANCOREADY) IVPB 1000 mg/200 mL  Status:  Discontinued        1,000 mg 200 mL/hr over 60 Minutes Intravenous Every 12 hours 06/16/21 0820 06/17/21 1218   06/16/21 0915  vancomycin (VANCOREADY) IVPB 1750 mg/350 mL        1,750 mg 175 mL/hr over 120 Minutes Intravenous  Once 06/16/21 0820 06/16/21 1114   06/16/21 0230  remdesivir 200 mg in sodium chloride 0.9% 250 mL IVPB       See Hyperspace for full Linked Orders Report.   200 mg 580 mL/hr over 30 Minutes Intravenous Once 06/16/21 0135 06/16/21 0324   06/15/21 2230  linezolid (ZYVOX) IVPB 600 mg  Status:  Discontinued        600 mg 300 mL/hr over 60 Minutes Intravenous Every 12 hours 06/15/21 2228 06/16/21 0811   06/15/21 2115  cefTRIAXone (ROCEPHIN) 2 g in sodium chloride 0.9 % 100 mL IVPB        2 g 200 mL/hr over 30 Minutes Intravenous  Once 06/15/21 2114 06/15/21 2328          Subjective: Confused, word finding diffuclties  Objective: Vitals:   07/08/21 2300 07/09/21 0600 07/09/21 0739 07/09/21 1139  BP: 140/77  (!) 129/56 (!) 120/56  Pulse: 72  65 62  Resp: 18  17 14   Temp: 98.6 F (37 C) 98.2 F (36.8 C) 98.8 F (37.1 C) 98.5 F (36.9 C)  TempSrc:  Oral Oral Oral Oral  SpO2: 98%  98% 100%  Weight:      Height:        Intake/Output Summary (Last 24 hours) at 07/09/2021 1345 Last data filed at 07/09/2021 0943 Gross per 24 hour  Intake 2751.45 ml  Output 2600 ml  Net 151.45 ml   Filed Weights   07/04/21 0345 07/07/21 0526 07/08/21 0435  Weight: 72.1 kg 74.6 kg 76.6 kg    Examination:  General: No acute distress. Cardiovascular: Heart sounds show Mable Dara regular rate, and rhythm. Lungs: Clear to auscultation bilaterally Abdomen: Soft, nontender, nondistended  Neurological: Alert and oriented 2. Symmetric strength to upper/lower extremities.  R>L upper extremity ataxia with FNF, word finding difficulties. Cranial nerves II through XII grossly intact. Skin: Warm and dry. No rashes or lesions. Extremities: R AKA well apparing       Data Reviewed: I have personally reviewed following labs and imaging studies  CBC: Recent Labs  Lab 07/05/21 0143 07/05/21 1637 07/06/21 0545 07/07/21 0056 07/08/21 0519 07/09/21 0044 07/09/21 1111  WBC 26.7*  --  20.7* 18.8* 15.4* 21.6* 19.6*  NEUTROABS 23.1*  --  17.5* 14.9* 11.6* 18.7*  --   HGB 6.7*   < > 9.4* 9.0* 9.6* 8.6* 8.3*  HCT 21.4*   < > 29.2* 28.3* 30.3* 27.9* 26.3*  MCV 81.1  --  82.0 81.8 83.5 86.1 83.2  PLT 354  --  334 360 453* 475* 472*   < > = values in this interval not displayed.    Basic Metabolic Panel: Recent Labs  Lab 07/05/21 0143 07/06/21 0545 07/07/21 0056 07/08/21 0519 07/09/21 0044 07/09/21 1111  NA 135 136 136 136 134* 137  K 3.7 3.9 4.3 4.0 4.7 3.9  CL 104 106 108 106 104 105  CO2 25 24 23 22 26 28   GLUCOSE 133* 105* 108* 113* 154* 94  BUN 13 15 12 8 10 9   CREATININE 0.90 0.77 0.65 0.59* 0.70 0.69  CALCIUM 8.6* 8.3* 8.0* 8.4* 8.2* 8.4*  MG 2.1 2.0 2.0 1.9 2.0  --   PHOS 3.7 2.4* 2.4* 2.9 3.1  --     GFR: Estimated Creatinine Clearance: 95.8 mL/min (by C-G formula based on SCr of 0.69 mg/dL).  Liver Function  Tests: Recent Labs  Lab  07/05/21 0143 07/06/21 0545 07/07/21 0056 07/08/21 0519 07/09/21 0044  AST 629* 147* 90* 51* 62*  ALT 771* 391* 285* 213* 163*  ALKPHOS 323* 235* 189* 164* 140*  BILITOT 1.7* 1.3* 0.9 0.7 0.5  PROT 5.8* 5.5* 5.5* 6.0* 5.5*  ALBUMIN 2.0* 1.9* 1.9* 2.0* 2.1*    CBG: Recent Labs  Lab 07/04/21 1721  GLUCAP 128*     Recent Results (from the past 240 hour(s))  Aerobic Culture w Gram Stain (superficial specimen)     Status: None   Collection Time: 07/02/21  4:13 PM   Specimen: PATH Other; Tissue  Result Value Ref Range Status   Specimen Description WOUND RIGHT LEG  Final   Special Requests BYPASS  GRAFT PT ON ANCEF  Final   Gram Stain   Final    MODERATE WBC PRESENT,BOTH PMN AND MONONUCLEAR NO ORGANISMS SEEN Performed at [facility], [address] [place], [place] [address]    Culture MODERATE PSEUDOMONAS AERUGINOSA  Final   Report Status 07/05/2021 FINAL  Final   Organism ID, Bacteria PSEUDOMONAS AERUGINOSA  Final      Susceptibility   Pseudomonas aeruginosa - MIC*    CEFTAZIDIME 2 SENSITIVE Sensitive     CIPROFLOXACIN <=0.25 SENSITIVE Sensitive     GENTAMICIN <=1 SENSITIVE Sensitive     IMIPENEM 1 SENSITIVE Sensitive     PIP/TAZO <=4 SENSITIVE Sensitive     CEFEPIME 2 SENSITIVE Sensitive     * MODERATE PSEUDOMONAS AERUGINOSA  Culture, blood (routine x 2)     Status: Abnormal   Collection Time: 07/04/21  8:49 AM   Specimen: BLOOD LEFT ARM  Result Value Ref Range Status   Specimen Description BLOOD LEFT ARM  Final   Special Requests   Final    BOTTLES DRAWN AEROBIC ONLY Blood Culture adequate volume   Culture  Setup Time   Final    GRAM NEGATIVE RODS AEROBIC BOTTLE ONLY CRITICAL VALUE NOTED.  VALUE IS CONSISTENT WITH PREVIOUSLY REPORTED AND CALLED VALUE.    Culture (Shaquitta Burbridge)  Final    PSEUDOMONAS AERUGINOSA SUSCEPTIBILITIES PERFORMED ON PREVIOUS CULTURE WITHIN THE LAST 5 DAYS. Performed at [facility], [address], [place], [place] [address]     Report Status 07/08/2021 FINAL  Final  Culture, blood (routine x 2)     Status: Abnormal   Collection Time: 07/04/21  8:53 AM   Specimen: BLOOD RIGHT HAND  Result Value Ref Range Status   Specimen Description BLOOD RIGHT HAND  Final   Special Requests   Final    BOTTLES DRAWN AEROBIC ONLY Blood Culture results may not be optimal due to an inadequate volume of blood received in culture bottles   Culture  Setup Time   Final    GRAM NEGATIVE RODS AEROBIC BOTTLE ONLY CRITICAL RESULT CALLED TO, READ BACK BY AND VERIFIED WITH: Dorthy Cooler [id number] 07/05/2021 Girtha Hake Performed at [facility], [address], [place], [place] [address]    Culture PSEUDOMONAS AERUGINOSA (Rawley Harju)  Final   Report Status 07/08/2021 FINAL  Final   Organism ID, Bacteria PSEUDOMONAS AERUGINOSA  Final      Susceptibility   Pseudomonas aeruginosa - MIC*    CEFTAZIDIME 4 SENSITIVE Sensitive     CIPROFLOXACIN <=0.25 SENSITIVE Sensitive     GENTAMICIN <=1 SENSITIVE Sensitive     IMIPENEM 1 SENSITIVE Sensitive     PIP/TAZO <=4 SENSITIVE Sensitive     CEFEPIME 2  SENSITIVE Sensitive     * PSEUDOMONAS AERUGINOSA  Blood Culture ID Panel (Reflexed)     Status: Abnormal   Collection Time: 07/04/21  8:53 AM  Result Value Ref Range Status   Enterococcus faecalis NOT DETECTED NOT DETECTED Final   Enterococcus Faecium NOT DETECTED NOT DETECTED Final   Listeria monocytogenes NOT DETECTED NOT DETECTED Final   Staphylococcus species NOT DETECTED NOT DETECTED Final   Staphylococcus aureus (BCID) NOT DETECTED NOT DETECTED Final   Staphylococcus epidermidis NOT DETECTED NOT DETECTED Final   Staphylococcus lugdunensis NOT DETECTED NOT DETECTED Final   Streptococcus species NOT DETECTED NOT DETECTED Final   Streptococcus agalactiae NOT DETECTED NOT DETECTED Final   Streptococcus pneumoniae NOT DETECTED NOT DETECTED Final   Streptococcus pyogenes NOT DETECTED NOT DETECTED Final   Kaula Klenke.calcoaceticus-baumannii NOT DETECTED NOT  DETECTED Final   Bacteroides fragilis NOT DETECTED NOT DETECTED Final   Enterobacterales NOT DETECTED NOT DETECTED Final   Enterobacter cloacae complex NOT DETECTED NOT DETECTED Final   Escherichia coli NOT DETECTED NOT DETECTED Final   Klebsiella aerogenes NOT DETECTED NOT DETECTED Final   Klebsiella oxytoca NOT DETECTED NOT DETECTED Final   Klebsiella pneumoniae NOT DETECTED NOT DETECTED Final   Proteus species NOT DETECTED NOT DETECTED Final   Salmonella species NOT DETECTED NOT DETECTED Final   Serratia marcescens NOT DETECTED NOT DETECTED Final   Haemophilus influenzae NOT DETECTED NOT DETECTED Final   Neisseria meningitidis NOT DETECTED NOT DETECTED Final   Pseudomonas aeruginosa DETECTED (Alrick Cubbage) NOT DETECTED Final    Comment: CRITICAL RESULT CALLED TO, READ BACK BY AND VERIFIED WITH: J. LEDFORD,PHARMD 1610 07/05/2021 T. TYSOR    Stenotrophomonas maltophilia NOT DETECTED NOT DETECTED Final   Candida albicans NOT DETECTED NOT DETECTED Final   Candida auris NOT DETECTED NOT DETECTED Final   Candida glabrata NOT DETECTED NOT DETECTED Final   Candida krusei NOT DETECTED NOT DETECTED Final   Candida parapsilosis NOT DETECTED NOT DETECTED Final   Candida tropicalis NOT DETECTED NOT DETECTED Final   Cryptococcus neoformans/gattii NOT DETECTED NOT DETECTED Final   CTX-M ESBL NOT DETECTED NOT DETECTED Final   Carbapenem resistance IMP NOT DETECTED NOT DETECTED Final   Carbapenem resistance KPC NOT DETECTED NOT DETECTED Final   Carbapenem resistance NDM NOT DETECTED NOT DETECTED Final   Carbapenem resistance VIM NOT DETECTED NOT DETECTED Final    Comment: Performed at [facility], [address], [place], [place] [address]  Urine Culture     Status: None   Collection Time: 07/04/21 11:25 PM   Specimen: Urine, Catheterized  Result Value Ref Range Status   Specimen Description URINE, CATHETERIZED  Final   Special Requests NONE  Final   Culture   Final    NO GROWTH Performed at  [facility], [address] [place], [place] [address]    Report Status 07/06/2021 FINAL  Final  Culture, blood (routine x 2)     Status: None (Preliminary result)   Collection Time: 07/05/21  4:37 PM   Specimen: BLOOD LEFT HAND  Result Value Ref Range Status   Specimen Description BLOOD LEFT HAND  Final   Special Requests   Final    BOTTLES DRAWN AEROBIC ONLY Blood Culture adequate volume   Culture   Final    NO GROWTH 4 DAYS Performed at [facility], [address], [place], [place] [address]    Report Status PENDING  Incomplete  Culture, blood (routine x 2)  Status: None (Preliminary result)   Collection Time: 07/05/21  4:37 PM   Specimen: BLOOD  Result Value Ref Range Status   Specimen Description BLOOD LEFT ANTECUBITAL  Final   Special Requests   Final    BOTTLES DRAWN AEROBIC ONLY Blood Culture results may not be optimal due to an inadequate volume of blood received in culture bottles   Culture   Final    NO GROWTH 4 DAYS Performed at [facility], [address], [place], [place] [address]    Report Status PENDING  Incomplete  Anaerobic culture w Gram Stain     Status: None (Preliminary result)   Collection Time: 07/08/21  6:09 PM   Specimen: Hemodialysis Graft; Other  Result Value Ref Range Status   Specimen Description HEMODIALYSIS GRAFT  Final   Special Requests RT FEM POP GRAFT  Final   Gram Stain   Final    ABUNDANT WBC PRESENT, PREDOMINANTLY PMN NO ORGANISMS SEEN Performed at [facility], [address], [place], [place] [address]    Culture PENDING  Incomplete   Report Status PENDING  Incomplete  Aerobic Culture w Gram Stain (superficial specimen)     Status: None (Preliminary result)   Collection Time: 07/08/21  6:09 PM   Specimen: Hemodialysis Graft  Result Value Ref Range Status   Specimen Description HEMODIALYSIS GRAFT  Final   Special Requests RT FEM POP GRAFT  Final   Gram Stain PENDING  Incomplete   Culture   Final     CULTURE REINCUBATED FOR BETTER GROWTH Performed at [facility], [address], [place], [place] [address]    Report Status PENDING  Incomplete         Radiology Studies: No results found.      Scheduled Meds:  sodium chloride   Intravenous Once   sodium chloride   Intravenous Once   aspirin EC  81 mg Oral Daily   bethanechol  10 mg Oral TID   Chlorhexidine Gluconate Cloth  6 each Topical Daily   citalopram  20 mg Oral Daily   clopidogrel  75 mg Oral Daily   diphenhydrAMINE  25 mg Oral Once   ferrous fumarate-b12-vitamic C-folic acid  1 capsule Oral Daily   gabapentin  100 mg Oral Daily   gabapentin  300 mg Oral QHS   melatonin  10 mg Oral QHS   metoprolol tartrate  12.5 mg Oral BID   multivitamin with minerals  1 tablet Oral Daily   nicotine  7 mg Transdermal Daily   pantoprazole  40 mg Oral QPM   QUEtiapine  100 mg Oral BID   senna-docusate  1 tablet Oral BID   sodium chloride flush  10-40 mL Intracatheter Q12H   tamsulosin  0.8 mg Oral QPM   Continuous Infusions:  ceFEPime (MAXIPIME) IV 2 g (07/09/21 0506)   lactated ringers 125 mL/hr at 07/09/21 1154     LOS: 23 days    Time spent: over 30 min    Lacretia Nicks, MD [facility]   To contact the attending provider between 7A-7P or the covering provider during after hours 7P-7A, please log into the web site [email or web address] and access using universal [place] password for that web site. If you do not have the password, please call the hospital operator.  07/09/2021, 1:45 PM

## 2021-07-09 NOTE — Progress Notes (Addendum)
Physical Therapy Treatment Patient Details Name: Lonnie [name] MRN: [id number] DOB: 08/21/1951 Today's Date: 07/09/2021    History of Present Illness Lonnie [name] is a 70 y.o. male who underwent peripheral vascular thrombectomy of R LE and graft placement 6/17 and discharged to home. Pt readmitted 6/25 due to AMS, AKI, metabolic encephalopathy. He was found to be COVID+. He underwent exploration of R below knee popliteal wound and noted with acute ischemic wounds and gangrene R foot as well as graft infection. 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 Patient s/p complete graft removal 7/18. There was a vein patch with prolene suture PMH: dementia, PAD, tobacco use disorder, chronic depression/anxiety, impaired vision, iron deficiency anemia, peripheral neuropathy, essential HTN, HLD, GERD, BPH, falls, SDH s/p crani (2020).    PT Comments    Pt received in supine, agreeable to therapy session and with fair tolerance for transfer training. Pt with increased frustration during standing/pivot transfer tasks needing +2 maxA to safely reach chair. Pt with noted difficulty word finding after exertion, likely increased RLE discomfort with standing contributing to frustration. Pt with slow processing needing increased time/cues for safe techniques and perseverating on plan to go home. Pt continues to benefit from PT services to progress toward functional mobility goals.   Follow Up Recommendations  SNF;Supervision/Assistance - 24 hour     Equipment Recommendations  Other (comment);None recommended by PT (defer to post-acute location)    Recommendations for Other Services       Precautions / Restrictions Precautions Precautions: Fall Precaution Comments: R AKA Restrictions Weight Bearing Restrictions: Yes RLE Weight Bearing: Non weight bearing    Mobility  Bed Mobility Overal bed mobility: Needs Assistance Bed Mobility: Supine to Sit      Supine to sit: Supervision     General bed mobility comments: no assist needed, able to perform with flat HOB no rails with increased time and cues to scoot hips forward    Transfers Overall transfer level: Needs assistance Equipment used: Rolling walker (2 wheeled) Transfers: Sit to/from Stand Sit to Stand: Min assist;+2 safety/equipment Stand pivot transfers: Max assist;+2 physical assistance       General transfer comment: Pt +31minA to stand from EOB initially to RW. Pt impulsive to sit toward end of stand pivot with RW needing +2 maxA for safe guidance into chair. Pt attempted to stand from chair height>RW but once mostly upright with +2 modA, pt impulsively returns to sitting, unclear whether due to RLE pain or fatigue.  Ambulation/Gait   General Gait Details: Attempted to have pt hop in place but pt unable to clear LLE, instead pivoted to chair          Balance Overall balance assessment: Needs assistance Sitting-balance support: Feet supported Sitting balance-Leahy Scale: Fair Sitting balance - Comments: static sitting EOB no LOB Postural control: Posterior lean Standing balance support: Bilateral upper extremity supported;During functional activity Standing balance-Leahy Scale: Poor (poor to zero) Standing balance comment: reliant on BUE support due to R AKA + external support, impulsive to sit during pivot transfer and unable to hop so deferred further gait trials                            Cognition Arousal/Alertness: Awake/alert Behavior During Therapy: WFL for tasks assessed/performed Overall Cognitive Status: History of cognitive impairments - at baseline Area of Impairment: Safety/judgement;Following commands;Problem solving;Memory;Attention  Current Attention Level: Sustained Memory: Decreased short-term memory Following Commands: Follows one step commands with increased time;Follows multi-step commands  inconsistently Safety/Judgement: Decreased awareness of safety;Decreased awareness of deficits Awareness: Emergent Problem Solving: Slow processing;Difficulty sequencing;Requires verbal cues;Requires tactile cues General Comments: Hx of dementia, increased time for following directions, safety and awareness (per chart review, delayed processing post-SDH and craniotomy in 2020). Pt with increased word-finding difficulty during session and increased frustration with mobility challenges. While pt is very pleasant with sitting, once up in weightbearing he becomes more agitated and impulsive, requiring increased multimodal cuing for safety.      Exercises Other Exercises Other Exercises: standing LLE AROM: heel raises x5 reps Other Exercises: supine BUE AROM: finger flex/ext, wrist flex/ext, elbow flex/ext, shoulder abduction (with elbows flexed) x10 reps ea Other Exercises: STS x 2 attempts (1 successful) Other Exercises: cues for chair push-ups but pt not receptive to attempt    General Comments General comments (skin integrity, edema, etc.): VSS on RA, pt with noted word finding difficulty, MD in room and aware, RN notified.      Pertinent Vitals/Pain Pain Assessment: Faces Faces Pain Scale: Hurts little more Pain Location: R residual limb Pain Descriptors / Indicators: Grimacing;Guarding Pain Intervention(s): Monitored during session;Limited activity within patient's tolerance;Premedicated before session;Repositioned     PT Goals (current goals can now be found in the care plan section) Acute Rehab PT Goals Patient Stated Goal: go home PT Goal Formulation: With patient Time For Goal Achievement: 07/17/21 Progress towards PT goals: Progressing toward goals    Frequency    Min 2X/week      PT Plan Current plan remains appropriate    Co-evaluation PT/OT/SLP Co-Evaluation/Treatment: Yes Reason for Co-Treatment: Complexity of the patient's impairments (multi-system  involvement);Necessary to address cognition/behavior during functional activity;For patient/therapist safety;To address functional/ADL transfers PT goals addressed during session: Mobility/safety with mobility;Balance;Proper use of DME;Strengthening/ROM        AM-PAC PT "6 Clicks" Mobility   Outcome Measure  Help needed turning from your back to your side while in a flat bed without using bedrails?: None Help needed moving from lying on your back to sitting on the side of a flat bed without using bedrails?: A Little Help needed moving to and from a bed to a chair (including a wheelchair)?: A Lot Help needed standing up from a chair using your arms (e.g., wheelchair or bedside chair)?: A Lot Help needed to walk in hospital room?: Total Help needed climbing 3-5 steps with a railing? : Total 6 Click Score: 13    End of Session Equipment Utilized During Treatment: Gait belt Activity Tolerance: Other (comment) (unclear; likely RLE pain and fatigue since sx yesterday, pt with some word finding difficulty/frustration and unable to indicate clearly) Patient left: in chair;with call bell/phone within reach;with chair alarm set;Other (comment) (MD x2 entering room) Nurse Communication: Mobility status;Need for lift equipment;Other (comment) (use Stedy or seated scoot from drop arm chair to transfer back to bed; slow processing/word finding difficulty) PT Visit Diagnosis: Other abnormalities of gait and mobility (R26.89);Muscle weakness (generalized) (M62.81);Pain;Difficulty in walking, not elsewhere classified (R26.2);Unsteadiness on feet (R26.81) Pain - Right/Left: Right Pain - part of body: Leg     Time: 2025-4270 PT Time Calculation (min) (ACUTE ONLY): 23 min  Charges:  $Therapeutic Exercise: 8-22 mins                     Iliya Spivack P., PTA Acute Rehabilitation Services Pager: [phone] Office: [phone]    Dorathy Kinsman  Chanika Byland 07/09/2021, 1:52 PM

## 2021-07-09 NOTE — Progress Notes (Addendum)
[facility]  Date of Admission:  06/15/2021     CC: Pseudomonas bsi, vascular graft infection  Lines: peripheral  Abx: 7/14-c cefepime  ASSESSMENT: 70 yo with hx CAD s/p CABG x4 2014, subdural hematoma s/p craniotomy (2020, with residual balance deficits and delayed processing), ischemic cardiomyopathy, right popliteal bypass with PTFE graft 01/23/2020, recent admission 06/07/21 with occluded R fem pop bypass s/p thrombolysis, mechanical thrombectomy of R femoropoliteal bypass graft, stent x2 of the right femoral bypass graft, readmitted 6/25 for confusion/weakness found to have mild covid infection s/p remdesivir-steroid and multiple bacterial species in urine s/p 5 day abx, course complicated by hemorrhage blood loss from right fem-pop surgical site s/p exploration 7/07, right foot gangrene s/p aka 7/12 during which sign of infection around the graft was found (cx PsA), and subsequently pseudomonas bsi on 7/14.  7/12 operative wound cx RLE psA 7/14 bcx psA (S cipro, cefepime) 7/15 bcx negative  Suspect graft infection after 6/17 admission with AKA finding and wound cx soft tissue PsA  7/19 assessment Patient s/p complete graft removal 7/18. I reviewed op-note and discussed with vascular surgery. There was a vein patch with prolene suture Patient clinically doing well     PLAN: Continue cefepime; plan duration 4 weeks from 7/19 until 8/16 Cefepime can be dosed 2 gram q8hrs for 2 weeks from 7/19 until 8/02, then 2gram q12hours for the rest of the duration On 8/16 would transition to ciprofloxacin for at least 3-6 months oral suppression Picc can be placed tomorrow Id clinic f/u as below Will sign off Discussed with primary team   OPAT Orders Discharge antibiotics to be given via PICC line  [place] Per Protocol:  Home health RN for IV administration and teaching; PICC line care and labs.    Labs weekly while on IV antibiotics: _x_ CBC  with differential __ BMP _x_ CMP _x_ CRP __ ESR __ Vancomycin trough __ CK  __ Please pull PIC at completion of IV antibiotics __ Please leave PIC in place until doctor has seen patient or been notified  Fax weekly labs to [phone]  Clinic Follow Up Appt: 8/02 @ 330 with dr Gale Journey  @  [facility] [address], [place], [place] [address] Phone: [phone]  I spent more than 35 minute reviewing data/chart, and coordinating care and >50% direct face to face time providing counseling/discussing diagnostics/treatment plan with patient   Active Problems:   Acute metabolic encephalopathy   AKI (acute kidney injury) ([place])   Status post above-knee amputation of right lower extremity (HCC)   Altered mental status   Bacteremia due to Pseudomonas   Infected prosthetic vascular graft (HCC)   Allergies  Allergen Reactions   Ace Inhibitors Swelling and Other (See Comments)    Angioedema   Eggs Or Egg-Derived Products Nausea Only and Other (See Comments)    Cannot eat prepared eggs- Occasional nausea   Lactose Intolerance (Gi) Diarrhea, Nausea Only and Other (See Comments)    Flatulence, also   Latex Itching    Scheduled Meds:  sodium chloride   Intravenous Once   sodium chloride   Intravenous Once   aspirin EC  81 mg Oral Daily   bethanechol  10 mg Oral TID   Chlorhexidine Gluconate Cloth  6 each Topical Daily   citalopram  20 mg Oral Daily   clopidogrel  75 mg Oral Daily   diphenhydrAMINE  25 mg Oral Once  ferrous [id number] C-folic acid  1 capsule Oral Daily   gabapentin  100 mg Oral Daily   gabapentin  300 mg Oral QHS   melatonin  10 mg Oral QHS   metoprolol tartrate  12.5 mg Oral BID   multivitamin with minerals  1 tablet Oral Daily   nicotine  7 mg Transdermal Daily   pantoprazole  40 mg Oral QPM   QUEtiapine  100 mg Oral BID   senna-docusate  1 tablet Oral BID   sodium chloride flush  10-40 mL Intracatheter Q12H   tamsulosin  0.8 mg Oral  QPM   Continuous Infusions:  ceFEPime (MAXIPIME) IV 2 g (07/09/21 0506)   lactated ringers 125 mL/hr at 07/09/21 1154   PRN Meds:.albuterol, clonazePAM, guaiFENesin-dextromethorphan, HYDROmorphone (DILAUDID) injection, ondansetron, oxyCODONE, polyethylene glycol, prochlorperazine, sodium chloride flush, traMADol   Subjective: No complaint today Bilateral groin assess for graft removal without issue Right aka stump without issue No n/v/rash  Reviewed opnote and spoke with vasc surgery team--all old graft removed  Review of Systems: ROS All other ROS was negative, except mentioned above     OBJECTIVE: Vitals:   07/08/21 2300 07/09/21 0600 07/09/21 0739 07/09/21 1139  BP: 140/77  (!) 129/56 (!) 120/56  Pulse: 72  65 62  Resp: '18  17 14  ' Temp: 98.6 F (37 C) 98.2 F (36.8 C) 98.8 F (37.1 C) 98.5 F (36.9 C)  TempSrc: Oral Oral Oral Oral  SpO2: 98%  98% 100%  Weight:      Height:       Body mass index is 21.68 kg/m.  Physical Exam General/constitutional: no distress, pleasant HEENT: Normocephalic, PER, Conj Clear, EOMI, Oropharynx clear Neck supple CV: rrr no mrg Lungs: clear to auscultation, normal respiratory effort Abd: Soft, Nontender Ext: no edema Skin: No Rash Neuro: nonfocal MSK: right aka stump incision healed; staples intact; right/left groin incision no hematoma   Lab Results Lab Results  Component Value Date   WBC 19.6 (H) 07/09/2021   HGB 8.3 (L) 07/09/2021   HCT 26.3 (L) 07/09/2021   MCV 83.2 07/09/2021   PLT 472 (H) 07/09/2021    Lab Results  Component Value Date   CREATININE 0.69 07/09/2021   BUN 9 07/09/2021   NA 137 07/09/2021   K 3.9 07/09/2021   CL 105 07/09/2021   CO2 28 07/09/2021    Lab Results  Component Value Date   ALT 163 (H) 07/09/2021   AST 62 (H) 07/09/2021   ALKPHOS 140 (H) 07/09/2021   BILITOT 0.5 07/09/2021      Microbiology: Recent Results (from the past 240 hour(s))  Aerobic Culture w Gram Stain  (superficial specimen)     Status: None   Collection Time: 07/02/21  4:13 PM   Specimen: PATH Other; Tissue  Result Value Ref Range Status   Specimen Description WOUND RIGHT LEG  Final   Special Requests BYPASS  GRAFT PT ON ANCEF  Final   Gram Stain   Final    MODERATE WBC PRESENT,BOTH PMN AND MONONUCLEAR NO ORGANISMS SEEN Performed at [facility], [address], [place], [place] [address]    Culture MODERATE PSEUDOMONAS AERUGINOSA  Final   Report Status 07/05/2021 FINAL  Final   Organism ID, Bacteria PSEUDOMONAS AERUGINOSA  Final      Susceptibility   Pseudomonas aeruginosa - MIC*    CEFTAZIDIME 2 SENSITIVE Sensitive     CIPROFLOXACIN <=0.25 SENSITIVE Sensitive     GENTAMICIN <=1 SENSITIVE Sensitive  IMIPENEM 1 SENSITIVE Sensitive     PIP/TAZO <=4 SENSITIVE Sensitive     CEFEPIME 2 SENSITIVE Sensitive     * MODERATE PSEUDOMONAS AERUGINOSA  Culture, blood (routine x 2)     Status: Abnormal   Collection Time: 07/04/21  8:49 AM   Specimen: BLOOD LEFT ARM  Result Value Ref Range Status   Specimen Description BLOOD LEFT ARM  Final   Special Requests   Final    BOTTLES DRAWN AEROBIC ONLY Blood Culture adequate volume   Culture  Setup Time   Final    GRAM NEGATIVE RODS AEROBIC BOTTLE ONLY CRITICAL VALUE NOTED.  VALUE IS CONSISTENT WITH PREVIOUSLY REPORTED AND CALLED VALUE.    Culture (A)  Final    PSEUDOMONAS AERUGINOSA SUSCEPTIBILITIES PERFORMED ON PREVIOUS CULTURE WITHIN THE LAST 5 DAYS. Performed at [facility], [place] [address], [place], [place] [address]    Report Status 07/08/2021 FINAL  Final  Culture, blood (routine x 2)     Status: Abnormal   Collection Time: 07/04/21  8:53 AM   Specimen: BLOOD RIGHT HAND  Result Value Ref Range Status   Specimen Description BLOOD RIGHT HAND  Final   Special Requests   Final    BOTTLES DRAWN AEROBIC ONLY Blood Culture results may not be optimal due to an inadequate volume of blood received in culture bottles    Culture  Setup Time   Final    GRAM NEGATIVE RODS AEROBIC BOTTLE ONLY CRITICAL RESULT CALLED TO, READ BACK BY AND VERIFIED WITH: Serita Grammes [id number] 07/05/2021 Mena Goes Performed at [facility], [place] [address] [place], [place] [address]    Culture PSEUDOMONAS AERUGINOSA (A)  Final   Report Status 07/08/2021 FINAL  Final   Organism ID, Bacteria PSEUDOMONAS AERUGINOSA  Final      Susceptibility   Pseudomonas aeruginosa - MIC*    CEFTAZIDIME 4 SENSITIVE Sensitive     CIPROFLOXACIN <=0.25 SENSITIVE Sensitive     GENTAMICIN <=1 SENSITIVE Sensitive     IMIPENEM 1 SENSITIVE Sensitive     PIP/TAZO <=4 SENSITIVE Sensitive     CEFEPIME 2 SENSITIVE Sensitive     * PSEUDOMONAS AERUGINOSA  Blood Culture ID Panel (Reflexed)     Status: Abnormal   Collection Time: 07/04/21  8:53 AM  Result Value Ref Range Status   Enterococcus faecalis NOT DETECTED NOT DETECTED Final   Enterococcus Faecium NOT DETECTED NOT DETECTED Final   Listeria monocytogenes NOT DETECTED NOT DETECTED Final   Staphylococcus species NOT DETECTED NOT DETECTED Final   Staphylococcus aureus (BCID) NOT DETECTED NOT DETECTED Final   Staphylococcus epidermidis NOT DETECTED NOT DETECTED Final   Staphylococcus lugdunensis NOT DETECTED NOT DETECTED Final   Streptococcus species NOT DETECTED NOT DETECTED Final   Streptococcus agalactiae NOT DETECTED NOT DETECTED Final   Streptococcus pneumoniae NOT DETECTED NOT DETECTED Final   Streptococcus pyogenes NOT DETECTED NOT DETECTED Final   A.calcoaceticus-baumannii NOT DETECTED NOT DETECTED Final   Bacteroides fragilis NOT DETECTED NOT DETECTED Final   Enterobacterales NOT DETECTED NOT DETECTED Final   Enterobacter cloacae complex NOT DETECTED NOT DETECTED Final   Escherichia coli NOT DETECTED NOT DETECTED Final   Klebsiella aerogenes NOT DETECTED NOT DETECTED Final   Klebsiella oxytoca NOT DETECTED NOT DETECTED Final   Klebsiella pneumoniae NOT DETECTED NOT DETECTED Final    Proteus species NOT DETECTED NOT DETECTED Final   Salmonella species NOT DETECTED NOT DETECTED Final   Serratia marcescens NOT DETECTED NOT DETECTED Final  Haemophilus influenzae NOT DETECTED NOT DETECTED Final   Neisseria meningitidis NOT DETECTED NOT DETECTED Final   Pseudomonas aeruginosa DETECTED (A) NOT DETECTED Final    Comment: CRITICAL RESULT CALLED TO, READ BACK BY AND VERIFIED WITH: J. LEDFORD,PHARMD [id number] 07/05/2021 T. TYSOR    Stenotrophomonas maltophilia NOT DETECTED NOT DETECTED Final   Candida albicans NOT DETECTED NOT DETECTED Final   Candida auris NOT DETECTED NOT DETECTED Final   Candida glabrata NOT DETECTED NOT DETECTED Final   Candida krusei NOT DETECTED NOT DETECTED Final   Candida parapsilosis NOT DETECTED NOT DETECTED Final   Candida tropicalis NOT DETECTED NOT DETECTED Final   Cryptococcus neoformans/gattii NOT DETECTED NOT DETECTED Final   CTX-M ESBL NOT DETECTED NOT DETECTED Final   Carbapenem resistance IMP NOT DETECTED NOT DETECTED Final   Carbapenem resistance KPC NOT DETECTED NOT DETECTED Final   Carbapenem resistance NDM NOT DETECTED NOT DETECTED Final   Carbapenem resistance VIM NOT DETECTED NOT DETECTED Final    Comment: Performed at [facility], [address] [place], [place] [address]  Urine Culture     Status: None   Collection Time: 07/04/21 11:25 PM   Specimen: Urine, Catheterized  Result Value Ref Range Status   Specimen Description URINE, CATHETERIZED  Final   Special Requests NONE  Final   Culture   Final    NO GROWTH Performed at [facility], [address] [place], [place] [address]    Report Status 07/06/2021 FINAL  Final  Culture, blood (routine x 2)     Status: None (Preliminary result)   Collection Time: 07/05/21  4:37 PM   Specimen: BLOOD LEFT HAND  Result Value Ref Range Status   Specimen Description BLOOD LEFT HAND  Final   Special Requests   Final    BOTTLES DRAWN AEROBIC ONLY Blood Culture adequate volume    Culture   Final    NO GROWTH 4 DAYS Performed at [facility], [place] [address] [place], [place] [address]    Report Status PENDING  Incomplete  Culture, blood (routine x 2)     Status: None (Preliminary result)   Collection Time: 07/05/21  4:37 PM   Specimen: BLOOD  Result Value Ref Range Status   Specimen Description BLOOD LEFT ANTECUBITAL  Final   Special Requests   Final    BOTTLES DRAWN AEROBIC ONLY Blood Culture results may not be optimal due to an inadequate volume of blood received in culture bottles   Culture   Final    NO GROWTH 4 DAYS Performed at [facility], [place] [address] [place], [place] [address]    Report Status PENDING  Incomplete  Anaerobic culture w Gram Stain     Status: None (Preliminary result)   Collection Time: 07/08/21  6:09 PM   Specimen: Hemodialysis Graft; Other  Result Value Ref Range Status   Specimen Description HEMODIALYSIS GRAFT  Final   Special Requests RT FEM POP GRAFT  Final   Gram Stain   Final    ABUNDANT WBC PRESENT, PREDOMINANTLY PMN NO ORGANISMS SEEN Performed at [facility], [address] [place], [place] [address]    Culture PENDING  Incomplete   Report Status PENDING  Incomplete  Aerobic Culture w Gram Stain (superficial specimen)     Status: None (Preliminary result)   Collection Time: 07/08/21  6:09 PM   Specimen: Hemodialysis Graft  Result Value Ref Range Status   Specimen Description HEMODIALYSIS GRAFT  Final   Special Requests RT FEM POP GRAFT  Final   Gram Stain PENDING  Incomplete   Culture   Final    CULTURE REINCUBATED FOR BETTER GROWTH Performed at [facility], [place] [address], [place], [place] [address]    Report Status PENDING  Incomplete     Serology:   Imaging: If present, new imagings (plain films, ct scans, and mri) have been personally visualized and interpreted; radiology reports have been reviewed. Decision making incorporated into the Impression / Recommendations.   Jabier Mutton, [facility] [place] [phone] pager    07/09/2021, 1:11 PM

## 2021-07-09 NOTE — TOC Progression Note (Signed)
Transition of Care [facility]) - Progression Note    Patient Details  Name: Lonnie [name] MRN: [id number] Date of Birth: [birth year]  Transition of Care [facility]) CM/SW Contact  Eduard Roux, [place] Phone Number: 07/09/2021, 2:13 PM  Clinical Narrative:     CSW spoke with patient's wife via phone call- CSW informed recommendations remains short term rehab at [facility]. She requested patient be screened for CIR. If CIR is not appropriate she remains agreeable to short term rehab at [facility].  CSW will follow up on wife's request for CIR CSW will continue to follow and assist with discharge planning.  Antony Blackbird, MSW, LCSW Clinical Social Worker       Barriers to Discharge: Continued Medical Work up  Expected Discharge Plan and Services                                                 Social Determinants of Health (SDOH) Interventions    Readmission Risk Interventions Readmission Risk Prevention Plan 06/14/2021 09/12/2020 03/12/2020  Post Dischage Appt Complete - -  Medication Screening Complete - -  Transportation Screening Complete Complete -  PCP or Specialist Appt within 3-5 Days - Complete -  HRI or Home Care Consult - Complete -  Social Work Consult for Recovery Care Planning/Counseling - Complete -  Palliative Care Screening - Not Applicable -  Medication Review Oceanographer) - Complete Complete  Some recent data might be hidden

## 2021-07-09 NOTE — Progress Notes (Addendum)
  Progress Note    07/09/2021 7:56 AM 1 Day Post-Op  Subjective:  wants to go home   Vitals:   07/09/21 0600 07/09/21 0739  BP:  (!) 129/56  Pulse:  65  Resp:  17  Temp: 98.2 F (36.8 C) 98.8 F (37.1 C)  SpO2:  98%   Physical Exam: Lungs:  non labored Incisions:  groin incisions c/d/I; R AKA incision c/d/i Abdomen:  A&O Neurologic: A&O  CBC    Component Value Date/Time   WBC 15.4 (H) 07/08/2021 0519   RBC 3.63 (L) 07/08/2021 0519   HGB 9.6 (L) 07/08/2021 0519   HGB 15.7 01/10/2020 1146   HCT 30.3 (L) 07/08/2021 0519   HCT 46.1 01/10/2020 1146   PLT 453 (H) 07/08/2021 0519   PLT 232 01/10/2020 1146   MCV 83.5 07/08/2021 0519   MCV 83 01/10/2020 1146   MCH 26.4 07/08/2021 0519   MCHC 31.7 07/08/2021 0519   RDW 21.0 (H) 07/08/2021 0519   RDW 12.8 01/10/2020 1146   LYMPHSABS 1.8 07/08/2021 0519   LYMPHSABS 2.5 03/01/2015 1414   MONOABS 1.1 (H) 07/08/2021 0519   EOSABS 0.3 07/08/2021 0519   EOSABS 0.4 03/01/2015 1414   BASOSABS 0.1 07/08/2021 0519   BASOSABS 0.1 03/01/2015 1414    BMET    Component Value Date/Time   NA 136 07/08/2021 0519   NA 142 01/10/2020 1146   K 4.0 07/08/2021 0519   CL 106 07/08/2021 0519   CO2 22 07/08/2021 0519   GLUCOSE 113 (H) 07/08/2021 0519   BUN 8 07/08/2021 0519   BUN 19 01/10/2020 1146   CREATININE 0.59 (L) 07/08/2021 0519   CREATININE 0.94 05/24/2021 1549   CALCIUM 8.4 (L) 07/08/2021 0519   GFRNONAA >60 07/08/2021 0519   GFRAA >60 09/12/2020 0357    INR    Component Value Date/Time   INR 1.3 (H) 07/04/2021 0915     Intake/Output Summary (Last 24 hours) at 07/09/2021 0756 Last data filed at 07/09/2021 0120 Gross per 24 hour  Intake 2751.45 ml  Output 3200 ml  Net -448.55 ml     Assessment/Plan:  70 y.o. male is s/p removal of infected RLE bypass graft 1 Day Post-Op   Groin incisions are unremarkable Dry dressing changes daily to all incisions Encouraged nutrition Continue current antibiotic regimen  per ID    Emilie Rutter, PA-C [facility] [phone] 07/09/2021 7:56 AM  Groin healing so far Home when pain controlled and antibiotic regimen determined Maybe by end of the week.  Fabienne Bruns, MD [facility] [place] Office: [phone]

## 2021-07-09 NOTE — Progress Notes (Signed)
PHARMACY CONSULT NOTE FOR:  OUTPATIENT  PARENTERAL ANTIBIOTIC THERAPY (OPAT)  Indication: Vascular graft infection Regimen: cefepime 2g IV q8h End date: 08/06/2021  IV antibiotic discharge orders are pended. To discharging provider:  please sign these orders via discharge navigator,  Select New Orders & click on the button choice - Manage This Unsigned Work.     Thank you for allowing pharmacy to be a part of this patient's care.  Jeannetta Nap 07/09/2021, 1:24 PM

## 2021-07-09 NOTE — Progress Notes (Signed)
Occupational Therapy Treatment Patient Details Name: Lonnie [name] MRN: [id number] DOB: [birth year] Today's Date: 07/09/2021    History of present illness Lonnie [name] is a 70 y.o. male who underwent peripheral vascular thrombectomy of R LE and graft placement 6/17 and discharged to home. Pt readmitted 6/25 due to AMS, AKI, metabolic encephalopathy. He was found to be COVID+. He underwent exploration of R below knee popliteal wound and noted with acute ischemic wounds and gangrene R foot as well as graft infection. 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 Patient s/p complete graft removal 7/18. There was a vein patch with prolene suture PMH: dementia, PAD, tobacco use disorder, chronic depression/anxiety, impaired vision, iron deficiency anemia, peripheral neuropathy, essential HTN, HLD, GERD, BPH, falls, SDH s/p crani (2020).   OT comments  Pt progressing gradually towards goals but did endorse feeling frustrated at prolonged hospitalization and desire to return home. Pt able to demo sit to stand with Min A x 2 using RW though difficulty hopping today and attempted premature sit during transfer requiring Max A x 2 for safe guiding into recliner. Pt also noted with word finding difficulty after transfer (MD/RN aware). Plan to progress BSC transfers and UE HEP carryover in next session as appropriate. Continue to recommend SNF rehab.   Follow Up Recommendations  SNF;Supervision/Assistance - 24 hour    Equipment Recommendations  3 in 1 bedside commode;Wheelchair (measurements OT);Wheelchair cushion (measurements OT)    Recommendations for Other Services      Precautions / Restrictions Precautions Precautions: Fall Precaution Comments: R AKA Restrictions Weight Bearing Restrictions: Yes RLE Weight Bearing: Non weight bearing       Mobility Bed Mobility Overal bed mobility: Needs Assistance Bed Mobility: Supine to Sit     Supine to  sit: Supervision     General bed mobility comments: no assist needed, able to perform with flat HOB no rails with increased time and cues to scoot hips forward    Transfers Overall transfer level: Needs assistance Equipment used: Rolling walker (2 wheeled) Transfers: Sit to/from Stand Sit to Stand: Min assist;+2 safety/equipment Stand pivot transfers: Max assist;+2 physical assistance       General transfer comment: Pt +5minA to stand from EOB initially to RW. Pt impulsive to sit toward end of stand pivot with RW needing +2 maxA for safe guidance into chair. Pt attempted to stand from chair height>RW but once mostly upright with +2 modA, pt impulsively returns to sitting, unclear whether due to RLE pain or fatigue.    Balance Overall balance assessment: Needs assistance Sitting-balance support: Feet supported Sitting balance-Leahy Scale: Fair Sitting balance - Comments: static sitting EOB no LOB Postural control: Posterior lean Standing balance support: Bilateral upper extremity supported;During functional activity Standing balance-Leahy Scale: Poor (poor to zero) Standing balance comment: reliant on BUE support due to R AKA + external support, impulsive to sit during pivot transfer and unable to hop so deferred further gait trials                           ADL either performed or assessed with clinical judgement   ADL Overall ADL's : Needs assistance/impaired                                       General ADL Comments: Session focused on continued practice  with RW transfers, maximizing balance and endurance with daily tasks.     Vision   Vision Assessment?: No apparent visual deficits Additional Comments: reports vision is fine   Perception     Praxis      Cognition Arousal/Alertness: Awake/alert Behavior During Therapy: WFL for tasks assessed/performed Overall Cognitive Status: History of cognitive impairments - at baseline Area of  Impairment: Safety/judgement;Following commands;Problem solving;Memory;Attention                   Current Attention Level: Sustained Memory: Decreased short-term memory Following Commands: Follows one step commands with increased time;Follows multi-step commands inconsistently Safety/Judgement: Decreased awareness of safety;Decreased awareness of deficits Awareness: Emergent Problem Solving: Slow processing;Difficulty sequencing;Requires verbal cues;Requires tactile cues General Comments: Hx of dementia, increased time for following directions, safety and awareness (per chart review, delayed processing post-SDH and craniotomy in 2020). Pt with increased word-finding difficulty during session and increased frustration with mobility challenges. While pt is very pleasant with sitting, once up in weightbearing he becomes more agitated and impulsive, requiring increased multimodal cuing for safety.        Exercises Exercises: Other exercises Other Exercises Other Exercises: standing LLE AROM: heel raises x5 reps Other Exercises: supine BUE AROM: finger flex/ext, wrist flex/ext, elbow flex/ext, shoulder abduction (with elbows flexed) x10 reps ea Other Exercises: STS x 2 attempts (1 successful) Other Exercises: cues for chair push-ups but pt not receptive to attempt   Shoulder Instructions       General Comments VSS on RA, pt with noted word finding difficulty, MD in room and aware, RN notified.    Pertinent Vitals/ Pain       Pain Assessment: Faces Faces Pain Scale: Hurts little more Pain Location: R residual limb Pain Descriptors / Indicators: Grimacing;Guarding Pain Intervention(s): Monitored during session  Home Living                                          Prior Functioning/Environment              Frequency  Min 2X/week        Progress Toward Goals  OT Goals(current goals can now be found in the care plan section)  Progress towards OT  goals: Progressing toward goals  Acute Rehab OT Goals Patient Stated Goal: go home OT Goal Formulation: With patient Time For Goal Achievement: 07/17/21 Potential to Achieve Goals: Good ADL Goals Pt Will Perform Grooming: with modified independence;standing Pt Will Transfer to Toilet: with min assist;squat pivot transfer;stand pivot transfer;bedside commode Pt Will Perform Toileting - Clothing Manipulation and hygiene: with min assist;sitting/lateral leans Additional ADL Goal #1: Pt to complete sit to stand transfers with Min A in prep for ADL tasks  Plan Discharge plan remains appropriate    Co-evaluation    PT/OT/SLP Co-Evaluation/Treatment: Yes Reason for Co-Treatment: For patient/therapist safety;To address functional/ADL transfers PT goals addressed during session: Mobility/safety with mobility;Balance;Proper use of DME;Strengthening/ROM OT goals addressed during session: ADL's and self-care      AM-PAC OT "6 Clicks" Daily Activity     Outcome Measure   Help from another person eating meals?: None Help from another person taking care of personal grooming?: A Little Help from another person toileting, which includes using toliet, bedpan, or urinal?: A Lot Help from another person bathing (including washing, rinsing, drying)?: A Lot Help from another person to put on and taking off  regular upper body clothing?: A Little Help from another person to put on and taking off regular lower body clothing?: A Lot 6 Click Score: 16    End of Session Equipment Utilized During Treatment: Gait belt;Rolling walker  OT Visit Diagnosis: Unsteadiness on feet (R26.81);Other abnormalities of gait and mobility (R26.89);Muscle weakness (generalized) (M62.81);Pain;Other symptoms and signs involving cognitive function Pain - Right/Left: Right Pain - part of body: Leg   Activity Tolerance Patient tolerated treatment well   Patient Left in chair;with call bell/phone within reach;with chair  alarm set;Other (comment) (MDs present)   Nurse Communication Mobility status        Time: 3202-3343 OT Time Calculation (min): 23 min  Charges: OT General Charges $OT Visit: 1 Visit OT Treatments $Therapeutic Activity: 8-22 mins  Bradd Canary, OTR/L Acute Rehab Services Office: [phone]    Lorre Munroe 07/09/2021, 2:07 PM

## 2021-07-10 ENCOUNTER — Inpatient Hospital Stay (HOSPITAL_COMMUNITY): Payer: Medicare Other

## 2021-07-10 DIAGNOSIS — G9341 Metabolic encephalopathy: Secondary | ICD-10-CM | POA: Diagnosis not present

## 2021-07-10 LAB — COMPREHENSIVE METABOLIC PANEL
ALT: 140 U/L — ABNORMAL HIGH (ref 0–44)
AST: 49 U/L — ABNORMAL HIGH (ref 15–41)
Albumin: 2.1 g/dL — ABNORMAL LOW (ref 3.5–5.0)
Alkaline Phosphatase: 135 U/L — ABNORMAL HIGH (ref 38–126)
Anion gap: 5 (ref 5–15)
BUN: 8 mg/dL (ref 8–23)
CO2: 26 mmol/L (ref 22–32)
Calcium: 8.4 mg/dL — ABNORMAL LOW (ref 8.9–10.3)
Chloride: 105 mmol/L (ref 98–111)
Creatinine, Ser: 0.68 mg/dL (ref 0.61–1.24)
GFR, Estimated: >60 mL/min (ref >60)
Glucose, Bld: 109 mg/dL — ABNORMAL HIGH (ref 70–99)
Potassium: 3.9 mmol/L (ref 3.5–5.1)
Sodium: 136 mmol/L (ref 135–145)
Total Bilirubin: 0.8 mg/dL (ref 0.3–1.2)
Total Protein: 5.8 g/dL — ABNORMAL LOW (ref 6.5–8.1)

## 2021-07-10 LAB — CULTURE, BLOOD (ROUTINE X 2)
Culture: NO GROWTH
Culture: NO GROWTH
Special Requests: ADEQUATE

## 2021-07-10 LAB — CBC WITH DIFFERENTIAL/PLATELET
Abs Immature Granulocytes: 0.38 10*3/uL — ABNORMAL HIGH (ref 0.00–0.07)
Basophils Absolute: 0.1 10*3/uL (ref 0.0–0.1)
Basophils Relative: 0 %
Eosinophils Absolute: 0.4 10*3/uL (ref 0.0–0.5)
Eosinophils Relative: 2 %
HCT: 26.1 % — ABNORMAL LOW (ref 39.0–52.0)
Hemoglobin: 8.4 g/dL — ABNORMAL LOW (ref 13.0–17.0)
Immature Granulocytes: 2 %
Lymphocytes Relative: 10 %
Lymphs Abs: 1.6 10*3/uL (ref 0.7–4.0)
MCH: 26.6 pg (ref 26.0–34.0)
MCHC: 32.2 g/dL (ref 30.0–36.0)
MCV: 82.6 fL (ref 80.0–100.0)
Monocytes Absolute: 1.2 10*3/uL — ABNORMAL HIGH (ref 0.1–1.0)
Monocytes Relative: 7 %
Neutro Abs: 13.6 10*3/uL — ABNORMAL HIGH (ref 1.7–7.7)
Neutrophils Relative %: 79 %
Platelets: 503 10*3/uL — ABNORMAL HIGH (ref 150–400)
RBC: 3.16 MIL/uL — ABNORMAL LOW (ref 4.22–5.81)
RDW: 21.6 % — ABNORMAL HIGH (ref 11.5–15.5)
WBC: 17.2 10*3/uL — ABNORMAL HIGH (ref 4.0–10.5)
nRBC: 0 % (ref 0.0–0.2)

## 2021-07-10 LAB — PHOSPHORUS: Phosphorus: 3.1 mg/dL (ref 2.5–4.6)

## 2021-07-10 LAB — MAGNESIUM: Magnesium: 2 mg/dL (ref 1.7–2.4)

## 2021-07-10 IMAGING — DX DG SKULL 1-3V
2 series · 2 of 2 positions shown · non-contrast
Comparison: Previous head CT

CLINICAL DATA: Screening for MRI.

EXAM:
SKULL - 1-3 VIEW

[t skull ap]
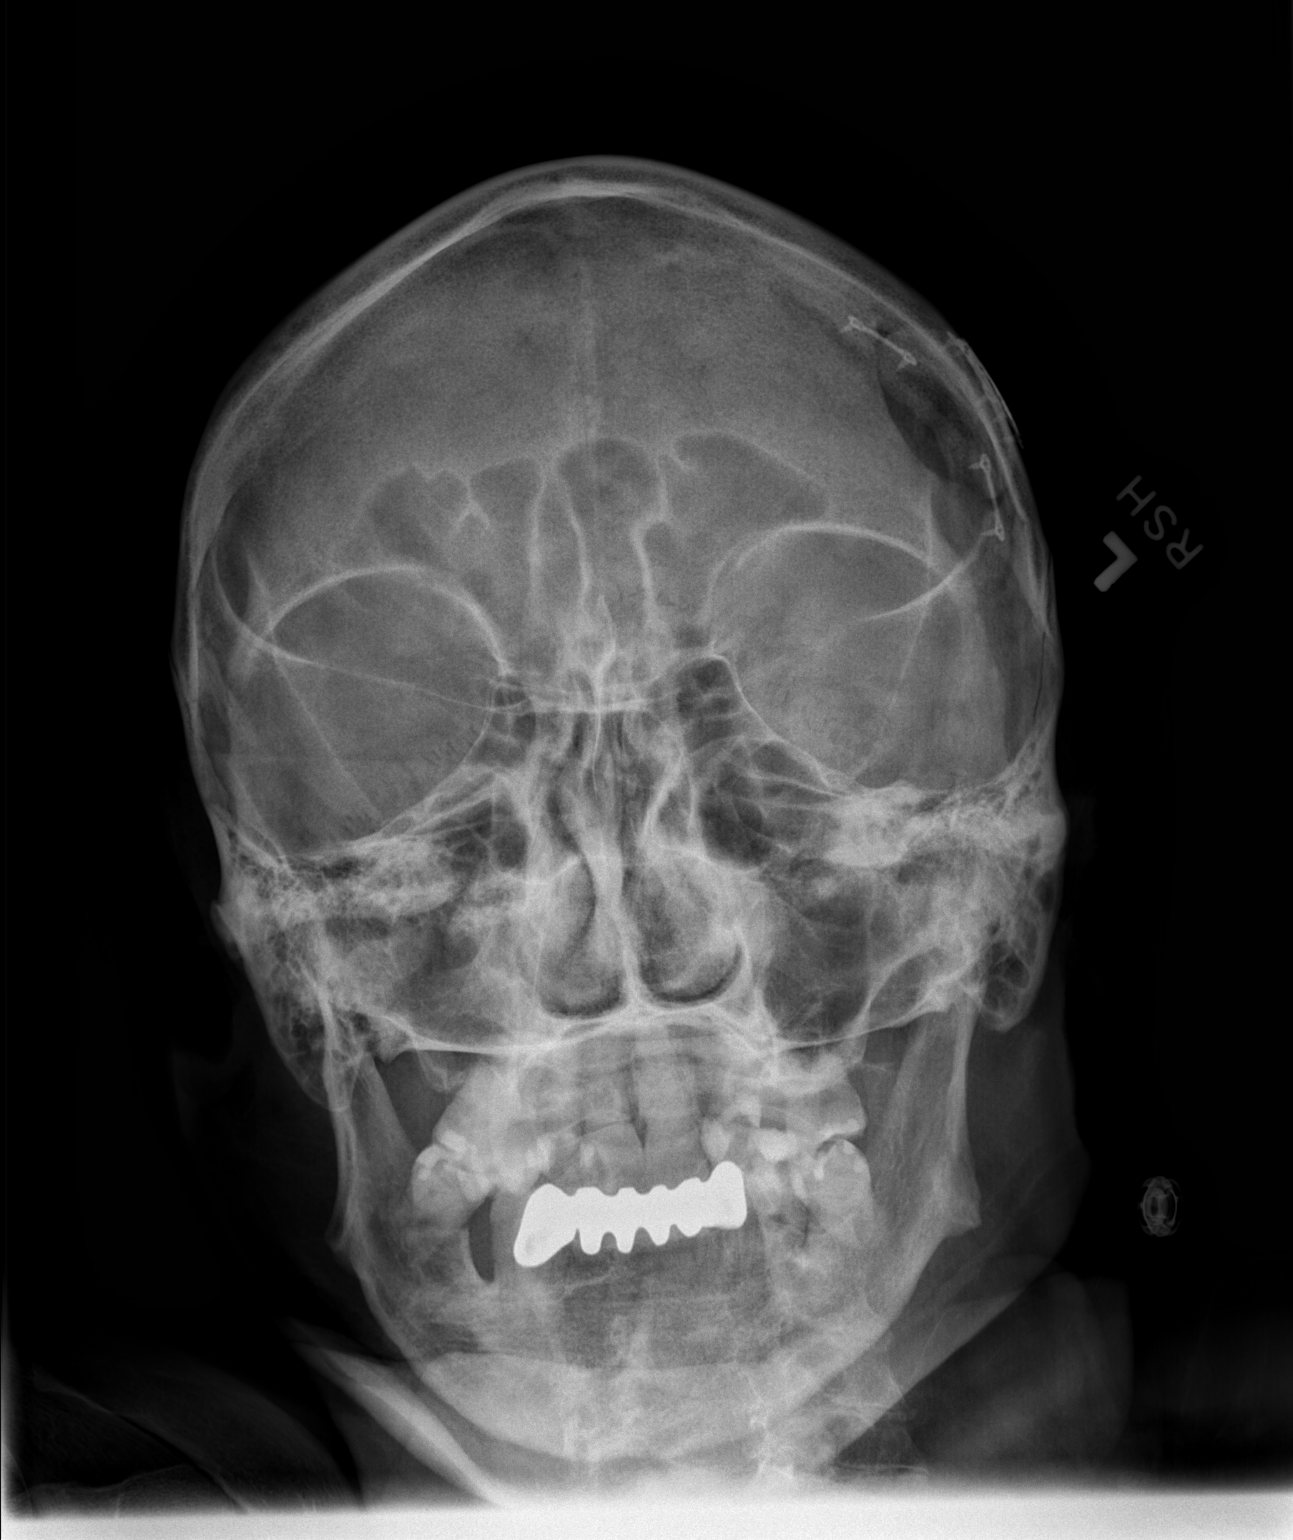

[t skull lat]
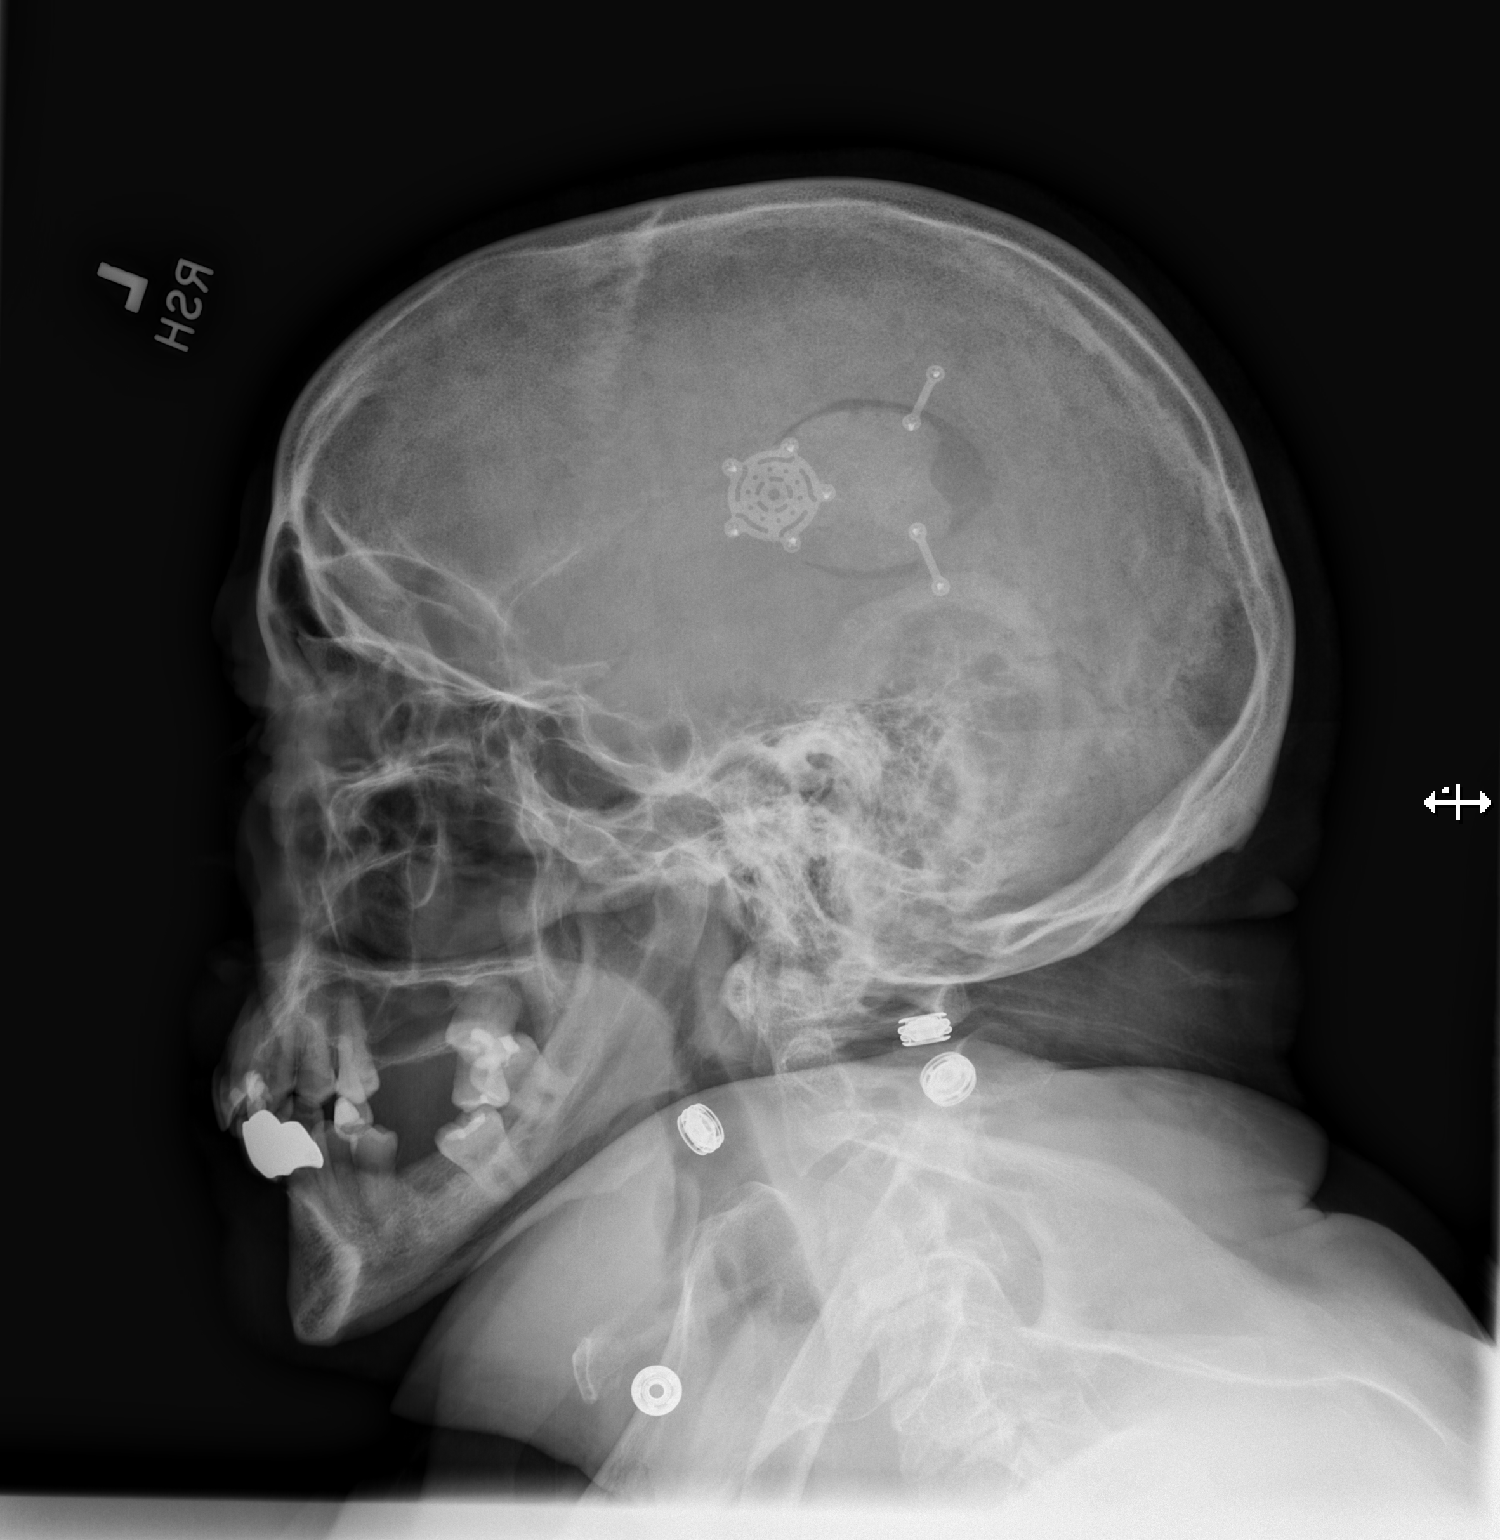

[2 of 2 positions shown; findings below may reference images not displayed]

FINDINGS: Previous left parietal craniotomy has a good appearance. No
intracranial metallic structure is identified. No contraindicating
feature for MRI.
IMPRESSION: Previous left parietal craniotomy.  No contraindicating feature.

## 2021-07-10 IMAGING — DX DG ABDOMEN 1V
1 series · 1 of 1 positions shown · non-contrast
Comparison: None.

CLINICAL DATA: Screening for MRI

EXAM:
ABDOMEN - 1 VIEW

[t abdomen supine]
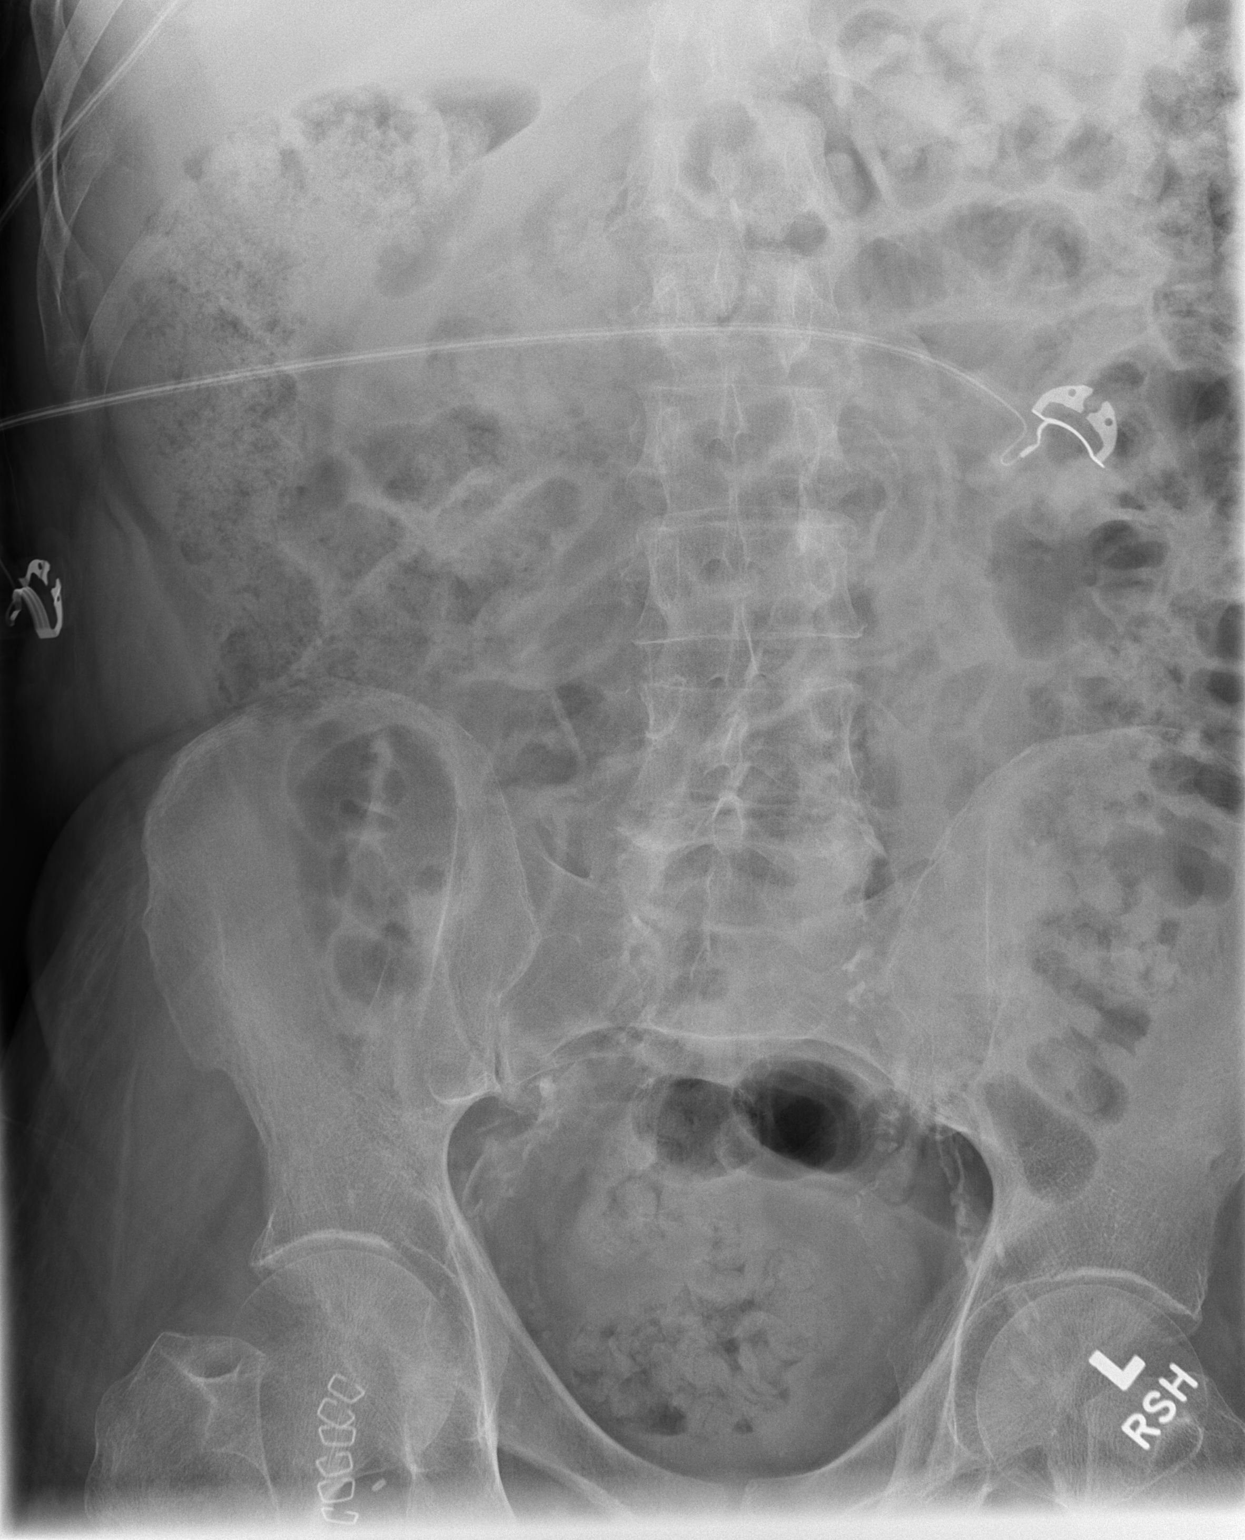

[1 of 1 positions shown; findings below may reference images not displayed]

FINDINGS: Left side of the abdomen is not included. Upper abdomen is not
included. No radiopaque foreign object seen in the region visible.
Surgical clips in the right groin related to previous vascular
procedure.
IMPRESSION: No contraindicating finding in the region visible. The entire
abdomen is not excluded.

## 2021-07-10 IMAGING — MR MR HEAD W/O CM
4 of 10 series · 21 of 48 positions shown · non-contrast
Comparison: Head CT [DATE]

CLINICAL DATA: Recent peripheral vascular procedure. Readmission
with altered mental status, acute kidney injury in metabolic
encephalopathy.

EXAM:
MRI HEAD WITHOUT CONTRAST
TECHNIQUE: Multiplanar, multiecho pulse sequences of the brain and surrounding
structures were obtained without intravenous contrast.

[Series 2: DWI · axial · 3.0mm · 0.94mm/px · z∈[-67,+79]mm · 8 of 98 slices shown (1 of 2)]
[im 1/98]
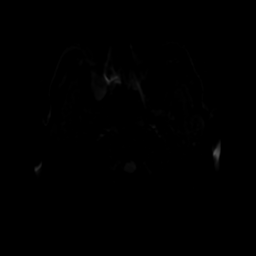
[im 11/98]
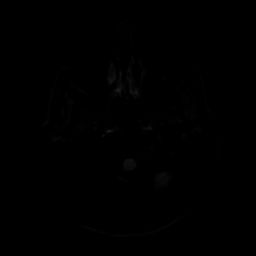
[im 33/98]
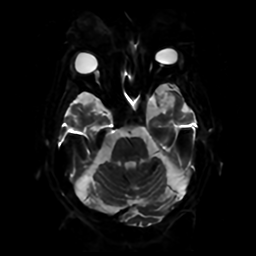
[im 44/98]
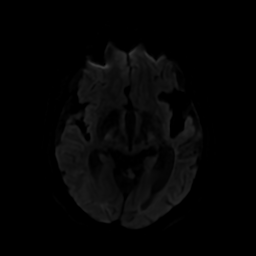
[im 54/98]
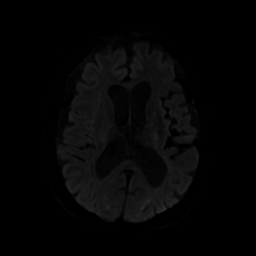
[im 65/98]
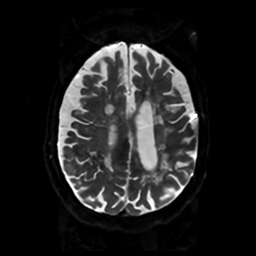
[im 87/98]
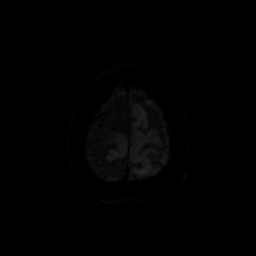
[im 98/98]
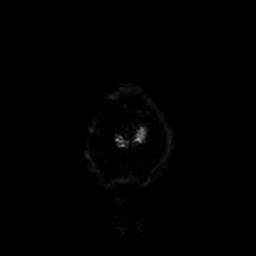

[Series 3: DWI · coronal · 4.0mm · 0.94mm/px · 6 of 74 slices shown (2 of 2)]
[im 1/74]
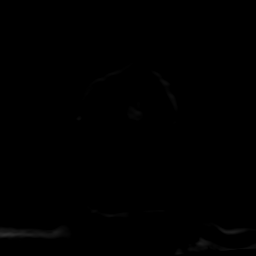
[im 11/74]
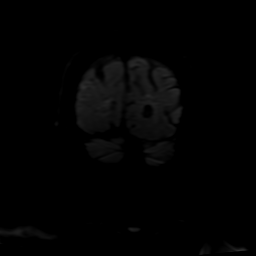
[im 21/74]
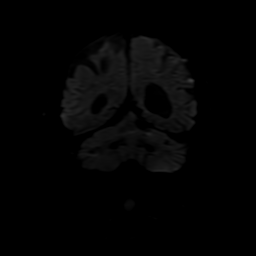
[im 32/74]
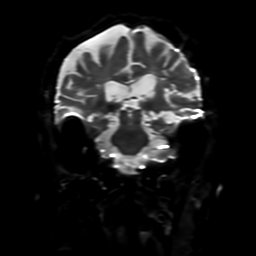
[im 42/74]
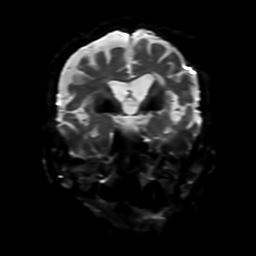
[im 63/74]
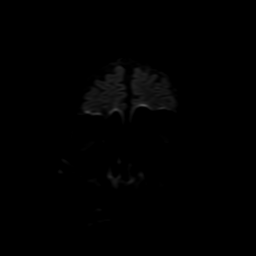

[Series 4: FLAIR · sagittal · 5.0mm · 0.23mm/px · 3 of 24 slices shown (1 of 2)]
[im 1/24]
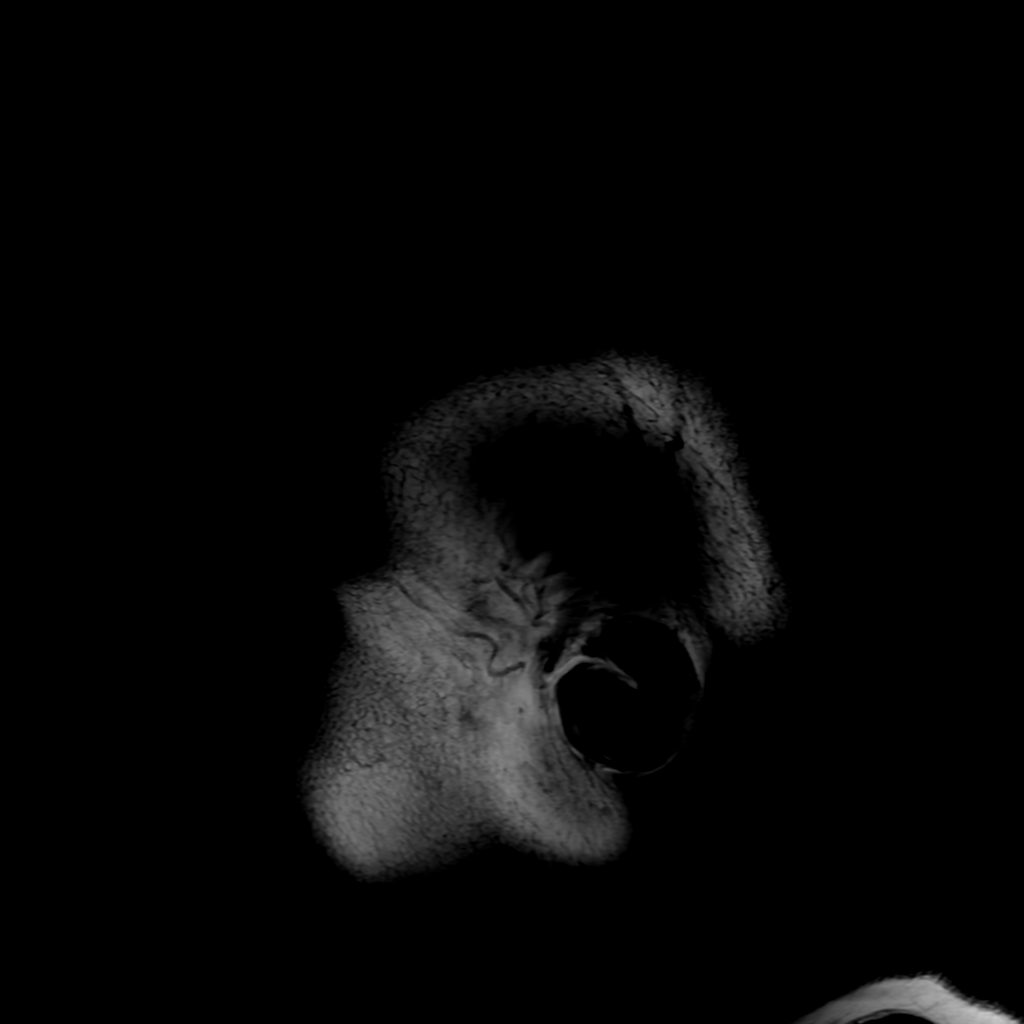
[im 12/24]
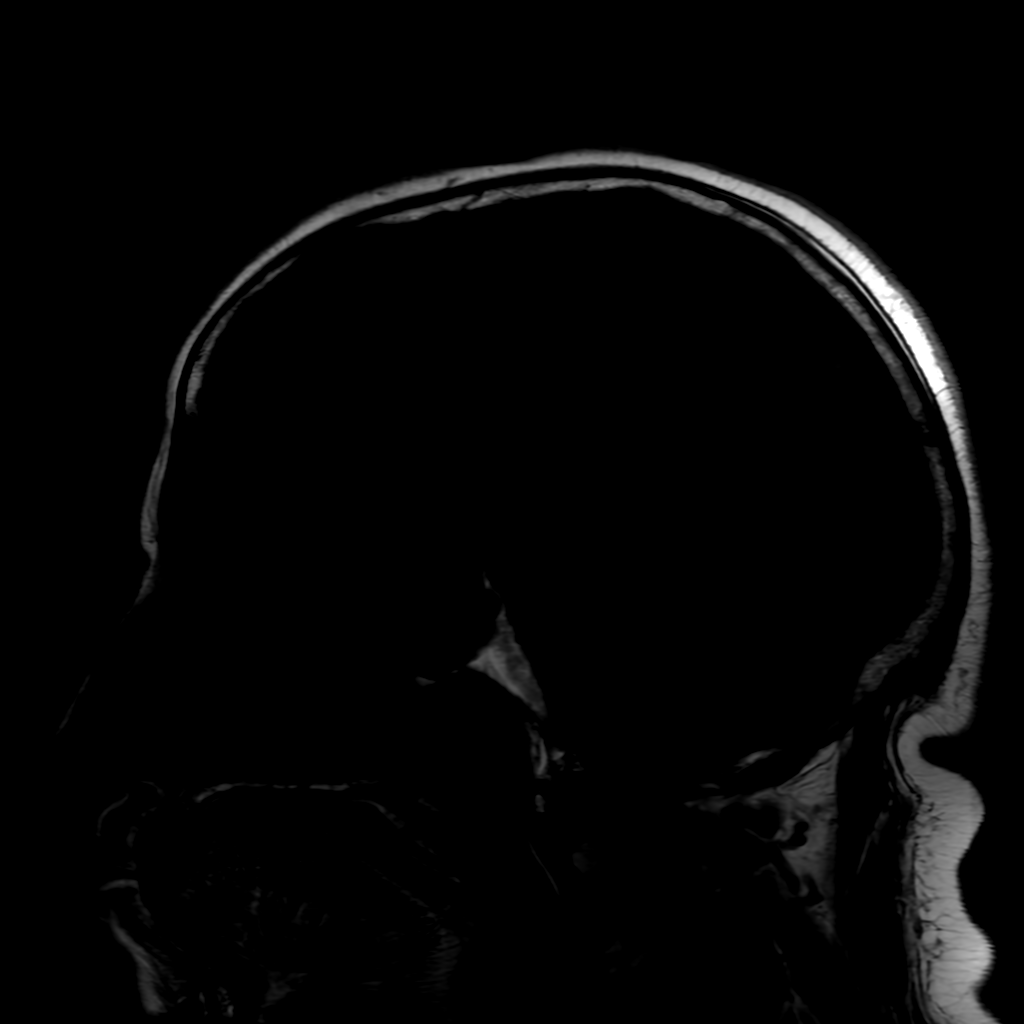
[im 24/24]
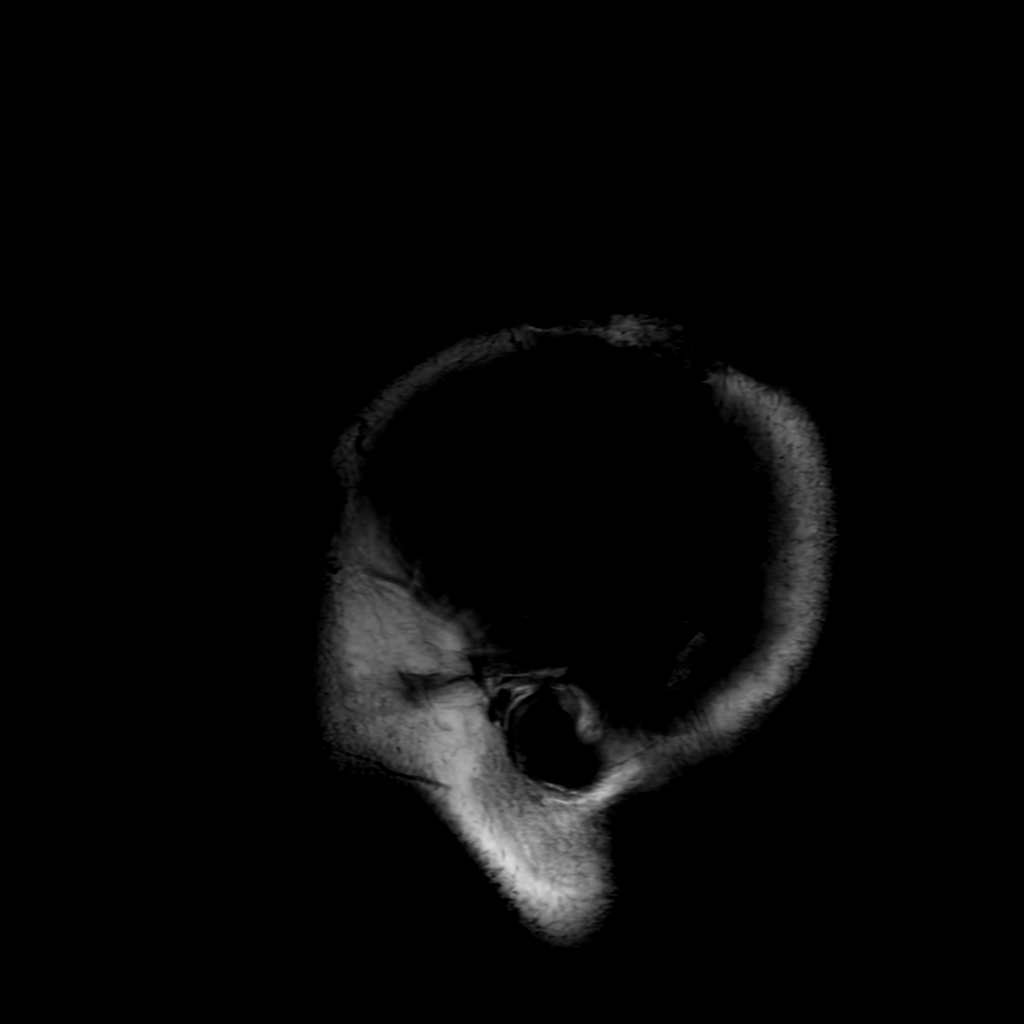

[Series 6: FLAIR · axial · 4.0mm · 0.45mm/px · z∈[-69,+80]mm · 4 of 35 slices shown (2 of 2)]
[im 1/35]
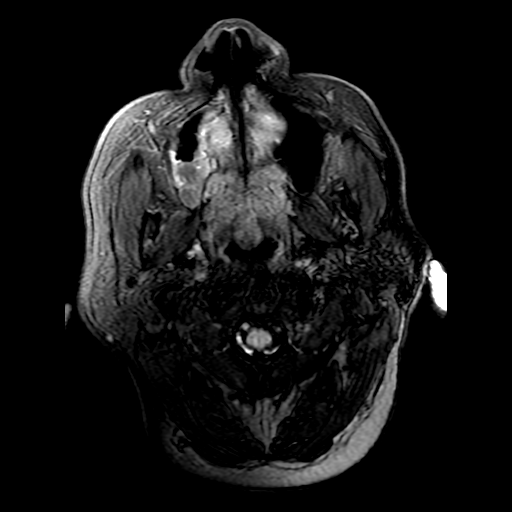
[im 12/35]
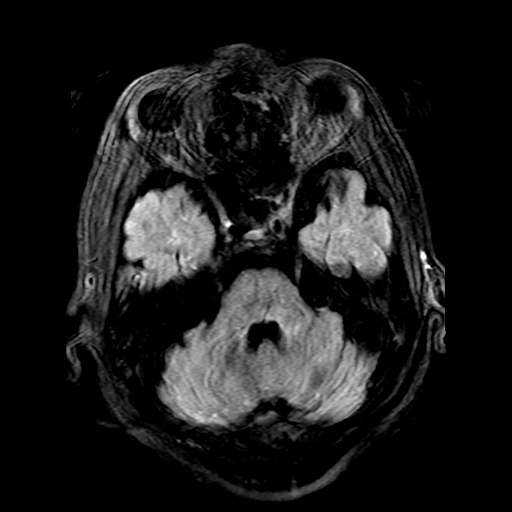
[im 23/35]
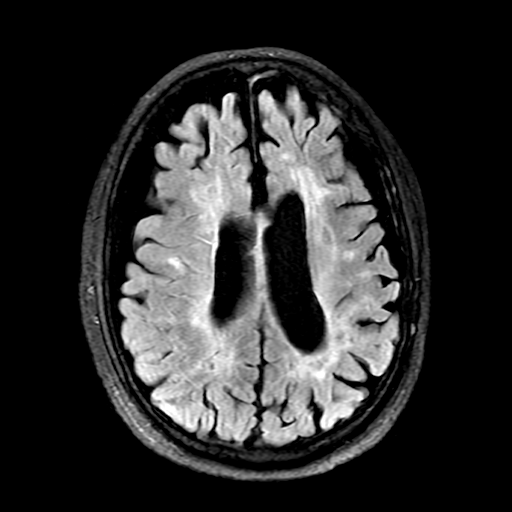
[im 35/35]
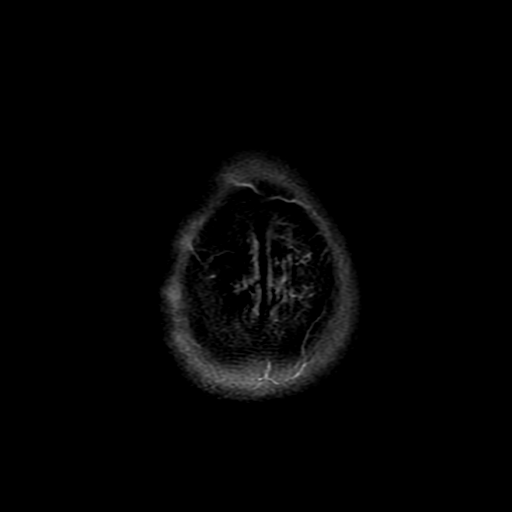

[21 of 48 positions shown; findings below may reference images not displayed]

FINDINGS: Brain: Diffusion imaging does not show any acute or subacute
infarction. Mild chronic small-vessel change affects the pons. No
focal cerebellar insult. Cerebral hemispheres show generalized
atrophy with pronounced chronic small-vessel ischemic changes of the
white matter. No cortical or large vessel territory infarction. No
mass lesion or hemorrhage. Bilateral subdural hygromas appears
similar, 8-9 mm thick on the right and 4-5 mm thick on the left. No
significant mass effect.

Vascular: Major vessels at the base of the brain show flow.

Skull and upper cervical spine: Previous left parietal craniotomy.

Sinuses/Orbits: Clear except for small amount of fluid in the right
maxillary sinus. Orbits negative.

Other: None
IMPRESSION: No acute or reversible finding. Chronic simple appearing subdural
hygromas, 8-9 mm thick on the right and 4-5 mm thick on the left,
without significant mass-effect. Extensive chronic small-vessel
ischemic changes affecting the brain, particularly affecting the
cerebral hemispheric white matter.

Some fluid layering in the right maxillary sinus. Similar to
previous studies.

## 2021-07-10 MED ORDER — SODIUM CHLORIDE 0.9% FLUSH
10.0000 mL | Freq: Two times a day (BID) | INTRAVENOUS | Status: DC
  Administered 2021-07-10: 10 mL

## 2021-07-10 MED ORDER — SODIUM CHLORIDE 0.9% FLUSH: 10.0000 mL | INTRAVENOUS | Status: DC | PRN

## 2021-07-10 NOTE — Progress Notes (Signed)
Peripherally Inserted Central Catheter Placement  The IV Nurse has discussed with the patient and/or persons authorized to consent for the patient, the purpose of this procedure and the potential benefits and risks involved with this procedure.  The benefits include less needle sticks, lab draws from the catheter, and the patient may be discharged home with the catheter. Risks include, but not limited to, infection, bleeding, blood clot (thrombus formation), and puncture of an artery; nerve damage and irregular heartbeat and possibility to perform a PICC exchange if needed/ordered by physician.  Alternatives to this procedure were also discussed.  Bard Power PICC patient education guide, fact sheet on infection prevention and patient information card has been provided to patient /or left at bedside. Consent obtained with daughter via telephone    PICC Placement Documentation  PICC Single Lumen 07/10/21 Right Brachial 40 cm 0 cm (Active)  Indication for Insertion or Continuance of Line Home intravenous therapies (PICC only) 07/10/21 1400  Exposed Catheter (cm) 0 cm 07/10/21 1400  Site Assessment Clean;Dry;Intact 07/10/21 1400  Line Status Flushed;Saline locked;Blood return noted 07/10/21 1400  Dressing Type Transparent;Securing device 07/10/21 1400  Dressing Status Clean;Dry;Intact 07/10/21 1400  Antimicrobial disc in place? Yes 07/10/21 1400  Safety Lock Not Applicable 07/10/21 1400  Line Care Connections checked and tightened 07/10/21 1400  Dressing Intervention New dressing 07/10/21 1400  Dressing Change Due 07/17/21 07/10/21 1400       Franne Grip [place] 07/10/2021, 2:08 PM

## 2021-07-10 NOTE — Progress Notes (Signed)
Received call from CCMD, notified pt had 8 beats of V tach. Pt asymptomatic. Will continue to monitor the pt.   Lawson Radar, RN

## 2021-07-10 NOTE — Progress Notes (Signed)
PROGRESS NOTE    Lonnie [name]  UJW:[id number] DOB: 1951/09/22 DOA: 06/15/2021 PCP: [name], MD   No chief complaint on file.  Brief Narrative:  70 yo with hx CAD s/p CABG x4 2014, subdural hematoma s/p craniotomy (2020, with residual balance deficits and delayed processing), ischemic cardiomyopathy, right popliteal bypass with PTFE graft 01/23/2020, recent admission 06/07/21 with occluded R fem pop bypass s/p thrombolysis, mechanical thrombectomy of R femoropoliteal bypass graft, stent x2 of the right femoral bypass graft.  He was readmitted 6/25 with weakness and confusion and found to have bladder rentention and possible UTI (multiple species).  His covid testing was positive and he was treated with steroids and remdesivir.  He developed spontaneous bleeding on 7/6 from prior area of R femoropopliteal bypass and then large volume bleeding with hemorrhagic shock on 7/7.  He was taken to the OR by Dr. Chestine Spore on 7/7 for exploration of R below knee popliteal wound, repair of R popliteal vein, ligation of R popliteal artery, and placement of negative pressure wound vac.  He had right AKA on 7/12 with gangrene of R foot.  He developed sepsis on 7/13-14 and was found to have pseudomonas bacteremia related to his infected graft.  He's currently on cefepime.  7/18 s/p removal of infected RLE bypass graft.  ID consulted and recommending 4 weeks of cefepime, then oral cipro.  MRI brain ordered 7/19 due to word finding difficulties.    Assessment & Plan:   Active Problems:   Acute metabolic encephalopathy   AKI (acute kidney injury) (HCC)   Status post above-knee amputation of right lower extremity (HCC)   Altered mental status   Bacteremia due to Pseudomonas   Infected prosthetic vascular graft (HCC)  Acute Metabolic Encephalopathy/delirium, postoperative Developed initially last week with infection And confusion again post op on 07/09/2021- word finding difficulties and some R>L ataxia.  Ended up  having MRI brain which is negative for any acute stroke or any acute pathology.  Patient is fully alert and oriented today.  This is likely delirium continue delirium precautions.  Sepsis secondary to pseudomonas bacteremia Graft segment sent for culture with moderate pseudomonas 7/12 - sensitive to cefepime UA bland, CXR without acute disease RUQ [place] with mild fatty infiltration of the liver 2/2 blood cultures with pseudomonas, sensitive to cefepime Follow repeat blood cultures 07/05/2021 NGTDx3  Vascular on board.  S/p removal right femoral popliteal bypass, vein patch angiopalsty right common femoral artery using contralateral saphenous vein on 07/08/2021 ID c/s, appreciate recs -> plan for PICC tomorrw, cefepime x4 weeks 7/19-8/16 (see dosing per ID note 7/19), ciprofloxaxin after 8/16 for 3-6 months for suppression Echo with EF 50%, RWMA  Elevated Liver Enzymes  Fatty Liver  Likely shock liver, improving  Hepatocellular pattern of liver injury -> continues to improve Negative acute hepatitis panel Minimize meds which could contribute to liver injury RUQ with mild fatty infiltration of liver  Acute Blood Loss Anemia with Hypovolemic Shock Occluded Right Femoropopliteal Bypass s/p lysis complicated by bleeding with disruption and reocclusion of graft S/p right above knee amputation  S/p exploration of R below knee popliteal wound, repair of R popliteal vein, ligation of right popliteal artery and placement of negative pressure wound vac on 7/7 S/p right AKA 7/12 S/p 4 units pRBC (I see 5 units ordered, only 4 documented as being completed, one as "transfusing" - unclear) Continue aspirin, plavix Gabapentin for phantom pain  Hb relatively stable  Severe PAD  CAD s/p CABG  x4 Aspirin, plavix Not currently on statin, unclear reason, will review (elevated LFT's at this time, follow, consider if improved) Continue metoprolol F/u with cardiology and vascular outpatient   LUTS  BPH S/p  foley on admission Foley now removed, voiding successful. Needs urology f/u outpatient Flomax, bethanecol  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 Celexa 20 mg, seroquel 100 mg BID  Systolic  Diastolic HF Appears compensated, follow  COVID 19 Virus infection S/p remdesivir/decadron  DVT prophylaxis: SCD Code Status: dnr Family Communication: None at bedside. Disposition:   Status is: Inpatient  Remains inpatient appropriate because:Inpatient level of care appropriate due to severity of illness  Dispo:  Patient From:  home  Planned Disposition: SNF  Medically stable for discharge:           Consultants:  vascular  Procedures:  LE [place] Summary:  RIGHT:  - There is no evidence of deep vein thrombosis in the lower extremity.     - No cystic structure found in the popliteal fossa.     - Incidental: Extensive fluid surrounding stent at popliteal fossa. Flow  is identified within the stent at the level of the popliteal.   - Ultrasound characteristics of enlarged lymph nodes are noted in the  groin.     LEFT:  - There is no evidence of deep vein thrombosis in the lower extremity.     - No cystic structure found in the popliteal fossa.      Antimicrobials:  Anti-infectives (From admission, onward)    Start     Dose/Rate Route Frequency Ordered Stop   07/04/21 2200  vancomycin (VANCOCIN) IVPB 1000 mg/200 mL premix  Status:  Discontinued        1,000 mg 200 mL/hr over 60 Minutes Intravenous Every 12 hours 07/04/21 0906 07/05/21 0802   07/04/21 1000  vancomycin (VANCOREADY) IVPB 1500 mg/300 mL        1,500 mg 150 mL/hr over 120 Minutes Intravenous  Once 07/04/21 0906 07/04/21 1348   07/04/21 1000  ceFEPIme (MAXIPIME) 2 g in sodium chloride 0.9 % 100 mL IVPB        2 g 200 mL/hr over 30 Minutes Intravenous Every 8 hours 07/04/21 0906 08/06/21 2359   07/02/21 2200  ceFAZolin (ANCEF) IVPB 1 g/50 mL premix  Status:  Discontinued         1 g 100 mL/hr over 30 Minutes Intravenous Every 8 hours 07/02/21 1816 07/04/21 0822   06/17/21 1400  ceFEPIme (MAXIPIME) 2 g in sodium chloride 0.9 % 100 mL IVPB        2 g 200 mL/hr over 30 Minutes Intravenous Every 12 hours 06/17/21 1258 06/19/21 0955   06/17/21 1000  remdesivir 100 mg in sodium chloride 0.9 % 100 mL IVPB       See Hyperspace for full Linked Orders Report.   100 mg 200 mL/hr over 30 Minutes Intravenous Daily 06/16/21 0135 06/18/21 1026   06/16/21 2200  cefTRIAXone (ROCEPHIN) 2 g in sodium chloride 0.9 % 100 mL IVPB  Status:  Discontinued        2 g 200 mL/hr over 30 Minutes Intravenous Every 24 hours 06/15/21 2304 06/17/21 1218   06/16/21 2200  vancomycin (VANCOREADY) IVPB 1000 mg/200 mL  Status:  Discontinued        1,000 mg 200 mL/hr over 60 Minutes Intravenous Every 12 hours 06/16/21 0820 06/17/21 1218   06/16/21 0915  vancomycin (VANCOREADY) IVPB 1750 mg/350 mL  1,750 mg 175 mL/hr over 120 Minutes Intravenous  Once 06/16/21 0820 06/16/21 1114   06/16/21 0230  remdesivir 200 mg in sodium chloride 0.9% 250 mL IVPB       See Hyperspace for full Linked Orders Report.   200 mg 580 mL/hr over 30 Minutes Intravenous Once 06/16/21 0135 06/16/21 0324   06/15/21 2230  linezolid (ZYVOX) IVPB 600 mg  Status:  Discontinued        600 mg 300 mL/hr over 60 Minutes Intravenous Every 12 hours 06/15/21 2228 06/16/21 0811   06/15/21 2115  cefTRIAXone (ROCEPHIN) 2 g in sodium chloride 0.9 % 100 mL IVPB        2 g 200 mL/hr over 30 Minutes Intravenous  Once 06/15/21 2114 06/15/21 2328          Subjective: Patient seen and examined.  Fully alert and oriented.  No complaints.  Does not like the idea of going to SNF however after counseling, he is agreeable.  Objective: Vitals:   07/09/21 2048 07/10/21 0014 07/10/21 0329 07/10/21 0523  BP: (!) 147/73 132/66 138/63   Pulse: 62 (!) 54 67   Resp: 13 16 12    Temp: 98.9 F (37.2 C) 98.4 F (36.9 C) 98.4 F (36.9  C)   TempSrc: Oral Oral Oral   SpO2: 98% 100% 98%   Weight:    74.7 kg  Height:        Intake/Output Summary (Last 24 hours) at 07/10/2021 0952 Last data filed at 07/10/2021 0654 Gross per 24 hour  Intake 1500 ml  Output 2900 ml  Net -1400 ml    Filed Weights   07/07/21 0526 07/08/21 0435 07/10/21 0523  Weight: 74.6 kg 76.6 kg 74.7 kg    Examination:  General exam: Appears calm and comfortable  Respiratory system: Clear to auscultation. Respiratory effort normal. Cardiovascular system: S1 & S2 heard, RRR. No JVD, murmurs, rubs, gallops or clicks. No pedal edema. Gastrointestinal system: Abdomen is nondistended, soft and nontender. No organomegaly or masses felt. Normal bowel sounds heard. Central nervous system: Alert and oriented. No focal neurological deficits. Extremities: Right AKA Skin: No rashes, lesions or ulcers.  Psychiatry: Judgement and insight appear normal. Mood & affect appropriate.     Data Reviewed: I have personally reviewed following labs and imaging studies  CBC: Recent Labs  Lab 07/06/21 0545 07/07/21 0056 07/08/21 0519 07/09/21 0044 07/09/21 1111 07/10/21 0500  WBC 20.7* 18.8* 15.4* 21.6* 19.6* 17.2*  NEUTROABS 17.5* 14.9* 11.6* 18.7*  --  13.6*  HGB 9.4* 9.0* 9.6* 8.6* 8.3* 8.4*  HCT 29.2* 28.3* 30.3* 27.9* 26.3* 26.1*  MCV 82.0 81.8 83.5 86.1 83.2 82.6  PLT 334 360 453* 475* 472* 503*     Basic Metabolic Panel: Recent Labs  Lab 07/06/21 0545 07/07/21 0056 07/08/21 0519 07/09/21 0044 07/09/21 1111 07/10/21 0500  NA 136 136 136 134* 137 136  K 3.9 4.3 4.0 4.7 3.9 3.9  CL 106 108 106 104 105 105  CO2 24 23 22 26 28 26   GLUCOSE 105* 108* 113* 154* 94 109*  BUN 15 12 8 10 9 8   CREATININE 0.77 0.65 0.59* 0.70 0.69 0.68  CALCIUM 8.3* 8.0* 8.4* 8.2* 8.4* 8.4*  MG 2.0 2.0 1.9 2.0  --  2.0  PHOS 2.4* 2.4* 2.9 3.1  --  3.1     GFR: Estimated Creatinine Clearance: 93.4 mL/min (by C-G formula based on SCr of 0.68 mg/dL).  Liver  Function Tests: Recent Labs  Lab 07/06/21  1027 07/07/21 0056 07/08/21 0519 07/09/21 0044 07/10/21 0500  AST 147* 90* 51* 62* 49*  ALT 391* 285* 213* 163* 140*  ALKPHOS 235* 189* 164* 140* 135*  BILITOT 1.3* 0.9 0.7 0.5 0.8  PROT 5.5* 5.5* 6.0* 5.5* 5.8*  ALBUMIN 1.9* 1.9* 2.0* 2.1* 2.1*     CBG: Recent Labs  Lab 07/04/21 1721  GLUCAP 128*      Recent Results (from the past 240 hour(s))  Aerobic Culture w Gram Stain (superficial specimen)     Status: None   Collection Time: 07/02/21  4:13 PM   Specimen: PATH Other; Tissue  Result Value Ref Range Status   Specimen Description WOUND RIGHT LEG  Final   Special Requests BYPASS  GRAFT PT ON ANCEF  Final   Gram Stain   Final    MODERATE WBC PRESENT,BOTH PMN AND MONONUCLEAR NO ORGANISMS SEEN Performed at [facility], [address] [place], [place] [address]    Culture MODERATE PSEUDOMONAS AERUGINOSA  Final   Report Status 07/05/2021 FINAL  Final   Organism ID, Bacteria PSEUDOMONAS AERUGINOSA  Final      Susceptibility   Pseudomonas aeruginosa - MIC*    CEFTAZIDIME 2 SENSITIVE Sensitive     CIPROFLOXACIN <=0.25 SENSITIVE Sensitive     GENTAMICIN <=1 SENSITIVE Sensitive     IMIPENEM 1 SENSITIVE Sensitive     PIP/TAZO <=4 SENSITIVE Sensitive     CEFEPIME 2 SENSITIVE Sensitive     * MODERATE PSEUDOMONAS AERUGINOSA  Culture, blood (routine x 2)     Status: Abnormal   Collection Time: 07/04/21  8:49 AM   Specimen: BLOOD LEFT ARM  Result Value Ref Range Status   Specimen Description BLOOD LEFT ARM  Final   Special Requests   Final    BOTTLES DRAWN AEROBIC ONLY Blood Culture adequate volume   Culture  Setup Time   Final    GRAM NEGATIVE RODS AEROBIC BOTTLE ONLY CRITICAL VALUE NOTED.  VALUE IS CONSISTENT WITH PREVIOUSLY REPORTED AND CALLED VALUE.    Culture (A)  Final    PSEUDOMONAS AERUGINOSA SUSCEPTIBILITIES PERFORMED ON PREVIOUS CULTURE WITHIN THE LAST 5 DAYS. Performed at [facility], [address] [place], [place] [address]    Report Status 07/08/2021 FINAL  Final  Culture, blood (routine x 2)     Status: Abnormal   Collection Time: 07/04/21  8:53 AM   Specimen: BLOOD RIGHT HAND  Result Value Ref Range Status   Specimen Description BLOOD RIGHT HAND  Final   Special Requests   Final    BOTTLES DRAWN AEROBIC ONLY Blood Culture results may not be optimal due to an inadequate volume of blood received in culture bottles   Culture  Setup Time   Final    GRAM NEGATIVE RODS AEROBIC BOTTLE ONLY CRITICAL RESULT CALLED TO, READ BACK BY AND VERIFIED WITH: Dorthy Cooler [id number] 07/05/2021 Girtha Hake Performed at [facility], [address], [place], [place] [address]    Culture PSEUDOMONAS AERUGINOSA (A)  Final   Report Status 07/08/2021 FINAL  Final   Organism ID, Bacteria PSEUDOMONAS AERUGINOSA  Final      Susceptibility   Pseudomonas aeruginosa - MIC*    CEFTAZIDIME 4 SENSITIVE Sensitive     CIPROFLOXACIN <=0.25 SENSITIVE Sensitive     GENTAMICIN <=1 SENSITIVE Sensitive     IMIPENEM 1 SENSITIVE Sensitive     PIP/TAZO <=4 SENSITIVE Sensitive     CEFEPIME 2 SENSITIVE Sensitive     *  PSEUDOMONAS AERUGINOSA  Blood Culture ID Panel (Reflexed)     Status: Abnormal   Collection Time: 07/04/21  8:53 AM  Result Value Ref Range Status   Enterococcus faecalis NOT DETECTED NOT DETECTED Final   Enterococcus Faecium NOT DETECTED NOT DETECTED Final   Listeria monocytogenes NOT DETECTED NOT DETECTED Final   Staphylococcus species NOT DETECTED NOT DETECTED Final   Staphylococcus aureus (BCID) NOT DETECTED NOT DETECTED Final   Staphylococcus epidermidis NOT DETECTED NOT DETECTED Final   Staphylococcus lugdunensis NOT DETECTED NOT DETECTED Final   Streptococcus species NOT DETECTED NOT DETECTED Final   Streptococcus agalactiae NOT DETECTED NOT DETECTED Final   Streptococcus pneumoniae NOT DETECTED NOT DETECTED Final   Streptococcus pyogenes NOT DETECTED NOT DETECTED Final    A.calcoaceticus-baumannii NOT DETECTED NOT DETECTED Final   Bacteroides fragilis NOT DETECTED NOT DETECTED Final   Enterobacterales NOT DETECTED NOT DETECTED Final   Enterobacter cloacae complex NOT DETECTED NOT DETECTED Final   Escherichia coli NOT DETECTED NOT DETECTED Final   Klebsiella aerogenes NOT DETECTED NOT DETECTED Final   Klebsiella oxytoca NOT DETECTED NOT DETECTED Final   Klebsiella pneumoniae NOT DETECTED NOT DETECTED Final   Proteus species NOT DETECTED NOT DETECTED Final   Salmonella species NOT DETECTED NOT DETECTED Final   Serratia marcescens NOT DETECTED NOT DETECTED Final   Haemophilus influenzae NOT DETECTED NOT DETECTED Final   Neisseria meningitidis NOT DETECTED NOT DETECTED Final   Pseudomonas aeruginosa DETECTED (A) NOT DETECTED Final    Comment: CRITICAL RESULT CALLED TO, READ BACK BY AND VERIFIED WITH: J. LEDFORD,PHARMD [id number] 07/05/2021 T. TYSOR    Stenotrophomonas maltophilia NOT DETECTED NOT DETECTED Final   Candida albicans NOT DETECTED NOT DETECTED Final   Candida auris NOT DETECTED NOT DETECTED Final   Candida glabrata NOT DETECTED NOT DETECTED Final   Candida krusei NOT DETECTED NOT DETECTED Final   Candida parapsilosis NOT DETECTED NOT DETECTED Final   Candida tropicalis NOT DETECTED NOT DETECTED Final   Cryptococcus neoformans/gattii NOT DETECTED NOT DETECTED Final   CTX-M ESBL NOT DETECTED NOT DETECTED Final   Carbapenem resistance IMP NOT DETECTED NOT DETECTED Final   Carbapenem resistance KPC NOT DETECTED NOT DETECTED Final   Carbapenem resistance NDM NOT DETECTED NOT DETECTED Final   Carbapenem resistance VIM NOT DETECTED NOT DETECTED Final    Comment: Performed at [facility], [address] [place], [place] [address]  Urine Culture     Status: None   Collection Time: 07/04/21 11:25 PM   Specimen: Urine, Catheterized  Result Value Ref Range Status   Specimen Description URINE, CATHETERIZED  Final   Special Requests NONE  Final    Culture   Final    NO GROWTH Performed at [facility], [address] [place], [place] [address]    Report Status 07/06/2021 FINAL  Final  Culture, blood (routine x 2)     Status: None   Collection Time: 07/05/21  4:37 PM   Specimen: BLOOD LEFT HAND  Result Value Ref Range Status   Specimen Description BLOOD LEFT HAND  Final   Special Requests   Final    BOTTLES DRAWN AEROBIC ONLY Blood Culture adequate volume   Culture   Final    NO GROWTH 5 DAYS Performed at [facility], [address] [place], [place] [address]    Report Status 07/10/2021 FINAL  Final  Culture, blood (routine x 2)     Status: None   Collection Time: 07/05/21  4:37 PM   Specimen: BLOOD  Result Value Ref Range Status   Specimen Description BLOOD LEFT ANTECUBITAL  Final   Special Requests   Final    BOTTLES DRAWN AEROBIC ONLY Blood Culture results may not be optimal due to an inadequate volume of blood received in culture bottles   Culture   Final    NO GROWTH 5 DAYS Performed at [facility], [address], [place], [place] [address]    Report Status 07/10/2021 FINAL  Final  Anaerobic culture w Gram Stain     Status: None (Preliminary result)   Collection Time: 07/08/21  6:09 PM   Specimen: Hemodialysis Graft; Other  Result Value Ref Range Status   Specimen Description HEMODIALYSIS GRAFT  Final   Special Requests RT FEM POP GRAFT  Final   Gram Stain   Final    ABUNDANT WBC PRESENT, PREDOMINANTLY PMN NO ORGANISMS SEEN Performed at [facility], [address] [place], [place] [address]    Culture PENDING  Incomplete   Report Status PENDING  Incomplete  Aerobic Culture w Gram Stain (superficial specimen)     Status: None (Preliminary result)   Collection Time: 07/08/21  6:09 PM   Specimen: Hemodialysis Graft  Result Value Ref Range Status   Specimen Description HEMODIALYSIS GRAFT  Final   Special Requests RT FEM POP GRAFT  Final   Gram Stain PENDING  Incomplete   Culture    Final    CULTURE REINCUBATED FOR BETTER GROWTH Performed at [facility], [address], [place], [place] [address]    Report Status PENDING  Incomplete          Radiology Studies: DG Skull 1-3 Views  Result Date: 07/10/2021 CLINICAL DATA:  Screening for MRI. EXAM: SKULL - 1-3 VIEW COMPARISON:  Previous head CT FINDINGS: Previous left parietal craniotomy has a good appearance. No intracranial metallic structure is identified. No contraindicating feature for MRI. IMPRESSION: Previous left parietal craniotomy.  No contraindicating feature. Electronically Signed   By: Paulina Fusi M.D.   On: 07/10/2021 08:18   DG Abd 1 View  Result Date: 07/10/2021 CLINICAL DATA:  Screening for MRI EXAM: ABDOMEN - 1 VIEW COMPARISON:  None. FINDINGS: Left side of the abdomen is not included. Upper abdomen is not included. No radiopaque foreign object seen in the region visible. Surgical clips in the right groin related to previous vascular procedure. IMPRESSION: No contraindicating finding in the region visible. The entire abdomen is not excluded. Electronically Signed   By: Paulina Fusi M.D.   On: 07/10/2021 08:18   MR BRAIN WO CONTRAST  Result Date: 07/10/2021 CLINICAL DATA:  Recent peripheral vascular procedure. Readmission with altered mental status, acute kidney injury in metabolic encephalopathy. EXAM: MRI HEAD WITHOUT CONTRAST TECHNIQUE: Multiplanar, multiecho pulse sequences of the brain and surrounding structures were obtained without intravenous contrast. COMPARISON:  Head CT 06/15/2021 FINDINGS: Brain: Diffusion imaging does not show any acute or subacute infarction. Mild chronic small-vessel change affects the pons. No focal cerebellar insult. Cerebral hemispheres show generalized atrophy with pronounced chronic small-vessel ischemic changes of the white matter. No cortical or large vessel territory infarction. No mass lesion or hemorrhage. Bilateral subdural hygromas appears similar, 8-9 mm  thick on the right and 4-5 mm thick on the left. No significant mass effect. Vascular: Major vessels at the base of the brain show flow. Skull and upper cervical spine: Previous left parietal craniotomy. Sinuses/Orbits: Clear except for small amount of fluid  in the right maxillary sinus. Orbits negative. Other: None IMPRESSION: No acute or reversible finding. Chronic simple appearing subdural hygromas, 8-9 mm thick on the right and 4-5 mm thick on the left, without significant mass-effect. Extensive chronic small-vessel ischemic changes affecting the brain, particularly affecting the cerebral hemispheric white matter. Some fluid layering in the right maxillary sinus. Similar to previous studies. Electronically Signed   By: Paulina Fusi M.D.   On: 07/10/2021 08:17   [place] EKG SITE RITE  Result Date: 07/09/2021 If Site Rite image not attached, placement could not be confirmed due to current cardiac rhythm.       Scheduled Meds:  sodium chloride   Intravenous Once   sodium chloride   Intravenous Once   aspirin EC  81 mg Oral Daily   bethanechol  10 mg Oral TID   Chlorhexidine Gluconate Cloth  6 each Topical Daily   citalopram  20 mg Oral Daily   clopidogrel  75 mg Oral Daily   diphenhydrAMINE  25 mg Oral Once   ferrous fumarate-b12-vitamic C-folic acid  1 capsule Oral Daily   gabapentin  100 mg Oral Daily   gabapentin  300 mg Oral QHS   melatonin  10 mg Oral QHS   metoprolol tartrate  12.5 mg Oral BID   multivitamin with minerals  1 tablet Oral Daily   nicotine  7 mg Transdermal Daily   pantoprazole  40 mg Oral QPM   QUEtiapine  100 mg Oral BID   senna-docusate  1 tablet Oral BID   sodium chloride flush  10-40 mL Intracatheter Q12H   tamsulosin  0.8 mg Oral QPM   Continuous Infusions:  ceFEPime (MAXIPIME) IV 2 g (07/10/21 0515)   lactated ringers 125 mL/hr at 07/10/21 0654     LOS: 24 days    Time spent: 35 min  Hughie Closs, MD [facility]  To contact the attending  provider between 7A-7P or the covering provider during after hours 7P-7A, please log into the web site [email or web address] and access using universal [place] password for that web site. If you do not have the password, please call the hospital operator.  07/10/2021, 9:52 AM

## 2021-07-10 NOTE — Progress Notes (Signed)
Inpatient Rehab Admissions Coordinator Note:   At wife's request, pt was screened for CIR candidacy by Megan Salon, MS CCC-SLP. Pt. Was originally screened on 07/03/21 and I did not pursue consult due to therapy recommending SNF. Both PT and OT  continue to recommend SNF  It is also the case that [facility] Medicare typically will not approve CIR for amputations. I will not pursue this Pt for CIR admission.    Megan Salon, MS, CCC-SLP Rehab Admissions Coordinator  [phone] (celll) [phone] (office)

## 2021-07-10 NOTE — Progress Notes (Signed)
  Progress Note    07/10/2021 12:14 PM 2 Days Post-Op  Subjective:  no complaints. Says his pain is a 1/10   Vitals:   07/10/21 0329 07/10/21 1149  BP: 138/63 132/65  Pulse: 67 67  Resp: 12 14  Temp: 98.4 F (36.9 C) 98.4 F (36.9 C)  SpO2: 98% 98%   Physical Exam: Cardiac:  regular Lungs:  non  labored Incisions:  bilateral groin incisions well appearing, staples intact, no swelling bleeding or hematoma. R AKA incision c/d/i Abdomen: soft, non tender Neurologic: alert and oriented  CBC    Component Value Date/Time   WBC 17.2 (H) 07/10/2021 0500   RBC 3.16 (L) 07/10/2021 0500   HGB 8.4 (L) 07/10/2021 0500   HGB 15.7 01/10/2020 1146   HCT 26.1 (L) 07/10/2021 0500   HCT 46.1 01/10/2020 1146   PLT 503 (H) 07/10/2021 0500   PLT 232 01/10/2020 1146   MCV 82.6 07/10/2021 0500   MCV 83 01/10/2020 1146   MCH 26.6 07/10/2021 0500   MCHC 32.2 07/10/2021 0500   RDW 21.6 (H) 07/10/2021 0500   RDW 12.8 01/10/2020 1146   LYMPHSABS 1.6 07/10/2021 0500   LYMPHSABS 2.5 03/01/2015 1414   MONOABS 1.2 (H) 07/10/2021 0500   EOSABS 0.4 07/10/2021 0500   EOSABS 0.4 03/01/2015 1414   BASOSABS 0.1 07/10/2021 0500   BASOSABS 0.1 03/01/2015 1414    BMET    Component Value Date/Time   NA 136 07/10/2021 0500   NA 142 01/10/2020 1146   K 3.9 07/10/2021 0500   CL 105 07/10/2021 0500   CO2 26 07/10/2021 0500   GLUCOSE 109 (H) 07/10/2021 0500   BUN 8 07/10/2021 0500   BUN 19 01/10/2020 1146   CREATININE 0.68 07/10/2021 0500   CREATININE 0.94 05/24/2021 1549   CALCIUM 8.4 (L) 07/10/2021 0500   GFRNONAA >60 07/10/2021 0500   GFRAA >60 09/12/2020 0357    INR    Component Value Date/Time   INR 1.3 (H) 07/04/2021 0915     Intake/Output Summary (Last 24 hours) at 07/10/2021 1214 Last data filed at 07/10/2021 0654 Gross per 24 hour  Intake 1500 ml  Output 2900 ml  Net -1400 ml     Assessment/Plan:  70 y.o. male is s/p removal of infected RLE bypass graft 2 Days Post-Op    B Groins well appearing, healing well so far R AKA looks good. Viable flaps Pain is better controlled PICC line placed ID recommending 4 weeks of cefepime followed by oral cipro x 3-6 months Outpatient follow up with ID arranged PT/OT recommend SNF Pt wanting CIR but not candidate Okay for discharge from vascular standpoint will follow up for staple removal in our office in 3-4 weeks   DVT prophylaxis:  SCDs   Graceann Congress, PA-C [facility] [phone] 07/10/2021 12:14 PM

## 2021-07-11 DIAGNOSIS — G9341 Metabolic encephalopathy: Secondary | ICD-10-CM | POA: Diagnosis not present

## 2021-07-11 LAB — AEROBIC CULTURE W GRAM STAIN (SUPERFICIAL SPECIMEN)

## 2021-07-11 LAB — SARS CORONAVIRUS 2 (TAT 6-24 HRS): SARS Coronavirus 2: NEGATIVE

## 2021-07-11 NOTE — Care Management Important Message (Signed)
Important Message  Patient Details  Name: Lonnie [name] MRN: [id number] Date of Birth: [birth year]   Medicare Important Message Given:  Yes Pt. On contact precautions will leave with pt. nurse.    Sloan Leiter Smith 07/11/2021, 11:02 AM

## 2021-07-11 NOTE — Progress Notes (Signed)
Physical Therapy Treatment Patient Details Name: Lonnie [name] MRN: [id number] DOB: 02-12-1951 Today's Date: 07/11/2021    History of Present Illness Lonnie [name] is a 70 y.o. male who underwent peripheral vascular thrombectomy of R LE and graft placement 6/17 and discharged to home. Pt readmitted 6/25 due to AMS, AKI, metabolic encephalopathy. He was found to be COVID+. He underwent exploration of R below knee popliteal wound and noted with acute ischemic wounds and gangrene R foot as well as graft infection. 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 Patient s/p complete graft removal 7/18. There was a vein patch with prolene suture PMH: dementia, PAD, tobacco use disorder, chronic depression/anxiety, impaired vision, iron deficiency anemia, peripheral neuropathy, essential HTN, HLD, GERD, BPH, falls, SDH s/p crani (2020).    PT Comments    Pt received in supine, agreeable to therapy session and with good participation and tolerance for transfer training and seated strengthening exercises. Pt motivated and communicated well this session, remains more impulsive with standing attempts due to frustration (likely due to RLE pain). Encouraged pt to sit up in chair at least 1 hour, chair alarm on for safety and RN aware. Plan to initiate wheelchair mobility training next session if appropriate. Pt continues to benefit from PT services to progress toward functional mobility goals.    Follow Up Recommendations  SNF;Supervision/Assistance - 24 hour     Equipment Recommendations  None recommended by PT    Recommendations for Other Services       Precautions / Restrictions Precautions Precautions: Fall Precaution Comments: R AKA Restrictions Weight Bearing Restrictions: Yes RLE Weight Bearing: Non weight bearing    Mobility  Bed Mobility Overal bed mobility: Needs Assistance Bed Mobility: Supine to Sit     Supine to sit: Supervision      General bed mobility comments: no assist needed, able to perform with flat HOB no rails with increased time and cues to scoot hips forward    Transfers Overall transfer level: Needs assistance Equipment used: Rolling walker (2 wheeled) Transfers: Sit to/from Stand Sit to Stand: Min assist;+2 safety/equipment         General transfer comment: Pt +74minA to stand from EOB initially to [place], pt impulsively tried to sit prior to both Stedy flaps being in place, was irritable when encouraged to stand upright again but able to perform. +2 minA to guide safely back to recliner seat after standing again within [place]. Deferred RW transfers due to pt increased pain/impulsivity.  Ambulation/Gait                 Stairs             Wheelchair Mobility    Modified Rankin (Stroke Patients Only)       Balance Overall balance assessment: Needs assistance Sitting-balance support: Feet supported Sitting balance-Leahy Scale: Fair Sitting balance - Comments: static sitting EOB no LOB Postural control: Posterior lean Standing balance support: Bilateral upper extremity supported Standing balance-Leahy Scale: Poor (poor to zero) Standing balance comment: reliant on BUE support due to R AKA + external support, impulsive to sit immediately upon standing.                            Cognition Arousal/Alertness: Awake/alert Behavior During Therapy: WFL for tasks assessed/performed Overall Cognitive Status: History of cognitive impairments - at baseline Area of Impairment: Safety/judgement;Following commands;Problem solving;Memory;Attention  Current Attention Level: Sustained Memory: Decreased short-term memory Following Commands: Follows one step commands with increased time;Follows multi-step commands inconsistently Safety/Judgement: Decreased awareness of safety;Decreased awareness of deficits Awareness: Emergent Problem Solving: Slow  processing;Difficulty sequencing;Requires verbal cues;Requires tactile cues General Comments: Hx of dementia, increased time for following directions, safety and awareness (per chart review, delayed processing post-SDH and craniotomy in 2020). While pt is very pleasant with sitting, once up in weightbearing he becomes more agitated and impulsive, requiring increased multimodal cuing for safety. Used Stedy lift for safety with transfers due to pt impulsivity in stance.      Exercises Other Exercises Other Exercises: LLE AROM: hip flexion, LAQ, ankle pumps x15 reps ea Other Exercises: chair push-ups (BUE and LLE) x5 reps Other Exercises: STS x 2 (to [place])    General Comments General comments (skin integrity, edema, etc.): pt c/o dizziness upon sitting up EOB and after transfer to chair, BP 145/65 seated pre-mobility and BP 134/66 seated post-pivot transfer; VSS on RA      Pertinent Vitals/Pain Pain Assessment: Faces Faces Pain Scale: Hurts whole lot Pain Location: R residual limb (after premedication) Pain Descriptors / Indicators: Grimacing;Guarding;Sore;Moaning Pain Intervention(s): Limited activity within patient's tolerance;Monitored during session;Premedicated before session;Repositioned    Home Living                      Prior Function            PT Goals (current goals can now be found in the care plan section) Acute Rehab PT Goals Patient Stated Goal: go home PT Goal Formulation: With patient Time For Goal Achievement: 07/17/21 Progress towards PT goals: Progressing toward goals    Frequency    Min 2X/week      PT Plan Current plan remains appropriate    Co-evaluation              AM-PAC PT "6 Clicks" Mobility   Outcome Measure  Help needed turning from your back to your side while in a flat bed without using bedrails?: None Help needed moving from lying on your back to sitting on the side of a flat bed without using bedrails?: A Little Help  needed moving to and from a bed to a chair (including a wheelchair)?: A Lot Help needed standing up from a chair using your arms (e.g., wheelchair or bedside chair)?: A Lot Help needed to walk in hospital room?: Total Help needed climbing 3-5 steps with a railing? : Total 6 Click Score: 13    End of Session Equipment Utilized During Treatment: Gait belt Activity Tolerance: Patient tolerated treatment well;Patient limited by pain Patient left: in chair;with call bell/phone within reach;with chair alarm set (pt set up to eat lunch, encouraged to sit up 1 hour in chair and pt doubtful he would tolerate >30 mins) Nurse Communication: Mobility status;Need for lift equipment;Other (comment) (may need slide board to transfer back to bed, mobility tech can help if needed; slide board left in room) PT Visit Diagnosis: Other abnormalities of gait and mobility (R26.89);Muscle weakness (generalized) (M62.81);Pain;Difficulty in walking, not elsewhere classified (R26.2);Unsteadiness on feet (R26.81) Pain - Right/Left: Right Pain - part of body: Leg     Time: 1320-1350 PT Time Calculation (min) (ACUTE ONLY): 30 min  Charges:  $Therapeutic Exercise: 8-22 mins $Therapeutic Activity: 8-22 mins                     Coron Rossano P., PTA Acute Rehabilitation Services Pager: [phone] Office: [phone]  Dorathy Kinsman Marcell Chavarin 07/11/2021, 3:38 PM

## 2021-07-11 NOTE — TOC Progression Note (Addendum)
Transition of Care [facility]) - Progression Note    Patient Details  Name: Lonnie [name] MRN: [id number] Date of Birth: February 19, 1951  Transition of Care [facility]) CM/SW Contact  Eduard Roux, [place] Phone Number: 07/11/2021, 3:36 PM  Clinical Narrative:     CSW informed by MD patient is medically stable for discharge. CSW confirmed placement with [facility]. CSW started Fish farm manager # [phone] and requested covid test w/MD.  CSW contacted patient's wife and informed of anticipated discharge tomorrow to [facility]. Patient wife begins to express she does not believe the patient is stable for discharge, "he has a lot going on" and he could absolutely not be discharged tomorrow. She inquired about other medical concerns regarding his left leg- CSW informed medical questions needs to be deferred to nurse or MD. She asked for MD to give her a call, then states she was getting upset and was unable to continue to talk with CSW. She was unable to "deal with this right now and was too upset to continue to talk". CSW requested MD  to call patient's wife to address her concerns. He did and had to leave voice message. CSW asked MD to call patient's daughter, Lonnie [name], since she assist with patient's care and wife indicated she was too upset to talk. Per MD( as noted), patient's daughter got upset and was not agreeable to discharge either.  CSW had made [facility] Supervisor [place] aware of situation. Patient's daughter explained she has spoken with [employer] Consulting civil engineer about concerns and really wants to make sure the patient is actually ready for discharged and not "just getting pushed out the door". Patient's daughter is waiting to hear back from Consulting civil engineer. TOC Supervisor informed CSW will continue to follow and will reach out to her tomorrow.  CSW spoke the Charge RN/Hanna- she advised she will call daughter back and give her patient experience number. She has reached out to vascular and  informed them of family's concerns.   CSW will continue to follow and assist with discharge planning.  Antony Blackbird, MSW, LCSW Clinical Social Worker     Barriers to Discharge: Continued Medical Work up  Expected Discharge Plan and Services                                                 Social Determinants of Health (SDOH) Interventions    Readmission Risk Interventions Readmission Risk Prevention Plan 06/14/2021 09/12/2020 03/12/2020  Post Dischage Appt Complete - -  Medication Screening Complete - -  Transportation Screening Complete Complete -  PCP or Specialist Appt within 3-5 Days - Complete -  HRI or Home Care Consult - Complete -  Social Work Consult for Recovery Care Planning/Counseling - Complete -  Palliative Care Screening - Not Applicable -  Medication Review Oceanographer) - Complete Complete  Some recent data might be hidden

## 2021-07-11 NOTE — Progress Notes (Addendum)
  Progress Note    07/11/2021 7:35 AM 3 Days Post-Op  Subjective:  no complaints this morning   Vitals:   07/11/21 0642 07/11/21 0726  BP:  (!) 152/77  Pulse: 63 63  Resp: 13 16  Temp:  98.5 F (36.9 C)  SpO2: 97% 97%   Physical Exam: Lungs:  non labored Incisions:  groin incisions c/d/I; R AKA incision also healing well Abdomen:  soft Neurologic: A&O  CBC    Component Value Date/Time   WBC 17.2 (H) 07/10/2021 0500   RBC 3.16 (L) 07/10/2021 0500   HGB 8.4 (L) 07/10/2021 0500   HGB 15.7 01/10/2020 1146   HCT 26.1 (L) 07/10/2021 0500   HCT 46.1 01/10/2020 1146   PLT 503 (H) 07/10/2021 0500   PLT 232 01/10/2020 1146   MCV 82.6 07/10/2021 0500   MCV 83 01/10/2020 1146   MCH 26.6 07/10/2021 0500   MCHC 32.2 07/10/2021 0500   RDW 21.6 (H) 07/10/2021 0500   RDW 12.8 01/10/2020 1146   LYMPHSABS 1.6 07/10/2021 0500   LYMPHSABS 2.5 03/01/2015 1414   MONOABS 1.2 (H) 07/10/2021 0500   EOSABS 0.4 07/10/2021 0500   EOSABS 0.4 03/01/2015 1414   BASOSABS 0.1 07/10/2021 0500   BASOSABS 0.1 03/01/2015 1414    BMET    Component Value Date/Time   NA 136 07/10/2021 0500   NA 142 01/10/2020 1146   K 3.9 07/10/2021 0500   CL 105 07/10/2021 0500   CO2 26 07/10/2021 0500   GLUCOSE 109 (H) 07/10/2021 0500   BUN 8 07/10/2021 0500   BUN 19 01/10/2020 1146   CREATININE 0.68 07/10/2021 0500   CREATININE 0.94 05/24/2021 1549   CALCIUM 8.4 (L) 07/10/2021 0500   GFRNONAA >60 07/10/2021 0500   GFRAA >60 09/12/2020 0357    INR    Component Value Date/Time   INR 1.3 (H) 07/04/2021 0915     Intake/Output Summary (Last 24 hours) at 07/11/2021 0735 Last data filed at 07/11/2021 0600 Gross per 24 hour  Intake 3400.82 ml  Output 3200 ml  Net 200.82 ml     Assessment/Plan:  70 y.o. male is s/p R AKA with subsequent removal of infected bypass 3 Days Post-Op   Groin incisions healing well; continue dry dressing changes daily Retention sock ordered for R AKA PICC placed; IV  abx per ID Ok for d/c from vascular standpoint   Emilie Rutter, PA-C [facility] [phone] 07/11/2021 7:35 AM  All incisions healing no signs of ongoing infection OK for d/c from my standpoint  Fabienne Bruns, MD [facility] [place] Office: [phone]

## 2021-07-11 NOTE — Progress Notes (Signed)
Mobility Specialist: Progress Note   07/11/21 1714  Mobility  Activity Stood at bedside;Transferred to/from [facility]  Level of Assistance Minimal assist, patient does 75% or more  Assistive Device Stedy  Mobility Out of bed for toileting  Mobility Response Tolerated well  Mobility performed by Mobility specialist  $Mobility charge 1 Mobility   Pre-Mobility: 68 HR, 130/55 BP, 99% SpO2 Post-Mobility: 81 HR  Pt stood x7 at bedside using Stedy. Pt stood for 3 minutes on the first bout before taking a break. Pt then stood x5, independent for first three and then minA to stand on last two bouts. Pt requesting to use BSC to have BM. Pt stood and transferred to [facility] using Stedy. Pt instructed to press call bell when he is finished to be assisted back to bed.   [facility] Lizandra Zakrzewski Mobility Specialist Mobility Specialist Phone: [phone]

## 2021-07-11 NOTE — Progress Notes (Signed)
PROGRESS NOTE    Lonnie [name]  QJJ:[id number] DOB: [birth year] DOA: 06/15/2021 PCP: Lonnie Plump, MD   No chief complaint on file.  Brief Narrative:  70 yo with hx CAD s/p CABG x4 2014, subdural hematoma s/p craniotomy (2020, with residual balance deficits and delayed processing), ischemic cardiomyopathy, right popliteal bypass with PTFE graft 01/23/2020, recent admission 06/07/21 with occluded R fem pop bypass s/p thrombolysis, mechanical thrombectomy of R femoropoliteal bypass graft, stent x2 of the right femoral bypass graft.  He was readmitted 6/25 with weakness and confusion and found to have bladder rentention and possible UTI (multiple species).  His covid testing was positive and he was treated with steroids and remdesivir.  He developed spontaneous bleeding on 7/6 from prior area of R femoropopliteal bypass and then large volume bleeding with hemorrhagic shock on 7/7.  He was taken to the OR by Dr. Chestine [name] on 7/7 for exploration of R below knee popliteal wound, repair of R popliteal vein, ligation of R popliteal artery, and placement of negative pressure wound vac.  He had right AKA on 7/12 with gangrene of R foot.  He developed sepsis on 7/13-14 and was found to have pseudomonas bacteremia related to his infected graft.  He's currently on cefepime.  7/18 s/p removal of infected RLE bypass graft.  ID consulted and recommending 4 weeks of cefepime, then oral cipro.  MRI brain ordered 7/19 due to word finding difficulties.    Assessment & Plan:   Active Problems:   Acute metabolic encephalopathy   AKI (acute kidney injury) (HCC)   Status post above-knee amputation of right lower extremity (HCC)   Altered mental status   Bacteremia due to Pseudomonas   Infected prosthetic vascular graft (HCC)  Acute Metabolic Encephalopathy/delirium, postoperative Developed initially last week with infection And confusion again post op on 07/09/2021- word finding difficulties and some R>L ataxia.  Ended up  having MRI brain which is negative for any acute stroke or any acute pathology.  Patient has been fully alert and oriented.  Continue delirium precautions.  Sepsis secondary to pseudomonas bacteremia Graft segment sent for culture with moderate pseudomonas 7/12 - sensitive to cefepime UA bland, CXR without acute disease RUQ [place] with mild fatty infiltration of the liver 2/2 blood cultures with pseudomonas, sensitive to cefepime Follow repeat blood cultures 07/05/2021 NGTDx3  Vascular on board.  S/p removal right femoral popliteal bypass, vein patch angiopalsty right common femoral artery using contralateral saphenous vein on 07/08/2021 ID c/s, appreciate recs -> PICC line placed.  Continue cefepime x4 weeks 7/19-8/16 (see dosing per ID note 7/19), ciprofloxaxin after 8/16 for 3-6 months for suppression Echo with EF 50%, RWMA  Elevated Liver Enzymes  Fatty Liver  Likely shock liver, improving  Hepatocellular pattern of liver injury -> continues to improve Negative acute hepatitis panel Minimize meds which could contribute to liver injury RUQ with mild fatty infiltration of liver  Acute Blood Loss Anemia with Hypovolemic Shock Occluded Right Femoropopliteal Bypass s/p lysis complicated by bleeding with disruption and reocclusion of graft S/p right above knee amputation  S/p exploration of R below knee popliteal wound, repair of R popliteal vein, ligation of right popliteal artery and placement of negative pressure wound vac on 7/7 S/p right AKA 7/12 S/p 4 units pRBC (I see 5 units ordered, only 4 documented as being completed, one as "transfusing" - unclear) Continue aspirin, plavix Gabapentin for phantom pain  Hb relatively stable  Severe PAD  CAD s/p CABG x4 Aspirin, plavix  Not currently on statin, unclear reason, will review (elevated LFT's at this time, follow, consider if improved) Continue metoprolol F/u with cardiology and vascular outpatient   LUTS  BPH S/p foley on  admission Foley now removed, voiding successful. Needs urology f/u outpatient Flomax, bethanecol  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 Celexa 20 mg, seroquel 100 mg BID  Systolic  Diastolic HF Appears compensated, follow  COVID 19 Virus infection S/p remdesivir/decadron  Family dynamics: Patient has been cleared by vascular surgery today, social worker was informed and has found a bed, COVID test ordered and plan for possible discharge tomorrow, insurance authorization in place.  Lonnie [name], social worker talk to the wife, wife is completely against discharge and she believes that patient is not medically stable yet.  I was asked to call family.  I called the wife, she kept my call and send me to the voicemail, I learned later and I left a voicemail to her.  I was then asked to call [name].  I spoke to Lonnie [name] over the phone for about 25 minutes.  All she had to say was complaints about how is that has received care at [name] during this hospitalization as well as previous hospitalization.  She also mentioned that they do not like dealing with Lon[facility] [name] our social worker and would rather like [name] on board.  Daughter believes that our social worker is trying to get her dad out of the door.  I informed her that vascular surgery has cleared the patient for discharge and patient is medically stable and thus social workers are working hard to discharge the patient to SNF as recommended by Lonnie [name].  Out of the 25 minutes, I was barely given 2 minutes to talk.  Lonnie [name] continue to talk for rest of the duration of the call.  She was extremely loud and angry during the whole conversation.  She also told me that she was told by Lon[facility] [name] that he is not medically ready and will require several days of hospitalization.  She is now shocked to hear that patient is now medically stable for discharge 2 days after she spoke to Lonnie [name].  She does not  believe that patient is medically ready.  She is also demanding that patient be seen by physical therapy for 3 hours a day and 5 days a week while he is in the [name] and should continue to remain in the [name] until he completes his antibiotic course for 4 weeks.  She also mentioned that there was some issue with the left leg vasculopathy and wanted to know what vascular surgery is doing about that.  I reviewed the whole chart and did not find any mention of left lower extremity vascular issues at all in any of the vascular surgery or hospitalist note.  I did mention to her about that as well.  She continued to persist and prolonged conversation about left leg.  I referred her to talk to vascular surgery directly.  She also mentioned that when our social worker called her mother to let her know that patient is medically discharged already, her mother became upset and immediately started having chest pain and she was still having chest pain.  Case discussed with Loss adjuster, chartered.   DVT prophylaxis: SCD Code Status: dnr Family Communication: None at bedside.  A very lengthy discussion with the daughter over the phone. Disposition:   Status is: Inpatient  Remains inpatient appropriate because:Inpatient level of care  appropriate due to severity of illness  Dispo:  Patient From:  home  Planned Disposition: SNF  Medically stable for discharge:           Consultants:  vascular  Procedures:  LE [place] Summary:  RIGHT:  - There is no evidence of deep vein thrombosis in the lower extremity.     - No cystic structure found in the popliteal fossa.     - Incidental: Extensive fluid surrounding stent at popliteal fossa. Flow  is identified within the stent at the level of the popliteal.   - Ultrasound characteristics of enlarged lymph nodes are noted in the  groin.     LEFT:  - There is no evidence of deep vein thrombosis in the lower extremity.     - No cystic structure found in the  popliteal fossa.      Antimicrobials:  Anti-infectives (From admission, onward)    Start     Dose/Rate Route Frequency Ordered Stop   07/04/21 2200  vancomycin (VANCOCIN) IVPB 1000 mg/200 mL premix  Status:  Discontinued        1,000 mg 200 mL/hr over 60 Minutes Intravenous Every 12 hours 07/04/21 0906 07/05/21 0802   07/04/21 1000  vancomycin (VANCOREADY) IVPB 1500 mg/300 mL        1,500 mg 150 mL/hr over 120 Minutes Intravenous  Once 07/04/21 0906 07/04/21 1348   07/04/21 1000  ceFEPIme (MAXIPIME) 2 g in sodium chloride 0.9 % 100 mL IVPB        2 g 200 mL/hr over 30 Minutes Intravenous Every 8 hours 07/04/21 0906 08/06/21 2359   07/02/21 2200  ceFAZolin (ANCEF) IVPB 1 g/50 mL premix  Status:  Discontinued        1 g 100 mL/hr over 30 Minutes Intravenous Every 8 hours 07/02/21 1816 07/04/21 0822   06/17/21 1400  ceFEPIme (MAXIPIME) 2 g in sodium chloride 0.9 % 100 mL IVPB        2 g 200 mL/hr over 30 Minutes Intravenous Every 12 hours 06/17/21 1258 06/19/21 0955   06/17/21 1000  remdesivir 100 mg in sodium chloride 0.9 % 100 mL IVPB       See Hyperspace for full Linked Orders Report.   100 mg 200 mL/hr over 30 Minutes Intravenous Daily 06/16/21 0135 06/18/21 1026   06/16/21 2200  cefTRIAXone (ROCEPHIN) 2 g in sodium chloride 0.9 % 100 mL IVPB  Status:  Discontinued        2 g 200 mL/hr over 30 Minutes Intravenous Every 24 hours 06/15/21 2304 06/17/21 1218   06/16/21 2200  vancomycin (VANCOREADY) IVPB 1000 mg/200 mL  Status:  Discontinued        1,000 mg 200 mL/hr over 60 Minutes Intravenous Every 12 hours 06/16/21 0820 06/17/21 1218   06/16/21 0915  vancomycin (VANCOREADY) IVPB 1750 mg/350 mL        1,750 mg 175 mL/hr over 120 Minutes Intravenous  Once 06/16/21 0820 06/16/21 1114   06/16/21 0230  remdesivir 200 mg in sodium chloride 0.9% 250 mL IVPB       See Hyperspace for full Linked Orders Report.   200 mg 580 mL/hr over 30 Minutes Intravenous Once 06/16/21 0135 06/16/21  0324   06/15/21 2230  linezolid (ZYVOX) IVPB 600 mg  Status:  Discontinued        600 mg 300 mL/hr over 60 Minutes Intravenous Every 12 hours 06/15/21 2228 06/16/21 0811   06/15/21 2115  cefTRIAXone (ROCEPHIN) 2 g in  sodium chloride 0.9 % 100 mL IVPB        2 g 200 mL/hr over 30 Minutes Intravenous  Once 06/15/21 2114 06/15/21 2328          Subjective: Patient seen and examined.  He is fully alert and oriented and has no complaints.  He is very pleasant.  Objective: Vitals:   07/11/21 0642 07/11/21 0726 07/11/21 1321 07/11/21 1344  BP:  (!) 152/77 (!) 145/65 134/66  Pulse: 63 63    Resp: Temp:  98.5 F (36.9 C) 97.8 F (36.6 C)   TempSrc:  Oral Oral   SpO2: 97% 97% 98%   Weight: 68.2 kg     Height:        Intake/Output Summary (Last 24 hours) at 07/11/2021 1453 Last data filed at 07/11/2021 1329 Gross per 24 hour  Intake 3278.82 ml  Output 5400 ml  Net -2121.18 ml    Filed Weights   07/08/21 0435 07/10/21 0523 07/11/21 0642  Weight: 76.6 kg 74.7 kg 68.2 kg    Examination:  General exam: Appears calm and comfortable  Respiratory system: Clear to auscultation. Respiratory effort normal. Cardiovascular system: S1 & S2 heard, RRR. No JVD, murmurs, rubs, gallops or clicks. No pedal edema. Gastrointestinal system: Abdomen is nondistended, soft and nontender. No organomegaly or masses felt. Normal bowel sounds heard. Central nervous system: Alert and oriented. No focal neurological deficits. Extremities: Right AKA Skin: No rashes, lesions or ulcers.  Psychiatry: Judgement and insight appear normal. Mood & affect appropriate.    Data Reviewed: I have personally reviewed following labs and imaging studies  CBC: Recent Labs  Lab 07/06/21 0545 07/07/21 0056 07/08/21 0519 07/09/21 0044 07/09/21 1111 07/10/21 0500  WBC 20.7* 18.8* 15.4* 21.6* 19.6* 17.2*  NEUTROABS 17.5* 14.9* 11.6* 18.7*  --  13.6*  HGB 9.4* 9.0* 9.6* 8.6* 8.3* 8.4*  HCT 29.2* 28.3*  30.3* 27.9* 26.3* 26.1*  MCV 82.0 81.8 83.5 86.1 83.2 82.6  PLT 334 360 453* 475* 472* 503*     Basic Metabolic Panel: Recent Labs  Lab 07/06/21 0545 07/07/21 0056 07/08/21 0519 07/09/21 0044 07/09/21 1111 07/10/21 0500  NA 136 136 136 134* 137 136  K 3.9 4.3 4.0 4.7 3.9 3.9  CL 106 108 106 104 105 105  CO2 GLUCOSE 105* 108* 113* 154* 94 109*  BUN CREATININE 0.77 0.65 0.59* 0.70 0.69 0.68  CALCIUM 8.3* 8.0* 8.4* 8.2* 8.4* 8.4*  MG 2.0 2.0 1.9 2.0  --  2.0  PHOS 2.4* 2.4* 2.9 3.1  --  3.1     GFR: Estimated Creatinine Clearance: 85.3 mL/min (by C-G formula based on SCr of 0.68 mg/dL).  Liver Function Tests: Recent Labs  Lab 07/06/21 0545 07/07/21 0056 07/08/21 0519 07/09/21 0044 07/10/21 0500  AST 147* 90* 51* 62* 49*  ALT 391* 285* 213* 163* 140*  ALKPHOS 235* 189* 164* 140* 135*  BILITOT 1.3* 0.9 0.7 0.5 0.8  PROT 5.5* 5.5* 6.0* 5.5* 5.8*  ALBUMIN 1.9* 1.9* 2.0* 2.1* 2.1*     CBG: Recent Labs  Lab 07/04/21 1721  GLUCAP 128*      Recent Results (from the past 240 hour(s))  Aerobic Culture w Gram Stain (superficial specimen)     Status: None   Collection Time: 07/02/21  4:13 PM   Specimen: PATH Other; Tissue  Result Value Ref Range Status   Specimen Description WOUND  RIGHT LEG  Final   Special Requests BYPASS  GRAFT Lonnie ON ANCEF  Final   Gram Stain   Final    MODERATE WBC PRESENT,BOTH PMN AND MONONUCLEAR NO ORGANISMS SEEN Performed at [name], [address], [place], [place] [address]    Culture MODERATE PSEUDOMONAS AERUGINOSA  Final   Report Status 07/05/2021 FINAL  Final   Organism ID, Bacteria PSEUDOMONAS AERUGINOSA  Final      Susceptibility   Pseudomonas aeruginosa - MIC*    CEFTAZIDIME 2 SENSITIVE Sensitive     CIPROFLOXACIN <=0.25 SENSITIVE Sensitive     GENTAMICIN <=1 SENSITIVE Sensitive     IMIPENEM 1 SENSITIVE Sensitive     PIP/TAZO <=4 SENSITIVE Sensitive     CEFEPIME 2 SENSITIVE  Sensitive     * MODERATE PSEUDOMONAS AERUGINOSA  Culture, blood (routine x 2)     Status: Abnormal   Collection Time: 07/04/21  8:49 AM   Specimen: BLOOD LEFT ARM  Result Value Ref Range Status   Specimen Description BLOOD LEFT ARM  Final   Special Requests   Final    BOTTLES DRAWN AEROBIC ONLY Blood Culture adequate volume   Culture  Setup Time   Final    GRAM NEGATIVE RODS AEROBIC BOTTLE ONLY CRITICAL VALUE NOTED.  VALUE IS CONSISTENT WITH PREVIOUSLY REPORTED AND CALLED VALUE.    Culture (A)  Final    PSEUDOMONAS AERUGINOSA SUSCEPTIBILITIES PERFORMED ON PREVIOUS CULTURE WITHIN THE LAST 5 DAYS. Performed at [facility], [address], [place], [place] [address]    Report Status 07/08/2021 FINAL  Final  Culture, blood (routine x 2)     Status: Abnormal   Collection Time: 07/04/21  8:53 AM   Specimen: BLOOD RIGHT HAND  Result Value Ref Range Status   Specimen Description BLOOD RIGHT HAND  Final   Special Requests   Final    BOTTLES DRAWN AEROBIC ONLY Blood Culture results may not be optimal due to an inadequate volume of blood received in culture bottles   Culture  Setup Time   Final    GRAM NEGATIVE RODS AEROBIC BOTTLE ONLY CRITICAL RESULT CALLED TO, READ BACK BY AND VERIFIED WITH: Dorthy Cooler [id number] 07/05/2021 Girtha Hake Performed at [facility], [address] [place], [place] [address]    Culture PSEUDOMONAS AERUGINOSA (A)  Final   Report Status 07/08/2021 FINAL  Final   Organism ID, Bacteria PSEUDOMONAS AERUGINOSA  Final      Susceptibility   Pseudomonas aeruginosa - MIC*    CEFTAZIDIME 4 SENSITIVE Sensitive     CIPROFLOXACIN <=0.25 SENSITIVE Sensitive     GENTAMICIN <=1 SENSITIVE Sensitive     IMIPENEM 1 SENSITIVE Sensitive     PIP/TAZO <=4 SENSITIVE Sensitive     CEFEPIME 2 SENSITIVE Sensitive     * PSEUDOMONAS AERUGINOSA  Blood Culture ID Panel (Reflexed)     Status: Abnormal   Collection Time: 07/04/21  8:53 AM  Result Value Ref Range Status    Enterococcus faecalis NOT DETECTED NOT DETECTED Final   Enterococcus Faecium NOT DETECTED NOT DETECTED Final   Listeria monocytogenes NOT DETECTED NOT DETECTED Final   Staphylococcus species NOT DETECTED NOT DETECTED Final   Staphylococcus aureus (BCID) NOT DETECTED NOT DETECTED Final   Staphylococcus epidermidis NOT DETECTED NOT DETECTED Final   Staphylococcus lugdunensis NOT DETECTED NOT DETECTED Final   Streptococcus species NOT DETECTED NOT DETECTED Final   Streptococcus agalactiae NOT DETECTED NOT DETECTED Final   Streptococcus  pneumoniae NOT DETECTED NOT DETECTED Final   Streptococcus pyogenes NOT DETECTED NOT DETECTED Final   A.calcoaceticus-baumannii NOT DETECTED NOT DETECTED Final   Bacteroides fragilis NOT DETECTED NOT DETECTED Final   Enterobacterales NOT DETECTED NOT DETECTED Final   Enterobacter cloacae complex NOT DETECTED NOT DETECTED Final   Escherichia coli NOT DETECTED NOT DETECTED Final   Klebsiella aerogenes NOT DETECTED NOT DETECTED Final   Klebsiella oxytoca NOT DETECTED NOT DETECTED Final   Klebsiella pneumoniae NOT DETECTED NOT DETECTED Final   Proteus species NOT DETECTED NOT DETECTED Final   Salmonella species NOT DETECTED NOT DETECTED Final   Serratia marcescens NOT DETECTED NOT DETECTED Final   Haemophilus influenzae NOT DETECTED NOT DETECTED Final   Neisseria meningitidis NOT DETECTED NOT DETECTED Final   Pseudomonas aeruginosa DETECTED (A) NOT DETECTED Final    Comment: CRITICAL RESULT CALLED TO, READ BACK BY AND VERIFIED WITH: J. LEDFORD,PHARMD [id number] 07/05/2021 T. TYSOR    Stenotrophomonas maltophilia NOT DETECTED NOT DETECTED Final   Candida albicans NOT DETECTED NOT DETECTED Final   Candida auris NOT DETECTED NOT DETECTED Final   Candida glabrata NOT DETECTED NOT DETECTED Final   Candida krusei NOT DETECTED NOT DETECTED Final   Candida parapsilosis NOT DETECTED NOT DETECTED Final   Candida tropicalis NOT DETECTED NOT DETECTED Final   Cryptococcus  neoformans/gattii NOT DETECTED NOT DETECTED Final   CTX-M ESBL NOT DETECTED NOT DETECTED Final   Carbapenem resistance IMP NOT DETECTED NOT DETECTED Final   Carbapenem resistance KPC NOT DETECTED NOT DETECTED Final   Carbapenem resistance NDM NOT DETECTED NOT DETECTED Final   Carbapenem resistance VIM NOT DETECTED NOT DETECTED Final    Comment: Performed at [facility], [address] [place], [place] [address]  Urine Culture     Status: None   Collection Time: 07/04/21 11:25 PM   Specimen: Urine, Catheterized  Result Value Ref Range Status   Specimen Description URINE, CATHETERIZED  Final   Special Requests NONE  Final   Culture   Final    NO GROWTH Performed at [facility], [address], [place], [place] [address]    Report Status 07/06/2021 FINAL  Final  Culture, blood (routine x 2)     Status: None   Collection Time: 07/05/21  4:37 PM   Specimen: BLOOD LEFT HAND  Result Value Ref Range Status   Specimen Description BLOOD LEFT HAND  Final   Special Requests   Final    BOTTLES DRAWN AEROBIC ONLY Blood Culture adequate volume   Culture   Final    NO GROWTH 5 DAYS Performed at [name], [address], [place], [place] [address]    Report Status 07/10/2021 FINAL  Final  Culture, blood (routine x 2)     Status: None   Collection Time: 07/05/21  4:37 PM   Specimen: BLOOD  Result Value Ref Range Status   Specimen Description BLOOD LEFT ANTECUBITAL  Final   Special Requests   Final    BOTTLES DRAWN AEROBIC ONLY Blood Culture results may not be optimal due to an inadequate volume of blood received in culture bottles   Culture   Final    NO GROWTH 5 DAYS Performed at [facility], [address], [place], [place] [address]    Report Status 07/10/2021 FINAL  Final  Anaerobic culture w Gram Stain     Status: None (Preliminary result)   Collection Time: 07/08/21  6:09 PM   Specimen:  Hemodialysis Graft; Other  Result Value Ref Range Status    Specimen Description HEMODIALYSIS GRAFT  Final   Special Requests RT FEM POP GRAFT  Final   Gram Stain   Final    ABUNDANT WBC PRESENT, PREDOMINANTLY PMN NO ORGANISMS SEEN Performed at [name], [address] [place], [place] [address]    Culture   Final    NO ANAEROBES ISOLATED; CULTURE IN PROGRESS FOR 5 DAYS   Report Status PENDING  Incomplete  Aerobic Culture w Gram Stain (superficial specimen)     Status: None   Collection Time: 07/08/21  6:09 PM   Specimen: Hemodialysis Graft  Result Value Ref Range Status   Specimen Description HEMODIALYSIS GRAFT  Final   Special Requests RT FEM POP GRAFT  Final   Gram Stain   Final    RARE WBC PRESENT,BOTH PMN AND MONONUCLEAR RARE GRAM NEGATIVE RODS Performed at [facility], [address] [place], [place] [address]    Culture RARE PSEUDOMONAS AERUGINOSA  Final   Report Status 07/11/2021 FINAL  Final   Organism ID, Bacteria PSEUDOMONAS AERUGINOSA  Final      Susceptibility   Pseudomonas aeruginosa - MIC*    CEFTAZIDIME 2 SENSITIVE Sensitive     CIPROFLOXACIN <=0.25 SENSITIVE Sensitive     GENTAMICIN <=1 SENSITIVE Sensitive     IMIPENEM 1 SENSITIVE Sensitive     PIP/TAZO <=4 SENSITIVE Sensitive     CEFEPIME 2 SENSITIVE Sensitive     * RARE PSEUDOMONAS AERUGINOSA          Radiology Studies: DG Skull 1-3 Views  Result Date: 07/10/2021 CLINICAL DATA:  Screening for MRI. EXAM: SKULL - 1-3 VIEW COMPARISON:  Previous head CT FINDINGS: Previous left parietal craniotomy has a good appearance. No intracranial metallic structure is identified. No contraindicating feature for MRI. IMPRESSION: Previous left parietal craniotomy.  No contraindicating feature. Electronically Signed   By: Paulina Fusi M.D.   On: 07/10/2021 08:18   DG Abd 1 View  Result Date: 07/10/2021 CLINICAL DATA:  Screening for MRI EXAM: ABDOMEN - 1 VIEW COMPARISON:  None. FINDINGS: Left side of the abdomen is not included. Upper abdomen is not included.  No radiopaque foreign object seen in the region visible. Surgical clips in the right groin related to previous vascular procedure. IMPRESSION: No contraindicating finding in the region visible. The entire abdomen is not excluded. Electronically Signed   By: Paulina Fusi M.D.   On: 07/10/2021 08:18   MR BRAIN WO CONTRAST  Result Date: 07/10/2021 CLINICAL DATA:  Recent peripheral vascular procedure. Readmission with altered mental status, acute kidney injury in metabolic encephalopathy. EXAM: MRI HEAD WITHOUT CONTRAST TECHNIQUE: Multiplanar, multiecho pulse sequences of the brain and surrounding structures were obtained without intravenous contrast. COMPARISON:  Head CT 06/15/2021 FINDINGS: Brain: Diffusion imaging does not show any acute or subacute infarction. Mild chronic small-vessel change affects the pons. No focal cerebellar insult. Cerebral hemispheres show generalized atrophy with pronounced chronic small-vessel ischemic changes of the white matter. No cortical or large vessel territory infarction. No mass lesion or hemorrhage. Bilateral subdural hygromas appears similar, 8-9 mm thick on the right and 4-5 mm thick on the left. No significant mass effect. Vascular: Major vessels at the base of the brain show flow. Skull and upper cervical spine: Previous left parietal craniotomy. Sinuses/Orbits: Clear except for small amount of fluid in the right maxillary sinus. Orbits negative. Other: None IMPRESSION: No acute or reversible finding. Chronic simple appearing subdural  hygromas, 8-9 mm thick on the right and 4-5 mm thick on the left, without significant mass-effect. Extensive chronic small-vessel ischemic changes affecting the brain, particularly affecting the cerebral hemispheric white matter. Some fluid layering in the right maxillary sinus. Similar to previous studies. Electronically Signed   By: Paulina FusiMark  Shogry M.D.   On: 07/10/2021 08:17   US EKG SITE RITE  Result Date: 07/09/2021 If Site Rite image  not attached, placement could not be confirmed due to current cardiac rhythm.       Scheduled Meds:  sodium chloride   Intravenous Once   sodium chloride   Intravenous Once   aspirin EC  81 mg Oral Daily   bethanechol  10 mg Oral TID   Chlorhexidine Gluconate Cloth  6 each Topical Daily   citalopram  20 mg Oral Daily   clopidogrel  75 mg Oral Daily   diphenhydrAMINE  25 mg Oral Once   ferrous fumarate-b12-vitamic C-folic acid  1 capsule Oral Daily   gabapentin  100 mg Oral Daily   gabapentin  300 mg Oral QHS   melatonin  10 mg Oral QHS   metoprolol tartrate  12.5 mg Oral BID   multivitamin with minerals  1 tablet Oral Daily   nicotine  7 mg Transdermal Daily   pantoprazole  40 mg Oral QPM   QUEtiapine  100 mg Oral BID   senna-docusate  1 tablet Oral BID   sodium chloride flush  10-40 mL Intracatheter Q12H   sodium chloride flush  10-40 mL Intracatheter Q12H   tamsulosin  0.8 mg Oral QPM   Continuous Infusions:  ceFEPime (MAXIPIME) IV 2 g (07/11/21 1329)   lactated ringers 125 mL/hr at 07/11/21 0536     LOS: 25 days    Time spent: 55 min  Hughie Clossavi Arantza Darrington, MD [facility]  To contact the attending provider between 7A-7P or the covering provider during after hours 7P-7A, please log into the web site [email or web address] and access using universal [place] password for that web site. If you do not have the password, please call the [name] operator.  07/11/2021, 2:53 PM

## 2021-07-12 DIAGNOSIS — R7881 Bacteremia: Secondary | ICD-10-CM | POA: Diagnosis not present

## 2021-07-12 DIAGNOSIS — M6281 Muscle weakness (generalized): Secondary | ICD-10-CM | POA: Diagnosis not present

## 2021-07-12 DIAGNOSIS — I739 Peripheral vascular disease, unspecified: Secondary | ICD-10-CM | POA: Diagnosis not present

## 2021-07-12 DIAGNOSIS — T827XXD Infection and inflammatory reaction due to other cardiac and vascular devices, implants and grafts, subsequent encounter: Secondary | ICD-10-CM | POA: Diagnosis not present

## 2021-07-12 DIAGNOSIS — D508 Other iron deficiency anemias: Secondary | ICD-10-CM | POA: Diagnosis not present

## 2021-07-12 DIAGNOSIS — Z4889 Encounter for other specified surgical aftercare: Secondary | ICD-10-CM | POA: Diagnosis not present

## 2021-07-12 DIAGNOSIS — Z87820 Personal history of traumatic brain injury: Secondary | ICD-10-CM | POA: Diagnosis not present

## 2021-07-12 DIAGNOSIS — N179 Acute kidney failure, unspecified: Secondary | ICD-10-CM | POA: Diagnosis not present

## 2021-07-12 DIAGNOSIS — Z720 Tobacco use: Secondary | ICD-10-CM | POA: Diagnosis not present

## 2021-07-12 DIAGNOSIS — G9341 Metabolic encephalopathy: Secondary | ICD-10-CM | POA: Diagnosis not present

## 2021-07-12 DIAGNOSIS — R404 Transient alteration of awareness: Secondary | ICD-10-CM | POA: Diagnosis not present

## 2021-07-12 DIAGNOSIS — B965 Pseudomonas (aeruginosa) (mallei) (pseudomallei) as the cause of diseases classified elsewhere: Secondary | ICD-10-CM | POA: Diagnosis not present

## 2021-07-12 DIAGNOSIS — G9009 Other idiopathic peripheral autonomic neuropathy: Secondary | ICD-10-CM | POA: Diagnosis not present

## 2021-07-12 DIAGNOSIS — R279 Unspecified lack of coordination: Secondary | ICD-10-CM | POA: Diagnosis not present

## 2021-07-12 DIAGNOSIS — I10 Essential (primary) hypertension: Secondary | ICD-10-CM | POA: Diagnosis not present

## 2021-07-12 DIAGNOSIS — Z743 Need for continuous supervision: Secondary | ICD-10-CM | POA: Diagnosis not present

## 2021-07-12 DIAGNOSIS — Z951 Presence of aortocoronary bypass graft: Secondary | ICD-10-CM | POA: Diagnosis not present

## 2021-07-12 DIAGNOSIS — R5381 Other malaise: Secondary | ICD-10-CM | POA: Diagnosis not present

## 2021-07-12 DIAGNOSIS — F32A Depression, unspecified: Secondary | ICD-10-CM | POA: Diagnosis not present

## 2021-07-12 DIAGNOSIS — E46 Unspecified protein-calorie malnutrition: Secondary | ICD-10-CM | POA: Diagnosis not present

## 2021-07-12 DIAGNOSIS — Z8616 Personal history of COVID-19: Secondary | ICD-10-CM | POA: Diagnosis not present

## 2021-07-12 DIAGNOSIS — Z4781 Encounter for orthopedic aftercare following surgical amputation: Secondary | ICD-10-CM | POA: Diagnosis not present

## 2021-07-12 DIAGNOSIS — D500 Iron deficiency anemia secondary to blood loss (chronic): Secondary | ICD-10-CM | POA: Diagnosis not present

## 2021-07-12 DIAGNOSIS — I5042 Chronic combined systolic (congestive) and diastolic (congestive) heart failure: Secondary | ICD-10-CM | POA: Diagnosis not present

## 2021-07-12 DIAGNOSIS — Z89611 Acquired absence of right leg above knee: Secondary | ICD-10-CM | POA: Diagnosis not present

## 2021-07-12 MED ORDER — OXYCODONE HCL 5 MG PO TABS: 5.0000 mg | ORAL_TABLET | Freq: Four times a day (QID) | ORAL | 0 refills | Status: DC | PRN

## 2021-07-12 MED ORDER — METOPROLOL TARTRATE 25 MG PO TABS: 12.5000 mg | ORAL_TABLET | Freq: Two times a day (BID) | ORAL | 0 refills | Status: DC

## 2021-07-12 MED ORDER — CITALOPRAM HYDROBROMIDE 20 MG PO TABS: 20.0000 mg | ORAL_TABLET | Freq: Every day | ORAL | 0 refills | Status: DC

## 2021-07-12 MED ORDER — CLONAZEPAM 0.25 MG PO TBDP: 0.2500 mg | ORAL_TABLET | Freq: Two times a day (BID) | ORAL | 0 refills | Status: DC | PRN

## 2021-07-12 MED ORDER — HEPARIN SOD (PORK) LOCK FLUSH 100 UNIT/ML IV SOLN
250.0000 [IU] | INTRAVENOUS | Status: AC | PRN
  Administered 2021-07-12: 250 [IU]
  Filled 2021-07-12: qty 2.5

## 2021-07-12 MED ORDER — CEFEPIME IV (FOR PTA / DISCHARGE USE ONLY): 2.0000 g | Freq: Three times a day (TID) | INTRAVENOUS | 0 refills | Status: AC

## 2021-07-12 NOTE — Progress Notes (Signed)
Patient's case was brought up to the [facility] leadership and charge nurse on the floor about the request of the family that they would like to talk to vascular surgery about the patient's left leg.  I was informed by charge nurse Dahlia Client yesterday that they had informed extended provider/nurse practitioner of vascular surgery yesterday and message was conveyed to Dr. Darrick Penna to talk to the family and that he mentioned that he will talk to the family this morning.  Patient was seen by [facility] practitioner of vascular surgery this morning.  No note from Dr. Darrick Penna.  Paged Dr. Darrick Penna.  Received a call back from his nurse.  He was in the OR.  Conveyed message once again to him that family wants to talk to him and no one else.  And also conveyed the message that family is not willing to discharge the patient until they are convinced that there is no issue with his left leg and they want to hear this from vascular surgery.  I was informed by the nurse in the OR that Dr. Darrick Penna will be in the OR for couple of hours and then will come see this patient and talk to the family and will inform us about the outcome.  Patient is medically ready otherwise and can be discharged once this family situation is sorted out.

## 2021-07-12 NOTE — TOC Transition Note (Signed)
Transition of Care [facility]) - CM/SW Discharge Note   Patient Details  Name: Lonnie [name] MRN: [id number] Date of Birth: Jul 23, 1951  Transition of [facility]) CM/SW Contact:  Eduard Roux, LCSW Phone Number: 07/12/2021, 2:28 PM   Clinical Narrative:     Patient will Discharge to: [facility]  Discharge Date: 07/12/2021 Family Notified: Daughter,[name]  Transport CX:KGYJ @ 3:30pm  Per MD patient is ready for discharge. RN, patient, and facility notified of discharge. Discharge Summary sent to facility. RN given number for [phone], Room 108. Ambulance transport requested for patient.   Clinical Social Worker signing off.  Antony Blackbird, MSW, LCSW Clinical Social Worker      Barriers to Discharge: Barriers Resolved   Patient Goals and CMS Choice        Discharge Placement              Patient chooses bed at: [facility] Patient to be transferred to facility by: [facility] Name of family member notified: daughter,[name] Patient and family notified of of transfer: 07/12/21  Discharge Plan and Services                                     Social Determinants of Health (SDOH) Interventions     Readmission Risk Interventions Readmission Risk Prevention Plan 06/14/2021 09/12/2020 03/12/2020  Post Dischage Appt Complete - -  Medication Screening Complete - -  Transportation Screening Complete Complete -  PCP or Specialist Appt within 3-5 Days - Complete -  HRI or Home Care Consult - Complete -  Social Work Consult for Recovery Care Planning/Counseling - Complete -  Palliative Care Screening - Not Applicable -  Medication Review [employer]) - Complete Complete  Some recent data might be hidden

## 2021-07-12 NOTE — Progress Notes (Addendum)
[facility]  Subjective  - Pleasant this am   Objective (!) 148/69 62 98.1 F (36.7 C) (Oral) 16 99%  Intake/Output Summary (Last 24 hours) at 07/12/2021 9528 Last data filed at 07/12/2021 4132 Gross per 24 hour  Intake 2968.48 ml  Output 3200 ml  Net -231.52 ml    Right AKA incision healing well, minimal ss drainage medial corner of the incision Left Foot warm DP/PT doppler signals Lungs non labored breathing  Assessment/Planning: POD # 4  70 y.o. 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 Santiago Glad Lianne Cure 07/12/2021 7:11 AM --  Agree with above. Right AKA with some edema but healing Right and left groin incisions clean and healing Ok for d/c from my standpoint.  Spoke with pt daughter by phone.  D/w her that blood flow to the left foot is not very good but that he does have some weak PT doppler flow on my exam.  He is not a candidate for any procedures on the left leg in his current state so advised to protect foot for now.  He has no rest pain or wound.  If he makes some recovery will consider addressing left leg down the road.  Will arrange follow up with Dr Myra Gianotti on 8/8  Fabienne Bruns, MD [facility] [place] Office: [phone]   Laboratory Lab Results: Recent Labs    07/09/21 1111 07/10/21 0500  WBC 19.6* 17.2*  HGB 8.3* 8.4*  HCT 26.3* 26.1*  PLT 472* 503*   BMET Recent Labs    07/09/21 1111 07/10/21 0500  NA 137 136  K 3.9 3.9  CL 105 105  CO2 28 26  GLUCOSE 94 109*  BUN 9 8  CREATININE 0.69 0.68  CALCIUM 8.4* 8.4*    COAG Lab Results  Component Value Date   INR 1.3 (H) 07/04/2021   INR 1.1 06/27/2021   INR 1.1 09/10/2020   No results found for:  PTT

## 2021-07-12 NOTE — Discharge Summary (Signed)
Physician Discharge Summary  Lonnie [name] QPY:[id number] DOB: 08-26-1951 DOA: 06/15/2021  PCP: Colon Branch, MD  Admit date: 06/15/2021 Discharge date: 07/12/2021 30 Day Unplanned Readmission Risk Score    Flowsheet Row ED to Hosp-Admission (Current) from 06/15/2021 in [facility] 4E CV SURGICAL PROGRESSIVE CARE  30 Day Unplanned Readmission Risk Score (%) 33.67 Filed at 07/12/2021 1200       This score is the patient's risk of an unplanned readmission within 30 days of being discharged (0 -100%). The score is based on dignosis, age, lab data, medications, orders, and past utilization.   Low:  0-14.9   Medium: 15-21.9   High: 22-29.9   Extreme: 30 and above          Admitted From: Home Disposition: SNF  Recommendations for Outpatient Follow-up:  Follow up with PCP in 1-2 weeks Follow-up with vascular surgery in 2 to 4 weeks Please obtain BMP/CBC in one week Please follow up with your PCP on the following pending results: Unresulted Labs (From admission, onward)     Start     Ordered   06/22/21 0500  Creatinine, serum  (enoxaparin (LOVENOX)    CrCl >/= 30 ml/min)  Weekly,   R     Comments: while on enoxaparin therapy    06/15/21 2150   Unscheduled  Occult blood card to lab, stool  As needed,   R      06/25/21 Wells: None Equipment/Devices: None  Discharge Condition: Stable CODE STATUS: DNR Diet recommendation: Cardiac  Subjective: Seen and examined.  He has no complaints.  He wants to be discharged from the hospital to SNF.  Brief/Interim Summary: 70 yo with hx CAD s/p CABG x4 2014, subdural hematoma s/p craniotomy (2020, with residual balance deficits and delayed processing), ischemic cardiomyopathy, right popliteal bypass with PTFE graft 01/23/2020, recent admission 06/07/21 with occluded R fem pop bypass s/p thrombolysis, mechanical thrombectomy of R femoropoliteal bypass graft, stent x2 of the right femoral bypass graft.  He was readmitted 6/25  with weakness and confusion and found to have bladder rentention and possible UTI (multiple species).  His covid testing was positive and he was treated with steroids and remdesivir.  He developed spontaneous bleeding on 7/6 from prior area of R femoropopliteal bypass and then large volume bleeding with hemorrhagic shock on 7/7.  He was taken to the OR by Dr. Carlis Abbott on 7/7 for exploration of R below knee popliteal wound, repair of R popliteal vein, ligation of R popliteal artery, and placement of negative pressure wound vac.  He had right AKA on 7/12 with gangrene of R foot.  He developed sepsis on 7/13-14 and was found to have pseudomonas bacteremia related to his infected graft.  ID consulted, he was started on cefepime.  7/18 s/p removal of infected RLE bypass graft.  ID recommended 4 weeks of cefepime, then oral cipro. Follow repeat blood cultures 07/05/2021 [id number]  Acute Metabolic Encephalopathy/delirium, postoperative Developed initially last week with infection And confusion again post op on 07/09/2021- word finding difficulties and some R>L ataxia.  Ended up having MRI brain which is negative for any acute stroke or any acute pathology.  Patient has been fully alert and oriented since I assumed his care on Tuesday which is 3 days ago.   Elevated Liver Enzymes  Fatty Liver Likely shock liver, improving Hepatocellular pattern of liver injury -> continues to improve Negative acute hepatitis panel  Minimize meds which could contribute to liver injury RUQ with mild fatty infiltration of liver.  Disc thinning statin due to elevated LFTs, which should be resumed once his LFTs are reasonably low.  Defer to PCP to make that decision for the timing and dosage of the atorvastatin.   Acute Blood Loss Anemia with Hypovolemic Shock Occluded Right Femoropopliteal Bypass s/p lysis complicated by bleeding with disruption and reocclusion of graft S/p right above knee amputation S/p exploration of R below knee  popliteal wound, repair of R popliteal vein, ligation of right popliteal artery and placement of negative pressure wound vac on 7/7 S/p right AKA 7/12 S/p 4 units pRBC  Continue aspirin, plavix Gabapentin for phantom pain Hb relatively stable   Severe PAD  CAD s/p CABG x4 Aspirin, plavix Not currently on statin, unclear reason, will review (elevated LFT's at this time, follow, consider if improved) Continue metoprolol F/u with cardiology and vascular outpatient   LUTS  BPH S/p foley on admission Foley now removed, voiding successful. Needs urology f/u outpatient Flomax, bethanecol   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 Celexa 20 mg, seroquel 415 mg BID   Systolic  Diastolic HF Appears compensated, follow   COVID 19 Virus infection S/p remdesivir/decadron  Patient's daughter was against patient's discharge based on the conversation yesterday.  She had concerns about patient's left extremity and needed that to be taken care of with vascular.  Had several questions with vascular.  She received a call from Dr. Oneida Alar today and all her questions were answered.  Per Dr. Oneida Alar, he is not a candidate for any intervention on the left foot and he needs to get better from recent right foot intervention and AKA at first.  She is now agreeable with the discharge.  Patient is being discharged in stable condition to SNF.  Patient himself is a very pleasant gentleman and he was also hurtful to hear how his family was against him discharging.  He was requesting to discharge him to skilled nursing facility though.  Discharge Diagnoses:  Active Problems:   Acute metabolic encephalopathy   AKI (acute kidney injury) ([place])   Status post above-knee amputation of right lower extremity (HCC)   Altered mental status   Bacteremia due to Pseudomonas   Infected prosthetic vascular graft [facility])    Discharge Instructions  Discharge Instructions     [facility] pharmacist to adjust dose for Vancomycin, Aminoglycosides and other anti-infective therapies as requested by physician.   Complete by: As directed    [facility] to provide Cath Flo 25m   Complete by: As directed    Administer for PICC line occlusion and as ordered by physician for other access device issues.   Anaphylaxis Kit: Provided to treat any anaphylactic reaction to the medication being provided to the patient if First Dose or when requested by physician   Complete by: As directed    Epinephrine 1344mml vial / amp: Administer 0.44m55m0.44ml64mubcutaneously once for moderate to severe anaphylaxis, nurse to call physician and pharmacy when reaction occurs and call 911 if needed for immediate care   Diphenhydramine 50mg35mIV vial: Administer 25-50mg 55mM PRN for first dose reaction, rash, itching, mild reaction, nurse to call physician and pharmacy when reaction occurs   Sodium Chloride 0.9% NS 500ml I26mdminister if needed for hypovolemic blood pressure drop or as ordered by physician after call to physician with anaphylactic reaction   Change dressing on IV access line weekly  and PRN   Complete by: As directed    Flush IV access with Sodium Chloride 0.9% and Heparin 10 units/ml or 100 units/ml   Complete by: As directed    Home infusion instructions - [facility]   Complete by: As directed    Instructions: Flush IV access with Sodium Chloride 0.9% and Heparin 10units/ml or 100units/ml   Change dressing on IV access line: Weekly and PRN   Instructions Cath Flo 42m: Administer for PICC Line occlusion and as ordered by physician for other access device   [facility] pharmacist to adjust dose for: Vancomycin, Aminoglycosides and other anti-infective therapies as requested by physician   Method of administration may be changed at the discretion of home infusion pharmacist based upon assessment of the patient and/or caregiver's ability to  self-administer the medication ordered   Complete by: As directed    Outpatient Parenteral Antibiotic Therapy Information Antibiotic: Cefepime (Maxipime) IVPB; Indications for use: SEPSIS DUE TO PSEUDOMONAS BACTEREMIA; End Date: 08/06/2021   Complete by: As directed    Antibiotic: Cefepime (Maxipime) IVPB   Indications for use: SEPSIS DUE TO PSEUDOMONAS BACTEREMIA   End Date: 08/06/2021      Allergies as of 07/12/2021       Reactions   Ace Inhibitors Swelling, Other (See Comments)   Angioedema   Eggs Or Egg-derived Products Nausea Only, Other (See Comments)   Cannot eat prepared eggs- Occasional nausea   Lactose Intolerance (gi) Diarrhea, Nausea Only, Other (See Comments)   Flatulence, also   Latex Itching        Medication List     STOP taking these medications    atorvastatin 80 MG tablet Commonly known as: LIPITOR   ferrous fumarate 325 (106 Fe) MG Tabs tablet Commonly known as: HEMOCYTE - 106 mg FE       TAKE these medications    acetaminophen 500 MG tablet Commonly known as: TYLENOL Take 1,000 mg by mouth in the morning and at bedtime.   aspirin EC 81 MG tablet Take 81 mg by mouth daily.   BEN GAY EX Apply 1 application topically daily as needed (for back pain).   CALCIUM 600+D3 PO Take 1 tablet by mouth daily with breakfast.   ceFEPime  IVPB Commonly known as: MAXIPIME Inject 2 g into the vein every 8 (eight) hours for 28 days. Indication:  Vascular graft infection First Dose: No Last Day of Therapy:  08/06/2021 Labs - Once weekly:  CBC/D and BMP, Labs - Every other week:  ESR and CRP Method of administration: IV Push Method of administration may be changed at the discretion of home infusion pharmacist based upon assessment of the patient and/or caregiver's ability to self-administer the medication ordered.   citalopram 20 MG tablet Commonly known as: CELEXA Take 1 tablet (20 mg total) by mouth daily. Start taking on: July 13, 2021 What changed:  how much to take   clonazePAM 0.25 MG disintegrating tablet Commonly known as: KLONOPIN Take 1 tablet (0.25 mg total) by mouth 2 (two) times daily as needed for seizure. DISSOLVE 1 TABLET ON THE  TONGUE TWICE DAILY AS  NEEDED What changed: See the new instructions.   clopidogrel 75 MG tablet Commonly known as: PLAVIX Take 1 tablet (75 mg total) by mouth daily. What changed: when to take this   docusate sodium 100 MG capsule Commonly known as: COLACE Take 100 mg by mouth daily.   furosemide 20 MG tablet Commonly known as: LASIX Take 1  tablet (20 mg total) by mouth daily as needed (ankle swelling). What changed: when to take this   gabapentin 100 MG capsule Commonly known as: NEURONTIN Take 1 capsule (100 mg total) by mouth 2 (two) times daily.   irbesartan 300 MG tablet Commonly known as: AVAPRO Take 1 tablet (300 mg total) by mouth daily.   melatonin 5 MG Tabs Take 10 mg by mouth every evening.   metoprolol tartrate 25 MG tablet Commonly known as: LOPRESSOR Take 0.5 tablets (12.5 mg total) by mouth 2 (two) times daily. What changed: how much to take   multivitamin with minerals Tabs tablet Take 1 tablet by mouth daily.   nicotine 7 mg/24hr patch Commonly known as: NICODERM CQ - dosed in mg/24 hr Place 1 patch (7 mg total) onto the skin daily.   oxyCODONE 5 MG immediate release tablet Commonly known as: Roxicodone Take 1 tablet (5 mg total) by mouth every 6 (six) hours as needed. What changed: reasons to take this   pantoprazole 40 MG tablet Commonly known as: PROTONIX Take 1 tablet (40 mg total) by mouth at bedtime. What changed: when to take this   QUEtiapine 200 MG tablet Commonly known as: SEROQUEL Take 1 tablet (200 mg total) by mouth 2 (two) times daily. What changed: when to take this   tamsulosin 0.4 MG Caps capsule Commonly known as: FLOMAX Take 2 capsules (0.8 mg total) by mouth daily after supper. What changed: when to take this                Discharge Care Instructions  (From admission, onward)           Start     Ordered   07/12/21 0000  Change dressing on IV access line weekly and PRN  (Home infusion instructions - [facility] )        07/12/21 1349            Follow-up Information     [facility] Follow up in 4 week(s).   Specialty: Vascular Surgery Why: sent Contact information: [place] D[place], MD Follow up in 1 week(s).   Specialty: Internal Medicine Contact information: [address] [place] [address] [phone]         Burnell Blanks, MD .   Specialty: Cardiology Contact information: [address] [place] [address] [phone]                Allergies  Allergen Reactions   Ace Inhibitors Swelling and Other (See Comments)    Angioedema   Eggs Or Egg-Derived Products Nausea Only and Other (See Comments)    Cannot eat prepared eggs- Occasional nausea   Lactose Intolerance (Gi) Diarrhea, Nausea Only and Other (See Comments)    Flatulence, also   Latex Itching    Consultations: Vascular surgery   Procedures/Studies: DG Skull 1-3 Views  Result Date: 07/10/2021 CLINICAL DATA:  Screening for MRI. EXAM: SKULL - 1-3 VIEW COMPARISON:  Previous head CT FINDINGS: Previous left parietal craniotomy has a good appearance. No intracranial metallic structure is identified. No contraindicating feature for MRI. IMPRESSION: Previous left parietal craniotomy.  No contraindicating feature. Electronically Signed   By: Nelson Chimes M.D.   On: 07/10/2021 08:18   DG Knee 1-2 Views Right  Result Date: 06/21/2021 CLINICAL DATA:  Right knee swelling. EXAM: RIGHT KNEE -  1-2 VIEW COMPARISON:  None. FINDINGS: No fracture or dislocation is noted. No joint effusion is noted. Probable large osteophyte is seen arising from the superior patella.  Vascular calcifications are noted. Vascular stent is noted in popliteal fossa. No significant joint [name] narrowing is noted. IMPRESSION: No definite acute abnormality seen. Chronic findings as described above. Electronically Signed   By: Marijo Conception M.D.   On: 06/21/2021 14:27   DG Ankle Complete Right  Result Date: 06/15/2021 CLINICAL DATA:  Infection/injury to foot. EXAM: RIGHT ANKLE - COMPLETE 3+ VIEW.  Patient unable to straighten foot. COMPARISON:  None. FINDINGS: No cortical erosion or destruction. There is no evidence of fracture, dislocation, or joint effusion. There is no evidence of arthropathy or other focal bone abnormality. Soft tissues are unremarkable. IMPRESSION: 1. No radiographic evidence of osteomyelitis. 2. No acute displaced fracture or dislocation. 3. Limited evaluation of the right ankle due to positioning. Electronically Signed   By: Iven Finn M.D.   On: 06/15/2021 19:42   DG Abd 1 View  Result Date: 07/10/2021 CLINICAL DATA:  Screening for MRI EXAM: ABDOMEN - 1 VIEW COMPARISON:  None. FINDINGS: Left side of the abdomen is not included. Upper abdomen is not included. No radiopaque foreign object seen in the region visible. Surgical clips in the right groin related to previous vascular procedure. IMPRESSION: No contraindicating finding in the region visible. The entire abdomen is not excluded. Electronically Signed   By: Nelson Chimes M.D.   On: 07/10/2021 08:18   CT Head Wo Contrast  Result Date: 06/15/2021 CLINICAL DATA:  Mental status change.  Unknown cause. EXAM: CT HEAD WITHOUT CONTRAST TECHNIQUE: Contiguous axial images were obtained from the base of the skull through the vertex without intravenous contrast. COMPARISON:  CT head 09/09/2020 FINDINGS: Brain: Cerebral ventricle sizes are concordant with the degree of cerebral volume loss. Patchy and confluent areas of decreased attenuation are noted throughout the deep and periventricular white matter of the cerebral  hemispheres bilaterally, compatible with chronic microvascular ischemic disease. No evidence of large-territorial acute infarction. No parenchymal hemorrhage. No mass lesion. No acute extra-axial collection. Similar-appearing right subdural hygroma. No mass effect or midline shift. No hydrocephalus. Basilar cisterns are patent. Redemonstration of a partial empty sella. Vascular: No hyperdense vessel. Skull: No acute fracture or focal lesion. Left parietal soft skull surgical hardware. Sinuses/Orbits: Mucosal thickening of all the paranasal sinuses. The mastoid air cells are clear. The orbits are unremarkable. Other: None. IMPRESSION: 1. No acute intracranial abnormality. 2. Pansinusitis. Electronically Signed   By: Iven Finn M.D.   On: 06/15/2021 19:45   CT KNEE RIGHT W CONTRAST  Result Date: 06/22/2021 CLINICAL DATA:  Soft tissue infection.  Active COVID and MRSA. EXAM: CT OF THE 06/21/2021 radiograph KNEE WITH CONTRAST TECHNIQUE: Multidetector CT imaging was performed following the standard protocol during bolus administration of intravenous contrast. CONTRAST:  173m OMNIPAQUE IOHEXOL 300 MG/ML  SOLN COMPARISON:  Lower extremity CT angiogram from 03/02/2020 FINDINGS: Bones/Joint/Cartilage Marginal spurring along the patella along with a sclerotic bipartite portion. Small knee joint effusion without substantial appreciable synovitis. No acute bony findings. Ligaments Suboptimally assessed by CT. Muscles and Tendons Unremarkable Soft tissues The patient as a femoral to popliteal bypass in addition to an approximately 10 cm stent in the distal portion of the bypass extending to the anastomosis. Surrounding the distal component of the bypass graft and the stent, there is a 20.5 by 8.6 by 4.8 cm (volume = 440 cm^3) complex fluid collection. Proximally  this fluid collection is lower in density, but in the vicinity of the stent there is surrounding high density components characteristic of hematoma/blood  products. This collection has a caudad extension along the anteromedial tibia, and bulges outward stores the medial cutaneous surface in a manner somewhat similar in appearance and location to the prior abscess shown on 03/02/2020, and accordingly superinfection of this hematoma is not readily excluded. There is no observed gas within the collection at this time. Patency of the graft and stent are not assessed on today's noncontrast CT. Atherosclerotic calcification of the native popliteal artery is observed. IMPRESSION: 1. Approximately 440 cubic cm complex fluid collection surrounds the distal portion of the femoral-popliteal bypass graft and the stent along the distal anastomosis in the popliteal [name]. This has high density components especially around the stent compatible with blood products. This also seems to occupy some the [name] occupied by the previously drained abscess that was shown on the 03/02/2020 exam; superinfection of this hematoma is not excluded. 2. Small knee joint effusion without appreciable synovitis. Electronically Signed   By: Van Clines M.D.   On: 06/22/2021 10:23   MR BRAIN WO CONTRAST  Result Date: 07/10/2021 CLINICAL DATA:  Recent peripheral vascular procedure. Readmission with altered mental status, acute kidney injury in metabolic encephalopathy. EXAM: MRI HEAD WITHOUT CONTRAST TECHNIQUE: Multiplanar, multiecho pulse sequences of the brain and surrounding structures were obtained without intravenous contrast. COMPARISON:  Head CT 06/15/2021 FINDINGS: Brain: Diffusion imaging does not show any acute or subacute infarction. Mild chronic small-vessel change affects the pons. No focal cerebellar insult. Cerebral hemispheres show generalized atrophy with pronounced chronic small-vessel ischemic changes of the white matter. No cortical or large vessel territory infarction. No mass lesion or hemorrhage. Bilateral subdural hygromas appears similar, 8-9 mm thick on the right and  4-5 mm thick on the left. No significant mass effect. Vascular: Major vessels at the base of the brain show flow. Skull and upper cervical spine: Previous left parietal craniotomy. Sinuses/Orbits: Clear except for small amount of fluid in the right maxillary sinus. Orbits negative. Other: None IMPRESSION: No acute or reversible finding. Chronic simple appearing subdural hygromas, 8-9 mm thick on the right and 4-5 mm thick on the left, without significant mass-effect. Extensive chronic small-vessel ischemic changes affecting the brain, particularly affecting the cerebral hemispheric white matter. Some fluid layering in the right maxillary sinus. Similar to previous studies. Electronically Signed   By: Nelson Chimes M.D.   On: 07/10/2021 08:17   DG CHEST PORT 1 VIEW  Result Date: 07/04/2021 CLINICAL DATA:  Fever today. EXAM: PORTABLE CHEST 1 VIEW COMPARISON:  Single-view of the chest 06/26/2021 and 09/10/2020 FINDINGS: Heart size is upper normal. The patient is status post CABG. Lungs clear. No pneumothorax or pleural fluid. No acute or focal bony abnormality. IMPRESSION: No acute disease. Electronically Signed   By: Inge Rise M.D.   On: 07/04/2021 10:45   DG CHEST PORT 1 VIEW  Result Date: 06/26/2021 CLINICAL DATA:  70 year old male with concern for pneumonia. EXAM: PORTABLE CHEST - 1 VIEW COMPARISON:  06/16/2021 FINDINGS: The mediastinal contours are within normal limits. No cardiomegaly. Similar appearing postsurgical changes after coronary artery bypass graft. The lungs are clear bilaterally without evidence of focal consolidation, pleural effusion, or pneumothorax. No acute osseous abnormality. IMPRESSION: Significantly improved aeration of the lungs bilaterally. No acute cardiopulmonary process. Electronically Signed   By: Ruthann Cancer MD   On: 06/26/2021 09:12   Portable chest 1 View  Result Date:  06/16/2021 CLINICAL DATA:  Initial evaluation for acute shortness of breath. EXAM: PORTABLE CHEST  1 VIEW COMPARISON:  Prior radiograph from 09/10/2020. FINDINGS: Patient is markedly rotated to the right. Median sternotomy wires underlying CABG markers noted. Cardiomegaly grossly stable allowing for rotation. Mediastinal silhouette within normal limits. Lungs hypoinflated. Mildly increased pulmonary vascular and interstitial prominence, suggesting pulmonary interstitial congestion. Increased patchy opacity at the right lung base, which could reflect atelectasis or infiltrate. Veiling opacity overlying the right lung base suggestive of an associated small pleural effusion. No pneumothorax. Osseous structures unchanged. IMPRESSION: 1. Increased patchy opacities at the right lung base, which could reflect atelectasis or infiltrate. Superimposed veiling opacity suggestive of a small pleural effusion. 2. Cardiomegaly with underlying mild diffuse pulmonary interstitial congestion. Electronically Signed   By: Jeannine Boga M.D.   On: 06/16/2021 03:47   ECHOCARDIOGRAM COMPLETE  Result Date: 07/04/2021    ECHOCARDIOGRAM REPORT   Patient Name:   Lonnie [name] Date of Exam: 07/04/2021 Medical Rec #:  [id number]          Height:       74.0 in Accession #:    [id number]         Weight:       159.0 lb Date of Birth:  14-Feb-1951          BSA:          1.971 m Patient Age:    73 years           BP:           114/63 mmHg Patient Gender: M                  HR:           95 bpm. Exam Location:  Inpatient Procedure: 2D Echo, Cardiac Doppler and Color Doppler Indications:    CHF  History:        Patient has no prior history of Echocardiogram examinations,                 most recent 05/12/2017. CAD and Previous Myocardial Infarction;                 Risk Factors:Former Smoker and Hypertension.  Sonographer:    Cammy Brochure Referring Phys: [phone] A CALDWELL POWELL JR  Sonographer Comments: Image acquisition challenging due to uncooperative patient and patient is confused. IMPRESSIONS  1. Left ventricular ejection  fraction, by estimation, is 50%. The left ventricle has mildly decreased function. The left ventricle demonstrates regional wall motion abnormalities with basal to mid inferior hypokinesis. There is mild left ventricular hypertrophy. Left ventricular diastolic parameters are consistent with Grade I diastolic dysfunction (impaired relaxation).  2. Right ventricular systolic function is normal. The right ventricular size is normal. Tricuspid regurgitation signal is inadequate for assessing PA pressure.  3. The mitral valve is normal in structure. Trivial mitral valve regurgitation. No evidence of mitral stenosis.  4. The aortic valve is tricuspid. Aortic valve regurgitation is not visualized. Mild aortic valve sclerosis is present, with no evidence of aortic valve stenosis.  5. The inferior vena cava is normal in size with greater than 50% respiratory variability, suggesting right atrial pressure of 3 mmHg. FINDINGS  Left Ventricle: Left ventricular ejection fraction, by estimation, is 50%. The left ventricle has mildly decreased function. The left ventricle demonstrates regional wall motion abnormalities. The left ventricular internal cavity size was normal in size. There is mild left ventricular hypertrophy. Left ventricular diastolic  parameters are consistent with Grade I diastolic dysfunction (impaired relaxation). Right Ventricle: The right ventricular size is normal. No increase in right ventricular wall thickness. Right ventricular systolic function is normal. Tricuspid regurgitation signal is inadequate for assessing PA pressure. Left Atrium: Left atrial size was normal in size. Right Atrium: Right atrial size was normal in size. Pericardium: There is no evidence of pericardial effusion. Mitral Valve: The mitral valve is normal in structure. Trivial mitral valve regurgitation. No evidence of mitral valve stenosis. Tricuspid Valve: The tricuspid valve is normal in structure. Tricuspid valve regurgitation is not  demonstrated. Aortic Valve: The aortic valve is tricuspid. Aortic valve regurgitation is not visualized. Mild aortic valve sclerosis is present, with no evidence of aortic valve stenosis. Aortic valve mean gradient measures 9.0 mmHg. Aortic valve peak gradient measures 16.6 mmHg. Aortic valve area, by VTI measures 2.78 cm. Pulmonic Valve: The pulmonic valve was normal in structure. Pulmonic valve regurgitation is trivial. Aorta: The aortic root is normal in size and structure. Venous: The inferior vena cava is normal in size with greater than 50% respiratory variability, suggesting right atrial pressure of 3 mmHg. IAS/Shunts: No atrial level shunt detected by color flow Doppler.  LEFT VENTRICLE PLAX 2D LVIDd:         4.80 cm  Diastology LVIDs:         3.40 cm  LV e' medial:    11.90 cm/s LV PW:         1.10 cm  LV E/e' medial:  5.7 LV IVS:        1.20 cm  LV e' lateral:   16.50 cm/s LVOT diam:     2.20 cm  LV E/e' lateral: 4.1 LV SV:         90 LV SV Index:   46 LVOT Area:     3.80 cm  IVC IVC diam: 1.80 cm LEFT ATRIUM             Index       RIGHT ATRIUM           Index LA diam:        3.50 cm 1.78 cm/m  RA Area:     17.50 cm LA Vol (A2C):   60.2 ml 30.54 ml/m RA Volume:   42.50 ml  21.56 ml/m LA Vol (A4C):   44.5 ml 22.57 ml/m LA Biplane Vol: 53.9 ml 27.34 ml/m  AORTIC VALVE AV Area (Vmax):    2.66 cm AV Area (Vmean):   2.63 cm AV Area (VTI):     2.78 cm AV Vmax:           204.00 cm/s AV Vmean:          136.000 cm/s AV VTI:            0.324 m AV Peak Grad:      16.6 mmHg AV Mean Grad:      9.0 mmHg LVOT Vmax:         143.00 cm/s LVOT Vmean:        94.000 cm/s LVOT VTI:          0.237 m LVOT/AV VTI ratio: 0.73  AORTA Ao Root diam: 3.70 cm Ao Asc diam:  3.40 cm MITRAL VALVE MV Area (PHT): 4.60 cm    SHUNTS MV Decel Time: 165 msec    Systemic VTI:  0.24 m MV E velocity: 68.10 cm/s  Systemic Diam: 2.20 cm MV A velocity: 89.60 cm/s MV E/A ratio:  0.76  Loralie Champagne MD Electronically signed by Loralie Champagne  MD Signature Date/Time: 07/04/2021/5:06:09 PM    Final    VAS [place] LOWER EXTREMITY VENOUS (DVT)  Result Date: 06/17/2021  Lower Venous DVT Study Patient Name:  FLAVIUS REPSHER  Date of Exam:   06/17/2021 Medical Rec #: [id number]           Accession #:    [id number] Date of Birth: 19-Mar-1951           Patient Gender: M Patient Age:   70Y Exam Location:  [facility] Procedure:      VAS [place] LOWER EXTREMITY VENOUS (DVT) Referring Phys: [id number] Charlesetta Ivory GONFA --------------------------------------------------------------------------------  Indications: Swelling, s/p revascularization of RT stent 06-08-2021. Other Indications: Covid-19+. Risk Factors: Surgery 06-07-2021 Peripheral vascular thrombectomy RT 06-08-2021 Follow-up thrombolytic angiogram, mechanical thrombectomy, stent x2 of bypass graft. Comparison Study: 06-06-2021 Lower extremity bypass graft duplex showed                   occlusion of right femoral to popliteal bypass graft. Performing Technologist: Darlin Coco RDMS,RVT  Examination Guidelines: A complete evaluation includes B-mode imaging, spectral Doppler, color Doppler, and power Doppler as needed of all accessible portions of each vessel. Bilateral testing is considered an integral part of a complete examination. Limited examinations for reoccurring indications may be performed as noted. The reflux portion of the exam is performed with the patient in reverse Trendelenburg.  +---------+---------------+---------+-----------+----------+-------------------+ RIGHT    CompressibilityPhasicitySpontaneityPropertiesThrombus Aging      +---------+---------------+---------+-----------+----------+-------------------+ CFV      Full           Yes      Yes                                      +---------+---------------+---------+-----------+----------+-------------------+ SFJ      Full                                                              +---------+---------------+---------+-----------+----------+-------------------+ FV Prox  Full                                                             +---------+---------------+---------+-----------+----------+-------------------+ FV Mid   Full                                                             +---------+---------------+---------+-----------+----------+-------------------+ FV DistalFull                                                             +---------+---------------+---------+-----------+----------+-------------------+ PFV      Full                                                             +---------+---------------+---------+-----------+----------+-------------------+  POP                     Yes      Yes                  Unable to compress                                                        secondary to                                                              extensive fluid at                                                        pop fossa, patent                                                         by color            +---------+---------------+---------+-----------+----------+-------------------+ PTV      Full                                                             +---------+---------------+---------+-----------+----------+-------------------+ PERO     Full                                                             +---------+---------------+---------+-----------+----------+-------------------+ Gastroc  Full                                                             +---------+---------------+---------+-----------+----------+-------------------+   +---------+---------------+---------+-----------+----------+--------------+ LEFT     CompressibilityPhasicitySpontaneityPropertiesThrombus Aging +---------+---------------+---------+-----------+----------+--------------+ CFV      Full            Yes      Yes                                 +---------+---------------+---------+-----------+----------+--------------+ SFJ      Full                                                        +---------+---------------+---------+-----------+----------+--------------+  FV Prox  Full                                                        +---------+---------------+---------+-----------+----------+--------------+ FV Mid   Full                                                        +---------+---------------+---------+-----------+----------+--------------+ FV DistalFull                                                        +---------+---------------+---------+-----------+----------+--------------+ PFV      Full                                                        +---------+---------------+---------+-----------+----------+--------------+ POP      Full           Yes      Yes                                 +---------+---------------+---------+-----------+----------+--------------+ PTV      Full                                                        +---------+---------------+---------+-----------+----------+--------------+ PERO     Full                                                        +---------+---------------+---------+-----------+----------+--------------+     Summary: RIGHT: - There is no evidence of deep vein thrombosis in the lower extremity.  - No cystic structure found in the popliteal fossa.  - Incidental: Extensive fluid surrounding stent at popliteal fossa. Flow is identified within the stent at the level of the popliteal. - Ultrasound characteristics of enlarged lymph nodes are noted in the groin.  LEFT: - There is no evidence of deep vein thrombosis in the lower extremity.  - No cystic structure found in the popliteal fossa.  *See table(s) above for measurements and observations. Electronically signed by Ruta Hinds MD on 06/17/2021  at 7:10:00 PM.    Final    [place] EKG SITE RITE  Result Date: 07/09/2021 If Site Rite image not attached, placement could not be confirmed due to current cardiac rhythm.  US Abdomen Limited RUQ (LIVER/GB)  Result Date: 07/05/2021 CLINICAL DATA:  History of hepatitis EXAM: ULTRASOUND ABDOMEN LIMITED RIGHT UPPER QUADRANT COMPARISON:  06/18/2021 FINDINGS: Gallbladder: No gallstones or wall thickening visualized. No sonographic Percell Miller  sign noted by sonographer. Common bile duct: Diameter: 3.4 mm Liver: Mild increased echogenicity is noted similar to that seen on the prior exam likely representing fatty infiltration. No focal mass is noted. Portal vein is patent on color Doppler imaging with normal direction of blood flow towards the liver. Other: None. IMPRESSION: Mild fatty infiltration of the liver. No other focal abnormality is noted. Electronically Signed   By: Inez Catalina M.D.   On: 07/05/2021 01:56   US ABDOMEN LIMITED RUQ (LIVER/GB)  Result Date: 06/18/2021 CLINICAL DATA:  Elevated LFT EXAM: ULTRASOUND ABDOMEN LIMITED RIGHT UPPER QUADRANT COMPARISON:  CT 09/09/2020 FINDINGS: Gallbladder: No gallstones or wall thickening visualized. No sonographic Murphy sign noted by sonographer. Common bile duct: Diameter: 5.9 mm Liver: Liver appears slightly echogenic. No focal hepatic abnormality. Portal vein is patent on color Doppler imaging with normal direction of blood flow towards the liver. Other: None. IMPRESSION: 1. Negative for gallstones. 2. Liver appears slightly echogenic suggesting steatosis Electronically Signed   By: Donavan Foil M.D.   On: 06/18/2021 19:34     Discharge Exam: Vitals:   07/12/21 0800 07/12/21 1145  BP: 129/79 138/73  Pulse:  (!) 59  Resp: 17 15  Temp: 98.1 F (36.7 C) 98.1 F (36.7 C)  SpO2: 99% 99%   Vitals:   07/12/21 0011 07/12/21 0415 07/12/21 0800 07/12/21 1145  BP: (!) 143/59 (!) 148/69 129/79 138/73  Pulse: 60 62  (!) 59  Resp: '15 16 17 15  ' Temp: 98.4 F  (36.9 C) 98.1 F (36.7 C) 98.1 F (36.7 C) 98.1 F (36.7 C)  TempSrc: Oral Oral Oral Oral  SpO2: 98% 99% 99% 99%  Weight:  64.5 kg    Height:        General: Pt is alert, awake, not in acute distress Cardiovascular: RRR, S1/S2 +, no rubs, no gallops Respiratory: CTA bilaterally, no wheezing, no rhonchi Abdominal: Soft, NT, ND, bowel sounds + Extremities: Right AKA    The results of significant diagnostics from this hospitalization (including imaging, microbiology, ancillary and laboratory) are listed below for reference.     Microbiology: Recent Results (from the past 240 hour(s))  Aerobic Culture w Gram Stain (superficial specimen)     Status: None   Collection Time: 07/02/21  4:13 PM   Specimen: PATH Other; Tissue  Result Value Ref Range Status   Specimen Description WOUND RIGHT LEG  Final   Special Requests BYPASS  GRAFT PT ON ANCEF  Final   Gram Stain   Final    MODERATE WBC PRESENT,BOTH PMN AND MONONUCLEAR NO ORGANISMS SEEN Performed at [facility], [address], [place], [place] [address]    Culture MODERATE PSEUDOMONAS AERUGINOSA  Final   Report Status 07/05/2021 FINAL  Final   Organism ID, Bacteria PSEUDOMONAS AERUGINOSA  Final      Susceptibility   Pseudomonas aeruginosa - MIC*    CEFTAZIDIME 2 SENSITIVE Sensitive     CIPROFLOXACIN <=0.25 SENSITIVE Sensitive     GENTAMICIN <=1 SENSITIVE Sensitive     IMIPENEM 1 SENSITIVE Sensitive     PIP/TAZO <=4 SENSITIVE Sensitive     CEFEPIME 2 SENSITIVE Sensitive     * MODERATE PSEUDOMONAS AERUGINOSA  Culture, blood (routine x 2)     Status: Abnormal   Collection Time: 07/04/21  8:49 AM   Specimen: BLOOD LEFT ARM  Result Value Ref Range Status   Specimen Description BLOOD LEFT ARM  Final   Special Requests   Final  BOTTLES DRAWN AEROBIC ONLY Blood Culture adequate volume   Culture  Setup Time   Final    GRAM NEGATIVE RODS AEROBIC BOTTLE ONLY CRITICAL VALUE NOTED.  VALUE IS CONSISTENT WITH PREVIOUSLY  REPORTED AND CALLED VALUE.    Culture (A)  Final    PSEUDOMONAS AERUGINOSA SUSCEPTIBILITIES PERFORMED ON PREVIOUS CULTURE WITHIN THE LAST 5 DAYS. Performed at [facility], [place] [address], [place], [place] [address]    Report Status 07/08/2021 FINAL  Final  Culture, blood (routine x 2)     Status: Abnormal   Collection Time: 07/04/21  8:53 AM   Specimen: BLOOD RIGHT HAND  Result Value Ref Range Status   Specimen Description BLOOD RIGHT HAND  Final   Special Requests   Final    BOTTLES DRAWN AEROBIC ONLY Blood Culture results may not be optimal due to an inadequate volume of blood received in culture bottles   Culture  Setup Time   Final    GRAM NEGATIVE RODS AEROBIC BOTTLE ONLY CRITICAL RESULT CALLED TO, READ BACK BY AND VERIFIED WITH: Serita Grammes [id number] 07/05/2021 Mena Goes Performed at [facility], [place] [address], [place], [place] [address]    Culture PSEUDOMONAS AERUGINOSA (A)  Final   Report Status 07/08/2021 FINAL  Final   Organism ID, Bacteria PSEUDOMONAS AERUGINOSA  Final      Susceptibility   Pseudomonas aeruginosa - MIC*    CEFTAZIDIME 4 SENSITIVE Sensitive     CIPROFLOXACIN <=0.25 SENSITIVE Sensitive     GENTAMICIN <=1 SENSITIVE Sensitive     IMIPENEM 1 SENSITIVE Sensitive     PIP/TAZO <=4 SENSITIVE Sensitive     CEFEPIME 2 SENSITIVE Sensitive     * PSEUDOMONAS AERUGINOSA  Blood Culture ID Panel (Reflexed)     Status: Abnormal   Collection Time: 07/04/21  8:53 AM  Result Value Ref Range Status   Enterococcus faecalis NOT DETECTED NOT DETECTED Final   Enterococcus Faecium NOT DETECTED NOT DETECTED Final   Listeria monocytogenes NOT DETECTED NOT DETECTED Final   Staphylococcus species NOT DETECTED NOT DETECTED Final   Staphylococcus aureus (BCID) NOT DETECTED NOT DETECTED Final   Staphylococcus epidermidis NOT DETECTED NOT DETECTED Final   Staphylococcus lugdunensis NOT DETECTED NOT DETECTED Final   Streptococcus species NOT DETECTED NOT DETECTED  Final   Streptococcus agalactiae NOT DETECTED NOT DETECTED Final   Streptococcus pneumoniae NOT DETECTED NOT DETECTED Final   Streptococcus pyogenes NOT DETECTED NOT DETECTED Final   A.calcoaceticus-baumannii NOT DETECTED NOT DETECTED Final   Bacteroides fragilis NOT DETECTED NOT DETECTED Final   Enterobacterales NOT DETECTED NOT DETECTED Final   Enterobacter cloacae complex NOT DETECTED NOT DETECTED Final   Escherichia coli NOT DETECTED NOT DETECTED Final   Klebsiella aerogenes NOT DETECTED NOT DETECTED Final   Klebsiella oxytoca NOT DETECTED NOT DETECTED Final   Klebsiella pneumoniae NOT DETECTED NOT DETECTED Final   Proteus species NOT DETECTED NOT DETECTED Final   Salmonella species NOT DETECTED NOT DETECTED Final   Serratia marcescens NOT DETECTED NOT DETECTED Final   Haemophilus influenzae NOT DETECTED NOT DETECTED Final   Neisseria meningitidis NOT DETECTED NOT DETECTED Final   Pseudomonas aeruginosa DETECTED (A) NOT DETECTED Final    Comment: CRITICAL RESULT CALLED TO, READ BACK BY AND VERIFIED WITH: J. LEDFORD,PHARMD [id number] 07/05/2021 T. TYSOR    Stenotrophomonas maltophilia NOT DETECTED NOT DETECTED Final   Candida albicans NOT DETECTED NOT DETECTED Final   Candida auris NOT DETECTED NOT DETECTED Final   Candida glabrata  NOT DETECTED NOT DETECTED Final   Candida krusei NOT DETECTED NOT DETECTED Final   Candida parapsilosis NOT DETECTED NOT DETECTED Final   Candida tropicalis NOT DETECTED NOT DETECTED Final   Cryptococcus neoformans/gattii NOT DETECTED NOT DETECTED Final   CTX-M ESBL NOT DETECTED NOT DETECTED Final   Carbapenem resistance IMP NOT DETECTED NOT DETECTED Final   Carbapenem resistance KPC NOT DETECTED NOT DETECTED Final   Carbapenem resistance NDM NOT DETECTED NOT DETECTED Final   Carbapenem resistance VIM NOT DETECTED NOT DETECTED Final    Comment: Performed at [facility], [place] [address], [place], [place] [address]  Urine Culture     Status: None    Collection Time: 07/04/21 11:25 PM   Specimen: Urine, Catheterized  Result Value Ref Range Status   Specimen Description URINE, CATHETERIZED  Final   Special Requests NONE  Final   Culture   Final    NO GROWTH Performed at [facility], [address] [place], [place] [address]    Report Status 07/06/2021 FINAL  Final  Culture, blood (routine x 2)     Status: None   Collection Time: 07/05/21  4:37 PM   Specimen: BLOOD LEFT HAND  Result Value Ref Range Status   Specimen Description BLOOD LEFT HAND  Final   Special Requests   Final    BOTTLES DRAWN AEROBIC ONLY Blood Culture adequate volume   Culture   Final    NO GROWTH 5 DAYS Performed at [facility], [place] [address] [place], [place] [address]    Report Status 07/10/2021 FINAL  Final  Culture, blood (routine x 2)     Status: None   Collection Time: 07/05/21  4:37 PM   Specimen: BLOOD  Result Value Ref Range Status   Specimen Description BLOOD LEFT ANTECUBITAL  Final   Special Requests   Final    BOTTLES DRAWN AEROBIC ONLY Blood Culture results may not be optimal due to an inadequate volume of blood received in culture bottles   Culture   Final    NO GROWTH 5 DAYS Performed at [facility], [place] [address], [place], [place] [address]    Report Status 07/10/2021 FINAL  Final  Anaerobic culture w Gram Stain     Status: None (Preliminary result)   Collection Time: 07/08/21  6:09 PM   Specimen: Hemodialysis Graft; Other  Result Value Ref Range Status   Specimen Description HEMODIALYSIS GRAFT  Final   Special Requests RT FEM POP GRAFT  Final   Gram Stain   Final    ABUNDANT WBC PRESENT, PREDOMINANTLY PMN NO ORGANISMS SEEN Performed at [facility], [address], [place], [place] [address]    Culture   Final    NO ANAEROBES ISOLATED; CULTURE IN PROGRESS FOR 5 DAYS   Report Status PENDING  Incomplete  Aerobic Culture w Gram Stain (superficial specimen)     Status: None   Collection Time: 07/08/21   6:09 PM   Specimen: Hemodialysis Graft  Result Value Ref Range Status   Specimen Description HEMODIALYSIS GRAFT  Final   Special Requests RT FEM POP GRAFT  Final   Gram Stain   Final    RARE WBC PRESENT,BOTH PMN AND MONONUCLEAR RARE GRAM NEGATIVE RODS Performed at [facility], [place] [address], [place], [place] [address]    Culture RARE PSEUDOMONAS AERUGINOSA  Final   Report Status 07/11/2021 FINAL  Final   Organism ID, Bacteria PSEUDOMONAS  AERUGINOSA  Final      Susceptibility   Pseudomonas aeruginosa - MIC*    CEFTAZIDIME 2 SENSITIVE Sensitive     CIPROFLOXACIN <=0.25 SENSITIVE Sensitive     GENTAMICIN <=1 SENSITIVE Sensitive     IMIPENEM 1 SENSITIVE Sensitive     PIP/TAZO <=4 SENSITIVE Sensitive     CEFEPIME 2 SENSITIVE Sensitive     * RARE PSEUDOMONAS AERUGINOSA  SARS CORONAVIRUS 2 (TAT 6-24 HRS) Nasopharyngeal Nasopharyngeal Swab     Status: None   Collection Time: 07/11/21  1:48 PM   Specimen: Nasopharyngeal Swab  Result Value Ref Range Status   SARS Coronavirus 2 NEGATIVE NEGATIVE Final    Comment: (NOTE) SARS-CoV-2 target nucleic acids are NOT DETECTED.  The SARS-CoV-2 RNA is generally detectable in upper and lower respiratory specimens during the acute phase of infection. Negative results do not preclude SARS-CoV-2 infection, do not rule out co-infections with other pathogens, and should not be used as the sole basis for treatment or other patient management decisions. Negative results must be combined with clinical observations, patient history, and epidemiological information. The expected result is Negative.  Fact Sheet for Patients: SugarRoll.be  Fact Sheet for Healthcare Providers: https://www.woods-mathews.com/  This test is not yet approved or cleared by the [place] FDA and  has been authorized for detection and/or diagnosis of SARS-CoV-2 by FDA under an Emergency Use Authorization (EUA). This EUA will  remain  in effect (meaning this test can be used) for the duration of the COVID-19 declaration under Se ction 564(b)(1) of the Act, 21 U.S.C. section 360bbb-3(b)(1), unless the authorization is terminated or revoked sooner.  Performed at [facility], [place] [address] [place], [place] [address]      Labs: BNP (last 3 results) Recent Labs    06/16/21 0553  BNP 774.1*   Basic Metabolic Panel: Recent Labs  Lab 07/06/21 0545 07/07/21 0056 07/08/21 0519 07/09/21 0044 07/09/21 1111 07/10/21 0500  NA 136 136 136 134* 137 136  K 3.9 4.3 4.0 4.7 3.9 3.9  CL 106 108 106 104 105 105  CO2 '24 23 22 26 28 26  ' GLUCOSE 105* 108* 113* 154* 94 109*  BUN '15 12 8 10 9 8  ' CREATININE 0.77 0.65 0.59* 0.70 0.69 0.68  CALCIUM 8.3* 8.0* 8.4* 8.2* 8.4* 8.4*  MG 2.0 2.0 1.9 2.0  --  2.0  PHOS 2.4* 2.4* 2.9 3.1  --  3.1   Liver Function Tests: Recent Labs  Lab 07/06/21 0545 07/07/21 0056 07/08/21 0519 07/09/21 0044 07/10/21 0500  AST 147* 90* 51* 62* 49*  ALT 391* 285* 213* 163* 140*  ALKPHOS 235* 189* 164* 140* 135*  BILITOT 1.3* 0.9 0.7 0.5 0.8  PROT 5.5* 5.5* 6.0* 5.5* 5.8*  ALBUMIN 1.9* 1.9* 2.0* 2.1* 2.1*   No results for input(s): LIPASE, AMYLASE in the last 168 hours. No results for input(s): AMMONIA in the last 168 hours. CBC: Recent Labs  Lab 07/06/21 0545 07/07/21 0056 07/08/21 0519 07/09/21 0044 07/09/21 1111 07/10/21 0500  WBC 20.7* 18.8* 15.4* 21.6* 19.6* 17.2*  NEUTROABS 17.5* 14.9* 11.6* 18.7*  --  13.6*  HGB 9.4* 9.0* 9.6* 8.6* 8.3* 8.4*  HCT 29.2* 28.3* 30.3* 27.9* 26.3* 26.1*  MCV 82.0 81.8 83.5 86.1 83.2 82.6  PLT 334 360 453* 475* 472* 503*   Cardiac Enzymes: No results for input(s): CKTOTAL, CKMB, CKMBINDEX, TROPONINI in the last 168 hours. BNP: Invalid input(s): POCBNP CBG: No results for input(s): GLUCAP in the last 168  hours. D-Dimer No results for input(s): DDIMER in the last 72 hours. Hgb A1c No results for input(s): HGBA1C in the last  72 hours. Lipid Profile No results for input(s): CHOL, HDL, LDLCALC, TRIG, CHOLHDL, LDLDIRECT in the last 72 hours. Thyroid function studies No results for input(s): TSH, T4TOTAL, T3FREE, THYROIDAB in the last 72 hours.  Invalid input(s): FREET3 Anemia work up No results for input(s): VITAMINB12, FOLATE, FERRITIN, TIBC, IRON, RETICCTPCT in the last 72 hours. Urinalysis    Component Value Date/Time   COLORURINE AMBER (A) 07/04/2021 2330   APPEARANCEUR CLEAR 07/04/2021 2330   LABSPEC 1.017 07/04/2021 2330   PHURINE 6.0 07/04/2021 2330   GLUCOSEU NEGATIVE 07/04/2021 2330   GLUCOSEU NEGATIVE 04/12/2021 1323   HGBUR NEGATIVE 07/04/2021 2330   BILIRUBINUR NEGATIVE 07/04/2021 2330   KETONESUR NEGATIVE 07/04/2021 2330   PROTEINUR NEGATIVE 07/04/2021 2330   UROBILINOGEN 0.2 04/12/2021 1323   NITRITE NEGATIVE 07/04/2021 2330   LEUKOCYTESUR NEGATIVE 07/04/2021 2330   Sepsis Labs Invalid input(s): PROCALCITONIN,  WBC,  LACTICIDVEN Microbiology Recent Results (from the past 240 hour(s))  Aerobic Culture w Gram Stain (superficial specimen)     Status: None   Collection Time: 07/02/21  4:13 PM   Specimen: PATH Other; Tissue  Result Value Ref Range Status   Specimen Description WOUND RIGHT LEG  Final   Special Requests BYPASS  GRAFT PT ON ANCEF  Final   Gram Stain   Final    MODERATE WBC PRESENT,BOTH PMN AND MONONUCLEAR NO ORGANISMS SEEN Performed at [facility], [place] [address] [place], [place] [address]    Culture MODERATE PSEUDOMONAS AERUGINOSA  Final   Report Status 07/05/2021 FINAL  Final   Organism ID, Bacteria PSEUDOMONAS AERUGINOSA  Final      Susceptibility   Pseudomonas aeruginosa - MIC*    CEFTAZIDIME 2 SENSITIVE Sensitive     CIPROFLOXACIN <=0.25 SENSITIVE Sensitive     GENTAMICIN <=1 SENSITIVE Sensitive     IMIPENEM 1 SENSITIVE Sensitive     PIP/TAZO <=4 SENSITIVE Sensitive     CEFEPIME 2 SENSITIVE Sensitive     * MODERATE PSEUDOMONAS AERUGINOSA  Culture,  blood (routine x 2)     Status: Abnormal   Collection Time: 07/04/21  8:49 AM   Specimen: BLOOD LEFT ARM  Result Value Ref Range Status   Specimen Description BLOOD LEFT ARM  Final   Special Requests   Final    BOTTLES DRAWN AEROBIC ONLY Blood Culture adequate volume   Culture  Setup Time   Final    GRAM NEGATIVE RODS AEROBIC BOTTLE ONLY CRITICAL VALUE NOTED.  VALUE IS CONSISTENT WITH PREVIOUSLY REPORTED AND CALLED VALUE.    Culture (A)  Final    PSEUDOMONAS AERUGINOSA SUSCEPTIBILITIES PERFORMED ON PREVIOUS CULTURE WITHIN THE LAST 5 DAYS. Performed at [facility], [place] [address], [place], [place] [address]    Report Status 07/08/2021 FINAL  Final  Culture, blood (routine x 2)     Status: Abnormal   Collection Time: 07/04/21  8:53 AM   Specimen: BLOOD RIGHT HAND  Result Value Ref Range Status   Specimen Description BLOOD RIGHT HAND  Final   Special Requests   Final    BOTTLES DRAWN AEROBIC ONLY Blood Culture results may not be optimal due to an inadequate volume of blood received in culture bottles   Culture  Setup Time   Final    GRAM NEGATIVE RODS AEROBIC BOTTLE ONLY CRITICAL RESULT CALLED TO, READ BACK BY AND VERIFIED  WITH: Serita Grammes [id number] 07/05/2021 Mena Goes Performed at [facility], [place] [address] [place], [place] [address]    Culture PSEUDOMONAS AERUGINOSA (A)  Final   Report Status 07/08/2021 FINAL  Final   Organism ID, Bacteria PSEUDOMONAS AERUGINOSA  Final      Susceptibility   Pseudomonas aeruginosa - MIC*    CEFTAZIDIME 4 SENSITIVE Sensitive     CIPROFLOXACIN <=0.25 SENSITIVE Sensitive     GENTAMICIN <=1 SENSITIVE Sensitive     IMIPENEM 1 SENSITIVE Sensitive     PIP/TAZO <=4 SENSITIVE Sensitive     CEFEPIME 2 SENSITIVE Sensitive     * PSEUDOMONAS AERUGINOSA  Blood Culture ID Panel (Reflexed)     Status: Abnormal   Collection Time: 07/04/21  8:53 AM  Result Value Ref Range Status   Enterococcus faecalis NOT DETECTED NOT DETECTED Final    Enterococcus Faecium NOT DETECTED NOT DETECTED Final   Listeria monocytogenes NOT DETECTED NOT DETECTED Final   Staphylococcus species NOT DETECTED NOT DETECTED Final   Staphylococcus aureus (BCID) NOT DETECTED NOT DETECTED Final   Staphylococcus epidermidis NOT DETECTED NOT DETECTED Final   Staphylococcus lugdunensis NOT DETECTED NOT DETECTED Final   Streptococcus species NOT DETECTED NOT DETECTED Final   Streptococcus agalactiae NOT DETECTED NOT DETECTED Final   Streptococcus pneumoniae NOT DETECTED NOT DETECTED Final   Streptococcus pyogenes NOT DETECTED NOT DETECTED Final   A.calcoaceticus-baumannii NOT DETECTED NOT DETECTED Final   Bacteroides fragilis NOT DETECTED NOT DETECTED Final   Enterobacterales NOT DETECTED NOT DETECTED Final   Enterobacter cloacae complex NOT DETECTED NOT DETECTED Final   Escherichia coli NOT DETECTED NOT DETECTED Final   Klebsiella aerogenes NOT DETECTED NOT DETECTED Final   Klebsiella oxytoca NOT DETECTED NOT DETECTED Final   Klebsiella pneumoniae NOT DETECTED NOT DETECTED Final   Proteus species NOT DETECTED NOT DETECTED Final   Salmonella species NOT DETECTED NOT DETECTED Final   Serratia marcescens NOT DETECTED NOT DETECTED Final   Haemophilus influenzae NOT DETECTED NOT DETECTED Final   Neisseria meningitidis NOT DETECTED NOT DETECTED Final   Pseudomonas aeruginosa DETECTED (A) NOT DETECTED Final    Comment: CRITICAL RESULT CALLED TO, READ BACK BY AND VERIFIED WITH: J. LEDFORD,PHARMD [id number] 07/05/2021 T. TYSOR    Stenotrophomonas maltophilia NOT DETECTED NOT DETECTED Final   Candida albicans NOT DETECTED NOT DETECTED Final   Candida auris NOT DETECTED NOT DETECTED Final   Candida glabrata NOT DETECTED NOT DETECTED Final   Candida krusei NOT DETECTED NOT DETECTED Final   Candida parapsilosis NOT DETECTED NOT DETECTED Final   Candida tropicalis NOT DETECTED NOT DETECTED Final   Cryptococcus neoformans/gattii NOT DETECTED NOT DETECTED Final   CTX-M  ESBL NOT DETECTED NOT DETECTED Final   Carbapenem resistance IMP NOT DETECTED NOT DETECTED Final   Carbapenem resistance KPC NOT DETECTED NOT DETECTED Final   Carbapenem resistance NDM NOT DETECTED NOT DETECTED Final   Carbapenem resistance VIM NOT DETECTED NOT DETECTED Final    Comment: Performed at [facility], [address], [place], [place] [address]  Urine Culture     Status: None   Collection Time: 07/04/21 11:25 PM   Specimen: Urine, Catheterized  Result Value Ref Range Status   Specimen Description URINE, CATHETERIZED  Final   Special Requests NONE  Final   Culture   Final    NO GROWTH Performed at [facility], [address] [place], [place] [address]    Report Status 07/06/2021 FINAL  Final  Culture, blood (routine x 2)     Status: None   Collection Time: 07/05/21  4:37 PM   Specimen: BLOOD LEFT HAND  Result Value Ref Range Status   Specimen Description BLOOD LEFT HAND  Final   Special Requests   Final    BOTTLES DRAWN AEROBIC ONLY Blood Culture adequate volume   Culture   Final    NO GROWTH 5 DAYS Performed at [facility], [address] [place], [place] [address]    Report Status 07/10/2021 FINAL  Final  Culture, blood (routine x 2)     Status: None   Collection Time: 07/05/21  4:37 PM   Specimen: BLOOD  Result Value Ref Range Status   Specimen Description BLOOD LEFT ANTECUBITAL  Final   Special Requests   Final    BOTTLES DRAWN AEROBIC ONLY Blood Culture results may not be optimal due to an inadequate volume of blood received in culture bottles   Culture   Final    NO GROWTH 5 DAYS Performed at [facility], [place] [address] [place], [place] [address]    Report Status 07/10/2021 FINAL  Final  Anaerobic culture w Gram Stain     Status: None (Preliminary result)   Collection Time: 07/08/21  6:09 PM   Specimen: Hemodialysis Graft; Other  Result Value Ref Range Status   Specimen Description HEMODIALYSIS GRAFT  Final   Special  Requests RT FEM POP GRAFT  Final   Gram Stain   Final    ABUNDANT WBC PRESENT, PREDOMINANTLY PMN NO ORGANISMS SEEN Performed at [facility], [address], [place], [place] [address]    Culture   Final    NO ANAEROBES ISOLATED; CULTURE IN PROGRESS FOR 5 DAYS   Report Status PENDING  Incomplete  Aerobic Culture w Gram Stain (superficial specimen)     Status: None   Collection Time: 07/08/21  6:09 PM   Specimen: Hemodialysis Graft  Result Value Ref Range Status   Specimen Description HEMODIALYSIS GRAFT  Final   Special Requests RT FEM POP GRAFT  Final   Gram Stain   Final    RARE WBC PRESENT,BOTH PMN AND MONONUCLEAR RARE GRAM NEGATIVE RODS Performed at [facility], [place] [address], [place], [place] [address]    Culture RARE PSEUDOMONAS AERUGINOSA  Final   Report Status 07/11/2021 FINAL  Final   Organism ID, Bacteria PSEUDOMONAS AERUGINOSA  Final      Susceptibility   Pseudomonas aeruginosa - MIC*    CEFTAZIDIME 2 SENSITIVE Sensitive     CIPROFLOXACIN <=0.25 SENSITIVE Sensitive     GENTAMICIN <=1 SENSITIVE Sensitive     IMIPENEM 1 SENSITIVE Sensitive     PIP/TAZO <=4 SENSITIVE Sensitive     CEFEPIME 2 SENSITIVE Sensitive     * RARE PSEUDOMONAS AERUGINOSA  SARS CORONAVIRUS 2 (TAT 6-24 HRS) Nasopharyngeal Nasopharyngeal Swab     Status: None   Collection Time: 07/11/21  1:48 PM   Specimen: Nasopharyngeal Swab  Result Value Ref Range Status   SARS Coronavirus 2 NEGATIVE NEGATIVE Final    Comment: (NOTE) SARS-CoV-2 target nucleic acids are NOT DETECTED.  The SARS-CoV-2 RNA is generally detectable in upper and lower respiratory specimens during the acute phase of infection. Negative results do not preclude SARS-CoV-2 infection, do not rule out co-infections with other pathogens, and should not be used as the sole basis for treatment or other patient management decisions. Negative results must be combined with clinical observations, patient  history, and  epidemiological information. The expected result is Negative.  Fact Sheet for Patients: SugarRoll.be  Fact Sheet for Healthcare Providers: https://www.woods-mathews.com/  This test is not yet approved or cleared by the [place] FDA and  has been authorized for detection and/or diagnosis of SARS-CoV-2 by FDA under an Emergency Use Authorization (EUA). This EUA will remain  in effect (meaning this test can be used) for the duration of the COVID-19 declaration under Se ction 564(b)(1) of the Act, 21 U.S.C. section 360bbb-3(b)(1), unless the authorization is terminated or revoked sooner.  Performed at [facility], [place] [address], [place], [place] [address]      Time coordinating discharge: Over 30 minutes  SIGNED:   Darliss Cheney, MD  [facility] 07/12/2021, 1:50 PM  If 7PM-7AM, please contact night-coverage [email or web address]

## 2021-07-12 NOTE — Progress Notes (Signed)
Patient left via stretcher with [facility].to SNF Alert and oriented. No complaints voice He had his belongings with him and pictures. Family aware of transfer to SNF now.

## 2021-07-12 NOTE — Progress Notes (Signed)
Occupational Therapy Treatment Patient Details Name: Lonnie [name] MRN: [id number] DOB: February 01, 1951 Today's Date: 07/12/2021    History of present illness Lonnie [name] is a 70 y.o. male who underwent peripheral vascular thrombectomy of R LE and graft placement 6/17 and discharged to home. Pt readmitted 6/25 due to AMS, AKI, metabolic encephalopathy. He was found to be COVID+. He underwent exploration of R below knee popliteal wound and noted with acute ischemic wounds and gangrene R foot as well as graft infection. 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 Patient s/p complete graft removal 7/18. There was a vein patch with prolene suture PMH: dementia, PAD, tobacco use disorder, chronic depression/anxiety, impaired vision, iron deficiency anemia, peripheral neuropathy, essential HTN, HLD, GERD, BPH, falls, SDH s/p crani (2020).   OT comments  Pt making good progress with functional goals. Pt in bed upon arrival, pleasant and smiling. Pt agreeable to EOB and OOB activity. Session focused on sitting EOB, UB dressing, LB bathing and LB dressing, activity tolerance sitting EOB, SPT to BSC. Pt impulsive and required cues for safety and redirection for task continuation. OT will continue to follow acutely to maximize level of function and safety  Follow Up Recommendations  SNF;Supervision/Assistance - 24 hour    Equipment Recommendations  3 in 1 bedside commode;Wheelchair (measurements OT);Wheelchair cushion (measurements OT);Other (comment) (TBD at SNF)    Recommendations for Other Services      Precautions / Restrictions Precautions Precautions: Fall Precaution Comments: R AKA Restrictions Weight Bearing Restrictions: Yes RLE Weight Bearing: Non weight bearing       Mobility Bed Mobility Overal bed mobility: Needs Assistance Bed Mobility: Supine to Sit     Supine to sit: Supervision Sit to supine: Supervision;HOB elevated         Transfers Overall transfer level: Needs assistance Equipment used: Rolling walker (2 wheeled) Transfers: Sit to/from Stand Sit to Stand: Mod assist Stand pivot transfers: Mod assist       General transfer comment: pt impulsive, required cues and redirection    Balance Overall balance assessment: Needs assistance Sitting-balance support: Feet supported;No upper extremity supported Sitting balance-Leahy Scale: Fair Sitting balance - Comments: static sitting EOB no LOB   Standing balance support: Bilateral upper extremity supported;During functional activity Standing balance-Leahy Scale: Poor                             ADL either performed or assessed with clinical judgement   ADL Overall ADL's : Needs assistance/impaired     Grooming: Wash/dry hands;Wash/dry face;Oral care;Sitting;Set up;Supervision/safety       Lower Body Bathing: Moderate assistance;Sitting/lateral leans Lower Body Bathing Details (indicate cue type and reason): simulated seated EOB Upper Body Dressing : Min guard;Sitting Upper Body Dressing Details (indicate cue type and reason): donned clean gown seated EOB Lower Body Dressing: Maximal assistance Lower Body Dressing Details (indicate cue type and reason): donning L sock seated EOB Toilet Transfer: Moderate assistance;Stand-pivot;BSC;RW;Cueing for safety;Cueing for sequencing   Toileting- Clothing Manipulation and Hygiene: Maximal assistance;Sit to/from stand       Functional mobility during ADLs: Moderate assistance;Cueing for safety;Cueing for sequencing;Rolling walker       Vision Baseline Vision/History: Wears glasses Wears Glasses: At all times Patient Visual Report: No change from baseline     Perception     Praxis      Cognition Arousal/Alertness: Awake/alert Behavior During Therapy: WFL for tasks assessed/performed Overall Cognitive  Status: History of cognitive impairments - at baseline Area of Impairment:  Safety/judgement;Following commands;Problem solving;Memory;Attention                 Orientation Level: Disoriented to;Time   Memory: Decreased short-term memory Following Commands: Follows one step commands with increased time;Follows multi-step commands inconsistently Safety/Judgement: Decreased awareness of safety;Decreased awareness of deficits   Problem Solving: Slow processing;Difficulty sequencing;Requires verbal cues;Requires tactile cues General Comments: Hx of dementia, increased time for following directions, safety and awareness        Exercises     Shoulder Instructions       General Comments      Pertinent Vitals/ Pain       Pain Assessment: Faces Faces Pain Scale: Hurts a little bit Pain Location: R residual limb (after premedication) Pain Descriptors / Indicators: Grimacing;Guarding;Sore Pain Intervention(s): Monitored during session;Premedicated before session;Repositioned  Home Living                                          Prior Functioning/Environment              Frequency  Min 2X/week        Progress Toward Goals  OT Goals(current goals can now be found in the care plan section)  Progress towards OT goals: Progressing toward goals  Acute Rehab OT Goals Patient Stated Goal: "go to rehab place and go home"  Plan Discharge plan remains appropriate    Co-evaluation                 AM-PAC OT "6 Clicks" Daily Activity     Outcome Measure   Help from another person eating meals?: None Help from another person taking care of personal grooming?: A Little Help from another person toileting, which includes using toliet, bedpan, or urinal?: A Lot Help from another person bathing (including washing, rinsing, drying)?: A Lot Help from another person to put on and taking off regular upper body clothing?: A Little Help from another person to put on and taking off regular lower body clothing?: A Lot 6 Click Score:  16    End of Session Equipment Utilized During Treatment: Gait belt;Rolling walker;Other (comment) (BSC)  OT Visit Diagnosis: Unsteadiness on feet (R26.81);Other abnormalities of gait and mobility (R26.89);Muscle weakness (generalized) (M62.81);Pain;Other symptoms and signs involving cognitive function Pain - Right/Left: Right Pain - part of body: Leg   Activity Tolerance Patient tolerated treatment well   Patient Left with call bell/phone within reach;in bed;with bed alarm set   Nurse Communication          Time: 6010-9323 OT Time Calculation (min): 26 min  Charges: OT General Charges $OT Visit: 1 Visit OT Treatments $Self Care/Home Management : 8-22 mins $Therapeutic Activity: 8-22 mins     Galen Manila 07/12/2021, 11:16 AM

## 2021-07-13 LAB — ANAEROBIC CULTURE W GRAM STAIN

## 2021-07-15 ENCOUNTER — Telehealth: Payer: Self-pay | Admitting: *Deleted

## 2021-07-15 ENCOUNTER — Encounter (HOSPITAL_COMMUNITY): Payer: Medicare Other

## 2021-07-15 DIAGNOSIS — I10 Essential (primary) hypertension: Secondary | ICD-10-CM | POA: Diagnosis not present

## 2021-07-15 DIAGNOSIS — I739 Peripheral vascular disease, unspecified: Secondary | ICD-10-CM | POA: Diagnosis not present

## 2021-07-15 DIAGNOSIS — Z4889 Encounter for other specified surgical aftercare: Secondary | ICD-10-CM | POA: Diagnosis not present

## 2021-07-15 MED ORDER — CLONAZEPAM 0.25 MG PO TBDP: 0.2500 mg | ORAL_TABLET | Freq: Two times a day (BID) | ORAL | 0 refills | Status: DC | PRN

## 2021-07-15 MED ORDER — CLONAZEPAM 0.25 MG PO TBDP: 0.2500 mg | ORAL_TABLET | Freq: Two times a day (BID) | ORAL | 0 refills | Status: DC

## 2021-07-15 NOTE — Telephone Encounter (Signed)
See new prescription, please print and fax

## 2021-07-15 NOTE — Telephone Encounter (Signed)
Spoke with Westley Hummer at the [facility] and she stated that we need to change to order and fax to her attention [phone].  It looks like Dr. Jacqulyn Bath changed the order to 1 bid prn seizures.  You usually write this as 1 twice daily to where he takes it every day per daughter and its in the history.    Per daughter Dr. Jacqulyn Bath was very unprofessional and was downing them about this issue.

## 2021-07-15 NOTE — Telephone Encounter (Signed)
Please call the facility. The patient has been on clonazepam 0.25 mg twice daily for a long time. Typically, they physician at the facility refill the medication.  Nevertheless I am willing to send a prescription if needed.

## 2021-07-15 NOTE — Telephone Encounter (Signed)
Rx faxed to [facility] attention to [facility].

## 2021-07-15 NOTE — Telephone Encounter (Signed)
Call Type Triage / Clinical Caller Name [name] Brumley Relationship To Patient Daughter Return Phone Number [phone] (Primary) Chief Complaint Angry or Violent Behavior Reason for Call Symptomatic / Request for Health Information Initial Comment Caller states her father has just been released from the hospital to a rehab facility. The doctor that discharged him changed one of the medications. Rehab facility wont give the medication to him and patient is acting out now that he is not on medication. Medication is klonzapam Translation No No Triage Reason Other Nurse Assessment Nurse: Leana Roe, RN, Bonita Quin Date/Time (Eastern Time): 07/14/2021 1:44:16 PM Confirm and document reason for call. If symptomatic, describe symptoms. ---Caller states her father has just been released from the hospital to a rehab facility. He was admitted for almost a month, had an amputation. The doctor that discharged him changed one of the medications. Rehab facility won't give the medication to him and patient is acting out now that he is not on medication. Medication is clonazapam. He has been on it for 2-3 years. He is in [address] [facility] [address] in [place]. Caller is not with patient. Does the patient have any new or worsening symptoms? ---Yes

## 2021-07-18 DIAGNOSIS — R7881 Bacteremia: Secondary | ICD-10-CM | POA: Diagnosis not present

## 2021-07-18 DIAGNOSIS — I5042 Chronic combined systolic (congestive) and diastolic (congestive) heart failure: Secondary | ICD-10-CM | POA: Diagnosis not present

## 2021-07-18 DIAGNOSIS — T827XXD Infection and inflammatory reaction due to other cardiac and vascular devices, implants and grafts, subsequent encounter: Secondary | ICD-10-CM | POA: Diagnosis not present

## 2021-07-18 DIAGNOSIS — I739 Peripheral vascular disease, unspecified: Secondary | ICD-10-CM | POA: Diagnosis not present

## 2021-07-18 DIAGNOSIS — D500 Iron deficiency anemia secondary to blood loss (chronic): Secondary | ICD-10-CM | POA: Diagnosis not present

## 2021-07-19 ENCOUNTER — Ambulatory Visit: Payer: Medicare Other | Admitting: Podiatry

## 2021-07-19 ENCOUNTER — Other Ambulatory Visit: Payer: Self-pay

## 2021-07-19 DIAGNOSIS — I739 Peripheral vascular disease, unspecified: Secondary | ICD-10-CM

## 2021-07-23 ENCOUNTER — Other Ambulatory Visit: Payer: Self-pay

## 2021-07-23 ENCOUNTER — Encounter: Payer: Self-pay | Admitting: Internal Medicine

## 2021-07-23 ENCOUNTER — Ambulatory Visit: Payer: Medicare Other | Admitting: Internal Medicine

## 2021-07-23 VITALS — BP 119/72 | HR 70 | Temp 98.7°F

## 2021-07-23 DIAGNOSIS — T827XXD Infection and inflammatory reaction due to other cardiac and vascular devices, implants and grafts, subsequent encounter: Secondary | ICD-10-CM | POA: Diagnosis not present

## 2021-07-23 NOTE — Patient Instructions (Signed)
Finish iv cefepime on 8/16; remove picc then   Start ciprofloxacin 500 mg orally twice a day on 8/16; will plan for that at least 3 months until end of 10/2021  See me again in 6 weeks   Please have the nursing home do weekly cbc, cmp, and crp and fax to the [facility]    ----------- Nursing home order Remove picc 8/16; stop cefepime 8/16 Start ciprofloxacin 500 mg po bid on 8/16 until 11/20/2021  Follow up with dr Renold Don 6 weeks from today

## 2021-07-23 NOTE — Progress Notes (Signed)
[facility]  Patient Active Problem List   Diagnosis Date Noted   Infected prosthetic vascular graft ([place])    Bacteremia due to Pseudomonas    Altered mental status    Status post above-knee amputation of right lower extremity ([place])    AKI (acute kidney injury) ([place]) 06/15/2021   Ischemic cardiomyopathy    Sepsis secondary to UTI ([place]) 92/42/6834   Acute metabolic encephalopathy 19/62/2297   Closed fracture of right patella 09/10/2020   PAD (peripheral artery disease) ([place]) 01/20/2020   Fronto-temporal dementia ([place]) 08/04/2019   Left shoulder pain    TBI (traumatic brain injury) ([place])    E-coli UTI    Urinary retention    Postoperative pain    Agitation    SDH (subdural hematoma) ([place]) 07/08/2019   Subdural hematoma ([place]) 06/30/2019   Erectile dysfunction 07/28/2018   Peripheral vascular disease ([place]) 07/16/2017   Anxiety and depression 07/16/2017   PCP NOTES >>>>>>>>>>>> 07/08/2016   Dermatitis 04/19/2015   Elevated LFTs 02/12/2015   Hyperglycemia 09/01/2014   DJD (degenerative joint disease) 09/01/2014   Annual physical exam 09/01/2014   S/P CABG x 4 11/07/2013   HTN (hypertension) 10/29/2013   Tobacco abuse 10/29/2013   Coronary atherosclerosis of native coronary artery 10/29/2013      Subjective:    Patient ID: Lonnie [name], male    DOB: [birth year], 70 y.o.   MRN: [id number]  No chief complaint on file.   HPI:  Lonnie [name] is a 70 y.o. male here for vascular graft infection with pseudomonas  He is tolerating cefepime without n/v/diarrhea No f/c No picc site issue   7/12 operative wound cx rle around graft site PsA  7/18 complete graft removal 7/14 bcx psA; 7/15 bcx negative   Staples still in rle bka stump   Allergies  Allergen Reactions   Ace Inhibitors Swelling and Other (See Comments)    Angioedema   Eggs Or Egg-Derived Products Nausea Only and Other (See Comments)    Cannot eat prepared  eggs- Occasional nausea   Lactose Intolerance (Gi) Diarrhea, Nausea Only and Other (See Comments)    Flatulence, also   Latex Itching      Outpatient Medications Prior to Visit  Medication Sig Dispense Refill   acetaminophen (TYLENOL) 500 MG tablet Take 1,000 mg by mouth in the morning and at bedtime.     aspirin EC 81 MG tablet Take 81 mg by mouth daily.     Calcium Carb-Cholecalciferol (CALCIUM 600+D3 PO) Take 1 tablet by mouth daily with breakfast.     ceFEPime (MAXIPIME) IVPB Inject 2 g into the vein every 8 (eight) hours for 28 days. Indication:  Vascular graft infection First Dose: No Last Day of Therapy:  08/06/2021 Labs - Once weekly:  CBC/D and BMP, Labs - Every other week:  ESR and CRP Method of administration: IV Push Method of administration may be changed at the discretion of home infusion pharmacist based upon assessment of the patient and/or caregiver's ability to self-administer the medication ordered. 84 Units 0   citalopram (CELEXA) 20 MG tablet Take 1 tablet (20 mg total) by mouth daily. 30 tablet 0   clonazePAM (KLONOPIN) 0.25 MG disintegrating tablet Take 1 tablet (0.25 mg total) by mouth 2 (two) times daily. 60 tablet 0   clopidogrel (PLAVIX) 75 MG tablet Take 1 tablet (75 mg total) by mouth daily. 90 tablet 3   docusate sodium (COLACE) 100 MG  capsule Take 100 mg by mouth daily.     furosemide (LASIX) 20 MG tablet Take 1 tablet (20 mg total) by mouth daily as needed (ankle swelling). 90 tablet 0   gabapentin (NEURONTIN) 100 MG capsule Take 1 capsule (100 mg total) by mouth 2 (two) times daily. 180 capsule 1   irbesartan (AVAPRO) 300 MG tablet Take 1 tablet (300 mg total) by mouth daily. 90 tablet 1   Liniments (BEN GAY EX) Apply 1 application topically daily as needed (for back pain).     melatonin 5 MG TABS Take 10 mg by mouth every evening.     metoprolol tartrate (LOPRESSOR) 25 MG tablet Take 0.5 tablets (12.5 mg total) by mouth 2 (two) times daily. 30 tablet 0    Multiple Vitamin (MULTIVITAMIN WITH MINERALS) TABS tablet Take 1 tablet by mouth daily.     nicotine (NICODERM CQ - DOSED IN MG/24 HR) 7 mg/24hr patch Place 1 patch (7 mg total) onto the skin daily. 28 patch 0   oxyCODONE (ROXICODONE) 5 MG immediate release tablet Take 1 tablet (5 mg total) by mouth every 6 (six) hours as needed. 20 tablet 0   pantoprazole (PROTONIX) 40 MG tablet Take 1 tablet (40 mg total) by mouth at bedtime. 90 tablet 3   QUEtiapine (SEROQUEL) 200 MG tablet Take 1 tablet (200 mg total) by mouth 2 (two) times daily. 180 tablet 1   tamsulosin (FLOMAX) 0.4 MG CAPS capsule Take 2 capsules (0.8 mg total) by mouth daily after supper. 180 capsule 3   No facility-administered medications prior to visit.     Social History   Socioeconomic History   Marital status: Married    Spouse name: [name]   Number of children: 1   Years of education: Not on file   Highest education level: Not on file  Occupational History   Occupation: long term disability --Futures trader: [employer]  Tobacco Use   Smoking status: Every Day    Packs/day: 0.25    Years: 30.00    Pack years: 7.50    Types: Cigarettes   Smokeless tobacco: Former    Quit date: 05/2021   Tobacco comments:    < 1/2 ppd  Vaping Use   Vaping Use: Never used  Substance and Sexual Activity   Alcohol use: No    Alcohol/week: 0.0 standard drinks   Drug use: No   Sexual activity: Yes  Other Topics Concern   Not on file  Social History Narrative   Household-- pt and wife   1 daughter    Social Determinants of Health   Financial Resource Strain: Low Risk    Difficulty of Paying Living Expenses: Not hard at all  Food Insecurity: No Food Insecurity   Worried About Charity fundraiser in the Last Year: Never true   Arboriculturist in the Last Year: Never true  Transportation Needs: No Transportation Needs   Lack of Transportation (Medical): No   Lack of Transportation (Non-Medical): No  Physical Activity:  Inactive   Days of Exercise per Week: 0 days   Minutes of Exercise per Session: 0 min  Stress: No Stress Concern Present   Feeling of Stress : Not at all  Social Connections: Moderately Isolated   Frequency of Communication with Friends and Family: More than three times a week   Frequency of Social Gatherings with Friends and Family: More than three times a week   Attends Religious Services: Never  Active Member of Clubs or Organizations: No   Attends Archivist Meetings: Never   Marital Status: Married  Human resources officer Violence: Not At Risk   Fear of Current or Ex-Partner: No   Emotionally Abused: No   Physically Abused: No   Sexually Abused: No      Review of Systems    All other ros negative  Objective:    BP 119/72   Pulse 70   Temp 98.7 F (37.1 C) (Oral)   SpO2 99%  Nursing note and vital signs reviewed.  Physical Exam     General/constitutional: no distress, pleasant HEENT: Normocephalic, PER, Conj Clear, EOMI, Oropharynx clear Neck supple CV: rrr no mrg Lungs: clear to auscultation, normal respiratory effort Abd: Soft, Nontender Ext: no edema Skin: No Rash Neuro: nonfocal MSK: right bka stump staples still there; incision healed no dehiscense or swelling or tenderness   Central line presence: Rue picc site no erythema/purulence/discharge   Labs: Lab Results  Component Value Date   WBC 17.2 (H) 07/10/2021   HGB 8.4 (L) 07/10/2021   HCT 26.1 (L) 07/10/2021   MCV 82.6 07/10/2021   PLT 503 (H) 19/50/9326   Last metabolic panel Lab Results  Component Value Date   GLUCOSE 109 (H) 07/10/2021   NA 136 07/10/2021   K 3.9 07/10/2021   CL 105 07/10/2021   CO2 26 07/10/2021   BUN 8 07/10/2021   CREATININE 0.68 07/10/2021   GFRNONAA >60 07/10/2021   GFRAA >60 09/12/2020   CALCIUM 8.4 (L) 07/10/2021   PHOS 3.1 07/10/2021   PROT 5.8 (L) 07/10/2021   ALBUMIN 2.1 (L) 07/10/2021   BILITOT 0.8 07/10/2021   ALKPHOS 135 (H) 07/10/2021    AST 49 (H) 07/10/2021   ALT 140 (H) 07/10/2021   ANIONGAP 5 07/10/2021       Component Value Date/Time   CRP 23.9 (H) 06/21/2021 0603   CRP 14.1 (H) 06/20/2021 0336   CRP 7.6 (H) 06/19/2021 1357      Micro:  Serology:  Imaging:  Assessment & Plan:   Problem List Items Addressed This Visit   None Visit Diagnoses     Vascular graft infection, subsequent encounter    -  Primary         No orders of the defined types were placed in this encounter.    CC: Pseudomonas bsi, vascular graft infection   Lines: peripheral   Abx: 7/14-c cefepime   ASSESSMENT: 70 yo with hx CAD s/p CABG x4 2014, subdural hematoma s/p craniotomy (2020, with residual balance deficits and delayed processing), ischemic cardiomyopathy, right popliteal bypass with PTFE graft 01/23/2020, recent admission 06/07/21 with occluded R fem pop bypass s/p thrombolysis, mechanical thrombectomy of R femoropoliteal bypass graft, stent x2 of the right femoral bypass graft, readmitted 6/25 for confusion/weakness found to have mild covid infection s/p remdesivir-steroid and multiple bacterial species in urine s/p 5 day abx, course complicated by hemorrhage blood loss from right fem-pop surgical site s/p exploration 7/07, right foot gangrene s/p aka 7/12 during which sign of infection around the graft was found (cx PsA), and subsequently pseudomonas bsi on 7/14.   7/12 operative wound cx RLE psA 7/14 bcx psA (S cipro, cefepime) 7/15 bcx negative   Suspect graft infection after 6/17 admission with AKA finding and wound cx soft tissue PsA   7/19 assessment Patient s/p complete graft removal 7/18. I reviewed op-note and discussed with vascular surgery. There was a vein patch with prolene suture Patient clinically  doing well      8/2 assessment Doing well No sign of active infection Tolerating cefepime   PLAN: Continue cefepime until 8/16; on then stop and remove picc and start cipro 500 mg po bid F/u with  me in 6 weeks Orders for snf to do weekly cbc, cmp and crp and fax to [facility]id clinic Plan 3-6 months of cipro, but will decide again after 3 months  Follow-up: Return in about 6 weeks (around 09/03/2021).   I have spent a total of 20 minutes of face-to-face and non-face-to-face time, excluding clinical staff time, preparing to see patient, ordering tests and/or medications, and provide counseling the patient       Jabier Mutton, [facility] [phone]  pager   [phone] cell 07/23/2021, 4:12 PM

## 2021-07-24 ENCOUNTER — Telehealth: Payer: Self-pay | Admitting: *Deleted

## 2021-07-24 DIAGNOSIS — T827XXD Infection and inflammatory reaction due to other cardiac and vascular devices, implants and grafts, subsequent encounter: Secondary | ICD-10-CM | POA: Diagnosis not present

## 2021-07-24 DIAGNOSIS — Z4889 Encounter for other specified surgical aftercare: Secondary | ICD-10-CM | POA: Diagnosis not present

## 2021-07-24 DIAGNOSIS — I739 Peripheral vascular disease, unspecified: Secondary | ICD-10-CM | POA: Diagnosis not present

## 2021-07-24 NOTE — Telephone Encounter (Signed)
Patient's skilled nursing facility did not send documentation or order sheet with patient to [facility] visit 07/23/21. Dr Renold Don included order for SNF on after visit summary. RN gave copy to patient's family member to ensure they were followed. Andree Coss, RN

## 2021-07-29 ENCOUNTER — Other Ambulatory Visit: Payer: Self-pay | Admitting: *Deleted

## 2021-07-29 ENCOUNTER — Ambulatory Visit (INDEPENDENT_AMBULATORY_CARE_PROVIDER_SITE_OTHER): Payer: Medicare Other | Admitting: Surgery

## 2021-07-29 ENCOUNTER — Other Ambulatory Visit: Payer: Self-pay

## 2021-07-29 ENCOUNTER — Encounter: Payer: Self-pay | Admitting: Surgery

## 2021-07-29 ENCOUNTER — Ambulatory Visit (HOSPITAL_COMMUNITY)
Admission: RE | Admit: 2021-07-29 | Discharge: 2021-07-29 | Disposition: A | Discharge status: Home / Self Care | Payer: Medicare Other | Source: Ambulatory Visit | Attending: Surgery | Admitting: Surgery

## 2021-07-29 VITALS — BP 132/75 | HR 72 | Temp 98.1°F | Resp 20 | Ht 76.0 in | Wt 140.0 lb

## 2021-07-29 DIAGNOSIS — I739 Peripheral vascular disease, unspecified: Secondary | ICD-10-CM | POA: Diagnosis not present

## 2021-07-29 DIAGNOSIS — I70245 Atherosclerosis of native arteries of left leg with ulceration of other part of foot: Secondary | ICD-10-CM
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
## 2021-07-31 ENCOUNTER — Other Ambulatory Visit: Payer: Self-pay

## 2021-07-31 DIAGNOSIS — I739 Peripheral vascular disease, unspecified: Secondary | ICD-10-CM | POA: Diagnosis not present

## 2021-07-31 DIAGNOSIS — Z4889 Encounter for other specified surgical aftercare: Secondary | ICD-10-CM | POA: Diagnosis not present

## 2021-07-31 DIAGNOSIS — T827XXD Infection and inflammatory reaction due to other cardiac and vascular devices, implants and grafts, subsequent encounter: Secondary | ICD-10-CM | POA: Diagnosis not present

## 2021-07-31 DIAGNOSIS — I70245 Atherosclerosis of native arteries of left leg with ulceration of other part of foot: Secondary | ICD-10-CM

## 2021-08-06 DIAGNOSIS — N179 Acute kidney failure, unspecified: Secondary | ICD-10-CM | POA: Diagnosis not present

## 2021-08-06 DIAGNOSIS — M6281 Muscle weakness (generalized): Secondary | ICD-10-CM | POA: Diagnosis not present

## 2021-08-06 DIAGNOSIS — Z89611 Acquired absence of right leg above knee: Secondary | ICD-10-CM | POA: Diagnosis not present

## 2021-08-07 DIAGNOSIS — T827XXD Infection and inflammatory reaction due to other cardiac and vascular devices, implants and grafts, subsequent encounter: Secondary | ICD-10-CM | POA: Diagnosis not present

## 2021-08-07 DIAGNOSIS — M6281 Muscle weakness (generalized): Secondary | ICD-10-CM | POA: Diagnosis not present

## 2021-08-07 DIAGNOSIS — I739 Peripheral vascular disease, unspecified: Secondary | ICD-10-CM | POA: Diagnosis not present

## 2021-08-07 DIAGNOSIS — Z89611 Acquired absence of right leg above knee: Secondary | ICD-10-CM | POA: Diagnosis not present

## 2021-08-07 DIAGNOSIS — Z8616 Personal history of COVID-19: Secondary | ICD-10-CM | POA: Diagnosis not present

## 2021-08-07 DIAGNOSIS — E46 Unspecified protein-calorie malnutrition: Secondary | ICD-10-CM | POA: Diagnosis not present

## 2021-08-07 DIAGNOSIS — I5042 Chronic combined systolic (congestive) and diastolic (congestive) heart failure: Secondary | ICD-10-CM | POA: Diagnosis not present

## 2021-08-09 ENCOUNTER — Telehealth: Payer: Self-pay

## 2021-08-09 NOTE — Telephone Encounter (Signed)
FYI

## 2021-08-09 NOTE — Telephone Encounter (Signed)
Noted, thank you

## 2021-08-09 NOTE — Telephone Encounter (Signed)
Patient's wife called stating patient was discharged from [facility] health care rehab but he had a recent fall on 08/08/2021 at 5:30 pm , rehab told patient to follow up with PCP appointment made for 08/13/21.  Patient doesn't have any bruising or injuries , patient states back is sore from fall and also arms since they were stuck in his wheelchair.

## 2021-08-12 ENCOUNTER — Ambulatory Visit (HOSPITAL_BASED_OUTPATIENT_CLINIC_OR_DEPARTMENT_OTHER): Payer: Medicare Other | Admitting: Family

## 2021-08-13 ENCOUNTER — Ambulatory Visit (INDEPENDENT_AMBULATORY_CARE_PROVIDER_SITE_OTHER): Payer: Medicare Other | Admitting: Internal Medicine

## 2021-08-13 ENCOUNTER — Other Ambulatory Visit: Payer: Self-pay

## 2021-08-13 ENCOUNTER — Encounter: Payer: Self-pay | Admitting: Internal Medicine

## 2021-08-13 VITALS — BP 116/82 | HR 75 | Temp 98.6°F | Resp 16 | Ht 76.0 in

## 2021-08-13 DIAGNOSIS — G3109 Other frontotemporal dementia: Secondary | ICD-10-CM

## 2021-08-13 DIAGNOSIS — I739 Peripheral vascular disease, unspecified: Secondary | ICD-10-CM | POA: Diagnosis not present

## 2021-08-13 DIAGNOSIS — Z720 Tobacco use: Secondary | ICD-10-CM

## 2021-08-13 DIAGNOSIS — F0280 Dementia in other diseases classified elsewhere without behavioral disturbance: Secondary | ICD-10-CM

## 2021-08-13 DIAGNOSIS — Z89611 Acquired absence of right leg above knee: Secondary | ICD-10-CM | POA: Diagnosis not present

## 2021-08-13 DIAGNOSIS — F32A Depression, unspecified: Secondary | ICD-10-CM

## 2021-08-13 DIAGNOSIS — F419 Anxiety disorder, unspecified: Secondary | ICD-10-CM | POA: Diagnosis not present

## 2021-08-13 DIAGNOSIS — I10 Essential (primary) hypertension: Secondary | ICD-10-CM

## 2021-08-13 MED ORDER — NICOTINE 7 MG/24HR TD PT24: 7.0000 mg | MEDICATED_PATCH | Freq: Every day | TRANSDERMAL | 0 refills | Status: DC | PRN

## 2021-08-13 NOTE — Progress Notes (Signed)
Subjective:    Patient ID: Lonnie [name], male    DOB: 1951/03/11, 70 y.o.   MRN: [id number]  DOS:  08/13/2021 Type of visit - description: Hospital follow-up, here with his daughter  Since his last visit 05/24/2021, he had multiple surgeries procedure, see surgical history, eventually a right femoral bypass was removed due to infection and he had a R AKA. Since he left the hospital, has seen infectious diseases and vascular surgery.  Hospital notes, recent labs and outpatient notes from ID and vascular reviewed.  The patient's daughter reports that after he left the hospital, doses of citalopram and clonazepam were changed and he was not doing very well emotionally.  That has been corrected.  The family is not  providing cigarettes to him, request a nicotine patch.  No fever chills No chest pain no difficulty breathing No blood in the stools  Did have 2 falls recently, his left leg is still very weak.  Landed on his back, denies any head or neck injury, no LOC    Review of Systems See above   Past Medical History:  Diagnosis Date   Allergy    Anxiety    Arthritis    left neck, shoulder, knee   CAD (coronary artery disease)    a. anterior STEMI 10/2013 s/p 4V CABG with LIMA to mid LAD, SVG to OM, SVG to PDA, SVG to Diagonal.   Chronic combined systolic and diastolic CHF (congestive heart failure) (HCC)    Dementia (HCC)    Depression    Falls    Hypertension    Ischemic cardiomyopathy    a. EF 40-45% at time of CABG and in 2018.   MI (myocardial infarction) (HCC)    Peripheral neuropathy    Prediabetes 09/01/2014   PVD (peripheral vascular disease) (HCC)    a. s/p L SFA stents with now known bilateral SFA. b. right femoral to below the knee bypass by Dr. Myra GianottiBrabham in 12/2019, c/b infection 02/2020.   Stroke [facility](HCC)    seen on CT Scan   Subdural hematoma (HCC)    Tobacco abuse    UTI (urinary tract infection)     Past Surgical History:  Procedure Laterality Date    ABDOMINAL AORTAGRAM N/A 06/07/2014   Procedure: ABDOMINAL Ronny FlurryAORTAGRAM;  Surgeon: Iran OuchMuhammad A Arida, MD;  Location: [facility] CATH LAB;  Service: Cardiovascular;  Laterality: N/A;   ABDOMINAL AORTAGRAM N/A 03/07/2015   Procedure: ABDOMINAL Ronny FlurryAORTAGRAM;  Surgeon: Iran OuchMuhammad A Arida, MD;  Location: [facility] CATH LAB;  Service: Cardiovascular;  Laterality: N/A;   ABDOMINAL AORTOGRAM W/LOWER EXTREMITY N/A 01/18/2020   Procedure: ABDOMINAL AORTOGRAM W/LOWER EXTREMITY - Right;  Surgeon: Iran OuchArida, Muhammad A, MD;  Location: [facility] INVASIVE CV LAB;  Service: Cardiovascular;  Laterality: N/A;   AMPUTATION Right 07/02/2021   Procedure: RIGHT ABOVE KNEE AMPUTATION;  Surgeon: Sherren KernsFields, Charles E, MD;  [facility];  Service: Vascular;  Laterality: Right;   ANGIOPLASTY Right 06/08/2021   Procedure: BALLOON ANGIOPLASTY;  Surgeon: Nada LibmanBrabham, Vance W, MD;  [facility];  Service: Vascular;  Laterality: Right;   APPENDECTOMY     APPLICATION OF WOUND VAC Right 06/27/2021   Procedure: APPLICATION OF WOUND VAC;  Surgeon: Cephus Shellinglark, Christopher J, MD;  Location: [facility] OR;  Service: Vascular;  Laterality: Right;   COLONOSCOPY WITH PROPOFOL N/A 10/11/2015   Procedure: COLONOSCOPY WITH PROPOFOL;  Surgeon: Charna ElizabethJyothi Mann, MD;  Location: [facility] ENDOSCOPY;  Service: Endoscopy;  Laterality: N/A;   CORONARY ARTERY BYPASS GRAFT N/A 11/04/2013  Procedure: CORONARY ARTERY BYPASS GRAFTING (CABG) TIMES FOUR  USING LEFT INTERNAL MAMMARY ARTERY AND RIGHT AND LEFT SAPHENOUS LEG VEIN HARVESTED ENDOSCOPICALLY;  Surgeon: Loreli Slot, MD;  Location[facility] OR;  Service: Open Heart Surgery;  Laterality: N/A;   CRANIOTOMY Left 06/30/2019   Procedure: Frontal CRANIOTOMY HEMATOMA EVACUATION SUBDURAL;  Surgeon: Lisbeth Renshaw, MD;  Location: [facility] OR;  Service: Neurosurgery;  Laterality: Left;  Frontal CRANIOTOMY HEMATOMA EVACUATION SUBDURAL   FEMORAL-POPLITEAL BYPASS GRAFT Right 01/20/2020   Procedure: BYPASS GRAFT FEMORAL-POPLITEAL ARTERY RIGHT LEG USING GORE PROPATEN GRAFT;   Surgeon: Nada Libman, MD;  Location: [facility] OR;  Service: Vascular;  Laterality: Right;   FINGER SURGERY  2017   injury   I & D EXTREMITY Right 03/02/2020   Procedure: IRRIGATION AND DEBRIDEMENT EXTREMITY right  lower leg  with Antibiotic beads.;  Surgeon: Chuck Hint, MD;  Location: [facility];  Service: Vascular;  Laterality: Right;   I & D EXTREMITY Right 06/27/2021   Procedure: IRRIGATION AND DEBRIDEMENT OF RIGHT LEG;  Surgeon: Cephus Shelling, MD;  [facility]: [facility] OR;  Service: Vascular;  Laterality: Right;   INSERTION OF ILIAC STENT Right 06/08/2021   Procedure: INSERTION OF RIGHT UPPER LEG STENT;  Surgeon: Nada Libman, MD;  [facility]: [facility] OR;  Service: Vascular;  Laterality: Right;   KNEE SURGERY     fractured patella   LEFT HEART CATH N/A 10/29/2013   Procedure: LEFT HEART CATH;  Surgeon: Kathleene Hazel, MD;  Location: [facility] CATH LAB;  Service: Cardiovascular;  Laterality: N/A;   LEFT HEART CATHETERIZATION WITH CORONARY ANGIOGRAM N/A 10/31/2013   Procedure: LEFT HEART CATHETERIZATION WITH CORONARY ANGIOGRAM;  Surgeon: Kathleene Hazel, MD;  Location: [facility] CATH LAB;  Service: Cardiovascular;  Laterality: N/A;   LIGATION OF CILIAC ARTERY Right 06/27/2021   Procedure: LIGATION OF POPITEAL ARTERY;  Surgeon: Cephus Shelling, MD;  Location[facility] OR;  Service: Vascular;  Laterality: Right;   LOWER EXTREMITY ANGIOGRAPHY Right 06/07/2021   Procedure: Lower Extremity Angiography;  Surgeon: Sherren Kerns, MD;  Location: [facility] INVASIVE CV LAB;  Service: Cardiovascular;  Laterality: Right;  W/ ABD    MOUTH SURGERY     PATCH ANGIOPLASTY Right 07/08/2021   Procedure: PATCH ANGIOPLASTY WITH 1X6 Kathleen Lime;  Surgeon: Sherren Kerns, MD;  Location: [facility] OR;  Service: Vascular;  Laterality: Right;   PERIPHERAL VASCULAR THROMBECTOMY Right 06/07/2021   Procedure: PERIPHERAL VASCULAR THROMBECTOMY;  Surgeon: Sherren Kerns, MD;  [facility] INVAS[facility] CV LAB;  Service: Cardiovascular;   Laterality: Right;   REMOVAL OF GRAFT Right 07/08/2021   Procedure: REMOVAL RIGHT FEMORAL BYPASS GRAFT;  Surgeon: Sherren Kerns, MD;  Location: [facility] OR;  Service: Vascular;  Laterality: Right;   TOE SURGERY     VEIN REPAIR Right 06/27/2021   Procedure: REPAIR OF POPITEAL VEIN;  Surgeon: Cephus Shelling, MD;  Location: [facility] OR;  Service: Vascular;  Laterality: Right;    Allergies as of 08/13/2021       Reactions   Ace Inhibitors Swelling, Other (See Comments)   Angioedema   Eggs Or Egg-derived Products Nausea Only, Other (See Comments)   Cannot eat prepared eggs- Occasional nausea   Lactose Intolerance (gi) Diarrhea, Nausea Only, Other (See Comments)   Flatulence, also   Latex Itching        Medication List        Accurate as of August 13, 2021  8:24 PM. If you have any questions, ask your nurse or  doctor.          acetaminophen 500 MG tablet Commonly known as: TYLENOL Take 1,000 mg by mouth in the morning and at bedtime.   aspirin EC 81 MG tablet Take 81 mg by mouth daily.   BEN GAY EX Apply 1 application topically daily as needed (for back pain).   CALCIUM 600+D3 PO Take 1 tablet by mouth daily with breakfast.   citalopram 20 MG tablet Commonly known as: CELEXA Take 1 tablet (20 mg total) by mouth daily. What changed:  how much to take how to take this when to take this additional instructions   clonazePAM 0.25 MG disintegrating tablet Commonly known as: KLONOPIN Take 1 tablet (0.25 mg total) by mouth 2 (two) times daily.   clopidogrel 75 MG tablet Commonly known as: PLAVIX Take 1 tablet (75 mg total) by mouth daily.   docusate sodium 100 MG capsule Commonly known as: COLACE Take 100 mg by mouth daily.   furosemide 20 MG tablet Commonly known as: LASIX Take 1 tablet (20 mg total) by mouth daily as needed (ankle swelling).   gabapentin 100 MG capsule Commonly known as: NEURONTIN Take 1 capsule (100 mg total) by mouth 2 (two) times daily.    irbesartan 300 MG tablet Commonly known as: AVAPRO Take 1 tablet (300 mg total) by mouth daily.   melatonin 5 MG Tabs Take 10 mg by mouth every evening.   metoprolol tartrate 25 MG tablet Commonly known as: LOPRESSOR Take 0.5 tablets (12.5 mg total) by mouth 2 (two) times daily.   multivitamin with minerals Tabs tablet Take 1 tablet by mouth daily.   nicotine 7 mg/24hr patch Commonly known as: NICODERM CQ - dosed in mg/24 hr Place 1 patch (7 mg total) onto the skin daily as needed. What changed:  when to take this reasons to take this Changed by: Willow Ora, MD   oxyCODONE 5 MG immediate release tablet Commonly known as: Roxicodone Take 1 tablet (5 mg total) by mouth every 6 (six) hours as needed.   pantoprazole 40 MG tablet Commonly known as: PROTONIX Take 1 tablet (40 mg total) by mouth at bedtime.   QUEtiapine 200 MG tablet Commonly known as: SEROQUEL Take 1 tablet (200 mg total) by mouth 2 (two) times daily.   tamsulosin 0.4 MG Caps capsule Commonly known as: FLOMAX Take 2 capsules (0.8 mg total) by mouth daily after supper.           Objective:   Physical Exam BP 116/82 (BP Location: Left Arm, Patient Position: Sitting, Cuff Size: Small)   Pulse 75   Temp 98.6 F (37 C) (Oral)   Resp 16   Ht 6\' 4"  (1.93 m)   SpO2 96%   BMI 17.04 kg/m  General:   Well developed, sits in a wheelchair, in no emotional or physical distress HEENT:  Normocephalic . Face symmetric, atraumatic Lungs:  CTA B Normal respiratory effort, no intercostal retractions, no accessory muscle use. Heart: RRR,  no murmur.  Lower extremities: R AKA, left leg with no edema Skin: Not pale. Not jaundice Neurologic:  alert & Pleasant, cooperative.  Speech normal, gait not tested  psych- Behavior appropriate. No anxious or depressed appearing.      Assessment      Assessment Prediabetes  dx 08-2014 HTN Hyperlipidemia Psych Anxiety: per pt, see OV 09/10/2019 Frontotemporal  dementia dx 06/2019 but sx started gradually long before  CV: --CAD, MI , cath 10-2013 ---> CABG --Ischemic cardiomyopathy --Peripheral vascular disease  Tobacco abuse -- intolerant to chantix before, tried Wellbutrin 2016 (not much help) Onychomycosis-- rx lamisil per podiatry 12-2015 Subdural hematoma, s/p craniotomy: admited 06/2019     PLAN: Peripheral vascular disease: Since the last office visit had several procedures, eventually had a R AKA due to infected vascular graft. Saw ID 8-2- 2022, at that time, no sign of active infection and he was recommended to finish cefepime and then start Cipro.  Initially he had Cipro IV and now the plan is to keep him on Cipro orally for few months (per daughter). Saw vascular surgery 07/29/2021, he was stable. For L peripheral vascular disease, they noted that they would attempt surgery only if he has ulcers or rest pain. Pain relatively well controlled with oxycodone sporadically Tobacco abuse: Family is not providing any cigarettes, patient is frustrated but seems to be tolerating the situation okay, nicotine patches rx sent. HTN: BP seems to be well controlled today, continue Lasix, Avapro, metoprolol. Check  CMP and CBC. Anxiety, frontotemporal dementia: after he  left hospital, citalopram and clonazepam doses were changed, he was very angry and upset, daughter realized the changes and corrected the situation, he is now back on his  routine doses and doing somewhat better. Falls: Had 2 falls recently, to start home PT soon. RTC 3 months     This visit occurred during the SARS-CoV-2 public health emergency.  Safety protocols were in place, including screening questions prior to the visit, additional usage of staff PPE, and extensive cleaning of exam room while observing appropriate contact time as indicated for disinfecting solutions.

## 2021-08-13 NOTE — Assessment & Plan Note (Signed)
Peripheral vascular disease: Since the last office visit had several procedures, eventually had a R AKA due to infected vascular graft. Saw ID 8-2- 2022, at that time, no sign of active infection and he was recommended to finish cefepime and then start Cipro.  Initially he had Cipro IV and now the plan is to keep him on Cipro orally for few months (per daughter). Saw vascular surgery 07/29/2021, he was stable. For L peripheral vascular disease, they noted that they would attempt surgery only if he has ulcers or rest pain. Pain relatively well controlled with oxycodone sporadically Tobacco abuse: Family is not providing any cigarettes, patient is frustrated but seems to be tolerating the situation okay, nicotine patches rx sent. HTN: BP seems to be well controlled today, continue Lasix, Avapro, metoprolol. Check  CMP and CBC. Anxiety, frontotemporal dementia: after he  left hospital, citalopram and clonazepam doses were changed, he was very angry and upset, daughter realized the changes and corrected the situation, he is now back on his  routine doses and doing somewhat better. Falls: Had 2 falls recently, to start home PT soon. RTC 3 months

## 2021-08-13 NOTE — Patient Instructions (Addendum)
Recommend to proceed with the following vaccines at your pharmacy:  Shingrix (shingles) Flu shot this fall     GO TO THE LAB : Get the blood work     GO TO THE FRONT DESK, PLEASE SCHEDULE YOUR APPOINTMENTS Come back for   a checkup in 3 months

## 2021-08-14 ENCOUNTER — Other Ambulatory Visit: Payer: Self-pay | Admitting: Internal Medicine

## 2021-08-14 DIAGNOSIS — I739 Peripheral vascular disease, unspecified: Secondary | ICD-10-CM | POA: Diagnosis not present

## 2021-08-14 DIAGNOSIS — B965 Pseudomonas (aeruginosa) (mallei) (pseudomallei) as the cause of diseases classified elsewhere: Secondary | ICD-10-CM | POA: Diagnosis not present

## 2021-08-14 DIAGNOSIS — G9341 Metabolic encephalopathy: Secondary | ICD-10-CM | POA: Diagnosis not present

## 2021-08-14 DIAGNOSIS — D62 Acute posthemorrhagic anemia: Secondary | ICD-10-CM | POA: Diagnosis not present

## 2021-08-14 DIAGNOSIS — Z4781 Encounter for orthopedic aftercare following surgical amputation: Secondary | ICD-10-CM | POA: Diagnosis not present

## 2021-08-14 DIAGNOSIS — F17210 Nicotine dependence, cigarettes, uncomplicated: Secondary | ICD-10-CM | POA: Diagnosis not present

## 2021-08-14 DIAGNOSIS — E7800 Pure hypercholesterolemia, unspecified: Secondary | ICD-10-CM | POA: Diagnosis not present

## 2021-08-14 DIAGNOSIS — I255 Ischemic cardiomyopathy: Secondary | ICD-10-CM | POA: Diagnosis not present

## 2021-08-14 DIAGNOSIS — I2510 Atherosclerotic heart disease of native coronary artery without angina pectoris: Secondary | ICD-10-CM | POA: Diagnosis not present

## 2021-08-14 DIAGNOSIS — I5042 Chronic combined systolic (congestive) and diastolic (congestive) heart failure: Secondary | ICD-10-CM | POA: Diagnosis not present

## 2021-08-14 DIAGNOSIS — E46 Unspecified protein-calorie malnutrition: Secondary | ICD-10-CM | POA: Diagnosis not present

## 2021-08-14 DIAGNOSIS — K760 Fatty (change of) liver, not elsewhere classified: Secondary | ICD-10-CM | POA: Diagnosis not present

## 2021-08-14 DIAGNOSIS — R7881 Bacteremia: Secondary | ICD-10-CM | POA: Diagnosis not present

## 2021-08-14 DIAGNOSIS — M1990 Unspecified osteoarthritis, unspecified site: Secondary | ICD-10-CM | POA: Diagnosis not present

## 2021-08-14 DIAGNOSIS — L309 Dermatitis, unspecified: Secondary | ICD-10-CM | POA: Diagnosis not present

## 2021-08-14 DIAGNOSIS — D509 Iron deficiency anemia, unspecified: Secondary | ICD-10-CM | POA: Diagnosis not present

## 2021-08-14 DIAGNOSIS — N179 Acute kidney failure, unspecified: Secondary | ICD-10-CM | POA: Diagnosis not present

## 2021-08-14 DIAGNOSIS — Z89611 Acquired absence of right leg above knee: Secondary | ICD-10-CM | POA: Diagnosis not present

## 2021-08-14 DIAGNOSIS — I110 Hypertensive heart disease with heart failure: Secondary | ICD-10-CM | POA: Diagnosis not present

## 2021-08-14 DIAGNOSIS — R338 Other retention of urine: Secondary | ICD-10-CM | POA: Diagnosis not present

## 2021-08-14 LAB — COMPREHENSIVE METABOLIC PANEL
ALT: 19 U/L (ref 0–53)
AST: 24 U/L (ref 0–37)
Albumin: 3.7 g/dL (ref 3.5–5.2)
Alkaline Phosphatase: 132 U/L — ABNORMAL HIGH (ref 39–117)
BUN: 16 mg/dL (ref 6–23)
CO2: 23 mEq/L (ref 19–32)
Calcium: 9.2 mg/dL (ref 8.4–10.5)
Chloride: 103 mEq/L (ref 96–112)
Creatinine, Ser: 0.92 mg/dL (ref 0.40–1.50)
GFR: 85.66 mL/min (ref >60.00)
Glucose, Bld: 122 mg/dL — ABNORMAL HIGH (ref 70–99)
Potassium: 3.9 mEq/L (ref 3.5–5.1)
Sodium: 138 mEq/L (ref 135–145)
Total Bilirubin: 0.5 mg/dL (ref 0.2–1.2)
Total Protein: 6.5 g/dL (ref 6.0–8.3)

## 2021-08-14 LAB — CBC WITH DIFFERENTIAL/PLATELET
Basophils Absolute: 0.1 10*3/uL (ref 0.0–0.1)
Basophils Relative: 0.8 % (ref 0.0–3.0)
Eosinophils Absolute: 0.7 10*3/uL (ref 0.0–0.7)
Eosinophils Relative: 7.8 % — ABNORMAL HIGH (ref 0.0–5.0)
HCT: 30.3 % — ABNORMAL LOW (ref 39.0–52.0)
Hemoglobin: 9.6 g/dL — ABNORMAL LOW (ref 13.0–17.0)
Lymphocytes Relative: 18.9 % (ref 12.0–46.0)
Lymphs Abs: 1.8 10*3/uL (ref 0.7–4.0)
MCHC: 31.8 g/dL (ref 30.0–36.0)
MCV: 75.9 fl — ABNORMAL LOW (ref 78.0–100.0)
Monocytes Absolute: 0.7 10*3/uL (ref 0.1–1.0)
Monocytes Relative: 7.4 % (ref 3.0–12.0)
Neutro Abs: 6.1 10*3/uL (ref 1.4–7.7)
Neutrophils Relative %: 65.1 % (ref 43.0–77.0)
Platelets: 346 10*3/uL (ref 150.0–400.0)
RBC: 3.99 Mil/uL — ABNORMAL LOW (ref 4.22–5.81)
RDW: 21.5 % — ABNORMAL HIGH (ref 11.5–15.5)
WBC: 9.3 10*3/uL (ref 4.0–10.5)

## 2021-08-15 ENCOUNTER — Telehealth: Payer: Self-pay | Admitting: Internal Medicine

## 2021-08-15 NOTE — Telephone Encounter (Signed)
Caller[facility]Agency: [facility] Number: [phone] Requesting OT/PT/Skilled Nursing/Social Work/Speech Therapy: [facility] Frequency: 1x week for 1week,  2x a week for 2weeks 1x a week for 2weeks

## 2021-08-16 ENCOUNTER — Telehealth: Payer: Self-pay | Admitting: Internal Medicine

## 2021-08-16 NOTE — Telephone Encounter (Signed)
Gar Gibbon from [facility] called to make Dr. Drue Novel aware that the social work visit for this patient will be delayed for next week, hopefully Monday or tuesday.

## 2021-08-16 NOTE — Telephone Encounter (Signed)
Spoke w/ Gracie- verbal orders given. 

## 2021-08-16 NOTE — Telephone Encounter (Signed)
Noted  

## 2021-08-19 MED ORDER — FERROUS FUMARATE 325 (106 FE) MG PO TABS: 1.0000 | ORAL_TABLET | Freq: Every day | ORAL | 6 refills | Status: DC

## 2021-08-19 NOTE — Addendum Note (Signed)
Addended byConrad [place] D on: 08/19/2021 08:02 AM   Modules accepted: Orders

## 2021-08-20 DIAGNOSIS — D509 Iron deficiency anemia, unspecified: Secondary | ICD-10-CM | POA: Diagnosis not present

## 2021-08-20 DIAGNOSIS — B965 Pseudomonas (aeruginosa) (mallei) (pseudomallei) as the cause of diseases classified elsewhere: Secondary | ICD-10-CM | POA: Diagnosis not present

## 2021-08-20 DIAGNOSIS — M1990 Unspecified osteoarthritis, unspecified site: Secondary | ICD-10-CM | POA: Diagnosis not present

## 2021-08-20 DIAGNOSIS — I255 Ischemic cardiomyopathy: Secondary | ICD-10-CM | POA: Diagnosis not present

## 2021-08-20 DIAGNOSIS — E7800 Pure hypercholesterolemia, unspecified: Secondary | ICD-10-CM | POA: Diagnosis not present

## 2021-08-20 DIAGNOSIS — Z4781 Encounter for orthopedic aftercare following surgical amputation: Secondary | ICD-10-CM | POA: Diagnosis not present

## 2021-08-20 DIAGNOSIS — E46 Unspecified protein-calorie malnutrition: Secondary | ICD-10-CM | POA: Diagnosis not present

## 2021-08-20 DIAGNOSIS — I110 Hypertensive heart disease with heart failure: Secondary | ICD-10-CM | POA: Diagnosis not present

## 2021-08-20 DIAGNOSIS — Z89611 Acquired absence of right leg above knee: Secondary | ICD-10-CM | POA: Diagnosis not present

## 2021-08-20 DIAGNOSIS — R7881 Bacteremia: Secondary | ICD-10-CM | POA: Diagnosis not present

## 2021-08-20 DIAGNOSIS — I739 Peripheral vascular disease, unspecified: Secondary | ICD-10-CM | POA: Diagnosis not present

## 2021-08-20 DIAGNOSIS — K760 Fatty (change of) liver, not elsewhere classified: Secondary | ICD-10-CM | POA: Diagnosis not present

## 2021-08-20 DIAGNOSIS — I5042 Chronic combined systolic (congestive) and diastolic (congestive) heart failure: Secondary | ICD-10-CM | POA: Diagnosis not present

## 2021-08-20 DIAGNOSIS — I2510 Atherosclerotic heart disease of native coronary artery without angina pectoris: Secondary | ICD-10-CM | POA: Diagnosis not present

## 2021-08-20 DIAGNOSIS — G9341 Metabolic encephalopathy: Secondary | ICD-10-CM | POA: Diagnosis not present

## 2021-08-20 DIAGNOSIS — D62 Acute posthemorrhagic anemia: Secondary | ICD-10-CM | POA: Diagnosis not present

## 2021-08-20 DIAGNOSIS — F17210 Nicotine dependence, cigarettes, uncomplicated: Secondary | ICD-10-CM | POA: Diagnosis not present

## 2021-08-20 DIAGNOSIS — R338 Other retention of urine: Secondary | ICD-10-CM | POA: Diagnosis not present

## 2021-08-20 DIAGNOSIS — N179 Acute kidney failure, unspecified: Secondary | ICD-10-CM | POA: Diagnosis not present

## 2021-08-20 DIAGNOSIS — L309 Dermatitis, unspecified: Secondary | ICD-10-CM | POA: Diagnosis not present

## 2021-08-21 ENCOUNTER — Telehealth: Payer: Self-pay

## 2021-08-21 DIAGNOSIS — D62 Acute posthemorrhagic anemia: Secondary | ICD-10-CM | POA: Diagnosis not present

## 2021-08-21 DIAGNOSIS — N179 Acute kidney failure, unspecified: Secondary | ICD-10-CM | POA: Diagnosis not present

## 2021-08-21 DIAGNOSIS — M1990 Unspecified osteoarthritis, unspecified site: Secondary | ICD-10-CM | POA: Diagnosis not present

## 2021-08-21 DIAGNOSIS — Z4781 Encounter for orthopedic aftercare following surgical amputation: Secondary | ICD-10-CM | POA: Diagnosis not present

## 2021-08-21 DIAGNOSIS — D509 Iron deficiency anemia, unspecified: Secondary | ICD-10-CM | POA: Diagnosis not present

## 2021-08-21 DIAGNOSIS — I110 Hypertensive heart disease with heart failure: Secondary | ICD-10-CM | POA: Diagnosis not present

## 2021-08-21 DIAGNOSIS — I739 Peripheral vascular disease, unspecified: Secondary | ICD-10-CM | POA: Diagnosis not present

## 2021-08-21 DIAGNOSIS — L309 Dermatitis, unspecified: Secondary | ICD-10-CM | POA: Diagnosis not present

## 2021-08-21 DIAGNOSIS — R338 Other retention of urine: Secondary | ICD-10-CM | POA: Diagnosis not present

## 2021-08-21 DIAGNOSIS — I5042 Chronic combined systolic (congestive) and diastolic (congestive) heart failure: Secondary | ICD-10-CM | POA: Diagnosis not present

## 2021-08-21 DIAGNOSIS — E46 Unspecified protein-calorie malnutrition: Secondary | ICD-10-CM | POA: Diagnosis not present

## 2021-08-21 DIAGNOSIS — R7881 Bacteremia: Secondary | ICD-10-CM | POA: Diagnosis not present

## 2021-08-21 DIAGNOSIS — Z89611 Acquired absence of right leg above knee: Secondary | ICD-10-CM | POA: Diagnosis not present

## 2021-08-21 DIAGNOSIS — G9341 Metabolic encephalopathy: Secondary | ICD-10-CM | POA: Diagnosis not present

## 2021-08-21 DIAGNOSIS — K760 Fatty (change of) liver, not elsewhere classified: Secondary | ICD-10-CM | POA: Diagnosis not present

## 2021-08-21 DIAGNOSIS — F17210 Nicotine dependence, cigarettes, uncomplicated: Secondary | ICD-10-CM | POA: Diagnosis not present

## 2021-08-21 DIAGNOSIS — B965 Pseudomonas (aeruginosa) (mallei) (pseudomallei) as the cause of diseases classified elsewhere: Secondary | ICD-10-CM | POA: Diagnosis not present

## 2021-08-21 DIAGNOSIS — E7800 Pure hypercholesterolemia, unspecified: Secondary | ICD-10-CM | POA: Diagnosis not present

## 2021-08-21 DIAGNOSIS — I2510 Atherosclerotic heart disease of native coronary artery without angina pectoris: Secondary | ICD-10-CM | POA: Diagnosis not present

## 2021-08-21 DIAGNOSIS — I255 Ischemic cardiomyopathy: Secondary | ICD-10-CM | POA: Diagnosis not present

## 2021-08-21 NOTE — Telephone Encounter (Signed)
Plan of care signed and faxed back to [facility] at [phone]. Form sent for scanning.  

## 2021-08-22 ENCOUNTER — Telehealth: Payer: Self-pay

## 2021-08-22 DIAGNOSIS — I110 Hypertensive heart disease with heart failure: Secondary | ICD-10-CM | POA: Diagnosis not present

## 2021-08-22 DIAGNOSIS — I255 Ischemic cardiomyopathy: Secondary | ICD-10-CM | POA: Diagnosis not present

## 2021-08-22 DIAGNOSIS — R338 Other retention of urine: Secondary | ICD-10-CM | POA: Diagnosis not present

## 2021-08-22 DIAGNOSIS — R7881 Bacteremia: Secondary | ICD-10-CM | POA: Diagnosis not present

## 2021-08-22 DIAGNOSIS — G9341 Metabolic encephalopathy: Secondary | ICD-10-CM | POA: Diagnosis not present

## 2021-08-22 DIAGNOSIS — F17210 Nicotine dependence, cigarettes, uncomplicated: Secondary | ICD-10-CM | POA: Diagnosis not present

## 2021-08-22 DIAGNOSIS — I739 Peripheral vascular disease, unspecified: Secondary | ICD-10-CM | POA: Diagnosis not present

## 2021-08-22 DIAGNOSIS — I2510 Atherosclerotic heart disease of native coronary artery without angina pectoris: Secondary | ICD-10-CM | POA: Diagnosis not present

## 2021-08-22 DIAGNOSIS — E46 Unspecified protein-calorie malnutrition: Secondary | ICD-10-CM | POA: Diagnosis not present

## 2021-08-22 DIAGNOSIS — D509 Iron deficiency anemia, unspecified: Secondary | ICD-10-CM | POA: Diagnosis not present

## 2021-08-22 DIAGNOSIS — Z4781 Encounter for orthopedic aftercare following surgical amputation: Secondary | ICD-10-CM | POA: Diagnosis not present

## 2021-08-22 DIAGNOSIS — K760 Fatty (change of) liver, not elsewhere classified: Secondary | ICD-10-CM | POA: Diagnosis not present

## 2021-08-22 DIAGNOSIS — E7800 Pure hypercholesterolemia, unspecified: Secondary | ICD-10-CM | POA: Diagnosis not present

## 2021-08-22 DIAGNOSIS — L309 Dermatitis, unspecified: Secondary | ICD-10-CM | POA: Diagnosis not present

## 2021-08-22 DIAGNOSIS — D62 Acute posthemorrhagic anemia: Secondary | ICD-10-CM | POA: Diagnosis not present

## 2021-08-22 DIAGNOSIS — M1990 Unspecified osteoarthritis, unspecified site: Secondary | ICD-10-CM | POA: Diagnosis not present

## 2021-08-22 DIAGNOSIS — B965 Pseudomonas (aeruginosa) (mallei) (pseudomallei) as the cause of diseases classified elsewhere: Secondary | ICD-10-CM | POA: Diagnosis not present

## 2021-08-22 DIAGNOSIS — I5042 Chronic combined systolic (congestive) and diastolic (congestive) heart failure: Secondary | ICD-10-CM | POA: Diagnosis not present

## 2021-08-22 DIAGNOSIS — N179 Acute kidney failure, unspecified: Secondary | ICD-10-CM | POA: Diagnosis not present

## 2021-08-22 DIAGNOSIS — Z89611 Acquired absence of right leg above knee: Secondary | ICD-10-CM | POA: Diagnosis not present

## 2021-08-22 MED ORDER — FERROUS FUMARATE 324 (106 FE) MG PO TABS: 1.0000 | ORAL_TABLET | Freq: Every day | ORAL | 1 refills | Status: DC

## 2021-08-22 NOTE — Telephone Encounter (Signed)
Rx changed and sent to [facility].

## 2021-08-22 NOTE — Telephone Encounter (Signed)
Received fax from [facility]- they do not have ferrous fumarate in 325mg  but they do have it in 324mg - they are requesting okay to change.

## 2021-08-22 NOTE — Telephone Encounter (Signed)
Okay to change, thank you

## 2021-08-28 DIAGNOSIS — R7881 Bacteremia: Secondary | ICD-10-CM | POA: Diagnosis not present

## 2021-08-28 DIAGNOSIS — K760 Fatty (change of) liver, not elsewhere classified: Secondary | ICD-10-CM | POA: Diagnosis not present

## 2021-08-28 DIAGNOSIS — E46 Unspecified protein-calorie malnutrition: Secondary | ICD-10-CM | POA: Diagnosis not present

## 2021-08-28 DIAGNOSIS — Z4781 Encounter for orthopedic aftercare following surgical amputation: Secondary | ICD-10-CM | POA: Diagnosis not present

## 2021-08-28 DIAGNOSIS — I255 Ischemic cardiomyopathy: Secondary | ICD-10-CM | POA: Diagnosis not present

## 2021-08-28 DIAGNOSIS — B965 Pseudomonas (aeruginosa) (mallei) (pseudomallei) as the cause of diseases classified elsewhere: Secondary | ICD-10-CM | POA: Diagnosis not present

## 2021-08-28 DIAGNOSIS — G9341 Metabolic encephalopathy: Secondary | ICD-10-CM | POA: Diagnosis not present

## 2021-08-28 DIAGNOSIS — I739 Peripheral vascular disease, unspecified: Secondary | ICD-10-CM | POA: Diagnosis not present

## 2021-08-28 DIAGNOSIS — D509 Iron deficiency anemia, unspecified: Secondary | ICD-10-CM | POA: Diagnosis not present

## 2021-08-28 DIAGNOSIS — I5042 Chronic combined systolic (congestive) and diastolic (congestive) heart failure: Secondary | ICD-10-CM | POA: Diagnosis not present

## 2021-08-28 DIAGNOSIS — I110 Hypertensive heart disease with heart failure: Secondary | ICD-10-CM | POA: Diagnosis not present

## 2021-08-28 DIAGNOSIS — Z89611 Acquired absence of right leg above knee: Secondary | ICD-10-CM | POA: Diagnosis not present

## 2021-08-28 DIAGNOSIS — M1990 Unspecified osteoarthritis, unspecified site: Secondary | ICD-10-CM | POA: Diagnosis not present

## 2021-08-28 DIAGNOSIS — N179 Acute kidney failure, unspecified: Secondary | ICD-10-CM | POA: Diagnosis not present

## 2021-08-28 DIAGNOSIS — E7800 Pure hypercholesterolemia, unspecified: Secondary | ICD-10-CM | POA: Diagnosis not present

## 2021-08-28 DIAGNOSIS — D62 Acute posthemorrhagic anemia: Secondary | ICD-10-CM | POA: Diagnosis not present

## 2021-08-28 DIAGNOSIS — L309 Dermatitis, unspecified: Secondary | ICD-10-CM | POA: Diagnosis not present

## 2021-08-28 DIAGNOSIS — F17210 Nicotine dependence, cigarettes, uncomplicated: Secondary | ICD-10-CM | POA: Diagnosis not present

## 2021-08-28 DIAGNOSIS — R338 Other retention of urine: Secondary | ICD-10-CM | POA: Diagnosis not present

## 2021-08-28 DIAGNOSIS — I2510 Atherosclerotic heart disease of native coronary artery without angina pectoris: Secondary | ICD-10-CM | POA: Diagnosis not present

## 2021-08-29 ENCOUNTER — Ambulatory Visit: Payer: Medicare Other | Admitting: Internal Medicine

## 2021-08-29 DIAGNOSIS — I5042 Chronic combined systolic (congestive) and diastolic (congestive) heart failure: Secondary | ICD-10-CM | POA: Diagnosis not present

## 2021-08-29 DIAGNOSIS — K760 Fatty (change of) liver, not elsewhere classified: Secondary | ICD-10-CM | POA: Diagnosis not present

## 2021-08-29 DIAGNOSIS — Z4781 Encounter for orthopedic aftercare following surgical amputation: Secondary | ICD-10-CM | POA: Diagnosis not present

## 2021-08-29 DIAGNOSIS — E46 Unspecified protein-calorie malnutrition: Secondary | ICD-10-CM | POA: Diagnosis not present

## 2021-08-29 DIAGNOSIS — Z89611 Acquired absence of right leg above knee: Secondary | ICD-10-CM | POA: Diagnosis not present

## 2021-08-29 DIAGNOSIS — I739 Peripheral vascular disease, unspecified: Secondary | ICD-10-CM | POA: Diagnosis not present

## 2021-08-29 DIAGNOSIS — R338 Other retention of urine: Secondary | ICD-10-CM | POA: Diagnosis not present

## 2021-08-29 DIAGNOSIS — N179 Acute kidney failure, unspecified: Secondary | ICD-10-CM | POA: Diagnosis not present

## 2021-08-29 DIAGNOSIS — D509 Iron deficiency anemia, unspecified: Secondary | ICD-10-CM | POA: Diagnosis not present

## 2021-08-29 DIAGNOSIS — D62 Acute posthemorrhagic anemia: Secondary | ICD-10-CM | POA: Diagnosis not present

## 2021-08-29 DIAGNOSIS — I2510 Atherosclerotic heart disease of native coronary artery without angina pectoris: Secondary | ICD-10-CM | POA: Diagnosis not present

## 2021-08-29 DIAGNOSIS — G9341 Metabolic encephalopathy: Secondary | ICD-10-CM | POA: Diagnosis not present

## 2021-08-29 DIAGNOSIS — M1990 Unspecified osteoarthritis, unspecified site: Secondary | ICD-10-CM | POA: Diagnosis not present

## 2021-08-29 DIAGNOSIS — I255 Ischemic cardiomyopathy: Secondary | ICD-10-CM | POA: Diagnosis not present

## 2021-08-29 DIAGNOSIS — F17210 Nicotine dependence, cigarettes, uncomplicated: Secondary | ICD-10-CM | POA: Diagnosis not present

## 2021-08-29 DIAGNOSIS — I110 Hypertensive heart disease with heart failure: Secondary | ICD-10-CM | POA: Diagnosis not present

## 2021-08-29 DIAGNOSIS — R7881 Bacteremia: Secondary | ICD-10-CM | POA: Diagnosis not present

## 2021-08-29 DIAGNOSIS — B965 Pseudomonas (aeruginosa) (mallei) (pseudomallei) as the cause of diseases classified elsewhere: Secondary | ICD-10-CM | POA: Diagnosis not present

## 2021-08-29 DIAGNOSIS — E7800 Pure hypercholesterolemia, unspecified: Secondary | ICD-10-CM | POA: Diagnosis not present

## 2021-08-29 DIAGNOSIS — L309 Dermatitis, unspecified: Secondary | ICD-10-CM | POA: Diagnosis not present

## 2021-09-03 ENCOUNTER — Telehealth: Payer: Self-pay | Admitting: Internal Medicine

## 2021-09-04 ENCOUNTER — Telehealth: Payer: Self-pay

## 2021-09-04 DIAGNOSIS — Z87820 Personal history of traumatic brain injury: Secondary | ICD-10-CM

## 2021-09-04 DIAGNOSIS — Z9181 History of falling: Secondary | ICD-10-CM

## 2021-09-04 DIAGNOSIS — Z7901 Long term (current) use of anticoagulants: Secondary | ICD-10-CM

## 2021-09-04 DIAGNOSIS — E7800 Pure hypercholesterolemia, unspecified: Secondary | ICD-10-CM | POA: Diagnosis not present

## 2021-09-04 DIAGNOSIS — Z89611 Acquired absence of right leg above knee: Secondary | ICD-10-CM | POA: Diagnosis not present

## 2021-09-04 DIAGNOSIS — Z8701 Personal history of pneumonia (recurrent): Secondary | ICD-10-CM

## 2021-09-04 DIAGNOSIS — I2510 Atherosclerotic heart disease of native coronary artery without angina pectoris: Secondary | ICD-10-CM

## 2021-09-04 DIAGNOSIS — E46 Unspecified protein-calorie malnutrition: Secondary | ICD-10-CM | POA: Diagnosis not present

## 2021-09-04 DIAGNOSIS — I739 Peripheral vascular disease, unspecified: Secondary | ICD-10-CM

## 2021-09-04 DIAGNOSIS — K760 Fatty (change of) liver, not elsewhere classified: Secondary | ICD-10-CM

## 2021-09-04 DIAGNOSIS — N179 Acute kidney failure, unspecified: Secondary | ICD-10-CM

## 2021-09-04 DIAGNOSIS — Z8616 Personal history of COVID-19: Secondary | ICD-10-CM

## 2021-09-04 DIAGNOSIS — B965 Pseudomonas (aeruginosa) (mallei) (pseudomallei) as the cause of diseases classified elsewhere: Secondary | ICD-10-CM | POA: Diagnosis not present

## 2021-09-04 DIAGNOSIS — I255 Ischemic cardiomyopathy: Secondary | ICD-10-CM

## 2021-09-04 DIAGNOSIS — F17210 Nicotine dependence, cigarettes, uncomplicated: Secondary | ICD-10-CM

## 2021-09-04 DIAGNOSIS — M1990 Unspecified osteoarthritis, unspecified site: Secondary | ICD-10-CM | POA: Diagnosis not present

## 2021-09-04 DIAGNOSIS — D509 Iron deficiency anemia, unspecified: Secondary | ICD-10-CM | POA: Diagnosis not present

## 2021-09-04 DIAGNOSIS — R7881 Bacteremia: Secondary | ICD-10-CM | POA: Diagnosis not present

## 2021-09-04 DIAGNOSIS — I5042 Chronic combined systolic (congestive) and diastolic (congestive) heart failure: Secondary | ICD-10-CM

## 2021-09-04 DIAGNOSIS — F32A Depression, unspecified: Secondary | ICD-10-CM

## 2021-09-04 DIAGNOSIS — Z4781 Encounter for orthopedic aftercare following surgical amputation: Secondary | ICD-10-CM | POA: Diagnosis not present

## 2021-09-04 DIAGNOSIS — L309 Dermatitis, unspecified: Secondary | ICD-10-CM | POA: Diagnosis not present

## 2021-09-04 DIAGNOSIS — N39498 Other specified urinary incontinence: Secondary | ICD-10-CM

## 2021-09-04 DIAGNOSIS — D62 Acute posthemorrhagic anemia: Secondary | ICD-10-CM | POA: Diagnosis not present

## 2021-09-04 DIAGNOSIS — N401 Enlarged prostate with lower urinary tract symptoms: Secondary | ICD-10-CM

## 2021-09-04 DIAGNOSIS — I110 Hypertensive heart disease with heart failure: Secondary | ICD-10-CM

## 2021-09-04 DIAGNOSIS — G9341 Metabolic encephalopathy: Secondary | ICD-10-CM | POA: Diagnosis not present

## 2021-09-04 DIAGNOSIS — G609 Hereditary and idiopathic neuropathy, unspecified: Secondary | ICD-10-CM

## 2021-09-04 DIAGNOSIS — N529 Male erectile dysfunction, unspecified: Secondary | ICD-10-CM

## 2021-09-04 DIAGNOSIS — R338 Other retention of urine: Secondary | ICD-10-CM

## 2021-09-04 NOTE — Telephone Encounter (Signed)
.  Requesting: clonazepam 0.25mg  disintegrating tab Contract: None UDS: None Last Visit: 08/13/2021 Next Visit: 11/18/2021 Last Refill: 07/15/21 #60 and 0RF  Please Advise

## 2021-09-04 NOTE — Telephone Encounter (Signed)
Plan of care signed and faxed to [facility] [phone]). Form sent for scanning.

## 2021-09-04 NOTE — Telephone Encounter (Signed)
PDMP okay, Rx sent 

## 2021-09-05 DIAGNOSIS — R7881 Bacteremia: Secondary | ICD-10-CM | POA: Diagnosis not present

## 2021-09-05 DIAGNOSIS — E46 Unspecified protein-calorie malnutrition: Secondary | ICD-10-CM | POA: Diagnosis not present

## 2021-09-05 DIAGNOSIS — R338 Other retention of urine: Secondary | ICD-10-CM | POA: Diagnosis not present

## 2021-09-05 DIAGNOSIS — L309 Dermatitis, unspecified: Secondary | ICD-10-CM | POA: Diagnosis not present

## 2021-09-05 DIAGNOSIS — E7800 Pure hypercholesterolemia, unspecified: Secondary | ICD-10-CM | POA: Diagnosis not present

## 2021-09-05 DIAGNOSIS — Z4781 Encounter for orthopedic aftercare following surgical amputation: Secondary | ICD-10-CM | POA: Diagnosis not present

## 2021-09-05 DIAGNOSIS — I255 Ischemic cardiomyopathy: Secondary | ICD-10-CM | POA: Diagnosis not present

## 2021-09-05 DIAGNOSIS — B965 Pseudomonas (aeruginosa) (mallei) (pseudomallei) as the cause of diseases classified elsewhere: Secondary | ICD-10-CM | POA: Diagnosis not present

## 2021-09-05 DIAGNOSIS — K760 Fatty (change of) liver, not elsewhere classified: Secondary | ICD-10-CM | POA: Diagnosis not present

## 2021-09-05 DIAGNOSIS — N179 Acute kidney failure, unspecified: Secondary | ICD-10-CM | POA: Diagnosis not present

## 2021-09-05 DIAGNOSIS — I5042 Chronic combined systolic (congestive) and diastolic (congestive) heart failure: Secondary | ICD-10-CM | POA: Diagnosis not present

## 2021-09-05 DIAGNOSIS — Z89611 Acquired absence of right leg above knee: Secondary | ICD-10-CM | POA: Diagnosis not present

## 2021-09-05 DIAGNOSIS — G9341 Metabolic encephalopathy: Secondary | ICD-10-CM | POA: Diagnosis not present

## 2021-09-05 DIAGNOSIS — M1990 Unspecified osteoarthritis, unspecified site: Secondary | ICD-10-CM | POA: Diagnosis not present

## 2021-09-05 DIAGNOSIS — D509 Iron deficiency anemia, unspecified: Secondary | ICD-10-CM | POA: Diagnosis not present

## 2021-09-05 DIAGNOSIS — I110 Hypertensive heart disease with heart failure: Secondary | ICD-10-CM | POA: Diagnosis not present

## 2021-09-05 DIAGNOSIS — F17210 Nicotine dependence, cigarettes, uncomplicated: Secondary | ICD-10-CM | POA: Diagnosis not present

## 2021-09-05 DIAGNOSIS — D62 Acute posthemorrhagic anemia: Secondary | ICD-10-CM | POA: Diagnosis not present

## 2021-09-05 DIAGNOSIS — I739 Peripheral vascular disease, unspecified: Secondary | ICD-10-CM | POA: Diagnosis not present

## 2021-09-05 DIAGNOSIS — I2510 Atherosclerotic heart disease of native coronary artery without angina pectoris: Secondary | ICD-10-CM | POA: Diagnosis not present

## 2021-09-10 ENCOUNTER — Telehealth: Payer: Self-pay | Admitting: Internal Medicine

## 2021-09-13 ENCOUNTER — Ambulatory Visit (HOSPITAL_BASED_OUTPATIENT_CLINIC_OR_DEPARTMENT_OTHER): Payer: Medicare Other | Admitting: Family

## 2021-09-13 DIAGNOSIS — I255 Ischemic cardiomyopathy: Secondary | ICD-10-CM | POA: Diagnosis not present

## 2021-09-13 DIAGNOSIS — I2510 Atherosclerotic heart disease of native coronary artery without angina pectoris: Secondary | ICD-10-CM | POA: Diagnosis not present

## 2021-09-13 DIAGNOSIS — E46 Unspecified protein-calorie malnutrition: Secondary | ICD-10-CM | POA: Diagnosis not present

## 2021-09-13 DIAGNOSIS — B965 Pseudomonas (aeruginosa) (mallei) (pseudomallei) as the cause of diseases classified elsewhere: Secondary | ICD-10-CM | POA: Diagnosis not present

## 2021-09-13 DIAGNOSIS — D62 Acute posthemorrhagic anemia: Secondary | ICD-10-CM | POA: Diagnosis not present

## 2021-09-13 DIAGNOSIS — I739 Peripheral vascular disease, unspecified: Secondary | ICD-10-CM | POA: Diagnosis not present

## 2021-09-13 DIAGNOSIS — F17210 Nicotine dependence, cigarettes, uncomplicated: Secondary | ICD-10-CM | POA: Diagnosis not present

## 2021-09-13 DIAGNOSIS — N179 Acute kidney failure, unspecified: Secondary | ICD-10-CM | POA: Diagnosis not present

## 2021-09-13 DIAGNOSIS — K760 Fatty (change of) liver, not elsewhere classified: Secondary | ICD-10-CM | POA: Diagnosis not present

## 2021-09-13 DIAGNOSIS — E7800 Pure hypercholesterolemia, unspecified: Secondary | ICD-10-CM | POA: Diagnosis not present

## 2021-09-13 DIAGNOSIS — M1990 Unspecified osteoarthritis, unspecified site: Secondary | ICD-10-CM | POA: Diagnosis not present

## 2021-09-13 DIAGNOSIS — R7881 Bacteremia: Secondary | ICD-10-CM | POA: Diagnosis not present

## 2021-09-13 DIAGNOSIS — L309 Dermatitis, unspecified: Secondary | ICD-10-CM | POA: Diagnosis not present

## 2021-09-13 DIAGNOSIS — I5042 Chronic combined systolic (congestive) and diastolic (congestive) heart failure: Secondary | ICD-10-CM | POA: Diagnosis not present

## 2021-09-13 DIAGNOSIS — R338 Other retention of urine: Secondary | ICD-10-CM | POA: Diagnosis not present

## 2021-09-13 DIAGNOSIS — Z4781 Encounter for orthopedic aftercare following surgical amputation: Secondary | ICD-10-CM | POA: Diagnosis not present

## 2021-09-13 DIAGNOSIS — G9341 Metabolic encephalopathy: Secondary | ICD-10-CM | POA: Diagnosis not present

## 2021-09-13 DIAGNOSIS — I110 Hypertensive heart disease with heart failure: Secondary | ICD-10-CM | POA: Diagnosis not present

## 2021-09-13 DIAGNOSIS — Z89611 Acquired absence of right leg above knee: Secondary | ICD-10-CM | POA: Diagnosis not present

## 2021-09-13 DIAGNOSIS — D509 Iron deficiency anemia, unspecified: Secondary | ICD-10-CM | POA: Diagnosis not present

## 2021-09-23 ENCOUNTER — Other Ambulatory Visit: Payer: Self-pay | Admitting: Internal Medicine

## 2021-09-23 DIAGNOSIS — M25473 Effusion, unspecified ankle: Secondary | ICD-10-CM

## 2021-09-24 ENCOUNTER — Ambulatory Visit: Payer: Medicare Other | Admitting: Internal Medicine

## 2021-10-04 ENCOUNTER — Telehealth: Payer: Self-pay

## 2021-10-04 NOTE — Telephone Encounter (Signed)
Agree, thank you

## 2021-10-04 NOTE — Telephone Encounter (Signed)
Spoke w/ [name] Anne Hahn- she called to inform that Pt is having trouble urinating, has urge to go but unable to, he is having to strain to be able to urinate. Informed [name] to take Pt to ER for further evaluation of urinary retention. [name] verbalized understanding.

## 2021-10-06 DIAGNOSIS — Z89611 Acquired absence of right leg above knee: Secondary | ICD-10-CM | POA: Diagnosis not present

## 2021-10-06 DIAGNOSIS — M6281 Muscle weakness (generalized): Secondary | ICD-10-CM | POA: Diagnosis not present

## 2021-10-06 DIAGNOSIS — N179 Acute kidney failure, unspecified: Secondary | ICD-10-CM | POA: Diagnosis not present

## 2021-10-09 ENCOUNTER — Telehealth: Payer: Self-pay

## 2021-10-09 NOTE — Telephone Encounter (Signed)
LMOM for Gracee w/ verbal orders.  

## 2021-10-09 NOTE — Telephone Encounter (Signed)
Caller/Agency: [facility]e w/[facility] Callback Number: [phone] Requesting OT/PT/Skilled Nursing/Social Work/Speech Therapy: PT Frequency: VO for 1x a week for 1 wk for PT re-certification

## 2021-10-10 ENCOUNTER — Ambulatory Visit (HOSPITAL_BASED_OUTPATIENT_CLINIC_OR_DEPARTMENT_OTHER)
Admission: RE | Admit: 2021-10-10 | Discharge: 2021-10-10 | Disposition: A | Discharge status: Home / Self Care | Payer: Medicare Other | Source: Ambulatory Visit | Attending: Internal Medicine | Admitting: Internal Medicine

## 2021-10-10 ENCOUNTER — Ambulatory Visit (INDEPENDENT_AMBULATORY_CARE_PROVIDER_SITE_OTHER): Payer: Medicare Other | Admitting: Internal Medicine

## 2021-10-10 ENCOUNTER — Encounter: Payer: Self-pay | Admitting: Internal Medicine

## 2021-10-10 ENCOUNTER — Other Ambulatory Visit: Payer: Self-pay

## 2021-10-10 VITALS — BP 136/68 | HR 51 | Temp 98.2°F | Resp 12

## 2021-10-10 DIAGNOSIS — F0280 Dementia in other diseases classified elsewhere without behavioral disturbance: Secondary | ICD-10-CM | POA: Diagnosis not present

## 2021-10-10 DIAGNOSIS — Z23 Encounter for immunization: Secondary | ICD-10-CM | POA: Diagnosis not present

## 2021-10-10 DIAGNOSIS — S069X0S Unspecified intracranial injury without loss of consciousness, sequela: Secondary | ICD-10-CM

## 2021-10-10 DIAGNOSIS — G3109 Other frontotemporal dementia: Secondary | ICD-10-CM | POA: Diagnosis not present

## 2021-10-10 DIAGNOSIS — R339 Retention of urine, unspecified: Secondary | ICD-10-CM | POA: Diagnosis not present

## 2021-10-10 LAB — URINALYSIS, ROUTINE W REFLEX MICROSCOPIC
Bilirubin Urine: NEGATIVE
Hgb urine dipstick: NEGATIVE
Ketones, ur: NEGATIVE
Leukocytes,Ua: NEGATIVE
Nitrite: NEGATIVE
RBC / HPF: NONE SEEN (ref 0–?)
Specific Gravity, Urine: >=1.03 — AB (ref 1.000–1.030)
Total Protein, Urine: NEGATIVE
Urine Glucose: NEGATIVE
Urobilinogen, UA: 0.2 (ref 0.0–1.0)
pH: 5.5 (ref 5.0–8.0)

## 2021-10-10 LAB — BASIC METABOLIC PANEL
BUN: 16 mg/dL (ref 6–23)
CO2: 26 mEq/L (ref 19–32)
Calcium: 9.7 mg/dL (ref 8.4–10.5)
Chloride: 104 mEq/L (ref 96–112)
Creatinine, Ser: 1.09 mg/dL (ref 0.40–1.50)
GFR: 69.82 mL/min (ref >60.00)
Glucose, Bld: 104 mg/dL — ABNORMAL HIGH (ref 70–99)
Potassium: 4.2 mEq/L (ref 3.5–5.1)
Sodium: 139 mEq/L (ref 135–145)

## 2021-10-10 IMAGING — US US RENAL
1 series · 14 of 25 positions shown · non-contrast
Comparison: [DATE]

CLINICAL DATA: Urinary retention

EXAM:
RENAL / URINARY TRACT ULTRASOUND COMPLETE

[Series 1: us renal · 14 of 53 slices shown]
[im 1/53]
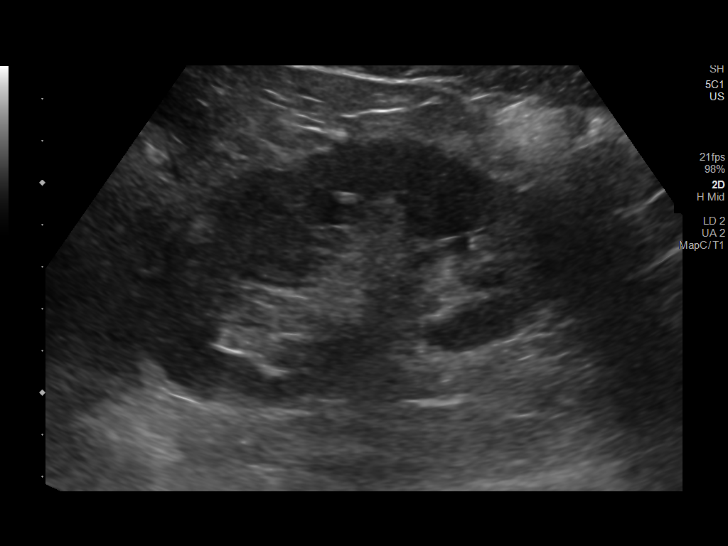
[im 5/53]
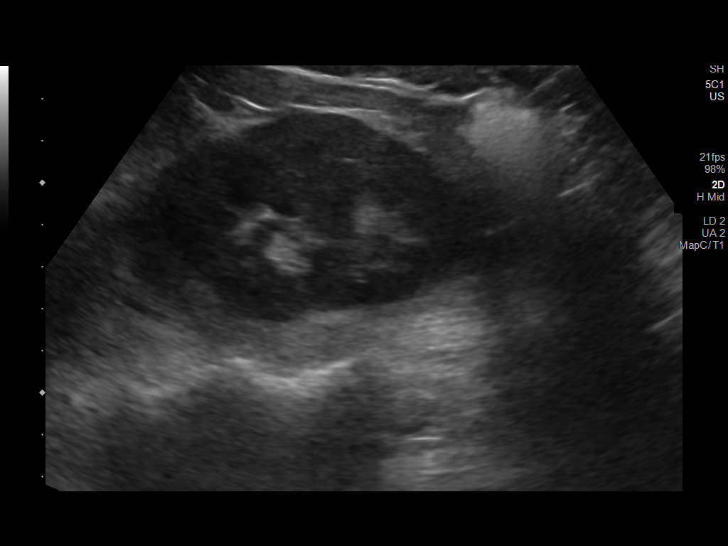
[im 9/53]
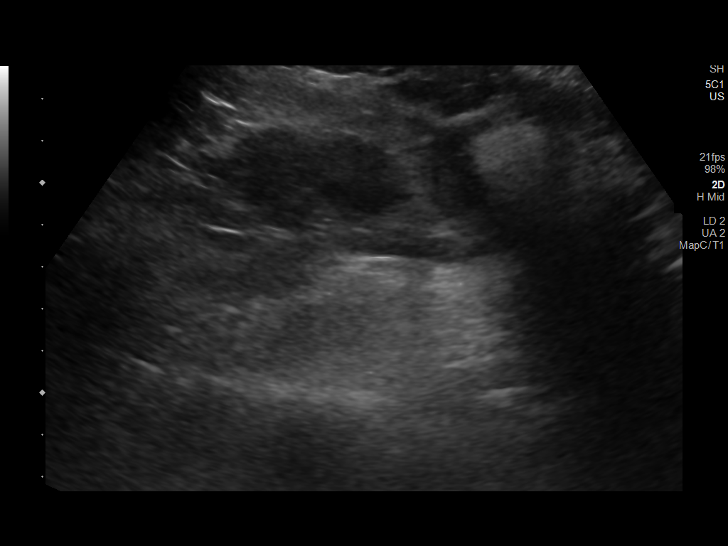
[im 14/53]
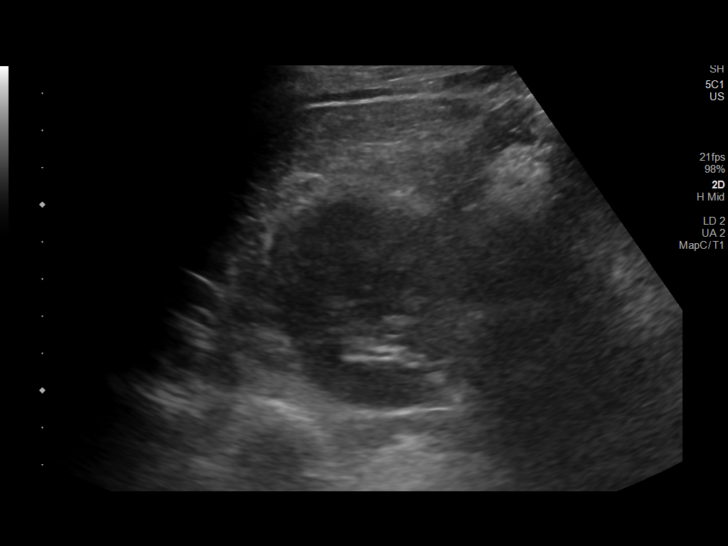
[im 18/53]
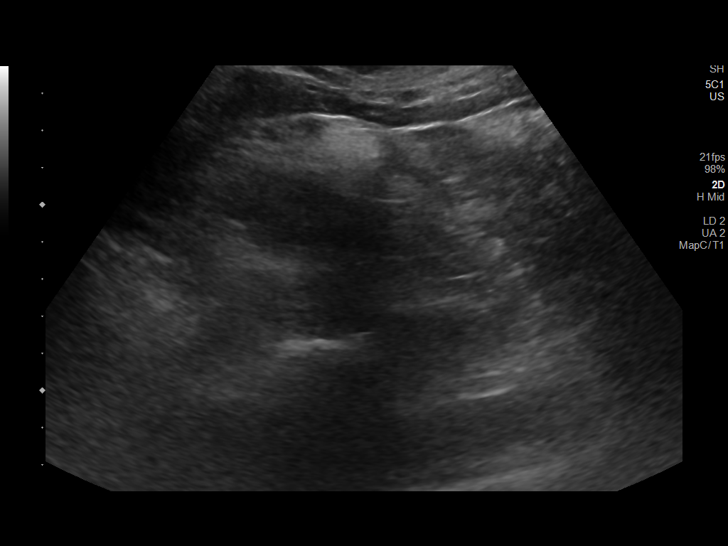
[im 20/53]
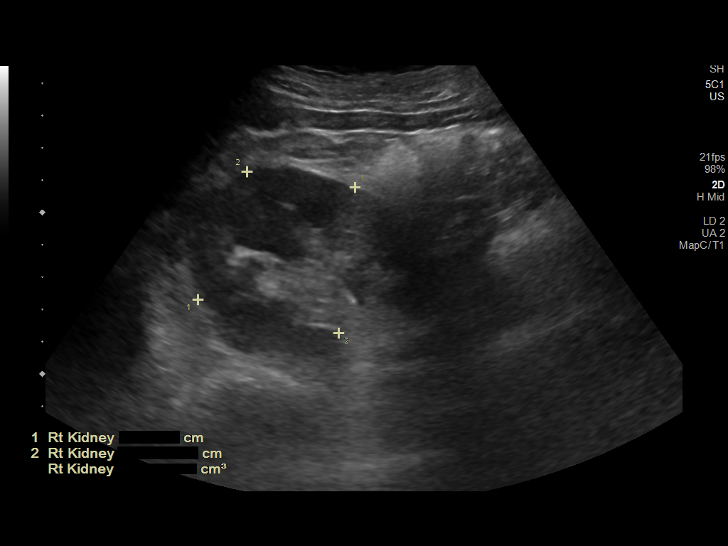
[im 24/53]
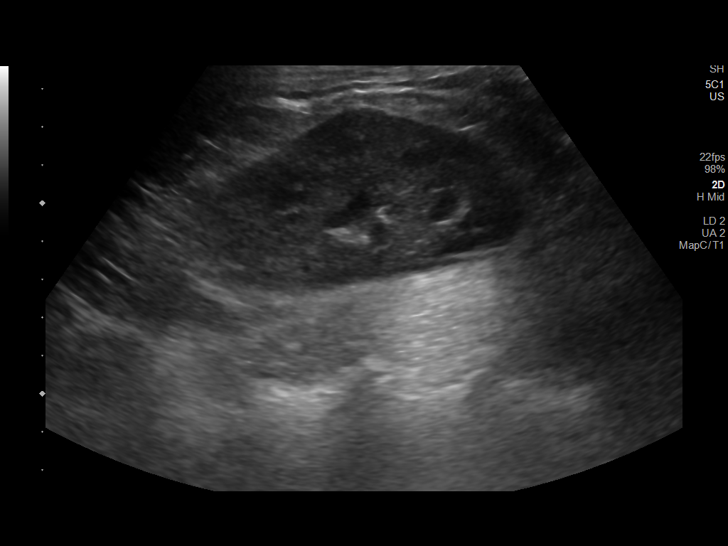
[im 29/53]
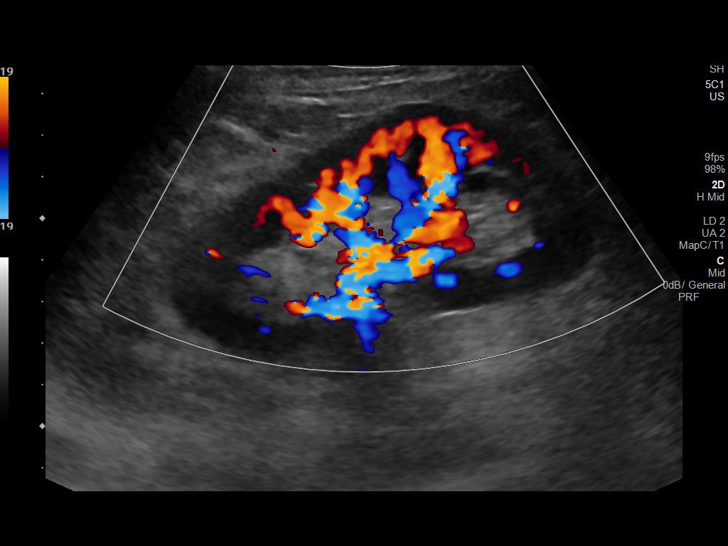
[im 33/53]
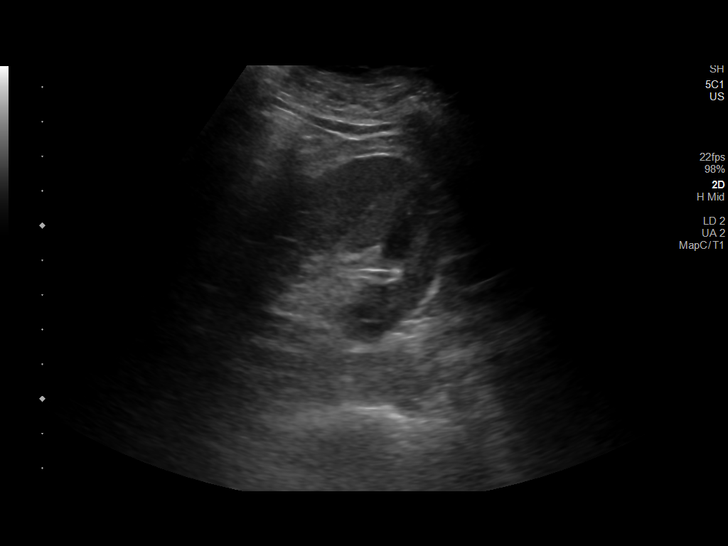
[im 35/53]
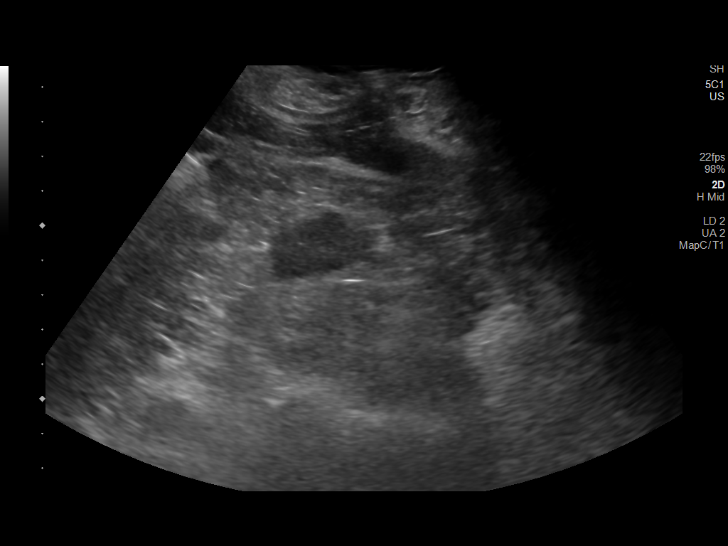
[im 40/53]
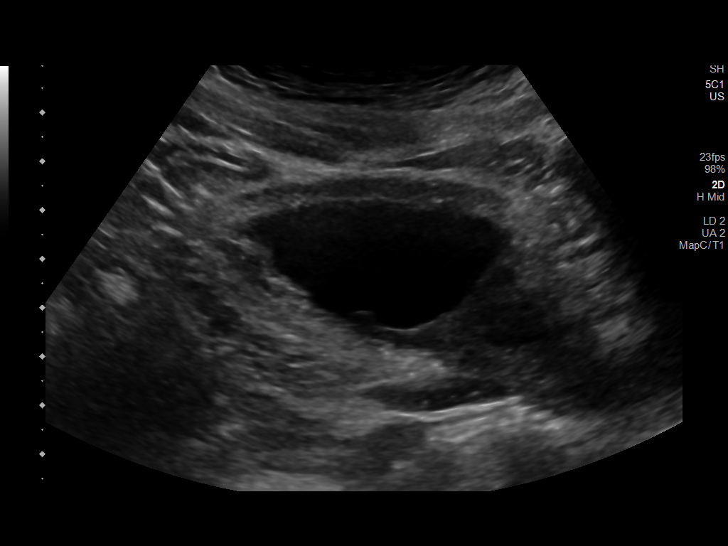
[im 44/53]
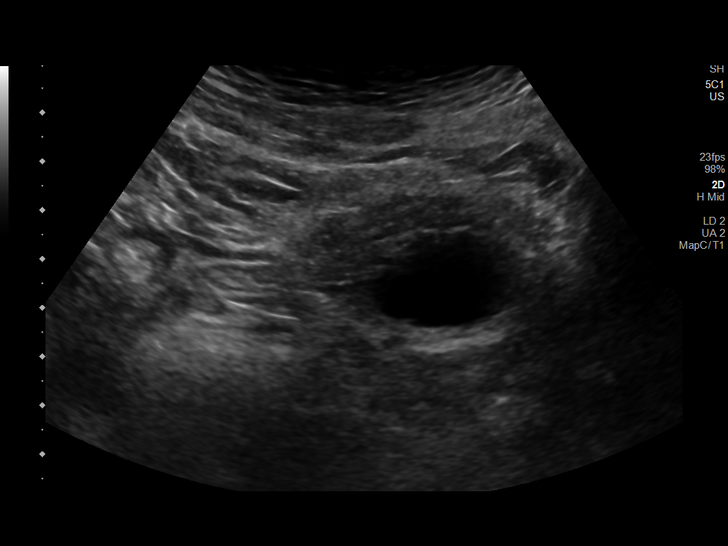
[im 48/53]
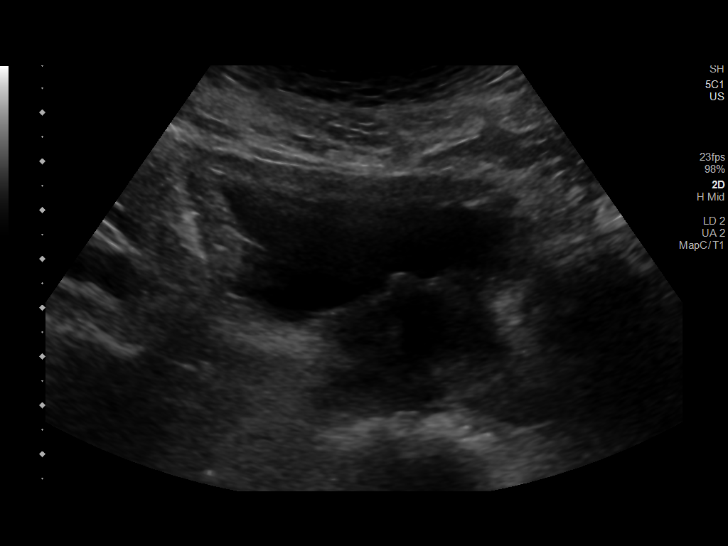
[im 53/53]
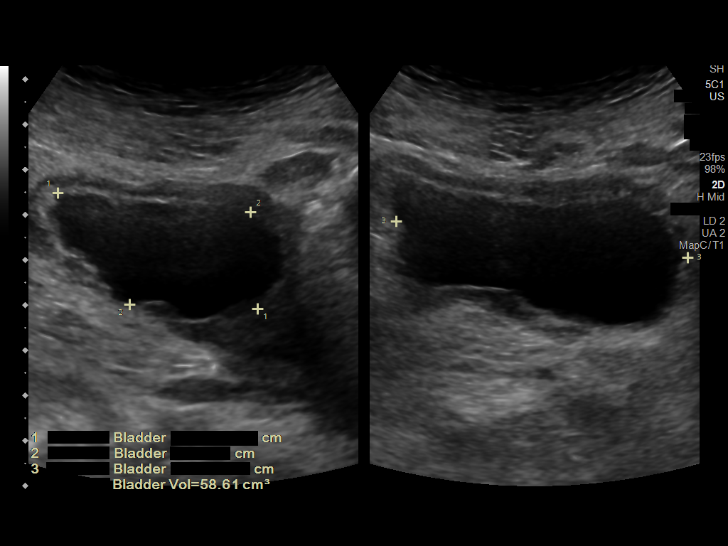

[14 of 25 positions shown; findings below may reference images not displayed]

FINDINGS: Right Kidney:

Renal measurements: 10.6 x 6.0 x 5.8 cm = volume: 192 mL.
Echogenicity within normal limits. No mass or hydronephrosis
visualized.

Left Kidney:

Renal measurements: 10.1 x 4.5 x 4.7 cm = volume: 112 mL.
Echogenicity within normal limits. No mass or hydronephrosis
visualized.

Bladder:

Appears normal for degree of bladder distention. Patient unable to
void.

Other:

None.
IMPRESSION: No acute findings.  No hydronephrosis.

## 2021-10-10 NOTE — Progress Notes (Signed)
Subjective:    Patient ID: Lonnie [name], male    DOB: [birth year], 70 y.o.   MRN: [id number]  DOS:  10/10/2021 Type of visit - description: Acute, here with his wife, his daughter [name] also participated through the phone  Few days ago, the wife reported the patient has difficulty urinating, my advice was to the ER for possible urinary retention.  They declined .  Again symptoms a started a week ago, having to urinate very frequently and only voiding a small amount of urine. The urine at times looks turbid but not bloody. No fever chills No nausea or vomiting No abdominal pain No constipation Most information is provided by the wife, the patient has dementia.  We also talk about caregiver fatigue.  Review of Systems See above   Past Medical History:  Diagnosis Date   Allergy    Anxiety    Arthritis    left neck, shoulder, knee   CAD (coronary artery disease)    a. anterior STEMI 10/2013 s/p 4V CABG with LIMA to mid LAD, SVG to OM, SVG to PDA, SVG to Diagonal.   Chronic combined systolic and diastolic CHF (congestive heart failure) (HCC)    Dementia (HCC)    Depression    Falls    Hypertension    Ischemic cardiomyopathy    a. EF 40-45% at time of CABG and in 2018.   MI (myocardial infarction) (HCC)    Peripheral neuropathy    Prediabetes 09/01/2014   PVD (peripheral vascular disease) (HCC)    a. s/p L SFA stents with now known bilateral SFA. b. right femoral to below the knee bypass by Dr. Myra Gianotti in 12/2019, c/b infection 02/2020.   Stroke [facility])    seen on CT Scan   Subdural hematoma    Tobacco abuse    UTI (urinary tract infection)     Past Surgical History:  Procedure Laterality Date   ABDOMINAL AORTAGRAM N/A 06/07/2014   Procedure: ABDOMINAL Ronny Flurry;  Surgeon: Iran Ouch, MD;  Location: [facility] CATH LAB;  Service: Cardiovascular;  Laterality: N/A;   ABDOMINAL AORTAGRAM N/A 03/07/2015   Procedure: ABDOMINAL Ronny Flurry;  Surgeon: Iran Ouch, MD;   Location: [facility];  Service: Cardiovascular;  Laterality: N/A;   ABDOMINAL AORTOGRAM W/LOWER EXTREMITY N/A 01/18/2020   Procedure: ABDOMINAL AORTOGRAM W/LOWER EXTREMITY - Right;  Surgeon: Iran Ouch, MD;  Location: [facility] INVASIVE CV LAB;  Service: Cardiovascular;  Laterality: N/A;   AMPUTATION Right 07/02/2021   Procedure: RIGHT ABOVE KNEE AMPUTATION;  Surgeon: Sherren Kerns, MD;  Location: [facility] OR;  Service: Vascular;  Laterality: Right;   ANGIOPLASTY Right 06/08/2021   Procedure: BALLOON ANGIOPLASTY;  Surgeon: Nada Libman, MD;  Location: [facility] OR;  Service: Vascular;  Laterality: Right;   APPENDECTOMY     APPLICATION OF WOUND VAC Right 06/27/2021   Procedure: APPLICATION OF WOUND VAC;  Surgeon: Cephus Shelling, MD;  Location: [facility] OR;  Service: Vascular;  Laterality: Right;   COLONOSCOPY WITH PROPOFOL N/A 10/11/2015   Procedure: COLONOSCOPY WITH PROPOFOL;  Surgeon: Charna Elizabeth, MD;  Location: [facility] ENDOSCOPY;  Service: Endoscopy;  Laterality: N/A;   CORONARY ARTERY BYPASS GRAFT N/A 11/04/2013   Procedure: CORONARY ARTERY BYPASS GRAFTING (CABG) TIMES FOUR  USING LEFT INTERNAL MAMMARY ARTERY AND RIGHT AND LEFT SAPHENOUS LEG VEIN HARVESTED ENDOSCOPICALLY;  Surgeon: Loreli Slot, MD;  Location: [facility] OR;  Service: Open Heart Surgery;  Laterality: N/A;   CRANIOTOMY Left 06/30/2019   Procedure: Frontal CRANIOTOMY  HEMATOMA EVACUATION SUBDURAL;  Surgeon: Lisbeth Renshaw, MD;  Location: [facility] OR;  Service: Neurosurgery;  Laterality: Left;  Frontal CRANIOTOMY HEMATOMA EVACUATION SUBDURAL   FEMORAL-POPLITEAL BYPASS GRAFT Right 01/20/2020   Procedure: BYPASS GRAFT FEMORAL-POPLITEAL ARTERY RIGHT LEG USING GORE PROPATEN GRAFT;  Surgeon: Nada Libman, MD;  Location: [facility] OR;  Service: Vascular;  Laterality: Right;   FINGER SURGERY  2017   injury   I & D EXTREMITY Right 03/02/2020   Procedure: IRRIGATION AND DEBRIDEMENT EXTREMITY right  lower leg  with Antibiotic beads.;  Surgeon: Chuck Hint, MD;  Location: [facility] OR;  Service: Vascular;  Laterality: Right;   I & D EXTREMITY Right 06/27/2021   Procedure: IRRIGATION AND DEBRIDEMENT OF RIGHT LEG;  Surgeon: Cephus Shelling, MD;  Location: [facility] OR;  Service: Vascular;  Laterality: Right;   INSERTION OF ILIAC STENT Right 06/08/2021   Procedure: INSERTION OF RIGHT UPPER LEG STENT;  Surgeon: Nada Libman, MD;  Location: [facility] OR;  Service: Vascular;  Laterality: Right;   KNEE SURGERY     fractured patella   LEFT HEART CATH N/A 10/29/2013   Procedure: LEFT HEART CATH;  Surgeon: Kathleene Hazel, MD;  Location: [facility] CATH LAB;  Service: Cardiovascular;  Laterality: N/A;   LEFT HEART CATHETERIZATION WITH CORONARY ANGIOGRAM N/A 10/31/2013   Procedure: LEFT HEART CATHETERIZATION WITH CORONARY ANGIOGRAM;  Surgeon: Kathleene Hazel, MD;  Location: [facility] CATH LAB;  Service: Cardiovascular;  Laterality: N/A;   LIGATION OF CILIAC ARTERY Right 06/27/2021   Procedure: LIGATION OF POPITEAL ARTERY;  Surgeon: Cephus Shelling, MD;  Location: [facility] OR;  Service: Vascular;  Laterality: Right;   LOWER EXTREMITY ANGIOGRAPHY Right 06/07/2021   Procedure: Lower Extremity Angiography;  Surgeon: Sherren Kerns, MD;  Location: [facility] INVASIVE CV LAB;  Service: Cardiovascular;  Laterality: Right;  W/ ABD    MOUTH SURGERY     PATCH ANGIOPLASTY Right 07/08/2021   Procedure: PATCH ANGIOPLASTY WITH 1X6 Kathleen Lime;  Surgeon: Sherren Kerns, MD;  Location: [facility] OR;  Service: Vascular;  Laterality: Right;   PERIPHERAL VASCULAR THROMBECTOMY Right 06/07/2021   Procedure: PERIPHERAL VASCULAR THROMBECTOMY;  Surgeon: Sherren Kerns, MD;  [facility] INV[facility] CV LAB;  Service: Cardiovascular;  Laterality: Right;   REMOVAL OF GRAFT Right 07/08/2021   Procedure: REMOVAL RIGHT FEMORAL BYPASS GRAFT;  Surgeon: Sherren Kerns, MD;  Location: [facility] OR;  Service: Vascular;  Laterality: Right;   TOE SURGERY     VEIN REPAIR Right 06/27/2021   Procedure: REPAIR OF  POPITEAL VEIN;  Surgeon: Cephus Shelling, MD;  Location: [facility] OR;  Service: Vascular;  Laterality: Right;    Allergies as of 10/10/2021       Reactions   Ace Inhibitors Swelling, Other (See Comments)   Angioedema   Eggs Or Egg-derived Products Nausea Only, Other (See Comments)   Cannot eat prepared eggs- Occasional nausea   Lactose Intolerance (gi) Diarrhea, Nausea Only, Other (See Comments)   Flatulence, also   Latex Itching        Medication List        Accurate as of October 10, 2021  2:58 PM. If you have any questions, ask your nurse or doctor.          acetaminophen 500 MG tablet Commonly known as: TYLENOL Take 1,000 mg by mouth in the morning and at bedtime.   aspirin EC 81 MG tablet Take 81 mg by mouth daily.   BEN GAY EX Apply 1 application topically  daily as needed (for back pain).   CALCIUM 600+D3 PO Take 1 tablet by mouth daily with breakfast.   citalopram 20 MG tablet Commonly known as: CELEXA Take 1 tablet (20 mg total) by mouth daily. What changed:  how much to take how to take this when to take this additional instructions   clonazePAM 0.25 MG disintegrating tablet Commonly known as: KLONOPIN DISSOLVE 1 TABLET ON THE  TONGUE TWICE DAILY AS  NEEDED   clopidogrel 75 MG tablet Commonly known as: PLAVIX Take 1 tablet (75 mg total) by mouth daily.   docusate sodium 100 MG capsule Commonly known as: COLACE Take 100 mg by mouth daily.   Ferrous Fumarate 324 (106 Fe) MG Tabs tablet Commonly known as: HEMOCYTE - 106 mg FE Take 1 tablet (106 mg of iron total) by mouth daily.   furosemide 20 MG tablet Commonly known as: LASIX TAKE 1 TABLET BY MOUTH DAILY AS NEEDED FOR ANKLE SWELLING   gabapentin 100 MG capsule Commonly known as: NEURONTIN Take 1 capsule (100 mg total) by mouth 2 (two) times daily.   irbesartan 300 MG tablet Commonly known as: AVAPRO Take 1 tablet (300 mg total) by mouth daily.   melatonin 5 MG Tabs Take 10 mg by  mouth every evening.   metoprolol tartrate 25 MG tablet Commonly known as: LOPRESSOR Take 0.5 tablets (12.5 mg total) by mouth 2 (two) times daily.   multivitamin with minerals Tabs tablet Take 1 tablet by mouth daily.   nicotine 7 mg/24hr patch Commonly known as: NICODERM CQ - dosed in mg/24 hr Place 1 patch (7 mg total) onto the skin daily as needed.   oxyCODONE 5 MG immediate release tablet Commonly known as: Roxicodone Take 1 tablet (5 mg total) by mouth every 6 (six) hours as needed.   pantoprazole 40 MG tablet Commonly known as: PROTONIX Take 1 tablet (40 mg total) by mouth at bedtime.   QUEtiapine 200 MG tablet Commonly known as: SEROQUEL Take 1 tablet (200 mg total) by mouth 2 (two) times daily.   tamsulosin 0.4 MG Caps capsule Commonly known as: FLOMAX Take 2 capsules (0.8 mg total) by mouth daily after supper.           Objective:   Physical Exam BP 136/68 (BP Location: Right Arm, Patient Position: Sitting, Cuff Size: Large)   Pulse (!) 51   Temp 98.2 F (36.8 C) (Oral)   Resp 12   SpO2 99%  General:   Well developed, sitting in a wheelchair.   HEENT:  Normocephalic . Face symmetric, atraumatic Abdomen:  Not distended, soft, non-tender. No rebound or rigidity.   Skin: Not pale. Not jaundice Lower extremities: Status post above-knee leg amputation R. Neurologic:  alert & follow commands, pleasantly demented Speech normal, gait not tested. Psych--  Cognition and judgment appear intact.  Cooperative with normal attention span and concentration.  Behavior appropriate. No anxious or depressed appearing.     Assessment     Assessment Prediabetes  dx 08-2014 HTN Hyperlipidemia Psych Anxiety: per pt, see OV 09/10/2019 Frontotemporal dementia dx 06/2019 but sx started gradually long before  CV: --CAD, MI , cath 10-2013 ---> CABG --Ischemic cardiomyopathy --Peripheral vascular disease Tobacco abuse -- intolerant to chantix before, tried Wellbutrin  2016 (not much help) Onychomycosis-- rx lamisil per podiatry 12-2015 Subdural hematoma, s/p craniotomy: admited 06/2019 Urinary retention, admitted 05/2021   PLAN: Urinary retention?: History of urinary retention, admitted 05/2021, he was released home after he passed voiding trial. Was doing  okay from that perspective until last week, now having some difficulty urinating as described above. Plan: BMP, UA, urine culture, postvoid ultrasound. Will refer to urology, I think this will be a issue going forward. ER if fever, chills, nausea, vomiting, no voiding, abdominal pain. Cont flomax Dementia and caregiver fatigue. Also, the wife is the main caregiver, she is really exhausted, needs some help,  admit pt to a rehab? Admit pt to a rehab from the office is extremely difficult, we agreed to refer to palliative care, hopefully they could provide some respite care and possibly provide a consult to a Child psychotherapist. Egg Free flu shot today     This visit occurred during the SARS-CoV-2 public health emergency.  Safety protocols were in place, including screening questions prior to the visit, additional usage of staff PPE, and extensive cleaning of exam room while observing appropriate contact time as indicated for disinfecting solutions.

## 2021-10-10 NOTE — Assessment & Plan Note (Signed)
Urinary retention?: History of urinary retention, admitted 05/2021, he was released home after he passed voiding trial. Was doing okay from that perspective until last week, now having some difficulty urinating as described above. Plan: BMP, UA, urine culture, postvoid ultrasound. Will refer to urology, I think this will be a issue going forward. ER if fever, chills, nausea, vomiting, no voiding, abdominal pain. Cont flomax Dementia and caregiver fatigue. Also, the wife is the main caregiver, she is really exhausted, needs some help,  admit pt to a rehab? Admit pt to a rehab from the office is extremely difficult, we agreed to refer to palliative care, hopefully they could provide some respite care and possibly provide a consult to a Child psychotherapist. Egg Free flu shot today

## 2021-10-10 NOTE — Patient Instructions (Signed)
Go to the lab  Provide a urine sample   Please go to the first floor to the radiology office, see about the scheduling the ultrasound today or tomorrow  If fever, chills, nausea, vomiting, abdominal pain, inability to void: ER.  We are referring you to urology and  to palliative care.

## 2021-10-11 DIAGNOSIS — L309 Dermatitis, unspecified: Secondary | ICD-10-CM | POA: Diagnosis not present

## 2021-10-11 DIAGNOSIS — F17210 Nicotine dependence, cigarettes, uncomplicated: Secondary | ICD-10-CM | POA: Diagnosis not present

## 2021-10-11 DIAGNOSIS — I5042 Chronic combined systolic (congestive) and diastolic (congestive) heart failure: Secondary | ICD-10-CM | POA: Diagnosis not present

## 2021-10-11 DIAGNOSIS — R338 Other retention of urine: Secondary | ICD-10-CM | POA: Diagnosis not present

## 2021-10-11 DIAGNOSIS — M1990 Unspecified osteoarthritis, unspecified site: Secondary | ICD-10-CM | POA: Diagnosis not present

## 2021-10-11 DIAGNOSIS — R7881 Bacteremia: Secondary | ICD-10-CM | POA: Diagnosis not present

## 2021-10-11 DIAGNOSIS — E7800 Pure hypercholesterolemia, unspecified: Secondary | ICD-10-CM | POA: Diagnosis not present

## 2021-10-11 DIAGNOSIS — G9341 Metabolic encephalopathy: Secondary | ICD-10-CM | POA: Diagnosis not present

## 2021-10-11 DIAGNOSIS — Z89611 Acquired absence of right leg above knee: Secondary | ICD-10-CM | POA: Diagnosis not present

## 2021-10-11 DIAGNOSIS — D509 Iron deficiency anemia, unspecified: Secondary | ICD-10-CM | POA: Diagnosis not present

## 2021-10-11 DIAGNOSIS — B965 Pseudomonas (aeruginosa) (mallei) (pseudomallei) as the cause of diseases classified elsewhere: Secondary | ICD-10-CM | POA: Diagnosis not present

## 2021-10-11 DIAGNOSIS — N179 Acute kidney failure, unspecified: Secondary | ICD-10-CM | POA: Diagnosis not present

## 2021-10-11 DIAGNOSIS — K760 Fatty (change of) liver, not elsewhere classified: Secondary | ICD-10-CM | POA: Diagnosis not present

## 2021-10-11 DIAGNOSIS — I2510 Atherosclerotic heart disease of native coronary artery without angina pectoris: Secondary | ICD-10-CM | POA: Diagnosis not present

## 2021-10-11 DIAGNOSIS — Z4781 Encounter for orthopedic aftercare following surgical amputation: Secondary | ICD-10-CM | POA: Diagnosis not present

## 2021-10-11 DIAGNOSIS — I739 Peripheral vascular disease, unspecified: Secondary | ICD-10-CM | POA: Diagnosis not present

## 2021-10-11 DIAGNOSIS — D62 Acute posthemorrhagic anemia: Secondary | ICD-10-CM | POA: Diagnosis not present

## 2021-10-11 DIAGNOSIS — I255 Ischemic cardiomyopathy: Secondary | ICD-10-CM | POA: Diagnosis not present

## 2021-10-11 DIAGNOSIS — I110 Hypertensive heart disease with heart failure: Secondary | ICD-10-CM | POA: Diagnosis not present

## 2021-10-11 DIAGNOSIS — E46 Unspecified protein-calorie malnutrition: Secondary | ICD-10-CM | POA: Diagnosis not present

## 2021-10-11 LAB — URINE CULTURE
MICRO NUMBER:: 12529159
SPECIMEN QUALITY:: ADEQUATE

## 2021-10-14 ENCOUNTER — Telehealth: Payer: Self-pay | Admitting: Internal Medicine

## 2021-10-14 ENCOUNTER — Telehealth: Payer: Self-pay

## 2021-10-14 NOTE — Telephone Encounter (Signed)
Spoke with patient's daughter [name] and scheduled an in-person Palliative Consult for 10/17/21 @ 1:30 PM with Dr. Bufford Spikes. Documentation will be noted in [facility]'s EMR [employer]  COVID screening was negative. No pets in home. Patient lives with wife.  Consent obtained; updated Outlook/Netsmart/Team List and Epic.   Family is aware they may be receiving a call from provider the day before or day of to confirm appointment.

## 2021-10-14 NOTE — Telephone Encounter (Signed)
Lonnie [name], from [facility] called stating she needed verbal orders for physical therapy. Please advise [phone]

## 2021-10-14 NOTE — Telephone Encounter (Signed)
LMOM for Lonnie [name] w/ verbal orders.

## 2021-10-15 ENCOUNTER — Ambulatory Visit: Payer: Medicare Other | Admitting: Internal Medicine

## 2021-10-16 ENCOUNTER — Telehealth (INDEPENDENT_AMBULATORY_CARE_PROVIDER_SITE_OTHER): Payer: Medicare Other | Admitting: Family

## 2021-10-16 ENCOUNTER — Encounter: Payer: Self-pay | Admitting: Internal Medicine

## 2021-10-16 ENCOUNTER — Encounter (HOSPITAL_BASED_OUTPATIENT_CLINIC_OR_DEPARTMENT_OTHER): Payer: Self-pay | Admitting: Family

## 2021-10-16 ENCOUNTER — Telehealth (HOSPITAL_BASED_OUTPATIENT_CLINIC_OR_DEPARTMENT_OTHER): Payer: Self-pay | Admitting: Family

## 2021-10-16 VITALS — BP 128/68 | HR 48 | Ht 71.0 in | Wt 205.0 lb

## 2021-10-16 DIAGNOSIS — I255 Ischemic cardiomyopathy: Secondary | ICD-10-CM

## 2021-10-16 DIAGNOSIS — I25118 Atherosclerotic heart disease of native coronary artery with other forms of angina pectoris: Secondary | ICD-10-CM

## 2021-10-16 DIAGNOSIS — I5022 Chronic systolic (congestive) heart failure: Secondary | ICD-10-CM | POA: Diagnosis not present

## 2021-10-16 DIAGNOSIS — E785 Hyperlipidemia, unspecified: Secondary | ICD-10-CM | POA: Diagnosis not present

## 2021-10-16 DIAGNOSIS — I10 Essential (primary) hypertension: Secondary | ICD-10-CM | POA: Diagnosis not present

## 2021-10-16 NOTE — Telephone Encounter (Signed)
  Patient Consent for Virtual Visit      RAMIL EDGINGTON has provided verbal consent on 10/16/2021 for a virtual visit (video or telephone).   CONSENT FOR VIRTUAL VISIT FOR:  Rhythm Wigfall Gernert  By participating in this virtual visit I agree to the following:  I hereby voluntarily request, consent and authorize [facility] and its employed or contracted physicians, physician assistants, nurse practitioners or other licensed health care professionals (the Practitioner), to provide me with telemedicine health care services (the "Services") as deemed necessary by the treating Practitioner. I acknowledge and consent to receive the Services by the Practitioner via telemedicine. I understand that the telemedicine visit will involve communicating with the Practitioner through live audiovisual communication technology and the disclosure of certain medical information by electronic transmission. I acknowledge that I have been given the opportunity to request an in-person assessment or other available alternative prior to the telemedicine visit and am voluntarily participating in the telemedicine visit.  I understand that I have the right to withhold or withdraw my consent to the use of telemedicine in the course of my care at any time, without affecting my right to future care or treatment, and that the Practitioner or I may terminate the telemedicine visit at any time. I understand that I have the right to inspect all information obtained and/or recorded in the course of the telemedicine visit and may receive copies of available information for a reasonable fee.  I understand that some of the potential risks of receiving the Services via telemedicine include:  Delay or interruption in medical evaluation due to technological equipment failure or disruption; Information transmitted may not be sufficient (e.g. poor resolution of images) to allow for appropriate medical decision making by the Practitioner;  and/or  In rare instances, security protocols could fail, causing a breach of personal health information.  Furthermore, I acknowledge that it is my responsibility to provide information about my medical history, conditions and care that is complete and accurate to the best of my ability. I acknowledge that Practitioner's advice, recommendations, and/or decision may be based on factors not within their control, such as incomplete or inaccurate data provided by me or distortions of diagnostic images or specimens that may result from electronic transmissions. I understand that the practice of medicine is not an exact science and that Practitioner makes no warranties or guarantees regarding treatment outcomes. I acknowledge that a copy of this consent can be made available to me via my patient portal [facility] MyChart), or I can request a printed copy by calling the office of [facility].    I understand that my insurance will be billed for this visit.   I have read or had this consent read to me. I understand the contents of this consent, which adequately explains the benefits and risks of the Services being provided via telemedicine.  I have been provided ample opportunity to ask questions regarding this consent and the Services and have had my questions answered to my satisfaction. I give my informed consent for the services to be provided through the use of telemedicine in my medical care

## 2021-10-16 NOTE — Progress Notes (Signed)
Virtual Visit via Video Note   This visit type was conducted due to national recommendations for restrictions regarding the COVID-19 Pandemic (e.g. social distancing) in an effort to limit this patient's exposure and mitigate transmission in our community.  Due to his co-morbid illnesses, this patient is at least at moderate risk for complications without adequate follow up.  This format is felt to be most appropriate for this patient at this time.  All issues noted in this document were discussed and addressed.  A limited physical exam was performed with this format.  Please refer to the patient's chart for his consent to telehealth for [name].  Video Connection Lost Video connection was lost at > 50% of the duration of this visit, at which time the remainder of the visit was completed via audio only.      Date:  10/16/2021   ID:  CARDARIUS SENAT, DOB [birth year], MRN [id number] The patient was identified using 2 identifiers.  Patient Location: Home Provider Location: Office/Clinic   PCP:  [name], MD   [facility] Providers Cardiologist:  Verne Carrow, MD     Evaluation Performed:  Follow-Up Visit  Chief Complaint:  Hospital follow up  History of Present Illness:    Lonnie [name] is a 70 y.o. male with CAD s/p CABG x4 2014, subdural hematoma s/p craniotomy (2020 with residual balance deficits and delayed processing), ICM, PAD with right AKA, UTI, COVID 19.  Admitted 06/15/21-07/12/21. Significant PAD history with right femoropopliteal bypass graft, stent x2 with readmission with COVID19, UTI. He ended up with right AKA 07/02/21 due to gangrene of right foot. He had pseudomonas bacteremia related to infected graft and treated with cefepime per ID consult. On 07/08/21 underwent removal of infected RLE bypass graft. Hospitalization coplicated by metabolic encephalopathy (MRI brain unremarkable), elevated liver enzymes, anemia requiring 4u PRBC.   Follow up  today via video visit which was transitioned to phone predominantly with his daughter. Palliative care has been consulted due to poor prognosis, caregiver burden and they are coming to the home Friday. Reports no shortness of breath at rest and stable dyspnea on exertion. Mostly sedentary. Reports no chest pain, pressure, or tightness. No edema, orthopnea, PND. Reports no palpitations.  BP has been well controlled. HR 45-50npm while resting but routinely increases with activity. No lightheadedness, dizziness, near syncope, syncope.   The patient does not have symptoms concerning for COVID-19 infection (fever, chills, cough, or new shortness of breath).    Past Medical History:  Diagnosis Date   Allergy    Anxiety    Arthritis    left neck, shoulder, knee   CAD (coronary artery disease)    a. anterior STEMI 10/2013 s/p 4V CABG with LIMA to mid LAD, SVG to OM, SVG to PDA, SVG to Diagonal.   Chronic combined systolic and diastolic CHF (congestive heart failure) (HCC)    Dementia (HCC)    Depression    Falls    Hypertension    Ischemic cardiomyopathy    a. EF 40-45% at time of CABG and in 2018.   MI (myocardial infarction) (HCC)    Peripheral neuropathy    Prediabetes 09/01/2014   PVD (peripheral vascular disease) (HCC)    a. s/p L SFA stents with now known bilateral SFA. b. right femoral to below the knee bypass by Dr. Myra Gianotti in 12/2019, c/b infection 02/2020.   Stroke [name])    seen on CT Scan   Subdural hematoma  Tobacco abuse    UTI (urinary tract infection)    Past Surgical History:  Procedure Laterality Date   ABDOMINAL AORTAGRAM N/A 06/07/2014   Procedure: ABDOMINAL AORTAGRAM;  Surgeon: Iran Ouch, MD;  Location: [facility] CATH LAB;  Service: Cardiovascular;  Laterality: N/A;   ABDOMINAL AORTAGRAM N/A 03/07/2015   Procedure: ABDOMINAL Ronny Flurry;  Surgeon: Iran Ouch, MD;  Location: [facility] CATH [facility];  Service: Cardiovascular;  Laterality: N/A;   ABDOMINAL AORTOGRAM W/LOWER  EXTREMITY N/A 01/18/2020   Procedure: ABDOMINAL AORTOGRAM W/LOWER EXTREMITY - Right;  Surgeon: Iran Ouch, MD;  [facility]: [facility] INVASIVE CV LAB;  Service: Cardiovascular;  Laterality: N/A;   AMPUTATION Right 07/02/2021   Procedure: RIGHT ABOVE KNEE AMPUTATION;  Surgeon: Sherren Kerns, MD;  Location: [facility] OR;  Service: Vascular;  Laterality: Right;   ANGIOPLASTY Right 06/08/2021   Procedure: BALLOON ANGIOPLASTY;  Surgeon: Nada Libman, MD;  Location: [name] OR;  Service: Vascular;  Laterality: Right;   APPENDECTOMY     APPLICATION OF WOUND VAC Right 06/27/2021   Procedure: APPLICATION OF WOUND VAC;  Surgeon: Cephus Shelling, MD;  Location: [facility] OR;  Service: Vascular;  Laterality: Right;   COLONOSCOPY WITH PROPOFOL N/A 10/11/2015   Procedure: COLONOSCOPY WITH PROPOFOL;  Surgeon: Charna Elizabeth, MD;  Location: [facility] ENDOSCOPY;  Service: Endoscopy;  Laterality: N/A;   CORONARY ARTERY BYPASS GRAFT N/A 11/04/2013   Procedure: CORONARY ARTERY BYPASS GRAFTING (CABG) TIMES FOUR  USING LEFT INTERNAL MAMMARY ARTERY AND RIGHT AND LEFT SAPHENOUS LEG VEIN HARVESTED ENDOSCOPICALLY;  Surgeon: Loreli Slot, MD;  Location: [facility] OR;  Service: Open Heart Surgery;  Laterality: N/A;   CRANIOTOMY Left 06/30/2019   Procedure: Frontal CRANIOTOMY HEMATOMA EVACUATION SUBDURAL;  Surgeon: Lisbeth Renshaw, MD;  Location: [facility] OR;  Service: Neurosurgery;  Laterality: Left;  Frontal CRANIOTOMY HEMATOMA EVACUATION SUBDURAL   FEMORAL-POPLITEAL BYPASS GRAFT Right 01/20/2020   Procedure: BYPASS GRAFT FEMORAL-POPLITEAL ARTERY RIGHT LEG USING GORE PROPATEN GRAFT;  Surgeon: Nada Libman, MD;  Location: [facility] OR;  Service: Vascular;  Laterality: Right;   FINGER SURGERY  2017   injury   I & D EXTREMITY Right 03/02/2020   Procedure: IRRIGATION AND DEBRIDEMENT EXTREMITY right  lower leg  with Antibiotic beads.;  Surgeon: Chuck Hint, MD;  Location: [name] OR;  Service: Vascular;  Laterality: Right;   I & D EXTREMITY Right  06/27/2021   Procedure: IRRIGATION AND DEBRIDEMENT OF RIGHT LEG;  Surgeon: Cephus Shelling, MD;  Location: [facility] OR;  Service: Vascular;  Laterality: Right;   INSERTION OF ILIAC STENT Right 06/08/2021   Procedure: INSERTION OF RIGHT UPPER LEG STENT;  Surgeon: Nada Libman, MD;  [facility]: [facility] OR;  Service: Vascular;  Laterality: Right;   KNEE SURGERY     fractured patella   LEFT HEART CATH N/A 10/29/2013   Procedure: LEFT HEART CATH;  Surgeon: Kathleene Hazel, MD;  Location: [facility] CATH LAB;  Service: Cardiovascular;  Laterality: N/A;   LEFT HEART CATHETERIZATION WITH CORONARY ANGIOGRAM N/A 10/31/2013   Procedure: LEFT HEART CATHETERIZATION WITH CORONARY ANGIOGRAM;  Surgeon: Kathleene Hazel, MD;  Location: [name] CATH LAB;  Service: Cardiovascular;  Laterality: N/A;   LIGATION OF CILIAC ARTERY Right 06/27/2021   Procedure: LIGATION OF POPITEAL ARTERY;  Surgeon: Cephus Shelling, MD;  Location: [facility] OR;  Service: Vascular;  Laterality: Right;   LOWER EXTREMITY ANGIOGRAPHY Right 06/07/2021   Procedure: Lower Extremity Angiography;  Surgeon: Sherren Kerns, MD;  Location: [name] INVASIVE CV LAB;  Service: Cardiovascular;  Laterality: Right;  W/ ABD    MOUTH SURGERY     PATCH ANGIOPLASTY Right 07/08/2021   Procedure: PATCH ANGIOPLASTY WITH 1X6 Kathleen Lime;  Surgeon: Sherren Kerns, MD;  Location: [facility] OR;  Service: Vascular;  Laterality: Right;   PERIPHERAL VASCULAR THROMBECTOMY Right 06/07/2021   Procedure: PERIPHERAL VASCULAR THROMBECTOMY;  Surgeon: Sherren Kerns, MD;  [facility];  Service: Cardiovascular;  Laterality: Right;   REMOVAL OF GRAFT Right 07/08/2021   Procedure: REMOVAL RIGHT FEMORAL BYPASS GRAFT;  Surgeon: Sherren Kerns, MD;  Location: [facility] OR;  Service: Vascular;  Laterality: Right;   TOE SURGERY     VEIN REPAIR Right 06/27/2021   Procedure: REPAIR OF POPITEAL VEIN;  Surgeon: Cephus Shelling, MD;  Location: [facility] OR;  Service: Vascular;  Laterality:  Right;     Current Meds  Medication Sig   acetaminophen (TYLENOL) 500 MG tablet Take 1,000 mg by mouth in the morning and at bedtime.   aspirin EC 81 MG tablet Take 81 mg by mouth daily.   atorvastatin (LIPITOR) 80 MG tablet Take 80 mg by mouth daily.   Calcium Carb-Cholecalciferol (CALCIUM 600+D3 PO) Take 1 tablet by mouth daily with breakfast.   ciprofloxacin (CIPRO) 500 MG tablet Take 500 mg by mouth 2 (two) times daily.   citalopram (CELEXA) 20 MG tablet Take 20 mg by mouth daily. Patient taking 1 tablet in the morning and 1 at bedtime   clonazePAM (KLONOPIN) 0.25 MG disintegrating tablet DISSOLVE 1 TABLET ON THE  TONGUE TWICE DAILY AS  NEEDED (Patient taking differently: daily.)   clopidogrel (PLAVIX) 75 MG tablet Take 1 tablet (75 mg total) by mouth daily.   docusate sodium (COLACE) 100 MG capsule Take 100 mg by mouth daily.   Ferrous Fumarate (HEMOCYTE - 106 MG FE) 324 (106 Fe) MG TABS tablet Take 1 tablet (106 mg of iron total) by mouth daily.   furosemide (LASIX) 20 MG tablet TAKE 1 TABLET BY MOUTH DAILY AS NEEDED FOR ANKLE SWELLING (Patient taking differently: 20 mg daily.)   gabapentin (NEURONTIN) 100 MG capsule Take 1 capsule (100 mg total) by mouth 2 (two) times daily.   irbesartan (AVAPRO) 300 MG tablet Take 1 tablet (300 mg total) by mouth daily.   melatonin 5 MG TABS Take 10 mg by mouth every evening.   metoprolol tartrate (LOPRESSOR) 25 MG tablet Take 0.5 tablets (12.5 mg total) by mouth 2 (two) times daily.   Multiple Vitamin (MULTIVITAMIN WITH MINERALS) TABS tablet Take 1 tablet by mouth daily.   oxyCODONE (ROXICODONE) 5 MG immediate release tablet Take 1 tablet (5 mg total) by mouth every 6 (six) hours as needed.   pantoprazole (PROTONIX) 40 MG tablet Take 1 tablet (40 mg total) by mouth at bedtime.   QUEtiapine (SEROQUEL) 200 MG tablet Take 1 tablet (200 mg total) by mouth 2 (two) times daily.   tamsulosin (FLOMAX) 0.4 MG CAPS capsule Take 2 capsules (0.8 mg total) by  mouth daily after supper.     Allergies:   Ace inhibitors, Eggs or egg-derived products, Lactose intolerance (gi), and Latex   Social History   Tobacco Use   Smoking status: Every Day    Packs/day: 0.25    Years: 30.00    Pack years: 7.50    Types: Cigarettes   Smokeless tobacco: Former    Quit date: 05/2021   Tobacco comments:    < 1/2 ppd  Vaping Use   Vaping Use: Never used  Substance  Use Topics   Alcohol use: No    Alcohol/week: 0.0 standard drinks   Drug use: No     Family Hx: The patient's family history includes AAA (abdominal aortic aneurysm) in his father; Alcohol abuse in his father; Arthritis in his mother; CAD (age of onset: 49) in his father; Cardiomyopathy in his daughter; Diabetes in his father; Heart attack in his father and mother; Heart disease in his mother; Hypertension in his father and mother; Stroke in his mother. There is no history of Colon cancer or Prostate cancer.  ROS:   Please see the history of present illness.     All other systems reviewed and are negative.   Prior CV studies:   The following studies were reviewed today:    Labs/Other Tests and Data Reviewed:    EKG:  No ECG reviewed.  Recent Labs: 06/16/2021: B Natriuretic Peptide 365.5 07/10/2021: Magnesium 2.0 08/13/2021: ALT 19; Hemoglobin 9.6; Platelets 346.0 10/10/2021: BUN 16; Creatinine, Ser 1.09; Potassium 4.2; Sodium 139   Recent Lipid Panel Lab Results  Component Value Date/Time   CHOL 91 05/24/2021 03:49 PM   TRIG 106 05/24/2021 03:49 PM   HDL 28 (L) 05/24/2021 03:49 PM   CHOLHDL 3.3 05/24/2021 03:49 PM   LDLCALC 44 05/24/2021 03:49 PM    Wt Readings from Last 3 Encounters:  10/16/21 205 lb (93 kg)  07/29/21 140 lb (63.5 kg)  07/12/21 142 lb 3.2 oz (64.5 kg)     Risk Assessment/Calculations:          Objective:    Vital Signs:  BP 128/68   Pulse (!) 48   Ht 5\' 11"  (1.803 m)   Wt 205 lb (93 kg)   BMI 28.59 kg/m    VITAL SIGNS:   reviewed  ASSESSMENT & PLAN:    HFrEF - Echo 06/2021 LVEF 50%. No edema, orthopnea, PND. GDMT includes Lasix, Metoprolol, Irbesartan.    CAD / ICM - Stable with no anginal symptoms. No indication for ischemic evaluation.  GDMT includes Metoprolol, Atorvastatin, Aspirin, Clopidogrel.   Bradycardia - Asymptomatic no lightheadedness, dizziness. On low dose Metoprolol. Per daughter report HR increased to 60s-70s with movement or ambulation. If HR persistently <55 bpm will plan to discontinue Metoprolol. Daughter verbalized understanding.   HTN - BP well controlled. Continue current antihypertensive regimen.  Home monitoring encouraged.   HLD - Continue Atorvastatin 80mg  QD.  PAD - Follows with vascular surgery.   Dementia - Upcoming palliative care evaluation.   COVID-19 Education: The signs and symptoms of COVID-19 were discussed with the patient and how to seek care for testing (follow up with PCP or arrange E-visit).  The importance of social distancing was discussed today.  Time:   Today, I have spent 11 minutes with the patient with telehealth technology discussing the above problems.     Medication Adjustments/Labs and Tests Ordered: Current medicines are reviewed at length with the patient today.  Concerns regarding medicines are outlined above.   Tests Ordered: No orders of the defined types were placed in this encounter.   Medication Changes: No orders of the defined types were placed in this encounter.   Follow Up:  In Person in 6 month(s) with Dr. 07/2021 or APP.  Signed, , NP  10/16/2021 3:49 PM    [facility]

## 2021-10-16 NOTE — Telephone Encounter (Signed)
Error

## 2021-10-16 NOTE — Patient Instructions (Signed)
Medication Instructions:  Your Physician recommend you continue on your current medication as directed.    *If you need a refill on your cardiac medications before your next appointment, please call your pharmacy*   Lab Work: None today   Testing/Procedures: None today   Follow-Up: At [facility], you and your health needs are our priority.  As part of our continuing mission to provide you with exceptional heart care, we have created designated Provider Care Teams.  These Care Teams include your primary Cardiologist (physician) and Advanced Practice Providers (APPs -  Physician Assistants and Nurse Practitioners) who all work together to provide you with the care you need, when you need it.  We recommend signing up for the patient portal called "MyChart".  Sign up information is provided on this After Visit Summary.  MyChart is used to connect with patients for Virtual Visits (Telemedicine).  Patients are able to view lab/test results, encounter notes, upcoming appointments, etc.  Non-urgent messages can be sent to your provider as well.   To learn more about what you can do with MyChart, go to [email or web address].    Your next appointment:   6 month(s)  The format for your next appointment:   In Person  Provider:   You may see Verne Carrow, MD or one of the following Advanced Practice Providers on your designated Care Team:   Ronie Spies, PA-C Jacolyn Reedy, PA-C   Other Instructions None today

## 2021-10-17 DIAGNOSIS — Z89611 Acquired absence of right leg above knee: Secondary | ICD-10-CM | POA: Diagnosis not present

## 2021-10-17 DIAGNOSIS — R339 Retention of urine, unspecified: Secondary | ICD-10-CM | POA: Diagnosis not present

## 2021-10-17 DIAGNOSIS — I739 Peripheral vascular disease, unspecified: Secondary | ICD-10-CM | POA: Diagnosis not present

## 2021-10-17 DIAGNOSIS — Z515 Encounter for palliative care: Secondary | ICD-10-CM | POA: Diagnosis not present

## 2021-10-17 DIAGNOSIS — I255 Ischemic cardiomyopathy: Secondary | ICD-10-CM | POA: Diagnosis not present

## 2021-10-17 DIAGNOSIS — Z87820 Personal history of traumatic brain injury: Secondary | ICD-10-CM | POA: Diagnosis not present

## 2021-10-21 ENCOUNTER — Other Ambulatory Visit: Payer: Self-pay | Admitting: Internal Medicine

## 2021-10-21 DIAGNOSIS — Z89611 Acquired absence of right leg above knee: Secondary | ICD-10-CM | POA: Diagnosis not present

## 2021-10-22 DIAGNOSIS — F17210 Nicotine dependence, cigarettes, uncomplicated: Secondary | ICD-10-CM | POA: Diagnosis not present

## 2021-10-22 DIAGNOSIS — M1990 Unspecified osteoarthritis, unspecified site: Secondary | ICD-10-CM | POA: Diagnosis not present

## 2021-10-22 DIAGNOSIS — D62 Acute posthemorrhagic anemia: Secondary | ICD-10-CM | POA: Diagnosis not present

## 2021-10-22 DIAGNOSIS — I2510 Atherosclerotic heart disease of native coronary artery without angina pectoris: Secondary | ICD-10-CM | POA: Diagnosis not present

## 2021-10-22 DIAGNOSIS — I255 Ischemic cardiomyopathy: Secondary | ICD-10-CM | POA: Diagnosis not present

## 2021-10-22 DIAGNOSIS — G9341 Metabolic encephalopathy: Secondary | ICD-10-CM | POA: Diagnosis not present

## 2021-10-22 DIAGNOSIS — L309 Dermatitis, unspecified: Secondary | ICD-10-CM | POA: Diagnosis not present

## 2021-10-22 DIAGNOSIS — B965 Pseudomonas (aeruginosa) (mallei) (pseudomallei) as the cause of diseases classified elsewhere: Secondary | ICD-10-CM | POA: Diagnosis not present

## 2021-10-22 DIAGNOSIS — K760 Fatty (change of) liver, not elsewhere classified: Secondary | ICD-10-CM | POA: Diagnosis not present

## 2021-10-22 DIAGNOSIS — I739 Peripheral vascular disease, unspecified: Secondary | ICD-10-CM | POA: Diagnosis not present

## 2021-10-22 DIAGNOSIS — Z89611 Acquired absence of right leg above knee: Secondary | ICD-10-CM | POA: Diagnosis not present

## 2021-10-22 DIAGNOSIS — N179 Acute kidney failure, unspecified: Secondary | ICD-10-CM | POA: Diagnosis not present

## 2021-10-22 DIAGNOSIS — I110 Hypertensive heart disease with heart failure: Secondary | ICD-10-CM | POA: Diagnosis not present

## 2021-10-22 DIAGNOSIS — E7800 Pure hypercholesterolemia, unspecified: Secondary | ICD-10-CM | POA: Diagnosis not present

## 2021-10-22 DIAGNOSIS — R7881 Bacteremia: Secondary | ICD-10-CM | POA: Diagnosis not present

## 2021-10-22 DIAGNOSIS — R338 Other retention of urine: Secondary | ICD-10-CM | POA: Diagnosis not present

## 2021-10-22 DIAGNOSIS — Z4781 Encounter for orthopedic aftercare following surgical amputation: Secondary | ICD-10-CM | POA: Diagnosis not present

## 2021-10-22 DIAGNOSIS — D509 Iron deficiency anemia, unspecified: Secondary | ICD-10-CM | POA: Diagnosis not present

## 2021-10-22 DIAGNOSIS — I5042 Chronic combined systolic (congestive) and diastolic (congestive) heart failure: Secondary | ICD-10-CM | POA: Diagnosis not present

## 2021-10-22 DIAGNOSIS — E46 Unspecified protein-calorie malnutrition: Secondary | ICD-10-CM | POA: Diagnosis not present

## 2021-10-24 ENCOUNTER — Telehealth: Payer: Self-pay | Admitting: Internal Medicine

## 2021-10-24 NOTE — Telephone Encounter (Signed)
Pt. Daughter called and stated that pt is so weak after amputation that his home health aid and surgeon are both suggesting that pt be placed in in-patient rehab facility even if its for 30days. A physical referral needs to be placed instead of verbal.

## 2021-10-24 NOTE — Telephone Encounter (Signed)
If the medications are on his current medication list, okay to refill x6 months

## 2021-10-24 NOTE — Telephone Encounter (Signed)
Spoke with Maxine Glenn, Child psychotherapist with Palliative Care.  Monica states Dr. Renato Gails will complete FL2 form and all necessary tasks/orders to place patient in facility.  Maxine Glenn states they currently need no orders/referrals from PCP and will stay in contact if that changes.

## 2021-10-24 NOTE — Telephone Encounter (Signed)
Please investigate this a little further. I will be happy to provide any paperwork but typically admitted somebody to a rehab facility from the office setting is very very difficult. Perhaps he could benefit from palliative care  or home PT etc.

## 2021-10-24 NOTE — Telephone Encounter (Signed)
Spoke with daughter, [name] [phone]).  Per daughter, Dr. Bufford Spikes (Palliative Care) visited home on 10/18/21 for evaluation and recommended inpatient rehab facility d/t weakness and dementia. Patients physical therapist visited on same day and also recommended inpatient rehab facility.  Social worker, Maxine Glenn [phone]) has spoken to daughter regarding FL2 forms. Maxine Glenn has also contacted [facility] and [facility], both stating they do not have openings currently unless the patient is coming from inpatient hospital. Family is also interested in [facility].  Per daughter, Maxine Glenn is requesting PCP place referral for rehab facility.   LM with [facility] requesting call back to discuss further.

## 2021-10-24 NOTE — Telephone Encounter (Signed)
Thank you :)

## 2021-10-28 ENCOUNTER — Other Ambulatory Visit: Payer: Self-pay | Admitting: Internal Medicine

## 2021-11-01 ENCOUNTER — Telehealth: Payer: Self-pay | Admitting: Internal Medicine

## 2021-11-01 NOTE — Telephone Encounter (Signed)
Just an FYI

## 2021-11-01 NOTE — Telephone Encounter (Signed)
[facility]: Rella Larve: [phone]  Family put PT on hold. They are trying to get him into a skilled rehab. Pt missed PT week of 10/31 and 11/07.

## 2021-11-01 NOTE — Telephone Encounter (Signed)
Noted, thank you

## 2021-11-04 ENCOUNTER — Ambulatory Visit: Payer: Medicare Other

## 2021-11-04 ENCOUNTER — Encounter (HOSPITAL_COMMUNITY): Payer: Medicare Other

## 2021-11-06 DIAGNOSIS — N179 Acute kidney failure, unspecified: Secondary | ICD-10-CM | POA: Diagnosis not present

## 2021-11-06 DIAGNOSIS — Z89611 Acquired absence of right leg above knee: Secondary | ICD-10-CM | POA: Diagnosis not present

## 2021-11-06 DIAGNOSIS — M6281 Muscle weakness (generalized): Secondary | ICD-10-CM | POA: Diagnosis not present

## 2021-11-13 ENCOUNTER — Other Ambulatory Visit: Payer: Self-pay | Admitting: Internal Medicine

## 2021-11-18 ENCOUNTER — Ambulatory Visit: Payer: Medicare Other | Admitting: Internal Medicine

## 2021-11-18 NOTE — Telephone Encounter (Signed)
Appt later today.

## 2021-11-19 ENCOUNTER — Telehealth: Payer: Self-pay

## 2021-11-19 DIAGNOSIS — Z515 Encounter for palliative care: Secondary | ICD-10-CM

## 2021-11-19 NOTE — Telephone Encounter (Signed)
(  10:00 am) SW completed a follow-up call to patient's daughter [name] to discuss bed offer made at [facility] for patient. SW has been assisting [place] and her mother with securing a placement for patient. [name] stated that they were not interested in a [facility] placement. She requested that SW pursue [address], [place] or [place]. SW advised that she will pursue these placements and keep her posted.  *[name] verbalized understanding that family may have to pay a deductible due to patient's insurance before patient is placed.

## 2021-11-22 ENCOUNTER — Other Ambulatory Visit: Payer: Self-pay

## 2021-11-22 ENCOUNTER — Ambulatory Visit: Payer: Medicare Other

## 2021-11-22 ENCOUNTER — Ambulatory Visit (INDEPENDENT_AMBULATORY_CARE_PROVIDER_SITE_OTHER): Payer: Medicare Other | Admitting: Physician Assistant

## 2021-11-22 ENCOUNTER — Encounter: Payer: Self-pay | Admitting: Physician Assistant

## 2021-11-22 ENCOUNTER — Ambulatory Visit
Admission: RE | Admit: 2021-11-22 | Discharge: 2021-11-22 | Disposition: A | Discharge status: Home / Self Care | Payer: Medicare Other | Source: Ambulatory Visit | Attending: Physician Assistant | Admitting: Physician Assistant

## 2021-11-22 VITALS — BP 112/67 | HR 47 | Temp 97.9°F | Resp 20 | Ht 71.0 in | Wt 205.0 lb

## 2021-11-22 DIAGNOSIS — Z23 Encounter for immunization: Secondary | ICD-10-CM

## 2021-11-22 DIAGNOSIS — I70245 Atherosclerosis of native arteries of left leg with ulceration of other part of foot: Secondary | ICD-10-CM

## 2021-11-22 DIAGNOSIS — I739 Peripheral vascular disease, unspecified: Secondary | ICD-10-CM

## 2021-11-22 DIAGNOSIS — Z89611 Acquired absence of right leg above knee: Secondary | ICD-10-CM

## 2021-11-22 NOTE — Progress Notes (Signed)
   Covid-19 Vaccination Clinic  Name:  Lonnie [name]    MRN: [id number] DOB: May 07, 1951  11/22/2021  [name] was observed post Covid-19 immunization for 15 minutes without incident. He was provided with Vaccine Information Sheet and instruction to access the V-Safe system.   [name] was instructed to call 911 with any severe reactions post vaccine: Difficulty breathing  Swelling of face and throat  A fast heartbeat  A bad rash all over body  Dizziness and weakness   Immunizations Administered     Name Date Dose VIS Date Route   Moderna Covid-19 vaccine Bivalent Booster 11/22/2021  1:18 PM 0.5 mL 08/03/2021 Intramuscular   Manufacturer: Gala Murdoch   Lot: [id number]   NDC: 09735-329-92

## 2021-11-22 NOTE — Progress Notes (Signed)
Office Note     CC:  follow up Requesting Provider:  [name], MD  HPI: Lonnie [name] is a 70 y.o. (04-07-1951) male who presents for follow up of PAD. He is s/p right AKA on 07/02/21 by Dr. Myra Gianotti. Prior to his amputation he had undergone femoral to below-knee popliteal artery bypass graft with 6 mm PTFE on 01/20/2020 for nonhealing wound.  He developed a infection in his below-knee incision which was treated with debridement and placement of antibiotic beads on 03/02/2020.  He presented again with an occluded right femoral-popliteal bypass graft and underwent initiation of lytic therapy on 06/07/2021.  The following day his graft was opened and additional covered stents were placed.  He was able to be discharged home on June 24.  He represented to the hospital on June 26 with worsening confusion.  He was found to have COVID-pneumonia.  During his hospital stay he developed a larger wound from his popliteal incision.  There had been an old hematoma from his arteriogram and lytic therapy.  The bleeding became significant and he was taken to the operating room on July 7.  There was complete dehiscence of his anastomosis.  Popliteal vein was repaired and the popliteal artery was ligated.  Unfortunately his leg was no longer salvageable so on 07/02/2021 he had an above-knee amputation.  Later on 07/08/2021 he went back to the operating room for removal of the residual femoral-popliteal bypass graft with vein patch angioplasty of the common femoral artery  At his last visit in August with Dr. Myra Gianotti his right AKA was healing well and he was  referred to [facility] for prosthesis. He was not having any left lower extremity claudication, rest pain or tissue loss  Today he presents with his daughter. He has since gotten a prosthesis for his AKA but daughter explains that since he has been home and not getting regular PT he lost a lot of his upper body strength so he rarely uses his prosthesis. He additionally  has developed a recurrent wound on the anterior left leg over past 5 days. He explains that it started out as two separate blisters and then he hit his leg on something and the blisters opened and since has had open wound. His daughter explains that he has gotten similar wounds in this area on and off since he was in the hospital. They usually tend to heel and then he will hit the leg on something and will have a recurrence. His daughter cleaned it initially with peroxide and chlorhexidine and has since been cleaning it with water. Patients wife has been bandaging it with band aid. He denies any pain in left leg or foot. Unable to appreciate if he has any claudication symptoms but is without any rest pain. Has no foot wounds.   The pt is on a statin for cholesterol management.  The pt is on a daily aspirin.   Other AC:  Plavix The pt is on ARB, BB for hypertension.   The pt is not diabetic.   Tobacco hx:  Former, 06/22  Past Medical History:  Diagnosis Date   Allergy    Anxiety    Arthritis    left neck, shoulder, knee   CAD (coronary artery disease)    a. anterior STEMI 10/2013 s/p 4V CABG with LIMA to mid LAD, SVG to OM, SVG to PDA, SVG to Diagonal.   Chronic combined systolic and diastolic CHF (congestive heart failure) (HCC)    Dementia (HCC)  Depression    Falls    Hypertension    Ischemic cardiomyopathy    a. EF 40-45% at time of CABG and in 2018.   MI (myocardial infarction) (HCC)    Peripheral neuropathy    Prediabetes 09/01/2014   PVD (peripheral vascular disease) (HCC)    a. s/p L SFA stents with now known bilateral SFA. b. right femoral to below the knee bypass by Dr. Myra Gianotti in 12/2019, c/b infection 02/2020.   Stroke [facility])    seen on CT Scan   Subdural hematoma    Tobacco abuse    UTI (urinary tract infection)     Past Surgical History:  Procedure Laterality Date   ABDOMINAL AORTAGRAM N/A 06/07/2014   Procedure: ABDOMINAL Ronny Flurry;  Surgeon: Iran Ouch, MD;   Location: [facility] CATH LAB;  Service: Cardiovascular;  Laterality: N/A;   ABDOMINAL AORTAGRAM N/A 03/07/2015   Procedure: ABDOMINAL Ronny Flurry;  Surgeon: Iran Ouch, MD;  Location: [facility] CATH LAB;  Service: Cardiovascular;  Laterality: N/A;   ABDOMINAL AORTOGRAM W/LOWER EXTREMITY N/A 01/18/2020   Procedure: ABDOMINAL AORTOGRAM W/LOWER EXTREMITY - Right;  Surgeon: Iran Ouch, MD;  Location: [facility] INVASIVE CV LAB;  Service: Cardiovascular;  Laterality: N/A;   AMPUTATION Right 07/02/2021   Procedure: RIGHT ABOVE KNEE AMPUTATION;  Surgeon: Sherren Kerns, MD;  Location: [facility] OR;  Service: Vascular;  Laterality: Right;   ANGIOPLASTY Right 06/08/2021   Procedure: BALLOON ANGIOPLASTY;  Surgeon: Nada Libman, MD;  Location: [facility] OR;  Service: Vascular;  Laterality: Right;   APPENDECTOMY     APPLICATION OF WOUND VAC Right 06/27/2021   Procedure: APPLICATION OF WOUND VAC;  Surgeon: Cephus Shelling, MD;  Location: [facility] OR;  Service: Vascular;  Laterality: Right;   COLONOSCOPY WITH PROPOFOL N/A 10/11/2015   Procedure: COLONOSCOPY WITH PROPOFOL;  Surgeon: Charna Elizabeth, MD;  Location: [facility] ENDOSCOPY;  Service: Endoscopy;  Laterality: N/A;   CORONARY ARTERY BYPASS GRAFT N/A 11/04/2013   Procedure: CORONARY ARTERY BYPASS GRAFTING (CABG) TIMES FOUR  USING LEFT INTERNAL MAMMARY ARTERY AND RIGHT AND LEFT SAPHENOUS LEG VEIN HARVESTED ENDOSCOPICALLY;  Surgeon: Loreli Slot, MD;  Location: [facility] OR;  Service: Open Heart Surgery;  Laterality: N/A;   CRANIOTOMY Left 06/30/2019   Procedure: Frontal CRANIOTOMY HEMATOMA EVACUATION SUBDURAL;  Surgeon: Lisbeth Renshaw, MD;  Location: [facility] OR;  Service: Neurosurgery;  Laterality: Left;  Frontal CRANIOTOMY HEMATOMA EVACUATION SUBDURAL   FEMORAL-POPLITEAL BYPASS GRAFT Right 01/20/2020   Procedure: BYPASS GRAFT FEMORAL-POPLITEAL ARTERY RIGHT LEG USING GORE PROPATEN GRAFT;  Surgeon: Nada Libman, MD;  Location: [facility] OR;  Service: Vascular;  Laterality: Right;   FINGER SURGERY   2017   injury   I & D EXTREMITY Right 03/02/2020   Procedure: IRRIGATION AND DEBRIDEMENT EXTREMITY right  lower leg  with Antibiotic beads.;  Surgeon: Chuck Hint, MD;  Location: [facility] OR;  Service: Vascular;  Laterality: Right;   I & D EXTREMITY Right 06/27/2021   Procedure: IRRIGATION AND DEBRIDEMENT OF RIGHT LEG;  Surgeon: Cephus Shelling, MD;  Location: [facility] OR;  Service: Vascular;  Laterality: Right;   INSERTION OF ILIAC STENT Right 06/08/2021   Procedure: INSERTION OF RIGHT UPPER LEG STENT;  Surgeon: Nada Libman, MD;  Location: [facility] OR;  Service: Vascular;  Laterality: Right;   KNEE SURGERY     fractured patella   LEFT HEART CATH N/A 10/29/2013   Procedure: LEFT HEART CATH;  Surgeon: Kathleene Hazel, MD;  Location: [facility] CATH LAB;  Service: Cardiovascular;  Laterality: N/A;   LEFT HEART CATHETERIZATION WITH CORONARY ANGIOGRAM N/A 10/31/2013   Procedure: LEFT HEART CATHETERIZATION WITH CORONARY ANGIOGRAM;  Surgeon: Kathleene Hazel, MD;  Location: [facility] CATH LAB;  Service: Cardiovascular;  Laterality: N/A;   LIGATION OF CILIAC ARTERY Right 06/27/2021   Procedure: LIGATION OF POPITEAL ARTERY;  Surgeon: Cephus Shelling, MD;  Location: [facility] OR;  Service: Vascular;  Laterality: Right;   LOWER EXTREMITY ANGIOGRAPHY Right 06/07/2021   Procedure: Lower Extremity Angiography;  Surgeon: Sherren Kerns, MD;  Location: [facility] INVASIVE CV LAB;  Service: Cardiovascular;  Laterality: Right;  W/ ABD    MOUTH SURGERY     PATCH ANGIOPLASTY Right 07/08/2021   Procedure: PATCH ANGIOPLASTY WITH 1X6 Kathleen Lime;  Surgeon: Sherren Kerns, MD;  Location: [facility] OR;  Service: Vascular;  Laterality: Right;   PERIPHERAL VASCULAR THROMBECTOMY Right 06/07/2021   Procedure: PERIPHERAL VASCULAR THROMBECTOMY;  Surgeon: Sherren Kerns, MD;  [facility]: [facility] INVASIVE CV LAB;  Service: Cardiovascular;  Laterality: Right;   REMOVAL OF GRAFT Right 07/08/2021   Procedure: REMOVAL RIGHT FEMORAL BYPASS GRAFT;   Surgeon: Sherren Kerns, MD;  Location: [facility] OR;  Service: Vascular;  Laterality: Right;   TOE SURGERY     VEIN REPAIR Right 06/27/2021   Procedure: REPAIR OF POPITEAL VEIN;  Surgeon: Cephus Shelling, MD;  Location: [facility] OR;  Service: Vascular;  Laterality: Right;    Social History   Socioeconomic History   Marital status: Married    Spouse name: [name]   Number of children: 1   Years of education: Not on file   Highest education level: Not on file  Occupational History   Occupation: long term disability --Event organiser: [employer]  Tobacco Use   Smoking status: Former    Packs/day: 0.25    Years: 30.00    Pack years: 7.50    Types: Cigarettes    Quit date: 05/2021    Years since quitting: 0.5   Smokeless tobacco: Former    Quit date: 05/2021   Tobacco comments:    < 1/2 ppd  Vaping Use   Vaping Use: Never used  Substance and Sexual Activity   Alcohol use: No    Alcohol/week: 0.0 standard drinks   Drug use: No   Sexual activity: Yes  Other Topics Concern   Not on file  Social History Narrative   Household-- pt and wife   1 daughter    Social Determinants of Health   Financial Resource Strain: Low Risk    Difficulty of Paying Living Expenses: Not hard at all  Food Insecurity: No Food Insecurity   Worried About Programme researcher, broadcasting/film/video in the Last Year: Never true   Barista in the Last Year: Never true  Transportation Needs: No Transportation Needs   Lack of Transportation (Medical): No   Lack of Transportation (Non-Medical): No  Physical Activity: Inactive   Days of Exercise per Week: 0 days   Minutes of Exercise per Session: 0 min  Stress: No Stress Concern Present   Feeling of Stress : Not at all  Social Connections: Moderately Isolated   Frequency of Communication with Friends and Family: More than three times a week   Frequency of Social Gatherings with Friends and Family: More than three times a week   Attends Religious Services: Never    Database administrator or Organizations: No   Attends Banker Meetings: Never   Marital Status: Married  Intimate Partner Violence: Not At Risk   Fear of Current or Ex-Partner: No   Emotionally Abused: No   Physically Abused: No   Sexually Abused: No    Family History  Problem Relation Age of Onset   CAD Father 28   AAA (abdominal aortic aneurysm) Father    Alcohol abuse Father    Hypertension Father    Diabetes Father    Heart attack Father    Stroke Mother    Arthritis Mother    Heart disease Mother    Hypertension Mother    Heart attack Mother    Cardiomyopathy Daughter    Colon cancer Neg Hx    Prostate cancer Neg Hx     Current Outpatient Medications  Medication Sig Dispense Refill   acetaminophen (TYLENOL) 500 MG tablet Take 1,000 mg by mouth in the morning and at bedtime.     aspirin EC 81 MG tablet Take 81 mg by mouth daily.     atorvastatin (LIPITOR) 80 MG tablet TAKE 1 TABLET BY MOUTH  DAILY 90 tablet 3   Calcium Carb-Cholecalciferol (CALCIUM 600+D3 PO) Take 1 tablet by mouth daily with breakfast.     ciprofloxacin (CIPRO) 500 MG tablet Take 500 mg by mouth 2 (two) times daily.     citalopram (CELEXA) 20 MG tablet TAKE ONE-HALF TABLET BY  MOUTH TWICE DAILY 90 tablet 1   clonazePAM (KLONOPIN) 0.25 MG disintegrating tablet DISSOLVE 1 TABLET ON THE  TONGUE TWICE DAILY AS  NEEDED (Patient taking differently: daily.) 180 tablet 0   clopidogrel (PLAVIX) 75 MG tablet TAKE 1 TABLET BY MOUTH  DAILY 90 tablet 1   docusate sodium (COLACE) 100 MG capsule Take 100 mg by mouth daily.     Ferrous Fumarate (HEMOCYTE - 106 MG FE) 324 (106 Fe) MG TABS tablet Take 1 tablet (106 mg of iron total) by mouth daily. 90 tablet 1   furosemide (LASIX) 20 MG tablet TAKE 1 TABLET BY MOUTH DAILY AS NEEDED FOR ANKLE SWELLING (Patient taking differently: 20 mg daily.) 90 tablet 0   gabapentin (NEURONTIN) 100 MG capsule TAKE 1 CAPSULE BY MOUTH  TWICE DAILY 180 capsule 3   irbesartan  (AVAPRO) 300 MG tablet TAKE 1 TABLET BY MOUTH  DAILY 90 tablet 3   Liniments (BEN GAY EX) Apply 1 application topically daily as needed (for back pain).     melatonin 5 MG TABS Take 10 mg by mouth every evening.     metoprolol tartrate (LOPRESSOR) 25 MG tablet Take 0.5 tablets (12.5 mg total) by mouth 2 (two) times daily. 90 tablet 1   Multiple Vitamin (MULTIVITAMIN WITH MINERALS) TABS tablet Take 1 tablet by mouth daily.     oxyCODONE (ROXICODONE) 5 MG immediate release tablet Take 1 tablet (5 mg total) by mouth every 6 (six) hours as needed. 20 tablet 0   pantoprazole (PROTONIX) 40 MG tablet Take 1 tablet (40 mg total) by mouth at bedtime. 90 tablet 3   QUEtiapine (SEROQUEL) 200 MG tablet TAKE 1 TABLET BY MOUTH  TWICE DAILY 180 tablet 3   tamsulosin (FLOMAX) 0.4 MG CAPS capsule TAKE 2 CAPSULES BY MOUTH  DAILY AFTER SUPPER 180 capsule 1   nicotine (NICODERM CQ - DOSED IN MG/24 HR) 7 mg/24hr patch Place 1 patch (7 mg total) onto the skin daily as needed. (Patient not taking: Reported on 11/22/2021) 30 patch 0   No current facility-administered medications for this visit.    Allergies  Allergen Reactions   Ace Inhibitors  Swelling and Other (See Comments)    Angioedema   Eggs Or Egg-Derived Products Nausea Only and Other (See Comments)    Cannot eat prepared eggs- Occasional nausea   Lactose Intolerance (Gi) Diarrhea, Nausea Only and Other (See Comments)    Flatulence, also   Latex Itching   Peanut-Containing Drug Products Nausea And Vomiting    Does not know which nuts, but states nuts make him vomit     REVIEW OF SYSTEMS:   [X]  denotes positive finding, [ ]  denotes negative finding Cardiac  Comments:  Chest pain or chest pressure:    Shortness of breath upon exertion:    Short of breath when lying flat:    Irregular heart rhythm:        Vascular    Pain in calf, thigh, or hip brought on by ambulation:    Pain in feet at night that wakes you up from your sleep:     Blood clot in  your veins:    Leg swelling:         Pulmonary    Oxygen at home:    Productive cough:     Wheezing:         Neurologic    Sudden weakness in arms or legs:     Sudden numbness in arms or legs:     Sudden onset of difficulty speaking or slurred speech:    Temporary loss of vision in one eye:     Problems with dizziness:         Gastrointestinal    Blood in stool:     Vomited blood:         Genitourinary    Burning when urinating:     Blood in urine:        Psychiatric    Major depression:         Hematologic    Bleeding problems:    Problems with blood clotting too easily:        Skin    Rashes or ulcers:        Constitutional    Fever or chills:      PHYSICAL EXAMINATION:  Vitals:   11/22/21 1440  BP: 112/67  Pulse: (!) 47  Resp: 20  Temp: 97.9 F (36.6 C)  SpO2: 96%  Weight: 205 lb (93 kg)  Height: 5\' 11"  (1.803 m)    General:  WDWN in NAD; vital signs documented above Gait: Not observed, in wheel chair HENT: WNL, normocephalic Pulmonary: normal non-labored breathing , without wheezing Cardiac: regular HR, without  Murmurs  Vascular Exam/Pulses:  Right Left  Radial 2+ (normal) 2+ (normal)  Femoral 2+ (normal) 2+ (normal)  Popliteal AKA absent  DP AKA absent  PT AKA absent   Extremities: without ischemic changes, without Gangrene , without cellulitis; with open wound of left anterior leg. Healthy tissue in wound bed. No signs of infection  Appears to be prior scar tissue in area along skin. Skin is very thin and fragile Musculoskeletal: no muscle wasting or atrophy  Neurologic: A&O X 3;  No focal weakness or paresthesias are detected Psychiatric:  The pt has Normal affect.   Non-Invasive Vascular Imaging:   +-------+-----------+-----------+------------+------------+  ABI/TBIToday's ABIToday's TBIPrevious ABIPrevious TBI  +-------+-----------+-----------+------------+------------+  Right  AKA        AKA        AKA         AKA            +-------+-----------+-----------+------------+------------+  Left  0.45       0          0.52        0.13          +-------+-----------+-----------+------------+------------+    ASSESSMENT/PLAN:: 70 y.o. male here for follow up for PAD. He is now s/p right AKA. Despite multiple interventions unfortunately his leg was not salvageable. His right AKA has healed well and he has a prosthesis. He uses this very rarely due to generalized deconditioning. His left leg now has a new wound on the anterior leg. The wound is clean and does not appear infected. He has known severe disease on the LLE with occluded SFA stents and severe tibial disease. His current wound is superficial and hopefully this will heal as it has in the past without any intervention. Will give him close interval follow up. Discussed with patient and his daughter that if this does not heal or becomes worse he will need Arteriogram initially and possible surgical intervention to follow if no endovascular options are possible. - Advised them to wash wound with mild soap and water, pat dry. Can allow it to get some air during the day but may cover with gauze or bandage.  - He and his daughter know to call for earlier follow up if concerned about wound infection -Will have him follow up in 2-3 weeks for wound check   Graceann Congress, PA-C [facility] [phone]  On call MD:   Edilia Bo

## 2021-11-23 ENCOUNTER — Other Ambulatory Visit (HOSPITAL_BASED_OUTPATIENT_CLINIC_OR_DEPARTMENT_OTHER): Payer: Self-pay

## 2021-11-23 MED ORDER — MODERNA COVID-19 BIVAL BOOSTER 50 MCG/0.5ML IM SUSP
INTRAMUSCULAR | 0 refills | Status: DC
  Filled 2021-11-23: qty 0.5, 1d supply, fill #0

## 2021-11-25 ENCOUNTER — Other Ambulatory Visit (HOSPITAL_BASED_OUTPATIENT_CLINIC_OR_DEPARTMENT_OTHER): Payer: Self-pay

## 2021-11-25 ENCOUNTER — Ambulatory Visit: Payer: Medicare Other | Admitting: Internal Medicine

## 2021-11-28 ENCOUNTER — Other Ambulatory Visit: Payer: Medicare Other

## 2021-11-28 ENCOUNTER — Other Ambulatory Visit: Payer: Self-pay

## 2021-11-28 DIAGNOSIS — Z515 Encounter for palliative care: Secondary | ICD-10-CM

## 2021-11-28 NOTE — Progress Notes (Signed)
SOCIAL WORK TELEPHONE CALL  SW contacted patient's daughter-Lonnie [name] to advise her that the only bed offer for SNF placement made was at [facility]. Stephan Minister advised that they are not interested in [place]. Patient was may have to have his leg amputated due to a blockage. Patient to have a MRI to determine if he will have surgery. She stated that since his status has changed, they will wait before seeking LTC placement for patient.

## 2021-12-06 ENCOUNTER — Other Ambulatory Visit: Payer: Self-pay

## 2021-12-06 ENCOUNTER — Ambulatory Visit: Payer: Medicare Other | Admitting: Physician Assistant

## 2021-12-06 ENCOUNTER — Encounter (HOSPITAL_COMMUNITY): Payer: Self-pay | Admitting: Surgery

## 2021-12-06 DIAGNOSIS — Z89611 Acquired absence of right leg above knee: Secondary | ICD-10-CM | POA: Diagnosis not present

## 2021-12-06 DIAGNOSIS — I739 Peripheral vascular disease, unspecified: Secondary | ICD-10-CM

## 2021-12-06 DIAGNOSIS — M6281 Muscle weakness (generalized): Secondary | ICD-10-CM | POA: Diagnosis not present

## 2021-12-06 DIAGNOSIS — N179 Acute kidney failure, unspecified: Secondary | ICD-10-CM | POA: Diagnosis not present

## 2021-12-06 NOTE — Progress Notes (Signed)
Pt and his daughter arrived this afternoon.  There was some miscommunication about their appointment and studies as she thought he had appt today for ultrasound.  He has a wound on his left leg that has been present for some time and not healing.     He was seen on 11/22/2021 and at that time, his ABI on the left was a little lower than the last visit but essentially unchanged.  Daughter would like u/s of pt's LLE to evaluate given non healing wound.   Per his daughter, he has had interventions in the left leg in the past.  Upon review of his chart, it appears this was by Dr. Kirke Corin.  LLE arterial duplex unable to be done today.  Will bring the pt back with LLE arterial duplex next week and see MD to discuss options.  He does not need ABI since these were just done on 11/22/2021.  Pt's daughter is in agreement with this plan.    Doreatha Massed, [facility] 12/06/2021 3:29 PM

## 2021-12-09 ENCOUNTER — Other Ambulatory Visit: Payer: Self-pay

## 2021-12-09 DIAGNOSIS — I739 Peripheral vascular disease, unspecified: Secondary | ICD-10-CM

## 2021-12-11 ENCOUNTER — Other Ambulatory Visit: Payer: Self-pay

## 2021-12-11 ENCOUNTER — Ambulatory Visit (INDEPENDENT_AMBULATORY_CARE_PROVIDER_SITE_OTHER): Payer: Medicare Other | Admitting: Vascular Surgery

## 2021-12-11 ENCOUNTER — Encounter: Payer: Self-pay | Admitting: Vascular Surgery

## 2021-12-11 ENCOUNTER — Ambulatory Visit (HOSPITAL_COMMUNITY)
Admission: RE | Admit: 2021-12-11 | Discharge: 2021-12-11 | Disposition: A | Discharge status: Home / Self Care | Payer: Medicare Other | Source: Ambulatory Visit | Attending: Vascular Surgery | Admitting: Vascular Surgery

## 2021-12-11 VITALS — BP 134/71 | HR 52 | Resp 20 | Ht 71.0 in | Wt 205.0 lb

## 2021-12-11 DIAGNOSIS — I739 Peripheral vascular disease, unspecified: Secondary | ICD-10-CM | POA: Insufficient documentation

## 2021-12-11 NOTE — H&P (View-Only) (Signed)
Patient ID: Lonnie [name], male   DOB: May 10, 1951, 69 y.o.   MRN: [id number]  Reason for Consult: Follow-up   Referred by [name], MD  Subjective:     HPI:  Lonnie [name] is a 70 y.o. male history of previous left lower extremity endovascular right lower extremity bypass that resulted in amputation.  He now has a shin lesion on the left.  It was previously larger but then resolved and now has been present again for several weeks.  He does use his limb to get around in his wheelchair and pivot.  He has no foot ulcerations. He is on aspirin, Plavix and statin.  Past Medical History:  Diagnosis Date   Allergy    Anxiety    Arthritis    left neck, shoulder, knee   CAD (coronary artery disease)    a. anterior STEMI 10/2013 s/p 4V CABG with LIMA to mid LAD, SVG to OM, SVG to PDA, SVG to Diagonal.   Chronic combined systolic and diastolic CHF (congestive heart failure) (HCC)    Dementia (HCC)    Depression    Falls    Hypertension    Ischemic cardiomyopathy    a. EF 40-45% at time of CABG and in 2018.   MI (myocardial infarction) (HCC)    Peripheral neuropathy    Prediabetes 09/01/2014   PVD (peripheral vascular disease) (HCC)    a. s/p L SFA stents with now known bilateral SFA. b. right femoral to below the knee bypass by Dr. Myra Gianotti in 12/2019, c/b infection 02/2020.   Stroke [facility])    seen on CT Scan   Subdural hematoma    Tobacco abuse    UTI (urinary tract infection)    Family History  Problem Relation Age of Onset   CAD Father 58   AAA (abdominal aortic aneurysm) Father    Alcohol abuse Father    Hypertension Father    Diabetes Father    Heart attack Father    Stroke Mother    Arthritis Mother    Heart disease Mother    Hypertension Mother    Heart attack Mother    Cardiomyopathy Daughter    Colon cancer Neg Hx    Prostate cancer Neg Hx    Past Surgical History:  Procedure Laterality Date   ABDOMINAL AORTAGRAM N/A 06/07/2014   Procedure: ABDOMINAL  Ronny Flurry;  Surgeon: Iran Ouch, MD;  Location: [facility] CATH LAB;  Service: Cardiovascular;  Laterality: N/A;   ABDOMINAL AORTAGRAM N/A 03/07/2015   Procedure: ABDOMINAL Ronny Flurry;  Surgeon: Iran Ouch, MD;  Location: [facility] CATH [facility];  Service: Cardiovascular;  Laterality: N/A;   ABDOMINAL AORTOGRAM W/LOWER EXTREMITY N/A 01/18/2020   Procedure: ABDOMINAL AORTOGRAM W/LOWER EXTREMITY - Right;  Surgeon: Iran Ouch, MD;  Location: [facility] INVASIVE CV LAB;  Service: Cardiovascular;  Laterality: N/A;   AMPUTATION Right 07/02/2021   Procedure: RIGHT ABOVE KNEE AMPUTATION;  Surgeon: Sherren Kerns, MD;  Location: [facility] OR;  Service: Vascular;  Laterality: Right;   ANGIOPLASTY Right 06/08/2021   Procedure: BALLOON ANGIOPLASTY;  Surgeon: Nada Libman, MD;  Location: [facility] OR;  Service: Vascular;  Laterality: Right;   APPENDECTOMY     APPLICATION OF WOUND VAC Right 06/27/2021   Procedure: APPLICATION OF WOUND VAC;  Surgeon: Cephus Shelling, MD;  Location: [facility] OR;  Service: Vascular;  Laterality: Right;   COLONOSCOPY WITH PROPOFOL N/A 10/11/2015   Procedure: COLONOSCOPY WITH PROPOFOL;  Surgeon: Charna Elizabeth, MD;  Location: [facility];  Service: Endoscopy;  Laterality: N/A;   CORONARY ARTERY BYPASS GRAFT N/A 11/04/2013   Procedure: CORONARY ARTERY BYPASS GRAFTING (CABG) TIMES FOUR  USING LEFT INTERNAL MAMMARY ARTERY AND RIGHT AND LEFT SAPHENOUS LEG VEIN HARVESTED ENDOSCOPICALLY;  Surgeon: Loreli Slot, MD;  Location: [facility] OR;  Service: Open Heart Surgery;  Laterality: N/A;   CRANIOTOMY Left 06/30/2019   Procedure: Frontal CRANIOTOMY HEMATOMA EVACUATION SUBDURAL;  Surgeon: Lisbeth Renshaw, MD;  Location: [facility] OR;  Service: Neurosurgery;  Laterality: Left;  Frontal CRANIOTOMY HEMATOMA EVACUATION SUBDURAL   FEMORAL-POPLITEAL BYPASS GRAFT Right 01/20/2020   Procedure: BYPASS GRAFT FEMORAL-POPLITEAL ARTERY RIGHT LEG USING GORE PROPATEN GRAFT;  Surgeon: Nada Libman, MD;  Location: [facility] OR;  Service:  Vascular;  Laterality: Right;   FINGER SURGERY  2017   injury   I & D EXTREMITY Right 03/02/2020   Procedure: IRRIGATION AND DEBRIDEMENT EXTREMITY right  lower leg  with Antibiotic beads.;  Surgeon: Chuck Hint, MD;  Location: [facility] OR;  Service: Vascular;  Laterality: Right;   I & D EXTREMITY Right 06/27/2021   Procedure: IRRIGATION AND DEBRIDEMENT OF RIGHT LEG;  Surgeon: Cephus Shelling, MD;  Location: [facility] OR;  Service: Vascular;  Laterality: Right;   INSERTION OF ILIAC STENT Right 06/08/2021   Procedure: INSERTION OF RIGHT UPPER LEG STENT;  Surgeon: Nada Libman, MD;  Location: [facility] OR;  Service: Vascular;  Laterality: Right;   KNEE SURGERY     fractured patella   LEFT HEART CATH N/A 10/29/2013   Procedure: LEFT HEART CATH;  Surgeon: Kathleene Hazel, MD;  Location: [facility] CATH LAB;  Service: Cardiovascular;  Laterality: N/A;   LEFT HEART CATHETERIZATION WITH CORONARY ANGIOGRAM N/A 10/31/2013   Procedure: LEFT HEART CATHETERIZATION WITH CORONARY ANGIOGRAM;  Surgeon: Kathleene Hazel, MD;  Location: [facility] CATH LAB;  Service: Cardiovascular;  Laterality: N/A;   LIGATION OF CILIAC ARTERY Right 06/27/2021   Procedure: LIGATION OF POPITEAL ARTERY;  Surgeon: Cephus Shelling, MD;  Location: [facility] OR;  Service: Vascular;  Laterality: Right;   LOWER EXTREMITY ANGIOGRAPHY Right 06/07/2021   Procedure: Lower Extremity Angiography;  Surgeon: Sherren Kerns, MD;  Location: [facility] INVASIVE CV LAB;  Service: Cardiovascular;  Laterality: Right;  W/ ABD    MOUTH SURGERY     PATCH ANGIOPLASTY Right 07/08/2021   Procedure: PATCH ANGIOPLASTY WITH 1X6 Kathleen Lime;  Surgeon: Sherren Kerns, MD;  Location: [facility] OR;  Service: Vascular;  Laterality: Right;   PERIPHERAL VASCULAR THROMBECTOMY Right 06/07/2021   Procedure: PERIPHERAL VASCULAR THROMBECTOMY;  Surgeon: Sherren Kerns, MD;  [facility]: [facility] INVASIVE CV LAB;  Service: Cardiovascular;  Laterality: Right;   REMOVAL OF GRAFT Right 07/08/2021    Procedure: REMOVAL RIGHT FEMORAL BYPASS GRAFT;  Surgeon: Sherren Kerns, MD;  Location: [facility] OR;  Service: Vascular;  Laterality: Right;   TOE SURGERY     VEIN REPAIR Right 06/27/2021   Procedure: REPAIR OF POPITEAL VEIN;  Surgeon: Cephus Shelling, MD;  Location: [facility] OR;  Service: Vascular;  Laterality: Right;    Short Social History:  Social History   Tobacco Use   Smoking status: Former    Packs/day: 0.25    Years: 30.00    Pack years: 7.50    Types: Cigarettes    Quit date: 05/2021    Years since quitting: 0.5   Smokeless tobacco: Former    Quit date: 05/2021   Tobacco comments:    < 1/2 ppd  Substance Use Topics   Alcohol  use: No    Alcohol/week: 0.0 standard drinks    Allergies  Allergen Reactions   Ace Inhibitors Swelling and Other (See Comments)    Angioedema   Eggs Or Egg-Derived Products Nausea Only and Other (See Comments)    Cannot eat prepared eggs- Occasional nausea   Lactose Intolerance (Gi) Diarrhea, Nausea Only and Other (See Comments)    Flatulence, also   Latex Itching   Peanut-Containing Drug Products Nausea And Vomiting    Does not know which nuts, but states nuts make him vomit    Current Outpatient Medications  Medication Sig Dispense Refill   acetaminophen (TYLENOL) 500 MG tablet Take 1,000 mg by mouth in the morning and at bedtime.     aspirin EC 81 MG tablet Take 81 mg by mouth daily.     atorvastatin (LIPITOR) 80 MG tablet TAKE 1 TABLET BY MOUTH  DAILY 90 tablet 3   Calcium Carb-Cholecalciferol (CALCIUM 600+D3 PO) Take 1 tablet by mouth daily with breakfast.     ciprofloxacin (CIPRO) 500 MG tablet Take 500 mg by mouth 2 (two) times daily.     citalopram (CELEXA) 20 MG tablet TAKE ONE-HALF TABLET BY  MOUTH TWICE DAILY 90 tablet 1   clonazePAM (KLONOPIN) 0.25 MG disintegrating tablet DISSOLVE 1 TABLET ON THE  TONGUE TWICE DAILY AS  NEEDED (Patient taking differently: daily.) 180 tablet 0   clopidogrel (PLAVIX) 75 MG tablet TAKE 1 TABLET BY  MOUTH  DAILY 90 tablet 1   COVID-19 mRNA bivalent vaccine, Moderna, (MODERNA COVID-19 BIVAL BOOSTER) 50 MCG/0.5ML injection Inject into the muscle. 0.5 mL 0   docusate sodium (COLACE) 100 MG capsule Take 100 mg by mouth daily.     Ferrous Fumarate (HEMOCYTE - 106 MG FE) 324 (106 Fe) MG TABS tablet Take 1 tablet (106 mg of iron total) by mouth daily. 90 tablet 1   furosemide (LASIX) 20 MG tablet TAKE 1 TABLET BY MOUTH DAILY AS NEEDED FOR ANKLE SWELLING (Patient taking differently: 20 mg daily.) 90 tablet 0   gabapentin (NEURONTIN) 100 MG capsule TAKE 1 CAPSULE BY MOUTH  TWICE DAILY 180 capsule 3   irbesartan (AVAPRO) 300 MG tablet TAKE 1 TABLET BY MOUTH  DAILY 90 tablet 3   Liniments (BEN GAY EX) Apply 1 application topically daily as needed (for back pain).     melatonin 5 MG TABS Take 10 mg by mouth every evening.     metoprolol tartrate (LOPRESSOR) 25 MG tablet Take 0.5 tablets (12.5 mg total) by mouth 2 (two) times daily. 90 tablet 1   Multiple Vitamin (MULTIVITAMIN WITH MINERALS) TABS tablet Take 1 tablet by mouth daily.     nicotine (NICODERM CQ - DOSED IN MG/24 HR) 7 mg/24hr patch Place 1 patch (7 mg total) onto the skin daily as needed. 30 patch 0   oxyCODONE (ROXICODONE) 5 MG immediate release tablet Take 1 tablet (5 mg total) by mouth every 6 (six) hours as needed. 20 tablet 0   pantoprazole (PROTONIX) 40 MG tablet Take 1 tablet (40 mg total) by mouth at bedtime. 90 tablet 3   QUEtiapine (SEROQUEL) 200 MG tablet TAKE 1 TABLET BY MOUTH  TWICE DAILY 180 tablet 3   tamsulosin (FLOMAX) 0.4 MG CAPS capsule TAKE 2 CAPSULES BY MOUTH  DAILY AFTER SUPPER 180 capsule 1   No current facility-administered medications for this visit.    Review of Systems  Constitutional:  Constitutional negative. HENT: HENT negative.  Eyes: Eyes negative.  Cardiovascular: Cardiovascular negative.  GI:  Gastrointestinal negative.  Musculoskeletal: Musculoskeletal negative.  Skin: Positive for wound.   Neurological: Neurological negative. Hematologic: Hematologic/lymphatic negative.  Psychiatric: Psychiatric negative.       Objective:  Objective   Vitals:   12/11/21 1205  BP: 134/71  Pulse: (!) 52  Resp: 20  SpO2: 97%  Weight: 205 lb (93 kg)  Height: 5\' 11"  (1.803 m)   Body mass index is 28.59 kg/m.  Physical Exam HENT:     Head: Normocephalic.     Nose:     Comments: Wearing a mask Eyes:     Pupils: Pupils are equal, round, and reactive to light.  Cardiovascular:     Pulses:          Femoral pulses are 1+ on the right side and 2+ on the left side. Pulmonary:     Effort: Pulmonary effort is normal.  Abdominal:     General: Abdomen is flat.     Palpations: Abdomen is soft. There is no mass.  Musculoskeletal:     Left lower leg: No edema.     Comments: Right lower extremity amputation well-healed  Neurological:     General: No focal deficit present.     Mental Status: He is alert.  Psychiatric:        Mood and Affect: Mood normal.     Data: ABI Findings:  +--------+------------------+-----+--------+--------+   Right    Rt Pressure (mmHg) Index Waveform Comment    +--------+------------------+-----+--------+--------+   Brachial 120                                          +--------+------------------+-----+--------+--------+   +--------+------------------+-----+-------------------+-------+   Left     Lt Pressure (mmHg) Index Waveform            Comment   +--------+------------------+-----+-------------------+-------+   Brachial 116                                                    +--------+------------------+-----+-------------------+-------+   PTA      54                 0.45  monophasic                    +--------+------------------+-----+-------------------+-------+   DP       43                 0.36  dampened monophasic           +--------+------------------+-----+-------------------+-------+    +-------+-----------+-----------+------------+------------+   ABI/TBI Today's ABI Today's TBI Previous ABI Previous TBI   +-------+-----------+-----------+------------+------------+   Right   AKA         AKA         AKA          AKA            +-------+-----------+-----------+------------+------------+   Left    0.45        0           0.52         0.13           +-------+-----------+-----------+------------+------------+     +-----------+--------+-----+--------+----------+------------------------+   LEFT        PSV cm/s Ratio  Stenosis Waveform   Comments                   +-----------+--------+-----+--------+----------+------------------------+   CFA Prox    173                     monophasic brisk                      +-----------+--------+-----+--------+----------+------------------------+   DFA         210                     monophasic brisk                      +-----------+--------+-----+--------+----------+------------------------+   SFA Prox    74                      monophasic brisk                      +-----------+--------+-----+--------+----------+------------------------+   SFA Mid     29                      monophasic proximal to stent          +-----------+--------+-----+--------+----------+------------------------+   POP Mid                    occluded            ? occlusion / collateral   +-----------+--------+-----+--------+----------+------------------------+   POP Distal  31                      monophasic dampened, collateral       +-----------+--------+-----+--------+----------+------------------------+   ATA Distal  16                      monophasic dampened                   +-----------+--------+-----+--------+----------+------------------------+   PTA Distal  11                      monophasic dampened                   +-----------+--------+-----+--------+----------+------------------------+   PERO Distal 11                      monophasic dampened                    +-----------+--------+-----+--------+----------+------------------------+      Left Stent(s):  +---------------+-------------+---------------+----------+-----------------  ----+   Prox to Stent   9                             monophasic                          +---------------+-------------+---------------+----------+-----------------  ----+   Proximal Stent  62                            monophasic                          +---------------+-------------+---------------+----------+-----------------  ----+   Mid thigh Stent 67 / 334 / 18  50-99% stenosis            collateral  visualized   +---------------+-------------+---------------+----------+-----------------  ----+   Distal Stent    21 /386 thump 50-99% stenosis            mid to dst  thigh,                                                                 collateral  visualized   +---------------+-------------+---------------+----------+-----------------  ----+      Summary:  Left: 50 - 99% stenosis mid and distal thigh stent. Miminal to no flow in  the distal thigh with visualized collateral. Possible occlusion in the  popliteal with collateral feeding on distally.     Assessment/Plan:     69 year old male with wound of his left lower extremity as pictured above with mildly decreased ABI and likely occluded mid SFA where previously he had intervention in the thigh with stenting.  I discussed with the patient and his daughter to repeat angiography from a right common femoral approach.  There is a very strong left common femoral pulse the right common femoral pulses somewhat diminished but should be accessible.  He can continue his aspirin and Plavix.  They request Dr. Myra Gianotti to perform the procedure and I will get this scheduled on a Tuesday in the near future.     Maeola Harman MD [facility]

## 2021-12-11 NOTE — Progress Notes (Signed)
° °Patient ID: Ahmet F Barbone, male   DOB: 10/13/1951, 70 y.o.   MRN: [id number] ° °Reason for Consult: Follow-up °  °Referred by Paz, Jose E, MD ° °Subjective:  °   °HPI: ° °Lonnie [name] is a 70 y.o. male history of previous left lower extremity endovascular right lower extremity bypass that resulted in amputation.  He now has a shin lesion on the left.  It was previously larger but then resolved and now has been present again for several weeks.  He does use his limb to get around in his wheelchair and pivot.  He has no foot ulcerations. He is on aspirin, Plavix and statin. ° °Past Medical History:  °Diagnosis Date  ° Allergy   ° Anxiety   ° Arthritis   ° left neck, shoulder, knee  ° CAD (coronary artery disease)   ° a. anterior STEMI 10/2013 s/p 4V CABG with LIMA to mid LAD, SVG to OM, SVG to PDA, SVG to Diagonal.  ° Chronic combined systolic and diastolic CHF (congestive heart failure) (HCC)   ° Dementia (HCC)   ° Depression   ° Falls   ° Hypertension   ° Ischemic cardiomyopathy   ° a. EF 40-45% at time of CABG and in 2018.  ° MI (myocardial infarction) (HCC)   ° Peripheral neuropathy   ° Prediabetes 09/01/2014  ° PVD (peripheral vascular disease) (HCC)   ° a. s/p L SFA stents with now known bilateral SFA. b. right femoral to below the knee bypass by Dr. Brabham in 12/2019, c/b infection 02/2020.  ° Stroke (HCC)   ° seen on CT Scan  ° Subdural hematoma   ° Tobacco abuse   ° UTI (urinary tract infection)   ° °Family History  °Problem Relation Age of Onset  ° CAD Father 69  ° AAA (abdominal aortic aneurysm) Father   ° Alcohol abuse Father   ° Hypertension Father   ° Diabetes Father   ° Heart attack Father   ° Stroke Mother   ° Arthritis Mother   ° Heart disease Mother   ° Hypertension Mother   ° Heart attack Mother   ° Cardiomyopathy Daughter   ° Colon cancer Neg Hx   ° Prostate cancer Neg Hx   ° °Past Surgical History:  °Procedure Laterality Date  ° ABDOMINAL AORTAGRAM N/A 06/07/2014  ° Procedure: ABDOMINAL  AORTAGRAM;  Surgeon: Muhammad A Arida, MD;  [facility];  Service: Cardiovascular;  Laterality: N/A;  ° ABDOMINAL AORTAGRAM N/A 03/07/2015  ° Procedure: ABDOMINAL AORTAGRAM;  Surgeon: Muhammad A Arida, MD;  [facility];  Service: Cardiovascular;  Laterality: N/A;  ° ABDOMINAL AORTOGRAM W/LOWER EXTREMITY N/A 01/18/2020  ° Procedure: ABDOMINAL AORTOGRAM W/LOWER EXTREMITY - Right;  Surgeon: Arida, Muhammad A, MD;  [facility];  Service: Cardiovascular;  Laterality: N/A;  ° AMPUTATION Right 07/02/2021  ° Procedure: RIGHT ABOVE KNEE AMPUTATION;  Surgeon: Fields, Charles E, MD;  [facility];  Service: Vascular;  Laterality: Right;  ° ANGIOPLASTY Right 06/08/2021  ° Procedure: BALLOON ANGIOPLASTY;  Surgeon: Brabham, Vance W, MD;  [facility];  Service: Vascular;  Laterality: Right;  ° APPENDECTOMY    ° APPLICATION OF WOUND VAC Right 06/27/2021  ° Procedure: APPLICATION OF WOUND VAC;  Surgeon: Clark, Christopher J, MD;  [facility];  Service: Vascular;  Laterality: Right;  ° COLONOSCOPY WITH PROPOFOL N/A 10/11/2015  ° Procedure: COLONOSCOPY WITH PROPOFOL;  Surgeon: Jyothi Mann, MD;  Location: [facility]   ENDOSCOPY;  Service: Endoscopy;  Laterality: N/A;  ° CORONARY ARTERY BYPASS GRAFT N/A 11/04/2013  ° Procedure: CORONARY ARTERY BYPASS GRAFTING (CABG) TIMES FOUR  USING LEFT INTERNAL MAMMARY ARTERY AND RIGHT AND LEFT SAPHENOUS LEG VEIN HARVESTED ENDOSCOPICALLY;  Surgeon: Steven C Hendrickson, MD;  [facility];  Service: Open Heart Surgery;  Laterality: N/A;  ° CRANIOTOMY Left 06/30/2019  ° Procedure: Frontal CRANIOTOMY HEMATOMA EVACUATION SUBDURAL;  Surgeon: Nundkumar, Neelesh, MD;  [facility];  Service: Neurosurgery;  Laterality: Left;  Frontal CRANIOTOMY HEMATOMA EVACUATION SUBDURAL  ° FEMORAL-POPLITEAL BYPASS GRAFT Right 01/20/2020  ° Procedure: BYPASS GRAFT FEMORAL-POPLITEAL ARTERY RIGHT LEG USING GORE PROPATEN GRAFT;  Surgeon: Brabham, Vance W, MD;  [facility];  Service:  Vascular;  Laterality: Right;  ° FINGER SURGERY  2017  ° injury  ° I & D EXTREMITY Right 03/02/2020  ° Procedure: IRRIGATION AND DEBRIDEMENT EXTREMITY right  lower leg  with Antibiotic beads.;  Surgeon: Dickson, Christopher S, MD;  [facility];  Service: Vascular;  Laterality: Right;  ° I & D EXTREMITY Right 06/27/2021  ° Procedure: IRRIGATION AND DEBRIDEMENT OF RIGHT LEG;  Surgeon: Clark, Christopher J, MD;  [facility];  Service: Vascular;  Laterality: Right;  ° INSERTION OF ILIAC STENT Right 06/08/2021  ° Procedure: INSERTION OF RIGHT UPPER LEG STENT;  Surgeon: Brabham, Vance W, MD;  [facility];  Service: Vascular;  Laterality: Right;  ° KNEE SURGERY    ° fractured patella  ° LEFT HEART CATH N/A 10/29/2013  ° Procedure: LEFT HEART CATH;  Surgeon: Christopher D McAlhany, MD;  [facility];  Service: Cardiovascular;  Laterality: N/A;  ° LEFT HEART CATHETERIZATION WITH CORONARY ANGIOGRAM N/A 10/31/2013  ° Procedure: LEFT HEART CATHETERIZATION WITH CORONARY ANGIOGRAM;  Surgeon: Christopher D McAlhany, MD;  [facility];  Service: Cardiovascular;  Laterality: N/A;  ° LIGATION OF CILIAC ARTERY Right 06/27/2021  ° Procedure: LIGATION OF POPITEAL ARTERY;  Surgeon: Clark, Christopher J, MD;  [facility];  Service: Vascular;  Laterality: Right;  ° LOWER EXTREMITY ANGIOGRAPHY Right 06/07/2021  ° Procedure: Lower Extremity Angiography;  Surgeon: Fields, Charles E, MD;  [facility];  Service: Cardiovascular;  Laterality: Right;  W/ ABD   ° MOUTH SURGERY    ° PATCH ANGIOPLASTY Right 07/08/2021  ° Procedure: PATCH ANGIOPLASTY WITH 1X6 XENOSURE PATCH;  Surgeon: Fields, Charles E, MD;  [facility];  Service: Vascular;  Laterality: Right;  ° PERIPHERAL VASCULAR THROMBECTOMY Right 06/07/2021  ° Procedure: PERIPHERAL VASCULAR THROMBECTOMY;  Surgeon: Fields, Charles E, MD;  [facility];  Service: Cardiovascular;  Laterality: Right;  ° REMOVAL OF GRAFT Right 07/08/2021  °  Procedure: REMOVAL RIGHT FEMORAL BYPASS GRAFT;  Surgeon: Fields, Charles E, MD;  [facility];  Service: Vascular;  Laterality: Right;  ° TOE SURGERY    ° VEIN REPAIR Right 06/27/2021  ° Procedure: REPAIR OF POPITEAL VEIN;  Surgeon: Clark, Christopher J, MD;  [facility];  Service: Vascular;  Laterality: Right;  ° ° °Short Social History:  °Social History  ° °Tobacco Use  ° Smoking status: Former  °  Packs/day: 0.25  °  Years: 30.00  °  Pack years: 7.50  °  Types: Cigarettes  °  Quit date: 05/2021  °  Years since quitting: 0.5  ° Smokeless tobacco: Former  °  Quit date: 05/2021  ° Tobacco comments:  °  < 1/2 ppd  °Substance Use Topics  ° Alcohol   use: No  °  Alcohol/week: 0.0 standard drinks  ° ° °Allergies  °Allergen Reactions  ° Ace Inhibitors Swelling and Other (See Comments)  °  Angioedema  ° Eggs Or Egg-Derived Products Nausea Only and Other (See Comments)  °  Cannot eat prepared eggs- Occasional nausea  ° Lactose Intolerance (Gi) Diarrhea, Nausea Only and Other (See Comments)  °  Flatulence, also  ° Latex Itching  ° Peanut-Containing Drug Products Nausea And Vomiting  °  Does not know which nuts, but states nuts make him vomit  ° ° °Current Outpatient Medications  °Medication Sig Dispense Refill  ° acetaminophen (TYLENOL) 500 MG tablet Take 1,000 mg by mouth in the morning and at bedtime.    ° aspirin EC 81 MG tablet Take 81 mg by mouth daily.    ° atorvastatin (LIPITOR) 80 MG tablet TAKE 1 TABLET BY MOUTH  DAILY 90 tablet 3  ° Calcium Carb-Cholecalciferol (CALCIUM 600+D3 PO) Take 1 tablet by mouth daily with breakfast.    ° ciprofloxacin (CIPRO) 500 MG tablet Take 500 mg by mouth 2 (two) times daily.    ° citalopram (CELEXA) 20 MG tablet TAKE ONE-HALF TABLET BY  MOUTH TWICE DAILY 90 tablet 1  ° clonazePAM (KLONOPIN) 0.25 MG disintegrating tablet DISSOLVE 1 TABLET ON THE  TONGUE TWICE DAILY AS  NEEDED (Patient taking differently: daily.) 180 tablet 0  ° clopidogrel (PLAVIX) 75 MG tablet TAKE 1 TABLET BY  MOUTH  DAILY 90 tablet 1  ° COVID-19 mRNA bivalent vaccine, Moderna, (MODERNA COVID-19 BIVAL BOOSTER) 50 MCG/0.5ML injection Inject into the muscle. 0.5 mL 0  ° docusate sodium (COLACE) 100 MG capsule Take 100 mg by mouth daily.    ° Ferrous Fumarate (HEMOCYTE - 106 MG FE) 324 (106 Fe) MG TABS tablet Take 1 tablet (106 mg of iron total) by mouth daily. 90 tablet 1  ° furosemide (LASIX) 20 MG tablet TAKE 1 TABLET BY MOUTH DAILY AS NEEDED FOR ANKLE SWELLING (Patient taking differently: 20 mg daily.) 90 tablet 0  ° gabapentin (NEURONTIN) 100 MG capsule TAKE 1 CAPSULE BY MOUTH  TWICE DAILY 180 capsule 3  ° irbesartan (AVAPRO) 300 MG tablet TAKE 1 TABLET BY MOUTH  DAILY 90 tablet 3  ° Liniments (BEN GAY EX) Apply 1 application topically daily as needed (for back pain).    ° melatonin 5 MG TABS Take 10 mg by mouth every evening.    ° metoprolol tartrate (LOPRESSOR) 25 MG tablet Take 0.5 tablets (12.5 mg total) by mouth 2 (two) times daily. 90 tablet 1  ° Multiple Vitamin (MULTIVITAMIN WITH MINERALS) TABS tablet Take 1 tablet by mouth daily.    ° nicotine (NICODERM CQ - DOSED IN MG/24 HR) 7 mg/24hr patch Place 1 patch (7 mg total) onto the skin daily as needed. 30 patch 0  ° oxyCODONE (ROXICODONE) 5 MG immediate release tablet Take 1 tablet (5 mg total) by mouth every 6 (six) hours as needed. 20 tablet 0  ° pantoprazole (PROTONIX) 40 MG tablet Take 1 tablet (40 mg total) by mouth at bedtime. 90 tablet 3  ° QUEtiapine (SEROQUEL) 200 MG tablet TAKE 1 TABLET BY MOUTH  TWICE DAILY 180 tablet 3  ° tamsulosin (FLOMAX) 0.4 MG CAPS capsule TAKE 2 CAPSULES BY MOUTH  DAILY AFTER SUPPER 180 capsule 1  ° °No current facility-administered medications for this visit.  ° ° °Review of Systems  °Constitutional:  Constitutional negative. °HENT: HENT negative.  °Eyes: Eyes negative.  °Cardiovascular: Cardiovascular negative.  °GI:   Gastrointestinal negative.  °Musculoskeletal: Musculoskeletal negative.  °Skin: Positive for wound.   °Neurological: Neurological negative. °Hematologic: Hematologic/lymphatic negative.  °Psychiatric: Psychiatric negative.   ° °   °Objective:  °Objective  ° °Vitals:  ° 12/11/21 1205  °BP: 134/71  °Pulse: (!) 52  °Resp: 20  °SpO2: 97%  °Weight: 205 lb (93 kg)  °Height: 5' 11" (1.803 m)  ° °Body mass index is 28.59 kg/m². ° °Physical Exam °HENT:  °   Head: Normocephalic.  °   Nose:  °   Comments: Wearing a mask °Eyes:  °   Pupils: Pupils are equal, round, and reactive to light.  °Cardiovascular:  °   Pulses:     °     Femoral pulses are 1+ on the right side and 2+ on the left side. °Pulmonary:  °   Effort: Pulmonary effort is normal.  °Abdominal:  °   General: Abdomen is flat.  °   Palpations: Abdomen is soft. There is no mass.  °Musculoskeletal:  °   Left lower leg: No edema.  °   Comments: Right lower extremity amputation well-healed  °Neurological:  °   General: No focal deficit present.  °   Mental Status: He is alert.  °Psychiatric:     °   Mood and Affect: Mood normal.  ° ° ° °Data: °ABI Findings:  °+--------+------------------+-----+--------+--------+  ° Right    Rt Pressure (mmHg) Index Waveform Comment    °+--------+------------------+-----+--------+--------+  ° Brachial 120                                          °+--------+------------------+-----+--------+--------+  ° °+--------+------------------+-----+-------------------+-------+  ° Left     Lt Pressure (mmHg) Index Waveform            Comment   °+--------+------------------+-----+-------------------+-------+  ° Brachial 116                                                    °+--------+------------------+-----+-------------------+-------+  ° PTA      54                 0.45  monophasic                    °+--------+------------------+-----+-------------------+-------+  ° DP       43                 0.36  dampened monophasic           °+--------+------------------+-----+-------------------+-------+   ° °+-------+-----------+-----------+------------+------------+  ° ABI/TBI Today's ABI Today's TBI Previous ABI Previous TBI   °+-------+-----------+-----------+------------+------------+  ° Right   AKA         AKA         AKA          AKA            °+-------+-----------+-----------+------------+------------+  ° Left    0.45        0           0.52         0.13           °+-------+-----------+-----------+------------+------------+  ° °  +-----------+--------+-----+--------+----------+------------------------+  ° LEFT        PSV cm/s Ratio   Stenosis Waveform   Comments                   °+-----------+--------+-----+--------+----------+------------------------+  ° CFA Prox    173                     monophasic brisk                      °+-----------+--------+-----+--------+----------+------------------------+  ° DFA         210                     monophasic brisk                      °+-----------+--------+-----+--------+----------+------------------------+  ° SFA Prox    74                      monophasic brisk                      °+-----------+--------+-----+--------+----------+------------------------+  ° SFA Mid     29                      monophasic proximal to stent          °+-----------+--------+-----+--------+----------+------------------------+  ° POP Mid                    occluded            ? occlusion / collateral   °+-----------+--------+-----+--------+----------+------------------------+  ° POP Distal  31                      monophasic dampened, collateral       °+-----------+--------+-----+--------+----------+------------------------+  ° ATA Distal  16                      monophasic dampened                   °+-----------+--------+-----+--------+----------+------------------------+  ° PTA Distal  11                      monophasic dampened                   °+-----------+--------+-----+--------+----------+------------------------+  ° PERO Distal 11                      monophasic dampened                    °+-----------+--------+-----+--------+----------+------------------------+  ° °   °Left Stent(s):  °+---------------+-------------+---------------+----------+-----------------  °----+  ° Prox to Stent   9                             monophasic                   °       °+---------------+-------------+---------------+----------+-----------------  °----+  ° Proximal Stent  62                            monophasic                   °       °+---------------+-------------+---------------+----------+-----------------  °----+  ° Mid thigh Stent 67 / 334 / 18   50-99% stenosis            collateral  °visualized   °+---------------+-------------+---------------+----------+-----------------  °----+  ° Distal Stent    21 /386 thump 50-99% stenosis            mid to dst  °thigh,       °                                                          collateral  °visualized   °+---------------+-------------+---------------+----------+-----------------  °----+  ° °  ° °Summary:  °Left: 50 - 99% stenosis mid and distal thigh stent. Miminal to no flow in  °the distal thigh with visualized collateral. Possible occlusion in the  °popliteal with collateral feeding on distally. °    °Assessment/Plan:  °  ° °62-year-old male with wound of his left lower extremity as pictured above with mildly decreased ABI and likely occluded mid SFA where previously he had intervention in the thigh with stenting.  I discussed with the patient and his daughter to repeat angiography from a right common femoral approach.  There is a very strong left common femoral pulse the right common femoral pulses somewhat diminished but should be accessible.  He can continue his aspirin and Plavix.  They request Dr. Brabham to perform the procedure and I will get this scheduled on a Tuesday in the near future. ° °  ° °Kayo Zion Christopher Juelz Whittenberg MD °[facility] ° ° °

## 2021-12-13 ENCOUNTER — Ambulatory Visit: Payer: Medicare Other

## 2021-12-31 ENCOUNTER — Other Ambulatory Visit: Payer: Self-pay

## 2021-12-31 ENCOUNTER — Encounter (HOSPITAL_COMMUNITY): Payer: Self-pay | Admitting: Surgery

## 2021-12-31 ENCOUNTER — Inpatient Hospital Stay (HOSPITAL_COMMUNITY)
Admission: AD | Admit: 2021-12-31 | Discharge: 2022-01-03 | DRG: 315 | Disposition: A | Discharge status: Skilled Nursing Facility | Payer: Medicare Other | Attending: Surgery | Admitting: Surgery

## 2021-12-31 ENCOUNTER — Inpatient Hospital Stay (HOSPITAL_COMMUNITY)
Admission: AD | Disposition: A | Discharge status: Skilled Nursing Facility | Payer: Self-pay | Source: Home / Self Care | Attending: Surgery

## 2021-12-31 DIAGNOSIS — Z833 Family history of diabetes mellitus: Secondary | ICD-10-CM | POA: Diagnosis not present

## 2021-12-31 DIAGNOSIS — Z1159 Encounter for screening for other viral diseases: Secondary | ICD-10-CM | POA: Diagnosis not present

## 2021-12-31 DIAGNOSIS — Z89611 Acquired absence of right leg above knee: Secondary | ICD-10-CM

## 2021-12-31 DIAGNOSIS — Z91010 Allergy to peanuts: Secondary | ICD-10-CM

## 2021-12-31 DIAGNOSIS — F0390 Unspecified dementia without behavioral disturbance: Secondary | ICD-10-CM | POA: Diagnosis present

## 2021-12-31 DIAGNOSIS — Z811 Family history of alcohol abuse and dependence: Secondary | ICD-10-CM

## 2021-12-31 DIAGNOSIS — R7303 Prediabetes: Secondary | ICD-10-CM | POA: Diagnosis not present

## 2021-12-31 DIAGNOSIS — Z951 Presence of aortocoronary bypass graft: Secondary | ICD-10-CM | POA: Diagnosis not present

## 2021-12-31 DIAGNOSIS — Z79899 Other long term (current) drug therapy: Secondary | ICD-10-CM | POA: Diagnosis not present

## 2021-12-31 DIAGNOSIS — Z7982 Long term (current) use of aspirin: Secondary | ICD-10-CM | POA: Diagnosis not present

## 2021-12-31 DIAGNOSIS — Z89511 Acquired absence of right leg below knee: Secondary | ICD-10-CM | POA: Diagnosis not present

## 2021-12-31 DIAGNOSIS — E46 Unspecified protein-calorie malnutrition: Secondary | ICD-10-CM | POA: Diagnosis not present

## 2021-12-31 DIAGNOSIS — Z8261 Family history of arthritis: Secondary | ICD-10-CM | POA: Diagnosis not present

## 2021-12-31 DIAGNOSIS — Z8673 Personal history of transient ischemic attack (TIA), and cerebral infarction without residual deficits: Secondary | ICD-10-CM | POA: Diagnosis not present

## 2021-12-31 DIAGNOSIS — I5042 Chronic combined systolic (congestive) and diastolic (congestive) heart failure: Secondary | ICD-10-CM | POA: Diagnosis present

## 2021-12-31 DIAGNOSIS — I2510 Atherosclerotic heart disease of native coronary artery without angina pectoris: Secondary | ICD-10-CM | POA: Diagnosis not present

## 2021-12-31 DIAGNOSIS — L97821 Non-pressure chronic ulcer of other part of left lower leg limited to breakdown of skin: Secondary | ICD-10-CM | POA: Diagnosis not present

## 2021-12-31 DIAGNOSIS — I739 Peripheral vascular disease, unspecified: Secondary | ICD-10-CM | POA: Diagnosis not present

## 2021-12-31 DIAGNOSIS — Z7902 Long term (current) use of antithrombotics/antiplatelets: Secondary | ICD-10-CM

## 2021-12-31 DIAGNOSIS — D508 Other iron deficiency anemias: Secondary | ICD-10-CM | POA: Diagnosis not present

## 2021-12-31 DIAGNOSIS — M6281 Muscle weakness (generalized): Secondary | ICD-10-CM | POA: Diagnosis not present

## 2021-12-31 DIAGNOSIS — K219 Gastro-esophageal reflux disease without esophagitis: Secondary | ICD-10-CM | POA: Diagnosis not present

## 2021-12-31 DIAGNOSIS — I255 Ischemic cardiomyopathy: Secondary | ICD-10-CM | POA: Diagnosis present

## 2021-12-31 DIAGNOSIS — E739 Lactose intolerance, unspecified: Secondary | ICD-10-CM | POA: Diagnosis present

## 2021-12-31 DIAGNOSIS — Z87820 Personal history of traumatic brain injury: Secondary | ICD-10-CM | POA: Diagnosis not present

## 2021-12-31 DIAGNOSIS — Z91040 Latex allergy status: Secondary | ICD-10-CM

## 2021-12-31 DIAGNOSIS — R279 Unspecified lack of coordination: Secondary | ICD-10-CM | POA: Diagnosis not present

## 2021-12-31 DIAGNOSIS — G629 Polyneuropathy, unspecified: Secondary | ICD-10-CM | POA: Diagnosis present

## 2021-12-31 DIAGNOSIS — Z720 Tobacco use: Secondary | ICD-10-CM | POA: Diagnosis not present

## 2021-12-31 DIAGNOSIS — Z888 Allergy status to other drugs, medicaments and biological substances status: Secondary | ICD-10-CM

## 2021-12-31 DIAGNOSIS — I110 Hypertensive heart disease with heart failure: Secondary | ICD-10-CM | POA: Diagnosis present

## 2021-12-31 DIAGNOSIS — Y713 Surgical instruments, materials and cardiovascular devices (including sutures) associated with adverse incidents: Secondary | ICD-10-CM | POA: Diagnosis present

## 2021-12-31 DIAGNOSIS — I70248 Atherosclerosis of native arteries of left leg with ulceration of other part of lower left leg: Secondary | ICD-10-CM | POA: Diagnosis present

## 2021-12-31 DIAGNOSIS — L97921 Non-pressure chronic ulcer of unspecified part of left lower leg limited to breakdown of skin: Secondary | ICD-10-CM | POA: Diagnosis present

## 2021-12-31 DIAGNOSIS — Z823 Family history of stroke: Secondary | ICD-10-CM | POA: Diagnosis not present

## 2021-12-31 DIAGNOSIS — Z743 Need for continuous supervision: Secondary | ICD-10-CM | POA: Diagnosis not present

## 2021-12-31 DIAGNOSIS — I10 Essential (primary) hypertension: Secondary | ICD-10-CM | POA: Diagnosis not present

## 2021-12-31 DIAGNOSIS — Z20822 Contact with and (suspected) exposure to covid-19: Secondary | ICD-10-CM | POA: Diagnosis not present

## 2021-12-31 DIAGNOSIS — Z8616 Personal history of COVID-19: Secondary | ICD-10-CM | POA: Diagnosis not present

## 2021-12-31 DIAGNOSIS — Z91012 Allergy to eggs: Secondary | ICD-10-CM

## 2021-12-31 DIAGNOSIS — G9009 Other idiopathic peripheral autonomic neuropathy: Secondary | ICD-10-CM | POA: Diagnosis not present

## 2021-12-31 DIAGNOSIS — Z8249 Family history of ischemic heart disease and other diseases of the circulatory system: Secondary | ICD-10-CM

## 2021-12-31 DIAGNOSIS — T82856A Stenosis of peripheral vascular stent, initial encounter: Secondary | ICD-10-CM | POA: Diagnosis not present

## 2021-12-31 DIAGNOSIS — F32A Depression, unspecified: Secondary | ICD-10-CM | POA: Diagnosis not present

## 2021-12-31 HISTORY — PX: ABDOMINAL AORTOGRAM W/LOWER EXTREMITY: CATH118223

## 2021-12-31 LAB — POCT I-STAT, CHEM 8
BUN: 24 mg/dL — ABNORMAL HIGH (ref 8–23)
Calcium, Ion: 1.27 mmol/L (ref 1.15–1.40)
Chloride: 104 mmol/L (ref 98–111)
Creatinine, Ser: 1.2 mg/dL (ref 0.61–1.24)
Glucose, Bld: 107 mg/dL — ABNORMAL HIGH (ref 70–99)
HCT: 37 % — ABNORMAL LOW (ref 39.0–52.0)
Hemoglobin: 12.6 g/dL — ABNORMAL LOW (ref 13.0–17.0)
Potassium: 4.2 mmol/L (ref 3.5–5.1)
Sodium: 141 mmol/L (ref 135–145)
TCO2: 26 mmol/L (ref 22–32)

## 2021-12-31 LAB — MRSA NEXT GEN BY PCR, NASAL: MRSA by PCR Next Gen: NOT DETECTED

## 2021-12-31 LAB — SARS CORONAVIRUS 2 (TAT 6-24 HRS): SARS Coronavirus 2: NEGATIVE

## 2021-12-31 SURGERY — ABDOMINAL AORTOGRAM W/LOWER EXTREMITY
Anesthesia: LOCAL | Laterality: Left

## 2021-12-31 MED ORDER — SODIUM CHLORIDE 0.9 % IV SOLN: INTRAVENOUS | Status: DC

## 2021-12-31 MED ORDER — SODIUM CHLORIDE 0.9% FLUSH
3.0000 mL | Freq: Two times a day (BID) | INTRAVENOUS | Status: DC
  Administered 2021-12-31 – 2022-01-03 (×5): 3 mL via INTRAVENOUS

## 2021-12-31 MED ORDER — SODIUM CHLORIDE 0.9% FLUSH: 3.0000 mL | INTRAVENOUS | Status: DC | PRN

## 2021-12-31 MED ORDER — HEPARIN (PORCINE) IN NACL 1000-0.9 UT/500ML-% IV SOLN
INTRAVENOUS | Status: AC
  Filled 2021-12-31: qty 1000

## 2021-12-31 MED ORDER — CHLORHEXIDINE GLUCONATE CLOTH 2 % EX PADS
6.0000 | MEDICATED_PAD | Freq: Every day | CUTANEOUS | Status: DC
  Administered 2021-12-31: 6 via TOPICAL

## 2021-12-31 MED ORDER — SODIUM CHLORIDE 0.9 % IV SOLN: 250.0000 mL | INTRAVENOUS | Status: DC | PRN

## 2021-12-31 MED ORDER — MIDAZOLAM HCL 5 MG/5ML IJ SOLN
INTRAMUSCULAR | Status: AC
  Filled 2021-12-31: qty 5

## 2021-12-31 MED ORDER — HYDRALAZINE HCL 20 MG/ML IJ SOLN: 5.0000 mg | INTRAMUSCULAR | Status: DC | PRN

## 2021-12-31 MED ORDER — FENTANYL CITRATE (PF) 100 MCG/2ML IJ SOLN
INTRAMUSCULAR | Status: AC
  Filled 2021-12-31: qty 2

## 2021-12-31 MED ORDER — LIDOCAINE HCL (PF) 1 % IJ SOLN
INTRAMUSCULAR | Status: AC
  Filled 2021-12-31: qty 30

## 2021-12-31 MED ORDER — IODIXANOL 320 MG/ML IV SOLN
INTRAVENOUS | Status: DC | PRN
  Administered 2021-12-31: 66 mL

## 2021-12-31 MED ORDER — FENTANYL CITRATE (PF) 100 MCG/2ML IJ SOLN
INTRAMUSCULAR | Status: DC | PRN
  Administered 2021-12-31: 50 ug via INTRAVENOUS

## 2021-12-31 MED ORDER — LIDOCAINE HCL (PF) 1 % IJ SOLN
INTRAMUSCULAR | Status: DC | PRN
  Administered 2021-12-31: 12 mL

## 2021-12-31 MED ORDER — ACETAMINOPHEN 325 MG PO TABS: 650.0000 mg | ORAL_TABLET | ORAL | Status: DC | PRN

## 2021-12-31 MED ORDER — HEPARIN (PORCINE) IN NACL 1000-0.9 UT/500ML-% IV SOLN
INTRAVENOUS | Status: DC | PRN
  Administered 2021-12-31 (×2): 500 mL

## 2021-12-31 MED ORDER — ONDANSETRON HCL 4 MG/2ML IJ SOLN: 4.0000 mg | Freq: Four times a day (QID) | INTRAMUSCULAR | Status: DC | PRN

## 2021-12-31 MED ORDER — LABETALOL HCL 5 MG/ML IV SOLN: 10.0000 mg | INTRAVENOUS | Status: DC | PRN

## 2021-12-31 MED ORDER — SODIUM CHLORIDE 0.9 % WEIGHT BASED INFUSION: 1.0000 mL/kg/h | INTRAVENOUS | Status: AC

## 2021-12-31 MED ORDER — MIDAZOLAM HCL 2 MG/2ML IJ SOLN
INTRAMUSCULAR | Status: DC | PRN
  Administered 2021-12-31: 1 mg via INTRAVENOUS

## 2021-12-31 SURGICAL SUPPLY — 11 items
CATH OMNI FLUSH 5F 65CM (CATHETERS) ×1 IMPLANT
KIT MICROPUNCTURE NIT STIFF (SHEATH) ×1 IMPLANT
KIT PV (KITS) ×3 IMPLANT
SHEATH PINNACLE 5F 10CM (SHEATH) ×1 IMPLANT
SHEATH PROBE COVER 6X72 (BAG) ×1 IMPLANT
STOPCOCK MORSE 400PSI 3WAY (MISCELLANEOUS) ×1 IMPLANT
SYR MEDRAD MARK 7 150ML (SYRINGE) ×3 IMPLANT
TRANSDUCER W/STOPCOCK (MISCELLANEOUS) ×3 IMPLANT
TRAY PV CATH (CUSTOM PROCEDURE TRAY) ×3 IMPLANT
TUBING CIL FLEX 10 FLL-RA (TUBING) ×1 IMPLANT
WIRE BENTSON .035X145CM (WIRE) ×1 IMPLANT

## 2021-12-31 NOTE — Interval H&P Note (Signed)
History and Physical Interval Note:  12/31/2021 7:54 AM  Lonnie [name]  has presented today for surgery, with the diagnosis of pad.  The various methods of treatment have been discussed with the patient and family. After consideration of risks, benefits and other options for treatment, the patient has consented to  Procedure(s): ABDOMINAL AORTOGRAM W/LOWER EXTREMITY (N/A) as a surgical intervention.  The patient's history has been reviewed, patient examined, no change in status, stable for surgery.  I have reviewed the patient's chart and labs.  Questions were answered to the patient's satisfaction.     Annamarie Major

## 2021-12-31 NOTE — Op Note (Signed)
° ° °  Patient name: Lonnie [name] MRN: [id number] DOB: 12-10-1951 Sex: male  12/31/2021 Pre-operative Diagnosis: Left leg ulcer Post-operative diagnosis:  Same Surgeon:  [name] Procedure Performed:  1.  Ultrasound-guided access, right femoral artery  2.  Abdominal aortogram  3.  Left lower extremity runoff  4.  Second-order catheterization  5.  Conscious sedation, 27 minutes    Indications: This is a 71 year old gentleman with previous right above-knee amputation who comes in with a left leg ulcer.  He is here for angiogram  Procedure:  The patient was identified in the holding area and taken to room 8.  The patient was then placed supine on the table and prepped and draped in the usual sterile fashion.  A time out was called.  Conscious sedation was administered with the use of IV fentanyl and Versed under continuous physician and nurse monitoring.  Heart rate, blood pressure, and oxygen saturation were continuously monitored.  Total sedation time was 27 minutes.  Ultrasound was used to evaluate the right common femoral artery.  It was patent .  A digital ultrasound image was acquired.  A micropuncture needle was used to access the right common femoral artery under ultrasound guidance.  An 018 wire was advanced without resistance and a micropuncture sheath was placed.  The 018 wire was removed and a benson wire was placed.  The micropuncture sheath was exchanged for a 5 french sheath.  An omniflush catheter was advanced over the wire to the level of L-1.  An abdominal angiogram was obtained.  Next, using the omniflush catheter and a benson wire, the aortic bifurcation was crossed and the catheter was placed into theleft external iliac artery and left runoff was obtained.  Findings:   Aortogram: No significant renal artery stenosis was identified.  The infrarenal abdominal aorta is widely patent.  Bilateral common and external iliac arteries widely patent.  Right Lower Extremity: Not  evaluated  Left Lower Extremity: Left common femoral profundofemoral artery are widely patent.  The superficial femoral artery occludes at the level of the previously placed stents in the adductor canal.  There is reconstitution of the above-knee popliteal artery.  The dominant runoff is the peroneal artery which collateralizes to the posterior tibial at the ankle.  The anterior tibial is very diminutive.  Intervention: None  Impression:  #1  Occluded left superficial femoral and popliteal stents.  If the patient requires revascularization, he will need a left femoral to below-knee popliteal bypass graft.    Juleen China, M.D., [facility] [place] Office: [phone] Pager:  [phone]

## 2021-12-31 NOTE — Progress Notes (Signed)
The patient will be admitted overnight for observation following his arteriogram.  His wound appears to be nearly healed and so surgical revascularization will be delayed and possibly avoided.  After speaking with the family, they would like assistance in getting either at home physical therapy or possibly inpatient placement for therapy to help with strength training and learning to utilize his prosthesis for his right leg.  Annamarie Major

## 2021-12-31 NOTE — Plan of Care (Signed)
°  Problem: Clinical Measurements: Goal: Ability to maintain clinical measurements within normal limits will improve Outcome: Progressing Goal: Will remain free from infection Outcome: Progressing Goal: Respiratory complications will improve Outcome: Progressing Goal: Cardiovascular complication will be avoided Outcome: Progressing   Problem: Nutrition: Goal: Adequate nutrition will be maintained Outcome: Progressing   Problem: Coping: Goal: Level of anxiety will decrease Outcome: Progressing   Problem: Elimination: Goal: Will not experience complications related to urinary retention Outcome: Progressing   Problem: Pain Managment: Goal: General experience of comfort will improve Outcome: Progressing

## 2021-12-31 NOTE — TOC Progression Note (Signed)
Transition of Care [facility]) - Progression Note    Patient Details  Name: Lonnie [name] MRN: [id number] Date of Birth: [birth year]  Transition of Care [facility]) CM/SW Contact  Angelita Ingles, RN Phone Number:[phone]  12/31/2021, 3:11 PM  Clinical Narrative:     Transition of Care [facility]) Screening Note   Patient Details  Name: Lonnie [name] Date of Birth: January 24, 1951   Transition of Care [facility]) CM/SW Contact:    Angelita Ingles, RN Phone Number: 12/31/2021, 3:11 PM    Transition of Care Department [facility]) has reviewed patient and no TOC needs have been identified at this time. We will continue to monitor patient advancement through interdisciplinary progression rounds. If new patient transition needs arise, please place a TOC consult.          Expected Discharge Plan and Services           Expected Discharge Date: 12/31/21                                     Social Determinants of Health (SDOH) Interventions    Readmission Risk Interventions Readmission Risk Prevention Plan 06/14/2021 09/12/2020 03/12/2020  Post Dischage Appt Complete - -  Medication Screening Complete - -  Transportation Screening Complete Complete -  PCP or Specialist Appt within 3-5 Days - Complete -  HRI or [place] - Complete -  Social Work Consult for [place] Planning/Counseling - Complete -  Palliative Care Screening - Not Applicable -  Medication Review [employer]) - Complete Complete  Some recent data might be hidden

## 2021-12-31 NOTE — Plan of Care (Signed)
  Problem: Education: Goal: Knowledge of General Education information will improve Description: Including pain rating scale, medication(s)/side effects and non-pharmacologic comfort measures Outcome: Progressing   Problem: Health Behavior/Discharge Planning: Goal: Ability to manage health-related needs will improve Outcome: Progressing   Problem: Clinical Measurements: Goal: Ability to maintain clinical measurements within normal limits will improve Outcome: Progressing Goal: Will remain free from infection Outcome: Progressing   Problem: Nutrition: Goal: Adequate nutrition will be maintained Outcome: Progressing   Problem: Coping: Goal: Level of anxiety will decrease Outcome: Progressing   Problem: Elimination: Goal: Will not experience complications related to bowel motility Outcome: Progressing   Problem: Pain Managment: Goal: General experience of comfort will improve Outcome: Progressing   Problem: Safety: Goal: Ability to remain free from injury will improve Outcome: Progressing   Problem: Skin Integrity: Goal: Risk for impaired skin integrity will decrease Outcome: Progressing   

## 2021-12-31 NOTE — Progress Notes (Addendum)
Site area: right groin a 5 french arterial sheath was removed by Misty Stanley RN  Site Prior to Removal:  Level 0  Pressure Applied For 20 MINUTES    Bedrest Beginning at 0915am X 4 hours  Manual:   Yes.    Patient Status During Pull:  stable  Post Pull Groin Site:  Level 0  Post Pull Instructions Given:  Yes.    Post Pull Pulses Present:  Yes.    Dressing Applied:  Yes.    Comments:

## 2022-01-01 DIAGNOSIS — Z743 Need for continuous supervision: Secondary | ICD-10-CM | POA: Diagnosis not present

## 2022-01-01 DIAGNOSIS — F0390 Unspecified dementia without behavioral disturbance: Secondary | ICD-10-CM | POA: Diagnosis present

## 2022-01-01 DIAGNOSIS — I255 Ischemic cardiomyopathy: Secondary | ICD-10-CM | POA: Diagnosis present

## 2022-01-01 DIAGNOSIS — Z8249 Family history of ischemic heart disease and other diseases of the circulatory system: Secondary | ICD-10-CM | POA: Diagnosis not present

## 2022-01-01 DIAGNOSIS — D508 Other iron deficiency anemias: Secondary | ICD-10-CM | POA: Diagnosis not present

## 2022-01-01 DIAGNOSIS — Z951 Presence of aortocoronary bypass graft: Secondary | ICD-10-CM | POA: Diagnosis not present

## 2022-01-01 DIAGNOSIS — E739 Lactose intolerance, unspecified: Secondary | ICD-10-CM | POA: Diagnosis present

## 2022-01-01 DIAGNOSIS — Z8673 Personal history of transient ischemic attack (TIA), and cerebral infarction without residual deficits: Secondary | ICD-10-CM | POA: Diagnosis not present

## 2022-01-01 DIAGNOSIS — M6281 Muscle weakness (generalized): Secondary | ICD-10-CM | POA: Diagnosis not present

## 2022-01-01 DIAGNOSIS — Z7982 Long term (current) use of aspirin: Secondary | ICD-10-CM | POA: Diagnosis not present

## 2022-01-01 DIAGNOSIS — Z811 Family history of alcohol abuse and dependence: Secondary | ICD-10-CM | POA: Diagnosis not present

## 2022-01-01 DIAGNOSIS — K219 Gastro-esophageal reflux disease without esophagitis: Secondary | ICD-10-CM | POA: Diagnosis not present

## 2022-01-01 DIAGNOSIS — I110 Hypertensive heart disease with heart failure: Secondary | ICD-10-CM | POA: Diagnosis present

## 2022-01-01 DIAGNOSIS — I10 Essential (primary) hypertension: Secondary | ICD-10-CM | POA: Diagnosis not present

## 2022-01-01 DIAGNOSIS — Z1159 Encounter for screening for other viral diseases: Secondary | ICD-10-CM | POA: Diagnosis not present

## 2022-01-01 DIAGNOSIS — Z8616 Personal history of COVID-19: Secondary | ICD-10-CM | POA: Diagnosis not present

## 2022-01-01 DIAGNOSIS — R279 Unspecified lack of coordination: Secondary | ICD-10-CM | POA: Diagnosis not present

## 2022-01-01 DIAGNOSIS — Z833 Family history of diabetes mellitus: Secondary | ICD-10-CM | POA: Diagnosis not present

## 2022-01-01 DIAGNOSIS — Z89511 Acquired absence of right leg below knee: Secondary | ICD-10-CM | POA: Diagnosis not present

## 2022-01-01 DIAGNOSIS — Z888 Allergy status to other drugs, medicaments and biological substances status: Secondary | ICD-10-CM | POA: Diagnosis not present

## 2022-01-01 DIAGNOSIS — Z87820 Personal history of traumatic brain injury: Secondary | ICD-10-CM | POA: Diagnosis not present

## 2022-01-01 DIAGNOSIS — Z20822 Contact with and (suspected) exposure to covid-19: Secondary | ICD-10-CM | POA: Diagnosis present

## 2022-01-01 DIAGNOSIS — Z91012 Allergy to eggs: Secondary | ICD-10-CM | POA: Diagnosis not present

## 2022-01-01 DIAGNOSIS — Z823 Family history of stroke: Secondary | ICD-10-CM | POA: Diagnosis not present

## 2022-01-01 DIAGNOSIS — G9009 Other idiopathic peripheral autonomic neuropathy: Secondary | ICD-10-CM | POA: Diagnosis not present

## 2022-01-01 DIAGNOSIS — I5042 Chronic combined systolic (congestive) and diastolic (congestive) heart failure: Secondary | ICD-10-CM | POA: Diagnosis not present

## 2022-01-01 DIAGNOSIS — Z8261 Family history of arthritis: Secondary | ICD-10-CM | POA: Diagnosis not present

## 2022-01-01 DIAGNOSIS — L97921 Non-pressure chronic ulcer of unspecified part of left lower leg limited to breakdown of skin: Secondary | ICD-10-CM | POA: Diagnosis present

## 2022-01-01 DIAGNOSIS — I2510 Atherosclerotic heart disease of native coronary artery without angina pectoris: Secondary | ICD-10-CM | POA: Diagnosis not present

## 2022-01-01 DIAGNOSIS — Z720 Tobacco use: Secondary | ICD-10-CM | POA: Diagnosis not present

## 2022-01-01 DIAGNOSIS — I70248 Atherosclerosis of native arteries of left leg with ulceration of other part of lower left leg: Secondary | ICD-10-CM | POA: Diagnosis not present

## 2022-01-01 DIAGNOSIS — G629 Polyneuropathy, unspecified: Secondary | ICD-10-CM | POA: Diagnosis present

## 2022-01-01 DIAGNOSIS — T82856A Stenosis of peripheral vascular stent, initial encounter: Secondary | ICD-10-CM | POA: Diagnosis not present

## 2022-01-01 DIAGNOSIS — R7303 Prediabetes: Secondary | ICD-10-CM | POA: Diagnosis present

## 2022-01-01 DIAGNOSIS — Y713 Surgical instruments, materials and cardiovascular devices (including sutures) associated with adverse incidents: Secondary | ICD-10-CM | POA: Diagnosis present

## 2022-01-01 DIAGNOSIS — E46 Unspecified protein-calorie malnutrition: Secondary | ICD-10-CM | POA: Diagnosis not present

## 2022-01-01 DIAGNOSIS — I739 Peripheral vascular disease, unspecified: Secondary | ICD-10-CM | POA: Diagnosis not present

## 2022-01-01 DIAGNOSIS — Z79899 Other long term (current) drug therapy: Secondary | ICD-10-CM | POA: Diagnosis not present

## 2022-01-01 DIAGNOSIS — F32A Depression, unspecified: Secondary | ICD-10-CM | POA: Diagnosis not present

## 2022-01-01 DIAGNOSIS — Z7902 Long term (current) use of antithrombotics/antiplatelets: Secondary | ICD-10-CM | POA: Diagnosis not present

## 2022-01-01 DIAGNOSIS — L97821 Non-pressure chronic ulcer of other part of left lower leg limited to breakdown of skin: Secondary | ICD-10-CM | POA: Diagnosis present

## 2022-01-01 DIAGNOSIS — Z89611 Acquired absence of right leg above knee: Secondary | ICD-10-CM | POA: Diagnosis not present

## 2022-01-01 MED ORDER — GABAPENTIN 100 MG PO CAPS
100.0000 mg | ORAL_CAPSULE | Freq: Two times a day (BID) | ORAL | Status: DC
  Administered 2022-01-01 – 2022-01-03 (×5): 100 mg via ORAL
  Filled 2022-01-01 (×6): qty 1

## 2022-01-01 MED ORDER — FERROUS FUMARATE 324 (106 FE) MG PO TABS
1.0000 | ORAL_TABLET | Freq: Every day | ORAL | Status: DC
  Administered 2022-01-01 – 2022-01-03 (×3): 106 mg via ORAL
  Filled 2022-01-01 (×3): qty 1

## 2022-01-01 MED ORDER — QUETIAPINE FUMARATE 100 MG PO TABS
200.0000 mg | ORAL_TABLET | Freq: Two times a day (BID) | ORAL | Status: DC
  Administered 2022-01-01 – 2022-01-03 (×5): 200 mg via ORAL
  Filled 2022-01-01 (×6): qty 2

## 2022-01-01 MED ORDER — ATORVASTATIN CALCIUM 80 MG PO TABS
80.0000 mg | ORAL_TABLET | Freq: Every day | ORAL | Status: DC
  Administered 2022-01-01 – 2022-01-03 (×3): 80 mg via ORAL
  Filled 2022-01-01 (×3): qty 1

## 2022-01-01 MED ORDER — MELATONIN 3 MG PO TABS
3.0000 mg | ORAL_TABLET | Freq: Every day | ORAL | Status: DC
  Administered 2022-01-01 – 2022-01-02 (×2): 3 mg via ORAL
  Filled 2022-01-01 (×3): qty 1

## 2022-01-01 MED ORDER — TAMSULOSIN HCL 0.4 MG PO CAPS
0.4000 mg | ORAL_CAPSULE | Freq: Every day | ORAL | Status: DC
  Administered 2022-01-01 – 2022-01-03 (×3): 0.4 mg via ORAL
  Filled 2022-01-01 (×3): qty 1

## 2022-01-01 MED ORDER — IRBESARTAN 150 MG PO TABS
300.0000 mg | ORAL_TABLET | Freq: Every day | ORAL | Status: DC
  Administered 2022-01-01 – 2022-01-03 (×3): 300 mg via ORAL
  Filled 2022-01-01 (×3): qty 2

## 2022-01-01 MED ORDER — PANTOPRAZOLE SODIUM 40 MG PO TBEC
40.0000 mg | DELAYED_RELEASE_TABLET | Freq: Every day | ORAL | Status: DC
  Administered 2022-01-01 – 2022-01-02 (×2): 40 mg via ORAL
  Filled 2022-01-01 (×3): qty 1

## 2022-01-01 MED ORDER — CLONAZEPAM 0.25 MG PO TBDP
0.2500 mg | ORAL_TABLET | Freq: Two times a day (BID) | ORAL | Status: DC
  Administered 2022-01-01 – 2022-01-03 (×5): 0.25 mg via ORAL
  Filled 2022-01-01 (×6): qty 1

## 2022-01-01 MED ORDER — OXYCODONE HCL 5 MG PO TABS: 5.0000 mg | ORAL_TABLET | Freq: Four times a day (QID) | ORAL | 0 refills | Status: DC | PRN

## 2022-01-01 MED ORDER — CITALOPRAM HYDROBROMIDE 20 MG PO TABS
10.0000 mg | ORAL_TABLET | Freq: Two times a day (BID) | ORAL | Status: DC
  Administered 2022-01-01 – 2022-01-03 (×5): 10 mg via ORAL
  Filled 2022-01-01 (×6): qty 1

## 2022-01-01 MED ORDER — OYSTER SHELL CALCIUM/D3 500-5 MG-MCG PO TABS
1.0000 | ORAL_TABLET | Freq: Every day | ORAL | Status: DC
  Administered 2022-01-01 – 2022-01-03 (×3): 1 via ORAL
  Filled 2022-01-01 (×3): qty 1

## 2022-01-01 MED ORDER — ADULT MULTIVITAMIN W/MINERALS CH
1.0000 | ORAL_TABLET | Freq: Every morning | ORAL | Status: DC
  Administered 2022-01-01 – 2022-01-03 (×3): 1 via ORAL
  Filled 2022-01-01 (×3): qty 1

## 2022-01-01 MED ORDER — DOCUSATE SODIUM 100 MG PO CAPS
100.0000 mg | ORAL_CAPSULE | Freq: Every morning | ORAL | Status: DC
  Administered 2022-01-02 – 2022-01-03 (×2): 100 mg via ORAL
  Filled 2022-01-01 (×2): qty 1

## 2022-01-01 MED ORDER — ACETAMINOPHEN 500 MG PO TABS: 1000.0000 mg | ORAL_TABLET | Freq: Four times a day (QID) | ORAL | Status: DC | PRN

## 2022-01-01 MED ORDER — METOPROLOL TARTRATE 12.5 MG HALF TABLET
12.5000 mg | ORAL_TABLET | Freq: Two times a day (BID) | ORAL | Status: DC
  Administered 2022-01-01 – 2022-01-03 (×5): 12.5 mg via ORAL
  Filled 2022-01-01 (×6): qty 1

## 2022-01-01 MED ORDER — ASPIRIN EC 81 MG PO TBEC
81.0000 mg | DELAYED_RELEASE_TABLET | Freq: Every morning | ORAL | Status: DC
  Administered 2022-01-01 – 2022-01-03 (×3): 81 mg via ORAL
  Filled 2022-01-01 (×3): qty 1

## 2022-01-01 MED ORDER — OXYCODONE HCL 5 MG PO TABS: 5.0000 mg | ORAL_TABLET | Freq: Four times a day (QID) | ORAL | Status: DC | PRN

## 2022-01-01 MED ORDER — CLOPIDOGREL BISULFATE 75 MG PO TABS
75.0000 mg | ORAL_TABLET | Freq: Every day | ORAL | Status: DC
  Administered 2022-01-01 – 2022-01-03 (×3): 75 mg via ORAL
  Filled 2022-01-01 (×3): qty 1

## 2022-01-01 MED ORDER — OXYCODONE HCL 5 MG PO CAPS: 5.0000 mg | ORAL_CAPSULE | Freq: Four times a day (QID) | ORAL | 0 refills | Status: DC | PRN

## 2022-01-01 MED FILL — Midazolam HCl Inj 5 MG/5ML (Base Equivalent): INTRAMUSCULAR | Qty: 1 | Status: AC

## 2022-01-01 NOTE — [place] FL2 (Signed)
[place] MEDICAID FL2 LEVEL OF CARE SCREENING TOOL     IDENTIFICATION  Patient Name: Lonnie [name] Birthdate: [birth year] Sex: male Admission Date (Current Location): 12/31/2021  [facility] [place]   Facility and Address:  [facility] [place], [address], [place], [place] [address]      Provider Number: [id number]  Attending Physician Name and Address:  Nada LibmanBrabham, Vance W, MD  Relative Name and Phone Number:  Backlund-Daniel,[name] Daughter   [phone]    Current Level of Care: Hospital Recommended Level of Care: Skilled Nursing Facility Prior Approval Number:    Date Approved/Denied:   PASRR Number:    Discharge Plan: SNF    Current Diagnoses: Patient Active Problem List   Diagnosis Date Noted   Infected prosthetic vascular graft (HCC)    Bacteremia due to Pseudomonas    Status post above-knee amputation of right lower extremity (HCC)    AKI (acute kidney injury) (HCC) 06/15/2021   Ischemic cardiomyopathy    Sepsis secondary to UTI (HCC) 09/10/2020   Acute metabolic encephalopathy 09/10/2020   Closed fracture of right patella 09/10/2020   PAD (peripheral artery disease) (HCC) 01/20/2020   Fronto-temporal dementia (HCC) 08/04/2019   Left shoulder pain    TBI (traumatic brain injury)    E-coli UTI    Urinary retention    Postoperative pain    Agitation    SDH (subdural hematoma) 07/08/2019   Subdural hematoma 06/30/2019   Erectile dysfunction 07/28/2018   Peripheral vascular disease (HCC) 07/16/2017   Anxiety and depression 07/16/2017   PCP NOTES >>>>>>>>>>>> 07/08/2016   Dermatitis 04/19/2015   Elevated LFTs 02/12/2015   Hyperglycemia 09/01/2014   DJD (degenerative joint disease) 09/01/2014   Annual physical exam 09/01/2014   S/P CABG x 4 11/07/2013   HTN (hypertension) 10/29/2013   Tobacco abuse 10/29/2013   Coronary atherosclerosis of native coronary artery 10/29/2013    Orientation RESPIRATION  BLADDER Height & Weight     Self, Time, Situation, Place  Normal (see discharge summary) Continent Weight: 190 lb (86.2 kg) Height:  5\' 11"  (180.3 cm)  BEHAVIORAL SYMPTOMS/MOOD NEUROLOGICAL BOWEL NUTRITION STATUS      Continent Diet  AMBULATORY STATUS COMMUNICATION OF NEEDS Skin   Total Care Verbally Skin abrasions                       Personal Care Assistance Level of Assistance  Bathing, Feeding, Dressing Bathing Assistance: Limited assistance Feeding assistance: Limited assistance Dressing Assistance: Limited assistance     Functional Limitations Info  Sight, Hearing, Speech Sight Info: Adequate Hearing Info: Adequate Speech Info: Adequate    SPECIAL CARE FACTORS FREQUENCY  PT (By licensed PT), OT (By licensed OT)     PT Frequency: 5x week OT Frequency: 5x week            Contractures Contractures Info: Not present    Additional Factors Info  Code Status, Allergies Code Status Info: full Allergies Info: Ace Inhibitors, Eggs Or Egg-derived Products, Lactose Intolerance (Gi), Latex           Current Medications (01/01/2022):  This is the current hospital active medication list Current Facility-Administered Medications  Medication Dose Route Frequency Provider Last Rate Last Admin   0.9 %  sodium chloride infusion  250 mL Intravenous PRN Nada LibmanBrabham, Vance W, MD       acetaminophen (TYLENOL) tablet 1,000 mg  1,000 mg Oral Q6H PRN Nada LibmanBrabham, Vance W, MD  acetaminophen (TYLENOL) tablet 650 mg  650 mg Oral Q4H PRN Nada Libman, MD       aspirin EC tablet 81 mg  81 mg Oral q AM Nada Libman, MD   81 mg at 01/01/22 0935   atorvastatin (LIPITOR) tablet 80 mg  80 mg Oral Daily Nada Libman, MD   80 mg at 01/01/22 0930   calcium-vitamin D (OSCAL WITH D) 500-5 MG-MCG per tablet 1 tablet  1 tablet Oral Q breakfast Nada Libman, MD   1 tablet at 01/01/22 0930   Chlorhexidine Gluconate Cloth 2 % PADS 6 each  6 each Topical Daily Nada Libman, MD   6  each at 12/31/21 2114   citalopram (CELEXA) tablet 10 mg  10 mg Oral BID Nada Libman, MD   10 mg at 01/01/22 0930   clonazePAM (KLONOPIN) disintegrating tablet 0.25 mg  0.25 mg Oral BID Nada Libman, MD   0.25 mg at 01/01/22 0931   clopidogrel (PLAVIX) tablet 75 mg  75 mg Oral Daily Nada Libman, MD   75 mg at 01/01/22 0931   docusate sodium (COLACE) capsule 100 mg  100 mg Oral q AM Nada Libman, MD       Ferrous Fumarate (HEMOCYTE - 106 mg FE) tablet 106 mg of iron  1 tablet Oral Daily Nada Libman, MD       gabapentin (NEURONTIN) capsule 100 mg  100 mg Oral BID Nada Libman, MD   100 mg at 01/01/22 0930   hydrALAZINE (APRESOLINE) injection 5 mg  5 mg Intravenous Q20 Min PRN Nada Libman, MD       irbesartan (AVAPRO) tablet 300 mg  300 mg Oral Daily Nada Libman, MD   300 mg at 01/01/22 0930   labetalol (NORMODYNE) injection 10 mg  10 mg Intravenous Q10 min PRN Nada Libman, MD       melatonin tablet 3 mg  3 mg Oral QHS Nada Libman, MD       metoprolol tartrate (LOPRESSOR) tablet 12.5 mg  12.5 mg Oral BID Nada Libman, MD   12.5 mg at 01/01/22 0930   multivitamin with minerals tablet 1 tablet  1 tablet Oral q AM Nada Libman, MD   1 tablet at 01/01/22 0935   ondansetron (ZOFRAN) injection 4 mg  4 mg Intravenous Q6H PRN Nada Libman, MD       oxyCODONE (Oxy IR/ROXICODONE) immediate release tablet 5 mg  5 mg Oral Q6H PRN Nada Libman, MD       pantoprazole (PROTONIX) EC tablet 40 mg  40 mg Oral QHS Nada Libman, MD       QUEtiapine (SEROQUEL) tablet 200 mg  200 mg Oral BID Nada Libman, MD   200 mg at 01/01/22 0931   sodium chloride flush (NS) 0.9 % injection 3 mL  3 mL Intravenous Q12H Nada Libman, MD   3 mL at 12/31/21 2115   sodium chloride flush (NS) 0.9 % injection 3 mL  3 mL Intravenous PRN Nada Libman, MD       tamsulosin (FLOMAX) capsule 0.4 mg  0.4 mg Oral QPC supper Nada Libman, MD         Discharge  Medications: Please see discharge summary for a list of discharge medications.  Relevant Imaging Results:  Relevant Lab Results:   Additional Information ss#[id number].  Patient vaccinated for COVID: 02/16/20, 03/13/20, 11/04/20  PFIZER Comrnaty(Gray TOP) Covid-19 Vaccine 06/25/2021  Lorri [place], LCSW

## 2022-01-01 NOTE — Plan of Care (Signed)

## 2022-01-01 NOTE — Progress Notes (Addendum)
[facility]  Subjective  -  No complaints.   Assessment/Planning: POD #1 angiogram with diagnostic left LE runoff  Angiogram revealed: Left Lower Extremity: Left common femoral profundofemoral artery are widely patent.  The superficial femoral artery occludes at the level of the previously placed stents in the adductor canal.  There is reconstitution of the above-knee popliteal artery.  The dominant runoff is the peroneal artery which collateralizes to the posterior tibial at the ankle.  The anterior tibial is very diminutive.  He had a left anterior shin non healing wound this appears to be healing.  He has a previous right AKA and is not ambulatory.  As long as the wound continues to fully heal he can continue activity as tolerates.  Maximum medical management with aspirin, Plavix and statin.    He will be discharged today with a follow up appointment in in 2-3 weeks for wound check.  If the wound fails to heal or he develops a new non healing wound he will require left femoral to below-knee popliteal bypass graft.   Objective (!) 169/99 65 97.8 F (36.6 C) (Oral) 15 99%  Intake/Output Summary (Last 24 hours) at 01/01/2022 0728 Last data filed at 01/01/2022 0548 Gross per 24 hour  Intake 1018.67 ml  Output 1500 ml  Net -481.33 ml    Left anterior shin wound healing Doppler peroneal signal left LE Right stump warm and well perfused Lungs non labored breathing    Roxy Horseman 01/01/2022 7:28 AM --  Laboratory Lab Results: Recent Labs    12/31/21 0620  HGB 12.6*  HCT 37.0*   BMET Recent Labs    12/31/21 0620  NA 141  K 4.2  CL 104  GLUCOSE 107*  BUN 24*  CREATININE 1.20    COAG Lab Results  Component Value Date   INR 1.3 (H) 07/04/2021   INR 1.1 06/27/2021   INR 1.1 09/10/2020   No results found for: PTT

## 2022-01-01 NOTE — Evaluation (Signed)
Occupational Therapy Evaluation Patient Details Name: Lonnie [name] MRN: [id number] DOB: Jul 26, 1951 Today's Date: 01/01/2022   History of Present Illness Pt is a 71 y.o. male admitted 12/31/21 with LLE ulcer, s/p abdominal aortogram with LLE runoff same day. PMH includes PAD s/p R AKA (06/2021), HTN, HLD, BPH, GERD, stroke, SDH, dementia, anxiety, depression.   Clinical Impression   Patient is s/p abdominal aortogram surgery and LLE ulcer resulting in functional limitations due to the deficits listed below (see OT problem list). Daughter requesting SNF placement due to family (A) level for patient. Pt is able to make basic transfer with squat pivot +1 (A) to w/c or drop arm chair.  Patient will benefit from skilled OT acutely to increase independence and safety with ADLS to allow discharge SNF.       Recommendations for follow up therapy are one component of a multi-disciplinary discharge planning process, led by the attending physician.  Recommendations may be updated based on patient status, additional functional criteria and insurance authorization.   Follow Up Recommendations  Skilled nursing-short term rehab (<3 hours/day)    Assistance Recommended at Discharge PRN  Patient can return home with the following Assistance with cooking/housework;Assist for transportation    Functional Status Assessment  Patient has had a recent decline in their functional status and demonstrates the ability to make significant improvements in function in a reasonable and predictable amount of time.  Equipment Recommendations  None recommended by OT    Recommendations for Other Services       Precautions / Restrictions Precautions Precautions: Fall;Other (comment) Precaution Comments: h/o R AKA (does not have prosthetic in room) Restrictions Weight Bearing Restrictions: Yes RLE Weight Bearing: Non weight bearing Other Position/Activity Restrictions: needs prosthetic and not present at this  time      Mobility Bed Mobility Overal bed mobility: Independent                  Transfers Overall transfer level: Needs assistance Equipment used: None Transfers: Bed to chair/wheelchair/BSC Sit to Stand: Min guard   Squat pivot transfers: Supervision       General transfer comment: Assist for set-up, supervision for safety with squat pivot from bed to recliner towards L-side; additional partial stand from recliner to don depends with min guard      Balance Overall balance assessment: Needs assistance   Sitting balance-Leahy Scale: Poor                                     ADL either performed or assessed with clinical judgement   ADL Overall ADL's : Needs assistance/impaired Eating/Feeding: Set up   Grooming: Wash/dry face;Oral care;Minimal assistance;Sitting Grooming Details (indicate cue type and reason): sink level in recliner Upper Body Bathing: Minimal assistance   Lower Body Bathing: Moderate assistance       Lower Body Dressing: Moderate assistance;Sit to/from stand (don brief)   Toilet Transfer: Minimal assistance;BSC/3in1;Requires drop arm             General ADL Comments: pt transfers from bed to drop arm recliner then sink level grooming task. pt able to don brief with sit<>STand to have therapy pull up the posterior aspect of brief     Vision Baseline Vision/History: 1 Wears glasses (reading only)       Perception     Praxis      Pertinent Vitals/Pain Pain Assessment: No/denies pain  Hand Dominance Right   Extremity/Trunk Assessment Upper Extremity Assessment Upper Extremity Assessment: Overall WFL for tasks assessed   Lower Extremity Assessment Lower Extremity Assessment: RLE deficits/detail RLE Deficits / Details: h/o R AKA   Cervical / Trunk Assessment Cervical / Trunk Assessment: Normal   Communication Communication Communication: No difficulties   Cognition Arousal/Alertness:  Awake/alert Behavior During Therapy: WFL for tasks assessed/performed Overall Cognitive Status: No family/caregiver present to determine baseline cognitive functioning Area of Impairment: Memory                     Memory: Decreased short-term memory         General Comments: Per chart, h/o dementia. Pt pleasant and WFL for majority of simple tasks. Suspect some memory deficits (repetitive on need to call wife), difficulty recalling details of past stories and some details regarding PLOF     General Comments  VSS,    Exercises     Shoulder Instructions      Home Living Family/patient expects to be discharged to:: Private residence Living Arrangements: Spouse/significant other   Type of Home: House Home Access: Level entry     [place]: One level     Bathroom Shower/Tub: Occupational psychologist: Handicapped height Bathroom Accessibility: No   Home Equipment: Conservation officer, nature (2 wheels);Shower seat;Hand held shower head;Grab bars - tub/shower;Wheelchair - manual   Additional Comments: PCA 3x/wk 1-3:30PM per pt      Prior Functioning/Environment Prior Level of Function : Needs assist             Mobility Comments: Typically mobile at w/c-level with stand pivot transfers; not consistently ambulatory with R AKA prosthetic ADLs Comments: PCA assists with bathing, household tasks (including laundry)        OT Problem List: Decreased activity tolerance;Decreased knowledge of use of DME or AE;Decreased knowledge of precautions;Decreased cognition      OT Treatment/Interventions: Self-care/ADL training;Therapeutic exercise;Energy conservation;Manual therapy;DME and/or AE instruction;Therapeutic activities;Cognitive remediation/compensation;Patient/family education;Balance training    OT Goals(Current goals can be found in the care plan section) Acute Rehab OT Goals Patient Stated Goal: pt wants to d/c home with wife. OT Goal Formulation: Patient  unable to participate in goal setting Time For Goal Achievement: 01/15/22 Potential to Achieve Goals: Good  OT Frequency: Min 2X/week    Co-evaluation              AM-PAC OT "6 Clicks" Daily Activity     Outcome Measure Help from another person eating meals?: A Little Help from another person taking care of personal grooming?: A Little Help from another person toileting, which includes using toliet, bedpan, or urinal?: A Lot Help from another person bathing (including washing, rinsing, drying)?: A Lot Help from another person to put on and taking off regular upper body clothing?: A Lot Help from another person to put on and taking off regular lower body clothing?: A Lot 6 Click Score: 14   End of Session Nurse Communication: Mobility status;Precautions  Activity Tolerance: Patient tolerated treatment well Patient left: in chair;with call bell/phone within reach;with chair alarm set (notified unit of need for green box but all setup in place)  OT Visit Diagnosis: Unsteadiness on feet (R26.81);Muscle weakness (generalized) (M62.81)                Time: [id number] OT Time Calculation (min): 20 min Charges:  OT General Charges $OT Visit: 1 Visit OT Evaluation $OT Eval Moderate Complexity: 1 Mod   Brynn, OTR/L  Acute Rehabilitation Services Pager: [phone] Office: [phone] .   Jeri Modena 01/01/2022, 8:47 AM

## 2022-01-01 NOTE — TOC Initial Note (Signed)
Transition of Care [facility]) - Initial/Assessment Note    Patient Details  Name: Lonnie [name] MRN: [id number] Date of Birth: 1951-06-12  Transition of Care [facility] CM/SW Contact:    Tresa Endo Phone Number: 01/01/2022, 5:20 PM  Clinical Narrative:                 CSW received SNF consult. CSW met with pt daughter, CSW introduced self and explained role at the hospital. Pt reports that PTA the lived at home with his spouse. PT reports pt needs assistance with ADLs. PT reports pt has a prosthetic but typically mobile with wheelchair. Pt has a click score of 14.   CSW reviewed PT/OT recommendations for SNF. Pt daughter reports that she and her mother both have heart problems and both have pacemakers, they cannot provide the assistance pt needs without some therapy. Pt does have an aid that comes 4 days a week. Pt doe not mind going to the SNF he is just ready to leave the hospital ASAP.Pt gave CSW permission to fax out to facilities in the area. Pt daughter requested [facility] [place] reached out to [place] [facility]. Juliann Pulse will review pt but states she does have a bed for tomorrow.  CSW will continue to follow.    Expected Discharge Plan: Skilled Nursing Facility Barriers to Discharge: Continued Medical Work up   Patient Goals and CMS Choice Patient states their goals for this hospitalization and ongoing recovery are:: Rehab CMS Medicare.gov Compare Post Acute Care list provided to:: Patient Choice offered to / list presented to : Patient  Expected Discharge Plan and Services Expected Discharge Plan: [place] In-house Referral: Clinical Social Work   Post Acute Care Choice: [place] Living arrangements for the past 2 months: [place] Expected Discharge Date: 01/01/22                                    Prior Living Arrangements/Services Living arrangements for the past 2 months: Single Family Home Lives with:: Adult Children,  Spouse Patient language and need for interpreter reviewed:: Yes Do you feel safe going back to the place where you live?: Yes      Need for Family Participation in Patient Care: Yes (Comment) Care giver support system in place?: Yes (comment)   Criminal Activity/Legal Involvement Pertinent to Current Situation/Hospitalization: No - Comment as needed  Activities of Daily Living      Permission Sought/Granted Permission sought to share information with : Facility Sport and exercise psychologist, Family Supports Permission granted to share information with : Yes, Verbal Permission Granted  Share Information with NAME: Manish, Ruggiero (Daughter)   [phone]  Permission granted to share info w AGENCY: SNF  Permission granted to share info w Relationship: Bonfield-Daniel,[name] (Daughter)   [phone]  Permission granted to share info w Contact Information: Vedansh, Kerstetter (Daughter)   [phone]  Emotional Assessment Appearance:: Appears stated age Attitude/Demeanor/Rapport: Unable to Assess Affect (typically observed): Unable to Assess Orientation: : Oriented to Self, Oriented to Place, Oriented to  Time, Oriented to Situation Alcohol / Substance Use: Not Applicable Psych Involvement: No (comment)  Admission diagnosis:  PAD (peripheral artery disease) ([place]) [I73.9] Patient Active Problem List   Diagnosis Date Noted   Infected prosthetic vascular graft ([place])    Bacteremia due to Pseudomonas    Status post above-knee amputation of right lower extremity ([place])    AKI (acute kidney injury) ([place]) 06/15/2021  Ischemic cardiomyopathy    Sepsis secondary to UTI ([place]) 95/18/8416   Acute metabolic encephalopathy 60/63/0160   Closed fracture of right patella 09/10/2020   PAD (peripheral artery disease) ([place]) 01/20/2020   Fronto-temporal dementia ([place]) 08/04/2019   Left shoulder pain    TBI (traumatic brain injury)    E-coli UTI    Urinary retention    Postoperative  pain    Agitation    SDH (subdural hematoma) 07/08/2019   Subdural hematoma 06/30/2019   Erectile dysfunction 07/28/2018   Peripheral vascular disease ([place]) 07/16/2017   Anxiety and depression 07/16/2017   PCP NOTES >>>>>>>>>>>> 07/08/2016   Dermatitis 04/19/2015   Elevated LFTs 02/12/2015   Hyperglycemia 09/01/2014   DJD (degenerative joint disease) 09/01/2014   Annual physical exam 09/01/2014   S/P CABG x 4 11/07/2013   HTN (hypertension) 10/29/2013   Tobacco abuse 10/29/2013   Coronary atherosclerosis of native coronary artery 10/29/2013   PCP:  Colon Branch, MD Pharmacy:   [facility] [place], [place] [facility] [address] [place] [address] [place] [address] Phone: [phone] Fax: [phone]  [id number] - [address], [address] AT [facility] OF [place] [address] Phone: [phone] Fax: [phone]  [facility] ([facility] ) - [place], [place] [address] Phone: [phone] Fax: [phone]     Social Determinants of Health (SDOH) Interventions    Readmission Risk Interventions Readmission Risk Prevention Plan 06/14/2021 09/12/2020 03/12/2020  Post Dischage Appt Complete - -  Medication Screening Complete - -  Transportation Screening Complete Complete -  PCP or Specialist Appt within 3-5 Days - Complete -  HRI or [place] - Complete -  Social Work Consult for Recovery Care Planning/Counseling - Complete -  Palliative Care Screening - Not Applicable -  Medication Review (RN Care Manager) - Complete Complete  Some recent data might be hidden

## 2022-01-01 NOTE — Evaluation (Signed)
Physical Therapy Evaluation Patient Details Name: Lonnie [name] MRN: [id number] DOB: 1951-11-14 Today's Date: 01/01/2022  History of Present Illness  Pt is a 71 y.o. male admitted 12/31/21 with LLE ulcer, s/p abdominal aortogram with LLE runoff same day. PMH includes PAD s/p R AKA (06/2021), HTN, HLD, BPH, GERD, stroke, SDH, dementia, anxiety, depression.   Clinical Impression  Pt presents with an overall decrease in functional mobility secondary to above. PTA, pt lives with wife, mobilizes at w/c-level majority of time with intermittent use of RLE prosthetic for ambulation. Today, pt able to perform squat pivot transfers with assist +1. Family requesting SNF-level therapies due to decreased assist available that pt is currently requiring. Will follow acutely to maximize functional mobility and independence.    Recommendations for follow up therapy are one component of a multi-disciplinary discharge planning process, led by the attending physician.  Recommendations may be updated based on patient status, additional functional criteria and insurance authorization.  Follow Up Recommendations Skilled nursing-short term rehab (<3 hours/day)    Assistance Recommended at Discharge PRN  Patient can return home with the following  A little help with walking and/or transfers;A little help with bathing/dressing/bathroom;Assistance with cooking/housework;Assist for transportation;Help with stairs or ramp for entrance    Equipment Recommendations None recommended by PT  Recommendations for Other Services       Functional Status Assessment Patient has had a recent decline in their functional status and demonstrates the ability to make significant improvements in function in a reasonable and predictable amount of time.     Precautions / Restrictions Precautions Precautions: Fall;Other (comment) Precaution Comments: h/o R AKA (does not have prosthetic in room) Restrictions Weight Bearing  Restrictions: Yes RLE Weight Bearing: Non weight bearing Other Position/Activity Restrictions: needs prosthetic and not present at this time      Mobility  Bed Mobility Overal bed mobility: Independent                  Transfers Overall transfer level: Needs assistance Equipment used: None Transfers: Bed to chair/wheelchair/BSC Sit to Stand: Min guard     Squat pivot transfers: Supervision;Min guard     General transfer comment: Assist for set-up, supervision for safety with squat pivot from bed to recliner towards L-side; additional partial stand from recliner to don depends with min guard    Ambulation/Gait                  Stairs            Wheelchair Mobility    Modified Rankin (Stroke Patients Only)       Balance Overall balance assessment: Needs assistance   Sitting balance-Leahy Scale: Fair Sitting balance - Comments: able to don sock and brief on LLE sitting EOB and recliner                                     Pertinent Vitals/Pain Pain Assessment: No/denies pain    Home Living Family/patient expects to be discharged to:: Private residence Living Arrangements: Spouse/significant other   Type of Home: House Home Access: Level entry       Home Layout: One level Home Equipment: Agricultural consultant (2 wheels);Shower seat;Hand held shower head;Grab bars - tub/shower;Wheelchair - manual Additional Comments: PCA 3x/wk 1-3:30PM per pt    Prior Function Prior Level of Function : Needs assist  Mobility Comments: Typically mobile at w/c-level with stand pivot transfers; not consistently ambulatory with R AKA prosthetic ADLs Comments: PCA assists with bathing, household tasks (including laundry)     Hand Dominance   Dominant Hand: Right    Extremity/Trunk Assessment   Upper Extremity Assessment Upper Extremity Assessment: Overall WFL for tasks assessed    Lower Extremity Assessment Lower Extremity  Assessment: RLE deficits/detail RLE Deficits / Details: h/o R AKA    Cervical / Trunk Assessment Cervical / Trunk Assessment: Normal  Communication   Communication: No difficulties  Cognition Arousal/Alertness: Awake/alert Behavior During Therapy: WFL for tasks assessed/performed Overall Cognitive Status: No family/caregiver present to determine baseline cognitive functioning Area of Impairment: Memory                     Memory: Decreased short-term memory         General Comments: Per chart, h/o dementia. Pt pleasant and WFL for majority of simple tasks. Suspect some memory deficits (repetitive on need to call wife), difficulty recalling details of past stories and some details regarding PLOF        General Comments General comments (skin integrity, edema, etc.): VSS on RA; daughter requesting SNF placement    Exercises     Assessment/Plan    PT Assessment Patient needs continued PT services  PT Problem List Decreased activity tolerance;Decreased balance;Decreased mobility;Decreased cognition       PT Treatment Interventions DME instruction;Gait training;Functional mobility training;Therapeutic activities;Therapeutic exercise;Balance training;Patient/family education;Wheelchair mobility training    PT Goals (Current goals can be found in the Care Plan section)  Acute Rehab PT Goals Patient Stated Goal: pt wants to return home, family requesting short-term post acute rehab at SNF PT Goal Formulation: With patient/family Time For Goal Achievement: 01/15/22 Potential to Achieve Goals: Good    Frequency Min 2X/week     Co-evaluation               AM-PAC PT "6 Clicks" Mobility  Outcome Measure Help needed turning from your back to your side while in a flat bed without using bedrails?: None Help needed moving from lying on your back to sitting on the side of a flat bed without using bedrails?: None Help needed moving to and from a bed to a chair  (including a wheelchair)?: A Little Help needed standing up from a chair using your arms (e.g., wheelchair or bedside chair)?: Total Help needed to walk in hospital room?: Total Help needed climbing 3-5 steps with a railing? : Total 6 Click Score: 14    End of Session   Activity Tolerance: Patient tolerated treatment well Patient left: in chair;with call bell/phone within reach Nurse Communication: Mobility status;Other (comment) (NT aware needs chair alarm box) PT Visit Diagnosis: Other abnormalities of gait and mobility (R26.89)    Time: 3220-2542 PT Time Calculation (min) (ACUTE ONLY): 22 min   Charges:   PT Evaluation $PT Eval Moderate Complexity: 1 Mod        Ina Homes, PT, DPT Acute Rehabilitation Services  Pager [phone] Office [phone]  Malachy Chamber 01/01/2022, 9:30 AM

## 2022-01-01 NOTE — Progress Notes (Addendum)
RE:   Lonnie [name]      Date of Birth: 2051/07/03      Date:   01/01/22       To Whom It May Concern:  Please be advised that the above-named patient will require a short-term nursing home stay - anticipated 30 days or less for rehabilitation and strengthening.  The plan is for return home.            Annamarie Major     MD signature           01-02-2022     Date

## 2022-01-01 NOTE — Discharge Summary (Signed)
[facility] Discharge Summary   Patient ID:  Lonnie [name] Cornia MRN: [id number] DOB/AGE: 69-Apr-1952 69 y.o.  Admit date: 12/31/2021 Discharge date: 01/03/22 Date of Surgery: 12/31/2021 Surgeon: Surgeon(s): Nada LibmanBrabham, Vance W, MD  Admission Diagnosis: PAD (peripheral artery disease) (HCC) [I73.9]  Discharge Diagnoses:  PAD (peripheral artery disease) (HCC) [I73.9]  Secondary Diagnoses: Past Medical History:  Diagnosis Date   Allergy    Anxiety    Arthritis    left neck, shoulder, knee   CAD (coronary artery disease)    a. anterior STEMI 10/2013 s/p 4V CABG with LIMA to mid LAD, SVG to OM, SVG to PDA, SVG to Diagonal.   Chronic combined systolic and diastolic CHF (congestive heart failure) (HCC)    Dementia (HCC)    Depression    Falls    Hypertension    Ischemic cardiomyopathy    a. EF 40-45% at time of CABG and in 2018.   MI (myocardial infarction) (HCC)    Peripheral neuropathy    Prediabetes 09/01/2014   PVD (peripheral vascular disease) (HCC)    a. s/p L SFA stents with now known bilateral SFA. b. right femoral to below the knee bypass by Dr. Myra GianottiBrabham in 12/2019, c/b infection 02/2020.   Stroke [facility](HCC)    seen on CT Scan   Subdural hematoma    Tobacco abuse    UTI (urinary tract infection)     Procedure(s): ABDOMINAL AORTOGRAM W/LOWER EXTREMITY  Discharged Condition: stable  HPI: Lonnie [name] Spanier is a 71 y.o. male history of previous left lower extremity endovascular right lower extremity bypass that resulted in amputation.  Presented to the office with a new left anterior shin wound.  The wound had been present for several weeks with evidence of slow healing.  He is non ambulatory and his main mobility is a WC.  He is on aspirin, Plavix and statin.  His studies at the office revealed a mildly decreased ABI and likely occluded mid SFA where previously he had intervention in the thigh with stenting.  He was scheduled for  repeat angiography     Hospital Course:  Lonnie [name] Grosch is a 71 y.o. male is S/P  Procedure(s): ABDOMINAL AORTOGRAM W/LOWER EXTREMITY Angiogram revealed: Left Lower Extremity: Left common femoral profundofemoral artery are widely patent.  The superficial femoral artery occludes at the level of the previously placed stents in the adductor canal.  There is reconstitution of the above-knee popliteal artery.  The dominant runoff is the peroneal artery which collateralizes to the posterior tibial at the ankle.  The anterior tibial is very diminutive.   He had a left anterior shin non healing wound this appears to be healing.  He has a previous right AKA and is not ambulatory.  As long as the wound continues to fully heal he can continue activity as tolerates.  Maximum medical management with aspirin, Plavix and statin.               He will be discharged today with a follow up appointment in in 2-3 weeks for wound check.  If the wound fails to heal or he develops a new non healing wound he will require left femoral to below-knee popliteal bypass graft.    Significant Diagnostic Studies: CBC Lab Results  Component Value Date   WBC 9.3 08/13/2021   HGB 12.6 (L) 12/31/2021   HCT 37.0 (L) 12/31/2021   MCV 75.9 (L) 08/13/2021   PLT 346.0 08/13/2021    BMET  Component Value Date/Time   NA 141 12/31/2021 0620   NA 142 01/10/2020 1146   K 4.2 12/31/2021 0620   CL 104 12/31/2021 0620   CO2 26 10/10/2021 1130   GLUCOSE 107 (H) 12/31/2021 0620   BUN 24 (H) 12/31/2021 0620   BUN 19 01/10/2020 1146   CREATININE 1.20 12/31/2021 0620   CREATININE 0.94 05/24/2021 1549   CALCIUM 9.7 10/10/2021 1130   GFRNONAA >60 07/10/2021 0500   GFRAA >60 09/12/2020 0357   COAG Lab Results  Component Value Date   INR 1.3 (H) 07/04/2021   INR 1.1 06/27/2021   INR 1.1 09/10/2020     Disposition:  Discharge to :Skilled nursing facility Discharge Instructions     Call MD for:  redness, tenderness, or signs of  infection (pain, swelling, bleeding, redness, odor or green/yellow discharge around incision site)   Complete by: As directed    Call MD for:  redness, tenderness, or signs of infection (pain, swelling, bleeding, redness, odor or green/yellow discharge around incision site)   Complete by: As directed    Call MD for:  severe or increased pain, loss or decreased feeling  in affected limb(s)   Complete by: As directed    Call MD for:  severe or increased pain, loss or decreased feeling  in affected limb(s)   Complete by: As directed    Call MD for:  temperature >100.5   Complete by: As directed    Call MD for:  temperature >100.5   Complete by: As directed    Discharge instructions   Complete by: As directed    May remove right groin dressing and shower.   Resume previous diet   Complete by: As directed    Resume previous diet   Complete by: As directed       Allergies as of 01/03/2022       Reactions   Ace Inhibitors Swelling, Other (See Comments)   Angioedema   Eggs Or Egg-derived Products Nausea Only, Other (See Comments)   Cannot eat prepared eggs- Occasional nausea   Lactose Intolerance (gi) Diarrhea, Nausea Only, Other (See Comments)   Flatulence, also   Latex Itching        Medication List     STOP taking these medications    oxyCODONE 5 MG immediate release tablet Commonly known as: Roxicodone Replaced by: oxycodone 5 MG capsule       TAKE these medications    acetaminophen 500 MG tablet Commonly known as: TYLENOL Take 1,000 mg by mouth in the morning and at bedtime.   aspirin EC 81 MG tablet Take 81 mg by mouth in the morning.   atorvastatin 80 MG tablet Commonly known as: LIPITOR TAKE 1 TABLET BY MOUTH  DAILY   CALCIUM 600+D3 PO Take 1 tablet by mouth daily with breakfast.   citalopram 20 MG tablet Commonly known as: CELEXA TAKE ONE-HALF TABLET BY  MOUTH TWICE DAILY What changed: how much to take   clonazePAM 0.25 MG disintegrating  tablet Commonly known as: KLONOPIN DISSOLVE 1 TABLET ON THE  TONGUE TWICE DAILY AS  NEEDED What changed: See the new instructions.   clopidogrel 75 MG tablet Commonly known as: PLAVIX TAKE 1 TABLET BY MOUTH  DAILY What changed: when to take this   docusate sodium 100 MG capsule Commonly known as: COLACE Take 100 mg by mouth in the morning.   Ferrous Fumarate 324 (106 Fe) MG Tabs tablet Commonly known as: HEMOCYTE - 106 mg FE Take  1 tablet (106 mg of iron total) by mouth daily.   furosemide 20 MG tablet Commonly known as: LASIX TAKE 1 TABLET BY MOUTH DAILY AS NEEDED FOR ANKLE SWELLING What changed: See the new instructions.   gabapentin 100 MG capsule Commonly known as: NEURONTIN TAKE 1 CAPSULE BY MOUTH  TWICE DAILY   irbesartan 300 MG tablet Commonly known as: AVAPRO TAKE 1 TABLET BY MOUTH  DAILY   melatonin 3 MG Tabs tablet Take 2 mg by mouth at bedtime.   metoprolol tartrate 25 MG tablet Commonly known as: LOPRESSOR Take 0.5 tablets (12.5 mg total) by mouth 2 (two) times daily. What changed: how much to take   Moderna COVID-19 Bival Booster 50 MCG/0.5ML injection Generic drug: COVID-19 mRNA bivalent vaccine (Moderna) Inject into the muscle.   multivitamin with minerals Tabs tablet Take 1 tablet by mouth in the morning.   nicotine 7 mg/24hr patch Commonly known as: NICODERM CQ - dosed in mg/24 hr Place 1 patch (7 mg total) onto the skin daily as needed.   oxycodone 5 MG capsule Commonly known as: OXY-IR Take 1 capsule (5 mg total) by mouth every 6 (six) hours as needed. Replaces: oxyCODONE 5 MG immediate release tablet   PAIN RELIEVING PATCH ULTRA ST EX Apply 1 patch topically daily as needed (pain).   pantoprazole 40 MG tablet Commonly known as: PROTONIX Take 1 tablet (40 mg total) by mouth at bedtime.   QUEtiapine 200 MG tablet Commonly known as: SEROQUEL TAKE 1 TABLET BY MOUTH  TWICE DAILY   tamsulosin 0.4 MG Caps capsule Commonly known as:  FLOMAX TAKE 2 CAPSULES BY MOUTH  DAILY AFTER SUPPER       Verbal and written Discharge instructions given to the patient. Wound care per Discharge AVS  Follow-up Information     Nada Libman, MD Follow up in 2 week(s).   Specialties: Vascular Surgery, Cardiology Why: Office will call you to arrange your appt (sent) Contact information: [address] [place] [address] [phone]                 Signed: Mosetta Pigeon 01/03/2022, 1:48 PM

## 2022-01-01 NOTE — Care Management Obs Status (Signed)
MEDICARE OBSERVATION STATUS NOTIFICATION   Patient Details  Name: Lonnie [name] MRN: [id number] Date of Birth: [birth year]   Medicare Observation Status Notification Given:  Yes    Lawerance Sabal, RN 01/01/2022, 11:24 AM

## 2022-01-01 NOTE — Progress Notes (Signed)
This RN was at nurses station when I noticed that patient was sitting on the floor.   Patient was transferred from bed to chair by physical therapy this morning around 930am. Patient did have chair alarm pad in the chair but there was no chair alarm box connected. No chair alarm box available in the room, this RN found a chair alarm box and put in the room.  Patient was was picked up from the floor by RN and Nurse Tech and put into bed.   Patient stated that he was trying to transfer himself from the chair to the bed when fell on his buttocks. Patient stated "I didn't want to bother anybody".  Patient A&O x4 answered all questions appropriately. Patient was not injured and had no complaints of pain.  Clinton Gallant, PA was notified as well as patients wife was also notified by this RN.

## 2022-01-02 ENCOUNTER — Encounter (HOSPITAL_COMMUNITY): Payer: Self-pay | Admitting: Surgery

## 2022-01-02 NOTE — Progress Notes (Addendum)
[facility] [place]  Subjective  - No new complaints.  He states his table moved and he slip out of his chair onto the floor.  He was assessed  by The RN no injury was idenified.   Objective (!) 148/78 64 (!) 97.5 F (36.4 C) (Oral) 15 99%  Intake/Output Summary (Last 24 hours) at 01/02/2022 0659 Last data filed at 01/02/2022 0303 Gross per 24 hour  Intake 240 ml  Output 1675 ml  Net -1435 ml    Left LE anterior shin wound has healed well Doppler peroneal signal left LE, no ischemic skin changes Right groin soft Lungs non labored breathing  Assessment/Planning: POD # 2 angiogram with diagnostic left LE runoff Left Lower Extremity: Left common femoral profundofemoral artery are widely patent.  The superficial femoral artery occludes at the level of the previously placed stents in the adductor canal.  There is reconstitution of the above-knee popliteal artery.  The dominant runoff is the peroneal artery which collateralizes to the posterior tibial at the ankle.  The anterior tibial is very diminutive.  He had a left anterior shin non healing wound this appears to be healing.  He has a previous right AKA and is not ambulatory.  As long as the wound continues to fully heal he can continue activity as tolerates.  Maximum medical management with aspirin, Plavix and statin.   Follow up appointment in in 2-3 weeks for wound check.  If the wound fails to heal or he develops a new non healing wound he will require left femoral to below-knee popliteal bypass graft.  Pending SNF discharge when bed available   Lonnie [name] 01/02/2022 6:59 AM --  Laboratory Lab Results: Recent Labs    12/31/21 0620  HGB 12.6*  HCT 37.0*   BMET Recent Labs    12/31/21 0620  NA 141  K 4.2  CL 104  GLUCOSE 107*  BUN 24*  CREATININE 1.20    COAG Lab Results  Component Value Date   INR 1.3 (H) 07/04/2021   INR 1.1 06/27/2021   INR 1.1 09/10/2020   No  results found for: PTT  I agree with the above.  I spoke with the daughter via phone this am.  They are excited about possible td/c to [facility]

## 2022-01-02 NOTE — Plan of Care (Signed)

## 2022-01-02 NOTE — TOC Progression Note (Signed)
Transition of Care [facility]) - Progression Note    Patient Details  Name: Lonnie [name] MRN: [id number] Date of Birth: 12-Sep-1951  Transition of Care [facility]) CM/SW Contact  Ivette Loyal, [place] Phone Number: 01/02/2022, 2:52 PM  Clinical Narrative:    Pt PASSR is complete [id number], [employer] system was down to check on auth. CSW contacted [employer], a representative stated that all systems has been in and out today and auth was not started. CSW asked representative to start new auth.  Pt auth ref# [id number]. CSW followed up with pt daughter to inform her that pt will not DC until the morning.   Expected Discharge Plan: Skilled Nursing Facility Barriers to Discharge: Continued Medical Work up  Expected Discharge Plan and Services Expected Discharge Plan: Skilled Nursing Facility In-house Referral: Clinical Social Work   Post Acute Care Choice: Skilled Nursing Facility Living arrangements for the past 2 months: Single Family Home Expected Discharge Date: 01/01/22                                     Social Determinants of Health (SDOH) Interventions    Readmission Risk Interventions Readmission Risk Prevention Plan 06/14/2021 09/12/2020 03/12/2020  Post Dischage Appt Complete - -  Medication Screening Complete - -  Transportation Screening Complete Complete -  PCP or Specialist Appt within 3-5 Days - Complete -  HRI or Home Care Consult - Complete -  Social Work Consult for Recovery Care Planning/Counseling - Complete -  Palliative Care Screening - Not Applicable -  Medication Review Oceanographer) - Complete Complete  Some recent data might be hidden

## 2022-01-03 DIAGNOSIS — Z89511 Acquired absence of right leg below knee: Secondary | ICD-10-CM | POA: Diagnosis not present

## 2022-01-03 DIAGNOSIS — I2510 Atherosclerotic heart disease of native coronary artery without angina pectoris: Secondary | ICD-10-CM | POA: Diagnosis not present

## 2022-01-03 DIAGNOSIS — G9009 Other idiopathic peripheral autonomic neuropathy: Secondary | ICD-10-CM | POA: Diagnosis not present

## 2022-01-03 DIAGNOSIS — Z87820 Personal history of traumatic brain injury: Secondary | ICD-10-CM | POA: Diagnosis not present

## 2022-01-03 DIAGNOSIS — K219 Gastro-esophageal reflux disease without esophagitis: Secondary | ICD-10-CM | POA: Diagnosis not present

## 2022-01-03 DIAGNOSIS — I70248 Atherosclerosis of native arteries of left leg with ulceration of other part of lower left leg: Secondary | ICD-10-CM | POA: Diagnosis not present

## 2022-01-03 DIAGNOSIS — I5042 Chronic combined systolic (congestive) and diastolic (congestive) heart failure: Secondary | ICD-10-CM | POA: Diagnosis not present

## 2022-01-03 DIAGNOSIS — N179 Acute kidney failure, unspecified: Secondary | ICD-10-CM | POA: Diagnosis not present

## 2022-01-03 DIAGNOSIS — T827XXD Infection and inflammatory reaction due to other cardiac and vascular devices, implants and grafts, subsequent encounter: Secondary | ICD-10-CM | POA: Diagnosis not present

## 2022-01-03 DIAGNOSIS — I10 Essential (primary) hypertension: Secondary | ICD-10-CM | POA: Diagnosis not present

## 2022-01-03 DIAGNOSIS — D500 Iron deficiency anemia secondary to blood loss (chronic): Secondary | ICD-10-CM | POA: Diagnosis not present

## 2022-01-03 DIAGNOSIS — I255 Ischemic cardiomyopathy: Secondary | ICD-10-CM | POA: Diagnosis not present

## 2022-01-03 DIAGNOSIS — Z8616 Personal history of COVID-19: Secondary | ICD-10-CM | POA: Diagnosis not present

## 2022-01-03 DIAGNOSIS — Z743 Need for continuous supervision: Secondary | ICD-10-CM | POA: Diagnosis not present

## 2022-01-03 DIAGNOSIS — Z951 Presence of aortocoronary bypass graft: Secondary | ICD-10-CM | POA: Diagnosis not present

## 2022-01-03 DIAGNOSIS — Z720 Tobacco use: Secondary | ICD-10-CM | POA: Diagnosis not present

## 2022-01-03 DIAGNOSIS — T82856A Stenosis of peripheral vascular stent, initial encounter: Secondary | ICD-10-CM | POA: Diagnosis not present

## 2022-01-03 DIAGNOSIS — E46 Unspecified protein-calorie malnutrition: Secondary | ICD-10-CM | POA: Diagnosis not present

## 2022-01-03 DIAGNOSIS — Z1159 Encounter for screening for other viral diseases: Secondary | ICD-10-CM | POA: Diagnosis not present

## 2022-01-03 DIAGNOSIS — D508 Other iron deficiency anemias: Secondary | ICD-10-CM | POA: Diagnosis not present

## 2022-01-03 DIAGNOSIS — M6281 Muscle weakness (generalized): Secondary | ICD-10-CM | POA: Diagnosis not present

## 2022-01-03 DIAGNOSIS — R279 Unspecified lack of coordination: Secondary | ICD-10-CM | POA: Diagnosis not present

## 2022-01-03 DIAGNOSIS — Z89611 Acquired absence of right leg above knee: Secondary | ICD-10-CM | POA: Diagnosis not present

## 2022-01-03 DIAGNOSIS — F32A Depression, unspecified: Secondary | ICD-10-CM | POA: Diagnosis not present

## 2022-01-03 DIAGNOSIS — I739 Peripheral vascular disease, unspecified: Secondary | ICD-10-CM | POA: Diagnosis not present

## 2022-01-03 LAB — RESP PANEL BY RT-PCR (FLU A&B, COVID) ARPGX2
Influenza A by PCR: NEGATIVE
Influenza B by PCR: NEGATIVE
SARS Coronavirus 2 by RT PCR: NEGATIVE

## 2022-01-03 NOTE — Progress Notes (Signed)
Peripheral IV d/c without complications, site clean dry and intact.   Report given to [place], RN at [facility]

## 2022-01-03 NOTE — Progress Notes (Signed)
Physical Therapy Treatment Patient Details Name: Lonnie [name] MRN: [id number] DOB: 29-Jan-1951 Today's Date: 01/03/2022   History of Present Illness Pt is a 71 y.o. male admitted 12/31/21 with LLE ulcer, s/p abdominal aortogram with LLE runoff same day. Pt with fall out of chair 1/12. PMH includes PAD s/p R AKA (06/2021), HTN, HLD, BPH, GERD, stroke, SDH, dementia, anxiety, depression.   PT Comments    Pt progressing with mobility; today's session focused on LE and core strengthening. Pt remains limited by generalized weakness, decreased activity tolerance, and impaired cognition. Recommendation remains SNF at this time per family request. Will continue to follow acutely to address established goals.    Recommendations for follow up therapy are one component of a multi-disciplinary discharge planning process, led by the attending physician.  Recommendations may be updated based on patient status, additional functional criteria and insurance authorization.  Follow Up Recommendations  Skilled nursing-short term rehab (<3 hours/day)     Assistance Recommended at Discharge Intermittent Supervision/Assistance  Patient can return home with the following A little help with walking and/or transfers;A little help with bathing/dressing/bathroom;Assistance with cooking/housework;Assist for transportation;Help with stairs or ramp for entrance   Equipment Recommendations  None recommended by PT    Recommendations for Other Services       Precautions / Restrictions Precautions Precautions: Fall;Other (comment) Precaution Comments: h/o R AKA (does not have prosthetic in room)     Mobility  Bed Mobility Overal bed mobility: Independent             General bed mobility comments: able to reposition, come from supine in flat bed to long sitting and to EOB independent    Transfers                   General transfer comment:  (deferred as pt just back to bed with nursing staff)     Ambulation/Gait                   Stairs             Wheelchair Mobility    Modified Rankin (Stroke Patients Only)       Balance Overall balance assessment: Needs assistance Sitting-balance support: No upper extremity supported Sitting balance-Leahy Scale: Fair                                      Cognition Arousal/Alertness: Awake/alert Behavior During Therapy: WFL for tasks assessed/performed Overall Cognitive Status: No family/caregiver present to determine baseline cognitive functioning                                 General Comments: Per chart, h/o dementia. Pt pleasant and WFL for majority of simple tasks. Demonstrates slowed processing, memory deficits, difficulty getting words out at times        Exercises Other Exercises Other Exercises: Pt performed therex - sit-ups, single leg bridge, BLE supine piriformis stretch, SLR, heel slides - [employer] HEP handout  provided    General Comments        Pertinent Vitals/Pain Pain Assessment: No/denies pain    Home Living                          Prior Function            PT Goals (  current goals can now be found in the care plan section) Progress towards PT goals: Progressing toward goals    Frequency    Min 2X/week      PT Plan Current plan remains appropriate    Co-evaluation              AM-PAC PT "6 Clicks" Mobility   Outcome Measure  Help needed turning from your back to your side while in a flat bed without using bedrails?: None Help needed moving from lying on your back to sitting on the side of a flat bed without using bedrails?: None Help needed moving to and from a bed to a chair (including a wheelchair)?: A Little Help needed standing up from a chair using your arms (e.g., wheelchair or bedside chair)?: Total Help needed to walk in hospital room?: Total Help needed climbing 3-5 steps with a railing? : Total 6 Click Score:  14    End of Session   Activity Tolerance: Patient tolerated treatment well Patient left: in bed;with call bell/phone within reach;with bed alarm set Nurse Communication: Mobility status PT Visit Diagnosis: Other abnormalities of gait and mobility (R26.89)     Time: 5701-7793 PT Time Calculation (min) (ACUTE ONLY): 20 min  Charges:  $Therapeutic Exercise: 8-22 mins                     Ina Homes, PT, DPT Acute Rehabilitation Services  Pager [phone] Office [phone]  Malachy Chamber 01/03/2022, 2:05 PM

## 2022-01-03 NOTE — Progress Notes (Addendum)
°  Progress Note    01/03/2022 7:39 AM 3 Days Post-Op  Subjective:  no complaints. States he wants to go home.   Vitals:   01/02/22 2315 01/03/22 0324  BP: 128/73 138/74  Pulse: 62 70  Resp: 16 15  Temp: 98.2 F (36.8 C) 98.1 F (36.7 C)  SpO2: 98% 98%   Physical Exam: Cardiac:  regular Lungs:  non labored Extremities:  left anterior shin wounds stable. Right AKA healed Neurologic: alert  CBC    Component Value Date/Time   WBC 9.3 08/13/2021 1442   RBC 3.99 (L) 08/13/2021 1442   HGB 12.6 (L) 12/31/2021 0620   HGB 15.7 01/10/2020 1146   HCT 37.0 (L) 12/31/2021 0620   HCT 46.1 01/10/2020 1146   PLT 346.0 08/13/2021 1442   PLT 232 01/10/2020 1146   MCV 75.9 (L) 08/13/2021 1442   MCV 83 01/10/2020 1146   MCH 26.6 07/10/2021 0500   MCHC 31.8 08/13/2021 1442   RDW 21.5 (H) 08/13/2021 1442   RDW 12.8 01/10/2020 1146   LYMPHSABS 1.8 08/13/2021 1442   LYMPHSABS 2.5 03/01/2015 1414   MONOABS 0.7 08/13/2021 1442   EOSABS 0.7 08/13/2021 1442   EOSABS 0.4 03/01/2015 1414   BASOSABS 0.1 08/13/2021 1442   BASOSABS 0.1 03/01/2015 1414    BMET    Component Value Date/Time   NA 141 12/31/2021 0620   NA 142 01/10/2020 1146   K 4.2 12/31/2021 0620   CL 104 12/31/2021 0620   CO2 26 10/10/2021 1130   GLUCOSE 107 (H) 12/31/2021 0620   BUN 24 (H) 12/31/2021 0620   BUN 19 01/10/2020 1146   CREATININE 1.20 12/31/2021 0620   CREATININE 0.94 05/24/2021 1549   CALCIUM 9.7 10/10/2021 1130   GFRNONAA >60 07/10/2021 0500   GFRAA >60 09/12/2020 0357    INR    Component Value Date/Time   INR 1.3 (H) 07/04/2021 0915     Intake/Output Summary (Last 24 hours) at 01/03/2022 0739 Last data filed at 01/03/2022 0326 Gross per 24 hour  Intake 3 ml  Output 900 ml  Net -897 ml     Assessment/Plan:  71 y.o. male is s/p LLE diagnostic Angiogram 3 Days Post-Op   Non healing left lower extremity wound. Some improvement Patient and family aware that if wound fails to heal or new  wounds develop patient will need surgical revascularization with fem- pop bypass Non ambulatory with right AKA Continue Aspirin, Plavix, Statin Waiting on authorization for SNF, hopefully will d/c today Has follow up appointment in our office in 2-3 weeks for wound check   [facility] [phone] 01/03/2022 7:39 AM  I agree with the above.  Plan for d/c to SNF today  Lonnie [name]

## 2022-01-03 NOTE — Progress Notes (Signed)
Occupational Therapy Treatment Patient Details Name: Lonnie [name] MRN: [id number] DOB: [birth year] Today's Date: 01/03/2022   History of present illness Pt is a 71 y.o. male admitted 12/31/21 with LLE ulcer, s/p abdominal aortogram with LLE runoff same day. PMH includes PAD s/p R AKA (06/2021), HTN, HLD, BPH, GERD, stroke, SDH, dementia, anxiety, depression.   OT comments  Pt progressed to chair level this session and adls at sink level. Pt requires increased (A) from OOB to chair this session compared to prior session min (A) with lateral scoot. Pt reports poor sleep and not falling asleep til 4pm. Pt has chair alarm set in room and noted to have fall this admission. Pt showing some improved STM this session by recalling therapist name throughout session multiple times.  Recommendation remains SNF at this time per family request.    Recommendations for follow up therapy are one component of a multi-disciplinary discharge planning process, led by the attending physician.  Recommendations may be updated based on patient status, additional functional criteria and insurance authorization.    Follow Up Recommendations  Skilled nursing-short term rehab (<3 hours/day)    Assistance Recommended at Discharge PRN  Patient can return home with the following  Assistance with cooking/housework;Assist for transportation   Equipment Recommendations  None recommended by OT    Recommendations for Other Services      Precautions / Restrictions Precautions Precautions: Fall;Other (comment) Precaution Comments: h/o R AKA (does not have prosthetic in room) Restrictions Weight Bearing Restrictions: Yes RLE Weight Bearing: Non weight bearing Other Position/Activity Restrictions: needs prosthetic and not present at this time       Mobility Bed Mobility Overal bed mobility: Independent             General bed mobility comments: attempting to exit bed on left side initially. pt needs cues to  come to R due to chair    Transfers Overall transfer level: Needs assistance Equipment used: None Transfers: Bed to chair/wheelchair/BSC            Lateral/Scoot Transfers: Min assist General transfer comment: pt required x3 attempts to lateral transfer toward chair. pt unable to answer shift and squat pivot this session     Balance Overall balance assessment: Needs assistance Sitting-balance support: Bilateral upper extremity supported;Feet supported Sitting balance-Leahy Scale: Fair                                     ADL either performed or assessed with clinical judgement   ADL Overall ADL's : Needs assistance/impaired Eating/Feeding: Set up;Sitting   Grooming: Wash/dry hands;Wash/dry face;Oral care;Set up;Sitting Grooming Details (indicate cue type and reason): pt able to sequence all task                               General ADL Comments: pt requires increased (A) with transfer this session compared to prior session.    Extremity/Trunk Assessment Upper Extremity Assessment Upper Extremity Assessment: Overall WFL for tasks assessed   Lower Extremity Assessment Lower Extremity Assessment: Defer to PT evaluation        Vision       Perception     Praxis      Cognition Arousal/Alertness: Awake/alert Behavior During Therapy: WFL for tasks assessed/performed Overall Cognitive Status: No family/caregiver present to determine baseline cognitive functioning Area of Impairment: Memory  Memory: Decreased short-term memory                    Exercises     Shoulder Instructions       General Comments VSS    Pertinent Vitals/ Pain       Pain Assessment: No/denies pain  Home Living                                          Prior Functioning/Environment              Frequency  Min 2X/week        Progress Toward Goals  OT Goals(current goals can now be found in  the care plan section)  Progress towards OT goals: Progressing toward goals  Acute Rehab OT Goals Patient Stated Goal: pt states "my wife sold our house. I am not even on the new one. i think she is waiting for me to die" OT Goal Formulation: Patient unable to participate in goal setting Time For Goal Achievement: 01/15/22 Potential to Achieve Goals: Good ADL Goals Pt Will Perform Upper Body Bathing: with min guard assist Pt Will Perform Lower Body Bathing: with min guard assist;sitting/lateral leans Pt Will Transfer to Toilet: with min guard assist;bedside commode;anterior/posterior transfer  Plan Discharge plan needs to be updated    Co-evaluation                 AM-PAC OT "6 Clicks" Daily Activity     Outcome Measure   Help from another person eating meals?: A Little Help from another person taking care of personal grooming?: A Little Help from another person toileting, which includes using toliet, bedpan, or urinal?: A Lot Help from another person bathing (including washing, rinsing, drying)?: A Lot Help from another person to put on and taking off regular upper body clothing?: A Little Help from another person to put on and taking off regular lower body clothing?: A Lot 6 Click Score: 15    End of Session    OT Visit Diagnosis: Unsteadiness on feet (R26.81);Muscle weakness (generalized) (M62.81)   Activity Tolerance Patient tolerated treatment well   Patient Left in chair;with call bell/phone within reach;with chair alarm set   Nurse Communication Mobility status;Precautions        Time: 3825-0539 OT Time Calculation (min): 15 min  Charges: OT General Charges $OT Visit: 1 Visit OT Treatments $Self Care/Home Management : 8-22 mins   Brynn, OTR/L  Acute Rehabilitation Services Pager: [phone] Office: [phone] .   Mateo Flow 01/03/2022, 10:41 AM

## 2022-01-03 NOTE — TOC Transition Note (Signed)
Transition of Care [facility]) - CM/SW Discharge Note   Patient Details  Name: Lonnie [name] MRN: [id number] Date of Birth: 08-May-1951  Transition of Care [facility] CM/SW Contact:  Lynett Grimes Phone Number: 01/03/2022, 3:09 PM   Clinical Narrative:    Patient will DC to: [facility] Care Anticipated DC date: 01/03/2022 Family notified: Pt Daughter Transport by: Sharin Mons   Per MD patient ready for DC to [facility]. RN to call report prior to discharge [phone]). RN, patient, patient's family, and facility notified of DC. Discharge Summary and FL2 sent to facility. DC packet on chart. Ambulance transport requested for patient.   CSW will sign off for now as social work intervention is no longer needed. Please consult [place] again if new needs arise.     Final next level of care: Skilled Nursing Facility Barriers to Discharge: Continued Medical Work up   Patient Goals and CMS Choice Patient states their goals for this hospitalization and ongoing recovery are:: Rehab CMS Medicare.gov Compare Post Acute Care list provided to:: Patient Choice offered to / list presented to : Patient  Discharge Placement                       Discharge Plan and Services In-house Referral: Clinical Social Work   Post Acute Care Choice: Skilled Nursing Facility                               Social Determinants of Health (SDOH) Interventions     Readmission Risk Interventions Readmission Risk Prevention Plan 06/14/2021 09/12/2020 03/12/2020  Post Dischage Appt Complete - -  Medication Screening Complete - -  Transportation Screening Complete Complete -  PCP or Specialist Appt within 3-5 Days - Complete -  HRI or Home Care Consult - Complete -  Social Work Consult for Recovery Care Planning/Counseling - Complete -  Palliative Care Screening - Not Applicable -  Medication Review Oceanographer) - Complete Complete  Some recent data might be hidden

## 2022-01-06 ENCOUNTER — Other Ambulatory Visit: Payer: Self-pay | Admitting: Internal Medicine

## 2022-01-09 DIAGNOSIS — I739 Peripheral vascular disease, unspecified: Secondary | ICD-10-CM | POA: Diagnosis not present

## 2022-01-09 DIAGNOSIS — D500 Iron deficiency anemia secondary to blood loss (chronic): Secondary | ICD-10-CM | POA: Diagnosis not present

## 2022-01-09 DIAGNOSIS — T827XXD Infection and inflammatory reaction due to other cardiac and vascular devices, implants and grafts, subsequent encounter: Secondary | ICD-10-CM | POA: Diagnosis not present

## 2022-01-21 ENCOUNTER — Other Ambulatory Visit: Payer: Self-pay | Admitting: Internal Medicine

## 2022-01-22 DIAGNOSIS — J449 Chronic obstructive pulmonary disease, unspecified: Secondary | ICD-10-CM | POA: Diagnosis not present

## 2022-01-22 DIAGNOSIS — I2510 Atherosclerotic heart disease of native coronary artery without angina pectoris: Secondary | ICD-10-CM | POA: Diagnosis not present

## 2022-01-22 DIAGNOSIS — I739 Peripheral vascular disease, unspecified: Secondary | ICD-10-CM | POA: Diagnosis not present

## 2022-01-22 DIAGNOSIS — I255 Ischemic cardiomyopathy: Secondary | ICD-10-CM | POA: Diagnosis not present

## 2022-01-22 DIAGNOSIS — Z89611 Acquired absence of right leg above knee: Secondary | ICD-10-CM | POA: Diagnosis not present

## 2022-01-22 DIAGNOSIS — Z9181 History of falling: Secondary | ICD-10-CM | POA: Diagnosis not present

## 2022-01-22 DIAGNOSIS — F0393 Unspecified dementia, unspecified severity, with mood disturbance: Secondary | ICD-10-CM | POA: Diagnosis not present

## 2022-01-22 DIAGNOSIS — F0394 Unspecified dementia, unspecified severity, with anxiety: Secondary | ICD-10-CM | POA: Diagnosis not present

## 2022-01-22 DIAGNOSIS — Z7902 Long term (current) use of antithrombotics/antiplatelets: Secondary | ICD-10-CM | POA: Diagnosis not present

## 2022-01-22 DIAGNOSIS — I110 Hypertensive heart disease with heart failure: Secondary | ICD-10-CM | POA: Diagnosis not present

## 2022-01-22 DIAGNOSIS — Z720 Tobacco use: Secondary | ICD-10-CM | POA: Diagnosis not present

## 2022-01-22 DIAGNOSIS — I5042 Chronic combined systolic (congestive) and diastolic (congestive) heart failure: Secondary | ICD-10-CM | POA: Diagnosis not present

## 2022-01-22 DIAGNOSIS — G629 Polyneuropathy, unspecified: Secondary | ICD-10-CM | POA: Diagnosis not present

## 2022-01-22 DIAGNOSIS — Z8673 Personal history of transient ischemic attack (TIA), and cerebral infarction without residual deficits: Secondary | ICD-10-CM | POA: Diagnosis not present

## 2022-01-22 DIAGNOSIS — I252 Old myocardial infarction: Secondary | ICD-10-CM | POA: Diagnosis not present

## 2022-01-22 DIAGNOSIS — Z993 Dependence on wheelchair: Secondary | ICD-10-CM | POA: Diagnosis not present

## 2022-01-22 DIAGNOSIS — M150 Primary generalized (osteo)arthritis: Secondary | ICD-10-CM | POA: Diagnosis not present

## 2022-01-22 DIAGNOSIS — K219 Gastro-esophageal reflux disease without esophagitis: Secondary | ICD-10-CM | POA: Diagnosis not present

## 2022-01-22 DIAGNOSIS — F32A Depression, unspecified: Secondary | ICD-10-CM | POA: Diagnosis not present

## 2022-01-22 DIAGNOSIS — Z87440 Personal history of urinary (tract) infections: Secondary | ICD-10-CM | POA: Diagnosis not present

## 2022-01-22 DIAGNOSIS — R7303 Prediabetes: Secondary | ICD-10-CM | POA: Diagnosis not present

## 2022-01-24 ENCOUNTER — Other Ambulatory Visit: Payer: Medicare Other

## 2022-01-24 ENCOUNTER — Other Ambulatory Visit: Payer: Self-pay

## 2022-01-24 DIAGNOSIS — Z515 Encounter for palliative care: Secondary | ICD-10-CM

## 2022-01-24 NOTE — Progress Notes (Signed)
COMMUNITY PALLIATIVE CARE SW NOTE  PATIENT NAME: AMIEL MCCAFFREY DOB: 15-Aug-1951 MRN: [id number]  PRIMARY CARE PROVIDER: [name], MD  RESPONSIBLE PARTY:  Acct ID - Guarantor Home Phone Work Phone Relationship Acct Type  [id number] Ediel, [place] [phone]  Self P/F     [address], [place], [place] [address]   Due to the COVID-19 crisis, this virtual check-in visit was done via telephone from my office and it was initiated and consent by this patient and or family.   SOCIAL WORK TELEPHONIC ENCOUNTER (10:00-10:15 am)   PC SW completed a telephonic follow-up call to patient's daughter-Felisha following patient's hospitalization to provide support. Daughter report that patient is adjusting well since returning home. He is scheduled to have PT/OT with [facility]. She also reported that he dementia has really progressed as patient is more confused overall. SW advised Stephan Minister that she will request that Dr. Renato Gails be scheduled for a visit with patient for a follow-up visit. Felisha verbalized appreciation for the call. SW reinforced access to support if needed.   [address] [place], [place]

## 2022-01-29 DIAGNOSIS — Z87440 Personal history of urinary (tract) infections: Secondary | ICD-10-CM | POA: Diagnosis not present

## 2022-01-29 DIAGNOSIS — Z9181 History of falling: Secondary | ICD-10-CM | POA: Diagnosis not present

## 2022-01-29 DIAGNOSIS — R7303 Prediabetes: Secondary | ICD-10-CM | POA: Diagnosis not present

## 2022-01-29 DIAGNOSIS — G629 Polyneuropathy, unspecified: Secondary | ICD-10-CM | POA: Diagnosis not present

## 2022-01-29 DIAGNOSIS — F32A Depression, unspecified: Secondary | ICD-10-CM | POA: Diagnosis not present

## 2022-01-29 DIAGNOSIS — Z7902 Long term (current) use of antithrombotics/antiplatelets: Secondary | ICD-10-CM | POA: Diagnosis not present

## 2022-01-29 DIAGNOSIS — I255 Ischemic cardiomyopathy: Secondary | ICD-10-CM | POA: Diagnosis not present

## 2022-01-29 DIAGNOSIS — Z89611 Acquired absence of right leg above knee: Secondary | ICD-10-CM | POA: Diagnosis not present

## 2022-01-29 DIAGNOSIS — F0394 Unspecified dementia, unspecified severity, with anxiety: Secondary | ICD-10-CM | POA: Diagnosis not present

## 2022-01-29 DIAGNOSIS — J449 Chronic obstructive pulmonary disease, unspecified: Secondary | ICD-10-CM | POA: Diagnosis not present

## 2022-01-29 DIAGNOSIS — Z8673 Personal history of transient ischemic attack (TIA), and cerebral infarction without residual deficits: Secondary | ICD-10-CM | POA: Diagnosis not present

## 2022-01-29 DIAGNOSIS — M150 Primary generalized (osteo)arthritis: Secondary | ICD-10-CM | POA: Diagnosis not present

## 2022-01-29 DIAGNOSIS — Z720 Tobacco use: Secondary | ICD-10-CM | POA: Diagnosis not present

## 2022-01-29 DIAGNOSIS — I5042 Chronic combined systolic (congestive) and diastolic (congestive) heart failure: Secondary | ICD-10-CM | POA: Diagnosis not present

## 2022-01-29 DIAGNOSIS — Z993 Dependence on wheelchair: Secondary | ICD-10-CM | POA: Diagnosis not present

## 2022-01-29 DIAGNOSIS — I110 Hypertensive heart disease with heart failure: Secondary | ICD-10-CM | POA: Diagnosis not present

## 2022-01-29 DIAGNOSIS — I252 Old myocardial infarction: Secondary | ICD-10-CM | POA: Diagnosis not present

## 2022-01-29 DIAGNOSIS — I739 Peripheral vascular disease, unspecified: Secondary | ICD-10-CM | POA: Diagnosis not present

## 2022-01-29 DIAGNOSIS — F0393 Unspecified dementia, unspecified severity, with mood disturbance: Secondary | ICD-10-CM | POA: Diagnosis not present

## 2022-01-29 DIAGNOSIS — K219 Gastro-esophageal reflux disease without esophagitis: Secondary | ICD-10-CM | POA: Diagnosis not present

## 2022-01-29 DIAGNOSIS — I2510 Atherosclerotic heart disease of native coronary artery without angina pectoris: Secondary | ICD-10-CM | POA: Diagnosis not present

## 2022-01-30 DIAGNOSIS — Z9181 History of falling: Secondary | ICD-10-CM | POA: Diagnosis not present

## 2022-01-30 DIAGNOSIS — I255 Ischemic cardiomyopathy: Secondary | ICD-10-CM | POA: Diagnosis not present

## 2022-01-30 DIAGNOSIS — Z89611 Acquired absence of right leg above knee: Secondary | ICD-10-CM | POA: Diagnosis not present

## 2022-01-30 DIAGNOSIS — I110 Hypertensive heart disease with heart failure: Secondary | ICD-10-CM | POA: Diagnosis not present

## 2022-01-30 DIAGNOSIS — J449 Chronic obstructive pulmonary disease, unspecified: Secondary | ICD-10-CM | POA: Diagnosis not present

## 2022-01-30 DIAGNOSIS — Z7902 Long term (current) use of antithrombotics/antiplatelets: Secondary | ICD-10-CM | POA: Diagnosis not present

## 2022-01-30 DIAGNOSIS — Z87440 Personal history of urinary (tract) infections: Secondary | ICD-10-CM | POA: Diagnosis not present

## 2022-01-30 DIAGNOSIS — Z8673 Personal history of transient ischemic attack (TIA), and cerebral infarction without residual deficits: Secondary | ICD-10-CM | POA: Diagnosis not present

## 2022-01-30 DIAGNOSIS — I252 Old myocardial infarction: Secondary | ICD-10-CM | POA: Diagnosis not present

## 2022-01-30 DIAGNOSIS — I2510 Atherosclerotic heart disease of native coronary artery without angina pectoris: Secondary | ICD-10-CM | POA: Diagnosis not present

## 2022-01-30 DIAGNOSIS — F32A Depression, unspecified: Secondary | ICD-10-CM | POA: Diagnosis not present

## 2022-01-30 DIAGNOSIS — G629 Polyneuropathy, unspecified: Secondary | ICD-10-CM | POA: Diagnosis not present

## 2022-01-30 DIAGNOSIS — I5042 Chronic combined systolic (congestive) and diastolic (congestive) heart failure: Secondary | ICD-10-CM | POA: Diagnosis not present

## 2022-01-30 DIAGNOSIS — K219 Gastro-esophageal reflux disease without esophagitis: Secondary | ICD-10-CM | POA: Diagnosis not present

## 2022-01-30 DIAGNOSIS — R7303 Prediabetes: Secondary | ICD-10-CM | POA: Diagnosis not present

## 2022-01-30 DIAGNOSIS — M150 Primary generalized (osteo)arthritis: Secondary | ICD-10-CM | POA: Diagnosis not present

## 2022-01-30 DIAGNOSIS — F0394 Unspecified dementia, unspecified severity, with anxiety: Secondary | ICD-10-CM | POA: Diagnosis not present

## 2022-01-30 DIAGNOSIS — Z993 Dependence on wheelchair: Secondary | ICD-10-CM | POA: Diagnosis not present

## 2022-01-30 DIAGNOSIS — F0393 Unspecified dementia, unspecified severity, with mood disturbance: Secondary | ICD-10-CM | POA: Diagnosis not present

## 2022-01-30 DIAGNOSIS — Z720 Tobacco use: Secondary | ICD-10-CM | POA: Diagnosis not present

## 2022-01-30 DIAGNOSIS — I739 Peripheral vascular disease, unspecified: Secondary | ICD-10-CM | POA: Diagnosis not present

## 2022-01-31 DIAGNOSIS — Z89611 Acquired absence of right leg above knee: Secondary | ICD-10-CM | POA: Diagnosis not present

## 2022-01-31 DIAGNOSIS — I5042 Chronic combined systolic (congestive) and diastolic (congestive) heart failure: Secondary | ICD-10-CM | POA: Diagnosis not present

## 2022-01-31 DIAGNOSIS — Z7902 Long term (current) use of antithrombotics/antiplatelets: Secondary | ICD-10-CM | POA: Diagnosis not present

## 2022-01-31 DIAGNOSIS — Z993 Dependence on wheelchair: Secondary | ICD-10-CM | POA: Diagnosis not present

## 2022-01-31 DIAGNOSIS — J449 Chronic obstructive pulmonary disease, unspecified: Secondary | ICD-10-CM | POA: Diagnosis not present

## 2022-01-31 DIAGNOSIS — F0393 Unspecified dementia, unspecified severity, with mood disturbance: Secondary | ICD-10-CM | POA: Diagnosis not present

## 2022-01-31 DIAGNOSIS — Z9181 History of falling: Secondary | ICD-10-CM | POA: Diagnosis not present

## 2022-01-31 DIAGNOSIS — R7303 Prediabetes: Secondary | ICD-10-CM | POA: Diagnosis not present

## 2022-01-31 DIAGNOSIS — I255 Ischemic cardiomyopathy: Secondary | ICD-10-CM | POA: Diagnosis not present

## 2022-01-31 DIAGNOSIS — I2510 Atherosclerotic heart disease of native coronary artery without angina pectoris: Secondary | ICD-10-CM | POA: Diagnosis not present

## 2022-01-31 DIAGNOSIS — K219 Gastro-esophageal reflux disease without esophagitis: Secondary | ICD-10-CM | POA: Diagnosis not present

## 2022-01-31 DIAGNOSIS — F32A Depression, unspecified: Secondary | ICD-10-CM | POA: Diagnosis not present

## 2022-01-31 DIAGNOSIS — I252 Old myocardial infarction: Secondary | ICD-10-CM | POA: Diagnosis not present

## 2022-01-31 DIAGNOSIS — F0394 Unspecified dementia, unspecified severity, with anxiety: Secondary | ICD-10-CM | POA: Diagnosis not present

## 2022-01-31 DIAGNOSIS — I110 Hypertensive heart disease with heart failure: Secondary | ICD-10-CM | POA: Diagnosis not present

## 2022-01-31 DIAGNOSIS — G629 Polyneuropathy, unspecified: Secondary | ICD-10-CM | POA: Diagnosis not present

## 2022-01-31 DIAGNOSIS — Z720 Tobacco use: Secondary | ICD-10-CM | POA: Diagnosis not present

## 2022-01-31 DIAGNOSIS — I739 Peripheral vascular disease, unspecified: Secondary | ICD-10-CM | POA: Diagnosis not present

## 2022-01-31 DIAGNOSIS — Z87440 Personal history of urinary (tract) infections: Secondary | ICD-10-CM | POA: Diagnosis not present

## 2022-01-31 DIAGNOSIS — M150 Primary generalized (osteo)arthritis: Secondary | ICD-10-CM | POA: Diagnosis not present

## 2022-01-31 DIAGNOSIS — Z8673 Personal history of transient ischemic attack (TIA), and cerebral infarction without residual deficits: Secondary | ICD-10-CM | POA: Diagnosis not present

## 2022-02-03 ENCOUNTER — Telehealth: Payer: Self-pay | Admitting: Internal Medicine

## 2022-02-03 ENCOUNTER — Ambulatory Visit: Payer: Medicare Other | Admitting: Surgery

## 2022-02-04 ENCOUNTER — Other Ambulatory Visit: Payer: Self-pay

## 2022-02-04 MED ORDER — CITALOPRAM HYDROBROMIDE 20 MG PO TABS: 10.0000 mg | ORAL_TABLET | Freq: Two times a day (BID) | ORAL | 1 refills | Status: DC

## 2022-02-04 NOTE — Telephone Encounter (Signed)
Requesting: clonazepam 0.25mg   Contract: None UDS: None Last Visit: 10/10/2021 Next Visit:None  Last Refill: 09/04/2021 #180 and 0RF  Please Advise

## 2022-02-04 NOTE — Telephone Encounter (Signed)
PDMP okay, prescription sent 

## 2022-02-05 DIAGNOSIS — Z993 Dependence on wheelchair: Secondary | ICD-10-CM | POA: Diagnosis not present

## 2022-02-05 DIAGNOSIS — F0393 Unspecified dementia, unspecified severity, with mood disturbance: Secondary | ICD-10-CM | POA: Diagnosis not present

## 2022-02-05 DIAGNOSIS — Z89611 Acquired absence of right leg above knee: Secondary | ICD-10-CM | POA: Diagnosis not present

## 2022-02-05 DIAGNOSIS — F0394 Unspecified dementia, unspecified severity, with anxiety: Secondary | ICD-10-CM | POA: Diagnosis not present

## 2022-02-05 DIAGNOSIS — Z720 Tobacco use: Secondary | ICD-10-CM | POA: Diagnosis not present

## 2022-02-05 DIAGNOSIS — I2510 Atherosclerotic heart disease of native coronary artery without angina pectoris: Secondary | ICD-10-CM | POA: Diagnosis not present

## 2022-02-05 DIAGNOSIS — I255 Ischemic cardiomyopathy: Secondary | ICD-10-CM | POA: Diagnosis not present

## 2022-02-05 DIAGNOSIS — R7303 Prediabetes: Secondary | ICD-10-CM | POA: Diagnosis not present

## 2022-02-05 DIAGNOSIS — Z7902 Long term (current) use of antithrombotics/antiplatelets: Secondary | ICD-10-CM | POA: Diagnosis not present

## 2022-02-05 DIAGNOSIS — I739 Peripheral vascular disease, unspecified: Secondary | ICD-10-CM | POA: Diagnosis not present

## 2022-02-05 DIAGNOSIS — Z8673 Personal history of transient ischemic attack (TIA), and cerebral infarction without residual deficits: Secondary | ICD-10-CM | POA: Diagnosis not present

## 2022-02-05 DIAGNOSIS — Z87440 Personal history of urinary (tract) infections: Secondary | ICD-10-CM | POA: Diagnosis not present

## 2022-02-05 DIAGNOSIS — I110 Hypertensive heart disease with heart failure: Secondary | ICD-10-CM | POA: Diagnosis not present

## 2022-02-05 DIAGNOSIS — I252 Old myocardial infarction: Secondary | ICD-10-CM | POA: Diagnosis not present

## 2022-02-05 DIAGNOSIS — F32A Depression, unspecified: Secondary | ICD-10-CM | POA: Diagnosis not present

## 2022-02-05 DIAGNOSIS — G629 Polyneuropathy, unspecified: Secondary | ICD-10-CM | POA: Diagnosis not present

## 2022-02-05 DIAGNOSIS — I5042 Chronic combined systolic (congestive) and diastolic (congestive) heart failure: Secondary | ICD-10-CM | POA: Diagnosis not present

## 2022-02-05 DIAGNOSIS — Z9181 History of falling: Secondary | ICD-10-CM | POA: Diagnosis not present

## 2022-02-05 DIAGNOSIS — J449 Chronic obstructive pulmonary disease, unspecified: Secondary | ICD-10-CM | POA: Diagnosis not present

## 2022-02-05 DIAGNOSIS — M150 Primary generalized (osteo)arthritis: Secondary | ICD-10-CM | POA: Diagnosis not present

## 2022-02-05 DIAGNOSIS — K219 Gastro-esophageal reflux disease without esophagitis: Secondary | ICD-10-CM | POA: Diagnosis not present

## 2022-02-06 ENCOUNTER — Other Ambulatory Visit: Payer: Self-pay

## 2022-02-06 ENCOUNTER — Telehealth: Payer: Self-pay

## 2022-02-06 ENCOUNTER — Other Ambulatory Visit: Payer: Medicare Other | Admitting: Internal Medicine

## 2022-02-06 DIAGNOSIS — Z89611 Acquired absence of right leg above knee: Secondary | ICD-10-CM | POA: Diagnosis not present

## 2022-02-06 DIAGNOSIS — Z515 Encounter for palliative care: Secondary | ICD-10-CM | POA: Diagnosis not present

## 2022-02-06 DIAGNOSIS — N179 Acute kidney failure, unspecified: Secondary | ICD-10-CM | POA: Diagnosis not present

## 2022-02-06 DIAGNOSIS — S069X0S Unspecified intracranial injury without loss of consciousness, sequela: Secondary | ICD-10-CM | POA: Diagnosis not present

## 2022-02-06 DIAGNOSIS — G3109 Other frontotemporal dementia: Secondary | ICD-10-CM

## 2022-02-06 DIAGNOSIS — I739 Peripheral vascular disease, unspecified: Secondary | ICD-10-CM

## 2022-02-06 DIAGNOSIS — M6281 Muscle weakness (generalized): Secondary | ICD-10-CM | POA: Diagnosis not present

## 2022-02-06 DIAGNOSIS — F0280 Dementia in other diseases classified elsewhere without behavioral disturbance: Secondary | ICD-10-CM
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
## 2022-02-06 NOTE — Telephone Encounter (Signed)
PC SW shared information with patient's daughter regarding how to access assistance with a ramp and [employer] via text.

## 2022-02-07 DIAGNOSIS — Z993 Dependence on wheelchair: Secondary | ICD-10-CM | POA: Diagnosis not present

## 2022-02-07 DIAGNOSIS — I110 Hypertensive heart disease with heart failure: Secondary | ICD-10-CM | POA: Diagnosis not present

## 2022-02-07 DIAGNOSIS — Z9181 History of falling: Secondary | ICD-10-CM | POA: Diagnosis not present

## 2022-02-07 DIAGNOSIS — Z87440 Personal history of urinary (tract) infections: Secondary | ICD-10-CM | POA: Diagnosis not present

## 2022-02-07 DIAGNOSIS — Z8673 Personal history of transient ischemic attack (TIA), and cerebral infarction without residual deficits: Secondary | ICD-10-CM | POA: Diagnosis not present

## 2022-02-07 DIAGNOSIS — F0394 Unspecified dementia, unspecified severity, with anxiety: Secondary | ICD-10-CM | POA: Diagnosis not present

## 2022-02-07 DIAGNOSIS — I2510 Atherosclerotic heart disease of native coronary artery without angina pectoris: Secondary | ICD-10-CM | POA: Diagnosis not present

## 2022-02-07 DIAGNOSIS — Z89611 Acquired absence of right leg above knee: Secondary | ICD-10-CM | POA: Diagnosis not present

## 2022-02-07 DIAGNOSIS — I255 Ischemic cardiomyopathy: Secondary | ICD-10-CM | POA: Diagnosis not present

## 2022-02-07 DIAGNOSIS — Z7902 Long term (current) use of antithrombotics/antiplatelets: Secondary | ICD-10-CM | POA: Diagnosis not present

## 2022-02-07 DIAGNOSIS — G629 Polyneuropathy, unspecified: Secondary | ICD-10-CM | POA: Diagnosis not present

## 2022-02-07 DIAGNOSIS — K219 Gastro-esophageal reflux disease without esophagitis: Secondary | ICD-10-CM | POA: Diagnosis not present

## 2022-02-07 DIAGNOSIS — F0393 Unspecified dementia, unspecified severity, with mood disturbance: Secondary | ICD-10-CM | POA: Diagnosis not present

## 2022-02-07 DIAGNOSIS — J449 Chronic obstructive pulmonary disease, unspecified: Secondary | ICD-10-CM | POA: Diagnosis not present

## 2022-02-07 DIAGNOSIS — M150 Primary generalized (osteo)arthritis: Secondary | ICD-10-CM | POA: Diagnosis not present

## 2022-02-07 DIAGNOSIS — Z720 Tobacco use: Secondary | ICD-10-CM | POA: Diagnosis not present

## 2022-02-07 DIAGNOSIS — I5042 Chronic combined systolic (congestive) and diastolic (congestive) heart failure: Secondary | ICD-10-CM | POA: Diagnosis not present

## 2022-02-07 DIAGNOSIS — R7303 Prediabetes: Secondary | ICD-10-CM | POA: Diagnosis not present

## 2022-02-07 DIAGNOSIS — I252 Old myocardial infarction: Secondary | ICD-10-CM | POA: Diagnosis not present

## 2022-02-07 DIAGNOSIS — F32A Depression, unspecified: Secondary | ICD-10-CM | POA: Diagnosis not present

## 2022-02-07 DIAGNOSIS — I739 Peripheral vascular disease, unspecified: Secondary | ICD-10-CM | POA: Diagnosis not present

## 2022-02-08 DIAGNOSIS — I252 Old myocardial infarction: Secondary | ICD-10-CM | POA: Diagnosis not present

## 2022-02-08 DIAGNOSIS — G629 Polyneuropathy, unspecified: Secondary | ICD-10-CM | POA: Diagnosis not present

## 2022-02-08 DIAGNOSIS — I5042 Chronic combined systolic (congestive) and diastolic (congestive) heart failure: Secondary | ICD-10-CM | POA: Diagnosis not present

## 2022-02-08 DIAGNOSIS — I110 Hypertensive heart disease with heart failure: Secondary | ICD-10-CM | POA: Diagnosis not present

## 2022-02-08 DIAGNOSIS — Z9181 History of falling: Secondary | ICD-10-CM | POA: Diagnosis not present

## 2022-02-08 DIAGNOSIS — Z993 Dependence on wheelchair: Secondary | ICD-10-CM | POA: Diagnosis not present

## 2022-02-08 DIAGNOSIS — Z7902 Long term (current) use of antithrombotics/antiplatelets: Secondary | ICD-10-CM | POA: Diagnosis not present

## 2022-02-08 DIAGNOSIS — Z8673 Personal history of transient ischemic attack (TIA), and cerebral infarction without residual deficits: Secondary | ICD-10-CM | POA: Diagnosis not present

## 2022-02-08 DIAGNOSIS — K219 Gastro-esophageal reflux disease without esophagitis: Secondary | ICD-10-CM | POA: Diagnosis not present

## 2022-02-08 DIAGNOSIS — I2510 Atherosclerotic heart disease of native coronary artery without angina pectoris: Secondary | ICD-10-CM | POA: Diagnosis not present

## 2022-02-08 DIAGNOSIS — J449 Chronic obstructive pulmonary disease, unspecified: Secondary | ICD-10-CM | POA: Diagnosis not present

## 2022-02-08 DIAGNOSIS — I739 Peripheral vascular disease, unspecified: Secondary | ICD-10-CM | POA: Diagnosis not present

## 2022-02-08 DIAGNOSIS — Z87440 Personal history of urinary (tract) infections: Secondary | ICD-10-CM | POA: Diagnosis not present

## 2022-02-08 DIAGNOSIS — F32A Depression, unspecified: Secondary | ICD-10-CM | POA: Diagnosis not present

## 2022-02-08 DIAGNOSIS — Z89611 Acquired absence of right leg above knee: Secondary | ICD-10-CM | POA: Diagnosis not present

## 2022-02-08 DIAGNOSIS — M150 Primary generalized (osteo)arthritis: Secondary | ICD-10-CM | POA: Diagnosis not present

## 2022-02-08 DIAGNOSIS — R7303 Prediabetes: Secondary | ICD-10-CM | POA: Diagnosis not present

## 2022-02-08 DIAGNOSIS — F0393 Unspecified dementia, unspecified severity, with mood disturbance: Secondary | ICD-10-CM | POA: Diagnosis not present

## 2022-02-08 DIAGNOSIS — F0394 Unspecified dementia, unspecified severity, with anxiety: Secondary | ICD-10-CM | POA: Diagnosis not present

## 2022-02-08 DIAGNOSIS — I255 Ischemic cardiomyopathy: Secondary | ICD-10-CM | POA: Diagnosis not present

## 2022-02-08 DIAGNOSIS — Z720 Tobacco use: Secondary | ICD-10-CM | POA: Diagnosis not present

## 2022-02-10 ENCOUNTER — Ambulatory Visit: Payer: Medicare Other

## 2022-02-11 ENCOUNTER — Telehealth: Payer: Self-pay

## 2022-02-11 NOTE — Telephone Encounter (Signed)
(  3:21 pm) SW completed a follow-up call to patient's daughter to confirm she received the resources for a ramp and memory care day programs that was shared with her. [name] confirmed that she did received the information and planned to start calling some of the resources. SW extended ongoing support to her and encouraged her to call with any questions or concerns.

## 2022-02-12 DIAGNOSIS — I110 Hypertensive heart disease with heart failure: Secondary | ICD-10-CM | POA: Diagnosis not present

## 2022-02-12 DIAGNOSIS — I739 Peripheral vascular disease, unspecified: Secondary | ICD-10-CM | POA: Diagnosis not present

## 2022-02-12 DIAGNOSIS — Z8673 Personal history of transient ischemic attack (TIA), and cerebral infarction without residual deficits: Secondary | ICD-10-CM | POA: Diagnosis not present

## 2022-02-12 DIAGNOSIS — Z87440 Personal history of urinary (tract) infections: Secondary | ICD-10-CM | POA: Diagnosis not present

## 2022-02-12 DIAGNOSIS — F0393 Unspecified dementia, unspecified severity, with mood disturbance: Secondary | ICD-10-CM | POA: Diagnosis not present

## 2022-02-12 DIAGNOSIS — I2510 Atherosclerotic heart disease of native coronary artery without angina pectoris: Secondary | ICD-10-CM | POA: Diagnosis not present

## 2022-02-12 DIAGNOSIS — I252 Old myocardial infarction: Secondary | ICD-10-CM | POA: Diagnosis not present

## 2022-02-12 DIAGNOSIS — Z89611 Acquired absence of right leg above knee: Secondary | ICD-10-CM | POA: Diagnosis not present

## 2022-02-12 DIAGNOSIS — J449 Chronic obstructive pulmonary disease, unspecified: Secondary | ICD-10-CM | POA: Diagnosis not present

## 2022-02-12 DIAGNOSIS — R7303 Prediabetes: Secondary | ICD-10-CM | POA: Diagnosis not present

## 2022-02-12 DIAGNOSIS — K219 Gastro-esophageal reflux disease without esophagitis: Secondary | ICD-10-CM | POA: Diagnosis not present

## 2022-02-12 DIAGNOSIS — Z993 Dependence on wheelchair: Secondary | ICD-10-CM | POA: Diagnosis not present

## 2022-02-12 DIAGNOSIS — I5042 Chronic combined systolic (congestive) and diastolic (congestive) heart failure: Secondary | ICD-10-CM | POA: Diagnosis not present

## 2022-02-12 DIAGNOSIS — G629 Polyneuropathy, unspecified: Secondary | ICD-10-CM | POA: Diagnosis not present

## 2022-02-12 DIAGNOSIS — Z720 Tobacco use: Secondary | ICD-10-CM | POA: Diagnosis not present

## 2022-02-12 DIAGNOSIS — Z9181 History of falling: Secondary | ICD-10-CM | POA: Diagnosis not present

## 2022-02-12 DIAGNOSIS — I255 Ischemic cardiomyopathy: Secondary | ICD-10-CM | POA: Diagnosis not present

## 2022-02-12 DIAGNOSIS — F32A Depression, unspecified: Secondary | ICD-10-CM | POA: Diagnosis not present

## 2022-02-12 DIAGNOSIS — Z7902 Long term (current) use of antithrombotics/antiplatelets: Secondary | ICD-10-CM | POA: Diagnosis not present

## 2022-02-12 DIAGNOSIS — M150 Primary generalized (osteo)arthritis: Secondary | ICD-10-CM | POA: Diagnosis not present

## 2022-02-12 DIAGNOSIS — F0394 Unspecified dementia, unspecified severity, with anxiety: Secondary | ICD-10-CM | POA: Diagnosis not present

## 2022-02-13 ENCOUNTER — Ambulatory Visit (HOSPITAL_BASED_OUTPATIENT_CLINIC_OR_DEPARTMENT_OTHER)
Admission: RE | Admit: 2022-02-13 | Discharge: 2022-02-13 | Disposition: A | Discharge status: Home / Self Care | Payer: Medicare Other | Source: Ambulatory Visit | Attending: Family Medicine | Admitting: Family Medicine

## 2022-02-13 ENCOUNTER — Encounter: Payer: Self-pay | Admitting: Family Medicine

## 2022-02-13 ENCOUNTER — Other Ambulatory Visit: Payer: Self-pay

## 2022-02-13 ENCOUNTER — Ambulatory Visit (INDEPENDENT_AMBULATORY_CARE_PROVIDER_SITE_OTHER): Payer: Medicare Other | Admitting: Family Medicine

## 2022-02-13 VITALS — BP 144/69 | HR 58 | Resp 20 | Ht 71.0 in | Wt 190.0 lb

## 2022-02-13 DIAGNOSIS — W19XXXA Unspecified fall, initial encounter: Secondary | ICD-10-CM

## 2022-02-13 DIAGNOSIS — M25511 Pain in right shoulder: Secondary | ICD-10-CM

## 2022-02-13 IMAGING — DX DG SHOULDER 2+V*R*
4 series · 4 of 4 positions shown · non-contrast
Comparison: None.

CLINICAL DATA: Right shoulder pain, limited range of motion

EXAM:
RIGHT SHOULDER - 2+ VIEW

[shoulder grashey]
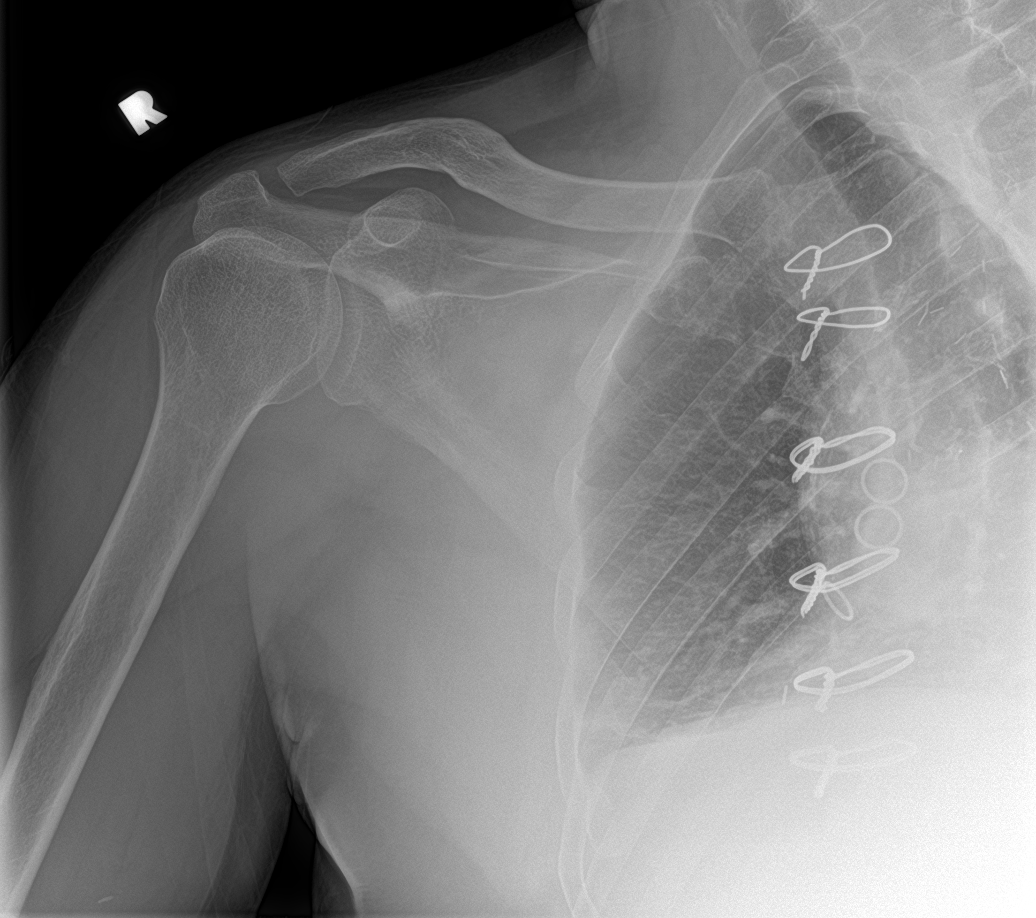

[shoulder y view]
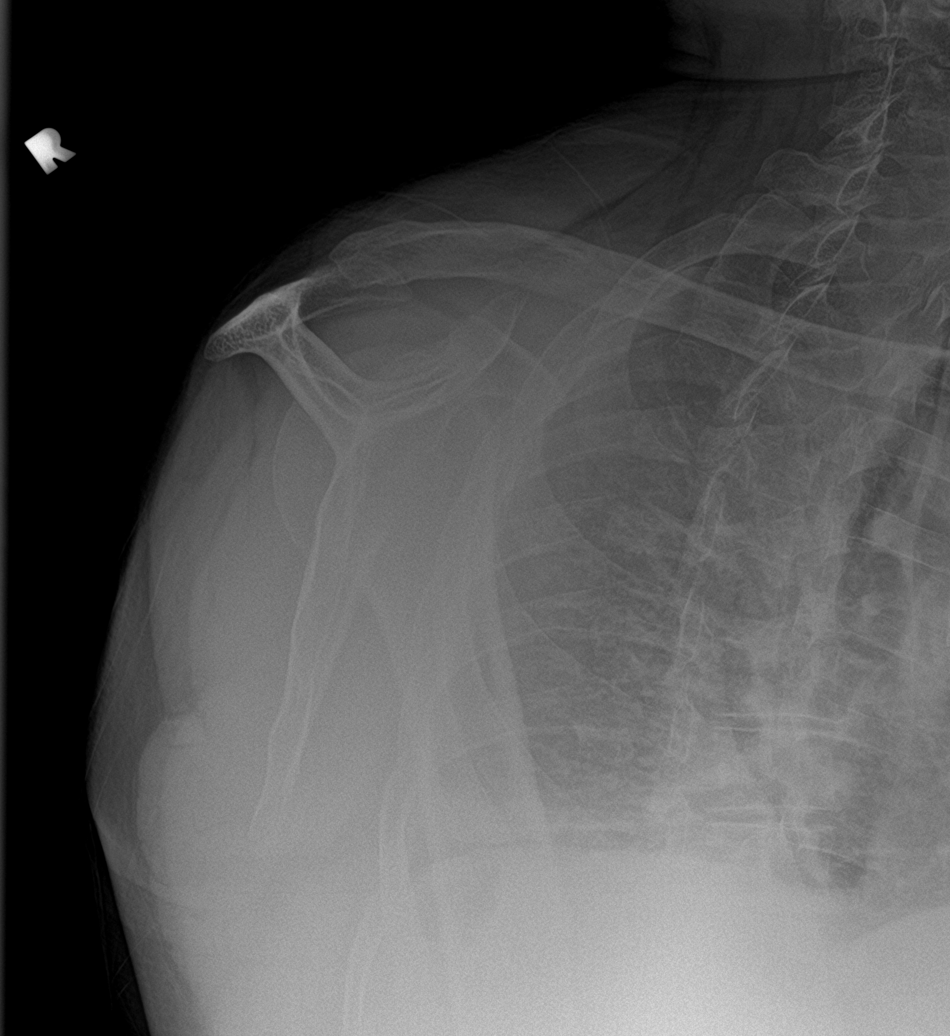

[shoulder axillary]
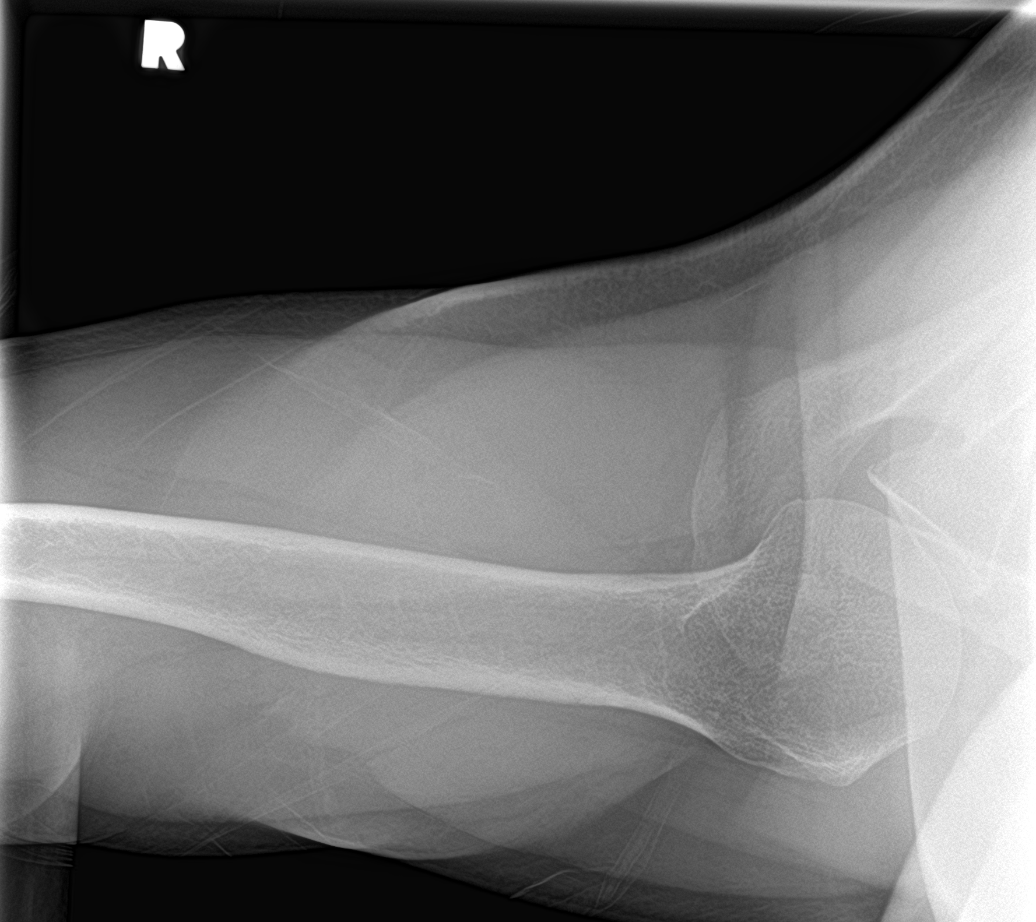

[shoulder ap neutral]
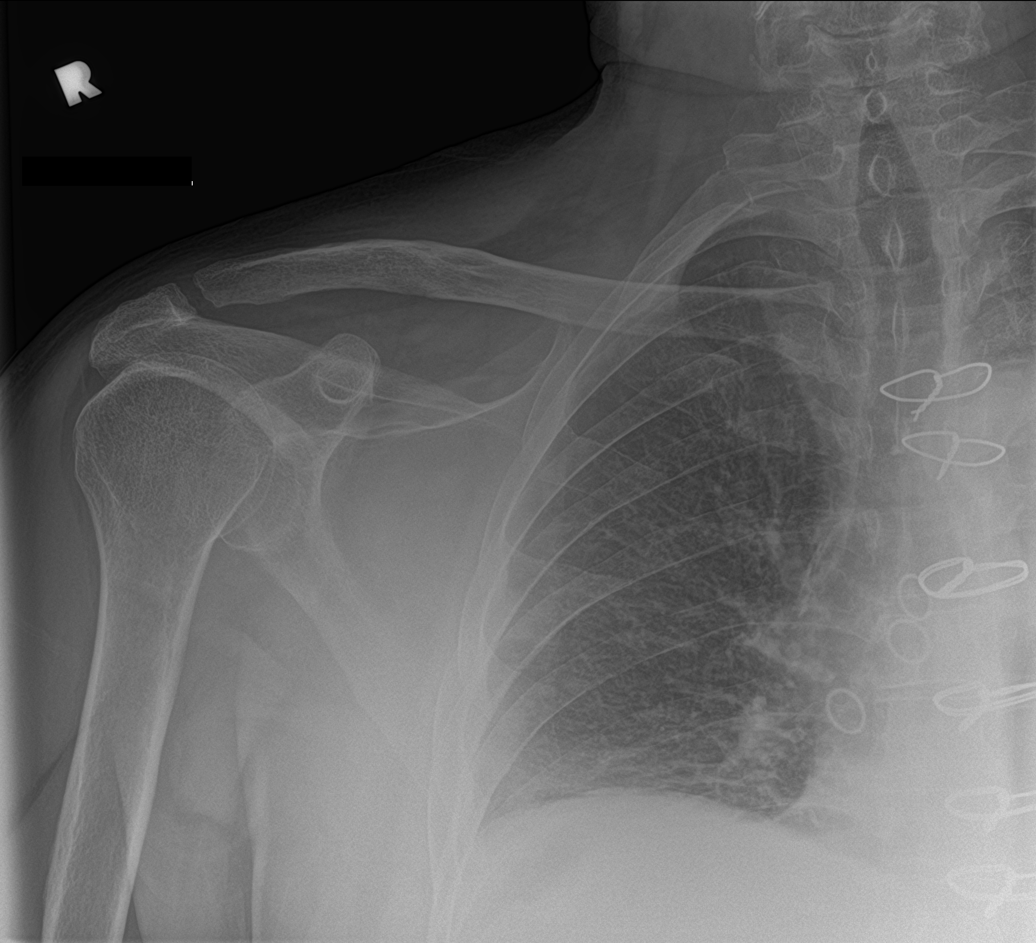

[4 of 4 positions shown; findings below may reference images not displayed]

FINDINGS: There is no evidence of fracture or dislocation. There is no
evidence of arthropathy or other focal bone abnormality. Soft
tissues are unremarkable.
IMPRESSION: Negative.

## 2022-02-13 NOTE — Progress Notes (Signed)
Acute Office Visit  Subjective:    Patient ID: Lonnie [name], male    DOB: 1951-09-22, 71 y.o.   MRN: [id number]  Chief Complaint  Patient presents with   Fall    Fall this morning Right shoulder hurts    Fall  Patient is in today for right shoulder pain.  This morning patient fell trying to transfer from commode and hit his right shoulder. Reports pain is 8-9/10 with extending arm overhead, but only 2/10 at rest. He has not had any shoulder swelling, bruising, radiation of pain, numbness, tingling.    Past Medical History:  Diagnosis Date   Allergy    Anxiety    Arthritis    left neck, shoulder, knee   CAD (coronary artery disease)    a. anterior STEMI 10/2013 s/p 4V CABG with LIMA to mid LAD, SVG to OM, SVG to PDA, SVG to Diagonal.   Chronic combined systolic and diastolic CHF (congestive heart failure) ([place])    Dementia ([place])    Depression    Falls    Hypertension    Ischemic cardiomyopathy    a. EF 40-45% at time of CABG and in 2018.   MI (myocardial infarction) ([place])    Peripheral neuropathy    Prediabetes 09/01/2014   PVD (peripheral vascular disease) ([place])    a. s/p L SFA stents with now known bilateral SFA. b. right femoral to below the knee bypass by Dr. Trula Slade in 12/2019, c/b infection 02/2020.   Stroke [facility])    seen on CT Scan   Subdural hematoma    Tobacco abuse    UTI (urinary tract infection)     Past Surgical History:  Procedure Laterality Date   ABDOMINAL AORTAGRAM N/A 06/07/2014   Procedure: ABDOMINAL Maxcine Ham;  Surgeon: Wellington Hampshire, MD;  Location: [place] CATH LAB;  Service: Cardiovascular;  Laterality: N/A;   ABDOMINAL AORTAGRAM N/A 03/07/2015   Procedure: ABDOMINAL Maxcine Ham;  Surgeon: Wellington Hampshire, MD;  Location: [place] CATH [facility];  Service: Cardiovascular;  Laterality: N/A;   ABDOMINAL AORTOGRAM W/LOWER EXTREMITY N/A 01/18/2020   Procedure: ABDOMINAL AORTOGRAM W/LOWER EXTREMITY - Right;  Surgeon: Wellington Hampshire, MD;  Location: [place] CV LAB;  Service: Cardiovascular;  Laterality: N/A;   ABDOMINAL AORTOGRAM W/LOWER EXTREMITY Left 12/31/2021   Procedure: ABDOMINAL AORTOGRAM W/LOWER EXTREMITY;  Surgeon: Serafina Mitchell, MD;  Location: [place] CV LAB;  Service: Cardiovascular;  Laterality: Left;   AMPUTATION Right 07/02/2021   Procedure: RIGHT ABOVE KNEE AMPUTATION;  Surgeon: Elam Dutch, MD;  Location: [place];  Service: Vascular;  Laterality: Right;   ANGIOPLASTY Right 06/08/2021   Procedure: BALLOON ANGIOPLASTY;  Surgeon: Serafina Mitchell, MD;  Location: [place];  Service: Vascular;  Laterality: Right;   APPENDECTOMY     APPLICATION OF WOUND VAC Right 06/27/2021   Procedure: APPLICATION OF WOUND VAC;  Surgeon: Marty Heck, MD;  Location: [place];  Service: Vascular;  Laterality: Right;   COLONOSCOPY WITH PROPOFOL N/A 10/11/2015   Procedure: COLONOSCOPY WITH PROPOFOL;  Surgeon: Juanita Craver, MD;  Location: [facility];  Service: Endoscopy;  Laterality: N/A;   CORONARY ARTERY BYPASS GRAFT N/A 11/04/2013   Procedure: CORONARY ARTERY BYPASS GRAFTING (CABG) TIMES FOUR  USING LEFT INTERNAL MAMMARY ARTERY AND RIGHT AND LEFT SAPHENOUS LEG VEIN HARVESTED ENDOSCOPICALLY;  Surgeon: Melrose Nakayama, MD;  Location: [place];  Service: Open Heart Surgery;  Laterality: N/A;   CRANIOTOMY Left 06/30/2019   Procedure: Frontal CRANIOTOMY HEMATOMA EVACUATION SUBDURAL;  Surgeon: Consuella Lose, MD;  Location: [place];  Service: Neurosurgery;  Laterality: Left;  Frontal CRANIOTOMY HEMATOMA EVACUATION SUBDURAL   FEMORAL-POPLITEAL BYPASS GRAFT Right 01/20/2020   Procedure: BYPASS GRAFT FEMORAL-POPLITEAL ARTERY RIGHT LEG USING GORE PROPATEN GRAFT;  Surgeon: Serafina Mitchell, MD;  Location: [place];  Service: Vascular;  Laterality: Right;   FINGER SURGERY  2017   injury   I & D EXTREMITY Right 03/02/2020   Procedure: IRRIGATION AND DEBRIDEMENT EXTREMITY right  lower leg  with Antibiotic beads.;  Surgeon: Angelia Mould, MD;   Location: [place];  Service: Vascular;  Laterality: Right;   I & D EXTREMITY Right 06/27/2021   Procedure: IRRIGATION AND DEBRIDEMENT OF RIGHT LEG;  Surgeon: Marty Heck, MD;  Location: [place];  Service: Vascular;  Laterality: Right;   INSERTION OF ILIAC STENT Right 06/08/2021   Procedure: INSERTION OF RIGHT UPPER LEG STENT;  Surgeon: Serafina Mitchell, MD;  Location: [place];  Service: Vascular;  Laterality: Right;   KNEE SURGERY     fractured patella   LEFT HEART CATH N/A 10/29/2013   Procedure: LEFT HEART CATH;  Surgeon: Burnell Blanks, MD;  Location: [facility] CATH LAB;  Service: Cardiovascular;  Laterality: N/A;   LEFT HEART CATHETERIZATION WITH CORONARY ANGIOGRAM N/A 10/31/2013   Procedure: LEFT HEART CATHETERIZATION WITH CORONARY ANGIOGRAM;  Surgeon: Burnell Blanks, MD;  Location: [facility] CATH LAB;  Service: Cardiovascular;  Laterality: N/A;   LIGATION OF CILIAC ARTERY Right 06/27/2021   Procedure: LIGATION OF POPITEAL ARTERY;  Surgeon: Marty Heck, MD;  Location: [place];  Service: Vascular;  Laterality: Right;   LOWER EXTREMITY ANGIOGRAPHY Right 06/07/2021   Procedure: Lower Extremity Angiography;  Surgeon: Elam Dutch, MD;  Location: [place] CV LAB;  Service: Cardiovascular;  Laterality: Right;  W/ ABD    MOUTH SURGERY     PATCH ANGIOPLASTY Right 07/08/2021   Procedure: PATCH ANGIOPLASTY WITH 1X6 Weyman Pedro;  Surgeon: Elam Dutch, MD;  Location: [facility] OR;  Service: Vascular;  Laterality: Right;   PERIPHERAL VASCULAR THROMBECTOMY Right 06/07/2021   Procedure: PERIPHERAL VASCULAR THROMBECTOMY;  Surgeon: Elam Dutch, MD;  Location: [place] CV LAB;  Service: Cardiovascular;  Laterality: Right;   REMOVAL OF GRAFT Right 07/08/2021   Procedure: REMOVAL RIGHT FEMORAL BYPASS GRAFT;  Surgeon: Elam Dutch, MD;  Location: [facility] OR;  Service: Vascular;  Laterality: Right;   TOE SURGERY     VEIN REPAIR Right 06/27/2021   Procedure: REPAIR OF [place];   Surgeon: Marty Heck, MD;  Location: [place] OR;  Service: Vascular;  Laterality: Right;    Family History  Problem Relation Age of Onset   CAD Father 42   AAA (abdominal aortic aneurysm) Father    Alcohol abuse Father    Hypertension Father    Diabetes Father    Heart attack Father    Stroke Mother    Arthritis Mother    Heart disease Mother    Hypertension Mother    Heart attack Mother    Cardiomyopathy Daughter    Colon cancer Neg Hx    Prostate cancer Neg Hx     Social History   Socioeconomic History   Marital status: Married    Spouse name: [name]   Number of children: 1   Years of education: Not on file   Highest education level: Not on file  Occupational History   Occupation: long term disability --Futures trader: [employer]  Use   Smoking status: Former    Packs/day: 0.25    Years: 30.00    Pack years: 7.50    Types: Cigarettes    Quit date: 05/2021    Years since quitting: 0.7   Smokeless tobacco: Former    Quit date: 05/2021   Tobacco comments:    < 1/2 ppd  Vaping Use   Vaping Use: Never used  Substance and Sexual Activity   Alcohol use: No    Alcohol/week: 0.0 standard drinks   Drug use: No   Sexual activity: Yes  Other Topics Concern   Not on file  Social History Narrative   Household-- pt and wife   1 daughter    Social Determinants of Health   Financial Resource Strain: Low Risk    Difficulty of Paying Living Expenses: Not hard at all  Food Insecurity: No Food Insecurity   Worried About Charity fundraiser in the Last Year: Never true   Arboriculturist in the Last Year: Never true  Transportation Needs: No Transportation Needs   Lack of Transportation (Medical): No   Lack of Transportation (Non-Medical): No  Physical Activity: Inactive   Days of Exercise per Week: 0 days   Minutes of Exercise per Session: 0 min  Stress: No Stress Concern Present   Feeling of Stress : Not at all  Social Connections: Moderately  Isolated   Frequency of Communication with Friends and Family: More than three times a week   Frequency of Social Gatherings with Friends and Family: More than three times a week   Attends Religious Services: Never   Marine scientist or Organizations: No   Attends Music therapist: Never   Marital Status: Married  Human resources officer Violence: Not At Risk   Fear of Current or Ex-Partner: No   Emotionally Abused: No   Physically Abused: No   Sexually Abused: No    Outpatient Medications Prior to Visit  Medication Sig Dispense Refill   acetaminophen (TYLENOL) 500 MG tablet Take 1,000 mg by mouth in the morning and at bedtime.     aspirin EC 81 MG tablet Take 81 mg by mouth in the morning.     atorvastatin (LIPITOR) 80 MG tablet TAKE 1 TABLET BY MOUTH  DAILY 90 tablet 3   Calcium Carb-Cholecalciferol (CALCIUM 600+D3 PO) Take 1 tablet by mouth daily with breakfast.     citalopram (CELEXA) 20 MG tablet Take 0.5 tablets (10 mg total) by mouth 2 (two) times daily. 90 tablet 1   clonazePAM (KLONOPIN) 0.25 MG disintegrating tablet DISSOLVE 1 TABLET ON TOP OF THE TONGUE TWICE DAILY AS  NEEDED 180 tablet 0   clopidogrel (PLAVIX) 75 MG tablet TAKE 1 TABLET BY MOUTH  DAILY (Patient taking differently: Take 75 mg by mouth every evening.) 90 tablet 1   COVID-19 mRNA bivalent vaccine, Moderna, (MODERNA COVID-19 BIVAL BOOSTER) 50 MCG/0.5ML injection Inject into the muscle. 0.5 mL 0   docusate sodium (COLACE) 100 MG capsule Take 100 mg by mouth in the morning.     Ferrous Fumarate (HEMOCYTE - 106 MG FE) 324 (106 Fe) MG TABS tablet Take 1 tablet (106 mg of iron total) by mouth daily. 90 tablet 1   furosemide (LASIX) 20 MG tablet TAKE 1 TABLET BY MOUTH DAILY AS NEEDED FOR ANKLE SWELLING (Patient taking differently: 20 mg daily.) 90 tablet 0   gabapentin (NEURONTIN) 100 MG capsule TAKE 1 CAPSULE BY MOUTH  TWICE DAILY 180 capsule 3  irbesartan (AVAPRO) 300 MG tablet TAKE 1 TABLET BY MOUTH   DAILY 90 tablet 3   melatonin 3 MG TABS tablet Take 2 mg by mouth at bedtime.     Menthol, Topical Analgesic, (PAIN RELIEVING PATCH ULTRA ST EX) Apply 1 patch topically daily as needed (pain).     metoprolol tartrate (LOPRESSOR) 25 MG tablet TAKE ONE-HALF TABLET BY  MOUTH TWICE DAILY 90 tablet 1   Multiple Vitamin (MULTIVITAMIN WITH MINERALS) TABS tablet Take 1 tablet by mouth in the morning.     nicotine (NICODERM CQ - DOSED IN MG/24 HR) 7 mg/24hr patch Place 1 patch (7 mg total) onto the skin daily as needed. 30 patch 0   oxycodone (OXY-IR) 5 MG capsule Take 1 capsule (5 mg total) by mouth every 6 (six) hours as needed. 20 capsule 0   pantoprazole (PROTONIX) 40 MG tablet TAKE 1 TABLET BY MOUTH AT  BEDTIME 90 tablet 1   QUEtiapine (SEROQUEL) 200 MG tablet TAKE 1 TABLET BY MOUTH  TWICE DAILY 180 tablet 3   tamsulosin (FLOMAX) 0.4 MG CAPS capsule TAKE 2 CAPSULES BY MOUTH  DAILY AFTER SUPPER 180 capsule 1   No facility-administered medications prior to visit.    Allergies  Allergen Reactions   Ace Inhibitors Swelling and Other (See Comments)    Angioedema   Eggs Or Egg-Derived Products Nausea Only and Other (See Comments)    Cannot eat prepared eggs- Occasional nausea   Lactose Intolerance (Gi) Diarrhea, Nausea Only and Other (See Comments)    Flatulence, also   Latex Itching    Review of Systems All review of systems negative except what is listed in the HPI     Objective:    Physical Exam Vitals reviewed.  Constitutional:      Appearance: Normal appearance.  Musculoskeletal:        General: No swelling.     Comments: Right shoulder with slow/limited ROM due to pain, worse with Neers, no pain on bony prominence palpation, -Hawkins and Apleys scratch test  Skin:    Findings: No bruising or erythema.  Neurological:     General: No focal deficit present.     Mental Status: He is alert and oriented to person, place, and time. Mental status is at baseline.  Psychiatric:         Mood and Affect: Mood normal.        Behavior: Behavior normal.        Thought Content: Thought content normal.        Judgment: Judgment normal.    BP (!) 144/69 (BP Location: Left Arm, Patient Position: Sitting, Cuff Size: Normal)    Pulse (!) 58    Resp 20    Ht 5\' 11"  (1.803 m)    Wt 190 lb (86.2 kg)    SpO2 98%    BMI 26.50 kg/m  Wt Readings from Last 3 Encounters:  02/13/22 190 lb (86.2 kg)  12/31/21 190 lb (86.2 kg)  12/11/21 205 lb (93 kg)    Health Maintenance Due  Topic Date Due   Zoster Vaccines- Shingrix (1 of 2) Never done   Pneumonia Vaccine 67+ Years old (3 - PPSV23 if available, else PCV20) 09/11/2020    There are no preventive care reminders to display for this patient.   Lab Results  Component Value Date   TSH 1.28 04/26/2020   Lab Results  Component Value Date   WBC 9.3 08/13/2021   HGB 12.6 (L) 12/31/2021   HCT  37.0 (L) 12/31/2021   MCV 75.9 (L) 08/13/2021   PLT 346.0 08/13/2021   Lab Results  Component Value Date   NA 141 12/31/2021   K 4.2 12/31/2021   CO2 26 10/10/2021   GLUCOSE 107 (H) 12/31/2021   BUN 24 (H) 12/31/2021   CREATININE 1.20 12/31/2021   BILITOT 0.5 08/13/2021   ALKPHOS 132 (H) 08/13/2021   AST 24 08/13/2021   ALT 19 08/13/2021   PROT 6.5 08/13/2021   ALBUMIN 3.7 08/13/2021   CALCIUM 9.7 10/10/2021   ANIONGAP 5 07/10/2021   GFR 69.82 10/10/2021   Lab Results  Component Value Date   CHOL 91 05/24/2021   Lab Results  Component Value Date   HDL 28 (L) 05/24/2021   Lab Results  Component Value Date   LDLCALC 44 05/24/2021   Lab Results  Component Value Date   TRIG 106 05/24/2021   Lab Results  Component Value Date   CHOLHDL 3.3 05/24/2021   Lab Results  Component Value Date   HGBA1C 6.2 (H) 05/24/2021       Assessment & Plan:   1. Fall, initial encounter 2. Acute pain of right shoulder Xray today.  Given weakness on exam, discussed starting with sports med referral, but he would like to see how the  weekend goes first. Rest, Ice, extra strength Tylenol, lidocaine patches, etc.  Avoid NSAIDs (ibuprofen, Aleve, etc. ) since you are on blood thinners Xray today - we will let you know results - DG Shoulder Right; Future   Please let us know if not starting to see some improvement after the weekend or if weakness/pain persists so we can refer to sports medicine. Go to the ED for severe pain.    Terrilyn Saver, NP

## 2022-02-13 NOTE — Patient Instructions (Signed)
Rest, Ice, extra strength Tylenol, lidocaine patches, etc.  Avoid NSAIDs (ibuprofen, Aleve, etc. ) since you are on blood thinners Xray today - we will let you know results Please let us know if not starting to see some improvement after the weekend or if weakness/pain persists so we can refer to sports medicine.

## 2022-02-14 ENCOUNTER — Telehealth: Payer: Self-pay

## 2022-02-14 DIAGNOSIS — Z8673 Personal history of transient ischemic attack (TIA), and cerebral infarction without residual deficits: Secondary | ICD-10-CM

## 2022-02-14 DIAGNOSIS — G629 Polyneuropathy, unspecified: Secondary | ICD-10-CM

## 2022-02-14 DIAGNOSIS — M150 Primary generalized (osteo)arthritis: Secondary | ICD-10-CM | POA: Diagnosis not present

## 2022-02-14 DIAGNOSIS — I5042 Chronic combined systolic (congestive) and diastolic (congestive) heart failure: Secondary | ICD-10-CM

## 2022-02-14 DIAGNOSIS — I252 Old myocardial infarction: Secondary | ICD-10-CM

## 2022-02-14 DIAGNOSIS — Z87440 Personal history of urinary (tract) infections: Secondary | ICD-10-CM

## 2022-02-14 DIAGNOSIS — Z89611 Acquired absence of right leg above knee: Secondary | ICD-10-CM

## 2022-02-14 DIAGNOSIS — Z7902 Long term (current) use of antithrombotics/antiplatelets: Secondary | ICD-10-CM

## 2022-02-14 DIAGNOSIS — J449 Chronic obstructive pulmonary disease, unspecified: Secondary | ICD-10-CM

## 2022-02-14 DIAGNOSIS — F0393 Unspecified dementia, unspecified severity, with mood disturbance: Secondary | ICD-10-CM

## 2022-02-14 DIAGNOSIS — Z9181 History of falling: Secondary | ICD-10-CM

## 2022-02-14 DIAGNOSIS — F0394 Unspecified dementia, unspecified severity, with anxiety: Secondary | ICD-10-CM

## 2022-02-14 DIAGNOSIS — I739 Peripheral vascular disease, unspecified: Secondary | ICD-10-CM

## 2022-02-14 DIAGNOSIS — K219 Gastro-esophageal reflux disease without esophagitis: Secondary | ICD-10-CM

## 2022-02-14 DIAGNOSIS — F32A Depression, unspecified: Secondary | ICD-10-CM

## 2022-02-14 DIAGNOSIS — I110 Hypertensive heart disease with heart failure: Secondary | ICD-10-CM

## 2022-02-14 DIAGNOSIS — R7303 Prediabetes: Secondary | ICD-10-CM | POA: Diagnosis not present

## 2022-02-14 DIAGNOSIS — I255 Ischemic cardiomyopathy: Secondary | ICD-10-CM

## 2022-02-14 DIAGNOSIS — Z993 Dependence on wheelchair: Secondary | ICD-10-CM

## 2022-02-14 DIAGNOSIS — Z720 Tobacco use: Secondary | ICD-10-CM

## 2022-02-14 DIAGNOSIS — I2510 Atherosclerotic heart disease of native coronary artery without angina pectoris: Secondary | ICD-10-CM | POA: Diagnosis not present

## 2022-02-14 NOTE — Telephone Encounter (Signed)
[facility] [phone]. Form sent for scanning.

## 2022-02-15 ENCOUNTER — Encounter: Payer: Self-pay | Admitting: Internal Medicine

## 2022-02-26 ENCOUNTER — Other Ambulatory Visit: Payer: Self-pay

## 2022-02-26 DIAGNOSIS — Z515 Encounter for palliative care: Secondary | ICD-10-CM

## 2022-02-26 NOTE — Progress Notes (Signed)
COMMUNITY PALLIATIVE CARE SW NOTE ? ?PATIENT NAME: Lonnie [name] ?DOB: [birth year] ?MRN: [id number] ? ?PRIMARY CARE PROVIDER: [name], MD ? ?RESPONSIBLE PARTY:  ?Acct ID - Guarantor Home Phone Work Phone Relationship Acct Type  ?[id number] Kirill Chatterjee,* [phone]  Self P/F  ?   [address], [place], [place] [address]  ? ?Due to the COVID-19 crisis, this virtual check-in visit was done via telephone from my office and it was initiated and consent by this patient and or family. ? ?SOCIAL WORK TELEPHONIC ENCOUNTER (4:51 pm-5:10 pm) ? ?PC SW completed a telephonic encounter with patient to assess patient status, needs and provide support. SW spoke wit patient's daughter-[name]. She advised that patient has been increasingly confused where he is having difficulty following commands. Due to his inability to follow commands and confusion he is attempting to get up and is falling. He has had several falls in the past few weeks. [name] advised that patient's care is getting to be difficult for she and her mother and causing stress. They both suffer from heart issues and she worry that the stress of caregiving is affecting them. She is requesting SW assistance in placing patient in a memory care. SW provided her a list of facilities to consider. SW advised her that she will also discuss this with Dr. Renato Gails to update her and also ask her to assist with placement.  ?No other concerns noted.  ? ?Clydia Llano, LCSW ? ?

## 2022-02-28 ENCOUNTER — Other Ambulatory Visit: Payer: Self-pay

## 2022-02-28 ENCOUNTER — Telehealth: Payer: Self-pay | Admitting: Internal Medicine

## 2022-02-28 DIAGNOSIS — M25473 Effusion, unspecified ankle: Secondary | ICD-10-CM

## 2022-02-28 MED ORDER — GABAPENTIN 100 MG PO CAPS: 100.0000 mg | ORAL_CAPSULE | Freq: Two times a day (BID) | ORAL | 1 refills | Status: DC

## 2022-02-28 MED ORDER — CLOPIDOGREL BISULFATE 75 MG PO TABS: 75.0000 mg | ORAL_TABLET | Freq: Every day | ORAL | 1 refills | Status: DC

## 2022-02-28 MED ORDER — QUETIAPINE FUMARATE 200 MG PO TABS: 200.0000 mg | ORAL_TABLET | Freq: Two times a day (BID) | ORAL | 1 refills | Status: DC

## 2022-02-28 MED ORDER — TAMSULOSIN HCL 0.4 MG PO CAPS: 0.4000 mg | ORAL_CAPSULE | Freq: Every day | ORAL | 1 refills | Status: DC

## 2022-02-28 MED ORDER — IRBESARTAN 300 MG PO TABS: 300.0000 mg | ORAL_TABLET | Freq: Every day | ORAL | 1 refills | Status: DC

## 2022-02-28 MED ORDER — ATORVASTATIN CALCIUM 80 MG PO TABS: 80.0000 mg | ORAL_TABLET | Freq: Every day | ORAL | 1 refills | Status: DC

## 2022-02-28 MED ORDER — CITALOPRAM HYDROBROMIDE 20 MG PO TABS: 10.0000 mg | ORAL_TABLET | Freq: Two times a day (BID) | ORAL | 0 refills | Status: DC

## 2022-02-28 MED ORDER — FUROSEMIDE 20 MG PO TABS: 20.0000 mg | ORAL_TABLET | Freq: Every day | ORAL | 0 refills | Status: DC | PRN

## 2022-02-28 NOTE — Telephone Encounter (Signed)
Noted, thank you

## 2022-02-28 NOTE — Telephone Encounter (Signed)
Nurse from [facility] wanted document pt fell yesterday morning. He had forgotten his leg was amputated. She did evaluate him and there was no visible injury and did not complain of any pain. Wife was able to get him up and ambulance did not have to be called.  ?

## 2022-02-28 NOTE — Telephone Encounter (Signed)
Pt is will be out of rx and is requesting a week supply sent to [facility] pharmacy, and then they will order more through the usually mail service. Please advise.  ? ?Medication: citalopram (CELEXA) 20 MG tablet  ? ?Has the patient contacted their pharmacy? No. ? ? ?Preferred Pharmacy: [facility] ?[address] [place], [place] [address] ?[phone] ?

## 2022-02-28 NOTE — Telephone Encounter (Signed)
15 day supply sent.  

## 2022-02-28 NOTE — Telephone Encounter (Signed)
Just an FYI

## 2022-03-03 ENCOUNTER — Ambulatory Visit: Payer: Medicare Other | Admitting: Surgery

## 2022-03-03 ENCOUNTER — Telehealth: Payer: Self-pay | Admitting: Internal Medicine

## 2022-03-03 NOTE — Telephone Encounter (Signed)
Noted  

## 2022-03-03 NOTE — Telephone Encounter (Signed)
Pt has had insurance change.. ? ?[facility] will have to discharge him under old insurance and readmit him underneath new  ? ? ?Marella Bile [facility] [id number] ?

## 2022-03-07 ENCOUNTER — Telehealth: Payer: Self-pay

## 2022-03-07 NOTE — Telephone Encounter (Signed)
Spoke w/ Corrie Dandy- verbal orders given. Made her aware that Pt has been on citalopram and Seroquel for awhile.  ?

## 2022-03-07 NOTE — Telephone Encounter (Signed)
[facility] (due to change in insurance) need Verbal orders for Nursing once a week x 2 weeks.  ? ?Also potential level 2 medication interaction: Citalopram and Seroquel. ? ?CB numberCorrie Dandy: [phone] ?

## 2022-03-07 NOTE — Telephone Encounter (Signed)
-   Okay verbal orders for nursing. ?- Has taken citalopram and Seroquel for a while.  Continue the same ?

## 2022-03-12 ENCOUNTER — Telehealth: Payer: Self-pay | Admitting: Internal Medicine

## 2022-03-12 NOTE — Telephone Encounter (Signed)
Please send all medication to [facility] pharmacy due to an insurance change.  ? ?Fax [phone]. ?

## 2022-03-13 NOTE — Telephone Encounter (Signed)
Spoke to pts daughter who takes care of pts medication, explained to her that since the medications had already be sent on 03/10 she would have to wait until the meds are about to be out for [place] to refill it again with [facility]. She stated she understood and will call when it's time to refill.  ?

## 2022-03-14 ENCOUNTER — Telehealth: Payer: Self-pay

## 2022-03-14 NOTE — Telephone Encounter (Signed)
Plan of care signed and faxed to [facility] at [phone]. Form sent for scanning.  ?

## 2022-03-17 ENCOUNTER — Telehealth: Payer: Self-pay | Admitting: Internal Medicine

## 2022-03-17 DIAGNOSIS — M25473 Effusion, unspecified ankle: Secondary | ICD-10-CM

## 2022-03-17 MED ORDER — PANTOPRAZOLE SODIUM 40 MG PO TBEC: 40.0000 mg | DELAYED_RELEASE_TABLET | Freq: Every day | ORAL | 1 refills | Status: DC

## 2022-03-17 MED ORDER — METOPROLOL TARTRATE 25 MG PO TABS: 12.5000 mg | ORAL_TABLET | Freq: Two times a day (BID) | ORAL | 1 refills | Status: DC

## 2022-03-17 MED ORDER — QUETIAPINE FUMARATE 200 MG PO TABS: 200.0000 mg | ORAL_TABLET | Freq: Two times a day (BID) | ORAL | 1 refills | Status: DC

## 2022-03-17 MED ORDER — ATORVASTATIN CALCIUM 80 MG PO TABS: 80.0000 mg | ORAL_TABLET | Freq: Every day | ORAL | 1 refills | Status: DC

## 2022-03-17 MED ORDER — GABAPENTIN 100 MG PO CAPS: 100.0000 mg | ORAL_CAPSULE | Freq: Two times a day (BID) | ORAL | 1 refills | Status: DC

## 2022-03-17 MED ORDER — CITALOPRAM HYDROBROMIDE 20 MG PO TABS: 10.0000 mg | ORAL_TABLET | Freq: Two times a day (BID) | ORAL | 1 refills | Status: DC

## 2022-03-17 MED ORDER — IRBESARTAN 300 MG PO TABS: 300.0000 mg | ORAL_TABLET | Freq: Every day | ORAL | 1 refills | Status: DC

## 2022-03-17 MED ORDER — TAMSULOSIN HCL 0.4 MG PO CAPS: 0.4000 mg | ORAL_CAPSULE | Freq: Every day | ORAL | 1 refills | Status: AC

## 2022-03-17 MED ORDER — CLOPIDOGREL BISULFATE 75 MG PO TABS: 75.0000 mg | ORAL_TABLET | Freq: Every day | ORAL | 1 refills | Status: DC

## 2022-03-17 MED ORDER — FUROSEMIDE 20 MG PO TABS: 20.0000 mg | ORAL_TABLET | Freq: Every day | ORAL | 0 refills | Status: DC | PRN

## 2022-03-17 NOTE — Telephone Encounter (Signed)
Pt's daughter calling stating that her father's pharmacy has moved. His refills should go to [facility]  ?[address]  ?[place] [address]  ? ?[phone].Marland Kitchen  ?The refills that was sent on  03/10 to [facility] Was never sent to them.  ? ?Please advise when med can be resent.  ?Thanks  ? ?  ?

## 2022-03-17 NOTE — Telephone Encounter (Signed)
Rxs sent

## 2022-03-18 ENCOUNTER — Telehealth: Payer: Self-pay

## 2022-03-18 DIAGNOSIS — R350 Frequency of micturition: Secondary | ICD-10-CM

## 2022-03-18 NOTE — Telephone Encounter (Signed)
Okay UA urine culture.  DX UTI ?Again, recommend virtual visit, has not been seen in several months. ?

## 2022-03-18 NOTE — Telephone Encounter (Signed)
Pt is having frequent urination, foul smell with urine, and hard to urine. ?

## 2022-03-18 NOTE — Telephone Encounter (Signed)
Need more information.  Fever?  Chills?  Urinary symptoms?  Lethargic? ?Recommend virtual visit ?

## 2022-03-18 NOTE — Telephone Encounter (Signed)
Pt's daughter wanted to pick up urine specimen cup. ?

## 2022-03-18 NOTE — Telephone Encounter (Signed)
Needs more information- is he having urine symptoms?  ?

## 2022-03-20 ENCOUNTER — Other Ambulatory Visit (INDEPENDENT_AMBULATORY_CARE_PROVIDER_SITE_OTHER): Payer: Medicare HMO

## 2022-03-20 DIAGNOSIS — R350 Frequency of micturition: Secondary | ICD-10-CM

## 2022-03-20 LAB — URINALYSIS, ROUTINE W REFLEX MICROSCOPIC
Bilirubin Urine: NEGATIVE
Hgb urine dipstick: NEGATIVE
Leukocytes,Ua: NEGATIVE
Nitrite: POSITIVE — AB
RBC / HPF: NONE SEEN (ref 0–?)
Specific Gravity, Urine: >=1.03 — AB (ref 1.000–1.030)
Total Protein, Urine: NEGATIVE
Urine Glucose: NEGATIVE
Urobilinogen, UA: 0.2 (ref 0.0–1.0)
pH: 6 (ref 5.0–8.0)

## 2022-03-23 ENCOUNTER — Other Ambulatory Visit: Payer: Self-pay | Admitting: Internal Medicine

## 2022-03-23 LAB — URINE CULTURE
MICRO NUMBER:: 13201620
SPECIMEN QUALITY:: ADEQUATE

## 2022-03-23 MED ORDER — SULFAMETHOXAZOLE-TRIMETHOPRIM 800-160 MG PO TABS: 1.0000 | ORAL_TABLET | Freq: Two times a day (BID) | ORAL | 0 refills | Status: DC

## 2022-03-25 ENCOUNTER — Encounter: Payer: Self-pay | Admitting: Internal Medicine

## 2022-03-26 NOTE — Telephone Encounter (Signed)
PCP was answering Pt's family at the time they called. See PCP response to them . ?

## 2022-03-26 NOTE — Telephone Encounter (Signed)
Pt is still inquiring an answer. They are worried he may need to go to a hospital as he is not getting better. Please advise.  ?

## 2022-04-01 ENCOUNTER — Telehealth: Payer: Self-pay | Admitting: Pharmacist

## 2022-04-01 NOTE — Telephone Encounter (Signed)
Patient appearing on report for [employer] - Hypertension Control report due to last documented ambulatory blood pressure of 144/69 on 02/13/2022. Next appointment with PCP is not currently scheduled. Patient is followed in his home by Palliative Care and [facility] home [facility], however no recent blood pressure noted on recent visit documentations.   ? ?Outreached patient's daughter [name], to discuss hypertension control and medication management.  First outreach, [name] was on another call and aske for me to call back.  ? ?Current medications: irbesartan 300 mg by mouth once daily, metoprolol tartrate 12.5 mg by mouth twice daily, and furosemide 20 mg by mouth once daily ? ?Patient has an automated upper arm home BP machine. ? ?Current blood pressure readings: home blood pressure usually 110-120 / 70's  ? ?Current meal patterns: eats a heart healthy diet that is low in sodium - both his wife and his daughter have to eat low sodium as well.  ?Quit smoking 1 year ago.  ?Current physical activity: none ? ?Patient denies hypotensive signs and symptoms including dizziness, lightheadedness.  ?Patient has fallen in recent month due to forgetting that he had recently had amputation.  ?Patient denies hypertensive symptoms including headache, chest pain, shortness of breath ? ?Assessment/Plan: ?- Currently uncontrolled ?- - Reviewed goal blood pressure <140/90 ?- Reviewed appropriate administration of medication regimen ?- Reviewed to check blood pressure 2 to 3 times per week, document, and provide at next provider visit ?- Discussed dietary modifications, such as reduced salt intake, focus on whole grains, vegetables, lean proteins ?- Family will continue to work with Palliative care social worked, Insurance claims handler for either [employer] / short term placement versus long term placement. They are currently hoping for 14 to 30 days placement at either [facility] or [facility].  ?- will check back in 1 to 2 months.   ? ?

## 2022-04-02 ENCOUNTER — Telehealth: Payer: Self-pay

## 2022-04-02 ENCOUNTER — Emergency Department (HOSPITAL_COMMUNITY)
Admission: EM | Admit: 2022-04-02 | Discharge: 2022-04-03 | Discharge status: Against Medical Advice | Payer: Medicare HMO | Attending: Emergency Medicine | Admitting: Emergency Medicine

## 2022-04-02 DIAGNOSIS — R4781 Slurred speech: Secondary | ICD-10-CM | POA: Diagnosis not present

## 2022-04-02 DIAGNOSIS — F0390 Unspecified dementia without behavioral disturbance: Secondary | ICD-10-CM | POA: Diagnosis not present

## 2022-04-02 DIAGNOSIS — N390 Urinary tract infection, site not specified: Secondary | ICD-10-CM | POA: Diagnosis not present

## 2022-04-02 DIAGNOSIS — Z5321 Procedure and treatment not carried out due to patient leaving prior to being seen by health care provider: Secondary | ICD-10-CM | POA: Diagnosis not present

## 2022-04-02 LAB — COMPREHENSIVE METABOLIC PANEL
ALT: 28 U/L (ref 0–44)
AST: 28 U/L (ref 15–41)
Albumin: 3.9 g/dL (ref 3.5–5.0)
Alkaline Phosphatase: 99 U/L (ref 38–126)
Anion gap: 8 (ref 5–15)
BUN: 14 mg/dL (ref 8–23)
CO2: 22 mmol/L (ref 22–32)
Calcium: 9.6 mg/dL (ref 8.9–10.3)
Chloride: 108 mmol/L (ref 98–111)
Creatinine, Ser: 1.27 mg/dL — ABNORMAL HIGH (ref 0.61–1.24)
GFR, Estimated: >60 mL/min (ref >60)
Glucose, Bld: 117 mg/dL — ABNORMAL HIGH (ref 70–99)
Potassium: 3.9 mmol/L (ref 3.5–5.1)
Sodium: 138 mmol/L (ref 135–145)
Total Bilirubin: 0.5 mg/dL (ref 0.3–1.2)
Total Protein: 6.7 g/dL (ref 6.5–8.1)

## 2022-04-02 LAB — URINALYSIS, ROUTINE W REFLEX MICROSCOPIC
Bilirubin Urine: NEGATIVE
Glucose, UA: NEGATIVE mg/dL
Hgb urine dipstick: NEGATIVE
Ketones, ur: NEGATIVE mg/dL
Leukocytes,Ua: NEGATIVE
Nitrite: NEGATIVE
Protein, ur: NEGATIVE mg/dL
Specific Gravity, Urine: 1.01 (ref 1.005–1.030)
pH: 5 (ref 5.0–8.0)

## 2022-04-02 LAB — CBC WITH DIFFERENTIAL/PLATELET
Abs Immature Granulocytes: 0.08 10*3/uL — ABNORMAL HIGH (ref 0.00–0.07)
Basophils Absolute: 0.1 10*3/uL (ref 0.0–0.1)
Basophils Relative: 1 %
Eosinophils Absolute: 0.9 10*3/uL — ABNORMAL HIGH (ref 0.0–0.5)
Eosinophils Relative: 11 %
HCT: 39.2 % (ref 39.0–52.0)
Hemoglobin: 12.2 g/dL — ABNORMAL LOW (ref 13.0–17.0)
Immature Granulocytes: 1 %
Lymphocytes Relative: 35 %
Lymphs Abs: 2.8 10*3/uL (ref 0.7–4.0)
MCH: 25.5 pg — ABNORMAL LOW (ref 26.0–34.0)
MCHC: 31.1 g/dL (ref 30.0–36.0)
MCV: 82 fL (ref 80.0–100.0)
Monocytes Absolute: 0.8 10*3/uL (ref 0.1–1.0)
Monocytes Relative: 9 %
Neutro Abs: 3.5 10*3/uL (ref 1.7–7.7)
Neutrophils Relative %: 43 %
Platelets: 328 10*3/uL (ref 150–400)
RBC: 4.78 MIL/uL (ref 4.22–5.81)
RDW: 13.9 % (ref 11.5–15.5)
WBC: 8 10*3/uL (ref 4.0–10.5)
nRBC: 0 % (ref 0.0–0.2)

## 2022-04-02 NOTE — Telephone Encounter (Signed)
Lonnie [name], physical therapy called and requested another urine culture b/c  the patient is further declining. ?Recent urine culture was +, he is on appropriate antibiotics, if he is declining further, needs to be evaluated. ?Agree with advice. ? ?

## 2022-04-02 NOTE — ED Triage Notes (Signed)
Pt via [facility] from home c/o UTI, on bactrim for same x10 days, but family wants cipro. Per family, pt is not improvin & has slurred speech x2-3 days. Pt denies pain, initially refused transport but accepted after coaxing from family. ?Hx dementia, A&O4 ? ?110/70 ?HR 56 ?Pt requested no IV, meds PTA ?

## 2022-04-02 NOTE — ED Provider Triage Note (Signed)
Emergency Medicine Provider Triage Evaluation Note ? ?Lonnie [name] , a 71 y.o. male  was evaluated in triage.  Pt complains of ALOC. ? ?Review of Systems  ?Positive: No complaint ?Negative: No complaint ? ?Physical Exam  ?There were no vitals taken for this visit. ?Gen:   Awake, no distress   ?Resp:  Normal effort  ?MSK:   Moves extremities without difficulty  ?Other:   ? ?Medical Decision Making  ?Medically screening exam initiated at 4:47 PM.  Appropriate orders placed.  Lonnie [name] was informed that the remainder of the evaluation will be completed by another provider, this initial triage assessment does not replace that evaluation, and the importance of remaining in the ED until their evaluation is complete. ? ?Pt on bactrim for UTI, family noticing increase confusion and slurred speech for the past few days.  Hx is limited.  ?  ?Fayrene Helper, PA-C ?04/02/22 1651 ? ?

## 2022-04-02 NOTE — Telephone Encounter (Signed)
Spoke w/ Claiborne Billings- informed that PCP discussed w/ Pt's daughter [name] on 4/4 that Cipro is not recommended, that he was placed on Bactrim per urine culture it should be effective. Instructed Claiborne Billings that it seems he is further declining and needs to be evaluated, he may need imaging, blood work to see why he is declining. Recommended Pt be taken to ED. Claiborne Billings stated she was trying to keep Pt out of hospital as this has happened 8-9 times prior and was always a UTI, however she informed she would tell family.  ?

## 2022-04-02 NOTE — ED Notes (Signed)
Patient left on own accord °

## 2022-04-02 NOTE — ED Notes (Signed)
PT called X3 for vitals with no response 

## 2022-04-02 NOTE — Telephone Encounter (Signed)
Pt was [laced on Bactrim last week for UTI. Seems to continue to decline in health. He has had more confusion, more slurred speech, and darker urine. Facial and body symmetry look fine. All other vitals are fine. He does have some rattling in the bilateral upper lobes of lungs, no production with cough. Responded well to Cipro in the past for UTI, [facility] wonders if he will need this again?  ? ?Urine sample clean catch that was collected about 1 hr ago and placed in refrigerator. Family can bring it in if needed.  ? ?Please advise ? ? ?CB: [phone] ?

## 2022-04-03 ENCOUNTER — Encounter: Payer: Self-pay | Admitting: Internal Medicine

## 2022-04-03 ENCOUNTER — Telehealth: Payer: Self-pay | Admitting: Internal Medicine

## 2022-04-03 NOTE — Telephone Encounter (Signed)
Schedule a virtual visit at her convenience ?

## 2022-04-03 NOTE — Progress Notes (Signed)
Pt seen in ED 

## 2022-04-03 NOTE — Telephone Encounter (Signed)
Pt's daughter states pt visited the hospital yesterday and is still experiencing uti . Pt's daughter would like to speak with CMA regarding symps and meds. Please advise.  ?

## 2022-04-03 NOTE — Progress Notes (Signed)
Lonnie [name] ?Relationship To Patient Daughter ?Return Phone Number [phone] (Primary) ?Chief Complaint Urination Frequency ?Reason for Call Symptomatic / Request for Health Information ?Initial Comment Caller states her father gets frequent utis. Pts ?medication is not working. Pt has dementia and ?needs a rx for Cipro called in. ?Translation No ?Nurse Assessment ?Nurse: Arvella Nigh, RN, Lubertha Basque Date/Time (Eastern Time): 04/02/2022 8:43:19 PM ?Confirm and document reason for call. If ?symptomatic, describe symptoms. ?---Caller states her father gets frequent UTIs. Pts ?medication is not working. Pt has dementia and needs ?a RX for Cipro called in. ?Does the patient have any new or worsening ?symptoms? ---Yes ?Will a triage be completed? ---Yes ?Related visit to physician within the last 2 weeks? ---No ?Does the PT have any chronic conditions? (i.e. ?diabetes, asthma, this includes High risk factors for ?pregnancy, etc.) ?---Yes ?List chronic conditions. ---htn ?Is this a behavioral health or substance abuse call? ---No ?Guidelines ?Guideline Title Affirmed Question Affirmed Notes Nurse Date/Time (Eastern ?Time) ?Urinary Symptoms Urinating more ?frequently than usual ?(i.e., frequency) ?Arvella Nigh, RN, [place] 04/02/2022 8:46:23 ?PM ?Disp. Time (Eastern ?Time) Disposition Final User ?04/02/2022 8:50:03 PM See PCP within 24 Hours Yes Arvella Nigh, RN, Lubertha Basque ?PLEASE NOTE: All timestamps contained within this report are represented as [place] Standard Time. ?CONFIDENTIALTY NOTICE: This fax transmission is intended only for the addressee. It contains information that is legally privileged, confidential or ?otherwise protected from use or disclosure. If you are not the intended recipient, you are strictly prohibited from reviewing, disclosing, copying using ?or disseminating any of this information or taking any action in reliance on or regarding this information. If you have received this fax in error, please ?notify us  immediately by telephone so that we can arrange for its return to [place]. Phone: [phone], Toll-Free: [phone], Fax: [phone] ?Page: 2 of 2 ?Call Id: [id number] ?Caller Disagree/Comply Comply ?Caller Understands Yes ?PreDisposition Go to ED ?

## 2022-04-03 NOTE — Telephone Encounter (Addendum)
Please put the patient on my schedule tomorrow at 11:20 AM virtual visit. ? ?======= ? ?I will send the following message to the daughter ?[name], ?It is clear that your father is not doing well.  Please be aware that a urinary tract infection is not the only cause somebody his age deteriorate.  There are many other things that can affect them adversely. ? ?The urine test and actually the blood tests at emergency room done yesterday look okay.  There is no evidence of a infection now.  More antibiotics is not the answer for him as far as I can tell. ? ?This is too important to be managed via emails or quick telephone calls;  the ideal situation would have been to wait in the emergency room and be seen by a physician. ? ? ?I am willing to do a virtual visit with you and Peyton Najjar  tomorrow at 11:20 AM to discuss further the situation.  Please be aware that simply sending antibiotics may not be the outcome of the visit. ? ?I will not be in the office this afternoon ? ?Sincerely ? ?JP ? ? ?

## 2022-04-03 NOTE — Telephone Encounter (Signed)
Pt seen at ED- they checked urine and blood work-Pt eloped from ED and did not stay to be seen.  ?

## 2022-04-03 NOTE — Telephone Encounter (Signed)
Spoke to patient's daughter and she stated is it very hard for her dad to come in due to his dementia, and difficulty with transportation. She states that her dad had to go to the ER last night by ambulance and they were able to do an urine test that should be on his chart. Because of the 9 hour wait, they had to leave but the urine test was done and it showed the UTI. Pt's daughter states that her dad keeps getting constant UTI's due to the dementia and wants to further discuss a way handle this situation so a VV was made for Dr. Ethel Rana first available appt on 04/17. Since Monday is too long to wait, she would like to know if medication can be sent to her dad as soon as possible so he does not continue to have an untreated UTI. Please advise.  ?

## 2022-04-04 ENCOUNTER — Telehealth (INDEPENDENT_AMBULATORY_CARE_PROVIDER_SITE_OTHER): Payer: Medicare HMO | Admitting: Internal Medicine

## 2022-04-04 ENCOUNTER — Encounter: Payer: Self-pay | Admitting: Internal Medicine

## 2022-04-04 ENCOUNTER — Other Ambulatory Visit: Payer: Self-pay

## 2022-04-04 VITALS — Ht 71.0 in

## 2022-04-04 DIAGNOSIS — F0280 Dementia in other diseases classified elsewhere without behavioral disturbance: Secondary | ICD-10-CM | POA: Diagnosis not present

## 2022-04-04 DIAGNOSIS — G3109 Other frontotemporal dementia: Secondary | ICD-10-CM | POA: Diagnosis not present

## 2022-04-04 DIAGNOSIS — R627 Adult failure to thrive: Secondary | ICD-10-CM | POA: Diagnosis not present

## 2022-04-04 DIAGNOSIS — Z7401 Bed confinement status: Secondary | ICD-10-CM | POA: Diagnosis not present

## 2022-04-04 NOTE — Progress Notes (Signed)
? ?Subjective:  ? ? Patient ID: Lonnie [name], male    DOB: 07/01/1951, 71 y.o.   MRN: [id number] ? ?DOS:  04/04/2022 ?Type of visit - description: Virtual Visit via Video Note ? ?I connected with the above patient  by a video enabled telemedicine application and verified that I am speaking with the correct person using two identifiers. ?  ?THIS ENCOUNTER IS A VIRTUAL VISIT DUE TO COVID-19 - PATIENT WAS NOT SEEN IN THE OFFICE. PATIENT HAS CONSENTED TO VIRTUAL VISIT / TELEMEDICINE VISIT ?  ?Location of patient: home ? ?Location of provider: office ? ?Persons participating in the virtual visit: patient, provider  ? ?I discussed the limitations of evaluation and management by telemedicine and the availability of in person appointments. The patient expressed understanding and agreed to proceed. ? ?Acute ?Video visit today.Marland Kitchen ?I was able to see and talk with Lena and his daughter. ?Recently, his urine become cloudy and he had severely slurred speech. ?Please see chart for more documentation. ?I  ordered urine culture, showed Klebsiella, was treated with Bactrim. ?Eventually the patient went to the emergency room 04/02/2022, at my request as he was not better and had a slurred speech. ?Blood work was done, urine was then clear, labs okay except for slightly increased creatinine. ?They did not wait to see the ER physician. ? ?Today [place] states that his appetite is good but is having some left TMJ pain ( which is not new to him) ?He has no other concerns. ? ?We had a long conversation with his daughter [name] regards to future care. ? ?Review of Systems ?See above  ? ?Past Medical History:  ?Diagnosis Date  ? Allergy   ? Anxiety   ? Arthritis   ? left neck, shoulder, knee  ? CAD (coronary artery disease)   ? a. anterior STEMI 10/2013 s/p 4V CABG with LIMA to mid LAD, SVG to OM, SVG to PDA, SVG to Diagonal.  ? Chronic combined systolic and diastolic CHF (congestive heart failure) (HCC)   ? Dementia (HCC)   ? Depression    ? Falls   ? Hypertension   ? Ischemic cardiomyopathy   ? a. EF 40-45% at time of CABG and in 2018.  ? MI (myocardial infarction) (HCC)   ? Peripheral neuropathy   ? Prediabetes 09/01/2014  ? PVD (peripheral vascular disease) (HCC)   ? a. s/p L SFA stents with now known bilateral SFA. b. right femoral to below the knee bypass by Dr. Myra Gianotti in 12/2019, c/b infection 02/2020.  ? Stroke [facility])   ? seen on CT Scan  ? Subdural hematoma (HCC)   ? Tobacco abuse   ? UTI (urinary tract infection)   ? ? ?Past Surgical History:  ?Procedure Laterality Date  ? ABDOMINAL AORTAGRAM N/A 06/07/2014  ? Procedure: ABDOMINAL AORTAGRAM;  Surgeon: Iran Ouch, MD;  Location: [facility] CATH LAB;  Service: Cardiovascular;  Laterality: N/A;  ? ABDOMINAL AORTAGRAM N/A 03/07/2015  ? Procedure: ABDOMINAL AORTAGRAM;  Surgeon: Iran Ouch, MD;  Location: [facility] CATH LAB;  Service: Cardiovascular;  Laterality: N/A;  ? ABDOMINAL AORTOGRAM W/LOWER EXTREMITY N/A 01/18/2020  ? Procedure: ABDOMINAL AORTOGRAM W/LOWER EXTREMITY - Right;  Surgeon: Iran Ouch, MD;  Location: [facility] INVASIVE CV LAB;  Service: Cardiovascular;  Laterality: N/A;  ? ABDOMINAL AORTOGRAM W/LOWER EXTREMITY Left 12/31/2021  ? Procedure: ABDOMINAL AORTOGRAM W/LOWER EXTREMITY;  Surgeon: Nada Libman, MD;  Location: [facility] INVASIVE CV LAB;  Service: Cardiovascular;  Laterality: Left;  ? AMPUTATION Right  07/02/2021  ? Procedure: RIGHT ABOVE KNEE AMPUTATION;  Surgeon: Elam Dutch, MD;  Location: [facility] OR;  Service: Vascular;  Laterality: Right;  ? ANGIOPLASTY Right 06/08/2021  ? Procedure: BALLOON ANGIOPLASTY;  Surgeon: Serafina Mitchell, MD;  Location: [facility] OR;  Service: Vascular;  Laterality: Right;  ? APPENDECTOMY    ? APPLICATION OF WOUND VAC Right 06/27/2021  ? Procedure: APPLICATION OF WOUND VAC;  Surgeon: Marty Heck, MD;  Location: [place];  Service: Vascular;  Laterality: Right;  ? COLONOSCOPY WITH PROPOFOL N/A 10/11/2015  ? Procedure: COLONOSCOPY WITH PROPOFOL;  Surgeon: Juanita Craver, MD;  Location: [facility] ENDOSCOPY;  Service: Endoscopy;  Laterality: N/A;  ? CORONARY ARTERY BYPASS GRAFT N/A 11/04/2013  ? Procedure: CORONARY ARTERY BYPASS GRAFTING (CABG) TIMES FOUR  USING LEFT INTERNAL MAMMARY ARTERY AND RIGHT AND LEFT SAPHENOUS LEG VEIN HARVESTED ENDOSCOPICALLY;  Surgeon: Melrose Nakayama, MD;  Location: [place];  Service: Open Heart Surgery;  Laterality: N/A;  ? CRANIOTOMY Left 06/30/2019  ? Procedure: Frontal CRANIOTOMY HEMATOMA EVACUATION SUBDURAL;  Surgeon: Consuella Lose, MD;  Location: [place];  Service: Neurosurgery;  Laterality: Left;  Frontal CRANIOTOMY HEMATOMA EVACUATION SUBDURAL  ? FEMORAL-POPLITEAL BYPASS GRAFT Right 01/20/2020  ? Procedure: BYPASS GRAFT FEMORAL-POPLITEAL ARTERY RIGHT LEG USING GORE PROPATEN GRAFT;  Surgeon: Serafina Mitchell, MD;  Location: [place];  Service: Vascular;  Laterality: Right;  ? FINGER SURGERY  2017  ? injury  ? I & D EXTREMITY Right 03/02/2020  ? Procedure: IRRIGATION AND DEBRIDEMENT EXTREMITY right  lower leg  with Antibiotic beads.;  Surgeon: Angelia Mould, MD;  Location: [facility] OR;  Service: Vascular;  Laterality: Right;  ? I & D EXTREMITY Right 06/27/2021  ? Procedure: IRRIGATION AND DEBRIDEMENT OF RIGHT LEG;  Surgeon: Marty Heck, MD;  Location: [place];  Service: Vascular;  Laterality: Right;  ? INSERTION OF ILIAC STENT Right 06/08/2021  ? Procedure: INSERTION OF RIGHT UPPER LEG STENT;  Surgeon: Serafina Mitchell, MD;  Location: [facility] OR;  Service: Vascular;  Laterality: Right;  ? KNEE SURGERY    ? fractured patella  ? LEFT HEART CATH N/A 10/29/2013  ? Procedure: LEFT HEART CATH;  Surgeon: Burnell Blanks, MD;  Location: [facility] CATH LAB;  Service: Cardiovascular;  Laterality: N/A;  ? LEFT HEART CATHETERIZATION WITH CORONARY ANGIOGRAM N/A 10/31/2013  ? Procedure: LEFT HEART CATHETERIZATION WITH CORONARY ANGIOGRAM;  Surgeon: Burnell Blanks, MD;  Location: [facility] CATH LAB;  Service: Cardiovascular;  Laterality: N/A;  ? LIGATION OF CILIAC  ARTERY Right 06/27/2021  ? Procedure: LIGATION OF POPITEAL ARTERY;  Surgeon: Marty Heck, MD;  Location: [place];  Service: Vascular;  Laterality: Right;  ? LOWER EXTREMITY ANGIOGRAPHY Right 06/07/2021  ? Procedure: Lower Extremity Angiography;  Surgeon: Elam Dutch, MD;  Location: [place] CV LAB;  Service: Cardiovascular;  Laterality: Right;  W/ ABD   ? MOUTH SURGERY    ? PATCH ANGIOPLASTY Right 07/08/2021  ? Procedure: PATCH ANGIOPLASTY WITH 1X6 Weyman Pedro;  Surgeon: Elam Dutch, MD;  Location: [facility] OR;  Service: Vascular;  Laterality: Right;  ? PERIPHERAL VASCULAR THROMBECTOMY Right 06/07/2021  ? Procedure: PERIPHERAL VASCULAR THROMBECTOMY;  Surgeon: Elam Dutch, MD;  Location: [place] CV LAB;  Service: Cardiovascular;  Laterality: Right;  ? REMOVAL OF GRAFT Right 07/08/2021  ? Procedure: REMOVAL RIGHT FEMORAL BYPASS GRAFT;  Surgeon: Elam Dutch, MD;  Location: [place];  Service: Vascular;  Laterality: Right;  ? TOE SURGERY    ?  VEIN REPAIR Right 06/27/2021  ? Procedure: REPAIR OF POPITEAL VEIN;  Surgeon: Marty Heck, MD;  Location: [place];  Service: Vascular;  Laterality: Right;  ? ? ?Current Outpatient Medications  ?Medication Instructions  ? acetaminophen (TYLENOL) 1,000 mg, Oral, 2 times daily  ? aspirin EC 81 mg, Oral, Every morning  ? atorvastatin (LIPITOR) 80 mg, Oral, Daily  ? Calcium Carb-Cholecalciferol (CALCIUM 600+D3 PO) 1 tablet, Oral, Daily with breakfast  ? citalopram (CELEXA) 10 mg, Oral, 2 times daily  ? clonazePAM (KLONOPIN) 0.25 MG disintegrating tablet DISSOLVE 1 TABLET ON TOP OF THE TONGUE TWICE DAILY AS  NEEDED  ? clopidogrel (PLAVIX) 75 mg, Oral, Daily  ? docusate sodium (COLACE) 100 mg, Oral, Every morning  ? Ferrous Fumarate (HEMOCYTE - 106 MG FE) 324 (106 Fe) MG TABS tablet 106 mg of iron, Oral, Daily  ? furosemide (LASIX) 20 mg, Oral, Daily PRN  ? gabapentin (NEURONTIN) 100 mg, Oral, 2 times daily  ? irbesartan (AVAPRO) 300 mg, Oral, Daily  ?  melatonin 2 mg, Oral, Daily at bedtime  ? Menthol, Topical Analgesic, (PAIN RELIEVING PATCH ULTRA ST EX) 1 patch, Topical, Daily PRN  ? metoprolol tartrate (LOPRESSOR) 12.5 mg, Oral, 2 times daily  ? Multi

## 2022-04-04 NOTE — Assessment & Plan Note (Signed)
Dementia, caregiver fatigue, recent UTI, bedbound, failure to thrive ?The patient is essentially bedbound, 90% of the time spent in bed. ?Reportedly is taking his routine medications.Recently he developed slurred speech and cloudy urine, the daughter requested antibiotics, I asked for a urine culture, it did show a infection, he was treated with Bactrim. ?Subsequently he continued declining, went to the ER, urinalysis clear, blood work relatively stable.  They did not wait to see the doctor due to prolonged waiting time. ?I do not know if recent slurred speech was related to a TIA/stroke. ?Fortunately the daughter understood the limitations of managing Lonnie [name]'s health without the opportunity to see him in person and I recommended her to at least have visit like this one (virtual-video) rather than messages or quick phone calls  if problems arise. ?At this point, I do not see anything needed to be treated acutely, continue current meds, palliative care see him monthly, [name] reports she talked to them and they will see him more often which is completely appropriate. ?At some point I will suggest hospice rather than palliative care. ?At the end the daughter was appreciative of this visit. ?For slight increased creatinine, recommend to push fluids. ? ?

## 2022-04-06 DIAGNOSIS — H6123 Impacted cerumen, bilateral: Secondary | ICD-10-CM | POA: Diagnosis not present

## 2022-04-07 ENCOUNTER — Emergency Department (HOSPITAL_BASED_OUTPATIENT_CLINIC_OR_DEPARTMENT_OTHER): Payer: Medicare HMO

## 2022-04-07 ENCOUNTER — Encounter (HOSPITAL_BASED_OUTPATIENT_CLINIC_OR_DEPARTMENT_OTHER): Payer: Self-pay

## 2022-04-07 ENCOUNTER — Emergency Department (HOSPITAL_BASED_OUTPATIENT_CLINIC_OR_DEPARTMENT_OTHER)
Admission: EM | Admit: 2022-04-07 | Discharge: 2022-04-08 | Disposition: A | Discharge status: Home / Self Care | Payer: Medicare HMO | Attending: Emergency Medicine | Admitting: Emergency Medicine

## 2022-04-07 ENCOUNTER — Telehealth: Payer: Medicare HMO | Admitting: Internal Medicine

## 2022-04-07 ENCOUNTER — Other Ambulatory Visit: Payer: Self-pay

## 2022-04-07 DIAGNOSIS — G500 Trigeminal neuralgia: Secondary | ICD-10-CM | POA: Diagnosis not present

## 2022-04-07 DIAGNOSIS — R6884 Jaw pain: Secondary | ICD-10-CM | POA: Diagnosis not present

## 2022-04-07 DIAGNOSIS — R4781 Slurred speech: Secondary | ICD-10-CM | POA: Diagnosis not present

## 2022-04-07 DIAGNOSIS — Z7982 Long term (current) use of aspirin: Secondary | ICD-10-CM | POA: Insufficient documentation

## 2022-04-07 DIAGNOSIS — Z79899 Other long term (current) drug therapy: Secondary | ICD-10-CM | POA: Diagnosis not present

## 2022-04-07 DIAGNOSIS — R519 Headache, unspecified: Secondary | ICD-10-CM | POA: Diagnosis not present

## 2022-04-07 DIAGNOSIS — M6281 Muscle weakness (generalized): Secondary | ICD-10-CM | POA: Diagnosis not present

## 2022-04-07 DIAGNOSIS — Z91040 Latex allergy status: Secondary | ICD-10-CM | POA: Diagnosis not present

## 2022-04-07 DIAGNOSIS — R531 Weakness: Secondary | ICD-10-CM | POA: Diagnosis not present

## 2022-04-07 DIAGNOSIS — I2510 Atherosclerotic heart disease of native coronary artery without angina pectoris: Secondary | ICD-10-CM | POA: Insufficient documentation

## 2022-04-07 LAB — BASIC METABOLIC PANEL
Anion gap: 11 (ref 5–15)
BUN: 14 mg/dL (ref 8–23)
CO2: 21 mmol/L — ABNORMAL LOW (ref 22–32)
Calcium: 9.3 mg/dL (ref 8.9–10.3)
Chloride: 107 mmol/L (ref 98–111)
Creatinine, Ser: 1.02 mg/dL (ref 0.61–1.24)
GFR, Estimated: >60 mL/min (ref >60)
Glucose, Bld: 90 mg/dL (ref 70–99)
Potassium: 4.1 mmol/L (ref 3.5–5.1)
Sodium: 139 mmol/L (ref 135–145)

## 2022-04-07 LAB — URINALYSIS, ROUTINE W REFLEX MICROSCOPIC
Bilirubin Urine: NEGATIVE
Glucose, UA: NEGATIVE mg/dL
Hgb urine dipstick: NEGATIVE
Ketones, ur: NEGATIVE mg/dL
Leukocytes,Ua: NEGATIVE
Nitrite: NEGATIVE
Protein, ur: NEGATIVE mg/dL
Specific Gravity, Urine: 1.01 (ref 1.005–1.030)
pH: 5 (ref 5.0–8.0)

## 2022-04-07 LAB — CBC WITH DIFFERENTIAL/PLATELET
Abs Immature Granulocytes: 0.07 10*3/uL (ref 0.00–0.07)
Basophils Absolute: 0.1 10*3/uL (ref 0.0–0.1)
Basophils Relative: 1 %
Eosinophils Absolute: 1.2 10*3/uL — ABNORMAL HIGH (ref 0.0–0.5)
Eosinophils Relative: 12 %
HCT: 40.8 % (ref 39.0–52.0)
Hemoglobin: 12.9 g/dL — ABNORMAL LOW (ref 13.0–17.0)
Immature Granulocytes: 1 %
Lymphocytes Relative: 33 %
Lymphs Abs: 3.2 10*3/uL (ref 0.7–4.0)
MCH: 25.5 pg — ABNORMAL LOW (ref 26.0–34.0)
MCHC: 31.6 g/dL (ref 30.0–36.0)
MCV: 80.6 fL (ref 80.0–100.0)
Monocytes Absolute: 0.8 10*3/uL (ref 0.1–1.0)
Monocytes Relative: 9 %
Neutro Abs: 4.2 10*3/uL (ref 1.7–7.7)
Neutrophils Relative %: 44 %
Platelets: 310 10*3/uL (ref 150–400)
RBC: 5.06 MIL/uL (ref 4.22–5.81)
RDW: 13.9 % (ref 11.5–15.5)
WBC: 9.6 10*3/uL (ref 4.0–10.5)
nRBC: 0 % (ref 0.0–0.2)

## 2022-04-07 IMAGING — CT CT HEAD W/O CM
2 of 3 series · 14 of 47 positions shown, 17 images · non-contrast
Comparison: [DATE] CT head, [DATE] MRI

CLINICAL DATA: Left-sided head and jaw pain, concern for abscess.

EXAM:
CT HEAD WITHOUT CONTRAST
CT MAXILLOFACIAL WITHOUT CONTRAST
TECHNIQUE: Multidetector CT imaging of the head and maxillofacial structures
were performed using the standard protocol without intravenous
contrast. Multiplanar CT image reconstructions of the maxillofacial
structures were also generated.
RADIATION DOSE REDUCTION: This exam was performed according to the
departmental dose-optimization program which includes automated
exposure control, adjustment of the mA and/or kV according to
patient size and/or use of iterative reconstruction technique.

[Series 1: head wo · axial · 0.49mm/px · z∈[+986,+1116]mm · 11 of 32 slices shown, 14 images]
[im 3/32  brain]
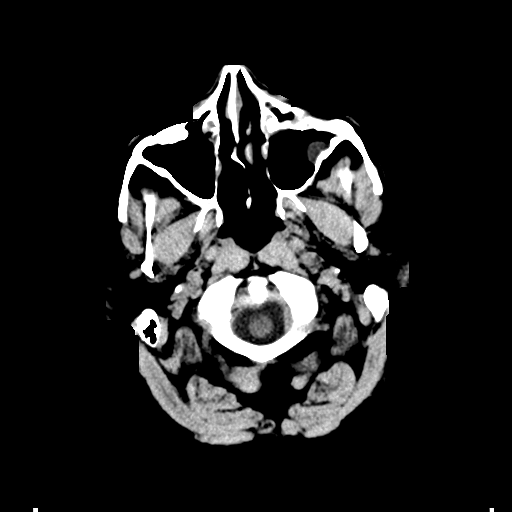
[im 3/32  bone]
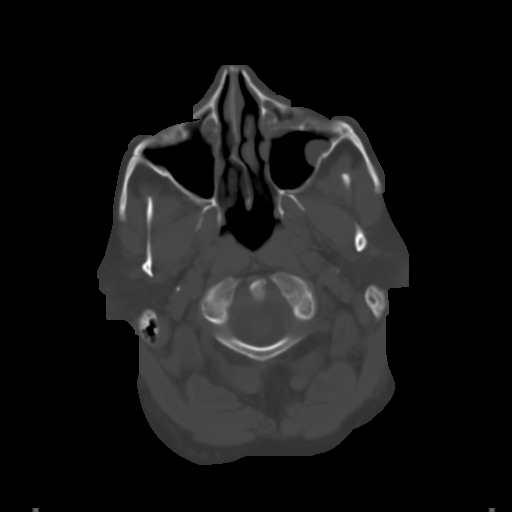
[im 5/32  brain]
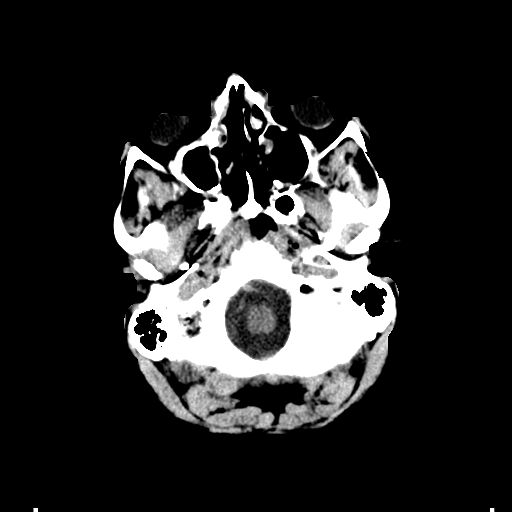
[im 8/32  brain]
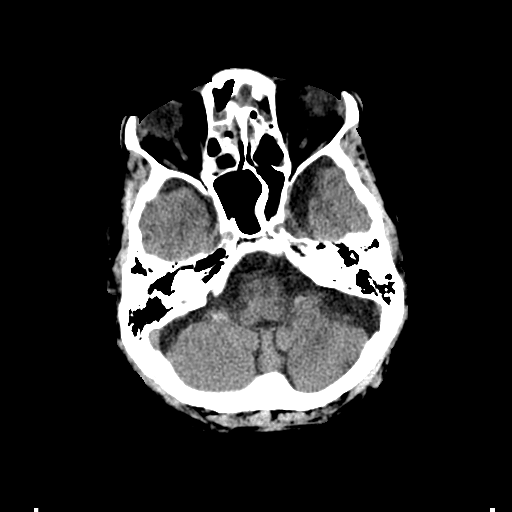
[im 10/32  brain]
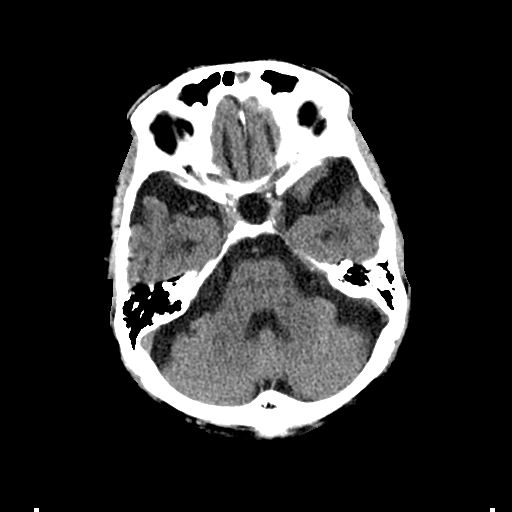
[im 13/32  brain]
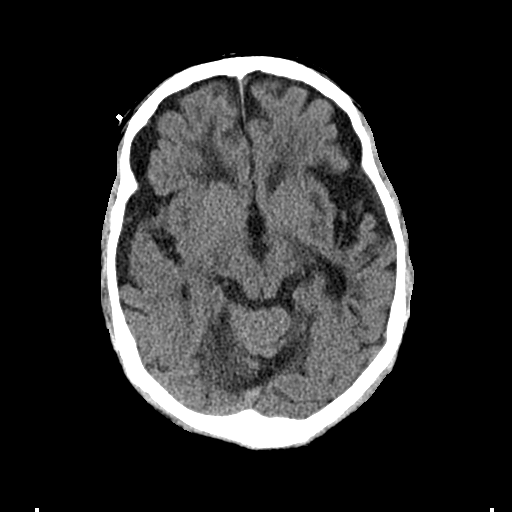
[im 13/32  bone]
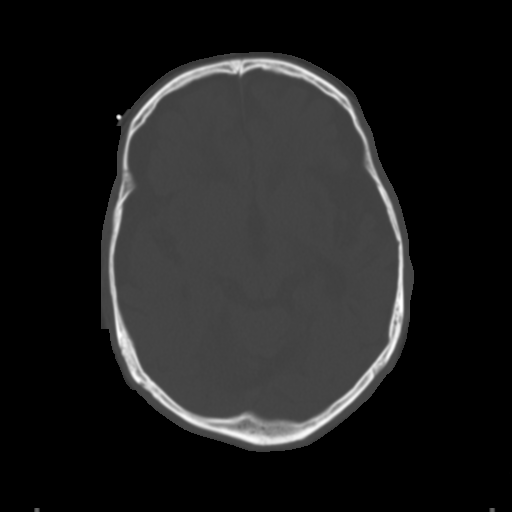
[im 17/32  brain]
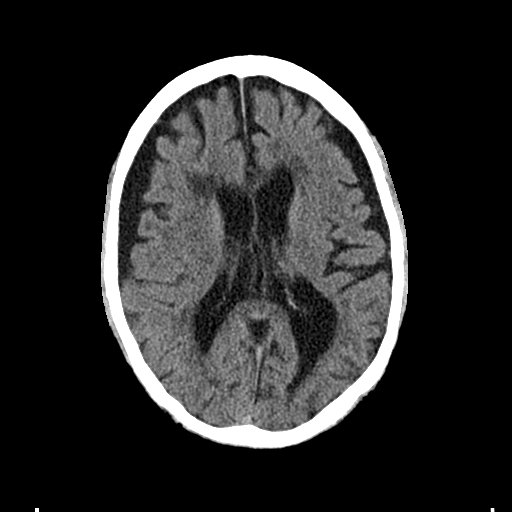
[im 19/32  brain]
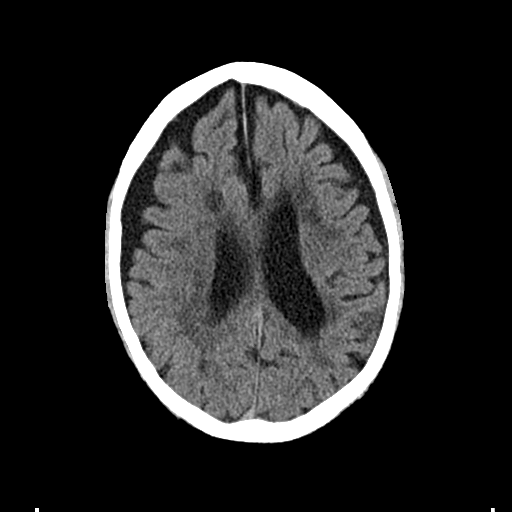
[im 22/32  brain]
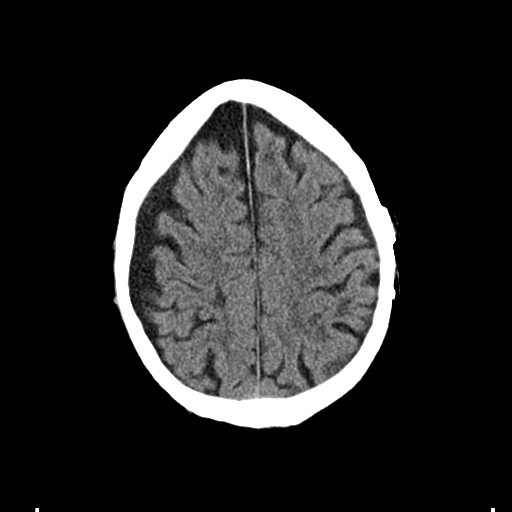
[im 24/32  brain]
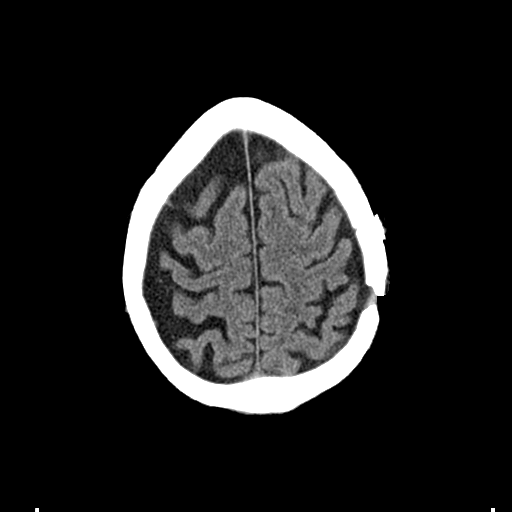
[im 24/32  bone]
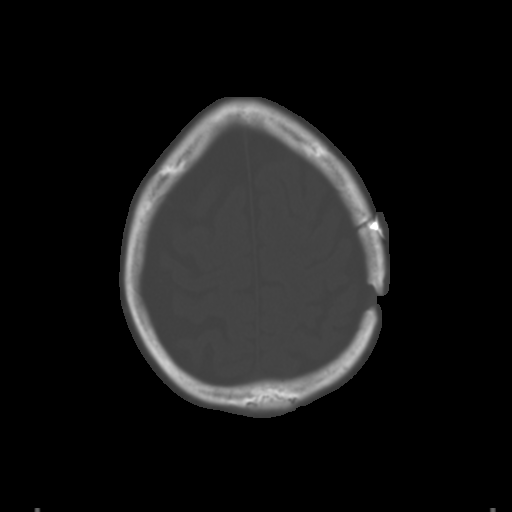
[im 27/32  brain]
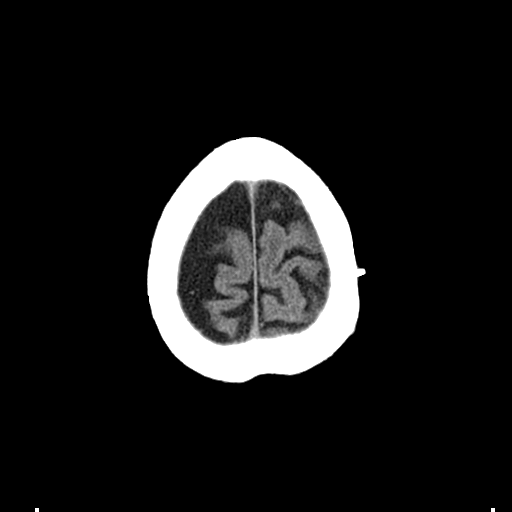
[im 29/32  brain]
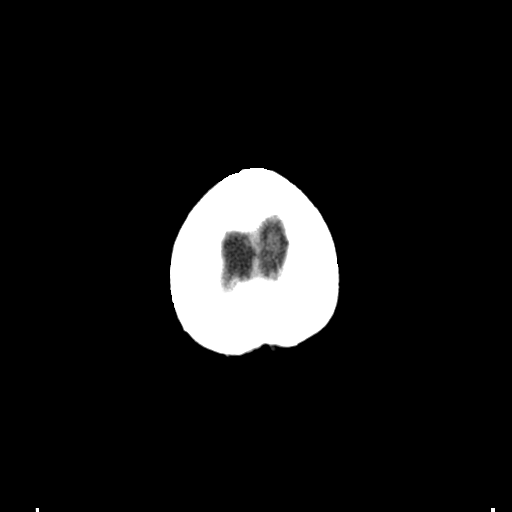

[Series 4: cor head wo · coronal · 0.30mm/px · 3 of 80 slices shown]
[im 27/80  brain]
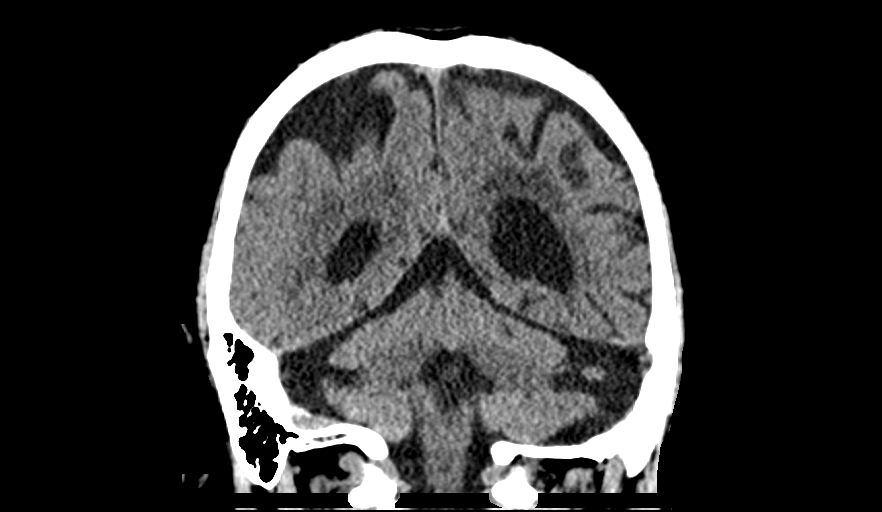
[im 36/80  brain]
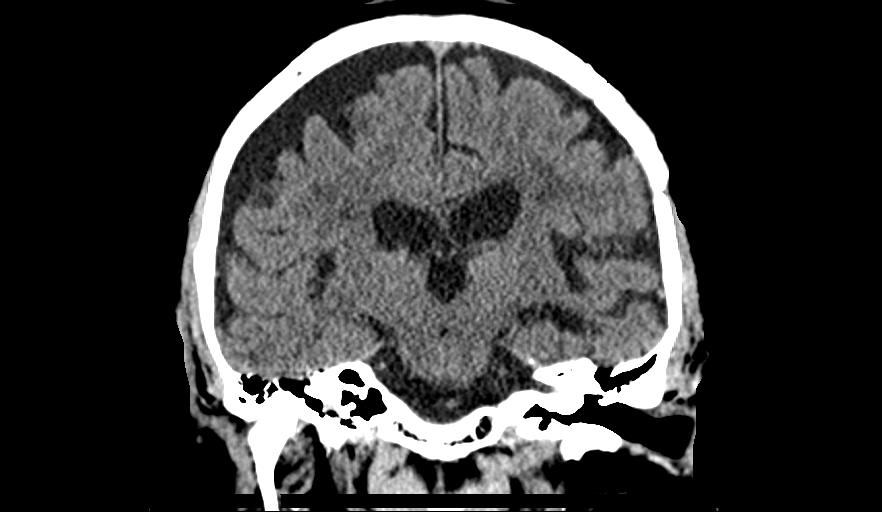
[im 44/80  brain]
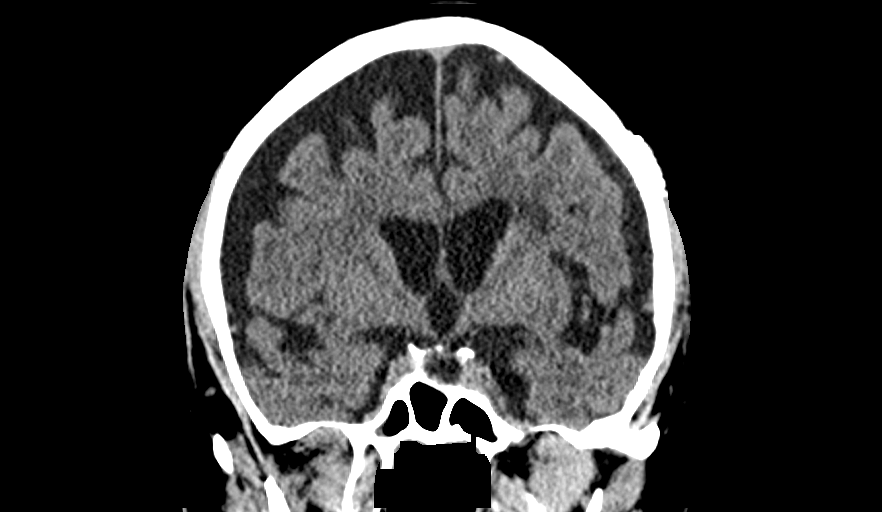

[14 of 47 positions shown; findings below may reference images not displayed]

FINDINGS: CT HEAD FINDINGS

Brain: No evidence of acute infarction, hemorrhage, cerebral edema,
mass, mass effect, or midline shift. No hydrocephalus. Unchanged
right-greater-than-left subdural hygroma. No new extra-axial fluid
collection. Periventricular white matter changes, likely the sequela
of chronic small vessel ischemic disease.

Vascular: No hyperdense vessel.

Skull: Normal. Negative for fracture or focal lesion.

CT MAXILLOFACIAL FINDINGS

Osseous: No fracture or mandibular dislocation. Periapical lucency
about the roots of a bilateral maxillary molars (series 8, image 18
and 44), with possible thinning of the inferior maxillary floor.
Multiple dental caries.

Orbits: Negative. No traumatic or inflammatory finding.

Sinuses: Diffuse mucosal thickening, most prominent in the ethmoid
air cells and inferior maxillary sinuses. The mastoids are well
aerated.

Soft tissues: Negative.
IMPRESSION: 1.  No acute intracranial process.
2. Multiple dental caries with periapical lucency about the roots of
bilateral maxillary molars, with possible thinning of the inferior
maxillary floor bilaterally. Correlate with the site of patient's
pain and consider dental referral.

## 2022-04-07 IMAGING — CT CT MAXILLOFACIAL W/O CM
3 of 4 series · 15 of 47 positions shown, 18 images · non-contrast
Comparison: [DATE] CT head, [DATE] MRI

CLINICAL DATA: Left-sided head and jaw pain, concern for abscess.

EXAM:
CT HEAD WITHOUT CONTRAST
CT MAXILLOFACIAL WITHOUT CONTRAST
TECHNIQUE: Multidetector CT imaging of the head and maxillofacial structures
were performed using the standard protocol without intravenous
contrast. Multiplanar CT image reconstructions of the maxillofacial
structures were also generated.
RADIATION DOSE REDUCTION: This exam was performed according to the
departmental dose-optimization program which includes automated
exposure control, adjustment of the mA and/or kV according to
patient size and/or use of iterative reconstruction technique.

[Series 3: max soft (person_name) · axial · 0.34mm/px · z∈[+880,+1026]mm · 11 of 85 slices shown, 14 images]
[im 6/85  brain]
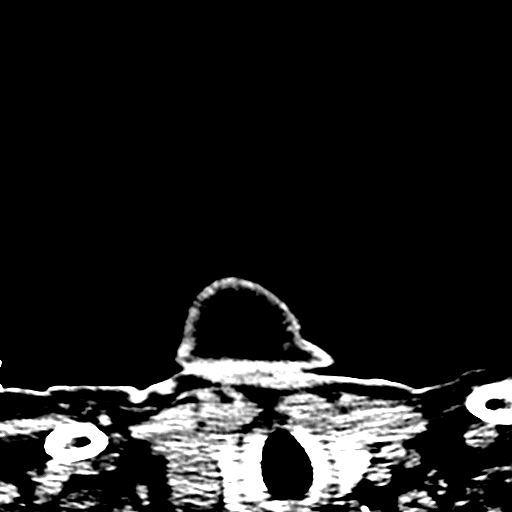
[im 6/85  bone]
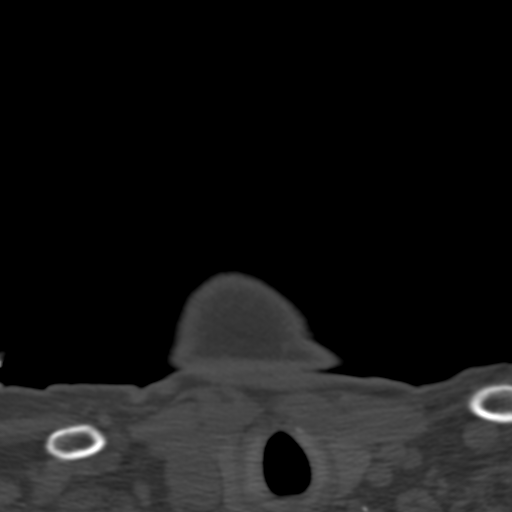
[im 12/85  bone]
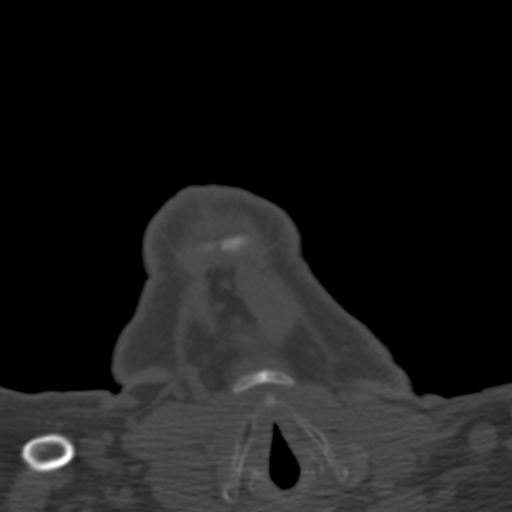
[im 21/85  bone]
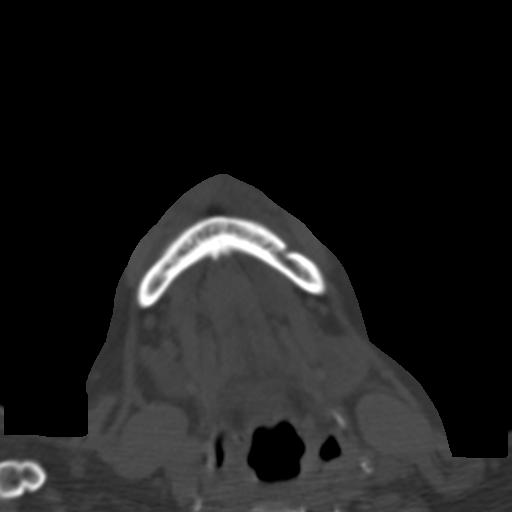
[im 27/85  bone]
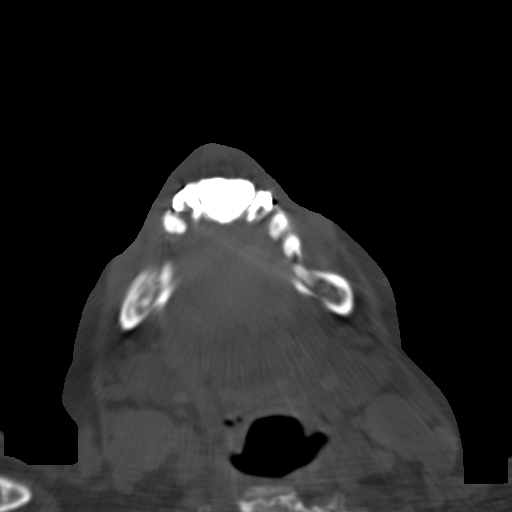
[im 35/85  brain]
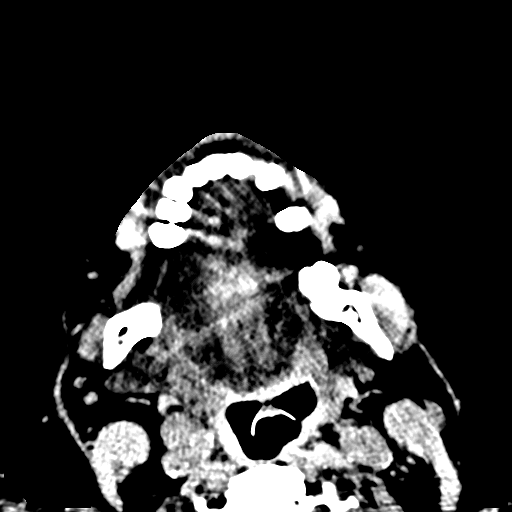
[im 35/85  bone]
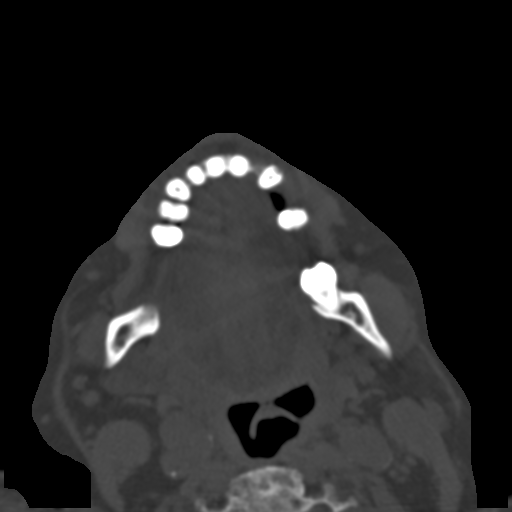
[im 44/85  bone]
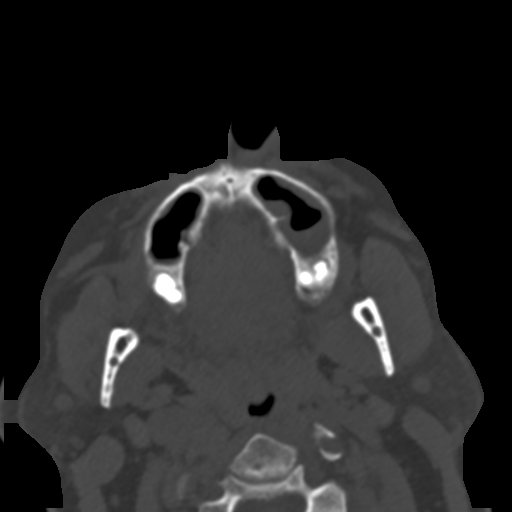
[im 50/85  bone]
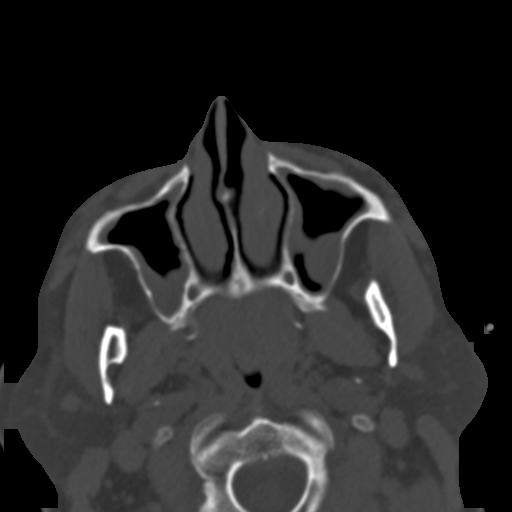
[im 58/85  bone]
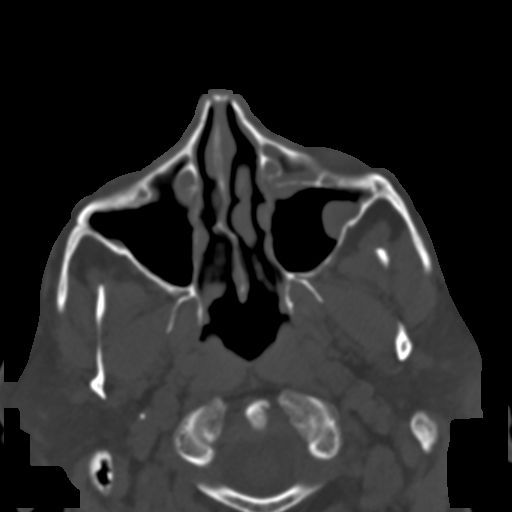
[im 64/85  brain]
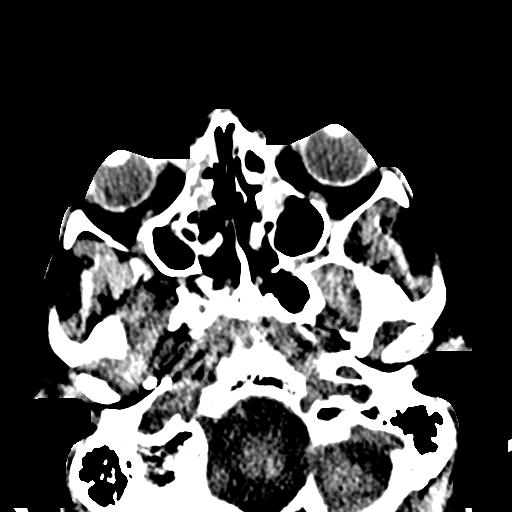
[im 64/85  bone]
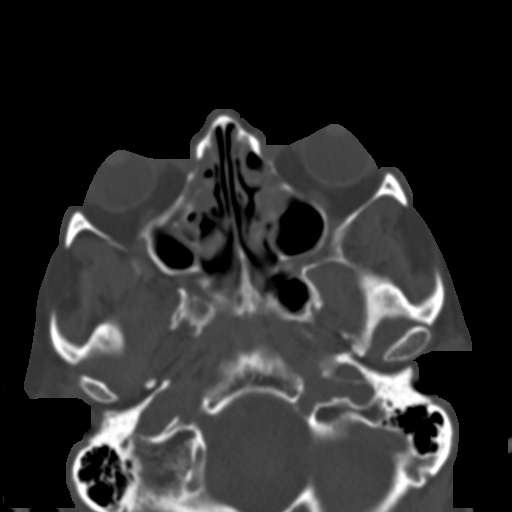
[im 73/85  bone]
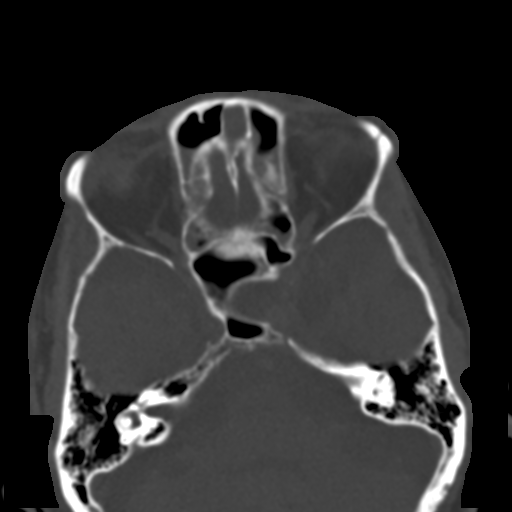
[im 79/85  bone]
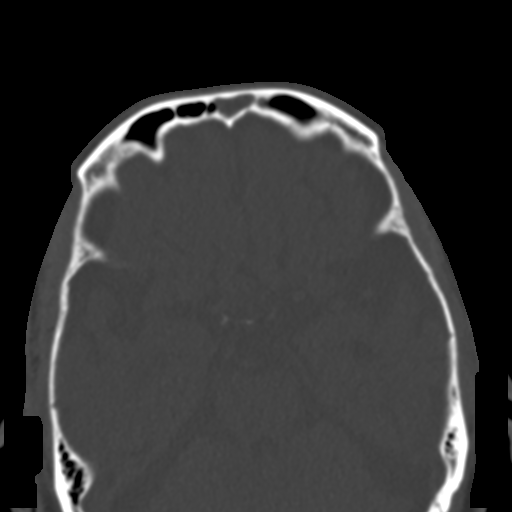

[Series 5: coronal soft · coronal · 0.38mm/px · 3 of 89 slices shown]
[im 30/89  bone]
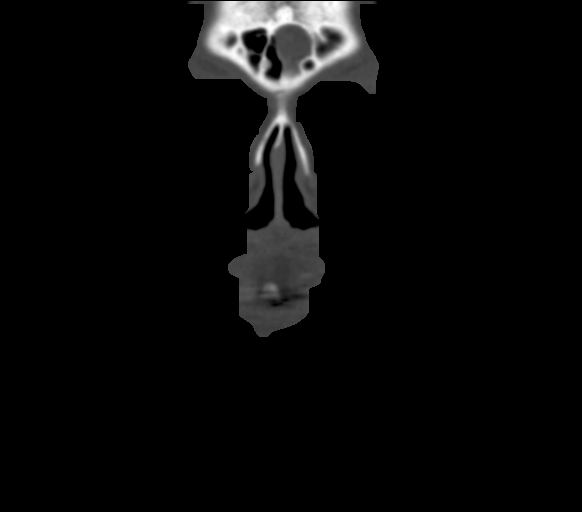
[im 40/89  bone]
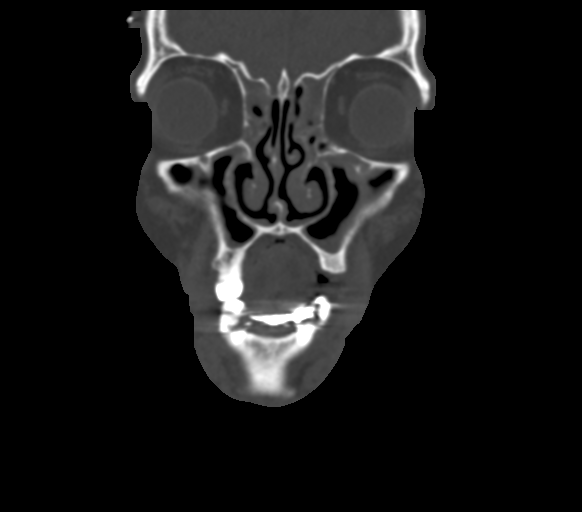
[im 49/89  bone]
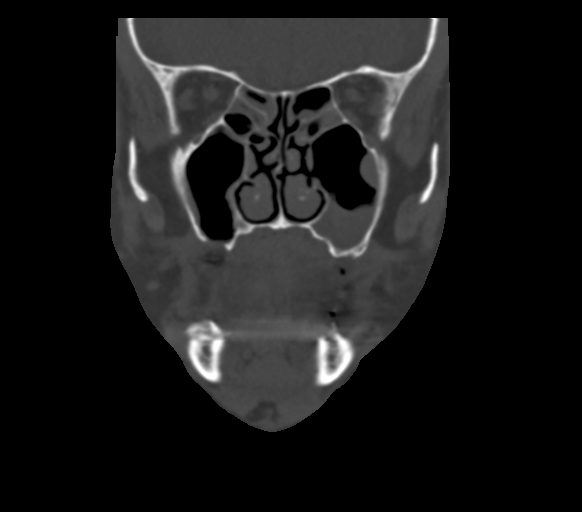

[Series 8: sagittal bone · sagittal · 0.38mm/px · 1 of 66 slices shown]
[im 33/66  bone]
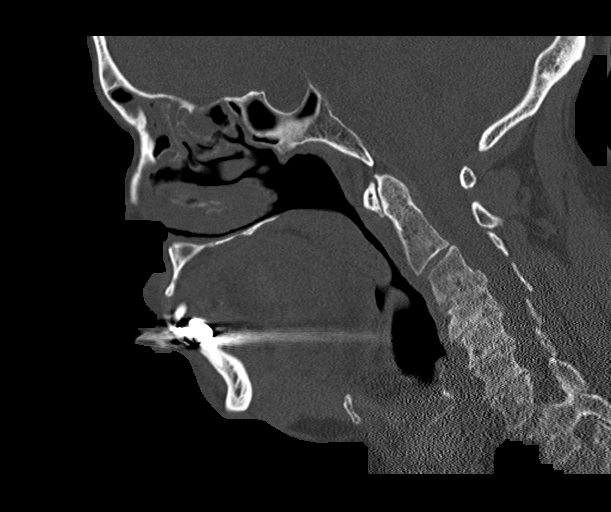

[15 of 47 positions shown; findings below may reference images not displayed]

FINDINGS: CT HEAD FINDINGS

Brain: No evidence of acute infarction, hemorrhage, cerebral edema,
mass, mass effect, or midline shift. No hydrocephalus. Unchanged
right-greater-than-left subdural hygroma. No new extra-axial fluid
collection. Periventricular white matter changes, likely the sequela
of chronic small vessel ischemic disease.

Vascular: No hyperdense vessel.

Skull: Normal. Negative for fracture or focal lesion.

CT MAXILLOFACIAL FINDINGS

Osseous: No fracture or mandibular dislocation. Periapical lucency
about the roots of a bilateral maxillary molars (series 8, image 18
and 44), with possible thinning of the inferior maxillary floor.
Multiple dental caries.

Orbits: Negative. No traumatic or inflammatory finding.

Sinuses: Diffuse mucosal thickening, most prominent in the ethmoid
air cells and inferior maxillary sinuses. The mastoids are well
aerated.

Soft tissues: Negative.
IMPRESSION: 1.  No acute intracranial process.
2. Multiple dental caries with periapical lucency about the roots of
bilateral maxillary molars, with possible thinning of the inferior
maxillary floor bilaterally. Correlate with the site of patient's
pain and consider dental referral.

## 2022-04-07 MED ORDER — SODIUM CHLORIDE 0.9 % IV SOLN
1000.0000 mg | Freq: Once | INTRAVENOUS | Status: AC
  Administered 2022-04-07: 1000 mg via INTRAVENOUS
  Filled 2022-04-07: qty 20

## 2022-04-07 MED ORDER — PHENYTOIN SODIUM 50 MG/ML IJ SOLN
INTRAMUSCULAR | Status: AC
  Filled 2022-04-07: qty 20

## 2022-04-07 MED ORDER — SODIUM CHLORIDE 0.9 % IV SOLN: 2000.0000 mg | Freq: Once | INTRAVENOUS | Status: DC

## 2022-04-07 MED ORDER — GABAPENTIN 300 MG PO CAPS: 300.0000 mg | ORAL_CAPSULE | Freq: Three times a day (TID) | ORAL | 0 refills | Status: DC

## 2022-04-07 NOTE — ED Provider Notes (Addendum)
?[facility] EMERGENCY DEPARTMENT ?Provider Note ? ? ?CSN: [id number] ?Arrival date & time: 04/07/22  1621 ? ?  ? ?History ? ?Chief Complaint  ?Patient presents with  ? Headache  ? ? ?Lonnie [name] is a 71 y.o. male. ? ?71 year old male with prior medical history significant for stroke, subdural hematoma, ischemic cardiomyopathy, dementia, CAD presents today for evaluation of intermittent, sharp, severe pain around the left ear.  He states certain things he does such as eating, talking brings on the symptoms.  Patient presents with his wife who is at bedside.  She reports patient is also had some generalized weakness, and slurred speech over the past week which has been constant.  Denies facial droop, unilateral weakness or other complaints.  He was evaluated at urgent care yesterday and had his ears irrigated without improvement in symptoms.  Denies fever, or chills. ? ? ?Headache ?Associated symptoms: ear pain (Pain surrounding the left ear) and weakness   ?Associated symptoms: no fever   ? ? ?  ? ?Home Medications ?Prior to Admission medications   ?Medication Sig Start Date End Date Taking? Authorizing Provider  ?acetaminophen (TYLENOL) 500 MG tablet Take 1,000 mg by mouth in the morning and at bedtime.    [provider]  ?aspirin EC 81 MG tablet Take 81 mg by mouth in the morning.    [provider]  ?atorvastatin (LIPITOR) 80 MG tablet Take 1 tablet (80 mg total) by mouth daily. 03/17/22   [name], MD  ?Calcium Carb-Cholecalciferol (CALCIUM 600+D3 PO) Take 1 tablet by mouth daily with breakfast.    [provider]  ?citalopram (CELEXA) 20 MG tablet Take 0.5 tablets (10 mg total) by mouth 2 (two) times daily. 03/17/22   [name], MD  ?clonazePAM (KLONOPIN) 0.25 MG disintegrating tablet DISSOLVE 1 TABLET ON TOP OF THE TONGUE TWICE DAILY AS  NEEDED 02/04/22   [name], MD  ?clopidogrel (PLAVIX) 75 MG tablet Take 1 tablet (75 mg total) by mouth daily. 03/17/22   [name], MD  ?docusate sodium (COLACE) 100 MG capsule Take 100 mg by mouth in the morning.    [provider]  ?Ferrous Fumarate (HEMOCYTE - 106 MG FE) 324 (106 Fe) MG TABS tablet Take 1 tablet (106 mg of iron total) by mouth daily. 08/22/21   [name], MD  ?furosemide (LASIX) 20 MG tablet Take 1 tablet (20 mg total) by mouth daily as needed for edema. 03/17/22   [name], MD  ?gabapentin (NEURONTIN) 100 MG capsule Take 1 capsule (100 mg total) by mouth 2 (two) times daily. 03/17/22   [name], MD  ?irbesartan (AVAPRO) 300 MG tablet Take 1 tablet (300 mg total) by mouth daily. 03/17/22   [name], MD  ?melatonin 3 MG TABS tablet Take 2 mg by mouth at bedtime.    [provider]  ?Menthol, Topical Analgesic, (PAIN RELIEVING PATCH ULTRA ST EX) Apply 1 patch topically daily as needed (pain).    [provider]  ?metoprolol tartrate (LOPRESSOR) 25 MG tablet Take 0.5 tablets (12.5 mg total) by mouth 2 (two) times daily. 03/17/22   [name], MD  ?Multiple Vitamin (MULTIVITAMIN WITH MINERALS) TABS tablet Take 1 tablet by mouth in the morning.    [provider]  ?pantoprazole (PROTONIX) 40 MG tablet Take 1 tablet (40 mg total) by mouth at bedtime. 03/17/22   [name], MD  ?QUEtiapine (SEROQUEL) 200 MG tablet  Take 1 tablet (200 mg total) by mouth 2 (two) times daily. 03/17/22   Wanda PlumpPaz, Jose E, MD  ?tamsulosin (FLOMAX) 0.4 MG CAPS capsule Take 1 capsule (0.4 mg total) by mouth daily after supper. 03/17/22   Wanda PlumpPaz, Jose E, MD  ?   ? ?Allergies    ?Ace inhibitors, Eggs or egg-derived products, Lactose intolerance (gi), and Latex   ? ?Review of Systems   ?Review of Systems  ?Constitutional:  Negative for chills and fever.  ?HENT:  Positive for ear pain (Pain surrounding the left ear).   ?Eyes:  Negative for visual disturbance.  ?Respiratory:  Negative for shortness of breath.   ?Cardiovascular:  Negative for chest pain.  ?Neurological:  Positive for speech difficulty and weakness. Negative  for syncope, light-headedness and headaches.  ?All other systems reviewed and are negative. ? ?Physical Exam ?Updated Vital Signs ?BP 115/69   Pulse (!) 56   Temp 97.9 ?F (36.6 ?C) (Oral)   Resp 13   Ht 5\' 11"  (1.803 m)   Wt 93.9 kg   SpO2 98%   BMI 28.87 kg/m?  ?Physical Exam ?Vitals and nursing note reviewed.  ?Constitutional:   ?   General: He is not in acute distress. ?   Appearance: Normal appearance. He is well-developed. He is not ill-appearing.  ?HENT:  ?   Head: Normocephalic and atraumatic.  ?   Right Ear: Hearing, tympanic membrane, ear canal and external ear normal. No drainage, swelling or tenderness. No mastoid tenderness. Tympanic membrane is not erythematous or bulging.  ?   Left Ear: Hearing, tympanic membrane, ear canal and external ear normal. No drainage, swelling or tenderness. No mastoid tenderness. Tympanic membrane is not erythematous or bulging.  ?   Nose: Nose normal.  ?Eyes:  ?   General: No scleral icterus. ?   Extraocular Movements: Extraocular movements intact.  ?   Conjunctiva/sclera: Conjunctivae normal.  ?Cardiovascular:  ?   Rate and Rhythm: Regular rhythm. Bradycardia present.  ?   Pulses: Normal pulses.  ?   Heart sounds: Normal heart sounds.  ?Pulmonary:  ?   Effort: Pulmonary effort is normal. No respiratory distress.  ?   Breath sounds: Normal breath sounds. No wheezing or rales.  ?Abdominal:  ?   General: There is no distension.  ?   Tenderness: There is no abdominal tenderness.  ?Musculoskeletal:     ?   General: Normal range of motion.  ?   Cervical back: Normal range of motion.  ?   Comments: Right below-knee amputation.  ?Skin: ?   General: Skin is warm and dry.  ?Neurological:  ?   General: No focal deficit present.  ?   Mental Status: He is alert. Mental status is at baseline.  ? ? ?ED Results / Procedures / Treatments   ?Labs ?(all labs ordered are listed, but only abnormal results are displayed) ?Labs Reviewed  ?CBC WITH DIFFERENTIAL/PLATELET - Abnormal; Notable  for the following components:  ?    Result Value  ? Hemoglobin 12.9 (*)   ? MCH 25.5 (*)   ? Eosinophils Absolute 1.2 (*)   ? All other components within normal limits  ?BASIC METABOLIC PANEL - Abnormal; Notable for the following components:  ? CO2 21 (*)   ? All other components within normal limits  ?URINALYSIS, ROUTINE W REFLEX MICROSCOPIC  ? ? ?EKG ?None ? ?Radiology ?CT Head Wo Contrast ? ?Result Date: 04/07/2022 ?CLINICAL DATA:  Left-sided head and jaw pain, concern for abscess. EXAM:  CT HEAD WITHOUT CONTRAST CT MAXILLOFACIAL WITHOUT CONTRAST TECHNIQUE: Multidetector CT imaging of the head and maxillofacial structures were performed using the standard protocol without intravenous contrast. Multiplanar CT image reconstructions of the maxillofacial structures were also generated. RADIATION DOSE REDUCTION: This exam was performed according to the departmental dose-optimization program which includes automated exposure control, adjustment of the mA and/or kV according to patient size and/or use of iterative reconstruction technique. COMPARISON:  06/15/2021 CT head, 07/10/2021 MRI FINDINGS: CT HEAD FINDINGS Brain: No evidence of acute infarction, hemorrhage, cerebral edema, mass, mass effect, or midline shift. No hydrocephalus. Unchanged right-greater-than-left subdural hygroma. No new extra-axial fluid collection. Periventricular white matter changes, likely the sequela of chronic small vessel ischemic disease. Vascular: No hyperdense vessel. Skull: Normal. Negative for fracture or focal lesion. CT MAXILLOFACIAL FINDINGS Osseous: No fracture or mandibular dislocation. Periapical lucency about the roots of a bilateral maxillary molars (series 8, image 18 and 44), with possible thinning of the inferior maxillary floor. Multiple dental caries. Orbits: Negative. No traumatic or inflammatory finding. Sinuses: Diffuse mucosal thickening, most prominent in the ethmoid air cells and inferior maxillary sinuses. The mastoids  are well aerated. Soft tissues: Negative. IMPRESSION: 1.  No acute intracranial process. 2. Multiple dental caries with periapical lucency about the roots of bilateral maxillary molars, with possible thinn

## 2022-04-07 NOTE — ED Notes (Signed)
ED Provider at bedside. 

## 2022-04-07 NOTE — Discharge Instructions (Signed)
Your work-up today was reassuring.  CT scan of the head and face did not show anything concerning.  Your symptoms were consistent with something called trigeminal neuralgia.  I have attached information regarding this above for you.  You received IV medication in the emergency room with complete resolution of your symptoms.  I have increased your dose of gabapentin to 300 mg 3 times daily.  We discussed with your slurred speech the neck step would be transferred to [facility] for an MRI.  We discussed this option but to stated you would rather hold off and follow-up with your primary care doctor and return if you have any worsening symptoms.  If you do return for evaluation of slurred speech or any strokelike symptoms do not recommend you go to either [facility] as we do not have MRI in this emergency room. ?

## 2022-04-07 NOTE — ED Triage Notes (Signed)
C/o left sided head/ jaw pain intermittently the past few weeks, worse the past few days, hx of brain surgery. Went to urgent care and wife states he has been c/o of worsening pain. Hx of dementia. Dx with UTI on 4/12, been taking antibiotics.  ?

## 2022-04-07 NOTE — ED Notes (Signed)
Patient transported to CT 

## 2022-04-11 ENCOUNTER — Encounter: Payer: Self-pay | Admitting: Family Medicine

## 2022-04-11 ENCOUNTER — Ambulatory Visit (INDEPENDENT_AMBULATORY_CARE_PROVIDER_SITE_OTHER): Payer: Medicare HMO | Admitting: Family Medicine

## 2022-04-11 VITALS — BP 140/80 | HR 56 | Temp 98.8°F

## 2022-04-11 DIAGNOSIS — K0889 Other specified disorders of teeth and supporting structures: Secondary | ICD-10-CM

## 2022-04-11 DIAGNOSIS — F0280 Dementia in other diseases classified elsewhere without behavioral disturbance: Secondary | ICD-10-CM | POA: Diagnosis not present

## 2022-04-11 DIAGNOSIS — R4781 Slurred speech: Secondary | ICD-10-CM | POA: Diagnosis not present

## 2022-04-11 DIAGNOSIS — G3109 Other frontotemporal dementia: Secondary | ICD-10-CM | POA: Diagnosis not present

## 2022-04-11 MED ORDER — TRAMADOL HCL 50 MG PO TABS: 50.0000 mg | ORAL_TABLET | Freq: Three times a day (TID) | ORAL | 0 refills | Status: DC | PRN

## 2022-04-11 NOTE — Patient Instructions (Addendum)
Please schedule a visit to your dentist.  ? ?OK to take Tylenol 1000 mg (2 extra strength tabs) or 975 mg (3 regular strength tabs) every 6 hours as needed. ? ?Do not drink alcohol, do any illicit/street drugs, drive or do anything that requires alertness while on this medicine.  ? ?Someone will reach out relatively soon regarding your MRI and we will go from there.  ? ?Let us know if you need anything. ?

## 2022-04-11 NOTE — Progress Notes (Signed)
Chief Complaint  ?Patient presents with  ? Follow-up  ?  Referral neurology ?Needs MRI  ? ? ?Subjective: ?Patient is a 71 y.o. male here for ED f/u. Here w wife. ? ?1 week ago, the patient started slurring his speech more.  He also has somewhat more difficulty swallowing food.  He has frontotemporal dementia and his mental status has been at baseline.  He has associated left jaw/dental pain.  It radiates into his head.  He went to the emergency department and CT head/maxillofacial region did not show any intracranial abnormality but did show possible dental disease in the right upper maxillary region where his pain is.  He does follow with a dentist but has not seen them in quite some time.  He has been taking leftover tramadol which has been helpful in addition to Tylenol.  His wife has not given him both together due to concern for medication interaction.  He denies any recent injury or change in activity.  She denies a facial droop or weakness compared to baseline.  He is compliant with his medications.  This does include aspirin, Plavix, and Lipitor.  There was some concern with possible seizure-like activity, and on-call neurologist recommended loading with Dilantin and seeing a neurologist if that helps his slurred speech.  He did not so recommendation for outpatient stroke work-up with an MRI was recommended. ? ?Past Medical History:  ?Diagnosis Date  ? Allergy   ? Anxiety   ? Arthritis   ? left neck, shoulder, knee  ? CAD (coronary artery disease)   ? a. anterior STEMI 10/2013 s/p 4V CABG with LIMA to mid LAD, SVG to OM, SVG to PDA, SVG to Diagonal.  ? Chronic combined systolic and diastolic CHF (congestive heart failure) (HCC)   ? Dementia (HCC)   ? Depression   ? Falls   ? Hypertension   ? Ischemic cardiomyopathy   ? a. EF 40-45% at time of CABG and in 2018.  ? MI (myocardial infarction) (HCC)   ? Peripheral neuropathy   ? Prediabetes 09/01/2014  ? PVD (peripheral vascular disease) (HCC)   ? a. s/p L SFA  stents with now known bilateral SFA. b. right femoral to below the knee bypass by Dr. Myra Gianotti in 12/2019, c/b infection 02/2020.  ? Stroke [facility])   ? seen on CT Scan  ? Subdural hematoma (HCC)   ? Tobacco abuse   ? UTI (urinary tract infection)   ? ? ?Objective: ?BP 140/80   Pulse (!) 56   Temp 98.8 ?F (37.1 ?C) (Oral)   SpO2 93%  ?General: Awake, appears stated age ?Heart: Bradycardic, regular rhythm, no LE edema ?Lungs: CTAB, no rales, wheezes or rhonchi. No accessory muscle use ?Neuro: Slurred but coherent speech, mouth deviates to the right during speech, no facial droop when smiling or raising eyebrows, 5/5 grip strength bilaterally, swallowing saliva seems coordinated ?Oriented to person, place, year, and president; states the date is January 30 ?MSK: No TTP over the temporalis or TMJ region bilaterally ?Mouth: MMM, missing teeth with dental decay noted in upper and lower jaw, I do not appreciate any gingival erythema or edema in the upper left maxillary region where his pain is ?Psych: normal affect and mood ? ?Assessment and Plan: ?Slurred speech - Plan: MR Brain Wo Contrast ? ?Dentalgia - Plan: traMADol (ULTRAM) 50 MG tablet ? ?Fronto-temporal dementia (HCC) ? ?I think some of the behaviors which were concerning for stroke/seizure were actually due to dental pain.  This is  reinforced with the fact that the Dilantin did not help.  I offered referral to a speech therapist but they politely declined at this time.  Encouraged proper chewing and giving small bites.  We will check an MRI of the brain.  May need to refer to neurology pending the results. ?Tylenol, tramadol 50-100 mg 3 times daily as needed.  Was tolerating this medicine well prior to my meeting with him today.  Needs to see a dentist.  Unfortunately, the imaging findings related to the dental area was not relayed to the patient's wife. ?Around baseline today per report. ?Follow-up with regular PCP as originally scheduled or as needed with me. ?The  patient and his spouse voiced understanding and agreement to the plan. ? ?I spent 50 minutes with the patient and his wife discussing the above plan and reviewing his recent imaging and hospitalization/chart on the same day of the visit. ? ?Sharlene Dory, DO ?04/11/22  ?4:42 PM ? ? ? ? ?

## 2022-04-12 ENCOUNTER — Telehealth: Payer: Self-pay | Admitting: Family Medicine

## 2022-04-12 NOTE — Telephone Encounter (Signed)
Received call from on call service,  pt needs to have a tooth pulled asap and DDS is asking how long to hold asa 81 and plavix ?[name] is actually my patient.  Pt suffers from dementia.  Called her to discuss- she reports they hope to pull broken tooth today if they can- if not today Monday ? ?Called dentist at [phone] and LMOM with my cell number.  From our standpoint continuing antiplatelet agents is most ideal, but if required to stop prior to surgery we defer to DDS judgement.  I have explained to daughter that pausing antiplatelet agents increases risk of stroke or ACS-  they are willing to take this risk if required due to severe pain from tooth  ?  ?

## 2022-04-13 ENCOUNTER — Encounter (HOSPITAL_BASED_OUTPATIENT_CLINIC_OR_DEPARTMENT_OTHER): Payer: Self-pay

## 2022-04-13 ENCOUNTER — Other Ambulatory Visit: Payer: Self-pay

## 2022-04-13 ENCOUNTER — Emergency Department (HOSPITAL_BASED_OUTPATIENT_CLINIC_OR_DEPARTMENT_OTHER)
Admission: EM | Admit: 2022-04-13 | Discharge: 2022-04-13 | Disposition: A | Discharge status: Home / Self Care | Payer: Medicare HMO | Attending: Emergency Medicine | Admitting: Emergency Medicine

## 2022-04-13 DIAGNOSIS — I110 Hypertensive heart disease with heart failure: Secondary | ICD-10-CM | POA: Insufficient documentation

## 2022-04-13 DIAGNOSIS — K0889 Other specified disorders of teeth and supporting structures: Secondary | ICD-10-CM | POA: Diagnosis not present

## 2022-04-13 DIAGNOSIS — F0390 Unspecified dementia without behavioral disturbance: Secondary | ICD-10-CM | POA: Diagnosis not present

## 2022-04-13 DIAGNOSIS — I2510 Atherosclerotic heart disease of native coronary artery without angina pectoris: Secondary | ICD-10-CM | POA: Diagnosis not present

## 2022-04-13 DIAGNOSIS — Z7902 Long term (current) use of antithrombotics/antiplatelets: Secondary | ICD-10-CM | POA: Insufficient documentation

## 2022-04-13 DIAGNOSIS — I5040 Unspecified combined systolic (congestive) and diastolic (congestive) heart failure: Secondary | ICD-10-CM | POA: Diagnosis not present

## 2022-04-13 DIAGNOSIS — Z7982 Long term (current) use of aspirin: Secondary | ICD-10-CM | POA: Diagnosis not present

## 2022-04-13 DIAGNOSIS — Z91040 Latex allergy status: Secondary | ICD-10-CM | POA: Diagnosis not present

## 2022-04-13 DIAGNOSIS — I10 Essential (primary) hypertension: Secondary | ICD-10-CM | POA: Diagnosis not present

## 2022-04-13 DIAGNOSIS — Z79899 Other long term (current) drug therapy: Secondary | ICD-10-CM | POA: Diagnosis not present

## 2022-04-13 MED ORDER — ONDANSETRON 4 MG PO TBDP
8.0000 mg | ORAL_TABLET | Freq: Once | ORAL | Status: AC
  Administered 2022-04-13: 8 mg via ORAL
  Filled 2022-04-13: qty 2

## 2022-04-13 MED ORDER — OXYCODONE HCL 10 MG PO TABS: 10.0000 mg | ORAL_TABLET | ORAL | 0 refills | Status: DC | PRN

## 2022-04-13 MED ORDER — LIDOCAINE VISCOUS HCL 2 % MT SOLN
15.0000 mL | Freq: Once | OROMUCOSAL | Status: AC
Start: 2022-04-13 — End: 2022-04-13
  Administered 2022-04-13: 15 mL via OROMUCOSAL
  Filled 2022-04-13: qty 15

## 2022-04-13 MED ORDER — HYDROMORPHONE HCL 1 MG/ML IJ SOLN
2.0000 mg | Freq: Once | INTRAMUSCULAR | Status: AC
  Administered 2022-04-13: 2 mg via INTRAMUSCULAR
  Filled 2022-04-13: qty 2

## 2022-04-13 NOTE — ED Provider Notes (Signed)
?[facility] EMERGENCY DEPARTMENT ?Provider Note ? ? ?CSN: [id number] ?Arrival date & time: 04/13/22  5621 ? ?  ? ?History ? ?Chief Complaint  ?Patient presents with  ? Dental Pain  ? ? ?Lonnie [name] is a 71 y.o. male. ? ?The history is provided by a relative and the patient. The history is limited by the condition of the patient (Dementia).  ?Dental Pain ?He has history of hypertension, prediabetes, coronary artery disease, combined systolic and diastolic heart failure, stroke, dementia and comes in because of a painful left upper molar.  He has seen his dentist who was trying to get confirmation that he is not on an anticoagulant and was planning to do what was most likely going to be an extraction.  He is currently on amoxicillin.  He has been taking oxycodone for pain, but was not getting any relief from pain tonight.  There has been no fever. ?  ?Home Medications ?Prior to Admission medications   ?Medication Sig Start Date End Date Taking? Authorizing Provider  ?oxyCODONE 10 MG TABS Take 1 tablet (10 mg total) by mouth every 4 (four) hours as needed for severe pain. 04/13/22  Yes Dione Booze, MD  ?acetaminophen (TYLENOL) 500 MG tablet Take 1,000 mg by mouth in the morning and at bedtime.    [provider]  ?aspirin EC 81 MG tablet Take 81 mg by mouth in the morning.    [provider]  ?atorvastatin (LIPITOR) 80 MG tablet Take 1 tablet (80 mg total) by mouth daily. 03/17/22   [name], MD  ?Calcium Carb-Cholecalciferol (CALCIUM 600+D3 PO) Take 1 tablet by mouth daily with breakfast.    [provider]  ?citalopram (CELEXA) 20 MG tablet Take 0.5 tablets (10 mg total) by mouth 2 (two) times daily. 03/17/22   [name], MD  ?clonazePAM (KLONOPIN) 0.25 MG disintegrating tablet DISSOLVE 1 TABLET ON TOP OF THE TONGUE TWICE DAILY AS  NEEDED 02/04/22   [name], MD  ?clopidogrel (PLAVIX) 75 MG tablet Take 1 tablet (75 mg total) by mouth daily. 03/17/22   [name], MD   ?docusate sodium (COLACE) 100 MG capsule Take 100 mg by mouth in the morning.    [provider]  ?Ferrous Fumarate (HEMOCYTE - 106 MG FE) 324 (106 Fe) MG TABS tablet Take 1 tablet (106 mg of iron total) by mouth daily. 08/22/21   [name], MD  ?furosemide (LASIX) 20 MG tablet Take 1 tablet (20 mg total) by mouth daily as needed for edema. 03/17/22   [name], MD  ?gabapentin (NEURONTIN) 300 MG capsule Take 1 capsule (300 mg total) by mouth 3 (three) times daily. 04/07/22   Marita Kansas, PA-C  ?irbesartan (AVAPRO) 300 MG tablet Take 1 tablet (300 mg total) by mouth daily. 03/17/22   [name], MD  ?melatonin 3 MG TABS tablet Take 2 mg by mouth at bedtime.    [provider]  ?Menthol, Topical Analgesic, (PAIN RELIEVING PATCH ULTRA ST EX) Apply 1 patch topically daily as needed (pain).    [provider]  ?metoprolol tartrate (LOPRESSOR) 25 MG tablet Take 0.5 tablets (12.5 mg total) by mouth 2 (two) times daily. 03/17/22   [name], MD  ?Multiple Vitamin (MULTIVITAMIN WITH MINERALS) TABS tablet Take 1 tablet by mouth in the morning.    [provider]  ?pantoprazole (PROTONIX) 40 MG tablet Take 1 tablet (40 mg total) by mouth at bedtime. 03/17/22  [name], MD  ?QUEtiapine (SEROQUEL) 200 MG tablet Take 1 tablet (200 mg total) by mouth 2 (two) times daily. 03/17/22   [name], MD  ?tamsulosin (FLOMAX) 0.4 MG CAPS capsule Take 1 capsule (0.4 mg total) by mouth daily after supper. 03/17/22   [name], MD  ?   ? ?Allergies    ?Ace inhibitors, Eggs or egg-derived products, Lactose intolerance (gi), and Latex   ? ?Review of Systems   ?Review of Systems  ?Unable to perform ROS: Dementia  ? ?Physical Exam ?Updated Vital Signs ?BP (!) 197/84   Pulse (!) 50   Temp 98.4 ?F (36.9 ?C) (Oral)   Resp 20   Ht 5\' 11"  (1.803 m)   Wt 94.8 kg   SpO2 98%   BMI 29.15 kg/m?  ?Physical Exam ?Vitals and nursing note reviewed.  ?71 year old male, resting comfortably and in no acute  distress. Vital signs are significant for elevated blood pressure and slightly slow heart rate. Oxygen saturation is 98%, which is normal. ?Head is normocephalic and atraumatic. PERRLA, EOMI. Tooth #15 is markedly tender to percussion but without obvious caries.  There is no gingival swelling or pallor or erythema, but is very tender.  He is status post extractions of teeth #16, 14, 13. ?Neck is nontender and supple without adenopathy or JVD. ?Back is nontender and there is no CVA tenderness. ?Lungs are clear without rales, wheezes, or rhonchi. ?Chest is nontender. ?Heart has regular rate and rhythm without murmur. ?Abdomen is soft, flat, nontender. ?Extremities have no cyanosis or edema, full range of motion is present. ?Skin is warm and dry without rash. ?Neurologic: Awake and alert, cranial nerves are intact, moves all extremities equally. ? ?ED Results / Procedures / Treatments   ? ?Procedures ?Procedures  ? ? ?Medications Ordered in ED ?Medications  ?ondansetron (ZOFRAN-ODT) disintegrating tablet 8 mg (has no administration in time range)  ?HYDROmorphone (DILAUDID) injection 2 mg (has no administration in time range)  ?lidocaine (XYLOCAINE) 2 % viscous mouth solution 15 mL (has no administration in time range)  ? ? ?ED Course/ Medical Decision Making/ A&P ?  ?                        ?Medical Decision Making ?Risk ?Prescription drug management. ? ? ?Dental pain.  Family states that problem was diagnosed as a fractured tooth which will likely need to be extracted.  Old records are reviewed showing office visit on 4/21 for dental pain, among other things.  I have suggested to family that he might benefit from a higher dose of oxycodone, but they are requesting an injection prior to discharge.  He is given injection of hydromorphone and is given a prescription for a few days supply of oxycodone until he can have definitive management of the tooth by his dentist. ? ?Final Clinical Impression(s) / ED Diagnoses ?Final  diagnoses:  ?Pain, dental  ?Elevated blood pressure reading with diagnosis of hypertension  ? ? ?Rx / DC Orders ?ED Discharge Orders   ? ?      Ordered  ?  oxyCODONE 10 MG TABS  Every 4 hours PRN       ? 04/13/22 0654  ? ?  ?  ? ?  ? ? ?  ?04/15/22, MD ?04/13/22 04/15/22 ? ?

## 2022-04-13 NOTE — Discharge Instructions (Addendum)
Please continue taking the amoxicillin which was prescribed by your dentist.  You will need to follow-up with your dentist for definitive management of your bad tooth. ?

## 2022-04-13 NOTE — Telephone Encounter (Signed)
Thank you :)

## 2022-04-13 NOTE — ED Triage Notes (Signed)
Left lower dental pain with broken tooth. ?

## 2022-04-13 NOTE — ED Notes (Signed)
Patient states that pain is still 9 after taking pain medication. Informed the provider. Patients blood pressure was also elevated notified the provider and family states that he has  blood pressure medication at home that he takes daily.States that the patient will take medication once he gets home. ?

## 2022-04-14 NOTE — Telephone Encounter (Signed)
[facility] Night - Client ?Nonclinical Telephone Record  ?Access[employer]urse? ?Client [facility] Night - Client ?Client Site [facility] - Night ?Provider Copland, Karleen Hampshire - MD ?Contact Type Call ?Who Is Calling Patient / Member / Family / Caregiver ?Caller Name Theressa Stamps [place] ?Caller Phone Number [phone] ?Patient Name Lonnie [name] ?Patient DOB 14-Jul-1951 ?Call Type Message Only Information Provided ?Reason for Call Request to Speak to a Physician ?Initial Comment Caller states her father needs emergency dental surgery. Dr. Patsy Lager needs to know how long ?the patient needs to be off of blood thinners. ?Additional Comment Caller states she needs to provide an email address to the office for the blood thinner ?information. ?Disp. Time Disposition Final User ?04/12/2022 2:13:57 PM General Information Provided Yes Quentin Angst ?Call Closed By: Quentin Angst ?Transaction Date/Time: 04/12/2022 2:08:06 PM (ET ?

## 2022-04-14 NOTE — Telephone Encounter (Signed)
See below- Dr. Lorelei Pont took care of.  ?

## 2022-04-14 NOTE — Telephone Encounter (Signed)
Please see phone note with Dr Lorelei Pont on 04/12/22 and  note on 04/13/22. Sending note to Dr Kathlene November and [facility] CMA. ?

## 2022-04-14 NOTE — Telephone Encounter (Signed)
Initial Comment Caller says her father is needing to haveemergency ?dental work done and the dentist needsto know ?about blood thinner that the patient is on.He has a ?tooth that has broken off at the root andthey won't ?do anything until they find out aboutthe blood ?thinner. They are currently at the dentaloffice now ?and need a call back asap if possible. ?Additional Comment was asked to call back at 1pm when the on call ?changed; ?Translation No ?Nurse Assessment ?Nurse: Shawnie Dapper, RN, Sarah Date/Time Lonnie [name] Time): 04/12/2022 1:36:33 PM ?Confirm and document reason for call. If ?symptomatic, describe symptoms. ?---Callers states dad was seen in dentist this morning. ?He needs an emergency tooth extraction, but dentist ?wants direction on how long to hold aspirin 81 mg and ?plavix 75mg . Daughter denies any new or worsening ?symptoms to triage at this time. ?Does the patient have any new or worsening ?symptoms? ---No ?Disp. Time (Eastern ?Time) Disposition Final User ?04/12/2022 1:44:12 PM Called On-Call Provider 04/14/2022, RN, Sarah ?04/12/2022 1:45:00 PM Clinical Call Yes 04/14/2022, RN, Sarah ?PLEASE NOTE: All timestamps contained within this report are represented as Shawnie Dapper Standard Time. ?CONFIDENTIALTY NOTICE: This fax transmission is intended only for the addressee. It contains information that is legally privileged, confidential or ?otherwise protected from use or disclosure. If you are not the intended recipient, you are strictly prohibited from reviewing, disclosing, copying using ?or disseminating any of this information or taking any action in reliance on or regarding this information. If you have received this fax in error, please ?notify [place] immediately by telephone so that we can arrange for its return to [place]. Phone: [phone], Toll-Free: [phone], Fax: [phone] ?Page: 2 of 2 ?Call Id: [phone] ?Paging ?DoctorName Phone DateTime Result/ ?Outcome Message Type Notes ?Copland, Spencer - ?MD  [phone] ?04/12/2022 ?1:44:12 ?PM ?Called On ?Call Provider - ?Reached ?Doctor Paged ?Copland, Spencer - ?MD ?04/12/2022 ?1:44:48 ?PM ?Spoke with On ?Call - General Message Result Dr. 04/14/2022 reached ?

## 2022-04-15 ENCOUNTER — Other Ambulatory Visit: Payer: Self-pay

## 2022-04-15 ENCOUNTER — Encounter: Payer: Self-pay | Admitting: Family Medicine

## 2022-04-15 DIAGNOSIS — Z515 Encounter for palliative care: Secondary | ICD-10-CM

## 2022-04-15 NOTE — Progress Notes (Signed)
COMMUNITY PALLIATIVE CARE SW NOTE ? ?PATIENT NAME: Lonnie [name] ?DOB: Jul 07, 1951 ?MRN: [id number] ? ?PRIMARY CARE PROVIDER: [name], MD ? ?RESPONSIBLE PARTY:  ?Acct ID - Guarantor Home Phone Work Phone Relationship Acct Type  ?[id number] Lonnie [name],* [phone]  Self P/F  ?   [address], [place], [place] [address]  ? ? ?(2:35 pm- 3:00 pm)PC SW completed a telephonic encounter with patient's daughter who stated that patient's cognitive condition is declining. Patient's sundowning is getting worst and they are needing placement for him. She stated that patient has had several ER visits but nothing has been done. SW advised her that she would assist, but needed to understand that the financial responsibility will rest with the family. [name] verbalized understanding. SW will continue to assist with placement per request of patient's daughter.  ? ?SW scheduled a tour with [facility] for 4/16 at 2 pm and have calls out to [facility] [employer] and [facility] memory care for a call back.  ? ?Clydia Llano, LCSW ? ?

## 2022-04-16 ENCOUNTER — Other Ambulatory Visit: Payer: Self-pay | Admitting: Family Medicine

## 2022-04-16 DIAGNOSIS — R519 Headache, unspecified: Secondary | ICD-10-CM

## 2022-04-17 ENCOUNTER — Encounter: Payer: Self-pay | Admitting: Physician Assistant

## 2022-04-18 ENCOUNTER — Other Ambulatory Visit: Payer: Self-pay

## 2022-04-18 DIAGNOSIS — Z515 Encounter for palliative care: Secondary | ICD-10-CM

## 2022-04-18 NOTE — Progress Notes (Signed)
COMMUNITY PALLIATIVE CARE SW NOTE ? ?PATIENT NAME: Lonnie [name] ?DOB: 1951-10-15 ?MRN: [id number] ? ?PRIMARY CARE PROVIDER: [name], MD ? ?RESPONSIBLE PARTY:  ?Acct ID - Guarantor Home Phone Work Phone Relationship Acct Type  ?[id number] Callahan Wild,* [phone]  Self P/F  ?   [address], [place], [place] [address]  ? ?SOCIAL WORK TELEPHONIC ENCOUNTER (1:15 pm-1:35 pm) ? ?SW completed a follow-up telephonic encounter with patient's daughter to find out status of placement for patient. SW scheduled a tour with [facility]. The daughter advised that she liked the facility, but it was not in their price range. SW inquired what their plan currently was to get additional care in the home for patient. She stated that patient's behaviors were better this weeks, so they feel they could keep patient in the home. The family is looking for respite care for patient. SW advised that she is sent her all the information on placements, and have connected her with possible placements. SW advised the daughter to please call with any questions or concerns if SW could help them further in the future. ? ?Clydia Llano, LCSW ? ?

## 2022-04-21 ENCOUNTER — Telehealth: Payer: Self-pay | Admitting: Internal Medicine

## 2022-04-21 NOTE — Telephone Encounter (Signed)
Pre services called and stated pt's insurance requires a PA before he can get an mri done.  ?

## 2022-04-23 ENCOUNTER — Ambulatory Visit (HOSPITAL_BASED_OUTPATIENT_CLINIC_OR_DEPARTMENT_OTHER)
Admission: RE | Admit: 2022-04-23 | Discharge: 2022-04-23 | Disposition: A | Discharge status: Home / Self Care | Payer: Medicare HMO | Source: Ambulatory Visit | Attending: Family Medicine | Admitting: Family Medicine

## 2022-04-23 DIAGNOSIS — G319 Degenerative disease of nervous system, unspecified: Secondary | ICD-10-CM | POA: Diagnosis not present

## 2022-04-23 DIAGNOSIS — R519 Headache, unspecified: Secondary | ICD-10-CM | POA: Diagnosis not present

## 2022-04-23 DIAGNOSIS — R4781 Slurred speech: Secondary | ICD-10-CM | POA: Insufficient documentation
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
## 2022-04-24 ENCOUNTER — Other Ambulatory Visit: Payer: Self-pay | Admitting: Internal Medicine

## 2022-04-25 MED ORDER — CLONAZEPAM 0.25 MG PO TBDP: 0.2500 mg | ORAL_TABLET | Freq: Two times a day (BID) | ORAL | 0 refills | Status: DC | PRN

## 2022-04-25 NOTE — Telephone Encounter (Signed)
PDMP okay, Rx sent 

## 2022-04-25 NOTE — Telephone Encounter (Signed)
Pt message:  ?This prescription goes to [facility] not [facility]. We are trying to get my dad?s information transferred over to the Palliative Care doctors, but as of yet that has not been completed and he is almost out of this medication ? ? ?Requesting: clonazepam 0.25mg   ?Contract: N/A ?UDS: N/A ?Last Visit: 04/11/22 w/ Dr. Carmelia Roller ?Next Visit: None ?Last Refill: 02/04/22 #180 and 0RF ? ?Please Advise ? ?

## 2022-04-30 DIAGNOSIS — I110 Hypertensive heart disease with heart failure: Secondary | ICD-10-CM | POA: Diagnosis not present

## 2022-04-30 DIAGNOSIS — F5105 Insomnia due to other mental disorder: Secondary | ICD-10-CM | POA: Diagnosis not present

## 2022-04-30 DIAGNOSIS — K2100 Gastro-esophageal reflux disease with esophagitis, without bleeding: Secondary | ICD-10-CM | POA: Diagnosis not present

## 2022-04-30 DIAGNOSIS — I5042 Chronic combined systolic (congestive) and diastolic (congestive) heart failure: Secondary | ICD-10-CM | POA: Diagnosis not present

## 2022-04-30 DIAGNOSIS — I739 Peripheral vascular disease, unspecified: Secondary | ICD-10-CM | POA: Diagnosis not present

## 2022-04-30 DIAGNOSIS — I255 Ischemic cardiomyopathy: Secondary | ICD-10-CM | POA: Diagnosis not present

## 2022-04-30 DIAGNOSIS — E7800 Pure hypercholesterolemia, unspecified: Secondary | ICD-10-CM | POA: Diagnosis not present

## 2022-04-30 DIAGNOSIS — N401 Enlarged prostate with lower urinary tract symptoms: Secondary | ICD-10-CM | POA: Diagnosis not present

## 2022-04-30 DIAGNOSIS — M792 Neuralgia and neuritis, unspecified: Secondary | ICD-10-CM | POA: Diagnosis not present

## 2022-05-01 ENCOUNTER — Emergency Department (HOSPITAL_COMMUNITY): Payer: Medicare HMO

## 2022-05-01 ENCOUNTER — Encounter (HOSPITAL_COMMUNITY): Payer: Self-pay

## 2022-05-01 ENCOUNTER — Emergency Department (HOSPITAL_COMMUNITY)
Admission: EM | Admit: 2022-05-01 | Discharge: 2022-05-01 | Disposition: A | Discharge status: Home / Self Care | Payer: Medicare HMO | Attending: Emergency Medicine | Admitting: Emergency Medicine

## 2022-05-01 ENCOUNTER — Other Ambulatory Visit: Payer: Self-pay

## 2022-05-01 DIAGNOSIS — I10 Essential (primary) hypertension: Secondary | ICD-10-CM | POA: Diagnosis not present

## 2022-05-01 DIAGNOSIS — Z9889 Other specified postprocedural states: Secondary | ICD-10-CM | POA: Diagnosis not present

## 2022-05-01 DIAGNOSIS — R519 Headache, unspecified: Secondary | ICD-10-CM | POA: Diagnosis not present

## 2022-05-01 DIAGNOSIS — J3489 Other specified disorders of nose and nasal sinuses: Secondary | ICD-10-CM | POA: Diagnosis not present

## 2022-05-01 DIAGNOSIS — Z7982 Long term (current) use of aspirin: Secondary | ICD-10-CM | POA: Insufficient documentation

## 2022-05-01 DIAGNOSIS — J341 Cyst and mucocele of nose and nasal sinus: Secondary | ICD-10-CM | POA: Diagnosis not present

## 2022-05-01 DIAGNOSIS — Z79899 Other long term (current) drug therapy: Secondary | ICD-10-CM | POA: Insufficient documentation

## 2022-05-01 DIAGNOSIS — Z91040 Latex allergy status: Secondary | ICD-10-CM | POA: Diagnosis not present

## 2022-05-01 DIAGNOSIS — K047 Periapical abscess without sinus: Secondary | ICD-10-CM | POA: Diagnosis not present

## 2022-05-01 DIAGNOSIS — H9202 Otalgia, left ear: Secondary | ICD-10-CM | POA: Insufficient documentation

## 2022-05-01 DIAGNOSIS — S0993XA Unspecified injury of face, initial encounter: Secondary | ICD-10-CM | POA: Diagnosis not present
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
## 2022-05-01 IMAGING — CT CT HEAD W/O CM
4 series · 14 of 47 positions shown, 16 images · non-contrast
Comparison: Head and maxillofacial CT [DATE]. Brain MRI
[DATE].

CLINICAL DATA: Multiple falls.  Jaw pain.

EXAM:
CT HEAD WITHOUT CONTRAST
TECHNIQUE: Multidetector CT imaging of the head was performed using the
standard protocol without intravenous contrast.
RADIATION DOSE REDUCTION: This exam was performed according to the
departmental dose-optimization program which includes automated
exposure control, adjustment of the mA and/or kV according to
patient size and/or use of iterative reconstruction technique.

[Series 3: head without · axial · non-contrast · 0.49mm/px · z∈[-84,+31]mm · 7 of 31 slices shown, 9 images]
[im 4/31  brain]
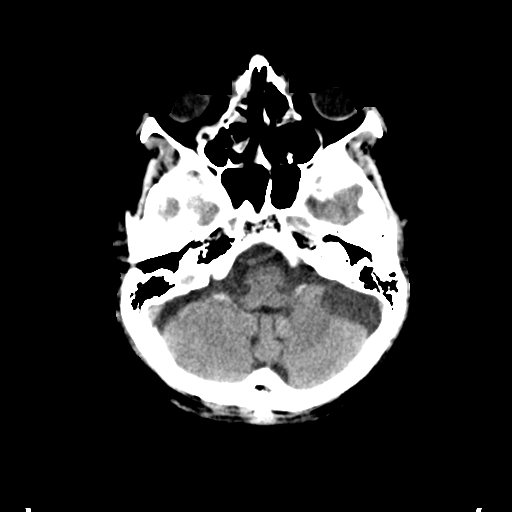
[im 4/31  bone]
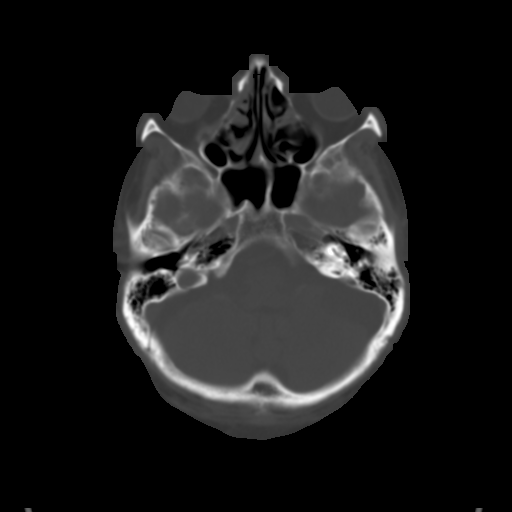
[im 8/31  brain]
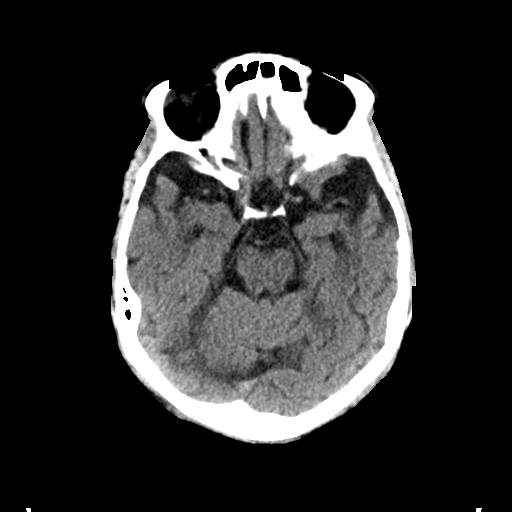
[im 12/31  brain]
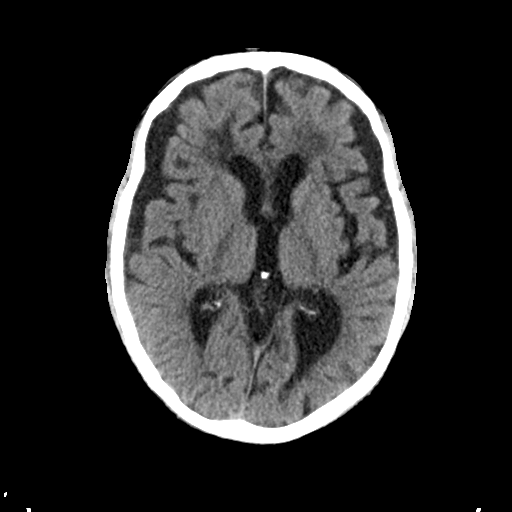
[im 16/31  brain]
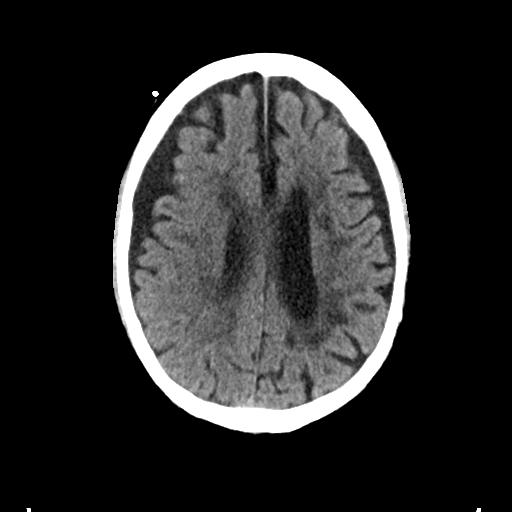
[im 19/31  brain]
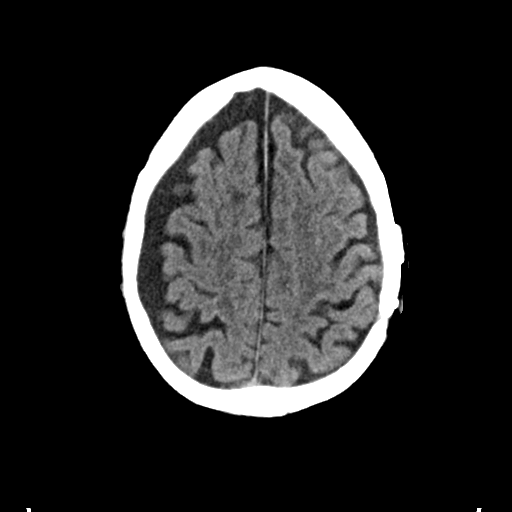
[im 19/31  bone]
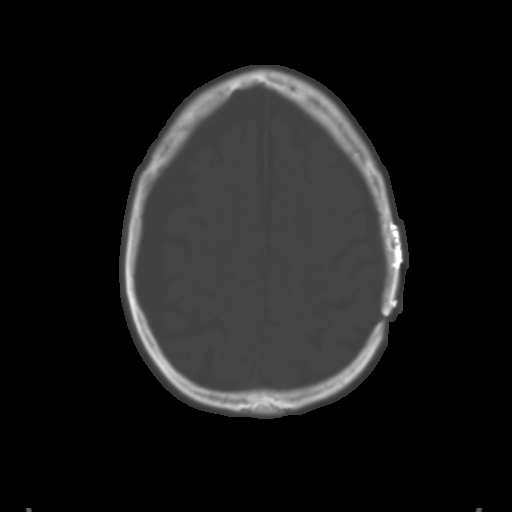
[im 23/31  brain]
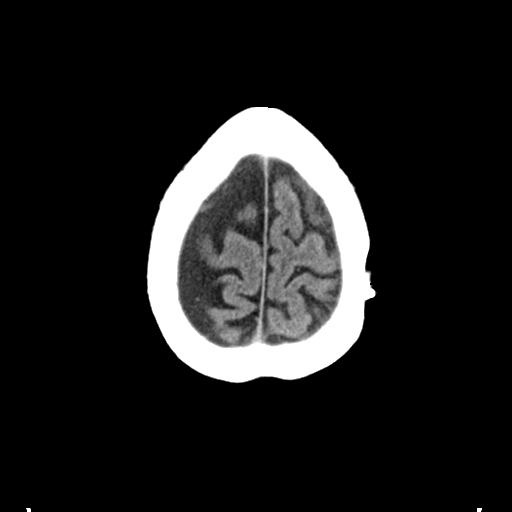
[im 27/31  brain]
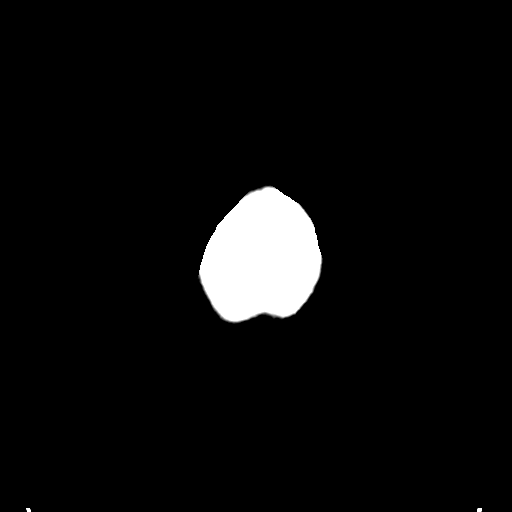

[Series 4: head bone · axial · 0.49mm/px · 1 of 76 slices shown]
[im 8/76  bone]
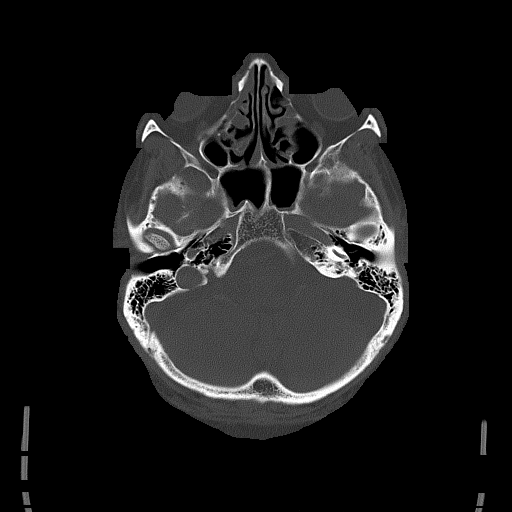

[Series 5: head without cor · coronal · non-contrast · 0.29mm/px · 3 of 67 slices shown]
[im 23/67  brain]
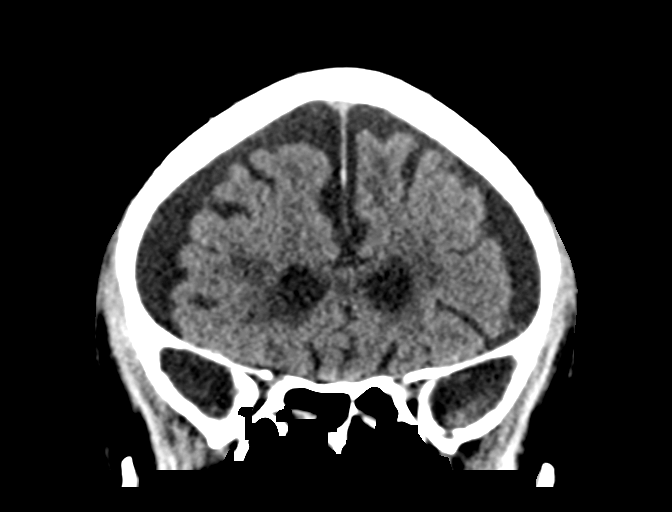
[im 30/67  brain]
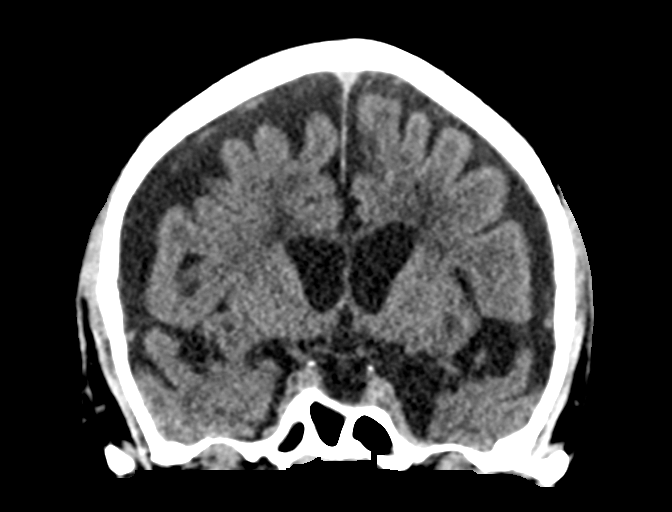
[im 37/67  brain]
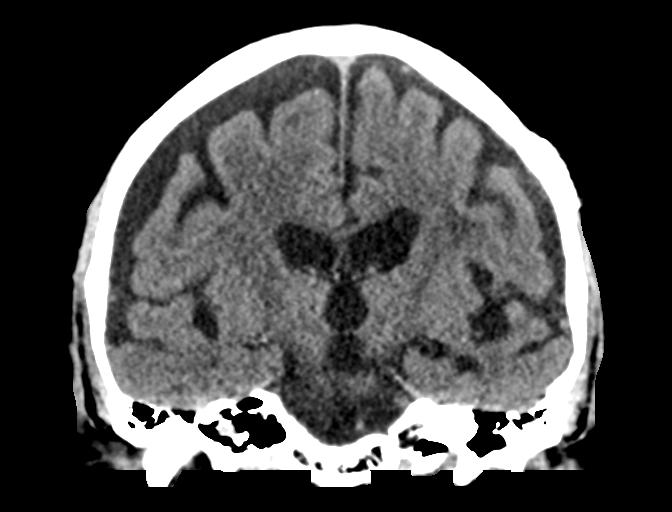

[Series 6: head without sag · sagittal · non-contrast · 0.29mm/px · 3 of 67 slices shown]
[im 23/67  brain]
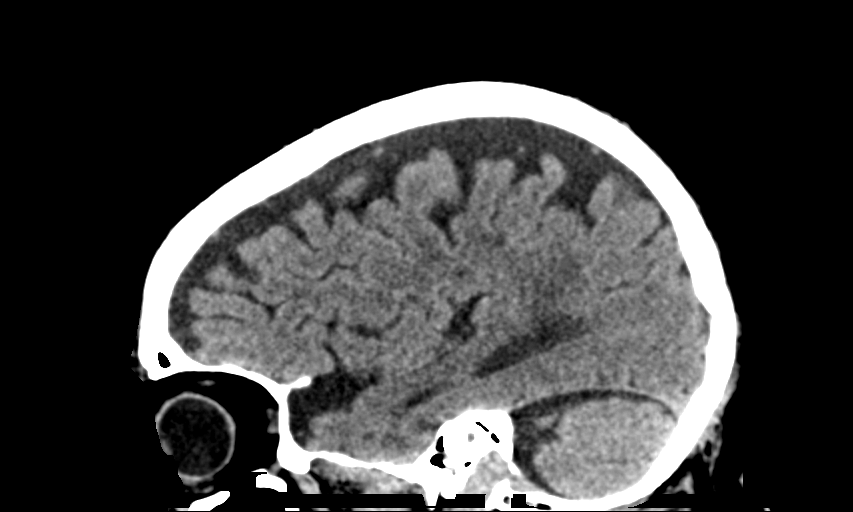
[im 34/67  brain]
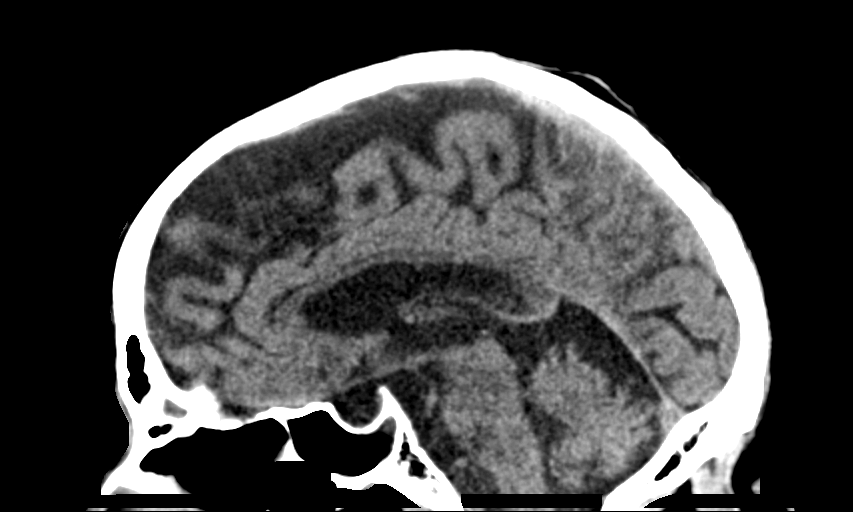
[im 45/67  brain]
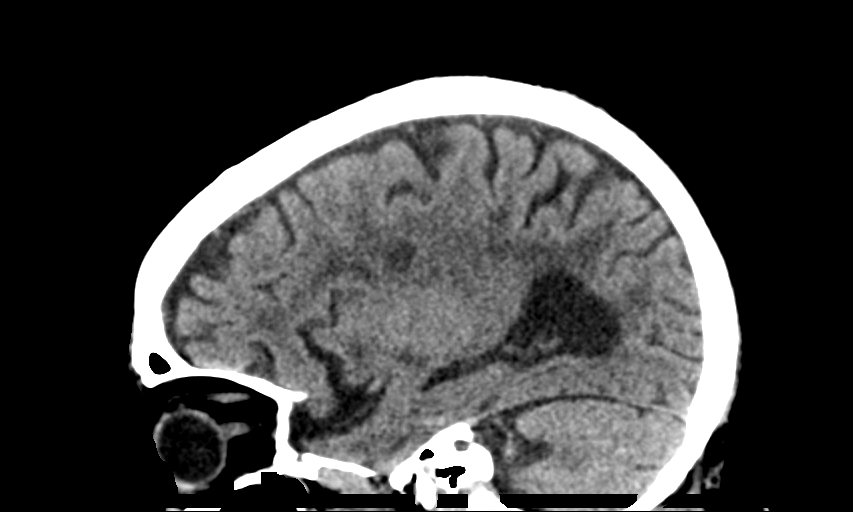

[14 of 47 positions shown; findings below may reference images not displayed]

FINDINGS: CT HEAD FINDINGS

Brain: There is no evidence for acute hemorrhage, hydrocephalus,
mass lesion, or abnormal extra-axial fluid collection. No definite
CT evidence for acute infarction. Diffuse loss of parenchymal volume
is consistent with atrophy. Patchy low attenuation in the deep
hemispheric and periventricular white matter is nonspecific, but
likely reflects chronic microvascular ischemic demyelination.

Vascular: No hyperdense vessel or unexpected calcification.

Skull: No evidence for fracture. No worrisome lytic or sclerotic
lesion. Left parietal craniotomy defect evident.

Other: None.
IMPRESSION: 1. No acute intracranial findings.
2. Atrophy with chronic small vessel white matter ischemic disease.

## 2022-05-01 MED ORDER — OXYCODONE-ACETAMINOPHEN 5-325 MG PO TABS: 1.0000 | ORAL_TABLET | ORAL | 0 refills | Status: DC | PRN

## 2022-05-01 MED ORDER — CLINDAMYCIN HCL 300 MG PO CAPS: 300.0000 mg | ORAL_CAPSULE | Freq: Three times a day (TID) | ORAL | 0 refills | Status: AC

## 2022-05-01 MED ORDER — OXYCODONE-ACETAMINOPHEN 5-325 MG PO TABS
2.0000 | ORAL_TABLET | Freq: Once | ORAL | Status: AC
  Administered 2022-05-01: 2 via ORAL
  Filled 2022-05-01: qty 2

## 2022-05-01 NOTE — ED Triage Notes (Signed)
Pt arrived POV from home c/o left ear pain. Per pts wife it is the entire left side of his head and he had an appointment with neurology but she said he woke up screaming about his ear this morning so she needed to bring him here.  ?

## 2022-05-01 NOTE — ED Notes (Signed)
Pt wheeled to waiting room. Pt verbalized understanding of discharge instructions.   

## 2022-05-01 NOTE — ED Provider Notes (Signed)
?[facility] ?Provider Note ? ? ?CSN: [id number] ?Arrival date & time: 05/01/22  0831 ? ?  ? ?History ? ?Chief Complaint  ?Patient presents with  ? Otalgia  ? ? ?Lonnie [name] is a 71 y.o. male. ? ?Pt complains of sever pain to the left side of his face.  Pt had teeth extracted last week.  Pt reports severe pain in his left ear and his face.  Pt reports he also recently had a fall and hit his head.  Pt complains of a headache.   ? ?The history is provided by the patient and the spouse. No language interpreter was used.  ?Otalgia ?Location:  Left ?Quality:  Aching ?Severity:  Moderate ?Onset quality:  Gradual ?Timing:  Constant ?Progression:  Worsening ?Chronicity:  New ?Context: not recent URI   ?Relieved by:  Nothing ?Worsened by:  Nothing ?Ineffective treatments:  None tried ?Associated symptoms: no fever and no neck pain   ? ?  ? ?Home Medications ?Prior to Admission medications   ?Medication Sig Start Date End Date Taking? Authorizing Provider  ?clindamycin (CLEOCIN) 300 MG capsule Take 1 capsule (300 mg total) by mouth 3 (three) times daily for 10 days. 05/01/22 05/11/22 Yes Elson AreasSofia, Schuyler Behan K, PA-C  ?oxyCODONE-acetaminophen (PERCOCET) 5-325 MG tablet Take 1 tablet by mouth every 4 (four) hours as needed for severe pain. 05/01/22 05/01/23 Yes Elson AreasSofia, Amay Mijangos K, PA-C  ?acetaminophen (TYLENOL) 500 MG tablet Take 1,000 mg by mouth in the morning and at bedtime.    [provider]  ?aspirin EC 81 MG tablet Take 81 mg by mouth in the morning.    [provider]  ?atorvastatin (LIPITOR) 80 MG tablet Take 1 tablet (80 mg total) by mouth daily. 03/17/22   Wanda PlumpPaz, Jose E, MD  ?Calcium Carb-Cholecalciferol (CALCIUM 600+D3 PO) Take 1 tablet by mouth daily with breakfast.    [provider]  ?citalopram (CELEXA) 20 MG tablet Take 0.5 tablets (10 mg total) by mouth 2 (two) times daily. 03/17/22   Wanda PlumpPaz, Jose E, MD  ?clonazePAM (KLONOPIN) 0.25 MG disintegrating tablet Take  1 tablet (0.25 mg total) by mouth 2 (two) times daily as needed (anxiety). 04/25/22   Wanda PlumpPaz, Jose E, MD  ?clopidogrel (PLAVIX) 75 MG tablet Take 1 tablet (75 mg total) by mouth daily. 03/17/22   Wanda PlumpPaz, Jose E, MD  ?docusate sodium (COLACE) 100 MG capsule Take 100 mg by mouth in the morning.    [provider]  ?Ferrous Fumarate (HEMOCYTE - 106 MG FE) 324 (106 Fe) MG TABS tablet Take 1 tablet (106 mg of iron total) by mouth daily. 08/22/21   Wanda PlumpPaz, Jose E, MD  ?furosemide (LASIX) 20 MG tablet Take 1 tablet (20 mg total) by mouth daily as needed for edema. 03/17/22   Wanda PlumpPaz, Jose E, MD  ?gabapentin (NEURONTIN) 300 MG capsule Take 1 capsule (300 mg total) by mouth 3 (three) times daily. 04/07/22   Marita KansasAli, Amjad, PA-C  ?irbesartan (AVAPRO) 300 MG tablet Take 1 tablet (300 mg total) by mouth daily. 03/17/22   Wanda PlumpPaz, Jose E, MD  ?melatonin 3 MG TABS tablet Take 2 mg by mouth at bedtime.    [provider]  ?Menthol, Topical Analgesic, (PAIN RELIEVING PATCH ULTRA ST EX) Apply 1 patch topically daily as needed (pain).    [provider]  ?metoprolol tartrate (LOPRESSOR) 25 MG tablet Take 0.5 tablets (12.5 mg total) by mouth 2 (two) times daily. 03/17/22   Wanda PlumpPaz, Jose E, MD  ?  Multiple Vitamin (MULTIVITAMIN WITH MINERALS) TABS tablet Take 1 tablet by mouth in the morning.    [provider]  ?oxyCODONE 10 MG TABS Take 1 tablet (10 mg total) by mouth every 4 (four) hours as needed for severe pain. 04/13/22   Dione Booze, MD  ?pantoprazole (PROTONIX) 40 MG tablet Take 1 tablet (40 mg total) by mouth at bedtime. 03/17/22   [name], MD  ?QUEtiapine (SEROQUEL) 200 MG tablet Take 1 tablet (200 mg total) by mouth 2 (two) times daily. 03/17/22   [name], MD  ?tamsulosin (FLOMAX) 0.4 MG CAPS capsule Take 1 capsule (0.4 mg total) by mouth daily after supper. 03/17/22   [name], MD  ?   ? ?Allergies    ?Ace inhibitors, Eggs or egg-derived products, Lactose intolerance (gi), and Latex   ? ?Review of Systems   ?Review  of Systems  ?Constitutional:  Negative for fever.  ?HENT:  Positive for ear pain.   ?Musculoskeletal:  Negative for neck pain.  ?All other systems reviewed and are negative. ? ?Physical Exam ?Updated Vital Signs ?BP (!) 161/85 (BP Location: Left Arm)   Pulse 63   Temp 98 ?F (36.7 ?C) (Oral)   Resp 20   Ht 6\' 2"  (1.88 m)   Wt 91.6 kg   SpO2 97%   BMI 25.94 kg/m?  ?Physical Exam ?Vitals and nursing note reviewed.  ?Constitutional:   ?   General: He is not in acute distress. ?   Appearance: He is well-developed.  ?HENT:  ?   Head: Normocephalic and atraumatic.  ?   Right Ear: Tympanic membrane normal.  ?   Left Ear: Tympanic membrane normal.  ?   Mouth/Throat:  ?   Mouth: Mucous membranes are moist.  ?   Comments: Swelling extraction site.  No obvious abscess.  Tender left face to palpation. ?Eyes:  ?   Conjunctiva/sclera: Conjunctivae normal.  ?Cardiovascular:  ?   Rate and Rhythm: Normal rate and regular rhythm.  ?   Heart sounds: No murmur heard. ?Pulmonary:  ?   Effort: Pulmonary effort is normal. No respiratory distress.  ?   Breath sounds: Normal breath sounds.  ?Abdominal:  ?   Palpations: Abdomen is soft.  ?Musculoskeletal:     ?   General: No swelling.  ?   Cervical back: Neck supple.  ?Skin: ?   General: Skin is warm and dry.  ?   Capillary Refill: Capillary refill takes less than 2 seconds.  ?Neurological:  ?   Mental Status: He is alert.  ?Psychiatric:     ?   Mood and Affect: Mood normal.  ? ? ?ED Results / Procedures / Treatments   ?Labs ?(all labs ordered are listed, but only abnormal results are displayed) ?Labs Reviewed - No data to display ? ?EKG ?None ? ?Radiology ?CT Head Wo Contrast ? ?Result Date: 05/01/2022 ?CLINICAL DATA:  Multiple falls.  Jaw pain. EXAM: CT HEAD WITHOUT CONTRAST TECHNIQUE: Multidetector CT imaging of the head was performed using the standard protocol without intravenous contrast. RADIATION DOSE REDUCTION: This exam was performed according to the departmental  dose-optimization program which includes automated exposure control, adjustment of the mA and/or kV according to patient size and/or use of iterative reconstruction technique. COMPARISON:  Head and maxillofacial CT 04/07/2022. Brain MRI 04/23/2022. FINDINGS: CT HEAD FINDINGS Brain: There is no evidence for acute hemorrhage, hydrocephalus, mass lesion, or abnormal extra-axial fluid collection. No definite CT evidence for acute infarction. Diffuse loss  of parenchymal volume is consistent with atrophy. Patchy low attenuation in the deep hemispheric and periventricular white matter is nonspecific, but likely reflects chronic microvascular ischemic demyelination. Vascular: No hyperdense vessel or unexpected calcification. Skull: No evidence for fracture. No worrisome lytic or sclerotic lesion. Left parietal craniotomy defect evident. Other: None. IMPRESSION: 1. No acute intracranial findings. 2. Atrophy with chronic small vessel white matter ischemic disease. Electronically Signed   By: Kennith Center M.D.   On: 05/01/2022 12:37  ? ?CT Maxillofacial Wo Contrast ? ?Result Date: 05/01/2022 ?CLINICAL DATA:  Facial trauma, blunt Pt has had multiple falls, jaw pain, also recent dental extraction EXAM: CT MAXILLOFACIAL WITHOUT CONTRAST TECHNIQUE: Multidetector CT imaging of the maxillofacial structures was performed. Multiplanar CT image reconstructions were also generated. RADIATION DOSE REDUCTION: This exam was performed according to the departmental dose-optimization program which includes automated exposure control, adjustment of the mA and/or kV according to patient size and/or use of iterative reconstruction technique. COMPARISON:  None Available. FINDINGS: Osseous: No fracture or mandibular dislocation. No destructive process. Multiple probably recently extracted left maxillary teeth posteriorly with overlying soft tissue thickening, potentially infectious/inflammatory. This region is mildly heterogeneous without discrete  drainable fluid collection. Orbits: Negative. No traumatic or inflammatory finding. Sinuses: Moderate paranasal sinus mucosal thickening, involving all sinuses and greatest in the ethmoid air cells. Retention cysts in

## 2022-05-01 NOTE — ED Notes (Signed)
Pt transported to CT ?

## 2022-05-01 NOTE — Discharge Instructions (Addendum)
See your Dentist for recheck  °

## 2022-05-04 NOTE — Progress Notes (Signed)
? ?NEUROLOGY CONSULTATION NOTE ? ?Lonnie [name] ?MRN: [id number] ?DOB: Jul 05, 1951  ? ?Referring provider: Sharlene Dory, DO ?[address] ?[address] ?[place],  [place] [address]  ?Primary care provider: [name], MD  ? ?Reason for consult:  L trigeminal pain  ? ?Assessment/Plan:  ? ?This is a very pleasant 71 year old RH man with a history of frontotemporal dementia (patient does not wish to be treated for this), TBI in 2020, anxiety, ICM, chronic combined systolic and diastolic CHF, prediabetes, history of a stroke, prior tobacco, depression, hypertension, CAD, who underwent a dental extraction on May 11, and since that time, he had significant pain in the region, initially felt to be due to TMJ.  He was noted also to have an infection in the area, and is taking antibiotics with some help.  The patient is currently on gabapentin 100 mg 3 times daily.  Apparently, when he presented to the hospital, upon discharge he was given gabapentin 300 mg 3 times daily, then decreased to 100 mg 3 times daily during the last week, but narcotic medication and NSAIDs were added to the regimen with some improvement of the symptoms.  During this visit, during physical exam, this area was examined, and palpated, and the patient states that the pain is actually controlled at this moment.  Because of suspicion of trigeminal neuralgia, carbamazepine 150 mg twice daily was to be added.  However, patient prefers to increase his gabapentin to 300 mg 3 times daily, and "wait a little ", before starting any prescription.  These decision is prudent, does this have the recommendations ? ? ?Increase Gabapentin to 300 mg tid, as priorly prescribed and for resolution of the symptoms. ?Follow up in 1 week  ?His symptoms do not improve or resolve, will initiate carbamazepine 100 g twice daily, with close monitoring of sodium levels 10 days after initiation. ? ? ?Subjective:  ?Onset: After dental extraction on May 11 ?Quality:  Although he is able to open his mouth widely, his pain is pulsating, "comes and quickly goes away ". ?Location: Left TMJ area, extending to above the ear ?Associated symptoms: No nausea ?Aura: No ?Activity: No worsening with activity such as chewing ?Aggravating factors: Cold air is bothersome ?Relieving factors: Pain medications and gabapentin. ? ?Current abortive medications: Gabapentin 100 mg 3 times daily and oxycodone as needed ?Current prophylactic medications: None ? ?Smoker: Denies ?Alcohol: Denies ?Caffeine: Denies ?Sleep: Denies any issues ?  ? ? ?PAST MEDICAL HISTORY: ?Past Medical History:  ?Diagnosis Date  ? Allergy   ? Anxiety   ? Arthritis   ? left neck, shoulder, knee  ? CAD (coronary artery disease)   ? a. anterior STEMI 10/2013 s/p 4V CABG with LIMA to mid LAD, SVG to OM, SVG to PDA, SVG to Diagonal.  ? Chronic combined systolic and diastolic CHF (congestive heart failure) (HCC)   ? Dementia (HCC)   ? Depression   ? Falls   ? Hypertension   ? Ischemic cardiomyopathy   ? a. EF 40-45% at time of CABG and in 2018.  ? MI (myocardial infarction) (HCC)   ? Peripheral neuropathy   ? Prediabetes 09/01/2014  ? PVD (peripheral vascular disease) (HCC)   ? a. s/p L SFA stents with now known bilateral SFA. b. right femoral to below the knee bypass by Dr. Myra Gianotti in 12/2019, c/b infection 02/2020.  ? Stroke [facility])   ? seen on CT Scan  ? Subdural hematoma (HCC)   ? Tobacco abuse   ?  UTI (urinary tract infection)   ? ? ?PAST SURGICAL HISTORY: ?Past Surgical History:  ?Procedure Laterality Date  ? ABDOMINAL AORTAGRAM N/A 06/07/2014  ? Procedure: ABDOMINAL AORTAGRAM;  Surgeon: Iran Ouch, MD;  Location[facility] CATH LAB;  Service: Cardiovascular;  Laterality: N/A;  ? ABDOMINAL AORTAGRAM N/A 03/07/2015  ? Procedure: ABDOMINAL AORTAGRAM;  Surgeon: Iran Ouch, MD;  Location: [facility] CATH LAB;  Service: Cardiovascular;  Laterality: N/A;  ? ABDOMINAL AORTOGRAM W/LOWER EXTREMITY N/A 01/18/2020  ? Procedure: ABDOMINAL AORTOGRAM  W/LOWER EXTREMITY - Right;  Surgeon: Iran Ouch, MD;  Location: [facility] INVASIVE CV LAB;  Service: Cardiovascular;  Laterality: N/A;  ? ABDOMINAL AORTOGRAM W/LOWER EXTREMITY Left 12/31/2021  ? Procedure: ABDOMINAL AORTOGRAM W/LOWER EXTREMITY;  Surgeon: Nada Libman, MD;  Location: [facility] INVASIVE CV LAB;  Service: Cardiovascular;  Laterality: Left;  ? AMPUTATION Right 07/02/2021  ? Procedure: RIGHT ABOVE KNEE AMPUTATION;  Surgeon: Sherren Kerns, MD;  Location: [facility] OR;  Service: Vascular;  Laterality: Right;  ? ANGIOPLASTY Right 06/08/2021  ? Procedure: BALLOON ANGIOPLASTY;  Surgeon: Nada Libman, MD;  Location: [facility] OR;  Service: Vascular;  Laterality: Right;  ? APPENDECTOMY    ? APPLICATION OF WOUND VAC Right 06/27/2021  ? Procedure: APPLICATION OF WOUND VAC;  Surgeon: Cephus Shelling, MD;  Location: [facility] OR;  Service: Vascular;  Laterality: Right;  ? COLONOSCOPY WITH PROPOFOL N/A 10/11/2015  ? Procedure: COLONOSCOPY WITH PROPOFOL;  Surgeon: Charna Elizabeth, MD;  Location: [facility] ENDOSCOPY;  Service: Endoscopy;  Laterality: N/A;  ? CORONARY ARTERY BYPASS GRAFT N/A 11/04/2013  ? Procedure: CORONARY ARTERY BYPASS GRAFTING (CABG) TIMES FOUR  USING LEFT INTERNAL MAMMARY ARTERY AND RIGHT AND LEFT SAPHENOUS LEG VEIN HARVESTED ENDOSCOPICALLY;  Surgeon: Loreli Slot, MD;  [facility] OR;  Service: Open Heart Surgery;  Laterality: N/A;  ? CRANIOTOMY Left 06/30/2019  ? Procedure: Frontal CRANIOTOMY HEMATOMA EVACUATION SUBDURAL;  Surgeon: Lisbeth Renshaw, MD;  Location: [facility] OR;  Service: Neurosurgery;  Laterality: Left;  Frontal CRANIOTOMY HEMATOMA EVACUATION SUBDURAL  ? FEMORAL-POPLITEAL BYPASS GRAFT Right 01/20/2020  ? Procedure: BYPASS GRAFT FEMORAL-POPLITEAL ARTERY RIGHT LEG USING GORE PROPATEN GRAFT;  Surgeon: Nada Libman, MD;  Location: [facility] OR;  Service: Vascular;  Laterality: Right;  ? FINGER SURGERY  2017  ? injury  ? I & D EXTREMITY Right 03/02/2020  ? Procedure: IRRIGATION AND DEBRIDEMENT EXTREMITY right   lower leg  with Antibiotic beads.;  Surgeon: Chuck Hint, MD;  Location: [facility] OR;  Service: Vascular;  Laterality: Right;  ? I & D EXTREMITY Right 06/27/2021  ? Procedure: IRRIGATION AND DEBRIDEMENT OF RIGHT LEG;  Surgeon: Cephus Shelling, MD;  Location: [facility] OR;  Service: Vascular;  Laterality: Right;  ? INSERTION OF ILIAC STENT Right 06/08/2021  ? Procedure: INSERTION OF RIGHT UPPER LEG STENT;  Surgeon: Nada Libman, MD;  Location: [facility] OR;  Service: Vascular;  Laterality: Right;  ? KNEE SURGERY    ? fractured patella  ? LEFT HEART CATH N/A 10/29/2013  ? Procedure: LEFT HEART CATH;  Surgeon: Kathleene Hazel, MD;  Location: [facility] CATH LAB;  Service: Cardiovascular;  Laterality: N/A;  ? LEFT HEART CATHETERIZATION WITH CORONARY ANGIOGRAM N/A 10/31/2013  ? Procedure: LEFT HEART CATHETERIZATION WITH CORONARY ANGIOGRAM;  Surgeon: Kathleene Hazel, MD;  Location: [facility] CATH LAB;  Service: Cardiovascular;  Laterality: N/A;  ? LIGATION OF CILIAC ARTERY Right 06/27/2021  ? Procedure: LIGATION OF POPITEAL ARTERY;  Surgeon: Cephus Shelling, MD;  Location: [facility] OR;  Service:  Vascular;  Laterality: Right;  ? LOWER EXTREMITY ANGIOGRAPHY Right 06/07/2021  ? Procedure: Lower Extremity Angiography;  Surgeon: Sherren KernsFields, Charles E, MD;  [facility];  Service: Cardiovascular;  Laterality: Right;  W/ ABD   ? MOUTH SURGERY    ? PATCH ANGIOPLASTY Right 07/08/2021  ? Procedure: PATCH ANGIOPLASTY WITH 1X6 Kathleen LimeXENOSURE PATCH;  Surgeon: Sherren KernsFields, Charles E, MD;  [facility];  Service: Vascular;  Laterality: Right;  ? PERIPHERAL VASCULAR THROMBECTOMY Right 06/07/2021  ? Procedure: PERIPHERAL VASCULAR THROMBECTOMY;  Surgeon: Sherren KernsFields, Charles E, MD;  [facility];  Service: Cardiovascular;  Laterality: Right;  ? REMOVAL OF GRAFT Right 07/08/2021  ? Procedure: REMOVAL RIGHT FEMORAL BYPASS GRAFT;  Surgeon: Sherren KernsFields, Charles E, MD;  [facility];  Service: Vascular;  Laterality: Right;  ? TOE SURGERY    ?  VEIN REPAIR Right 06/27/2021  ? Procedure: REPAIR OF POPITEAL VEIN;  Surgeon: Cephus Shellinglark, Christopher J, MD;  [facility];  Service: Vascular;  Laterality: Right;  ? ? ?MEDICATIONS: ?Current Outpatient Medic

## 2022-05-05 ENCOUNTER — Ambulatory Visit: Payer: Medicare HMO | Admitting: Physician Assistant

## 2022-05-05 ENCOUNTER — Encounter: Payer: Self-pay | Admitting: Physician Assistant

## 2022-05-05 VITALS — BP 134/64 | HR 66 | Ht 74.0 in

## 2022-05-05 DIAGNOSIS — G500 Trigeminal neuralgia: Secondary | ICD-10-CM

## 2022-05-05 MED ORDER — CARBAMAZEPINE 200 MG PO TABS: ORAL_TABLET | ORAL | 1 refills | Status: DC

## 2022-05-05 NOTE — Patient Instructions (Addendum)
?  Continue gabapentin 300 mg three times a day  ?Follow up in 1 week ? ? ?

## 2022-05-12 ENCOUNTER — Ambulatory Visit: Payer: Medicare HMO | Admitting: Physician Assistant

## 2022-05-12 ENCOUNTER — Encounter: Payer: Self-pay | Admitting: Physician Assistant

## 2022-05-12 DIAGNOSIS — Z029 Encounter for administrative examinations, unspecified: Secondary | ICD-10-CM

## 2022-05-12 NOTE — Progress Notes (Incomplete)
NEUROLOGY FOLLOW UP OFFICE NOTE  Lonnie [name] [id number]  Assessment/Plan:   This is a very pleasant 71 year old RH man with a history of frontotemporal dementia (patient does not wish to be treated for this), TBI in 2020, anxiety, ICM, chronic combined systolic and diastolic CHF, prediabetes, history of a stroke, prior tobacco, depression, hypertension, CAD, who underwent a dental extraction on May 11, and since that time, he had significant pain in the region, initially felt to be due to TMJ.  He was noted also to have an infection in the area, and is taking antibiotics with some help.  The patient is currently on gabapentin 100 mg 3 times daily.  Apparently, when he presented to the hospital, upon discharge he was given gabapentin 300 mg 3 times daily, then decreased to 100 mg 3 times daily during the last week, but narcotic medication and NSAIDs were added to the regimen with some improvement of the symptoms.  During this visit, during physical exam, this area was examined, and palpated, and the patient states that the pain is actually controlled at this moment.  Because of suspicion of trigeminal neuralgia, carbamazepine 150 mg twice daily was to be added.  However, patient prefers to increase his gabapentin to 300 mg 3 times daily, and "wait a little ", before starting any prescription.  These decision is prudent, does this have the recommendations   Increase Gabapentin to 300 mg tid, as priorly prescribed and for resolution of the symptoms. Follow up in 1 week  His symptoms do not improve or resolve, will initiate carbamazepine 100 g twice daily, with close monitoring of sodium levels 10 days after initiation.  Subjective:  *** Onset: After dental extraction on May 11 Quality: Although he is able to open his mouth widely, his pain is pulsating, "comes and quickly goes away ". Location: Left TMJ area, extending to above the ear Associated symptoms: No nausea Aura: No Activity: No  worsening with activity such as chewing Aggravating factors: Cold air is bothersome Relieving factors: Pain medications and gabapentin.   Current abortive medications: Gabapentin 100 mg 3 times daily and oxycodone as needed Current prophylactic medications: None   Smoker: Denies Alcohol: Denies Caffeine: Denies Sleep: Denies any issues   PAST MEDICAL HISTORY: Past Medical History:  Diagnosis Date   Allergy    Anxiety    Arthritis    left neck, shoulder, knee   CAD (coronary artery disease)    a. anterior STEMI 10/2013 s/p 4V CABG with LIMA to mid LAD, SVG to OM, SVG to PDA, SVG to Diagonal.   Chronic combined systolic and diastolic CHF (congestive heart failure) (HCC)    Dementia (HCC)    Depression    Falls    Hypertension    Ischemic cardiomyopathy    a. EF 40-45% at time of CABG and in 2018.   MI (myocardial infarction) (HCC)    Peripheral neuropathy    Prediabetes 09/01/2014   PVD (peripheral vascular disease) (HCC)    a. s/p L SFA stents with now known bilateral SFA. b. right femoral to below the knee bypass by Dr. Myra Gianotti in 12/2019, c/b infection 02/2020.   Stroke [facility])    seen on CT Scan   Subdural hematoma (HCC)    Tobacco abuse    UTI (urinary tract infection)     MEDICATIONS: Current Outpatient Medications on File Prior to Visit  Medication Sig Dispense Refill   acetaminophen (TYLENOL) 500 MG tablet Take 1,000 mg by mouth in  the morning and at bedtime.     aspirin EC 81 MG tablet Take 81 mg by mouth in the morning.     atorvastatin (LIPITOR) 80 MG tablet Take 1 tablet (80 mg total) by mouth daily. 90 tablet 1   Calcium Carb-Cholecalciferol (CALCIUM 600+D3 PO) Take 1 tablet by mouth daily with breakfast.     citalopram (CELEXA) 20 MG tablet Take 0.5 tablets (10 mg total) by mouth 2 (two) times daily. 90 tablet 1   clonazePAM (KLONOPIN) 0.25 MG disintegrating tablet Take 1 tablet (0.25 mg total) by mouth 2 (two) times daily as needed (anxiety). 180 tablet 0    clopidogrel (PLAVIX) 75 MG tablet Take 1 tablet (75 mg total) by mouth daily. 90 tablet 1   docusate sodium (COLACE) 100 MG capsule Take 100 mg by mouth in the morning.     Ferrous Fumarate (HEMOCYTE - 106 MG FE) 324 (106 Fe) MG TABS tablet Take 1 tablet (106 mg of iron total) by mouth daily. 90 tablet 1   furosemide (LASIX) 20 MG tablet Take 1 tablet (20 mg total) by mouth daily as needed for edema. 90 tablet 0   gabapentin (NEURONTIN) 300 MG capsule Take 1 capsule (300 mg total) by mouth 3 (three) times daily. (Patient taking differently: Take 100 mg by mouth 3 (three) times daily. 100 mg 3 x daily) 90 capsule 0   irbesartan (AVAPRO) 300 MG tablet Take 1 tablet (300 mg total) by mouth daily. 90 tablet 1   melatonin 3 MG TABS tablet Take 2 mg by mouth at bedtime.     Menthol, Topical Analgesic, (PAIN RELIEVING PATCH ULTRA ST EX) Apply 1 patch topically daily as needed (pain). (Patient not taking: Reported on 05/05/2022)     metoprolol tartrate (LOPRESSOR) 25 MG tablet Take 0.5 tablets (12.5 mg total) by mouth 2 (two) times daily. 90 tablet 1   Multiple Vitamin (MULTIVITAMIN WITH MINERALS) TABS tablet Take 1 tablet by mouth in the morning.     oxyCODONE 10 MG TABS Take 1 tablet (10 mg total) by mouth every 4 (four) hours as needed for severe pain. 15 tablet 0   oxyCODONE-acetaminophen (PERCOCET) 5-325 MG tablet Take 1 tablet by mouth every 4 (four) hours as needed for severe pain. (Patient not taking: Reported on 05/05/2022) 20 tablet 0   pantoprazole (PROTONIX) 40 MG tablet Take 1 tablet (40 mg total) by mouth at bedtime. 90 tablet 1   QUEtiapine (SEROQUEL) 200 MG tablet Take 1 tablet (200 mg total) by mouth 2 (two) times daily. 180 tablet 1   tamsulosin (FLOMAX) 0.4 MG CAPS capsule Take 1 capsule (0.4 mg total) by mouth daily after supper. 180 capsule 1   No current facility-administered medications on file prior to visit.    ALLERGIES: Allergies  Allergen Reactions   Ace Inhibitors Swelling  and Other (See Comments)    Angioedema   Eggs Or Egg-Derived Products Nausea Only and Other (See Comments)    Cannot eat prepared eggs- Occasional nausea   Lactose Intolerance (Gi) Diarrhea, Nausea Only and Other (See Comments)    Flatulence, also   Latex Itching    FAMILY HISTORY: Family History  Problem Relation Age of Onset   CAD Father 43   AAA (abdominal aortic aneurysm) Father    Alcohol abuse Father    Hypertension Father    Diabetes Father    Heart attack Father    Stroke Mother    Arthritis Mother    Heart disease Mother  Hypertension Mother    Heart attack Mother    Cardiomyopathy Daughter    Colon cancer Neg Hx    Prostate cancer Neg Hx    ***.   Objective:  *** General: No acute distress.  Patient appears *** groomed.   Head:  Normocephalic/atraumatic Eyes:  Fundi examined but not visualized Neck: supple, no paraspinal tenderness, full range of motion Heart:  Regular rate and rhythm Lungs:  Clear to auscultation bilaterally Back: No paraspinal tenderness Neurological Exam: alert and oriented to person, place, and time. Attention span and concentration intact, recent and remote memory intact, fund of knowledge intact.  Speech fluent and not dysarthric, language intact.  CN II-XII intact. Bulk and tone normal, muscle strength 5/5 throughout.  Sensation to light touch, temperature and vibration intact.  Deep tendon reflexes 2+ throughout, toes downgoing.  Finger to nose and heel to shin testing intact.  Gait normal, Romberg negative.     Shon MilletAdam Jaffe, DO  CC: ***

## 2022-05-29 ENCOUNTER — Other Ambulatory Visit: Payer: Self-pay | Admitting: Internal Medicine

## 2022-05-29 DIAGNOSIS — M25473 Effusion, unspecified ankle: Secondary | ICD-10-CM

## 2022-06-20 DIAGNOSIS — G300 Alzheimer's disease with early onset: Secondary | ICD-10-CM | POA: Diagnosis not present

## 2022-06-20 DIAGNOSIS — F02C18 Dementia in other diseases classified elsewhere, severe, with other behavioral disturbance: Secondary | ICD-10-CM | POA: Diagnosis not present

## 2022-06-20 DIAGNOSIS — G3109 Other frontotemporal dementia: Secondary | ICD-10-CM | POA: Diagnosis not present

## 2022-06-20 DIAGNOSIS — F419 Anxiety disorder, unspecified: Secondary | ICD-10-CM | POA: Diagnosis not present

## 2022-07-04 ENCOUNTER — Other Ambulatory Visit: Payer: Self-pay | Admitting: Surgery

## 2022-07-04 DIAGNOSIS — I70245 Atherosclerosis of native arteries of left leg with ulceration of other part of foot: Secondary | ICD-10-CM

## 2022-07-09 ENCOUNTER — Other Ambulatory Visit: Payer: Self-pay

## 2022-07-09 DIAGNOSIS — Z515 Encounter for palliative care: Secondary | ICD-10-CM

## 2022-07-10 NOTE — Progress Notes (Signed)
COMMUNITY PALLIATIVE CARE SW NOTE  PATIENT NAME: NILAN IDDINGS DOB: 11-22-1951 MRN: [id number]  PRIMARY CARE PROVIDER: [name], MD  RESPONSIBLE PARTY:  Acct ID - Guarantor Home Phone Work Phone Relationship Acct Type  [id number] Barron, Vanloan [phone]  Self P/F     [address], [place], [place] [address]   SOCIAL WORK TELEPHONIC ENCOUNTER  PC SW completed a telephonic visit with patient's daughter Stephan Minister, to assess patient status and needs. Stephan Minister advised that she is having major health issues. She is scheduled to have a major surgery in three weeks. She stated that her father would need to be placed as she will not be able to help her mother with patient. Felisha advised that patient's dementia is progressing and he requiring more assistance and supervision. SW advised her that she and her mother need to be sure they want placement as they have wavered back and forth previously. [facility] agreed and stated she will talk with her mother to make a final decision for placement.  No other concerns noted.   [address] [place], [place]

## 2022-08-10 ENCOUNTER — Other Ambulatory Visit: Payer: Self-pay | Admitting: Internal Medicine

## 2022-08-26 DIAGNOSIS — G3101 Pick's disease: Secondary | ICD-10-CM | POA: Diagnosis not present

## 2022-11-05 ENCOUNTER — Other Ambulatory Visit: Payer: Self-pay | Admitting: Internal Medicine

## 2022-11-05 DIAGNOSIS — M25473 Effusion, unspecified ankle: Secondary | ICD-10-CM

## 2022-12-20 ENCOUNTER — Emergency Department (HOSPITAL_COMMUNITY): Payer: Medicare HMO

## 2022-12-20 ENCOUNTER — Observation Stay (HOSPITAL_COMMUNITY)
Admission: EM | Admit: 2022-12-20 | Discharge: 2022-12-25 | Disposition: A | Discharge status: Skilled Nursing Facility | Payer: Medicare HMO | Attending: General Surgery | Admitting: General Surgery

## 2022-12-20 ENCOUNTER — Encounter (HOSPITAL_COMMUNITY): Payer: Self-pay

## 2022-12-20 ENCOUNTER — Inpatient Hospital Stay (HOSPITAL_COMMUNITY): Payer: Medicare HMO

## 2022-12-20 ENCOUNTER — Other Ambulatory Visit: Payer: Self-pay

## 2022-12-20 DIAGNOSIS — S299XXA Unspecified injury of thorax, initial encounter: Secondary | ICD-10-CM | POA: Diagnosis present

## 2022-12-20 DIAGNOSIS — Z7982 Long term (current) use of aspirin: Secondary | ICD-10-CM | POA: Diagnosis not present

## 2022-12-20 DIAGNOSIS — Z89611 Acquired absence of right leg above knee: Secondary | ICD-10-CM | POA: Insufficient documentation

## 2022-12-20 DIAGNOSIS — I2510 Atherosclerotic heart disease of native coronary artery without angina pectoris: Secondary | ICD-10-CM | POA: Diagnosis not present

## 2022-12-20 DIAGNOSIS — Z91040 Latex allergy status: Secondary | ICD-10-CM | POA: Insufficient documentation

## 2022-12-20 DIAGNOSIS — Z7902 Long term (current) use of antithrombotics/antiplatelets: Secondary | ICD-10-CM | POA: Insufficient documentation

## 2022-12-20 DIAGNOSIS — S2249XA Multiple fractures of ribs, unspecified side, initial encounter for closed fracture: Secondary | ICD-10-CM | POA: Diagnosis present

## 2022-12-20 DIAGNOSIS — Z87891 Personal history of nicotine dependence: Secondary | ICD-10-CM | POA: Diagnosis not present

## 2022-12-20 DIAGNOSIS — W182XXA Fall in (into) shower or empty bathtub, initial encounter: Secondary | ICD-10-CM | POA: Diagnosis not present

## 2022-12-20 DIAGNOSIS — Z8673 Personal history of transient ischemic attack (TIA), and cerebral infarction without residual deficits: Secondary | ICD-10-CM | POA: Diagnosis not present

## 2022-12-20 DIAGNOSIS — R2689 Other abnormalities of gait and mobility: Secondary | ICD-10-CM | POA: Diagnosis not present

## 2022-12-20 DIAGNOSIS — F0280 Dementia in other diseases classified elsewhere without behavioral disturbance: Secondary | ICD-10-CM | POA: Diagnosis not present

## 2022-12-20 DIAGNOSIS — S2241XA Multiple fractures of ribs, right side, initial encounter for closed fracture: Secondary | ICD-10-CM | POA: Diagnosis not present

## 2022-12-20 DIAGNOSIS — G309 Alzheimer's disease, unspecified: Secondary | ICD-10-CM | POA: Insufficient documentation

## 2022-12-20 DIAGNOSIS — W19XXXA Unspecified fall, initial encounter: Secondary | ICD-10-CM

## 2022-12-20 DIAGNOSIS — Z951 Presence of aortocoronary bypass graft: Secondary | ICD-10-CM | POA: Diagnosis not present

## 2022-12-20 DIAGNOSIS — I110 Hypertensive heart disease with heart failure: Secondary | ICD-10-CM | POA: Diagnosis not present

## 2022-12-20 DIAGNOSIS — Z79899 Other long term (current) drug therapy: Secondary | ICD-10-CM | POA: Diagnosis not present

## 2022-12-20 DIAGNOSIS — I5042 Chronic combined systolic (congestive) and diastolic (congestive) heart failure: Secondary | ICD-10-CM | POA: Diagnosis not present

## 2022-12-20 LAB — MAGNESIUM: Magnesium: 2 mg/dL (ref 1.7–2.4)

## 2022-12-20 LAB — CBC
HCT: 40.1 % (ref 39.0–52.0)
Hemoglobin: 13 g/dL (ref 13.0–17.0)
MCH: 26.5 pg (ref 26.0–34.0)
MCHC: 32.4 g/dL (ref 30.0–36.0)
MCV: 81.8 fL (ref 80.0–100.0)
Platelets: 295 10*3/uL (ref 150–400)
RBC: 4.9 MIL/uL (ref 4.22–5.81)
RDW: 14.5 % (ref 11.5–15.5)
WBC: 12.6 10*3/uL — ABNORMAL HIGH (ref 4.0–10.5)
nRBC: 0 % (ref 0.0–0.2)

## 2022-12-20 LAB — CBC WITH DIFFERENTIAL/PLATELET
Abs Immature Granulocytes: 0.08 10*3/uL — ABNORMAL HIGH (ref 0.00–0.07)
Basophils Absolute: 0.1 10*3/uL (ref 0.0–0.1)
Basophils Relative: 1 %
Eosinophils Absolute: 0.3 10*3/uL (ref 0.0–0.5)
Eosinophils Relative: 2 %
HCT: 39.4 % (ref 39.0–52.0)
Hemoglobin: 12.6 g/dL — ABNORMAL LOW (ref 13.0–17.0)
Immature Granulocytes: 1 %
Lymphocytes Relative: 14 %
Lymphs Abs: 1.7 10*3/uL (ref 0.7–4.0)
MCH: 26.3 pg (ref 26.0–34.0)
MCHC: 32 g/dL (ref 30.0–36.0)
MCV: 82.1 fL (ref 80.0–100.0)
Monocytes Absolute: 0.8 10*3/uL (ref 0.1–1.0)
Monocytes Relative: 6 %
Neutro Abs: 9.4 10*3/uL — ABNORMAL HIGH (ref 1.7–7.7)
Neutrophils Relative %: 76 %
Platelets: 289 10*3/uL (ref 150–400)
RBC: 4.8 MIL/uL (ref 4.22–5.81)
RDW: 14.6 % (ref 11.5–15.5)
WBC: 12.3 10*3/uL — ABNORMAL HIGH (ref 4.0–10.5)
nRBC: 0 % (ref 0.0–0.2)

## 2022-12-20 LAB — COMPREHENSIVE METABOLIC PANEL
ALT: 28 U/L (ref 0–44)
AST: 23 U/L (ref 15–41)
Albumin: 3.8 g/dL (ref 3.5–5.0)
Alkaline Phosphatase: 106 U/L (ref 38–126)
Anion gap: 10 (ref 5–15)
BUN: 19 mg/dL (ref 8–23)
CO2: 24 mmol/L (ref 22–32)
Calcium: 9.4 mg/dL (ref 8.9–10.3)
Chloride: 104 mmol/L (ref 98–111)
Creatinine, Ser: 1.07 mg/dL (ref 0.61–1.24)
GFR, Estimated: >60 mL/min (ref >60)
Glucose, Bld: 163 mg/dL — ABNORMAL HIGH (ref 70–99)
Potassium: 3.9 mmol/L (ref 3.5–5.1)
Sodium: 138 mmol/L (ref 135–145)
Total Bilirubin: 0.8 mg/dL (ref 0.3–1.2)
Total Protein: 7.3 g/dL (ref 6.5–8.1)

## 2022-12-20 LAB — CREATININE, SERUM
Creatinine, Ser: 1.15 mg/dL (ref 0.61–1.24)
GFR, Estimated: >60 mL/min (ref >60)

## 2022-12-20 MED ORDER — OXYCODONE HCL 5 MG PO TABS
5.0000 mg | ORAL_TABLET | ORAL | Status: DC | PRN
  Administered 2022-12-21 – 2022-12-25 (×5): 5 mg via ORAL
  Filled 2022-12-20 (×5): qty 1

## 2022-12-20 MED ORDER — ONDANSETRON HCL 4 MG/2ML IJ SOLN: 4.0000 mg | Freq: Four times a day (QID) | INTRAMUSCULAR | Status: DC | PRN

## 2022-12-20 MED ORDER — ACETAMINOPHEN 325 MG PO TABS: 650.0000 mg | ORAL_TABLET | ORAL | Status: DC | PRN

## 2022-12-20 MED ORDER — ONDANSETRON 4 MG PO TBDP: 4.0000 mg | ORAL_TABLET | Freq: Four times a day (QID) | ORAL | Status: DC | PRN

## 2022-12-20 MED ORDER — METOPROLOL TARTRATE 5 MG/5ML IV SOLN: 5.0000 mg | Freq: Four times a day (QID) | INTRAVENOUS | Status: DC | PRN

## 2022-12-20 MED ORDER — HYDROMORPHONE HCL 1 MG/ML IJ SOLN
0.5000 mg | INTRAMUSCULAR | Status: DC | PRN
  Administered 2022-12-20 – 2022-12-22 (×4): 0.5 mg via INTRAVENOUS
  Filled 2022-12-20: qty 1
  Filled 2022-12-20 (×4): qty 0.5

## 2022-12-20 MED ORDER — ENOXAPARIN SODIUM 30 MG/0.3ML IJ SOSY
30.0000 mg | PREFILLED_SYRINGE | Freq: Two times a day (BID) | INTRAMUSCULAR | Status: DC
  Administered 2022-12-21 – 2022-12-25 (×9): 30 mg via SUBCUTANEOUS
  Filled 2022-12-20 (×9): qty 0.3

## 2022-12-20 MED ORDER — HYDROCODONE-ACETAMINOPHEN 5-325 MG PO TABS
1.0000 | ORAL_TABLET | Freq: Once | ORAL | Status: AC
  Administered 2022-12-20: 1 via ORAL
  Filled 2022-12-20: qty 1

## 2022-12-20 MED ORDER — MORPHINE SULFATE (PF) 2 MG/ML IV SOLN
2.0000 mg | INTRAVENOUS | Status: DC | PRN
  Administered 2022-12-21: 2 mg via INTRAVENOUS
  Administered 2022-12-21: 4 mg via INTRAVENOUS
  Filled 2022-12-20: qty 2
  Filled 2022-12-20: qty 1

## 2022-12-20 NOTE — ED Notes (Signed)
Transport called for patient  °

## 2022-12-20 NOTE — ED Notes (Signed)
ED TO INPATIENT HANDOFF REPORT  ED Nurse Name and Phone #: Farren Nelles RN   S Name/Age/Gender Lonnie [name] 71 y.o. male Room/Bed: WA25/WA25  Code Status   Code Status: Prior  Home/SNF/Other Home Patient oriented to: self, place, time, and situation Is this baseline? Yes   Triage Complete: Triage complete  Chief Complaint Rib fractures [S22.49XA]  Triage Note The patient fell 3 days. He was seen by [facility] and x rays were taken, though results have not come in yet. Today the patient's family wanted him seen due to the right rib pain.   HX: Dementia  EMS vitals: 160/100 BP 74 HR   Allergies Allergies  Allergen Reactions   Ace Inhibitors Swelling and Other (See Comments)    Angioedema   Eggs Or Egg-Derived Products Nausea Only and Other (See Comments)    Cannot eat prepared eggs- Occasional nausea   Lactose Intolerance (Gi) Diarrhea, Nausea Only and Other (See Comments)    Flatulence, also   Latex Itching    Level of Care/Admitting Diagnosis ED Disposition     ED Disposition  Admit   Condition  --   Comment  Hospital Area: [facility] [[id number]]  Level of Care: Med-Surg [16]  May admit patient to [facility] or [facility] if equivalent level of care is available:: No  Covid Evaluation: Asymptomatic - no recent exposure (last 10 days) testing not required  Diagnosis: Rib fractures [[id number]]  Admitting Physician: Rodman Pickle [[id number]]  Attending Physician: Rodman Pickle [[id number]]  Certification:: I certify this patient will need inpatient services for at least 2 midnights  Estimated Length of Stay: 2          B Medical/Surgery History Past Medical History:  Diagnosis Date   Allergy    Anxiety    Arthritis    left neck, shoulder, knee   CAD (coronary artery disease)    a. anterior STEMI 10/2013 s/p 4V CABG with LIMA to mid LAD, SVG to OM, SVG to PDA, SVG to Diagonal.   Chronic combined systolic and  diastolic CHF (congestive heart failure) (HCC)    Dementia (HCC)    Depression    Falls    Hypertension    Ischemic cardiomyopathy    a. EF 40-45% at time of CABG and in 2018.   MI (myocardial infarction) (HCC)    Peripheral neuropathy    Prediabetes 09/01/2014   PVD (peripheral vascular disease) (HCC)    a. s/p L SFA stents with now known bilateral SFA. b. right femoral to below the knee bypass by Dr. Myra Gianotti in 12/2019, c/b infection 02/2020.   Stroke [facility])    seen on CT Scan   Subdural hematoma (HCC)    Tobacco abuse    UTI (urinary tract infection)    Past Surgical History:  Procedure Laterality Date   ABDOMINAL AORTAGRAM N/A 06/07/2014   Procedure: ABDOMINAL Ronny Flurry;  Surgeon: Iran Ouch, MD;  Location: [facility] CATH LAB;  Service: Cardiovascular;  Laterality: N/A;   ABDOMINAL AORTAGRAM N/A 03/07/2015   Procedure: ABDOMINAL Ronny Flurry;  Surgeon: Iran Ouch, MD;  [facility]: [facility] CATH [facility];  Service: Cardiovascular;  Laterality: N/A;   ABDOMINAL AORTOGRAM W/LOWER EXTREMITY N/A 01/18/2020   Procedure: ABDOMINAL AORTOGRAM W/LOWER EXTREMITY - Right;  Surgeon: Iran Ouch, MD;  [facility]: [facility] INVASIVE CV LAB;  Service: Cardiovascular;  Laterality: N/A;   ABDOMINAL AORTOGRAM W/LOWER EXTREMITY Left 12/31/2021   Procedure: ABDOMINAL AORTOGRAM W/LOWER EXTREMITY;  Surgeon: Nada Libman, MD;  [facility]: [facility] INVASIVE CV LAB;  Service: Cardiovascular;  Laterality: Left;   AMPUTATION Right 07/02/2021   Procedure: RIGHT ABOVE KNEE AMPUTATION;  Surgeon: Sherren Kerns, MD;  Location: [facility] OR;  Service: Vascular;  Laterality: Right;   ANGIOPLASTY Right 06/08/2021   Procedure: BALLOON ANGIOPLASTY;  Surgeon: Nada Libman, MD;  Location: [facility] OR;  Service: Vascular;  Laterality: Right;   APPENDECTOMY     APPLICATION OF WOUND VAC Right 06/27/2021   Procedure: APPLICATION OF WOUND VAC;  Surgeon: Cephus Shelling, MD;  Location: [facility] OR;  Service: Vascular;  Laterality: Right;   COLONOSCOPY WITH  PROPOFOL N/A 10/11/2015   Procedure: COLONOSCOPY WITH PROPOFOL;  Surgeon: Charna Elizabeth, MD;  Location: [facility] ENDOSCOPY;  Service: Endoscopy;  Laterality: N/A;   CORONARY ARTERY BYPASS GRAFT N/A 11/04/2013   Procedure: CORONARY ARTERY BYPASS GRAFTING (CABG) TIMES FOUR  USING LEFT INTERNAL MAMMARY ARTERY AND RIGHT AND LEFT SAPHENOUS LEG VEIN HARVESTED ENDOSCOPICALLY;  Surgeon: Loreli Slot, MD;  Location: [facility] OR;  Service: Open Heart Surgery;  Laterality: N/A;   CRANIOTOMY Left 06/30/2019   Procedure: Frontal CRANIOTOMY HEMATOMA EVACUATION SUBDURAL;  Surgeon: Lisbeth Renshaw, MD;  Location: [facility] OR;  Service: Neurosurgery;  Laterality: Left;  Frontal CRANIOTOMY HEMATOMA EVACUATION SUBDURAL   FEMORAL-POPLITEAL BYPASS GRAFT Right 01/20/2020   Procedure: BYPASS GRAFT FEMORAL-POPLITEAL ARTERY RIGHT LEG USING GORE PROPATEN GRAFT;  Surgeon: Nada Libman, MD;  Location[facility] OR;  Service: Vascular;  Laterality: Right;   FINGER SURGERY  2017   injury   I & D EXTREMITY Right 03/02/2020   Procedure: IRRIGATION AND DEBRIDEMENT EXTREMITY right  lower leg  with Antibiotic beads.;  Surgeon: Chuck Hint, MD;  Location: [facility] OR;  Service: Vascular;  Laterality: Right;   I & D EXTREMITY Right 06/27/2021   Procedure: IRRIGATION AND DEBRIDEMENT OF RIGHT LEG;  Surgeon: Cephus Shelling, MD;  Location[facility] OR;  Service: Vascular;  Laterality: Right;   INSERTION OF ILIAC STENT Right 06/08/2021   Procedure: INSERTION OF RIGHT UPPER LEG STENT;  Surgeon: Nada Libman, MD;  [facility] OR;  Service: Vascular;  Laterality: Right;   KNEE SURGERY     fractured patella   LEFT HEART CATH N/A 10/29/2013   Procedure: LEFT HEART CATH;  Surgeon: Kathleene Hazel, MD;  Location: [facility] CATH LAB;  Service: Cardiovascular;  Laterality: N/A;   LEFT HEART CATHETERIZATION WITH CORONARY ANGIOGRAM N/A 10/31/2013   Procedure: LEFT HEART CATHETERIZATION WITH CORONARY ANGIOGRAM;  Surgeon: Kathleene Hazel, MD;   Location: [facility] CATH LAB;  Service: Cardiovascular;  Laterality: N/A;   LIGATION OF CILIAC ARTERY Right 06/27/2021   Procedure: LIGATION OF POPITEAL ARTERY;  Surgeon: Cephus Shelling, MD;  Location: [facility] OR;  Service: Vascular;  Laterality: Right;   LOWER EXTREMITY ANGIOGRAPHY Right 06/07/2021   Procedure: Lower Extremity Angiography;  Surgeon: Sherren Kerns, MD;  Location: [facility] INVASIVE CV LAB;  Service: Cardiovascular;  Laterality: Right;  W/ ABD    MOUTH SURGERY     PATCH ANGIOPLASTY Right 07/08/2021   Procedure: PATCH ANGIOPLASTY WITH 1X6 Kathleen Lime;  Surgeon: Sherren Kerns, MD;  Location: [facility] OR;  Service: Vascular;  Laterality: Right;   PERIPHERAL VASCULAR THROMBECTOMY Right 06/07/2021   Procedure: PERIPHERAL VASCULAR THROMBECTOMY;  Surgeon: Sherren Kerns, MD;  [facility];  Service: Cardiovascular;  Laterality: Right;   REMOVAL OF GRAFT Right 07/08/2021   Procedure: REMOVAL RIGHT FEMORAL BYPASS GRAFT;  Surgeon: Sherren Kerns, MD;  Location: [facility]  OR;  Service: Vascular;  Laterality: Right;   TOE SURGERY     VEIN REPAIR Right 06/27/2021   Procedure: REPAIR OF POPITEAL VEIN;  Surgeon: Cephus Shellinglark, Christopher J, MD;  [facility];  Service: Vascular;  Laterality: Right;     A IV Location/Drains/Wounds Patient Lines/Drains/Airways Status     Active Line/Drains/Airways     Name Placement date Placement time Site Days   Peripheral IV 12/20/22 20 G Left;Posterior Hand 12/20/22  1654  Hand  less than 1   Incision (Closed) 06/08/21 Groin Left 06/08/21  1208  -- 560   Incision (Closed) 07/08/21 Groin Other (Comment);Right 07/08/21  1824  -- 530   Incision (Closed) 07/11/21 Thigh Anterior;Distal;Right;Upper 07/11/21  2020  -- 527            Intake/Output Last 24 hours No intake or output data in the 24 hours ending 12/20/22 1842  Labs/Imaging Results for orders placed or performed during the hospital encounter of 12/20/22 (from the past 48 hour(s))  CBC with  Differential     Status: Abnormal   Collection Time: 12/20/22  4:53 PM  Result Value Ref Range   WBC 12.3 (H) 4.0 - 10.5 K/uL   RBC 4.80 4.22 - 5.81 MIL/uL   Hemoglobin 12.6 (L) 13.0 - 17.0 g/dL   HCT 16.139.4 09.639.0 - 04.552.0 %   MCV 82.1 80.0 - 100.0 fL   MCH 26.3 26.0 - 34.0 pg   MCHC 32.0 30.0 - 36.0 g/dL   RDW 40.914.6 81.111.5 - 91.415.5 %   Platelets 289 150 - 400 K/uL   nRBC 0.0 0.0 - 0.2 %   Neutrophils Relative % 76 %   Neutro Abs 9.4 (H) 1.7 - 7.7 K/uL   Lymphocytes Relative 14 %   Lymphs Abs 1.7 0.7 - 4.0 K/uL   Monocytes Relative 6 %   Monocytes Absolute 0.8 0.1 - 1.0 K/uL   Eosinophils Relative 2 %   Eosinophils Absolute 0.3 0.0 - 0.5 K/uL   Basophils Relative 1 %   Basophils Absolute 0.1 0.0 - 0.1 K/uL   Immature Granulocytes 1 %   Abs Immature Granulocytes 0.08 (H) 0.00 - 0.07 K/uL    Comment: Performed at [facility], [address] [place], [place] [address]  Comprehensive metabolic panel     Status: Abnormal   Collection Time: 12/20/22  4:53 PM  Result Value Ref Range   Sodium 138 135 - 145 mmol/L   Potassium 3.9 3.5 - 5.1 mmol/L   Chloride 104 98 - 111 mmol/L   CO2 24 22 - 32 mmol/L   Glucose, Bld 163 (H) 70 - 99 mg/dL    Comment: Glucose reference range applies only to samples taken after fasting for at least 8 hours.   BUN 19 8 - 23 mg/dL   Creatinine, Ser 6.211.07 0.61 - 1.24 mg/dL   Calcium 9.4 8.9 - 30.810.3 mg/dL   Total Protein 7.3 6.5 - 8.1 g/dL   Albumin 3.8 3.5 - 5.0 g/dL   AST 23 15 - 41 U/L   ALT 28 0 - 44 U/L   Alkaline Phosphatase 106 38 - 126 U/L   Total Bilirubin 0.8 0.3 - 1.2 mg/dL   GFR, Estimated >65>60 >78>60 mL/min    Comment: (NOTE) Calculated using the CKD-EPI Creatinine Equation (2021)    Anion gap 10 5 - 15    Comment: Performed at [facility], [address] [place], [place] [address]  Magnesium  Status: None   Collection Time: 12/20/22  4:53 PM  Result Value Ref Range   Magnesium 2.0 1.7 - 2.4 mg/dL     Comment: Performed at [facility], [address], [place], [place] [address]   DG Ribs Unilateral W/Chest Right  Result Date: 12/20/2022 CLINICAL DATA:  Trauma, fall, pain EXAM: RIGHT RIBS AND CHEST - 3+ VIEW COMPARISON:  Chest radiograph done on 07/04/2021 FINDINGS: Transverse diameter of heart is slightly increased. There is previous coronary bypass surgery. Small linear density in left lower lung fields suggest scarring or subsegmental atelectasis. There is poor inspiration. Displaced fractures are seen in the posterolateral aspect of right seventh, eighth and ninth ribs. IMPRESSION: Recent displaced fractures are seen in posterolateral aspects of right seventh, eighth and ninth ribs. There is no demonstrable focal pulmonary contusion. There is no pleural effusion or pneumothorax. Linear densities in left lower lung fields suggest scarring or subsegmental atelectasis. Previous coronary bypass surgery. Electronically Signed   By: Ernie Avena M.D.   On: 12/20/2022 16:42    Pending Labs Unresulted Labs (From admission, onward)    None       Vitals/Pain Today's Vitals   12/20/22 1543 12/20/22 1546 12/20/22 1549  BP:   (!) 153/80  Pulse:   66  Resp:   19  Temp: 98.6 F (37 C)    TempSrc: Oral    SpO2:   96%  Weight:  86.2 kg   Height:  6\' 2"  (1.88 m)   PainSc: 6       Isolation Precautions No active isolations  Medications Medications  HYDROcodone-acetaminophen (NORCO/VICODIN) 5-325 MG per tablet 1 tablet (1 tablet Oral Given 12/20/22 1623)    Mobility  High fall risk   Focused Assessments    R Recommendations: See Admitting Provider Note  Report given to:   Additional Notes:

## 2022-12-20 NOTE — ED Provider Notes (Signed)
[facility]-EMERGENCY DEPT Provider Note   CSN: [id number] Arrival date & time: 12/20/22  1525     History  Chief Complaint  Patient presents with   Fall   Rib Injury    Lonnie [name] is a 71 y.o. male.   Fall     71 year old male with medical history significant for Alzheimer's dementia who presents to the emergency department with right-sided rib pain after a fall.  The patient fell 3 days ago in the bathtub.  He did not hit his head and did not lose consciousness.  He landed on his right ribs.  He denies any other injuries or complaints.   Additional hx obtained from the patient's daughter: He did hit his head and shoulder in the fall. No other injuries or complaints.   He arrived to the ED GCS 14, ABC intact.  Home Medications Prior to Admission medications   Medication Sig Start Date End Date Taking? Authorizing Provider  acetaminophen (TYLENOL) 500 MG tablet Take 1,000 mg by mouth in the morning and at bedtime.    [provider]  aspirin EC 81 MG tablet Take 81 mg by mouth in the morning.    [provider]  atorvastatin (LIPITOR) 80 MG tablet TAKE 1 TABLET EVERY DAY 08/11/22   Worthy Rancher B, FNP  Calcium Carb-Cholecalciferol (CALCIUM 600+D3 PO) Take 1 tablet by mouth daily with breakfast.    [provider]  citalopram (CELEXA) 20 MG tablet TAKE 1/2 TABLET TWICE DAILY 08/11/22   Worthy Rancher B, FNP  clonazePAM (KLONOPIN) 0.25 MG disintegrating tablet Take 1 tablet (0.25 mg total) by mouth 2 (two) times daily as needed (anxiety). 04/25/22   [name], MD  clopidogrel (PLAVIX) 75 MG tablet TAKE 1 TABLET EVERY DAY 08/11/22   Worthy Rancher B, FNP  docusate sodium (COLACE) 100 MG capsule Take 100 mg by mouth in the morning.    [provider]  Ferrous Fumarate (HEMOCYTE - 106 MG FE) 324 (106 Fe) MG TABS tablet Take 1 tablet (106 mg of iron total) by mouth daily. 08/22/21   [name], MD  furosemide (LASIX) 20 MG  tablet TAKE 1 TABLET BY MOUTH DAILY AS NEEDED FOR EDEMA 05/29/22   [name], MD  gabapentin (NEURONTIN) 300 MG capsule Take 1 capsule (300 mg total) by mouth 3 (three) times daily. Patient taking differently: Take 100 mg by mouth 3 (three) times daily. 100 mg 3 x daily 04/07/22   Marita Kansas, PA-C  irbesartan (AVAPRO) 300 MG tablet TAKE 1 TABLET EVERY DAY 08/11/22   Worthy Rancher B, FNP  melatonin 3 MG TABS tablet Take 2 mg by mouth at bedtime.    [provider]  Menthol, Topical Analgesic, (PAIN RELIEVING PATCH ULTRA ST EX) Apply 1 patch topically daily as needed (pain). Patient not taking: Reported on 05/05/2022    [provider]  metoprolol tartrate (LOPRESSOR) 25 MG tablet TAKE 1/2 TABLET TWICE DAILY 08/11/22   Worthy Rancher B, FNP  Multiple Vitamin (MULTIVITAMIN WITH MINERALS) TABS tablet Take 1 tablet by mouth in the morning.    [provider]  oxyCODONE 10 MG TABS Take 1 tablet (10 mg total) by mouth every 4 (four) hours as needed for severe pain. 04/13/22   Dione Booze, MD  oxyCODONE-acetaminophen (PERCOCET) 5-325 MG tablet Take 1 tablet by mouth every 4 (four) hours as needed for severe pain. Patient not taking: Reported on 05/05/2022 05/01/22 05/01/23  Elson Areas, PA-C  pantoprazole (PROTONIX) 40 MG tablet TAKE 1 TABLET AT BEDTIME 08/11/22   Worthy Rancher B, FNP  QUEtiapine (SEROQUEL) 200 MG tablet TAKE 1 TABLET TWICE DAILY 08/11/22   Worthy Rancher B, FNP  tamsulosin (FLOMAX) 0.4 MG CAPS capsule Take 1 capsule (0.4 mg total) by mouth daily after supper. 03/17/22   [name], MD      Allergies    Ace inhibitors, Eggs or egg-derived products, Lactose intolerance (gi), and Latex    Review of Systems   Review of Systems  Unable to perform ROS: Dementia    Physical Exam Updated Vital Signs BP 125/75   Pulse (!) 57   Temp 98.6 F (37 C) (Oral)   Resp 18   Ht 6\' 2"  (1.88 m)   Wt 86.2 kg   SpO2 95%   BMI 24.39 kg/m  Physical Exam Vitals and nursing  note reviewed.  Constitutional:      Appearance: He is well-developed.     Comments: GCS 15, ABC intact  HENT:     Head: Normocephalic.  Eyes:     Conjunctiva/sclera: Conjunctivae normal.  Neck:     Comments: No midline tenderness to palpation of the cervical spine. ROM intact. Cardiovascular:     Rate and Rhythm: Normal rate and regular rhythm.  Pulmonary:     Effort: Pulmonary effort is normal. No respiratory distress.     Breath sounds: Normal breath sounds.  Chest:     Comments: Chest wall with right sided tenderness to palpation. Clavicles stable and non-tender to AP compression Abdominal:     Palpations: Abdomen is soft.     Tenderness: There is no abdominal tenderness.     Comments: Pelvis stable to lateral compression.  Musculoskeletal:     Cervical back: Neck supple.     Comments: No midline tenderness to palpation of the thoracic or lumbar spine. Extremities atraumatic with intact ROM.   Skin:    General: Skin is warm and dry.  Neurological:     Mental Status: He is alert.     Comments: CN II-XII grossly intact. Moving all four extremities spontaneously and sensation grossly intact.     ED Results / Procedures / Treatments   Labs (all labs ordered are listed, but only abnormal results are displayed) Labs Reviewed  CBC WITH DIFFERENTIAL/PLATELET - Abnormal; Notable for the following components:      Result Value   WBC 12.3 (*)    Hemoglobin 12.6 (*)    Neutro Abs 9.4 (*)    Abs Immature Granulocytes 0.08 (*)    All other components within normal limits  COMPREHENSIVE METABOLIC PANEL - Abnormal; Notable for the following components:   Glucose, Bld 163 (*)    All other components within normal limits  MAGNESIUM    EKG None  Radiology DG Shoulder Right Portable  Result Date: 12/20/2022 CLINICAL DATA:  fall EXAM: RIGHT SHOULDER - 1 VIEW COMPARISON:  X-ray right shoulder 02/13/2022 FINDINGS: No evidence of fracture or dislocation. No evidence of severe  arthropathy. No aggressive appearing focal bone abnormality. Soft tissues are unremarkable. IMPRESSION: Negative. Electronically Signed   By: 02/15/2022 M.D.   On: 12/20/2022 19:09   CT HEAD WO CONTRAST (12/22/2022)  Result Date: 12/20/2022 CLINICAL DATA:  Head trauma, minor (Age >= 65y) EXAM: CT HEAD WITHOUT CONTRAST TECHNIQUE: Contiguous axial images were obtained from the base of the skull through the vertex without intravenous contrast. RADIATION DOSE REDUCTION: This exam was performed according to the departmental dose-optimization  program which includes automated exposure control, adjustment of the mA and/or kV according to patient size and/or use of iterative reconstruction technique. COMPARISON:  None Available. BRAIN: BRAIN Cerebral ventricle sizes are concordant with the degree of cerebral volume loss. Patchy and confluent areas of decreased attenuation are noted throughout the deep and periventricular white matter of the cerebral hemispheres bilaterally, compatible with chronic microvascular ischemic disease. Left occipital encephalomalacia. No evidence of large-territorial acute infarction. No parenchymal hemorrhage. No mass lesion. Bilateral chronic hygromas. No acute extra-axial collection. No mass effect or midline shift. No hydrocephalus. Basilar cisterns are patent. Empty sella. Vascular: No hyperdense vessel. Skull: No acute fracture or focal lesion.  Prior left craniotomy Sinuses/Orbits: Bilateral maxillary, right ethmoid, bilateral ethmoid sinus mucosal thickening. Otherwise visualized paranasal sinuses and mastoid air cells are clear. The orbits are unremarkable. Other: None. IMPRESSION: 1. No acute intracranial abnormality. 2. Empty sella. Findings is often a normal anatomic variant but can be associated with idiopathic intracranial hypertension (pseudotumor cerebri). Electronically Signed   By: Tish Frederickson M.D.   On: 12/20/2022 19:09   CT Cervical Spine Wo Contrast  Result Date:  12/20/2022 CLINICAL DATA:  Trauma, fell 3 days ago EXAM: CT CERVICAL SPINE WITHOUT CONTRAST TECHNIQUE: Multidetector CT imaging of the cervical spine was performed without intravenous contrast. Multiplanar CT image reconstructions were also generated. RADIATION DOSE REDUCTION: This exam was performed according to the departmental dose-optimization program which includes automated exposure control, adjustment of the mA and/or kV according to patient size and/or use of iterative reconstruction technique. COMPARISON:  None Available. FINDINGS: Alignment: Reversal of cervical lordosis. Facet alignment within normal limits. Trace retrolisthesis C4 on C5. Skull base and vertebrae: No acute fracture. No primary bone lesion or focal pathologic process. Soft tissues and spinal canal: No prevertebral fluid or swelling. No visible canal hematoma. Disc levels: Ankylosis/fusion at C3-C4. Severe disc space narrowing and degenerative change C4-C5, C5-C6 and C6-C7. Facet degenerative changes at multiple levels with foraminal narrowing Upper chest: Emphysema and apical scarring Other: None IMPRESSION: 1. Reversal of cervical lordosis with advanced degenerative changes. No acute osseous abnormality. 2. Emphysema. Emphysema (ICD10-J43.9). Electronically Signed   By: Jasmine Pang M.D.   On: 12/20/2022 19:07   DG Ribs Unilateral W/Chest Right  Result Date: 12/20/2022 CLINICAL DATA:  Trauma, fall, pain EXAM: RIGHT RIBS AND CHEST - 3+ VIEW COMPARISON:  Chest radiograph done on 07/04/2021 FINDINGS: Transverse diameter of heart is slightly increased. There is previous coronary bypass surgery. Small linear density in left lower lung fields suggest scarring or subsegmental atelectasis. There is poor inspiration. Displaced fractures are seen in the posterolateral aspect of right seventh, eighth and ninth ribs. IMPRESSION: Recent displaced fractures are seen in posterolateral aspects of right seventh, eighth and ninth ribs. There is no  demonstrable focal pulmonary contusion. There is no pleural effusion or pneumothorax. Linear densities in left lower lung fields suggest scarring or subsegmental atelectasis. Previous coronary bypass surgery. Electronically Signed   By: Ernie Avena M.D.   On: 12/20/2022 16:42    Procedures Procedures    Medications Ordered in ED Medications  HYDROmorphone (DILAUDID) injection 0.5 mg (0.5 mg Intravenous Given 12/20/22 1932)  HYDROcodone-acetaminophen (NORCO/VICODIN) 5-325 MG per tablet 1 tablet (1 tablet Oral Given 12/20/22 1623)    ED Course/ Medical Decision Making/ A&P                           Medical Decision Making Amount and/or Complexity of Data  Reviewed Labs: ordered. Radiology: ordered.  Risk Prescription drug management. Decision regarding hospitalization.    71 year old male with medical history significant for Alzheimer's dementia who presents to the emergency department with right-sided rib pain after a fall.  The patient fell 3 days ago in the bathtub.  He did not hit his head and did not lose consciousness.  He landed on his right ribs.  He denies any other injuries or complaints.  On arrival, the patient was vitally stable, afebrile, sinus rhythm noted on cardiac telemetry.  Physical exam concerning for right-sided chest wall tenderness to palpation concerning for possible rib fracture in the setting of the patient's fall 3 days ago.  CT imaging of the head and cervical spine was performed and was negative for acute fracture or malalignment.  X-ray imaging of the right shoulder was performed and was also negative.  Chest x-ray with ribs revealed the following: IMPRESSION:  Recent displaced fractures are seen in posterolateral aspects of  right seventh, eighth and ninth ribs. There is no demonstrable focal  pulmonary contusion. There is no pleural effusion or pneumothorax.  Linear densities in left lower lung fields suggest scarring or  subsegmental  atelectasis. Previous coronary bypass surgery.   Laboratory evaluation significant for nonspecific leukocytosis 12.3, mild anemia to 12.6, CMP generally unremarkable with the exception of mild hyperglycemia to 163, magnesium normal at 2.0.  The patient was administered 1 Norco for pain control.  Given the patient's 3 rib fractures of the right seventh, eighth and ninth ribs, I did consult trauma surgery and spoke with Dr. Sheliah HatchKinsinger who plans to admit the patient to his service for pain control in the setting of 3 rib fractures.  The patient was subsequently admitted in stable condition.  Family were updated via the phone and bedside.  Final Clinical Impression(s) / ED Diagnoses Final diagnoses:  Fall, initial encounter  Closed fracture of multiple ribs of right side, initial encounter    Rx / DC Orders ED Discharge Orders     None         Ernie AvenaLawsing, Anjolina Byrer, MD 12/20/22 2148

## 2022-12-20 NOTE — ED Triage Notes (Signed)
The patient fell 3 days. He was seen by [facility] and x rays were taken, though results have not come in yet. Today the patient's family wanted him seen due to the right rib pain.   HX: Dementia  EMS vitals: 160/100 BP 74 HR

## 2022-12-20 NOTE — ED Notes (Signed)
Attempted to get IV access, unsuccessful.

## 2022-12-20 NOTE — ED Notes (Signed)
Notified the Wife that the patient is in route to [facility].

## 2022-12-21 LAB — HIV ANTIBODY (ROUTINE TESTING W REFLEX): HIV Screen 4th Generation wRfx: NONREACTIVE

## 2022-12-21 MED ORDER — METHOCARBAMOL 750 MG PO TABS: 750.0000 mg | ORAL_TABLET | Freq: Three times a day (TID) | ORAL | 0 refills | Status: DC | PRN

## 2022-12-21 MED ORDER — OXYCODONE HCL 5 MG PO TABS: 5.0000 mg | ORAL_TABLET | Freq: Four times a day (QID) | ORAL | 0 refills | Status: DC | PRN

## 2022-12-21 NOTE — Evaluation (Signed)
Physical Therapy Evaluation Patient Details Name: Lonnie [name] MRN: [id number] DOB: 1950-12-23 Today's Date: 12/21/2022  History of Present Illness  71 yo male fell and lost his balance 3 days ago. He had worsening rib pain and went to the ED where he was found to have 3 rib fractures on the Right;  has a pertinent past medical history of L AKA, CAD (coronary artery disease), Chronic combined systolic and diastolic CHF (congestive heart failure) ([place]), Dementia ([place]), Depression, Falls, Hypertension, Ischemic cardiomyopathy, MI (myocardial infarction) ([place]), Peripheral neuropathy, Prediabetes (09/01/2014), PVD (peripheral vascular disease) ([place]), Stroke ([place]), Subdural hematoma ([place]), Tobacco abuse, and UTI (urinary tract infection).  Clinical Impression   Pt admitted with above diagnosis. Lives at home with wife (and assist from [place]), in a single-level home with no steps to enter; Prior to admission, pt was able to manage at wheelchair transfer level, needing assist occasionally for transfers and ADLs; Presents to PT with significant R rib pain with movement, effecting activity tolerance and transfers;  Min/light Mod assist to sit up to EOB and Mod assist to perform lateral scoot transfer bed to recliner on pt's L side; Pt currently with functional limitations due to the deficits listed below (see PT Problem List). Pt will benefit from skilled PT to increase their independence and safety with mobility to allow discharge to the venue listed below.          Recommendations for follow up therapy are one component of a multi-disciplinary discharge planning process, led by the attending physician.  Recommendations may be updated based on patient status, additional functional criteria and insurance authorization.  Follow Up Recommendations Skilled nursing-short term rehab (<3 hours/day) Can patient physically be transported by private vehicle: No    Assistance Recommended at  Discharge Frequent or constant Supervision/Assistance  Patient can return home with the following  A lot of help with walking and/or transfers;A lot of help with bathing/dressing/bathroom    Equipment Recommendations Other (comment) (Pretty well-equipped; consider sliding board)  Recommendations for Other Services       Functional Status Assessment Patient has had a recent decline in their functional status and demonstrates the ability to make significant improvements in function in a reasonable and predictable amount of time.     Precautions / Restrictions Precautions Precautions: Fall Required Braces or Orthoses: Other Brace (R prosthesis, useful for transfers) Restrictions Weight Bearing Restrictions: No      Mobility  Bed Mobility Overal bed mobility: Needs Assistance Bed Mobility: Supine to Sit     Supine to sit: Mod assist, Min assist     General bed mobility comments: initiated getting up on L side, with R arm reaching across fo rbedrail, but too painful; pt able to pull himself to long sit using bedrails bilaterally, and then turned body to L side to sit EOB    Transfers Overall transfer level: Needs assistance Equipment used: None Transfers: Bed to chair/wheelchair/BSC            Lateral/Scoot Transfers: Mod assist General transfer comment: Mod assist and use of bed pad to help pt perform lateral scoot transfer, bed to relciner with armrest down; transferred towards L side    Ambulation/Gait                  Stairs            Wheelchair Mobility    Modified Rankin (Stroke Patients Only)       Balance  Pertinent Vitals/Pain      Home Living Family/patient expects to be discharged to:: Private residence Living Arrangements: Spouse/significant other Available Help at Discharge: Family;Personal care attendant;Available PRN/intermittently Type of Home: House Home Access:  Level entry       Home Layout: One level Home Equipment: Conservation officer, nature (2 wheels);Shower seat;Hand held shower head;Grab bars - tub/shower;Wheelchair - manual Additional Comments: Personal care attendant assists a few times a week    Prior Function Prior Level of Function : Needs assist             Mobility Comments: Typically mobile at wheelchair transfer level ADLs Comments: PCA assists with bathing, other ADLs     Hand Dominance   Dominant Hand: Right    Extremity/Trunk Assessment   Upper Extremity Assessment Upper Extremity Assessment: Overall WFL for tasks assessed;RUE deficits/detail RUE Deficits / Details: Strong grimace with RUE movement; able to use RUE well to pillow splint pt's ribs RUE: Unable to fully assess due to pain (rib pain with movement)    Lower Extremity Assessment Lower Extremity Assessment: Generalized weakness;RLE deficits/detail RLE Deficits / Details: R AKA       Communication   Communication: No difficulties  Cognition Arousal/Alertness: Awake/alert Behavior During Therapy: WFL for tasks assessed/performed Overall Cognitive Status: No family/caregiver present to determine baseline cognitive functioning                                 General Comments: Noted dementia in his PMH        General Comments General comments (skin integrity, edema, etc.): Session conducted on room air and O2 sats remained greater tahn or equal to 97%    Exercises Other Exercises Other Exercises: Taught pt pillow splinting with deep breathing   Assessment/Plan    PT Assessment Patient needs continued PT services  PT Problem List Decreased strength;Decreased range of motion;Decreased activity tolerance;Decreased balance;Decreased mobility;Decreased coordination;Decreased cognition;Decreased knowledge of use of DME;Decreased safety awareness;Decreased knowledge of precautions;Pain       PT Treatment Interventions DME instruction;Functional  mobility training;Therapeutic activities;Therapeutic exercise;Balance training;Neuromuscular re-education;Cognitive remediation;Patient/family education;Wheelchair mobility training    PT Goals (Current goals can be found in the Care Plan section)  Acute Rehab PT Goals Patient Stated Goal: less pain; to move well enough to go home PT Goal Formulation: With patient Time For Goal Achievement: 01/04/23 Potential to Achieve Goals: Good Additional Goals Additional Goal #1: Pt wil demonstrate pillow splinting for better tolerance of deep breathing while using incentive spirometer Additional Goal #2: Pt will propel wheelchair, including turns, with Supervision and cues for greater than 300 ft    Frequency Min 2X/week     Co-evaluation               AM-PAC PT "6 Clicks" Mobility  Outcome Measure Help needed turning from your back to your side while in a flat bed without using bedrails?: A Lot Help needed moving from lying on your back to sitting on the side of a flat bed without using bedrails?: A Little Help needed moving to and from a bed to a chair (including a wheelchair)?: A Lot Help needed standing up from a chair using your arms (e.g., wheelchair or bedside chair)?: Total Help needed to walk in hospital room?: Total Help needed climbing 3-5 steps with a railing? : Total 6 Click Score: 10    End of Session Equipment Utilized During Treatment:  (bed pads) Activity Tolerance: Patient  tolerated treatment well Patient left: in chair;with call bell/phone within reach;with chair alarm set Nurse Communication: Mobility status PT Visit Diagnosis: Other abnormalities of gait and mobility (R26.89);Pain Pain - Right/Left: Right Pain - part of body:  (ribs and trunk)    Time: 1540-0867 PT Time Calculation (min) (ACUTE ONLY): 41 min   Charges:   PT Evaluation $PT Eval Moderate Complexity: 1 Mod PT Treatments $Therapeutic Activity: 23-37 mins        Van Clines, PT  Acute  Rehabilitation Services Office [phone]   Levi Aland 12/21/2022, 12:44 PM

## 2022-12-21 NOTE — TOC Progression Note (Signed)
Transition of Care [facility]) - Progression Note    Patient Details  Name: Lonnie [name] MRN: [id number] Date of Birth: 08-06-1951  [facility] CM/SW Contact  Bess Kinds, RN Phone Number: [phone] 12/21/2022, 4:05 PM  Clinical Narrative:     Spoke with wife at the bedside to discuss post acute transition. Advised that patient does currently have a DC order. Wife stated that she cannot care for patient at home in his weakened condition and is in agreement for SNF. Patient will need bed offers when available and insurance auth. TOC following.   Expected Discharge Plan: Skilled Nursing Facility Barriers to Discharge: Continued Medical Work up  Expected Discharge Plan and Services In-house Referral: Clinical Social Work Discharge Planning Services: CM Consult Post Acute Care Choice: Skilled Nursing Facility Living arrangements for the past 2 months: Single Family Home Expected Discharge Date: 12/21/22               DME Arranged: N/A DME Agency: NA                   Social Determinants of Health (SDOH) Interventions SDOH Screenings   Food Insecurity: No Food Insecurity (12/20/2022)  Housing: Low Risk  (12/20/2022)  Transportation Needs: No Transportation Needs (12/20/2022)  Utilities: Not At Risk (12/20/2022)  Alcohol Screen: Low Risk  (05/09/2021)  Depression (PHQ2-9): Low Risk  (02/13/2022)  Financial Resource Strain: Low Risk  (05/09/2021)  Physical Activity: Inactive (05/09/2021)  Social Connections: Moderately Isolated (05/09/2021)  Stress: No Stress Concern Present (05/09/2021)  Tobacco Use: Medium Risk (12/20/2022)    Readmission Risk Interventions    06/14/2021    3:03 PM 09/12/2020   11:41 AM  Readmission Risk Prevention Plan  Post Dischage Appt Complete   Medication Screening Complete   Transportation Screening Complete Complete  PCP or Specialist Appt within 3-5 Days  Complete  HRI or Home Care Consult  Complete  Social Work Consult for  Recovery Care Planning/Counseling  Complete  Palliative Care Screening  Not Applicable  Medication Review Oceanographer)  Complete

## 2022-12-21 NOTE — Progress Notes (Signed)
Mobility Specialist Progress Note   12/21/22 1413  Mobility  Activity Transferred from chair to bed  Level of Assistance Moderate assist, patient does 50-74%  Assistive Device Other (Comment) (HHA)  Activity Response Tolerated well  $Mobility charge 1 Mobility   Received pt in chair requesting to get back to bed. Lateral scoot on L side back to bed requiring modA w/ the use of the pad. Pt limited by R rib pain but able to tolerate transfer, mod cues needed for foot and hand placement throughout. Left in bed w/ call bell in reach and bed alarm on.  Frederico Hamman Mobility Specialist Please contact via SecureChat or  Rehab office at [phone]

## 2022-12-21 NOTE — H&P (Signed)
Reason for Consult:rib fractures Referring Provider: Gerarda FractionJames Laswing  Lynetta MareLarry [name] is an 71 y.o. male.  HPI: 71 yo male fell and lost his balance 3 days ago. He had worsening rib pain and went to the ED where he was found to have 3 rib fractures. Pain is worse with deep inspiration. It is not severe and the mediccations help.  Past Medical History:  Diagnosis Date   Allergy    Anxiety    Arthritis    left neck, shoulder, knee   CAD (coronary artery disease)    a. anterior STEMI 10/2013 s/p 4V CABG with LIMA to mid LAD, SVG to OM, SVG to PDA, SVG to Diagonal.   Chronic combined systolic and diastolic CHF (congestive heart failure) (HCC)    Dementia (HCC)    Depression    Falls    Hypertension    Ischemic cardiomyopathy    a. EF 40-45% at time of CABG and in 2018.   MI (myocardial infarction) (HCC)    Peripheral neuropathy    Prediabetes 09/01/2014   PVD (peripheral vascular disease) (HCC)    a. s/p L SFA stents with now known bilateral SFA. b. right femoral to below the knee bypass by Dr. Myra GianottiBrabham in 12/2019, c/b infection 02/2020.   Stroke [facility](HCC)    seen on CT Scan   Subdural hematoma (HCC)    Tobacco abuse    UTI (urinary tract infection)     Past Surgical History:  Procedure Laterality Date   ABDOMINAL AORTAGRAM N/A 06/07/2014   Procedure: ABDOMINAL Ronny FlurryAORTAGRAM;  Surgeon: Iran OuchMuhammad A Arida, MD;  [facility]: [facility] CATH LAB;  Service: Cardiovascular;  Laterality: N/A;   ABDOMINAL AORTAGRAM N/A 03/07/2015   Procedure: ABDOMINAL Ronny FlurryAORTAGRAM;  Surgeon: Iran OuchMuhammad A Arida, MD;  Location: [facility] CATH LAB;  Service: Cardiovascular;  Laterality: N/A;   ABDOMINAL AORTOGRAM W/LOWER EXTREMITY N/A 01/18/2020   Procedure: ABDOMINAL AORTOGRAM W/LOWER EXTREMITY - Right;  Surgeon: Iran OuchArida, Muhammad A, MD;  [facility]: [facility] INVASIVE CV LAB;  Service: Cardiovascular;  Laterality: N/A;   ABDOMINAL AORTOGRAM W/LOWER EXTREMITY Left 12/31/2021   Procedure: ABDOMINAL AORTOGRAM W/LOWER EXTREMITY;  Surgeon: Nada LibmanBrabham,  Vance W, MD;  [facility]: [facility] INVASIVE CV LAB;  Service: Cardiovascular;  Laterality: Left;   AMPUTATION Right 07/02/2021   Procedure: RIGHT ABOVE KNEE AMPUTATION;  Surgeon: Sherren KernsFields, Charles E, MD;  [facility];  Service: Vascular;  Laterality: Right;   ANGIOPLASTY Right 06/08/2021   Procedure: BALLOON ANGIOPLASTY;  Surgeon: Nada LibmanBrabham, Vance W, MD;  [facility];  Service: Vascular;  Laterality: Right;   APPENDECTOMY     APPLICATION OF WOUND VAC Right 06/27/2021   Procedure: APPLICATION OF WOUND VAC;  Surgeon: Cephus Shellinglark, Christopher J, MD;  [facility]: [facility] OR;  Service: Vascular;  Laterality: Right;   COLONOSCOPY WITH PROPOFOL N/A 10/11/2015   Procedure: COLONOSCOPY WITH PROPOFOL;  Surgeon: Charna ElizabethJyothi Mann, MD;  Location: [facility] ENDOSCOPY;  Service: Endoscopy;  Laterality: N/A;   CORONARY ARTERY BYPASS GRAFT N/A 11/04/2013   Procedure: CORONARY ARTERY BYPASS GRAFTING (CABG) TIMES FOUR  USING LEFT INTERNAL MAMMARY ARTERY AND RIGHT AND LEFT SAPHENOUS LEG VEIN HARVESTED ENDOSCOPICALLY;  Surgeon: Loreli SlotSteven C Hendrickson, MD;  [facility];  Service: Open Heart Surgery;  Laterality: N/A;   CRANIOTOMY Left 06/30/2019   Procedure: Frontal CRANIOTOMY HEMATOMA EVACUATION SUBDURAL;  Surgeon: Lisbeth RenshawNundkumar, Neelesh, MD;  [facility]: [facility] OR;  Service: Neurosurgery;  Laterality: Left;  Frontal CRANIOTOMY HEMATOMA EVACUATION SUBDURAL   FEMORAL-POPLITEAL BYPASS GRAFT Right 01/20/2020   Procedure: BYPASS GRAFT FEMORAL-POPLITEAL ARTERY RIGHT  LEG USING GORE PROPATEN GRAFT;  Surgeon: Nada Libman, MD;  Location[facility] OR;  Service: Vascular;  Laterality: Right;   FINGER SURGERY  2017   injury   I & D EXTREMITY Right 03/02/2020   Procedure: IRRIGATION AND DEBRIDEMENT EXTREMITY right  lower leg  with Antibiotic beads.;  Surgeon: Chuck Hint, MD;  Location[facility] OR;  Service: Vascular;  Laterality: Right;   I & D EXTREMITY Right 06/27/2021   Procedure: IRRIGATION AND DEBRIDEMENT OF RIGHT LEG;  Surgeon: Cephus Shelling, MD;   [facility];  Service: Vascular;  Laterality: Right;   INSERTION OF ILIAC STENT Right 06/08/2021   Procedure: INSERTION OF RIGHT UPPER LEG STENT;  Surgeon: Nada Libman, MD;  [facility] OR;  Service: Vascular;  Laterality: Right;   KNEE SURGERY     fractured patella   LEFT HEART CATH N/A 10/29/2013   Procedure: LEFT HEART CATH;  Surgeon: Kathleene Hazel, MD;  Location: [facility] CATH LAB;  Service: Cardiovascular;  Laterality: N/A;   LEFT HEART CATHETERIZATION WITH CORONARY ANGIOGRAM N/A 10/31/2013   Procedure: LEFT HEART CATHETERIZATION WITH CORONARY ANGIOGRAM;  Surgeon: Kathleene Hazel, MD;  Location: [facility] CATH LAB;  Service: Cardiovascular;  Laterality: N/A;   LIGATION OF CILIAC ARTERY Right 06/27/2021   Procedure: LIGATION OF POPITEAL ARTERY;  Surgeon: Cephus Shelling, MD;  Location: [facility] OR;  Service: Vascular;  Laterality: Right;   LOWER EXTREMITY ANGIOGRAPHY Right 06/07/2021   Procedure: Lower Extremity Angiography;  Surgeon: Sherren Kerns, MD;  Location: [facility] INVASIVE CV LAB;  Service: Cardiovascular;  Laterality: Right;  W/ ABD    MOUTH SURGERY     PATCH ANGIOPLASTY Right 07/08/2021   Procedure: PATCH ANGIOPLASTY WITH 1X6 Kathleen Lime;  Surgeon: Sherren Kerns, MD;  Location: [facility] OR;  Service: Vascular;  Laterality: Right;   PERIPHERAL VASCULAR THROMBECTOMY Right 06/07/2021   Procedure: PERIPHERAL VASCULAR THROMBECTOMY;  Surgeon: Sherren Kerns, MD;  [facility];  Service: Cardiovascular;  Laterality: Right;   REMOVAL OF GRAFT Right 07/08/2021   Procedure: REMOVAL RIGHT FEMORAL BYPASS GRAFT;  Surgeon: Sherren Kerns, MD;  Location: [facility] OR;  Service: Vascular;  Laterality: Right;   TOE SURGERY     VEIN REPAIR Right 06/27/2021   Procedure: REPAIR OF POPITEAL VEIN;  Surgeon: Cephus Shelling, MD;  [facility];  Service: Vascular;  Laterality: Right;    Family History  Problem Relation Age of Onset   CAD Father 86   AAA (abdominal aortic  aneurysm) Father    Alcohol abuse Father    Hypertension Father    Diabetes Father    Heart attack Father    Stroke Mother    Arthritis Mother    Heart disease Mother    Hypertension Mother    Heart attack Mother    Cardiomyopathy Daughter    Colon cancer Neg Hx    Prostate cancer Neg Hx     Social History:  reports that he quit smoking about 19 months ago. His smoking use included cigarettes. He has a 7.50 pack-year smoking history. He quit smokeless tobacco use about 19 months ago. He reports that he does not drink alcohol and does not use drugs.  Allergies:  Allergies  Allergen Reactions   Ace Inhibitors Swelling and Other (See Comments)    Angioedema   Eggs Or Egg-Derived Products Nausea Only and Other (See Comments)    Cannot eat prepared eggs- Occasional nausea   Lactose Intolerance (Gi)  Diarrhea, Nausea Only and Other (See Comments)    Flatulence, also   Latex Itching    Medications: I have reviewed the patient's current medications.  Results for orders placed or performed during the hospital encounter of 12/20/22 (from the past 48 hour(s))  CBC with Differential     Status: Abnormal   Collection Time: 12/20/22  4:53 PM  Result Value Ref Range   WBC 12.3 (H) 4.0 - 10.5 K/uL   RBC 4.80 4.22 - 5.81 MIL/uL   Hemoglobin 12.6 (L) 13.0 - 17.0 g/dL   HCT 96.0 45.4 - 09.8 %   MCV 82.1 80.0 - 100.0 fL   MCH 26.3 26.0 - 34.0 pg   MCHC 32.0 30.0 - 36.0 g/dL   RDW 11.9 14.7 - 82.9 %   Platelets 289 150 - 400 K/uL   nRBC 0.0 0.0 - 0.2 %   Neutrophils Relative % 76 %   Neutro Abs 9.4 (H) 1.7 - 7.7 K/uL   Lymphocytes Relative 14 %   Lymphs Abs 1.7 0.7 - 4.0 K/uL   Monocytes Relative 6 %   Monocytes Absolute 0.8 0.1 - 1.0 K/uL   Eosinophils Relative 2 %   Eosinophils Absolute 0.3 0.0 - 0.5 K/uL   Basophils Relative 1 %   Basophils Absolute 0.1 0.0 - 0.1 K/uL   Immature Granulocytes 1 %   Abs Immature Granulocytes 0.08 (H) 0.00 - 0.07 K/uL    Comment: Performed at  [facility], [address], [place], [place] [address]  Comprehensive metabolic panel     Status: Abnormal   Collection Time: 12/20/22  4:53 PM  Result Value Ref Range   Sodium 138 135 - 145 mmol/L   Potassium 3.9 3.5 - 5.1 mmol/L   Chloride 104 98 - 111 mmol/L   CO2 24 22 - 32 mmol/L   Glucose, Bld 163 (H) 70 - 99 mg/dL    Comment: Glucose reference range applies only to samples taken after fasting for at least 8 hours.   BUN 19 8 - 23 mg/dL   Creatinine, Ser 0.86 0.61 - 1.24 mg/dL   Calcium 9.4 8.9 - 57.8 mg/dL   Total Protein 7.3 6.5 - 8.1 g/dL   Albumin 3.8 3.5 - 5.0 g/dL   AST 23 15 - 41 U/L   ALT 28 0 - 44 U/L   Alkaline Phosphatase 106 38 - 126 U/L   Total Bilirubin 0.8 0.3 - 1.2 mg/dL   GFR, Estimated >46 >96 mL/min    Comment: (NOTE) Calculated using the CKD-EPI Creatinine Equation (2021)    Anion gap 10 5 - 15    Comment: Performed at [facility], [address], [place], [place] [address]  Magnesium     Status: None   Collection Time: 12/20/22  4:53 PM  Result Value Ref Range   Magnesium 2.0 1.7 - 2.4 mg/dL    Comment: Performed at [facility], [address] [place], [place] [address]  CBC     Status: Abnormal   Collection Time: 12/20/22 11:27 PM  Result Value Ref Range   WBC 12.6 (H) 4.0 - 10.5 K/uL   RBC 4.90 4.22 - 5.81 MIL/uL   Hemoglobin 13.0 13.0 - 17.0 g/dL   HCT 40.1 02.7 - 25.3 %   MCV 81.8 80.0 - 100.0 fL   MCH 26.5 26.0 - 34.0 pg   MCHC 32.4 30.0 - 36.0 g/dL   RDW 66.4 40.3 - 47.4 %  Platelets 295 150 - 400 K/uL   nRBC 0.0 0.0 - 0.2 %    Comment: Performed at [facility], [address], [place], [place] [address]  Creatinine, serum     Status: None   Collection Time: 12/20/22 11:27 PM  Result Value Ref Range   Creatinine, Ser 1.15 0.61 - 1.24 mg/dL   GFR, Estimated >78 >29 mL/min    Comment: (NOTE) Calculated using the CKD-EPI Creatinine Equation (2021) Performed at [facility], [address], [place], [place] [address]     DG Shoulder Right Portable  Result Date: 12/20/2022 CLINICAL DATA:  fall EXAM: RIGHT SHOULDER - 1 VIEW COMPARISON:  X-ray right shoulder 02/13/2022 FINDINGS: No evidence of fracture or dislocation. No evidence of severe arthropathy. No aggressive appearing focal bone abnormality. Soft tissues are unremarkable. IMPRESSION: Negative. Electronically Signed   By: Tish Frederickson M.D.   On: 12/20/2022 19:09   CT HEAD WO CONTRAST ( )  Result Date: 12/20/2022 CLINICAL DATA:  Head trauma, minor (Age >= 65y) EXAM: CT HEAD WITHOUT CONTRAST TECHNIQUE: Contiguous axial images were obtained from the base of the skull through the vertex without intravenous contrast. RADIATION DOSE REDUCTION: This exam was performed according to the departmental dose-optimization program which includes automated exposure control, adjustment of the mA and/or kV according to patient size and/or use of iterative reconstruction technique. COMPARISON:  None Available. BRAIN: BRAIN Cerebral ventricle sizes are concordant with the degree of cerebral volume loss. Patchy and confluent areas of decreased attenuation are noted throughout the deep and periventricular white matter of the cerebral hemispheres bilaterally, compatible with chronic microvascular ischemic disease. Left occipital encephalomalacia. No evidence of large-territorial acute infarction. No parenchymal hemorrhage. No mass lesion. Bilateral chronic hygromas. No acute extra-axial collection. No mass effect or midline shift. No hydrocephalus. Basilar cisterns are patent. Empty sella. Vascular: No hyperdense vessel. Skull: No acute fracture or focal lesion.  Prior left craniotomy Sinuses/Orbits: Bilateral maxillary, right ethmoid, bilateral ethmoid sinus mucosal thickening. Otherwise visualized paranasal sinuses and mastoid air cells are clear. The orbits are unremarkable. Other: None. IMPRESSION: 1. No acute  intracranial abnormality. 2. Empty sella. Findings is often a normal anatomic variant but can be associated with idiopathic intracranial hypertension (pseudotumor cerebri). Electronically Signed   By: Tish Frederickson M.D.   On: 12/20/2022 19:09   CT Cervical Spine Wo Contrast  Result Date: 12/20/2022 CLINICAL DATA:  Trauma, fell 3 days ago EXAM: CT CERVICAL SPINE WITHOUT CONTRAST TECHNIQUE: Multidetector CT imaging of the cervical spine was performed without intravenous contrast. Multiplanar CT image reconstructions were also generated. RADIATION DOSE REDUCTION: This exam was performed according to the departmental dose-optimization program which includes automated exposure control, adjustment of the mA and/or kV according to patient size and/or use of iterative reconstruction technique. COMPARISON:  None Available. FINDINGS: Alignment: Reversal of cervical lordosis. Facet alignment within normal limits. Trace retrolisthesis C4 on C5. Skull base and vertebrae: No acute fracture. No primary bone lesion or focal pathologic process. Soft tissues and spinal canal: No prevertebral fluid or swelling. No visible canal hematoma. Disc levels: Ankylosis/fusion at C3-C4. Severe disc space narrowing and degenerative change C4-C5, C5-C6 and C6-C7. Facet degenerative changes at multiple levels with foraminal narrowing Upper chest: Emphysema and apical scarring Other: None IMPRESSION: 1. Reversal of cervical lordosis with advanced degenerative changes. No acute osseous abnormality. 2. Emphysema. Emphysema (ICD10-J43.9). Electronically Signed   By: Jasmine Pang M.D.   On: 12/20/2022 19:07   DG Ribs Unilateral W/Chest Right  Result Date: 12/20/2022 CLINICAL DATA:  Trauma, fall, pain EXAM: RIGHT RIBS AND CHEST - 3+ VIEW COMPARISON:  Chest radiograph done on 07/04/2021 FINDINGS: Transverse diameter of heart is slightly increased. There is previous coronary bypass surgery. Small linear density in left lower lung fields  suggest scarring or subsegmental atelectasis. There is poor inspiration. Displaced fractures are seen in the posterolateral aspect of right seventh, eighth and ninth ribs. IMPRESSION: Recent displaced fractures are seen in posterolateral aspects of right seventh, eighth and ninth ribs. There is no demonstrable focal pulmonary contusion. There is no pleural effusion or pneumothorax. Linear densities in left lower lung fields suggest scarring or subsegmental atelectasis. Previous coronary bypass surgery. Electronically Signed   By: Ernie Avena M.D.   On: 12/20/2022 16:42    Review of Systems  Constitutional: Negative.   HENT: Negative.    Eyes: Negative.   Respiratory: Negative.    Cardiovascular:  Positive for chest pain.  Gastrointestinal: Negative.   Genitourinary: Negative.   Musculoskeletal: Negative.   Skin: Negative.   Neurological: Negative.   Endo/Heme/Allergies: Negative.   Psychiatric/Behavioral: Negative.      PE Blood pressure 123/69, pulse (!) 58, temperature 98 F (36.7 C), temperature source Oral, resp. rate 18, height 6\' 2"  (1.88 m), weight 92 kg, SpO2 97 %. Constitutional: NAD; conversant; no deformities Eyes: Moist conjunctiva; no lid lag; anicteric; PERRL Neck: Trachea midline; no thyromegaly Lungs: Normal respiratory effort; no tactile fremitus CV: RRR; no palpable thrills; no pitting edema GI: Abd soft, NT; no palpable hepatosplenomegaly MSK: R BKA; no clubbing/cyanosis Psychiatric: Appropriate affect; alert and oriented x3 Lymphatic: No palpable cervical or axillary lymphadenopathy Skin: No major subcutaneous nodules. Warm and dry   Assessment/Plan: 71 yo male with fall 3 days ago Rib fx 7-9 - pain control, pulm toilet Observe on med surg floor  I reviewed last 24 h vitals and pain scores, last 48 h intake and output, last 24 h labs and trends, and last 24 h imaging results.  This care required high  level of medical decision making.   78  Bryant Lipps 12/21/2022, 12:08 AM

## 2022-12-21 NOTE — Discharge Summary (Signed)
Physician Discharge Summary  Patient ID: Lonnie [name] MRN: [id number] DOB/AGE: 1951-01-25 71 y.o.  Admit date: 12/20/2022 Discharge date: 12/21/2022  Admission Diagnoses: Rib fracture right 7, 8, 9  Discharge Diagnoses:  Principal Problem:   Rib fractures   Discharged Condition: good  Hospital Course: Patient admitted after fall 2 days ago.  Was noted to have right-sided rib fractures and admitted for pain control and pulmonary toilet.  He did well overnight.  He required oxygen.  His pain was well-controlled on oral agents he was discharged home postop day 1.  Consults: None     Treatments: IV hydration  Discharge Exam: Blood pressure 98/78, pulse 74, temperature 98.4 F (36.9 C), temperature source Oral, resp. rate 16, height 6\' 2"  (1.88 m), weight 92 kg, SpO2 93 %. General appearance: alert and cooperative Resp: clear to auscultation bilaterally Cardio: Normal sinus rhythm Extremities: extremities normal, atraumatic, no cyanosis or edema Neurologic: Grossly normal  Disposition:      Follow-up Information     Schmerge, , NP. Schedule an appointment as soon as possible for a visit in 1 week(s).   Specialty: Nurse Practitioner Contact information: [address] [place] [phone]                 Signed: [phone] Santiana Glidden MD 12/21/2022, 9:04 AM  Time 30 minutes

## 2022-12-21 NOTE — Care Management CC44 (Signed)
Condition Code 44 Documentation Completed  Patient Details  Name: Lonnie [name] MRN: [id number] Date of Birth: 1951/07/31   Condition Code 44 given:  Yes Patient signature on Condition Code 44 notice:  Yes Documentation of 2 MD's agreement:  Yes Code 44 added to claim:  Yes    Bess Kinds, RN 12/21/2022, 11:13 AM

## 2022-12-21 NOTE — TOC CAGE-AID Note (Signed)
Transition of Care [facility]) - CAGE-AID Screening   Patient Details  Name: Lonnie [name] MRN: [id number] Date of Birth: 1951-09-09  Transition of Care [facility]) CM/SW Contact:    Katha Hamming, RN Phone Number:[phone] 12/21/2022, 10:18 PM   CAGE-AID Screening: Substance Abuse Screening unable to be completed due to: : Patient unable to participate (hx alzheimers dementia)

## 2022-12-21 NOTE — NC FL2 (Signed)
[place] MEDICAID FL2 LEVEL OF CARE FORM     IDENTIFICATION  Patient Name: Lonnie [name] Birthdate: Oct 20, 1951 Sex: male Admission Date (Current Location): 12/20/2022  [facility] and [place] Number:  Producer, television/film/video and Address:  [facility] [place], [address] [place], [place] [address]      Provider Number: [id number]  Attending Physician Name and Address:  Md, Trauma, MD  Relative Name and Phone Number:  Purves,[name] Spouse   [phone]    Current Level of Care: Hospital Recommended Level of Care: Skilled Nursing Facility Prior Approval Number:    Date Approved/Denied:   PASRR Number: [id number]  Discharge Plan: SNF    Current Diagnoses: Patient Active Problem List   Diagnosis Date Noted   Rib fractures 12/20/2022   Failure to thrive in adult 04/04/2022   Bedbound 04/04/2022   Infected prosthetic vascular graft (HCC)    Bacteremia due to Pseudomonas    Status post above-knee amputation of right lower extremity (HCC)    Ischemic cardiomyopathy    Sepsis secondary to UTI (HCC) 09/10/2020   Closed fracture of right patella 09/10/2020   PAD (peripheral artery disease) (HCC) 01/20/2020   Fronto-temporal dementia (HCC) 08/04/2019   Left shoulder pain    TBI (traumatic brain injury) (HCC)    E-coli UTI    Postoperative pain    SDH (subdural hematoma) (HCC) 07/08/2019   Subdural hematoma (HCC) 06/30/2019   Erectile dysfunction 07/28/2018   Peripheral vascular disease (HCC) 07/16/2017   Anxiety and depression 07/16/2017   PCP NOTES >>>>>>>>>>>> 07/08/2016   Dermatitis 04/19/2015   Elevated LFTs 02/12/2015   Hyperglycemia 09/01/2014   DJD (degenerative joint disease) 09/01/2014   Annual physical exam 09/01/2014   S/P CABG x 4 11/07/2013   HTN (hypertension) 10/29/2013   Tobacco abuse 10/29/2013   Coronary atherosclerosis of native coronary artery 10/29/2013    Orientation RESPIRATION BLADDER Height & Weight     Self,  Place  Normal Continent Weight: 202 lb 13.2 oz (92 kg) Height:  6\' 2"  (188 cm)  BEHAVIORAL SYMPTOMS/MOOD NEUROLOGICAL BOWEL NUTRITION STATUS      Continent Diet (see discharge summary)  AMBULATORY STATUS COMMUNICATION OF NEEDS Skin   Extensive Assist (left AKA w/prosthesis) Verbally Normal                       Personal Care Assistance Level of Assistance  Dressing, Bathing Bathing Assistance: Limited assistance   Dressing Assistance: Maximum assistance     Functional Limitations Info   (dementia)          SPECIAL CARE FACTORS FREQUENCY  PT (By licensed PT), OT (By licensed OT)     PT Frequency: per facility OT Frequency: per facility            Contractures      Additional Factors Info  Code Status, Allergies Code Status Info: FULL Allergies Info: Ace Inhibitors;Eggs Or Egg-derived Products ; Lactose Intolerance (gi); Latex           Current Medications (12/21/2022):  This is the current hospital active medication list Current Facility-Administered Medications  Medication Dose Route Frequency Provider Last Rate Last Admin   acetaminophen (TYLENOL) tablet 650 mg  650 mg Oral Q4H PRN Kinsinger, 12/23/2022, MD       enoxaparin (LOVENOX) injection 30 mg  30 mg Subcutaneous Q12H Kinsinger, De Blanch, MD   30 mg at 12/21/22 0948   HYDROmorphone (DILAUDID) injection 0.5 mg  0.5  mg Intravenous Q2H PRN Kinsinger, De Blanch, MD   0.5 mg at 12/20/22 2237   metoprolol tartrate (LOPRESSOR) injection 5 mg  5 mg Intravenous Q6H PRN Kinsinger, De Blanch, MD       morphine (PF) 2 MG/ML injection 2-4 mg  2-4 mg Intravenous Q2H PRN Kinsinger, De Blanch, MD   4 mg at 12/21/22 0454   ondansetron (ZOFRAN-ODT) disintegrating tablet 4 mg  4 mg Oral Q6H PRN Kinsinger, De Blanch, MD       Or   ondansetron [facility]) injection 4 mg  4 mg Intravenous Q6H PRN Kinsinger, De Blanch, MD       oxyCODONE (Oxy IR/ROXICODONE) immediate release tablet 5 mg  5 mg Oral Q4H PRN Kinsinger,  De Blanch, MD   5 mg at 12/21/22 3748     Discharge Medications: Please see discharge summary for a list of discharge medications.  Relevant Imaging Results:  Relevant Lab Results:   Additional Information ss#[id number].  Patient vaccinated for COVID: 02/16/20, 03/13/20, 11/04/20    PFIZER Comrnaty(Gray TOP) Covid-19 Vaccine 06/25/2021  Marya Landry Voncile Schwarz, LCSWA

## 2022-12-21 NOTE — Care Management Obs Status (Signed)
MEDICARE OBSERVATION STATUS NOTIFICATION   Patient Details  Name: Lonnie [name] MRN: [id number] Date of Birth: 06/17/1951   Medicare Observation Status Notification Given:  Yes    Bess Kinds, RN 12/21/2022, 11:13 AM

## 2022-12-21 NOTE — TOC Initial Note (Signed)
Transition of Care [facility]) - Initial/Assessment Note    Patient Details  Name: Lonnie [name] MRN: [id number] Date of Birth: 1951-03-03  Transition of Care [facility]) CM/SW Contact:    Bess Kinds, RN Phone Number: [phone] 12/21/2022, 12:32 PM  Clinical Narrative:                  Spoke with patient at the bedside to discuss post acute transition. Patient verbally consented for RNCM to speak with his spouse. Spoke with spouse, Anne Hahn, on cell phone [phone]. Spouse stated that patient has dementia and is very forgetful. Patient lives at home with spouse.   For DME, patient has a hospital bed, wheelchair, RW, and BSC. Hospital bed needs a new mattress - advise to call DME provider who provided the bed. Patient does not use walker any more since amputation d/t lack of strength and memory. Spouse stated there is a ramp but it is too steep making it difficult to maintain control when going down the ramp especially since patient has gained weight. Spouse asked about an electric wheelchair stating she was told that she needs an order - advised to follow up with PCP.   For medical care, patient participates in [facility] and receives home visits for primary care. Other medical providers are difficult to manage for transportation.   Patient does have a private pay aide Monday - Thursday from 12:30-3:30 pm. She would like to also have an aide Friday - Sunday, but stated that when they sent someone out it was not a good match. Discussed additional home care providers.   For transport home, patient will need ambulance transport. However, wife stated that patient cannot return home unless he is stronger. She stated that she hurt her shoulder trying to help him. She is agreeable to SNF stating her preferences were for [facility], [facility] [employer], and [facility] d/t proximity to home. Appreciate CSW assistance with FL2 and requesting bed offers.   TOC following for transition needs.     Expected Discharge Plan: Skilled Nursing Facility Barriers to Discharge: Continued Medical Work up   Patient Goals and CMS Choice Patient states their goals for this hospitalization and ongoing recovery are:: needs to be stronger to come home CMS Medicare.gov Compare Post Acute Care list provided to:: Patient Represenative (must comment) (spouse, [name]) Choice offered to / list presented to : Spouse      Expected Discharge Plan and Services In-house Referral: Clinical Social Work Discharge Planning Services: CM Consult Post Acute Care Choice: Skilled Nursing Facility Living arrangements for the past 2 months: Single Family Home Expected Discharge Date: 12/21/22               DME Arranged: N/A DME Agency: NA                  Prior Living Arrangements/Services Living arrangements for the past 2 months: Single Family Home Lives with:: Spouse Patient language and need for interpreter reviewed:: Yes        Need for Family Participation in Patient Care: Yes (Comment) Care giver support system in place?: Yes (comment) Current home services: Homehealth aide, DME Criminal Activity/Legal Involvement Pertinent to Current Situation/Hospitalization: No - Comment as needed  Activities of Daily Living Home Assistive Devices/Equipment: Dan Humphreys (specify type) ADL Screening (condition at time of admission) Patient's cognitive ability adequate to safely complete daily activities?: No Is the patient deaf or have difficulty hearing?: No Does the patient have difficulty seeing, even when wearing glasses/contacts?:  No Does the patient have difficulty concentrating, remembering, or making decisions?: Yes Patient able to express need for assistance with ADLs?: Yes Does the patient have difficulty dressing or bathing?: No Independently performs ADLs?: No Does the patient have difficulty walking or climbing stairs?: Yes Weakness of Legs: Right Weakness of Arms/Hands: None  Permission  Sought/Granted      Share Information with NAME: Anne Hahn [place]     Permission granted to share info w Relationship: spouse  Permission granted to share info w Contact Information: [phone]  Emotional Assessment Appearance:: Appears stated age Attitude/Demeanor/Rapport: Engaged Affect (typically observed): Accepting Orientation: : Fluctuating Orientation (Suspected and/or reported Sundowners) Alcohol / Substance Use: Not Applicable Psych Involvement: No (comment)  Admission diagnosis:  Rib fractures [S22.49XA] Fall, initial encounter [W19.XXXA] Closed fracture of multiple ribs of right side, initial encounter [S22.41XA] Patient Active Problem List   Diagnosis Date Noted   Rib fractures 12/20/2022   Failure to thrive in adult 04/04/2022   Bedbound 04/04/2022   Infected prosthetic vascular graft (HCC)    Bacteremia due to Pseudomonas    Status post above-knee amputation of right lower extremity (HCC)    Ischemic cardiomyopathy    Sepsis secondary to UTI (HCC) 09/10/2020   Closed fracture of right patella 09/10/2020   PAD (peripheral artery disease) (HCC) 01/20/2020   Fronto-temporal dementia (HCC) 08/04/2019   Left shoulder pain    TBI (traumatic brain injury) (HCC)    E-coli UTI    Postoperative pain    SDH (subdural hematoma) (HCC) 07/08/2019   Subdural hematoma (HCC) 06/30/2019   Erectile dysfunction 07/28/2018   Peripheral vascular disease (HCC) 07/16/2017   Anxiety and depression 07/16/2017   PCP NOTES >>>>>>>>>>>> 07/08/2016   Dermatitis 04/19/2015   Elevated LFTs 02/12/2015   Hyperglycemia 09/01/2014   DJD (degenerative joint disease) 09/01/2014   Annual physical exam 09/01/2014   S/P CABG x 4 11/07/2013   HTN (hypertension) 10/29/2013   Tobacco abuse 10/29/2013   Coronary atherosclerosis of native coronary artery 10/29/2013   PCP:  Nona Dell, NP Pharmacy:   [facility] - [place], [place] - [address] [place] [address] Phone: [phone] Fax: [phone]  [facility] [phone] - [address] [place] - [address] AT [facility] [address] [place] [address] Phone: [phone] Fax: [phone]  [id number] [place], [place] - [address] AT [facility] [address] [place] [address] Phone: [phone] Fax: [phone]     Social Determinants of Health (SDOH) Social History: SDOH Screenings   Food Insecurity: No Food Insecurity (12/20/2022)  Housing: Low Risk  (12/20/2022)  Transportation Needs: No Transportation Needs (12/20/2022)  Utilities: Not At Risk (12/20/2022)  Alcohol Screen: Low Risk  (05/09/2021)  Depression (PHQ2-9): Low Risk  (02/13/2022)  Financial Resource Strain: Low Risk  (05/09/2021)  Physical Activity: Inactive (05/09/2021)  Social Connections: Moderately Isolated (05/09/2021)  Stress: No Stress Concern Present (05/09/2021)  Tobacco Use: Medium Risk (12/20/2022)   SDOH Interventions:     Readmission Risk Interventions    06/14/2021    3:03 PM 09/12/2020   11:41 AM  Readmission Risk Prevention Plan  Post Dischage Appt Complete   Medication Screening Complete   Transportation Screening Complete Complete  PCP or Specialist Appt within 3-5 Days  Complete  HRI or Home Care Consult  Complete  Social Work Consult for Recovery Care Planning/Counseling  Complete  Palliative Care Screening  Not Applicable  Medication Review (RN Care Manager)  Complete

## 2022-12-22 DIAGNOSIS — S2241XA Multiple fractures of ribs, right side, initial encounter for closed fracture: Secondary | ICD-10-CM | POA: Diagnosis not present

## 2022-12-22 DIAGNOSIS — F0390 Unspecified dementia without behavioral disturbance: Secondary | ICD-10-CM | POA: Diagnosis not present

## 2022-12-22 DIAGNOSIS — S2241 Multiple fractures of ribs, right side: Secondary | ICD-10-CM | POA: Diagnosis not present

## 2022-12-22 MED ORDER — CLONAZEPAM 0.5 MG PO TABS: 0.5000 mg | ORAL_TABLET | Freq: Two times a day (BID) | ORAL | Status: DC

## 2022-12-22 MED ORDER — CLONAZEPAM 0.5 MG PO TABS
0.5000 mg | ORAL_TABLET | Freq: Two times a day (BID) | ORAL | Status: DC
  Administered 2022-12-22 – 2022-12-25 (×6): 0.5 mg via ORAL
  Filled 2022-12-22 (×6): qty 1

## 2022-12-22 NOTE — Progress Notes (Signed)
Subjective/Chief Complaint: Patient has no complaints.   Objective: Vital signs in last 24 hours: Temp:  [98.4 F (36.9 C)-98.7 F (37.1 C)] 98.4 F (36.9 C) (01/01 0548) Pulse Rate:  [69-72] 69 (01/01 0548) Resp:  [16] 16 (12/31 1558) BP: (125-141)/(66-82) 141/82 (01/01 0548) SpO2:  [96 %-99 %] 99 % (01/01 0548) Last BM Date : 12/20/22  Intake/Output from previous day: 12/31 0701 - 01/01 0700 In: -  Out: 650 [Urine:650] Intake/Output this shift: No intake/output data recorded.  General appearance: alert and cooperative Resp: clear to auscultation bilaterally Cardio: Normal sinus rhythm GI: Soft nontender mild distention  Lab Results:  Recent Labs    12/20/22 1653 12/20/22 2327  WBC 12.3* 12.6*  HGB 12.6* 13.0  HCT 39.4 40.1  PLT 289 295   BMET Recent Labs    12/20/22 1653 12/20/22 2327  NA 138  --   K 3.9  --   CL 104  --   CO2 24  --   GLUCOSE 163*  --   BUN 19  --   CREATININE 1.07 1.15  CALCIUM 9.4  --    PT/INR No results for input(s): "LABPROT", "INR" in the last 72 hours. ABG No results for input(s): "PHART", "HCO3" in the last 72 hours.  Invalid input(s): "PCO2", "PO2"  Studies/Results: DG Shoulder Right Portable  Result Date: 12/20/2022 CLINICAL DATA:  fall EXAM: RIGHT SHOULDER - 1 VIEW COMPARISON:  X-ray right shoulder 02/13/2022 FINDINGS: No evidence of fracture or dislocation. No evidence of severe arthropathy. No aggressive appearing focal bone abnormality. Soft tissues are unremarkable. IMPRESSION: Negative. Electronically Signed   By: Iven Finn M.D.   On: 12/20/2022 19:09   CT HEAD WO CONTRAST (5MM)  Result Date: 12/20/2022 CLINICAL DATA:  Head trauma, minor (Age >= 65y) EXAM: CT HEAD WITHOUT CONTRAST TECHNIQUE: Contiguous axial images were obtained from the base of the skull through the vertex without intravenous contrast. RADIATION DOSE REDUCTION: This exam was performed according to the departmental dose-optimization  program which includes automated exposure control, adjustment of the mA and/or kV according to patient size and/or use of iterative reconstruction technique. COMPARISON:  None Available. BRAIN: BRAIN Cerebral ventricle sizes are concordant with the degree of cerebral volume loss. Patchy and confluent areas of decreased attenuation are noted throughout the deep and periventricular white matter of the cerebral hemispheres bilaterally, compatible with chronic microvascular ischemic disease. Left occipital encephalomalacia. No evidence of large-territorial acute infarction. No parenchymal hemorrhage. No mass lesion. Bilateral chronic hygromas. No acute extra-axial collection. No mass effect or midline shift. No hydrocephalus. Basilar cisterns are patent. Empty sella. Vascular: No hyperdense vessel. Skull: No acute fracture or focal lesion.  Prior left craniotomy Sinuses/Orbits: Bilateral maxillary, right ethmoid, bilateral ethmoid sinus mucosal thickening. Otherwise visualized paranasal sinuses and mastoid air cells are clear. The orbits are unremarkable. Other: None. IMPRESSION: 1. No acute intracranial abnormality. 2. Empty sella. Findings is often a normal anatomic variant but can be associated with idiopathic intracranial hypertension (pseudotumor cerebri). Electronically Signed   By: Iven Finn M.D.   On: 12/20/2022 19:09   CT Cervical Spine Wo Contrast  Result Date: 12/20/2022 CLINICAL DATA:  Trauma, fell 3 days ago EXAM: CT CERVICAL SPINE WITHOUT CONTRAST TECHNIQUE: Multidetector CT imaging of the cervical spine was performed without intravenous contrast. Multiplanar CT image reconstructions were also generated. RADIATION DOSE REDUCTION: This exam was performed according to the departmental dose-optimization program which includes automated exposure control, adjustment of the mA and/or kV according to patient  size and/or use of iterative reconstruction technique. COMPARISON:  None Available. FINDINGS:  Alignment: Reversal of cervical lordosis. Facet alignment within normal limits. Trace retrolisthesis C4 on C5. Skull base and vertebrae: No acute fracture. No primary bone lesion or focal pathologic process. Soft tissues and spinal canal: No prevertebral fluid or swelling. No visible canal hematoma. Disc levels: Ankylosis/fusion at C3-C4. Severe disc space narrowing and degenerative change C4-C5, C5-C6 and C6-C7. Facet degenerative changes at multiple levels with foraminal narrowing Upper chest: Emphysema and apical scarring Other: None IMPRESSION: 1. Reversal of cervical lordosis with advanced degenerative changes. No acute osseous abnormality. 2. Emphysema. Emphysema (ICD10-J43.9). Electronically Signed   By: Donavan Foil M.D.   On: 12/20/2022 19:07   DG Ribs Unilateral W/Chest Right  Result Date: 12/20/2022 CLINICAL DATA:  Trauma, fall, pain EXAM: RIGHT RIBS AND CHEST - 3+ VIEW COMPARISON:  Chest radiograph done on 07/04/2021 FINDINGS: Transverse diameter of heart is slightly increased. There is previous coronary bypass surgery. Small linear density in left lower lung fields suggest scarring or subsegmental atelectasis. There is poor inspiration. Displaced fractures are seen in the posterolateral aspect of right seventh, eighth and ninth ribs. IMPRESSION: Recent displaced fractures are seen in posterolateral aspects of right seventh, eighth and ninth ribs. There is no demonstrable focal pulmonary contusion. There is no pleural effusion or pneumothorax. Linear densities in left lower lung fields suggest scarring or subsegmental atelectasis. Previous coronary bypass surgery. Electronically Signed   By: Elmer Picker M.D.   On: 12/20/2022 16:42    Anti-infectives: Anti-infectives (From admission, onward)    None       Assessment/Plan: Rib fractures 7 through 9 right  Patient evaluated by physical therapy and felt that inpatient rehab would be more beneficial given history of falls and  previous comorbid conditions.  Clinically he is clear from our standpoint to be placed and/or go home.  Await placement for inpatient rehab     LOS: 1 day    Joyice Faster Jamica Woodyard MD  12/22/2022 Total time 20 minutes

## 2022-12-23 DIAGNOSIS — S2241 Multiple fractures of ribs, right side: Secondary | ICD-10-CM | POA: Diagnosis not present

## 2022-12-23 DIAGNOSIS — S2241XA Multiple fractures of ribs, right side, initial encounter for closed fracture: Secondary | ICD-10-CM | POA: Diagnosis not present

## 2022-12-23 DIAGNOSIS — F0390 Unspecified dementia without behavioral disturbance: Secondary | ICD-10-CM | POA: Diagnosis not present

## 2022-12-23 MED ORDER — CLOPIDOGREL BISULFATE 75 MG PO TABS
75.0000 mg | ORAL_TABLET | Freq: Every day | ORAL | Status: DC
  Administered 2022-12-23 – 2022-12-25 (×3): 75 mg via ORAL
  Filled 2022-12-23 (×3): qty 1

## 2022-12-23 MED ORDER — IRBESARTAN 300 MG PO TABS
300.0000 mg | ORAL_TABLET | Freq: Every day | ORAL | Status: DC
  Administered 2022-12-23 – 2022-12-25 (×3): 300 mg via ORAL
  Filled 2022-12-23 (×3): qty 1

## 2022-12-23 MED ORDER — MELATONIN 3 MG PO TABS: 3.0000 mg | ORAL_TABLET | Freq: Every evening | ORAL | Status: DC | PRN

## 2022-12-23 MED ORDER — CLOPIDOGREL BISULFATE 75 MG PO TABS: 75.0000 mg | ORAL_TABLET | Freq: Every day | ORAL | Status: DC

## 2022-12-23 MED ORDER — PANTOPRAZOLE SODIUM 40 MG PO TBEC
40.0000 mg | DELAYED_RELEASE_TABLET | Freq: Every day | ORAL | Status: DC
  Administered 2022-12-23 – 2022-12-24 (×2): 40 mg via ORAL
  Filled 2022-12-23 (×2): qty 1

## 2022-12-23 MED ORDER — GABAPENTIN 100 MG PO CAPS
100.0000 mg | ORAL_CAPSULE | Freq: Three times a day (TID) | ORAL | Status: DC
  Administered 2022-12-23 – 2022-12-25 (×7): 100 mg via ORAL
  Filled 2022-12-23 (×7): qty 1

## 2022-12-23 MED ORDER — CITALOPRAM HYDROBROMIDE 20 MG PO TABS
10.0000 mg | ORAL_TABLET | Freq: Two times a day (BID) | ORAL | Status: DC
  Administered 2022-12-23 – 2022-12-25 (×5): 10 mg via ORAL
  Filled 2022-12-23 (×5): qty 1

## 2022-12-23 MED ORDER — QUETIAPINE FUMARATE 200 MG PO TABS
200.0000 mg | ORAL_TABLET | Freq: Two times a day (BID) | ORAL | Status: DC
  Administered 2022-12-23 – 2022-12-25 (×5): 200 mg via ORAL
  Filled 2022-12-23 (×5): qty 1

## 2022-12-23 MED ORDER — FUROSEMIDE 20 MG PO TABS: 20.0000 mg | ORAL_TABLET | Freq: Every day | ORAL | Status: DC | PRN

## 2022-12-23 MED ORDER — ATORVASTATIN CALCIUM 80 MG PO TABS
80.0000 mg | ORAL_TABLET | Freq: Every day | ORAL | Status: DC
  Administered 2022-12-23 – 2022-12-25 (×3): 80 mg via ORAL
  Filled 2022-12-23 (×3): qty 1

## 2022-12-23 MED ORDER — TAMSULOSIN HCL 0.4 MG PO CAPS
0.4000 mg | ORAL_CAPSULE | Freq: Every day | ORAL | Status: DC
  Administered 2022-12-23 – 2022-12-24 (×2): 0.4 mg via ORAL
  Filled 2022-12-23 (×2): qty 1

## 2022-12-23 MED ORDER — METOPROLOL TARTRATE 12.5 MG HALF TABLET
12.5000 mg | ORAL_TABLET | Freq: Two times a day (BID) | ORAL | Status: DC
  Administered 2022-12-23 – 2022-12-25 (×5): 12.5 mg via ORAL
  Filled 2022-12-23 (×5): qty 1

## 2022-12-23 NOTE — Progress Notes (Signed)
   Subjective/Chief Complaint: Having some mild pain from rib fractures but not worsening. He does not remember working with PT yesterday and states he wants to go home. No other complaints  Objective: Vital signs in last 24 hours: Temp:  [98.3 F (36.8 C)-98.9 F (37.2 C)] 98.3 F (36.8 C) (01/02 0815) Pulse Rate:  [81-96] 84 (01/02 0815) Resp:  [17-18] 17 (01/02 0815) BP: (139-166)/(79-98) 166/79 (01/02 0815) SpO2:  [95 %-100 %] 95 % (01/02 0815) Last BM Date : 12/20/22  Intake/Output from previous day: 01/01 0701 - 01/02 0700 In: 920 [P.O.:920] Out: 200 [Urine:200] Intake/Output this shift: No intake/output data recorded.  General appearance: alert and cooperative Resp: clear to auscultation bilaterally Cardio: Normal sinus rhythm GI: Soft nontender, no distention MSK: R BKA, L LE without edema, calf NT to palpation  Psych: A&Ox3  Lab Results:  Recent Labs    12/20/22 1653 12/20/22 2327  WBC 12.3* 12.6*  HGB 12.6* 13.0  HCT 39.4 40.1  PLT 289 295    BMET Recent Labs    12/20/22 1653 12/20/22 2327  NA 138  --   K 3.9  --   CL 104  --   CO2 24  --   GLUCOSE 163*  --   BUN 19  --   CREATININE 1.07 1.15  CALCIUM 9.4  --     PT/INR No results for input(s): "LABPROT", "INR" in the last 72 hours. ABG No results for input(s): "PHART", "HCO3" in the last 72 hours.  Invalid input(s): "PCO2", "PO2"  Studies/Results: No results found.  Anti-infectives: Anti-infectives (From admission, onward)    None       Assessment/Plan: Fall R Rib fractures 7-9 - pain control. IS and pulm toilet ICM/CHF - home lasix ordered prn as prescribed HTN - home metoprolol, irbesartan CAD - h/o MI and CABG 2014, resume home plavix H/o trigeminal neuralgia - home gabapentin GERD - home protonix BPH - flomax Prediabetes Depression/Anxiety - home meds H/o stroke Dementia - per chart review has been progressing and palliative care SW has been involved  FEN:  regular ID: none indicated  VTE: lovenox, plavix  Dispo: PT recommending SNF rehab. OT to see. Medically stable for dc to SNF once arranged   LOS: 0 days   Winferd Humphrey, [facility] Surgery 12/23/2022, 8:56 AM Please see Amion for pager number during day hours 7:00am-4:30pm  Total time 20 minutes

## 2022-12-23 NOTE — TOC Progression Note (Addendum)
Transition of Care [facility]) - Progression Note    Patient Details  Name: Lonnie [name] MRN: [id number] Date of Birth: [birth year]  [facility] CM/SW Contact  Oren Section Cleta Alberts, RN Phone Number: 12/23/2022, 2:49 PM  Clinical Narrative:    Patient has SNF bed offer at [facility] in [place].  Spoke with patient's daughter, Lonnie [name] and patient's wife, Lonnie [name], and they are accepting of offer.  Left message for admissions department at [facility] to confirm bed offer.  Will need insurance authorization for SNF prior to discharge.  Message left with Therapy Department, requesting PT/OT follow-up early tomorrow if possible, as updated PT/OT notes needed for insurance authorization.   Expected Discharge Plan: [place] Barriers to Discharge: Continued Medical Work up  Expected Discharge Plan and Services In-house Referral: Clinical Social Work Discharge Planning Services: CM Consult Post Acute Care Choice: [place] Living arrangements for the past 2 months: [place] Expected Discharge Date: 12/21/22               DME Arranged: N/A DME Agency: NA                   Social Determinants of Health (SDOH) Interventions SDOH Screenings   Food Insecurity: No Food Insecurity (12/20/2022)  Housing: Low Risk  (12/20/2022)  Transportation Needs: No Transportation Needs (12/20/2022)  Utilities: Not At Risk (12/20/2022)  Alcohol Screen: Low Risk  (05/09/2021)  Depression (PHQ2-9): Low Risk  (02/13/2022)  Financial Resource Strain: Low Risk  (05/09/2021)  Physical Activity: Inactive (05/09/2021)  Social Connections: Moderately Isolated (05/09/2021)  Stress: No Stress Concern Present (05/09/2021)  Tobacco Use: Medium Risk (12/20/2022)    Readmission Risk Interventions    06/14/2021    3:03 PM 09/12/2020   11:41 AM  Readmission Risk Prevention Plan  Post Dischage Appt Complete   Medication Screening Complete   Transportation  Screening Complete Complete  PCP or Specialist Appt within 3-5 Days  Complete  HRI or [place]  Complete  Social Work Consult for [place] Planning/Counseling  Complete  Palliative Care Screening  Not Applicable  Medication Review (RN Care Manager)  Complete   Reinaldo Raddle, RN, BSN  Trauma/Neuro ICU Case Manager [phone]

## 2022-12-23 NOTE — Progress Notes (Signed)
Mobility Specialist - Progress Note   12/23/22 1400  Mobility  Activity Transferred from bed to chair  Level of Assistance Moderate assist, patient does 50-74%  Assistive Device Other (Comment) (HHA)  Activity Response Tolerated well  Mobility Referral Yes  $Mobility charge 1 Mobility    Pt received in bed agreeable to mobility. ModA to laterally scoot to recliner. HHA and bed pad used to help pt scoot. Left in recliner w/ chair alarm on and call bell at his side.   [place] Specialist Please contact via SecureChat or Rehab office at [phone]

## 2022-12-24 DIAGNOSIS — S2241 Multiple fractures of ribs, right side: Secondary | ICD-10-CM | POA: Diagnosis not present

## 2022-12-24 DIAGNOSIS — F0390 Unspecified dementia without behavioral disturbance: Secondary | ICD-10-CM | POA: Diagnosis not present

## 2022-12-24 DIAGNOSIS — S2241XA Multiple fractures of ribs, right side, initial encounter for closed fracture: Secondary | ICD-10-CM | POA: Diagnosis not present

## 2022-12-24 NOTE — TOC Progression Note (Signed)
Transition of Care [facility]) - Progression Note    Patient Details  Name: Lonnie [name] MRN: [id number] Date of Birth: [birth year]  Transition of Care [facility]) CM/SW Contact  Oren Section Cleta Alberts, RN Phone Number: 12/24/2022, 3:54 PM  Clinical Narrative:    Mr Miera has been approved by insurance provider; 1/3 - 1/5 Blaine Hamper ID [id number]; Next review date 1/5.  [place] states they can accept patient in the AM.  Notified patient's daughter, [name], and she is in agreement with plan.    Expected Discharge Plan: [place] Barriers to Discharge: Continued Medical Work up  Expected Discharge Plan and Services In-house Referral: Clinical Social Work Discharge Planning Services: CM Consult Post Acute Care Choice: [place] Living arrangements for the past 2 months: [place] Expected Discharge Date: 12/21/22               DME Arranged: N/A DME Agency: NA                   Social Determinants of Health (SDOH) Interventions SDOH Screenings   Food Insecurity: No Food Insecurity (12/20/2022)  Housing: Low Risk  (12/20/2022)  Transportation Needs: No Transportation Needs (12/20/2022)  Utilities: Not At Risk (12/20/2022)  Alcohol Screen: Low Risk  (05/09/2021)  Depression (PHQ2-9): Low Risk  (02/13/2022)  Financial Resource Strain: Low Risk  (05/09/2021)  Physical Activity: Inactive (05/09/2021)  Social Connections: Moderately Isolated (05/09/2021)  Stress: No Stress Concern Present (05/09/2021)  Tobacco Use: Medium Risk (12/20/2022)    Readmission Risk Interventions    06/14/2021    3:03 PM 09/12/2020   11:41 AM  Readmission Risk Prevention Plan  Post Dischage Appt Complete   Medication Screening Complete   Transportation Screening Complete Complete  PCP or Specialist Appt within 3-5 Days  Complete  HRI or [place]  Complete  Social Work Consult for [place] Planning/Counseling  Complete  Palliative Care Screening   Not Applicable  Medication Review (RN Care Manager)  Complete   Reinaldo Raddle, RN, BSN  Trauma/Neuro ICU Case Manager [phone]

## 2022-12-24 NOTE — Progress Notes (Signed)
   Subjective/Chief Complaint: Sitting up in chair. No new complaints. Only having rib pain with direct pressure to site. No SHOB  Objective: Vital signs in last 24 hours: Temp:  [98.2 F (36.8 C)-99.2 F (37.3 C)] 99.2 F (37.3 C) (01/03 0721) Pulse Rate:  [77-89] 89 (01/03 0721) Resp:  [16-18] 16 (01/03 0721) BP: (107-136)/(61-74) 120/73 (01/03 0721) SpO2:  [95 %-98 %] 96 % (01/03 0721) Last BM Date : 12/20/22  Intake/Output from previous day: 01/02 0701 - 01/03 0700 In: 240 [P.O.:240] Out: 250 [Urine:250] Intake/Output this shift: No intake/output data recorded.  General appearance: alert and cooperative Resp: clear to auscultation bilaterally Cardio: Normal sinus rhythm GI: Soft nontender, no distention MSK: R BKA, L LE without edema, calf NT to palpation  Psych: A&Ox3  Lab Results:  No results for input(s): "WBC", "HGB", "HCT", "PLT" in the last 72 hours.  BMET No results for input(s): "NA", "K", "CL", "CO2", "GLUCOSE", "BUN", "CREATININE", "CALCIUM" in the last 72 hours.  PT/INR No results for input(s): "LABPROT", "INR" in the last 72 hours. ABG No results for input(s): "PHART", "HCO3" in the last 72 hours.  Invalid input(s): "PCO2", "PO2"  Studies/Results: No results found.  Anti-infectives: Anti-infectives (From admission, onward)    None       Assessment/Plan: Fall R Rib fractures 7-9 - pain control. IS and pulm toilet ICM/CHF - home lasix ordered prn as prescribed HTN - home metoprolol, irbesartan CAD - h/o MI and CABG 2014, resume home plavix H/o trigeminal neuralgia - home gabapentin GERD - home protonix BPH - flomax Prediabetes Depression/Anxiety - home meds H/o stroke Dementia - per chart review has been progressing and palliative care SW has been involved  FEN: regular ID: none indicated  VTE: lovenox, plavix  Dispo: PT recommending SNF rehab. Medically stable for dc to SNF once arranged - has bed offer   LOS: 0 days    Winferd Humphrey, [facility] Surgery 12/24/2022, 8:43 AM Please see Amion for pager number during day hours 7:00am-4:30pm

## 2022-12-24 NOTE — Discharge Summary (Incomplete)
[facility] Discharge Summary   Patient ID: BENEDETTO RYDER MRN: [id number] DOB/AGE: 12-30-1950 72 y.o.  Admit date: 12/20/2022 Discharge date: 12/25/2022   Discharge Diagnosis Fall R Rib fractures 7-9 ICM/CHF HTN  CAD H/o trigeminal neuralgia  GERD  BPH  Prediabetes Depression/Anxiety  H/o stroke Dementia   Consultants None  Imaging: No results found.  Procedures None  Hospital Course:  Lonnie [name] is a 72 y.o. male with multiple medical issues (listed above) who presented to [facility] 12/30 after falling 3 days prior to presentation. States that he lost his balance and suffered a ground level fall, striking his right chest. He did not hit his head or lose consciousness.  Workup showed right rib fractures 7-9.  Patient was admitted to the trauma service for multimodal pain control and pulmonary toilet. Patient worked with therapies during this admission who recommended SNF. On 12/25/22 the patient was felt stable for discharge to SNF.  Patient will follow up as below and knows to call with questions or concerns.    I have personally reviewed the patients medication history on the [place] controlled substance database.   Physical Exam: General appearance: alert and cooperative Resp: clear to auscultation bilaterally Cardio: Normal sinus rhythm GI: Soft nontender, no distention MSK: R BKA, L LE without edema, calf NT to palpation  Psych: A&Ox3     Allergies as of 12/25/2022       Reactions   Ace Inhibitors Swelling, Other (See Comments)   Angioedema   Eggs Or Egg-derived Products Nausea Only, Other (See Comments)   Cannot eat prepared eggs- Occasional nausea   Lactose Intolerance (gi) Diarrhea, Nausea Only, Other (See Comments)   Flatulence, also   Latex Itching        Medication List     STOP taking these medications    Oxycodone HCl 10 MG Tabs   oxyCODONE-acetaminophen 5-325 MG tablet Commonly known as: Percocet       TAKE these  medications    acetaminophen 500 MG tablet Commonly known as: TYLENOL Take 500 mg by mouth every 6 (six) hours as needed for mild pain, fever or headache.   atorvastatin 80 MG tablet Commonly known as: LIPITOR TAKE 1 TABLET EVERY DAY   citalopram 20 MG tablet Commonly known as: CELEXA TAKE 1/2 TABLET TWICE DAILY   clonazePAM 0.5 MG tablet Commonly known as: KLONOPIN Take 0.5 mg by mouth 2 (two) times daily.   clopidogrel 75 MG tablet Commonly known as: PLAVIX TAKE 1 TABLET EVERY DAY   Ferrous Fumarate 324 (106 Fe) MG Tabs tablet Commonly known as: HEMOCYTE - 106 mg FE Take 1 tablet (106 mg of iron total) by mouth daily.   furosemide 20 MG tablet Commonly known as: LASIX TAKE 1 TABLET BY MOUTH DAILY AS NEEDED FOR EDEMA What changed: See the new instructions.   gabapentin 100 MG capsule Commonly known as: NEURONTIN Take 100 mg by mouth 3 (three) times daily.   gabapentin 300 MG capsule Commonly known as: Neurontin Take 1 capsule (300 mg total) by mouth 3 (three) times daily.   irbesartan 300 MG tablet Commonly known as: AVAPRO TAKE 1 TABLET EVERY DAY   melatonin 3 MG Tabs tablet Take 3 mg by mouth at bedtime.   metoprolol tartrate 25 MG tablet Commonly known as: LOPRESSOR TAKE 1/2 TABLET TWICE DAILY   multivitamin with minerals Tabs tablet Take 1 tablet by mouth in the morning.   pantoprazole 40 MG tablet Commonly known as: PROTONIX TAKE 1  TABLET AT BEDTIME   QUEtiapine 200 MG tablet Commonly known as: SEROQUEL TAKE 1 TABLET TWICE DAILY   tamsulosin 0.4 MG Caps capsule Commonly known as: FLOMAX Take 1 capsule (0.4 mg total) by mouth daily after supper.   traMADol 50 MG tablet Commonly known as: ULTRAM Take 1 tablet (50 mg total) by mouth 3 (three) times daily as needed for up to 3 days for moderate pain or severe pain.          Follow-up Information     Schmerge, Meredeth Ide, NP. Schedule an appointment as soon as possible for a visit in 1  week(s).   Specialty: Nurse Practitioner Contact information: [address] [place] [address] [phone]                  Signed: Richard Miu, [facility] 12/25/2022, 7:26 AM Please see Amion for pager number during day hours 7:00am-4:30pm

## 2022-12-24 NOTE — Progress Notes (Signed)
Physical Therapy Treatment Patient Details Name: Lonnie [name] MRN: [id number] DOB: [birth year] Today's Date: 12/24/2022   History of Present Illness 72 yo male fell and lost his balance 3 days ago. He had worsening rib pain and went to the ED where he was found to have 3 rib fractures on the Right;  has a pertinent past medical history of L AKA, CAD (coronary artery disease), Chronic combined systolic and diastolic CHF (congestive heart failure) ([place]), Dementia ([place]), Depression, Falls, Hypertension, Ischemic cardiomyopathy, MI (myocardial infarction) ([place]), Peripheral neuropathy, Prediabetes (09/01/2014), PVD (peripheral vascular disease) ([place]), Stroke ([place]), Subdural hematoma ([place]), Tobacco abuse, and UTI (urinary tract infection).    PT Comments    Pt is progressing with functional mobility. He continues below baseline due to pain in the R ribs. Pt demonstrates the potential to make good progress in rehab. Currently due to pt current functional status, available assistance at home and functional mobility vs. PLOF recommend skilled physical therapy services in SNF setting on discharge from acute care hosiptal setting in order to decrease level of physical assistance required, decrease risk for falls, immobility, injury and re-hospitalization. Pt demonstrates no signs/symptoms of cardiac/respiratory distress throughout session.     Recommendations for follow up therapy are one component of a multi-disciplinary discharge planning process, led by the attending physician.  Recommendations may be updated based on patient status, additional functional criteria and insurance authorization.  Follow Up Recommendations  Skilled nursing-short term rehab (<3 hours/day) Can patient physically be transported by private vehicle: No   Assistance Recommended at Discharge Frequent or constant Supervision/Assistance  Patient can return home with the following A little help with walking and/or transfers;Assist  for transportation;Help with stairs or ramp for entrance;Assistance with cooking/housework   Equipment Recommendations  Other (comment);None recommended by PT (defer to post acute)    Recommendations for Other Services       Precautions / Restrictions Precautions Precautions: Fall Required Braces or Orthoses: Other Brace Other Brace: R prosthesis, useful for transfers Restrictions Weight Bearing Restrictions: No     Mobility  Bed Mobility               General bed mobility comments: Pt was received sitting in recliner and return to sitting EOB Patient Response: Cooperative  Transfers Overall transfer level: Needs assistance Equipment used: None Transfers: Bed to chair/wheelchair/BSC            Lateral/Scoot Transfers: Min guard General transfer comment: Pt does well transfering to the R. Pt states that he normally transfers to the R and it is easier for him.    Ambulation/Gait     General Gait Details: not performed at this time. Pt did not have prothesis on and states that he hasn't worn it in a while.        Balance Overall balance assessment: Mild deficits observed, not formally tested           Cognition Arousal/Alertness: Awake/alert Behavior During Therapy: WFL for tasks assessed/performed Overall Cognitive Status: No family/caregiver present to determine baseline cognitive functioning       General Comments: Noted dementia in his PMH pt able to state time, place and situation but stated that he thought his wife was in the room next door becuase of her diabetes.           General Comments General comments (skin integrity, edema, etc.): Pt is doing well with functional mobility but below baseline and currently requires physical assistance to prevent falls.  Pertinent Vitals/Pain Pain Assessment Pain Assessment: Faces Faces Pain Scale: Hurts a little bit Pain Location: R ribs, pt grimaced a little with transfer but then stated he  grimaced becuase he anticapted pain but didn't really feel any.    Home Living Family/patient expects to be discharged to:: Private residence Living Arrangements: Spouse/significant other Available Help at Discharge: Family;Personal care attendant;Available PRN/intermittently Type of Home: House Home Access: Level entry       Home Layout: One level Home Equipment: Conservation officer, nature (2 wheels);Shower seat;Hand held shower head;Grab bars - tub/shower;Wheelchair - manual Additional Comments: Personal care attendant assists a few times a week        PT Goals (current goals can now be found in the care plan section) Acute Rehab PT Goals Patient Stated Goal: less pain; to move well enough to go home PT Goal Formulation: With patient Time For Goal Achievement: 01/04/23 Potential to Achieve Goals: Good Progress towards PT goals: Progressing toward goals    Frequency    Min 2X/week      PT Plan Current plan remains appropriate       AM-PAC PT "6 Clicks" Mobility   Outcome Measure  Help needed turning from your back to your side while in a flat bed without using bedrails?: A Little Help needed moving from lying on your back to sitting on the side of a flat bed without using bedrails?: A Little Help needed moving to and from a bed to a chair (including a wheelchair)?: A Little Help needed standing up from a chair using your arms (e.g., wheelchair or bedside chair)?: Total Help needed to walk in hospital room?: Total Help needed climbing 3-5 steps with a railing? : Total 6 Click Score: 12    End of Session Equipment Utilized During Treatment: Gait belt (at axilla, no pulling on gait belt just for safety) Activity Tolerance: Patient tolerated treatment well Patient left: with call bell/phone within reach;in bed;with bed alarm set Nurse Communication: Mobility status PT Visit Diagnosis: Other abnormalities of gait and mobility (R26.89);Pain Pain - Right/Left: Right Pain - part of  body:  (ribs)     Time: [phone] (PA in room for 5 min) PT Time Calculation (min) (ACUTE ONLY): 20 min  Charges:  $Therapeutic Activity: 8-22 mins                    Tomma Rakers, DPT, CLT  Acute Rehabilitation Services Office: [phone] (Secure chat preferred)    Ander Purpura 12/24/2022, 9:51 AM

## 2022-12-24 NOTE — Evaluation (Signed)
Occupational Therapy Evaluation Patient Details Name: Lonnie [name] MRN: [id number] DOB: 13-Feb-1951 Today's Date: 12/24/2022   History of Present Illness 72 yo male fell and lost his balance 3 days ago. He had worsening rib pain and went to the ED where he was found to have 3 rib fractures on the Right;  has a pertinent past medical history of L AKA, CAD (coronary artery disease), Chronic combined systolic and diastolic CHF (congestive heart failure) ([place]), Dementia ([place]), Depression, Falls, Hypertension, Ischemic cardiomyopathy, MI (myocardial infarction) ([place]), Peripheral neuropathy, Prediabetes (09/01/2014), PVD (peripheral vascular disease) ([place]), Stroke ([place]), Subdural hematoma ([place]), Tobacco abuse, and UTI (urinary tract infection).   Clinical Impression   Prior to this admission, patient living with wife and transferring at w/c level, with PCA coming 2x a week for bathing and higher level ADLs. Currently, patient is mod A for bed mobility, transfers, and ADLs. Patient completing lateral scoot transfer to recliner, however due to poor activity tolerance, unable to engage in further activity to increase strength. OT recommending short term SNF at discharge to promote return to PLOF.     Recommendations for follow up therapy are one component of a multi-disciplinary discharge planning process, led by the attending physician.  Recommendations may be updated based on patient status, additional functional criteria and insurance authorization.   Follow Up Recommendations  Skilled nursing-short term rehab (<3 hours/day)     Assistance Recommended at Discharge Frequent or constant Supervision/Assistance  Patient can return home with the following A lot of help with walking and/or transfers;A lot of help with bathing/dressing/bathroom;Assistance with cooking/housework;Direct supervision/assist for medications management;Direct supervision/assist for financial management;Assist for  transportation    Functional Status Assessment  Patient has had a recent decline in their functional status and demonstrates the ability to make significant improvements in function in a reasonable and predictable amount of time.  Equipment Recommendations  None recommended by OT (Defer to next venue)    Recommendations for Other Services       Precautions / Restrictions Precautions Precautions: Fall Required Braces or Orthoses: Other Brace Other Brace: R prosthesis, useful for transfers Restrictions Weight Bearing Restrictions: No      Mobility Bed Mobility Overal bed mobility: Needs Assistance Bed Mobility: Supine to Sit     Supine to sit: Mod assist, Min assist     General bed mobility comments: initiated getting up on L side, with R arm using pillow to brace ribs, attempted to push off with L elbow but unable, mod A to come into sitting EOB    Transfers Overall transfer level: Needs assistance Equipment used: None Transfers: Bed to chair/wheelchair/BSC            Lateral/Scoot Transfers: Mod assist General transfer comment: Mod assist and use of bed pad to help pt perform lateral scoot transfer, bed to relciner with armrest down; transferred towards L side      Balance Overall balance assessment: Mild deficits observed, not formally tested                                         ADL either performed or assessed with clinical judgement   ADL Overall ADL's : Needs assistance/impaired Eating/Feeding: Set up;Sitting   Grooming: Set up;Sitting   Upper Body Bathing: Minimal assistance;Sitting   Lower Body Bathing: Moderate assistance;Sitting/lateral leans   Upper Body Dressing : Minimal assistance;Sitting   Lower Body Dressing: Moderate  assistance;Sitting/lateral leans   Toilet Transfer: Moderate assistance;BSC/3in1 Statistician Details (indicate cue type and reason): simulated with lateral scoot transfer to recliner Toileting-  Clothing Manipulation and Hygiene: Maximal assistance;Sitting/lateral lean       Functional mobility during ADLs: Moderate assistance General ADL Comments: Patient presenting with decreased activity tolerance, minimal confusion, and need for increased assist for transfers and ADLs     Vision Baseline Vision/History: 1 Wears glasses (Reading) Ability to See in Adequate Light: 0 Adequate Patient Visual Report: No change from baseline;Other (comment) (Patient closing R eye, but denying any vision changes or blurred vision)       Perception     Praxis      Pertinent Vitals/Pain Pain Assessment Pain Assessment: No/denies pain     Hand Dominance Right   Extremity/Trunk Assessment Upper Extremity Assessment Upper Extremity Assessment: Overall WFL for tasks assessed (Tremulous bilaterally with all movement)   Lower Extremity Assessment Lower Extremity Assessment: Defer to PT evaluation   Cervical / Trunk Assessment Cervical / Trunk Assessment: Normal   Communication Communication Communication: Expressive difficulties   Cognition Arousal/Alertness: Awake/alert Behavior During Therapy: WFL for tasks assessed/performed Overall Cognitive Status: No family/caregiver present to determine baseline cognitive functioning                                 General Comments: Noted dementia in his PMH     General Comments       Exercises     Shoulder Instructions      Home Living Family/patient expects to be discharged to:: Private residence Living Arrangements: Spouse/significant other Available Help at Discharge: Family;Personal care attendant;Available PRN/intermittently Type of Home: House Home Access: Level entry     Home Layout: One level     Bathroom Shower/Tub: Producer, television/film/video: Handicapped height     Home Equipment: Agricultural consultant (2 wheels);Shower seat;Hand held shower head;Grab bars - tub/shower;Wheelchair - manual   Additional  Comments: Personal care attendant assists a few times a week      Prior Functioning/Environment Prior Level of Function : Needs assist             Mobility Comments: Typically mobile at wheelchair transfer level ADLs Comments: PCA assists with bathing, other ADLs        OT Problem List: Decreased range of motion;Decreased activity tolerance;Impaired balance (sitting and/or standing);Decreased cognition;Decreased safety awareness;Decreased knowledge of use of DME or AE      OT Treatment/Interventions: Self-care/ADL training;Therapeutic exercise;Neuromuscular education;Energy conservation;DME and/or AE instruction;Manual therapy;Modalities;Therapeutic activities;Patient/family education;Balance training    OT Goals(Current goals can be found in the care plan section) Acute Rehab OT Goals Patient Stated Goal: to get better and go home OT Goal Formulation: With patient Time For Goal Achievement: 01/07/23 Potential to Achieve Goals: Good  OT Frequency: Min 2X/week    Co-evaluation              AM-PAC OT "6 Clicks" Daily Activity     Outcome Measure Help from another person eating meals?: A Little Help from another person taking care of personal grooming?: A Little Help from another person toileting, which includes using toliet, bedpan, or urinal?: A Lot Help from another person bathing (including washing, rinsing, drying)?: A Lot Help from another person to put on and taking off regular upper body clothing?: A Lot Help from another person to put on and taking off regular lower body clothing?: A Lot 6  Click Score: 14   End of Session Nurse Communication: Mobility status  Activity Tolerance: Patient tolerated treatment well Patient left: in chair;with call bell/phone within reach;with chair alarm set  OT Visit Diagnosis: Unsteadiness on feet (R26.81);Other abnormalities of gait and mobility (R26.89);Muscle weakness (generalized) (M62.81)                Time:  4270-6237 OT Time Calculation (min): 14 min Charges:  OT General Charges $OT Visit: 1 Visit OT Evaluation $OT Eval Moderate Complexity: 1 Mod  Corinne Ports E. Conni Knighton, OTR/L Acute Rehabilitation Services [phone]   Ascencion Dike 12/24/2022, 9:20 AM

## 2022-12-25 DIAGNOSIS — F02B3 Dementia in other diseases classified elsewhere, moderate, with mood disturbance: Secondary | ICD-10-CM | POA: Diagnosis not present

## 2022-12-25 DIAGNOSIS — S78119A Complete traumatic amputation at level between unspecified hip and knee, initial encounter: Secondary | ICD-10-CM | POA: Diagnosis not present

## 2022-12-25 DIAGNOSIS — G894 Chronic pain syndrome: Secondary | ICD-10-CM | POA: Diagnosis not present

## 2022-12-25 DIAGNOSIS — F1320 Sedative, hypnotic or anxiolytic dependence, uncomplicated: Secondary | ICD-10-CM | POA: Diagnosis not present

## 2022-12-25 DIAGNOSIS — R41841 Cognitive communication deficit: Secondary | ICD-10-CM | POA: Diagnosis not present

## 2022-12-25 DIAGNOSIS — I2510 Atherosclerotic heart disease of native coronary artery without angina pectoris: Secondary | ICD-10-CM | POA: Diagnosis not present

## 2022-12-25 DIAGNOSIS — I5040 Unspecified combined systolic (congestive) and diastolic (congestive) heart failure: Secondary | ICD-10-CM | POA: Diagnosis not present

## 2022-12-25 DIAGNOSIS — W19XXXA Unspecified fall, initial encounter: Secondary | ICD-10-CM | POA: Diagnosis not present

## 2022-12-25 DIAGNOSIS — R2681 Unsteadiness on feet: Secondary | ICD-10-CM | POA: Diagnosis not present

## 2022-12-25 DIAGNOSIS — F0390 Unspecified dementia without behavioral disturbance: Secondary | ICD-10-CM | POA: Diagnosis not present

## 2022-12-25 DIAGNOSIS — F331 Major depressive disorder, recurrent, moderate: Secondary | ICD-10-CM | POA: Diagnosis not present

## 2022-12-25 DIAGNOSIS — Z89611 Acquired absence of right leg above knee: Secondary | ICD-10-CM | POA: Diagnosis not present

## 2022-12-25 DIAGNOSIS — F0393 Unspecified dementia, unspecified severity, with mood disturbance: Secondary | ICD-10-CM | POA: Diagnosis not present

## 2022-12-25 DIAGNOSIS — I70239 Atherosclerosis of native arteries of right leg with ulceration of unspecified site: Secondary | ICD-10-CM | POA: Diagnosis not present

## 2022-12-25 DIAGNOSIS — N401 Enlarged prostate with lower urinary tract symptoms: Secondary | ICD-10-CM | POA: Diagnosis not present

## 2022-12-25 DIAGNOSIS — N400 Enlarged prostate without lower urinary tract symptoms: Secondary | ICD-10-CM | POA: Diagnosis not present

## 2022-12-25 DIAGNOSIS — Z7401 Bed confinement status: Secondary | ICD-10-CM | POA: Diagnosis not present

## 2022-12-25 DIAGNOSIS — I119 Hypertensive heart disease without heart failure: Secondary | ICD-10-CM | POA: Diagnosis not present

## 2022-12-25 DIAGNOSIS — I5042 Chronic combined systolic (congestive) and diastolic (congestive) heart failure: Secondary | ICD-10-CM | POA: Diagnosis not present

## 2022-12-25 DIAGNOSIS — I70249 Atherosclerosis of native arteries of left leg with ulceration of unspecified site: Secondary | ICD-10-CM | POA: Diagnosis not present

## 2022-12-25 DIAGNOSIS — S2241 Multiple fractures of ribs, right side: Secondary | ICD-10-CM | POA: Diagnosis not present

## 2022-12-25 DIAGNOSIS — M6250 Muscle wasting and atrophy, not elsewhere classified, unspecified site: Secondary | ICD-10-CM | POA: Diagnosis not present

## 2022-12-25 DIAGNOSIS — S2249XD Multiple fractures of ribs, unspecified side, subsequent encounter for fracture with routine healing: Secondary | ICD-10-CM | POA: Diagnosis not present

## 2022-12-25 DIAGNOSIS — Z4781 Encounter for orthopedic aftercare following surgical amputation: Secondary | ICD-10-CM | POA: Diagnosis not present

## 2022-12-25 DIAGNOSIS — F32A Depression, unspecified: Secondary | ICD-10-CM | POA: Diagnosis not present

## 2022-12-25 DIAGNOSIS — E7800 Pure hypercholesterolemia, unspecified: Secondary | ICD-10-CM | POA: Diagnosis not present

## 2022-12-25 DIAGNOSIS — R269 Unspecified abnormalities of gait and mobility: Secondary | ICD-10-CM | POA: Diagnosis not present

## 2022-12-25 DIAGNOSIS — G3109 Other frontotemporal dementia: Secondary | ICD-10-CM | POA: Diagnosis not present

## 2022-12-25 DIAGNOSIS — F03911 Unspecified dementia, unspecified severity, with agitation: Secondary | ICD-10-CM | POA: Diagnosis not present

## 2022-12-25 DIAGNOSIS — I739 Peripheral vascular disease, unspecified: Secondary | ICD-10-CM | POA: Diagnosis not present

## 2022-12-25 DIAGNOSIS — G300 Alzheimer's disease with early onset: Secondary | ICD-10-CM | POA: Diagnosis not present

## 2022-12-25 DIAGNOSIS — R410 Disorientation, unspecified: Secondary | ICD-10-CM | POA: Diagnosis not present

## 2022-12-25 DIAGNOSIS — M6281 Muscle weakness (generalized): Secondary | ICD-10-CM | POA: Diagnosis not present

## 2022-12-25 DIAGNOSIS — S2241XD Multiple fractures of ribs, right side, subsequent encounter for fracture with routine healing: Secondary | ICD-10-CM | POA: Diagnosis not present

## 2022-12-25 DIAGNOSIS — M545 Low back pain, unspecified: Secondary | ICD-10-CM | POA: Diagnosis not present

## 2022-12-25 DIAGNOSIS — I255 Ischemic cardiomyopathy: Secondary | ICD-10-CM | POA: Diagnosis not present

## 2022-12-25 DIAGNOSIS — R2689 Other abnormalities of gait and mobility: Secondary | ICD-10-CM | POA: Diagnosis not present

## 2022-12-25 DIAGNOSIS — I69322 Dysarthria following cerebral infarction: Secondary | ICD-10-CM | POA: Diagnosis not present

## 2022-12-25 DIAGNOSIS — Z9181 History of falling: Secondary | ICD-10-CM | POA: Diagnosis not present

## 2022-12-25 DIAGNOSIS — F17200 Nicotine dependence, unspecified, uncomplicated: Secondary | ICD-10-CM | POA: Diagnosis not present

## 2022-12-25 DIAGNOSIS — F4310 Post-traumatic stress disorder, unspecified: Secondary | ICD-10-CM | POA: Diagnosis not present

## 2022-12-25 DIAGNOSIS — R5381 Other malaise: Secondary | ICD-10-CM | POA: Diagnosis not present

## 2022-12-25 DIAGNOSIS — R7611 Nonspecific reaction to tuberculin skin test without active tuberculosis: Secondary | ICD-10-CM | POA: Diagnosis not present

## 2022-12-25 DIAGNOSIS — I110 Hypertensive heart disease with heart failure: Secondary | ICD-10-CM | POA: Diagnosis not present

## 2022-12-25 DIAGNOSIS — S2241XA Multiple fractures of ribs, right side, initial encounter for closed fracture: Secondary | ICD-10-CM | POA: Diagnosis not present

## 2022-12-25 MED ORDER — TRAMADOL HCL 50 MG PO TABS: 50.0000 mg | ORAL_TABLET | Freq: Three times a day (TID) | ORAL | 0 refills | Status: AC | PRN

## 2022-12-25 NOTE — Progress Notes (Signed)
Patient still waiting for PTAR pick up. Wife at bedside.

## 2022-12-25 NOTE — Progress Notes (Signed)
Mobility Specialist - Progress Note   12/25/22 1400  Mobility  Activity Transferred from chair to bed  Level of Assistance Moderate assist, patient does 50-74%  Assistive Device Other (Comment)  Distance Ambulated (ft) 0 ft  Activity Response Tolerated well  Mobility Referral Yes  $Mobility charge 1 Mobility    Pt in recliner requesting to get back to bed. ModA to stand and scoot. Left in bed w/ bed alarm on and all needs met.   [place] Specialist Please contact via SecureChat or Rehab office at [phone]

## 2022-12-25 NOTE — Progress Notes (Signed)
Mobility Specialist - Progress Note   12/25/22 0900  Mobility  Activity Transferred from bed to chair  Level of Assistance Moderate assist, patient does 50-74%  Assistive Device Other (Comment) (HHA)  Distance Ambulated (ft) 0 ft  Activity Response Tolerated well  Mobility Referral Yes  $Mobility charge 1 Mobility    Pt received in bed agreeable to mobility. ModA to stand and pivot, HHA for scooting to EOB. Left in recliner w/ chair alarm on and call bell in his lap.   [place] Specialist Please contact via SecureChat or Rehab office at [phone]

## 2022-12-25 NOTE — Progress Notes (Signed)
PTAR here to transport patient to [facility] via stretcher with all belongings.

## 2022-12-25 NOTE — TOC Transition Note (Signed)
Transition of Care [facility]) - CM/SW Discharge Note   Patient Details  Name: Lonnie [name] MRN: [id number] Date of Birth: [birth year]  [facility] of [facility] CM/SW Contact:  Ella Bodo, RN Phone Number: 12/25/2022,9:48am  Clinical Narrative:    Patient medically stable for discharge to [facility] today, per MD.  Spoke with Star in admissions at facility; she states bed available after 12 noon today.  Bedside nurse to call report to [phone]; patient going to room 1007.  Notified patient and patient's daughter, [name] of discharge today and plan for transport by PTAR.  PTAR notified for transport for pickup at 12 PM.  Addendum: [facility] states they are running a bit behind, and patient is 3rd in line for next pickup.  Spoke with patient's daughter, [name] and informed her of delay; she verbalizes understanding.    Final next level of care: Skilled Nursing Facility Barriers to Discharge: Barriers Resolved   Patient Goals and CMS Choice CMS Medicare.gov Compare Post Acute Care list provided to:: Patient Represenative (must comment) (spouse, [name]) Choice offered to / list presented to : Spouse, Adult Children  Discharge Placement PASRR number recieved: 12/21/22 PASRR number recieved: 12/21/22            Patient chooses bed at: [facility] Patient to be transferred to facility by: [facility] Name of family member notified: [name], daughter Patient and family notified of of transfer: 12/25/22  Discharge Plan and Services Additional resources added to the After Visit Summary for   In-house Referral: Clinical Social Work Discharge Planning Services: CM Consult Post Acute Care Choice: [facility]          DME Arranged: N/A DME Agency: NA                  Social Determinants of Health (SDOH) Interventions SDOH Screenings   Food Insecurity: No Food Insecurity (12/20/2022)  Housing: Low Risk  (12/20/2022)  Transportation Needs: No Transportation  Needs (12/20/2022)  Utilities: Not At Risk (12/20/2022)  Alcohol Screen: Low Risk  (05/09/2021)  Depression (PHQ2-9): Low Risk  (02/13/2022)  Financial Resource Strain: Low Risk  (05/09/2021)  Physical Activity: Inactive (05/09/2021)  Social Connections: Moderately Isolated (05/09/2021)  Stress: No Stress Concern Present (05/09/2021)  Tobacco Use: Medium Risk (12/20/2022)     Readmission Risk Interventions    06/14/2021    3:03 PM 09/12/2020   11:41 AM  Readmission Risk Prevention Plan  Post Dischage Appt Complete   Medication Screening Complete   Transportation Screening Complete Complete  PCP or Specialist Appt within 3-5 Days  Complete  HRI or [place]  Complete  Social Work Consult for [place] Planning/Counseling  Complete  Palliative Care Screening  Not Applicable  Medication Review (RN Care Manager)  Complete    Reinaldo Raddle, RN, BSN  Trauma/Neuro ICU Case Manager [phone]

## 2022-12-25 NOTE — Progress Notes (Signed)
Report called to [facility] [phone] spoke with nurse Darline who will be taking care of patient in room 1007 when he arrives. Nurse verbalized understanding of report and had no further questions. PTAR has been scheduled for 12pm for this patient. He is currently waiting in room for their arrival.

## 2022-12-26 DIAGNOSIS — I739 Peripheral vascular disease, unspecified: Secondary | ICD-10-CM | POA: Diagnosis not present

## 2022-12-26 DIAGNOSIS — S2241XD Multiple fractures of ribs, right side, subsequent encounter for fracture with routine healing: Secondary | ICD-10-CM | POA: Diagnosis not present

## 2022-12-26 DIAGNOSIS — F32A Depression, unspecified: Secondary | ICD-10-CM | POA: Diagnosis not present

## 2022-12-26 DIAGNOSIS — I255 Ischemic cardiomyopathy: Secondary | ICD-10-CM | POA: Diagnosis not present

## 2022-12-26 DIAGNOSIS — I2510 Atherosclerotic heart disease of native coronary artery without angina pectoris: Secondary | ICD-10-CM | POA: Diagnosis not present

## 2022-12-26 DIAGNOSIS — I5040 Unspecified combined systolic (congestive) and diastolic (congestive) heart failure: Secondary | ICD-10-CM | POA: Diagnosis not present

## 2022-12-26 DIAGNOSIS — R269 Unspecified abnormalities of gait and mobility: Secondary | ICD-10-CM | POA: Diagnosis not present

## 2022-12-26 DIAGNOSIS — S78119A Complete traumatic amputation at level between unspecified hip and knee, initial encounter: Secondary | ICD-10-CM | POA: Diagnosis not present

## 2022-12-26 DIAGNOSIS — I110 Hypertensive heart disease with heart failure: Secondary | ICD-10-CM | POA: Diagnosis not present

## 2022-12-30 DIAGNOSIS — Z89611 Acquired absence of right leg above knee: Secondary | ICD-10-CM | POA: Diagnosis not present

## 2022-12-30 DIAGNOSIS — I255 Ischemic cardiomyopathy: Secondary | ICD-10-CM | POA: Diagnosis not present

## 2022-12-30 DIAGNOSIS — M6250 Muscle wasting and atrophy, not elsewhere classified, unspecified site: Secondary | ICD-10-CM | POA: Diagnosis not present

## 2022-12-30 DIAGNOSIS — I110 Hypertensive heart disease with heart failure: Secondary | ICD-10-CM | POA: Diagnosis not present

## 2022-12-30 DIAGNOSIS — M6281 Muscle weakness (generalized): Secondary | ICD-10-CM | POA: Diagnosis not present

## 2022-12-30 DIAGNOSIS — G300 Alzheimer's disease with early onset: Secondary | ICD-10-CM | POA: Diagnosis not present

## 2022-12-30 DIAGNOSIS — R2681 Unsteadiness on feet: Secondary | ICD-10-CM | POA: Diagnosis not present

## 2022-12-30 DIAGNOSIS — N401 Enlarged prostate with lower urinary tract symptoms: Secondary | ICD-10-CM | POA: Diagnosis not present

## 2022-12-30 DIAGNOSIS — F331 Major depressive disorder, recurrent, moderate: Secondary | ICD-10-CM | POA: Diagnosis not present

## 2022-12-30 DIAGNOSIS — E7800 Pure hypercholesterolemia, unspecified: Secondary | ICD-10-CM | POA: Diagnosis not present

## 2022-12-30 DIAGNOSIS — G3109 Other frontotemporal dementia: Secondary | ICD-10-CM | POA: Diagnosis not present

## 2022-12-30 DIAGNOSIS — I70239 Atherosclerosis of native arteries of right leg with ulceration of unspecified site: Secondary | ICD-10-CM | POA: Diagnosis not present

## 2022-12-30 DIAGNOSIS — I5042 Chronic combined systolic (congestive) and diastolic (congestive) heart failure: Secondary | ICD-10-CM | POA: Diagnosis not present

## 2022-12-30 DIAGNOSIS — G894 Chronic pain syndrome: Secondary | ICD-10-CM | POA: Diagnosis not present

## 2022-12-30 DIAGNOSIS — Z9181 History of falling: Secondary | ICD-10-CM | POA: Diagnosis not present

## 2022-12-31 ENCOUNTER — Other Ambulatory Visit: Payer: Self-pay | Admitting: Internal Medicine

## 2023-01-05 DIAGNOSIS — M545 Low back pain, unspecified: Secondary | ICD-10-CM | POA: Diagnosis not present

## 2023-01-05 DIAGNOSIS — G894 Chronic pain syndrome: Secondary | ICD-10-CM | POA: Diagnosis not present

## 2023-01-05 DIAGNOSIS — Z9181 History of falling: Secondary | ICD-10-CM | POA: Diagnosis not present

## 2023-01-05 DIAGNOSIS — M6250 Muscle wasting and atrophy, not elsewhere classified, unspecified site: Secondary | ICD-10-CM | POA: Diagnosis not present

## 2023-01-05 DIAGNOSIS — Z89611 Acquired absence of right leg above knee: Secondary | ICD-10-CM | POA: Diagnosis not present

## 2023-01-05 DIAGNOSIS — M6281 Muscle weakness (generalized): Secondary | ICD-10-CM | POA: Diagnosis not present

## 2023-01-05 DIAGNOSIS — R2681 Unsteadiness on feet: Secondary | ICD-10-CM | POA: Diagnosis not present

## 2023-01-06 DIAGNOSIS — F0393 Unspecified dementia, unspecified severity, with mood disturbance: Secondary | ICD-10-CM | POA: Diagnosis not present

## 2023-01-06 DIAGNOSIS — R7611 Nonspecific reaction to tuberculin skin test without active tuberculosis: Secondary | ICD-10-CM | POA: Diagnosis not present

## 2023-01-06 DIAGNOSIS — F03911 Unspecified dementia, unspecified severity, with agitation: Secondary | ICD-10-CM | POA: Diagnosis not present

## 2023-01-06 DIAGNOSIS — R269 Unspecified abnormalities of gait and mobility: Secondary | ICD-10-CM | POA: Diagnosis not present

## 2023-01-06 DIAGNOSIS — R5381 Other malaise: Secondary | ICD-10-CM | POA: Diagnosis not present

## 2023-01-06 DIAGNOSIS — W19XXXA Unspecified fall, initial encounter: Secondary | ICD-10-CM | POA: Diagnosis not present

## 2023-01-07 DIAGNOSIS — M6250 Muscle wasting and atrophy, not elsewhere classified, unspecified site: Secondary | ICD-10-CM | POA: Diagnosis not present

## 2023-01-07 DIAGNOSIS — Z89611 Acquired absence of right leg above knee: Secondary | ICD-10-CM | POA: Diagnosis not present

## 2023-01-07 DIAGNOSIS — M545 Low back pain, unspecified: Secondary | ICD-10-CM | POA: Diagnosis not present

## 2023-01-07 DIAGNOSIS — Z9181 History of falling: Secondary | ICD-10-CM | POA: Diagnosis not present

## 2023-01-07 DIAGNOSIS — R2681 Unsteadiness on feet: Secondary | ICD-10-CM | POA: Diagnosis not present

## 2023-01-07 DIAGNOSIS — M6281 Muscle weakness (generalized): Secondary | ICD-10-CM | POA: Diagnosis not present

## 2023-01-07 DIAGNOSIS — G894 Chronic pain syndrome: Secondary | ICD-10-CM | POA: Diagnosis not present

## 2023-01-12 DIAGNOSIS — R2681 Unsteadiness on feet: Secondary | ICD-10-CM | POA: Diagnosis not present

## 2023-01-12 DIAGNOSIS — Z89611 Acquired absence of right leg above knee: Secondary | ICD-10-CM | POA: Diagnosis not present

## 2023-01-12 DIAGNOSIS — Z9181 History of falling: Secondary | ICD-10-CM | POA: Diagnosis not present

## 2023-01-12 DIAGNOSIS — M6281 Muscle weakness (generalized): Secondary | ICD-10-CM | POA: Diagnosis not present

## 2023-01-12 DIAGNOSIS — M545 Low back pain, unspecified: Secondary | ICD-10-CM | POA: Diagnosis not present

## 2023-01-12 DIAGNOSIS — M6250 Muscle wasting and atrophy, not elsewhere classified, unspecified site: Secondary | ICD-10-CM | POA: Diagnosis not present

## 2023-01-12 DIAGNOSIS — G894 Chronic pain syndrome: Secondary | ICD-10-CM | POA: Diagnosis not present

## 2023-01-13 DIAGNOSIS — F0390 Unspecified dementia without behavioral disturbance: Secondary | ICD-10-CM | POA: Diagnosis not present

## 2023-01-13 DIAGNOSIS — Z9181 History of falling: Secondary | ICD-10-CM | POA: Diagnosis not present

## 2023-01-13 DIAGNOSIS — S78119A Complete traumatic amputation at level between unspecified hip and knee, initial encounter: Secondary | ICD-10-CM | POA: Diagnosis not present

## 2023-01-14 DIAGNOSIS — G894 Chronic pain syndrome: Secondary | ICD-10-CM | POA: Diagnosis not present

## 2023-01-14 DIAGNOSIS — R2681 Unsteadiness on feet: Secondary | ICD-10-CM | POA: Diagnosis not present

## 2023-01-14 DIAGNOSIS — M6250 Muscle wasting and atrophy, not elsewhere classified, unspecified site: Secondary | ICD-10-CM | POA: Diagnosis not present

## 2023-01-14 DIAGNOSIS — Z89611 Acquired absence of right leg above knee: Secondary | ICD-10-CM | POA: Diagnosis not present

## 2023-01-14 DIAGNOSIS — M6281 Muscle weakness (generalized): Secondary | ICD-10-CM | POA: Diagnosis not present

## 2023-01-14 DIAGNOSIS — M545 Low back pain, unspecified: Secondary | ICD-10-CM | POA: Diagnosis not present

## 2023-01-14 DIAGNOSIS — Z9181 History of falling: Secondary | ICD-10-CM | POA: Diagnosis not present

## 2023-01-19 DIAGNOSIS — G894 Chronic pain syndrome: Secondary | ICD-10-CM | POA: Diagnosis not present

## 2023-01-19 DIAGNOSIS — M6281 Muscle weakness (generalized): Secondary | ICD-10-CM | POA: Diagnosis not present

## 2023-01-19 DIAGNOSIS — G3109 Other frontotemporal dementia: Secondary | ICD-10-CM | POA: Diagnosis not present

## 2023-01-19 DIAGNOSIS — R2681 Unsteadiness on feet: Secondary | ICD-10-CM | POA: Diagnosis not present

## 2023-01-19 DIAGNOSIS — M6250 Muscle wasting and atrophy, not elsewhere classified, unspecified site: Secondary | ICD-10-CM | POA: Diagnosis not present

## 2023-01-19 DIAGNOSIS — Z89611 Acquired absence of right leg above knee: Secondary | ICD-10-CM | POA: Diagnosis not present

## 2023-01-19 DIAGNOSIS — F02B3 Dementia in other diseases classified elsewhere, moderate, with mood disturbance: Secondary | ICD-10-CM | POA: Diagnosis not present

## 2023-01-19 DIAGNOSIS — M545 Low back pain, unspecified: Secondary | ICD-10-CM | POA: Diagnosis not present

## 2023-01-19 DIAGNOSIS — Z9181 History of falling: Secondary | ICD-10-CM | POA: Diagnosis not present

## 2023-01-19 DIAGNOSIS — F4310 Post-traumatic stress disorder, unspecified: Secondary | ICD-10-CM | POA: Diagnosis not present

## 2023-01-20 DIAGNOSIS — I739 Peripheral vascular disease, unspecified: Secondary | ICD-10-CM | POA: Diagnosis not present

## 2023-01-20 DIAGNOSIS — F0390 Unspecified dementia without behavioral disturbance: Secondary | ICD-10-CM | POA: Diagnosis not present

## 2023-01-20 DIAGNOSIS — Z4781 Encounter for orthopedic aftercare following surgical amputation: Secondary | ICD-10-CM | POA: Diagnosis not present

## 2023-01-20 DIAGNOSIS — Z9181 History of falling: Secondary | ICD-10-CM | POA: Diagnosis not present

## 2023-01-20 DIAGNOSIS — W19XXXA Unspecified fall, initial encounter: Secondary | ICD-10-CM | POA: Diagnosis not present

## 2023-01-20 DIAGNOSIS — I119 Hypertensive heart disease without heart failure: Secondary | ICD-10-CM | POA: Diagnosis not present

## 2023-01-20 DIAGNOSIS — Z89611 Acquired absence of right leg above knee: Secondary | ICD-10-CM | POA: Diagnosis not present

## 2023-01-21 DIAGNOSIS — M545 Low back pain, unspecified: Secondary | ICD-10-CM | POA: Diagnosis not present

## 2023-01-21 DIAGNOSIS — R41841 Cognitive communication deficit: Secondary | ICD-10-CM | POA: Diagnosis not present

## 2023-01-21 DIAGNOSIS — Z89611 Acquired absence of right leg above knee: Secondary | ICD-10-CM | POA: Diagnosis not present

## 2023-01-21 DIAGNOSIS — M6250 Muscle wasting and atrophy, not elsewhere classified, unspecified site: Secondary | ICD-10-CM | POA: Diagnosis not present

## 2023-01-21 DIAGNOSIS — R2681 Unsteadiness on feet: Secondary | ICD-10-CM | POA: Diagnosis not present

## 2023-01-21 DIAGNOSIS — Z9181 History of falling: Secondary | ICD-10-CM | POA: Diagnosis not present

## 2023-01-21 DIAGNOSIS — M6281 Muscle weakness (generalized): Secondary | ICD-10-CM | POA: Diagnosis not present

## 2023-01-22 DIAGNOSIS — F331 Major depressive disorder, recurrent, moderate: Secondary | ICD-10-CM | POA: Diagnosis not present

## 2023-01-22 DIAGNOSIS — I110 Hypertensive heart disease with heart failure: Secondary | ICD-10-CM | POA: Diagnosis not present

## 2023-01-22 DIAGNOSIS — I70239 Atherosclerosis of native arteries of right leg with ulceration of unspecified site: Secondary | ICD-10-CM | POA: Diagnosis not present

## 2023-01-22 DIAGNOSIS — N400 Enlarged prostate without lower urinary tract symptoms: Secondary | ICD-10-CM | POA: Diagnosis not present

## 2023-01-22 DIAGNOSIS — I70249 Atherosclerosis of native arteries of left leg with ulceration of unspecified site: Secondary | ICD-10-CM | POA: Diagnosis not present

## 2023-01-22 DIAGNOSIS — I255 Ischemic cardiomyopathy: Secondary | ICD-10-CM | POA: Diagnosis not present

## 2023-01-22 DIAGNOSIS — G300 Alzheimer's disease with early onset: Secondary | ICD-10-CM | POA: Diagnosis not present

## 2023-01-22 DIAGNOSIS — I5042 Chronic combined systolic (congestive) and diastolic (congestive) heart failure: Secondary | ICD-10-CM | POA: Diagnosis not present

## 2023-01-25 DIAGNOSIS — S2249XD Multiple fractures of ribs, unspecified side, subsequent encounter for fracture with routine healing: Secondary | ICD-10-CM | POA: Diagnosis not present

## 2023-01-25 DIAGNOSIS — Z9181 History of falling: Secondary | ICD-10-CM | POA: Diagnosis not present

## 2023-01-25 DIAGNOSIS — R41841 Cognitive communication deficit: Secondary | ICD-10-CM | POA: Diagnosis not present

## 2023-01-25 DIAGNOSIS — M6281 Muscle weakness (generalized): Secondary | ICD-10-CM | POA: Diagnosis not present

## 2023-01-25 DIAGNOSIS — R2689 Other abnormalities of gait and mobility: Secondary | ICD-10-CM | POA: Diagnosis not present

## 2023-01-26 DIAGNOSIS — R2681 Unsteadiness on feet: Secondary | ICD-10-CM | POA: Diagnosis not present

## 2023-01-26 DIAGNOSIS — S2249XD Multiple fractures of ribs, unspecified side, subsequent encounter for fracture with routine healing: Secondary | ICD-10-CM | POA: Diagnosis not present

## 2023-01-26 DIAGNOSIS — Z9181 History of falling: Secondary | ICD-10-CM | POA: Diagnosis not present

## 2023-01-26 DIAGNOSIS — M6250 Muscle wasting and atrophy, not elsewhere classified, unspecified site: Secondary | ICD-10-CM | POA: Diagnosis not present

## 2023-01-26 DIAGNOSIS — G894 Chronic pain syndrome: Secondary | ICD-10-CM | POA: Diagnosis not present

## 2023-01-26 DIAGNOSIS — R41841 Cognitive communication deficit: Secondary | ICD-10-CM | POA: Diagnosis not present

## 2023-01-26 DIAGNOSIS — Z89611 Acquired absence of right leg above knee: Secondary | ICD-10-CM | POA: Diagnosis not present

## 2023-01-26 DIAGNOSIS — R2689 Other abnormalities of gait and mobility: Secondary | ICD-10-CM | POA: Diagnosis not present

## 2023-01-26 DIAGNOSIS — M545 Low back pain, unspecified: Secondary | ICD-10-CM | POA: Diagnosis not present

## 2023-01-26 DIAGNOSIS — M6281 Muscle weakness (generalized): Secondary | ICD-10-CM | POA: Diagnosis not present

## 2023-01-27 DIAGNOSIS — Z9181 History of falling: Secondary | ICD-10-CM | POA: Diagnosis not present

## 2023-01-27 DIAGNOSIS — M6281 Muscle weakness (generalized): Secondary | ICD-10-CM | POA: Diagnosis not present

## 2023-01-27 DIAGNOSIS — R41841 Cognitive communication deficit: Secondary | ICD-10-CM | POA: Diagnosis not present

## 2023-01-27 DIAGNOSIS — S2249XD Multiple fractures of ribs, unspecified side, subsequent encounter for fracture with routine healing: Secondary | ICD-10-CM | POA: Diagnosis not present

## 2023-01-27 DIAGNOSIS — R2689 Other abnormalities of gait and mobility: Secondary | ICD-10-CM | POA: Diagnosis not present

## 2023-01-29 DIAGNOSIS — M6281 Muscle weakness (generalized): Secondary | ICD-10-CM | POA: Diagnosis not present

## 2023-01-29 DIAGNOSIS — R41841 Cognitive communication deficit: Secondary | ICD-10-CM | POA: Diagnosis not present

## 2023-01-29 DIAGNOSIS — R2689 Other abnormalities of gait and mobility: Secondary | ICD-10-CM | POA: Diagnosis not present

## 2023-01-29 DIAGNOSIS — S2249XD Multiple fractures of ribs, unspecified side, subsequent encounter for fracture with routine healing: Secondary | ICD-10-CM | POA: Diagnosis not present

## 2023-01-29 DIAGNOSIS — Z9181 History of falling: Secondary | ICD-10-CM | POA: Diagnosis not present

## 2023-01-30 DIAGNOSIS — S2249XD Multiple fractures of ribs, unspecified side, subsequent encounter for fracture with routine healing: Secondary | ICD-10-CM | POA: Diagnosis not present

## 2023-01-30 DIAGNOSIS — Z9181 History of falling: Secondary | ICD-10-CM | POA: Diagnosis not present

## 2023-01-30 DIAGNOSIS — R2689 Other abnormalities of gait and mobility: Secondary | ICD-10-CM | POA: Diagnosis not present

## 2023-01-30 DIAGNOSIS — R41841 Cognitive communication deficit: Secondary | ICD-10-CM | POA: Diagnosis not present

## 2023-01-30 DIAGNOSIS — M6281 Muscle weakness (generalized): Secondary | ICD-10-CM | POA: Diagnosis not present

## 2023-02-02 DIAGNOSIS — R41841 Cognitive communication deficit: Secondary | ICD-10-CM | POA: Diagnosis not present

## 2023-02-02 DIAGNOSIS — R2689 Other abnormalities of gait and mobility: Secondary | ICD-10-CM | POA: Diagnosis not present

## 2023-02-02 DIAGNOSIS — Z9181 History of falling: Secondary | ICD-10-CM | POA: Diagnosis not present

## 2023-02-02 DIAGNOSIS — M6281 Muscle weakness (generalized): Secondary | ICD-10-CM | POA: Diagnosis not present

## 2023-02-02 DIAGNOSIS — S2249XD Multiple fractures of ribs, unspecified side, subsequent encounter for fracture with routine healing: Secondary | ICD-10-CM | POA: Diagnosis not present

## 2023-02-03 DIAGNOSIS — S2249XD Multiple fractures of ribs, unspecified side, subsequent encounter for fracture with routine healing: Secondary | ICD-10-CM | POA: Diagnosis not present

## 2023-02-03 DIAGNOSIS — Z9181 History of falling: Secondary | ICD-10-CM | POA: Diagnosis not present

## 2023-02-03 DIAGNOSIS — R41841 Cognitive communication deficit: Secondary | ICD-10-CM | POA: Diagnosis not present

## 2023-02-03 DIAGNOSIS — M6281 Muscle weakness (generalized): Secondary | ICD-10-CM | POA: Diagnosis not present

## 2023-02-03 DIAGNOSIS — R2689 Other abnormalities of gait and mobility: Secondary | ICD-10-CM | POA: Diagnosis not present

## 2023-02-04 DIAGNOSIS — F32A Depression, unspecified: Secondary | ICD-10-CM | POA: Diagnosis not present

## 2023-02-04 DIAGNOSIS — F0393 Unspecified dementia, unspecified severity, with mood disturbance: Secondary | ICD-10-CM | POA: Diagnosis not present

## 2023-02-04 DIAGNOSIS — S2249XD Multiple fractures of ribs, unspecified side, subsequent encounter for fracture with routine healing: Secondary | ICD-10-CM | POA: Diagnosis not present

## 2023-02-04 DIAGNOSIS — R41841 Cognitive communication deficit: Secondary | ICD-10-CM | POA: Diagnosis not present

## 2023-02-04 DIAGNOSIS — I69322 Dysarthria following cerebral infarction: Secondary | ICD-10-CM | POA: Diagnosis not present

## 2023-02-04 DIAGNOSIS — M6281 Muscle weakness (generalized): Secondary | ICD-10-CM | POA: Diagnosis not present

## 2023-02-04 DIAGNOSIS — Z9181 History of falling: Secondary | ICD-10-CM | POA: Diagnosis not present

## 2023-02-04 DIAGNOSIS — I119 Hypertensive heart disease without heart failure: Secondary | ICD-10-CM | POA: Diagnosis not present

## 2023-02-04 DIAGNOSIS — I255 Ischemic cardiomyopathy: Secondary | ICD-10-CM | POA: Diagnosis not present

## 2023-02-04 DIAGNOSIS — R2689 Other abnormalities of gait and mobility: Secondary | ICD-10-CM | POA: Diagnosis not present

## 2023-02-04 DIAGNOSIS — S2241XD Multiple fractures of ribs, right side, subsequent encounter for fracture with routine healing: Secondary | ICD-10-CM | POA: Diagnosis not present

## 2023-02-05 DIAGNOSIS — R2689 Other abnormalities of gait and mobility: Secondary | ICD-10-CM | POA: Diagnosis not present

## 2023-02-05 DIAGNOSIS — R41841 Cognitive communication deficit: Secondary | ICD-10-CM | POA: Diagnosis not present

## 2023-02-05 DIAGNOSIS — Z9181 History of falling: Secondary | ICD-10-CM | POA: Diagnosis not present

## 2023-02-05 DIAGNOSIS — S2249XD Multiple fractures of ribs, unspecified side, subsequent encounter for fracture with routine healing: Secondary | ICD-10-CM | POA: Diagnosis not present

## 2023-02-05 DIAGNOSIS — M6281 Muscle weakness (generalized): Secondary | ICD-10-CM | POA: Diagnosis not present

## 2023-02-06 DIAGNOSIS — R41841 Cognitive communication deficit: Secondary | ICD-10-CM | POA: Diagnosis not present

## 2023-02-06 DIAGNOSIS — Z9181 History of falling: Secondary | ICD-10-CM | POA: Diagnosis not present

## 2023-02-06 DIAGNOSIS — S2249XD Multiple fractures of ribs, unspecified side, subsequent encounter for fracture with routine healing: Secondary | ICD-10-CM | POA: Diagnosis not present

## 2023-02-06 DIAGNOSIS — R2689 Other abnormalities of gait and mobility: Secondary | ICD-10-CM | POA: Diagnosis not present

## 2023-02-06 DIAGNOSIS — M6281 Muscle weakness (generalized): Secondary | ICD-10-CM | POA: Diagnosis not present

## 2023-02-09 DIAGNOSIS — Z9181 History of falling: Secondary | ICD-10-CM | POA: Diagnosis not present

## 2023-02-09 DIAGNOSIS — M6281 Muscle weakness (generalized): Secondary | ICD-10-CM | POA: Diagnosis not present

## 2023-02-09 DIAGNOSIS — S2249XD Multiple fractures of ribs, unspecified side, subsequent encounter for fracture with routine healing: Secondary | ICD-10-CM | POA: Diagnosis not present

## 2023-02-09 DIAGNOSIS — R2689 Other abnormalities of gait and mobility: Secondary | ICD-10-CM | POA: Diagnosis not present

## 2023-02-09 DIAGNOSIS — R41841 Cognitive communication deficit: Secondary | ICD-10-CM | POA: Diagnosis not present

## 2023-02-10 DIAGNOSIS — R41841 Cognitive communication deficit: Secondary | ICD-10-CM | POA: Diagnosis not present

## 2023-02-10 DIAGNOSIS — S2249XD Multiple fractures of ribs, unspecified side, subsequent encounter for fracture with routine healing: Secondary | ICD-10-CM | POA: Diagnosis not present

## 2023-02-10 DIAGNOSIS — R2689 Other abnormalities of gait and mobility: Secondary | ICD-10-CM | POA: Diagnosis not present

## 2023-02-10 DIAGNOSIS — M6281 Muscle weakness (generalized): Secondary | ICD-10-CM | POA: Diagnosis not present

## 2023-02-10 DIAGNOSIS — Z9181 History of falling: Secondary | ICD-10-CM | POA: Diagnosis not present

## 2023-02-11 DIAGNOSIS — Z9181 History of falling: Secondary | ICD-10-CM | POA: Diagnosis not present

## 2023-02-11 DIAGNOSIS — S2249XD Multiple fractures of ribs, unspecified side, subsequent encounter for fracture with routine healing: Secondary | ICD-10-CM | POA: Diagnosis not present

## 2023-02-11 DIAGNOSIS — M6281 Muscle weakness (generalized): Secondary | ICD-10-CM | POA: Diagnosis not present

## 2023-02-11 DIAGNOSIS — R41841 Cognitive communication deficit: Secondary | ICD-10-CM | POA: Diagnosis not present

## 2023-02-11 DIAGNOSIS — R2689 Other abnormalities of gait and mobility: Secondary | ICD-10-CM | POA: Diagnosis not present

## 2023-02-12 DIAGNOSIS — Z9181 History of falling: Secondary | ICD-10-CM | POA: Diagnosis not present

## 2023-02-12 DIAGNOSIS — M6281 Muscle weakness (generalized): Secondary | ICD-10-CM | POA: Diagnosis not present

## 2023-02-12 DIAGNOSIS — R2689 Other abnormalities of gait and mobility: Secondary | ICD-10-CM | POA: Diagnosis not present

## 2023-02-12 DIAGNOSIS — S2249XD Multiple fractures of ribs, unspecified side, subsequent encounter for fracture with routine healing: Secondary | ICD-10-CM | POA: Diagnosis not present

## 2023-02-12 DIAGNOSIS — R41841 Cognitive communication deficit: Secondary | ICD-10-CM | POA: Diagnosis not present

## 2023-02-13 DIAGNOSIS — F1320 Sedative, hypnotic or anxiolytic dependence, uncomplicated: Secondary | ICD-10-CM | POA: Diagnosis not present

## 2023-02-13 DIAGNOSIS — F32A Depression, unspecified: Secondary | ICD-10-CM | POA: Diagnosis not present

## 2023-02-13 DIAGNOSIS — I2510 Atherosclerotic heart disease of native coronary artery without angina pectoris: Secondary | ICD-10-CM | POA: Diagnosis not present

## 2023-02-13 DIAGNOSIS — I739 Peripheral vascular disease, unspecified: Secondary | ICD-10-CM | POA: Diagnosis not present

## 2023-02-13 DIAGNOSIS — E88819 Insulin resistance, unspecified: Secondary | ICD-10-CM | POA: Diagnosis not present

## 2023-02-13 DIAGNOSIS — I5040 Unspecified combined systolic (congestive) and diastolic (congestive) heart failure: Secondary | ICD-10-CM | POA: Diagnosis not present

## 2023-02-13 DIAGNOSIS — I255 Ischemic cardiomyopathy: Secondary | ICD-10-CM | POA: Diagnosis not present

## 2023-02-13 DIAGNOSIS — S2249XD Multiple fractures of ribs, unspecified side, subsequent encounter for fracture with routine healing: Secondary | ICD-10-CM | POA: Diagnosis not present

## 2023-02-13 DIAGNOSIS — Z9181 History of falling: Secondary | ICD-10-CM | POA: Diagnosis not present

## 2023-02-13 DIAGNOSIS — M6281 Muscle weakness (generalized): Secondary | ICD-10-CM | POA: Diagnosis not present

## 2023-02-13 DIAGNOSIS — S78119A Complete traumatic amputation at level between unspecified hip and knee, initial encounter: Secondary | ICD-10-CM | POA: Diagnosis not present

## 2023-02-13 DIAGNOSIS — R2689 Other abnormalities of gait and mobility: Secondary | ICD-10-CM | POA: Diagnosis not present

## 2023-02-13 DIAGNOSIS — R41841 Cognitive communication deficit: Secondary | ICD-10-CM | POA: Diagnosis not present

## 2023-02-13 DIAGNOSIS — R269 Unspecified abnormalities of gait and mobility: Secondary | ICD-10-CM | POA: Diagnosis not present

## 2023-02-16 DIAGNOSIS — M6281 Muscle weakness (generalized): Secondary | ICD-10-CM | POA: Diagnosis not present

## 2023-02-16 DIAGNOSIS — R2689 Other abnormalities of gait and mobility: Secondary | ICD-10-CM | POA: Diagnosis not present

## 2023-02-16 DIAGNOSIS — Z9181 History of falling: Secondary | ICD-10-CM | POA: Diagnosis not present

## 2023-02-16 DIAGNOSIS — R41841 Cognitive communication deficit: Secondary | ICD-10-CM | POA: Diagnosis not present

## 2023-02-16 DIAGNOSIS — S2249XD Multiple fractures of ribs, unspecified side, subsequent encounter for fracture with routine healing: Secondary | ICD-10-CM | POA: Diagnosis not present

## 2023-02-17 DIAGNOSIS — S2249XD Multiple fractures of ribs, unspecified side, subsequent encounter for fracture with routine healing: Secondary | ICD-10-CM | POA: Diagnosis not present

## 2023-02-17 DIAGNOSIS — R41841 Cognitive communication deficit: Secondary | ICD-10-CM | POA: Diagnosis not present

## 2023-02-17 DIAGNOSIS — Z9181 History of falling: Secondary | ICD-10-CM | POA: Diagnosis not present

## 2023-02-17 DIAGNOSIS — M6281 Muscle weakness (generalized): Secondary | ICD-10-CM | POA: Diagnosis not present

## 2023-02-17 DIAGNOSIS — R2689 Other abnormalities of gait and mobility: Secondary | ICD-10-CM | POA: Diagnosis not present

## 2023-02-18 DIAGNOSIS — R2689 Other abnormalities of gait and mobility: Secondary | ICD-10-CM | POA: Diagnosis not present

## 2023-02-18 DIAGNOSIS — Z9181 History of falling: Secondary | ICD-10-CM | POA: Diagnosis not present

## 2023-02-18 DIAGNOSIS — M6281 Muscle weakness (generalized): Secondary | ICD-10-CM | POA: Diagnosis not present

## 2023-02-18 DIAGNOSIS — R41841 Cognitive communication deficit: Secondary | ICD-10-CM | POA: Diagnosis not present

## 2023-02-18 DIAGNOSIS — S2249XD Multiple fractures of ribs, unspecified side, subsequent encounter for fracture with routine healing: Secondary | ICD-10-CM | POA: Diagnosis not present

## 2023-02-18 DIAGNOSIS — I10 Essential (primary) hypertension: Secondary | ICD-10-CM | POA: Diagnosis not present

## 2023-02-19 DIAGNOSIS — R2689 Other abnormalities of gait and mobility: Secondary | ICD-10-CM | POA: Diagnosis not present

## 2023-02-19 DIAGNOSIS — S2249XD Multiple fractures of ribs, unspecified side, subsequent encounter for fracture with routine healing: Secondary | ICD-10-CM | POA: Diagnosis not present

## 2023-02-19 DIAGNOSIS — R41841 Cognitive communication deficit: Secondary | ICD-10-CM | POA: Diagnosis not present

## 2023-02-19 DIAGNOSIS — Z9181 History of falling: Secondary | ICD-10-CM | POA: Diagnosis not present

## 2023-02-19 DIAGNOSIS — M6281 Muscle weakness (generalized): Secondary | ICD-10-CM | POA: Diagnosis not present

## 2023-02-20 DIAGNOSIS — M6281 Muscle weakness (generalized): Secondary | ICD-10-CM | POA: Diagnosis not present

## 2023-02-20 DIAGNOSIS — Z8673 Personal history of transient ischemic attack (TIA), and cerebral infarction without residual deficits: Secondary | ICD-10-CM | POA: Diagnosis not present

## 2023-02-20 DIAGNOSIS — R627 Adult failure to thrive: Secondary | ICD-10-CM | POA: Diagnosis not present

## 2023-02-20 DIAGNOSIS — Z89611 Acquired absence of right leg above knee: Secondary | ICD-10-CM | POA: Diagnosis not present

## 2023-02-20 DIAGNOSIS — R2689 Other abnormalities of gait and mobility: Secondary | ICD-10-CM | POA: Diagnosis not present

## 2023-02-20 DIAGNOSIS — M1990 Unspecified osteoarthritis, unspecified site: Secondary | ICD-10-CM | POA: Diagnosis not present

## 2023-02-20 DIAGNOSIS — I739 Peripheral vascular disease, unspecified: Secondary | ICD-10-CM | POA: Diagnosis not present

## 2023-02-20 DIAGNOSIS — G3109 Other frontotemporal dementia: Secondary | ICD-10-CM | POA: Diagnosis not present

## 2023-02-20 DIAGNOSIS — S2249XD Multiple fractures of ribs, unspecified side, subsequent encounter for fracture with routine healing: Secondary | ICD-10-CM | POA: Diagnosis not present

## 2023-02-23 DIAGNOSIS — M6281 Muscle weakness (generalized): Secondary | ICD-10-CM | POA: Diagnosis not present

## 2023-02-23 DIAGNOSIS — R2689 Other abnormalities of gait and mobility: Secondary | ICD-10-CM | POA: Diagnosis not present

## 2023-02-23 DIAGNOSIS — M1990 Unspecified osteoarthritis, unspecified site: Secondary | ICD-10-CM | POA: Diagnosis not present

## 2023-02-23 DIAGNOSIS — Z89611 Acquired absence of right leg above knee: Secondary | ICD-10-CM | POA: Diagnosis not present

## 2023-02-23 DIAGNOSIS — F1320 Sedative, hypnotic or anxiolytic dependence, uncomplicated: Secondary | ICD-10-CM | POA: Diagnosis not present

## 2023-02-23 DIAGNOSIS — R627 Adult failure to thrive: Secondary | ICD-10-CM | POA: Diagnosis not present

## 2023-02-23 DIAGNOSIS — Z8673 Personal history of transient ischemic attack (TIA), and cerebral infarction without residual deficits: Secondary | ICD-10-CM | POA: Diagnosis not present

## 2023-02-23 DIAGNOSIS — F0393 Unspecified dementia, unspecified severity, with mood disturbance: Secondary | ICD-10-CM | POA: Diagnosis not present

## 2023-02-23 DIAGNOSIS — G3109 Other frontotemporal dementia: Secondary | ICD-10-CM | POA: Diagnosis not present

## 2023-02-23 DIAGNOSIS — I739 Peripheral vascular disease, unspecified: Secondary | ICD-10-CM | POA: Diagnosis not present

## 2023-02-23 DIAGNOSIS — I10 Essential (primary) hypertension: Secondary | ICD-10-CM | POA: Diagnosis not present

## 2023-02-23 DIAGNOSIS — S2249XD Multiple fractures of ribs, unspecified side, subsequent encounter for fracture with routine healing: Secondary | ICD-10-CM | POA: Diagnosis not present

## 2023-02-23 DIAGNOSIS — F03911 Unspecified dementia, unspecified severity, with agitation: Secondary | ICD-10-CM | POA: Diagnosis not present

## 2023-02-24 DIAGNOSIS — S2249XD Multiple fractures of ribs, unspecified side, subsequent encounter for fracture with routine healing: Secondary | ICD-10-CM | POA: Diagnosis not present

## 2023-02-24 DIAGNOSIS — R2689 Other abnormalities of gait and mobility: Secondary | ICD-10-CM | POA: Diagnosis not present

## 2023-02-24 DIAGNOSIS — R627 Adult failure to thrive: Secondary | ICD-10-CM | POA: Diagnosis not present

## 2023-02-24 DIAGNOSIS — Z8673 Personal history of transient ischemic attack (TIA), and cerebral infarction without residual deficits: Secondary | ICD-10-CM | POA: Diagnosis not present

## 2023-02-24 DIAGNOSIS — G3109 Other frontotemporal dementia: Secondary | ICD-10-CM | POA: Diagnosis not present

## 2023-02-24 DIAGNOSIS — M6281 Muscle weakness (generalized): Secondary | ICD-10-CM | POA: Diagnosis not present

## 2023-02-24 DIAGNOSIS — M1990 Unspecified osteoarthritis, unspecified site: Secondary | ICD-10-CM | POA: Diagnosis not present

## 2023-02-24 DIAGNOSIS — I739 Peripheral vascular disease, unspecified: Secondary | ICD-10-CM | POA: Diagnosis not present

## 2023-02-24 DIAGNOSIS — Z89611 Acquired absence of right leg above knee: Secondary | ICD-10-CM | POA: Diagnosis not present

## 2023-03-02 DIAGNOSIS — S2249XD Multiple fractures of ribs, unspecified side, subsequent encounter for fracture with routine healing: Secondary | ICD-10-CM | POA: Diagnosis not present

## 2023-03-02 DIAGNOSIS — M1990 Unspecified osteoarthritis, unspecified site: Secondary | ICD-10-CM | POA: Diagnosis not present

## 2023-03-02 DIAGNOSIS — R2689 Other abnormalities of gait and mobility: Secondary | ICD-10-CM | POA: Diagnosis not present

## 2023-03-02 DIAGNOSIS — M6281 Muscle weakness (generalized): Secondary | ICD-10-CM | POA: Diagnosis not present

## 2023-03-02 DIAGNOSIS — R627 Adult failure to thrive: Secondary | ICD-10-CM | POA: Diagnosis not present

## 2023-03-02 DIAGNOSIS — G3109 Other frontotemporal dementia: Secondary | ICD-10-CM | POA: Diagnosis not present

## 2023-03-02 DIAGNOSIS — Z8673 Personal history of transient ischemic attack (TIA), and cerebral infarction without residual deficits: Secondary | ICD-10-CM | POA: Diagnosis not present

## 2023-03-02 DIAGNOSIS — I739 Peripheral vascular disease, unspecified: Secondary | ICD-10-CM | POA: Diagnosis not present

## 2023-03-02 DIAGNOSIS — Z89611 Acquired absence of right leg above knee: Secondary | ICD-10-CM | POA: Diagnosis not present

## 2023-03-03 DIAGNOSIS — I739 Peripheral vascular disease, unspecified: Secondary | ICD-10-CM | POA: Diagnosis not present

## 2023-03-03 DIAGNOSIS — S2249XD Multiple fractures of ribs, unspecified side, subsequent encounter for fracture with routine healing: Secondary | ICD-10-CM | POA: Diagnosis not present

## 2023-03-03 DIAGNOSIS — Z89611 Acquired absence of right leg above knee: Secondary | ICD-10-CM | POA: Diagnosis not present

## 2023-03-03 DIAGNOSIS — G3109 Other frontotemporal dementia: Secondary | ICD-10-CM | POA: Diagnosis not present

## 2023-03-03 DIAGNOSIS — R627 Adult failure to thrive: Secondary | ICD-10-CM | POA: Diagnosis not present

## 2023-03-03 DIAGNOSIS — Z8673 Personal history of transient ischemic attack (TIA), and cerebral infarction without residual deficits: Secondary | ICD-10-CM | POA: Diagnosis not present

## 2023-03-03 DIAGNOSIS — M1990 Unspecified osteoarthritis, unspecified site: Secondary | ICD-10-CM | POA: Diagnosis not present

## 2023-03-03 DIAGNOSIS — M6281 Muscle weakness (generalized): Secondary | ICD-10-CM | POA: Diagnosis not present

## 2023-03-03 DIAGNOSIS — R2689 Other abnormalities of gait and mobility: Secondary | ICD-10-CM | POA: Diagnosis not present

## 2023-03-04 DIAGNOSIS — Z8673 Personal history of transient ischemic attack (TIA), and cerebral infarction without residual deficits: Secondary | ICD-10-CM | POA: Diagnosis not present

## 2023-03-04 DIAGNOSIS — I739 Peripheral vascular disease, unspecified: Secondary | ICD-10-CM | POA: Diagnosis not present

## 2023-03-04 DIAGNOSIS — R627 Adult failure to thrive: Secondary | ICD-10-CM | POA: Diagnosis not present

## 2023-03-04 DIAGNOSIS — M6281 Muscle weakness (generalized): Secondary | ICD-10-CM | POA: Diagnosis not present

## 2023-03-04 DIAGNOSIS — R2689 Other abnormalities of gait and mobility: Secondary | ICD-10-CM | POA: Diagnosis not present

## 2023-03-04 DIAGNOSIS — G3109 Other frontotemporal dementia: Secondary | ICD-10-CM | POA: Diagnosis not present

## 2023-03-04 DIAGNOSIS — S2249XD Multiple fractures of ribs, unspecified side, subsequent encounter for fracture with routine healing: Secondary | ICD-10-CM | POA: Diagnosis not present

## 2023-03-04 DIAGNOSIS — Z89611 Acquired absence of right leg above knee: Secondary | ICD-10-CM | POA: Diagnosis not present

## 2023-03-04 DIAGNOSIS — M1990 Unspecified osteoarthritis, unspecified site: Secondary | ICD-10-CM | POA: Diagnosis not present

## 2023-03-05 DIAGNOSIS — G3109 Other frontotemporal dementia: Secondary | ICD-10-CM | POA: Diagnosis not present

## 2023-03-05 DIAGNOSIS — R627 Adult failure to thrive: Secondary | ICD-10-CM | POA: Diagnosis not present

## 2023-03-05 DIAGNOSIS — R2689 Other abnormalities of gait and mobility: Secondary | ICD-10-CM | POA: Diagnosis not present

## 2023-03-05 DIAGNOSIS — I739 Peripheral vascular disease, unspecified: Secondary | ICD-10-CM | POA: Diagnosis not present

## 2023-03-05 DIAGNOSIS — Z89611 Acquired absence of right leg above knee: Secondary | ICD-10-CM | POA: Diagnosis not present

## 2023-03-05 DIAGNOSIS — Z8673 Personal history of transient ischemic attack (TIA), and cerebral infarction without residual deficits: Secondary | ICD-10-CM | POA: Diagnosis not present

## 2023-03-05 DIAGNOSIS — M1990 Unspecified osteoarthritis, unspecified site: Secondary | ICD-10-CM | POA: Diagnosis not present

## 2023-03-05 DIAGNOSIS — M6281 Muscle weakness (generalized): Secondary | ICD-10-CM | POA: Diagnosis not present

## 2023-03-05 DIAGNOSIS — S2249XD Multiple fractures of ribs, unspecified side, subsequent encounter for fracture with routine healing: Secondary | ICD-10-CM | POA: Diagnosis not present

## 2023-03-06 DIAGNOSIS — M1990 Unspecified osteoarthritis, unspecified site: Secondary | ICD-10-CM | POA: Diagnosis not present

## 2023-03-06 DIAGNOSIS — R627 Adult failure to thrive: Secondary | ICD-10-CM | POA: Diagnosis not present

## 2023-03-06 DIAGNOSIS — Z8673 Personal history of transient ischemic attack (TIA), and cerebral infarction without residual deficits: Secondary | ICD-10-CM | POA: Diagnosis not present

## 2023-03-06 DIAGNOSIS — R2689 Other abnormalities of gait and mobility: Secondary | ICD-10-CM | POA: Diagnosis not present

## 2023-03-06 DIAGNOSIS — S2249XD Multiple fractures of ribs, unspecified side, subsequent encounter for fracture with routine healing: Secondary | ICD-10-CM | POA: Diagnosis not present

## 2023-03-06 DIAGNOSIS — Z89611 Acquired absence of right leg above knee: Secondary | ICD-10-CM | POA: Diagnosis not present

## 2023-03-06 DIAGNOSIS — I739 Peripheral vascular disease, unspecified: Secondary | ICD-10-CM | POA: Diagnosis not present

## 2023-03-06 DIAGNOSIS — G3109 Other frontotemporal dementia: Secondary | ICD-10-CM | POA: Diagnosis not present

## 2023-03-06 DIAGNOSIS — M6281 Muscle weakness (generalized): Secondary | ICD-10-CM | POA: Diagnosis not present

## 2023-03-09 DIAGNOSIS — M1990 Unspecified osteoarthritis, unspecified site: Secondary | ICD-10-CM | POA: Diagnosis not present

## 2023-03-09 DIAGNOSIS — S2249XD Multiple fractures of ribs, unspecified side, subsequent encounter for fracture with routine healing: Secondary | ICD-10-CM | POA: Diagnosis not present

## 2023-03-09 DIAGNOSIS — M6281 Muscle weakness (generalized): Secondary | ICD-10-CM | POA: Diagnosis not present

## 2023-03-09 DIAGNOSIS — R2689 Other abnormalities of gait and mobility: Secondary | ICD-10-CM | POA: Diagnosis not present

## 2023-03-09 DIAGNOSIS — Z89611 Acquired absence of right leg above knee: Secondary | ICD-10-CM | POA: Diagnosis not present

## 2023-03-09 DIAGNOSIS — Z8673 Personal history of transient ischemic attack (TIA), and cerebral infarction without residual deficits: Secondary | ICD-10-CM | POA: Diagnosis not present

## 2023-03-09 DIAGNOSIS — G3109 Other frontotemporal dementia: Secondary | ICD-10-CM | POA: Diagnosis not present

## 2023-03-09 DIAGNOSIS — R627 Adult failure to thrive: Secondary | ICD-10-CM | POA: Diagnosis not present

## 2023-03-09 DIAGNOSIS — I739 Peripheral vascular disease, unspecified: Secondary | ICD-10-CM | POA: Diagnosis not present

## 2023-03-10 DIAGNOSIS — I110 Hypertensive heart disease with heart failure: Secondary | ICD-10-CM | POA: Diagnosis not present

## 2023-03-10 DIAGNOSIS — I2510 Atherosclerotic heart disease of native coronary artery without angina pectoris: Secondary | ICD-10-CM | POA: Diagnosis not present

## 2023-03-10 DIAGNOSIS — G3109 Other frontotemporal dementia: Secondary | ICD-10-CM | POA: Diagnosis not present

## 2023-03-10 DIAGNOSIS — M1990 Unspecified osteoarthritis, unspecified site: Secondary | ICD-10-CM | POA: Diagnosis not present

## 2023-03-10 DIAGNOSIS — S2249XD Multiple fractures of ribs, unspecified side, subsequent encounter for fracture with routine healing: Secondary | ICD-10-CM | POA: Diagnosis not present

## 2023-03-10 DIAGNOSIS — Z8673 Personal history of transient ischemic attack (TIA), and cerebral infarction without residual deficits: Secondary | ICD-10-CM | POA: Diagnosis not present

## 2023-03-10 DIAGNOSIS — R2689 Other abnormalities of gait and mobility: Secondary | ICD-10-CM | POA: Diagnosis not present

## 2023-03-10 DIAGNOSIS — M6281 Muscle weakness (generalized): Secondary | ICD-10-CM | POA: Diagnosis not present

## 2023-03-10 DIAGNOSIS — Z89611 Acquired absence of right leg above knee: Secondary | ICD-10-CM | POA: Diagnosis not present

## 2023-03-10 DIAGNOSIS — I69322 Dysarthria following cerebral infarction: Secondary | ICD-10-CM | POA: Diagnosis not present

## 2023-03-10 DIAGNOSIS — I5040 Unspecified combined systolic (congestive) and diastolic (congestive) heart failure: Secondary | ICD-10-CM | POA: Diagnosis not present

## 2023-03-10 DIAGNOSIS — I739 Peripheral vascular disease, unspecified: Secondary | ICD-10-CM | POA: Diagnosis not present

## 2023-03-10 DIAGNOSIS — F39 Unspecified mood [affective] disorder: Secondary | ICD-10-CM | POA: Diagnosis not present

## 2023-03-10 DIAGNOSIS — F0390 Unspecified dementia without behavioral disturbance: Secondary | ICD-10-CM | POA: Diagnosis not present

## 2023-03-10 DIAGNOSIS — R627 Adult failure to thrive: Secondary | ICD-10-CM | POA: Diagnosis not present

## 2023-03-11 DIAGNOSIS — Z89611 Acquired absence of right leg above knee: Secondary | ICD-10-CM | POA: Diagnosis not present

## 2023-03-11 DIAGNOSIS — I739 Peripheral vascular disease, unspecified: Secondary | ICD-10-CM | POA: Diagnosis not present

## 2023-03-11 DIAGNOSIS — Z8673 Personal history of transient ischemic attack (TIA), and cerebral infarction without residual deficits: Secondary | ICD-10-CM | POA: Diagnosis not present

## 2023-03-11 DIAGNOSIS — R2689 Other abnormalities of gait and mobility: Secondary | ICD-10-CM | POA: Diagnosis not present

## 2023-03-11 DIAGNOSIS — M6281 Muscle weakness (generalized): Secondary | ICD-10-CM | POA: Diagnosis not present

## 2023-03-11 DIAGNOSIS — G3109 Other frontotemporal dementia: Secondary | ICD-10-CM | POA: Diagnosis not present

## 2023-03-11 DIAGNOSIS — R627 Adult failure to thrive: Secondary | ICD-10-CM | POA: Diagnosis not present

## 2023-03-11 DIAGNOSIS — S2249XD Multiple fractures of ribs, unspecified side, subsequent encounter for fracture with routine healing: Secondary | ICD-10-CM | POA: Diagnosis not present

## 2023-03-11 DIAGNOSIS — M1990 Unspecified osteoarthritis, unspecified site: Secondary | ICD-10-CM | POA: Diagnosis not present

## 2023-03-12 DIAGNOSIS — R627 Adult failure to thrive: Secondary | ICD-10-CM | POA: Diagnosis not present

## 2023-03-12 DIAGNOSIS — Z89611 Acquired absence of right leg above knee: Secondary | ICD-10-CM | POA: Diagnosis not present

## 2023-03-12 DIAGNOSIS — M1990 Unspecified osteoarthritis, unspecified site: Secondary | ICD-10-CM | POA: Diagnosis not present

## 2023-03-12 DIAGNOSIS — R2689 Other abnormalities of gait and mobility: Secondary | ICD-10-CM | POA: Diagnosis not present

## 2023-03-12 DIAGNOSIS — M6281 Muscle weakness (generalized): Secondary | ICD-10-CM | POA: Diagnosis not present

## 2023-03-12 DIAGNOSIS — S2249XD Multiple fractures of ribs, unspecified side, subsequent encounter for fracture with routine healing: Secondary | ICD-10-CM | POA: Diagnosis not present

## 2023-03-12 DIAGNOSIS — Z8673 Personal history of transient ischemic attack (TIA), and cerebral infarction without residual deficits: Secondary | ICD-10-CM | POA: Diagnosis not present

## 2023-03-12 DIAGNOSIS — G3109 Other frontotemporal dementia: Secondary | ICD-10-CM | POA: Diagnosis not present

## 2023-03-12 DIAGNOSIS — I739 Peripheral vascular disease, unspecified: Secondary | ICD-10-CM | POA: Diagnosis not present

## 2023-03-13 DIAGNOSIS — S2249XD Multiple fractures of ribs, unspecified side, subsequent encounter for fracture with routine healing: Secondary | ICD-10-CM | POA: Diagnosis not present

## 2023-03-13 DIAGNOSIS — I739 Peripheral vascular disease, unspecified: Secondary | ICD-10-CM | POA: Diagnosis not present

## 2023-03-13 DIAGNOSIS — Z8673 Personal history of transient ischemic attack (TIA), and cerebral infarction without residual deficits: Secondary | ICD-10-CM | POA: Diagnosis not present

## 2023-03-13 DIAGNOSIS — G3109 Other frontotemporal dementia: Secondary | ICD-10-CM | POA: Diagnosis not present

## 2023-03-13 DIAGNOSIS — M1990 Unspecified osteoarthritis, unspecified site: Secondary | ICD-10-CM | POA: Diagnosis not present

## 2023-03-13 DIAGNOSIS — M6281 Muscle weakness (generalized): Secondary | ICD-10-CM | POA: Diagnosis not present

## 2023-03-13 DIAGNOSIS — R2689 Other abnormalities of gait and mobility: Secondary | ICD-10-CM | POA: Diagnosis not present

## 2023-03-13 DIAGNOSIS — R627 Adult failure to thrive: Secondary | ICD-10-CM | POA: Diagnosis not present

## 2023-03-13 DIAGNOSIS — Z89611 Acquired absence of right leg above knee: Secondary | ICD-10-CM | POA: Diagnosis not present

## 2023-03-15 ENCOUNTER — Other Ambulatory Visit: Payer: Self-pay | Admitting: Family

## 2023-03-16 DIAGNOSIS — F411 Generalized anxiety disorder: Secondary | ICD-10-CM | POA: Diagnosis not present

## 2023-03-16 DIAGNOSIS — Z89611 Acquired absence of right leg above knee: Secondary | ICD-10-CM | POA: Diagnosis not present

## 2023-03-16 DIAGNOSIS — M1990 Unspecified osteoarthritis, unspecified site: Secondary | ICD-10-CM | POA: Diagnosis not present

## 2023-03-16 DIAGNOSIS — G3109 Other frontotemporal dementia: Secondary | ICD-10-CM | POA: Diagnosis not present

## 2023-03-16 DIAGNOSIS — S2249XD Multiple fractures of ribs, unspecified side, subsequent encounter for fracture with routine healing: Secondary | ICD-10-CM | POA: Diagnosis not present

## 2023-03-16 DIAGNOSIS — M6281 Muscle weakness (generalized): Secondary | ICD-10-CM | POA: Diagnosis not present

## 2023-03-16 DIAGNOSIS — F02B3 Dementia in other diseases classified elsewhere, moderate, with mood disturbance: Secondary | ICD-10-CM | POA: Diagnosis not present

## 2023-03-16 DIAGNOSIS — F4310 Post-traumatic stress disorder, unspecified: Secondary | ICD-10-CM | POA: Diagnosis not present

## 2023-03-16 DIAGNOSIS — R627 Adult failure to thrive: Secondary | ICD-10-CM | POA: Diagnosis not present

## 2023-03-16 DIAGNOSIS — Z8673 Personal history of transient ischemic attack (TIA), and cerebral infarction without residual deficits: Secondary | ICD-10-CM | POA: Diagnosis not present

## 2023-03-16 DIAGNOSIS — R2689 Other abnormalities of gait and mobility: Secondary | ICD-10-CM | POA: Diagnosis not present

## 2023-03-16 DIAGNOSIS — I739 Peripheral vascular disease, unspecified: Secondary | ICD-10-CM | POA: Diagnosis not present

## 2023-03-17 DIAGNOSIS — M6281 Muscle weakness (generalized): Secondary | ICD-10-CM | POA: Diagnosis not present

## 2023-03-17 DIAGNOSIS — M1990 Unspecified osteoarthritis, unspecified site: Secondary | ICD-10-CM | POA: Diagnosis not present

## 2023-03-17 DIAGNOSIS — I739 Peripheral vascular disease, unspecified: Secondary | ICD-10-CM | POA: Diagnosis not present

## 2023-03-17 DIAGNOSIS — R2689 Other abnormalities of gait and mobility: Secondary | ICD-10-CM | POA: Diagnosis not present

## 2023-03-17 DIAGNOSIS — Z89611 Acquired absence of right leg above knee: Secondary | ICD-10-CM | POA: Diagnosis not present

## 2023-03-17 DIAGNOSIS — G3109 Other frontotemporal dementia: Secondary | ICD-10-CM | POA: Diagnosis not present

## 2023-03-17 DIAGNOSIS — R627 Adult failure to thrive: Secondary | ICD-10-CM | POA: Diagnosis not present

## 2023-03-17 DIAGNOSIS — S2249XD Multiple fractures of ribs, unspecified side, subsequent encounter for fracture with routine healing: Secondary | ICD-10-CM | POA: Diagnosis not present

## 2023-03-17 DIAGNOSIS — Z8673 Personal history of transient ischemic attack (TIA), and cerebral infarction without residual deficits: Secondary | ICD-10-CM | POA: Diagnosis not present

## 2023-03-18 DIAGNOSIS — Z8673 Personal history of transient ischemic attack (TIA), and cerebral infarction without residual deficits: Secondary | ICD-10-CM | POA: Diagnosis not present

## 2023-03-18 DIAGNOSIS — S2249XD Multiple fractures of ribs, unspecified side, subsequent encounter for fracture with routine healing: Secondary | ICD-10-CM | POA: Diagnosis not present

## 2023-03-18 DIAGNOSIS — R2689 Other abnormalities of gait and mobility: Secondary | ICD-10-CM | POA: Diagnosis not present

## 2023-03-18 DIAGNOSIS — Z89611 Acquired absence of right leg above knee: Secondary | ICD-10-CM | POA: Diagnosis not present

## 2023-03-18 DIAGNOSIS — M1990 Unspecified osteoarthritis, unspecified site: Secondary | ICD-10-CM | POA: Diagnosis not present

## 2023-03-18 DIAGNOSIS — I739 Peripheral vascular disease, unspecified: Secondary | ICD-10-CM | POA: Diagnosis not present

## 2023-03-18 DIAGNOSIS — M6281 Muscle weakness (generalized): Secondary | ICD-10-CM | POA: Diagnosis not present

## 2023-03-18 DIAGNOSIS — G3109 Other frontotemporal dementia: Secondary | ICD-10-CM | POA: Diagnosis not present

## 2023-03-18 DIAGNOSIS — R627 Adult failure to thrive: Secondary | ICD-10-CM | POA: Diagnosis not present

## 2023-03-19 DIAGNOSIS — M6281 Muscle weakness (generalized): Secondary | ICD-10-CM | POA: Diagnosis not present

## 2023-03-19 DIAGNOSIS — Z89611 Acquired absence of right leg above knee: Secondary | ICD-10-CM | POA: Diagnosis not present

## 2023-03-19 DIAGNOSIS — M1990 Unspecified osteoarthritis, unspecified site: Secondary | ICD-10-CM | POA: Diagnosis not present

## 2023-03-19 DIAGNOSIS — I739 Peripheral vascular disease, unspecified: Secondary | ICD-10-CM | POA: Diagnosis not present

## 2023-03-19 DIAGNOSIS — G3109 Other frontotemporal dementia: Secondary | ICD-10-CM | POA: Diagnosis not present

## 2023-03-19 DIAGNOSIS — R627 Adult failure to thrive: Secondary | ICD-10-CM | POA: Diagnosis not present

## 2023-03-19 DIAGNOSIS — R2689 Other abnormalities of gait and mobility: Secondary | ICD-10-CM | POA: Diagnosis not present

## 2023-03-19 DIAGNOSIS — Z8673 Personal history of transient ischemic attack (TIA), and cerebral infarction without residual deficits: Secondary | ICD-10-CM | POA: Diagnosis not present

## 2023-03-19 DIAGNOSIS — S2249XD Multiple fractures of ribs, unspecified side, subsequent encounter for fracture with routine healing: Secondary | ICD-10-CM | POA: Diagnosis not present

## 2023-03-20 DIAGNOSIS — R627 Adult failure to thrive: Secondary | ICD-10-CM | POA: Diagnosis not present

## 2023-03-20 DIAGNOSIS — Z8673 Personal history of transient ischemic attack (TIA), and cerebral infarction without residual deficits: Secondary | ICD-10-CM | POA: Diagnosis not present

## 2023-03-20 DIAGNOSIS — Z89611 Acquired absence of right leg above knee: Secondary | ICD-10-CM | POA: Diagnosis not present

## 2023-03-20 DIAGNOSIS — I739 Peripheral vascular disease, unspecified: Secondary | ICD-10-CM | POA: Diagnosis not present

## 2023-03-20 DIAGNOSIS — M1990 Unspecified osteoarthritis, unspecified site: Secondary | ICD-10-CM | POA: Diagnosis not present

## 2023-03-20 DIAGNOSIS — M6281 Muscle weakness (generalized): Secondary | ICD-10-CM | POA: Diagnosis not present

## 2023-03-20 DIAGNOSIS — G3109 Other frontotemporal dementia: Secondary | ICD-10-CM | POA: Diagnosis not present

## 2023-03-20 DIAGNOSIS — R2689 Other abnormalities of gait and mobility: Secondary | ICD-10-CM | POA: Diagnosis not present

## 2023-03-20 DIAGNOSIS — S2249XD Multiple fractures of ribs, unspecified side, subsequent encounter for fracture with routine healing: Secondary | ICD-10-CM | POA: Diagnosis not present

## 2023-03-21 DIAGNOSIS — I739 Peripheral vascular disease, unspecified: Secondary | ICD-10-CM | POA: Diagnosis not present

## 2023-03-21 DIAGNOSIS — M1990 Unspecified osteoarthritis, unspecified site: Secondary | ICD-10-CM | POA: Diagnosis not present

## 2023-03-21 DIAGNOSIS — Z8673 Personal history of transient ischemic attack (TIA), and cerebral infarction without residual deficits: Secondary | ICD-10-CM | POA: Diagnosis not present

## 2023-03-21 DIAGNOSIS — S2249XD Multiple fractures of ribs, unspecified side, subsequent encounter for fracture with routine healing: Secondary | ICD-10-CM | POA: Diagnosis not present

## 2023-03-21 DIAGNOSIS — M6281 Muscle weakness (generalized): Secondary | ICD-10-CM | POA: Diagnosis not present

## 2023-03-21 DIAGNOSIS — Z89611 Acquired absence of right leg above knee: Secondary | ICD-10-CM | POA: Diagnosis not present

## 2023-03-21 DIAGNOSIS — R2689 Other abnormalities of gait and mobility: Secondary | ICD-10-CM | POA: Diagnosis not present

## 2023-03-21 DIAGNOSIS — G3109 Other frontotemporal dementia: Secondary | ICD-10-CM | POA: Diagnosis not present

## 2023-03-21 DIAGNOSIS — R627 Adult failure to thrive: Secondary | ICD-10-CM | POA: Diagnosis not present

## 2023-03-23 DIAGNOSIS — G3109 Other frontotemporal dementia: Secondary | ICD-10-CM | POA: Diagnosis not present

## 2023-03-23 DIAGNOSIS — Z8673 Personal history of transient ischemic attack (TIA), and cerebral infarction without residual deficits: Secondary | ICD-10-CM | POA: Diagnosis not present

## 2023-03-23 DIAGNOSIS — I739 Peripheral vascular disease, unspecified: Secondary | ICD-10-CM | POA: Diagnosis not present

## 2023-03-23 DIAGNOSIS — R627 Adult failure to thrive: Secondary | ICD-10-CM | POA: Diagnosis not present

## 2023-03-23 DIAGNOSIS — S2249XA Multiple fractures of ribs, unspecified side, initial encounter for closed fracture: Secondary | ICD-10-CM | POA: Diagnosis not present

## 2023-03-23 DIAGNOSIS — Z89611 Acquired absence of right leg above knee: Secondary | ICD-10-CM | POA: Diagnosis not present

## 2023-03-23 DIAGNOSIS — S2249XD Multiple fractures of ribs, unspecified side, subsequent encounter for fracture with routine healing: Secondary | ICD-10-CM | POA: Diagnosis not present

## 2023-03-23 DIAGNOSIS — M1990 Unspecified osteoarthritis, unspecified site: Secondary | ICD-10-CM | POA: Diagnosis not present

## 2023-03-23 DIAGNOSIS — M6259 Muscle wasting and atrophy, not elsewhere classified, multiple sites: Secondary | ICD-10-CM | POA: Diagnosis not present

## 2023-03-24 DIAGNOSIS — M1990 Unspecified osteoarthritis, unspecified site: Secondary | ICD-10-CM | POA: Diagnosis not present

## 2023-03-24 DIAGNOSIS — S2249XA Multiple fractures of ribs, unspecified side, initial encounter for closed fracture: Secondary | ICD-10-CM | POA: Diagnosis not present

## 2023-03-24 DIAGNOSIS — R627 Adult failure to thrive: Secondary | ICD-10-CM | POA: Diagnosis not present

## 2023-03-24 DIAGNOSIS — I739 Peripheral vascular disease, unspecified: Secondary | ICD-10-CM | POA: Diagnosis not present

## 2023-03-24 DIAGNOSIS — M6259 Muscle wasting and atrophy, not elsewhere classified, multiple sites: Secondary | ICD-10-CM | POA: Diagnosis not present

## 2023-03-24 DIAGNOSIS — S2249XD Multiple fractures of ribs, unspecified side, subsequent encounter for fracture with routine healing: Secondary | ICD-10-CM | POA: Diagnosis not present

## 2023-03-24 DIAGNOSIS — Z8673 Personal history of transient ischemic attack (TIA), and cerebral infarction without residual deficits: Secondary | ICD-10-CM | POA: Diagnosis not present

## 2023-03-24 DIAGNOSIS — Z89611 Acquired absence of right leg above knee: Secondary | ICD-10-CM | POA: Diagnosis not present

## 2023-03-24 DIAGNOSIS — G3109 Other frontotemporal dementia: Secondary | ICD-10-CM | POA: Diagnosis not present

## 2023-03-25 DIAGNOSIS — S2249XA Multiple fractures of ribs, unspecified side, initial encounter for closed fracture: Secondary | ICD-10-CM | POA: Diagnosis not present

## 2023-03-25 DIAGNOSIS — R627 Adult failure to thrive: Secondary | ICD-10-CM | POA: Diagnosis not present

## 2023-03-25 DIAGNOSIS — S2249XD Multiple fractures of ribs, unspecified side, subsequent encounter for fracture with routine healing: Secondary | ICD-10-CM | POA: Diagnosis not present

## 2023-03-25 DIAGNOSIS — M1990 Unspecified osteoarthritis, unspecified site: Secondary | ICD-10-CM | POA: Diagnosis not present

## 2023-03-25 DIAGNOSIS — I739 Peripheral vascular disease, unspecified: Secondary | ICD-10-CM | POA: Diagnosis not present

## 2023-03-25 DIAGNOSIS — G3109 Other frontotemporal dementia: Secondary | ICD-10-CM | POA: Diagnosis not present

## 2023-03-25 DIAGNOSIS — Z89611 Acquired absence of right leg above knee: Secondary | ICD-10-CM | POA: Diagnosis not present

## 2023-03-25 DIAGNOSIS — Z8673 Personal history of transient ischemic attack (TIA), and cerebral infarction without residual deficits: Secondary | ICD-10-CM | POA: Diagnosis not present

## 2023-03-25 DIAGNOSIS — M6259 Muscle wasting and atrophy, not elsewhere classified, multiple sites: Secondary | ICD-10-CM | POA: Diagnosis not present

## 2023-03-26 DIAGNOSIS — G3109 Other frontotemporal dementia: Secondary | ICD-10-CM | POA: Diagnosis not present

## 2023-03-26 DIAGNOSIS — M1990 Unspecified osteoarthritis, unspecified site: Secondary | ICD-10-CM | POA: Diagnosis not present

## 2023-03-26 DIAGNOSIS — I739 Peripheral vascular disease, unspecified: Secondary | ICD-10-CM | POA: Diagnosis not present

## 2023-03-26 DIAGNOSIS — M6259 Muscle wasting and atrophy, not elsewhere classified, multiple sites: Secondary | ICD-10-CM | POA: Diagnosis not present

## 2023-03-26 DIAGNOSIS — R627 Adult failure to thrive: Secondary | ICD-10-CM | POA: Diagnosis not present

## 2023-03-26 DIAGNOSIS — Z89611 Acquired absence of right leg above knee: Secondary | ICD-10-CM | POA: Diagnosis not present

## 2023-03-26 DIAGNOSIS — Z8673 Personal history of transient ischemic attack (TIA), and cerebral infarction without residual deficits: Secondary | ICD-10-CM | POA: Diagnosis not present

## 2023-03-26 DIAGNOSIS — S2249XA Multiple fractures of ribs, unspecified side, initial encounter for closed fracture: Secondary | ICD-10-CM | POA: Diagnosis not present

## 2023-03-26 DIAGNOSIS — S2249XD Multiple fractures of ribs, unspecified side, subsequent encounter for fracture with routine healing: Secondary | ICD-10-CM | POA: Diagnosis not present

## 2023-03-27 DIAGNOSIS — I739 Peripheral vascular disease, unspecified: Secondary | ICD-10-CM | POA: Diagnosis not present

## 2023-03-27 DIAGNOSIS — Z89611 Acquired absence of right leg above knee: Secondary | ICD-10-CM | POA: Diagnosis not present

## 2023-03-27 DIAGNOSIS — S2249XA Multiple fractures of ribs, unspecified side, initial encounter for closed fracture: Secondary | ICD-10-CM | POA: Diagnosis not present

## 2023-03-27 DIAGNOSIS — G3109 Other frontotemporal dementia: Secondary | ICD-10-CM | POA: Diagnosis not present

## 2023-03-27 DIAGNOSIS — M1990 Unspecified osteoarthritis, unspecified site: Secondary | ICD-10-CM | POA: Diagnosis not present

## 2023-03-27 DIAGNOSIS — Z8673 Personal history of transient ischemic attack (TIA), and cerebral infarction without residual deficits: Secondary | ICD-10-CM | POA: Diagnosis not present

## 2023-03-27 DIAGNOSIS — R627 Adult failure to thrive: Secondary | ICD-10-CM | POA: Diagnosis not present

## 2023-03-27 DIAGNOSIS — S2249XD Multiple fractures of ribs, unspecified side, subsequent encounter for fracture with routine healing: Secondary | ICD-10-CM | POA: Diagnosis not present

## 2023-03-27 DIAGNOSIS — M6259 Muscle wasting and atrophy, not elsewhere classified, multiple sites: Secondary | ICD-10-CM | POA: Diagnosis not present

## 2023-03-30 DIAGNOSIS — M6259 Muscle wasting and atrophy, not elsewhere classified, multiple sites: Secondary | ICD-10-CM | POA: Diagnosis not present

## 2023-03-30 DIAGNOSIS — S2249XA Multiple fractures of ribs, unspecified side, initial encounter for closed fracture: Secondary | ICD-10-CM | POA: Diagnosis not present

## 2023-03-30 DIAGNOSIS — I739 Peripheral vascular disease, unspecified: Secondary | ICD-10-CM | POA: Diagnosis not present

## 2023-03-30 DIAGNOSIS — G3109 Other frontotemporal dementia: Secondary | ICD-10-CM | POA: Diagnosis not present

## 2023-03-30 DIAGNOSIS — R627 Adult failure to thrive: Secondary | ICD-10-CM | POA: Diagnosis not present

## 2023-03-30 DIAGNOSIS — Z8673 Personal history of transient ischemic attack (TIA), and cerebral infarction without residual deficits: Secondary | ICD-10-CM | POA: Diagnosis not present

## 2023-03-30 DIAGNOSIS — M1990 Unspecified osteoarthritis, unspecified site: Secondary | ICD-10-CM | POA: Diagnosis not present

## 2023-03-30 DIAGNOSIS — Z89611 Acquired absence of right leg above knee: Secondary | ICD-10-CM | POA: Diagnosis not present

## 2023-03-30 DIAGNOSIS — S2249XD Multiple fractures of ribs, unspecified side, subsequent encounter for fracture with routine healing: Secondary | ICD-10-CM | POA: Diagnosis not present

## 2023-03-31 DIAGNOSIS — M1990 Unspecified osteoarthritis, unspecified site: Secondary | ICD-10-CM | POA: Diagnosis not present

## 2023-03-31 DIAGNOSIS — Z8673 Personal history of transient ischemic attack (TIA), and cerebral infarction without residual deficits: Secondary | ICD-10-CM | POA: Diagnosis not present

## 2023-03-31 DIAGNOSIS — R627 Adult failure to thrive: Secondary | ICD-10-CM | POA: Diagnosis not present

## 2023-03-31 DIAGNOSIS — Z89611 Acquired absence of right leg above knee: Secondary | ICD-10-CM | POA: Diagnosis not present

## 2023-03-31 DIAGNOSIS — S2249XD Multiple fractures of ribs, unspecified side, subsequent encounter for fracture with routine healing: Secondary | ICD-10-CM | POA: Diagnosis not present

## 2023-03-31 DIAGNOSIS — I739 Peripheral vascular disease, unspecified: Secondary | ICD-10-CM | POA: Diagnosis not present

## 2023-03-31 DIAGNOSIS — G3109 Other frontotemporal dementia: Secondary | ICD-10-CM | POA: Diagnosis not present

## 2023-03-31 DIAGNOSIS — M6259 Muscle wasting and atrophy, not elsewhere classified, multiple sites: Secondary | ICD-10-CM | POA: Diagnosis not present

## 2023-03-31 DIAGNOSIS — S2249XA Multiple fractures of ribs, unspecified side, initial encounter for closed fracture: Secondary | ICD-10-CM | POA: Diagnosis not present

## 2023-04-01 DIAGNOSIS — Z89611 Acquired absence of right leg above knee: Secondary | ICD-10-CM | POA: Diagnosis not present

## 2023-04-01 DIAGNOSIS — R627 Adult failure to thrive: Secondary | ICD-10-CM | POA: Diagnosis not present

## 2023-04-01 DIAGNOSIS — S2249XA Multiple fractures of ribs, unspecified side, initial encounter for closed fracture: Secondary | ICD-10-CM | POA: Diagnosis not present

## 2023-04-01 DIAGNOSIS — S2249XD Multiple fractures of ribs, unspecified side, subsequent encounter for fracture with routine healing: Secondary | ICD-10-CM | POA: Diagnosis not present

## 2023-04-01 DIAGNOSIS — M6259 Muscle wasting and atrophy, not elsewhere classified, multiple sites: Secondary | ICD-10-CM | POA: Diagnosis not present

## 2023-04-01 DIAGNOSIS — M1990 Unspecified osteoarthritis, unspecified site: Secondary | ICD-10-CM | POA: Diagnosis not present

## 2023-04-01 DIAGNOSIS — I739 Peripheral vascular disease, unspecified: Secondary | ICD-10-CM | POA: Diagnosis not present

## 2023-04-01 DIAGNOSIS — G3109 Other frontotemporal dementia: Secondary | ICD-10-CM | POA: Diagnosis not present

## 2023-04-01 DIAGNOSIS — Z8673 Personal history of transient ischemic attack (TIA), and cerebral infarction without residual deficits: Secondary | ICD-10-CM | POA: Diagnosis not present

## 2023-04-02 DIAGNOSIS — Z89611 Acquired absence of right leg above knee: Secondary | ICD-10-CM | POA: Diagnosis not present

## 2023-04-02 DIAGNOSIS — S2249XA Multiple fractures of ribs, unspecified side, initial encounter for closed fracture: Secondary | ICD-10-CM | POA: Diagnosis not present

## 2023-04-02 DIAGNOSIS — G3109 Other frontotemporal dementia: Secondary | ICD-10-CM | POA: Diagnosis not present

## 2023-04-02 DIAGNOSIS — I739 Peripheral vascular disease, unspecified: Secondary | ICD-10-CM | POA: Diagnosis not present

## 2023-04-02 DIAGNOSIS — R627 Adult failure to thrive: Secondary | ICD-10-CM | POA: Diagnosis not present

## 2023-04-02 DIAGNOSIS — M1990 Unspecified osteoarthritis, unspecified site: Secondary | ICD-10-CM | POA: Diagnosis not present

## 2023-04-02 DIAGNOSIS — Z8673 Personal history of transient ischemic attack (TIA), and cerebral infarction without residual deficits: Secondary | ICD-10-CM | POA: Diagnosis not present

## 2023-04-02 DIAGNOSIS — M6259 Muscle wasting and atrophy, not elsewhere classified, multiple sites: Secondary | ICD-10-CM | POA: Diagnosis not present

## 2023-04-02 DIAGNOSIS — S2249XD Multiple fractures of ribs, unspecified side, subsequent encounter for fracture with routine healing: Secondary | ICD-10-CM | POA: Diagnosis not present

## 2023-04-06 DIAGNOSIS — M6259 Muscle wasting and atrophy, not elsewhere classified, multiple sites: Secondary | ICD-10-CM | POA: Diagnosis not present

## 2023-04-06 DIAGNOSIS — I739 Peripheral vascular disease, unspecified: Secondary | ICD-10-CM | POA: Diagnosis not present

## 2023-04-06 DIAGNOSIS — F4310 Post-traumatic stress disorder, unspecified: Secondary | ICD-10-CM | POA: Diagnosis not present

## 2023-04-06 DIAGNOSIS — F411 Generalized anxiety disorder: Secondary | ICD-10-CM | POA: Diagnosis not present

## 2023-04-06 DIAGNOSIS — S2249XA Multiple fractures of ribs, unspecified side, initial encounter for closed fracture: Secondary | ICD-10-CM | POA: Diagnosis not present

## 2023-04-06 DIAGNOSIS — F02B3 Dementia in other diseases classified elsewhere, moderate, with mood disturbance: Secondary | ICD-10-CM | POA: Diagnosis not present

## 2023-04-06 DIAGNOSIS — Z89611 Acquired absence of right leg above knee: Secondary | ICD-10-CM | POA: Diagnosis not present

## 2023-04-06 DIAGNOSIS — Z8673 Personal history of transient ischemic attack (TIA), and cerebral infarction without residual deficits: Secondary | ICD-10-CM | POA: Diagnosis not present

## 2023-04-06 DIAGNOSIS — G3109 Other frontotemporal dementia: Secondary | ICD-10-CM | POA: Diagnosis not present

## 2023-04-06 DIAGNOSIS — R627 Adult failure to thrive: Secondary | ICD-10-CM | POA: Diagnosis not present

## 2023-04-06 DIAGNOSIS — S2249XD Multiple fractures of ribs, unspecified side, subsequent encounter for fracture with routine healing: Secondary | ICD-10-CM | POA: Diagnosis not present

## 2023-04-06 DIAGNOSIS — M1990 Unspecified osteoarthritis, unspecified site: Secondary | ICD-10-CM | POA: Diagnosis not present

## 2023-04-07 DIAGNOSIS — R627 Adult failure to thrive: Secondary | ICD-10-CM | POA: Diagnosis not present

## 2023-04-07 DIAGNOSIS — W19XXXA Unspecified fall, initial encounter: Secondary | ICD-10-CM | POA: Diagnosis not present

## 2023-04-07 DIAGNOSIS — Z8673 Personal history of transient ischemic attack (TIA), and cerebral infarction without residual deficits: Secondary | ICD-10-CM | POA: Diagnosis not present

## 2023-04-07 DIAGNOSIS — Z89611 Acquired absence of right leg above knee: Secondary | ICD-10-CM | POA: Diagnosis not present

## 2023-04-07 DIAGNOSIS — F0390 Unspecified dementia without behavioral disturbance: Secondary | ICD-10-CM | POA: Diagnosis not present

## 2023-04-07 DIAGNOSIS — M1990 Unspecified osteoarthritis, unspecified site: Secondary | ICD-10-CM | POA: Diagnosis not present

## 2023-04-07 DIAGNOSIS — S2249XA Multiple fractures of ribs, unspecified side, initial encounter for closed fracture: Secondary | ICD-10-CM | POA: Diagnosis not present

## 2023-04-07 DIAGNOSIS — I110 Hypertensive heart disease with heart failure: Secondary | ICD-10-CM | POA: Diagnosis not present

## 2023-04-07 DIAGNOSIS — S2249XD Multiple fractures of ribs, unspecified side, subsequent encounter for fracture with routine healing: Secondary | ICD-10-CM | POA: Diagnosis not present

## 2023-04-07 DIAGNOSIS — G3109 Other frontotemporal dementia: Secondary | ICD-10-CM | POA: Diagnosis not present

## 2023-04-07 DIAGNOSIS — F39 Unspecified mood [affective] disorder: Secondary | ICD-10-CM | POA: Diagnosis not present

## 2023-04-07 DIAGNOSIS — I5040 Unspecified combined systolic (congestive) and diastolic (congestive) heart failure: Secondary | ICD-10-CM | POA: Diagnosis not present

## 2023-04-07 DIAGNOSIS — M6259 Muscle wasting and atrophy, not elsewhere classified, multiple sites: Secondary | ICD-10-CM | POA: Diagnosis not present

## 2023-04-07 DIAGNOSIS — I739 Peripheral vascular disease, unspecified: Secondary | ICD-10-CM | POA: Diagnosis not present

## 2023-04-09 DIAGNOSIS — Z89611 Acquired absence of right leg above knee: Secondary | ICD-10-CM | POA: Diagnosis not present

## 2023-04-09 DIAGNOSIS — Z8673 Personal history of transient ischemic attack (TIA), and cerebral infarction without residual deficits: Secondary | ICD-10-CM | POA: Diagnosis not present

## 2023-04-09 DIAGNOSIS — I739 Peripheral vascular disease, unspecified: Secondary | ICD-10-CM | POA: Diagnosis not present

## 2023-04-09 DIAGNOSIS — M1990 Unspecified osteoarthritis, unspecified site: Secondary | ICD-10-CM | POA: Diagnosis not present

## 2023-04-09 DIAGNOSIS — R627 Adult failure to thrive: Secondary | ICD-10-CM | POA: Diagnosis not present

## 2023-04-09 DIAGNOSIS — S2249XD Multiple fractures of ribs, unspecified side, subsequent encounter for fracture with routine healing: Secondary | ICD-10-CM | POA: Diagnosis not present

## 2023-04-09 DIAGNOSIS — G3109 Other frontotemporal dementia: Secondary | ICD-10-CM | POA: Diagnosis not present

## 2023-04-09 DIAGNOSIS — S2249XA Multiple fractures of ribs, unspecified side, initial encounter for closed fracture: Secondary | ICD-10-CM | POA: Diagnosis not present

## 2023-04-09 DIAGNOSIS — M6259 Muscle wasting and atrophy, not elsewhere classified, multiple sites: Secondary | ICD-10-CM | POA: Diagnosis not present

## 2023-04-10 DIAGNOSIS — I739 Peripheral vascular disease, unspecified: Secondary | ICD-10-CM | POA: Diagnosis not present

## 2023-04-10 DIAGNOSIS — R627 Adult failure to thrive: Secondary | ICD-10-CM | POA: Diagnosis not present

## 2023-04-10 DIAGNOSIS — M6259 Muscle wasting and atrophy, not elsewhere classified, multiple sites: Secondary | ICD-10-CM | POA: Diagnosis not present

## 2023-04-10 DIAGNOSIS — G3109 Other frontotemporal dementia: Secondary | ICD-10-CM | POA: Diagnosis not present

## 2023-04-10 DIAGNOSIS — Z89611 Acquired absence of right leg above knee: Secondary | ICD-10-CM | POA: Diagnosis not present

## 2023-04-10 DIAGNOSIS — M1990 Unspecified osteoarthritis, unspecified site: Secondary | ICD-10-CM | POA: Diagnosis not present

## 2023-04-10 DIAGNOSIS — Z8673 Personal history of transient ischemic attack (TIA), and cerebral infarction without residual deficits: Secondary | ICD-10-CM | POA: Diagnosis not present

## 2023-04-10 DIAGNOSIS — S2249XD Multiple fractures of ribs, unspecified side, subsequent encounter for fracture with routine healing: Secondary | ICD-10-CM | POA: Diagnosis not present

## 2023-04-10 DIAGNOSIS — S2249XA Multiple fractures of ribs, unspecified side, initial encounter for closed fracture: Secondary | ICD-10-CM | POA: Diagnosis not present

## 2023-04-13 DIAGNOSIS — F0393 Unspecified dementia, unspecified severity, with mood disturbance: Secondary | ICD-10-CM | POA: Diagnosis not present

## 2023-04-13 DIAGNOSIS — Z89611 Acquired absence of right leg above knee: Secondary | ICD-10-CM | POA: Diagnosis not present

## 2023-04-13 DIAGNOSIS — R627 Adult failure to thrive: Secondary | ICD-10-CM | POA: Diagnosis not present

## 2023-04-13 DIAGNOSIS — I2510 Atherosclerotic heart disease of native coronary artery without angina pectoris: Secondary | ICD-10-CM | POA: Diagnosis not present

## 2023-04-13 DIAGNOSIS — I739 Peripheral vascular disease, unspecified: Secondary | ICD-10-CM | POA: Diagnosis not present

## 2023-04-13 DIAGNOSIS — G3109 Other frontotemporal dementia: Secondary | ICD-10-CM | POA: Diagnosis not present

## 2023-04-13 DIAGNOSIS — Z8673 Personal history of transient ischemic attack (TIA), and cerebral infarction without residual deficits: Secondary | ICD-10-CM | POA: Diagnosis not present

## 2023-04-13 DIAGNOSIS — Z7902 Long term (current) use of antithrombotics/antiplatelets: Secondary | ICD-10-CM | POA: Diagnosis not present

## 2023-04-13 DIAGNOSIS — S2249XA Multiple fractures of ribs, unspecified side, initial encounter for closed fracture: Secondary | ICD-10-CM | POA: Diagnosis not present

## 2023-04-13 DIAGNOSIS — I5040 Unspecified combined systolic (congestive) and diastolic (congestive) heart failure: Secondary | ICD-10-CM | POA: Diagnosis not present

## 2023-04-13 DIAGNOSIS — I69322 Dysarthria following cerebral infarction: Secondary | ICD-10-CM | POA: Diagnosis not present

## 2023-04-13 DIAGNOSIS — M1990 Unspecified osteoarthritis, unspecified site: Secondary | ICD-10-CM | POA: Diagnosis not present

## 2023-04-13 DIAGNOSIS — I255 Ischemic cardiomyopathy: Secondary | ICD-10-CM | POA: Diagnosis not present

## 2023-04-13 DIAGNOSIS — S2249XD Multiple fractures of ribs, unspecified side, subsequent encounter for fracture with routine healing: Secondary | ICD-10-CM | POA: Diagnosis not present

## 2023-04-13 DIAGNOSIS — F32A Depression, unspecified: Secondary | ICD-10-CM | POA: Diagnosis not present

## 2023-04-13 DIAGNOSIS — M6259 Muscle wasting and atrophy, not elsewhere classified, multiple sites: Secondary | ICD-10-CM | POA: Diagnosis not present

## 2023-04-14 DIAGNOSIS — G3109 Other frontotemporal dementia: Secondary | ICD-10-CM | POA: Diagnosis not present

## 2023-04-14 DIAGNOSIS — F0394 Unspecified dementia, unspecified severity, with anxiety: Secondary | ICD-10-CM | POA: Diagnosis not present

## 2023-04-14 DIAGNOSIS — R627 Adult failure to thrive: Secondary | ICD-10-CM | POA: Diagnosis not present

## 2023-04-14 DIAGNOSIS — E119 Type 2 diabetes mellitus without complications: Secondary | ICD-10-CM | POA: Diagnosis not present

## 2023-04-14 DIAGNOSIS — F39 Unspecified mood [affective] disorder: Secondary | ICD-10-CM | POA: Diagnosis not present

## 2023-04-14 DIAGNOSIS — F0393 Unspecified dementia, unspecified severity, with mood disturbance: Secondary | ICD-10-CM | POA: Diagnosis not present

## 2023-04-14 DIAGNOSIS — Z89611 Acquired absence of right leg above knee: Secondary | ICD-10-CM | POA: Diagnosis not present

## 2023-04-14 DIAGNOSIS — I739 Peripheral vascular disease, unspecified: Secondary | ICD-10-CM | POA: Diagnosis not present

## 2023-04-14 DIAGNOSIS — Z8673 Personal history of transient ischemic attack (TIA), and cerebral infarction without residual deficits: Secondary | ICD-10-CM | POA: Diagnosis not present

## 2023-04-14 DIAGNOSIS — S2249XD Multiple fractures of ribs, unspecified side, subsequent encounter for fracture with routine healing: Secondary | ICD-10-CM | POA: Diagnosis not present

## 2023-04-14 DIAGNOSIS — E559 Vitamin D deficiency, unspecified: Secondary | ICD-10-CM | POA: Diagnosis not present

## 2023-04-14 DIAGNOSIS — F03911 Unspecified dementia, unspecified severity, with agitation: Secondary | ICD-10-CM | POA: Diagnosis not present

## 2023-04-14 DIAGNOSIS — S2249XA Multiple fractures of ribs, unspecified side, initial encounter for closed fracture: Secondary | ICD-10-CM | POA: Diagnosis not present

## 2023-04-14 DIAGNOSIS — M1990 Unspecified osteoarthritis, unspecified site: Secondary | ICD-10-CM | POA: Diagnosis not present

## 2023-04-14 DIAGNOSIS — I10 Essential (primary) hypertension: Secondary | ICD-10-CM | POA: Diagnosis not present

## 2023-04-14 DIAGNOSIS — M6259 Muscle wasting and atrophy, not elsewhere classified, multiple sites: Secondary | ICD-10-CM | POA: Diagnosis not present

## 2023-04-14 DIAGNOSIS — I119 Hypertensive heart disease without heart failure: Secondary | ICD-10-CM | POA: Diagnosis not present

## 2023-04-16 DIAGNOSIS — M1990 Unspecified osteoarthritis, unspecified site: Secondary | ICD-10-CM | POA: Diagnosis not present

## 2023-04-16 DIAGNOSIS — M6259 Muscle wasting and atrophy, not elsewhere classified, multiple sites: Secondary | ICD-10-CM | POA: Diagnosis not present

## 2023-04-16 DIAGNOSIS — S2249XA Multiple fractures of ribs, unspecified side, initial encounter for closed fracture: Secondary | ICD-10-CM | POA: Diagnosis not present

## 2023-04-16 DIAGNOSIS — I739 Peripheral vascular disease, unspecified: Secondary | ICD-10-CM | POA: Diagnosis not present

## 2023-04-16 DIAGNOSIS — Z8673 Personal history of transient ischemic attack (TIA), and cerebral infarction without residual deficits: Secondary | ICD-10-CM | POA: Diagnosis not present

## 2023-04-16 DIAGNOSIS — G3109 Other frontotemporal dementia: Secondary | ICD-10-CM | POA: Diagnosis not present

## 2023-04-16 DIAGNOSIS — R627 Adult failure to thrive: Secondary | ICD-10-CM | POA: Diagnosis not present

## 2023-04-16 DIAGNOSIS — Z89611 Acquired absence of right leg above knee: Secondary | ICD-10-CM | POA: Diagnosis not present

## 2023-04-16 DIAGNOSIS — S2249XD Multiple fractures of ribs, unspecified side, subsequent encounter for fracture with routine healing: Secondary | ICD-10-CM | POA: Diagnosis not present

## 2023-04-20 DIAGNOSIS — F02B3 Dementia in other diseases classified elsewhere, moderate, with mood disturbance: Secondary | ICD-10-CM | POA: Diagnosis not present

## 2023-04-20 DIAGNOSIS — F411 Generalized anxiety disorder: Secondary | ICD-10-CM | POA: Diagnosis not present

## 2023-04-20 DIAGNOSIS — M1990 Unspecified osteoarthritis, unspecified site: Secondary | ICD-10-CM | POA: Diagnosis not present

## 2023-04-20 DIAGNOSIS — Z8673 Personal history of transient ischemic attack (TIA), and cerebral infarction without residual deficits: Secondary | ICD-10-CM | POA: Diagnosis not present

## 2023-04-20 DIAGNOSIS — R627 Adult failure to thrive: Secondary | ICD-10-CM | POA: Diagnosis not present

## 2023-04-20 DIAGNOSIS — F32A Depression, unspecified: Secondary | ICD-10-CM | POA: Diagnosis not present

## 2023-04-20 DIAGNOSIS — S2249XD Multiple fractures of ribs, unspecified side, subsequent encounter for fracture with routine healing: Secondary | ICD-10-CM | POA: Diagnosis not present

## 2023-04-20 DIAGNOSIS — G3109 Other frontotemporal dementia: Secondary | ICD-10-CM | POA: Diagnosis not present

## 2023-04-20 DIAGNOSIS — F4310 Post-traumatic stress disorder, unspecified: Secondary | ICD-10-CM | POA: Diagnosis not present

## 2023-04-20 DIAGNOSIS — S2249XA Multiple fractures of ribs, unspecified side, initial encounter for closed fracture: Secondary | ICD-10-CM | POA: Diagnosis not present

## 2023-04-20 DIAGNOSIS — Z89611 Acquired absence of right leg above knee: Secondary | ICD-10-CM | POA: Diagnosis not present

## 2023-04-20 DIAGNOSIS — M6259 Muscle wasting and atrophy, not elsewhere classified, multiple sites: Secondary | ICD-10-CM | POA: Diagnosis not present

## 2023-04-20 DIAGNOSIS — I739 Peripheral vascular disease, unspecified: Secondary | ICD-10-CM | POA: Diagnosis not present

## 2023-04-20 DIAGNOSIS — F5101 Primary insomnia: Secondary | ICD-10-CM | POA: Diagnosis not present

## 2023-04-24 DIAGNOSIS — R293 Abnormal posture: Secondary | ICD-10-CM | POA: Diagnosis not present

## 2023-04-24 DIAGNOSIS — F0280 Dementia in other diseases classified elsewhere without behavioral disturbance: Secondary | ICD-10-CM | POA: Diagnosis not present

## 2023-04-24 DIAGNOSIS — R1311 Dysphagia, oral phase: Secondary | ICD-10-CM | POA: Diagnosis not present

## 2023-04-24 DIAGNOSIS — G3109 Other frontotemporal dementia: Secondary | ICD-10-CM | POA: Diagnosis not present

## 2023-04-24 DIAGNOSIS — M6281 Muscle weakness (generalized): Secondary | ICD-10-CM | POA: Diagnosis not present

## 2023-04-24 DIAGNOSIS — R41841 Cognitive communication deficit: Secondary | ICD-10-CM | POA: Diagnosis not present

## 2023-04-27 DIAGNOSIS — F0280 Dementia in other diseases classified elsewhere without behavioral disturbance: Secondary | ICD-10-CM | POA: Diagnosis not present

## 2023-04-27 DIAGNOSIS — R41841 Cognitive communication deficit: Secondary | ICD-10-CM | POA: Diagnosis not present

## 2023-04-27 DIAGNOSIS — R293 Abnormal posture: Secondary | ICD-10-CM | POA: Diagnosis not present

## 2023-04-27 DIAGNOSIS — R1311 Dysphagia, oral phase: Secondary | ICD-10-CM | POA: Diagnosis not present

## 2023-04-27 DIAGNOSIS — G3109 Other frontotemporal dementia: Secondary | ICD-10-CM | POA: Diagnosis not present

## 2023-04-27 DIAGNOSIS — M6281 Muscle weakness (generalized): Secondary | ICD-10-CM | POA: Diagnosis not present

## 2023-04-28 DIAGNOSIS — Z89611 Acquired absence of right leg above knee: Secondary | ICD-10-CM | POA: Diagnosis not present

## 2023-04-28 DIAGNOSIS — F0390 Unspecified dementia without behavioral disturbance: Secondary | ICD-10-CM | POA: Diagnosis not present

## 2023-04-28 DIAGNOSIS — F32A Depression, unspecified: Secondary | ICD-10-CM | POA: Diagnosis not present

## 2023-04-28 DIAGNOSIS — I110 Hypertensive heart disease with heart failure: Secondary | ICD-10-CM | POA: Diagnosis not present

## 2023-04-28 DIAGNOSIS — W19XXXA Unspecified fall, initial encounter: Secondary | ICD-10-CM | POA: Diagnosis not present

## 2023-04-28 DIAGNOSIS — I5040 Unspecified combined systolic (congestive) and diastolic (congestive) heart failure: Secondary | ICD-10-CM | POA: Diagnosis not present

## 2023-04-28 DIAGNOSIS — F39 Unspecified mood [affective] disorder: Secondary | ICD-10-CM | POA: Diagnosis not present

## 2023-04-29 DIAGNOSIS — R293 Abnormal posture: Secondary | ICD-10-CM | POA: Diagnosis not present

## 2023-04-29 DIAGNOSIS — G3109 Other frontotemporal dementia: Secondary | ICD-10-CM | POA: Diagnosis not present

## 2023-04-29 DIAGNOSIS — M6281 Muscle weakness (generalized): Secondary | ICD-10-CM | POA: Diagnosis not present

## 2023-04-29 DIAGNOSIS — R41841 Cognitive communication deficit: Secondary | ICD-10-CM | POA: Diagnosis not present

## 2023-04-29 DIAGNOSIS — F0280 Dementia in other diseases classified elsewhere without behavioral disturbance: Secondary | ICD-10-CM | POA: Diagnosis not present

## 2023-04-29 DIAGNOSIS — R1311 Dysphagia, oral phase: Secondary | ICD-10-CM | POA: Diagnosis not present

## 2023-04-30 DIAGNOSIS — M6281 Muscle weakness (generalized): Secondary | ICD-10-CM | POA: Diagnosis not present

## 2023-04-30 DIAGNOSIS — R1311 Dysphagia, oral phase: Secondary | ICD-10-CM | POA: Diagnosis not present

## 2023-04-30 DIAGNOSIS — R293 Abnormal posture: Secondary | ICD-10-CM | POA: Diagnosis not present

## 2023-04-30 DIAGNOSIS — R41841 Cognitive communication deficit: Secondary | ICD-10-CM | POA: Diagnosis not present

## 2023-04-30 DIAGNOSIS — G3109 Other frontotemporal dementia: Secondary | ICD-10-CM | POA: Diagnosis not present

## 2023-04-30 DIAGNOSIS — F0280 Dementia in other diseases classified elsewhere without behavioral disturbance: Secondary | ICD-10-CM | POA: Diagnosis not present

## 2023-05-02 DIAGNOSIS — Z79899 Other long term (current) drug therapy: Secondary | ICD-10-CM | POA: Diagnosis not present

## 2023-05-04 DIAGNOSIS — G3109 Other frontotemporal dementia: Secondary | ICD-10-CM | POA: Diagnosis not present

## 2023-05-04 DIAGNOSIS — R41841 Cognitive communication deficit: Secondary | ICD-10-CM | POA: Diagnosis not present

## 2023-05-04 DIAGNOSIS — R1311 Dysphagia, oral phase: Secondary | ICD-10-CM | POA: Diagnosis not present

## 2023-05-04 DIAGNOSIS — F0280 Dementia in other diseases classified elsewhere without behavioral disturbance: Secondary | ICD-10-CM | POA: Diagnosis not present

## 2023-05-04 DIAGNOSIS — R293 Abnormal posture: Secondary | ICD-10-CM | POA: Diagnosis not present

## 2023-05-04 DIAGNOSIS — M6281 Muscle weakness (generalized): Secondary | ICD-10-CM | POA: Diagnosis not present

## 2023-05-05 DIAGNOSIS — R293 Abnormal posture: Secondary | ICD-10-CM | POA: Diagnosis not present

## 2023-05-05 DIAGNOSIS — F0280 Dementia in other diseases classified elsewhere without behavioral disturbance: Secondary | ICD-10-CM | POA: Diagnosis not present

## 2023-05-05 DIAGNOSIS — M6281 Muscle weakness (generalized): Secondary | ICD-10-CM | POA: Diagnosis not present

## 2023-05-05 DIAGNOSIS — R41841 Cognitive communication deficit: Secondary | ICD-10-CM | POA: Diagnosis not present

## 2023-05-05 DIAGNOSIS — R1311 Dysphagia, oral phase: Secondary | ICD-10-CM | POA: Diagnosis not present

## 2023-05-05 DIAGNOSIS — G3109 Other frontotemporal dementia: Secondary | ICD-10-CM | POA: Diagnosis not present

## 2023-05-06 DIAGNOSIS — R293 Abnormal posture: Secondary | ICD-10-CM | POA: Diagnosis not present

## 2023-05-06 DIAGNOSIS — R41841 Cognitive communication deficit: Secondary | ICD-10-CM | POA: Diagnosis not present

## 2023-05-06 DIAGNOSIS — M6281 Muscle weakness (generalized): Secondary | ICD-10-CM | POA: Diagnosis not present

## 2023-05-06 DIAGNOSIS — R1311 Dysphagia, oral phase: Secondary | ICD-10-CM | POA: Diagnosis not present

## 2023-05-06 DIAGNOSIS — F0280 Dementia in other diseases classified elsewhere without behavioral disturbance: Secondary | ICD-10-CM | POA: Diagnosis not present

## 2023-05-06 DIAGNOSIS — G3109 Other frontotemporal dementia: Secondary | ICD-10-CM | POA: Diagnosis not present

## 2023-05-07 DIAGNOSIS — I255 Ischemic cardiomyopathy: Secondary | ICD-10-CM | POA: Diagnosis not present

## 2023-05-07 DIAGNOSIS — R635 Abnormal weight gain: Secondary | ICD-10-CM | POA: Diagnosis not present

## 2023-05-07 DIAGNOSIS — F0393 Unspecified dementia, unspecified severity, with mood disturbance: Secondary | ICD-10-CM | POA: Diagnosis not present

## 2023-05-07 DIAGNOSIS — M6281 Muscle weakness (generalized): Secondary | ICD-10-CM | POA: Diagnosis not present

## 2023-05-07 DIAGNOSIS — F32A Depression, unspecified: Secondary | ICD-10-CM | POA: Diagnosis not present

## 2023-05-07 DIAGNOSIS — I5040 Unspecified combined systolic (congestive) and diastolic (congestive) heart failure: Secondary | ICD-10-CM | POA: Diagnosis not present

## 2023-05-07 DIAGNOSIS — F0280 Dementia in other diseases classified elsewhere without behavioral disturbance: Secondary | ICD-10-CM | POA: Diagnosis not present

## 2023-05-07 DIAGNOSIS — G3109 Other frontotemporal dementia: Secondary | ICD-10-CM | POA: Diagnosis not present

## 2023-05-07 DIAGNOSIS — R41841 Cognitive communication deficit: Secondary | ICD-10-CM | POA: Diagnosis not present

## 2023-05-07 DIAGNOSIS — R1311 Dysphagia, oral phase: Secondary | ICD-10-CM | POA: Diagnosis not present

## 2023-05-07 DIAGNOSIS — R293 Abnormal posture: Secondary | ICD-10-CM | POA: Diagnosis not present

## 2023-05-08 DIAGNOSIS — M6281 Muscle weakness (generalized): Secondary | ICD-10-CM | POA: Diagnosis not present

## 2023-05-08 DIAGNOSIS — R293 Abnormal posture: Secondary | ICD-10-CM | POA: Diagnosis not present

## 2023-05-08 DIAGNOSIS — R41841 Cognitive communication deficit: Secondary | ICD-10-CM | POA: Diagnosis not present

## 2023-05-08 DIAGNOSIS — R1311 Dysphagia, oral phase: Secondary | ICD-10-CM | POA: Diagnosis not present

## 2023-05-08 DIAGNOSIS — F0280 Dementia in other diseases classified elsewhere without behavioral disturbance: Secondary | ICD-10-CM | POA: Diagnosis not present

## 2023-05-08 DIAGNOSIS — G3109 Other frontotemporal dementia: Secondary | ICD-10-CM | POA: Diagnosis not present

## 2023-05-11 DIAGNOSIS — R41841 Cognitive communication deficit: Secondary | ICD-10-CM | POA: Diagnosis not present

## 2023-05-11 DIAGNOSIS — R293 Abnormal posture: Secondary | ICD-10-CM | POA: Diagnosis not present

## 2023-05-11 DIAGNOSIS — R1311 Dysphagia, oral phase: Secondary | ICD-10-CM | POA: Diagnosis not present

## 2023-05-11 DIAGNOSIS — G3109 Other frontotemporal dementia: Secondary | ICD-10-CM | POA: Diagnosis not present

## 2023-05-11 DIAGNOSIS — M6281 Muscle weakness (generalized): Secondary | ICD-10-CM | POA: Diagnosis not present

## 2023-05-11 DIAGNOSIS — F0280 Dementia in other diseases classified elsewhere without behavioral disturbance: Secondary | ICD-10-CM | POA: Diagnosis not present

## 2023-05-12 DIAGNOSIS — F0390 Unspecified dementia without behavioral disturbance: Secondary | ICD-10-CM | POA: Diagnosis not present

## 2023-05-12 DIAGNOSIS — F1320 Sedative, hypnotic or anxiolytic dependence, uncomplicated: Secondary | ICD-10-CM | POA: Diagnosis not present

## 2023-05-12 DIAGNOSIS — Z89611 Acquired absence of right leg above knee: Secondary | ICD-10-CM | POA: Diagnosis not present

## 2023-05-12 DIAGNOSIS — F39 Unspecified mood [affective] disorder: Secondary | ICD-10-CM | POA: Diagnosis not present

## 2023-05-13 DIAGNOSIS — G3109 Other frontotemporal dementia: Secondary | ICD-10-CM | POA: Diagnosis not present

## 2023-05-13 DIAGNOSIS — R41841 Cognitive communication deficit: Secondary | ICD-10-CM | POA: Diagnosis not present

## 2023-05-13 DIAGNOSIS — F0280 Dementia in other diseases classified elsewhere without behavioral disturbance: Secondary | ICD-10-CM | POA: Diagnosis not present

## 2023-05-13 DIAGNOSIS — R1311 Dysphagia, oral phase: Secondary | ICD-10-CM | POA: Diagnosis not present

## 2023-05-13 DIAGNOSIS — M6281 Muscle weakness (generalized): Secondary | ICD-10-CM | POA: Diagnosis not present

## 2023-05-13 DIAGNOSIS — R293 Abnormal posture: Secondary | ICD-10-CM | POA: Diagnosis not present

## 2023-05-14 DIAGNOSIS — R41841 Cognitive communication deficit: Secondary | ICD-10-CM | POA: Diagnosis not present

## 2023-05-14 DIAGNOSIS — R293 Abnormal posture: Secondary | ICD-10-CM | POA: Diagnosis not present

## 2023-05-14 DIAGNOSIS — M6281 Muscle weakness (generalized): Secondary | ICD-10-CM | POA: Diagnosis not present

## 2023-05-14 DIAGNOSIS — G3109 Other frontotemporal dementia: Secondary | ICD-10-CM | POA: Diagnosis not present

## 2023-05-14 DIAGNOSIS — R1311 Dysphagia, oral phase: Secondary | ICD-10-CM | POA: Diagnosis not present

## 2023-05-14 DIAGNOSIS — F0280 Dementia in other diseases classified elsewhere without behavioral disturbance: Secondary | ICD-10-CM | POA: Diagnosis not present

## 2023-05-19 DIAGNOSIS — F0280 Dementia in other diseases classified elsewhere without behavioral disturbance: Secondary | ICD-10-CM | POA: Diagnosis not present

## 2023-05-19 DIAGNOSIS — M6281 Muscle weakness (generalized): Secondary | ICD-10-CM | POA: Diagnosis not present

## 2023-05-19 DIAGNOSIS — G3109 Other frontotemporal dementia: Secondary | ICD-10-CM | POA: Diagnosis not present

## 2023-05-19 DIAGNOSIS — R1311 Dysphagia, oral phase: Secondary | ICD-10-CM | POA: Diagnosis not present

## 2023-05-19 DIAGNOSIS — R41841 Cognitive communication deficit: Secondary | ICD-10-CM | POA: Diagnosis not present

## 2023-05-19 DIAGNOSIS — R293 Abnormal posture: Secondary | ICD-10-CM | POA: Diagnosis not present

## 2023-05-20 DIAGNOSIS — R41841 Cognitive communication deficit: Secondary | ICD-10-CM | POA: Diagnosis not present

## 2023-05-20 DIAGNOSIS — R269 Unspecified abnormalities of gait and mobility: Secondary | ICD-10-CM | POA: Diagnosis not present

## 2023-05-20 DIAGNOSIS — F39 Unspecified mood [affective] disorder: Secondary | ICD-10-CM | POA: Diagnosis not present

## 2023-05-20 DIAGNOSIS — R1311 Dysphagia, oral phase: Secondary | ICD-10-CM | POA: Diagnosis not present

## 2023-05-20 DIAGNOSIS — Z4781 Encounter for orthopedic aftercare following surgical amputation: Secondary | ICD-10-CM | POA: Diagnosis not present

## 2023-05-20 DIAGNOSIS — Z79899 Other long term (current) drug therapy: Secondary | ICD-10-CM | POA: Diagnosis not present

## 2023-05-20 DIAGNOSIS — F1320 Sedative, hypnotic or anxiolytic dependence, uncomplicated: Secondary | ICD-10-CM | POA: Diagnosis not present

## 2023-05-20 DIAGNOSIS — R293 Abnormal posture: Secondary | ICD-10-CM | POA: Diagnosis not present

## 2023-05-20 DIAGNOSIS — F0280 Dementia in other diseases classified elsewhere without behavioral disturbance: Secondary | ICD-10-CM | POA: Diagnosis not present

## 2023-05-20 DIAGNOSIS — G3109 Other frontotemporal dementia: Secondary | ICD-10-CM | POA: Diagnosis not present

## 2023-05-20 DIAGNOSIS — I255 Ischemic cardiomyopathy: Secondary | ICD-10-CM | POA: Diagnosis not present

## 2023-05-20 DIAGNOSIS — I2510 Atherosclerotic heart disease of native coronary artery without angina pectoris: Secondary | ICD-10-CM | POA: Diagnosis not present

## 2023-05-20 DIAGNOSIS — I5040 Unspecified combined systolic (congestive) and diastolic (congestive) heart failure: Secondary | ICD-10-CM | POA: Diagnosis not present

## 2023-05-20 DIAGNOSIS — E88819 Insulin resistance, unspecified: Secondary | ICD-10-CM | POA: Diagnosis not present

## 2023-05-20 DIAGNOSIS — I252 Old myocardial infarction: Secondary | ICD-10-CM | POA: Diagnosis not present

## 2023-05-20 DIAGNOSIS — Z89611 Acquired absence of right leg above knee: Secondary | ICD-10-CM | POA: Diagnosis not present

## 2023-05-20 DIAGNOSIS — R197 Diarrhea, unspecified: Secondary | ICD-10-CM | POA: Diagnosis not present

## 2023-05-20 DIAGNOSIS — M6281 Muscle weakness (generalized): Secondary | ICD-10-CM | POA: Diagnosis not present

## 2023-05-20 DIAGNOSIS — F0390 Unspecified dementia without behavioral disturbance: Secondary | ICD-10-CM | POA: Diagnosis not present

## 2023-05-20 DIAGNOSIS — Z7902 Long term (current) use of antithrombotics/antiplatelets: Secondary | ICD-10-CM | POA: Diagnosis not present

## 2023-05-20 DIAGNOSIS — I739 Peripheral vascular disease, unspecified: Secondary | ICD-10-CM | POA: Diagnosis not present

## 2023-05-20 DIAGNOSIS — Z951 Presence of aortocoronary bypass graft: Secondary | ICD-10-CM | POA: Diagnosis not present

## 2023-05-21 DIAGNOSIS — M6281 Muscle weakness (generalized): Secondary | ICD-10-CM | POA: Diagnosis not present

## 2023-05-21 DIAGNOSIS — F0280 Dementia in other diseases classified elsewhere without behavioral disturbance: Secondary | ICD-10-CM | POA: Diagnosis not present

## 2023-05-21 DIAGNOSIS — R1311 Dysphagia, oral phase: Secondary | ICD-10-CM | POA: Diagnosis not present

## 2023-05-21 DIAGNOSIS — R41841 Cognitive communication deficit: Secondary | ICD-10-CM | POA: Diagnosis not present

## 2023-05-21 DIAGNOSIS — G3109 Other frontotemporal dementia: Secondary | ICD-10-CM | POA: Diagnosis not present

## 2023-05-21 DIAGNOSIS — R293 Abnormal posture: Secondary | ICD-10-CM | POA: Diagnosis not present

## 2023-05-22 DIAGNOSIS — F0280 Dementia in other diseases classified elsewhere without behavioral disturbance: Secondary | ICD-10-CM | POA: Diagnosis not present

## 2023-05-22 DIAGNOSIS — R41841 Cognitive communication deficit: Secondary | ICD-10-CM | POA: Diagnosis not present

## 2023-05-22 DIAGNOSIS — M6281 Muscle weakness (generalized): Secondary | ICD-10-CM | POA: Diagnosis not present

## 2023-05-22 DIAGNOSIS — G3109 Other frontotemporal dementia: Secondary | ICD-10-CM | POA: Diagnosis not present

## 2023-05-22 DIAGNOSIS — R293 Abnormal posture: Secondary | ICD-10-CM | POA: Diagnosis not present

## 2023-05-22 DIAGNOSIS — R1311 Dysphagia, oral phase: Secondary | ICD-10-CM | POA: Diagnosis not present

## 2023-05-25 DIAGNOSIS — G3109 Other frontotemporal dementia: Secondary | ICD-10-CM | POA: Diagnosis not present

## 2023-05-25 DIAGNOSIS — M6281 Muscle weakness (generalized): Secondary | ICD-10-CM | POA: Diagnosis not present

## 2023-05-25 DIAGNOSIS — I110 Hypertensive heart disease with heart failure: Secondary | ICD-10-CM | POA: Diagnosis not present

## 2023-05-25 DIAGNOSIS — I5040 Unspecified combined systolic (congestive) and diastolic (congestive) heart failure: Secondary | ICD-10-CM | POA: Diagnosis not present

## 2023-05-25 DIAGNOSIS — R279 Unspecified lack of coordination: Secondary | ICD-10-CM | POA: Diagnosis not present

## 2023-05-25 DIAGNOSIS — R293 Abnormal posture: Secondary | ICD-10-CM | POA: Diagnosis not present

## 2023-05-25 DIAGNOSIS — F1320 Sedative, hypnotic or anxiolytic dependence, uncomplicated: Secondary | ICD-10-CM | POA: Diagnosis not present

## 2023-05-27 DIAGNOSIS — M6281 Muscle weakness (generalized): Secondary | ICD-10-CM | POA: Diagnosis not present

## 2023-05-27 DIAGNOSIS — R279 Unspecified lack of coordination: Secondary | ICD-10-CM | POA: Diagnosis not present

## 2023-05-27 DIAGNOSIS — R293 Abnormal posture: Secondary | ICD-10-CM | POA: Diagnosis not present

## 2023-05-27 DIAGNOSIS — G3109 Other frontotemporal dementia: Secondary | ICD-10-CM | POA: Diagnosis not present

## 2023-05-29 DIAGNOSIS — R293 Abnormal posture: Secondary | ICD-10-CM | POA: Diagnosis not present

## 2023-05-29 DIAGNOSIS — G3109 Other frontotemporal dementia: Secondary | ICD-10-CM | POA: Diagnosis not present

## 2023-05-29 DIAGNOSIS — M6281 Muscle weakness (generalized): Secondary | ICD-10-CM | POA: Diagnosis not present

## 2023-05-29 DIAGNOSIS — R279 Unspecified lack of coordination: Secondary | ICD-10-CM | POA: Diagnosis not present

## 2023-06-02 DIAGNOSIS — R293 Abnormal posture: Secondary | ICD-10-CM | POA: Diagnosis not present

## 2023-06-02 DIAGNOSIS — R279 Unspecified lack of coordination: Secondary | ICD-10-CM | POA: Diagnosis not present

## 2023-06-02 DIAGNOSIS — M6281 Muscle weakness (generalized): Secondary | ICD-10-CM | POA: Diagnosis not present

## 2023-06-02 DIAGNOSIS — G3109 Other frontotemporal dementia: Secondary | ICD-10-CM | POA: Diagnosis not present

## 2023-06-03 DIAGNOSIS — R279 Unspecified lack of coordination: Secondary | ICD-10-CM | POA: Diagnosis not present

## 2023-06-03 DIAGNOSIS — G3109 Other frontotemporal dementia: Secondary | ICD-10-CM | POA: Diagnosis not present

## 2023-06-03 DIAGNOSIS — R293 Abnormal posture: Secondary | ICD-10-CM | POA: Diagnosis not present

## 2023-06-03 DIAGNOSIS — M6281 Muscle weakness (generalized): Secondary | ICD-10-CM | POA: Diagnosis not present

## 2023-06-10 DIAGNOSIS — R293 Abnormal posture: Secondary | ICD-10-CM | POA: Diagnosis not present

## 2023-06-10 DIAGNOSIS — G3109 Other frontotemporal dementia: Secondary | ICD-10-CM | POA: Diagnosis not present

## 2023-06-10 DIAGNOSIS — R279 Unspecified lack of coordination: Secondary | ICD-10-CM | POA: Diagnosis not present

## 2023-06-10 DIAGNOSIS — M6281 Muscle weakness (generalized): Secondary | ICD-10-CM | POA: Diagnosis not present

## 2023-06-11 DIAGNOSIS — R279 Unspecified lack of coordination: Secondary | ICD-10-CM | POA: Diagnosis not present

## 2023-06-11 DIAGNOSIS — R293 Abnormal posture: Secondary | ICD-10-CM | POA: Diagnosis not present

## 2023-06-11 DIAGNOSIS — M6281 Muscle weakness (generalized): Secondary | ICD-10-CM | POA: Diagnosis not present

## 2023-06-11 DIAGNOSIS — G3109 Other frontotemporal dementia: Secondary | ICD-10-CM | POA: Diagnosis not present

## 2023-06-12 DIAGNOSIS — R293 Abnormal posture: Secondary | ICD-10-CM | POA: Diagnosis not present

## 2023-06-12 DIAGNOSIS — R279 Unspecified lack of coordination: Secondary | ICD-10-CM | POA: Diagnosis not present

## 2023-06-12 DIAGNOSIS — M6281 Muscle weakness (generalized): Secondary | ICD-10-CM | POA: Diagnosis not present

## 2023-06-12 DIAGNOSIS — G3109 Other frontotemporal dementia: Secondary | ICD-10-CM | POA: Diagnosis not present

## 2023-06-13 DIAGNOSIS — M6281 Muscle weakness (generalized): Secondary | ICD-10-CM | POA: Diagnosis not present

## 2023-06-13 DIAGNOSIS — G3109 Other frontotemporal dementia: Secondary | ICD-10-CM | POA: Diagnosis not present

## 2023-06-13 DIAGNOSIS — R279 Unspecified lack of coordination: Secondary | ICD-10-CM | POA: Diagnosis not present

## 2023-06-13 DIAGNOSIS — R293 Abnormal posture: Secondary | ICD-10-CM | POA: Diagnosis not present

## 2023-06-15 DIAGNOSIS — R293 Abnormal posture: Secondary | ICD-10-CM | POA: Diagnosis not present

## 2023-06-15 DIAGNOSIS — G3109 Other frontotemporal dementia: Secondary | ICD-10-CM | POA: Diagnosis not present

## 2023-06-15 DIAGNOSIS — M6281 Muscle weakness (generalized): Secondary | ICD-10-CM | POA: Diagnosis not present

## 2023-06-15 DIAGNOSIS — R279 Unspecified lack of coordination: Secondary | ICD-10-CM | POA: Diagnosis not present

## 2023-06-17 DIAGNOSIS — M6281 Muscle weakness (generalized): Secondary | ICD-10-CM | POA: Diagnosis not present

## 2023-06-17 DIAGNOSIS — G3109 Other frontotemporal dementia: Secondary | ICD-10-CM | POA: Diagnosis not present

## 2023-06-17 DIAGNOSIS — R293 Abnormal posture: Secondary | ICD-10-CM | POA: Diagnosis not present

## 2023-06-17 DIAGNOSIS — R279 Unspecified lack of coordination: Secondary | ICD-10-CM | POA: Diagnosis not present

## 2023-06-18 DIAGNOSIS — R279 Unspecified lack of coordination: Secondary | ICD-10-CM | POA: Diagnosis not present

## 2023-06-18 DIAGNOSIS — M6281 Muscle weakness (generalized): Secondary | ICD-10-CM | POA: Diagnosis not present

## 2023-06-18 DIAGNOSIS — G3109 Other frontotemporal dementia: Secondary | ICD-10-CM | POA: Diagnosis not present

## 2023-06-18 DIAGNOSIS — R293 Abnormal posture: Secondary | ICD-10-CM | POA: Diagnosis not present

## 2023-06-19 DIAGNOSIS — M6281 Muscle weakness (generalized): Secondary | ICD-10-CM | POA: Diagnosis not present

## 2023-06-19 DIAGNOSIS — G3109 Other frontotemporal dementia: Secondary | ICD-10-CM | POA: Diagnosis not present

## 2023-06-19 DIAGNOSIS — R279 Unspecified lack of coordination: Secondary | ICD-10-CM | POA: Diagnosis not present

## 2023-06-19 DIAGNOSIS — R293 Abnormal posture: Secondary | ICD-10-CM | POA: Diagnosis not present

## 2023-06-21 DIAGNOSIS — G3109 Other frontotemporal dementia: Secondary | ICD-10-CM | POA: Diagnosis not present

## 2023-06-21 DIAGNOSIS — M6281 Muscle weakness (generalized): Secondary | ICD-10-CM | POA: Diagnosis not present

## 2023-06-21 DIAGNOSIS — R279 Unspecified lack of coordination: Secondary | ICD-10-CM | POA: Diagnosis not present

## 2023-06-21 DIAGNOSIS — R293 Abnormal posture: Secondary | ICD-10-CM | POA: Diagnosis not present

## 2023-09-24 DIAGNOSIS — Z23 Encounter for immunization: Secondary | ICD-10-CM | POA: Diagnosis not present

## 2023-09-24 DIAGNOSIS — Z7185 Encounter for immunization safety counseling: Secondary | ICD-10-CM | POA: Diagnosis not present

## 2024-03-08 ENCOUNTER — Encounter (HOSPITAL_COMMUNITY): Payer: Self-pay

## 2024-03-08 ENCOUNTER — Emergency Department (HOSPITAL_COMMUNITY)

## 2024-03-08 ENCOUNTER — Other Ambulatory Visit: Payer: Self-pay

## 2024-03-08 ENCOUNTER — Emergency Department (HOSPITAL_COMMUNITY)
Admission: EM | Admit: 2024-03-08 | Discharge: 2024-03-08 | Disposition: A | Discharge status: Home / Self Care | Attending: Emergency Medicine | Admitting: Emergency Medicine

## 2024-03-08 DIAGNOSIS — Z7902 Long term (current) use of antithrombotics/antiplatelets: Secondary | ICD-10-CM | POA: Diagnosis not present

## 2024-03-08 DIAGNOSIS — N179 Acute kidney failure, unspecified: Secondary | ICD-10-CM | POA: Diagnosis not present

## 2024-03-08 DIAGNOSIS — Z91040 Latex allergy status: Secondary | ICD-10-CM | POA: Insufficient documentation

## 2024-03-08 DIAGNOSIS — Z79899 Other long term (current) drug therapy: Secondary | ICD-10-CM | POA: Diagnosis not present

## 2024-03-08 DIAGNOSIS — R4781 Slurred speech: Secondary | ICD-10-CM | POA: Diagnosis present

## 2024-03-08 DIAGNOSIS — H6691 Otitis media, unspecified, right ear: Secondary | ICD-10-CM | POA: Diagnosis not present

## 2024-03-08 LAB — CBC
HCT: 36.8 % — ABNORMAL LOW (ref 39.0–52.0)
Hemoglobin: 11.4 g/dL — ABNORMAL LOW (ref 13.0–17.0)
MCH: 24.7 pg — ABNORMAL LOW (ref 26.0–34.0)
MCHC: 31 g/dL (ref 30.0–36.0)
MCV: 79.8 fL — ABNORMAL LOW (ref 80.0–100.0)
Platelets: 294 10*3/uL (ref 150–400)
RBC: 4.61 MIL/uL (ref 4.22–5.81)
RDW: 16.7 % — ABNORMAL HIGH (ref 11.5–15.5)
WBC: 11 10*3/uL — ABNORMAL HIGH (ref 4.0–10.5)
nRBC: 0 % (ref 0.0–0.2)

## 2024-03-08 LAB — DIFFERENTIAL
Abs Immature Granulocytes: 0.19 10*3/uL — ABNORMAL HIGH (ref 0.00–0.07)
Basophils Absolute: 0.1 10*3/uL (ref 0.0–0.1)
Basophils Relative: 1 %
Eosinophils Absolute: 0.1 10*3/uL (ref 0.0–0.5)
Eosinophils Relative: 1 %
Immature Granulocytes: 2 %
Lymphocytes Relative: 19 %
Lymphs Abs: 2.1 10*3/uL (ref 0.7–4.0)
Monocytes Absolute: 0.8 10*3/uL (ref 0.1–1.0)
Monocytes Relative: 7 %
Neutro Abs: 7.8 10*3/uL — ABNORMAL HIGH (ref 1.7–7.7)
Neutrophils Relative %: 70 %

## 2024-03-08 LAB — COMPREHENSIVE METABOLIC PANEL
ALT: 25 U/L (ref 0–44)
AST: 44 U/L — ABNORMAL HIGH (ref 15–41)
Albumin: 3.2 g/dL — ABNORMAL LOW (ref 3.5–5.0)
Alkaline Phosphatase: 62 U/L (ref 38–126)
Anion gap: 12 (ref 5–15)
BUN: 19 mg/dL (ref 8–23)
CO2: 25 mmol/L (ref 22–32)
Calcium: 8.6 mg/dL — ABNORMAL LOW (ref 8.9–10.3)
Chloride: 101 mmol/L (ref 98–111)
Creatinine, Ser: 1.67 mg/dL — ABNORMAL HIGH (ref 0.61–1.24)
GFR, Estimated: 44 mL/min — ABNORMAL LOW (ref >60)
Glucose, Bld: 131 mg/dL — ABNORMAL HIGH (ref 70–99)
Potassium: 3.7 mmol/L (ref 3.5–5.1)
Sodium: 138 mmol/L (ref 135–145)
Total Bilirubin: 1.5 mg/dL — ABNORMAL HIGH (ref 0.0–1.2)
Total Protein: 6.8 g/dL (ref 6.5–8.1)

## 2024-03-08 LAB — I-STAT CHEM 8, ED
BUN: 23 mg/dL (ref 8–23)
Calcium, Ion: 1.04 mmol/L — ABNORMAL LOW (ref 1.15–1.40)
Chloride: 101 mmol/L (ref 98–111)
Creatinine, Ser: 1.7 mg/dL — ABNORMAL HIGH (ref 0.61–1.24)
Glucose, Bld: 125 mg/dL — ABNORMAL HIGH (ref 70–99)
HCT: 36 % — ABNORMAL LOW (ref 39.0–52.0)
Hemoglobin: 12.2 g/dL — ABNORMAL LOW (ref 13.0–17.0)
Potassium: 3.6 mmol/L (ref 3.5–5.1)
Sodium: 138 mmol/L (ref 135–145)
TCO2: 26 mmol/L (ref 22–32)

## 2024-03-08 LAB — PROTIME-INR
INR: 1.1 (ref 0.8–1.2)
Prothrombin Time: 13.9 s (ref 11.4–15.2)

## 2024-03-08 LAB — APTT: aPTT: 31 s (ref 24–36)

## 2024-03-08 LAB — ETHANOL: Alcohol, Ethyl (B): <10 mg/dL (ref <10)

## 2024-03-08 MED ORDER — AMOXICILLIN-POT CLAVULANATE 875-125 MG PO TABS: 1.0000 | ORAL_TABLET | Freq: Two times a day (BID) | ORAL | 0 refills | Status: AC

## 2024-03-08 MED ORDER — AMOXICILLIN-POT CLAVULANATE 875-125 MG PO TABS
1.0000 | ORAL_TABLET | Freq: Once | ORAL | Status: AC
  Administered 2024-03-08: 1 via ORAL
  Filled 2024-03-08: qty 1

## 2024-03-08 NOTE — ED Triage Notes (Signed)
 Per EMS, patient coming from [facility]. Per EMS, patient had slurred speech last night that the wife called babbling. Today NP at facility noted slurred speech and called EMS. LKW would be Thursday. Patient has an untreated ear infection as well. Hx of 2 strokes and dementia

## 2024-03-08 NOTE — ED Notes (Signed)
 Patient transported to CT

## 2024-03-08 NOTE — ED Provider Notes (Signed)
  Physical Exam  BP (!) 146/78 (BP Location: Right Arm)   Pulse 81   Temp 98.3 F (36.8 C) (Tympanic)   Resp 18   Ht 6\' 2"  (1.88 m)   SpO2 97%   BMI 26.04 kg/m   Physical Exam  Procedures  Procedures  ED Course / MDM    Medical Decision Making Care assumed at 3 PM.  Patient is here with trouble speaking.  Concern for possible stroke versus TIA.  Also patient has a ear infection but no antibiotics were given yet.  Signout pending MRI brain.  6:26 PM Reviewed patient's labs and independently interpreted imaging studies.  Patient's labs showed mild AKI with creatinine of 1.6.  Baseline is somewhere around 1.1.  Patient did not eat much yesterday but has no vomiting.  MRI did not show a stroke.  I examined his ear and there appears to be right otitis media.  Will discharge back to facility with a course of Augmentin.  Problems Addressed: Right otitis media, unspecified otitis media type: acute illness or injury Slurred speech: acute illness or injury  Amount and/or Complexity of Data Reviewed Labs: ordered. Decision-making details documented in ED Course. Radiology: ordered and independent interpretation performed. Decision-making details documented in ED Course.  Risk Prescription drug management.          Charlynne Pander, MD 03/08/24 [phone]

## 2024-03-08 NOTE — ED Provider Notes (Signed)
  EMERGENCY DEPARTMENT AT [facility] Provider Note   CSN: [id number] Arrival date & time: 03/08/24  1155     History  Chief Complaint  Patient presents with   Altered Mental Status    Lonnie [name] is a 73 y.o. male.  73 yo M with a chief complaint of slurred speech.  This reportedly occurred on Thursday.  Since then has had symptoms off and on since.  He tells me that he thinks is related to his ear.  Decision to send him today.  He denies headaches denied one-sided numbness or weakness he denies difficulty speech or swallowing.  He does think he is on a new medication but is not sure what it is for or why he is taking it.  He denies chest pain or difficulty breathing denies cough congestion or fever denies abdominal pain denies nausea vomiting.   Altered Mental Status      Home Medications Prior to Admission medications   Medication Sig Start Date End Date Taking? Authorizing Provider  acetaminophen (TYLENOL) 500 MG tablet Take 1,000 mg by mouth every 6 (six) hours as needed for mild pain (pain score 1-3) or moderate pain (pain score 4-6).   Yes [provider]  atorvastatin (LIPITOR) 80 MG tablet TAKE 1 TABLET EVERY DAY Patient taking differently: Take 80 mg by mouth at bedtime. 08/11/22  Yes Worthy Rancher B, FNP  clonazePAM (KLONOPIN) 0.5 MG tablet Take 0.5 mg by mouth 2 (two) times daily. 11/24/22  Yes [provider]  clopidogrel (PLAVIX) 75 MG tablet TAKE 1 TABLET EVERY DAY 08/11/22  Yes Worthy Rancher B, FNP  divalproex (DEPAKOTE SPRINKLE) 125 MG capsule Take 125-250 mg by mouth See admin instructions. Take 125 mg by mouth twice daily. Take 250 mg by mouth at bedtime.   Yes [provider]  escitalopram (LEXAPRO) 10 MG tablet Take 10 mg by mouth daily.   Yes [provider]  furosemide (LASIX) 40 MG tablet Take 40 mg by mouth daily.   Yes [provider]  gabapentin (NEURONTIN) 400 MG capsule Take 400 mg by  mouth 3 (three) times daily.   Yes [provider]  irbesartan (AVAPRO) 150 MG tablet Take 150 mg by mouth daily.   Yes [provider]  loperamide (IMODIUM A-D) 2 MG tablet Take 2 mg by mouth 3 (three) times daily as needed for diarrhea or loose stools.   Yes [provider]  Menthol, Topical Analgesic, (BIOFREEZE COOL THE PAIN) 4 % GEL Apply 2 g topically daily. Apply to lower back   Yes [provider]  metFORMIN (GLUCOPHAGE-XR) 500 MG 24 hr tablet Take 500 mg by mouth in the morning and at bedtime.   Yes [provider]  metoprolol tartrate (LOPRESSOR) 25 MG tablet TAKE 1/2 TABLET TWICE DAILY 08/11/22  Yes Worthy Rancher B, FNP  Multiple Vitamin (MULTIVITAMIN WITH MINERALS) TABS tablet Take 1 tablet by mouth in the morning.   Yes [provider]  pantoprazole (PROTONIX) 40 MG tablet TAKE 1 TABLET AT BEDTIME 08/11/22  Yes Worthy Rancher B, FNP  QUEtiapine (SEROQUEL) 100 MG tablet Take 100 mg by mouth 2 (two) times daily.   Yes [provider]  spironolactone (ALDACTONE) 25 MG tablet Take 25 mg by mouth daily.   Yes [provider]  tamsulosin (FLOMAX) 0.4 MG CAPS capsule Take 1 capsule (0.4 mg total) by mouth daily after supper. Patient taking differently: Take 0.4 mg by mouth at bedtime. 03/17/22  Yes Paz, Nolon Rod, MD  doxycycline (MONODOX) 100 MG capsule Take 100 mg by mouth 2 (two) times daily. Patient not taking: Reported on 03/08/2024    [provider]      Allergies    Ace inhibitors, Egg-derived products, Lactose, Lactose intolerance (gi), and Latex    Review of Systems   Review of Systems  Physical Exam Updated Vital Signs BP 131/70   Pulse 92   Temp 98.9 F (37.2 C) (Oral)   Resp 17   Ht 6\' 2"  (1.88 m)   SpO2 99%   BMI 26.04 kg/m  Physical Exam Vitals and nursing note reviewed.  Constitutional:      Appearance: He is well-developed.  HENT:     Head: Normocephalic and atraumatic.  Eyes:      Comments: Disconjugate gaze.  I cannot get the left eye to cross midline.  He seems to close the left eye to try and focus.  Neck:     Vascular: No JVD.  Cardiovascular:     Rate and Rhythm: Normal rate and regular rhythm.     Heart sounds: No murmur heard.    No friction rub. No gallop.  Pulmonary:     Effort: No respiratory distress.     Breath sounds: No wheezing.  Abdominal:     General: There is no distension.     Tenderness: There is no abdominal tenderness. There is no guarding or rebound.  Musculoskeletal:        General: Normal range of motion.     Cervical back: Normal range of motion and neck supple.  Skin:    Coloration: Skin is not pale.     Findings: No rash.  Neurological:     Mental Status: He is alert and oriented to person, place, and time.     Comments: Slurred speech.  Sometimes has trouble with naming.  He has some right sided weakness worse with upper than lower.  Obvious disconjugate gaze.  Psychiatric:        Behavior: Behavior normal.     ED Results / Procedures / Treatments   Labs (all labs ordered are listed, but only abnormal results are displayed) Labs Reviewed  CBC - Abnormal; Notable for the following components:      Result Value   WBC 11.0 (*)    Hemoglobin 11.4 (*)    HCT 36.8 (*)    MCV 79.8 (*)    MCH 24.7 (*)    RDW 16.7 (*)    All other components within normal limits  DIFFERENTIAL - Abnormal; Notable for the following components:   Neutro Abs 7.8 (*)    Abs Immature Granulocytes 0.19 (*)    All other components within normal limits  COMPREHENSIVE METABOLIC PANEL - Abnormal; Notable for the following components:   Glucose, Bld 131 (*)    Creatinine, Ser 1.67 (*)    Calcium 8.6 (*)    Albumin 3.2 (*)    AST 44 (*)    Total Bilirubin 1.5 (*)    GFR, Estimated 44 (*)    All other components within normal limits  I-STAT CHEM 8, ED - Abnormal; Notable for the following components:   Creatinine, Ser 1.70 (*)    Glucose, Bld 125  (*)    Calcium, Ion 1.04 (*)    Hemoglobin 12.2 (*)    HCT 36.0 (*)    All other components within normal limits  ETHANOL  PROTIME-INR  APTT  RAPID URINE DRUG SCREEN, HOSP  PERFORMED  URINALYSIS, ROUTINE W REFLEX MICROSCOPIC    EKG EKG Interpretation Date/Time:  Tuesday March 08 2024 12:08:46 EDT Ventricular Rate:  95 PR Interval:  172 QRS Duration:  119 QT Interval:  333 QTC Calculation: 419 R Axis:   -56  Text Interpretation: Sinus rhythm Incomplete left bundle branch block LVH with secondary repolarization abnormality Anterior Q waves, possibly due to LVH st changes in lateral leads, not seen on prior Otherwise no significant change Confirmed by Melene Plan [phone]) on 03/08/2024 12:26:21 PM  Radiology CT HEAD WO CONTRAST Result Date: 03/08/2024 CLINICAL DATA:  Slurred speech, last known well 5 days ago EXAM: CT HEAD WITHOUT CONTRAST TECHNIQUE: Contiguous axial images were obtained from the base of the skull through the vertex without intravenous contrast. RADIATION DOSE REDUCTION: This exam was performed according to the departmental dose-optimization program which includes automated exposure control, adjustment of the mA and/or kV according to patient size and/or use of iterative reconstruction technique. COMPARISON:  12/20/2022 FINDINGS: Brain: Stable diffuse cerebral atrophy. Extensive hypodensities throughout the periventricular white matter are again noted consistent with chronic small vessel ischemic changes. No signs of acute infarct or hemorrhage. Lateral ventricles and remaining midline structures are unremarkable. No acute extra-axial fluid collections. No mass effect. Vascular: No hyperdense vessel or unexpected calcification. Skull: Normal. Negative for fracture or focal lesion. Prior left parietal craniotomy. Sinuses/Orbits: Mucoperiosteal thickening is seen throughout the frontal, ethmoid, sphenoid, and maxillary sinuses. No superimposed gas fluid levels. Other: None.  IMPRESSION: 1. Stable head CT, no acute intracranial process. Electronically Signed   By: Sharlet Salina M.D.   On: 03/08/2024 15:05    Procedures Procedures    Medications Ordered in ED Medications - No data to display  ED Course/ Medical Decision Making/ A&P                                 Medical Decision Making Amount and/or Complexity of Data Reviewed Labs: ordered. Radiology: ordered.   72 yo M with a chief complaints of slurred speech.  This was noticed 6 days ago.  As it had not resolved he was then sent here today for evaluation.  He denies any other obvious complaints.  There may be a component of dementia.  He does have a history of stroke reportedly.  Will obtain a CT scan of the head blood work.  Will obtain to obtain further history from family.  Family has arrived they feel like the gaze has been different for about 11 years.  They are really concerned about his speech which has been different for about 5 days.  Denies trauma denies a recent medication change. Lab work without significant electrolyte abnormality, no acute anemia.  CT of the head without obvious acute change on my independent interpretation.  Plan for MRI.  Patient care was signed out to Dr. Antony Haste, please see his note for further details care in the ED.  The patients results and plan were reviewed and discussed.   Any x-rays performed were independently reviewed by myself.   Differential diagnosis were considered with the presenting HPI.  Medications - No data to display  Vitals:   03/08/24 1215 03/08/24 1300 03/08/24 1301 03/08/24 1305  BP: 131/70     Pulse:  88 92   Resp: 18 17 17    Temp:    98.9 F (37.2 C)  TempSrc:    Oral  SpO2:  97% 99%  Height:        Final diagnoses:  Slurred speech           Final Clinical Impression(s) / ED Diagnoses Final diagnoses:  Slurred speech    Rx / DC Orders ED Discharge Orders     None         Melene Plan, DO 03/08/24 1531

## 2024-03-08 NOTE — Discharge Instructions (Addendum)
 As we discussed, you have right ear infection.  Please take Augmentin twice daily for a week  You also have mild dehydration and your kidney function is slightly elevated.  Please stay hydrated and repeat kidney function in a week  Your CT and MRI did not show a stroke today  Return to ER if you have worse sore throat or ear pain or trouble speaking
